# Patient Record
Sex: Female | Born: 1940 | Hispanic: No | Smoking: Former smoker
Health system: Southern US, Community
[De-identification: ages and names within clinical notes are randomized; demographics above are authoritative.]

## PROBLEM LIST (undated history)

## (undated) DIAGNOSIS — I5032 Chronic diastolic (congestive) heart failure: Secondary | ICD-10-CM

## (undated) DIAGNOSIS — M75100 Unspecified rotator cuff tear or rupture of unspecified shoulder, not specified as traumatic: Secondary | ICD-10-CM

## (undated) DIAGNOSIS — D497 Neoplasm of unspecified behavior of endocrine glands and other parts of nervous system: Secondary | ICD-10-CM

## (undated) DIAGNOSIS — Z9889 Other specified postprocedural states: Secondary | ICD-10-CM

## (undated) DIAGNOSIS — M359 Systemic involvement of connective tissue, unspecified: Secondary | ICD-10-CM

## (undated) DIAGNOSIS — R9389 Abnormal findings on diagnostic imaging of other specified body structures: Secondary | ICD-10-CM

## (undated) DIAGNOSIS — K219 Gastro-esophageal reflux disease without esophagitis: Secondary | ICD-10-CM

## (undated) DIAGNOSIS — G4730 Sleep apnea, unspecified: Secondary | ICD-10-CM

## (undated) DIAGNOSIS — H269 Unspecified cataract: Secondary | ICD-10-CM

## (undated) DIAGNOSIS — M1990 Unspecified osteoarthritis, unspecified site: Secondary | ICD-10-CM

## (undated) DIAGNOSIS — M797 Fibromyalgia: Secondary | ICD-10-CM

## (undated) DIAGNOSIS — R7689 Other specified abnormal immunological findings in serum: Secondary | ICD-10-CM

## (undated) DIAGNOSIS — J189 Pneumonia, unspecified organism: Secondary | ICD-10-CM

## (undated) DIAGNOSIS — N893 Dysplasia of vagina, unspecified: Secondary | ICD-10-CM

## (undated) DIAGNOSIS — G43109 Migraine with aura, not intractable, without status migrainosus: Secondary | ICD-10-CM

## (undated) DIAGNOSIS — R011 Cardiac murmur, unspecified: Secondary | ICD-10-CM

## (undated) DIAGNOSIS — I2510 Atherosclerotic heart disease of native coronary artery without angina pectoris: Secondary | ICD-10-CM

## (undated) DIAGNOSIS — Z8719 Personal history of other diseases of the digestive system: Secondary | ICD-10-CM

## (undated) DIAGNOSIS — J45909 Unspecified asthma, uncomplicated: Secondary | ICD-10-CM

## (undated) DIAGNOSIS — I10 Essential (primary) hypertension: Secondary | ICD-10-CM

## (undated) DIAGNOSIS — I4891 Unspecified atrial fibrillation: Secondary | ICD-10-CM

## (undated) DIAGNOSIS — H9190 Unspecified hearing loss, unspecified ear: Secondary | ICD-10-CM

## (undated) DIAGNOSIS — G709 Myoneural disorder, unspecified: Secondary | ICD-10-CM

## (undated) DIAGNOSIS — T8859XA Other complications of anesthesia, initial encounter: Secondary | ICD-10-CM

## (undated) DIAGNOSIS — T4145XA Adverse effect of unspecified anesthetic, initial encounter: Secondary | ICD-10-CM

## (undated) DIAGNOSIS — I639 Cerebral infarction, unspecified: Secondary | ICD-10-CM

## (undated) DIAGNOSIS — C182 Malignant neoplasm of ascending colon: Secondary | ICD-10-CM

## (undated) DIAGNOSIS — C52 Malignant neoplasm of vagina: Secondary | ICD-10-CM

## (undated) DIAGNOSIS — E785 Hyperlipidemia, unspecified: Secondary | ICD-10-CM

## (undated) DIAGNOSIS — G459 Transient cerebral ischemic attack, unspecified: Secondary | ICD-10-CM

## (undated) DIAGNOSIS — G4733 Obstructive sleep apnea (adult) (pediatric): Secondary | ICD-10-CM

## (undated) DIAGNOSIS — T7840XA Allergy, unspecified, initial encounter: Secondary | ICD-10-CM

## (undated) DIAGNOSIS — B029 Zoster without complications: Secondary | ICD-10-CM

## (undated) DIAGNOSIS — R768 Other specified abnormal immunological findings in serum: Secondary | ICD-10-CM

## (undated) DIAGNOSIS — M810 Age-related osteoporosis without current pathological fracture: Secondary | ICD-10-CM

## (undated) DIAGNOSIS — E119 Type 2 diabetes mellitus without complications: Secondary | ICD-10-CM

## (undated) DIAGNOSIS — R112 Nausea with vomiting, unspecified: Secondary | ICD-10-CM

## (undated) HISTORY — DX: Atherosclerotic heart disease of native coronary artery without angina pectoris: I25.10

## (undated) HISTORY — DX: Malignant neoplasm of ascending colon: C18.2

## (undated) HISTORY — DX: Cerebral infarction, unspecified: I63.9

## (undated) HISTORY — DX: Myoneural disorder, unspecified: G70.9

## (undated) HISTORY — DX: Neoplasm of unspecified behavior of endocrine glands and other parts of nervous system: D49.7

## (undated) HISTORY — DX: Unspecified hearing loss, unspecified ear: H91.90

## (undated) HISTORY — DX: Unspecified asthma, uncomplicated: J45.909

## (undated) HISTORY — PX: CARDIAC CATHETERIZATION: SHX172

## (undated) HISTORY — DX: Allergy, unspecified, initial encounter: T78.40XA

## (undated) HISTORY — PX: BUNIONECTOMY: SHX129

## (undated) HISTORY — DX: Age-related osteoporosis without current pathological fracture: M81.0

## (undated) HISTORY — DX: Unspecified cataract: H26.9

## (undated) HISTORY — PX: EYE SURGERY: SHX253

## (undated) HISTORY — DX: Zoster without complications: B02.9

## (undated) HISTORY — DX: Essential (primary) hypertension: I10

## (undated) HISTORY — PX: THYROIDECTOMY, PARTIAL: SHX18

## (undated) HISTORY — DX: Unspecified atrial fibrillation: I48.91

## (undated) HISTORY — DX: Abnormal findings on diagnostic imaging of other specified body structures: R93.89

## (undated) HISTORY — DX: Chronic diastolic (congestive) heart failure: I50.32

## (undated) HISTORY — DX: Fibromyalgia: M79.7

## (undated) HISTORY — DX: Sleep apnea, unspecified: G47.30

## (undated) HISTORY — DX: Hyperlipidemia, unspecified: E78.5

## (undated) HISTORY — DX: Obstructive sleep apnea (adult) (pediatric): G47.33

---

## 1978-01-29 HISTORY — PX: ABDOMINAL HYSTERECTOMY: SHX81

## 1992-01-30 DIAGNOSIS — C52 Malignant neoplasm of vagina: Secondary | ICD-10-CM

## 1992-01-30 HISTORY — PX: VAGINAL MASS EXCISION: SHX2640

## 1992-01-30 HISTORY — DX: Malignant neoplasm of vagina: C52

## 1997-01-29 HISTORY — PX: BREAST BIOPSY: SHX20

## 1999-01-30 HISTORY — PX: ANTERIOR CERVICAL DECOMP/DISCECTOMY FUSION: SHX1161

## 2002-01-29 DIAGNOSIS — J45909 Unspecified asthma, uncomplicated: Secondary | ICD-10-CM

## 2002-01-29 HISTORY — DX: Unspecified asthma, uncomplicated: J45.909

## 2003-06-03 ENCOUNTER — Encounter: Admission: RE | Admit: 2003-06-03 | Discharge: 2003-06-03 | Payer: Self-pay | Admitting: Orthopedic Surgery

## 2003-11-22 ENCOUNTER — Ambulatory Visit: Payer: Self-pay | Admitting: Cardiology

## 2003-12-06 ENCOUNTER — Ambulatory Visit: Payer: Self-pay | Admitting: Cardiology

## 2003-12-14 ENCOUNTER — Encounter: Admission: RE | Admit: 2003-12-14 | Discharge: 2003-12-14 | Payer: Self-pay | Admitting: Internal Medicine

## 2003-12-16 ENCOUNTER — Encounter: Admission: RE | Admit: 2003-12-16 | Discharge: 2003-12-16 | Payer: Self-pay | Admitting: Internal Medicine

## 2003-12-17 ENCOUNTER — Encounter: Admission: RE | Admit: 2003-12-17 | Discharge: 2003-12-17 | Payer: Self-pay | Admitting: Otolaryngology

## 2004-03-30 ENCOUNTER — Ambulatory Visit: Payer: Self-pay | Admitting: Internal Medicine

## 2004-07-27 ENCOUNTER — Ambulatory Visit: Payer: Self-pay | Admitting: Internal Medicine

## 2004-07-28 ENCOUNTER — Ambulatory Visit: Payer: Self-pay | Admitting: Internal Medicine

## 2004-08-04 ENCOUNTER — Encounter: Admission: RE | Admit: 2004-08-04 | Discharge: 2004-08-04 | Payer: Self-pay | Admitting: Internal Medicine

## 2004-08-30 ENCOUNTER — Ambulatory Visit: Payer: Self-pay | Admitting: Gastroenterology

## 2004-09-26 ENCOUNTER — Encounter: Admission: RE | Admit: 2004-09-26 | Discharge: 2004-09-26 | Payer: Self-pay | Admitting: Internal Medicine

## 2004-09-26 ENCOUNTER — Ambulatory Visit: Payer: Self-pay | Admitting: Internal Medicine

## 2004-09-29 ENCOUNTER — Ambulatory Visit: Payer: Self-pay | Admitting: Internal Medicine

## 2004-09-29 LAB — HM COLONOSCOPY

## 2004-10-05 ENCOUNTER — Ambulatory Visit: Payer: Self-pay | Admitting: Gastroenterology

## 2004-10-20 ENCOUNTER — Ambulatory Visit: Payer: Self-pay | Admitting: Internal Medicine

## 2004-12-20 ENCOUNTER — Ambulatory Visit: Payer: Self-pay | Admitting: Internal Medicine

## 2005-01-03 ENCOUNTER — Ambulatory Visit: Payer: Self-pay | Admitting: Pulmonary Disease

## 2005-01-12 ENCOUNTER — Ambulatory Visit: Payer: Self-pay | Admitting: Internal Medicine

## 2005-01-17 ENCOUNTER — Ambulatory Visit: Payer: Self-pay | Admitting: Internal Medicine

## 2005-03-26 ENCOUNTER — Encounter: Admission: RE | Admit: 2005-03-26 | Discharge: 2005-03-26 | Payer: Self-pay | Admitting: Orthopedic Surgery

## 2005-04-05 ENCOUNTER — Encounter: Admission: RE | Admit: 2005-04-05 | Discharge: 2005-04-05 | Payer: Self-pay | Admitting: Internal Medicine

## 2005-04-09 ENCOUNTER — Ambulatory Visit: Payer: Self-pay | Admitting: Internal Medicine

## 2005-05-25 ENCOUNTER — Ambulatory Visit: Payer: Self-pay | Admitting: Internal Medicine

## 2005-11-01 ENCOUNTER — Ambulatory Visit: Payer: Self-pay | Admitting: Internal Medicine

## 2005-11-17 ENCOUNTER — Emergency Department (HOSPITAL_COMMUNITY): Admission: EM | Admit: 2005-11-17 | Discharge: 2005-11-17 | Payer: Self-pay | Admitting: Emergency Medicine

## 2005-12-06 ENCOUNTER — Ambulatory Visit: Payer: Self-pay | Admitting: Internal Medicine

## 2006-01-03 ENCOUNTER — Ambulatory Visit: Payer: Self-pay | Admitting: Internal Medicine

## 2006-01-29 DIAGNOSIS — R9389 Abnormal findings on diagnostic imaging of other specified body structures: Secondary | ICD-10-CM

## 2006-01-29 HISTORY — DX: Abnormal findings on diagnostic imaging of other specified body structures: R93.89

## 2006-02-06 ENCOUNTER — Ambulatory Visit: Payer: Self-pay | Admitting: Internal Medicine

## 2006-05-21 DIAGNOSIS — E785 Hyperlipidemia, unspecified: Secondary | ICD-10-CM | POA: Insufficient documentation

## 2006-05-21 DIAGNOSIS — E119 Type 2 diabetes mellitus without complications: Secondary | ICD-10-CM | POA: Insufficient documentation

## 2006-05-21 DIAGNOSIS — I10 Essential (primary) hypertension: Secondary | ICD-10-CM | POA: Insufficient documentation

## 2006-05-21 DIAGNOSIS — E1169 Type 2 diabetes mellitus with other specified complication: Secondary | ICD-10-CM | POA: Insufficient documentation

## 2006-06-04 ENCOUNTER — Ambulatory Visit: Payer: Self-pay | Admitting: Internal Medicine

## 2006-06-19 ENCOUNTER — Encounter: Admission: RE | Admit: 2006-06-19 | Discharge: 2006-06-19 | Payer: Self-pay | Admitting: Internal Medicine

## 2006-06-20 ENCOUNTER — Ambulatory Visit: Payer: Self-pay | Admitting: Internal Medicine

## 2006-07-03 LAB — CONVERTED CEMR LAB
ALT: 22 units/L (ref 0–40)
AST: 24 units/L (ref 0–37)
BUN: 15 mg/dL (ref 6–23)
Basophils Absolute: 0 10*3/uL (ref 0.0–0.1)
Basophils Relative: 0.4 % (ref 0.0–1.0)
CO2: 31 meq/L (ref 19–32)
Calcium: 9.3 mg/dL (ref 8.4–10.5)
Chloride: 104 meq/L (ref 96–112)
Cholesterol: 173 mg/dL (ref 0–200)
Creatinine, Ser: 0.7 mg/dL (ref 0.4–1.2)
Creatinine,U: 150.9 mg/dL
Eosinophils Absolute: 0.2 10*3/uL (ref 0.0–0.6)
Eosinophils Relative: 3.5 % (ref 0.0–5.0)
GFR calc Af Amer: 108 mL/min
GFR calc non Af Amer: 90 mL/min
Glucose, Bld: 94 mg/dL (ref 70–99)
HCT: 42.1 % (ref 36.0–46.0)
HDL: 69 mg/dL (ref >39.0)
Hemoglobin: 14.1 g/dL (ref 12.0–15.0)
Hgb A1c MFr Bld: 6.6 % — ABNORMAL HIGH (ref 4.6–6.0)
LDL Cholesterol: 85 mg/dL (ref 0–99)
Lymphocytes Relative: 45.4 % (ref 12.0–46.0)
MCHC: 33.4 g/dL (ref 30.0–36.0)
MCV: 85.5 fL (ref 78.0–100.0)
Microalb Creat Ratio: 4.6 mg/g (ref 0.0–30.0)
Microalb, Ur: 0.7 mg/dL (ref 0.0–1.9)
Monocytes Absolute: 0.4 10*3/uL (ref 0.2–0.7)
Monocytes Relative: 8.4 % (ref 3.0–11.0)
Neutro Abs: 1.9 10*3/uL (ref 1.4–7.7)
Neutrophils Relative %: 42.3 % — ABNORMAL LOW (ref 43.0–77.0)
Platelets: 191 10*3/uL (ref 150–400)
Potassium: 3.7 meq/L (ref 3.5–5.1)
RBC: 4.92 M/uL (ref 3.87–5.11)
RDW: 13.8 % (ref 11.5–14.6)
Sodium: 140 meq/L (ref 135–145)
Total CHOL/HDL Ratio: 2.5
Triglycerides: 95 mg/dL (ref 0–149)
VLDL: 19 mg/dL (ref 0–40)
WBC: 4.4 10*3/uL — ABNORMAL LOW (ref 4.5–10.5)

## 2006-10-23 ENCOUNTER — Encounter: Payer: Self-pay | Admitting: Internal Medicine

## 2006-11-12 ENCOUNTER — Ambulatory Visit: Payer: Self-pay | Admitting: Internal Medicine

## 2006-11-12 LAB — CONVERTED CEMR LAB
Bilirubin Urine: NEGATIVE
Glucose, Urine, Semiquant: NEGATIVE
Ketones, urine, test strip: NEGATIVE
Nitrite: NEGATIVE
Protein, U semiquant: NEGATIVE
Specific Gravity, Urine: 1.015
Urobilinogen, UA: NEGATIVE
WBC Urine, dipstick: NEGATIVE
pH: 5

## 2006-11-13 ENCOUNTER — Ambulatory Visit: Payer: Self-pay | Admitting: Internal Medicine

## 2006-11-13 LAB — CONVERTED CEMR LAB
BUN: 14 mg/dL (ref 6–23)
Bacteria, UA: NONE SEEN
Basophils Absolute: 0 10*3/uL (ref 0.0–0.1)
Basophils Relative: 0.6 % (ref 0.0–1.0)
CO2: 29 meq/L (ref 19–32)
Calcium: 9.3 mg/dL (ref 8.4–10.5)
Chloride: 103 meq/L (ref 96–112)
Creatinine, Ser: 0.9 mg/dL (ref 0.4–1.2)
Eosinophils Absolute: 0.1 10*3/uL (ref 0.0–0.6)
Eosinophils Relative: 3 % (ref 0.0–5.0)
GFR calc Af Amer: 81 mL/min
GFR calc non Af Amer: 67 mL/min
Glucose, Bld: 126 mg/dL — ABNORMAL HIGH (ref 70–99)
HCT: 41.2 % (ref 36.0–46.0)
Hemoglobin: 14 g/dL (ref 12.0–15.0)
Lymphocytes Relative: 44.6 % (ref 12.0–46.0)
MCHC: 34 g/dL (ref 30.0–36.0)
MCV: 83.6 fL (ref 78.0–100.0)
Monocytes Absolute: 0.4 10*3/uL (ref 0.2–0.7)
Monocytes Relative: 8.7 % (ref 3.0–11.0)
Neutro Abs: 1.8 10*3/uL (ref 1.4–7.7)
Neutrophils Relative %: 43.1 % (ref 43.0–77.0)
Platelets: 169 10*3/uL (ref 150–400)
Potassium: 3.9 meq/L (ref 3.5–5.1)
RBC / HPF: NONE SEEN (ref <3)
RBC: 4.93 M/uL (ref 3.87–5.11)
RDW: 13.3 % (ref 11.5–14.6)
Sodium: 140 meq/L (ref 135–145)
WBC, UA: NONE SEEN cells/hpf (ref <3)
WBC: 4.1 10*3/uL — ABNORMAL LOW (ref 4.5–10.5)

## 2006-11-14 ENCOUNTER — Encounter: Payer: Self-pay | Admitting: Internal Medicine

## 2006-11-14 ENCOUNTER — Telehealth (INDEPENDENT_AMBULATORY_CARE_PROVIDER_SITE_OTHER): Payer: Self-pay | Admitting: *Deleted

## 2006-11-16 ENCOUNTER — Emergency Department (HOSPITAL_COMMUNITY): Admission: EM | Admit: 2006-11-16 | Discharge: 2006-11-16 | Payer: Self-pay | Admitting: Emergency Medicine

## 2006-11-19 ENCOUNTER — Ambulatory Visit: Payer: Self-pay | Admitting: Cardiovascular Disease

## 2006-11-19 ENCOUNTER — Ambulatory Visit: Payer: Self-pay | Admitting: Internal Medicine

## 2006-11-19 DIAGNOSIS — R5383 Other fatigue: Secondary | ICD-10-CM | POA: Insufficient documentation

## 2006-11-19 DIAGNOSIS — R079 Chest pain, unspecified: Secondary | ICD-10-CM | POA: Insufficient documentation

## 2006-11-19 DIAGNOSIS — R5381 Other malaise: Secondary | ICD-10-CM | POA: Insufficient documentation

## 2006-11-19 LAB — CONVERTED CEMR LAB: Hemoglobin: 13.6 g/dL

## 2006-12-06 ENCOUNTER — Ambulatory Visit: Payer: Self-pay | Admitting: Cardiology

## 2006-12-16 ENCOUNTER — Ambulatory Visit: Payer: Self-pay

## 2006-12-16 ENCOUNTER — Encounter: Payer: Self-pay | Admitting: Cardiology

## 2006-12-16 ENCOUNTER — Ambulatory Visit: Payer: Self-pay | Admitting: Cardiology

## 2006-12-16 ENCOUNTER — Encounter: Payer: Self-pay | Admitting: Internal Medicine

## 2006-12-23 ENCOUNTER — Ambulatory Visit: Payer: Self-pay | Admitting: Cardiology

## 2007-05-07 ENCOUNTER — Telehealth (INDEPENDENT_AMBULATORY_CARE_PROVIDER_SITE_OTHER): Payer: Self-pay | Admitting: *Deleted

## 2007-05-07 ENCOUNTER — Ambulatory Visit: Payer: Self-pay | Admitting: Internal Medicine

## 2007-05-07 ENCOUNTER — Observation Stay (HOSPITAL_COMMUNITY): Admission: EM | Admit: 2007-05-07 | Discharge: 2007-05-09 | Payer: Self-pay | Admitting: Emergency Medicine

## 2007-05-23 ENCOUNTER — Ambulatory Visit: Payer: Self-pay | Admitting: Internal Medicine

## 2007-05-25 ENCOUNTER — Encounter (INDEPENDENT_AMBULATORY_CARE_PROVIDER_SITE_OTHER): Payer: Self-pay | Admitting: *Deleted

## 2007-05-25 LAB — CONVERTED CEMR LAB
ALT: 21 units/L (ref 0–35)
AST: 20 units/L (ref 0–37)
Hgb A1c MFr Bld: 6.5 % — ABNORMAL HIGH (ref 4.6–6.1)

## 2007-06-30 ENCOUNTER — Encounter (INDEPENDENT_AMBULATORY_CARE_PROVIDER_SITE_OTHER): Payer: Self-pay | Admitting: *Deleted

## 2007-07-03 ENCOUNTER — Encounter: Payer: Self-pay | Admitting: Internal Medicine

## 2007-07-22 ENCOUNTER — Telehealth: Payer: Self-pay | Admitting: Internal Medicine

## 2007-07-29 ENCOUNTER — Encounter: Admission: RE | Admit: 2007-07-29 | Discharge: 2007-07-29 | Payer: Self-pay | Admitting: Internal Medicine

## 2007-07-30 ENCOUNTER — Encounter: Admission: RE | Admit: 2007-07-30 | Discharge: 2007-07-30 | Payer: Self-pay | Admitting: Internal Medicine

## 2007-07-30 ENCOUNTER — Encounter: Payer: Self-pay | Admitting: Internal Medicine

## 2007-08-04 ENCOUNTER — Telehealth (INDEPENDENT_AMBULATORY_CARE_PROVIDER_SITE_OTHER): Payer: Self-pay | Admitting: *Deleted

## 2007-08-06 ENCOUNTER — Encounter: Admission: RE | Admit: 2007-08-06 | Discharge: 2007-08-06 | Payer: Self-pay | Admitting: Internal Medicine

## 2007-08-11 ENCOUNTER — Encounter (INDEPENDENT_AMBULATORY_CARE_PROVIDER_SITE_OTHER): Payer: Self-pay | Admitting: *Deleted

## 2007-08-15 ENCOUNTER — Ambulatory Visit: Payer: Self-pay | Admitting: Internal Medicine

## 2007-08-15 DIAGNOSIS — M797 Fibromyalgia: Secondary | ICD-10-CM | POA: Insufficient documentation

## 2007-09-16 ENCOUNTER — Telehealth (INDEPENDENT_AMBULATORY_CARE_PROVIDER_SITE_OTHER): Payer: Self-pay | Admitting: *Deleted

## 2007-09-16 ENCOUNTER — Ambulatory Visit: Payer: Self-pay | Admitting: Internal Medicine

## 2007-09-19 ENCOUNTER — Telehealth: Payer: Self-pay | Admitting: Internal Medicine

## 2007-09-19 LAB — CONVERTED CEMR LAB
BUN: 17 mg/dL (ref 6–23)
Basophils Absolute: 0.1 10*3/uL (ref 0.0–0.1)
Basophils Relative: 1.2 % (ref 0.0–3.0)
CO2: 29 meq/L (ref 19–32)
Calcium: 9.5 mg/dL (ref 8.4–10.5)
Chloride: 105 meq/L (ref 96–112)
Creatinine, Ser: 0.9 mg/dL (ref 0.4–1.2)
Eosinophils Absolute: 0.1 10*3/uL (ref 0.0–0.7)
Eosinophils Relative: 2.5 % (ref 0.0–5.0)
GFR calc Af Amer: 81 mL/min
GFR calc non Af Amer: 67 mL/min
Glucose, Bld: 91 mg/dL (ref 70–99)
HCT: 38 % (ref 36.0–46.0)
Hemoglobin: 13.3 g/dL (ref 12.0–15.0)
Hgb A1c MFr Bld: 6.5 % — ABNORMAL HIGH (ref 4.6–6.0)
Lymphocytes Relative: 41.9 % (ref 12.0–46.0)
MCHC: 35.1 g/dL (ref 30.0–36.0)
MCV: 86.4 fL (ref 78.0–100.0)
Monocytes Absolute: 0.1 10*3/uL (ref 0.1–1.0)
Monocytes Relative: 3.2 % (ref 3.0–12.0)
Neutro Abs: 2.2 10*3/uL (ref 1.4–7.7)
Neutrophils Relative %: 51.2 % (ref 43.0–77.0)
Platelets: 166 10*3/uL (ref 150–400)
Potassium: 3.7 meq/L (ref 3.5–5.1)
RBC: 4.4 M/uL (ref 3.87–5.11)
RDW: 13 % (ref 11.5–14.6)
Sodium: 140 meq/L (ref 135–145)
TSH: 2.55 microintl units/mL (ref 0.35–5.50)
WBC: 4.3 10*3/uL — ABNORMAL LOW (ref 4.5–10.5)

## 2007-09-25 ENCOUNTER — Ambulatory Visit: Payer: Self-pay | Admitting: Internal Medicine

## 2007-09-26 ENCOUNTER — Encounter (INDEPENDENT_AMBULATORY_CARE_PROVIDER_SITE_OTHER): Payer: Self-pay | Admitting: *Deleted

## 2007-09-30 ENCOUNTER — Telehealth (INDEPENDENT_AMBULATORY_CARE_PROVIDER_SITE_OTHER): Payer: Self-pay | Admitting: *Deleted

## 2007-09-30 DIAGNOSIS — G4733 Obstructive sleep apnea (adult) (pediatric): Secondary | ICD-10-CM

## 2007-09-30 HISTORY — DX: Obstructive sleep apnea (adult) (pediatric): G47.33

## 2007-09-30 LAB — CONVERTED CEMR LAB
Cholesterol: 182 mg/dL (ref 0–200)
HDL: 62 mg/dL (ref >39.0)
LDL Cholesterol: 104 mg/dL — ABNORMAL HIGH (ref 0–99)
Total CHOL/HDL Ratio: 2.9
Triglycerides: 78 mg/dL (ref 0–149)
VLDL: 16 mg/dL (ref 0–40)

## 2007-10-15 ENCOUNTER — Ambulatory Visit: Payer: Self-pay | Admitting: Pulmonary Disease

## 2007-10-15 DIAGNOSIS — G4733 Obstructive sleep apnea (adult) (pediatric): Secondary | ICD-10-CM | POA: Insufficient documentation

## 2007-11-11 ENCOUNTER — Encounter: Payer: Self-pay | Admitting: Pulmonary Disease

## 2007-11-11 ENCOUNTER — Ambulatory Visit (HOSPITAL_BASED_OUTPATIENT_CLINIC_OR_DEPARTMENT_OTHER): Admission: RE | Admit: 2007-11-11 | Discharge: 2007-11-11 | Payer: Self-pay | Admitting: Pulmonary Disease

## 2007-11-28 ENCOUNTER — Ambulatory Visit: Payer: Self-pay | Admitting: Pulmonary Disease

## 2007-12-02 ENCOUNTER — Encounter: Payer: Self-pay | Admitting: Pulmonary Disease

## 2007-12-03 ENCOUNTER — Ambulatory Visit: Payer: Self-pay | Admitting: Pulmonary Disease

## 2007-12-03 ENCOUNTER — Telehealth (INDEPENDENT_AMBULATORY_CARE_PROVIDER_SITE_OTHER): Payer: Self-pay | Admitting: *Deleted

## 2008-08-13 ENCOUNTER — Telehealth: Payer: Self-pay | Admitting: Internal Medicine

## 2008-08-19 ENCOUNTER — Encounter: Payer: Self-pay | Admitting: Internal Medicine

## 2008-08-25 ENCOUNTER — Encounter: Payer: Self-pay | Admitting: Internal Medicine

## 2008-09-28 ENCOUNTER — Ambulatory Visit: Payer: Self-pay | Admitting: Family Medicine

## 2008-09-28 DIAGNOSIS — N390 Urinary tract infection, site not specified: Secondary | ICD-10-CM | POA: Insufficient documentation

## 2008-09-28 DIAGNOSIS — N3000 Acute cystitis without hematuria: Secondary | ICD-10-CM | POA: Insufficient documentation

## 2008-09-28 LAB — CONVERTED CEMR LAB
Bilirubin Urine: NEGATIVE
Glucose, Urine, Semiquant: NEGATIVE
Ketones, urine, test strip: NEGATIVE
Nitrite: POSITIVE
Protein, U semiquant: NEGATIVE
Specific Gravity, Urine: 1.02
Urobilinogen, UA: 0.2
WBC Urine, dipstick: NEGATIVE
pH: 5

## 2008-09-29 ENCOUNTER — Encounter: Payer: Self-pay | Admitting: Family Medicine

## 2008-09-29 LAB — CONVERTED CEMR LAB: RBC / HPF: NONE SEEN (ref <3)

## 2008-10-20 ENCOUNTER — Ambulatory Visit: Payer: Self-pay | Admitting: Internal Medicine

## 2008-10-20 DIAGNOSIS — R319 Hematuria, unspecified: Secondary | ICD-10-CM | POA: Insufficient documentation

## 2008-10-20 LAB — CONVERTED CEMR LAB
Ketones, urine, test strip: NEGATIVE
Nitrite: NEGATIVE
Protein, U semiquant: NEGATIVE
Urobilinogen, UA: 0.2

## 2008-10-28 ENCOUNTER — Telehealth (INDEPENDENT_AMBULATORY_CARE_PROVIDER_SITE_OTHER): Payer: Self-pay | Admitting: *Deleted

## 2008-10-29 ENCOUNTER — Ambulatory Visit: Payer: Self-pay | Admitting: Internal Medicine

## 2008-11-08 LAB — CONVERTED CEMR LAB
ALT: 20 units/L (ref 0–35)
BUN: 13 mg/dL (ref 6–23)
Chloride: 107 meq/L (ref 96–112)
Glucose, Bld: 95 mg/dL (ref 70–99)
HDL: 64.2 mg/dL (ref >39.00)
LDL Cholesterol: 58 mg/dL (ref 0–99)
Potassium: 3.6 meq/L (ref 3.5–5.1)
Sodium: 142 meq/L (ref 135–145)
Total CHOL/HDL Ratio: 2
VLDL: 10.8 mg/dL (ref 0.0–40.0)

## 2008-11-22 ENCOUNTER — Telehealth: Payer: Self-pay | Admitting: Cardiology

## 2008-11-22 ENCOUNTER — Telehealth: Payer: Self-pay | Admitting: Internal Medicine

## 2008-11-23 ENCOUNTER — Ambulatory Visit: Payer: Self-pay | Admitting: Internal Medicine

## 2008-11-23 ENCOUNTER — Telehealth: Payer: Self-pay | Admitting: Internal Medicine

## 2008-11-23 DIAGNOSIS — R002 Palpitations: Secondary | ICD-10-CM | POA: Insufficient documentation

## 2008-11-23 LAB — CONVERTED CEMR LAB
BUN: 17 mg/dL (ref 6–23)
Basophils Absolute: 0 10*3/uL (ref 0.0–0.1)
Basophils Relative: 0.6 % (ref 0.0–3.0)
Chloride: 102 meq/L (ref 96–112)
Glucose, Bld: 80 mg/dL (ref 70–99)
HCT: 39.7 % (ref 36.0–46.0)
Hemoglobin: 13.3 g/dL (ref 12.0–15.0)
Lymphocytes Relative: 42.6 % (ref 12.0–46.0)
Lymphs Abs: 1.4 10*3/uL (ref 0.7–4.0)
MCHC: 33.5 g/dL (ref 30.0–36.0)
Monocytes Relative: 8.3 % (ref 3.0–12.0)
Neutro Abs: 1.6 10*3/uL (ref 1.4–7.7)
Potassium: 3.8 meq/L (ref 3.5–5.1)
RBC: 4.59 M/uL (ref 3.87–5.11)
RDW: 13.3 % (ref 11.5–14.6)
Sodium: 139 meq/L (ref 135–145)
TSH: 2.07 microintl units/mL (ref 0.35–5.50)

## 2008-11-24 ENCOUNTER — Ambulatory Visit: Payer: Self-pay | Admitting: Cardiology

## 2008-11-24 ENCOUNTER — Telehealth: Payer: Self-pay | Admitting: Internal Medicine

## 2008-11-26 ENCOUNTER — Telehealth: Payer: Self-pay | Admitting: Internal Medicine

## 2008-11-30 ENCOUNTER — Telehealth: Payer: Self-pay | Admitting: Internal Medicine

## 2008-12-06 DIAGNOSIS — Z87891 Personal history of nicotine dependence: Secondary | ICD-10-CM | POA: Insufficient documentation

## 2008-12-06 DIAGNOSIS — R0989 Other specified symptoms and signs involving the circulatory and respiratory systems: Secondary | ICD-10-CM

## 2008-12-06 DIAGNOSIS — R0609 Other forms of dyspnea: Secondary | ICD-10-CM | POA: Insufficient documentation

## 2008-12-16 ENCOUNTER — Ambulatory Visit: Payer: Self-pay | Admitting: Internal Medicine

## 2008-12-22 ENCOUNTER — Ambulatory Visit: Payer: Self-pay | Admitting: Cardiology

## 2009-01-06 ENCOUNTER — Ambulatory Visit (HOSPITAL_COMMUNITY): Admission: RE | Admit: 2009-01-06 | Discharge: 2009-01-06 | Payer: Self-pay | Admitting: Cardiology

## 2009-01-06 ENCOUNTER — Ambulatory Visit: Payer: Self-pay | Admitting: Cardiology

## 2009-01-06 ENCOUNTER — Encounter: Payer: Self-pay | Admitting: Cardiology

## 2009-01-06 ENCOUNTER — Ambulatory Visit: Payer: Self-pay

## 2009-03-18 ENCOUNTER — Ambulatory Visit: Payer: Self-pay | Admitting: Internal Medicine

## 2009-04-15 ENCOUNTER — Telehealth: Payer: Self-pay | Admitting: Family Medicine

## 2009-04-21 ENCOUNTER — Ambulatory Visit: Payer: Self-pay | Admitting: Internal Medicine

## 2009-05-27 ENCOUNTER — Telehealth (INDEPENDENT_AMBULATORY_CARE_PROVIDER_SITE_OTHER): Payer: Self-pay | Admitting: *Deleted

## 2009-05-27 ENCOUNTER — Ambulatory Visit: Payer: Self-pay | Admitting: Internal Medicine

## 2009-06-03 ENCOUNTER — Ambulatory Visit: Payer: Self-pay | Admitting: Pulmonary Disease

## 2009-06-15 ENCOUNTER — Ambulatory Visit: Payer: Self-pay | Admitting: Internal Medicine

## 2009-06-17 ENCOUNTER — Telehealth: Payer: Self-pay | Admitting: Internal Medicine

## 2009-06-18 LAB — CONVERTED CEMR LAB
ALT: 22 units/L (ref 0–35)
BUN: 18 mg/dL (ref 6–23)
CO2: 32 meq/L (ref 19–32)
Chloride: 101 meq/L (ref 96–112)
Creatinine, Ser: 0.9 mg/dL (ref 0.4–1.2)
Glucose, Bld: 111 mg/dL — ABNORMAL HIGH (ref 70–99)
Potassium: 4 meq/L (ref 3.5–5.1)
Total CK: 513 units/L — ABNORMAL HIGH (ref 7–177)

## 2009-06-20 ENCOUNTER — Telehealth (INDEPENDENT_AMBULATORY_CARE_PROVIDER_SITE_OTHER): Payer: Self-pay | Admitting: *Deleted

## 2009-06-20 ENCOUNTER — Emergency Department (HOSPITAL_COMMUNITY): Admission: EM | Admit: 2009-06-20 | Discharge: 2009-06-21 | Payer: Self-pay | Admitting: Emergency Medicine

## 2009-06-21 ENCOUNTER — Ambulatory Visit: Payer: Self-pay | Admitting: Internal Medicine

## 2009-06-30 ENCOUNTER — Encounter: Payer: Self-pay | Admitting: Internal Medicine

## 2009-07-06 ENCOUNTER — Ambulatory Visit: Payer: Self-pay | Admitting: Internal Medicine

## 2009-07-06 LAB — CONVERTED CEMR LAB
Basophils Absolute: 0 10*3/uL (ref 0.0–0.1)
Basophils Relative: 1 % (ref 0.0–3.0)
CO2: 30 meq/L (ref 19–32)
Calcium: 9.4 mg/dL (ref 8.4–10.5)
Chloride: 104 meq/L (ref 96–112)
Eosinophils Absolute: 0.1 10*3/uL (ref 0.0–0.7)
Glucose, Bld: 102 mg/dL — ABNORMAL HIGH (ref 70–99)
HCT: 38.7 % (ref 36.0–46.0)
Hemoglobin: 13.1 g/dL (ref 12.0–15.0)
Lymphocytes Relative: 39.1 % (ref 12.0–46.0)
Lymphs Abs: 1.7 10*3/uL (ref 0.7–4.0)
MCHC: 33.8 g/dL (ref 30.0–36.0)
MCV: 86.3 fL (ref 78.0–100.0)
Neutro Abs: 2.3 10*3/uL (ref 1.4–7.7)
Potassium: 4 meq/L (ref 3.5–5.1)
RBC: 4.48 M/uL (ref 3.87–5.11)
RDW: 14.1 % (ref 11.5–14.6)
Sodium: 141 meq/L (ref 135–145)

## 2009-07-08 ENCOUNTER — Encounter: Payer: Self-pay | Admitting: Internal Medicine

## 2009-07-12 ENCOUNTER — Ambulatory Visit: Payer: Self-pay | Admitting: Internal Medicine

## 2009-07-13 ENCOUNTER — Ambulatory Visit: Payer: Self-pay | Admitting: Pulmonary Disease

## 2009-07-14 ENCOUNTER — Telehealth (INDEPENDENT_AMBULATORY_CARE_PROVIDER_SITE_OTHER): Payer: Self-pay | Admitting: *Deleted

## 2009-07-14 LAB — CONVERTED CEMR LAB: Total CK: 438 units/L — ABNORMAL HIGH (ref 7–177)

## 2009-07-19 ENCOUNTER — Ambulatory Visit: Payer: Self-pay | Admitting: Internal Medicine

## 2009-07-19 LAB — CONVERTED CEMR LAB
Bilirubin Urine: NEGATIVE
Ketones, urine, test strip: NEGATIVE
Specific Gravity, Urine: 1.01
Urobilinogen, UA: 0.2

## 2009-07-20 ENCOUNTER — Encounter: Payer: Self-pay | Admitting: Internal Medicine

## 2009-07-20 ENCOUNTER — Ambulatory Visit: Payer: Self-pay | Admitting: Internal Medicine

## 2009-07-20 LAB — CONVERTED CEMR LAB
Casts: NONE SEEN /lpf
Crystals: NONE SEEN
RBC / HPF: NONE SEEN (ref <3)
Squamous Epithelial / LPF: NONE SEEN /lpf

## 2009-08-08 ENCOUNTER — Ambulatory Visit: Payer: Self-pay | Admitting: Internal Medicine

## 2009-08-09 ENCOUNTER — Encounter: Payer: Self-pay | Admitting: Internal Medicine

## 2009-08-09 LAB — CONVERTED CEMR LAB
Casts: NONE SEEN /lpf
RBC / HPF: NONE SEEN (ref <3)
Squamous Epithelial / LPF: NONE SEEN /lpf
WBC, UA: NONE SEEN cells/hpf (ref <3)

## 2009-08-23 ENCOUNTER — Encounter: Payer: Self-pay | Admitting: Internal Medicine

## 2009-08-24 ENCOUNTER — Ambulatory Visit: Payer: Self-pay | Admitting: Internal Medicine

## 2009-08-25 ENCOUNTER — Encounter: Payer: Self-pay | Admitting: Gastroenterology

## 2009-09-09 ENCOUNTER — Telehealth (INDEPENDENT_AMBULATORY_CARE_PROVIDER_SITE_OTHER): Payer: Self-pay | Admitting: *Deleted

## 2009-09-13 ENCOUNTER — Ambulatory Visit: Payer: Self-pay | Admitting: Internal Medicine

## 2009-09-13 LAB — CONVERTED CEMR LAB
Bilirubin Urine: NEGATIVE
Nitrite: NEGATIVE
Protein, U semiquant: NEGATIVE
Specific Gravity, Urine: 1.01

## 2009-09-14 ENCOUNTER — Encounter: Payer: Self-pay | Admitting: Internal Medicine

## 2009-09-14 LAB — CONVERTED CEMR LAB

## 2009-09-16 ENCOUNTER — Encounter: Admission: RE | Admit: 2009-09-16 | Discharge: 2009-09-16 | Payer: Self-pay | Admitting: Internal Medicine

## 2009-09-23 ENCOUNTER — Telehealth: Payer: Self-pay | Admitting: Internal Medicine

## 2009-09-29 ENCOUNTER — Emergency Department (HOSPITAL_COMMUNITY): Admission: EM | Admit: 2009-09-29 | Discharge: 2009-09-29 | Payer: Self-pay | Admitting: Family Medicine

## 2009-10-05 ENCOUNTER — Ambulatory Visit: Payer: Self-pay | Admitting: Internal Medicine

## 2009-10-05 DIAGNOSIS — M542 Cervicalgia: Secondary | ICD-10-CM | POA: Insufficient documentation

## 2009-10-12 LAB — CONVERTED CEMR LAB
Hgb A1c MFr Bld: 6.5 % (ref 4.6–6.5)
Total CK: 331 units/L — ABNORMAL HIGH (ref 7–177)

## 2009-10-14 ENCOUNTER — Encounter: Payer: Self-pay | Admitting: Internal Medicine

## 2009-10-15 ENCOUNTER — Encounter: Payer: Self-pay | Admitting: Pulmonary Disease

## 2009-10-24 ENCOUNTER — Telehealth (INDEPENDENT_AMBULATORY_CARE_PROVIDER_SITE_OTHER): Payer: Self-pay | Admitting: *Deleted

## 2009-11-02 ENCOUNTER — Telehealth: Payer: Self-pay | Admitting: Internal Medicine

## 2009-11-02 ENCOUNTER — Encounter: Payer: Self-pay | Admitting: Internal Medicine

## 2009-11-03 ENCOUNTER — Telehealth: Payer: Self-pay | Admitting: Pulmonary Disease

## 2009-11-11 ENCOUNTER — Encounter: Payer: Self-pay | Admitting: Internal Medicine

## 2009-11-15 ENCOUNTER — Encounter: Admission: RE | Admit: 2009-11-15 | Discharge: 2009-12-09 | Payer: Self-pay | Admitting: Orthopedic Surgery

## 2009-11-18 ENCOUNTER — Ambulatory Visit: Payer: Self-pay | Admitting: Internal Medicine

## 2009-11-29 DIAGNOSIS — B029 Zoster without complications: Secondary | ICD-10-CM

## 2009-11-29 HISTORY — DX: Zoster without complications: B02.9

## 2009-12-02 ENCOUNTER — Telehealth: Payer: Self-pay | Admitting: Internal Medicine

## 2009-12-09 ENCOUNTER — Encounter: Payer: Self-pay | Admitting: Internal Medicine

## 2009-12-17 ENCOUNTER — Emergency Department (HOSPITAL_COMMUNITY): Admission: EM | Admit: 2009-12-17 | Discharge: 2009-12-17 | Payer: Self-pay | Admitting: Family Medicine

## 2009-12-28 ENCOUNTER — Encounter: Payer: Self-pay | Admitting: Cardiology

## 2009-12-28 ENCOUNTER — Ambulatory Visit: Payer: Self-pay | Admitting: Cardiology

## 2010-01-02 ENCOUNTER — Ambulatory Visit: Payer: Self-pay | Admitting: Internal Medicine

## 2010-01-02 DIAGNOSIS — B0229 Other postherpetic nervous system involvement: Secondary | ICD-10-CM | POA: Insufficient documentation

## 2010-01-05 ENCOUNTER — Ambulatory Visit: Payer: Self-pay | Admitting: Cardiology

## 2010-01-06 LAB — CONVERTED CEMR LAB
Calcium: 9.4 mg/dL (ref 8.4–10.5)
GFR calc non Af Amer: 89.25 mL/min (ref >60.00)
Glucose, Bld: 110 mg/dL — ABNORMAL HIGH (ref 70–99)
Sodium: 139 meq/L (ref 135–145)

## 2010-01-25 ENCOUNTER — Ambulatory Visit: Payer: Self-pay | Admitting: Cardiology

## 2010-01-29 HISTORY — PX: CATARACT EXTRACTION W/ INTRAOCULAR LENS  IMPLANT, BILATERAL: SHX1307

## 2010-02-20 ENCOUNTER — Encounter: Payer: Self-pay | Admitting: Internal Medicine

## 2010-02-28 NOTE — Progress Notes (Signed)
Summary: swelling feet//seen 06/15/09  Phone Note Call from Patient Call back at Home Phone [phone]   Caller: Patient Summary of Call: pt c/o swelling says real bad at night time, when waking up in am swelling goes down. -After moving around feet starts swelling  during day, feet feels prickly and heavy, -Taking Hctz 25 mg 1/2 once daily, Etodolac once daily, pt says not taking Lyrica today. Seen 06/15/09 please advise Initial call taken by: Kandice Hams,  Jun 17, 2009 3:36 PM  Follow-up for Phone Call        elevate legs 30 min two times a day DM well controlled CKs elevated: hold zocor x 3 weeks recheck a CK in 3 weeks (dx myalgias) if edema continue, OV in 3 weeks  Zanaya Baize E. Lilias Lorensen MD  Jun 17, 2009 5:07 PM   discussed with pt........Marland KitchenShary Decamp  Jun 17, 2009 5:11 PM

## 2010-02-28 NOTE — Consult Note (Signed)
Summary: cervical spondylosis, stenosis--- [facility]   Imported By: Lanelle Bal 11/15/2009 15:28:51  _____________________________________________________________________  External Attachment:    Type:   Image     Comment:   External Document

## 2010-02-28 NOTE — Letter (Signed)
Summary: cysto (-), d/c qd ABX, cont  hormone cream---[facility]   Imported By: Lanelle Bal 12/23/2009 09:50:59  _____________________________________________________________________  External Attachment:    Type:   Image     Comment:   External Document

## 2010-02-28 NOTE — Assessment & Plan Note (Signed)
Summary: sinus infection//kn   Vital Signs:  Patient profile:   70 year old female Weight:      184.50 pounds O2 Sat:      98 % on Room air Temp:     98.0 degrees F oral Pulse rate:   78 / minute Pulse rhythm:   regular BP sitting:   130 / 80  (left arm) Cuff size:   regular  Vitals Entered By: Army Fossa CMA (November 18, 2009 11:20 AM)  O2 Flow:  Room air CC: Pt here c/o sinus infection?  Comments sore throat sinus drainage chest congestion  x 2-3 days [facility]   History of Present Illness: AS ABOVE  some wheezing, albuterol helps   ROS + subjective fever some PN drip  no N-V some upper ext. myalgias + small amount of green sputum   Current Medications (verified): 1)  Aspirin 81 Mg Tbec (Aspirin) .... Take One Tablet By Mouth Daily 2)  Bystolic 10 Mg  Tabs (Nebivolol Hcl) .... One Tablet Daily 3)  Hydrochlorothiazide 25 Mg Tabs (Hydrochlorothiazide) .... Take Half  Tablet By Mouth Once A Day 4)  Metformin Hcl 500 Mg Xr24h-Tab (Metformin Hcl) .... One By Mouth Daily 5)  Lyrica 100 Mg  Caps (Pregabalin) .[name].. 1 By Mouth At Bedtime 6)  Etodolac Cr 400 Mg  Tb24 (Etodolac) .... One By Mouth Daily 7)  Potassium 99 Mg Tabs (Potassium) .[name].. 1 Tab Once Daily 8)  Multivitamins   Tabs (Multiple Vitamin) .[name].. 1 Tab Once Daily 9)  Glucosamine Chondr 1500 Complx   Caps (Glucosamine-Chondroit-Vit C-Mn) .[name].. 1 Tab Once Daily 10)  Prilosec Otc 20 Mg Tbec (Omeprazole Magnesium) .... Take One 30-60 Min Before First Meal of The Day 11)  Cpap .... Wear At Bedtime 12)  Ventolin Hfa 108 (90 Base) Mcg/act Aers (Albuterol Sulfate) .... 2 Puffs Every 4 Hours As Needed Wheezing and Coughing 13)  Macrodantin 50 Mg Caps (Nitrofurantoin Macrocrystal) .... Once Daily For 1 Month 14)  Robaxin 500 Mg Tabs (Methocarbamol) .[name].. 1 By Mouth Three Times A Day As Needed 15)  Norco 5-325 Mg Tabs (Hydrocodone-Acetaminophen) .... As Needed  Allergies (verified): 1)  ! * Tramadol 2)  *  Seldane 3)  Ceftin (Cefuroxime Axetil) 4)  * Hycotuss Expectorant (Hydrocodone-Guaifenesin)  Past History:  Past Medical History: Reviewed history from 08/24/2009 and no changes required. 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 and CT chest. Had a 9 beat NSVT Fibromyalgia  abnormal CT of the chest in 2008, follow-up CT , April 2009: Unchanged.  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 partial thyroidectomy in the 60s Cervical laminectomy, in [place] , 2001  Hysterectomy Oophorectomy foot surgery neg cardiolite 10/05 barium swallow neg 2/04 EGD neg 9/06  Social History: Reviewed history from 07/06/2009 and no changes required. Married on disability since 2000 pt has children x 2  Patient states former smoker , quit in the late 90s.  ETOH--no  Physical Exam  General:  alert and well-developed.  NAD  Head:  face symetric, non tender to palpation  Ears:  R ear normal and L ear normal.   Nose:  congested  Mouth:  no red or d/c  Lungs:  normal respiratory effort, no intercostal retractions, no accessory muscle use, and normal breath sounds.   Heart:  normal rate, regular rhythm, and no murmur.     Impression & Recommendations:  Problem # 1:  URI (ICD-465.9)  see instructions  Her updated medication list for this problem includes:    Aspirin 81 Mg Tbec (Aspirin) .[name]... Take one tablet by mouth daily    Etodolac Cr 400 Mg Tb24 (Etodolac) ..... One by mouth daily  Orders: Rapid Strep (60454)  Complete Medication List: 1)  Aspirin 81 Mg Tbec (Aspirin) .... Take one tablet by  mouth daily 2)  Bystolic 10 Mg Tabs (Nebivolol hcl) .... One tablet daily 3)  Hydrochlorothiazide 25 Mg Tabs (Hydrochlorothiazide) .... Take half  tablet by mouth once a day 4)  Metformin Hcl 500 Mg Xr24h-tab (Metformin hcl) .... One by mouth daily 5)  Lyrica 100 Mg Caps (Pregabalin) .[name].. 1 by mouth at bedtime 6)  Etodolac Cr 400 Mg Tb24 (Etodolac) .... One by mouth daily 7)  Potassium 99 Mg Tabs (Potassium) .[name].. 1 tab once daily 8)  Multivitamins Tabs (Multiple vitamin) .[name].. 1 tab once daily 9)  Glucosamine Chondr 1500 Complx Caps (Glucosamine-chondroit-vit c-mn) .[name].. 1 tab once daily 10)  Prilosec Otc 20 Mg Tbec (Omeprazole magnesium) .... Take one 30-60 min before first meal of the day 11)  Cpap  .... Wear at bedtime 12)  Ventolin Hfa 108 (90 Base) Mcg/act Aers (Albuterol sulfate) .... 2 puffs every 4 hours as needed wheezing and coughing 13)  Macrodantin 50 Mg Caps (Nitrofurantoin macrocrystal) .... Once daily for 1 month 14)  Robaxin 500 Mg Tabs (Methocarbamol) .[name].. 1 by mouth three times a day as needed 15)  Norco 5-325 Mg Tabs (Hydrocodone-acetaminophen) .... As needed  Patient Instructions: 1)  rest 2)  fluids 3)  tylenol 4)  mucinex DM or robitussin DM--- OTC, as needed  5)  albuterol as needed for wheezing 6)  call if no better in few days 7)  call if symptoms severe , high fever, short of breath    Orders Added: 1)  Rapid Strep [09811] 2)  Est. Patient Level III [91478]    Laboratory Results    Other Tests  Rapid Strep: negative Comments: Army Fossa CMA  November 18, 2009 11:22 AM

## 2010-02-28 NOTE — Assessment & Plan Note (Signed)
Summary: follow-up with Dr. Salomon Mast   Vital Signs:  Patient profile:   70 year old female Height:      63 inches Weight:      186 pounds Pulse rate:   70 / minute BP sitting:   112 / 80  Vitals Entered By: Shary Decamp (Jun 21, 2009 1:37 PM) CC: rov, pt dx pt with UTI - on bactrim (urine was cx but is still pending in e-chart)   History of Present Illness: patient went to the ER yesterday complaining of weakness, aches, swelling, diarrhea and fatigue Workup at the ER 06-20-09 was  as follows:  WBCs 4.3, hemoglobin 13.4, platelets 157 Potassium 3.8, creatinine 0.9 Total CK 489 Hemoccults negative Cardiac enzymes negative x1 Urinalysis showed many bacteria, was prescribed Bactrim, urine culture pending Vital signs: BP 122/80, pulse 92  Allergies: 1)  * Seldane 2)  Ceftin (Cefuroxime Axetil) 3)  * Hycotuss Expectorant (Hydrocodone-Guaifenesin)  Review of Systems General:  in general she feels much better today Again she states that she has swelling in her ankles, worse when she stands up for prolonged periods of time, worse at the end of the day. CV:  all over pain but no specific CP no orthopnea . Resp:  some cough SOB at baseline . GI:  Denies bloody stools.  Physical Exam  General:  alert and well-developed.   Neck:  no JVD at 45 Lungs:  normal respiratory effort, no intercostal retractions, no accessory muscle use, and normal breath sounds.   Heart:  normal rate, regular rhythm, and no murmur.   Extremities:  no pretibial edema Ankles: No edema whatsoever, if anything slightly puffy a day external malleolar area symmetrically Psych:  not anxious appearing and not depressed appearing.     Impression & Recommendations:  Problem # 1:  ? of EDEMA (ICD-782.3) again complain of edema without objective findings of such Apparently the edema is worse at the end of the day today she denies paresthesias in the lower extremities  ( some patients describe paresthesias as  " swelling") Plan: Compression stockings Her updated medication list for this problem includes:    Hydrochlorothiazide 25 Mg Tabs (Hydrochlorothiazide) .[name]... Take half  tablet by mouth once a day  Problem # 2:  HYPERLIPIDEMIA (ICD-272.4) recently found to have increased total CKs in the 400 level We are holding Zocor plan to recheck total CKs, sedimentation rate in 3 weeks Generalized aches may or may not be related to Zocor, she has fibromyalgia Her updated medication list for this problem includes:    Zocor 40 Mg Tabs (Simvastatin) .[name]... 1 1/2 by mouth once daily  Labs Reviewed: SGOT: 25 (06/15/2009)   SGPT: 22 (06/15/2009)   HDL:64.20 (10/29/2008), 62.0 (09/25/2007)  LDL:58 (10/29/2008), 104 (16/10/9602)  Chol:133 (10/29/2008), 182 (09/25/2007)  Trig:54.0 (10/29/2008), 78 (09/25/2007)  Problem # 3:  FIBROMYALGIA (ICD-729.1) see #2 Her updated medication list for this problem includes:    Etodolac Cr 400 Mg Tb24 (Etodolac) ..... One by mouth daily    Aspirin 81 Mg Tbec (Aspirin) .[name]... Take one tablet by mouth daily  Problem # 4:  UTI (ICD-599.0) workup at the ER   included  a urinalysis consistent with UTI,  on Bactrim, urine culture pending  Problem # 5:  OBSTRUCTIVE SLEEP APNEA (ICD-327.23) again recommend to see pulmonary  Complete Medication List: 1)  Qvar 80 Mcg/act Aers (Beclomethasone dipropionate) .[name].. 1 puff two times a day 2)  Spiriva Handihaler 18 Mcg Caps (Tiotropium bromide monohydrate) .[name].. 1 puff  once daily 3)  Ventolin Hfa 108 (90 Base) Mcg/act Aers (Albuterol sulfate) .... 2 puffs q 6 hours as needed wheezing and coughing 4)  Prilosec Otc 20 Mg Tbec (Omeprazole magnesium) .... Take 1 tablet by mouth once a day 5)  Amlodipine Besylate 10 Mg Tabs (Amlodipine besylate) .[name].. 1 by mouth qd 6)  Hydrochlorothiazide 25 Mg Tabs (Hydrochlorothiazide) .... Take half  tablet by mouth once a day 7)  Metoprolol Succinate 50 Mg Tb24 (Metoprolol succinate) .... 1/2 every day.    take extra half as needed for palpitations 8)  Zocor 40 Mg Tabs (Simvastatin) .[name].. 1 1/2 by mouth once daily 9)  Metformin Hcl 500 Mg Xr24h-tab (Metformin hcl) .... One by mouth daily 10)  Lyrica 100 Mg Caps (Pregabalin) .[name].. 1 by mouth at bedtime prn 11)  Etodolac Cr 400 Mg Tb24 (Etodolac) .... One by mouth daily 12)  Potassium 99 Mg Tabs (Potassium) .[name].. 1 tab once daily 13)  Glucosamine Chondr 1500 Complx Caps (Glucosamine-chondroit-vit c-mn) .[name].. 1 tab once daily 14)  Aspirin 81 Mg Tbec (Aspirin) .... Take one tablet by mouth daily 15)  Multivitamins Tabs (Multiple vitamin) .[name].. 1 tab once daily  Patient Instructions: 1)  use a compression stocking every day 2)  Low-salt diet and elevate your legs the images twice a day 3)  Come back in 3 weeks for labs only: Total CK, sedimentation rate DX hyperlipidemia 4)  Office visit in 3 months  Appended Document: follow-up with Dr. Salomon Mast addendum: Urine culture showed  KLEBSIELLA OXYTOCA sensitive to Bactrim, she was prescribed 5 days worth of Bactrim.

## 2010-02-28 NOTE — Progress Notes (Signed)
Summary: Patient not better  Phone Note Call from Patient Call back at Home Phone [phone]   Caller: Patient Call For: [place] E. Paz MD Summary of Call: Patient was diagnosed a few weeks ago with UTI.  She is still having problems, wiping blood from her urethra.  She finished the medication but still experiencing back pain and its getting worse.  She is requesting a CT scan or to be referred to a urologist. Initial call taken by: Barnie Mort,  September 09, 2009 8:37 AM  Follow-up for Phone Call        scheduled a  office visit for next week. Will reassess the pain and reculture the urine. Follow-up by: Nolon Rod. Paz MD,  September 09, 2009 11:30 AM  Additional Follow-up for Phone Call Additional follow up Details #1::        pt has an appt tomorrow 09/13/09 @ 11:40 Additional Follow-up by: Lavell Islam,  September 12, 2009 11:11 AM

## 2010-02-28 NOTE — Assessment & Plan Note (Signed)
Summary: 3 MONTH FOLLOWUP///SPH   Vital Signs:  Patient profile:   70 year old female Height:      63.25 inches Weight:      188.2 pounds O2 Sat:      98 % Pulse rate:   83 / minute BP sitting:   104 / 60  Vitals Entered By: Shary Decamp (March 18, 2009 10:40 AM) CC: ROV - FASTING   History of Present Illness: palpitations, status post cardiology evaluation, her echo was okay, symptoms  believed to be secondary to COPD. Sx better   Diabetes-- ambulatory CBGs 80 to 100  COPD-- good medication compliance , uses albuterol aprox 1/week  Hypertension-- ambulatory BPs in the low side , occasionally sleepy but feels GOOD         Current Medications (verified): 1)  Prilosec Otc 20 Mg Tbec (Omeprazole Magnesium) .... Take 1 Tablet By Mouth Once A Day 2)  Ventolin Hfa 108 (90 Base) Mcg/act Aers (Albuterol Sulfate) .... 2 Puffs Q 6 Hours As Needed Wheezing and Coughing 3)  Amlodipine Besylate 10 Mg Tabs (Amlodipine Besylate) .[name].. 1 By Mouth Qd 4)  Hydrochlorothiazide 25 Mg Tabs (Hydrochlorothiazide) .... Take Half  Tablet By Mouth Once A Day 5)  Metoprolol Succinate 50 Mg Tb24 (Metoprolol Succinate) .... 1/2 Every Day May Take Extra Half As Needed For Palpitations 6)  Zocor 40 Mg Tabs (Simvastatin) .[name].. 1 1/2 By Mouth Once Daily 7)  Glipizide 5 Mg Tb24 (Glipizide) .[name].. 1 By Mouth Qd 8)  Lyrica 100 Mg  Caps (Pregabalin) .[name].. 1 By Mouth At Bedtime Prn 9)  Etodolac Cr 400 Mg  Tb24 (Etodolac) .... One By Mouth Daily 10)  Potassium 99 Mg Tabs (Potassium) .[name].. 1 Tab Once Daily 11)  Glucosamine Chondr 1500 Complx   Caps (Glucosamine-Chondroit-Vit C-Mn) .[name].. 1 Tab Once Daily 12)  Aspirin 81 Mg Tbec (Aspirin) .... Take One Tablet By Mouth Daily 13)  Spiriva Handihaler 18 Mcg  Caps (Tiotropium Bromide Monohydrate) .[name].. 1 Puff Once Daily 14)  Qvar 80 Mcg/act Aers (Beclomethasone Dipropionate) .[name].. 1 Puff Two Times A Day 15)  Multivitamins   Tabs (Multiple Vitamin) .[name].. 1 Tab Once  Daily  Allergies (verified): 1)  * Seldane 2)  Ceftin (Cefuroxime Axetil) 3)  * Hycotuss Expectorant (Hydrocodone-Guaifenesin)  Comments:  Nurse/Medical Assistant: The patient's medications were reviewed with the patient and were updated in the Medication List. Shary Decamp (March 18, 2009 10:41 AM)  Past History:  Past Medical History: Diabetes mellitus, type II COPD (01/29/2002) Hyperlipidemia Hypertension thyroid tumor atypical CP-  Nov 2008: saw cards: neg/normal Holter, ECHO and Myoview Admited w/ CP 4-09: neg enz. and CT chest. Had a 9 beat NSVT Fibromyalgia  abnormal CT of the chest in 2008, follow-up CT , April 2009: Unchanged.  No follow-up suggested  Past Surgical History: Reviewed history from 12/06/2008 and no changes required. Cervical laminectomy Hysterectomy Oophorectomy foot surgery neg cardiolite 10/05 barium swallow neg 2/04 EGD neg 9/06  Social History: Reviewed history from 10/20/2008 and no changes required. Married on disability since 2000 pt has children x 2  Patient states former smoker , quit in the late 90s ETOH--no  Review of Systems       has improved  her diet, exercising more CV:  Denies chest pain or discomfort; occasionally swelling ankles . Resp:  Denies coughing up blood. GI:  Denies diarrhea, nausea, and vomiting. Endo:    low sugar symptoms x1 time (CBG 70).  Physical Exam  General:  alert,  well-developed, and well-nourished.   Lungs:  normal respiratory effort, no intercostal retractions, no accessory muscle use, and normal breath sounds.   Heart:  normal rate, regular rhythm, and no murmur.   Extremities:  no lower extremity edema   Impression & Recommendations:  Problem # 1:  HYPERTENSION (ICD-401.9) BP is slightly low, see instructions Her updated medication list for this problem includes:    Amlodipine Besylate 10 Mg Tabs (Amlodipine besylate) .[name]... 1 by mouth qd    Hydrochlorothiazide 25 Mg Tabs  (Hydrochlorothiazide) .[name]... Take half  tablet by mouth once a day    Metoprolol Succinate 50 Mg Tb24 (Metoprolol succinate) .[name]... 1/2 every day may take extra half as needed for palpitations  BP today: 104/60 Prior BP: 119/80 (12/22/2008)  Labs Reviewed: K+: 3.8 (11/23/2008) Creat: : 0.9 (11/23/2008)   Chol: 133 (10/29/2008)   HDL: 64.20 (10/29/2008)   LDL: 58 (10/29/2008)   TG: 54.0 (10/29/2008)  Problem # 2:  HYPERLIPIDEMIA (ICD-272.4) well-controlled Her updated medication list for this problem includes:    Zocor 40 Mg Tabs (Simvastatin) .[name]... 1 1/2 by mouth once daily  Labs Reviewed: SGOT: 25 (10/29/2008)   SGPT: 20 (10/29/2008)   HDL:64.20 (10/29/2008), 62.0 (09/25/2007)  LDL:58 (10/29/2008), 104 (52/84/1324)  Chol:133 (10/29/2008), 182 (09/25/2007)  Trig:54.0 (10/29/2008), 78 (09/25/2007)  Problem # 3:  PALPITATIONS (ICD-785.1) improved Her updated medication list for this problem includes:    Metoprolol Succinate 50 Mg Tb24 (Metoprolol succinate) .[name]... 1/2 every day may take extra half as needed for palpitations  Problem # 4:  DIABETES MELLITUS, TYPE II (ICD-250.00) well control, rarely has low sugars, see instructions Her updated medication list for this problem includes:    Glipizide 5 Mg Tb24 (Glipizide) .[name]... 1 by mouth qd    Aspirin 81 Mg Tbec (Aspirin) .[name]... Take one tablet by mouth daily  Labs Reviewed: Creat: 0.9 (11/23/2008)    Reviewed HgBA1c results: 6.3 (10/29/2008)  6.5 (09/16/2007)  Problem # 5:  over all doing great  Complete Medication List: 1)  Qvar 80 Mcg/act Aers (Beclomethasone dipropionate) .[name].. 1 puff two times a day 2)  Spiriva Handihaler 18 Mcg Caps (Tiotropium bromide monohydrate) .[name].. 1 puff once daily 3)  Ventolin Hfa 108 (90 Base) Mcg/act Aers (Albuterol sulfate) .... 2 puffs q 6 hours as needed wheezing and coughing 4)  Prilosec Otc 20 Mg Tbec (Omeprazole magnesium) .... Take 1 tablet by mouth once a day 5)  Amlodipine Besylate 10 Mg Tabs  (Amlodipine besylate) .[name].. 1 by mouth qd 6)  Hydrochlorothiazide 25 Mg Tabs (Hydrochlorothiazide) .... Take half  tablet by mouth once a day 7)  Metoprolol Succinate 50 Mg Tb24 (Metoprolol succinate) .... 1/2 every day may take extra half as needed for palpitations 8)  Zocor 40 Mg Tabs (Simvastatin) .[name].. 1 1/2 by mouth once daily 9)  Glipizide 5 Mg Tb24 (Glipizide) .[name].. 1 by mouth qd 10)  Lyrica 100 Mg Caps (Pregabalin) .[name].. 1 by mouth at bedtime prn 11)  Etodolac Cr 400 Mg Tb24 (Etodolac) .... One by mouth daily 12)  Potassium 99 Mg Tabs (Potassium) .[name].. 1 tab once daily 13)  Glucosamine Chondr 1500 Complx Caps (Glucosamine-chondroit-vit c-mn) .[name].. 1 tab once daily 14)  Aspirin 81 Mg Tbec (Aspirin) .... Take one tablet by mouth daily 15)  Multivitamins Tabs (Multiple vitamin) .[name].. 1 tab once daily  Patient Instructions: 1)  Please schedule a follow-up appointment in 3 months .  2)  let me know if your BP is consistently less than 100/60  or if you feel weak 3)  let me know if your sugar is frequently (more than twice a week) less than 70 Prescriptions: LYRICA 100 MG  CAPS (PREGABALIN) 1 by mouth at bedtime PRN  #30 x 1   Entered by:   Shary Decamp   Authorized by:   Nolon Rod. Paz MD   Signed by:   Shary Decamp on 03/18/2009   Method used:   Printed then faxed to ...       [facility] [address] [phone]* (retail)       [address]       [place], [place]  [address]       Ph: [phone] or [phone]       Fax: [phone]   RxID:   [id number]

## 2010-02-28 NOTE — Progress Notes (Signed)
Summary: cpap pressure  Phone Note Call from Patient Call back at [facility] Phone [phone]   Caller: Patient Call For: clance Summary of Call: pt not getting sleep. feels like cpap pressure is too high. says [facility] is coming out to her home tomorrow.  Initial call taken by: Tivis Ringer, CNA,  October 24, 2009 4:02 PM  Follow-up for Phone Call        Called and spoke with pt.  She states that she has had trouble adjusting with cpap, thinks that it is due to her mask.  She states that air is leaking and makes a loud noise that wakes her up all night.  Currently using full face mask.  She states that [facility] is coming out to her home tommorrow.  Pls advise thanks Follow-up by: Vernie Murders,  October 24, 2009 4:15 PM  Additional Follow-up for Phone Call Additional follow up Details #1::        sounds like a mask issue, not a pressure issue.  Her pressure is supposed to be put on 9cm..they are probably coming to decrease to this level.  would like her to discuss mask issues with them, and get them to look at fitting. Additional Follow-up by: Barbaraann Share MD,  October 24, 2009 4:50 PM    Additional Follow-up for Phone Call Additional follow up Details #2::    Spoke with pt and notified of recs per [facility].  Pt verbalized understanding. Follow-up by: Vernie Murders,  October 24, 2009 4:59 PM

## 2010-02-28 NOTE — Progress Notes (Signed)
Summary: check on pt (WED)  Phone Note Call from Patient Call back at [facility] Phone [phone] Call back at cell [phone]   Caller: Patient Summary of Call: still have swelling, feels weak, and having pain in head and eye pain.off and on Friday am seeing what looked like cracked glass. RECOMMEND PT TO ED for full assessment, pt agreed will go to [facility] ED .Kandice Hams  Jun 20, 2009 12:13 PM  Initial call taken by: Kandice Hams,  Jun 20, 2009 12:13 PM  Follow-up for Phone Call        check on her in a couple of days please Jose E. Paz MD  Jun 20, 2009 1:50 PM   pt here for ov today.......Marland KitchenShary Decamp  Jun 21, 2009 1:30 PM

## 2010-02-28 NOTE — Assessment & Plan Note (Signed)
Summary: Pulmonary/ final f/u ov doing 100% better!   Copy to:  Samantha [name] Primary Provider/Referring Provider:  Drue [name]  CC:  4 wk followup.  Pt c/o "heart pounding loud"- x 4 wks.  Also has noticed increase in her BP readings..  History of Present Illness: 70 yobf quit smoking in 1999 with dx of pseudoasthma / vcd in 2005 and nl sprirometry with rec for f/u ent as needed.  July 06, 2009 Followup dyspnea.  Pt last seen here in 2006.  She states that her breathing is the same as when last seen. Admitted at least once and to ER last time 2 weeks ago > "no better"  on qvar and spiriva,  maybe after albuterol but not much.   Today is one of her better days walked in 50 ft with more fatigue than sob and pft's again are perfectly nl.   Occasions where loose breath sitting still, assoc with hoarseness, dry cough.   July 20, 2009--Returns for follow up and med review. Last visit several meds were stopped, -metoprolol, spiriva, qvar, ancd norvasc. Bystolic was added. Breathing is better-much better. Dry cough is gone. Dyspnea is gone. Walking is much better, her activity tolerance is much better. Does report she was found to have  myalgias, w/ elevated CK level , zocor stopped recently.   August 24, 2009 4 wk followup.  Pt c/o "heart pounding loud"- x 4 wks.  Also has noticed increase in her BP readings.  all of her other c/os have resolved. no palp with ex with nl ex tol   Pt denies any significant sore throat, dysphagia, itching, sneezing,  nasal congestion or excess secretions,  fever, chills, sweats, unintended wt loss, pleuritic or exertional cp, hempoptysis, change in activity tolerance  orthopnea pnd or leg swelling   Current Medications (verified): 1)  Aspirin 81 Mg Tbec (Aspirin) .... Take One Tablet By Mouth Daily 2)  Bystolic 10 Mg  Tabs (Nebivolol Hcl) .... One Tablet Daily 3)  Hydrochlorothiazide 25 Mg Tabs (Hydrochlorothiazide) .... Take Half  Tablet By Mouth Once A Day 4)  Metformin Hcl 500 Mg  Xr24h-Tab (Metformin Hcl) .... One By Mouth Daily 5)  Lyrica 100 Mg  Caps (Pregabalin) .[name].. 1 By Mouth At Bedtime 6)  Etodolac Cr 400 Mg  Tb24 (Etodolac) .... One By Mouth Daily 7)  Potassium 99 Mg Tabs (Potassium) .[name].. 1 Tab Once Daily 8)  Multivitamins   Tabs (Multiple Vitamin) .[name].. 1 Tab Once Daily 9)  Glucosamine Chondr 1500 Complx   Caps (Glucosamine-Chondroit-Vit C-Mn) .[name].. 1 Tab Once Daily 10)  Prilosec Otc 20 Mg Tbec (Omeprazole Magnesium) .... Take One 30-60 Min Before First Meal of The Day 11)  Pepcid Ac Maximum Strength 20 Mg Tabs (Famotidine) .... One At Bedtime 12)  Cpap .... Wear At Bedtime 13)  Ventolin Hfa 108 (90 Base) Mcg/act Aers (Albuterol Sulfate) .... 2 Puffs Every 4 Hours As Needed Wheezing and Coughing 14)  Tylenol Arthritis Pain 650 Mg Cr-Tabs (Acetaminophen) .... Take As Directed 15)  Amoxicillin 500 Mg Caps (Amoxicillin) .... 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 and CT chest. Had a 9 beat NSVT Fibromyalgia  abnormal CT of the chest in 2008, follow-up CT , April 2009: Unchanged.  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  HEENT: nl dentition, turbinates, and orophanx. Nl external ear canals without cough reflex NECK :  without JVD/Nodes/TM/ nl carotid upstrokes bilaterally LUNGS: no acc muscle use, clear to A and P bilaterally without cough on insp or exp maneuvers CV:  RRR  no s3 or murmur or increase in P2, no edema  ABD:  soft and nontender with nl excursion in the supine position. No bruits or organomegaly, bowel sounds nl MS:  warm without deformities, calf tenderness, cyanosis or clubbing       Impression & Recommendations:  Problem # 1:  DYSPNEA ON EXERTION (ICD-786.09)  Resolved  Orders: Est. Patient Level IV (16109)  Problem # 2:  HYPERTENSION (ICD-401.9)  Her updated medication list for this problem includes:    Bystolic 10 Mg Tabs (Nebivolol hcl) ..... One tablet daily    Hydrochlorothiazide 25 Mg Tabs (Hydrochlorothiazide) .[name]... Take half  tablet by mouth once a day  Bystolic, the most beta -1  selective Beta blocker available in sample form, with bisoprolol the most selective generic choice  on the market but would avoid non- specific betablockers for now  Orders: Est. Patient Level IV (60454)  Problem # 3:  PALPITATIONS (ICD-785.1)  Her updated medication list for this problem includes:    Bystolic 10 Mg Tabs (Nebivolol hcl) ..... One tablet daily  Chronic, recurrent complaint, already eval by cards   Orders: Est. Patient Level IV (09811)  Patient Instructions: 1)  See calendar for specific medication instructions and bring it back for each and every office visit for every healthcare provider you see.  Without it,  you may not receive the best quality medical care that we feel you deserve.  2)  If not better to your satisfaction return to pulmonary clinic

## 2010-02-28 NOTE — Assessment & Plan Note (Signed)
Summary: rov for osa   Copy to:  Willow Ora Primary Provider/Referring Provider:  Drue Novel  CC:  Pt is here for a f/u appt.  Pt was last seen Nov 2009.  Pt would like to discuss starting cpap.  Pt c/o feeling "tired" throughout the day.  [name]  History of Present Illness: The pt comes in today for f/u of her osa.  She was diagnosed with mild to moderate osa in 2009, and decided to work on weight loss as her primary mode of treatment.  She has lost 15 pounds since the last visit, but has continued to have sleep disruption and significant daytime sleepiness and fatigue.  She is also concerned about its impact on her other health issues.    Current Medications (verified): 1)  Qvar 80 Mcg/act Aers (Beclomethasone Dipropionate) .[name].. 1 Puff Two Times A Day 2)  Spiriva Handihaler 18 Mcg  Caps (Tiotropium Bromide Monohydrate) .[name].. 1 Puff Once Daily 3)  Ventolin Hfa 108 (90 Base) Mcg/act Aers (Albuterol Sulfate) .... 2 Puffs Q 6 Hours As Needed Wheezing and Coughing 4)  Prilosec Otc 20 Mg Tbec (Omeprazole Magnesium) .... Take 1 Tablet By Mouth Once A Day 5)  Amlodipine Besylate 10 Mg Tabs (Amlodipine Besylate) .[name].. 1 By Mouth Qd 6)  Hydrochlorothiazide 25 Mg Tabs (Hydrochlorothiazide) .... Take Half  Tablet By Mouth Once A Day 7)  Metoprolol Succinate 50 Mg Tb24 (Metoprolol Succinate) .... 1/2 Every Day.   Take Extra Half As Needed For Palpitations 8)  Zocor 40 Mg Tabs (Simvastatin) .[name].. 1 1/2 By Mouth Once Daily 9)  Metformin Hcl 500 Mg Xr24h-Tab (Metformin Hcl) .... One By Mouth Daily 10)  Lyrica 100 Mg  Caps (Pregabalin) .[name].. 1 By Mouth At Bedtime Prn 11)  Etodolac Cr 400 Mg  Tb24 (Etodolac) .... One By Mouth Daily 12)  Potassium 99 Mg Tabs (Potassium) .[name].. 1 Tab Once Daily 13)  Glucosamine Chondr 1500 Complx   Caps (Glucosamine-Chondroit-Vit C-Mn) .[name].. 1 Tab Once Daily 14)  Aspirin 81 Mg Tbec (Aspirin) .... Take One Tablet By Mouth Daily 15)  Multivitamins   Tabs (Multiple Vitamin) .[name].. 1 Tab Once  Daily  Allergies (verified): 1)  * Seldane 2)  Ceftin (Cefuroxime Axetil) 3)  * Hycotuss Expectorant (Hydrocodone-Guaifenesin)  Review of Systems      See HPI  Vital Signs:  Patient profile:   70 year old female Height:      63 inches Weight:      188 pounds O2 Sat:      97 % on Room air Temp:     98.7 degrees F oral Pulse rate:   81 / minute BP sitting:   118 / 76  (right arm) Cuff size:   regular  Vitals Entered By: Arman Filter LPN (Jun 04, 6211 1:57 PM)  O2 Flow:  Room air CC: Pt is here for a f/u appt.  Pt was last seen Nov 2009.  Pt would like to discuss starting cpap.  Pt c/o feeling "tired" throughout the day.   Comments Medications reviewed with patient Arman Filter LPN  Jun 04, 863 2:00 PM    Physical Exam  General:  ow female in nad Neurologic:  appears sleepy, but moves all 4.   Impression & Recommendations:  Problem # 1:  OBSTRUCTIVE SLEEP APNEA (ICD-327.23) The pt has a h/o mild to moderate osa by sleep study in 2009, and has lost weight since her last visit.  However, it has not been enough.  She is having restless sleep, nonrestorative sleep, and daytime sleepiness.  She is willing to try cpap at this time, and I have encouraged her to work more aggressively on weight loss.  Will start out at a moderate pressure to allow desensitization, and then will work on pressure optimization.  Time spent with pt was  Medications Added to Medication List This Visit: 1)  Metoprolol Succinate 50 Mg Tb24 (Metoprolol succinate) .... 1/2 every day.   take extra half as needed for palpitations  Other Orders: Est. Patient Level III (11914) DME Referral (DME)  Patient Instructions: 1)  will start on cpap for your sleep apnea.  please call if having issues with tolerance 2)  work on weight loss 3)  followup with me in 5 weeks.

## 2010-02-28 NOTE — Assessment & Plan Note (Signed)
Summary: Pulmonary/ unexplained doe with nl spirometry and walking sats   Copy to:  Willow Ora Primary Provider/Referring Provider:  Drue Novel  CC:  Followup dyspnea.  Pt last seen here in 2006.  She states that her breathing is the same as when last seen.  She states that she never noticed any improvement..  History of Present Illness: 70 yobf quit smoking in 1999 with dx of pseudoasthma / vcd in 2005 and nl sprirometry with rec for f/u ent as needed.  July 06, 2009 Followup dyspnea.  Pt last seen here in 2006.  She states that her breathing is the same as when last seen. Admitted at least once and to ER last time 2 weeks ago > "no better"  on qvar and spiriva,  maybe after albuterol but not much.   Today is one of her better days walked in 50 ft with more fatigue than sob and pft's again are perfectly nl.   Occasions where loose breath sitting still, assoc with hoarseness, dry cough.  Pt denies any significant sore throat, dysphagia, itching, sneezing,  nasal congestion or excess secretions,  fever, chills, sweats, unintended wt loss, pleuritic or exertional cp, hempoptysis, change in activity tolerance  orthopnea pnd or leg swelling Pt also denies any obvious fluctuation in symptoms with weather or environmental change or other alleviating or aggravating factors.         Current Medications (verified): 1)  Qvar 80 Mcg/act Aers (Beclomethasone Dipropionate) .[name].. 1 Puff Two Times A Day 2)  Spiriva Handihaler 18 Mcg  Caps (Tiotropium Bromide Monohydrate) .[name].. 1 Puff Once Daily 3)  Ventolin Hfa 108 (90 Base) Mcg/act Aers (Albuterol Sulfate) .... 2 Puffs Q 6 Hours As Needed Wheezing and Coughing 4)  Prilosec Otc 20 Mg Tbec (Omeprazole Magnesium) .... Take 1 Tablet By Mouth Once A Day 5)  Amlodipine Besylate 10 Mg Tabs (Amlodipine Besylate) .[name].. 1 By Mouth Qd 6)  Hydrochlorothiazide 25 Mg Tabs (Hydrochlorothiazide) .... Take Half  Tablet By Mouth Once A Day 7)  Metoprolol Succinate 50 Mg Tb24  (Metoprolol Succinate) .... 1/2 Every Day.   Take Extra Half As Needed For Palpitations 8)  Zocor 40 Mg Tabs (Simvastatin) .[name].. 1 1/2 By Mouth Once Daily 9)  Metformin Hcl 500 Mg Xr24h-Tab (Metformin Hcl) .... One By Mouth Daily 10)  Lyrica 100 Mg  Caps (Pregabalin) .[name].. 1 By Mouth At Bedtime Prn 11)  Etodolac Cr 400 Mg  Tb24 (Etodolac) .... One By Mouth Daily 12)  Potassium 99 Mg Tabs (Potassium) .[name].. 1 Tab Once Daily 13)  Glucosamine Chondr 1500 Complx   Caps (Glucosamine-Chondroit-Vit C-Mn) .[name].. 1 Tab Once Daily 14)  Aspirin 81 Mg Tbec (Aspirin) .... Take One Tablet By Mouth Daily 15)  Multivitamins   Tabs (Multiple Vitamin) .[name].. 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 and CT chest. Had a 9 beat NSVT Fibromyalgia  abnormal CT of the chest in 2008, follow-up CT , April 2009: Unchanged.  No follow-up suggested h/o thyroid tumor--partial thyroidectomy in the 67s  Family History: Reviewed history from  12/22/2008 and no changes required. allergies: sister asthma: sister, paternal grandmother heart disease: father, mother, paternal grandparents, maternal grandparents, brother cancer: mother (lung)  breast ca--no colon ca--no  Social History: Married on disability since 2000 pt has children x 2  Patient states former smoker , quit in the late 87s.  ETOH--no  Review of Systems       The patient complains of shortness of breath with activity, shortness of breath at rest, non-productive cough, chest pain, irregular heartbeats, loss of appetite, weight change, difficulty  swallowing, itching, hand/feet swelling, and joint stiffness or pain.  The patient denies productive cough, coughing up blood, acid heartburn, indigestion, abdominal pain, sore throat, tooth/dental problems, headaches, nasal congestion/difficulty breathing through nose, sneezing, ear ache, anxiety, depression, rash, change in color of mucus, and fever.    Vital Signs:  Patient profile:   70 year old female Weight:      188 pounds BMI:     33.42 O2 Sat:      99 % on Room air Temp:     97.9 degrees F oral Pulse rate:   64 / minute BP sitting:   130 / 74  (left arm)  Vitals Entered By: Vernie Murders (July 06, 2009 2:00 PM)  O2 Flow:  Room air CC: Followup dyspnea.  Pt last seen here in 2006.  She states that her breathing is the same as when last seen.  She states that she never noticed any improvement. Comments Ambulatory Pulse Oximetry  Resting; HR__74___    02 Sat___99__  Lap1 (185 feet)   HR__93___   02 Sat__97___ Lap2 (185 feet)   HR___89__   02 Sat__96___    Lap3 (185 feet)   HR_____   02 Sat_____  ___Test Completed without Difficulty _X__Test Stopped due to: leg fatigue  Boone Master CNA/MA  July 06, 2009 2:56 PM    Physical Exam  Additional Exam:  wt 186 > 188 July 06, 2009  amb hoarse bf nad with classic pseudowheeze resolves with purse lip maneuver  HEENT: nl dentition, turbinates, and orophanx. Nl external ear canals without cough reflex NECK :  without JVD/Nodes/TM/ nl carotid upstrokes bilaterally LUNGS: no acc muscle use, clear to A and P bilaterally without cough on insp or exp maneuvers CV:  RRR  no s3 or murmur or increase in P2, no edema  ABD:  soft and nontender with nl excursion in the supine position. No bruits or organomegaly, bowel sounds nl MS:  warm without deformities, calf tenderness, cyanosis or clubbing SKIN: warm and dry without lesions   NEURO:  alert, approp, no deficits     Sodium                    141 mEq/L                   135-145    Potassium                 4.0 mEq/L                   3.5-5.1   Chloride                  104 mEq/L                   96-112   Carbon Dioxide            30 mEq/L  19-32   Glucose              [H]  102 mg/dL                   37-16   BUN                       21 mg/dL                    9-67   Creatinine                0.8 mg/dL                   8.9-3.8   Calcium                   9.4 mg/dL                   1.0-17.5   GFR                       93.31 mL/min                >60  Tests: (2) CBC Platelet w/Diff (CBCD)   White Cell Count     [L]  4.4 K/uL                    4.5-10.5   Red Cell Count            4.48 Mil/uL                 3.87-5.11   Hemoglobin                13.1 g/dL                   10.2-58.5   Hematocrit                38.7 %                      36.0-46.0   MCV                       86.3 fl                     78.0-100.0   MCHC                      33.8 g/dL                   27.7-82.4   RDW                       14.1 %                      11.5-14.6   Platelet Count            159.0 K/uL                  150.0-400.0   Neutrophil %              51.3 %                      43.0-77.0   Lymphocyte %  39.1 %                      12.0-46.0   Monocyte %                7.2 %                       3.0-12.0   Eosinophils%              1.4 %                       0.0-5.0   Basophils %               1.0 %                       0.0-3.0   Neutrophill Absolute      2.3 K/uL                    1.4-7.7   Lymphocyte Absolute       1.7 K/uL                    0.7-4.0   Monocyte Absolute         0.3 K/uL                    0.1-1.0  Eosinophils, Absolute                             0.1 K/uL                    0.0-0.7   Basophils Absolute        0.0 K/uL                    0.0-0.1  Tests: (3) TSH (TSH)   FastTSH                   1.91 uIU/mL                 0.35-5.50  Tests: (4) B-Type Natiuretic Peptide (BNPR)  B-Type Natriuetic Peptide                              31.6 pg/mL                  0.0-100.0  CXR  Procedure date:  07/06/2009  Findings:      Comparison: 11/23/2008.   Findings: Heart size within normal limits.  Negative for heart failure.  The lungs are clear without infiltrate or effusion.  No significant pulmonary scarring.  There is thoracic levoscoliosis. There is been prior cervical fusion.   IMPRESSION: No acute radiographic abnormality.  Impression & Recommendations:  Problem # 1:  DYSPNEA ON EXERTION (ICD-786.09)  The following medications were removed from the medication list:    Qvar 80 Mcg/act Aers (Beclomethasone dipropionate) .[name]... 1 puff two times a day    Spiriva Handihaler 18 Mcg Caps (Tiotropium bromide monohydrate) .[name]... 1 puff once daily    Metoprolol Succinate 50 Mg Tb24 (Metoprolol succinate) .[name]... 1/2 every day.   take extra half as needed for palpitations Her updated medication list for this problem includes:    Hydrochlorothiazide 25 Mg Tabs (Hydrochlorothiazide) .[name]... Take half  tablet  by mouth once a day    Bystolic 10 Mg Tabs (Nebivolol hcl) ..... One tablet daily    Ventolin Hfa 108 (90 Base) Mcg/act Aers (Albuterol sulfate) .[name]... 2 puffs q 6 hours as needed wheezing and coughing   COPD excluded by nl pft's and still favor vcd/ anxiety/deconditioning  but she could still have asthma and the only way to rule it out is to do a methacholine while on max acid rx and Bystolic, the most beta -1  selective Beta blocker available in sample form, with bisoprolol the most selective generic choice  on the market.  I spent extra time with the patient today explaining optimal mdi  technique.  This improved from  25-75%.  Try off all inhalers x ventolin with above changes then needs to return for med reconciliation.    Problem # 2:  HYPERTENSION (ICD-401.9) Avoid ccb (effect on LES in pt with suspected GERD/LPR/VCD) and use Bystolic, the most beta -1  selective Beta blocker available in sample form, with  bisoprolol the most selective generic choice  on the market.   The following medications were removed from the medication list:    Amlodipine Besylate 10 Mg Tabs (Amlodipine besylate) .[name]... 1 by mouth qd    Metoprolol Succinate 50 Mg Tb24 (Metoprolol succinate) .[name]... 1/2 every day.   take extra half as needed for palpitations Her updated medication list for this problem includes:    Hydrochlorothiazide 25 Mg Tabs (Hydrochlorothiazide) .[name]... Take half  tablet by mouth once a day    Bystolic 10 Mg Tabs (Nebivolol hcl) ..... One tablet daily  Medications Added to Medication List This Visit: 1)  Prilosec Otc 20 Mg Tbec (Omeprazole magnesium) .... Take one 30-60 min before first and last meals of the day 2)  Bystolic 10 Mg Tabs (Nebivolol hcl) .... One tablet daily 3)  Pepcid Ac Maximum Strength 20 Mg Tabs (Famotidine) .... One at bedtime  Other Orders: Pulse Oximetry, Ambulatory (16109) HFA Instruction [phone]) Pulse Oximetry, Ambulatory (09811) TLB-BMP (Basic Metabolic Panel-BMET) (80048-METABOL) TLB-CBC Platelet - w/Differential (85025-CBCD) TLB-TSH (Thyroid Stimulating Hormone) (84443-TSH) TLB-BNP (B-Natriuretic Peptide) (83880-BNPR) T-2 View CXR (71020TC) New Patient Level V (91478)  Patient Instructions: 1)  Stop metaprolol and amlodipine and spiriva and qvar 2)  use ventolin if needed 3)  start bystolic 10 mg one daily 4)  Acid reflux is the leading suspect here and needs to be eliminated  completely before considering additional studies or treatment options. To suppress this maximally, take Prilosec  before first and last meal and pepcid 20 mg (otc) at bedtime plus diet measures as listed.  5)  GERD (REFLUX)  is a common cause of respiratory symptoms. It commonly presents without heartburn and can be treated with medication, but also with lifestyle changes including avoidance of late meals, excessive alcohol, smoking cessation, and avoid fatty foods, chocolate, peppermint, colas, red  wine, and acidic juices such as orange juice. NO MINT OR MENTHOL PRODUCTS SO NO COUGH DROPS  6)  USE SUGARLESS CANDY INSTEAD (jolley ranchers)  7)  NO OIL BASED VITAMINS  8)  See Tammy NP w/in 2 weeks with all your medications, even over the counter meds, separated in two separate bags, the ones you take no matter what vs the ones you stop once you feel better and take only as needed.  She will generate for you a new user friendly medication calendar that will put [place] all on the same page re: your medication use.    CXR  Procedure date:  07/06/2009  Findings:      Comparison: 11/23/2008.   Findings: Heart size within normal limits.  Negative for heart failure.  The lungs are clear without infiltrate or effusion.  No significant pulmonary scarring.  There is thoracic levoscoliosis. There is been prior cervical fusion.   IMPRESSION: No acute radiographic abnormality.

## 2010-02-28 NOTE — Miscellaneous (Signed)
Summary: Orders Update pft charges  Clinical Lists Changes  Orders: Added new Service order of Carbon Monoxide diffusing w/capacity (94720) - Signed Added new Service order of Lung Volumes (94240) - Signed Added new Service order of Spirometry (Pre & Post) (94060) - Signed 

## 2010-02-28 NOTE — Letter (Signed)
Summary: CMN for Face Mask/[facility]  CMN for Face Mask/[facility]   Imported By: Lanelle Bal 11/19/2009 11:05:08  _____________________________________________________________________  External Attachment:    Type:   Image     Comment:   External Document

## 2010-02-28 NOTE — Progress Notes (Signed)
Summary: wants to d/c CPAP-ATC NA  Phone Note Call from Patient Call back at Home Phone [phone]   Caller: Patient Call For: Alianis Trimmer Reason for Call: Talk to Nurse Summary of Call: Pt c/o CPAP still waking her up at night. Does not feel the machine is adjusting correctly. C/o spine pain and  machine leaking keeping her awake at night. Has not used CPAP in a few days in order to get some sleep. Initial call taken by: Zackery Barefoot CMA,  November 03, 2009 4:18 PM  Follow-up for Phone Call        called and spoke with pt.  pt states she is not happy with her quality of sleep and states this is d/t her cpap machine.  Pt states she is "constantly" adjusting her mask throughout the night.  Pt states she even got a smaller cpap machine but states she still has leaks and has to adjust it.  Pt states her machine "makes noises" which keep her up as well.  Pt also mentioned that she has "spinal issues" that she sees another dr for but these problems with her spine cause head and neck pain which also keep her up at night.  Pt states she is just "very annoyed" with cpap right now.  Please advise.  Thanks.  Aundra Millet Reynolds LPN  November 03, 2009 4:46 PM  FYI: Pt denied any complaints with the actual pressure- which I noticed in her chart was recently set to 9cm back in Sept.    Additional Follow-up for Phone Call Additional follow up Details #1::        sounds like she is still having mask issues.  We could try to get her better fitted at the sleep center during the day..they do fittings there. However, if she is so frustrated that she does not feel this is a viable therapy for her, can d/c.  Her sleep apnea was mild, and her oxygen level did not fall enough to have to wear oxygen.  Let me know what she decides. Additional Follow-up by: Barbaraann Share MD,  November 03, 2009 5:07 PM    Additional Follow-up for Phone Call Additional follow up Details #2::    ATC NA and no option to leave msg, WCB Vernie Murders  November 03, 2009 5:10 PM   called and spoke with pt and informed her of KC's recs.  Pt states she would like to d/c cpap machine.  Will forward message to [facility] so he is aware and can send order to [facility].  Aundra Millet Reynolds LPN  November 04, 2009 9:31 AM   Additional Follow-up for Phone Call Additional follow up Details #3:: Details for Additional Follow-up Action Taken: noted.  will send order to d/c Additional Follow-up by: Barbaraann Share MD,  November 04, 2009 2:20 PM

## 2010-02-28 NOTE — Progress Notes (Signed)
Summary: lab results  Phone Note Outgoing Call   Call placed by: [facility] CMA,  July 14, 2009 9:11 AM Summary of Call: pt states that she is still having muscle ache on and off but it has gotten a little better. Pt states that she went to get the compression hose as you suggested but [facility] states that she needs a rx for them. pls advise................Marland KitchenFelecia Deloach CMA  July 14, 2009 9:10 AM  -pt aware of labs, letter mailed .............Marland KitchenFelecia Deloach CMA  July 14, 2009 9:14 AM   Follow-up for Phone Call        okay to prescribe compression stockings, dx edema Followup as planned [facility] Paz MD  July 15, 2009 3:21 PM  addendum: Prescription for knee-high stockings, 20-30 mm of Hg  done [place] Paz MD  July 19, 2009 12:40 PM   Additional Follow-up for Phone Call Additional follow up Details #1::        pt aware, rx mailed to pt...................Marland KitchenFelecia Deloach CMA  July 19, 2009 1:44 PM

## 2010-02-28 NOTE — Progress Notes (Signed)
Summary: REFILL  Phone Note Refill Request Message from:  Fax from Pharmacy on December 02, 2009 9:49 AM  Refills Requested: Medication #1:  ETODOLAC CR 400 MG  TB24 one by mouth daily Ermalene Postin [id number]  Initial call taken by: Okey Regal Spring,  December 02, 2009 9:58 AM  Follow-up for Phone Call        ok #30 and 6 Rf Jose E. Paz MD  December 02, 2009 3:44 PM     Prescriptions: ETODOLAC CR 400 MG  TB24 (ETODOLAC) one by mouth daily  #30 Tablet x 6   Entered by:   Army Fossa CMA   Authorized by:   Nolon Rod. Paz MD   Signed by:   Army Fossa CMA on 12/02/2009   Method used:   Electronically to        [facility] [address] [phone]* (retail)       [address]       [place], [place]  [address]       Ph: [phone] or [phone]       Fax: [phone]   RxID:   [id number]

## 2010-02-28 NOTE — Progress Notes (Signed)
Summary: Phone-blood sugar  Phone Note Call from Patient Call back at [facility] Phone [phone]   Caller: Patient Summary of Call: . Patient states her bloodsugar was 70 after breakfast. Patienrts states she flt bad and shakey. So she checked it again 30 mins later and it was 96.  Patient states she just checked it now and it is 101. Patient states she still weak.  Initial call taken by: Barb Merino,  April 15, 2009 3:38 PM  Follow-up for Phone Call        left message to call office..........Marland KitchenFelecia Deloach CMA  April 15, 2009 4:05 PM   Additional Follow-up for Phone Call Additional follow up Details #1::        Those are actually good BS.  Cut glipizide in half --- f/u Dr Drue Novel next week.   UC or ER over weekend if needed.   Additional Follow-up by: Loreen Freud DO,  April 15, 2009 4:43 PM    Additional Follow-up for Phone Call Additional follow up Details #2::    Discussed all with pt, she verballized understanding. Army Fossa CMA  April 15, 2009 4:44 PM   New/Updated Medications: GLIPIZIDE 5 MG TB24 (GLIPIZIDE) 1/2 by mouth once daily

## 2010-02-28 NOTE — Consult Note (Signed)
Summary: macrobid, estrace cream, cysto---- [facility]   Imported By: Lanelle Bal 10/26/2009 08:54:10  _____________________________________________________________________  External Attachment:    Type:   Image     Comment:   External Document

## 2010-02-28 NOTE — Assessment & Plan Note (Signed)
Summary: rov for osa   Copy to:  Samantha [name] Primary Provider/Referring Provider:  Drue Novel  CC:  Pt is here for a 5 week f/u appt.  Pt states she is wearing her cpap machine every night.  Approx 5 to  8 hours per night.  Pt states current mask causes leaks occ.  Pt denied any complaints with pressure.  [name]  History of Present Illness: the pt comes in today for f/u of her osa.  She was started on cpap at a moderate level last visit, and feels that she has done well with the device.  She has been compliant with treatment, and denies any issues with mask fit or pressure.  She feels that she is sleeping better, is more rested, and has seen improvement in her daytime alertness.  Current Medications (verified): 1)  Prilosec Otc 20 Mg Tbec (Omeprazole Magnesium) .... Take One 30-60 Min Before First and Last Meals of The Day 2)  Hydrochlorothiazide 25 Mg Tabs (Hydrochlorothiazide) .... Take Half  Tablet By Mouth Once A Day 3)  Metformin Hcl 500 Mg Xr24h-Tab (Metformin Hcl) .... One By Mouth Daily 4)  Lyrica 100 Mg  Caps (Pregabalin) .[name].. 1 By Mouth At Bedtime Prn 5)  Etodolac Cr 400 Mg  Tb24 (Etodolac) .... One By Mouth Daily 6)  Potassium 99 Mg Tabs (Potassium) .[name].. 1 Tab Once Daily 7)  Glucosamine Chondr 1500 Complx   Caps (Glucosamine-Chondroit-Vit C-Mn) .[name].. 1 Tab Once Daily 8)  Aspirin 81 Mg Tbec (Aspirin) .... Take One Tablet By Mouth Daily 9)  Multivitamins   Tabs (Multiple Vitamin) .[name].. 1 Tab Once Daily 10)  Bystolic 10 Mg  Tabs (Nebivolol Hcl) .... One Tablet Daily 11)  Pepcid Ac Maximum Strength 20 Mg Tabs (Famotidine) .... One At Bedtime 12)  Ventolin Hfa 108 (90 Base) Mcg/act Aers (Albuterol Sulfate) .... 2 Puffs Q 6 Hours As Needed Wheezing and Coughing  Allergies (verified): 1)  * Seldane 2)  Ceftin (Cefuroxime Axetil) 3)  * Hycotuss Expectorant (Hydrocodone-Guaifenesin)  Past History:  Past medical, surgical, family and social histories (including risk factors) reviewed, and no changes  noted (except as noted below).  Past Medical History: Reviewed history from 07/06/2009 and no changes required. 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 and CT chest. Had a 9 beat NSVT Fibromyalgia  abnormal CT of the chest in 2008, follow-up CT , April 2009: Unchanged.  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 partial thyroidectomy in the 60s Cervical laminectomy Hysterectomy Oophorectomy foot surgery neg cardiolite 10/05 barium swallow neg 2/04 EGD neg 9/06  Family History: Reviewed history from 12/22/2008 and no changes required. allergies: sister asthma: sister, paternal grandmother heart disease: father, mother, paternal grandparents, maternal grandparents, brother cancer: mother (lung)  breast ca--no colon ca--no  Social History: Reviewed history from 07/06/2009 and no changes required. Married on disability since 2000 pt has children x 2  Patient states former smoker , quit in the late 90s.  ETOH--no  Review of Systems       The patient complains of hand/feet swelling and joint stiffness or pain.  The patient denies shortness of breath with activity,  shortness of breath at rest, productive cough, non-productive cough, coughing up blood, chest pain, irregular heartbeats, acid heartburn, indigestion, loss of appetite, weight change, abdominal pain, difficulty swallowing, sore throat, tooth/dental problems, headaches, nasal congestion/difficulty breathing through nose, sneezing, itching, ear ache, anxiety, depression, rash, change in color of mucus, and fever.    Vital Signs:  Patient profile:   70 year old  female Height:      63 inches Weight:      186 pounds O2 Sat:      98 % on Room air Temp:     97.6 degrees F oral Pulse rate:   63 / minute BP sitting:   112 / 70  (left arm) Cuff size:   regular  Vitals Entered By: Arman Filter LPN (July 13, 2009 11:08 AM)  O2 Flow:  Room air CC: Pt is here for a 5 week f/u appt.  Pt states she is wearing her cpap machine every night.  Approx 5 to  8 hours per night.  Pt states current mask causes leaks occ.  Pt denied any complaints with pressure.   Comments Medications reviewed with patient Arman Filter LPN  July 13, 2009 11:08 AM    Physical Exam  General:  ow female in nad Nose:  no skin breakdown or pressure necrosis from the cpap mask nasal airway clear Mouth:  no exudates or other lesions. Extremities:  minimal ankle edema, no cyanosis Neurologic:  alert, does not appear sleepy, moves all 4.   Impression & Recommendations:  Problem # 1:  OBSTRUCTIVE SLEEP APNEA (ICD-327.23)  the pt is getting adjusted to cpap and feels that it has helped her sleep and daytime alertness.  She is having no mask or pressure tolerance issues.  We need to optimize the pressure for her, and she needs to work on weight loss. Care Plan:  At this point, will arrange for the patient's machine to be changed over to auto mode for 2 weeks to optimize their pressure.  I will review the downloaded data once sent by dme, and also evaluate for compliance, leaks, and residual osa.  I will call the patient and dme to discuss the results, and have the patient's machine set appropriately.  This will serve as the pt's cpap pressure titration.  Other Orders: Est. Patient Level IV (16109) DME Referral (DME)  Patient Instructions: 1)  will set your machine on auto mode for the next 2 weeks to optimize your pressure for you.  Will let you know the results. 2)  continue to work on weight loss. 3)  followup with me in 6mos, but call if having tolerance issues.

## 2010-02-28 NOTE — Progress Notes (Signed)
Summary: refill lyrica  Phone Note Refill Request   Refills Requested: Medication #1:  LYRICA 100 MG  CAPS 1 by mouth at bedtime PRN [facility]- fax [phone] - phone [phone]   Method Requested: Fax to Local Pharmacy Initial call taken by: Okey Regal Spring,  May 27, 2009 9:22 AM    Prescriptions: LYRICA 100 MG  CAPS (PREGABALIN) 1 by mouth at bedtime PRN  #30 x 3   Entered by:   Shary Decamp   Authorized by:   Nolon Rod. Paz MD   Signed by:   Shary Decamp on 05/27/2009   Method used:   Historical   RxID:   [id number]

## 2010-02-28 NOTE — Progress Notes (Signed)
Summary: Tonsils swollen/jaw neck pain  Phone Note Call from Patient Call back at Home Phone [phone]   Caller: Patient Summary of Call: Pt called and states that she woke up yesterday am and her tonsils were swollen. They have gotten better today. She is also experiencing pain in her (L) jaw and states it goes down to her ear and down her neck. She feels that it is coming from her spine because she has a "plate". She said that she knows that it needs to be x-rayed ASAP. But the pain comes and goes. She states in the VM she doesnt feel that she needs to come in but if she starts having the pain again she will go to the ER over the weekend. I tried calling pt to offer her an appt to come in. Had to leave a voicemail for pt. Army Fossa CMA  September 23, 2009 1:07 PM   Follow-up for Phone Call        needs to go to the urgent care over the weekend if pain severe, office visit here next week if symptoms persist Follow-up by: [facility] E. Asheley Hellberg MD,  September 23, 2009 3:57 PM  Additional Follow-up for Phone Call Additional follow up Details #1::        Pt is aware. she will call next week if she needs appt.  Army Fossa CMA  September 23, 2009 4:16 PM

## 2010-02-28 NOTE — Assessment & Plan Note (Signed)
Summary: 3 mth fu/kdc   Vital Signs:  Patient profile:   70 year old female Height:      63 inches Weight:      187.6 pounds BMI:     33.35 Pulse rate:   76 / minute BP sitting:   132 / 80  Vitals Entered By: Shary Decamp (Jun 15, 2009 10:09 AM) CC: rov   History of Present Illness: routine office visit  Diabetes -- ambulatory CBGs around 100 to 120  Hyperlipidemia-- good medication compliance   Hypertension-- ambulatory BPs 120s/ 70s    Current Medications (verified): 1)  Qvar 80 Mcg/act Aers (Beclomethasone Dipropionate) .[name].. 1 Puff Two Times A Day 2)  Spiriva Handihaler 18 Mcg  Caps (Tiotropium Bromide Monohydrate) .[name].. 1 Puff Once Daily 3)  Ventolin Hfa 108 (90 Base) Mcg/act Aers (Albuterol Sulfate) .... 2 Puffs Q 6 Hours As Needed Wheezing and Coughing 4)  Prilosec Otc 20 Mg Tbec (Omeprazole Magnesium) .... Take 1 Tablet By Mouth Once A Day 5)  Amlodipine Besylate 10 Mg Tabs (Amlodipine Besylate) .[name].. 1 By Mouth Qd 6)  Hydrochlorothiazide 25 Mg Tabs (Hydrochlorothiazide) .... Take Half  Tablet By Mouth Once A Day 7)  Metoprolol Succinate 50 Mg Tb24 (Metoprolol Succinate) .... 1/2 Every Day.   Take Extra Half As Needed For Palpitations 8)  Zocor 40 Mg Tabs (Simvastatin) .[name].. 1 1/2 By Mouth Once Daily 9)  Metformin Hcl 500 Mg Xr24h-Tab (Metformin Hcl) .... One By Mouth Daily 10)  Lyrica 100 Mg  Caps (Pregabalin) .[name].. 1 By Mouth At Bedtime Prn 11)  Etodolac Cr 400 Mg  Tb24 (Etodolac) .... One By Mouth Daily 12)  Potassium 99 Mg Tabs (Potassium) .[name].. 1 Tab Once Daily 13)  Glucosamine Chondr 1500 Complx   Caps (Glucosamine-Chondroit-Vit C-Mn) .[name].. 1 Tab Once Daily 14)  Aspirin 81 Mg Tbec (Aspirin) .... Take One Tablet By Mouth Daily 15)  Multivitamins   Tabs (Multiple Vitamin) .[name].. 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 and CT chest. Had a 9 beat NSVT Fibromyalgia  abnormal CT of the chest in 2008, follow-up CT , April 2009: Unchanged.  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 Married on disability since 2000 pt has children x 2  Patient states former smoker , quit in the late 90s ETOH--no  Review of Systems Resp:  occasionally dry cough, still occasionally chest congestion  thinks spiriva is helping  about to get a CPAP for the treatment of OSA, saw Dr Shelle Iron recently, note reviewed. GI:  Denies diarrhea, nausea, and vomiting. MS:  on lyrica for FM , patient elected to take it " as needed".  Physical Exam  General:  alert and well-developed.   Lungs:  normal respiratory effort, no intercostal retractions, no accessory muscle use, and normal breath sounds.   Heart:  normal rate, regular rhythm, and no murmur.   Extremities:  no lower extremity edema Palpation  of major muscle groups with no tenderness Psych:  not anxious appearing and not depressed appearing.     Impression & Recommendations:  Problem # 1:  HYPERTENSION (ICD-401.9) at goal  Her updated medication list for this problem includes:    Amlodipine Besylate 10 Mg Tabs (Amlodipine besylate) .[name]... 1 by mouth qd    Hydrochlorothiazide 25 Mg Tabs (Hydrochlorothiazide) .[name]... Take half  tablet by mouth once a day    Metoprolol Succinate 50 Mg Tb24 (Metoprolol succinate) .[name]... 1/2 every day.   take extra half as needed for  palpitations  BP today: 132/80 Prior BP: 118/76 (06/03/2009)  Labs Reviewed: K+: 3.8 (11/23/2008) Creat: : 0.9 (11/23/2008)   Chol: 133 (10/29/2008)   HDL: 64.20 (10/29/2008)   LDL: 58 (10/29/2008)   TG: 54.0 (10/29/2008)  Orders: TLB-BMP (Basic Metabolic Panel-BMET) (80048-METABOL)  Problem # 2:  HYPERLIPIDEMIA (ICD-272.4) well-controlled, due for LFTs Her updated medication list for this problem includes:    Zocor 40 Mg Tabs (Simvastatin) .[name]... 1 1/2 by mouth once daily  Labs Reviewed: SGOT: 25 (10/29/2008)   SGPT: 20 (10/29/2008)   HDL:64.20 (10/29/2008), 62.0 (09/25/2007)  LDL:58 (10/29/2008), 104 (81/19/1478)  Chol:133 (10/29/2008), 182 (09/25/2007)  Trig:54.0 (10/29/2008), 78 (09/25/2007)  Orders: Venipuncture (29562) TLB-ALT (SGPT) (84460-ALT) TLB-AST (SGOT) (84450-SGOT)  Problem # 3:  DIABETES MELLITUS, TYPE II (ICD-250.00) tolerating metformin well, no further hypoglycemia labs Her updated medication list for this problem includes:    Metformin Hcl 500 Mg Xr24h-tab (Metformin hcl) ..... One by mouth daily    Aspirin 81 Mg Tbec (Aspirin) .[name]... Take one tablet by mouth daily  Labs Reviewed: Creat: 0.9 (11/23/2008)    Reviewed HgBA1c results: 6.3 (10/29/2008)  6.5 (09/16/2007)  Orders: TLB-A1C / Hgb A1C (Glycohemoglobin) (83036-A1C)  Problem # 4:  FIBROMYALGIA (ICD-729.1) still has aches and pains, on statins, check a CK.  Uses Lyrica p.r.n. Her updated medication list for this problem includes:    Etodolac Cr 400 Mg Tb24 (Etodolac) ..... One by mouth daily    Aspirin 81 Mg Tbec (Aspirin) .[name]... Take one tablet by mouth daily  Orders: TLB-CK Total Only(Creatine Kinase/CPK) (82550-CK)  Complete Medication List: 1)  Qvar 80 Mcg/act Aers (Beclomethasone dipropionate) .[name].. 1 puff two times a day 2)  Spiriva Handihaler 18 Mcg Caps (Tiotropium bromide monohydrate) .[name].. 1 puff once daily 3)  Ventolin Hfa 108 (90 Base) Mcg/act Aers (Albuterol sulfate) .... 2 puffs q  6 hours as needed wheezing and coughing 4)  Prilosec Otc 20 Mg Tbec (Omeprazole magnesium) .... Take 1 tablet by mouth once a day 5)  Amlodipine Besylate 10 Mg Tabs (Amlodipine besylate) .[name].. 1 by mouth qd 6)  Hydrochlorothiazide 25 Mg Tabs (Hydrochlorothiazide) .... Take half  tablet by mouth once a day 7)  Metoprolol Succinate 50 Mg Tb24 (Metoprolol succinate) .... 1/2 every day.   take extra half as needed for palpitations 8)  Zocor 40 Mg Tabs (Simvastatin) .[name].. 1 1/2 by mouth once daily 9)  Metformin Hcl 500 Mg Xr24h-tab (Metformin hcl) .... One by mouth daily 10)  Lyrica 100 Mg Caps (Pregabalin) .[name].. 1 by mouth at bedtime prn 11)  Etodolac Cr 400 Mg Tb24 (Etodolac) .... One by mouth daily 12)  Potassium 99 Mg Tabs (Potassium) .[name].. 1 tab once daily 13)  Glucosamine Chondr 1500 Complx Caps (Glucosamine-chondroit-vit c-mn) .[name].. 1 tab once daily 14)  Aspirin 81 Mg Tbec (Aspirin) .... Take one tablet by mouth daily 15)  Multivitamins Tabs (Multiple vitamin) .[name].. 1 tab once daily  Patient  Instructions: 1)  Please schedule a follow-up appointment in 4 months .

## 2010-02-28 NOTE — Assessment & Plan Note (Signed)
Summary: follow-up on blood sugar//lch   Vital Signs:  Patient profile:   70 year old female Height:      63.2 inches Weight:      188 pounds BMI:     33.21 Pulse rate:   62 / minute BP sitting:   142 / 80  Vitals Entered By: R.Peeler CC: f/u on BS Comments No meds today Pt. left BS readings at home yester Am-P breakfast-130, A lunch-67, P-dinner-135    History of Present Illness: follow-up on diabetes   Allergies: 1)  * Seldane 2)  Ceftin (Cefuroxime Axetil) 3)  * Hycotuss Expectorant (Hydrocodone-Guaifenesin)  Past History:  Past Medical History: Reviewed history from 03/18/2009 and no changes required. 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 and CT chest. Had a 9 beat NSVT Fibromyalgia  abnormal CT of the chest in 2008, follow-up CT , April 2009: Unchanged.  No follow-up suggested  Past Surgical History: Reviewed history from 12/06/2008 and no changes required. Cervical laminectomy Hysterectomy Oophorectomy foot surgery neg cardiolite 10/05 barium swallow neg 2/04 EGD neg 9/06  Social History: Reviewed history from 10/20/2008 and no changes required. Married on disability since 2000 pt has children x 2  Patient states former smoker , quit in the late 90s ETOH--no  Review of Systems       patient called last week   regards diabetes, her sugars were slightly low, glipizide dose was decreased to half. despite that, she still has occasional CBG of 67   blood pressures within normal, her systolic blood pressure a few days ago was 135 good medication compliance in general   Physical Exam  General:  alert and well-developed.   Lungs:  normal respiratory effort, no intercostal retractions, no accessory muscle use, and normal breath sounds.   Heart:  normal rate, regular rhythm, and no murmur.     Extremities:  no lower extremity edema   Impression & Recommendations:  Problem # 1:  HYPERTENSION (ICD-401.9) no change, well control Her updated medication list for this problem includes:    Amlodipine Besylate 10 Mg Tabs (Amlodipine besylate) .[name]... 1 by mouth qd    Hydrochlorothiazide 25 Mg Tabs (Hydrochlorothiazide) .[name]... Take half  tablet by mouth once a day    Metoprolol Succinate 50 Mg Tb24 (Metoprolol succinate) .[name]... 1/2 every day may take extra half as needed for palpitations  BP today: 142/80 Prior BP: 104/60 (03/18/2009)  Labs Reviewed: K+: 3.8 (11/23/2008) Creat: : 0.9 (11/23/2008)   Chol: 133 (10/29/2008)   HDL: 64.20 (10/29/2008)   LDL: 58 (10/29/2008)   TG: 54.0 (10/29/2008)  Problem # 2:  DIABETES MELLITUS, TYPE II (ICD-250.00)   despite low dose of glipizide, she is running into low  sugars plan: discontinue glipizide, start metformin to avoid hypoglycemia side effects discussed Her updated medication list for this problem includes:    Metformin Hcl 500 Mg Xr24h-tab (Metformin hcl) ..... One by mouth daily    Aspirin 81 Mg Tbec (Aspirin) .[name]... Take one tablet by mouth daily  Labs Reviewed: Creat: 0.9 (11/23/2008)    Reviewed HgBA1c results: 6.3 (10/29/2008)  6.5 (09/16/2007)  Orders: Prescription Created Electronically [phone])  Complete Medication List: 1)  Qvar 80 Mcg/act Aers (Beclomethasone dipropionate) .[name].. 1 puff two times a day 2)  Spiriva Handihaler 18 Mcg Caps (Tiotropium bromide monohydrate) .[name].. 1 puff once daily 3)  Ventolin Hfa 108 (90 Base)  Mcg/act Aers (Albuterol sulfate) .... 2 puffs q 6 hours as needed wheezing and coughing 4)  Prilosec Otc 20 Mg Tbec (Omeprazole magnesium) .... Take 1 tablet by mouth once a day 5)  Amlodipine Besylate 10 Mg Tabs (Amlodipine besylate) .[name].. 1 by mouth qd 6)  Hydrochlorothiazide 25 Mg Tabs (Hydrochlorothiazide) .... Take half  tablet by mouth once a day 7)  Metoprolol Succinate 50 Mg Tb24 (Metoprolol  succinate) .... 1/2 every day may take extra half as needed for palpitations 8)  Zocor 40 Mg Tabs (Simvastatin) .[name].. 1 1/2 by mouth once daily 9)  Metformin Hcl 500 Mg Xr24h-tab (Metformin hcl) .... One by mouth daily 10)  Lyrica 100 Mg Caps (Pregabalin) .[name].. 1 by mouth at bedtime prn 11)  Etodolac Cr 400 Mg Tb24 (Etodolac) .... One by mouth daily 12)  Potassium 99 Mg Tabs (Potassium) .[name].. 1 tab once daily 13)  Glucosamine Chondr 1500 Complx Caps (Glucosamine-chondroit-vit c-mn) .[name].. 1 tab once daily 14)  Aspirin 81 Mg Tbec (Aspirin) .... Take one tablet by mouth daily 15)  Multivitamins Tabs (Multiple vitamin) .[name].. 1 tab once daily  Patient Instructions: 1)  stop glipizide 2)  start metformin 3)  if the  sugars are more than 180 or less than 90 please let us know 4)  Please schedule a follow-up appointment in 2 months.  Prescriptions: QVAR 80 MCG/ACT AERS (BECLOMETHASONE DIPROPIONATE) 1 puff two times a day  #1 x 12   Entered and Authorized by:   Raveen Wieseler E. Lossie Kalp MD   Signed by:   Nolon Rod. Callahan Peddie MD on 04/21/2009   Method used:   Electronically to        [facility] [address] [phone]* (retail)       [address]       [place], [place]  [address]       Ph: [phone] or [phone]       Fax: [phone]   RxID:   [id number] METFORMIN HCL 500 MG XR24H-TAB (METFORMIN HCL) one by mouth daily  #30 x 6   Entered and Authorized by:   Elita Quick E. Samier Jaco MD   Signed by:   Nolon Rod. Sufyaan Palma MD on 04/21/2009   Method used:   Electronically to        [facility] [address] [phone]* (retail)       [address]       [place], [place]  [address]       Ph: [phone] or [phone]       Fax: [phone]   RxID:   [id number]

## 2010-02-28 NOTE — Miscellaneous (Signed)
Summary: optimal cpap pressure 9cm   Clinical Lists Changes  Orders: Added new Referral order of DME Referral (DME) - Signed auto shows adequate compliance, optimal pressure of 9cm

## 2010-02-28 NOTE — Assessment & Plan Note (Signed)
Summary: NP follow up   Copy to:  Willow Ora Primary Provider/Referring Provider:  Drue Novel  CC:  2 week follow up - states breathing and sleeping has improved since last OV.  she states she took her CPAP to the home health care company and had her machine changed out.[name]  History of Present Illness: 81 yobf quit smoking in 1999 with dx of pseudoasthma / vcd in 2005 and nl sprirometry with rec for f/u ent as needed.  July 06, 2009 Followup dyspnea.  Pt last seen here in 2006.  She states that her breathing is the same as when last seen. Admitted at least once and to ER last time 2 weeks ago > "no better"  on qvar and spiriva,  maybe after albuterol but not much.   Today is one of her better days walked in 50 ft with more fatigue than sob and pft's again are perfectly nl.   Occasions where loose breath sitting still, assoc with hoarseness, dry cough.   July 20, 2009--Returns for follow up and med review. Last visit several meds were stopped, -metoprolol, spiriva, qvar, ancd norvasc. Bystolic was added. Breathing is better-much better. Dry cough is gone. Dyspnea is gone. Walking is much better, her activity tolerance is much better. Does report she was found to have  myalgias, w/ elevated CK level , zocor stopped recently. Denies chest pain, dyspnea, orthopnea, hemoptysis, fever, n/v/d, edema, headache,.   Medications Prior to Update: 1)  Prilosec Otc 20 Mg Tbec (Omeprazole Magnesium) .... Take One 30-60 Min Before First and Last Meals of The Day 2)  Hydrochlorothiazide 25 Mg Tabs (Hydrochlorothiazide) .... Take Half  Tablet By Mouth Once A Day 3)  Metformin Hcl 500 Mg Xr24h-Tab (Metformin Hcl) .... One By Mouth Daily 4)  Lyrica 100 Mg  Caps (Pregabalin) .[name].. 1 By Mouth At Bedtime Prn 5)  Etodolac Cr 400 Mg  Tb24 (Etodolac) .... One By Mouth Daily 6)  Potassium 99 Mg Tabs (Potassium) .[name].. 1 Tab Once Daily 7)  Glucosamine Chondr 1500 Complx   Caps (Glucosamine-Chondroit-Vit C-Mn) .[name].. 1 Tab Once Daily 8)   Aspirin 81 Mg Tbec (Aspirin) .... Take One Tablet By Mouth Daily 9)  Multivitamins   Tabs (Multiple Vitamin) .[name].. 1 Tab Once Daily 10)  Bystolic 10 Mg  Tabs (Nebivolol Hcl) .... One Tablet Daily 11)  Pepcid Ac Maximum Strength 20 Mg Tabs (Famotidine) .... One At Bedtime 12)  Ventolin Hfa 108 (90 Base) Mcg/act Aers (Albuterol Sulfate) .... 2 Puffs Q 6 Hours As Needed Wheezing and Coughing 13)  Ciprofloxacin Hcl 500 Mg Tabs (Ciprofloxacin Hcl) .... One By Mouth Twice A Day For 10 Days  Current Medications (verified): 1)  Prilosec Otc 20 Mg Tbec (Omeprazole Magnesium) .... Take One 30-60 Min Before First and Last Meals of The Day 2)  Hydrochlorothiazide 25 Mg Tabs (Hydrochlorothiazide) .... Take Half  Tablet By Mouth Once A Day 3)  Metformin Hcl 500 Mg Xr24h-Tab (Metformin Hcl) .... One By Mouth Daily 4)  Lyrica 100 Mg  Caps (Pregabalin) .[name].. 1 By Mouth At Bedtime Prn 5)  Etodolac Cr 400 Mg  Tb24 (Etodolac) .... One By Mouth Daily 6)  Potassium 99 Mg Tabs (Potassium) .[name].. 1 Tab Once Daily 7)  Glucosamine Chondr 1500 Complx   Caps (Glucosamine-Chondroit-Vit C-Mn) .[name].. 1 Tab Once Daily 8)  Aspirin 81 Mg Tbec (Aspirin) .... Take One Tablet By Mouth Daily 9)  Multivitamins   Tabs (Multiple Vitamin) .[name].. 1 Tab Once Daily 10)  Bystolic 10 Mg  Tabs (Nebivolol Hcl) .... One Tablet Daily 11)  Pepcid Ac Maximum Strength 20 Mg Tabs (Famotidine) .... One At Bedtime 12)  Ventolin Hfa 108 (90 Base) Mcg/act Aers (Albuterol Sulfate) .... 2 Puffs Q 6 Hours As Needed Wheezing and Coughing 13)  Ciprofloxacin Hcl 500 Mg Tabs (Ciprofloxacin Hcl) .... 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 and CT chest. Had a 9 beat NSVT Fibromyalgia  abnormal CT of the chest in 2008, follow-up CT , April 2009: Unchanged.  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  Past Surgical History: Last updated: 06/15/2009 partial thyroidectomy in the 60s Cervical laminectomy Hysterectomy Oophorectomy foot surgery neg cardiolite 10/05 barium swallow neg 2/04 EGD neg 9/06  Family History: Last updated: 12/22/2008 allergies: sister asthma: sister, paternal grandmother heart disease: father, mother, paternal grandparents, maternal grandparents, brother cancer: mother (lung)  breast ca--no colon ca--no  Social History: Last updated: 07/06/2009 Married on disability since 2000 pt has children x 2  Patient states former smoker , quit in the late 90s.  ETOH--no  Risk Factors: Smoking Status: quit (10/15/2007)  Review of Systems      See HPI  Vital Signs:  Patient profile:   70 year old female Height:      63 inches Weight:      188 pounds BMI:     33.42 O2 Sat:      98 % on Room air Temp:     97.4 degrees F oral Pulse rate:   74 / minute BP sitting:   140 / 80  (left arm) Cuff size:   regular  Vitals Entered By: Boone Master CNA/MA (July 20, 2009 10:14 AM)  O2 Flow:  Room air CC: 2 week follow up - states breathing and sleeping has improved since last OV.  she states she took her CPAP to the home health care company and had her machine changed out. Is Patient Diabetic? Yes Comments Medications reviewed with patient Daytime contact number verified with patient. Boone Master CNA/MA  July 20, 2009 10:15 AM    Physical Exam  Additional Exam:  wt 186 > 188 July 06, 2009 >>188 July 20, 2009  amb hoarse bf nad-psuedowheezing has resolved.  HEENT: nl dentition, turbinates, and orophanx. Nl external ear  canals without cough reflex NECK :  without JVD/Nodes/TM/ nl carotid upstrokes bilaterally LUNGS: no acc muscle use, clear to A and P bilaterally without cough on insp or exp maneuvers CV:  RRR  no s3 or murmur or increase in P2, no edema  ABD:  soft and nontender with nl excursion in the supine position. No bruits or organomegaly, bowel sounds nl MS:  warm without deformities, calf tenderness, cyanosis or clubbing SKIN: warm and dry without lesions   NEURO:  alert, approp, no deficits     Impression & Recommendations:  Problem # 1:  DYSPNEA ON EXERTION (ICD-786.09)  Last visit COPD excluded by nl pft's and still favor vcd/ anxiety/deconditioning   She  is much improved w/ medication changes w/ more selective betablocker, d/c of ccb and inhalers.  and GERD prevention regimen.  follow up 4 weeks  Meds reviewed with pt education and computerized med calendar completed/adjusted.     Orders: Est. Patient Level III (71062)  Problem # 2:  HYPERTENSION (ICD-401.9)  She is doing ok on new changes. b/p not quite as good.  cont on low salt diet.  follow up 4 weeks if remains elevated consider additional meds.  Her updated medication list for this problem includes:    Hydrochlorothiazide 25 Mg Tabs (Hydrochlorothiazide) .[name]... Take half  tablet by mouth once a day    Bystolic 10 Mg Tabs (Nebivolol hcl) ..... One tablet daily  Orders: Est. Patient Level III (69485)  Complete Medication List: 1)  Prilosec Otc 20 Mg Tbec (Omeprazole magnesium) .... Take one 30-60 min before first and last meals of the day 2)  Hydrochlorothiazide 25 Mg Tabs (Hydrochlorothiazide) .... Take half  tablet by mouth once a day 3)  Metformin Hcl 500 Mg Xr24h-tab (Metformin hcl) .... One by mouth daily 4)  Lyrica 100 Mg Caps (Pregabalin) .[name].. 1 by mouth at bedtime prn 5)  Etodolac Cr 400 Mg Tb24 (Etodolac) .... One by mouth daily 6)  Potassium 99 Mg Tabs (Potassium) .[name].. 1 tab once daily 7)  Glucosamine Chondr  1500 Complx Caps (Glucosamine-chondroit-vit c-mn) .[name].. 1 tab once daily 8)  Aspirin 81 Mg Tbec (Aspirin) .... Take one tablet by mouth daily 9)  Multivitamins Tabs (Multiple vitamin) .[name].. 1 tab once daily 10)  Bystolic 10 Mg Tabs (Nebivolol hcl) .... One tablet daily 11)  Pepcid Ac Maximum Strength 20 Mg Tabs (Famotidine) .... One at bedtime 12)  Ventolin Hfa 108 (90 Base) Mcg/act Aers (Albuterol sulfate) .... 2 puffs q 6 hours as needed wheezing and coughing 13)  Ciprofloxacin Hcl 500 Mg Tabs (Ciprofloxacin hcl) .... One by mouth twice a day for 10 days  Patient Instructions: 1)  Follow med calendar closely and bring to each visit.  2)  Continue on Bystolic 10mg  once daily  3)  follow up 4 weeks Dr. Sherene Sires  4)  Please contact office for sooner follow up if symptoms do not improve or worsen   Appended Document: med calendar update Medications Added LYRICA 100 MG  CAPS (PREGABALIN) 1 by mouth at bedtime PRILOSEC OTC 20 MG TBEC (OMEPRAZOLE MAGNESIUM) Take one 30-60 min before first meal of the day * CPAP wear at bedtime VENTOLIN HFA 108 (90 BASE) MCG/ACT AERS (ALBUTEROL SULFATE) 2 puffs every 4 hours as needed wheezing and coughing TYLENOL ARTHRITIS PAIN 650 MG CR-TABS (ACETAMINOPHEN) take as directed          Clinical Lists Changes  Medications: Changed medication from LYRICA 100 MG  CAPS (PREGABALIN) 1 by mouth at bedtime PRN to LYRICA 100 MG  CAPS (PREGABALIN) 1 by mouth at bedtime Changed medication from PRILOSEC OTC 20 MG TBEC (OMEPRAZOLE MAGNESIUM) Take one 30-60 min before first and last meals of the day to PRILOSEC OTC 20 MG TBEC (OMEPRAZOLE MAGNESIUM) Take one 30-60 min before first meal of the day Added new medication of * CPAP wear at bedtime Changed medication from VENTOLIN HFA 108 (90 BASE) MCG/ACT AERS (ALBUTEROL SULFATE) 2 puffs q 6 hours as needed wheezing and coughing to VENTOLIN HFA 108 (90 BASE) MCG/ACT AERS (ALBUTEROL SULFATE) 2 puffs every 4 hours as needed wheezing  and coughing Added new medication of TYLENOL ARTHRITIS PAIN 650 MG CR-TABS (ACETAMINOPHEN) take as directed

## 2010-02-28 NOTE — Assessment & Plan Note (Signed)
Summary: LEGS SWELLING/CBS   Vital Signs:  Patient profile:   70 year old female Height:      63.2 inches Weight:      188.50 pounds BMI:     33.30 Pulse rate:   64 / minute BP sitting:   120 / 70  Vitals Entered By: Kandice Hams (May 27, 2009 4:00 PM) CC: c/o swelling both legs  Comments -c/o deep joint pain legs and thighs and shoulders also feel fatigued.   History of Present Illness: c/o "extreme leg swelling"  bilaterally  for a few days, she started doing a low-salt diet with no help the only new medication that she is on is metformin for the last few weeks  also complain of a deep achy pain at the lower extremities, worse below the knees, usually at rest, no claudication  also a prickly type of pain in her feet.  No burning per se  ROS no fever no back pain no headache, no shoulder aches no  rash on her legs denies chest pain, shortness of breath some cough for one week without sputum  .  Some wheezing. 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 and CT chest. Had a 9 beat NSVT Fibromyalgia  abnormal CT of the chest in 2008, follow-up CT , April 2009: Unchanged.  No follow-up suggested  Past Surgical History: Reviewed history from 12/06/2008 and no changes required. Cervical laminectomy Hysterectomy Oophorectomy foot surgery neg cardiolite 10/05 barium swallow neg 2/04 EGD neg 9/06  Social History: Reviewed history from 10/20/2008 and no changes required. Married on disability since 2000 pt has children x 2  Patient states former smoker , quit in the late  90s ETOH--no  Review of Systems      See HPI  Physical Exam  General:  alert and well-developed.   Neck:  no JVD.   Lungs:  normal respiratory effort, no intercostal retractions, no accessory muscle use, and normal breath sounds.   Heart:  normal rate, regular rhythm, and no murmur.   Abdomen:  soft and non-tender.   Pulses:  normal bilateral pedal pulses Extremities:  no pretibial edema at all if anything, minimal swelling  distal  from the external malleolus bilaterally, symmetrically. Calves  are not tender, symmetric. no lower extremity rash   Impression & Recommendations:  Problem # 1:  ? of EDEMA (ICD-782.3) extreme  edema (per pt) not confirmed by physical exam no signs of volume overload Plan: Observation, continue doing a low-salt diet, doubt related to metformin which is the only new medication she is on Her updated medication list for this problem includes:    Hydrochlorothiazide 25 Mg Tabs (Hydrochlorothiazide) .[name]... Take half  tablet by mouth once a day  Problem # 2:  FIBROMYALGIA (ICD-729.1) lower extremitie pain  may be due to fibromyalgia, however she also has a prickling type of pain in her feet: Neuropathy? Her updated medication list for this problem includes:    Etodolac Cr 400 Mg Tb24 (Etodolac) ..... One by mouth daily    Aspirin 81 Mg Tbec (Aspirin) .[name]... Take one tablet by mouth daily  Problem #  3:  FATIGUE (ICD-780.79) chronic problem she does have the sleep apnea, see next  Problem # 4:  OBSTRUCTIVE SLEEP APNEA (ICD-327.23) she was seen by pulmonary in 2009, was recommended CPAP.  Recommend to contact pulmonary, likely she will benefit from a CPAP  Complete Medication List: 1)  Qvar 80 Mcg/act Aers (Beclomethasone dipropionate) .[name].. 1 puff two times a day 2)  Spiriva Handihaler 18 Mcg Caps (Tiotropium bromide monohydrate) .[name].. 1 puff once daily 3)  Ventolin Hfa 108 (90 Base) Mcg/act Aers (Albuterol sulfate) .... 2 puffs q 6 hours as needed  wheezing and coughing 4)  Prilosec Otc 20 Mg Tbec (Omeprazole magnesium) .... Take 1 tablet by mouth once a day 5)  Amlodipine Besylate 10 Mg Tabs (Amlodipine besylate) .[name].. 1 by mouth qd 6)  Hydrochlorothiazide 25 Mg Tabs (Hydrochlorothiazide) .... Take half  tablet by mouth once a day 7)  Metoprolol Succinate 50 Mg Tb24 (Metoprolol succinate) .... 1/2 every day may take extra half as needed for palpitations 8)  Zocor 40 Mg Tabs (Simvastatin) .[name].. 1 1/2 by mouth once daily 9)  Metformin Hcl 500 Mg Xr24h-tab (Metformin hcl) .... One by mouth daily 10)  Lyrica 100 Mg Caps (Pregabalin) .[name].. 1 by mouth at bedtime prn 11)  Etodolac Cr 400 Mg Tb24 (Etodolac) .... One by mouth daily 12)  Potassium 99 Mg Tabs (Potassium) .[name].. 1 tab once daily 13)  Glucosamine Chondr 1500 Complx Caps (Glucosamine-chondroit-vit c-mn) .[name].. 1 tab once daily 14)  Aspirin 81 Mg Tbec (Aspirin) .... Take one tablet by mouth daily 15)  Multivitamins Tabs (Multiple vitamin) .[name].. 1 tab once daily  Patient Instructions: 1)  Please schedule a follow-up appointment in 1 month.

## 2010-02-28 NOTE — Assessment & Plan Note (Signed)
Summary: f/u shingles,cbs.   Vital Signs:  Patient profile:   70 year old female Weight:      184.13 pounds Pulse rate:   70 / minute Pulse rhythm:   regular BP sitting:   144 / 90  (left arm) Cuff size:   large  Vitals Entered By: Army Fossa CMA (January 02, 2010 2:11 PM) CC: Pt here to f/u on shingles- not fasting  Comments Pain is gone. [facility] BP has been elevated.    History of Present Illness: developed a rash in the left lower back on 12-17-09 rash  was associated with pain in the lower back, left lower abdomen and even left leg. Went to the ER, was prescribed valtrex  and hydrocodone for shingles. She is doing better. Rash is drying out Pain has decreased, only has residual left foot pain.  Review of systems Denies any bladder or bowel incontinence No nausea vomiting or diarrhea Was recently seen by cardiology, BP was elevated, they added lisinopril  20 mg, she seems to be tolerating well. states that has a followup with cardiology for a BMP.  Current Medications (verified): 1)  Aspirin 81 Mg Tbec (Aspirin) .... Take One Tablet By Mouth Daily 2)  Bystolic 20 Mg Tabs (Nebivolol Hcl) .... Take 1 Tablet Every Morning 3)  Hydrochlorothiazide 25 Mg Tabs (Hydrochlorothiazide) .... Take Half  Tablet By Mouth Once A Day 4)  Metformin Hcl 500 Mg Xr24h-Tab (Metformin Hcl) .... One By Mouth Daily 5)  Lyrica 100 Mg  Caps (Pregabalin) .[name].. 1 By Mouth At Bedtime As Needed 6)  Etodolac Cr 400 Mg  Tb24 (Etodolac) .... One By Mouth Daily 7)  Potassium 99 Mg Tabs (Potassium) .[name].. 1 Tab Once Daily 8)  Multivitamins   Tabs (Multiple Vitamin) .[name].. 1 Tab Once Daily 9)  Glucosamine Chondr 1500 Complx   Caps (Glucosamine-Chondroit-Vit C-Mn) .[name].. 1 Tab Once Daily 10)  Prilosec Otc 20 Mg Tbec (Omeprazole Magnesium) .... Take One 30-60 Min Before First Meal of The Day 11)  Ventolin Hfa 108 (90 Base) Mcg/act Aers (Albuterol Sulfate) .... 2 Puffs Every 4 Hours As Needed Wheezing  and Coughing 12)  Norco 5-325 Mg Tabs (Hydrocodone-Acetaminophen) .... As Needed 13)  Lisinopril 20 Mg Tabs (Lisinopril) .... Take One Tablet By Mouth Daily  Allergies (verified): 1)  ! 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 and CT chest. Had a 9 beat NSVT Fibromyalgia  abnormal CT of the chest in 2008, follow-up CT , April 2009: Unchanged.  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 Married on disability since 2000 pt has children x 2  Patient states former smoker , quit in the late 90s.  ETOH--no  Physical Exam  General:  alert and well-developed.   Abdomen:  soft and non-tender.   Extremities:  no edema Neurologic:  alert & oriented X3, strength normal in all extremities, gait normal, and DTRs symmetrical and normal.   Skin:  few dry blisters in the left lower back  Impression & Recommendations:  Problem # 1:  SHINGLES (ICD-053.9) agree w/ dx of shingles symptoms improving, still has residual pain in the L foof unclear if related to shingles plan: observation   Problem # 2:  HYPERTENSION (ICD-401.9) ACEi recently added by cards see instructions  Her updated medication list for this problem includes:    Bystolic 20 Mg Tabs (Nebivolol hcl) .[name]... Take 1 tablet every morning    Hydrochlorothiazide 25 Mg Tabs (Hydrochlorothiazide) .[name]... Take half  tablet by mouth once a day    Lisinopril 20 Mg Tabs (Lisinopril) .[name]...  Take one tablet by mouth daily  Complete Medication List: 1)  Aspirin 81 Mg Tbec (Aspirin) .... Take one tablet by mouth daily 2)  Bystolic 20 Mg Tabs (Nebivolol hcl) .... Take 1 tablet every morning 3)  Hydrochlorothiazide 25 Mg Tabs (Hydrochlorothiazide) .... Take half  tablet by mouth once a day 4)  Metformin Hcl 500 Mg Xr24h-tab (Metformin hcl) .... One by mouth daily 5)  Lyrica 100 Mg Caps (Pregabalin) .[name].. 1 by mouth at bedtime as needed 6)  Etodolac Cr 400 Mg Tb24 (Etodolac) .... One by mouth daily 7)  Potassium 99 Mg Tabs (Potassium) .[name].. 1 tab once daily 8)  Multivitamins Tabs (Multiple vitamin) .[name].. 1 tab once daily 9)  Glucosamine Chondr 1500 Complx Caps (Glucosamine-chondroit-vit c-mn) .[name].. 1 tab once daily 10)  Prilosec Otc 20 Mg Tbec (Omeprazole magnesium) .... Take one 30-60 min before first meal of the day 11)  Ventolin Hfa 108 (90 Base) Mcg/act Aers (Albuterol sulfate) .... 2 puffs every 4 hours as needed wheezing and coughing 12)  Norco 5-325 Mg Tabs (Hydrocodone-acetaminophen) .... As needed 13)  Lisinopril 20 Mg Tabs (Lisinopril) .... Take one tablet by mouth daily  Other Orders: Flu Vaccine 40yrs + MEDICARE PATIENTS (Z6109) Administration Flu vaccine - MCR (U0454)  Patient Instructions: 1)  Please schedule a follow-up appointment for January 2012 (routine office visit) 2)  Check your blood pressure 2 or 3 times a week. If it is more than 140/85 consistently,please let us know  3)  follow up with cardiology as recommended for your BP Flu Vaccine Consent Questions     Do you have a history of severe allergic reactions to this vaccine? no    Any prior history of allergic reactions to egg and/or gelatin? no    Do you have a sensitivity to the preservative Thimersol? no    Do you have a past history of Guillan-Barre Syndrome? no    Do you currently have an acute febrile illness? no    Have you ever had a severe reaction to latex? no    Vaccine information given and  explained to patient? yes    Are you currently pregnant? no    Lot Number:[id number]   Exp Date:07/29/2010   Site Given right Deltoid IM  Orders Added: 1)  Flu Vaccine 55yrs + MEDICARE PATIENTS [Q2039] 2)  Administration Flu vaccine - MCR [G0008] 3)  Est. Patient Level III [09811]     .[email or web address]

## 2010-02-28 NOTE — Letter (Signed)
Summary: Colonoscopy-Changed to Office Visit Letter  [facility]  [address] [place], [place] [address]   Phone: [phone]  Fax: [phone]      August 25, 2009 MRN: [id number]   [facility] [address] [place], [place]  [address]   Dear Ms. [facility],   According to our records, it is time for you to schedule a Colonoscopy. However, after reviewing your medical record, I feel that an office visit would be most appropriate to more completely evaluate you and determine your need for a repeat procedure.  Please call [phone] (option #2) at your convenience to schedule an office visit. If you have any questions, concerns, or feel that this letter is in error, we would appreciate your call.   Sincerely,  Rachael Fee, M.D.  [facility] [phone]

## 2010-02-28 NOTE — Assessment & Plan Note (Signed)
Summary: BACK PAIN/CBS   Vital Signs:  Patient profile:   70 year old female Height:      63 inches Weight:      189 pounds Temp:     98.2 degrees F oral Pulse rate:   72 / minute BP sitting:   130 / 72  (left arm)  Vitals Entered By: Jeremy Johann CMA (July 19, 2009 3:29 PM) CC: pain in left lower back x1day Comments --frequent urination  REVIEWED MED LIST, PATIENT AGREED DOSE AND INSTRUCTION CORRECT    History of Present Illness: developed left mid back and  flank pain yesterday, the pain was steady  and intense No radiation, did not take any medication This morning the pain is better She also had urinary frequency  Review of systems Denies nausea vomiting Mild subjective fever No gross hematuria No abdominal pain Denies history of kidney stones before Her CBG was low yesterday  Allergies: 1)  * Seldane 2)  Ceftin (Cefuroxime Axetil) 3)  * Hycotuss Expectorant (Hydrocodone-Guaifenesin)  Past History:  Past Medical History: Reviewed history from 07/06/2009 and no changes required. 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 and CT chest. Had a 9 beat NSVT Fibromyalgia  abnormal CT of the chest in 2008, follow-up CT , April 2009: Unchanged.  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 partial thyroidectomy in the 60s Cervical laminectomy Hysterectomy Oophorectomy foot surgery neg cardiolite 10/05 barium swallow neg 2/04 EGD neg 9/06  Social History: Reviewed history from 07/06/2009 and no changes required. Married on disability since 2000 pt has children x 2  Patient states former smoker , quit in the  late 90s.  ETOH--no  Physical Exam  General:  alert and well-developed.  no distress Abdomen:  soft.  slightly tender at the left lower quadrant and distal area of the left flank. No mass, no rebound, good bowel sounds. Questionable, mild left CVA tenderness to percussion Neurologic:  gait normal   Impression & Recommendations:  Problem # 1:  UTI (ICD-599.0)  urine culture done in May (at the ER) showed KLEBSIELLA OXYTOCA pansensitive. At that time she was prescribed 5 days of Bactrim Today she has flank pain, a urine consistent with UTI Likely dx is  UTI. A kidney stone cannot be rule out, if she has more pain she will need  a CT or ultrasound Plan: Fluids, rest, urine culture, Cipro  for 10 days and reculture in 3  weeks  Her updated medication list for this problem includes:    Ciprofloxacin Hcl 500 Mg Tabs (Ciprofloxacin hcl) ..... One by mouth twice a day for 10 days  Problem # 2:  DIABETES MELLITUS, TYPE II (ICD-250.00)  reports low blood sugar yesterday, CBG  today 96 Her updated medication list for this problem includes:    Metformin Hcl 500 Mg Xr24h-tab (Metformin hcl) ..... One by mouth daily    Aspirin 81 Mg Tbec (Aspirin) .[name]... Take one tablet by mouth daily  Labs Reviewed: Creat: 0.8 (07/06/2009)    Reviewed HgBA1c results: 6.4 (06/15/2009)  6.3 (10/29/2008)  Orders: Fingerstick (36416) Glucose, (CBG) (08657)  Complete Medication List: 1)  Prilosec Otc 20  Mg Tbec (Omeprazole magnesium) .... Take one 30-60 min before first and last meals of the day 2)  Hydrochlorothiazide 25 Mg Tabs (Hydrochlorothiazide) .... Take half  tablet by mouth once a day 3)  Metformin Hcl 500 Mg Xr24h-tab (Metformin hcl) .... One by mouth daily 4)  Lyrica 100 Mg Caps (Pregabalin) .[name].. 1 by mouth at bedtime prn 5)  Etodolac Cr 400 Mg Tb24 (Etodolac) .... One by mouth daily 6)  Potassium 99 Mg Tabs (Potassium) .[name].. 1 tab once daily 7)  Glucosamine Chondr 1500 Complx Caps  (Glucosamine-chondroit-vit c-mn) .[name].. 1 tab once daily 8)  Aspirin 81 Mg Tbec (Aspirin) .... Take one tablet by mouth daily 9)  Multivitamins Tabs (Multiple vitamin) .[name].. 1 tab once daily 10)  Bystolic 10 Mg Tabs (Nebivolol hcl) .... One tablet daily 11)  Pepcid Ac Maximum Strength 20 Mg Tabs (Famotidine) .... One at bedtime 12)  Ventolin Hfa 108 (90 Base) Mcg/act Aers (Albuterol sulfate) .... 2 puffs q 6 hours as needed wheezing and coughing 13)  Ciprofloxacin Hcl 500 Mg Tabs (Ciprofloxacin hcl) .... One by mouth twice a day for 10 days  Other Orders: Specimen Handling (84696) T-Culture, Urine (29528-41324) T-Urine Microscopic (40102-72536) UA Dipstick w/o Micro (manual) (64403)  Patient Instructions: 1)  drink plenty of fluids 2)  Ciprofloxacin for 10 days 3)  Tylenol for pain 4)  Come back in 3  weeks for a repeat urine culture,  DX UTI 5)  Call any time if you are not better or if you feel worse Prescriptions: CIPROFLOXACIN HCL 500 MG TABS (CIPROFLOXACIN HCL) one by mouth twice a day for 10 days  #20 x 0   Entered and Authorized by:   Nolon Rod. Javani Spratt MD   Signed by:   Nolon Rod. Yariana Hoaglund MD on 07/19/2009   Method used:   [facility]        [facility] [address] [phone][facility] (retail)       [address]       [place], [place]  [address]       Ph: [phone] or [phone]       Fax: [phone]   RxID:   [id number]   Laboratory Results   Urine Tests   Date/Time Reported: July 19, 2009 3:31 PM  Routine Urinalysis   Color: yellow Appearance: Clear Glucose: negative   (Normal Range: Negative) Bilirubin: negative   (Normal Range: Negative) Ketone: negative   (Normal Range: Negative) Spec. Gravity: 1.010   (Normal Range: 1.003-1.035) Blood: small   (Normal Range: Negative) pH: 5.0   (Normal Range: 5.0-8.0) Protein: negative   (Normal Range: Negative) Urobilinogen: 0.2   (Normal Range: 0-1) Nitrite: negative   (Normal Range: Negative) Leukocyte  Esterace: negative   (Normal Range: Negative)

## 2010-02-28 NOTE — Assessment & Plan Note (Signed)
Summary: WENT TO URG CARE FOR TONSILITIS--NEEDS FOLLOWUP///SPH   Vital Signs:  Patient profile:   70 year old female Weight:      182.13 pounds Temp:     97.0 degrees F oral Pulse rate:   75 / minute BP sitting:   138 / 88  (left arm) Cuff size:   regular  Vitals Entered By: Army Fossa CMA (October 05, 2009 12:55 PM) CC: Pt here f/u from UC for tonsilits- finished ATB yesterday Comments Was taking Azithryomycin 500 mg daily. Also having severe HA's was taking Tramadol, made her very sick, vomitting, nauseas.    History of Present Illness: was seen at the urgent care a few days ago had L>R  tonsil swelling, redness and discharge Was prescribed antibiotics, she is much better  She also has one month history of increase of his mild chronic neck pain. The pain has become severe, located at the upper posterior neck and radiates up to the head. Pain is worse when she moves her neck certain ways, particularly if she turns her head to the left At the UC  she was prescribed a soft collar and that has helped She also was prescribed Ultram but she couldn't take because it upset her stomach. No migraines per se, no associated nausea vomiting per se ( rather the GI symptoms were related to Ultram)  urgent care chart was  reviewed, no labs or x-rays were done except  negative strep test  ROS Denies fevers No cough or chest congestion Having some ill-defined tingling in her hands bilaterally or few weeks No bladder or bowel incontinence   Current Medications (verified): 1)  Aspirin 81 Mg Tbec (Aspirin) .... Take One Tablet By Mouth Daily 2)  Bystolic 10 Mg  Tabs (Nebivolol Hcl) .... One Tablet Daily 3)  Hydrochlorothiazide 25 Mg Tabs (Hydrochlorothiazide) .... Take Half  Tablet By Mouth Once A Day 4)  Metformin Hcl 500 Mg Xr24h-Tab (Metformin Hcl) .... One By Mouth Daily 5)  Lyrica 100 Mg  Caps (Pregabalin) .[name].. 1 By Mouth At Bedtime 6)  Etodolac Cr 400 Mg  Tb24 (Etodolac) ....  One By Mouth Daily 7)  Potassium 99 Mg Tabs (Potassium) .[name].. 1 Tab Once Daily 8)  Multivitamins   Tabs (Multiple Vitamin) .[name].. 1 Tab Once Daily 9)  Glucosamine Chondr 1500 Complx   Caps (Glucosamine-Chondroit-Vit C-Mn) .[name].. 1 Tab Once Daily 10)  Prilosec Otc 20 Mg Tbec (Omeprazole Magnesium) .... Take One 30-60 Min Before First Meal of The Day 11)  Cpap .... Wear At Bedtime 12)  Ventolin Hfa 108 (90 Base) Mcg/act Aers (Albuterol Sulfate) .... 2 Puffs Every 4 Hours As Needed Wheezing and Coughing 13)  Tylenol Arthritis Pain 650 Mg Cr-Tabs (Acetaminophen) .... Take As Directed  Allergies: 1)  ! * Tramadol 2)  * Seldane 3)  Ceftin (Cefuroxime Axetil) 4)  * Hycotuss Expectorant (Hydrocodone-Guaifenesin)  Past History:  Past Surgical History: partial thyroidectomy in the 60s Cervical laminectomy, in [place] , 2001  Hysterectomy Oophorectomy foot surgery neg cardiolite 10/05 barium swallow neg 2/04 EGD neg 9/06  Social History: Reviewed history from 07/06/2009 and no changes required. Married on disability since 2000 pt has children x 2  Patient states former smoker , quit in the late 90s.  ETOH--no  Review of Systems      See HPI  Physical Exam  General:  alert and well-developed.   Mouth:  oropharynx is completely normal, no swelling, redness, discharge. Tonsils are symmetric and small Neck:  neck range of motion limited particularly to extension and when she turns to the left. Nontender to palpation Extremities:  no lower extremity edema Neurologic:  alert & oriented X3, strength normal in all extremities, gait normal, and DTRs symmetrical and normal.     Impression & Recommendations:  Problem # 1:  TONSILLITIS (ICD-463) resoved   Problem # 2:  NECK PAIN (ICD-723.1) Assessment: New acute on chronic neck pain Status post neck surgery in 2001 while living in [place] to Ultram plan: Orthopedic surgical referral  Her updated medication list for this  problem includes:    Aspirin 81 Mg Tbec (Aspirin) .[name]... Take one tablet by mouth daily    Etodolac Cr 400 Mg Tb24 (Etodolac) ..... One by mouth daily    Tylenol Arthritis Pain 650 Mg Cr-tabs (Acetaminophen) .[name]... Take as directed  Orders: Orthopedic Referral (Ortho)  Problem # 3:  HYPERTENSION (ICD-401.9) at goal  Her updated medication list for this problem includes:    Bystolic 10 Mg Tabs (Nebivolol hcl) ..... One tablet daily    Hydrochlorothiazide 25 Mg Tabs (Hydrochlorothiazide) .[name]... Take half  tablet by mouth once a day  BP today: 138/88 Prior BP: 130/86 (09/13/2009)  Labs Reviewed: K+: 4.0 (07/06/2009) Creat: : 0.8 (07/06/2009)   Chol: 133 (10/29/2008)   HDL: 64.20 (10/29/2008)   LDL: 58 (10/29/2008)   TG: 54.0 (10/29/2008)  Problem # 4:  DIABETES MELLITUS, TYPE II (ICD-250.00) due for labs Her updated medication list for this problem includes:    Aspirin 81 Mg Tbec (Aspirin) .[name]... Take one tablet by mouth daily    Metformin Hcl 500 Mg Xr24h-tab (Metformin hcl) ..... One by mouth daily  Labs Reviewed: Creat: 0.8 (07/06/2009)    Reviewed HgBA1c results: 6.4 (06/15/2009)  6.3 (10/29/2008)  Orders: Venipuncture ([address]) TLB-A1C / Hgb A1C (Glycohemoglobin) (83036-A1C) Specimen Handling (60454)  Problem # 5:  HYPERLIPIDEMIA (ICD-272.4) holding statins, CK is decreased from the 500 to  the 400 range  unclear if"aches" improved by holding statins (  "I'm not getting that swollen-fatigue, overall I feel betterl") Labs Reviewed: SGOT: 25 (06/15/2009)   SGPT: 22 (06/15/2009)   HDL:64.20 (10/29/2008), 62.0 (09/25/2007)  LDL:58 (10/29/2008), 104 (09/81/1914)  Chol:133 (10/29/2008), 182 (09/25/2007)  Trig:54.0 (10/29/2008), 78 (09/25/2007)  Orders: TLB-CK Total Only(Creatine Kinase/CPK) (82550-CK) Specimen Handling ([address]295)  Problem # 6:  declined a flu shot today, states will RTC within few weeks   Complete Medication List: 1)  Aspirin 81 Mg Tbec (Aspirin) .... Take one  tablet by mouth daily 2)  Bystolic 10 Mg Tabs (Nebivolol hcl) .... One tablet daily 3)  Hydrochlorothiazide 25 Mg Tabs (Hydrochlorothiazide) .... Take half  tablet by mouth once a day 4)  Metformin Hcl 500 Mg Xr24h-tab (Metformin hcl) .... One by mouth daily 5)  Lyrica 100 Mg Caps (Pregabalin) .[name].. 1 by mouth at bedtime 6)  Etodolac Cr 400 Mg Tb24 (Etodolac) .... One by mouth daily 7)  Potassium 99 Mg Tabs (Potassium) .[name].. 1 tab once daily 8)  Multivitamins Tabs (Multiple vitamin) .[name].. 1 tab once daily 9)  Glucosamine Chondr 1500 Complx Caps (Glucosamine-chondroit-vit c-mn) .[name].. 1 tab once daily 10)  Prilosec Otc 20 Mg Tbec (Omeprazole magnesium) .... Take one 30-60 min before first meal of the day 11)  Cpap  .... Wear at bedtime 12)  Ventolin Hfa 108 (90 Base) Mcg/act Aers (Albuterol sulfate) .... 2 puffs every 4 hours as needed wheezing and coughing 13)  Tylenol Arthritis Pain 650 Mg Cr-tabs (Acetaminophen) .... Take as directed  Patient Instructions: 1)  Please schedule a follow-up appointment in 4  months .

## 2010-02-28 NOTE — Assessment & Plan Note (Signed)
Summary: followup on uti/kn   Vital Signs:  Patient profile:   70 year old female Weight:      183.38 pounds Pulse rate:   74 / minute Pulse rhythm:   regular BP sitting:   130 / 86  (left arm) Cuff size:   regular  Vitals Entered By: Army Fossa CMA (September 13, 2009 12:06 PM) CC: f/u on UTI Comments still having burning, seeing blood.   History of Present Illness: was seen 07-19-09, since then she had 2 positive urine cultures and was treated appropriately She is not feeling any better, continue with mild urinary burning Continue with CVA discomfort Last week, she saw "traces of blood coming from the urethra"    Current Medications (verified): 1)  Aspirin 81 Mg Tbec (Aspirin) .... Take One Tablet By Mouth Daily 2)  Bystolic 10 Mg  Tabs (Nebivolol Hcl) .... One Tablet Daily 3)  Hydrochlorothiazide 25 Mg Tabs (Hydrochlorothiazide) .... Take Half  Tablet By Mouth Once A Day 4)  Metformin Hcl 500 Mg Xr24h-Tab (Metformin Hcl) .... One By Mouth Daily 5)  Lyrica 100 Mg  Caps (Pregabalin) .[name].. 1 By Mouth At Bedtime 6)  Etodolac Cr 400 Mg  Tb24 (Etodolac) .... One By Mouth Daily 7)  Potassium 99 Mg Tabs (Potassium) .[name].. 1 Tab Once Daily 8)  Multivitamins   Tabs (Multiple Vitamin) .[name].. 1 Tab Once Daily 9)  Glucosamine Chondr 1500 Complx   Caps (Glucosamine-Chondroit-Vit C-Mn) .[name].. 1 Tab Once Daily 10)  Prilosec Otc 20 Mg Tbec (Omeprazole Magnesium) .... Take One 30-60 Min Before First Meal of The Day 11)  Pepcid Ac Maximum Strength 20 Mg Tabs (Famotidine) .... One At Bedtime 12)  Cpap .... Wear At Bedtime 13)  Ventolin Hfa 108 (90 Base) Mcg/act Aers (Albuterol Sulfate) .... 2 Puffs Every 4 Hours As Needed Wheezing and Coughing 14)  Tylenol Arthritis Pain 650 Mg Cr-Tabs (Acetaminophen) .... Take As Directed  Allergies: 1)  * Seldane 2)  Ceftin (Cefuroxime Axetil) 3)  * Hycotuss Expectorant (Hydrocodone-Guaifenesin)  Past History:  Past Medical History: Reviewed history from  08/24/2009 and no changes required. 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 and CT chest. Had a 9 beat NSVT Fibromyalgia  abnormal CT of the chest in 2008, follow-up CT , April 2009: Unchanged.  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 Married on disability since 2000 pt has children x 2  Patient states former smoker , quit in the late 90s.  ETOH--no  Review of Systems       low-grade fever? Occasional nausea, no vomiting No vaginal discharge, vaginal rash.  Physical Exam  General:  alert and well-developed.   Abdomen:  soft, non-tender, no distention, no masses, no guarding, and no rigidity.   Genitalia:  normal introitus, no external lesions, no vaginal discharge, and mucosa pink and moist.  bimanual exam showed no mass or tenderness   Impression & Recommendations:  Problem # 1:  UTI'S, RECURRENT (ICD-599.0) -recurrent UTIs, 5/11 (at the ER),  6/11 and 7-11 -The patient also reports at UTI 1/11 at her gynecologist -she has been treated with the appropriate antibiotics, based on the urine  cultures and sensitivity -at the time of the ER evaluation on 5/11 she was also  complaining of weakness, aches, swelling, diarrhea and fatigue; unclear if all these symptoms are related with the  UTI  Plan: Urine culture, antibiotics with results Renal ultrasound with post-void bladder scan Urology referral for further workup     Orders: UA Dipstick w/o Micro (automated)  (81003) T-Urine Microscopic ([id number]) T-Culture, Urine ([id number]) Urology Referral (Urology) Radiology Referral  (Radiology)  Complete Medication List: 1)  Aspirin 81 Mg Tbec (Aspirin) .... Take one tablet by mouth daily 2)  Bystolic 10 Mg Tabs (Nebivolol hcl) .... One tablet daily 3)  Hydrochlorothiazide 25 Mg Tabs (Hydrochlorothiazide) .... Take half  tablet by mouth once a day 4)  Metformin Hcl 500 Mg Xr24h-tab (Metformin hcl) .... One by mouth daily 5)  Lyrica 100 Mg Caps (Pregabalin) .[name].. 1 by mouth at bedtime 6)  Etodolac Cr 400 Mg Tb24 (Etodolac) .... One by mouth daily 7)  Potassium 99 Mg Tabs (Potassium) .[name].. 1 tab once daily 8)  Multivitamins Tabs (Multiple vitamin) .[name].. 1 tab once daily 9)  Glucosamine Chondr 1500 Complx Caps (Glucosamine-chondroit-vit c-mn) .[name].. 1 tab once daily 10)  Prilosec Otc 20 Mg Tbec (Omeprazole magnesium) .... Take one 30-60 min before first meal of the day 11)  Pepcid Ac Maximum Strength 20 Mg Tabs (Famotidine) .... One at bedtime 12)  Cpap  .... Wear at bedtime 13)  Ventolin Hfa 108 (90 Base) Mcg/act Aers (Albuterol sulfate) .... 2 puffs every 4 hours as needed wheezing and coughing 14)  Tylenol Arthritis Pain 650 Mg Cr-tabs (Acetaminophen) .... Take as directed  Patient Instructions: 1)  keep your appointment next month  Laboratory Results   Urine Tests  Date/Time Received: Lucious Groves [facility]  September 13, 2009 12:07 PM Date/Time Reported: Lucious Groves CMA  September 13, 2009 12:07 PM  Routine Urinalysis   Color: lt. yellow Appearance: Clear Glucose: negative   (Normal Range: Negative) Bilirubin: negative   (Normal Range: Negative) Ketone: trace (5)   (Normal Range: Negative) Spec. Gravity: 1.010   (Normal Range: 1.003-1.035) Blood: moderate   (Normal Range: Negative) pH: 7.0   (Normal Range: 5.0-8.0) Protein: negative   (Normal Range: Negative) Urobilinogen: 0.2   (Normal Range: 0-1) Nitrite: negative   (Normal Range: Negative) Leukocyte Esterace: negative   (Normal Range: Negative)

## 2010-02-28 NOTE — Letter (Signed)
Summary: CMN for CPAP/[facility]  CMN for CPAP/[facility]   Imported By: Lanelle Bal 07/15/2009 10:11:44  _____________________________________________________________________  External Attachment:    Type:   Image     Comment:   External Document

## 2010-02-28 NOTE — Assessment & Plan Note (Signed)
Summary: yearly/sl  Medications Added BYSTOLIC 20 MG TABS (NEBIVOLOL HCL) Take 1 tablet every morning LYRICA 100 MG  CAPS (PREGABALIN) 1 by mouth at bedtime as needed LISINOPRIL 20 MG TABS (LISINOPRIL) Take one tablet by mouth daily        Visit Type:  1 yr f/u Referring Provider:  Willow Ora Primary Provider:  Nolon Rod. Paz MD  CC:  palpitations today....Marland Kitchenedema/ankles....denies any cp or sob.  History of Present Illness: Samantha [name] comes in today for evaluation and management of her history of atypical chest pain and multiple cardiac risk factors.  She recently was diagnosed with shingles of the mid thorax. She has been in a lot of pain. She denies any angina or ischemic symptoms. There is no orthopnea PND and her edema has improved.  Her blood pressures been high since she was switched to bystolic with systolic pressures running in the 150s.    Current Medications (verified): 1)  Aspirin 81 Mg Tbec (Aspirin) .... Take One Tablet By Mouth Daily 2)  Bystolic 10 Mg  Tabs (Nebivolol Hcl) .... One Tablet Daily 3)  Hydrochlorothiazide 25 Mg Tabs (Hydrochlorothiazide) .... Take Half  Tablet By Mouth Once A Day 4)  Metformin Hcl 500 Mg Xr24h-Tab (Metformin Hcl) .... One By Mouth Daily 5)  Lyrica 100 Mg  Caps (Pregabalin) .[name].. 1 By Mouth At Bedtime As Needed 6)  Etodolac Cr 400 Mg  Tb24 (Etodolac) .... One By Mouth Daily 7)  Potassium 99 Mg Tabs (Potassium) .[name].. 1 Tab Once Daily 8)  Multivitamins   Tabs (Multiple Vitamin) .[name].. 1 Tab Once Daily 9)  Glucosamine Chondr 1500 Complx   Caps (Glucosamine-Chondroit-Vit C-Mn) .[name].. 1 Tab Once Daily 10)  Prilosec Otc 20 Mg Tbec (Omeprazole Magnesium) .... Take One 30-60 Min Before First Meal of The Day 11)  Ventolin Hfa 108 (90 Base) Mcg/act Aers (Albuterol Sulfate) .... 2 Puffs Every 4 Hours As Needed Wheezing and Coughing 12)  Norco 5-325 Mg Tabs (Hydrocodone-Acetaminophen) .... As Needed  Allergies: 1)  ! 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 and CT chest. Had a 9 beat NSVT Fibromyalgia  abnormal CT of the chest in 2008, follow-up CT , April 2009: Unchanged.  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  Past Surgical History: Last updated: 10/05/2009 partial thyroidectomy in the 60s Cervical laminectomy, in [place] , 2001  Hysterectomy Oophorectomy foot surgery neg cardiolite 10/05 barium swallow neg 2/04 EGD neg 9/06  Family History: Last updated: 12/22/2008 allergies: sister asthma: sister, paternal grandmother heart disease: father, mother, paternal grandparents, maternal grandparents, brother cancer: mother (lung)  breast ca--no colon ca--no  Social History: Last updated: 07/06/2009 Married on disability since 2000 pt has children x 2  Patient states former smoker , quit in the late 90s.  ETOH--no  Risk Factors: Smoking Status: quit (10/15/2007)  Review of Systems       negative other than history of present illness  Vital Signs:  Patient profile:   70 year old female Height:      63 inches Weight:  182.50 pounds BMI:     32.45 Pulse rate:   76 / minute Pulse rhythm:   irregular BP sitting:   152 / 96  (left arm) Cuff size:   large  Vitals Entered By: Danielle Rankin, CMA (December 28, 2009 9:07 AM)  Physical Exam  General:  obese.   Head:  normocephalic and atraumatic Eyes:  glasses otherwise normal except for arcus senilis Neck:  Neck supple, no JVD. No masses, thyromegaly or abnormal cervical  nodes. Chest Wall:  no deformities or breast masses noted Lungs:  Clear bilaterally to auscultation and percussion. Heart:  PMI nondisplaced, regular rate and rhythm, normal S1-S2, no carotid bruits Msk:  Back normal, normal gait. Muscle strength and tone normal. Pulses:  pulses normal in all 4 extremities Extremities:  No clubbing or cyanosis. Neurologic:  Alert and oriented x 3. Skin:  Intact without lesions or rashes. Psych:  Normal affect.   Impression & Recommendations:  Problem # 1:  HYPERTENSION (ICD-401.9) Assessment Deteriorated  She Is not close to goal as a diabetic. She is also not on ACE inhibitor. We'll start lisinopril 20 mg per day. Check electrolytes and weak. Increased diastolics 20 mg p.o. q. day. Salt restriction and weight reduction stressed Her updated medication list for this problem includes:    Aspirin 81 Mg Tbec (Aspirin) .[name]... Take one tablet by mouth daily    Bystolic 20 Mg Tabs (Nebivolol hcl) .[name]... Take 1 tablet every morning    Hydrochlorothiazide 25 Mg Tabs (Hydrochlorothiazide) .[name]... Take half  tablet by mouth once a day    Lisinopril 20 Mg Tabs (Lisinopril) .[name]... Take one tablet by mouth daily  Orders: EKG w/ Interpretation (93000)  Problem # 2:  DIABETES MELLITUS, TYPE II (ICD-250.00) Emphasized diet controlled to avoid micro-and macrovascular damage. Her updated medication list for this problem includes:    Aspirin 81 Mg Tbec (Aspirin) .[name]... Take one tablet by mouth daily    Metformin Hcl 500 Mg Xr24h-tab (Metformin hcl) ..... One by mouth daily    Lisinopril 20 Mg Tabs (Lisinopril) .[name]... Take one tablet by mouth daily  Patient Instructions: 1)  Your physician recommends that you schedule a follow-up appointment in: 4 weeks  December 28,11 10:45am 2)  Your physician recommends that you return for lab work  3)  Your physician has recommended you make the following change in your medication:  4)  Your physician has requested that you regularly  monitor and record your blood pressure readings at home.  Please use the same machine at the same time of day to check your readings and record them to bring to your follow-up visit.  goal bp is 130/80 Prescriptions: LISINOPRIL 20 MG TABS (LISINOPRIL) Take one tablet by mouth daily  #30 x 11   Entered by:   Lisabeth Devoid RN   Authorized by:   Gaylord Shih, MD, [facility]   Signed by:   Lisabeth Devoid RN on 12/28/2009   Method used:   Electronically to        [facility]  [address] [phone]* (retail)       [address]       [place], [place]  [address]       Ph: [phone] or [phone]       Fax: [phone]   RxID:   [id number] BYSTOLIC 20 MG TABS (NEBIVOLOL HCL) Take 1 tablet every morning  #30 x 11   Entered by:   Lisabeth Devoid  RN   Authorized by:   Gaylord Shih, MD, [facility]   Signed by:   Lisabeth Devoid RN on 12/28/2009   Method used:   [facility]        [facility]  [address] [phone]* (retail)       [address]       [place], [place]  [address]       Ph: [phone] or [phone]       Fax: [phone]   RxID:   [id number]

## 2010-02-28 NOTE — Progress Notes (Signed)
Summary: bystolic refill x 1> defer to PCP for future refills  Phone Note Call from Patient Call back at Home Phone [phone]   Caller: Patient Call For: wert Reason for Call: Refill Medication Summary of Call: Need refill for bystolic 10mg .//[facility] Initial call taken by: Darletta Moll,  September 09, 2009 3:17 PM  Follow-up for Phone Call        Spoke with pt and advised okay x 1 month supply and should get future rx for this through her PCP.  Pt verbalized understanding.  Rx was sent to pharm. Follow-up by: Vernie Murders,  September 09, 2009 3:23 PM    Prescriptions: BYSTOLIC 10 MG  TABS (NEBIVOLOL HCL) One tablet daily  #30 x 0   Entered by:   Vernie Murders   Authorized by:   Nyoka Cowden MD   Signed by:   Vernie Murders on 09/09/2009   Method used:   [facility]        [facility]  [address] [phone]* (retail)       [address]       [place], [place]  [address]       Ph: [phone] or [phone]       Fax: [phone]   RxID:   [id number]

## 2010-02-28 NOTE — Progress Notes (Signed)
Summary: Bystolic refill  Phone Note Call from Patient   Summary of Call: Patient called to check on the status of her Bystolic refills. I made her aware that it was done by Dr. Sherene Sires, patient tried to put pharmacist on the phone who refused stating that our office would have to call them. I made her aware that I would print the approval and fax it to the pharmacy. Initial call taken by: Lucious Groves CMA,  November 02, 2009 9:43 AM

## 2010-03-01 ENCOUNTER — Telehealth (INDEPENDENT_AMBULATORY_CARE_PROVIDER_SITE_OTHER): Payer: Self-pay | Admitting: *Deleted

## 2010-03-02 NOTE — Assessment & Plan Note (Signed)
Summary: f12m/dfg  Medications Added PEPCID 20 MG TABS (FAMOTIDINE) as needed in the eveing        Visit Type:  1 mo f/u Referring Jaythan Hinely:  Willow Ora Primary Anthon Harpole:  Nolon Rod. Paz MD  CC:  pt states she has had some epigastric pain after eating so she started taking her pepcid in the eveing now.....denies any other complaints today.  History of Present Illness: Mrs Edgington returns today for hypertension and multiple cardiac risk factors. We started on lisinopril 20 mg a day so increased her Bystolic.   She feels better. Her blood pressures excellent today. Followup blood work was stable.  Current Medications (verified): 1)  Aspirin 81 Mg Tbec (Aspirin) .... Take One Tablet By Mouth Daily 2)  Bystolic 20 Mg Tabs (Nebivolol Hcl) .... Take 1 Tablet Every Morning 3)  Hydrochlorothiazide 25 Mg Tabs (Hydrochlorothiazide) .... Take Half  Tablet By Mouth Once A Day 4)  Metformin Hcl 500 Mg Xr24h-Tab (Metformin Hcl) .... One By Mouth Daily 5)  Lyrica 100 Mg  Caps (Pregabalin) .[name].. 1 By Mouth At Bedtime As Needed 6)  Etodolac Cr 400 Mg  Tb24 (Etodolac) .... One By Mouth Daily 7)  Potassium 99 Mg Tabs (Potassium) .[name].. 1 Tab Once Daily 8)  Multivitamins   Tabs (Multiple Vitamin) .[name].. 1 Tab Once Daily 9)  Glucosamine Chondr 1500 Complx   Caps (Glucosamine-Chondroit-Vit C-Mn) .[name].. 1 Tab Once Daily 10)  Prilosec Otc 20 Mg Tbec (Omeprazole Magnesium) .... Take One 30-60 Min Before First Meal of The Day 11)  Ventolin Hfa 108 (90 Base) Mcg/act Aers (Albuterol Sulfate) .... 2 Puffs Every 4 Hours As Needed Wheezing and Coughing 12)  Norco 5-325 Mg Tabs (Hydrocodone-Acetaminophen) .... As Needed 13)  Lisinopril 20 Mg Tabs (Lisinopril) .... Take One Tablet By Mouth Daily 14)  Pepcid 20 Mg Tabs (Famotidine) .... As Needed in The [place]  Allergies: 1)  ! 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 and CT chest. Had a 9 beat NSVT Fibromyalgia  abnormal CT of the chest in 2008, follow-up CT , April 2009: Unchanged.  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 shingles, 11-11  Past Surgical History: Last updated: 10/05/2009 partial thyroidectomy in the 60s Cervical laminectomy, in [place] , 2001  Hysterectomy Oophorectomy foot surgery neg cardiolite 10/05 barium swallow neg 2/04 EGD neg 9/06  Family History: Last updated: 12/22/2008 allergies: sister asthma: sister, paternal grandmother heart disease: father, mother, paternal grandparents, maternal grandparents, brother cancer: mother (lung)  breast ca--no colon ca--no  Social History: Last updated: 07/06/2009 Married on disability since 2000 pt has children x 2  Patient states former smoker , quit in the late 90s.  ETOH--no  Risk Factors: Smoking Status: quit (10/15/2007)  Review of Systems       negative other than history of present illness  Vital Signs:  Patient profile:   70 year old female Height:      63 inches Weight:      180.50 pounds BMI:     32.09 Pulse rate:   68 /  minute Pulse rhythm:   regular BP sitting:   120 / 78  (left arm) Cuff size:   large  Vitals Entered By: Danielle Rankin, CMA (January 25, 2010 10:48 AM)  Physical Exam  General:  Well developed, well nourished, in no acute distress. Head:  normocephalic and atraumatic Eyes:  PERRLA/EOM intact; conjunctiva and lids normal. Chest Wall:  no deformities or breast masses noted Lungs:  Clear bilaterally to auscultation and percussion. Heart:   regular rate and rhythm, normal S1-S2, soft systolic murmur along left sternal border Msk:  Back normal, normal gait. Muscle strength and tone normal. Pulses:  pulses normal in all 4 extremities Extremities:  No clubbing or cyanosis. Neurologic:  Alert and oriented x 3. Skin:  Intact without lesions or rashes. Psych:  Normal affect.   Impression & Recommendations:  Problem # 1:  HYPERTENSION (ICD-401.9) Assessment Improved  Her updated medication list for this problem includes:    Aspirin 81 Mg Tbec (Aspirin) .[name]... Take one tablet by mouth daily    Bystolic 20 Mg Tabs (Nebivolol hcl) .[name]... Take 1 tablet every morning    Hydrochlorothiazide 25 Mg Tabs (Hydrochlorothiazide) .[name]... Take half  tablet by mouth once a day    Lisinopril 20 Mg Tabs (Lisinopril) .[name]... Take one tablet by mouth daily  Patient Instructions: 1)  Your physician recommends that you schedule a follow-up appointment in: 1 year with Dr. Daleen Squibb 2)  Your physician recommends that you continue on your current medications as directed. Please refer to the Current Medication list given to you today.

## 2010-03-03 ENCOUNTER — Ambulatory Visit (INDEPENDENT_AMBULATORY_CARE_PROVIDER_SITE_OTHER): Payer: Medicare Other | Admitting: Internal Medicine

## 2010-03-03 ENCOUNTER — Encounter: Payer: Self-pay | Admitting: Internal Medicine

## 2010-03-03 ENCOUNTER — Other Ambulatory Visit: Payer: Self-pay | Admitting: Internal Medicine

## 2010-03-03 DIAGNOSIS — E785 Hyperlipidemia, unspecified: Secondary | ICD-10-CM

## 2010-03-03 DIAGNOSIS — E119 Type 2 diabetes mellitus without complications: Secondary | ICD-10-CM

## 2010-03-03 DIAGNOSIS — I10 Essential (primary) hypertension: Secondary | ICD-10-CM

## 2010-03-03 DIAGNOSIS — IMO0001 Reserved for inherently not codable concepts without codable children: Secondary | ICD-10-CM

## 2010-03-03 LAB — CBC WITH DIFFERENTIAL/PLATELET
Basophils Relative: 0.5 % (ref 0.0–3.0)
Eosinophils Relative: 2 % (ref 0.0–5.0)
HCT: 40.3 % (ref 36.0–46.0)
Lymphs Abs: 1.6 10*3/uL (ref 0.7–4.0)
MCV: 86.4 fl (ref 78.0–100.0)
Monocytes Absolute: 0.3 10*3/uL (ref 0.1–1.0)
Neutrophils Relative %: 50.4 % (ref 43.0–77.0)
RBC: 4.66 Mil/uL (ref 3.87–5.11)
WBC: 3.9 10*3/uL — ABNORMAL LOW (ref 4.5–10.5)

## 2010-03-03 LAB — HM DIABETES FOOT EXAM

## 2010-03-03 LAB — MICROALBUMIN / CREATININE URINE RATIO: Microalb, Ur: 0.6 mg/dL (ref 0.0–1.9)

## 2010-03-03 LAB — LIPID PANEL
HDL: 71.9 mg/dL (ref >39.00)
Triglycerides: 59 mg/dL (ref 0.0–149.0)

## 2010-03-03 LAB — LDL CHOLESTEROL, DIRECT: Direct LDL: 123.5 mg/dL

## 2010-03-03 NOTE — Letter (Signed)
Summary: CMN for CPAP/[facility]  CMN for CPAP/[facility]   Imported By: Lanelle Bal 07/07/2009 11:58:26  _____________________________________________________________________  External Attachment:    Type:   Image     Comment:   External Document

## 2010-03-08 ENCOUNTER — Encounter: Payer: Self-pay | Admitting: Internal Medicine

## 2010-03-08 NOTE — Progress Notes (Signed)
Summary: new diabetic meter, lancets and test strips  *appt friday  Phone Note Refill Request Message from:  Patient on March 01, 2010 9:47 AM  Refills Requested: Medication #1:  new diabetic meter  Medication #2:  lancets  Medication #3:  test strips [facility] diabetic supply---see paperwork about spouse Adela Glimpse [facility]) for address  Initial call taken by: Jerolyn Shin,  March 01, 2010 9:48 AM  Follow-up for Phone Call        Dr.Paz has paperwork for all of this. Army Fossa CMA  March 01, 2010 9:49 AM   Additional Follow-up for Phone Call Additional follow up Details #1::        ok to Rx above if needed . ambulatory CBGs checked 2 times a day Jose E. Paz MD  March 01, 2010 11:27 AM     Additional Follow-up for Phone Call Additional follow up Details #2::    Note:  We can do everyting when the patient comes in on Friday. Lucious Groves CMA  March 01, 2010 3:18 PM

## 2010-03-16 NOTE — Letter (Signed)
Summary: Diabetic [facility] Medical Supplies  Diabetic & Medical Supplies   Imported By: Kassie Mends 03/09/2010 10:34:51  _____________________________________________________________________  External Attachment:    Type:   Image     Comment:   External Document

## 2010-03-16 NOTE — Assessment & Plan Note (Signed)
Summary: Discuss excessive growth of hair/kb   Vital Signs:  Patient profile:   70 year old female Weight:      180.13 pounds Pulse rate:   70 / minute Pulse rhythm:   regular BP sitting:   136 / 84  (left arm) Cuff size:   large  Vitals Entered By: Army Fossa CMA (March 03, 2010 10:18 AM) CC: Pt here to discuss excessive hair growth on face.  Comments [facility]    History of Present Illness: Acute visit  ( we also evaluated her  chronic problems)  c/o  excessive facial hair growth  c/o generalized (from head to toes) pain, described as " nerves prickling pain" Patient wonders if related to shingles that she had in the back 11/2009 Also recognizes that she ran out of Lyrica a month ago  Diabetes -- ambulatory CBGs in the 99 to 110 range  Hyperlipidemia-- on no medicines, due for labs Hypertension-- lisinopril added by cards d/t elevated BP,  good medication compliance    ROS admits to subjective fever, no rash No chest pain or shortness of breath. No edema No cough, no wheezing No joint aches or puffiness except for bilateral knee pain   Current Medications (verified): 1)  Aspirin 81 Mg Tbec (Aspirin) .... Take One Tablet By Mouth Daily 2)  Bystolic 20 Mg Tabs (Nebivolol Hcl) .... Take 1 Tablet Every Morning 3)  Hydrochlorothiazide 25 Mg Tabs (Hydrochlorothiazide) .... Take Half  Tablet By Mouth Once A Day 4)  Metformin Hcl 500 Mg Xr24h-Tab (Metformin Hcl) .... One By Mouth Daily 5)  Etodolac Cr 400 Mg  Tb24 (Etodolac) .... One By Mouth Daily 6)  Potassium 99 Mg Tabs (Potassium) .[name].. 1 Tab Once Daily 7)  Multivitamins   Tabs (Multiple Vitamin) .[name].. 1 Tab Once Daily 8)  Glucosamine Chondr 1500 Complx   Caps (Glucosamine-Chondroit-Vit C-Mn) .[name].. 1 Tab Once Daily 9)  Prilosec Otc 20 Mg Tbec (Omeprazole Magnesium) .... Take One 30-60 Min Before First Meal of The Day 10)  Ventolin Hfa 108 (90 Base) Mcg/act Aers (Albuterol Sulfate) .... 2 Puffs Every 4 Hours As  Needed Wheezing and Coughing 11)  Norco 5-325 Mg Tabs (Hydrocodone-Acetaminophen) .... As Needed 12)  Lisinopril 20 Mg Tabs (Lisinopril) .... Take One Tablet By Mouth Daily 13)  Pepcid 20 Mg Tabs (Famotidine) .... As Needed in [place]  Allergies (verified): 1)  ! * Tramadol 2)  * Seldane 3)  Ceftin (Cefuroxime Axetil) 4)  * Hycotuss Expectorant (Hydrocodone-Guaifenesin)  Past History:  Past Medical History: Reviewed history from 01/02/2010 and no changes required. 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 and CT chest. Had a 9 beat NSVT Fibromyalgia  abnormal CT of the chest in 2008, follow-up CT , April 2009: Unchanged.  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 partial thyroidectomy in the 60s Cervical laminectomy, in [place] , 2001  Hysterectomy Oophorectomy foot surgery neg cardiolite 10/05 barium swallow neg 2/04 EGD neg 9/06  Social History: Reviewed history from 07/06/2009 and no changes required. Married on disability since 2000 pt has children x 2  Patient states former smoker ,  quit in the late 90s.  ETOH--no  Physical Exam  General:  alert and well-developed.   Lungs:  normal respiratory effort, no intercostal retractions, no accessory muscle use, and normal breath sounds.   Heart:  normal rate, regular rhythm, and no murmur.   Pulses:  normal bilateral pedal pulses Extremities:  no edema Skin:  she has what seems to be a normal amount of facial hair Psych:  not anxious appearing and not depressed appearing.    Diabetes Management Exam:     Foot Exam (with socks and/or shoes not present):       Sensory-Pinprick/Light touch:          Left medial foot (L-4): normal          Left dorsal foot (L-5): normal          Left lateral foot (S-1): normal          Right medial foot (L-4): normal          Right dorsal foot (L-5): normal          Right lateral foot (S-1): normal       Sensory-Monofilament:          Left foot: normal          Right foot: normal       Inspection:          Left foot: normal          Right foot: normal       Nails:          Left foot: normal          Right foot: normal   Impression & Recommendations:  Problem # 1:  facial hair? observation  Problem # 2:  HYPERTENSION (ICD-401.9) well controlled at present Her updated medication list for this problem includes:    Bystolic 20 Mg Tabs (Nebivolol hcl) .[name]... Take 1 tablet every morning    Hydrochlorothiazide 25 Mg Tabs (Hydrochlorothiazide) .[name]... Take half  tablet by mouth once a day    Lisinopril 20 Mg Tabs (Lisinopril) .[name]... Take one tablet by mouth daily  BP today: 136/84 Prior BP: 120/78 (01/25/2010)  Labs Reviewed: K+: 4.4 (01/05/2010) Creat: : 0.8 (01/05/2010)   Chol: 133 (10/29/2008)   HDL: 64.20 (10/29/2008)   LDL: 58 (10/29/2008)   TG: 54.0 (10/29/2008)  Problem # 3:  HYPERLIPIDEMIA (ICD-272.4) labs Labs Reviewed: SGOT: 25 (06/15/2009)   SGPT: 22 (06/15/2009)   HDL:64.20 (10/29/2008), 62.0 (09/25/2007)  LDL:58 (10/29/2008), 104 (08/65/7846)  Chol:133 (10/29/2008), 182 (09/25/2007)  Trig:54.0 (10/29/2008), 78 (09/25/2007)  Orders: Venipuncture (96295) TLB-Lipid Panel (80061-LIPID) Specimen Handling (28413)  Problem # 4:  DIABETES MELLITUS, TYPE II (ICD-250.00)  due for labs we usually see her in the context of acute visits, encourage her to have routine office visit Her updated medication list for this problem includes:    Aspirin 81 Mg Tbec (Aspirin) .[name]... Take one tablet by mouth daily    Lisinopril 20 Mg Tabs (Lisinopril) .[name]...  Take one tablet by mouth daily    Metformin Hcl 500 Mg Xr24h-tab (Metformin hcl) ..... One by mouth daily    Labs Reviewed: Creat: 0.8 (01/05/2010)    Reviewed HgBA1c results: 6.5 (10/05/2009)  6.4 (06/15/2009)  Orders: TLB-A1C / Hgb A1C (Glycohemoglobin) (83036-A1C) TLB-Microalbumin/Creat Ratio, Urine (82043-MALB) TLB-ALT (SGPT) (84460-ALT) TLB-AST (SGOT) (84450-SGOT) Specimen Handling (24401)  Problem # 5:  FIBROMYALGIA (ICD-729.1) see history of present illness, has generalized (" absolutely everywhere") prickly type pain.  the patient wonders if  it is related to shingles, I doubt it. symptoms may be rather related to fibromyalgia. Refilled lyrica, labs  Her updated medication list for this problem includes:    Aspirin 81 Mg Tbec (Aspirin) .[name]... Take one tablet by mouth daily    Etodolac Cr 400 Mg Tb24 (Etodolac) ..... One by mouth daily    Norco 5-325 Mg Tabs (Hydrocodone-acetaminophen) .[name]... As needed  Orders: TLB-CBC Platelet - w/Differential (85025-CBCD) TLB-Sedimentation Rate (ESR) (85652-ESR) Specimen Handling (11914)  Complete Medication List: 1)  Aspirin 81 Mg Tbec (Aspirin) .... Take one tablet by mouth daily 2)  Bystolic 20 Mg Tabs (Nebivolol hcl) .... Take 1 tablet every morning 3)  Hydrochlorothiazide 25 Mg Tabs (Hydrochlorothiazide) .... Take half  tablet by mouth once a day 4)  Lisinopril 20 Mg Tabs (Lisinopril) .... Take one tablet by mouth daily 5)  Lyrica 100 Mg Caps (Pregabalin) .[name].. 1 by mouth at bedtime as needed 6)  Metformin Hcl 500 Mg Xr24h-tab (Metformin hcl) .... One by mouth daily 7)  Etodolac Cr 400 Mg Tb24 (Etodolac) .... One by mouth daily 8)  Potassium 99 Mg Tabs (Potassium) .[name].. 1 tab once daily 9)  Multivitamins Tabs (Multiple vitamin) .[name].. 1 tab once daily 10)  Glucosamine Chondr 1500 Complx Caps (Glucosamine-chondroit-vit c-mn) .[name].. 1 tab once daily 11)  Prilosec Otc 20 Mg Tbec (Omeprazole magnesium) .... Take one 30-60 min before first  meal of the day 12)  Ventolin Hfa 108 (90 Base) Mcg/act Aers (Albuterol sulfate) .... 2 puffs every 4 hours as needed wheezing and coughing 13)  Norco 5-325 Mg Tabs (Hydrocodone-acetaminophen) .... As needed 14)  Pepcid 20 Mg Tabs (Famotidine) .... As needed in the eveing  Patient Instructions: 1)  Please schedule a follow-up appointment in 3 months .  Prescriptions: LYRICA 100 MG  CAPS (PREGABALIN) 1 by mouth at bedtime as needed  #30 x 11   Entered and Authorized by:   Jose E. Paz MD   Signed by:   Nolon Rod. Paz MD on 03/03/2010   Method used:   Print then Give to Patient   RxID:   [id number]    Orders Added: 1)  Venipuncture [69629] 2)  TLB-Lipid Panel [80061-LIPID] 3)  TLB-A1C / Hgb A1C (Glycohemoglobin) [83036-A1C] 4)  TLB-Microalbumin/Creat Ratio, Urine [82043-MALB] 5)  TLB-ALT (SGPT) [84460-ALT] 6)  TLB-AST (SGOT) [84450-SGOT] 7)  TLB-CBC Platelet - w/Differential [85025-CBCD] 8)  TLB-Sedimentation Rate (ESR) [85652-ESR] 9)  Specimen Handling [99000] 10)  Est. Patient Level IV [52841]

## 2010-03-17 ENCOUNTER — Telehealth: Payer: Self-pay | Admitting: Internal Medicine

## 2010-03-22 NOTE — Medication Information (Signed)
Summary: Order for Diabetic Supplies  Order for Diabetic Supplies   Imported By: Maryln Gottron 03/13/2010 14:13:14  _____________________________________________________________________  External Attachment:    Type:   Image     Comment:   External Document

## 2010-03-22 NOTE — Progress Notes (Signed)
Summary: FYI--Pt does not use [employer]  Phone Note Call from Patient   Summary of Call: Patient called with FYI that she does not wish to use [facility]. She would like to continue using [employer]. Patient was mistaken in thinking that this was the same company. Initial call taken by: Lucious Groves CMA,  March 17, 2010 5:06 PM

## 2010-04-08 ENCOUNTER — Encounter: Payer: Self-pay | Admitting: Internal Medicine

## 2010-04-13 LAB — POCT RAPID STREP A (OFFICE): Streptococcus, Group A Screen (Direct): NEGATIVE

## 2010-04-17 LAB — URINALYSIS, ROUTINE W REFLEX MICROSCOPIC
Glucose, UA: NEGATIVE mg/dL
Ketones, ur: NEGATIVE mg/dL
Nitrite: POSITIVE — AB
Protein, ur: NEGATIVE mg/dL
pH: 6.5 (ref 5.0–8.0)

## 2010-04-17 LAB — URINE CULTURE: Colony Count: >=100000

## 2010-04-17 LAB — URINE MICROSCOPIC-ADD ON

## 2010-04-17 LAB — POCT I-STAT, CHEM 8
BUN: 20 mg/dL (ref 6–23)
Calcium, Ion: 1.1 mmol/L — ABNORMAL LOW (ref 1.12–1.32)
Chloride: 108 meq/L (ref 96–112)
Creatinine, Ser: 0.9 mg/dL (ref 0.4–1.2)
Glucose, Bld: 93 mg/dL (ref 70–99)
HCT: 41 % (ref 36.0–46.0)
Hemoglobin: 13.9 g/dL (ref 12.0–15.0)
Potassium: 3.8 meq/L (ref 3.5–5.1)
Sodium: 140 meq/L (ref 135–145)
TCO2: 24 mmol/L (ref 0–100)

## 2010-04-17 LAB — POCT CARDIAC MARKERS
CKMB, poc: 5.5 ng/mL (ref 1.0–8.0)
Myoglobin, poc: 179 ng/mL (ref 12–200)

## 2010-04-17 LAB — CBC
Platelets: 137 10*3/uL — ABNORMAL LOW (ref 150–400)
RBC: 4.59 MIL/uL (ref 3.87–5.11)
WBC: 4.3 10*3/uL (ref 4.0–10.5)

## 2010-04-17 LAB — CK: Total CK: 489 U/L — ABNORMAL HIGH (ref 7–177)

## 2010-04-17 LAB — HEMOCCULT GUIAC POC 1CARD (OFFICE): Fecal Occult Bld: NEGATIVE

## 2010-04-28 ENCOUNTER — Telehealth: Payer: Self-pay | Admitting: *Deleted

## 2010-04-28 NOTE — Telephone Encounter (Signed)
Called pt back to get more information left msg on machine to return.

## 2010-04-29 ENCOUNTER — Encounter: Payer: Self-pay | Admitting: Internal Medicine

## 2010-05-01 ENCOUNTER — Ambulatory Visit (INDEPENDENT_AMBULATORY_CARE_PROVIDER_SITE_OTHER)
Admission: RE | Admit: 2010-05-01 | Discharge: 2010-05-01 | Disposition: A | Discharge status: Home / Self Care | Payer: Medicare Other | Source: Ambulatory Visit | Attending: Internal Medicine | Admitting: Internal Medicine

## 2010-05-01 ENCOUNTER — Ambulatory Visit (INDEPENDENT_AMBULATORY_CARE_PROVIDER_SITE_OTHER): Payer: Medicare Other | Admitting: Internal Medicine

## 2010-05-01 ENCOUNTER — Encounter: Payer: Self-pay | Admitting: Internal Medicine

## 2010-05-01 VITALS — BP 126/80 | HR 82 | Temp 98.9°F | Wt 181.0 lb

## 2010-05-01 DIAGNOSIS — J189 Pneumonia, unspecified organism: Secondary | ICD-10-CM

## 2010-05-01 LAB — CBC WITH DIFFERENTIAL/PLATELET
Basophils Absolute: 0 10*3/uL (ref 0.0–0.1)
Eosinophils Absolute: 0.1 10*3/uL (ref 0.0–0.7)
HCT: 38.3 % (ref 36.0–46.0)
Hemoglobin: 13.1 g/dL (ref 12.0–15.0)
Lymphs Abs: 1.8 10*3/uL (ref 0.7–4.0)
MCHC: 34.1 g/dL (ref 30.0–36.0)
Monocytes Absolute: 0.6 10*3/uL (ref 0.1–1.0)
Neutro Abs: 4.5 10*3/uL (ref 1.4–7.7)
Platelets: 155 10*3/uL (ref 150.0–400.0)
RDW: 14 % (ref 11.5–14.6)

## 2010-05-01 LAB — BASIC METABOLIC PANEL
CO2: 29 mEq/L (ref 19–32)
Calcium: 9.1 mg/dL (ref 8.4–10.5)
Creatinine, Ser: 0.8 mg/dL (ref 0.4–1.2)
GFR: 95.88 mL/min (ref >60.00)
Sodium: 135 mEq/L (ref 135–145)

## 2010-05-01 MED ORDER — ALBUTEROL SULFATE HFA 108 (90 BASE) MCG/ACT IN AERS: 2.0000 | INHALATION_SPRAY | Freq: Four times a day (QID) | RESPIRATORY_TRACT | Status: DC | PRN

## 2010-05-01 MED ORDER — MOXIFLOXACIN HCL 400 MG PO TABS: 400.0000 mg | ORAL_TABLET | Freq: Every day | ORAL | Status: AC

## 2010-05-01 NOTE — Assessment & Plan Note (Addendum)
Suspect pneumonia on clinical grounds Plan: Labs, XR, ER if sx increase See instructions abx to be call w/ results, likely will need avelox --------------------------- Addendum, labs and x-rays negative. Will prescribe   Avelox as planned

## 2010-05-01 NOTE — Telephone Encounter (Signed)
Pt has pending appt today w/ Dr. Drue Novel

## 2010-05-01 NOTE — Progress Notes (Signed)
  Subjective:    Patient ID: Samantha [name], female    DOB: November 04, 1940, 70 y.o.   MRN: [id number]  HPI  Symptoms started a week ago, cough, hoarseness. She is also producing a small amount of green sputum. mucinex helping little, had  some low-grade fever  Past Medical History  Diagnosis Date  . OSA (obstructive sleep apnea) 09/2007    dx w/ a sleep study, Rx CPAP, weight loss  . COPD (chronic obstructive pulmonary disease) 01/29/2002    Wert, disproved 07/06/09 w/ nl pft's  . Hyperlipidemia   . Hypertension   . Chest pain, atypical 11/2006    saw cards: neg/normal Holter, ECHo and myoview, admitted w/ CP 4/09 neg enz.and CT chest had a 9 beat NSVT  . Fibromyalgia   . Abnormal CT of the chest 2008    follow up CT, April 2009: unchanged. No f/u suggested   . 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 Shingles 11/2009   Past Surgical History  Procedure Date  . Thyroidectomy, partial     in the 60s  . Cervical laminectomy 2001    in [place]  . Abdominal hysterectomy   . Oophorectomy   . Foot surgery     Review of Systems  Mild nausea, no vomiting Denies myalgias, she does have generalized aches in the joints. Anterior chest pain with cough only, mild shortness of breath and chest congestion. The shortness of breath started 4 days ago. Overall today she feels is slightly better than yesterday. No sinus pain or discharge    Objective:   Physical Exam  Constitutional: She appears well-developed and well-nourished.       Nontoxic  HENT:  Head: Normocephalic and atraumatic.  Right Ear: External ear normal.  Left Ear: External ear normal.       Not tender to palpation at the maxillary sinuses  Eyes: EOM are normal. Pupils are equal, round, and reactive to light.  Neck: Normal range of motion. Neck supple.  Cardiovascular: Normal rate, regular rhythm and normal heart sounds.   Pulmonary/Chest:       No increased work of breathing, mild wheezing throughout, a  few crackles at the left base. Rhonchi bilaterally.          Assessment & Plan:

## 2010-05-01 NOTE — Patient Instructions (Signed)
Rest, fluids, tylenol mucinex DM Get the XRs , labs Call if severe symptoms, increase short of breath, high fever or not getting better in 3 days

## 2010-05-31 ENCOUNTER — Ambulatory Visit: Payer: Medicare Other | Admitting: Internal Medicine

## 2010-06-06 ENCOUNTER — Telehealth: Payer: Self-pay | Admitting: Internal Medicine

## 2010-06-06 ENCOUNTER — Ambulatory Visit (INDEPENDENT_AMBULATORY_CARE_PROVIDER_SITE_OTHER): Payer: Medicare Other | Admitting: Internal Medicine

## 2010-06-06 ENCOUNTER — Encounter: Payer: Self-pay | Admitting: Internal Medicine

## 2010-06-06 DIAGNOSIS — E119 Type 2 diabetes mellitus without complications: Secondary | ICD-10-CM

## 2010-06-06 DIAGNOSIS — N390 Urinary tract infection, site not specified: Secondary | ICD-10-CM

## 2010-06-06 DIAGNOSIS — I8390 Asymptomatic varicose veins of unspecified lower extremity: Secondary | ICD-10-CM

## 2010-06-06 DIAGNOSIS — I10 Essential (primary) hypertension: Secondary | ICD-10-CM

## 2010-06-06 DIAGNOSIS — R5381 Other malaise: Secondary | ICD-10-CM

## 2010-06-06 DIAGNOSIS — R5383 Other fatigue: Secondary | ICD-10-CM

## 2010-06-06 DIAGNOSIS — Z0000 Encounter for general adult medical examination without abnormal findings: Secondary | ICD-10-CM

## 2010-06-06 DIAGNOSIS — I8393 Asymptomatic varicose veins of bilateral lower extremities: Secondary | ICD-10-CM | POA: Insufficient documentation

## 2010-06-06 LAB — LIPID PANEL
Cholesterol: 196 mg/dL (ref 0–200)
LDL Cholesterol: 110 mg/dL — ABNORMAL HIGH (ref 0–99)
Triglycerides: 77 mg/dL (ref 0.0–149.0)

## 2010-06-06 LAB — HEMOGLOBIN A1C: Hgb A1c MFr Bld: 6.4 % (ref 4.6–6.5)

## 2010-06-06 NOTE — Telephone Encounter (Signed)
Pt is checking out and wanted to make sure her Platlet Count is being checked with her labs today. Spoke with Duwayne Heck and she said she will call the patient.

## 2010-06-06 NOTE — Assessment & Plan Note (Addendum)
History of fatigue, check vitamin D.  Addendum, unable to check vitamin D due to "code" not covered

## 2010-06-06 NOTE — Progress Notes (Signed)
Subjective:    Patient ID: Samantha [name], female    DOB: [birth year], 70 y.o.   MRN: [id number]  HPI  Here for Medicare AWV:  1. Risk factors based on Past M, S, F history: reviewed 2. Physical Activities: does exercise (stretching mostly) , walks some  3. Depression/mood: occasionally gets blue, takes care of husband w/ MS but doing "ok" 4. Hearing:  No problems noted or reported  5. ADL's:  Independent  6. Fall Risk: low risk 7. home Safety: does feelsafe at home  8. Height, weight, &visual acuity: see VS, uses glasses, has cataract, to see ophthalmology soon 9. Counseling: provided 10. Labs ordered based on risk factors: if needed  11. Referral Coordination: if needed 12.  Care Plan, see assessment and plan  13.   Cognitive Assessment: Motor skills and cognition within normal limits  In addition, today we discussed the following:  DM-- amb CBGs < 120, usually ~ 100, good med compliance HTN-- amb BPs   ~130s/80-85 C/o varicoce veins w/ edema on-off, occ burning feeling and heavy feeling  Past Medical History  Diagnosis Date  . OSA (obstructive sleep apnea) 09/2007    dx w/ a sleep study, Rx CPAP, weight loss  . COPD (chronic obstructive pulmonary disease) 01/29/2002    Wert, disproved 07/06/09 w/ nl pft's  . Hyperlipidemia   . Hypertension   . Chest pain, atypical 11/2006    saw cards: neg/normal Holter, ECHo and myoview, admitted w/ CP 4/09 neg enz.and CT chest had a 9 beat NSVT  . Fibromyalgia   . Abnormal CT of the chest 2008    follow up CT, April 2009: unchanged. No f/u suggested   . 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 Shingles 11/2009  . Diabetes mellitus    Past Surgical History  Procedure Date  . Thyroidectomy, partial     in the 60s  . Cervical laminectomy 2001    in [place]  . Abdominal hysterectomy   . Foot surgery    Family History  Problem Relation Age of Onset  . Allergies Sister   . Asthma Sister   . Asthma Paternal  Grandmother   . Heart disease Father   . Heart disease Mother   . Heart disease      paternal grandparents, maternal grandparents,   . Heart disease Brother   . Lung cancer Mother   . Breast cancer Neg Hx   . Colon cancer Neg Hx    History   Social History  . Marital Status: Married    Spouse Name: N/A    Number of Children: 2  . Years of Education: N/A   Occupational History  . Not on file.   Social History Main Topics  . Smoking status: Former Games developer  . Smokeless tobacco: Not on file  . Alcohol Use: No  . Drug Use: Not on file  . Sexually Active: Not on file   Other Topics Concern  . Not on file   Social History Narrative   On disability since 2000--- also husband has MS     Review of Systems  Respiratory: Negative for cough and wheezing.   Cardiovascular: Negative for chest pain.          Genitourinary: Negative for dysuria and hematuria.       Objective:   Physical Exam  Constitutional: She is oriented to person, place, and time. She appears well-developed and well-nourished. No distress.  HENT:  Head:  Normocephalic and atraumatic.  Neck: No thyromegaly present.       Normal carotid pulses bilaterally  Cardiovascular: Normal rate, regular rhythm and normal heart sounds.   No murmur heard. Pulmonary/Chest: Effort normal and breath sounds normal. No respiratory distress. She has no wheezes. She has no rales.  Abdominal: Soft. Bowel sounds are normal. She exhibits no distension. There is no tenderness. There is no rebound and no guarding.  Musculoskeletal: She exhibits no edema.       + LE varicose veins w/o phlebitis or ulcers  Neurological: She is alert and oriented to person, place, and time.  Skin: Skin is warm and dry. She is not diaphoretic.  Psychiatric: She has a normal mood and affect. Her behavior is normal. Judgment and thought content normal.          Assessment & Plan:

## 2010-06-06 NOTE — Assessment & Plan Note (Addendum)
TD 2003 Pneumonia shot 2004 Shingles shot? Had shingles    MMG 07-2009 neg female care per gyn  No documentation of previous bone density test but per pt, gyn does her DEXAS (normal)  Diet and exercise discussed  Colonoscopy:  Done (09/29/2004) , GI recommended an office visit to assess the needs of a repeat colonoscopy. Recommend patient to call them.

## 2010-06-06 NOTE — Telephone Encounter (Signed)
They were checked last month and were okay. I don't see a urgency to recheck but if she insists, we can do that

## 2010-06-06 NOTE — Assessment & Plan Note (Signed)
History of varicose veins, no ulcers, occasional achiness. Recommend stockings. See instructions

## 2010-06-06 NOTE — Assessment & Plan Note (Signed)
No change 

## 2010-06-06 NOTE — Patient Instructions (Signed)
Use over-the-counter compression stockings to prevent varicose veins to get worse or cause pain. Please call GI and schedule an appointment. I have printed for you the 2011 letter from Dr. Christella Hartigan

## 2010-06-06 NOTE — Assessment & Plan Note (Signed)
  Due for labs. Good medication compliance.

## 2010-06-06 NOTE — Telephone Encounter (Signed)
Do pts platelets need to be checked?

## 2010-06-07 NOTE — Telephone Encounter (Signed)
Message left for patient to return my call.  

## 2010-06-07 NOTE — Telephone Encounter (Signed)
I spoke w/ pt she is aware that they were checked last month. She is okay with not rechecking right now.

## 2010-06-09 ENCOUNTER — Telehealth: Payer: Self-pay | Admitting: *Deleted

## 2010-06-09 NOTE — Telephone Encounter (Signed)
I spoke w/ pt she is aware.  

## 2010-06-09 NOTE — Telephone Encounter (Signed)
Message left for patient to return my call.  

## 2010-06-09 NOTE — Telephone Encounter (Signed)
Message copied by Army Fossa on Fri Jun 09, 2010  1:26 PM ------      Message from: Samantha [name]      Created: Fri Jun 09, 2010 12:58 PM       Advise patient:      Diabetes is well controlled.      Cholesterol is better than a few months ago, the LDL is 110, closer to the goal of 100. Instead of medications, I recommend to try to eat healthier and exercise more.

## 2010-06-13 NOTE — Discharge Summary (Signed)
NAMENORETTA, FRIER NO.:  [id number]   MEDICAL RECORD NO.:  [id number]          PATIENT TYPE:  OBV   LOCATION:  4705                         FACILITY:  [facility]   PHYSICIAN:  Stacie Glaze, MD    DATE OF BIRTH:  1940/12/17   DATE OF ADMISSION:  05/07/2007  DATE OF DISCHARGE:  05/09/2007                               DISCHARGE SUMMARY   ADMITTING DIAGNOSIS:  Chest pain, rule out myocardial infarction.   DISCHARGE DIAGNOSES:  1. Atypical chest pain.  2. Adult-onset diabetes.  3. Nonsustained ventricular tachycardia.  4. Hypertension.  5. Hyperlipidemia.  6. Chronic obstructive pulmonary disease.   HOSPITAL COURSE:  The patient is an obese [place] female who  presented for admission for a chest pain that radiated to her upper  back, between her shoulder blade.  She  stated that this was a pain  pressure sensation radiated into her left arm, but denied nausea or  diaphoresis.  Her risk factors were hypertension, hyperlipidemia, COPD,  and diabetes.  She had been previously evaluated by Cardiology and had  been followed for a previous nodule on her lungs in October 2008.  She  was admitted to the Internal Medicine Service although the chest pain  appeared pleuritic.  She had an extensive cardiovascular workup in the  past.  She was ruled out with serial EKGs and enzymes and was placed on  telemetry.  During her telemetry, she had a nonsustained round of  supraventricular tachycardia that was 9 beats in length and has had no  recurrent dysrhythmia during her hospitalization that dysrhythmia was  asymptomatic.   Her diabetes has been adequately controlled.  Her COPD has been stable.  She was referred for a CT angio to rule out PE as the cause of her chest  pain because of her risk factors of morbid obesity and because of her  slightly elevated D-dimer.  A subsequent CT angiogram revealed no  pulmonary source for the pain.  The nodule that had been seen  on prior  CT in 2008 appeared stable.   IMPRESSION:  1. Noncardiac chest pain.  2. Diabetes.  3. Hypertension.  4. Hyperlipidemia.  5. Obesity.   PLAN:  Discharge to home on her premorbid medications with followup with  Dr. Drue Novel.  A consideration has been given for mild viral pleurisy, and  she will be continued on antiinflammatory as a new medication in  addition to her home medications until she sees Dr. Drue Novel.      Stacie Glaze, MD  Electronically Signed     JEJ/MEDQ  D:  05/09/2007  T:  05/10/2007  Job:  [id number]

## 2010-06-13 NOTE — Assessment & Plan Note (Signed)
[facility]                            CARDIOLOGY OFFICE NOTE   MODUPE, SHAMPINE                   MRN:          [id number]  DATE:12/23/2006                            DOB:          December 11, 1940    PRIMARY CARE PHYSICIAN:  Willow Ora, M.D.   REASON FOR VISIT:  Followup cardiac testing.   HISTORY OF PRESENT ILLNESS:  I saw Samantha [name] back in early November.  She was referred at that time with a constellation of symptoms including  atypical chest discomfort with some worsening on exertion, dyspnea on  exertion, intermittent fatigue, and feeling of occasional pounding rapid  heart beats without dizziness or syncope.  She had also experienced  general malaise and some clamminess.  I referred her for followup  testing including a Holter monitor.  This study demonstrated sinus  rhythm ranging from 54-115 beats per minute with rare premature  ventricular complexes and no sustained events or pauses.  She underwent  an adenosine Myoview which reassuringly was normal without any evidence  of ischemia and with an ejection fraction of 74%.  In addition, she had  an echocardiogram demonstrating an ejection fraction of 65% with mild  focal basal septal hypertrophy and no significant valvular  abnormalities.  In light of these findings, Ms. Seidl was reassured.  She states that her symptoms have improved somewhat but have not  resolved.  She still complains of a fairly atypical focal pain on the  right side of her chest.  She has had some dyspnea on exertion and still  occasional palpitations but no syncope.  We discussed her beta-blocker and the possibility that it could be  further titrated, although she is fairly well controlled as far as heart  rate is concerned, in the 60s today.  I thought the best approach would  be observation at this point and consideration of other etiologies.   ALLERGIES:  1. CEFTIN.  2. SELDANE.   PRESENT MEDICATIONS:  1.  Glipizide ER 5 mg p.o. daily.  2. Hydrochlorothiazide 25 mg p.o. daily.  3. Norvasc 10 mg p.o. daily.  4. Toprol XL 50 mg p.o. daily.  5. Simvastatin 40 mg p.o. daily.  6. Albuterol inhaler 2 puffs q.i.d.  7. Multivitamin daily.  8. Potassium daily.  9. Aspirin 162 mg p.o. daily.   REVIEW OF SYSTEMS:  As described in the history of present illness.   PHYSICAL EXAMINATION:  VITAL SIGNS:  Blood pressure 161/83, heart rate  60, weight 191 pounds.  GENERAL:  The patient is comfortable and in no acute distress.  NECK:  Reveals no elevated jugular venous pressure .  No loud bruits.  LUNGS:  Clear.  Diminished breath sounds.  No wheezing.  CARDIAC:  Reveals a regular rate and rhythm.  No S3 gallop or  pericardial rub.  EXTREMITIES:  Show no pitting edema.   IMPRESSION/RECOMMENDATIONS:  1. Symptom complex as discussed above associated with reassuring      cardiac evaluation including a normal adenosine Myoview, normal      ejection fraction without significant valvular abnormalities by      echocardiogram, and  only findings of rare premature ventricular      complexes on a Holter monitor.  She may well be feeling her      premature ventricular complexes but I do not think this is an      explanation for the entirety of her symptoms.  I have asked her to      follow up with Dr. Drue Novel to consider other etiologies and perhaps      even consider a repeat pulmonary assessment.  She has seen Dr. Sherene Sires      in the past.  2. Cardiology followup will be as needed at this point.     Jonelle Sidle, MD  Electronically Signed    SGM/MedQ  DD: 12/23/2006  DT: 12/23/2006  Job #: [id number]   cc:   Willow Ora, MD

## 2010-06-13 NOTE — Procedures (Signed)
NAME:  Samantha [name], Samantha [name] NO.:  [id number]   MEDICAL RECORD NO.:  [id number]          PATIENT TYPE:  OUT   LOCATION:  SLEEP CENTER                 FACILITY:  [facility]   PHYSICIAN:  Barbaraann Share, MD,FCCPDATE OF BIRTH:  04-26-42   DATE OF STUDY:                            NOCTURNAL POLYSOMNOGRAM   REFERRING PHYSICIAN:  Barbaraann Share, MD,FCCP   REFERRING PHYSICIAN:  Barbaraann Share, MD, FCCP   INDICATION FOR STUDY:  Hypersomnia with sleep apnea.   EPWORTH SLEEPINESS SCORE:  11.   MEDICATIONS:   SLEEP ARCHITECTURE:  The patient had a total sleep time of 358 minutes  with adequate slow wave sleep for age, but decreased REM.  Sleep onset  latency was normal at 6 minutes, and REM onset was normal as well.  Sleep efficiency was excellent at 98%.   RESPIRATORY DATA:  The patient was found to have 37 apneas and 62  hypopneas for an apnea-hypopnea index of 17 events per hour.  The events  occurred primarily during REM and in the supine position, and there was  mild-to-moderate snoring noted throughout.   OXYGEN DATA:  There was O2 desaturation as low as 73% with the patient's  obstructive events.   CARDIAC DATA:  Occasional PVCs, but no clinically significant  arrhythmias were noted.   MOVEMENT-PARASOMNIA:  The patient had no significant leg jerks or  abnormal behaviors seen.   IMPRESSIONS-RECOMMENDATIONS:  1. Mild obstructive sleep apnea/hypopnea syndrome with an AHI of 17      events per hour, and oxygen desaturation is low at 73%.  Treatment      for this degree of sleep apnea can include weight loss alone if      applicable, upper      airway surgery, oral appliance, and also CPAP.  Clinical      correlation is suggested.  2. Occasional premature ventricular contractions with no significant      arrhythmia noted.      Barbaraann Share, MD,FCCP  Diplomate, American Board of Sleep  Medicine  Electronically Signed     [facility]/MEDQ  D:  12/02/2007  15:17:27  T:  12/03/2007 02:31:36  Job:  [id number]

## 2010-06-13 NOTE — Assessment & Plan Note (Signed)
[facility]                            CARDIOLOGY OFFICE NOTE   Shemika, Robbs NATIA FAHMY                   MRN:          [id number]  DATE:12/06/2006                            DOB:          May 20, 1940    REFERRING PHYSICIAN:  Willow Ora, MD   REASON FOR CONSULTATION:  Chest pain, clamminess, dyspnea on exertion,  and pounding heartbeat.   HISTORY OF PRESENT ILLNESS:  Ms. Samantha [name] is a 70 year old woman with a  history of hypertension, type 2 diabetes mellitus, hyperlipidemia, and  remote tobacco use.  She reports a history of double pneumonia back in  2004, requiring treatment at a hospital down in [place].  She states  that at that time, she was diagnosed with congestive heart failure.  Although I see that she had a followup cardiac evaluation by Dr. Daleen Squibb  here in 2005, at which time she had a Myoview demonstrating normal left  ventricular systolic function at 72% and no clear evidence of ischemia.   She describes a 1 month history of dyspnea on exertion and significant  fatigue with activities of daily living around her house.  She reports a  feeling of clamminess as well as chest pain described as a sharp pain  that is present most of the time to a mild degree, although seems to be  somewhat worse with exertion.  She has not had any syncope.  She had had  a feeling of pounding, rapid heartbeat associated with these symptoms as  well.  She has been evaluated by Dr. Drue Novel and also during an emergency  department visit back in October.  I see that she has had a CT scan of  her chest that showed no evidence of pulmonary embolus.  She had a small  3 mm density in the right lung, and Dr. Drue Novel plans a followup CT scan of  the chest over the next year.  Blood work from May showed an LDL  cholesterol of 85.  Today's electrocardiogram shows sinus rhythm with a  mildly prolonged PR interval of 212 msec.  She has had no followup  cardiac testing over the last 3  years.   ALLERGIES:  CEFTIN AND SELDANE.   PRESENT MEDICATIONS:  1. Glipizide ER 5 mg p.o. daily.  2. Hydrochlorothiazide 25 mg p.o. daily.  3. Norvasc 10 mg p.o. daily.  4. Toprol XL 50 mg p.o. daily.  5. Simvastatin 40 mg p.o. daily.  6. Albuterol inhalers 2 puffs q.i.d.  7. Multivitamin daily.  8. Potassium 99 mg p.o. daily.  9. Aspirin 81-162 mg p.o. daily.   REVIEW OF SYSTEMS:  As described in history of present illness.  She  also describes problems with arthritic pain, hiatal hernia, and asthma.   FAMILY HISTORY:  Reviewed.  The patient's mother died at age 6 with  cancer and heart failure.  The patient's father died at age 87 with  heart failure.   SOCIAL HISTORY:  The patient is married.  She has 2 children.  She is a  caregiver.  She states that her husband has multiple sclerosis.  She has  no  active tobacco use.  She states that she smoked cigarettes  approximately 5 years ago.  She denies any alcohol or recreational drug  use and drinks a half cup of caffeinated beverage a day.  She has been  trying to do some physical therapy exercises for 30 minutes at a time 2-  3 times a week.   PAST MEDICAL HISTORY:  1. As outlined above.  2. The patient removal of a thyroid tumor back in 1962.  3. Bunionectomy in 1978.  4. Hysterectomy, 1988.  5. 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  6. Cervical spine fusion in 2001.  7. Breast biopsy in 2001 (benign).   EXAMINATION:  Blood pressure is 127/87; heart rate is 82; weight is 189  pounds.  The patient is overweight in no acute distress.  HEENT:  Conjunctivae and lids are normal.  Oropharynx is clear.  NECK:  Supple.  No elevated jugular venous pressure, no loud bruits; no  thyromegaly is noted.  LUNGS:  Clear without labored breathing being at rest.  CARDIAC:  Regular rate and rhythm, soft systolic murmur at the base,  preserved S2, no S3 gallop or pericardial rub.  ABDOMEN:  Soft, nontender.   EXTREMITIES:  No significant pitting edema.  Distal pulses are 2+.  SKIN:  Warm and dry.  MUSCULOSKELETAL:  No kyphosis is noted.  NEUROPSYCHIATRIC:  The patient is alert and oriented x3.   IMPRESSION AND RECOMMENDATIONS:  1. Constellation of symptoms including atypical sharp chest pain with      some worsening on exertion, dyspnea on exertion, fatigue that has      been progressive over the last month, feeling of pounding rapid      heartbeats without syncope, and general malaise and clamminess.      The patient's electrocardiogram is fairly nonspecific.  She had a      CT scan of her chest done recently that did not show any pulmonary      embolus.  She has a minor possible nodule as described with planned      followup (seems unlikely this is symptom provoking at this time).      I reviewed the patient's prior testing and discussed the symptoms      with her today.  Our plan will be to provide a 48 hour Holter      monitor, as she is describing daily pounding heartbeats to exclude      any potential dysrhythmia.  I would also like an echocardiogram and      adenosine Myoview to assess cardiac structure and function and rule      out ischemia.  I will then have her come back to the office to      discuss the results.  2. Further plans to follow.     Jonelle Sidle, MD  Electronically Signed    SGM/MedQ  DD: 12/06/2006  DT: 12/07/2006  Job #: [id number]   cc:   Willow Ora, MD

## 2010-06-16 NOTE — Assessment & Plan Note (Signed)
[facility]                                   ON-CALL NOTE   Samantha [name], Samantha [name]                   MRN:          [id number]  DATE:11/17/2005                            DOB:          03-18-1940    DATE/TIME OF CALL:  November 17, 2005 at 7:30 a.m.   PRIMARY DOCTOR:  Dr. Drue Novel   HOME PHONE NUMBER:  [phone]   SUBJECTIVE:  [name] has been having severe diarrhea with multiple watery  bowels for approximately 4 days.  It has gotten to the point where she is  having bowel movements in her sleep.  She feels sweaty, feverish, she has  nausea although she is not vomiting, she feels dizzy at all times, is  reporting palpitations and some shortness of breath.  She is trying to keep  in fluids but is having difficulty drinking fluids because of the nausea and  because it goes straight through her.  She denies blood in her stool.   PLAN:  Diarrhea:  This is most likely a viral infection.  With the patient's  symptoms of possible mild to moderate dehydration as well as her new  shortness of breath, I feel that she needs to be evaluated at the emergency  room.  The patient will go there by being transported by a family member.       Kerby Nora, MD      AB/MedQ  DD:  11/17/2005  DT:  11/19/2005  Job #:  [id number]   cc:   Willow Ora, MD

## 2010-07-18 ENCOUNTER — Ambulatory Visit (INDEPENDENT_AMBULATORY_CARE_PROVIDER_SITE_OTHER): Payer: Medicare Other | Admitting: Gastroenterology

## 2010-07-18 ENCOUNTER — Encounter: Payer: Self-pay | Admitting: Gastroenterology

## 2010-07-18 VITALS — BP 136/72 | HR 68 | Ht 63.0 in | Wt 186.3 lb

## 2010-07-18 DIAGNOSIS — R933 Abnormal findings on diagnostic imaging of other parts of digestive tract: Secondary | ICD-10-CM

## 2010-07-18 NOTE — Progress Notes (Signed)
    HPI: This is a  very pleasant 70 year old woman  She has mild intermittent red rectal bleeding (for many years). No changes in her bowels.  Overall she has lost 15 pounds in past 2 years.  CBC 2 months ago was normal  Had a colonoscopy with SML in 09/2004, this was done for "history of adenomatous polyps" however I cannot find any records of adenomatous polyps ever being removed from her colon. An office note prior to that colonoscopy in 2006 by [facility] does state that she had a colonoscopy 2 years prior and a "polyp was removed." No furthe details of the polyp, date of procedure were describe.  She had a colonoscopy in [place] [facility] [place])  Review of systems: Pertinent positive and negative review of systems were noted in the above HPI section.  All other review of systems was otherwise negative.   Past Medical History  Diagnosis Date  . OSA (obstructive sleep apnea) 09/2007    dx w/ a sleep study, Rx CPAP, weight loss  . COPD (chronic obstructive pulmonary disease) 01/29/2002    Wert, disproved 07/06/09 w/ nl pft's  . Hyperlipidemia   . Hypertension   . Chest pain, atypical 11/2006    saw cards: neg/normal Holter, ECHo and myoview, admitted w/ CP 4/09 neg enz.and CT chest had a 9 beat NSVT  . Fibromyalgia   . Abnormal CT of the chest 2008    follow up CT, April 2009: unchanged. No f/u suggested   . 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 Shingles 11/2009  . Diabetes mellitus     Past Surgical History  Procedure Date  . Thyroidectomy, partial     in the 60s  . Cervical laminectomy 2001    in [place]  . Abdominal hysterectomy   . Foot surgery   . Breast biopsy 1999     reports that she has quit smoking. She has never used smokeless tobacco. She reports that she does not drink alcohol or use illicit drugs.  family history includes Allergies in her sister; Asthma in her paternal grandmother and sister; Heart disease in her brother, father, mother, and  unspecified family member; and Lung cancer in her mother.  There is no history of Breast cancer and Colon cancer.    Current Medications, Allergies were all reviewed with the patient via [facility] electronic medical record system.    Physical Exam: BP 136/72  Pulse 68  Ht 5\' 3"  (1.6 m)  Wt 186 lb 4.8 oz (84.505 kg)  BMI 33.00 kg/m2 Constitutional: generally well-appearing Psychiatric: alert and oriented x3 Eyes: extraocular movements intact Mouth: oral pharynx moist, no lesions Neck: supple no lymphadenopathy Cardiovascular: heart regular rate and rhythm Lungs: clear to auscultation bilaterally Abdomen: soft, nontender, nondistended, no obvious ascites, no peritoneal signs, normal bowel sounds Extremities: no lower extremity edema bilaterally Skin: no lesions on visible extremities    Assessment and plan: 70 y.o. female with history of colon polyp 8-10 years ago, unclear pathology  We will work to get records from her previous colonoscopy, passed solid U. reports. If she had an adenomatous polyp removed and then she required surveillance colonoscopy at this point. She will also get a basic set of Hemoccults and send those in for review. I will decide based on those results and reviewing her previous colonoscopy when she needs her next examination for cancer screening, perhaps polyp surveillance.

## 2010-07-18 NOTE — Patient Instructions (Addendum)
We will get records from [facility] [place] colonoscopy, polyp pathology form early 2000s. 2 sets of stool hemoccult cards. After reviewing all the above information, will decide when you need your next colonoscopy. A copy of this information will be made available to Dr. Drue Novel.

## 2010-07-24 ENCOUNTER — Telehealth: Payer: Self-pay

## 2010-07-24 NOTE — Telephone Encounter (Signed)
Dr Christella Hartigan I have tried to get records for this pt and the only two places that the pt says the procedures could be say they have never seen the pt.  I called her back and she has no further info.

## 2010-07-25 NOTE — Telephone Encounter (Signed)
Pt aware she is working on the hem cards now recall in [employer]

## 2010-07-25 NOTE — Telephone Encounter (Signed)
OK, can you make sure she send in hemoccult tests (2 sets).  If any are positive, then colonosocpy now.  If none are positive, then colonoscopy 09/2014.  thanks

## 2010-08-22 ENCOUNTER — Other Ambulatory Visit: Payer: Medicare Other

## 2010-08-22 ENCOUNTER — Other Ambulatory Visit: Payer: Self-pay | Admitting: Gastroenterology

## 2010-08-22 DIAGNOSIS — Z1289 Encounter for screening for malignant neoplasm of other sites: Secondary | ICD-10-CM

## 2010-08-22 LAB — HEMOCCULT SLIDES (X 3 CARDS)
Fecal Occult Blood: NEGATIVE
OCCULT 2: NEGATIVE
OCCULT 3: NEGATIVE
OCCULT 4: NEGATIVE
OCCULT 4: NEGATIVE

## 2010-09-28 ENCOUNTER — Encounter: Payer: Self-pay | Admitting: Internal Medicine

## 2010-10-24 LAB — CK TOTAL AND CKMB (NOT AT ARMC)
CK, MB: 6.4 — ABNORMAL HIGH
Relative Index: 1.6
Total CK: 395 — ABNORMAL HIGH

## 2010-10-24 LAB — DIFFERENTIAL
Basophils Absolute: 0
Basophils Relative: 1
Eosinophils Relative: 3
Lymphocytes Relative: 36

## 2010-10-24 LAB — D-DIMER, QUANTITATIVE: D-Dimer, Quant: 0.63 — ABNORMAL HIGH

## 2010-10-24 LAB — POCT I-STAT, CHEM 8
BUN: 13
Calcium, Ion: 1.25
Chloride: 104
Creatinine, Ser: 0.9
Glucose, Bld: 95
TCO2: 26

## 2010-10-24 LAB — CARDIAC PANEL(CRET KIN+CKTOT+MB+TROPI)
CK, MB: 3.8
Relative Index: 1.4
Troponin I: <0.01

## 2010-10-24 LAB — CBC
Hemoglobin: 14
MCV: 84.2
Platelets: 166
RBC: 4.85
WBC: 4.7

## 2010-10-24 LAB — TROPONIN I: Troponin I: <0.01

## 2010-10-24 LAB — POCT CARDIAC MARKERS
Operator id: 146091
Operator id: 294521
Troponin i, poc: <0.05
Troponin i, poc: <0.05

## 2010-10-24 LAB — BASIC METABOLIC PANEL
Chloride: 100
Creatinine, Ser: 0.95
GFR calc Af Amer: >60
GFR calc non Af Amer: 59 — ABNORMAL LOW
Potassium: 4.2

## 2010-10-24 LAB — MAGNESIUM: Magnesium: 2.3

## 2010-11-08 LAB — CBC
MCV: 84.5
Platelets: 172
RDW: 13.6
WBC: 4.5

## 2010-11-08 LAB — BASIC METABOLIC PANEL
BUN: 16
Chloride: 104
Creatinine, Ser: 0.78
Glucose, Bld: 99

## 2010-11-15 ENCOUNTER — Other Ambulatory Visit: Payer: Self-pay | Admitting: Internal Medicine

## 2010-11-15 NOTE — Telephone Encounter (Signed)
Done

## 2010-11-29 ENCOUNTER — Other Ambulatory Visit: Payer: Self-pay

## 2010-11-29 NOTE — Telephone Encounter (Signed)
Ok #30, 3 RF 

## 2010-11-29 NOTE — Telephone Encounter (Signed)
Last OV and fill date 03/03/10

## 2010-11-30 ENCOUNTER — Encounter: Payer: Self-pay | Admitting: Internal Medicine

## 2010-11-30 ENCOUNTER — Ambulatory Visit (INDEPENDENT_AMBULATORY_CARE_PROVIDER_SITE_OTHER): Payer: Medicare Other | Admitting: Internal Medicine

## 2010-11-30 VITALS — BP 130/80 | HR 72 | Temp 98.4°F | Resp 18 | Ht 66.0 in | Wt 183.1 lb

## 2010-11-30 DIAGNOSIS — M542 Cervicalgia: Secondary | ICD-10-CM

## 2010-11-30 DIAGNOSIS — Z23 Encounter for immunization: Secondary | ICD-10-CM

## 2010-11-30 MED ORDER — PREGABALIN 100 MG PO CAPS: 100.0000 mg | ORAL_CAPSULE | Freq: Every day | ORAL | Status: DC

## 2010-11-30 MED ORDER — HYDROCODONE-ACETAMINOPHEN 5-325 MG PO TABS: 1.0000 | ORAL_TABLET | Freq: Four times a day (QID) | ORAL | Status: DC | PRN

## 2010-11-30 NOTE — Telephone Encounter (Signed)
DONE

## 2010-11-30 NOTE — Assessment & Plan Note (Addendum)
Acute on chronic neck pain, worried b/c got a letter from the surgical center alerting her about meningitis related to steroids shot, however she did not get the contaminated steroid medicine. Nevertheless she is concerned about meningitis ; of note  I saw a documented case of viral meningitis in this office last week. Her exam is benign, doubt she has any serious condition at this time. Rec conservative treatment,  RF norco See instructions

## 2010-11-30 NOTE — Progress Notes (Signed)
  Subjective:    Patient ID: Samantha [name], female    DOB: 12-09-1940, 70 y.o.   MRN: [id number]  HPI 5 days ago developed neck pain, right-sided, posterior. Describes as "hard to move the neck". Along with that she developed some headache, mostly at the foreead and temples, no global HA, not the worse  of her life. She had some low-grade fever up to 99. The surgical center of [place]  sent her a letter because she had eye surgery there recently, she received IV sedation and they were informing her that she did not get the any contaminated steroids that are causing meningitis in several people. Nevertheless she is concerned that her symptoms may be consistent with meningitis.  Past Medical History  Diagnosis Date  . OSA (obstructive sleep apnea) 09/2007    dx w/ a sleep study, Rx CPAP, weight loss  . COPD (chronic obstructive pulmonary disease) 01/29/2002    Wert, disproved 07/06/09 w/ nl pft's  . Hyperlipidemia   . Hypertension   . Chest pain, atypical 11/2006    saw cards: neg/normal Holter, ECHo and myoview, admitted w/ CP 4/09 neg enz.and CT chest had a 9 beat NSVT  . Fibromyalgia   . Abnormal CT of the chest 2008    follow up CT, April 2009: unchanged. No f/u suggested   . 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 Shingles 11/2009  . Diabetes mellitus    Past Surgical History  Procedure Date  . Thyroidectomy, partial     in the 60s  . Cervical laminectomy 2001    in [place]  . Abdominal hysterectomy   . Foot surgery   . Breast biopsy 1999     Review of Systems Temperature has not gotten over 99, no chills. No running nose or sore throat. Some dry cough, no chest congestion. Chronic on and off nausea only w/ certain medicines, no vomiting. Denies any allergies or sick contacts.    Objective:   Physical Exam  Constitutional: She is oriented to person, place, and time. She appears well-developed and well-nourished. No distress.  HENT:  Head: Normocephalic and  atraumatic.  Nose: Nose normal.       Face is symmetric and nontender to palpation  Neck:       Nontender to palpation, range of motion is normal except for limited  hyperextension  (unable to do since she had surgery in 2001). No stiffness noted  Neurological: She is alert and oriented to person, place, and time.       Motor and DTRs symmetric.  Skin: Skin is warm and dry. She is not diaphoretic.  Psychiatric: She has a normal mood and affect. Her behavior is normal. Judgment and thought content normal.          Assessment & Plan:

## 2010-11-30 NOTE — Patient Instructions (Signed)
Est, take tylenol or hydrocodone for pain Call if no better in few days Call if pain or headache severe, high fever, rash

## 2010-12-07 ENCOUNTER — Encounter: Payer: Self-pay | Admitting: Internal Medicine

## 2010-12-07 ENCOUNTER — Ambulatory Visit (INDEPENDENT_AMBULATORY_CARE_PROVIDER_SITE_OTHER): Payer: Medicare Other | Admitting: Internal Medicine

## 2010-12-07 VITALS — BP 144/74 | HR 98 | Temp 98.1°F | Resp 18 | Ht 65.0 in | Wt 185.3 lb

## 2010-12-07 DIAGNOSIS — M542 Cervicalgia: Secondary | ICD-10-CM

## 2010-12-07 DIAGNOSIS — E785 Hyperlipidemia, unspecified: Secondary | ICD-10-CM

## 2010-12-07 DIAGNOSIS — I10 Essential (primary) hypertension: Secondary | ICD-10-CM

## 2010-12-07 DIAGNOSIS — E119 Type 2 diabetes mellitus without complications: Secondary | ICD-10-CM

## 2010-12-07 LAB — LIPID PANEL
Cholesterol: 212 mg/dL — ABNORMAL HIGH (ref 0–200)
HDL: 77.4 mg/dL (ref >39.00)
Triglycerides: 84 mg/dL (ref 0.0–149.0)

## 2010-12-07 LAB — BASIC METABOLIC PANEL
CO2: 30 mEq/L (ref 19–32)
Calcium: 9.2 mg/dL (ref 8.4–10.5)
GFR: 83.13 mL/min (ref >60.00)
Sodium: 140 mEq/L (ref 135–145)

## 2010-12-07 LAB — HEMOGLOBIN A1C: Hgb A1c MFr Bld: 6.4 % (ref 4.6–6.5)

## 2010-12-07 NOTE — Progress Notes (Signed)
  Subjective:    Patient ID: Samantha [name], female    DOB: May 08, 1940, 70 y.o.   MRN: [id number]  HPI Routine office visit Diabetes--good medication compliance, ambulatory blood sugars always less than 140. Diet is okay although admits to eating some candy during the holidays. Hypertension--good medication compliance lately her BP at home has been elevated, systolic blood pressure in the 138s. Reports that she is under a lot of stress due to her husband illness. GERD--good medication compliance, essentially asymptomatic.  Past Medical History  Diagnosis Date  . OSA (obstructive sleep apnea) 09/2007    dx w/ a sleep study, Rx CPAP, weight loss  . COPD (chronic obstructive pulmonary disease) 01/29/2002    Wert, disproved 07/06/09 w/ nl pft's  . Hyperlipidemia   . Hypertension   . Chest pain, atypical 11/2006    saw cards: neg/normal Holter, ECHo and myoview, admitted w/ CP 4/09 neg enz.and CT chest had a 9 beat NSVT  . Fibromyalgia   . Abnormal CT of the chest 2008    follow up CT, April 2009: unchanged. No f/u suggested   . 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 Shingles 11/2009  . Diabetes mellitus    Review of Systems Was recently seen with neck pain, feeling much better. No chest pain or shortness of breath No nausea, vomiting, diarrhea. No myalgias per se at this point.     Objective:   Physical Exam  Constitutional: She is oriented to person, place, and time. She appears well-developed and well-nourished.  HENT:  Head: Normocephalic and atraumatic.  Cardiovascular: Normal rate, regular rhythm and normal heart sounds.   No murmur heard. Pulmonary/Chest: Effort normal and breath sounds normal. No respiratory distress. She has no wheezes. She has no rales.  Musculoskeletal: She exhibits no edema.  Neurological: She is alert and oriented to person, place, and time.       Assessment & Plan:

## 2010-12-07 NOTE — Assessment & Plan Note (Signed)
Last FLP LDL was not at goal, labs, ?start meds

## 2010-12-07 NOTE — Patient Instructions (Addendum)
Check the  blood pressure 2 or 3 times a week, be sure it is less than 140/85. If it is consistently higher, let me know  

## 2010-12-07 NOTE — Assessment & Plan Note (Signed)
Improved

## 2010-12-07 NOTE — Assessment & Plan Note (Signed)
good medication compliance Labs  

## 2010-12-07 NOTE — Assessment & Plan Note (Signed)
slt elevated lately when check at home. No change for now, see instructions

## 2010-12-11 ENCOUNTER — Other Ambulatory Visit: Payer: Self-pay

## 2010-12-11 MED ORDER — PRAVASTATIN SODIUM 40 MG PO TABS: 40.0000 mg | ORAL_TABLET | Freq: Every evening | ORAL | Status: DC

## 2010-12-12 ENCOUNTER — Ambulatory Visit: Payer: Medicare Other | Admitting: Internal Medicine

## 2010-12-23 ENCOUNTER — Other Ambulatory Visit: Payer: Self-pay | Admitting: Internal Medicine

## 2011-01-04 ENCOUNTER — Ambulatory Visit: Payer: Medicare Other | Admitting: Cardiology

## 2011-01-06 ENCOUNTER — Other Ambulatory Visit: Payer: Self-pay | Admitting: Cardiology

## 2011-01-08 ENCOUNTER — Other Ambulatory Visit: Payer: Self-pay | Admitting: *Deleted

## 2011-01-08 MED ORDER — NEBIVOLOL HCL 20 MG PO TABS: 20.0000 mg | ORAL_TABLET | Freq: Every day | ORAL | Status: DC

## 2011-01-11 ENCOUNTER — Encounter: Payer: Self-pay | Admitting: Cardiology

## 2011-01-11 ENCOUNTER — Ambulatory Visit (INDEPENDENT_AMBULATORY_CARE_PROVIDER_SITE_OTHER): Payer: Medicare Other | Admitting: Cardiology

## 2011-01-11 VITALS — BP 142/82 | HR 59 | Ht 65.0 in | Wt 189.0 lb

## 2011-01-11 DIAGNOSIS — I10 Essential (primary) hypertension: Secondary | ICD-10-CM

## 2011-01-11 MED ORDER — LISINOPRIL 40 MG PO TABS: 40.0000 mg | ORAL_TABLET | Freq: Every day | ORAL | Status: DC

## 2011-01-11 NOTE — Assessment & Plan Note (Signed)
She needs better control of her blood pressure predicted with her diabetes. We will maximize her ACE inhibitor. Blood pressure goals 135/85 or less. She'll follow with her primary care. We'll see her back on a p.r.n. Basis. I will schedule electrolytes in about a week.

## 2011-01-11 NOTE — Progress Notes (Signed)
HPI Samantha [name] comes in today for Evaluation and management of her history of palpitations, hypertension and hyperlipidemia.  Her blood pressure running consistently above 140. Diastolic are in the mid 80s. She is very careful with diet and salt. She does not smoke.  She's had no chest pain or palpitations. She denies orthopnea, edema.  Past Medical History  Diagnosis Date  . OSA (obstructive sleep apnea) 09/2007    dx w/ a sleep study, Rx CPAP, weight loss  . COPD (chronic obstructive pulmonary disease) 01/29/2002    Wert, disproved 07/06/09 w/ nl pft's  . Hyperlipidemia   . Hypertension   . Chest pain, atypical 11/2006    saw cards: neg/normal Holter, ECHo and myoview, admitted w/ CP 4/09 neg enz.and CT chest had a 9 beat NSVT  . Fibromyalgia   . Abnormal CT of the chest 2008    follow up CT, April 2009: unchanged. No f/u suggested   . 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 Shingles 11/2009  . Diabetes mellitus     Current Outpatient Prescriptions  Medication Sig Dispense Refill  . albuterol (VENTOLIN HFA) 108 (90 BASE) MCG/ACT inhaler Inhale 2 puffs into the lungs every 6 (six) hours as needed.  18 g  2  . aspirin 81 MG tablet Take 81 mg by mouth daily.        [name] CALCIUM PO Take 1 tablet by mouth daily.        [name] etodolac (LODINE XL) 400 MG 24 hr tablet Take 400 mg by mouth daily.        . famotidine (PEPCID) 20 MG tablet Take 20 mg by mouth. As needed in the evening       . hydrochlorothiazide 25 MG tablet Take 1/2 tab by mouth once a day.       [name] HYDROcodone-acetaminophen (NORCO) 5-325 MG per tablet Take 1 tablet by mouth every 6 (six) hours as needed.  40 tablet  0  . lisinopril (PRINIVIL,ZESTRIL) 20 MG tablet Take 20 mg by mouth daily.        . metFORMIN (GLUCOPHAGE-XR) 500 MG 24 hr tablet TAKE 1 TABLET BY MOUTH DAILY  30 tablet  3  . Multiple Vitamin (MULTIVITAMIN) capsule Take 1 capsule by mouth daily.        . Nebivolol HCl (BYSTOLIC) 20 MG TABS Take 1 tablet  (20 mg total) by mouth daily.  30 tablet  3  . omeprazole (PRILOSEC OTC) 20 MG tablet Take 20 mg by mouth. Take one 30-60 min before first meal of the day       . Potassium 99 MG TABS Take by mouth.        . pravastatin (PRAVACHOL) 40 MG tablet Take 1 tablet (40 mg total) by mouth every evening.  30 tablet  5  . pregabalin (LYRICA) 100 MG capsule Take 1 capsule (100 mg total) by mouth at bedtime.  30 capsule  3    Allergies  Allergen Reactions  . Oxycodone Nausea And Vomiting  . Cefuroxime Axetil     REACTION: urticaria (hives)  . Terfenadine     REACTION: urticaria (hives)  . Tramadol     REACTION: vomitting    Family History  Problem Relation Age of Onset  . Allergies Sister   . Asthma Sister   . Asthma Paternal Grandmother   . Heart disease Father   . Heart disease Mother   . Heart disease  paternal grandparents, maternal grandparents,   . Heart disease Brother   . Lung cancer Mother   . Breast cancer Neg Hx   . Colon cancer Neg Hx     History   Social History  . Marital Status: Married    Spouse Name: N/A    Number of Children: 2  . Years of Education: N/A   Occupational History  . Retired    Social History Main Topics  . Smoking status: Former Games developer  . Smokeless tobacco: Never Used  . Alcohol Use: No  . Drug Use: No  . Sexually Active: Not on file   Other Topics Concern  . Not on file   Social History Narrative   On disability since 2000--- also husband has MS    ROS ALL NEGATIVE EXCEPT THOSE NOTED IN HPI  PE  General Appearance: well developed, well nourished in no acute distress, obese HEENT: symmetrical face, PERRLA, good dentition  Neck: no JVD, thyromegaly, or adenopathy, trachea midline Chest: symmetric without deformity Cardiac: PMI non-displaced, RRR, normal S1, S2, no gallop or murmur Lung: clear to ausculation and percussion Vascular: all pulses full without bruits  Abdominal: nondistended, nontender, good bowel sounds, no HSM,  no bruits Extremities: no cyanosis, clubbing or edema, no sign of DVT, no varicosities  Skin: normal color, no rashes Neuro: alert and oriented x 3, non-focal Pysch: normal affect  EKG Sinus bradycardia, first degree block otherwise normal EKG BMET    Component Value Date/Time   NA 140 12/07/2010 1039   K 3.9 12/07/2010 1039   CL 103 12/07/2010 1039   CO2 30 12/07/2010 1039   GLUCOSE 98 12/07/2010 1039   BUN 21 12/07/2010 1039   CREATININE 0.9 12/07/2010 1039   CALCIUM 9.2 12/07/2010 1039   GFRNONAA 89.25 01/05/2010 1000   GFRAA 81 09/16/2007 1559    Lipid Panel     Component Value Date/Time   CHOL 212* 12/07/2010 1039   TRIG 84.0 12/07/2010 1039   HDL 77.40 12/07/2010 1039   CHOLHDL 3 12/07/2010 1039   VLDL 16.8 12/07/2010 1039   LDLCALC 110* 06/06/2010 1200    CBC    Component Value Date/Time   WBC 7.1 05/01/2010 1243   RBC 4.42 05/01/2010 1243   HGB 13.1 05/01/2010 1243   HCT 38.3 05/01/2010 1243   PLT 155.0 05/01/2010 1243   MCV 86.7 05/01/2010 1243   MCHC 34.1 05/01/2010 1243   RDW 14.0 05/01/2010 1243   LYMPHSABS 1.8 05/01/2010 1243   MONOABS 0.6 05/01/2010 1243   EOSABS 0.1 05/01/2010 1243   BASOSABS 0.0 05/01/2010 1243

## 2011-01-11 NOTE — Patient Instructions (Signed)
Your physician recommends that you return for lab work in: 1 week for a bmp.  We will call you with your results.  Your physician has recommended you make the following change in your medication: Increase Lisinopril  Your physician has requested that you regularly monitor and record your blood pressure readings at home. Please use the same machine at the same time of day to check your readings and record them to bring to your follow-up visit.  Goal is less than 135/85  Your physician recommends that you continue to follow-up regularly with Dr. Drue Novel

## 2011-01-15 ENCOUNTER — Other Ambulatory Visit: Payer: Self-pay | Admitting: Internal Medicine

## 2011-01-15 NOTE — Telephone Encounter (Signed)
Ok 30, 6 RF 

## 2011-01-15 NOTE — Telephone Encounter (Signed)
Last filled 01-11-11,last ov 12-02-09 #[id number] [phone]

## 2011-01-16 MED ORDER — ETODOLAC ER 400 MG PO TB24: 400.0000 mg | ORAL_TABLET | Freq: Every day | ORAL | Status: AC

## 2011-01-16 NOTE — Telephone Encounter (Signed)
Rx sent 

## 2011-01-18 ENCOUNTER — Ambulatory Visit (INDEPENDENT_AMBULATORY_CARE_PROVIDER_SITE_OTHER): Payer: Medicare Other | Admitting: *Deleted

## 2011-01-18 DIAGNOSIS — I10 Essential (primary) hypertension: Secondary | ICD-10-CM

## 2011-01-18 LAB — BASIC METABOLIC PANEL
BUN: 21 mg/dL (ref 6–23)
CO2: 27 mEq/L (ref 19–32)
Chloride: 106 mEq/L (ref 96–112)
Creatinine, Ser: 0.9 mg/dL (ref 0.4–1.2)
Glucose, Bld: 111 mg/dL — ABNORMAL HIGH (ref 70–99)
Potassium: 4.1 mEq/L (ref 3.5–5.1)

## 2011-01-19 ENCOUNTER — Emergency Department (INDEPENDENT_AMBULATORY_CARE_PROVIDER_SITE_OTHER): Payer: Medicare Other

## 2011-01-19 ENCOUNTER — Emergency Department (HOSPITAL_COMMUNITY): Payer: Medicare Other

## 2011-01-19 ENCOUNTER — Emergency Department (INDEPENDENT_AMBULATORY_CARE_PROVIDER_SITE_OTHER)
Admission: EM | Admit: 2011-01-19 | Discharge: 2011-01-19 | Disposition: A | Discharge status: Home / Self Care | Payer: Medicare Other | Source: Home / Self Care | Attending: Emergency Medicine | Admitting: Emergency Medicine

## 2011-01-19 ENCOUNTER — Encounter (HOSPITAL_COMMUNITY): Payer: Self-pay | Admitting: *Deleted

## 2011-01-19 DIAGNOSIS — M461 Sacroiliitis, not elsewhere classified: Secondary | ICD-10-CM

## 2011-01-19 DIAGNOSIS — S39012A Strain of muscle, fascia and tendon of lower back, initial encounter: Secondary | ICD-10-CM

## 2011-01-19 DIAGNOSIS — S335XXA Sprain of ligaments of lumbar spine, initial encounter: Secondary | ICD-10-CM

## 2011-01-19 DIAGNOSIS — M4306 Spondylolysis, lumbar region: Secondary | ICD-10-CM

## 2011-01-19 DIAGNOSIS — M47818 Spondylosis without myelopathy or radiculopathy, sacral and sacrococcygeal region: Secondary | ICD-10-CM

## 2011-01-19 DIAGNOSIS — M47817 Spondylosis without myelopathy or radiculopathy, lumbosacral region: Secondary | ICD-10-CM

## 2011-01-19 DIAGNOSIS — M4310 Spondylolisthesis, site unspecified: Secondary | ICD-10-CM

## 2011-01-19 MED ORDER — CYCLOBENZAPRINE HCL 5 MG PO TABS: 5.0000 mg | ORAL_TABLET | Freq: Three times a day (TID) | ORAL | Status: AC | PRN

## 2011-01-19 MED ORDER — HYDROCODONE-ACETAMINOPHEN 5-325 MG PO TABS: ORAL_TABLET | ORAL | Status: AC
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
## 2011-02-02 ENCOUNTER — Ambulatory Visit (INDEPENDENT_AMBULATORY_CARE_PROVIDER_SITE_OTHER): Payer: Medicare Other | Admitting: Internal Medicine

## 2011-02-02 VITALS — BP 159/82 | HR 65 | Temp 98.3°F | Ht 64.0 in | Wt 182.0 lb

## 2011-02-02 DIAGNOSIS — M1990 Unspecified osteoarthritis, unspecified site: Secondary | ICD-10-CM | POA: Diagnosis not present

## 2011-02-02 DIAGNOSIS — I10 Essential (primary) hypertension: Secondary | ICD-10-CM

## 2011-02-02 DIAGNOSIS — E785 Hyperlipidemia, unspecified: Secondary | ICD-10-CM

## 2011-02-02 MED ORDER — AMLODIPINE BESYLATE 5 MG PO TABS: 5.0000 mg | ORAL_TABLET | Freq: Every day | ORAL | Status: DC

## 2011-02-02 NOTE — Assessment & Plan Note (Addendum)
Blood pressure continued to be elevated. Chart is reviewed, at some point she was taking amlodipine with apparent success however on 06/2009, patient was seen by pulmonary with dyspnea on exertion and recommended to avoid CCB  D/t the effect on LES in pt with suspected GERD/LPR/VCD Plan: Reintroduce a  low dose of amlodipine 5 mg ---> if  her BP  improves and she does not have dyspnea on exertion or cough, will stay on that medication.
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
## 2011-02-02 NOTE — Assessment & Plan Note (Signed)
On Pravachol, labs,see  instructions

## 2011-02-02 NOTE — Patient Instructions (Signed)
Come back fasting next week for labs only-----> FLP, AST, ALT , dx  hyperlipidemia

## 2011-02-04 ENCOUNTER — Encounter: Payer: Self-pay | Admitting: Internal Medicine

## 2011-02-07 ENCOUNTER — Other Ambulatory Visit: Payer: Self-pay | Admitting: Internal Medicine

## 2011-02-07 DIAGNOSIS — E785 Hyperlipidemia, unspecified: Secondary | ICD-10-CM

## 2011-02-08 ENCOUNTER — Other Ambulatory Visit (INDEPENDENT_AMBULATORY_CARE_PROVIDER_SITE_OTHER): Payer: Medicare Other

## 2011-02-08 DIAGNOSIS — E785 Hyperlipidemia, unspecified: Secondary | ICD-10-CM | POA: Diagnosis not present

## 2011-02-08 LAB — LIPID PANEL
LDL Cholesterol: 91 mg/dL (ref 0–99)
Total CHOL/HDL Ratio: 3
VLDL: 20.8 mg/dL (ref 0.0–40.0)

## 2011-02-08 LAB — ALT: ALT: 23 U/L (ref 0–35)

## 2011-02-08 LAB — AST: AST: 25 U/L (ref 0–37)

## 2011-02-15 ENCOUNTER — Encounter: Payer: Self-pay | Admitting: Internal Medicine

## 2011-02-22 ENCOUNTER — Other Ambulatory Visit: Payer: Self-pay | Admitting: *Deleted

## 2011-02-22 MED ORDER — HYDROCODONE-ACETAMINOPHEN 5-325 MG PO TABS: 1.0000 | ORAL_TABLET | Freq: Four times a day (QID) | ORAL | Status: DC | PRN

## 2011-03-02 ENCOUNTER — Other Ambulatory Visit: Payer: Self-pay | Admitting: Internal Medicine

## 2011-03-02 NOTE — Telephone Encounter (Signed)
Refill done.  

## 2011-04-29 ENCOUNTER — Other Ambulatory Visit: Payer: Self-pay | Admitting: Internal Medicine

## 2011-04-30 NOTE — Telephone Encounter (Signed)
Refill done.  

## 2011-05-02 ENCOUNTER — Emergency Department (INDEPENDENT_AMBULATORY_CARE_PROVIDER_SITE_OTHER)
Admission: EM | Admit: 2011-05-02 | Discharge: 2011-05-02 | Disposition: A | Discharge status: Home / Self Care | Payer: Medicare Other | Source: Home / Self Care | Attending: Family Medicine | Admitting: Family Medicine

## 2011-05-02 ENCOUNTER — Encounter (HOSPITAL_COMMUNITY): Payer: Self-pay | Admitting: *Deleted

## 2011-05-02 DIAGNOSIS — J309 Allergic rhinitis, unspecified: Secondary | ICD-10-CM | POA: Diagnosis not present

## 2011-05-02 DIAGNOSIS — J302 Other seasonal allergic rhinitis: Secondary | ICD-10-CM

## 2011-05-02 HISTORY — DX: Dysplasia of vagina, unspecified: N89.3

## 2011-05-02 HISTORY — DX: Pneumonia, unspecified organism: J18.9

## 2011-05-02 MED ORDER — FLUTICASONE PROPIONATE 50 MCG/ACT NA SUSP: 2.0000 | Freq: Every day | NASAL | Status: DC

## 2011-05-02 MED ORDER — CETIRIZINE HCL 10 MG PO TABS: 10.0000 mg | ORAL_TABLET | Freq: Every day | ORAL | Status: DC

## 2011-05-02 NOTE — ED Notes (Signed)
C/O starting with sore throat approx 1 wk ago; has progressed into sinus/nasal/chest congestion.  Has been taking Robitussin DM, Mucinex DM, and Alka Seltzer Plus.

## 2011-05-02 NOTE — ED Provider Notes (Signed)
History     CSN: [id number]  Arrival date & time 05/02/11  1712   First MD Initiated Contact with Patient 05/02/11 1717      Chief Complaint  Patient presents with  . Nasal Congestion    (Consider location/radiation/quality/duration/timing/severity/associated sxs/prior treatment) Patient is a 71 y.o. female presenting with URI. The history is provided by the patient.  URI The primary symptoms include sore throat and cough. Primary symptoms do not include fever, wheezing, nausea, vomiting, myalgias or rash. The current episode started 3 to 5 days ago. This is a new problem. The problem has not changed since onset. Symptoms associated with the illness include congestion and rhinorrhea. The illness is not associated with chills.    Past Medical History  Diagnosis Date  . OSA (obstructive sleep apnea) 09/2007    dx w/ a sleep study, Rx CPAP, weight loss  . Hyperlipidemia   . Hypertension   . Chest pain, atypical 11/2006    saw cards: neg/normal Holter, ECHo and myoview, admitted w/ CP 4/09 neg enz.and CT chest had a 9 beat NSVT  . Fibromyalgia   . Abnormal CT of the chest 2008    follow up CT, April 2009: unchanged. No f/u suggested   . 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 Shingles 11/2009  . Diabetes mellitus   . Asthma   . Pneumonia   . Vaginal dysplasia     Past Surgical History  Procedure Date  . Thyroidectomy, partial     in the 60s  . Cervical laminectomy 2001    in [place]  . Abdominal hysterectomy   . Foot surgery   . Breast biopsy 1999  . Cervical fusion   . Abdominal hysterectomy     Family History  Problem Relation Age of Onset  . Allergies Sister   . Asthma Sister   . Asthma Paternal Grandmother   . Heart disease Father   . Heart disease Mother   . Heart disease      paternal grandparents, maternal grandparents,   . Heart disease Brother   . Lung cancer Mother   . Breast cancer Neg Hx   . Colon cancer Neg Hx     History    Substance Use Topics  . Smoking status: Former Games developer  . Smokeless tobacco: Never Used  . Alcohol Use: No    OB History    Grav Para Term Preterm Abortions TAB SAB Ect Mult Living                  Review of Systems  Constitutional: Negative for fever and chills.  HENT: Positive for congestion, sore throat, rhinorrhea and postnasal drip.   Respiratory: Positive for cough. Negative for wheezing.   Gastrointestinal: Negative for nausea and vomiting.  Musculoskeletal: Negative for myalgias.  Skin: Negative for rash.    Allergies  Oxycodone; Cefuroxime axetil; Seldane; and Tramadol  Home Medications   Current Outpatient Rx  Name Route Sig Dispense Refill  . ALBUTEROL SULFATE HFA 108 (90 BASE) MCG/ACT IN AERS Inhalation Inhale 2 puffs into the lungs every 6 (six) hours as needed. 18 g 2  . AMLODIPINE BESYLATE 5 MG PO TABS Oral Take 1 tablet (5 mg total) by mouth daily. 90 tablet 1  . ASPIRIN 81 MG PO TABS Oral Take 81 mg by mouth daily.      [name] CALCIUM PO Oral Take 1 tablet by mouth daily.      . ETODOLAC  ER 400 MG PO TB24 Oral Take 1 tablet (400 mg total) by mouth daily. 30 tablet 6  . FAMOTIDINE 20 MG PO TABS Oral Take 20 mg by mouth. As needed in the evening     . GLUCOSAMINE-CHONDROITIN-MSM 500-200-150 MG PO TABS Oral Take by mouth.    [name] HYDROCHLOROTHIAZIDE 25 MG PO TABS  TAKE 1/2 TABLET EVERY DAY 90 tablet 0  . HYDROCODONE-ACETAMINOPHEN 5-325 MG PO TABS Oral Take 1 tablet by mouth every 6 (six) hours as needed. 40 tablet 0  . LISINOPRIL 40 MG PO TABS Oral Take 1 tablet (40 mg total) by mouth daily. 30 tablet 11  . METFORMIN HCL ER 500 MG PO TB24  TAKE 1 TABLET BY MOUTH DAILY 30 tablet 6  . MULTIVITAMINS PO CAPS Oral Take 1 capsule by mouth daily.      . NEBIVOLOL HCL 20 MG PO TABS Oral Take 1 tablet (20 mg total) by mouth daily. 30 tablet 3  . OMEPRAZOLE MAGNESIUM 20 MG PO TBEC Oral Take 20 mg by mouth. Take one 30-60 min before first meal of the day     . POTASSIUM 99 MG PO  TABS Oral Take by mouth.      [name] PRAVASTATIN SODIUM 40 MG PO TABS Oral Take 1 tablet (40 mg total) by mouth every evening. 30 tablet 5  . PREGABALIN 100 MG PO CAPS Oral Take 1 capsule (100 mg total) by mouth at bedtime. 30 capsule 3  . CETIRIZINE HCL 10 MG PO TABS Oral Take 1 tablet (10 mg total) by mouth daily. One tab daily for allergies 30 tablet 1  . CYCLOBENZAPRINE HCL 5 MG PO TABS Oral Take 5 mg by mouth 3 (three) times daily as needed.      [name] FLUTICASONE PROPIONATE 50 MCG/ACT NA SUSP Nasal Place 2 sprays into the nose daily. 1 g 2    BP 134/88  Pulse 86  Temp(Src) 98.8 F (37.1 C) (Oral)  Resp 28  SpO2 100%  Physical Exam  Nursing note and vitals reviewed. Constitutional: She is oriented to person, place, and time. She appears well-developed and well-nourished.  HENT:  Head: Normocephalic.  Right Ear: External ear normal.  Left Ear: External ear normal.  Nose: Mucosal edema and rhinorrhea present.  Mouth/Throat: Posterior oropharyngeal erythema present.  Eyes: Conjunctivae are normal. Pupils are equal, round, and reactive to light.  Neck: Normal range of motion. Neck supple.  Cardiovascular: Normal rate, regular rhythm, normal heart sounds and intact distal pulses.   Pulmonary/Chest: Breath sounds normal.  Neurological: She is alert and oriented to person, place, and time.  Skin: Skin is warm and dry.    ED Course  Procedures (including critical care time)  Labs Reviewed - No data to display No results found.   1. Seasonal allergic rhinitis       MDM          Linna Hoff, MD 05/02/11 [phone]

## 2011-05-04 ENCOUNTER — Telehealth (HOSPITAL_COMMUNITY): Payer: Self-pay | Admitting: *Deleted

## 2011-05-04 ENCOUNTER — Telehealth: Payer: Self-pay | Admitting: Internal Medicine

## 2011-05-04 ENCOUNTER — Emergency Department (INDEPENDENT_AMBULATORY_CARE_PROVIDER_SITE_OTHER): Payer: Medicare Other

## 2011-05-04 ENCOUNTER — Emergency Department (INDEPENDENT_AMBULATORY_CARE_PROVIDER_SITE_OTHER)
Admission: EM | Admit: 2011-05-04 | Discharge: 2011-05-04 | Disposition: A | Discharge status: Home / Self Care | Payer: Medicare Other | Source: Home / Self Care | Attending: Family Medicine | Admitting: Family Medicine

## 2011-05-04 ENCOUNTER — Encounter (HOSPITAL_COMMUNITY): Payer: Self-pay | Admitting: *Deleted

## 2011-05-04 DIAGNOSIS — R0602 Shortness of breath: Secondary | ICD-10-CM | POA: Diagnosis not present

## 2011-05-04 DIAGNOSIS — R059 Cough, unspecified: Secondary | ICD-10-CM | POA: Diagnosis not present

## 2011-05-04 DIAGNOSIS — R05 Cough: Secondary | ICD-10-CM | POA: Diagnosis not present

## 2011-05-04 DIAGNOSIS — R509 Fever, unspecified: Secondary | ICD-10-CM | POA: Diagnosis not present

## 2011-05-04 DIAGNOSIS — J40 Bronchitis, not specified as acute or chronic: Secondary | ICD-10-CM

## 2011-05-04 MED ORDER — ALBUTEROL SULFATE (5 MG/ML) 0.5% IN NEBU
5.0000 mg | INHALATION_SOLUTION | Freq: Once | RESPIRATORY_TRACT | Status: AC
  Administered 2011-05-04: 5 mg via RESPIRATORY_TRACT

## 2011-05-04 MED ORDER — IPRATROPIUM BROMIDE 0.02 % IN SOLN
0.5000 mg | Freq: Once | RESPIRATORY_TRACT | Status: AC
  Administered 2011-05-04: 0.5 mg via RESPIRATORY_TRACT

## 2011-05-04 MED ORDER — AZITHROMYCIN 250 MG PO TABS: 250.0000 mg | ORAL_TABLET | Freq: Every day | ORAL | Status: DC

## 2011-05-04 MED ORDER — ALBUTEROL SULFATE (5 MG/ML) 0.5% IN NEBU
INHALATION_SOLUTION | RESPIRATORY_TRACT | Status: AC
  Filled 2011-05-04: qty 1

## 2011-05-04 NOTE — Telephone Encounter (Signed)
Caller: [place]/Patient; PCP: Willow Ora; CB#: [phone]; ; ; Calling emergently  regarding Sinus Pain and Pressure; states having some wheezing.  Onset of cold symptoms 1 week ago.  Has appt with Dr. Drue Novel 05/07/11 but states she has worsened.  Seen in UC 05/02/11 but told had viral illness.  States onset 05/01/11 of fever to 99.3.  Having to use albuterol q 4 h for wheezing.  Coughing up greenish sputum.  Per protocol, advised being seen within 4 hours; appts unavailable in Epic.  Advised UC; states will go to [facility].  Will keep appt 05/07/11 to follow up.

## 2011-05-04 NOTE — Discharge Instructions (Signed)
Continue to use your albuterol every 4 to 6 hours for the next 24 hours, then as needed. Take antibiotics as directed. I recommend aggressive fever control with acetaminophen (Tylenol) and/or ibuprofen. You may use these together, alternating them every 4 hours, or individually, every 8 hours. For example, take acetaminophen 500 to 1000 mg at 12 noon, then 600 to 800 mg of ibuprofen at 4 pm, then acetaminophen at 8 pm, etc. Also, stay hydrated with clear liquids. Return to care should your symptoms not improve, or worsen in any way

## 2011-05-04 NOTE — ED Provider Notes (Signed)
History     CSN: [id number]  Arrival date & time 05/04/11  1744   First MD Initiated Contact with Patient 05/04/11 1802      Chief Complaint  Patient presents with  . Cough  . Shortness of Breath  . Fever    (Consider location/radiation/quality/duration/timing/severity/associated sxs/prior treatment) HPI Comments: Samantha [name] presents for evaluation of persistent cough and chest congestion. She was evaluated here 2 days ago, diagnosed with rhinitis, and given prescriptions for Zyrtec and Flonase. Her cough has persisted and she now has fever and chills. Her temperature here today is 100.57F. She has been using albuterol at home without much relief. She reports that she has a followup appointment with her primary care provider on Monday. She also reports LEFT sided facial pain and sinus pressure.  Patient is a 71 y.o. female presenting with cough. The history is provided by the patient.  Cough This is a new problem. The current episode started more than 2 days ago. The problem occurs constantly. The problem has not changed since onset.The cough is productive of sputum. The maximum temperature recorded prior to her arrival was 100 to 100.9 F. The fever has been present for 1 to 2 days. Associated symptoms include chills, rhinorrhea, shortness of breath and wheezing. She is not a smoker.    Past Medical History  Diagnosis Date  . OSA (obstructive sleep apnea) 09/2007    dx w/ a sleep study, Rx CPAP, weight loss  . Hyperlipidemia   . Hypertension   . Chest pain, atypical 11/2006    saw cards: neg/normal Holter, ECHo and myoview, admitted w/ CP 4/09 neg enz.and CT chest had a 9 beat NSVT  . Fibromyalgia   . Abnormal CT of the chest 2008    follow up CT, April 2009: unchanged. No f/u suggested   . 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 Shingles 11/2009  . Diabetes mellitus   . Asthma   . Pneumonia   . Vaginal dysplasia     Past Surgical History  Procedure Date  .  Thyroidectomy, partial     in the 60s  . Cervical laminectomy 2001    in [place]  . Abdominal hysterectomy   . Foot surgery   . Breast biopsy 1999  . Cervical fusion   . Abdominal hysterectomy     Family History  Problem Relation Age of Onset  . Allergies Sister   . Asthma Sister   . Asthma Paternal Grandmother   . Heart disease Father   . Heart disease Mother   . Heart disease      paternal grandparents, maternal grandparents,   . Heart disease Brother   . Lung cancer Mother   . Breast cancer Neg Hx   . Colon cancer Neg Hx     History  Substance Use Topics  . Smoking status: Former Games developer  . Smokeless tobacco: Never Used  . Alcohol Use: No    OB History    Grav Para Term Preterm Abortions TAB SAB Ect Mult Living                  Review of Systems  Constitutional: Positive for fever and chills.  HENT: Positive for congestion and rhinorrhea.   Eyes: Negative.   Respiratory: Positive for cough, shortness of breath and wheezing.   Cardiovascular: Negative.   Gastrointestinal: Negative.   Genitourinary: Negative.   Musculoskeletal: Negative.   Skin: Negative.   Neurological: Negative.  Allergies  Oxycodone; Cefuroxime axetil; Seldane; and Tramadol  Home Medications   Current Outpatient Rx  Name Route Sig Dispense Refill  . ALBUTEROL SULFATE HFA 108 (90 BASE) MCG/ACT IN AERS Inhalation Inhale 2 puffs into the lungs every 6 (six) hours as needed. 18 g 2  . AMLODIPINE BESYLATE 5 MG PO TABS Oral Take 1 tablet (5 mg total) by mouth daily. 90 tablet 1  . ASPIRIN 81 MG PO TABS Oral Take 81 mg by mouth daily.      [name] CALCIUM PO Oral Take 1 tablet by mouth daily.      [name] CETIRIZINE HCL 10 MG PO TABS Oral Take 1 tablet (10 mg total) by mouth daily. One tab daily for allergies 30 tablet 1  . CYCLOBENZAPRINE HCL 5 MG PO TABS Oral Take 5 mg by mouth 3 (three) times daily as needed.      . ETODOLAC ER 400 MG PO TB24 Oral Take 1 tablet (400 mg total) by mouth daily. 30  tablet 6  . FAMOTIDINE 20 MG PO TABS Oral Take 20 mg by mouth. As needed in the evening     . FLUTICASONE PROPIONATE 50 MCG/ACT NA SUSP Nasal Place 2 sprays into the nose daily. 1 g 2  . GLUCOSAMINE-CHONDROITIN-MSM 500-200-150 MG PO TABS Oral Take by mouth.    [name] HYDROCHLOROTHIAZIDE 25 MG PO TABS  TAKE 1/2 TABLET EVERY DAY 90 tablet 0  . HYDROCODONE-ACETAMINOPHEN 5-325 MG PO TABS Oral Take 1 tablet by mouth every 6 (six) hours as needed. 40 tablet 0  . LISINOPRIL 40 MG PO TABS Oral Take 1 tablet (40 mg total) by mouth daily. 30 tablet 11  . METFORMIN HCL ER 500 MG PO TB24  TAKE 1 TABLET BY MOUTH DAILY 30 tablet 6  . MULTIVITAMINS PO CAPS Oral Take 1 capsule by mouth daily.      . NEBIVOLOL HCL 20 MG PO TABS Oral Take 1 tablet (20 mg total) by mouth daily. 30 tablet 3  . OMEPRAZOLE MAGNESIUM 20 MG PO TBEC Oral Take 20 mg by mouth. Take one 30-60 min before first meal of the day     . POTASSIUM 99 MG PO TABS Oral Take by mouth.      [name] PRAVASTATIN SODIUM 40 MG PO TABS Oral Take 1 tablet (40 mg total) by mouth every evening. 30 tablet 5  . PREGABALIN 100 MG PO CAPS Oral Take 1 capsule (100 mg total) by mouth at bedtime. 30 capsule 3  . AZITHROMYCIN 250 MG PO TABS Oral Take 1 tablet (250 mg total) by mouth daily. Take two tablets on first day, then one tablet each day for four days 6 tablet 0    BP 155/84  Pulse 76  Temp(Src) 100.2 F (37.9 C) (Oral)  Resp 18  SpO2 98%  Physical Exam  Nursing note and vitals reviewed. Constitutional: She is oriented to person, place, and time. She appears well-developed and well-nourished.  HENT:  Head: Normocephalic and atraumatic.  Right Ear: Tympanic membrane normal.  Left Ear: Tympanic membrane normal.  Mouth/Throat: Uvula is midline, oropharynx is clear and moist and mucous membranes are normal.  Eyes: EOM are normal. Pupils are equal, round, and reactive to light.  Neck: Normal range of motion.  Cardiovascular: Normal rate, regular rhythm, S1  normal, S2 normal and normal heart sounds.   No murmur heard. Pulmonary/Chest: Effort normal. She has no decreased breath sounds. She has wheezes in the right middle field, the right lower field, the  left middle field and the left lower field. She has no rhonchi.  Musculoskeletal: Normal range of motion.  Neurological: She is alert and oriented to person, place, and time.  Skin: Skin is warm and dry.  Psychiatric: Her behavior is normal.    ED Course  Procedures (including critical care time)  Labs Reviewed - No data to display Dg Chest 2 View  05/04/2011  *RADIOLOGY REPORT*  Clinical Data: Cough, shortness of breath, fever  CHEST - 2 VIEW  Comparison: Her chest x-ray of 05/01/2010  Findings: No active infiltrate or effusion is seen.  The heart is mildly enlarged and stable.  There are degenerative changes diffusely throughout the thoracic spine.  Hardware for posterior fusion of the lower cervical spine is noted.  IMPRESSION: No active lung disease.  Stable mild cardiomegaly.  Original Report Authenticated By: Juline Patch, M.D.     1. Bronchitis       MDM  Xray reviewed by radiologist and myself; no acute findings; Duoneb administered in clinic with some relief of wheezing and shortness of breath; rx given for z-pack if no improvement in sx        Renaee Munda, MD 05/04/11 1953

## 2011-05-04 NOTE — ED Notes (Signed)
Patient is resting comfortably. 

## 2011-05-04 NOTE — ED Notes (Signed)
Breathing treatment complete.  States breathing feels better.  BBS now clear.

## 2011-05-04 NOTE — ED Notes (Signed)
Pt seen in [facility] 4/3 - has been taking Zyrtec and Flonase as directed, as well as albuterol q 4 hrs for wheezing, SOB, congestion.  States she feels she is getting worse.  Continues with pain to left side of face.  Temps up to 100.2.  Has appt w/ PCP 4/8.

## 2011-05-04 NOTE — ED Notes (Signed)
Breathing treatment in progress

## 2011-05-04 NOTE — Telephone Encounter (Deleted)
Caller: Samantha [name]/Patient; PCP: Paz, Jose; CB#: [phone]; ; ; Calling emergently  regarding Sinus Pain and Pressure; states having some wheezing.  Onset of cold symptoms 1 week ago.  Has appt with Dr. Paz 05/07/11 but states she has worsened.  Seen in UC 05/02/11 but told had viral illness.  States onset 05/01/11 of fever to 99.3.  Having to use albuterol q 4 h for wheezing.  Coughing up greenish sputum.  Per protocol, advised being seen within 4 hours; appts unavailable in Epic.  Advised UC; states will go to [facility].  Will keep appt 05/07/11 to follow up.   

## 2011-05-04 NOTE — ED Notes (Signed)
Pt. called on VM @ 1055 and said she does not feel any better and is having more breathing problems. She wants me to ask Dr. Artis Flock for antibiotics. I called pt. back @ 1800 and told her the doctors won't call in antibiotics. She would have to come back and be rechecked. When I accessed chart I saw she was in the waiting room and told her she did the right thing to come back for a recheck. Vassie Moselle 05/04/2011

## 2011-05-07 ENCOUNTER — Ambulatory Visit (INDEPENDENT_AMBULATORY_CARE_PROVIDER_SITE_OTHER): Payer: Medicare Other | Admitting: Internal Medicine

## 2011-05-07 VITALS — BP 118/70 | HR 66 | Temp 98.4°F | Wt 185.0 lb

## 2011-05-07 DIAGNOSIS — I10 Essential (primary) hypertension: Secondary | ICD-10-CM

## 2011-05-07 DIAGNOSIS — E119 Type 2 diabetes mellitus without complications: Secondary | ICD-10-CM

## 2011-05-07 DIAGNOSIS — J449 Chronic obstructive pulmonary disease, unspecified: Secondary | ICD-10-CM | POA: Diagnosis not present

## 2011-05-07 DIAGNOSIS — J4489 Other specified chronic obstructive pulmonary disease: Secondary | ICD-10-CM

## 2011-05-07 MED ORDER — ALBUTEROL SULFATE HFA 108 (90 BASE) MCG/ACT IN AERS: 2.0000 | INHALATION_SPRAY | Freq: Four times a day (QID) | RESPIRATORY_TRACT | Status: DC | PRN

## 2011-05-07 NOTE — Assessment & Plan Note (Signed)
Presents today after 2 visits to the ER, currently taking ventolin  and finishing a Z-Pak. She is certainly better. Plan to continue with albuterol when necessary.

## 2011-05-07 NOTE — Progress Notes (Signed)
  Subjective:    Patient ID: Samantha [name], female    DOB: May 04, 1940, 70 y.o.   MRN: [id number]  HPI Routine office visit, we discussed the following issues: She was seen twice in the emergency room last week, notes are reviewed, initially diagnosed with a URI and eventually with bronchitis, chest x-ray was negative, she was given a DuoNeb nebulization at the ER and now she is on ventolin. She was also prescribed Z-Pak, and she is doing better. Hypertension, good medication compliance, normal BPs, no higher than 134 systolic. History of diabetes, she checked her blood sugar rarely and they are within normal. Good medication compliance.  Past Medical History: DM II Hyperlipidemia Hypertension Fibromyalgia   OSA dx w/ a  sleep study done  09/2007, Rx CPAP, weight loss Reactive airway dz --dx of pseudoasthma / vcd in 2005 and nl sprirometry --History of dyspnea, 2011,  improved after several medications were changed around --Question of COPD, disproved July 06, 2009 with nl pft's   atypical CP-  Nov 2008: saw cards: neg/normal Holter, ECHO and Myoview Admited w/ CP 4-09: neg enz. and CT chest. Had a 9 beat NSVT abnormal CT of the chest in 2008, follow-up CT , April 2009: Unchanged.  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 shingles, 11-11  Past Surgical History: partial thyroidectomy in the 60s Cervical laminectomy, in [place] , 2001   Hysterectomy Oophorectomy foot surgery neg cardiolite 10/05 barium swallow neg 2/04 EGD neg 9/06   Review of Systems She is still coughing and wheezing but definitely better than before, occasional green sputum.  She also had left facial pain, and that has improved. No nausea, vomiting, diarrhea     Objective:   Physical Exam  General -- alert, well-developed, and well-nourished. NAD  HEENT -- TMs normal, throat w/o redness, face symmetric and not tender to palpation, nose normal Lungs -- normal  respiratory effort, no intercostal retractions, no accessory muscle use, and few ronchi w/o wheezing Heart-- normal rate, regular rhythm, no murmur, and no gallop.   Extremities-- no pretibial edema bilaterally      Assessment & Plan:  Today , I spent more than 25 min with the patient, reviewing the chart and ER visits

## 2011-05-07 NOTE — Assessment & Plan Note (Signed)
Good compliance with medications, labs 

## 2011-05-07 NOTE — Telephone Encounter (Signed)
Noted  

## 2011-05-07 NOTE — Assessment & Plan Note (Signed)
Well-controlled, no change 

## 2011-05-08 ENCOUNTER — Encounter: Payer: Self-pay | Admitting: Internal Medicine

## 2011-05-10 LAB — HEMOGLOBIN A1C: Hgb A1c MFr Bld: 6.6 % — ABNORMAL HIGH (ref 4.6–6.5)

## 2011-05-14 ENCOUNTER — Encounter: Payer: Self-pay | Admitting: *Deleted

## 2011-06-23 ENCOUNTER — Other Ambulatory Visit: Payer: Self-pay | Admitting: Cardiology

## 2011-07-04 DIAGNOSIS — M159 Polyosteoarthritis, unspecified: Secondary | ICD-10-CM | POA: Diagnosis not present

## 2011-07-04 DIAGNOSIS — M25569 Pain in unspecified knee: Secondary | ICD-10-CM | POA: Diagnosis not present

## 2011-07-04 DIAGNOSIS — M25579 Pain in unspecified ankle and joints of unspecified foot: Secondary | ICD-10-CM | POA: Diagnosis not present

## 2011-07-04 DIAGNOSIS — M6530 Trigger finger, unspecified finger: Secondary | ICD-10-CM | POA: Diagnosis not present

## 2011-08-08 DIAGNOSIS — M159 Polyosteoarthritis, unspecified: Secondary | ICD-10-CM | POA: Diagnosis not present

## 2011-08-08 DIAGNOSIS — M67919 Unspecified disorder of synovium and tendon, unspecified shoulder: Secondary | ICD-10-CM | POA: Diagnosis not present

## 2011-08-08 DIAGNOSIS — Z79899 Other long term (current) drug therapy: Secondary | ICD-10-CM | POA: Diagnosis not present

## 2011-08-08 DIAGNOSIS — M25569 Pain in unspecified knee: Secondary | ICD-10-CM | POA: Diagnosis not present

## 2011-08-08 DIAGNOSIS — M719 Bursopathy, unspecified: Secondary | ICD-10-CM | POA: Diagnosis not present

## 2011-08-14 ENCOUNTER — Encounter: Payer: Self-pay | Admitting: Internal Medicine

## 2011-08-14 ENCOUNTER — Other Ambulatory Visit: Payer: Self-pay | Admitting: Internal Medicine

## 2011-08-14 ENCOUNTER — Telehealth: Payer: Self-pay | Admitting: *Deleted

## 2011-08-14 ENCOUNTER — Ambulatory Visit (INDEPENDENT_AMBULATORY_CARE_PROVIDER_SITE_OTHER): Payer: Medicare Other | Admitting: Internal Medicine

## 2011-08-14 VITALS — BP 150/86 | HR 64 | Temp 97.9°F | Wt 186.0 lb

## 2011-08-14 DIAGNOSIS — IMO0001 Reserved for inherently not codable concepts without codable children: Secondary | ICD-10-CM

## 2011-08-14 DIAGNOSIS — N390 Urinary tract infection, site not specified: Secondary | ICD-10-CM | POA: Diagnosis not present

## 2011-08-14 DIAGNOSIS — I10 Essential (primary) hypertension: Secondary | ICD-10-CM | POA: Diagnosis not present

## 2011-08-14 DIAGNOSIS — Z23 Encounter for immunization: Secondary | ICD-10-CM | POA: Diagnosis not present

## 2011-08-14 DIAGNOSIS — Z0000 Encounter for general adult medical examination without abnormal findings: Secondary | ICD-10-CM

## 2011-08-14 DIAGNOSIS — E119 Type 2 diabetes mellitus without complications: Secondary | ICD-10-CM

## 2011-08-14 DIAGNOSIS — E785 Hyperlipidemia, unspecified: Secondary | ICD-10-CM | POA: Diagnosis not present

## 2011-08-14 LAB — CBC WITH DIFFERENTIAL/PLATELET
Basophils Relative: 0.6 % (ref 0.0–3.0)
HCT: 39.7 % (ref 36.0–46.0)
Hemoglobin: 13.1 g/dL (ref 12.0–15.0)
Lymphocytes Relative: 42.5 % (ref 12.0–46.0)
Lymphs Abs: 1.8 10*3/uL (ref 0.7–4.0)
MCHC: 33 g/dL (ref 30.0–36.0)
Monocytes Relative: 6.8 % (ref 3.0–12.0)
Neutro Abs: 2 10*3/uL (ref 1.4–7.7)
RBC: 4.63 Mil/uL (ref 3.87–5.11)

## 2011-08-14 LAB — BASIC METABOLIC PANEL WITH GFR
Calcium: 9.2 mg/dL (ref 8.4–10.5)
GFR, Est African American: 84 mL/min
GFR, Est Non African American: 73 mL/min
Glucose, Bld: 102 mg/dL — ABNORMAL HIGH (ref 70–99)
Potassium: 4 mEq/L (ref 3.5–5.3)
Sodium: 139 mEq/L (ref 135–145)

## 2011-08-14 LAB — LIPID PANEL
Cholesterol: 187 mg/dL (ref 0–200)
LDL Cholesterol: 83 mg/dL (ref 0–99)
Total CHOL/HDL Ratio: 2

## 2011-08-14 LAB — URINALYSIS, MICROSCOPIC ONLY
Casts: NONE SEEN
Crystals: NONE SEEN

## 2011-08-14 LAB — URINALYSIS, ROUTINE W REFLEX MICROSCOPIC
Glucose, UA: NEGATIVE mg/dL
Ketones, ur: NEGATIVE mg/dL
Specific Gravity, Urine: 1.016 (ref 1.005–1.030)
pH: 5.5 (ref 5.0–8.0)

## 2011-08-14 LAB — ALT: ALT: 17 U/L (ref 0–35)

## 2011-08-14 MED ORDER — AMLODIPINE BESYLATE 10 MG PO TABS: 10.0000 mg | ORAL_TABLET | Freq: Every day | ORAL | Status: DC

## 2011-08-14 MED ORDER — ZOSTER VACCINE LIVE 19400 UNT/0.65ML ~~LOC~~ SOLR: 0.6500 mL | Freq: Once | SUBCUTANEOUS | Status: AC

## 2011-08-14 NOTE — Assessment & Plan Note (Signed)
Due for labs

## 2011-08-14 NOTE — Telephone Encounter (Signed)
Stat urine results placed on ledge for review.

## 2011-08-14 NOTE — Progress Notes (Signed)
Subjective:    Patient ID: Samantha [name], female    DOB: 1940/06/02, 71 y.o.   MRN: [id number]  HPI Here for Medicare AWV: 1. Risk factors based on Past M, S, F history: reviewed 2. Physical Activities: busy at home, yard work, no routine exercise 3. Depression/mood: No problems noted or reported despite a number of issues going on  4. Hearing:  No problems noted or reported 5. ADL's:  Independent  6. Fall Risk: low risk, prevention discussed  7. home Safety: does feel safe at home  8. Height, weight, &visual acuity: see VS, s/p lenses implant, great results  9. Counseling: provided 10. Labs ordered based on risk factors: if needed  11. Referral Coordination: if needed 12.  Care Plan, see assessment and plan  13.   Cognitive Assessment: Cognition and motor skills within normal  In addition, today we discussed the following: Diabetes, good medication compliance, ambulatory blood sugars between 90 and 100. High cholesterol, good medication compliance. Hypertension, ambulatory BPs range from 138-150/86. She takes her medication regularly. Reactive airway disease, uses albuterol on average once a week.  Past Medical History: DM II Hyperlipidemia Hypertension Fibromyalgia   OSA dx w/ a  sleep study done  09/2007, not using a CPAP, weight loss Reactive airway dz ------dx of pseudoasthma / vcd in 2005 and nl sprirometry ------History of dyspnea, 2011,  improved after several medications were changed around ------Question of COPD, disproved July 06, 2009 with nl pft's   atypical CP -------- Nov 2008: saw cards: neg/normal Holter, ECHO and Myoview --------Admited w/ CP 4-09: neg enz. and CT chest. Had a 9 beat NSVT abnormal CT of the chest in 2008, follow-up CT , April 2009: Unchanged.  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 shingles, 11-11 UTIs 2011  -- s/p cysto, renal u/s   Past Surgical History: partial thyroidectomy in the 60s Cervical  laminectomy, in [place] , 2001   Hysterectomy Oophorectomy foot surgery neg cardiolite 10/05 barium swallow neg 2/04 EGD neg 9/06  Family History: DM-- GM, M asthma: sister, paternal grandmother heart disease--father, mother, paternal grandparents, maternal grandparents, brother Lung cancer: mother  breast ca--no colon ca--no  Social History: Married, 2 children, on disability since 2000 smoker --quit in the late 90s ETOH-- no Diet-- healthy most of the time   Review of Systems No chest pain, shortness of breath. No lower extremity edema No dysuria or gross hematuria, urine syncope concentrated at this time. No nausea or vomiting, occasionally has heartburn at night despite taking PPIs daily, when she does she takes Pepcid at night and symptoms seem to subside. She does take frequently etodolac, she does it with food. Fibromyalgia symptoms relatively well controlled with Lyrica which helped her to sleep.     Objective:   Physical Exam  General -- alert, well-developed, and well-nourished.   Neck --no thyromegaly , normal carotid pulse, no supraclavicular mass Lungs -- normal respiratory effort, no intercostal retractions, no accessory muscle use, and normal breath sounds.   Heart-- normal rate, regular rhythm, no murmur, and no gallop.   Abdomen--soft, non-tender, no distention, no masses, no HSM, no guarding, and no rigidity.   DIABETIC FEET EXAM: No lower extremity edema Normal pedal pulses bilaterally Skin normal,nails slightly dystrophic, no calluses. She does have toe deformities. Pinprick examination of the feet normal. Neurologic-- alert & oriented X3 and strength normal in all extremities. Psych-- Cognition and judgment appear intact. Alert and cooperative with normal attention span and concentration.  not anxious appearing and not depressed appearing.       Assessment & Plan:

## 2011-08-14 NOTE — Assessment & Plan Note (Signed)
Check a UA

## 2011-08-14 NOTE — Assessment & Plan Note (Signed)
TD 2003 and today 07-2011  pneumonia shot 2004 and 2010 zostavax discussed, Rx provided   Female care per gynecology, to see Dr Vickey Sages in 2 weeks  Last MMG 08-2010 (-) Last DEXA? Reportedly @ gyn  Colonoscopy: 2006, next was due 2011

## 2011-08-14 NOTE — Assessment & Plan Note (Signed)
Needs better control, systolic BP going to the 150s. Continue with lisinopril 40, hydrochlorothiazide 12.5, bystolic and increased Norvasc from 5 mg to 10 mg. See instructions

## 2011-08-14 NOTE — Patient Instructions (Addendum)
Take calcium 1200 mg daily and vitamin D approximately 600 units every day Increase amlodipine from 5mg  to 10 mg every day Check the  blood pressure 2 or 3 times a week, be sure it is between 110/60 and 140/85. If it is consistently higher or lower, let me know Next office visit in 4-5 months.

## 2011-08-14 NOTE — Assessment & Plan Note (Signed)
Seems to be well-controlled with Lyrica. Take NSAIDs almost daily with no apparent side effects.

## 2011-08-14 NOTE — Telephone Encounter (Signed)
Thank you, it wasn't my intention to send the ua stat

## 2011-08-14 NOTE — Assessment & Plan Note (Signed)
Good compliance with medication, check FLP, AST, ALT. Continue with pravachol

## 2011-08-16 ENCOUNTER — Encounter: Payer: Self-pay | Admitting: *Deleted

## 2011-08-17 MED ORDER — SULFAMETHOXAZOLE-TRIMETHOPRIM 800-160 MG PO TABS: 1.0000 | ORAL_TABLET | Freq: Two times a day (BID) | ORAL | Status: AC

## 2011-08-17 NOTE — Addendum Note (Signed)
Addended by: Edwena Felty T on: 08/17/2011 03:49 PM   Modules accepted: Orders

## 2011-08-29 ENCOUNTER — Ambulatory Visit (INDEPENDENT_AMBULATORY_CARE_PROVIDER_SITE_OTHER): Payer: Medicare Other | Admitting: Internal Medicine

## 2011-08-29 ENCOUNTER — Encounter: Payer: Self-pay | Admitting: Internal Medicine

## 2011-08-29 ENCOUNTER — Telehealth: Payer: Self-pay | Admitting: *Deleted

## 2011-08-29 VITALS — BP 102/64 | HR 65 | Temp 98.1°F | Resp 14 | Wt 189.0 lb

## 2011-08-29 DIAGNOSIS — I499 Cardiac arrhythmia, unspecified: Secondary | ICD-10-CM

## 2011-08-29 DIAGNOSIS — G43809 Other migraine, not intractable, without status migrainosus: Secondary | ICD-10-CM | POA: Diagnosis not present

## 2011-08-29 DIAGNOSIS — R0789 Other chest pain: Secondary | ICD-10-CM | POA: Diagnosis not present

## 2011-08-29 DIAGNOSIS — R42 Dizziness and giddiness: Secondary | ICD-10-CM

## 2011-08-29 DIAGNOSIS — G43109 Migraine with aura, not intractable, without status migrainosus: Secondary | ICD-10-CM

## 2011-08-29 DIAGNOSIS — R61 Generalized hyperhidrosis: Secondary | ICD-10-CM | POA: Diagnosis not present

## 2011-08-29 LAB — CK TOTAL AND CKMB (NOT AT ARMC): Total CK: 445 U/L — ABNORMAL HIGH (ref 7–177)

## 2011-08-29 LAB — BASIC METABOLIC PANEL
CO2: 27 mEq/L (ref 19–32)
Calcium: 10 mg/dL (ref 8.4–10.5)
Chloride: 102 mEq/L (ref 96–112)
Potassium: 4.5 mEq/L (ref 3.5–5.3)
Sodium: 136 mEq/L (ref 135–145)

## 2011-08-29 LAB — TROPONIN I: Troponin I: <0.01 ng/mL (ref <0.06)

## 2011-08-29 NOTE — Patient Instructions (Addendum)
Repeat the isometric exercises discussed 4- 5 times prior to standing if you've been seated or lying  for a period of time. Drink Gatorade Lite instead of water if having excessive sweating. This will prevent loss of essential  Electrolytes. Please try to go on My Chart within the next 24 hours to allow me to release the results directly to you.

## 2011-08-29 NOTE — Addendum Note (Signed)
Addended by: Silvio Pate D on: 08/29/2011 04:40 PM   Modules accepted: Orders

## 2011-08-29 NOTE — Telephone Encounter (Signed)
Saw Dr Alwyn Ren

## 2011-08-29 NOTE — Telephone Encounter (Signed)
Please check on patient. Feeling  Better? Ambulatory BPs? Also, check medication compliance, amlodipine was increased from 5 to 10 mg (not to 40 mg !) Office visit today or tomorrow if needed, overbook okay.

## 2011-08-29 NOTE — Telephone Encounter (Signed)
Pt called the office as advised by the nurse at after hour service. Pt states that she has not taken BP today but her BS was 110. Pt notes that she still does not feel like herself and would like to come in for OV. Pt indicated that she is not taking 40 mg she was out of it and could not think clearly when she was talking with nurse. Pt schedule to come in for OV today.

## 2011-08-29 NOTE — Progress Notes (Signed)
  Subjective:    Patient ID: Samantha [name], female    DOB: [birth year], 71 y.o.   MRN: [id number]  HPI She began to have nausea 08/23/11; this did respond to protein pump inhibitors. She was forcing hydration because of her recent urinary tract infection. She noted profuse sweating.  She also had a sensation of lightheadedness and dizziness with above symptoms .  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 She did not treat this with any specific therapy.  She had extensive lab studies done on 08/14/11. Glucose was minimally elevated at 102; A1c was 6.7%, indicating good diabetic control. RDW was 14.8, but there was no anemia present. Platelet count was minimally reduced at 149,000. White count was minimally reduced at 4200 but differential was normal. Chemistries and electrolytes were otherwise excellent as were her liver function tests      Review of Systems The dizziness was not associated or related to straining or any pain. She had no cardiac prodrome such as a change in rhythm or rate prior to the dizziness. She  denies chest pain but has had a "malady" described as a dull pain 7/27.  She did have a right posterior lateral headache  7/25 which was self-limited. She denies associated numbness or tingling or limb weakness. There's been no syncope or seizure activity. She also denies any hearing loss or tinnitus.    As noted she's had sweats but no chills or fever. She denies dysuria, pyuria, or hematuria.     Objective:   Physical Exam  Gen. appearance: Well-nourished, in no distress Eyes: Extraocular motion intact, field of vision normal, vision grossly intact, no nystagmus. Arcus  ENT: Canals clear, tympanic membranes normal, tuning fork exam normal, hearing grossly normal Neck: Decreased range of motion,no masses, normal thyroid Cardiovascular: Rate and rhythm normal; no murmurs, gallops or extra heart sounds. S 4 Muscle skeletal: Tone &   strength normal; mild DJD finger changes Neuro:no cranial nerve deficit, deep tendon  reflexes normal, gait slow & deliberate; finger to nose & Rhomberg neg Lymph: No cervical or axillary LA Skin: Warm and dry without suspicious lesions or rashes Psych: no anxiety or mood change. Normally interactive and cooperative.         Assessment & Plan:  #1 lightheadedness/dizziness  #2 diaphoresis; she has been forcing hydration. Rule out electrolyte imbalance  #3 ocular migraine  #4 chest pain ,atypical. In the context of diaphoresis; cardiac process will need to be ruled out  Plan: See orders and recommendations

## 2011-08-29 NOTE — Telephone Encounter (Signed)
[facility] Triage Call Report Triage Record Num: [id number] Operator: Rebeca Allegra Patient Name: Samantha [name] Call Date & Time: 08/28/2011 5:19:43PM Patient Phone: [phone] PCP: Nolon Rod. Paz Patient Gender: Female PCP Fax : Patient DOB: September 17, 1940 Practice Name:  [facility] Reason for Call: Caller: [name]; PCP: Willow Ora; CB#: [phone]; Call regarding BP; pt seen in the office 08/14/11. Amlodipine dose increased from 10mg  to 40mg s. Pt reports feeling nauseated and lightheaded; onset 08/26/11. 08/28/11 1430 111/67 HR68. All emergent symptoms ruled out per Hypertension Diagnosed or Suspected protocol with the "Sudden drop in blood pressure and recent change in prescription, nonprescription, or alternative medications or their dosage". Instructed pt to call office 08/29/11 when open to f/u with Dr. Drue Novel. Protocol(s) Used: Hypertension, Diagnosed or Suspected Recommended Outcome per Protocol: Call Provider within 8 Hours Reason for Outcome: Sudden drop in blood pressure (41mm/Hg or more than usual reading) AND recent change in prescription, nonprescription, or alternative medications or their dosages. Care Advice: Call EMS 911 if new symptoms develop, such as loss of consciousness, severe shortness of breath, chest pain, sudden change in mental status, pulse rate > 120 / minute, or very irregular pulse or severe blood loss.

## 2011-08-30 ENCOUNTER — Encounter: Payer: Self-pay | Admitting: *Deleted

## 2011-08-30 ENCOUNTER — Telehealth: Payer: Self-pay | Admitting: *Deleted

## 2011-08-30 NOTE — Telephone Encounter (Signed)
[facility] Triage Call Report Triage Record Num: [id number] Operator: Tomasita Crumble Patient Name: Samantha [name] Call Date & Time: 08/29/2011 6:57:39PM Patient Phone: [phone] PCP: Nolon Rod. Paz Patient Gender: Female PCP Fax : Patient DOB: 02/25/1940 Practice Name: [facility] Reason for Call: Caller: Zella Ball; PCP: Willow Ora; CB#: [phone]; Call regarding Zella Ball is calling from [place] regarding a CKMB ordered on Syanne, [place] by Willow Ora.; Result: CKMB Alert High at 11.1 (verified this is not critical), Range 0.3 -4.0; Troponin <0.01, "so no indication of myocardial injury". Information noted and sent to office for follow up per PCP Calls, No Triage protocol. Protocol(s) Used: PCP Calls, No Triage (Adult) Recommended Outcome per Protocol: Call Provider within 24 Hours Reason for Outcome: Lab calling with test results Care Advice: ~ 07

## 2011-08-31 ENCOUNTER — Telehealth: Payer: Self-pay

## 2011-08-31 DIAGNOSIS — T887XXA Unspecified adverse effect of drug or medicament, initial encounter: Secondary | ICD-10-CM

## 2011-08-31 DIAGNOSIS — R7989 Other specified abnormal findings of blood chemistry: Secondary | ICD-10-CM

## 2011-08-31 DIAGNOSIS — E785 Hyperlipidemia, unspecified: Secondary | ICD-10-CM

## 2011-08-31 NOTE — Telephone Encounter (Signed)
Message copied by Maurice Small on Fri Aug 31, 2011  9:49 AM ------      Message from: Pecola Lawless      Created: Thu Aug 30, 2011 10:53 AM       BUN, creatinine, and GFR  all assess kidney function.Mild impairment of kidney function has developed since 7/16 probably due to excess sweating.  You should  stay well hydrated. Drink to thirst, up to 32 ounces of fluids per day.  This should be less than  1/2 water & over 1/2  other fluids such as Gatorade Lite, tea ,etc.To protect the kidneys it  Is also important to control your blood pressure and sugar.      CK is an enzyme released with any  muscle injury or overuse; CK-MB is the  Specific component from the heart muscle. Troponin 1 is a specific marker for cardiac injury also. Your CK & MB  Elevations  without rise in  Troponin 1 suggest that a muscle issue , not a cardiac process is present.Please  STOP Pravastatin 40 mg for 2 weeks & stay hydrated as above & recheck CK, CK -MB , BUN, creatinine in 2 weeks before restarting Pravastatin. PLEASE BRING THESE INSTRUCTIONS TO FOLLOW UP  LAB APPOINTMENT.This will guarantee correct labs are drawn, eliminating need for repeat blood sampling ( needle sticks ! ). Diagnoses /Codes: 995.20, 272.4, 790.6.

## 2011-08-31 NOTE — Telephone Encounter (Signed)
Spoke with patient, patient verbalized understanding of labs. Copy of labs mailed to patient.

## 2011-09-05 ENCOUNTER — Other Ambulatory Visit: Payer: Self-pay | Admitting: Internal Medicine

## 2011-09-05 DIAGNOSIS — M25569 Pain in unspecified knee: Secondary | ICD-10-CM | POA: Diagnosis not present

## 2011-09-05 DIAGNOSIS — M719 Bursopathy, unspecified: Secondary | ICD-10-CM | POA: Diagnosis not present

## 2011-09-05 DIAGNOSIS — M159 Polyosteoarthritis, unspecified: Secondary | ICD-10-CM | POA: Diagnosis not present

## 2011-09-05 DIAGNOSIS — R6889 Other general symptoms and signs: Secondary | ICD-10-CM | POA: Diagnosis not present

## 2011-09-05 DIAGNOSIS — M67919 Unspecified disorder of synovium and tendon, unspecified shoulder: Secondary | ICD-10-CM | POA: Diagnosis not present

## 2011-09-05 NOTE — Telephone Encounter (Signed)
Refill done.  

## 2011-09-07 ENCOUNTER — Encounter: Payer: Self-pay | Admitting: Family Medicine

## 2011-09-07 ENCOUNTER — Ambulatory Visit (INDEPENDENT_AMBULATORY_CARE_PROVIDER_SITE_OTHER): Payer: Medicare Other | Admitting: Family Medicine

## 2011-09-07 ENCOUNTER — Ambulatory Visit: Payer: Medicare Other | Admitting: Internal Medicine

## 2011-09-07 VITALS — BP 115/70 | HR 79 | Temp 98.6°F | Ht 63.0 in | Wt 191.6 lb

## 2011-09-07 DIAGNOSIS — J019 Acute sinusitis, unspecified: Secondary | ICD-10-CM | POA: Diagnosis not present

## 2011-09-07 MED ORDER — BENZONATATE 200 MG PO CAPS: 200.0000 mg | ORAL_CAPSULE | Freq: Three times a day (TID) | ORAL | Status: AC | PRN

## 2011-09-07 MED ORDER — AMOXICILLIN 875 MG PO TABS: 875.0000 mg | ORAL_TABLET | Freq: Two times a day (BID) | ORAL | Status: AC

## 2011-09-07 NOTE — Patient Instructions (Addendum)
Start the Amoxicillin twice daily w/ food for your sinus infection Drink plenty of fluids! Use the tessalon as needed for cough Add Mucinex to thin your congestion REST! Hang in there!

## 2011-09-07 NOTE — Progress Notes (Signed)
  Subjective:    Patient ID: Samantha [name], female    DOB: 1940-12-28, 71 y.o.   MRN: [id number]  HPI URI- 'i've got a sinus infxn'.  sxs started 2-3 weeks ago. + PND, sore throat, productive cough, bloody nasal drainage, facial pressure.  No tooth pain.  No ear pain- fullness.  No fevers.  + sick contacts.  + hoarseness   Review of Systems For ROS see HPI     Objective:   Physical Exam  Vitals reviewed. Constitutional: She appears well-developed and well-nourished. No distress.  HENT:  Head: Normocephalic and atraumatic.  Right Ear: Tympanic membrane normal.  Left Ear: Tympanic membrane normal.  Nose: Mucosal edema and rhinorrhea present. Right sinus exhibits maxillary sinus tenderness and frontal sinus tenderness. Left sinus exhibits maxillary sinus tenderness and frontal sinus tenderness.  Mouth/Throat: Uvula is midline and mucous membranes are normal. Posterior oropharyngeal erythema present. No oropharyngeal exudate.  Eyes: Conjunctivae and EOM are normal. Pupils are equal, round, and reactive to light.  Neck: Normal range of motion. Neck supple.  Cardiovascular: Normal rate, regular rhythm and normal heart sounds.   Pulmonary/Chest: Effort normal and breath sounds normal. No respiratory distress. She has no wheezes.  Lymphadenopathy:    She has no cervical adenopathy.          Assessment & Plan:

## 2011-09-07 NOTE — Assessment & Plan Note (Signed)
New.  Pt's sxs and PE consistent w/ infxn.  Start abx.  Pt reports she has taken amox previously despite cephalosporin allergy.  Reviewed supportive care and red flags that should prompt return.  Pt expressed understanding and is in agreement w/ plan.

## 2011-09-14 ENCOUNTER — Other Ambulatory Visit (INDEPENDENT_AMBULATORY_CARE_PROVIDER_SITE_OTHER): Payer: Medicare Other

## 2011-09-14 DIAGNOSIS — E785 Hyperlipidemia, unspecified: Secondary | ICD-10-CM | POA: Diagnosis not present

## 2011-09-14 DIAGNOSIS — R7989 Other specified abnormal findings of blood chemistry: Secondary | ICD-10-CM | POA: Diagnosis not present

## 2011-09-14 DIAGNOSIS — T887XXA Unspecified adverse effect of drug or medicament, initial encounter: Secondary | ICD-10-CM | POA: Diagnosis not present

## 2011-09-14 LAB — BUN: BUN: 20 mg/dL (ref 6–23)

## 2011-09-14 LAB — CREATININE, SERUM: Creatinine, Ser: 0.9 mg/dL (ref 0.4–1.2)

## 2011-10-18 ENCOUNTER — Encounter: Payer: Self-pay | Admitting: Gastroenterology

## 2011-10-22 ENCOUNTER — Encounter: Payer: Self-pay | Admitting: Gastroenterology

## 2011-10-24 DIAGNOSIS — Z124 Encounter for screening for malignant neoplasm of cervix: Secondary | ICD-10-CM | POA: Diagnosis not present

## 2011-10-24 DIAGNOSIS — Z13820 Encounter for screening for osteoporosis: Secondary | ICD-10-CM | POA: Diagnosis not present

## 2011-10-24 DIAGNOSIS — Z1231 Encounter for screening mammogram for malignant neoplasm of breast: Secondary | ICD-10-CM | POA: Diagnosis not present

## 2011-11-01 ENCOUNTER — Telehealth: Payer: Self-pay

## 2011-11-01 NOTE — Telephone Encounter (Signed)
Advise pt:  Kidney function has improved significantly. The muscle enzyme continues to rise despite being off the pravastatin for 2 weeks. The pravastatin shouldn't be restarted until you are reevaluated by Dr. Drue Novel. An evaluation by rheumatologist may be necessary to evaluate the elevated muscle enzyme values.   Pt states understanding.      MW

## 2011-11-05 ENCOUNTER — Telehealth: Payer: Self-pay

## 2011-11-05 NOTE — Telephone Encounter (Signed)
I called Samantha [name] to advise labs weren't faxed having problem faxing her labs to Dr. Kellie Simmering. Samantha [name] stated she is coming to pick them up.        MW

## 2011-11-05 NOTE — Telephone Encounter (Signed)
Pt requesting last 2 labs faxed to Dr. Kellie Simmering for procedure he is doing in morning. Advised pt I would do that for her. Also mailing a copy of them to pt.    MW

## 2011-11-06 DIAGNOSIS — Z79899 Other long term (current) drug therapy: Secondary | ICD-10-CM | POA: Diagnosis not present

## 2011-11-06 DIAGNOSIS — N390 Urinary tract infection, site not specified: Secondary | ICD-10-CM | POA: Diagnosis not present

## 2011-11-06 DIAGNOSIS — M6530 Trigger finger, unspecified finger: Secondary | ICD-10-CM | POA: Diagnosis not present

## 2011-11-06 DIAGNOSIS — R6889 Other general symptoms and signs: Secondary | ICD-10-CM | POA: Diagnosis not present

## 2011-11-06 DIAGNOSIS — M2550 Pain in unspecified joint: Secondary | ICD-10-CM | POA: Diagnosis not present

## 2011-11-26 DIAGNOSIS — M6530 Trigger finger, unspecified finger: Secondary | ICD-10-CM | POA: Diagnosis not present

## 2011-11-26 DIAGNOSIS — M719 Bursopathy, unspecified: Secondary | ICD-10-CM | POA: Diagnosis not present

## 2011-11-26 DIAGNOSIS — M25579 Pain in unspecified ankle and joints of unspecified foot: Secondary | ICD-10-CM | POA: Diagnosis not present

## 2011-11-26 DIAGNOSIS — R6889 Other general symptoms and signs: Secondary | ICD-10-CM | POA: Diagnosis not present

## 2011-11-26 DIAGNOSIS — M67919 Unspecified disorder of synovium and tendon, unspecified shoulder: Secondary | ICD-10-CM | POA: Diagnosis not present

## 2011-12-14 ENCOUNTER — Other Ambulatory Visit: Payer: Self-pay | Admitting: *Deleted

## 2011-12-14 NOTE — Telephone Encounter (Signed)
Error Debbie Kesi Perrow RN  

## 2012-01-01 ENCOUNTER — Ambulatory Visit (INDEPENDENT_AMBULATORY_CARE_PROVIDER_SITE_OTHER): Payer: Medicare Other | Admitting: Internal Medicine

## 2012-01-01 ENCOUNTER — Encounter: Payer: Self-pay | Admitting: Internal Medicine

## 2012-01-01 VITALS — BP 118/74 | HR 64 | Temp 98.0°F | Wt 194.0 lb

## 2012-01-01 DIAGNOSIS — I10 Essential (primary) hypertension: Secondary | ICD-10-CM | POA: Diagnosis not present

## 2012-01-01 DIAGNOSIS — J45909 Unspecified asthma, uncomplicated: Secondary | ICD-10-CM | POA: Diagnosis not present

## 2012-01-01 DIAGNOSIS — Z23 Encounter for immunization: Secondary | ICD-10-CM

## 2012-01-01 DIAGNOSIS — E119 Type 2 diabetes mellitus without complications: Secondary | ICD-10-CM

## 2012-01-01 DIAGNOSIS — N390 Urinary tract infection, site not specified: Secondary | ICD-10-CM | POA: Diagnosis not present

## 2012-01-01 DIAGNOSIS — Z0000 Encounter for general adult medical examination without abnormal findings: Secondary | ICD-10-CM

## 2012-01-01 LAB — POCT URINALYSIS DIPSTICK
Blood, UA: NEGATIVE
Glucose, UA: NEGATIVE
Nitrite, UA: NEGATIVE
Urobilinogen, UA: 0.2

## 2012-01-01 MED ORDER — HYDROCHLOROTHIAZIDE 25 MG PO TABS: 12.5000 mg | ORAL_TABLET | Freq: Every day | ORAL | Status: DC

## 2012-01-01 NOTE — Progress Notes (Signed)
  Subjective:    Patient ID: Samantha [name], female    DOB: 1940-05-05, 71 y.o.   MRN: [id number]  HPI Routine visit Fibromyalgia and  increased CK, saw Dr. Kellie Simmering 10- 2013, labs were drawn. No specific therapy was recommended. UTI, urinalysis was rechecked by Dr.Truslow 10-2011, patient reports she was treated again with antibiotics. Diabetes, good medication compliance High cholesterol, good medication compliance, for a while she was off statins but  that did not help her usual aches and pains. Reactive airway disease, hardly ever uses albuterol.  Past Medical History: DM II Hyperlipidemia Hypertension Fibromyalgia   OSA dx w/ a  sleep study done  09/2007, not using a CPAP, weight loss Reactive airway dz ------dx of pseudoasthma / vcd in 2005 and nl sprirometry ------History of dyspnea, 2011,  improved after several medications were changed around ------Question of COPD, disproved July 06, 2009 with nl pft's   atypical CP -------- Nov 2008: saw cards: neg/normal Holter, ECHO and Myoview --------Admited w/ CP 4-09: neg enz. and CT chest. Had a 9 beat NSVT abnormal CT of the chest in 2008, follow-up CT , April 2009: Unchanged.  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 Thyroidectomy, partial     in the 60s  . Cervical laminectomy 2001    in [place]  . Abdominal hysterectomy   . Foot surgery   . Breast biopsy 1999  . Cervical fusion   . Abdominal hysterectomy    Family History  Problem Relation Age of Onset  . Allergies Sister   . Asthma Sister   . Asthma Paternal Grandmother   . Heart disease Father   . Heart disease Mother   . Heart disease      paternal grandparents, maternal grandparents,   . Heart disease Brother   . Lung cancer Mother   . Breast cancer Neg Hx   . Colon cancer Neg Hx    History   Social History  . Marital  Status: Married    Spouse Name: N/A    Number of Children: 2  . Years of Education: N/A   Occupational History  . Retired    Social History Main Topics  . Smoking status: Former Games developer  . Smokeless tobacco: Never Used  . Alcohol Use: No  . Drug Use: No  . Sexually Active: Not on file   Other Topics Concern  . Not on file   Social History Narrative   On disability since 2000--- also husband has MS   Review of Systems No chest pain or shortness of breath Denies nausea, vomiting, diarrhea. No dysuria or gross hematuria Occasional burning of her feet, mostly at the dorsum    Objective:   Physical Exam General -- alert, well-developed, and well-nourished.   Lungs -- normal respiratory effort, no intercostal retractions, no accessory muscle use, and normal breath sounds.   Heart-- normal rate, regular rhythm, no murmur, and no gallop.   DIABETIC FEET EXAM: No lower extremity edema Normal pedal pulses bilaterally Skin and nails but has some calluses Pinprick examination of the feet: Patchy and subtle decreased sensitivity distally  Psych-- Cognition and judgment appear intact. Alert and cooperative with normal attention span and concentration.  not anxious appearing and not depressed appearing.       Assessment & Plan:

## 2012-01-01 NOTE — Assessment & Plan Note (Signed)
Had sa flu shot, sx well controlled

## 2012-01-01 NOTE — Assessment & Plan Note (Signed)
Flu shot provided today She is due for a colonoscopy, reports that she plans to have a colonoscopy in the future, currently very busy with her husband who has multiple medical problems

## 2012-01-01 NOTE — Patient Instructions (Addendum)
See the eye doctor at least yearly Please rate the feet care recommendations carefuly Next visit in 4 months

## 2012-01-01 NOTE — Assessment & Plan Note (Signed)
Good compliance with meds, check A1c. Reports she is due for a eye exam, recommend to have an eye exam yearly. Feet exam today -- mild  neuropathy, feet care discussed

## 2012-01-01 NOTE — Assessment & Plan Note (Addendum)
Seems well-controlled, BMP drawn at rheumatology few weeks ago, will call for esults

## 2012-01-01 NOTE — Assessment & Plan Note (Signed)
Reports a urinalysis at rheumatology 10-13showed infection, recheck a UA

## 2012-01-04 ENCOUNTER — Other Ambulatory Visit: Payer: Self-pay | Admitting: Internal Medicine

## 2012-01-04 NOTE — Telephone Encounter (Signed)
done

## 2012-01-04 NOTE — Telephone Encounter (Signed)
Ok to refill 

## 2012-01-07 ENCOUNTER — Encounter: Payer: Self-pay | Admitting: *Deleted

## 2012-01-08 ENCOUNTER — Other Ambulatory Visit: Payer: Self-pay | Admitting: *Deleted

## 2012-01-08 MED ORDER — NEBIVOLOL HCL 20 MG PO TABS: 20.0000 mg | ORAL_TABLET | Freq: Every day | ORAL | Status: DC

## 2012-01-15 ENCOUNTER — Telehealth: Payer: Self-pay | Admitting: Internal Medicine

## 2012-01-15 NOTE — Telephone Encounter (Signed)
Pt has been experiencing vomiting & diarrhea. Please advise.

## 2012-01-15 NOTE — Telephone Encounter (Signed)
Please check on her in the morning, let me know how she is doing

## 2012-01-15 NOTE — Telephone Encounter (Signed)
Patient Information:  Caller Name: Makeisha  Phone: [phone]  Patient: Samantha [name], Samantha [name]  Gender: Female  DOB: 1940-06-22  Age: 71 Years  PCP: Willow Ora  Office Follow Up:  Does the office need to follow up with this patient?: No  Instructions For The Office: N/A  RN Note:  Vomiting and diarrhea since 0500 01/15/12.  Afebrile.  Denies emergent symptoms per protocol; advised for home care for now, with callback parameters given.  krs/can  Symptoms  Reason For Call & Symptoms: vomiting and diarrhea  Reviewed Health History In EMR: Yes  Reviewed Medications In EMR: Yes  Reviewed Allergies In EMR: Yes  Reviewed Surgeries / Procedures: Yes  Date of Onset of Symptoms: 01/15/2012  Guideline(s) Used:  Vomiting  Disposition Per Guideline:   Home Care  Reason For Disposition Reached:   Vomiting with diarrhea  Advice Given:  N/A

## 2012-01-16 NOTE — Telephone Encounter (Signed)
Spoke with pt, she states she is feeling a littler better. She is not longer vomiting or having diarrhea. Pt states she is getting plenty of rest & drinking lots of fluids.

## 2012-01-16 NOTE — Telephone Encounter (Signed)
thx

## 2012-02-01 ENCOUNTER — Encounter: Payer: Self-pay | Admitting: Internal Medicine

## 2012-02-01 ENCOUNTER — Ambulatory Visit (INDEPENDENT_AMBULATORY_CARE_PROVIDER_SITE_OTHER): Payer: Medicare Other | Admitting: Internal Medicine

## 2012-02-01 VITALS — BP 128/78 | HR 56 | Temp 97.9°F | Wt 195.0 lb

## 2012-02-01 DIAGNOSIS — R609 Edema, unspecified: Secondary | ICD-10-CM | POA: Insufficient documentation

## 2012-02-01 MED ORDER — FUROSEMIDE 20 MG PO TABS: 40.0000 mg | ORAL_TABLET | Freq: Two times a day (BID) | ORAL | Status: DC

## 2012-02-01 NOTE — Patient Instructions (Addendum)
For the next 10 days: stop hydrochlorothiazide. Instead take furosemide 20 mg 2 tablets every morning. Continue doing your low-salt diet. Elevated her legs 1 hour every morning and 1 hour every afternoon. After 10 days go back to  hydrochlorothiazide and  stop furosemide --- Call if the leg swelling continue

## 2012-02-01 NOTE — Progress Notes (Signed)
  Subjective:    Patient ID: Samantha [name], female    DOB: 01-24-1941, 72 y.o.   MRN: [id number]  HPI Acute visit, chief complaint and bilateral ankle edema. About 2 weeks ago, she developed nausea, vomiting, diarrhea. She drank plenty of gatorade, home made soup, Pepto-Bismol and eventually got better. Shortly after the GI symptoms resolved, she noted bilateral ankle edema, much worse at the end of the day. Taking no new medications.  Past Medical History:  DM II  Hyperlipidemia  Hypertension  Fibromyalgia  OSA dx w/ a sleep study done 09/2007, not using a CPAP, weight loss  Reactive airway dz  ------dx of pseudoasthma / vcd in 2005 and nl sprirometry  ------History of dyspnea, 2011, improved after several medications were changed around  ------Question of COPD, disproved July 06, 2009 with nl pft's  atypical CP  -------- Nov 2008: saw cards: neg/normal Holter, ECHO and Myoview  --------Admited w/ CP 4-09: neg enz. and CT chest. Had a 9 beat NSVT  abnormal CT of the chest in 2008, follow-up CT , April 2009: Unchanged. 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 Thyroidectomy, partial     in the 60s  . Cervical laminectomy 2001    in [place]  . Abdominal hysterectomy   . Foot surgery   . Breast biopsy 1999  . Cervical fusion   . Abdominal hysterectomy    History   Social History  . Marital Status: Married    Spouse Name: N/A    Number of Children: 2  . Years of Education: N/A   Occupational History  . Retired    Social History Main Topics  . Smoking status: Former Games developer  . Smokeless tobacco: Never Used  . Alcohol Use: No  . Drug Use: No  . Sexually Active: Not on file   Other Topics Concern  . Not on file   Social History Narrative   On disability since 2000--- also husband has MS     Review of Systems Denies chest pain or  shortness of breath. No paroxysmal nocturnal dyspnea, uses 2 pillows to sleep as usual. No cough.     Objective:   Physical Exam General -- alert, well-developed  Neck --no  JVD at 45 Lungs -- normal respiratory effort, no intercostal retractions, no accessory muscle use, and normal breath sounds.   Heart-- normal rate, regular rhythm, no murmur, and no gallop.   Extremities-- +/+++ peri-ankle edema B, trace pretibial edema B  Psych-- Cognition and judgment appear intact. Alert and cooperative with normal attention span and concentration.  not anxious appearing and not depressed appearing.        Assessment & Plan:

## 2012-02-01 NOTE — Assessment & Plan Note (Signed)
Edema as described above likely related to increased salt intake during recent stomach virus. She is on chronic amlodipine and reports no previous problems with persistent lower extremity edema Plan: To take Lasix temporarily instead of hydrochlorothiazide, low-salt diet, elevate legs. If not better let me know. See instructions

## 2012-02-07 DIAGNOSIS — E119 Type 2 diabetes mellitus without complications: Secondary | ICD-10-CM | POA: Diagnosis not present

## 2012-02-07 DIAGNOSIS — Z961 Presence of intraocular lens: Secondary | ICD-10-CM | POA: Diagnosis not present

## 2012-02-07 LAB — HM DIABETES EYE EXAM

## 2012-02-08 ENCOUNTER — Encounter: Payer: Self-pay | Admitting: *Deleted

## 2012-02-11 ENCOUNTER — Other Ambulatory Visit: Payer: Self-pay | Admitting: *Deleted

## 2012-02-11 MED ORDER — LISINOPRIL 40 MG PO TABS: 40.0000 mg | ORAL_TABLET | Freq: Every day | ORAL | Status: DC

## 2012-02-13 ENCOUNTER — Other Ambulatory Visit: Payer: Self-pay | Admitting: Internal Medicine

## 2012-02-13 NOTE — Telephone Encounter (Signed)
i do not see med on pt's active med list. Ok to refill?

## 2012-02-13 NOTE — Telephone Encounter (Signed)
Refill denied 

## 2012-02-13 NOTE — Telephone Encounter (Signed)
Agree , no RF

## 2012-02-14 ENCOUNTER — Other Ambulatory Visit: Payer: Self-pay | Admitting: Internal Medicine

## 2012-02-14 NOTE — Telephone Encounter (Signed)
Duplicate refill request: denied.   Samantha [name], [place] 02/13/2012 11:44 AM Signed  Refill denied.  Samantha Ora, MD 02/13/2012 11:07 AM Signed  Agree , no Samantha [name], [place] 02/13/2012 10:33 AM Signed  i do not see med on pt's active med list. Ok to refill?

## 2012-02-15 ENCOUNTER — Other Ambulatory Visit: Payer: Self-pay | Admitting: Internal Medicine

## 2012-02-15 NOTE — Telephone Encounter (Signed)
Per previous refill request, Dr. Drue Novel denied the refill request.

## 2012-02-24 ENCOUNTER — Other Ambulatory Visit: Payer: Self-pay | Admitting: Internal Medicine

## 2012-02-25 NOTE — Telephone Encounter (Signed)
Per previous refill requests. Dr. Drue Novel denied the refill.

## 2012-03-03 ENCOUNTER — Encounter (HOSPITAL_COMMUNITY): Payer: Self-pay

## 2012-03-03 ENCOUNTER — Emergency Department (HOSPITAL_COMMUNITY)
Admission: EM | Admit: 2012-03-03 | Discharge: 2012-03-04 | Disposition: A | Discharge status: Home / Self Care | Payer: Medicare Other | Attending: Emergency Medicine | Admitting: Emergency Medicine

## 2012-03-03 ENCOUNTER — Telehealth: Payer: Self-pay | Admitting: Internal Medicine

## 2012-03-03 DIAGNOSIS — Z8742 Personal history of other diseases of the female genital tract: Secondary | ICD-10-CM | POA: Diagnosis not present

## 2012-03-03 DIAGNOSIS — J45909 Unspecified asthma, uncomplicated: Secondary | ICD-10-CM | POA: Diagnosis not present

## 2012-03-03 DIAGNOSIS — Z87891 Personal history of nicotine dependence: Secondary | ICD-10-CM | POA: Diagnosis not present

## 2012-03-03 DIAGNOSIS — E785 Hyperlipidemia, unspecified: Secondary | ICD-10-CM | POA: Diagnosis not present

## 2012-03-03 DIAGNOSIS — R197 Diarrhea, unspecified: Secondary | ICD-10-CM | POA: Insufficient documentation

## 2012-03-03 DIAGNOSIS — IMO0001 Reserved for inherently not codable concepts without codable children: Secondary | ICD-10-CM | POA: Diagnosis not present

## 2012-03-03 DIAGNOSIS — Z8619 Personal history of other infectious and parasitic diseases: Secondary | ICD-10-CM | POA: Diagnosis not present

## 2012-03-03 DIAGNOSIS — E119 Type 2 diabetes mellitus without complications: Secondary | ICD-10-CM | POA: Insufficient documentation

## 2012-03-03 DIAGNOSIS — G4733 Obstructive sleep apnea (adult) (pediatric): Secondary | ICD-10-CM | POA: Insufficient documentation

## 2012-03-03 DIAGNOSIS — Z79899 Other long term (current) drug therapy: Secondary | ICD-10-CM | POA: Insufficient documentation

## 2012-03-03 DIAGNOSIS — Z8739 Personal history of other diseases of the musculoskeletal system and connective tissue: Secondary | ICD-10-CM | POA: Diagnosis not present

## 2012-03-03 DIAGNOSIS — Z8701 Personal history of pneumonia (recurrent): Secondary | ICD-10-CM | POA: Insufficient documentation

## 2012-03-03 DIAGNOSIS — R112 Nausea with vomiting, unspecified: Secondary | ICD-10-CM

## 2012-03-03 DIAGNOSIS — I10 Essential (primary) hypertension: Secondary | ICD-10-CM | POA: Diagnosis not present

## 2012-03-03 DIAGNOSIS — M791 Myalgia, unspecified site: Secondary | ICD-10-CM

## 2012-03-03 DIAGNOSIS — Z7982 Long term (current) use of aspirin: Secondary | ICD-10-CM | POA: Insufficient documentation

## 2012-03-03 LAB — CBC WITH DIFFERENTIAL/PLATELET
Basophils Absolute: 0 10*3/uL (ref 0.0–0.1)
Basophils Relative: 0 % (ref 0–1)
Eosinophils Absolute: 0 10*3/uL (ref 0.0–0.7)
Eosinophils Relative: 1 % (ref 0–5)
HCT: 37.9 % (ref 36.0–46.0)
Hemoglobin: 12.8 g/dL (ref 12.0–15.0)
Lymphocytes Relative: 13 % (ref 12–46)
Lymphs Abs: 0.6 10*3/uL — ABNORMAL LOW (ref 0.7–4.0)
MCH: 28.1 pg (ref 26.0–34.0)
MCHC: 33.8 g/dL (ref 30.0–36.0)
MCV: 83.3 fL (ref 78.0–100.0)
Monocytes Absolute: 0.1 10*3/uL (ref 0.1–1.0)
Monocytes Relative: 3 % (ref 3–12)
Neutro Abs: 3.5 10*3/uL (ref 1.7–7.7)
Neutrophils Relative %: 83 % — ABNORMAL HIGH (ref 43–77)
Platelets: 132 10*3/uL — ABNORMAL LOW (ref 150–400)
RBC: 4.55 MIL/uL (ref 3.87–5.11)
RDW: 14.6 % (ref 11.5–15.5)
WBC: 4.2 10*3/uL (ref 4.0–10.5)

## 2012-03-03 LAB — COMPREHENSIVE METABOLIC PANEL
ALT: 15 U/L (ref 0–35)
AST: 17 U/L (ref 0–37)
Albumin: 3.7 g/dL (ref 3.5–5.2)
Alkaline Phosphatase: 51 U/L (ref 39–117)
BUN: 12 mg/dL (ref 6–23)
CO2: 22 mEq/L (ref 19–32)
Calcium: 8.6 mg/dL (ref 8.4–10.5)
Chloride: 105 mEq/L (ref 96–112)
Creatinine, Ser: 0.75 mg/dL (ref 0.50–1.10)
GFR calc Af Amer: >90 mL/min (ref >90)
GFR calc non Af Amer: 84 mL/min — ABNORMAL LOW (ref >90)
Glucose, Bld: 107 mg/dL — ABNORMAL HIGH (ref 70–99)
Potassium: 3.7 mEq/L (ref 3.5–5.1)
Sodium: 137 mEq/L (ref 135–145)
Total Bilirubin: 0.6 mg/dL (ref 0.3–1.2)
Total Protein: 7 g/dL (ref 6.0–8.3)

## 2012-03-03 LAB — URINALYSIS, ROUTINE W REFLEX MICROSCOPIC
Bilirubin Urine: NEGATIVE
Glucose, UA: NEGATIVE mg/dL
Ketones, ur: NEGATIVE mg/dL
Nitrite: NEGATIVE
Protein, ur: NEGATIVE mg/dL
Specific Gravity, Urine: 1.025 (ref 1.005–1.030)
Urobilinogen, UA: 0.2 mg/dL (ref 0.0–1.0)
pH: 5 (ref 5.0–8.0)

## 2012-03-03 LAB — URINE MICROSCOPIC-ADD ON

## 2012-03-03 MED ORDER — KETOROLAC TROMETHAMINE 15 MG/ML IJ SOLN
15.0000 mg | Freq: Once | INTRAMUSCULAR | Status: AC
  Administered 2012-03-03: 15 mg via INTRAVENOUS
  Filled 2012-03-03: qty 1

## 2012-03-03 MED ORDER — ONDANSETRON 4 MG PO TBDP: 4.0000 mg | ORAL_TABLET | Freq: Three times a day (TID) | ORAL | Status: DC | PRN

## 2012-03-03 MED ORDER — SODIUM CHLORIDE 0.9 % IV BOLUS (SEPSIS)
1000.0000 mL | Freq: Once | INTRAVENOUS | Status: AC
  Administered 2012-03-03: 1000 mL via INTRAVENOUS

## 2012-03-03 MED ORDER — ONDANSETRON HCL 4 MG/2ML IJ SOLN
4.0000 mg | Freq: Once | INTRAMUSCULAR | Status: AC
  Administered 2012-03-03: 4 mg via INTRAVENOUS
  Filled 2012-03-03: qty 2

## 2012-03-03 NOTE — ED Notes (Signed)
Pt st's she started feeling bad yesterday then started having nausea, vomiting and diarrhea today.

## 2012-03-03 NOTE — ED Provider Notes (Signed)
History     CSN: [id number]  Arrival date & time 03/03/12  1842   First MD Initiated Contact with Patient 03/03/12 2103      Chief Complaint  Patient presents with  . Emesis  . Diarrhea    (Consider location/radiation/quality/duration/timing/severity/associated sxs/prior treatment) Patient is a 72 y.o. female presenting with vomiting and diarrhea. The history is provided by the patient.  Emesis  This is a new problem. The current episode started 12 to 24 hours ago. The problem occurs 2 to 4 times per day. The problem has been gradually improving. The emesis has an appearance of stomach contents. The maximum temperature recorded prior to her arrival was 100 to 100.9 F. The fever has been present for less than 1 day. Associated symptoms include chills, diarrhea and myalgias. Pertinent negatives include no abdominal pain, no arthralgias, no fever and no headaches. Risk factors include ill contacts.  Diarrhea The primary symptoms include vomiting, diarrhea and myalgias. Primary symptoms do not include fever, fatigue, abdominal pain, nausea, arthralgias or rash. The illness began yesterday. The onset was gradual. The problem has not changed since onset. The diarrhea began yesterday. The diarrhea is watery. The diarrhea occurs 5 to 10 times per day.  The myalgias are not associated with weakness.  The illness is also significant for chills. The illness does not include back pain.    Past Medical History  Diagnosis Date  . OSA (obstructive sleep apnea) 09/2007    dx w/ a sleep study, Rx CPAP, weight loss  . Hyperlipidemia   . Hypertension   . Chest pain, atypical 11/2006    saw cards: neg/normal Holter, ECHo and myoview, admitted w/ CP 4/09 neg enz.and CT chest had a 9 beat NSVT  . Fibromyalgia   . Abnormal CT of the chest 2008    follow up CT, April 2009: unchanged. No f/u suggested   . 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 Shingles 11/2009  . Diabetes mellitus   .  Asthma   . Pneumonia   . Vaginal dysplasia     Past Surgical History  Procedure Date  . Thyroidectomy, partial     in the 60s  . Cervical laminectomy 2001    in [place]  . Abdominal hysterectomy   . Foot surgery   . Breast biopsy 1999  . Cervical fusion   . Abdominal hysterectomy     Family History  Problem Relation Age of Onset  . Allergies Sister   . Asthma Sister   . Asthma Paternal Grandmother   . Heart disease Father   . Heart disease Mother   . Heart disease      paternal grandparents, maternal grandparents,   . Heart disease Brother   . Lung cancer Mother   . Breast cancer Neg Hx   . Colon cancer Neg Hx     History  Substance Use Topics  . Smoking status: Former Games developer  . Smokeless tobacco: Never Used  . Alcohol Use: No    OB History    Grav Para Term Preterm Abortions TAB SAB Ect Mult Living                  Review of Systems  Constitutional: Positive for chills. Negative for fever and fatigue.  HENT: Negative for congestion, rhinorrhea and postnasal drip.   Eyes: Negative for photophobia and visual disturbance.  Respiratory: Negative for chest tightness, shortness of breath and wheezing.   Cardiovascular: Negative for  chest pain, palpitations and leg swelling.  Gastrointestinal: Positive for vomiting and diarrhea. Negative for nausea and abdominal pain.  Genitourinary: Negative for urgency, frequency and difficulty urinating.  Musculoskeletal: Positive for myalgias. Negative for back pain and arthralgias.  Skin: Negative for rash and wound.  Neurological: Negative for weakness and headaches.  Psychiatric/Behavioral: Negative for confusion and agitation.    Allergies  Cefuroxime axetil; Oxycodone; Seldane; and Tramadol  Home Medications   Current Outpatient Rx  Name  Route  Sig  Dispense  Refill  . ALBUTEROL SULFATE HFA 108 (90 BASE) MCG/ACT IN AERS   Inhalation   Inhale 2 puffs into the lungs every 6 (six) hours as needed.   18 g   2    . AMLODIPINE BESYLATE 10 MG PO TABS   Oral   Take 1 tablet (10 mg total) by mouth daily.   90 tablet   3   . ASPIRIN 81 MG PO TABS   Oral   Take 81 mg by mouth daily.           [name] CALCIUM PO   Oral   Take 1 tablet by mouth daily.           [name] CETIRIZINE HCL 10 MG PO TABS   Oral   Take 10 mg by mouth daily.         . CYCLOBENZAPRINE HCL 5 MG PO TABS   Oral   Take 5 mg by mouth 3 (three) times daily as needed. For muscle relaxer         . FAMOTIDINE 20 MG PO TABS   Oral   Take 20 mg by mouth at bedtime as needed. For acid reflux         . FLUTICASONE PROPIONATE 50 MCG/ACT NA SUSP   Nasal   Place 2 sprays into the nose daily.   1 g   2   . GLUCOSAMINE-CHONDROITIN-MSM 500-200-150 MG PO TABS   Oral   Take 1 tablet by mouth every Monday, Wednesday, and Friday.          [name] HYDROCHLOROTHIAZIDE 25 MG PO TABS   Oral   Take 0.5 tablets (12.5 mg total) by mouth daily.   45 tablet   1   . LISINOPRIL 40 MG PO TABS   Oral   Take 1 tablet (40 mg total) by mouth daily.   30 tablet   11   . METFORMIN HCL ER 500 MG PO TB24      TAKE 1 TABLET BY MOUTH DAILY   30 tablet   6   . MULTIVITAMINS PO CAPS   Oral   Take 1 capsule by mouth daily.           . NEBIVOLOL HCL 20 MG PO TABS   Oral   Take 1 tablet (20 mg total) by mouth daily.   30 tablet   3     patient needs to schedule follow up appointment   . OMEPRAZOLE MAGNESIUM 20 MG PO TBEC   Oral   Take 20 mg by mouth daily. Take one 30-60 min before first meal of the day         . POTASSIUM 99 MG PO TABS   Oral   Take 99 mg by mouth daily.          [name] PRAVASTATIN SODIUM 40 MG PO TABS   Oral   Take 40 mg by mouth every evening.         [name] PREGABALIN  100 MG PO CAPS   Oral   Take 100 mg by mouth at bedtime as needed. For pain         . ONDANSETRON 4 MG PO TBDP   Oral   Take 1 tablet (4 mg total) by mouth every 8 (eight) hours as needed for nausea.   20 tablet   0     BP 125/79  Pulse  86  Temp 99.3 F (37.4 C) (Oral)  SpO2 98%  Physical Exam  Nursing note and vitals reviewed. Constitutional: She is oriented to person, place, and time. She appears well-developed and well-nourished. No distress.  HENT:  Head: Normocephalic and atraumatic.  Mouth/Throat: Oropharynx is clear and moist.  Eyes: EOM are normal. Pupils are equal, round, and reactive to light.  Neck: Normal range of motion. Neck supple.  Cardiovascular: Normal rate, regular rhythm, normal heart sounds and intact distal pulses.   Pulmonary/Chest: Effort normal and breath sounds normal. She has no wheezes. She has no rales.  Abdominal: Soft. Bowel sounds are normal. She exhibits no distension. There is no tenderness. There is no rebound and no guarding.  Musculoskeletal: Normal range of motion. She exhibits no edema and no tenderness.  Lymphadenopathy:    She has no cervical adenopathy.  Neurological: She is alert and oriented to person, place, and time. She displays normal reflexes. No cranial nerve deficit. She exhibits normal muscle tone. Coordination normal.  Skin: Skin is warm and dry. No rash noted.  Psychiatric: She has a normal mood and affect. Her behavior is normal.    ED Course  Procedures (including critical care time)  Labs Reviewed  CBC WITH DIFFERENTIAL - Abnormal; Notable for the following:    Platelets 132 (*)     Neutrophils Relative 83 (*)     Lymphs Abs 0.6 (*)     All other components within normal limits  COMPREHENSIVE METABOLIC PANEL - Abnormal; Notable for the following:    Glucose, Bld 107 (*)     GFR calc non Af Amer 84 (*)     All other components within normal limits  URINALYSIS, ROUTINE W REFLEX MICROSCOPIC - Abnormal; Notable for the following:    Hgb urine dipstick MODERATE (*)     Leukocytes, UA SMALL (*)     All other components within normal limits  URINE MICROSCOPIC-ADD ON - Abnormal; Notable for the following:    Bacteria, UA MANY (*)     All other components  within normal limits  URINE CULTURE   No results found.   1. Nausea vomiting and diarrhea   2. Myalgia       MDM  12F with pmhx of DM, HTN, HLD here with nausea, vomiting, diarrhea, and myalgias since yesterday. Tmax 100.7 yesterday. Was with grandson 2 days ago who had similar symptoms. Exam as noted above. Stable vitals. Normotensive. Appears well-hydrated. Abdomen soft and non-tender. Likely viral etiology. Doubt appendicitis, pyelonephritis, perforated viscous, gallbladder disease, or UTI. Labs with normal lytes and kidney function. Urine equivocal for UTI. Pt asymptomatic; will await urine cultures. Given 1L IVF, Zofran, and nsaid in ED and pt feeling better. She wants to go home and was already tolerating PO at home. Stable for d/c home. Return precautions given. Given a couple days of Zofran rx to go home with. F/u with pcp in 1-2 days if not better.        Johnnette Gourd, MD 03/03/12 [phone]

## 2012-03-03 NOTE — Telephone Encounter (Signed)
Needs to go to the ER now, please check on her tomorrow

## 2012-03-03 NOTE — Telephone Encounter (Signed)
Spoke to Pt who states that she is on her way out the door now she had to get her husband settled before she left.

## 2012-03-03 NOTE — Telephone Encounter (Addendum)
Please advise on note from call-a-nurse.//AB/CMA

## 2012-03-03 NOTE — Telephone Encounter (Signed)
Patient Information:  Caller Name: Shantera  Phone: [phone]  Patient: Samantha [name], Samantha [name]  Gender: Female  DOB: 08/25/1940  Age: 72 Years  PCP: Willow Ora  Office Follow Up:  Does the office need to follow up with this patient?: No  Instructions For The Office: N/A  RN Note:  Pt has passed urine with diarrhea episodes today - last time at 1200 noon. Very lightheaded with standing.  Mouth is slightly dry and increased thrist. Office doesn't do IV fluids.  RN advised pt to be seen at [facility] ED -  evaluation  for signs of dehydration and pt agreed.  Symptoms  Reason For Call & Symptoms: Today, 03/03/2012, at 0600 pt had onset vomiting  with 2 episodes - last time at 1200 noon. Also with 6 diarrhea stools. Has  been tolerating water, Gingerale, tea.  Had nausea that started on 03/01/2012 and had decreased intake. Was  able to  drink fluids through out the day.  Reviewed Health History In EMR: Yes  Reviewed Medications In EMR: Yes  Reviewed Allergies In EMR: Yes  Reviewed Surgeries / Procedures: Yes  Date of Onset of Symptoms: 03/02/2012  Treatments Tried: FLuids and rest  Treatments Tried Worked: Yes  Guideline(s) Used:  Vomiting  Disposition Per Guideline:   Go to ED Now (or to Office with PCP Approval)  Reason For Disposition Reached:   Drinking very little and has signs of dehydration (e.g., no urine > 12 hours, very dry mouth, very lightheaded)  Advice Given:  Clear Liquids:  Sip water or a rehydration drink (e.g., Gatorade or Powerade).

## 2012-03-03 NOTE — ED Notes (Signed)
Pt c/o N/V/D starting this am

## 2012-03-04 NOTE — Telephone Encounter (Signed)
Seen at the ER 

## 2012-03-05 ENCOUNTER — Telehealth: Payer: Self-pay | Admitting: *Deleted

## 2012-03-05 LAB — URINE CULTURE: Colony Count: >=100000

## 2012-03-05 MED ORDER — CIPROFLOXACIN HCL 500 MG PO TABS: 500.0000 mg | ORAL_TABLET | Freq: Two times a day (BID) | ORAL | Status: DC

## 2012-03-05 NOTE — Telephone Encounter (Signed)
Refill done.  

## 2012-03-06 NOTE — ED Notes (Signed)
+   Urine Patient treated with cipro by PCP.

## 2012-03-07 ENCOUNTER — Ambulatory Visit: Payer: Medicare Other | Admitting: Internal Medicine

## 2012-03-09 NOTE — ED Provider Notes (Signed)
I saw and evaluated the patient, reviewed the resident's note and I agree with the findings and plan.  71yF with v/d. Symptoms less than 24 hours. On exam. Nad. Alert. Skin warm/dry. Heart reg w/o murmur. Lungs clear. No increased wob. Abdomen soft and nt. No distension. Suspect viral illness. Low suspicion for emergent etiology. Plan continued symptomatic tx. outpt fu.   Raeford Razor, MD 03/09/12 [phone]

## 2012-03-20 DIAGNOSIS — M67919 Unspecified disorder of synovium and tendon, unspecified shoulder: Secondary | ICD-10-CM | POA: Diagnosis not present

## 2012-03-20 DIAGNOSIS — M6530 Trigger finger, unspecified finger: Secondary | ICD-10-CM | POA: Diagnosis not present

## 2012-03-26 ENCOUNTER — Other Ambulatory Visit: Payer: Medicare Other

## 2012-03-26 DIAGNOSIS — N390 Urinary tract infection, site not specified: Secondary | ICD-10-CM

## 2012-03-28 ENCOUNTER — Encounter: Payer: Self-pay | Admitting: Internal Medicine

## 2012-03-28 ENCOUNTER — Ambulatory Visit (INDEPENDENT_AMBULATORY_CARE_PROVIDER_SITE_OTHER): Payer: Medicare Other | Admitting: Internal Medicine

## 2012-03-28 VITALS — BP 122/76 | HR 61 | Wt 191.0 lb

## 2012-03-28 DIAGNOSIS — I10 Essential (primary) hypertension: Secondary | ICD-10-CM | POA: Diagnosis not present

## 2012-03-28 DIAGNOSIS — R609 Edema, unspecified: Secondary | ICD-10-CM

## 2012-03-28 NOTE — Assessment & Plan Note (Addendum)
Ongoing edema, did not improve w/ Lasix. Most likely etiology is amlodipine. Question of  Orthopnea. Last echocardiogram in 2010, EF 50-55%, LVH. Plan: Low-salt diet, stop amlodipine. BP currently in the low side,  likely will remain wnl even after stop CCBs but will increase HCTZ if needed, see instructions. If not improving, consider echocardiogram to assess LV function given question of orthopnea

## 2012-03-28 NOTE — Progress Notes (Signed)
  Subjective:    Patient ID: Samantha [name], female    DOB: 1940-06-06, 72 y.o.   MRN: [id number]  HPI Patient seen today for evaluation of lower extremity edema. She was seen last month, advised to take temporarily Lasix instead of HCTZ. Edema did not improve. Describes the edema as symmetric, worse at the end of the day, mostly around her ankles. Was also seen at the ER with GI symptoms and fever, urine culture came back positive, was prescribed Cipro, repeated urine culture negative. Currently, BPs are running the low side, sometimes skips HCTZ because her BP drops to the 90s.  Past Medical History  Diagnosis Date  . OSA (obstructive sleep apnea) 09/2007    dx w/ a sleep study, Rx CPAP, weight loss  . Hyperlipidemia   . Hypertension   . Chest pain, atypical 11/2006    saw cards: neg/normal Holter, ECHo and myoview, admitted w/ CP 4/09 neg enz.and CT chest had a 9 beat NSVT  . Fibromyalgia   . Abnormal CT of the chest 2008    follow up CT, April 2009: unchanged. No f/u suggested   . 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 Shingles 11/2009  . Diabetes mellitus   . Reactive airway disease   . Vaginal dysplasia    Past Surgical History  Procedure Laterality Date  . Thyroidectomy, partial      in the 60s  . Cervical laminectomy  2001    in [place]  . Abdominal hysterectomy    . Foot surgery    . Breast biopsy  1999  . Cervical fusion    . Abdominal hysterectomy       Review of Systems Denies chest pain, has occasional palpitations, no shortness or breath. Orthopnea? She uses 4 pillows, to prevent shortness of breath but also to help her neck pain    Objective:   Physical Exam General -- alert, well-developed  Neck -- no JVD at 45 Lungs -- normal respiratory effort, no intercostal retractions, no accessory muscle use, and normal breath sounds.   Heart-- normal rate, regular rhythm, no murmur, and no gallop.   Extremities--   Calves symmetric, L>R  peri-ankle edema Psych-- Cognition and judgment appear intact. Alert and cooperative with normal attention span and concentration.  not anxious appearing and not depressed appearing.       Assessment & Plan:

## 2012-03-28 NOTE — Assessment & Plan Note (Signed)
See "edema"

## 2012-03-28 NOTE — Patient Instructions (Addendum)
Stop amlodipine Low-salt diet Check the  blood pressure 4-5 times a week, be sure it is between 110/60 and 140/85. Takes hydrochlorothiazide half tablet every day. If your blood pressure is consistently more than 140/85, take a whole tablet. If your swelling is not improving within the next 3 weeks, let me know. Please keep the appointment with your heart doctor  Sodium-Controlled Diet Sodium is a mineral. It is found in many foods. Sodium may be found naturally or added during the making of a food. The most common form of sodium is salt, which is made up of sodium and chloride. Reducing your sodium intake involves changing your eating habits. The following guidelines will help you reduce the sodium in your diet:  Stop using the salt shaker.  Use salt sparingly in cooking and baking.  Substitute with sodium-free seasonings and spices.  Do not use a salt substitute (potassium chloride) without your caregiver's permission.  Include a variety of fresh, unprocessed foods in your diet.  Limit the use of processed and convenience foods that are high in sodium. USE THE FOLLOWING FOODS SPARINGLY: Breads/Starches  Commercial bread stuffing, commercial pancake or waffle mixes, coating mixes. Waffles. Croutons. Prepared (boxed or frozen) potato, rice, or noodle mixes that contain salt or sodium. Salted [place] fries or hash browns. Salted popcorn, breads, crackers, chips, or snack foods. Vegetables  Vegetables canned with salt or prepared in cream, butter, or cheese sauces. Sauerkraut. Tomato or vegetable juices canned with salt.  Fresh vegetables are allowed if rinsed thoroughly. Fruit  Fruit is okay to eat. Meat and Meat Substitutes  Salted or smoked meats, such as bacon or Canadian bacon, chipped or corned beef, hot dogs, salt pork, luncheon meats, pastrami, ham, or sausage. Canned or smoked fish, poultry, or meat. Processed cheese or cheese spreads, blue or Roquefort cheese. Battered or  frozen fish products. Prepared spaghetti sauce. Baked beans. Reuben sandwiches. Salted nuts. Caviar. Milk  Limit buttermilk to 1 cup per week. Soups and Combination Foods  Bouillon cubes, canned or dried soups, broth, consomm. Convenience (frozen or packaged) dinners with more than 600 mg sodium. Pot pies, pizza, Asian food, fast food cheeseburgers, and specialty sandwiches. Desserts and Sweets  Regular (salted) desserts, pie, commercial fruit snack pies, commercial snack cakes, canned puddings.  Eat desserts and sweets in moderation. Fats and Oils  Gravy mixes or canned gravy. No more than 1 to 2 tbs of salad dressing. Chip dips.  Eat fats and oils in moderation. Beverages  See those listed under the vegetables and milk groups. Condiments  Ketchup, mustard, meat sauces, salsa, regular (salted) and lite soy sauce or mustard. Dill pickles, olives, meat tenderizer. Prepared horseradish or pickle relish. Dutch-processed cocoa. Baking powder or baking soda used medicinally. Worcestershire sauce. "Light" salt. Salt substitute, unless approved by your caregiver. Document Released: 07/07/2001 Document Revised: 04/09/2011 Document Reviewed: 02/07/2009 [facility] Patient Information 2013 [place], [place].

## 2012-03-30 ENCOUNTER — Other Ambulatory Visit: Payer: Self-pay | Admitting: Internal Medicine

## 2012-03-31 NOTE — Telephone Encounter (Signed)
Refill done.  

## 2012-04-08 ENCOUNTER — Ambulatory Visit (INDEPENDENT_AMBULATORY_CARE_PROVIDER_SITE_OTHER): Payer: Medicare Other | Admitting: Cardiology

## 2012-04-08 ENCOUNTER — Encounter: Payer: Self-pay | Admitting: Cardiology

## 2012-04-08 VITALS — BP 120/70 | HR 78 | Ht 63.0 in | Wt 191.0 lb

## 2012-04-08 DIAGNOSIS — E785 Hyperlipidemia, unspecified: Secondary | ICD-10-CM | POA: Diagnosis not present

## 2012-04-08 DIAGNOSIS — I8390 Asymptomatic varicose veins of unspecified lower extremity: Secondary | ICD-10-CM

## 2012-04-08 DIAGNOSIS — I10 Essential (primary) hypertension: Secondary | ICD-10-CM | POA: Diagnosis not present

## 2012-04-08 DIAGNOSIS — I8393 Asymptomatic varicose veins of bilateral lower extremities: Secondary | ICD-10-CM

## 2012-04-08 DIAGNOSIS — R609 Edema, unspecified: Secondary | ICD-10-CM | POA: Diagnosis not present

## 2012-04-08 MED ORDER — FUROSEMIDE 20 MG PO TABS: 40.0000 mg | ORAL_TABLET | Freq: Every day | ORAL | Status: DC

## 2012-04-08 NOTE — Progress Notes (Signed)
HPI Samantha [name] returns today for evaluation and management of increasing lower extremity edema. This started increasing several months ago. She was on Lasix 40 mg a day for 10 days. She also had her amlodipine discontinued. This helped temporarily but is coming back.  She has a history of obstructive sleep apnea. Her echocardiogram several years ago did not show any pulmonary hypertension and showed good LV and RV function. She cannot wear CPAP. She says she sleeps on her side and not her back.  She denies orthopnea or PND. She's had no chest pains.  Past Medical History  Diagnosis Date  . OSA (obstructive sleep apnea) 09/2007    dx w/ a sleep study, Rx CPAP, weight loss  . Hyperlipidemia   . Hypertension   . Chest pain, atypical 11/2006    saw cards: neg/normal Holter, ECHo and myoview, admitted w/ CP 4/09 neg enz.and CT chest had a 9 beat NSVT  . Fibromyalgia   . Abnormal CT of the chest 2008    follow up CT, April 2009: unchanged. No f/u suggested   . 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 Shingles 11/2009  . Diabetes mellitus   . Reactive airway disease   . Vaginal dysplasia     Current Outpatient Prescriptions  Medication Sig Dispense Refill  . albuterol (VENTOLIN HFA) 108 (90 BASE) MCG/ACT inhaler Inhale 2 puffs into the lungs every 6 (six) hours as needed.  18 g  2  . aspirin 81 MG tablet Take 81 mg by mouth daily.        [name] CALCIUM PO Take 1 tablet by mouth daily.        . cetirizine (ZYRTEC) 10 MG tablet Take 10 mg by mouth daily.      . cyclobenzaprine (FLEXERIL) 5 MG tablet Take 5 mg by mouth 3 (three) times daily as needed. For muscle relaxer      . famotidine (PEPCID) 20 MG tablet Take 20 mg by mouth at bedtime as needed. For acid reflux      . fluticasone (FLONASE) 50 MCG/ACT nasal spray Place 2 sprays into the nose daily.  1 g  2  . Glucosamine-Chondroitin-MSM 500-200-150 MG TABS Take 1 tablet by mouth every Monday, Wednesday, and Friday.       .  hydrochlorothiazide (HYDRODIURIL) 25 MG tablet Take 0.5 tablets (12.5 mg total) by mouth daily.  45 tablet  1  . lisinopril (PRINIVIL,ZESTRIL) 40 MG tablet Take 1 tablet (40 mg total) by mouth daily.  30 tablet  11  . metFORMIN (GLUCOPHAGE-XR) 500 MG 24 hr tablet TAKE 1 TABLET BY MOUTH DAILY  30 tablet  6  . Multiple Vitamin (MULTIVITAMIN) capsule Take 1 capsule by mouth daily.        . Nebivolol HCl (BYSTOLIC) 20 MG TABS Take 1 tablet (20 mg total) by mouth daily.  30 tablet  3  . omeprazole (PRILOSEC OTC) 20 MG tablet Take 20 mg by mouth daily. Take one 30-60 min before first meal of the day      . ondansetron (ZOFRAN ODT) 4 MG disintegrating tablet Take 1 tablet (4 mg total) by mouth every 8 (eight) hours as needed for nausea.  20 tablet  0  . Potassium 99 MG TABS Take 99 mg by mouth daily.       . pravastatin (PRAVACHOL) 40 MG tablet Take 40 mg by mouth every evening.      . pregabalin (LYRICA) 100 MG capsule Take  100 mg by mouth at bedtime as needed. For pain      . furosemide (LASIX) 20 MG tablet Take 2 tablets (40 mg total) by mouth daily.  60 tablet  3   No current facility-administered medications for this visit.    Allergies  Allergen Reactions  . Cefuroxime Axetil     REACTION: urticaria (hives)  . Oxycodone Nausea And Vomiting  . Seldane (Terfenadine)     REACTION: urticaria (hives)  . Tramadol     REACTION: vomitting    Family History  Problem Relation Age of Onset  . Allergies Sister   . Asthma Sister   . Asthma Paternal Grandmother   . Heart disease Father   . Heart disease Mother   . Heart disease      paternal grandparents, maternal grandparents,   . Heart disease Brother   . Lung cancer Mother   . Breast cancer Neg Hx   . Colon cancer Neg Hx     History   Social History  . Marital Status: Married    Spouse Name: N/A    Number of Children: 2  . Years of Education: N/A   Occupational History  . Retired    Social History Main Topics  . Smoking  status: Former Games developer  . Smokeless tobacco: Never Used  . Alcohol Use: No  . Drug Use: No  . Sexually Active: Not on file   Other Topics Concern  . Not on file   Social History Narrative   On disability since 2000--- also husband has MS    ROS ALL NEGATIVE EXCEPT THOSE NOTED IN HPI  PE  General Appearance: well developed, well nourished in no acute distress, overweight HEENT: symmetrical face, PERRLA, good dentition  Neck: no JVD, thyromegaly, or adenopathy, trachea midline Chest: symmetric without deformity Cardiac: PMI non-displaced, RRR, normal S1, S2, no gallop or murmur Lung: clear to ausculation and percussion Vascular: all pulses full without bruits  Abdominal: nondistended, nontender, good bowel sounds, no HSM, no bruits Extremities: no cyanosis, clubbing,, 1+ pedal edema no sign of DVT, varicosities  Skin: normal color, no rashes Neuro: alert and oriented x 3, non-focal Pysch: normal affect  EKG NORMAL SINUS RHYTHM WITH FIRST-DEGREE AV BLOCK, NO ST SEGMENT CHANGES.   BMET    Component Value Date/Time   NA 137 03/03/2012 1915   K 3.7 03/03/2012 1915   CL 105 03/03/2012 1915   CO2 22 03/03/2012 1915   GLUCOSE 107* 03/03/2012 1915   BUN 12 03/03/2012 1915   CREATININE 0.75 03/03/2012 1915   CREATININE 1.11* 08/29/2011 1640   CALCIUM 8.6 03/03/2012 1915   GFRNONAA 84* 03/03/2012 1915   GFRAA >90 03/03/2012 1915    Lipid Panel     Component Value Date/Time   CHOL 187 08/14/2011 1102   TRIG 73.0 08/14/2011 1102   HDL 89.40 08/14/2011 1102   CHOLHDL 2 08/14/2011 1102   VLDL 14.6 08/14/2011 1102   LDLCALC 83 08/14/2011 1102    CBC    Component Value Date/Time   WBC 4.2 03/03/2012 1915   RBC 4.55 03/03/2012 1915   HGB 12.8 03/03/2012 1915   HCT 37.9 03/03/2012 1915   PLT 132* 03/03/2012 1915   MCV 83.3 03/03/2012 1915   MCH 28.1 03/03/2012 1915   MCHC 33.8 03/03/2012 1915   RDW 14.6 03/03/2012 1915   LYMPHSABS 0.6* 03/03/2012 1915   MONOABS 0.1 03/03/2012 1915   EOSABS 0.0 03/03/2012 1915    BASOSABS 0.0 03/03/2012 1915

## 2012-04-08 NOTE — Assessment & Plan Note (Signed)
Or edema has returned without furosemide. We'll restart at 40 mg a day with potassium supplementation. I've also ordered an echocardiogram to assess for pulmonary hypertension and right-sided dysfunction. I've encouraged her to reconsider CPAP. If her echocardiogram is unremarkable, I'll see her back when necessary.

## 2012-04-08 NOTE — Patient Instructions (Addendum)
Your physician has requested that you have an echocardiogram. Echocardiography is a painless test that uses sound waves to create images of your heart. It provides your doctor with information about the size and shape of your heart and how well your heart's chambers and valves are working. This procedure takes approximately one hour. There are no restrictions for this procedure.  Resume taking your lasix (furosemide) 40mg  daily for your edema  Your physician recommends that you return for lab work  For a BMP the same day as your Echo  Your physician recommends that you schedule a follow-up appointment in: 1 year.

## 2012-04-17 ENCOUNTER — Ambulatory Visit (HOSPITAL_COMMUNITY): Payer: Medicare Other | Attending: Cardiology

## 2012-04-17 ENCOUNTER — Other Ambulatory Visit (INDEPENDENT_AMBULATORY_CARE_PROVIDER_SITE_OTHER): Payer: Medicare Other

## 2012-04-17 DIAGNOSIS — E119 Type 2 diabetes mellitus without complications: Secondary | ICD-10-CM | POA: Insufficient documentation

## 2012-04-17 DIAGNOSIS — I079 Rheumatic tricuspid valve disease, unspecified: Secondary | ICD-10-CM | POA: Diagnosis not present

## 2012-04-17 DIAGNOSIS — I379 Nonrheumatic pulmonary valve disorder, unspecified: Secondary | ICD-10-CM | POA: Diagnosis not present

## 2012-04-17 DIAGNOSIS — R609 Edema, unspecified: Secondary | ICD-10-CM

## 2012-04-17 DIAGNOSIS — I059 Rheumatic mitral valve disease, unspecified: Secondary | ICD-10-CM | POA: Diagnosis not present

## 2012-04-17 DIAGNOSIS — I10 Essential (primary) hypertension: Secondary | ICD-10-CM | POA: Diagnosis not present

## 2012-04-17 DIAGNOSIS — E785 Hyperlipidemia, unspecified: Secondary | ICD-10-CM | POA: Insufficient documentation

## 2012-04-17 LAB — BASIC METABOLIC PANEL
BUN: 19 mg/dL (ref 6–23)
Calcium: 8.8 mg/dL (ref 8.4–10.5)
GFR: 77.63 mL/min (ref >60.00)
Glucose, Bld: 109 mg/dL — ABNORMAL HIGH (ref 70–99)

## 2012-04-17 NOTE — Progress Notes (Signed)
Echocardiogram performed.  

## 2012-05-06 ENCOUNTER — Encounter: Payer: Self-pay | Admitting: Internal Medicine

## 2012-05-06 ENCOUNTER — Ambulatory Visit (INDEPENDENT_AMBULATORY_CARE_PROVIDER_SITE_OTHER): Payer: Medicare Other | Admitting: Internal Medicine

## 2012-05-06 VITALS — BP 114/80 | HR 62 | Temp 98.4°F | Wt 187.0 lb

## 2012-05-06 DIAGNOSIS — E119 Type 2 diabetes mellitus without complications: Secondary | ICD-10-CM | POA: Diagnosis not present

## 2012-05-06 DIAGNOSIS — R609 Edema, unspecified: Secondary | ICD-10-CM

## 2012-05-06 DIAGNOSIS — I10 Essential (primary) hypertension: Secondary | ICD-10-CM | POA: Diagnosis not present

## 2012-05-06 LAB — BASIC METABOLIC PANEL
Calcium: 9.3 mg/dL (ref 8.4–10.5)
Creatinine, Ser: 1 mg/dL (ref 0.4–1.2)
GFR: 72.16 mL/min (ref >60.00)

## 2012-05-06 LAB — HEMOGLOBIN A1C: Hgb A1c MFr Bld: 6.3 % (ref 4.6–6.5)

## 2012-05-06 MED ORDER — CETIRIZINE HCL 10 MG PO TABS: 10.0000 mg | ORAL_TABLET | Freq: Every day | ORAL | Status: DC

## 2012-05-06 NOTE — Patient Instructions (Addendum)
Use knee-high compression stockings every day. Increase Lyrica to half tablet daily, one at night Next visit in 3 months  Diabetes and Foot Care Diabetes may cause you to have a poor blood supply (circulation) to your legs and feet. Because of this, the skin may be thinner, break easier, and heal more slowly. You also may have nerve damage in your legs and feet causing decreased feeling. You may not notice minor injuries to your feet that could lead to serious problems or infections. Taking care of your feet is one of the most important things you can do for yourself.  HOME CARE INSTRUCTIONS  Do not go barefoot. Bare feet are easily injured.  Check your feet daily for blisters, cuts, and redness.  Wash your feet with warm water (not hot) and mild soap. Pat your feet and between your toes until completely dry.  Apply a moisturizing lotion that does not contain alcohol or petroleum jelly to the dry skin on your feet and to dry brittle toenails. Do not put it between your toes.  Trim your toenails straight across. Do not dig under them or around the cuticle.  Do not cut corns or calluses, or try to remove them with medicine.  Wear clean cotton socks or stockings every day. Make sure they are not too tight. Do not wear knee high stockings since they may decrease blood flow to your legs.  Wear leather shoes that fit properly and have enough cushioning. To break in new shoes, wear them just a few hours a day to avoid injuring your feet.  Wear shoes at all times, even in the house.  Do not cross your legs. This may decrease the blood flow to your feet.  If you find a minor scrape, cut, or break in the skin on your feet, keep it and the skin around it clean and dry. These areas may be cleansed with mild soap and water. Do not use peroxide, alcohol, iodine or Merthiolate.  When you remove an adhesive bandage, be sure not to harm the skin around it.  If you have a wound, look at it several  times a day to make sure it is healing.  Do not use heating pads or hot water bottles. Burns can occur. If you have lost feeling in your feet or legs, you may not know it is happening until it is too late.  Report any cuts, sores or bruises to your caregiver. Do not wait! SEEK MEDICAL CARE IF:   You have an injury that is not healing or you notice redness, numbness, burning, or tingling.  Your feet always feel cold.  You have pain or cramps in your legs and feet. SEEK IMMEDIATE MEDICAL CARE IF:   There is increasing redness, swelling, or increasing pain in the wound.  There is a red line that goes up your leg.  Pus is coming from a wound.  You develop an unexplained oral temperature above 102 F (38.9 C), or as your caregiver suggests.  You notice a bad smell coming from an ulcer or wound. MAKE SURE YOU:   Understand these instructions.  Will watch your condition.  Will get help right away if you are not doing well or get worse. Document Released: 01/13/2000 Document Revised: 04/09/2011 Document Reviewed: 07/21/2008 [facility] Patient Information 2013 [place], [place].

## 2012-05-06 NOTE — Assessment & Plan Note (Addendum)
  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 Plan:  Increase Lyrica from 100 mg at night to 50 in the morning and 100 at night. Labs

## 2012-05-06 NOTE — Assessment & Plan Note (Addendum)
Continue with mild peri-ankle edema at the end of the day described as painful. Since the last time she was here, she discontinue amlodipine, is following a low salt diet. Saw cardiology, echocardiogram was essentially normal. She was switched from HCTZ to Lasix and despite that she continue with edema. On exam, there is no evidence of peripheral vascular disease, she has mild varicose veins with phlebitis. Plan: Compression stockings. Reassess in 4 months. Patient reassured.

## 2012-05-06 NOTE — Progress Notes (Signed)
  Subjective:    Patient ID: Samantha [name], female    DOB: 1940/11/16, 72 y.o.   MRN: [id number]  HPI ROV Edema, Not improved despite discontinue amlodipine, follow a low-salt diet and change to Lasix. See assessment and plan. Swelling is mostly around the ankles and worse at the end of the day; sometimes the swelling is painful. Diabetes, good medication compliance, CBGs in the 90s. Occasionally has feet burning, mostly during the daytime. Allergies, needs a refill on antihistaminics. Hypertension, check her BP regularly, good results.  Past Medical History  Diagnosis Date  . OSA (obstructive sleep apnea) 09/2007    dx w/ a sleep study, Rx CPAP, weight loss  . Hyperlipidemia   . Hypertension   . Chest pain, atypical 11/2006    saw cards: neg/normal Holter, ECHo and myoview, admitted w/ CP 4/09 neg enz.and CT chest had a 9 beat NSVT  . Fibromyalgia   . Abnormal CT of the chest 2008    follow up CT, April 2009: unchanged. No f/u suggested   . 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 Shingles 11/2009  . Diabetes mellitus   . Reactive airway disease   . Vaginal dysplasia    Past Surgical History  Procedure Laterality Date  . Thyroidectomy, partial      in the 60s  . Cervical laminectomy  2001    in [place]  . Abdominal hysterectomy    . Foot surgery    . Breast biopsy  1999  . Cervical fusion    . Abdominal hysterectomy      Review of Systems Denies chest pain or shortness or breath No nausea, vomiting, diarrhea.     Objective:   Physical Exam BP 114/80  Pulse 62  Temp(Src) 98.4 F (36.9 C) (Oral)  Wt 187 lb (84.823 kg)  BMI 33.13 kg/m2  SpO2 98%  General -- alert, well-developed, no apparent distress Lungs -- normal respiratory effort, no intercostal retractions, no accessory muscle use, and normal breath sounds.   Heart-- normal rate, regular rhythm, no murmur, and no gallop.   Abdomen--soft, non-tender, no distention, no masses, no HSM, no  guarding, and no rigidity.   Extremities--  Today she has some very mild peri-ankle edema without redness or tenderness. Good pedal pulses bilaterally. Very few varicose veins, small, no phlebitis. Pinprick examination normal Neurologic-- alert & oriented X3 and strength normal in all extremities. Psych-- Cognition and judgment appear intact. Alert and cooperative with normal attention span and concentration.  not anxious appearing and not depressed appearing.      Assessment & Plan:

## 2012-05-06 NOTE — Assessment & Plan Note (Addendum)
Well-controlled, recently HCTZ was switched to Lasix d/t edema, check a BMP

## 2012-05-09 ENCOUNTER — Encounter: Payer: Self-pay | Admitting: *Deleted

## 2012-06-17 ENCOUNTER — Ambulatory Visit (INDEPENDENT_AMBULATORY_CARE_PROVIDER_SITE_OTHER): Payer: Medicare Other | Admitting: Internal Medicine

## 2012-06-17 VITALS — BP 124/82 | HR 58 | Temp 98.3°F | Wt 189.0 lb

## 2012-06-17 DIAGNOSIS — R609 Edema, unspecified: Secondary | ICD-10-CM

## 2012-06-17 DIAGNOSIS — M1990 Unspecified osteoarthritis, unspecified site: Secondary | ICD-10-CM | POA: Diagnosis not present

## 2012-06-17 MED ORDER — FLUTICASONE PROPIONATE 50 MCG/ACT NA SUSP: 2.0000 | Freq: Every day | NASAL | Status: DC

## 2012-06-17 NOTE — Progress Notes (Signed)
  Subjective:    Patient ID: Samantha [name], female    DOB: 09/13/1940, 72 y.o.   MRN: [id number]  HPI Acute visit Having left ankle pain for a while, worse for 1 month, symptoms exacerbated by walking or standing for long time. No recent injury . Ibuprofen helps to some extent, does not take ibuprofen daily. Using over-the-counter brace for the ankle and that is helping.  Past Medical History  Diagnosis Date  . OSA (obstructive sleep apnea) 09/2007    dx w/ a sleep study, Rx CPAP, weight loss  . Hyperlipidemia   . Hypertension   . Chest pain, atypical 11/2006    saw cards: neg/normal Holter, ECHo and myoview, admitted w/ CP 4/09 neg enz.and CT chest had a 9 beat NSVT  . Fibromyalgia   . Abnormal CT of the chest 2008    follow up CT, April 2009: unchanged. No f/u suggested   . 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 Shingles 11/2009  . Diabetes mellitus   . Reactive airway disease   . Vaginal dysplasia    Past Surgical History  Procedure Laterality Date  . Thyroidectomy, partial      in the 60s  . Cervical laminectomy  2001    in [place]  . Abdominal hysterectomy    . Foot surgery    . Breast biopsy  1999  . Cervical fusion    . Abdominal hysterectomy      Review of Systems Medication list reviewed, not having nausea thus deleting zofran from med list. She is not taking Flexeril, medication discontinued. Allergies, needs at [facility] refill. Had LE edema recently, edema better. Using compression stockings    Objective:   Physical Exam  General -- alert, well-developed, NAD.   Extremities-- no pretibial edema bilaterally; ankles without deformities or swelling, no redness or warmness. Left ankle is full range of motion, mild  Pain? Good pedal pulses bilaterally Neurologic-- alert & oriented X3 and strength normal in all extremities. Psych-- Cognition and judgment appear intact. Alert and cooperative with normal attention span and concentration.  not  anxious appearing and not depressed appearing.       Assessment & Plan:

## 2012-06-17 NOTE — Assessment & Plan Note (Signed)
Reports is improving, using compression stockings

## 2012-06-17 NOTE — Patient Instructions (Addendum)
Will refer you to a orthopedic surgeon

## 2012-06-17 NOTE — Assessment & Plan Note (Addendum)
Ankle pain, likely DJD, needs to see orthopedic surgery, has seen Dr. Linwood Dibbles before. Referral done. Using OTC brace, is working for her. (States I may get a request for a ankle brace from a company, she requests me to ignore it, doing well w/ OTC brace) Okay to take ibuprofen on limited bases.[name]

## 2012-06-18 ENCOUNTER — Encounter: Payer: Self-pay | Admitting: Internal Medicine

## 2012-06-26 ENCOUNTER — Telehealth: Payer: Self-pay | Admitting: Internal Medicine

## 2012-06-26 NOTE — Telephone Encounter (Signed)
Advise patient: If she suspects a stroke, she needs to go to the ER. Also go to the ER if severe headache, double vision, slurred speech, weakness in the arms or legs, face numbness. If she decides not to go to the ER, then she needs to be seen tomorrow.  Advise patient to come back in the morning rather than in the afternoon for a work-in.

## 2012-06-26 NOTE — Telephone Encounter (Addendum)
Discuss with patient will go to ED today. Pt seemed a little hesitate because she states that she does not have anyone to sit with her husband and is afraid to leave him especially if she has to be admitted. Spoke with Pt advise her of the risks/dangerous of not being evaluated. Pt ok and assured me that she would go to ED today.Appt cancel for tomorrow.

## 2012-06-26 NOTE — Telephone Encounter (Signed)
Patient is calling complaining about pain in her groin area and "crawling sensation" in her head on the right side. Woke up and saw a spot of blood red on her ceiling and there is no red on her ceiling. Says she feels fine now, has been feeling tired all day. No other symptoms. Wants CT just in case she may have had a stroke. Patient also mentioned that she had a severe muscle spasm a few days ago. "Worse muscle spasm I have ever had." Scheduled patient an appointment for tomorrow afternoon.

## 2012-06-27 ENCOUNTER — Encounter (HOSPITAL_COMMUNITY): Payer: Self-pay | Admitting: Emergency Medicine

## 2012-06-27 ENCOUNTER — Emergency Department (HOSPITAL_COMMUNITY): Payer: Medicare Other

## 2012-06-27 ENCOUNTER — Inpatient Hospital Stay (HOSPITAL_COMMUNITY)
Admission: EM | Admit: 2012-06-27 | Discharge: 2012-06-28 | DRG: 069 | Disposition: A | Discharge status: Home / Self Care | Payer: Medicare Other | Attending: Family Medicine | Admitting: Family Medicine

## 2012-06-27 ENCOUNTER — Ambulatory Visit: Payer: Medicare Other | Admitting: Internal Medicine

## 2012-06-27 ENCOUNTER — Inpatient Hospital Stay (HOSPITAL_COMMUNITY): Payer: Medicare Other

## 2012-06-27 DIAGNOSIS — Z825 Family history of asthma and other chronic lower respiratory diseases: Secondary | ICD-10-CM

## 2012-06-27 DIAGNOSIS — Z801 Family history of malignant neoplasm of trachea, bronchus and lung: Secondary | ICD-10-CM | POA: Diagnosis not present

## 2012-06-27 DIAGNOSIS — E119 Type 2 diabetes mellitus without complications: Secondary | ICD-10-CM | POA: Diagnosis present

## 2012-06-27 DIAGNOSIS — E1169 Type 2 diabetes mellitus with other specified complication: Secondary | ICD-10-CM | POA: Diagnosis present

## 2012-06-27 DIAGNOSIS — Z602 Problems related to living alone: Secondary | ICD-10-CM | POA: Diagnosis not present

## 2012-06-27 DIAGNOSIS — R51 Headache: Secondary | ICD-10-CM | POA: Diagnosis not present

## 2012-06-27 DIAGNOSIS — Z79899 Other long term (current) drug therapy: Secondary | ICD-10-CM | POA: Diagnosis not present

## 2012-06-27 DIAGNOSIS — G4733 Obstructive sleep apnea (adult) (pediatric): Secondary | ICD-10-CM | POA: Diagnosis present

## 2012-06-27 DIAGNOSIS — R519 Headache, unspecified: Secondary | ICD-10-CM

## 2012-06-27 DIAGNOSIS — Z8249 Family history of ischemic heart disease and other diseases of the circulatory system: Secondary | ICD-10-CM | POA: Diagnosis not present

## 2012-06-27 DIAGNOSIS — Z7902 Long term (current) use of antithrombotics/antiplatelets: Secondary | ICD-10-CM | POA: Diagnosis not present

## 2012-06-27 DIAGNOSIS — E785 Hyperlipidemia, unspecified: Secondary | ICD-10-CM | POA: Diagnosis present

## 2012-06-27 DIAGNOSIS — E1149 Type 2 diabetes mellitus with other diabetic neurological complication: Secondary | ICD-10-CM | POA: Diagnosis present

## 2012-06-27 DIAGNOSIS — G459 Transient cerebral ischemic attack, unspecified: Secondary | ICD-10-CM | POA: Diagnosis not present

## 2012-06-27 DIAGNOSIS — Z87891 Personal history of nicotine dependence: Secondary | ICD-10-CM | POA: Diagnosis not present

## 2012-06-27 DIAGNOSIS — Z888 Allergy status to other drugs, medicaments and biological substances status: Secondary | ICD-10-CM | POA: Diagnosis not present

## 2012-06-27 DIAGNOSIS — K219 Gastro-esophageal reflux disease without esophagitis: Secondary | ICD-10-CM | POA: Diagnosis present

## 2012-06-27 DIAGNOSIS — R29898 Other symptoms and signs involving the musculoskeletal system: Secondary | ICD-10-CM | POA: Diagnosis present

## 2012-06-27 DIAGNOSIS — G458 Other transient cerebral ischemic attacks and related syndromes: Secondary | ICD-10-CM | POA: Diagnosis present

## 2012-06-27 DIAGNOSIS — IMO0001 Reserved for inherently not codable concepts without codable children: Secondary | ICD-10-CM | POA: Diagnosis present

## 2012-06-27 DIAGNOSIS — R6889 Other general symptoms and signs: Secondary | ICD-10-CM | POA: Diagnosis not present

## 2012-06-27 DIAGNOSIS — J45909 Unspecified asthma, uncomplicated: Secondary | ICD-10-CM | POA: Diagnosis present

## 2012-06-27 DIAGNOSIS — Z981 Arthrodesis status: Secondary | ICD-10-CM

## 2012-06-27 DIAGNOSIS — I10 Essential (primary) hypertension: Secondary | ICD-10-CM | POA: Diagnosis present

## 2012-06-27 DIAGNOSIS — E1142 Type 2 diabetes mellitus with diabetic polyneuropathy: Secondary | ICD-10-CM | POA: Diagnosis present

## 2012-06-27 HISTORY — DX: Cardiac murmur, unspecified: R01.1

## 2012-06-27 HISTORY — DX: Transient cerebral ischemic attack, unspecified: G45.9

## 2012-06-27 HISTORY — DX: Migraine with aura, not intractable, without status migrainosus: G43.109

## 2012-06-27 HISTORY — DX: Unspecified osteoarthritis, unspecified site: M19.90

## 2012-06-27 HISTORY — DX: Personal history of other diseases of the digestive system: Z87.19

## 2012-06-27 HISTORY — DX: Gastro-esophageal reflux disease without esophagitis: K21.9

## 2012-06-27 HISTORY — DX: Unspecified asthma, uncomplicated: J45.909

## 2012-06-27 HISTORY — DX: Malignant neoplasm of vagina: C52

## 2012-06-27 HISTORY — DX: Other specified abnormal immunological findings in serum: R76.89

## 2012-06-27 HISTORY — DX: Other specified abnormal immunological findings in serum: R76.8

## 2012-06-27 HISTORY — DX: Type 2 diabetes mellitus without complications: E11.9

## 2012-06-27 LAB — COMPREHENSIVE METABOLIC PANEL
ALT: 15 U/L (ref 0–35)
AST: 22 U/L (ref 0–37)
CO2: 27 mEq/L (ref 19–32)
Calcium: 9.7 mg/dL (ref 8.4–10.5)
GFR calc non Af Amer: 63 mL/min — ABNORMAL LOW (ref >90)
Sodium: 139 mEq/L (ref 135–145)

## 2012-06-27 LAB — RAPID URINE DRUG SCREEN, HOSP PERFORMED
Barbiturates: NOT DETECTED
Cocaine: NOT DETECTED
Tetrahydrocannabinol: NOT DETECTED

## 2012-06-27 LAB — CBC WITH DIFFERENTIAL/PLATELET
Basophils Absolute: 0 10*3/uL (ref 0.0–0.1)
Eosinophils Relative: 2 % (ref 0–5)
Lymphocytes Relative: 41 % (ref 12–46)
Neutro Abs: 2.5 10*3/uL (ref 1.7–7.7)
Platelets: 153 10*3/uL (ref 150–400)
RDW: 14 % (ref 11.5–15.5)
WBC: 5 10*3/uL (ref 4.0–10.5)

## 2012-06-27 LAB — HEMOGLOBIN A1C: Hgb A1c MFr Bld: 6.1 % — ABNORMAL HIGH (ref <5.7)

## 2012-06-27 MED ORDER — ASPIRIN EC 81 MG PO TBEC
81.0000 mg | DELAYED_RELEASE_TABLET | Freq: Every day | ORAL | Status: DC
  Filled 2012-06-27: qty 1

## 2012-06-27 MED ORDER — FUROSEMIDE 20 MG PO TABS
20.0000 mg | ORAL_TABLET | Freq: Two times a day (BID) | ORAL | Status: DC
  Administered 2012-06-27 – 2012-06-28 (×2): 20 mg via ORAL
  Filled 2012-06-27 (×4): qty 1

## 2012-06-27 MED ORDER — NEBIVOLOL HCL 10 MG PO TABS
20.0000 mg | ORAL_TABLET | Freq: Every day | ORAL | Status: DC
  Administered 2012-06-27 – 2012-06-28 (×2): 20 mg via ORAL
  Filled 2012-06-27 (×2): qty 2

## 2012-06-27 MED ORDER — CLOPIDOGREL BISULFATE 75 MG PO TABS
75.0000 mg | ORAL_TABLET | Freq: Every day | ORAL | Status: DC
  Administered 2012-06-28: 75 mg via ORAL
  Filled 2012-06-27: qty 1

## 2012-06-27 MED ORDER — METFORMIN HCL ER 500 MG PO TB24
500.0000 mg | ORAL_TABLET | Freq: Every day | ORAL | Status: DC
  Administered 2012-06-28: 500 mg via ORAL
  Filled 2012-06-27 (×2): qty 1

## 2012-06-27 MED ORDER — ASPIRIN 325 MG PO TABS
325.0000 mg | ORAL_TABLET | Freq: Every day | ORAL | Status: DC
  Filled 2012-06-27: qty 1

## 2012-06-27 MED ORDER — FAMOTIDINE 20 MG PO TABS
20.0000 mg | ORAL_TABLET | Freq: Every evening | ORAL | Status: DC | PRN
  Filled 2012-06-27: qty 1

## 2012-06-27 MED ORDER — PREGABALIN 50 MG PO CAPS
100.0000 mg | ORAL_CAPSULE | Freq: Every day | ORAL | Status: DC
  Administered 2012-06-27: 100 mg via ORAL
  Filled 2012-06-27: qty 2

## 2012-06-27 MED ORDER — HEPARIN SODIUM (PORCINE) 5000 UNIT/ML IJ SOLN
5000.0000 [IU] | Freq: Three times a day (TID) | INTRAMUSCULAR | Status: DC
  Administered 2012-06-27 – 2012-06-28 (×3): 5000 [IU] via SUBCUTANEOUS
  Filled 2012-06-27 (×6): qty 1

## 2012-06-27 MED ORDER — LISINOPRIL 40 MG PO TABS
40.0000 mg | ORAL_TABLET | Freq: Every day | ORAL | Status: DC
  Administered 2012-06-27 – 2012-06-28 (×2): 40 mg via ORAL
  Filled 2012-06-27 (×2): qty 1

## 2012-06-27 MED ORDER — ASPIRIN 325 MG PO TABS
325.0000 mg | ORAL_TABLET | Freq: Once | ORAL | Status: AC
  Administered 2012-06-27: 325 mg via ORAL
  Filled 2012-06-27: qty 1

## 2012-06-27 NOTE — ED Provider Notes (Addendum)
History     CSN: [id number]  Arrival date & time 06/27/12  0308   First MD Initiated Contact with Patient 06/27/12 [phone]      Chief Complaint  Patient presents with  . Headache     HPI Patient reports developing intermittent right-sided headache over the past 3 days and then yesterday developed severe weakness of her right upper extremity.  She stated she would look at her right arm and could not get it removed.  The only way she can move her right arm as if she grabbed with her left hand.  She states this lasted the majority of the day.  She call her primary care physician asked that she come the emergency department for evaluation for possible stroke.  The patient has no prior history of stroke.  She has a history of hypertension, diabetes, hyperlipidemia, prior tobacco abuse.  She also reports some pain in her right shoulder region.  She states that all of her right arm symptoms have now resolved and she feels no weakness of her arms or legs.  She reports she never had issues with her right leg yesterday.  Friend at the bedside has no difficulty with speech.  No facial asymmetry.  No recent fevers or chills.  No prior history of migraine headaches.  She does feel pain behind her right eye.  She's tried ibuprofen with some improvement in her symptoms.   Past Medical History  Diagnosis Date  . OSA (obstructive sleep apnea) 09/2007    dx w/ a sleep study, Rx CPAP, weight loss  . Hyperlipidemia   . Hypertension   . Chest pain, atypical 11/2006    saw cards: neg/normal Holter, ECHo and myoview, admitted w/ CP 4/09 neg enz.and CT chest had a 9 beat NSVT  . Fibromyalgia   . Abnormal CT of the chest 2008    follow up CT, April 2009: unchanged. No f/u suggested   . 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 Shingles 11/2009  . Diabetes mellitus   . Reactive airway disease   . Vaginal dysplasia     Past Surgical History  Procedure Laterality Date  . Thyroidectomy, partial       in the 60s  . Cervical laminectomy  2001    in [place]  . Abdominal hysterectomy    . Foot surgery    . Breast biopsy  1999  . Cervical fusion    . Abdominal hysterectomy      Family History  Problem Relation Age of Onset  . Allergies Sister   . Asthma Sister   . Asthma Paternal Grandmother   . Heart disease Father   . Heart disease Mother   . Heart disease      paternal grandparents, maternal grandparents,   . Heart disease Brother   . Lung cancer Mother   . Breast cancer Neg Hx   . Colon cancer Neg Hx     History  Substance Use Topics  . Smoking status: Former Games developer  . Smokeless tobacco: Never Used  . Alcohol Use: No    OB History   Grav Para Term Preterm Abortions TAB SAB Ect Mult Living                  Review of Systems  All other systems reviewed and are negative.    Allergies  Cefuroxime axetil; Oxycodone; Seldane; and Tramadol  Home Medications   Current Outpatient Rx  Name  Route  Sig  Dispense  Refill  . albuterol (VENTOLIN HFA) 108 (90 BASE) MCG/ACT inhaler   Inhalation   Inhale 2 puffs into the lungs every 6 (six) hours as needed.   18 g   2   . aspirin 81 MG tablet   Oral   Take 81 mg by mouth daily.           [name] CALCIUM PO   Oral   Take 1 tablet by mouth daily.           . cetirizine (ZYRTEC) 10 MG tablet   Oral   Take 1 tablet (10 mg total) by mouth daily.   90 tablet   1   . famotidine (PEPCID) 20 MG tablet   Oral   Take 20 mg by mouth at bedtime as needed. For acid reflux         . fluticasone (FLONASE) 50 MCG/ACT nasal spray   Nasal   Place 2 sprays into the nose daily.   1 g   6   . furosemide (LASIX) 20 MG tablet   Oral   Take 20 mg by mouth 2 (two) times daily.         . Glucosamine-Chondroitin-MSM 500-200-150 MG TABS   Oral   Take 1 tablet by mouth every Monday, Wednesday, and Friday.          [name] lisinopril (PRINIVIL,ZESTRIL) 40 MG tablet   Oral   Take 1 tablet (40 mg total) by mouth daily.    30 tablet   11   . metFORMIN (GLUCOPHAGE-XR) 500 MG 24 hr tablet   Oral   Take 500 mg by mouth daily with breakfast.         . Multiple Vitamin (MULTIVITAMIN) capsule   Oral   Take 1 capsule by mouth daily.           . Nebivolol HCl (BYSTOLIC) 20 MG TABS   Oral   Take 1 tablet (20 mg total) by mouth daily.   30 tablet   3     patient needs to schedule follow up appointment   . omeprazole (PRILOSEC OTC) 20 MG tablet   Oral   Take 20 mg by mouth daily as needed. Take one 30-60 min before first meal of the day         . pravastatin (PRAVACHOL) 40 MG tablet   Oral   Take 40 mg by mouth every evening.         . pregabalin (LYRICA) 100 MG capsule   Oral   Take 100 mg by mouth at bedtime.          . Potassium 99 MG TABS   Oral   Take 99 mg by mouth daily.            BP 141/92  Pulse 72  Temp(Src) 97.9 F (36.6 C) (Oral)  Resp 20  SpO2 97%  Physical Exam  Nursing note and vitals reviewed. Constitutional: She is oriented to person, place, and time. She appears well-developed and well-nourished. No distress.  HENT:  Head: Normocephalic and atraumatic.  Eyes: EOM are normal. Pupils are equal, round, and reactive to light.  Neck: Normal range of motion.  Cardiovascular: Normal rate, regular rhythm and normal heart sounds.   Pulmonary/Chest: Effort normal and breath sounds normal.  Abdominal: Soft. She exhibits no distension. There is no tenderness.  Musculoskeletal: Normal range of motion.  Neurological: She is alert and oriented to person, place, and time.  5/5 strength  in major muscle groups of  bilateral upper and lower extremities. Speech normal. No facial asymetry.   Skin: Skin is warm and dry.  Psychiatric: She has a normal mood and affect. Judgment normal.    ED Course  Procedures (including critical care time)   Date: 06/27/2012  Rate: 66  Rhythm: normal sinus rhythm  QRS Axis: normal  Intervals: normal  ST/T Wave abnormalities: normal   Conduction Disutrbances: none  Narrative Interpretation:   Old EKG Reviewed: No significant changes noted     Labs Reviewed  COMPREHENSIVE METABOLIC PANEL - Abnormal; Notable for the following:    Glucose, Bld 107 (*)    GFR calc non Af Amer 63 (*)    GFR calc Af Amer 73 (*)    All other components within normal limits  CBC WITH DIFFERENTIAL   Ct Head (brain) Wo Contrast  06/27/2012   *RADIOLOGY REPORT*  Clinical Data: Right-sided headache, radiating to the right eye and left neck.  Unsteady gait.  CT HEAD WITHOUT CONTRAST  Technique:  Contiguous axial images were obtained from the base of the skull through the vertex without contrast.  Comparison: CT of the cervical spine performed 03/26/2005  Findings: There is no evidence of acute infarction, mass lesion, or intra- or extra-axial hemorrhage on CT.  The posterior fossa, including the cerebellum, brainstem and fourth ventricle, is within normal limits.  The third and lateral ventricles, and basal ganglia are unremarkable in appearance.  The cerebral hemispheres are symmetric in appearance, with normal gray- white differentiation.  No mass effect or midline shift is seen.  There is no evidence of fracture; visualized osseous structures are unremarkable in appearance.  The orbits are within normal limits. The paranasal sinuses and mastoid air cells are well-aerated.  No significant soft tissue abnormalities are seen.  IMPRESSION: Unremarkable noncontrast CT of the head.   Original Report Authenticated By: Tonia Ghent, M.D.   I personally reviewed the imaging tests through PACS system I reviewed available ER/hospitalization records through the EMR   1. Headache   2. TIA (transient ischemic attack)       MDM  Symptoms concerning for TIA.  Some of this could also represent peripheral nerve palsy versus a migraine headache.  Neuro hospitalist to evaluate the patient the bedside.  I spoke with the hospitalist who agrees to admit the patient  for TIA workup.  CT head is normal.  Patient be given an aspirin now.  Bedside swallow.        Lyanne Co, MD 06/27/12 1610  Lyanne Co, MD 06/27/12 [phone]

## 2012-06-27 NOTE — Progress Notes (Signed)
Utilization Review Completed.Samantha [name] T5/30/2014  

## 2012-06-27 NOTE — Consult Note (Signed)
NEURO HOSPITALIST CONSULT NOTE    Reason for Consult: right arm weakness  HPI:                                                                                                                                          Samantha [name] is an 72 y.o. female who woke up two days ago with sever pain in her tight going area and noted pain in the interior thigh.  This eventually resolve but then she started to have " a crawling " sensation on the top of her head.  This started in the right occipital region then spread to the right parietal and frontal region.  When is stopped she had a headache in the right frontal aspect of the head.  This lasted for two days.  Today she woke up and noted she could not move her right arm.  She rubbed her arm for about 5 minutes and then had full sensation and strength all but returned.  Now she is complaining of right shoulder pain causing weakness in her right arm.   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  They start as a dark spot and then progress to larger dark areas.  She never has a HA with these. She has never seen a neurologist. She does not take abortive of Px medication for her HA.   Past Medical History  Diagnosis Date  . OSA (obstructive sleep apnea) 09/2007    dx w/ a sleep study, Rx CPAP, weight loss  . Hyperlipidemia   . Hypertension   . Chest pain, atypical 11/2006    saw cards: neg/normal Holter, ECHo and myoview, admitted w/ CP 4/09 neg enz.and CT chest had a 9 beat NSVT  . Fibromyalgia   . Abnormal CT of the chest 2008    follow up CT, April 2009: unchanged. No f/u suggested   . 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 Shingles 11/2009  . Diabetes mellitus   . Reactive airway disease   . Vaginal dysplasia     Past Surgical History  Procedure Laterality Date  . Thyroidectomy, partial      in the 60s  . Cervical laminectomy  2001    in [place]  . Abdominal hysterectomy     . Foot surgery    . Breast biopsy  1999  . Cervical fusion    . Abdominal hysterectomy      Family History  Problem Relation Age of Onset  . Allergies Sister   . Asthma Sister   . Asthma Paternal Grandmother   . Heart disease Father   . Heart disease Mother   . Heart disease      paternal grandparents, maternal  grandparents,   . Heart disease Brother   . Lung cancer Mother   . Breast cancer Neg Hx   . Colon cancer Neg Hx      Social History:  reports that she has quit smoking. She has never used smokeless tobacco. She reports that she does not drink alcohol or use illicit drugs.  Allergies  Allergen Reactions  . Cefuroxime Axetil     REACTION: urticaria (hives)  . Oxycodone Nausea And Vomiting  . Seldane (Terfenadine)     REACTION: urticaria (hives)  . Tramadol     REACTION: vomitting    MEDICATIONS:                                                                                                                     Prior to Admission:  Prescriptions prior to admission  Medication Sig Dispense Refill  . albuterol (VENTOLIN HFA) 108 (90 BASE) MCG/ACT inhaler Inhale 2 puffs into the lungs every 6 (six) hours as needed.  18 g  2  . aspirin 81 MG tablet Take 81 mg by mouth daily.        [name] CALCIUM PO Take 1 tablet by mouth daily.        . cetirizine (ZYRTEC) 10 MG tablet Take 1 tablet (10 mg total) by mouth daily.  90 tablet  1  . famotidine (PEPCID) 20 MG tablet Take 20 mg by mouth at bedtime as needed. For acid reflux      . fluticasone (FLONASE) 50 MCG/ACT nasal spray Place 2 sprays into the nose daily.  1 g  6  . furosemide (LASIX) 20 MG tablet Take 20 mg by mouth 2 (two) times daily.      . Glucosamine-Chondroitin-MSM 500-200-150 MG TABS Take 1 tablet by mouth every Monday, Wednesday, and Friday.       [name] lisinopril (PRINIVIL,ZESTRIL) 40 MG tablet Take 1 tablet (40 mg total) by mouth daily.  30 tablet  11  . metFORMIN (GLUCOPHAGE-XR) 500 MG 24 hr tablet Take 500 mg by  mouth daily with breakfast.      . Multiple Vitamin (MULTIVITAMIN) capsule Take 1 capsule by mouth daily.        . Nebivolol HCl (BYSTOLIC) 20 MG TABS Take 1 tablet (20 mg total) by mouth daily.  30 tablet  3  . omeprazole (PRILOSEC OTC) 20 MG tablet Take 20 mg by mouth daily as needed. Take one 30-60 min before first meal of the day      . pravastatin (PRAVACHOL) 40 MG tablet Take 40 mg by mouth every evening.      . pregabalin (LYRICA) 100 MG capsule Take 100 mg by mouth at bedtime.       . Potassium 99 MG TABS Take 99 mg by mouth daily.        Scheduled: . aspirin  325 mg Oral Daily  . aspirin  81 mg Oral Daily  . furosemide  20 mg Oral BID  . heparin  5,000 Units Subcutaneous  Q8H  . lisinopril  40 mg Oral Daily  . [START ON 06/28/2012] metFORMIN  500 mg Oral Q breakfast  . Nebivolol HCl  20 mg Oral Daily  . pregabalin  100 mg Oral QHS     ROS:                                                                                                                                       History obtained from the patient  General ROS: negative for - chills, fatigue, fever, night sweats, weight gain or weight loss Psychological ROS: negative for - behavioral disorder, hallucinations, memory difficulties, mood swings or suicidal ideation Ophthalmic ROS: negative for - blurry vision, double vision, eye pain or loss of vision ENT ROS: negative for - epistaxis, nasal discharge, oral lesions, sore throat, tinnitus or vertigo Allergy and Immunology ROS: negative for - hives or itchy/watery eyes Hematological and Lymphatic ROS: negative for - bleeding problems, bruising or swollen lymph nodes Endocrine ROS: negative for - galactorrhea, hair pattern changes, polydipsia/polyuria or temperature intolerance Respiratory ROS: negative for - cough, hemoptysis, shortness of breath or wheezing Cardiovascular ROS: negative for - chest pain, dyspnea on exertion, edema or irregular heartbeat Gastrointestinal ROS:  negative for - abdominal pain, diarrhea, hematemesis, nausea/vomiting or stool incontinence Genito-Urinary ROS: negative for - dysuria, hematuria, incontinence or urinary frequency/urgency Musculoskeletal ROS: negative for - joint swelling or muscular weakness Neurological ROS: as noted in HPI Dermatological ROS: negative for rash and skin lesion changes   Blood pressure 151/71, pulse 57, temperature 97.9 F (36.6 C), temperature source Oral, resp. rate 18, SpO2 100.00%.   Neurologic Examination:                                                                                                      Mental Status: Alert, oriented, thought content appropriate.  Speech fluent without evidence of aphasia.  Able to follow 3 step commands without difficulty. Cranial Nerves: II: Discs flat bilaterally; Visual fields grossly normal, pupils equal, round, reactive to light and accommodation III,IV, VI: ptosis not present, extra-ocular motions intact bilaterally V,VII: smile symmetric, facial light touch sensation normal bilaterally VIII: hearing normal bilaterally IX,X: gag reflex present XI: bilateral shoulder shrug XII: midline tongue extension Motor: Right : Upper extremity   5/5    Left:     Upper extremity   5/5  Lower extremity   5/5     Lower extremity   5/5 Tone and bulk:normal tone throughout; no atrophy  noted Sensory: Pinprick and light touch intact throughout, bilaterally--but states she has more sensation on the right arm Deep Tendon Reflexes: 2+ and symmetric throughout Plantars: Right: downgoing   Left: downgoing Cerebellar: normal finger-to-nose,  normal heel-to-shin test CV: pulses palpable throughout    Lab Results  Component Value Date/Time   CHOL 187 08/14/2011 11:02 AM    Results for orders placed during the hospital encounter of 06/27/12 (from the past 48 hour(s))  CBC WITH DIFFERENTIAL     Status: None   Collection Time    06/27/12  3:25 AM      Result Value Range    WBC 5.0  4.0 - 10.5 K/uL   RBC 4.62  3.87 - 5.11 MIL/uL   Hemoglobin 13.0  12.0 - 15.0 g/dL   HCT 16.1  09.6 - 04.5 %   MCV 81.6  78.0 - 100.0 fL   MCH 28.1  26.0 - 34.0 pg   MCHC 34.5  30.0 - 36.0 g/dL   RDW 40.9  81.1 - 91.4 %   Platelets 153  150 - 400 K/uL   Neutrophils Relative % 50  43 - 77 %   Neutro Abs 2.5  1.7 - 7.7 K/uL   Lymphocytes Relative 41  12 - 46 %   Lymphs Abs 2.1  0.7 - 4.0 K/uL   Monocytes Relative 6  3 - 12 %   Monocytes Absolute 0.3  0.1 - 1.0 K/uL   Eosinophils Relative 2  0 - 5 %   Eosinophils Absolute 0.1  0.0 - 0.7 K/uL   Basophils Relative 0  0 - 1 %   Basophils Absolute 0.0  0.0 - 0.1 K/uL  COMPREHENSIVE METABOLIC PANEL     Status: Abnormal   Collection Time    06/27/12  3:25 AM      Result Value Range   Sodium 139  135 - 145 mEq/L   Potassium 3.7  3.5 - 5.1 mEq/L   Chloride 101  96 - 112 mEq/L   CO2 27  19 - 32 mEq/L   Glucose, Bld 107 (*) 70 - 99 mg/dL   BUN 20  6 - 23 mg/dL   Creatinine, Ser 7.82  0.50 - 1.10 mg/dL   Calcium 9.7  8.4 - 95.6 mg/dL   Total Protein 7.6  6.0 - 8.3 g/dL   Albumin 4.1  3.5 - 5.2 g/dL   AST 22  0 - 37 U/L   ALT 15  0 - 35 U/L   Alkaline Phosphatase 66  39 - 117 U/L   Total Bilirubin 0.4  0.3 - 1.2 mg/dL   GFR calc non Af Amer 63 (*) >90 mL/min   GFR calc Af Amer 73 (*) >90 mL/min   Comment:            The eGFR has been calculated     using the CKD EPI equation.     This calculation has not been     validated in all clinical     situations.     eGFR's persistently     <90 mL/min signify     possible Chronic Kidney Disease.    Ct Head (brain) Wo Contrast  06/27/2012   *RADIOLOGY REPORT*  Clinical Data: Right-sided headache, radiating to the right eye and left neck.  Unsteady gait.  CT HEAD WITHOUT CONTRAST  Technique:  Contiguous axial images were obtained from the base of the skull through the vertex without contrast.  Comparison: CT of the  cervical spine performed 03/26/2005  Findings: There is no  evidence of acute infarction, mass lesion, or intra- or extra-axial hemorrhage on CT.  The posterior fossa, including the cerebellum, brainstem and fourth ventricle, is within normal limits.  The third and lateral ventricles, and basal ganglia are unremarkable in appearance.  The cerebral hemispheres are symmetric in appearance, with normal gray- white differentiation.  No mass effect or midline shift is seen.  There is no evidence of fracture; visualized osseous structures are unremarkable in appearance.  The orbits are within normal limits. The paranasal sinuses and mastoid air cells are well-aerated.  No significant soft tissue abnormalities are seen.  IMPRESSION: Unremarkable noncontrast CT of the head.   Original Report Authenticated By: Tonia Ghent, M.D.    Assessment and plan discussed with with attending physician and they are in agreement.    Felicie Morn PA-C [facility] [phone]  06/27/2012, 10:01 AM     Assessment/Plan:  72 yo F who awoke with right arm weakness and numbness. It is possible that this represents TIA, though also possible that it was that she fell asleep on her arm, but she denies being in a position that would cause this on awakening. I do not think the muscle cramp or head sensation are related.   Given her multiple risk factors, I feel that treating this as TIA would be reasonable.   1. HgbA1c, fasting lipid panel 2. MRI, MRA  of the brain without contrast 3. Frequent neuro checks 4. Echocardiogram 5. Carotid dopplers 6. Prophylactic therapy-Antiplatelet med: change asa to Plavix - dose 75mg  7. Risk factor modification 8. Telemetry monitoring 9. PT consult, OT consult, Speech consult  Ritta Slot, MD [facility] [phone]  If 7pm- 7am, please page neurology on call at [phone].

## 2012-06-27 NOTE — Progress Notes (Signed)
VASCULAR LAB PRELIMINARY  PRELIMINARY  PRELIMINARY  PRELIMINARY  Carotid duplex  completed.    Preliminary report:  Bilateral:  No evidence of hemodynamically significant internal carotid artery stenosis.   Vertebral artery flow is antegrade.      Jessina Marse, RVT 06/27/2012, 11:18 AM

## 2012-06-27 NOTE — H&P (Signed)
[facility] History and Physical  ROBBY PIRANI ZOX:[id number] DOB: 05/03/1940 DOA: 06/27/2012  Referring physician: Patria Mane PCP: Willow Ora, MD  Specialists: Neuro  Chief Complaint: Possible CVA  HPI: Samantha [name] is a 72 y.o. female who presented to [facility] ed at the request of her PCP.  SHe states she had a sensation on the R rop of her head since 06/26/12 and noted to have a vague headache like issue as well as well as some spasms in her RLE spasm in her RLE. She felt like her Head felt swollen and couldn;t fucntion-she ususally doesn;t feel that tired.  SHe also claims to have had diffciyulty moving her Rarm.  SHe states she couldn;t move it and had to massage it with her L arm and wasn;t able to get  The arm up off the mattress an it felt "dead".  SHe states that she was sleeping on her back and doesn't think she slept badly on it.  SHe states that she still had some wekaness on the R side and coulnd;t raise it high enough o comb her hair.  This seems to be better than it was yesterday.  NO diffculty speaking or understanding.  No droopinjg of mouth, but states that she saw a red spot in the top of the ceiling which lasted abou 2-3 seconds . She states that she still has a milder headache now-she states it is anow a 3/10 although yesterday it was a 9/10-she took ibuprofen for this which seems to help   Review of Systems: The patient denies visual changes except as above slurred speech seizure-like activity tongue biting urinary incontinence, floaters: First pain shortness of breath nausea vomiting diarrhea dark stool tarry stool rash chills fever. She does have still a mild headache as dictated above and still has some right-sided arm pain  Past Medical History  Diagnosis Date  . OSA (obstructive sleep apnea) 09/2007    dx w/ a sleep study, Rx CPAP, weight loss  . Hyperlipidemia   . Hypertension   . Chest pain, atypical 11/2006    saw cards: neg/normal Holter, ECHo and myoview,  admitted w/ CP 4/09 neg enz.and CT chest had a 9 beat NSVT  . Fibromyalgia   . Abnormal CT of the chest 2008    follow up CT, April 2009: unchanged. No f/u suggested   . 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 Shingles 11/2009  . Diabetes mellitus   . Reactive airway disease   . Vaginal dysplasia   Admisson 05/09/07 for atypical CP Myoview 2005 EF 72%-for DOE  Past Surgical History  Procedure Laterality Date  . Thyroidectomy, partial      in the 60s  . Cervical laminectomy  2001    in [place]  . Abdominal hysterectomy    . Foot surgery    . Breast biopsy  1999  . Cervical fusion    . Abdominal hysterectomy     Social History:  reports that she has quit smoking. She has never used smokeless tobacco. She reports that she does not drink alcohol or use illicit drugs. Patient lives alone at home  Allergies  Allergen Reactions  . Cefuroxime Axetil     REACTION: urticaria (hives)  . Oxycodone Nausea And Vomiting  . Seldane (Terfenadine)     REACTION: urticaria (hives)  . Tramadol     REACTION: vomitting    Family History  Problem Relation Age of Onset  . Allergies Sister   .  Asthma Sister   . Asthma Paternal Grandmother   . Heart disease Father   . Heart disease Mother   . Heart disease      paternal grandparents, maternal grandparents,   . Heart disease Brother   . Lung cancer Mother   . Breast cancer Neg Hx   . Colon cancer Neg Hx      Prior to Admission medications   Medication Sig Start Date End Date Taking? Authorizing Provider  albuterol (VENTOLIN HFA) 108 (90 BASE) MCG/ACT inhaler Inhale 2 puffs into the lungs every 6 (six) hours as needed. 05/07/11  Yes Wanda Plump, MD  aspirin 81 MG tablet Take 81 mg by mouth daily.     Yes Historical Provider, MD  CALCIUM PO Take 1 tablet by mouth daily.     Yes Historical Provider, MD  cetirizine (ZYRTEC) 10 MG tablet Take 1 tablet (10 mg total) by mouth daily. 05/06/12  Yes Wanda Plump, MD  famotidine (PEPCID)  20 MG tablet Take 20 mg by mouth at bedtime as needed. For acid reflux   Yes Historical Provider, MD  fluticasone (FLONASE) 50 MCG/ACT nasal spray Place 2 sprays into the nose daily. 06/17/12 06/17/13 Yes Wanda Plump, MD  furosemide (LASIX) 20 MG tablet Take 20 mg by mouth 2 (two) times daily.   Yes Historical Provider, MD  Glucosamine-Chondroitin-MSM 500-200-150 MG TABS Take 1 tablet by mouth every Monday, Wednesday, and Friday.    Yes Historical Provider, MD  lisinopril (PRINIVIL,ZESTRIL) 40 MG tablet Take 1 tablet (40 mg total) by mouth daily. 02/11/12  Yes Gaylord Shih, MD  metFORMIN (GLUCOPHAGE-XR) 500 MG 24 hr tablet Take 500 mg by mouth daily with breakfast.   Yes Historical Provider, MD  Multiple Vitamin (MULTIVITAMIN) capsule Take 1 capsule by mouth daily.     Yes Historical Provider, MD  Nebivolol HCl (BYSTOLIC) 20 MG TABS Take 1 tablet (20 mg total) by mouth daily. 01/08/12  Yes Gaylord Shih, MD  omeprazole (PRILOSEC OTC) 20 MG tablet Take 20 mg by mouth daily as needed. Take one 30-60 min before first meal of the day   Yes Historical Provider, MD  pravastatin (PRAVACHOL) 40 MG tablet Take 40 mg by mouth every evening.   Yes Historical Provider, MD  pregabalin (LYRICA) 100 MG capsule Take 100 mg by mouth at bedtime.    Yes Historical Provider, MD  Potassium 99 MG TABS Take 99 mg by mouth daily.     Historical Provider, MD   Physical Exam: Filed Vitals:   06/27/12 0312  BP: 141/92  Pulse: 72  Temp: 97.9 F (36.6 C)  TempSrc: Oral  Resp: 20  SpO2: 97%     General:  Alert pleasant African American female no apparent distress  Eyes: Extraocular movements intact, no pallor no icterus  ENT: Throat clear uvula midline slurred speech no mouth twisting  Neck: Soft supple no JVD no bruit  Cardiovascular: S1-S2 no murmur rub or gallop  Respiratory: Clinically clear  Abdomen: Soft nontender nondistended  Skin: No rash  Musculoskeletal: Range of motion to lower extremity joints  normal-see below  Psychiatric: Euthymic  Neurologic: Patient does have weakness to the right upper arm however she has bicipital tendon insertion pain. External rotation of the shoulders beer can test and attempt to scratch the head as well as but hand and contralateral back pocket results in some amount of pain. O'Brien's test is positive  Labs on Admission:  Basic Metabolic Panel:  Recent Labs  Lab 06/27/12 0325  NA 139  K 3.7  CL 101  CO2 27  GLUCOSE 107*  BUN 20  CREATININE 0.90  CALCIUM 9.7   Liver Function Tests:  Recent Labs Lab 06/27/12 0325  AST 22  ALT 15  ALKPHOS 66  BILITOT 0.4  PROT 7.6  ALBUMIN 4.1   No results found for this basename: LIPASE, AMYLASE,  in the last 168 hours No results found for this basename: AMMONIA,  in the last 168 hours CBC:  Recent Labs Lab 06/27/12 0325  WBC 5.0  NEUTROABS 2.5  HGB 13.0  HCT 37.7  MCV 81.6  PLT 153   Cardiac Enzymes: No results found for this basename: CKTOTAL, CKMB, CKMBINDEX, TROPONINI,  in the last 168 hours  BNP (last 3 results) No results found for this basename: PROBNP,  in the last 8760 hours CBG: No results found for this basename: GLUCAP,  in the last 168 hours  Radiological Exams on Admission: Ct Head (brain) Wo Contrast  06/27/2012   *RADIOLOGY REPORT*  Clinical Data: Right-sided headache, radiating to the right eye and left neck.  Unsteady gait.  CT HEAD WITHOUT CONTRAST  Technique:  Contiguous axial images were obtained from the base of the skull through the vertex without contrast.  Comparison: CT of the cervical spine performed 03/26/2005  Findings: There is no evidence of acute infarction, mass lesion, or intra- or extra-axial hemorrhage on CT.  The posterior fossa, including the cerebellum, brainstem and fourth ventricle, is within normal limits.  The third and lateral ventricles, and basal ganglia are unremarkable in appearance.  The cerebral hemispheres are symmetric in appearance, with  normal gray- white differentiation.  No mass effect or midline shift is seen.  There is no evidence of fracture; visualized osseous structures are unremarkable in appearance.  The orbits are within normal limits. The paranasal sinuses and mastoid air cells are well-aerated.  No significant soft tissue abnormalities are seen.  IMPRESSION: Unremarkable noncontrast CT of the head.   Original Report Authenticated By: Tonia Ghent, M.D.    EKG: Independently reviewed. Sinus rhythm, first degree AV block with PR interval of 0.0, record axis -20 no ST-T wave  Assessment/Plan Principal Problem:   TIA (transient ischemic attack) Active Problems:   DIABETES MELLITUS, TYPE II   HYPERLIPIDEMIA   1. Possible TIA-agent has bicipital tendon insertion tenderness over the right bicipital groove. This may be what is causing her pain. Nonetheless she does have some risk factors that are concerning for possible TIA and cannot definitively tell me one way or the other whether pain was limiting movement of the arm or whether the feeling in her arm was numbness-I suspect that this will need to be ruled out by TIA workup which has been ordered. Urology has been consulted in the emergency room-no TPA given as patient said of window 2. Hypertension-given she has no residual symptoms, we'll continue blood pressure medicines as per home regimen of diastolic 20 mg daily, lisinopril 40 daily 3. Diabetes mellitus-continue Glucophage 500 mg every 24 hourly 4. Sleep apnea-we will order CPAP as per RT 5. History COPD-continue albuterol 2 puffs every 6 when necessary 6. History diabetic neuropathy continue Lyrica 100 mg each bedtime 7. GERD continue famotidine 20 mg daily at bedtime as well as Prilosec20 daily necessary  Neurology consulted by emergency room physician   Code Status: Full code  Family Communication: None present at bedside  intient status telemetry Time spent: 50 Mahala Menghini [facility] Pager [phone]  If 7PM-7AM, please contact night-coverage [email or web address] Password [facility] 06/27/2012, 7:58 AM

## 2012-06-27 NOTE — ED Notes (Addendum)
PT. REPORTS RIGHT SIDE HEADACHE RADIATING TO RIGHT EYE / LEFT NECK PAIN  , PT. ALSO REPORTS RIGHT UPPER THIGH MUSCLE SPASMS YESTERDAY / RIGHT ARM WEAKNESS YESTERDAY MORNING / UNSTEADY GAIT 2 DAYS AGO  , AMBULATORY AT ARRIVAL  , ALERT / ORIENTED , SPEECH CLEAR , NO FACIAL ASYMMETRY , EQUAL GRIPS WITH NO ARM DRIFT. RESPIRATIONS UNLABORED.

## 2012-06-28 DIAGNOSIS — R51 Headache: Secondary | ICD-10-CM | POA: Diagnosis not present

## 2012-06-28 LAB — LIPID PANEL
Cholesterol: 154 mg/dL (ref 0–200)
HDL: 75 mg/dL (ref >39)
Total CHOL/HDL Ratio: 2.1 RATIO
Triglycerides: 98 mg/dL (ref <150)

## 2012-06-28 LAB — GLUCOSE, CAPILLARY

## 2012-06-28 MED ORDER — CLOPIDOGREL BISULFATE 75 MG PO TABS: 75.0000 mg | ORAL_TABLET | Freq: Every day | ORAL | Status: DC

## 2012-06-28 MED ORDER — ACETAMINOPHEN 325 MG PO TABS
650.0000 mg | ORAL_TABLET | Freq: Four times a day (QID) | ORAL | Status: DC | PRN
  Administered 2012-06-28: 650 mg via ORAL
  Filled 2012-06-28: qty 2

## 2012-06-28 NOTE — Evaluation (Signed)
Physical Therapy Evaluation Patient Details Name: Samantha [name] MRN: [id number] DOB: [birth year] Today's Date: 06/28/2012 Time: 7829-5621 PT Time Calculation (min): 21 min  PT Assessment / Plan / Recommendation Clinical Impression  pt is 72 y/o female admitted for right sided numbness/tingling and to r/o stroke.  CT negative for stroke.  Pt reports sleeping on right shoulder and feeling numbness and unable to move.  MD requested PT for further shoulder assessment.  Pt with limited active and PROM in right shoulder flexion, internal and external rotation.  Pt also reports a hx of neck problems but unclear of dx.  Pt present with possbile right shoulder impingement vs tendinitis.  Spoke with MD and in agreement with OPPT for further assessment.  Will continue to follow for acute PT services for ambulation, DME instructions and continue overall strengthening.  I    PT Assessment  Patient needs continued PT services    Follow Up Recommendations  Outpatient PT    Barriers to Discharge None      Equipment Recommendations  None recommended by PT    Frequency Min 3X/week    Precautions / Restrictions Precautions Precautions: None   Pertinent Vitals/Pain 3/10 right shoulder pain      Mobility  Transfers Transfers: Sit to Stand;Stand to Sit Sit to Stand: 7: Independent;From toilet;From bed Stand to Sit: 7: Independent;To bed Ambulation/Gait Ambulation/Gait Assistance: 4: Min guard Ambulation Distance (Feet): 175 Feet Assistive device: Straight cane Ambulation/Gait Assistance Details: Minguard for safety with occasional use of hand rail for balance Gait Pattern: Step-to pattern;Decreased stride length;Shuffle;Antalgic Gait velocity: decreased due to pain Stairs: No    Exercises     PT Diagnosis: Abnormality of gait;Acute pain  PT Problem List: Decreased strength;Decreased range of motion;Decreased activity tolerance;Decreased balance;Decreased knowledge of use of  DME;Decreased mobility;Impaired sensation;Pain PT Treatment Interventions: DME instruction;Gait training;Functional mobility training;Therapeutic activities;Therapeutic exercise;Balance training;Cognitive remediation;Patient/family education   PT Goals Acute Rehab PT Goals PT Goal Formulation: With patient Time For Goal Achievement: 07/05/12 Potential to Achieve Goals: Good Pt will Ambulate: >150 feet;with modified independence;with least restrictive assistive device PT Goal: Ambulate - Progress: Goal set today Pt will Go Up / Down Stairs: 3-5 stairs;with modified independence;with rail(s) (uses backdoor steps at home) PT Goal: Up/Down Stairs - Progress: Goal set today  Visit Information  Last PT Received On: 06/28/12 Assistance Needed: +1    Subjective Data  Subjective: "I have a lot joint pain all over." Patient Stated Goal: To go home and improve Right shoulder pain   Prior Functioning  Home Living Lives With: Spouse Available Help at Discharge: Friend(s);Available 24 hours/day;Family Type of Home: House Home Access: Ramped entrance Home Layout: One level Bathroom Shower/Tub: Engineer, manufacturing systems: Standard Bathroom Accessibility: Yes How Accessible: Accessible via wheelchair;Accessible via walker Home Adaptive Equipment: Bedside commode/3-in-1;Shower chair with back;Shower chair without back;Walker - rolling;Straight cane (lift) Additional Comments: 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 and needs lift to get in/out of bed Prior Function Level of Independence: Independent with assistive device(s) (Uses SPC vs RW) Able to Take Stairs?: Yes Driving: Yes Vocation: Retired Musician: No difficulties Dominant Hand: Right    Cognition  Cognition Arousal/Alertness: Awake/alert Behavior During Therapy: WFL for tasks assessed/performed Overall Cognitive Status: Within Functional Limits for tasks assessed    Extremity/Trunk  Assessment Right Upper Extremity Assessment RUE ROM/Strength/Tone: Deficits;Unable to fully assess;Due to pain RUE ROM/Strength/Tone Deficits: Limited AROM and PROM due to anterior shoulder pain; Pt goes through ~50% of ROM.  RUE Sensation: Deficits RUE Sensation Deficits: numbness and decrease sensation to light touch compared to left side Left Upper Extremity Assessment LUE ROM/Strength/Tone: WFL for tasks assessed Right Lower Extremity Assessment RLE ROM/Strength/Tone: Within functional levels (However pt reports arthritic pain in hip and knee) RLE Sensation: WFL - Light Touch RLE Coordination: WFL - gross/fine motor Left Lower Extremity Assessment LLE ROM/Strength/Tone: Within functional levels (however reports arthritic pain in left ankle) LLE Sensation: WFL - Light Touch LLE Coordination: WFL - gross/fine motor   Balance    End of Session PT - End of Session Equipment Utilized During Treatment: Gait belt Activity Tolerance: Patient tolerated treatment well Patient left: in bed;with call bell/phone within reach (sitting EOB) Nurse Communication: Mobility status  GP     Tiarra Anastacio 06/28/2012, 10:42 AM Jake Shark, PT DPT [phone]

## 2012-06-28 NOTE — Discharge Summary (Signed)
Physician Discharge Summary  Samantha [name] ZOX:[id number] DOB: [birth year] DOA: 06/27/2012  PCP: Willow Ora, MD  Admit date: 06/27/2012 Discharge date: 06/28/2012  Time spent: 40 minutes  Recommendations for Outpatient Follow-up:  1. Continue Plavix until reviewed by Neurology 2. Consider out-patient re-evaluation for abnormalities found on MRI  recommended to follow up orthopedic surgeon  c Dr. Malon Kindle for further evaluation of neck pain   Discharge Diagnoses:  Principal Problem:   TIA (transient ischemic attack) Active Problems:   DIABETES MELLITUS, TYPE II   HYPERLIPIDEMIA   Discharge Condition:good  Diet recommendation: Heart healthy diabetic  Filed Weights   06/27/12 1000  Weight: 86.183 kg (190 lb)    History of present illness:  72 year old female admitted 5/30 with potential TIA. She has history of overall muscular spasm secondary to fibromyalgia and takes care of her quadriplegic husband who is total care. She has to move him around and help maneuver hem as he also severe scoliosis and she's been trying to with this. She awoke on the morning of 529 with her right arm feeling like it was dead. She could not raise her hand up to the side and could not raise the head, hair no other focal neurological deficit other than seeing some spots in her eyes and she does have a history of migraine headaches as well She was ultimately ruled out for an acute stroke but it was thought that she might have a TIA Her MRI of the brain was done which showed scattered subcortical T2 hyperintensities greater than expected for age which are nonspecific but could be related to, MS, vasculitis, complicated migraine, microvascular ischemia which is chronic She was recommended to change from aspirin to Plavix a neurology He has been recommended followup with her regular orthopedic surgeon because she does have history of disc disease status post ACDF and is seen by them chronically. Physical  therapy occupational therapy to see her before she leaves to help with therapy  Carotid Dopplers were negative, MRI as above  echocardiogram part deferred as she had 1 in March of this year  Consultations:  neurology  Discharge Exam: Filed Vitals:   06/27/12 2200 06/28/12 0000 06/28/12 0400 06/28/12 0821  BP: 140/76 134/72 134/69 114/74  Pulse: 65 69 57 61  Temp: 98 F (36.7 C) 98.2 F (36.8 C) 97.6 F (36.4 C) 98.9 F (37.2 C)  TempSrc:    Oral  Resp:  18 18 18   Height:      Weight:      SpO2: 99% 99% 97% 97%   pleasant alert oriented has a mild headache  General: Alert no icterus or pallor Cardiovascular: S1-S2 no murmur rub or gallop Respiratory: Clinically clear  Discharge Instructions  Discharge Orders   Future Appointments Provider Department Dept Phone   08/05/2012 9:15 AM Wanda Plump, MD [facility] [phone]   Future Orders Complete By Expires     Call MD for:  difficulty breathing, headache or visual disturbances  As directed     Call MD for:  persistant dizziness or light-headedness  As directed     Call MD for:  redness, tenderness, or signs of infection (pain, swelling, redness, odor or green/yellow discharge around incision site)  As directed     Call MD for:  temperature >100.4  As directed     Diet - low sodium heart healthy  As directed     Discharge instructions  As directed     Comments:  I suspect you have more of a chronic Neck spasm than anything neurological Follow-up with neurology as an out-patient Your ASA has been changed to plavix 75 mg. Continue this until instructed otherwise Follow up with your orthopedic surgeon for your chronic neck pains    Increase activity slowly  As directed         Medication List    STOP taking these medications       aspirin 81 MG tablet      TAKE these medications       albuterol 108 (90 BASE) MCG/ACT inhaler  Commonly known as:  VENTOLIN HFA  Inhale 2 puffs into  the lungs every 6 (six) hours as needed.     CALCIUM PO  Take 1 tablet by mouth daily.     cetirizine 10 MG tablet  Commonly known as:  ZYRTEC  Take 1 tablet (10 mg total) by mouth daily.     clopidogrel 75 MG tablet  Commonly known as:  PLAVIX  Take 1 tablet (75 mg total) by mouth daily with breakfast.     famotidine 20 MG tablet  Commonly known as:  PEPCID  Take 20 mg by mouth at bedtime as needed. For acid reflux     fluticasone 50 MCG/ACT nasal spray  Commonly known as:  FLONASE  Place 2 sprays into the nose daily.     furosemide 20 MG tablet  Commonly known as:  LASIX  Take 20 mg by mouth 2 (two) times daily.     Glucosamine-Chondroitin-MSM 500-200-150 MG Tabs  Take 1 tablet by mouth every Monday, Wednesday, and Friday.     lisinopril 40 MG tablet  Commonly known as:  PRINIVIL,ZESTRIL  Take 1 tablet (40 mg total) by mouth daily.     metFORMIN 500 MG 24 hr tablet  Commonly known as:  GLUCOPHAGE-XR  Take 500 mg by mouth daily with breakfast.     multivitamin capsule  Take 1 capsule by mouth daily.     Nebivolol HCl 20 MG Tabs  Commonly known as:  BYSTOLIC  Take 1 tablet (20 mg total) by mouth daily.     omeprazole 20 MG tablet  Commonly known as:  PRILOSEC OTC  Take 20 mg by mouth daily as needed. Take one 30-60 min before first meal of the day     Potassium 99 MG Tabs  Take 99 mg by mouth daily.     pravastatin 40 MG tablet  Commonly known as:  PRAVACHOL  Take 40 mg by mouth every evening.     pregabalin 100 MG capsule  Commonly known as:  LYRICA  Take 100 mg by mouth at bedtime.       Allergies  Allergen Reactions  . Cefuroxime Axetil     REACTION: urticaria (hives)  . Oxycodone Nausea And Vomiting  . Seldane (Terfenadine)     REACTION: urticaria (hives)  . Tramadol     REACTION: vomitting      The results of significant diagnostics from this hospitalization (including imaging, microbiology, ancillary and laboratory) are listed below for  reference.    Significant Diagnostic Studies: Ct Head (brain) Wo Contrast  06/27/2012   *RADIOLOGY REPORT*  Clinical Data: Right-sided headache, radiating to the right eye and left neck.  Unsteady gait.  CT HEAD WITHOUT CONTRAST  Technique:  Contiguous axial images were obtained from the base of the skull through the vertex without contrast.  Comparison: CT of the cervical spine performed 03/26/2005  Findings: There is no  evidence of acute infarction, mass lesion, or intra- or extra-axial hemorrhage on CT.  The posterior fossa, including the cerebellum, brainstem and fourth ventricle, is within normal limits.  The third and lateral ventricles, and basal ganglia are unremarkable in appearance.  The cerebral hemispheres are symmetric in appearance, with normal gray- white differentiation.  No mass effect or midline shift is seen.  There is no evidence of fracture; visualized osseous structures are unremarkable in appearance.  The orbits are within normal limits. The paranasal sinuses and mastoid air cells are well-aerated.  No significant soft tissue abnormalities are seen.  IMPRESSION: Unremarkable noncontrast CT of the head.   Original Report Authenticated By: Tonia Ghent, M.D.   Mri Brain Without Contrast  06/27/2012   *RADIOLOGY REPORT*  Clinical Data:  Abnormal sensation along the right side of her head with spasms in the right lower extremity since yesterday.  Rule out CVA.  MRI HEAD WITHOUT CONTRAST MRA HEAD WITHOUT CONTRAST  Technique:  Multiplanar, multiecho pulse sequences of the brain and surrounding structures were obtained without intravenous contrast. Angiographic images of the head were obtained using MRA technique without contrast.  Comparison:  CT head 06/27/2012.  MRI HEAD  Findings:  No acute infarct, hemorrhage, or mass lesion is present. Scattered punctate periventricular subcortical T2 hyperintensities are slightly greater than expected for age.  Mild generalized atrophy is within normal  limits for age.  The ventricles are normal size.  No significant extra-axial fluid collection is present.  Flow is present in the major intracranial arteries.  The patient is status post bilateral lens extractions.  The globes and orbits are otherwise intact.  Mild mucosal thickening is present in the right maxillary sinus and bilateral ethmoid air cells.  The remaining paranasal sinuses and the mastoid air cells are clear.  Postsurgical changes are noted in the upper cervical spine with marked degenerative change at C1-2.  IMPRESSION:  1.  Scattered periventricular subcortical T2 hyperintensities are slightly greater than expected for age. The finding is nonspecific but can be seen in the setting of chronic microvascular ischemia, a demyelinating process such as multiple sclerosis, vasculitis, complicated migraine headaches, or as the sequelae of a prior infectious or inflammatory process. 2.  No acute or focal intracranial abnormality to explain the patient's symptoms. 3.  Minimal sinus disease. 4.  Postoperative changes of the upper cervical spine.  MRA HEAD  Findings: The internal carotid arteries are within normal limits from high cervical segments through the ICA termini bilaterally. The A1 and M1 segments are normal.  The anterior communicating artery is patent.  Mild distal small vessel disease is evident bilaterally, left greater than right.  The vertebral arteries are codominant but somewhat small.  The PICA origin is visualized and normal bilaterally.  Both posterior cerebral arteries originate from basilar tip with a small contribution from the left posterior communicating artery.  There is mild attenuation of distal PCA branch vessels.  IMPRESSION:  1.  Mild small vessel disease. 2.  No significant proximal stenosis, aneurysm, or branch vessel occlusion.   Original Report Authenticated By: Marin Roberts, M.D.   Mr Mra Head/brain Wo Cm  06/27/2012   *RADIOLOGY REPORT*  Clinical Data:  Abnormal  sensation along the right side of her head with spasms in the right lower extremity since yesterday.  Rule out CVA.  MRI HEAD WITHOUT CONTRAST MRA HEAD WITHOUT CONTRAST  Technique:  Multiplanar, multiecho pulse sequences of the brain and surrounding structures were obtained without intravenous contrast. Angiographic images of  the head were obtained using MRA technique without contrast.  Comparison:  CT head 06/27/2012.  MRI HEAD  Findings:  No acute infarct, hemorrhage, or mass lesion is present. Scattered punctate periventricular subcortical T2 hyperintensities are slightly greater than expected for age.  Mild generalized atrophy is within normal limits for age.  The ventricles are normal size.  No significant extra-axial fluid collection is present.  Flow is present in the major intracranial arteries.  The patient is status post bilateral lens extractions.  The globes and orbits are otherwise intact.  Mild mucosal thickening is present in the right maxillary sinus and bilateral ethmoid air cells.  The remaining paranasal sinuses and the mastoid air cells are clear.  Postsurgical changes are noted in the upper cervical spine with marked degenerative change at C1-2.  IMPRESSION:  1.  Scattered periventricular subcortical T2 hyperintensities are slightly greater than expected for age. The finding is nonspecific but can be seen in the setting of chronic microvascular ischemia, a demyelinating process such as multiple sclerosis, vasculitis, complicated migraine headaches, or as the sequelae of a prior infectious or inflammatory process. 2.  No acute or focal intracranial abnormality to explain the patient's symptoms. 3.  Minimal sinus disease. 4.  Postoperative changes of the upper cervical spine.  MRA HEAD  Findings: The internal carotid arteries are within normal limits from high cervical segments through the ICA termini bilaterally. The A1 and M1 segments are normal.  The anterior communicating artery is patent.   Mild distal small vessel disease is evident bilaterally, left greater than right.  The vertebral arteries are codominant but somewhat small.  The PICA origin is visualized and normal bilaterally.  Both posterior cerebral arteries originate from basilar tip with a small contribution from the left posterior communicating artery.  There is mild attenuation of distal PCA branch vessels.  IMPRESSION:  1.  Mild small vessel disease. 2.  No significant proximal stenosis, aneurysm, or branch vessel occlusion.   Original Report Authenticated By: Marin Roberts, M.D.    Microbiology: No results found for this or any previous visit (from the past 240 hour(s)).   Labs: Basic Metabolic Panel:  Recent Labs Lab 06/27/12 0325  NA 139  K 3.7  CL 101  CO2 27  GLUCOSE 107*  BUN 20  CREATININE 0.90  CALCIUM 9.7   Liver Function Tests:  Recent Labs Lab 06/27/12 0325  AST 22  ALT 15  ALKPHOS 66  BILITOT 0.4  PROT 7.6  ALBUMIN 4.1   No results found for this basename: LIPASE, AMYLASE,  in the last 168 hours No results found for this basename: AMMONIA,  in the last 168 hours CBC:  Recent Labs Lab 06/27/12 0325  WBC 5.0  NEUTROABS 2.5  HGB 13.0  HCT 37.7  MCV 81.6  PLT 153   Cardiac Enzymes: No results found for this basename: CKTOTAL, CKMB, CKMBINDEX, TROPONINI,  in the last 168 hours BNP: BNP (last 3 results) No results found for this basename: PROBNP,  in the last 8760 hours CBG:  Recent Labs Lab 06/27/12 1305 06/27/12 1624 06/27/12 2058 06/28/12 0731  GLUCAP 106* 120* 107* 110*       Signed:  Rhetta Mura  [facility] 06/28/2012, 8:57 AM

## 2012-06-28 NOTE — Progress Notes (Signed)
Pt discharged to home per MD order. Pt received and reviewed all discharge instructions and medication information including follow-up appointments and prescriptions. Pt also reviewed stroke/TIA discharge education. Pt verbalized understanding. Pt alert and oriented at discharge with no complaints of pain. Pt escorted to private vehicle via wheelchair by nurse tech. Efraim Kaufmann

## 2012-06-28 NOTE — Progress Notes (Signed)
NCM spoke to pt and states she has been to the [facility] on [facility] and prefers to go to that clinic again. Provided pt with phone number and she knows where the clinic is located. Faxed referral to [facility]. Explained clinic will contact her to arrange appt and she should call if she has not heard from them. Isidoro Donning RN CCM Case Mgmt phone [phone]

## 2012-06-28 NOTE — Progress Notes (Signed)
Stroke Team Progress Note  HISTORY   Samantha [name] is an 72 y.o. female who woke up two days ago with sever pain in her tight going area and noted pain in the interior thigh. This eventually resolve but then she started to have " a crawling " sensation on the top of her head. This started in the right occipital region then spread to the right parietal and frontal region. When is stopped she had a headache in the right frontal aspect of the head. This lasted for two days. Today she woke up and noted she could not move her right arm. She rubbed her arm for about 5 minutes and then had full sensation and strength all but returned. Now she is complaining of right shoulder pain causing weakness in her right arm.  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 They start as a dark spot and then progress to larger dark areas. She never has a HA with these. She has never seen a neurologist. She does not take abortive of Px medication for her HA.    SUBJECTIVE Currently only complains of R shoulder pain that is tender to touch.   OBJECTIVE Most recent Vital Signs: Filed Vitals:   06/27/12 2200 06/28/12 0000 06/28/12 0400 06/28/12 0821  BP: 140/76 134/72 134/69 114/74  Pulse: 65 69 57 61  Temp: 98 F (36.7 C) 98.2 F (36.8 C) 97.6 F (36.4 C) 98.9 F (37.2 C)  TempSrc:    Oral  Resp:  18 18 18   Height:      Weight:      SpO2: 99% 99% 97% 97%   CBG (last 3)   Recent Labs  06/27/12 1624 06/27/12 2058 06/28/12 0731  GLUCAP 120* 107* 110*    IV Fluid Intake:     MEDICATIONS  . clopidogrel  75 mg Oral Q breakfast  . furosemide  20 mg Oral BID  . heparin  5,000 Units Subcutaneous Q8H  . lisinopril  40 mg Oral Daily  . metFORMIN  500 mg Oral Q breakfast  . nebivolol  20 mg Oral Daily  . pregabalin  100 mg Oral QHS   PRN:  acetaminophen, famotidine  Diet:  Carb Control  Activity:  Up with assistance DVT Prophylaxis:  scd  CLINICALLY SIGNIFICANT  STUDIES Basic Metabolic Panel:  Recent Labs Lab 06/27/12 0325  NA 139  K 3.7  CL 101  CO2 27  GLUCOSE 107*  BUN 20  CREATININE 0.90  CALCIUM 9.7   Liver Function Tests:  Recent Labs Lab 06/27/12 0325  AST 22  ALT 15  ALKPHOS 66  BILITOT 0.4  PROT 7.6  ALBUMIN 4.1   CBC:  Recent Labs Lab 06/27/12 0325  WBC 5.0  NEUTROABS 2.5  HGB 13.0  HCT 37.7  MCV 81.6  PLT 153   Coagulation: No results found for this basename: LABPROT, INR,  in the last 168 hours Cardiac Enzymes: No results found for this basename: CKTOTAL, CKMB, CKMBINDEX, TROPONINI,  in the last 168 hours Urinalysis: No results found for this basename: COLORURINE, APPERANCEUR, LABSPEC, PHURINE, GLUCOSEU, HGBUR, BILIRUBINUR, KETONESUR, PROTEINUR, UROBILINOGEN, NITRITE, LEUKOCYTESUR,  in the last 168 hours Lipid Panel    Component Value Date/Time   CHOL 154 06/28/2012 0505   TRIG 98 06/28/2012 0505   HDL 75 06/28/2012 0505   CHOLHDL 2.1 06/28/2012 0505   VLDL 20 06/28/2012 0505   LDLCALC 59 06/28/2012 0505   HgbA1C  Lab Results  Component Value Date   HGBA1C 6.1* 06/27/2012    Urine Drug Screen:     Component Value Date/Time   LABOPIA NONE DETECTED 06/27/2012 1605   COCAINSCRNUR NONE DETECTED 06/27/2012 1605   LABBENZ NONE DETECTED 06/27/2012 1605   AMPHETMU NONE DETECTED 06/27/2012 1605   THCU NONE DETECTED 06/27/2012 1605   LABBARB NONE DETECTED 06/27/2012 1605    Alcohol Level: No results found for this basename: ETH,  in the last 168 hours  Ct Head (brain) Wo Contrast  06/27/2012   *RADIOLOGY REPORT*  Clinical Data: Right-sided headache, radiating to the right eye and left neck.  Unsteady gait.  CT HEAD WITHOUT CONTRAST  Technique:  Contiguous axial images were obtained from the base of the skull through the vertex without contrast.  Comparison: CT of the cervical spine performed 03/26/2005  Findings: There is no evidence of acute infarction, mass lesion, or intra- or extra-axial hemorrhage on CT.  The  posterior fossa, including the cerebellum, brainstem and fourth ventricle, is within normal limits.  The third and lateral ventricles, and basal ganglia are unremarkable in appearance.  The cerebral hemispheres are symmetric in appearance, with normal gray- white differentiation.  No mass effect or midline shift is seen.  There is no evidence of fracture; visualized osseous structures are unremarkable in appearance.  The orbits are within normal limits. The paranasal sinuses and mastoid air cells are well-aerated.  No significant soft tissue abnormalities are seen.  IMPRESSION: Unremarkable noncontrast CT of the head.   Original Report Authenticated By: Tonia Ghent, M.D.   Mri Brain Without Contrast  06/27/2012   *RADIOLOGY REPORT*  Clinical Data:  Abnormal sensation along the right side of her head with spasms in the right lower extremity since yesterday.  Rule out CVA.  MRI HEAD WITHOUT CONTRAST MRA HEAD WITHOUT CONTRAST  Technique:  Multiplanar, multiecho pulse sequences of the brain and surrounding structures were obtained without intravenous contrast. Angiographic images of the head were obtained using MRA technique without contrast.  Comparison:  CT head 06/27/2012.  MRI HEAD  Findings:  No acute infarct, hemorrhage, or mass lesion is present. Scattered punctate periventricular subcortical T2 hyperintensities are slightly greater than expected for age.  Mild generalized atrophy is within normal limits for age.  The ventricles are normal size.  No significant extra-axial fluid collection is present.  Flow is present in the major intracranial arteries.  The patient is status post bilateral lens extractions.  The globes and orbits are otherwise intact.  Mild mucosal thickening is present in the right maxillary sinus and bilateral ethmoid air cells.  The remaining paranasal sinuses and the mastoid air cells are clear.  Postsurgical changes are noted in the upper cervical spine with marked degenerative change  at C1-2.  IMPRESSION:  1.  Scattered periventricular subcortical T2 hyperintensities are slightly greater than expected for age. The finding is nonspecific but can be seen in the setting of chronic microvascular ischemia, a demyelinating process such as multiple sclerosis, vasculitis, complicated migraine headaches, or as the sequelae of a prior infectious or inflammatory process. 2.  No acute or focal intracranial abnormality to explain the patient's symptoms. 3.  Minimal sinus disease. 4.  Postoperative changes of the upper cervical spine.  MRA HEAD  Findings: The internal carotid arteries are within normal limits from high cervical segments through the ICA termini bilaterally. The A1 and M1 segments are normal.  The anterior communicating artery is patent.  Mild distal small vessel disease is evident bilaterally, left greater  than right.  The vertebral arteries are codominant but somewhat small.  The PICA origin is visualized and normal bilaterally.  Both posterior cerebral arteries originate from basilar tip with a small contribution from the left posterior communicating artery.  There is mild attenuation of distal PCA branch vessels.  IMPRESSION:  1.  Mild small vessel disease. 2.  No significant proximal stenosis, aneurysm, or branch vessel occlusion.   Original Report Authenticated By: Marin Roberts, M.D.   Mr Mra Head/brain Wo Cm  06/27/2012   *RADIOLOGY REPORT*  Clinical Data:  Abnormal sensation along the right side of her head with spasms in the right lower extremity since yesterday.  Rule out CVA.  MRI HEAD WITHOUT CONTRAST MRA HEAD WITHOUT CONTRAST  Technique:  Multiplanar, multiecho pulse sequences of the brain and surrounding structures were obtained without intravenous contrast. Angiographic images of the head were obtained using MRA technique without contrast.  Comparison:  CT head 06/27/2012.  MRI HEAD  Findings:  No acute infarct, hemorrhage, or mass lesion is present. Scattered punctate  periventricular subcortical T2 hyperintensities are slightly greater than expected for age.  Mild generalized atrophy is within normal limits for age.  The ventricles are normal size.  No significant extra-axial fluid collection is present.  Flow is present in the major intracranial arteries.  The patient is status post bilateral lens extractions.  The globes and orbits are otherwise intact.  Mild mucosal thickening is present in the right maxillary sinus and bilateral ethmoid air cells.  The remaining paranasal sinuses and the mastoid air cells are clear.  Postsurgical changes are noted in the upper cervical spine with marked degenerative change at C1-2.  IMPRESSION:  1.  Scattered periventricular subcortical T2 hyperintensities are slightly greater than expected for age. The finding is nonspecific but can be seen in the setting of chronic microvascular ischemia, a demyelinating process such as multiple sclerosis, vasculitis, complicated migraine headaches, or as the sequelae of a prior infectious or inflammatory process. 2.  No acute or focal intracranial abnormality to explain the patient's symptoms. 3.  Minimal sinus disease. 4.  Postoperative changes of the upper cervical spine.  MRA HEAD  Findings: The internal carotid arteries are within normal limits from high cervical segments through the ICA termini bilaterally. The A1 and M1 segments are normal.  The anterior communicating artery is patent.  Mild distal small vessel disease is evident bilaterally, left greater than right.  The vertebral arteries are codominant but somewhat small.  The PICA origin is visualized and normal bilaterally.  Both posterior cerebral arteries originate from basilar tip with a small contribution from the left posterior communicating artery.  There is mild attenuation of distal PCA branch vessels.  IMPRESSION:  1.  Mild small vessel disease. 2.  No significant proximal stenosis, aneurysm, or branch vessel occlusion.   Original  Report Authenticated By: Marin Roberts, M.D.    CT of the brain  Unremarkable noncontrast CT of the head.   MRI of the brain  Scattered periventricular subcortical T2 hyperintensities, no acute intracranial abnormality  MRA of the brain  No significant proximal stenosis, aneurysm, or branch vessel  occlusion.    2D Echocardiogram    Carotid Doppler    CXR      Physical Exam   Mental Status:  Alert, oriented, thought content appropriate. Speech fluent without evidence of aphasia. Able to follow 3 step commands without difficulty.  Cranial Nerves:  II: Discs flat bilaterally; Visual fields grossly normal, pupils equal, round, reactive to  light and accommodation  III,IV, VI: ptosis not present, extra-ocular motions intact bilaterally  V,VII: smile symmetric, facial light touch sensation normal bilaterally  VIII: hearing normal bilaterally  IX,X: gag reflex present  XI: bilateral shoulder shrug  XII: midline tongue extension  Motor:  Right : Upper extremity 5/5 Left: Upper extremity 5/5  Lower extremity 5/5 Lower extremity 5/5  Tone and bulk:normal tone throughout; no atrophy noted  Sensory: Pinprick and light touch intact throughout, bilaterally--but states she has more sensation on the right arm  Deep Tendon Reflexes: 2+ and symmetric throughout  Plantars:  Right: downgoing Left: downgoing  Cerebellar:  normal finger-to-nose, normal heel-to-shin test  CV: pulses palpable throughout    ASSESSMENT 72 yo F who awoke with right arm weakness and numbness. It is possible that this represents TIA, though also possible that it was that she fell asleep on her arm, but she denies being in a position that would cause this on awakening.    Sensation back to baseline, point tenderness R shoulder.   Hospital day # 1  TREATMENT/PLAN  No signs of ischemia on MRI, MRA no hemodynamic stenosis.   Pt is on daily Plaivx  Can be d/c from Neuro stand point.   Will Sign off  please call with questions.     06/28/2012 9:29 AM Pauletta Browns

## 2012-07-03 ENCOUNTER — Other Ambulatory Visit: Payer: Self-pay | Admitting: Cardiology

## 2012-07-03 ENCOUNTER — Ambulatory Visit: Payer: Medicare Other | Attending: Family Medicine

## 2012-07-03 DIAGNOSIS — R269 Unspecified abnormalities of gait and mobility: Secondary | ICD-10-CM | POA: Insufficient documentation

## 2012-07-03 DIAGNOSIS — R279 Unspecified lack of coordination: Secondary | ICD-10-CM | POA: Diagnosis not present

## 2012-07-03 DIAGNOSIS — IMO0001 Reserved for inherently not codable concepts without codable children: Secondary | ICD-10-CM | POA: Diagnosis not present

## 2012-07-07 ENCOUNTER — Ambulatory Visit: Payer: Medicare Other

## 2012-07-07 DIAGNOSIS — IMO0001 Reserved for inherently not codable concepts without codable children: Secondary | ICD-10-CM | POA: Diagnosis not present

## 2012-07-07 DIAGNOSIS — R279 Unspecified lack of coordination: Secondary | ICD-10-CM | POA: Diagnosis not present

## 2012-07-07 DIAGNOSIS — R269 Unspecified abnormalities of gait and mobility: Secondary | ICD-10-CM | POA: Diagnosis not present

## 2012-07-08 ENCOUNTER — Telehealth: Payer: Self-pay | Admitting: *Deleted

## 2012-07-08 ENCOUNTER — Ambulatory Visit (INDEPENDENT_AMBULATORY_CARE_PROVIDER_SITE_OTHER): Payer: Medicare Other | Admitting: Internal Medicine

## 2012-07-08 ENCOUNTER — Encounter: Payer: Self-pay | Admitting: Internal Medicine

## 2012-07-08 VITALS — BP 136/84 | HR 60 | Temp 98.2°F | Wt 189.0 lb

## 2012-07-08 DIAGNOSIS — M1990 Unspecified osteoarthritis, unspecified site: Secondary | ICD-10-CM

## 2012-07-08 DIAGNOSIS — G459 Transient cerebral ischemic attack, unspecified: Secondary | ICD-10-CM

## 2012-07-08 MED ORDER — RANITIDINE HCL 300 MG PO TABS: 300.0000 mg | ORAL_TABLET | Freq: Every day | ORAL | Status: DC

## 2012-07-08 NOTE — Assessment & Plan Note (Signed)
Also complains of right shoulder pain. Recommend to see orthopedic surgery

## 2012-07-08 NOTE — Progress Notes (Signed)
Subjective:    Patient ID: Samantha [name], female    DOB: [birth year], 72 y.o.   MRN: [id number]  HPI Admitted to the [facility] 06/27/2012 w/  mild headache, right upper extremity transient weakness (~ 5 minutes), right facial numbness. Also had a muscle skeletal pain at R shoulder/arm Initial diagnosis was TIA, admitted for further eval CT of the head negative carotid ultrasound negative Brain MRI : Scattered periventricular subcortical T2 hyperintensities are  slightly greater than expected for age. The finding is nonspecific  but can be seen in the setting of chronic microvascular ischemia, a  demyelinating process such as multiple sclerosis, vasculitis,  complicated migraine headaches, or as the sequelae of a prior  infectious or inflammatory process.  2. No acute or focal intracranial abnormality to explain the  patient's symptoms.  3. Minimal sinus disease.  4. Postoperative changes of the upper cervical spine. Brain MRA : show mild basal small vessel disease.  Was recommended to change from aspirin to Plavix. Cholesterol 154, LDL 59, A1C 6.1. Other chemistries within normal (had a  echocardiogram3-2014, echocardiogram not repeated)  Past Medical History  Diagnosis Date  . OSA (obstructive sleep apnea) 09/2007    dx w/ a sleep study, Rx CPAP, weight loss  . Hyperlipidemia   . Hypertension   . Chest pain, atypical 11/2006    saw cards: neg/normal Holter, ECHo and myoview, admitted w/ CP 4/09 neg enz.and CT chest had a 9 beat NSVT  . Fibromyalgia   . Abnormal CT of the chest 2008    follow up CT, April 2009: unchanged. No f/u suggested   . 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 Shingles 11/2009  . Reactive airway disease   . Vaginal dysplasia   . Vaginal cancer 1994  . Type II diabetes mellitus   . Heart murmur   . CHF (congestive heart failure)   . Pneumonia 2003    "double" (06/27/2012)  . Pneumonia 1990's    "once in one lung" (06/27/2012)  .  History of chronic bronchitis   . Shortness of breath     "can happen at any time" (06/27/2012)  . Asthma   . H/O hiatal hernia   . GERD (gastroesophageal reflux disease)   . Ocular migraine   . TIA (transient ischemic attack) 06/27/2012    "this is my first" (06/27/2012)  . Arthritis     "all over" (06/27/2012)  . Rheumatoid factor positive   . Osteoarthritis      Review of Systems Since she left the hospital she reports the  right upper and lower extremities continue to be weak. 2 days ago had a brief episode of slurred speech, "but I'm not sure if I was simply exhausted", unable to elaborate more. She did have a mechanical fall yesterday at home, she tripped and fell, landed on his right side, since there is sore in the right hip and shoulder. No head or neck pain or injury. No LOC    Objective:   Physical Exam BP 136/84  Pulse 60  Temp(Src) 98.2 F (36.8 C) (Oral)  Wt 189 lb (85.73 kg)  BMI 33.49 kg/m2  SpO2 99%  General -- alert, well-developed, NAD.    Lungs -- normal respiratory effort, no intercostal retractions, no accessory muscle use, and normal breath sounds.   Heart-- normal rate, regular rhythm, no murmur, and no gallop.   Neurologic-- alert & oriented X3 Speech clear , good recollection of events. Face symmetric,  EOMI. DTRs symmetric. Motor exam: subtle weakness on the right arm and right leg. Complaining of pain in the right shoulder during examination. MSK: walks w/ a cane, hip rotation full B Psych-- Cognition and judgment appear intact. Alert and cooperative with normal attention span and concentration.  not anxious appearing and not depressed appearing.       Assessment & Plan:  Today , I spent more than 40 minutes with the patient, >50% of the time counseling, and reviewing the recent complex admission, labs, CT scan and MRI reports.

## 2012-07-08 NOTE — Assessment & Plan Note (Addendum)
Admitted to the hospital with symptoms suggestive of a TIA. Brain MRI it was not completely normal, see full report Since then, had a single episode of slurred speech (description of the symptoms are not very clear). On exam she has a subtle right-sided weakness, exam is difficult  as she has shoulder pain prior to admission and fell yesterday and now is hurting in her right leg. Plan: Continue with Plavix, she already discontinue Prilosec, will prescribe Zantac. We will arrange neurology referral Knows to call or go to the ER if she has severe symptoms Continue with physical therapy. Fall prevention discussed

## 2012-07-08 NOTE — Patient Instructions (Signed)

## 2012-07-09 ENCOUNTER — Telehealth: Payer: Self-pay | Admitting: *Deleted

## 2012-07-09 NOTE — Telephone Encounter (Signed)
Patient and her husband has been calling to be scheduled with Dr. Pearlean Brownie for a TIA follow-up. Moe and I have both checked [facility] and [facility] and noticed that the patient has never been seen by anyone in this practice. She is a new patient that needs a referral and come in through NP coordinator

## 2012-07-09 NOTE — Telephone Encounter (Signed)
Drue Stager is calling pt to see if the patient would prefer to see Dr. Pearlean Brownie on 7/10 instead of seeing Dr. Terrace Arabia on 6/27 for her TIA. Will reschedule if necessary.

## 2012-07-10 ENCOUNTER — Ambulatory Visit: Payer: Medicare Other | Admitting: Rehabilitative and Restorative Service Providers"

## 2012-07-10 DIAGNOSIS — R279 Unspecified lack of coordination: Secondary | ICD-10-CM | POA: Diagnosis not present

## 2012-07-10 DIAGNOSIS — IMO0001 Reserved for inherently not codable concepts without codable children: Secondary | ICD-10-CM | POA: Diagnosis not present

## 2012-07-10 DIAGNOSIS — R269 Unspecified abnormalities of gait and mobility: Secondary | ICD-10-CM | POA: Diagnosis not present

## 2012-07-14 ENCOUNTER — Ambulatory Visit: Payer: Medicare Other | Admitting: Physical Therapy

## 2012-07-14 DIAGNOSIS — R279 Unspecified lack of coordination: Secondary | ICD-10-CM | POA: Diagnosis not present

## 2012-07-14 DIAGNOSIS — R269 Unspecified abnormalities of gait and mobility: Secondary | ICD-10-CM | POA: Diagnosis not present

## 2012-07-14 DIAGNOSIS — IMO0001 Reserved for inherently not codable concepts without codable children: Secondary | ICD-10-CM | POA: Diagnosis not present

## 2012-07-16 ENCOUNTER — Other Ambulatory Visit: Payer: Self-pay | Admitting: Internal Medicine

## 2012-07-17 DIAGNOSIS — M25519 Pain in unspecified shoulder: Secondary | ICD-10-CM | POA: Diagnosis not present

## 2012-07-17 NOTE — Telephone Encounter (Signed)
Refill done.  

## 2012-07-18 ENCOUNTER — Ambulatory Visit: Payer: Medicare Other | Admitting: Physical Therapy

## 2012-07-21 ENCOUNTER — Ambulatory Visit: Payer: Medicare Other

## 2012-07-22 ENCOUNTER — Ambulatory Visit: Payer: Medicare Other | Admitting: Physical Therapy

## 2012-07-22 ENCOUNTER — Telehealth: Payer: Self-pay | Admitting: Internal Medicine

## 2012-07-22 DIAGNOSIS — R279 Unspecified lack of coordination: Secondary | ICD-10-CM | POA: Diagnosis not present

## 2012-07-22 DIAGNOSIS — IMO0001 Reserved for inherently not codable concepts without codable children: Secondary | ICD-10-CM | POA: Diagnosis not present

## 2012-07-22 DIAGNOSIS — R269 Unspecified abnormalities of gait and mobility: Secondary | ICD-10-CM | POA: Diagnosis not present

## 2012-07-22 NOTE — Telephone Encounter (Signed)
Attempted called back at 1618 on 6-24, no vmail set-up.

## 2012-07-23 ENCOUNTER — Telehealth: Payer: Self-pay | Admitting: Internal Medicine

## 2012-07-23 NOTE — Telephone Encounter (Signed)
She doesn't need PT/INR checked with plavix.

## 2012-07-23 NOTE — Telephone Encounter (Signed)
Patient Information:  Caller Name: Samantha [name]  Phone: [phone]  Patient: Samantha [name], Samantha [name]  Gender: Female  DOB: [birth year]  Age: 72 Years  PCP: Willow Ora  Office Follow Up:  Does the office need to follow up with this patient?: No  Instructions For The Office: N/A  RN Note:  Rn transferred the call to the office to schedule PT/INR level checked as well as her elevated BP  Symptoms  Reason For Call & Symptoms: headache, dizzy and elevated BP (144/95).  Pt states she feels better today.  Pt also states that she needs her PT/INR level to be checked since she just began Plavix 06/27/12  Reviewed Health History In EMR: Yes  Reviewed Medications In EMR: Yes  Reviewed Allergies In EMR: Yes  Reviewed Surgeries / Procedures: Yes  Date of Onset of Symptoms: 07/22/2012  Guideline(s) Used:  High Blood Pressure  Disposition Per Guideline:   See Within 2 Weeks in Office  Reason For Disposition Reached:   BP > 140/90 and is taking BP medications  Advice Given:  N/A  Patient Will Follow Care Advice:  YES

## 2012-07-23 NOTE — Telephone Encounter (Signed)
Pt with Pending OV 07-24-12 @ 2:30 pm

## 2012-07-24 ENCOUNTER — Ambulatory Visit (INDEPENDENT_AMBULATORY_CARE_PROVIDER_SITE_OTHER): Payer: Medicare Other | Admitting: Family Medicine

## 2012-07-24 ENCOUNTER — Encounter (HOSPITAL_COMMUNITY): Payer: Self-pay | Admitting: *Deleted

## 2012-07-24 ENCOUNTER — Ambulatory Visit: Payer: Medicare Other | Admitting: Physical Therapy

## 2012-07-24 ENCOUNTER — Emergency Department (HOSPITAL_COMMUNITY): Payer: Medicare Other

## 2012-07-24 ENCOUNTER — Emergency Department (HOSPITAL_COMMUNITY)
Admission: EM | Admit: 2012-07-24 | Discharge: 2012-07-24 | Disposition: A | Discharge status: Home / Self Care | Payer: Medicare Other | Attending: Emergency Medicine | Admitting: Emergency Medicine

## 2012-07-24 VITALS — BP 110/70 | HR 84 | Temp 98.3°F | Wt 187.0 lb

## 2012-07-24 DIAGNOSIS — J45909 Unspecified asthma, uncomplicated: Secondary | ICD-10-CM | POA: Insufficient documentation

## 2012-07-24 DIAGNOSIS — Z87891 Personal history of nicotine dependence: Secondary | ICD-10-CM | POA: Insufficient documentation

## 2012-07-24 DIAGNOSIS — Z79899 Other long term (current) drug therapy: Secondary | ICD-10-CM | POA: Diagnosis not present

## 2012-07-24 DIAGNOSIS — Z8544 Personal history of malignant neoplasm of other female genital organs: Secondary | ICD-10-CM | POA: Insufficient documentation

## 2012-07-24 DIAGNOSIS — E785 Hyperlipidemia, unspecified: Secondary | ICD-10-CM | POA: Insufficient documentation

## 2012-07-24 DIAGNOSIS — I10 Essential (primary) hypertension: Secondary | ICD-10-CM | POA: Insufficient documentation

## 2012-07-24 DIAGNOSIS — IMO0001 Reserved for inherently not codable concepts without codable children: Secondary | ICD-10-CM | POA: Diagnosis not present

## 2012-07-24 DIAGNOSIS — Z8673 Personal history of transient ischemic attack (TIA), and cerebral infarction without residual deficits: Secondary | ICD-10-CM | POA: Diagnosis not present

## 2012-07-24 DIAGNOSIS — Z8719 Personal history of other diseases of the digestive system: Secondary | ICD-10-CM | POA: Insufficient documentation

## 2012-07-24 DIAGNOSIS — E119 Type 2 diabetes mellitus without complications: Secondary | ICD-10-CM | POA: Diagnosis not present

## 2012-07-24 DIAGNOSIS — R011 Cardiac murmur, unspecified: Secondary | ICD-10-CM | POA: Insufficient documentation

## 2012-07-24 DIAGNOSIS — R0789 Other chest pain: Secondary | ICD-10-CM | POA: Diagnosis not present

## 2012-07-24 DIAGNOSIS — Z8709 Personal history of other diseases of the respiratory system: Secondary | ICD-10-CM | POA: Insufficient documentation

## 2012-07-24 DIAGNOSIS — Z8669 Personal history of other diseases of the nervous system and sense organs: Secondary | ICD-10-CM | POA: Insufficient documentation

## 2012-07-24 DIAGNOSIS — G459 Transient cerebral ischemic attack, unspecified: Secondary | ICD-10-CM | POA: Diagnosis not present

## 2012-07-24 DIAGNOSIS — M25569 Pain in unspecified knee: Secondary | ICD-10-CM

## 2012-07-24 DIAGNOSIS — Z8739 Personal history of other diseases of the musculoskeletal system and connective tissue: Secondary | ICD-10-CM | POA: Diagnosis not present

## 2012-07-24 DIAGNOSIS — I509 Heart failure, unspecified: Secondary | ICD-10-CM | POA: Diagnosis not present

## 2012-07-24 DIAGNOSIS — Z8619 Personal history of other infectious and parasitic diseases: Secondary | ICD-10-CM | POA: Diagnosis not present

## 2012-07-24 DIAGNOSIS — Z8701 Personal history of pneumonia (recurrent): Secondary | ICD-10-CM | POA: Diagnosis not present

## 2012-07-24 DIAGNOSIS — R002 Palpitations: Secondary | ICD-10-CM

## 2012-07-24 DIAGNOSIS — Z8742 Personal history of other diseases of the female genital tract: Secondary | ICD-10-CM | POA: Diagnosis not present

## 2012-07-24 DIAGNOSIS — K219 Gastro-esophageal reflux disease without esophagitis: Secondary | ICD-10-CM | POA: Insufficient documentation

## 2012-07-24 DIAGNOSIS — R269 Unspecified abnormalities of gait and mobility: Secondary | ICD-10-CM | POA: Diagnosis not present

## 2012-07-24 DIAGNOSIS — R279 Unspecified lack of coordination: Secondary | ICD-10-CM | POA: Diagnosis not present

## 2012-07-24 DIAGNOSIS — R079 Chest pain, unspecified: Secondary | ICD-10-CM | POA: Diagnosis not present

## 2012-07-24 DIAGNOSIS — M25561 Pain in right knee: Secondary | ICD-10-CM

## 2012-07-24 LAB — BASIC METABOLIC PANEL
BUN: 21 mg/dL (ref 6–23)
Creatinine, Ser: 0.9 mg/dL (ref 0.50–1.10)
GFR calc non Af Amer: 63 mL/min — ABNORMAL LOW (ref >90)
Glucose, Bld: 164 mg/dL — ABNORMAL HIGH (ref 70–99)
Potassium: 3.8 mEq/L (ref 3.5–5.1)

## 2012-07-24 LAB — CBC
HCT: 40.1 % (ref 36.0–46.0)
Hemoglobin: 14 g/dL (ref 12.0–15.0)
MCH: 28.7 pg (ref 26.0–34.0)
MCHC: 34.9 g/dL (ref 30.0–36.0)
MCV: 82.2 fL (ref 78.0–100.0)

## 2012-07-24 LAB — PROTIME-INR: INR: 0.97 (ref 0.00–1.49)

## 2012-07-24 NOTE — ED Notes (Signed)
Pt reports CP that started 3pm today.  Reports SOB.  States that the pain was shooting across her chest.  Pt reports that she also had palpitations in the middle of the night last night.  Pt A/O x 4. No distress noted.  Speaking in complete sentences.

## 2012-07-24 NOTE — Patient Instructions (Addendum)
Clopidogrel tablets What is this medicine? CLOPIDOGREL (kloh PID oh grel) helps to prevent blood clots. This medicine is used to prevent heart attack, stroke, or other vascular events in people who are at high risk. This medicine may be used for other purposes; ask your health care provider or pharmacist if you have questions. What should I tell my health care provider before I take this medicine? They need to know if you have any of the following conditions: -bleeding disorder -bleeding in the brain -planned surgery -stomach or intestinal ulcers -stroke or transient ischemic attack -an unusual or allergic reaction to clopidogrel, other medicines, foods, dyes, or preservatives -pregnant or trying to get pregnant -breast-feeding How should I use this medicine? Take this medicine by mouth with a drink of water. Follow the directions on the prescription label. You may take this medicine with or without food. Take your medicine at regular intervals. Do not take your medicine more often than directed. Talk to your pediatrician regarding the use of this medicine in children. Special care may be needed. Overdosage: If you think you have taken too much of this medicine contact a poison control center or emergency room at once. NOTE: This medicine is only for you. Do not share this medicine with others. What if I miss a dose? If you miss a dose, take it as soon as you can. If it is almost time for your next dose, take only that dose. Do not take double or extra doses. What may interact with this medicine? -aspirin -blood thinners like cilostazol, enoxaparin, ticlopidine, and warfarin -certain medicines for depression like citalopram, fluoxetine, and fluvoxamine -certain medicines for fungal infections like ketoconazole, fluconazole, and voriconazole -certain medicines for HIV infection like delavirdine, efavirenz, and etravirine -certain medicines for seizures like felbamate, oxcarbazepine, and  phenytoin -chloramphenicol -fluvastatin -isoniazid, INH -medicines for inflammation like ibuprofen and naproxen -modafinil -nicardipine -over-the counter supplements like echinacea, feverfew, fish oil, garlic, ginger, ginkgo, green tea, horse chestnut -quinine -stomach acid blockers like cimetidine, omeprazole, and esomeprazole -tamoxifen -tolbutamide -topiramate -torsemide This list may not describe all possible interactions. Give your health care provider a list of all the medicines, herbs, non-prescription drugs, or dietary supplements you use. Also tell them if you smoke, drink alcohol, or use illegal drugs. Some items may interact with your medicine. What should I watch for while using this medicine? Visit your doctor or health care professional for regular check ups. Do not stop taking your medicine unless your doctor tells you to. If you are going to have surgery or dental work, tell your doctor or health care professional that you are taking this medicine. Certain genetic factors may reduce the effect of this medicine. Your doctor may use genetic tests to determine treatment. What side effects may I notice from receiving this medicine? Side effects that you should report to your doctor or health care professional as soon as possible: -allergic reactions like skin rash, itching or hives, swelling of the face, lips, or tongue -black, tarry stools -blood in urine or vomit -breathing problems -changes in vision -fever -sudden weakness -unusual bleeding or bruising Side effects that usually do not require medical attention (report to your doctor or health care professional if they continue or are bothersome): -constipation or diarrhea -headache -pain in back or joints -stomach upset This list may not describe all possible side effects. Call your doctor for medical advice about side effects. You may report side effects to FDA at 1-800-FDA-1088. Where should I keep my  medicine?  Keep out of the reach of children. Store at room temperature of 59 to 86 degrees F (15 to 30 degrees C). Throw away any unused medicine after the expiration date. NOTE: This sheet is a summary. It may not cover all possible information. If you have questions about this medicine, talk to your doctor, pharmacist, or health care provider.  2013, [employer]. (07/06/2008 9:41:49 AM)

## 2012-07-24 NOTE — ED Provider Notes (Signed)
History    CSN: [id number] Arrival date & time 07/24/12  1726  First MD Initiated Contact with Patient 07/24/12 1739     Chief Complaint  Patient presents with  . Chest Pain   (Consider location/radiation/quality/duration/timing/severity/associated sxs/prior Treatment) HPI Comments: Patient presents to the emergency department for evaluation of chest pain. Patient reports that she had an episode of chest pain last night that she thought was indigestion. It lasted for about 5 minutes and then resolved. She reports that she woke up this morning around 3:30 AM with a racing heartbeat. She says she can feel her heart pounding in her chest, but there was no associated pain. She says that lasted most of the night, then resolved this morning. This afternoon she had onset of another episode of the discomfort in the center of her chest, which again lasting for approximately 5 minutes and then resolves. At arrival to the ER, all symptoms are gone.  Patient is a 72 y.o. female presenting with chest pain.  Chest Pain Associated symptoms: palpitations    Past Medical History  Diagnosis Date  . OSA (obstructive sleep apnea) 09/2007    dx w/ a sleep study, Rx CPAP, weight loss  . Hyperlipidemia   . Hypertension   . Chest pain, atypical 11/2006    saw cards: neg/normal Holter, ECHo and myoview, admitted w/ CP 4/09 neg enz.and CT chest had a 9 beat NSVT  . Fibromyalgia   . Abnormal CT of the chest 2008    follow up CT, April 2009: unchanged. No f/u suggested   . 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 Shingles 11/2009  . Reactive airway disease   . Vaginal dysplasia   . Vaginal cancer 1994  . Type II diabetes mellitus   . Heart murmur   . CHF (congestive heart failure)   . Pneumonia 2003    "double" (06/27/2012)  . Pneumonia 1990's    "once in one lung" (06/27/2012)  . History of chronic bronchitis   . Shortness of breath     "can happen at any time" (06/27/2012)  . Asthma    . H/O hiatal hernia   . GERD (gastroesophageal reflux disease)   . Ocular migraine   . TIA (transient ischemic attack) 06/27/2012    "this is my first" (06/27/2012)  . Arthritis     "all over" (06/27/2012)  . Rheumatoid factor positive   . Osteoarthritis    Past Surgical History  Procedure Laterality Date  . Thyroidectomy, partial  1960's  . Bunionectomy Left ~ 1977  . Breast biopsy Right 1999  . Anterior cervical decomp/discectomy fusion  2001  . Cataract extraction w/ intraocular lens  implant, bilateral  2012  . Vaginal mass excision  1994    "Laser surgery for vaginal cancer; followed by chemotherapy" (06/27/2012)  . Abdominal hysterectomy  1980   Family History  Problem Relation Age of Onset  . Allergies Sister   . Asthma Sister   . Asthma Paternal Grandmother   . Heart disease Father   . Heart disease Mother   . Heart disease      paternal grandparents, maternal grandparents,   . Heart disease Brother   . Lung cancer Mother   . Breast cancer Neg Hx   . Colon cancer Neg Hx    History  Substance Use Topics  . Smoking status: Former Smoker    Types: Cigarettes  . Smokeless tobacco: Never Used  Comment: 06/27/2012 "rarely smokied; smoked occasionally when I did; stopped ~ 2001"  . Alcohol Use: No   OB History   Grav Para Term Preterm Abortions TAB SAB Ect Mult Living                 Review of Systems  Cardiovascular: Positive for chest pain and palpitations.  All other systems reviewed and are negative.    Allergies  Cefuroxime axetil; Oxycodone; Seldane; and Tramadol  Home Medications   Current Outpatient Rx  Name  Route  Sig  Dispense  Refill  . albuterol (VENTOLIN HFA) 108 (90 BASE) MCG/ACT inhaler   Inhalation   Inhale 2 puffs into the lungs every 6 (six) hours as needed.   18 g   2   . BYSTOLIC 20 MG TABS      TAKE 1 TABLET (20 MG TOTAL) BY MOUTH DAILY.   30 tablet   3   . CALCIUM PO   Oral   Take 1 tablet by mouth daily.            . cetirizine (ZYRTEC) 10 MG tablet   Oral   Take 1 tablet (10 mg total) by mouth daily.   90 tablet   1   . clopidogrel (PLAVIX) 75 MG tablet   Oral   Take 1 tablet (75 mg total) by mouth daily with breakfast.   30 tablet   12   . famotidine (PEPCID) 20 MG tablet   Oral   Take 20 mg by mouth at bedtime as needed. For acid reflux         . fluticasone (FLONASE) 50 MCG/ACT nasal spray   Nasal   Place 2 sprays into the nose daily.   1 g   6   . furosemide (LASIX) 20 MG tablet   Oral   Take 20 mg by mouth 2 (two) times daily.         . Glucosamine-Chondroitin-MSM 500-200-150 MG TABS   Oral   Take 1 tablet by mouth every Monday, Wednesday, and Friday.          [name] lisinopril (PRINIVIL,ZESTRIL) 40 MG tablet   Oral   Take 1 tablet (40 mg total) by mouth daily.   30 tablet   11   . LYRICA 100 MG capsule      TAKE ONE CAPSULE BY MOUTH AT BEDTIME AS NEEDED   30 capsule   1   . metFORMIN (GLUCOPHAGE-XR) 500 MG 24 hr tablet   Oral   Take 500 mg by mouth daily with breakfast.         . Multiple Vitamin (MULTIVITAMIN) capsule   Oral   Take 1 capsule by mouth daily.           . Potassium 99 MG TABS   Oral   Take 99 mg by mouth daily.          . pravastatin (PRAVACHOL) 40 MG tablet   Oral   Take 40 mg by mouth every evening.         . ranitidine (ZANTAC) 300 MG tablet   Oral   Take 1 tablet (300 mg total) by mouth at bedtime.   30 tablet   6    BP 132/80  Pulse 69  Temp(Src) 97.8 F (36.6 C) (Oral)  Resp 18  SpO2 99% Physical Exam  Constitutional: She is oriented to person, place, and time. She appears well-developed and well-nourished. No distress.  HENT:  Head: Normocephalic and atraumatic.  Right Ear: Hearing normal.  Left Ear: Hearing normal.  Nose: Nose normal.  Mouth/Throat: Oropharynx is clear and moist and mucous membranes are normal.  Eyes: Conjunctivae and EOM are normal. Pupils are equal, round, and reactive to light.  Neck:  Normal range of motion. Neck supple.  Cardiovascular: Regular rhythm, S1 normal and S2 normal.  Exam reveals no gallop and no friction rub.   No murmur heard. Pulmonary/Chest: Effort normal and breath sounds normal. No respiratory distress. She exhibits no tenderness.  Abdominal: Soft. Normal appearance and bowel sounds are normal. There is no hepatosplenomegaly. There is no tenderness. There is no rebound, no guarding, no tenderness at McBurney's point and negative Murphy's sign. No hernia.  Musculoskeletal: Normal range of motion.  Neurological: She is alert and oriented to person, place, and time. She has normal strength. No cranial nerve deficit or sensory deficit. Coordination normal. GCS eye subscore is 4. GCS verbal subscore is 5. GCS motor subscore is 6.  Skin: Skin is warm, dry and intact. No rash noted. No cyanosis.  Psychiatric: She has a normal mood and affect. Her speech is normal and behavior is normal. Thought content normal.    ED Course  Procedures (including critical care time)  EKG:  Date: 07/24/2012  Rate: 66  Rhythm: normal sinus rhythm  QRS Axis: left  Intervals: normal  ST/T Wave abnormalities: normal  Conduction Disutrbances:none  Narrative Interpretation:   Old EKG Reviewed: unchanged    Labs Reviewed  BASIC METABOLIC PANEL - Abnormal; Notable for the following:    Glucose, Bld 164 (*)    GFR calc non Af Amer 63 (*)    GFR calc Af Amer 73 (*)    All other components within normal limits  CBC  PRO B NATRIURETIC PEPTIDE  PROTIME-INR  POCT I-STAT TROPONIN I   Dg Chest 2 View  07/24/2012   *RADIOLOGY REPORT*  Clinical Data: Chest pain for 2 days.  CHF.  CHEST - 2 VIEW  Comparison: 05/04/2011.  Findings: Mild cardiomegaly.    No infiltrates or failure.  No effusion or pneumothorax.  Tortuous aorta. Degenerative change thoracic spine.  Prior cervical fusion.  IMPRESSION: No active cardiopulmonary disease.  Cardiomegaly.  Stable from priors.   Original Report  Authenticated By: Davonna Belling, M.D.   Diagnosis: 1. Atypical chest pain 2. Palpitations  MDM  Patient presented for evaluation of chest pain. Patient has had 2 separate episodes of nonexertional chest pain that lasted for 5 minutes each. She also had an episode of prolonged heart palpitations this morning. She cannot tell me how fast her heart was going and there was no pain associated with the increased heart rate. The chest pain was very atypical. I do not feel there is any consideration for acute coronary syndrome. Cardiac workup here in the ER was unremarkable. I feel the patient is appropriate for outpatient follow up with primary doctor. She was told to come back to you for any continued chest pain. She also was told to come back to the ER immediately for any increased palpitations.   Gilda Crease, MD 07/24/12 2017

## 2012-07-24 NOTE — Progress Notes (Addendum)
  Subjective:    Patient ID: LAVIDA PATCH, female    DOB: 04/12/1940, 72 y.o.   MRN: [id number]  HPI Pt here s/p TIA 5/30---  She was feeling great when she got home then she started getting dizzy again and weak.  Pt has appointment with neurology tomorrow.  Pt is on plavix.   Review of Systems     Objective:   Physical Exam        Assessment & Plan:   Subjective:     JAYNI PRESCHER is a 72 y.o. female who presents for evaluation of dizziness. The symptoms started a few days after leaving the hospital - ago and are ongoing. The attacks occur intermittently .[name] Positions that worsen symptoms: any motion. Previous workup/treatments: CT scan of brain and MRI of the brain. Associated ear symptoms: none. Associated CNS symptoms: none. Recent infections: none. Head trauma: denied. Drug ingestion: none. Noise exposure: no occupational exposure. Family history: non-contributory.o  Pt just d/c'd from hospital for cva. Pt also c/o R knee pain.  It is popping a lot.  Symptoms x several weeks.  No known injury.   The following portions of the patient's history were reviewed and updated as appropriate: allergies, current medications, past family history, past medical history, past social history, past surgical history and problem list.  Review of Systems Pertinent items are noted in HPI.    Objective:    BP 110/70  Pulse 84  Temp(Src) 98.3 F (36.8 C) (Oral)  Wt 187 lb (84.823 kg)  BMI 33.13 kg/m2  SpO2 97% General appearance: alert, cooperative, appears stated age and no distress Eyes: conjunctivae/corneas clear. PERRL, EOM's intact. Fundi benign. Ears: normal TM's and external ear canals both ears Nose: Nares normal. Septum midline. Mucosa normal. No drainage or sinus tenderness. Throat: lips, mucosa, and tongue normal; teeth and gums normal Neck: no adenopathy, no carotid bruit, no JVD, supple, symmetrical, trachea midline and thyroid not enlarged, symmetric, no  tenderness/mass/nodules Lungs: clear to auscultation bilaterally Heart: S1, S2 normal Neurologic: Motor: + weakness R side -- no change from hospital per pt     L knee--  Popping , no crepitus , no swelling Assessment:    Vertigo and s/p tia    Plan:    MRI head. Educational materials given and questions answered. pt to see neuro tomorrow-- if symptoms worsen --go to er

## 2012-07-25 ENCOUNTER — Encounter: Payer: Self-pay | Admitting: Neurology

## 2012-07-25 ENCOUNTER — Ambulatory Visit (HOSPITAL_COMMUNITY)
Admission: RE | Admit: 2012-07-25 | Discharge: 2012-07-25 | Disposition: A | Discharge status: Home / Self Care | Payer: Medicare Other | Source: Ambulatory Visit | Attending: Family Medicine | Admitting: Family Medicine

## 2012-07-25 ENCOUNTER — Ambulatory Visit (INDEPENDENT_AMBULATORY_CARE_PROVIDER_SITE_OTHER): Payer: Medicare Other | Admitting: Neurology

## 2012-07-25 VITALS — BP 150/83 | HR 61 | Ht 62.5 in | Wt 183.0 lb

## 2012-07-25 DIAGNOSIS — I10 Essential (primary) hypertension: Secondary | ICD-10-CM | POA: Diagnosis not present

## 2012-07-25 DIAGNOSIS — I6789 Other cerebrovascular disease: Secondary | ICD-10-CM | POA: Diagnosis not present

## 2012-07-25 DIAGNOSIS — G459 Transient cerebral ischemic attack, unspecified: Secondary | ICD-10-CM

## 2012-07-25 DIAGNOSIS — R51 Headache: Secondary | ICD-10-CM | POA: Insufficient documentation

## 2012-07-25 DIAGNOSIS — R262 Difficulty in walking, not elsewhere classified: Secondary | ICD-10-CM | POA: Insufficient documentation

## 2012-07-25 DIAGNOSIS — E119 Type 2 diabetes mellitus without complications: Secondary | ICD-10-CM | POA: Diagnosis not present

## 2012-07-25 DIAGNOSIS — R079 Chest pain, unspecified: Secondary | ICD-10-CM

## 2012-07-25 NOTE — Progress Notes (Signed)
[facility]  PATIENT: Samantha [name] DOB: January 14, 1941   HISTORY FROM: patient, chart REASON FOR VISIT: hospital followup   HISTORICAL  Samantha [name] is an 72 y.o. Right-handed AA female who woke up on the morning of 05/272014 with severe cramp-like pain in her right groin area and noted pain in the interior thigh. This eventually resolved but then she started to have " a crawling " sensation on the top of her head. This started in the right occipital region then spread to the right parietal and frontal region. When it stopped she had a headache in the right frontal aspect of the head. This lasted for two days. On the morning of Jun 26 2012 she woke up and noted she could not move her right arm. She rubbed her arm for about 5 minutes and then had full sensation and strength all but returned.  She called her primary physician which urged her to go to the ER with concern for stroke.  She did not go to the ER and till the next day on May 31 because she was concerned she did not have anyone to stay with her husband who needs total care.  Upon presentation in the ER, she complained of right shoulder pain causing weakness in her right arm. She was admitted on May 30 and discharged on May 31 with the diagnosis of TIA.  MRI brain was abnormal but did not indicate acute stroke. She was on aspirin 81 mg prior to admission and was discharged on Plavix 75 mg daily.  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 They start as a dark spot and then progress to larger dark areas. She never has a HA with these. She has never seen a neurologist. She does not take abortive medication for her HA.   UPDATE 07/25/2012:   Samantha [name] comes to the office today for followup of 06/27/2012 Hospital admission for likely TIA. She reports that she had to go to the hospital yesterday because she awoke from sleep with chest pain and palpitations. After several hours  observation in the emergency room she was released; cardiac workup was negative.  Heart rate in the emergency room was 58 and 62 on 2 EKGs.  She continues to have right-sided weakness, in both the right arm and right leg with associated numbness of the right face right arm and right leg. She has been going to outpatient physical therapy and states she has one more visit. She is ambulating with the assistance of a cane today. She continues with Plavix 75 mg and denies bleeding but has had minimal bruising. Carotid Doppler studies in the hospital were negative.2-D echo was normal.   REVIEW OF SYSTEMS: Full 14 system review of systems performed and notable only for: Constitutional: Fatigue  Cardiovascular: Chest pain, palpitations, murmur swelling in legs occasionally Ear/Nose/Throat: N/A  Skin: N/A  Eyes: N/A  Respiratory: Short of breath Gastroitestinal: N/A  Hematology/Lymphatic: Easy bruising Endocrine: N./A.  Musculoskeletal: Joint pain  Allergy/Immunology: Skin sensitivity Neurological: Memory loss at times, headaches, numbness, weakness, dizziness Sleep: Restless legs   ALLERGIES: Allergies  Allergen Reactions  . Cefuroxime Axetil     REACTION: urticaria (hives)  . Oxycodone Nausea And Vomiting  . Seldane (Terfenadine)     REACTION: urticaria (hives)  . Tramadol     REACTION: vomitting    HOME MEDICATIONS: Outpatient Prescriptions Prior to Visit  Medication Sig Dispense Refill  . albuterol (VENTOLIN HFA) 108 (  90 BASE) MCG/ACT inhaler Inhale 2 puffs into the lungs every 6 (six) hours as needed.  18 g  2  . CALCIUM PO Take 1 tablet by mouth daily.        . cetirizine (ZYRTEC) 10 MG tablet Take 1 tablet (10 mg total) by mouth daily.  90 tablet  1  . clopidogrel (PLAVIX) 75 MG tablet Take 1 tablet (75 mg total) by mouth daily with breakfast.  30 tablet  12  . famotidine (PEPCID) 20 MG tablet Take 20 mg by mouth at bedtime as needed. For acid reflux      . fluticasone (FLONASE)  50 MCG/ACT nasal spray Place 2 sprays into the nose daily.  1 g  6  . furosemide (LASIX) 20 MG tablet Take 20 mg by mouth 2 (two) times daily.      . Glucosamine-Chondroitin-MSM 500-200-150 MG TABS Take 1 tablet by mouth every Monday, Wednesday, and Friday.       [name] lisinopril (PRINIVIL,ZESTRIL) 40 MG tablet Take 1 tablet (40 mg total) by mouth daily.  30 tablet  11  . metFORMIN (GLUCOPHAGE-XR) 500 MG 24 hr tablet Take 500 mg by mouth daily with breakfast.      . Multiple Vitamin (MULTIVITAMIN) capsule Take 1 capsule by mouth daily.        . Nebivolol HCl (BYSTOLIC) 20 MG TABS Take 20 mg by mouth every morning.      . Potassium 99 MG TABS Take 99 mg by mouth daily.       . pravastatin (PRAVACHOL) 40 MG tablet Take 40 mg by mouth every evening.      . pregabalin (LYRICA) 100 MG capsule Take 100 mg by mouth at bedtime as needed (for sleep).      . ranitidine (ZANTAC) 300 MG tablet Take 1 tablet (300 mg total) by mouth at bedtime.  30 tablet  6  . BYSTOLIC 20 MG TABS TAKE 1 TABLET (20 MG TOTAL) BY MOUTH DAILY.  30 tablet  3  . pregabalin (LYRICA) 100 MG capsule Take 100 mg by mouth at bedtime.        No facility-administered medications prior to visit.    PAST MEDICAL HISTORY: Past Medical History  Diagnosis Date  . OSA (obstructive sleep apnea) 09/2007    dx w/ a sleep study, Rx CPAP, weight loss  . Hyperlipidemia   . Hypertension   . Chest pain, atypical 11/2006    saw cards: neg/normal Holter, ECHo and myoview, admitted w/ CP 4/09 neg enz.and CT chest had a 9 beat NSVT  . Fibromyalgia   . Abnormal CT of the chest 2008    follow up CT, April 2009: unchanged. No f/u suggested   . 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 Shingles 11/2009  . Reactive airway disease   . Vaginal dysplasia   . Vaginal cancer 1994  . Type II diabetes mellitus   . Heart murmur   . CHF (congestive heart failure)   . Pneumonia 2003    "double" (06/27/2012)  . Pneumonia 1990's    "once in one  lung" (06/27/2012)  . History of chronic bronchitis   . Shortness of breath     "can happen at any time" (06/27/2012)  . Asthma   . H/O hiatal hernia   . GERD (gastroesophageal reflux disease)   . Ocular migraine   . TIA (transient ischemic attack) 06/27/2012    "this is my first" (06/27/2012)  . Arthritis     "  all over" (06/27/2012)  . Rheumatoid factor positive   . Osteoarthritis     PAST SURGICAL HISTORY: Past Surgical History  Procedure Laterality Date  . Thyroidectomy, partial  1960's  . Bunionectomy Left ~ 1977  . Breast biopsy Right 1999  . Anterior cervical decomp/discectomy fusion  2001  . Cataract extraction w/ intraocular lens  implant, bilateral  2012  . Vaginal mass excision  1994    "Laser surgery for vaginal cancer; followed by chemotherapy" (06/27/2012)  . Abdominal hysterectomy  1980    FAMILY HISTORY: Family History  Problem Relation Age of Onset  . Allergies Sister   . Asthma Sister   . Asthma Paternal Grandmother   . Heart disease Father   . Heart disease Mother   . Lung cancer Mother   . Heart disease      paternal grandparents, maternal grandparents,   . Heart disease Brother   . Breast cancer Neg Hx   . Colon cancer Neg Hx     SOCIAL HISTORY: History   Social History  . Marital Status: Married    Spouse Name: Adela Glimpse    Number of Children: 2  . Years of Education: masters   Occupational History  . Retired    Social History Main Topics  . Smoking status: Former Smoker    Types: Cigarettes  . Smokeless tobacco: Never Used     Comment: Quit in 2001  . Alcohol Use: No  . Drug Use: No  . Sexually Active: No   Other Topics Concern  . Not on file   Social History Narrative   On disability since 2000--- also husband has MS     PHYSICAL EXAM  Filed Vitals:   07/25/12 0918  BP: 150/83  Pulse: 61  Height: 5' 2.5" (1.588 m)  Weight: 183 lb (83.008 kg)   Body mass index is 32.92 kg/(m^2).  Generalized: In no acute distress    Neck: Supple, no carotid bruits   Cardiac: Regular rate rhythm, murmur   Pulmonary: Clear to auscultation bilaterally   Musculoskeletal: No deformity   Neurological examination   Mentation: Alert oriented to time, place, history taking, language fluent, and causual conversation, MMSE 30/30. AST 13 (12+ is normal).  Cranial nerve II-XII: Pupils were equal round reactive to light extraocular movements were full, visual field were full on confrontational test. facial sensation and strength were normal. hearing was intact to finger rubbing bilaterally. Uvula tongue midline. head turning and shoulder shrug and were normal and symmetric.Tongue protrusion into cheek strength was normal. MOTOR: normal bulk and tone,  decreased strength RUE, RLE 4/5, Normal strength LUE, LLE 5/5,  fine finger movements  decreased on right , no pronator drift SENSORY:  light touch, pinprick, temperature, vibration  on right cheek, right upper extremity and right lower extremity COORDINATION: finger-nose-finger normal, heel-to-shin bilaterally, there was no truncal ataxia REFLEXES: Brachioradialis 2/2, biceps 2/2, triceps 2/2, patellar 1/2, Achilles 1/2, plantar responses were flexor bilaterally. GAIT/STATION: Rising up from seated position  pushes up with armrest , normal stance, without trunk ataxia, moderate stride, good arm swing, smooth turning, performs tiptoe, and heel walking with mild difficulty. Romberg positive, falls to right.  DIAGNOSTIC DATA (LABS, IMAGING, TESTING) - I reviewed patient records, labs, notes, testing and imaging myself where available.  Lab Results  Component Value Date   WBC 5.6 07/24/2012   HGB 14.0 07/24/2012   HCT 40.1 07/24/2012   MCV 82.2 07/24/2012   PLT 166 07/24/2012  Component Value Date/Time   NA 139 07/24/2012 1734   K 3.8 07/24/2012 1734   CL 100 07/24/2012 1734   CO2 28 07/24/2012 1734   GLUCOSE 164* 07/24/2012 1734   BUN 21 07/24/2012 1734   CREATININE 0.90  07/24/2012 1734   CREATININE 1.11* 08/29/2011 1640   CALCIUM 9.3 07/24/2012 1734   PROT 7.6 06/27/2012 0325   ALBUMIN 4.1 06/27/2012 0325   AST 22 06/27/2012 0325   ALT 15 06/27/2012 0325   ALKPHOS 66 06/27/2012 0325   BILITOT 0.4 06/27/2012 0325   GFRNONAA 63* 07/24/2012 1734   GFRAA 73* 07/24/2012 1734   Lab Results  Component Value Date   CHOL 154 06/28/2012   HDL 75 06/28/2012   LDLCALC 59 06/28/2012   LDLDIRECT 126.2 12/07/2010   TRIG 98 06/28/2012   CHOLHDL 2.1 06/28/2012   Lab Results  Component Value Date   HGBA1C 6.1* 06/27/2012   No results found for this basename: [id number]   Lab Results  Component Value Date   TSH 1.91 07/06/2009   CAROTID DOPPLER 06/27/12 - Findings consistent with less than 39 percent stenosis involving the right internal carotid artery and the left internal carotid artery. - Bilateral vertebral artery flow is antegrade.  MRI HEAD WITHOUT CONTRAST 06/27/12 1. Scattered periventricular subcortical T2 hyperintensities are slightly greater than expected for age. The finding is nonspecific but can be seen in the setting of chronic microvascular ischemia, a demyelinating process such as multiple sclerosis, vasculitis, complicated migraine headaches, or as the sequelae of a prior infectious or inflammatory process.  2. No acute or focal intracranial abnormality to explain the patient's symptoms.  3. Minimal sinus disease.  4. Postoperative changes of the upper cervical spine.   MRA HEAD WITHOUT CONTRAST 06/27/12 1. Mild small vessel disease.  2. No significant proximal stenosis, aneurysm, or branch vessel  occlusion.  ASSESSMENT AND PLAN  Samantha [name] is an 72 y.o. right-handed AA female with vascular risk factors of diabetes, hypertension, hyperlipidemia, and obesity with possible TIA on Jun 27, 2012.  MRI/MRA was negative for acute infarct.  She was on Aspirin 81 mg prior to admission and is now on Plavix 75mg  daily.  On examination, she had variable  effort, I did not see significant right side weakness, she has excessive stress at home.   PLAN: Continue clopidogrel 75 mg orally every day  for secondary stroke prevention and maintain strict control of hypertension with blood pressure goal below 130/90, diabetes with hemoglobin A1c goal below 6.5% and lipids with LDL cholesterol goal below 100 mg/dL.  Continue physical therapy  Followup in 4 months.  Jaeveon Ashland NP-C 07/25/2012, 9:39 AM  [facility] [address] [place], [place] [address] [phone]

## 2012-07-25 NOTE — Patient Instructions (Signed)
Return in 4-6 months

## 2012-07-26 ENCOUNTER — Encounter: Payer: Self-pay | Admitting: Family Medicine

## 2012-07-28 ENCOUNTER — Ambulatory Visit: Payer: Medicare Other | Admitting: Physical Therapy

## 2012-07-28 DIAGNOSIS — IMO0001 Reserved for inherently not codable concepts without codable children: Secondary | ICD-10-CM | POA: Diagnosis not present

## 2012-07-28 DIAGNOSIS — R279 Unspecified lack of coordination: Secondary | ICD-10-CM | POA: Diagnosis not present

## 2012-07-28 DIAGNOSIS — R269 Unspecified abnormalities of gait and mobility: Secondary | ICD-10-CM | POA: Diagnosis not present

## 2012-07-29 ENCOUNTER — Ambulatory Visit: Payer: Medicare Other

## 2012-07-29 ENCOUNTER — Ambulatory Visit: Payer: Medicare Other | Attending: Family Medicine | Admitting: Physical Therapy

## 2012-07-29 DIAGNOSIS — R269 Unspecified abnormalities of gait and mobility: Secondary | ICD-10-CM | POA: Diagnosis not present

## 2012-07-29 DIAGNOSIS — R279 Unspecified lack of coordination: Secondary | ICD-10-CM | POA: Insufficient documentation

## 2012-07-29 DIAGNOSIS — IMO0001 Reserved for inherently not codable concepts without codable children: Secondary | ICD-10-CM | POA: Insufficient documentation

## 2012-07-29 DIAGNOSIS — M25561 Pain in right knee: Secondary | ICD-10-CM | POA: Insufficient documentation

## 2012-07-29 NOTE — Assessment & Plan Note (Signed)
Knee sleeve Rest , elevation Ice Ortho if no better

## 2012-07-30 DIAGNOSIS — M19079 Primary osteoarthritis, unspecified ankle and foot: Secondary | ICD-10-CM | POA: Diagnosis not present

## 2012-07-31 NOTE — Telephone Encounter (Signed)
We will schedule the patient

## 2012-08-05 ENCOUNTER — Ambulatory Visit: Payer: Medicare Other | Admitting: Internal Medicine

## 2012-08-06 ENCOUNTER — Ambulatory Visit: Payer: Medicare Other | Admitting: Physical Therapy

## 2012-08-07 ENCOUNTER — Ambulatory Visit: Payer: Medicare Other | Admitting: Physical Therapy

## 2012-08-13 ENCOUNTER — Ambulatory Visit: Payer: Medicare Other | Admitting: Physical Therapy

## 2012-08-20 ENCOUNTER — Ambulatory Visit: Payer: Medicare Other | Admitting: Physical Therapy

## 2012-08-20 ENCOUNTER — Ambulatory Visit (INDEPENDENT_AMBULATORY_CARE_PROVIDER_SITE_OTHER): Payer: Medicare Other | Admitting: Internal Medicine

## 2012-08-20 VITALS — BP 120/70 | HR 70 | Temp 98.3°F | Wt 190.0 lb

## 2012-08-20 DIAGNOSIS — R002 Palpitations: Secondary | ICD-10-CM

## 2012-08-20 DIAGNOSIS — I10 Essential (primary) hypertension: Secondary | ICD-10-CM | POA: Diagnosis not present

## 2012-08-20 DIAGNOSIS — E119 Type 2 diabetes mellitus without complications: Secondary | ICD-10-CM | POA: Diagnosis not present

## 2012-08-20 DIAGNOSIS — G459 Transient cerebral ischemic attack, unspecified: Secondary | ICD-10-CM | POA: Diagnosis not present

## 2012-08-20 LAB — TSH: TSH: 2.26 u[IU]/mL (ref 0.35–5.50)

## 2012-08-20 NOTE — Progress Notes (Signed)
  Subjective:    Patient ID: Samantha [name], female    DOB: October 18, 1940, 72 y.o.   MRN: [id number]  HPI Followup Since the last time she was here, went to the ER 07/24/2012 chest pain or palpitations. EKG without acute changes, BMP, troponin, BNP and CBC within normal. Saw neurology 07/25/2012, feel that she possibly had a TIA, recommend  Plavix.  Past Medical History  Diagnosis Date  . OSA (obstructive sleep apnea) 09/2007    dx w/ a sleep study, Rx CPAP, weight loss  . Hyperlipidemia   . Hypertension   . Chest pain, atypical 11/2006    saw cards: neg/normal Holter, ECHo and myoview, admitted w/ CP 4/09 neg enz.and CT chest had a 9 beat NSVT  . Fibromyalgia   . Abnormal CT of the chest 2008    follow up CT, April 2009: unchanged. No f/u suggested   . 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 Shingles 11/2009  . Reactive airway disease   . Vaginal dysplasia   . Vaginal cancer 1994  . Type II diabetes mellitus   . Heart murmur   . CHF (congestive heart failure)   . Pneumonia 2003    "double" (06/27/2012)  . Pneumonia 1990's    "once in one lung" (06/27/2012)  . History of chronic bronchitis   . Shortness of breath     "can happen at any time" (06/27/2012)  . Asthma   . H/O hiatal hernia   . GERD (gastroesophageal reflux disease)   . Ocular migraine   . TIA (transient ischemic attack) 06/27/2012    "this is my first" (06/27/2012)  . Arthritis     "all over" (06/27/2012)  . Rheumatoid factor positive   . Osteoarthritis    Past Surgical History  Procedure Laterality Date  . Thyroidectomy, partial  1960's  . Bunionectomy Left ~ 1977  . Breast biopsy Right 1999  . Anterior cervical decomp/discectomy fusion  2001  . Cataract extraction w/ intraocular lens  implant, bilateral  2012  . Vaginal mass excision  1994    "Laser surgery for vaginal cancer; followed by chemotherapy" (06/27/2012)  . Abdominal hysterectomy  1980      Review of Systems Since he left  the ER no further chest pain or shortness or breath. She continued having palpitations described as "heart going fast, irregular pulse", sx last 2 hours, usually one episode a week, not related to albuterol use, related to anxiety?[name] Symptoms decrease w/ rest, she feels mildly dizzy and weak but not presyncopal. Med list reviewed, good compliance, takes albuterol very rarely. As fas as the TIA, she feels better, finished physical therapy, "my strenght is back to normal on the right side"    Objective:   Physical Exam BP 120/70  Pulse 70  Temp(Src) 98.3 F (36.8 C) (Oral)  Wt 190 lb (86.183 kg)  BMI 34.18 kg/m2  SpO2 97% General -- alert, well-developed, NAD.   Neck --no thyromegaly Lungs -- normal respiratory effort, no intercostal retractions, no accessory muscle use, and normal breath sounds.   Heart-- normal rate, regular rhythm, no murmur, and no gallop.   Extremities-- no pretibial edema bilaterally Neurologic-- alert & oriented X3 ; Face symmetric, EOMI, gait normal, speech normal, motor symmetric Psych-- Cognition and judgment appear intact. Alert and cooperative with normal attention span and concentration.  not anxious appearing and not depressed appearing.       Assessment & Plan:

## 2012-08-20 NOTE — Assessment & Plan Note (Signed)
Well-controlled per last hemoglobin A1c, no change

## 2012-08-20 NOTE — Patient Instructions (Addendum)
Next visit in 3-4 months Call sooner if you have more severe or frequent palpitations

## 2012-08-20 NOTE — Assessment & Plan Note (Signed)
Well-controlled, last BMP okay, no change 

## 2012-08-20 NOTE — Assessment & Plan Note (Addendum)
Went to the ER with chest pain and palpitations, chest pain resolved, she continue with palpitations, see review of systems. Echocardiogram 03-2012  satisfactory In light of her recent TIA I wonder about palpitations represent atrial fibrillation. Plan: TSH, Holter, arrange a routine cardiology visit

## 2012-08-20 NOTE — Assessment & Plan Note (Signed)
Saw neurology, they recommend to continue with Plavix. Patient finished physical therapy and feeling better.  Plan-- Control cardiovascular risk factors

## 2012-08-21 ENCOUNTER — Encounter: Payer: Self-pay | Admitting: Internal Medicine

## 2012-08-22 ENCOUNTER — Emergency Department (HOSPITAL_COMMUNITY): Payer: Medicare Other

## 2012-08-22 ENCOUNTER — Encounter (HOSPITAL_COMMUNITY): Payer: Self-pay | Admitting: Adult Health

## 2012-08-22 ENCOUNTER — Observation Stay (HOSPITAL_COMMUNITY): Payer: Medicare Other

## 2012-08-22 ENCOUNTER — Observation Stay (HOSPITAL_COMMUNITY)
Admission: EM | Admit: 2012-08-22 | Discharge: 2012-08-22 | Disposition: A | Discharge status: Home / Self Care | Payer: Medicare Other | Attending: Internal Medicine | Admitting: Internal Medicine

## 2012-08-22 DIAGNOSIS — I8393 Asymptomatic varicose veins of bilateral lower extremities: Secondary | ICD-10-CM

## 2012-08-22 DIAGNOSIS — E119 Type 2 diabetes mellitus without complications: Secondary | ICD-10-CM | POA: Diagnosis present

## 2012-08-22 DIAGNOSIS — M542 Cervicalgia: Secondary | ICD-10-CM

## 2012-08-22 DIAGNOSIS — E785 Hyperlipidemia, unspecified: Secondary | ICD-10-CM

## 2012-08-22 DIAGNOSIS — R42 Dizziness and giddiness: Secondary | ICD-10-CM | POA: Diagnosis not present

## 2012-08-22 DIAGNOSIS — R002 Palpitations: Secondary | ICD-10-CM

## 2012-08-22 DIAGNOSIS — IMO0001 Reserved for inherently not codable concepts without codable children: Secondary | ICD-10-CM

## 2012-08-22 DIAGNOSIS — N390 Urinary tract infection, site not specified: Secondary | ICD-10-CM

## 2012-08-22 DIAGNOSIS — Z79899 Other long term (current) drug therapy: Secondary | ICD-10-CM | POA: Insufficient documentation

## 2012-08-22 DIAGNOSIS — R0609 Other forms of dyspnea: Secondary | ICD-10-CM

## 2012-08-22 DIAGNOSIS — R609 Edema, unspecified: Secondary | ICD-10-CM

## 2012-08-22 DIAGNOSIS — E162 Hypoglycemia, unspecified: Secondary | ICD-10-CM | POA: Diagnosis not present

## 2012-08-22 DIAGNOSIS — M25561 Pain in right knee: Secondary | ICD-10-CM

## 2012-08-22 DIAGNOSIS — E1169 Type 2 diabetes mellitus with other specified complication: Secondary | ICD-10-CM | POA: Diagnosis present

## 2012-08-22 DIAGNOSIS — Z8673 Personal history of transient ischemic attack (TIA), and cerebral infarction without residual deficits: Secondary | ICD-10-CM | POA: Diagnosis not present

## 2012-08-22 DIAGNOSIS — I10 Essential (primary) hypertension: Secondary | ICD-10-CM | POA: Diagnosis not present

## 2012-08-22 DIAGNOSIS — Z0000 Encounter for general adult medical examination without abnormal findings: Secondary | ICD-10-CM

## 2012-08-22 DIAGNOSIS — H538 Other visual disturbances: Secondary | ICD-10-CM | POA: Diagnosis not present

## 2012-08-22 DIAGNOSIS — G450 Vertebro-basilar artery syndrome: Secondary | ICD-10-CM | POA: Diagnosis present

## 2012-08-22 DIAGNOSIS — R5381 Other malaise: Secondary | ICD-10-CM

## 2012-08-22 DIAGNOSIS — G459 Transient cerebral ischemic attack, unspecified: Principal | ICD-10-CM | POA: Diagnosis present

## 2012-08-22 DIAGNOSIS — R0989 Other specified symptoms and signs involving the circulatory and respiratory systems: Secondary | ICD-10-CM

## 2012-08-22 DIAGNOSIS — R5383 Other fatigue: Secondary | ICD-10-CM

## 2012-08-22 DIAGNOSIS — M1990 Unspecified osteoarthritis, unspecified site: Secondary | ICD-10-CM

## 2012-08-22 LAB — CREATININE, SERUM
Creatinine, Ser: 0.79 mg/dL (ref 0.50–1.10)
GFR calc Af Amer: >90 mL/min (ref >90)

## 2012-08-22 LAB — CBC
MCH: 28.4 pg (ref 26.0–34.0)
MCHC: 34.9 g/dL (ref 30.0–36.0)
Platelets: 128 10*3/uL — ABNORMAL LOW (ref 150–400)
Platelets: 140 10*3/uL — ABNORMAL LOW (ref 150–400)
RBC: 4.4 MIL/uL (ref 3.87–5.11)
RDW: 14.8 % (ref 11.5–15.5)
WBC: 3.2 10*3/uL — ABNORMAL LOW (ref 4.0–10.5)

## 2012-08-22 LAB — GLUCOSE, CAPILLARY
Glucose-Capillary: 94 mg/dL (ref 70–99)
Glucose-Capillary: 98 mg/dL (ref 70–99)

## 2012-08-22 LAB — POCT I-STAT, CHEM 8
BUN: 19 mg/dL (ref 6–23)
Calcium, Ion: 1.16 mmol/L (ref 1.13–1.30)
Hemoglobin: 12.9 g/dL (ref 12.0–15.0)
Sodium: 139 mEq/L (ref 135–145)
TCO2: 27 mmol/L (ref 0–100)

## 2012-08-22 LAB — URINALYSIS, ROUTINE W REFLEX MICROSCOPIC
Glucose, UA: NEGATIVE mg/dL
Ketones, ur: NEGATIVE mg/dL
Leukocytes, UA: NEGATIVE
Protein, ur: NEGATIVE mg/dL
Urobilinogen, UA: 0.2 mg/dL (ref 0.0–1.0)

## 2012-08-22 LAB — RAPID URINE DRUG SCREEN, HOSP PERFORMED
Amphetamines: NOT DETECTED
Barbiturates: NOT DETECTED
Tetrahydrocannabinol: NOT DETECTED

## 2012-08-22 LAB — LIPID PANEL: Cholesterol: 148 mg/dL (ref 0–200)

## 2012-08-22 MED ORDER — SODIUM CHLORIDE 0.9 % IV SOLN
INTRAVENOUS | Status: DC
  Administered 2012-08-22: 02:00:00 via INTRAVENOUS

## 2012-08-22 MED ORDER — FUROSEMIDE 20 MG PO TABS
20.0000 mg | ORAL_TABLET | Freq: Two times a day (BID) | ORAL | Status: DC
  Filled 2012-08-22 (×3): qty 1

## 2012-08-22 MED ORDER — FAMOTIDINE IN NACL 20-0.9 MG/50ML-% IV SOLN
20.0000 mg | Freq: Two times a day (BID) | INTRAVENOUS | Status: DC
  Administered 2012-08-22: 20 mg via INTRAVENOUS
  Filled 2012-08-22 (×2): qty 50

## 2012-08-22 MED ORDER — LISINOPRIL 40 MG PO TABS
40.0000 mg | ORAL_TABLET | Freq: Every day | ORAL | Status: DC
  Administered 2012-08-22: 40 mg via ORAL
  Filled 2012-08-22: qty 1

## 2012-08-22 MED ORDER — INSULIN ASPART 100 UNIT/ML ~~LOC~~ SOLN: 0.0000 [IU] | Freq: Three times a day (TID) | SUBCUTANEOUS | Status: DC

## 2012-08-22 MED ORDER — SIMVASTATIN 40 MG PO TABS
40.0000 mg | ORAL_TABLET | Freq: Every day | ORAL | Status: DC
  Filled 2012-08-22: qty 1

## 2012-08-22 MED ORDER — INSULIN ASPART 100 UNIT/ML ~~LOC~~ SOLN: 0.0000 [IU] | Freq: Every day | SUBCUTANEOUS | Status: DC

## 2012-08-22 MED ORDER — CLOPIDOGREL BISULFATE 75 MG PO TABS: 75.0000 mg | ORAL_TABLET | Freq: Every day | ORAL | Status: DC

## 2012-08-22 MED ORDER — PREGABALIN 50 MG PO CAPS: 100.0000 mg | ORAL_CAPSULE | Freq: Every evening | ORAL | Status: DC | PRN

## 2012-08-22 MED ORDER — METFORMIN HCL ER 500 MG PO TB24
500.0000 mg | ORAL_TABLET | Freq: Every day | ORAL | Status: DC
  Filled 2012-08-22 (×2): qty 1

## 2012-08-22 MED ORDER — NEBIVOLOL HCL 10 MG PO TABS
20.0000 mg | ORAL_TABLET | Freq: Every morning | ORAL | Status: DC
  Administered 2012-08-22: 20 mg via ORAL
  Filled 2012-08-22: qty 2

## 2012-08-22 MED ORDER — LORATADINE 10 MG PO TABS
10.0000 mg | ORAL_TABLET | Freq: Every day | ORAL | Status: DC
  Administered 2012-08-22: 10 mg via ORAL
  Filled 2012-08-22: qty 1

## 2012-08-22 MED ORDER — HEPARIN SODIUM (PORCINE) 5000 UNIT/ML IJ SOLN
5000.0000 [IU] | Freq: Three times a day (TID) | INTRAMUSCULAR | Status: DC
  Administered 2012-08-22: 5000 [IU] via SUBCUTANEOUS
  Filled 2012-08-22 (×4): qty 1

## 2012-08-22 MED ORDER — POLYVINYL ALCOHOL 1.4 % OP SOLN
1.0000 [drp] | Freq: Every day | OPHTHALMIC | Status: DC | PRN
  Filled 2012-08-22: qty 15

## 2012-08-22 MED ORDER — ALBUTEROL SULFATE HFA 108 (90 BASE) MCG/ACT IN AERS
2.0000 | INHALATION_SPRAY | RESPIRATORY_TRACT | Status: DC | PRN
  Filled 2012-08-22: qty 6.7

## 2012-08-22 MED ORDER — POTASSIUM CHLORIDE CRYS ER 10 MEQ PO TBCR
10.0000 meq | EXTENDED_RELEASE_TABLET | Freq: Every day | ORAL | Status: DC
  Administered 2012-08-22: 10 meq via ORAL
  Filled 2012-08-22: qty 1

## 2012-08-22 MED ORDER — POLYETHYL GLYCOL-PROPYL GLYCOL 0.4-0.3 % OP GEL: 1.0000 [drp] | Freq: Every day | OPHTHALMIC | Status: DC | PRN

## 2012-08-22 MED ORDER — ACETAMINOPHEN 325 MG PO TABS
650.0000 mg | ORAL_TABLET | Freq: Once | ORAL | Status: DC
  Filled 2012-08-22: qty 2

## 2012-08-22 MED ORDER — FAMOTIDINE 20 MG PO TABS
20.0000 mg | ORAL_TABLET | Freq: Two times a day (BID) | ORAL | Status: DC
  Filled 2012-08-22: qty 1

## 2012-08-22 NOTE — ED Notes (Signed)
Presents with reports of hypoglycemia despite eating. Lowest CBg today was 96 with food. She staes her CBGs run over 100 usually and for the past day she has been feeling nauseated, weak and "swimmy headed" she denies pain but reports that she feels like she had fever and checked but her temp was 98.9.  CBG at this time 97. She reports that she feels full and shaky and at this time.

## 2012-08-22 NOTE — ED Notes (Signed)
Pt to CT

## 2012-08-22 NOTE — Evaluation (Signed)
Clinical/Bedside Swallow Evaluation Patient Details  Name: Samantha [name] MRN: [id number] Date of Birth: 02-15-1940  Today's Date: 08/22/2012 Time: 8295-6213 SLP Time Calculation (min): 27 min  Past Medical History:  Past Medical History  Diagnosis Date  . OSA (obstructive sleep apnea) 09/2007    dx w/ a sleep study, Rx CPAP, weight loss  . Hyperlipidemia   . Hypertension   . Chest pain, atypical 11/2006    saw cards: neg/normal Holter, ECHo and myoview, admitted w/ CP 4/09 neg enz.and CT chest had a 9 beat NSVT  . Fibromyalgia   . Abnormal CT of the chest 2008    follow up CT, April 2009: unchanged. No f/u suggested   . 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 Shingles 11/2009  . Reactive airway disease   . Vaginal dysplasia   . Vaginal cancer 1994  . Type II diabetes mellitus   . Heart murmur   . CHF (congestive heart failure)   . Pneumonia 2003    "double" (06/27/2012)  . Pneumonia 1990's    "once in one lung" (06/27/2012)  . History of chronic bronchitis   . Shortness of breath     "can happen at any time" (06/27/2012)  . Asthma   . H/O hiatal hernia   . GERD (gastroesophageal reflux disease)   . Ocular migraine   . TIA (transient ischemic attack) 06/27/2012    "this is my first" (06/27/2012)  . Arthritis     "all over" (06/27/2012)  . Rheumatoid factor positive   . Osteoarthritis    Past Surgical History:  Past Surgical History  Procedure Laterality Date  . Thyroidectomy, partial  1960's  . Bunionectomy Left ~ 1977  . Breast biopsy Right 1999  . Anterior cervical decomp/discectomy fusion  2001  . Cataract extraction w/ intraocular lens  implant, bilateral  2012  . Vaginal mass excision  1994    "Laser surgery for vaginal cancer; followed by chemotherapy" (06/27/2012)  . Abdominal hysterectomy  1980   HPI:  Samantha [name] is an 72 y.o. female with hx of TIA with complete work up 2 months ago, OSA, hyperlipidemia, HTN, fibromyalgia, thyroid  tumor s/p thyroidectomy, ocular migraine, presents to the ER with episodes of vertigo. She said the vertigo came acutely and resolved spontaneously. There were no vertigo with head movement. She has had no slurred speech, facial drop nor any focal weaknesses.  Evaluation in the ER included a head CT which was negative, Serology was unremarkable. Hospitalist was asked to admit her for futher work up. Past procedure includes anterior cervical fusion in 2001; pt also has history of GERD. She stated that since her cervical fusion, she feels food getting "hung up" and has to drink a few sips of water to get it to go down. No history of aspiration; pt reports no difficulty with liquids.   Assessment / Plan / Recommendation Clinical Impression  Pt described globus sensation with solids with food and medications getting "hung up" since her anterior fusion surgery; however she is able to clear material with multiple swallows and alternating food/ liquid. Pt did not demonstrate overt s/s of aspiration with any material at bedside but did require multiple swallows with all material. Rx dysphagia 3 with thin liquids and meds crushed in puree due to difficulties with them getting stuck. Also rx for pt to sit upright 30-60 mins after meal due to GERD.    Aspiration Risk  Mild  Diet Recommendation Dysphagia 3 (Mechanical Soft);Thin liquid   Liquid Administration via: Straw;Cup Medication Administration: Crushed with puree Supervision: Patient able to self feed Compensations: Slow rate;Small sips/bites;Follow solids with liquid Postural Changes and/or Swallow Maneuvers: Seated upright 90 degrees;Upright 30-60 min after meal    Other  Recommendations     Follow Up Recommendations  None    Frequency and Duration        Pertinent Vitals/Pain na    SLP Swallow Goals     Swallow Study Prior Functional Status       General HPI: Samantha [name] is an 72 y.o. female with hx of TIA with complete work  up 2 months ago, OSA, hyperlipidemia, HTN, fibromyalgia, thyroid tumor s/p thyroidectomy, ocular migraine, presents to the ER with episodes of vertigo. She said the vertigo came acutely and resolved spontaneously. There were no vertigo with head movement. She has had no slurred speech, facial drop nor any focal weaknesses.  Evaluation in the ER included a head CT which was negative, Serology was unremarkable. Hospitalist was asked to admit her for futher work up. Past procedure includes anterior cervical fusion in 2001; pt also has history of GERD. She stated that since her cervical fusion, she feels food getting "hung up" and has to drink a few sips of water to get it to go down. No history of aspiration; pt reports no difficulty with liquids. Type of Study: Bedside swallow evaluation Diet Prior to this Study: Regular;Thin liquids Temperature Spikes Noted: No Respiratory Status: Room air History of Recent Intubation: No Behavior/Cognition: Alert;Pleasant mood;Cooperative Oral Cavity - Dentition: Adequate natural dentition Self-Feeding Abilities: Able to feed self Patient Positioning: Upright in bed Baseline Vocal Quality: Clear    Oral/Motor/Sensory Function Overall Oral Motor/Sensory Function: Appears within functional limits for tasks assessed Labial ROM: Within Functional Limits Labial Symmetry: Within Functional Limits Labial Strength: Within Functional Limits Lingual ROM: Within Functional Limits Lingual Symmetry: Within Functional Limits Lingual Strength: Within Functional Limits Facial Symmetry: Within Functional Limits   Ice Chips Ice chips: Not tested   Thin Liquid Thin Liquid: Within functional limits Presentation: Straw Other Comments: Pt swallowed 2-3 times with each sip but did not demonstrate s/s of aspirating any material; stated it feels like "it's sitting there" in her throat when swallowing.    Nectar Thick Nectar Thick Liquid: Not tested   Honey Thick Honey Thick Liquid:  Not tested   Puree Puree: Within functional limits Presentation: Self Fed;Spoon   Solid   GO Functional Assessment Tool Used: clinical judgment Functional Limitations: Swallowing Swallow Current Status (W0981): At least 1 percent but less than 20 percent impaired, limited or restricted Swallow Goal Status [phone]): At least 1 percent but less than 20 percent impaired, limited or restricted Swallow Discharge Status [phone]): At least 1 percent but less than 20 percent impaired, limited or restricted  Solid: Within functional limits Presentation: Self Fed       Oleksiak, Amy K, MA, CFY-SLP 08/22/2012,10:48 AM

## 2012-08-22 NOTE — Evaluation (Signed)
Physical Therapy Evaluation Patient Details Name: Samantha [name] MRN: [id number] DOB: Jun 27, 1940 Today's Date: 08/22/2012 Time: 9562-1308 PT Time Calculation (min): 31 min  PT Assessment / Plan / Recommendation History of Present Illness  72 y.o. hx of TIA with complete work up 2 months ago, OSA, hyperlipidemia, HTN, fibromyalgia, thyroid tumor s/p thyroidectomy, ocular migraine, presents to the ER with episodes of vertigo. She said the vertigo came acutely and resolved spontaneously. There were no vertigo with head movement. She has had no slurred speech, facial drop nor any focal weaknesses. Describes blurry vision during event. CT neg MRI neg.   Clinical Impression  PT admitted with dizziness and work up for TIA negative. Pt currently with functional limitiations due to the deficits listed below (see PT problem list). Pt with balance deficits. Pt verbalize concerns for husbands well being. Pt will benefit from skilled PT to increase their independence and safety with functional mobility and balance.    PT Assessment  All further PT needs can be met in the next venue of care    Follow Up Recommendations  Home health PT;Supervision - Intermittent    Does the patient have the potential to tolerate intense rehabilitation      Barriers to Discharge        Equipment Recommendations  None recommended by PT    Recommendations for Other Services     Frequency      Precautions / Restrictions Precautions Precautions: Fall   Pertinent Vitals/Pain       Mobility  Bed Mobility Bed Mobility: Not assessed Transfers Transfers: Stand to Sit;Sit to Stand Sit to Stand: 5: Supervision;With upper extremity assist;From bed Stand to Sit: 5: Supervision;With upper extremity assist;To bed Details for Transfer Assistance: initially feels light headed and needs to hold ontl a stationary surface. Ambulation/Gait Ambulation/Gait Assistance: 4: Min guard;5: Supervision Ambulation Distance  (Feet): 400 Feet Assistive device: Rolling walker;Straight cane Ambulation/Gait Assistance Details: Initially showed significant instability, reaching out for furniture.  Showed improved stability with RW, but mild ataxia, then progress to guarded with cane to improved stability with cane.  Safest with RW Gait Pattern: Step-through pattern Gait velocity: slower Stairs: No    Exercises     PT Diagnosis: Difficulty walking;Generalized weakness  PT Problem List: Decreased strength;Decreased activity tolerance;Decreased balance;Decreased coordination;Decreased knowledge of use of DME PT Treatment Interventions:       PT Goals(Current goals can be found in the care plan section) Acute Rehab PT Goals Patient Stated Goal: to get back to walking without dizziness PT Goal Formulation: With patient  Visit Information  Last PT Received On: 08/22/12 Assistance Needed: +1 History of Present Illness: 72 y.o. hx of TIA with complete work up 2 months ago, OSA, hyperlipidemia, HTN, fibromyalgia, thyroid tumor s/p thyroidectomy, ocular migraine, presents to the ER with episodes of vertigo. She said the vertigo came acutely and resolved spontaneously. There were no vertigo with head movement. She has had no slurred speech, facial drop nor any focal weaknesses. Describes blurry vision during event. CT neg MRI neg.        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 Dgter lives 2 blocks away. Pt feels it is time to transition spouse to SNF. spouse and dgter do not feel it is time to transition to SNF. Pt verbalized inability to provide care needed Communication Communication: No difficulties Dominant Hand: Right    Cognition  Cognition Arousal/Alertness: Awake/alert Behavior During Therapy: WFL for tasks assessed/performed Overall Cognitive Status: Within Functional Limits for tasks assessed    Extremity/Trunk Assessment Upper Extremity Assessment Upper Extremity Assessment: RUE deficits/detail RUE Sensation: decreased light touch (describes numbness more than normal even after TIA) RUE Coordination: decreased fine motor (difficulty opening containers) Lower Extremity Assessment Lower Extremity Assessment: Generalized weakness;RLE deficits/detail RLE Deficits / Details: decr lt touch RLE Sensation: decreased light touch Cervical / Trunk Assessment Cervical / Trunk Assessment: Normal   Balance Balance Balance Assessed: No Standardized Balance Assessment Standardized Balance Assessment: Berg Balance Test Berg Balance Test Sit to Stand: Able to stand  independently using hands Standing Unsupported: Able to stand 30 seconds unsupported Sitting with Back Unsupported but Feet Supported on Floor or Stool: Able to sit 2 minutes under supervision Stand to Sit: Controls descent by using hands Standing Unsupported with Eyes Closed: Unable to keep eyes closed 3 seconds but stays steady Standing Ubsupported with Feet Together: Needs help to attain position but able to stand for 30 seconds with feet together Turn 360 Degrees: Able to turn 360 degrees safely but slowly High Level Balance High Level Balance Activites: Direction changes;Backward walking;Turns;Sudden stops;Head turns High Level Balance Comments: pt demonstrates incr fall risk and need to  use BIL UE for support. pt reaching for environmental supports or extending BIL UE in a guarded manner. Recommend RW for d/c home  End of Session PT - End of Session Activity Tolerance: Patient tolerated treatment well Patient left: in bed;with call bell/phone within reach;with family/visitor present Nurse Communication: Mobility status  GP Functional Assessment Tool Used: clinical judgement Functional Limitation: Mobility: Walking and moving around Mobility: Walking and Moving Around Current Status ([id number]): At least 1 percent but less than 20 percent impaired, limited or restricted Mobility: Walking and Moving Around Goal Status [phone]): At least 1 percent but less than 20 percent impaired, limited or restricted Mobility: Walking and Moving Around Discharge Status [phone]): At least 1 percent but less than 20 percent impaired, limited or restricted   Jeidi Gilles, Eliseo Gum 08/22/2012, 2:49 PM

## 2012-08-22 NOTE — Evaluation (Signed)
Occupational Therapy Evaluation Patient Details Name: Samantha [name] MRN: [id number] DOB: [birth year] Today's Date: 08/22/2012 Time: 0865-7846 OT Time Calculation (min): 52 min  OT Assessment / Plan / Recommendation History of present illness 72 y.o. hx of TIA with complete work up 2 months ago, OSA, hyperlipidemia, HTN, fibromyalgia, thyroid tumor s/p thyroidectomy, ocular migraine, presents to the ER with episodes of vertigo. She said the vertigo came acutely and resolved spontaneously. There were no vertigo with head movement. She has had no slurred speech, facial drop nor any focal weaknesses. Describes blurry vision during event. CT neg MRI neg.    Clinical Impression   PT admitted with dizziness and work up for TIA negative. Pt currently with functional limitiations due to the deficits listed below (see OT problem list). Pt with balance deficits. Pt verbalize concerns for husbands well being. Pt will benefit from skilled OT to increase their independence and safety with adls and balance to allow discharge HHOT.     OT Assessment  Patient needs continued OT Services    Follow Up Recommendations  Home health OT    Barriers to Discharge      Equipment Recommendations  None recommended by OT    Recommendations for Other Services    Frequency  Min 2X/week    Precautions / Restrictions Precautions Precautions: Fall   Pertinent Vitals/Pain HA    ADL  Eating/Feeding: Modified independent Where Assessed - Eating/Feeding: Chair Grooming: Wash/dry hands;Wash/dry face;Teeth care;Min guard Where Assessed - Grooming: Unsupported standing Toilet Transfer: Minimal assistance Toilet Transfer Method: Sit to stand Toilet Transfer Equipment: Regular height toilet;Grab bars Toileting - Clothing Manipulation and Hygiene: Minimal assistance Where Assessed - Engineer, mining and Hygiene: Sit to stand from 3-in-1 or toilet Equipment Used: Gait belt;Cane;Rolling  walker Transfers/Ambulation Related to ADLs: Pt with incr ability to mobilize with RW and decr fall risk. pt with dizziness throughout session but it varied in intensity. Pt described HA moving from Lt to Rt then to the center of head. pt ask" why do my head pains move?" Pt demonstrates incr fall risk in small spaces. Pt holding staff and walls in bathroom with in ability to static stand without leaning. Ambulating in a well lite hallway with incr space ambulations supervision with RW ADL Comments: pt is to dc home with limited care giver support. Pt very concerned with spouse well being upon her d/c. Pt admits to inabiliy to care for spouse at home. Pt states a nurse and dgter are her only support for spouse. Pt requesting assistance with spouse and need for snf. Pt demonstrates fall risk without RW. Pt educated on using 3n1 at night and leaving night lights on.     OT Diagnosis: Generalized weakness;Acute pain  OT Problem List: Decreased strength;Decreased activity tolerance;Impaired balance (sitting and/or standing);Pain OT Treatment Interventions: Therapeutic activities;Self-care/ADL training;Therapeutic exercise;DME and/or AE instruction;Balance training;Patient/family education   OT Goals(Current goals can be found in the care plan section) Acute Rehab OT Goals Patient Stated Goal: to get back to walking without dizziness OT Goal Formulation: With patient Time For Goal Achievement: 09/05/12 Potential to Achieve Goals: Good  Visit Information  Last OT Received On: 05/23/12 Assistance Needed: +1 PT/OT Co-Evaluation/Treatment: Yes History of Present Illness: 72 y.o. hx of TIA with complete work up 2 months ago, OSA, hyperlipidemia, HTN, fibromyalgia, thyroid tumor s/p thyroidectomy, ocular migraine, presents to the ER with episodes of vertigo. She said the vertigo came acutely and resolved spontaneously. There were no vertigo with head movement. She  has had no slurred speech, facial drop nor  any focal weaknesses. Describes blurry vision during event. CT neg MRI neg.        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 Dgter lives 2 blocks away. Pt feels it is time to transition spouse to SNF. spouse and dgter do not feel it is time to transition to SNF. Pt verbalized inability to provide care needed Communication Communication: No difficulties Dominant Hand: Right         Vision/Perception Vision - History Visual History: Corrective eye surgery (for cataracts) Patient Visual Report: No change from baseline Vision - Assessment Eye Alignment: Within Functional Limits Vision Assessment: Vision tested Ocular Range of Motion: Within Functional Limits Saccades: Within functional limits Convergence: Within functional limits   Cognition  Cognition Arousal/Alertness: Awake/alert Behavior During Therapy: WFL for tasks assessed/performed Overall Cognitive Status: Within Functional Limits for tasks assessed    Extremity/Trunk Assessment Upper Extremity Assessment Upper Extremity Assessment: RUE deficits/detail RUE Sensation: decreased light touch (describes numbness more than normal even after TIA) RUE Coordination: decreased fine motor (difficulty opening containers) Lower Extremity Assessment Lower Extremity Assessment:  Overall WFL for tasks assessed Cervical / Trunk Assessment Cervical / Trunk Assessment: Normal     Mobility Bed Mobility Bed Mobility: Not assessed Transfers Transfers: Sit to Stand;Stand to Sit Sit to Stand: 5: Supervision;With upper extremity assist;From bed Stand to Sit: 5: Supervision;With upper extremity assist;To bed     Exercise     Balance Standardized Balance Assessment Standardized Balance Assessment: Berg Balance Test Berg Balance Test Sit to Stand: Able to stand  independently using hands Standing Unsupported: Able to stand 30 seconds unsupported Standing Unsupported with Eyes Closed: Unable to keep eyes closed 3 seconds but stays steady Standing Ubsupported with Feet Together: Needs help to attain position but able to stand for 30 seconds with feet together Turn 360 Degrees: Able to turn 360 degrees safely but slowly High Level Balance High Level Balance Activites: Direction changes;Backward walking;Turns;Sudden stops;Head turns High Level Balance Comments: pt demonstrates incr fall risk and need to use BIL UE for support. pt reaching for environmental supports or extending BIL UE in a guarded manner. Recommend RW for d/c home   End of Session OT - End of Session Activity Tolerance: Patient tolerated treatment well Patient left: in bed;with call bell/phone within reach;with family/visitor present Nurse Communication: Mobility status;Precautions  GO Functional Assessment Tool Used: clinical judgement Functional Limitation: Self care Self Care Current Status (Z6109): At least 1 percent but less than 20 percent impaired, limited or restricted Self Care Goal Status (U0454): At least 1 percent but less than 20 percent impaired, limited or restricted   Lucile Shutters 08/22/2012, 11:46 AM Pager: [phone]

## 2012-08-22 NOTE — ED Provider Notes (Signed)
CSN: [id number]     Arrival date & time 08/22/12  0017 History     First MD Initiated Contact with Patient 08/22/12 0026     Chief Complaint  Patient presents with  . Hypoglycemia   (Consider location/radiation/quality/duration/timing/severity/associated sxs/prior Treatment) HPI Hx per PT - feeling nauseated today and "jumpy" similar to when she had a TIA 3 months ago.  No HA or unilateral weakness/ numbness. No diff with speech.  Some trouble walking all day today due to feeling dizzy. She felt like her vision was jumping for 5-10 minutes / uncertain if unilateral or both eyes affected. This has resolved. No CP or SOB. PT very worried about blood sugar, states is usually higher in the range of 120-130, today 90s. No syncope/ near syncope. Overall symptoms started this am after waking up.  Worse around 11pm and not improved after eating dinner tonight. She still has dizzieness and nausea.   Past Medical History  Diagnosis Date  . OSA (obstructive sleep apnea) 09/2007    dx w/ a sleep study, Rx CPAP, weight loss  . Hyperlipidemia   . Hypertension   . Chest pain, atypical 11/2006    saw cards: neg/normal Holter, ECHo and myoview, admitted w/ CP 4/09 neg enz.and CT chest had a 9 beat NSVT  . Fibromyalgia   . Abnormal CT of the chest 2008    follow up CT, April 2009: unchanged. No f/u suggested   . 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 Shingles 11/2009  . Reactive airway disease   . Vaginal dysplasia   . Vaginal cancer 1994  . Type II diabetes mellitus   . Heart murmur   . CHF (congestive heart failure)   . Pneumonia 2003    "double" (06/27/2012)  . Pneumonia 1990's    "once in one lung" (06/27/2012)  . History of chronic bronchitis   . Shortness of breath     "can happen at any time" (06/27/2012)  . Asthma   . H/O hiatal hernia   . GERD (gastroesophageal reflux disease)   . Ocular migraine   . TIA (transient ischemic attack) 06/27/2012    "this is my first"  (06/27/2012)  . Arthritis     "all over" (06/27/2012)  . Rheumatoid factor positive   . Osteoarthritis    Past Surgical History  Procedure Laterality Date  . Thyroidectomy, partial  1960's  . Bunionectomy Left ~ 1977  . Breast biopsy Right 1999  . Anterior cervical decomp/discectomy fusion  2001  . Cataract extraction w/ intraocular lens  implant, bilateral  2012  . Vaginal mass excision  1994    "Laser surgery for vaginal cancer; followed by chemotherapy" (06/27/2012)  . Abdominal hysterectomy  1980   Family History  Problem Relation Age of Onset  . Allergies Sister   . Asthma Sister   . Asthma Paternal Grandmother   . Heart disease Father   . Heart disease Mother   . Lung cancer Mother   . Heart disease      paternal grandparents, maternal grandparents,   . Heart disease Brother   . Breast cancer Neg Hx   . Colon cancer Neg Hx    History  Substance Use Topics  . Smoking status: Former Smoker    Types: Cigarettes  . Smokeless tobacco: Never Used     Comment: Quit in 2001  . Alcohol Use: No   OB History   Grav Para Term Preterm Abortions TAB SAB  Ect Mult Living                 Review of Systems  Constitutional: Negative for fever and chills.  HENT: Negative for neck pain and neck stiffness.   Eyes: Negative for pain.  Respiratory: Negative for shortness of breath.   Cardiovascular: Negative for chest pain.  Gastrointestinal: Positive for nausea. Negative for abdominal pain.  Genitourinary: Negative for dysuria.  Musculoskeletal: Negative for back pain.  Skin: Negative for rash.  Neurological: Positive for dizziness. Negative for headaches.  All other systems reviewed and are negative.    Allergies  Cefuroxime axetil; Oxycodone; Seldane; and Tramadol  Home Medications   Current Outpatient Rx  Name  Route  Sig  Dispense  Refill  . albuterol (VENTOLIN HFA) 108 (90 BASE) MCG/ACT inhaler   Inhalation   Inhale 2 puffs into the lungs every 6 (six) hours as  needed.   18 g   2   . CALCIUM PO   Oral   Take 1 tablet by mouth daily.           . cetirizine (ZYRTEC) 10 MG tablet   Oral   Take 1 tablet (10 mg total) by mouth daily.   90 tablet   1   . clopidogrel (PLAVIX) 75 MG tablet   Oral   Take 1 tablet (75 mg total) by mouth daily with breakfast.   30 tablet   12   . fluticasone (FLONASE) 50 MCG/ACT nasal spray   Nasal   Place 2 sprays into the nose daily.   1 g   6   . furosemide (LASIX) 20 MG tablet   Oral   Take 20 mg by mouth 2 (two) times daily.         [name] lisinopril (PRINIVIL,ZESTRIL) 40 MG tablet   Oral   Take 1 tablet (40 mg total) by mouth daily.   30 tablet   11   . metFORMIN (GLUCOPHAGE-XR) 500 MG 24 hr tablet   Oral   Take 500 mg by mouth daily with breakfast.         . Multiple Vitamin (MULTIVITAMIN) capsule   Oral   Take 1 capsule by mouth daily.           . Nebivolol HCl (BYSTOLIC) 20 MG TABS   Oral   Take 20 mg by mouth every morning.         . NON FORMULARY      1 tablet daily. Osteo-biflex         . Potassium 99 MG TABS   Oral   Take 99 mg by mouth daily.          . pravastatin (PRAVACHOL) 40 MG tablet   Oral   Take 40 mg by mouth every evening.         . pregabalin (LYRICA) 100 MG capsule   Oral   Take 100 mg by mouth at bedtime as needed (for sleep).         . ranitidine (ZANTAC) 300 MG tablet   Oral   Take 1 tablet (300 mg total) by mouth at bedtime.   30 tablet   6    BP 169/95  Pulse 66  Temp(Src) 97.8 F (36.6 C) (Oral)  Resp 16  SpO2 100% Physical Exam  Constitutional: She is oriented to person, place, and time. She appears well-developed and well-nourished.  HENT:  Head: Normocephalic and atraumatic.  Eyes: EOM are normal. Pupils are equal, round, and  reactive to light.  Neck: Neck supple.  Cardiovascular: Normal rate, regular rhythm and intact distal pulses.   Pulmonary/Chest: Effort normal and breath sounds normal. No respiratory distress. She  exhibits no tenderness.  Abdominal: Soft. There is no tenderness.  Musculoskeletal: Normal range of motion. She exhibits no edema.  Neurological: She is alert and oriented to person, place, and time. She displays normal reflexes. No cranial nerve deficit. She exhibits normal muscle tone. Coordination normal.  No nystagmus No pronator drift Speech clear No facial droop  Skin: Skin is warm and dry.    ED Course   Procedures (including critical care time)  Results for orders placed during the hospital encounter of 08/22/12  GLUCOSE, CAPILLARY      Result Value Range   Glucose-Capillary 97  70 - 99 mg/dL   Comment 1 Notify RN     Comment 2 Documented in Chart    PROTIME-INR      Result Value Range   Prothrombin Time 12.1  11.6 - 15.2 seconds   INR 0.91  0.00 - 1.49  CBC      Result Value Range   WBC 4.3  4.0 - 10.5 K/uL   RBC 4.58  3.87 - 5.11 MIL/uL   Hemoglobin 13.0  12.0 - 15.0 g/dL   HCT 16.1  09.6 - 04.5 %   MCV 81.2  78.0 - 100.0 fL   MCH 28.4  26.0 - 34.0 pg   MCHC 34.9  30.0 - 36.0 g/dL   RDW 40.9  81.1 - 91.4 %   Platelets 140 (*) 150 - 400 K/uL  POCT I-STAT, CHEM 8      Result Value Range   Sodium 139  135 - 145 mEq/L   Potassium 3.8  3.5 - 5.1 mEq/L   Chloride 104  96 - 112 mEq/L   BUN 19  6 - 23 mg/dL   Creatinine, Ser 7.82  0.50 - 1.10 mg/dL   Glucose, Bld 94  70 - 99 mg/dL   Calcium, Ion 9.56  2.13 - 1.30 mmol/L   TCO2 27  0 - 100 mmol/L   Hemoglobin 12.9  12.0 - 15.0 g/dL   HCT 08.6  57.8 - 46.9 %   Dg Chest 2 View  07/24/2012   *RADIOLOGY REPORT*  Clinical Data: Chest pain for 2 days.  CHF.  CHEST - 2 VIEW  Comparison: 05/04/2011.  Findings: Mild cardiomegaly.    No infiltrates or failure.  No effusion or pneumothorax.  Tortuous aorta. Degenerative change thoracic spine.  Prior cervical fusion.  IMPRESSION: No active cardiopulmonary disease.  Cardiomegaly.  Stable from priors.   Original Report Authenticated By: Davonna Belling, M.D.   Ct Head Wo  Contrast  08/22/2012   *RADIOLOGY REPORT*  Clinical Data: The hypoglycemia.  Nausea.  Weakness.  History of stroke in May 2014.  CT HEAD WITHOUT CONTRAST  Technique:  Contiguous axial images were obtained from the base of the skull through the vertex without contrast.  Comparison: CT head 06/27/2012.  MRI brain 07/25/2012.  Findings: The ventricles and sulci are symmetrical without significant effacement, displacement, or dilatation. No mass effect or midline shift. No abnormal extra-axial fluid collections. The grey-white matter junction is distinct. Basal cisterns are not effaced. No acute intracranial hemorrhage. No depressed skull fractures.  Visualized paranasal sinuses and mastoid air cells are not opacified. Vascular calcifications.  No significant changes since previous study.  IMPRESSION: No acute intracranial abnormalities.   Original Report Authenticated By: Burman Nieves, M.D.  Mr Brain Wo Contrast  07/25/2012   *RADIOLOGY REPORT*  Clinical Data: Right side top of head pain with crawling sensation. Difficulty ambulating.  High blood pressure and diabetes.  MRI HEAD WITHOUT CONTRAST  Technique:  Multiplanar, multiecho pulse sequences of the brain and surrounding structures were obtained according to standard protocol without intravenous contrast.  Comparison: 06/27/2012 MR.  Findings: No acute infarct.  No intracranial hemorrhage.  Mild white matter type changes most likely related to result of small vessel disease in this diabetic hypertensive patient.  No hydrocephalus.  No intracranial mass lesion detected on this unenhanced exam. Para major intracranial vascular structures are patent.  Cervical medullary junction unremarkable.  Pituitary region, pineal region and orbital structures unremarkable.  IMPRESSION: No acute infarct.  Mild small vessel disease type changes.   Original Report Authenticated By: Lacy Duverney, M.D.       Date: 08/22/2012  Rate: 57  Rhythm: sinus bradycardia  QRS  Axis: normal  Intervals: normal  ST/T Wave abnormalities: nonspecific ST changes  Conduction Disutrbances:none  Narrative Interpretation:   Old EKG Reviewed: unchanged  IV fluids.  Medicine consult, discussed with Dr. Conley Rolls, plan admit for MRI  MDM  Dizziness with gait disturbance and history of TIA. No neuro deficits on exam  EKG - sinus rhythm  CT scan - no intracranial bleed  Labs reviewed - cbc and bmp WNL  Medical admit   Sunnie Nielsen, MD 08/22/12 [phone]

## 2012-08-22 NOTE — H&P (Signed)
[facility] History and Physical  KIMMBERLY WISSER ZOX:[id number] DOB: [birth year]    PCP:   Willow Ora, MD   Chief Complaint: vertigo.  HPI: Samantha [name] is an 72 y.o. female with hx of TIA with complete work up 2 months ago, OSA, hyperlipidemia, HTN, fibromyalgia, thyroid tumor s/p thyroidectomy, ocular migraine, presents to the ER with episodes of vertigo.  She said the vertigo came acutely and resolved spontaneously.  There were no vertigo with head movement.  She has had no slurred speech, facial drop nor any focal weaknesses.  She has blurry Vx but did say that her "TV was moving back and forth as if it was shaking":  Evaluation in the ER included a head CT which was negative, Serology was unremarkable.  Hospitalist was asked to admit her for futher work up.  Rewiew of Systems:  Constitutional: Negative for malaise, fever and chills. No significant weight loss or weight gain Eyes: Negative for eye pain, redness and discharge, diplopia, visual changes, or flashes of light. ENMT: Negative for ear pain, hoarseness, nasal congestion, sinus pressure and sore throat. No headaches; tinnitus, drooling, or problem swallowing. Cardiovascular: Negative for chest pain, palpitations, diaphoresis, dyspnea and peripheral edema. ; No orthopnea, PND Respiratory: Negative for cough, hemoptysis, wheezing and stridor. No pleuritic chestpain. Gastrointestinal: Negative for nausea, vomiting, diarrhea, constipation, abdominal pain, melena, blood in stool, hematemesis, jaundice and rectal bleeding.    Genitourinary: Negative for frequency, dysuria, incontinence,flank pain and hematuria; Musculoskeletal: Negative for back pain and neck pain. Negative for swelling and trauma.;  Skin: . Negative for pruritus, rash, abrasions, bruising and skin lesion.; ulcerations Neuro: Negative for headache,  and neck stiffness. Negative for weakness, altered level of consciousness , altered mental status, extremity  weakness, burning feet, involuntary movement, seizure and syncope.  Psych: negative for anxiety, depression, insomnia, tearfulness, panic attacks, hallucinations, paranoia, suicidal or homicidal ideation    Past Medical History  Diagnosis Date  . OSA (obstructive sleep apnea) 09/2007    dx w/ a sleep study, Rx CPAP, weight loss  . Hyperlipidemia   . Hypertension   . Chest pain, atypical 11/2006    saw cards: neg/normal Holter, ECHo and myoview, admitted w/ CP 4/09 neg enz.and CT chest had a 9 beat NSVT  . Fibromyalgia   . Abnormal CT of the chest 2008    follow up CT, April 2009: unchanged. No f/u suggested   . 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 Shingles 11/2009  . Reactive airway disease   . Vaginal dysplasia   . Vaginal cancer 1994  . Type II diabetes mellitus   . Heart murmur   . CHF (congestive heart failure)   . Pneumonia 2003    "double" (06/27/2012)  . Pneumonia 1990's    "once in one lung" (06/27/2012)  . History of chronic bronchitis   . Shortness of breath     "can happen at any time" (06/27/2012)  . Asthma   . H/O hiatal hernia   . GERD (gastroesophageal reflux disease)   . Ocular migraine   . TIA (transient ischemic attack) 06/27/2012    "this is my first" (06/27/2012)  . Arthritis     "all over" (06/27/2012)  . Rheumatoid factor positive   . Osteoarthritis     Past Surgical History  Procedure Laterality Date  . Thyroidectomy, partial  1960's  . Bunionectomy Left ~ 1977  . Breast biopsy Right 1999  . Anterior cervical decomp/discectomy fusion  2001  . Cataract extraction w/ intraocular lens  implant, bilateral  2012  . Vaginal mass excision  1994    "Laser surgery for vaginal cancer; followed by chemotherapy" (06/27/2012)  . Abdominal hysterectomy  1980    Medications:  HOME MEDS: Prior to Admission medications   Medication Sig Start Date End Date Taking? Authorizing Provider  albuterol (VENTOLIN HFA) 108 (90 BASE) MCG/ACT inhaler  Inhale 2 puffs into the lungs every 6 (six) hours as needed. 05/07/11  Yes Wanda Plump, MD  CALCIUM PO Take 1 tablet by mouth daily.     Yes Historical Provider, MD  cetirizine (ZYRTEC) 10 MG tablet Take 1 tablet (10 mg total) by mouth daily. 05/06/12  Yes Wanda Plump, MD  clopidogrel (PLAVIX) 75 MG tablet Take 1 tablet (75 mg total) by mouth daily with breakfast. 06/28/12  Yes Rhetta Mura, MD  furosemide (LASIX) 20 MG tablet Take 20 mg by mouth 2 (two) times daily.   Yes Historical Provider, MD  lisinopril (PRINIVIL,ZESTRIL) 40 MG tablet Take 1 tablet (40 mg total) by mouth daily. 02/11/12  Yes Gaylord Shih, MD  metFORMIN (GLUCOPHAGE-XR) 500 MG 24 hr tablet Take 500 mg by mouth daily with breakfast.   Yes Historical Provider, MD  Multiple Vitamin (MULTIVITAMIN) capsule Take 1 capsule by mouth daily.     Yes Historical Provider, MD  Nebivolol HCl (BYSTOLIC) 20 MG TABS Take 20 mg by mouth every morning.   Yes Historical Provider, MD  NON FORMULARY Take 1 tablet by mouth daily. Osteo-biflex   Yes Historical Provider, MD  Polyethyl Glycol-Propyl Glycol (SYSTANE) 0.4-0.3 % GEL Apply 1 drop to eye daily as needed (dry eyes).   Yes Historical Provider, MD  Potassium 99 MG TABS Take 99 mg by mouth daily.    Yes Historical Provider, MD  pravastatin (PRAVACHOL) 40 MG tablet Take 40 mg by mouth every evening.   Yes Historical Provider, MD  ranitidine (ZANTAC) 300 MG tablet Take 1 tablet (300 mg total) by mouth at bedtime. 07/08/12  Yes Wanda Plump, MD  pregabalin (LYRICA) 100 MG capsule Take 100 mg by mouth at bedtime as needed (for sleep).    Historical Provider, MD     Allergies:  Allergies  Allergen Reactions  . Cefuroxime Axetil     REACTION: urticaria (hives)  . Oxycodone Nausea And Vomiting  . Seldane (Terfenadine)     REACTION: urticaria (hives)  . Tramadol     REACTION: vomitting    Social History:   reports that she has quit smoking. Her smoking use included Cigarettes. She smoked 0.00  packs per day. She has never used smokeless tobacco. She reports that she does not drink alcohol or use illicit drugs.  Family History: Family History  Problem Relation Age of Onset  . Allergies Sister   . Asthma Sister   . Asthma Paternal Grandmother   . Heart disease Father   . Heart disease Mother   . Lung cancer Mother   . Heart disease      paternal grandparents, maternal grandparents,   . Heart disease Brother   . Breast cancer Neg Hx   . Colon cancer Neg Hx      Physical Exam: Filed Vitals:   08/22/12 0100 08/22/12 0251 08/22/12 0300 08/22/12 0338  BP: 142/66   144/66  Pulse: 56  56 55  Temp:  97.8 F (36.6 C)  97.4 F (36.3 C)  TempSrc:    Oral  Resp:   19  18  Height:    5\' 3"  (1.6 m)  Weight:    87.408 kg (192 lb 11.2 oz)  SpO2: 100%  100% 99%   Blood pressure 144/66, pulse 55, temperature 97.4 F (36.3 C), temperature source Oral, resp. rate 18, height 5\' 3"  (1.6 m), weight 87.408 kg (192 lb 11.2 oz), SpO2 99.00%.  GEN:  Pleasant  patient lying in the stretcher in no acute distress; cooperative with exam. PSYCH:  alert and oriented x4; does not appear anxious or depressed; affect is appropriate. HEENT: Mucous membranes pink and anicteric; PERRLA; EOM intact; no cervical lymphadenopathy nor thyromegaly or carotid bruit; no JVD; There were no stridor. Neck is very supple. Breasts:: Not examined CHEST WALL: No tenderness CHEST: Normal respiration, clear to auscultation bilaterally.  HEART: Regular rate and rhythm.  There are no murmur, rub, or gallops.   BACK: No kyphosis or scoliosis; no CVA tenderness ABDOMEN: soft and non-tender; no masses, no organomegaly, normal abdominal bowel sounds; no pannus; no intertriginous candida. There is no rebound and no distention. Rectal Exam: Not done EXTREMITIES: No bone or joint deformity; age-appropriate arthropathy of the hands and knees; no edema; no ulcerations.  There is no calf tenderness. Genitalia: not  examined PULSES: 2+ and symmetric SKIN: Normal hydration no rash or ulceration CNS: Cranial nerves 2-12 grossly intact no focal lateralizing neurologic deficit.  Speech is fluent; uvula elevated with phonation, facial symmetry and tongue midline. DTR are normal bilaterally, cerebella exam is intact, barbinski is negative and strengths are equaled bilaterally.  No sensory loss.   Labs on Admission:  Basic Metabolic Panel:  Recent Labs Lab 08/22/12 0056  NA 139  K 3.8  CL 104  GLUCOSE 94  BUN 19  CREATININE 1.00   Liver Function Tests: No results found for this basename: AST, ALT, ALKPHOS, BILITOT, PROT, ALBUMIN,  in the last 168 hours No results found for this basename: LIPASE, AMYLASE,  in the last 168 hours No results found for this basename: AMMONIA,  in the last 168 hours CBC:  Recent Labs Lab 08/22/12 0050 08/22/12 0056  WBC 4.3  --   HGB 13.0 12.9  HCT 37.2 38.0  MCV 81.2  --   PLT 140*  --    Cardiac Enzymes: No results found for this basename: CKTOTAL, CKMB, CKMBINDEX, TROPONINI,  in the last 168 hours  CBG:  Recent Labs Lab 08/22/12 0023  GLUCAP 97     Radiological Exams on Admission: Ct Head Wo Contrast  08/22/2012   *RADIOLOGY REPORT*  Clinical Data: The hypoglycemia.  Nausea.  Weakness.  History of stroke in May 2014.  CT HEAD WITHOUT CONTRAST  Technique:  Contiguous axial images were obtained from the base of the skull through the vertex without contrast.  Comparison: CT head 06/27/2012.  MRI brain 07/25/2012.  Findings: The ventricles and sulci are symmetrical without significant effacement, displacement, or dilatation. No mass effect or midline shift. No abnormal extra-axial fluid collections. The grey-white matter junction is distinct. Basal cisterns are not effaced. No acute intracranial hemorrhage. No depressed skull fractures.  Visualized paranasal sinuses and mastoid air cells are not opacified. Vascular calcifications.  No significant changes since  previous study.  IMPRESSION: No acute intracranial abnormalities.   Original Report Authenticated By: Burman Nieves, M.D.    Assessment/Plan Present on Admission:  . TIA (transient ischemic attack) . DIABETES MELLITUS, TYPE II . HYPERTENSION . HYPERLIPIDEMIA . VBI (vertebrobasilar insufficiency)  PLAN:  I am concerned that her symptoms may be VBI  of the posterior circulation.  She did have complete work up 2 months ago, so none should be repeated.  Will continue her on Plavix, and obtain MRI of the brain.  If it is negative, I don't think any further work up is indicated.  Her home meds for HTN and hyperlipidemia will be continued.  For her DM, will continue her metformin and use SSI as needed.  I have consulted OT/PT to assess her gain.  She is stable, full code, and will be admitted to [facility] service. Thank you for allowing me to partake in the care of your patient.  Other plans as per orders.  Code Status: FULL Unk Lightning, MD. [facility] Pager [phone] 7pm to 7am.  08/22/2012, 5:28 AM

## 2012-08-22 NOTE — Care Management Note (Signed)
    Page 1 of 1   08/22/2012     3:49:08 PM   CARE MANAGEMENT NOTE 08/22/2012  Patient:  BELA, BONAPARTE   Account Number:  [id number]  Date Initiated:  08/22/2012  Documentation initiated by:  Elmer Bales  Subjective/Objective Assessment:   Pt admitted for TIA. 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   Anticipated DC Date:  08/22/2012   Anticipated DC Plan:  HOME W HOME HEALTH SERVICES      DC Planning Services  CM consult      Choice offered to / List presented to:  C-1 Patient        HH arranged  HH-2 PT  HH-3 OT      [facility] agency  [facility]   Status of service:  Completed, signed off Medicare Important Message given?   (If response is "NO", the following Medicare IM given date fields will be blank) Date Medicare IM given:   Date Additional Medicare IM given:    Discharge Disposition:  HOME W HOME HEALTH SERVICES  Per UR Regulation:  Reviewed for med. necessity/level of care/duration of stay  If discussed at Long Length of Stay Meetings, dates discussed:    Comments:  08/22/12 1545 Elmer Bales RN, MSN, CM-  met with patient to discuss home health needs.  Pt has chosen to use [facility] for PT/OT.  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  Pt was given a list of private duty agencies to assist with her husband's care.  Mary with [facility] was notified and has accepted the referral.

## 2012-08-22 NOTE — Discharge Summary (Signed)
Physician Discharge Summary  Samantha [name]:[id number] DOB: 1940-09-05 DOA: 08/22/2012  PCP: Willow Ora, MD  Admit date: 08/22/2012 Discharge date: 08/22/2012  Time spent: 35 minutes  Recommendations for Outpatient Follow-up:  1. Home health  Discharge Diagnoses:  Active Problems:   DIABETES MELLITUS, TYPE II   HYPERLIPIDEMIA   HYPERTENSION   TIA (transient ischemic attack)   VBI (vertebrobasilar insufficiency)   Discharge Condition: improved  Diet recommendation: diabetic/cardiac  Filed Weights   08/22/12 0338  Weight: 87.408 kg (192 lb 11.2 oz)    History of present illness:  Samantha [name] is an 72 y.o. female with hx of TIA with complete work up 2 months ago, OSA, hyperlipidemia, HTN, fibromyalgia, thyroid tumor s/p thyroidectomy, ocular migraine, presents to the ER with episodes of vertigo. She said the vertigo came acutely and resolved spontaneously. There were no vertigo with head movement. She has had no slurred speech, facial drop nor any focal weaknesses. She has blurry Vx but did say that her "TV was moving back and forth as if it was shaking": Evaluation in the ER included a head CT which was negative, Serology was unremarkable. Hospitalist was asked to admit her for futher work up.   Hospital Course:  MRI negative- u/a negative; PT/OT recommends home health; intermittent supervision by family  Procedures:    Consultations:  none  Discharge Exam: Filed Vitals:   08/22/12 0338 08/22/12 0537 08/22/12 1028 08/22/12 1342  BP: 144/66 141/72 149/87 133/62  Pulse: 55 67 57 60  Temp: 97.4 F (36.3 C) 97.4 F (36.3 C) 97.4 F (36.3 C) 98.2 F (36.8 C)  TempSrc: Oral Oral Oral Oral  Resp: 18 18 18 16   Height: 5\' 3"  (1.6 m)     Weight: 87.408 kg (192 lb 11.2 oz)     SpO2: 99% 100% 100% 100%    General: A+Ox3, NAD Cardiovascular: rrr Respiratory: clear  Discharge Instructions  Discharge Orders   Future Appointments Provider Department Dept  Phone   08/29/2012 2:00 PM Wanda Plump, MD [facility]  [place] [phone]   11/27/2012 1:30 PM Levert Feinstein, MD [facility] [phone]   Future Orders Complete By Expires     Diet - low sodium heart healthy  As directed     Diet Carb Modified  As directed     Discharge instructions  As directed     Comments:      Home health PT/OT    Increase activity slowly  As directed         Medication List    STOP taking these medications       NON FORMULARY      TAKE these medications       albuterol 108 (90 BASE) MCG/ACT inhaler  Commonly known as:  VENTOLIN HFA  Inhale 2 puffs into the lungs every 6 (six) hours as needed.     BYSTOLIC 20 MG Tabs  Generic drug:  Nebivolol HCl  Take 20 mg by mouth every morning.     CALCIUM PO  Take 1 tablet by mouth daily.     cetirizine 10 MG tablet  Commonly known as:  ZYRTEC  Take 1 tablet (10 mg total) by mouth daily.     clopidogrel 75 MG tablet  Commonly known as:  PLAVIX  Take 1 tablet (75 mg total) by mouth daily with breakfast.     furosemide 20 MG tablet  Commonly known as:  LASIX  Take 20 mg by mouth 2 (  two) times daily.     lisinopril 40 MG tablet  Commonly known as:  PRINIVIL,ZESTRIL  Take 1 tablet (40 mg total) by mouth daily.     metFORMIN 500 MG 24 hr tablet  Commonly known as:  GLUCOPHAGE-XR  Take 500 mg by mouth daily with breakfast.     multivitamin capsule  Take 1 capsule by mouth daily.     Potassium 99 MG Tabs  Take 99 mg by mouth daily.     pravastatin 40 MG tablet  Commonly known as:  PRAVACHOL  Take 40 mg by mouth every evening.     pregabalin 100 MG capsule  Commonly known as:  LYRICA  Take 100 mg by mouth at bedtime as needed (for sleep).     ranitidine 300 MG tablet  Commonly known as:  ZANTAC  Take 1 tablet (300 mg total) by mouth at bedtime.     SYSTANE 0.4-0.3 % Gel  Generic drug:  Polyethyl Glycol-Propyl Glycol  Apply 1 drop to eye daily as needed  (dry eyes).       Allergies  Allergen Reactions  . Cefuroxime Axetil     REACTION: urticaria (hives)  . Oxycodone Nausea And Vomiting  . Seldane (Terfenadine)     REACTION: urticaria (hives)  . Tramadol     REACTION: vomitting      The results of significant diagnostics from this hospitalization (including imaging, microbiology, ancillary and laboratory) are listed below for reference.    Significant Diagnostic Studies: Dg Chest 2 View  07/24/2012   *RADIOLOGY REPORT*  Clinical Data: Chest pain for 2 days.  CHF.  CHEST - 2 VIEW  Comparison: 05/04/2011.  Findings: Mild cardiomegaly.    No infiltrates or failure.  No effusion or pneumothorax.  Tortuous aorta. Degenerative change thoracic spine.  Prior cervical fusion.  IMPRESSION: No active cardiopulmonary disease.  Cardiomegaly.  Stable from priors.   Original Report Authenticated By: Davonna Belling, M.D.   Ct Head Wo Contrast  08/22/2012   *RADIOLOGY REPORT*  Clinical Data: The hypoglycemia.  Nausea.  Weakness.  History of stroke in May 2014.  CT HEAD WITHOUT CONTRAST  Technique:  Contiguous axial images were obtained from the base of the skull through the vertex without contrast.  Comparison: CT head 06/27/2012.  MRI brain 07/25/2012.  Findings: The ventricles and sulci are symmetrical without significant effacement, displacement, or dilatation. No mass effect or midline shift. No abnormal extra-axial fluid collections. The grey-white matter junction is distinct. Basal cisterns are not effaced. No acute intracranial hemorrhage. No depressed skull fractures.  Visualized paranasal sinuses and mastoid air cells are not opacified. Vascular calcifications.  No significant changes since previous study.  IMPRESSION: No acute intracranial abnormalities.   Original Report Authenticated By: Burman Nieves, M.D.   Mri Brain Without Contrast  08/22/2012   *RADIOLOGY REPORT*  Clinical Data: Diabetic hypertensive hyperlipidemic patient with history of  migraines presenting with episodes of vertigo and blurred vision.  MRI HEAD WITHOUT CONTRAST  Technique:  Multiplanar, multiecho pulse sequences of the brain and surrounding structures were obtained according to standard protocol without intravenous contrast.  Comparison: 08/22/2012 CT.  07/25/2012 and 06/27/2012 MR.  Findings: No acute infarct.  Mild white matter type changes most likely related to result of small vessel disease in this diabetic hypertensive hyperlipidemic patient.  No intracranial hemorrhage.  No intracranial mass lesion detected on this unenhanced exam.  Major intracranial vascular structures are patent.  Minimal mucosal thickening paranasal sinuses most notable inferior aspect right maxillary  sinus.  Prior cervical spine surgery.  Cervical medullary junction, pituitary region, pineal region and orbital structures unremarkable.  IMPRESSION: No acute abnormality.  Please see above.   Original Report Authenticated By: Lacy Duverney, M.D.   Mr Brain Wo Contrast  07/25/2012   *RADIOLOGY REPORT*  Clinical Data: Right side top of head pain with crawling sensation. Difficulty ambulating.  High blood pressure and diabetes.  MRI HEAD WITHOUT CONTRAST  Technique:  Multiplanar, multiecho pulse sequences of the brain and surrounding structures were obtained according to standard protocol without intravenous contrast.  Comparison: 06/27/2012 MR.  Findings: No acute infarct.  No intracranial hemorrhage.  Mild white matter type changes most likely related to result of small vessel disease in this diabetic hypertensive patient.  No hydrocephalus.  No intracranial mass lesion detected on this unenhanced exam. Para major intracranial vascular structures are patent.  Cervical medullary junction unremarkable.  Pituitary region, pineal region and orbital structures unremarkable.  IMPRESSION: No acute infarct.  Mild small vessel disease type changes.   Original Report Authenticated By: Lacy Duverney, M.D.     Microbiology: No results found for this or any previous visit (from the past 240 hour(s)).   Labs: Basic Metabolic Panel:  Recent Labs Lab 08/22/12 0056 08/22/12 0900  NA 139  --   K 3.8  --   CL 104  --   GLUCOSE 94  --   BUN 19  --   CREATININE 1.00 0.79   Liver Function Tests: No results found for this basename: AST, ALT, ALKPHOS, BILITOT, PROT, ALBUMIN,  in the last 168 hours No results found for this basename: LIPASE, AMYLASE,  in the last 168 hours No results found for this basename: AMMONIA,  in the last 168 hours CBC:  Recent Labs Lab 08/22/12 0050 08/22/12 0056 08/22/12 0900  WBC 4.3  --  3.2*  HGB 13.0 12.9 12.5  HCT 37.2 38.0 35.7*  MCV 81.2  --  81.1  PLT 140*  --  128*   Cardiac Enzymes: No results found for this basename: CKTOTAL, CKMB, CKMBINDEX, TROPONINI,  in the last 168 hours BNP: BNP (last 3 results)  Recent Labs  07/24/12 1734  PROBNP 113.3   CBG:  Recent Labs Lab 08/22/12 0023 08/22/12 0718 08/22/12 1131  GLUCAP 97 94 98       Signed:  Reginal Wojcicki  [facility] 08/22/2012, 2:51 PM

## 2012-08-25 ENCOUNTER — Ambulatory Visit: Payer: Medicare Other | Admitting: Physician Assistant

## 2012-08-27 ENCOUNTER — Ambulatory Visit: Payer: Medicare Other | Admitting: Physical Therapy

## 2012-08-27 ENCOUNTER — Encounter: Payer: Self-pay | Admitting: *Deleted

## 2012-08-27 ENCOUNTER — Encounter (INDEPENDENT_AMBULATORY_CARE_PROVIDER_SITE_OTHER): Payer: Medicare Other

## 2012-08-27 DIAGNOSIS — R002 Palpitations: Secondary | ICD-10-CM | POA: Diagnosis not present

## 2012-08-27 NOTE — Progress Notes (Signed)
Patient ID: Samantha [name], female   DOB: [birth year], 72 y.o.   MRN: [id number] E-Cardio 48 Hour Holter monitor applied to patient.

## 2012-08-29 ENCOUNTER — Ambulatory Visit (INDEPENDENT_AMBULATORY_CARE_PROVIDER_SITE_OTHER): Payer: Medicare Other | Admitting: Internal Medicine

## 2012-08-29 ENCOUNTER — Encounter: Payer: Self-pay | Admitting: Internal Medicine

## 2012-08-29 VITALS — BP 140/78 | HR 64 | Wt 189.8 lb

## 2012-08-29 DIAGNOSIS — R002 Palpitations: Secondary | ICD-10-CM

## 2012-08-29 DIAGNOSIS — G459 Transient cerebral ischemic attack, unspecified: Secondary | ICD-10-CM | POA: Diagnosis not present

## 2012-08-29 NOTE — Assessment & Plan Note (Signed)
See previous entry, he still has occasional palpitations, holter  was finish today, reading pending.

## 2012-08-29 NOTE — Patient Instructions (Addendum)
Continue taking the same medications, I will call if you  need to restart aspirin. Rest, drink plenty of fluids, if not improving in the next few days let us know. Next visit in 2 or 3 months.

## 2012-08-29 NOTE — Progress Notes (Signed)
Subjective:    Patient ID: Samantha [name], female    DOB: 04/23/1940, 72 y.o.   MRN: [id number]  HPI Hospital followup, Here with her sister Admitted to the hospital 08/22/2012 after a episodes of dizzines  that resolved spontaneously. Workup included normal electrolytes and creatinine, LDL 52, hemoglobin 12.5, hemoglobin A1c 6.2. MRI of the brain showed no acute findings . Physical therapy recommended:  home health and supervision by the family.  Past Medical History  Diagnosis Date  . OSA (obstructive sleep apnea) 09/2007    dx w/ a sleep study, Rx CPAP, weight loss  . Hyperlipidemia   . Hypertension   . Chest pain, atypical 11/2006    saw cards: neg/normal Holter, ECHo and myoview, admitted w/ CP 4/09 neg enz.and CT chest had a 9 beat NSVT  . Fibromyalgia   . Abnormal CT of the chest 2008    follow up CT, April 2009: unchanged. No f/u suggested   . 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 Shingles 11/2009  . Reactive airway disease   . Vaginal dysplasia   . Vaginal cancer 1994  . Type II diabetes mellitus   . Heart murmur   . CHF (congestive heart failure)   . Pneumonia 2003    "double" (06/27/2012)  . Pneumonia 1990's    "once in one lung" (06/27/2012)  . History of chronic bronchitis   . Shortness of breath     "can happen at any time" (06/27/2012)  . Asthma   . H/O hiatal hernia   . GERD (gastroesophageal reflux disease)   . Ocular migraine   . TIA (transient ischemic attack) 06/27/2012    "this is my first" (06/27/2012)  . Arthritis     "all over" (06/27/2012)  . Rheumatoid factor positive   . Osteoarthritis    Past Surgical History  Procedure Laterality Date  . Thyroidectomy, partial  1960's  . Bunionectomy Left ~ 1977  . Breast biopsy Right 1999  . Anterior cervical decomp/discectomy fusion  2001  . Cataract extraction w/ intraocular lens  implant, bilateral  2012  . Vaginal mass excision  1994    "Laser surgery for vaginal cancer; followed  by chemotherapy" (06/27/2012)  . Abdominal hysterectomy  1980   History   Social History  . Marital Status: Married    Spouse Name: Adela Glimpse    Number of Children: 2  . Years of Education: masters   Occupational History  . Retired    Social History Main Topics  . Smoking status: Former Smoker    Types: Cigarettes  . Smokeless tobacco: Never Used     Comment: Quit in 2001  . Alcohol Use: No  . Drug Use: No  . Sexually Active: No   Other Topics Concern  . Not on file   Social History Narrative   On disability since 2000--- also husband has MS   Review of Systems Since she left the hospital,  is not feeling completely well: "My head is not right", states she feels off balance, heaviness behind the eyes. Mild dizziness which is a steady. Denies any fever, chills, weight loss or myalgias. Not sinus pain or sinus discharge. Specifically no focal weaknesses, slurred speech or facial paresthesias.     Objective:   Physical Exam BP 140/78  Pulse 64  Wt 189 lb 12.8 oz (86.093 kg)  BMI 33.63 kg/m2  SpO2 98%  General -- alert, well-developed,NAD   Lungs -- normal respiratory effort, no  intercostal retractions, no accessory muscle use, and normal breath sounds.   Heart-- normal rate, regular rhythm, no murmur, and no gallop.   extremities-- no pretibial edema bilaterally  Neurologic-- alert & oriented X3 ;Face symmetric, EOMI, gait normal, speech normal, motor symmetric Psych-- Cognition and judgment appear intact. Alert and cooperative with normal attention span and concentration.  not anxious appearing and not depressed appearing.      Assessment & Plan:

## 2012-08-29 NOTE — Assessment & Plan Note (Addendum)
Recently admitted with dizziness felt to be possibly a TIA. Continue with Some symptoms ("off balance, heaviness behind the eyes"). Neurological exam remains at baseline. Her diabetes, cholesterol and blood pressure are well-controlled Plan:  Will contact neurology, restart aspirin?. Addendum-- spoke w/ Dr Terrace Arabia nurse, after consultation w/ a neurologist they rec not to add ASA ) Was referred for home health By the hospital team, and that is pending.

## 2012-09-03 ENCOUNTER — Ambulatory Visit: Payer: Medicare Other | Admitting: Physical Therapy

## 2012-09-03 DIAGNOSIS — E119 Type 2 diabetes mellitus without complications: Secondary | ICD-10-CM | POA: Diagnosis not present

## 2012-09-03 DIAGNOSIS — IMO0001 Reserved for inherently not codable concepts without codable children: Secondary | ICD-10-CM | POA: Diagnosis not present

## 2012-09-03 DIAGNOSIS — Z8673 Personal history of transient ischemic attack (TIA), and cerebral infarction without residual deficits: Secondary | ICD-10-CM | POA: Diagnosis not present

## 2012-09-03 DIAGNOSIS — I10 Essential (primary) hypertension: Secondary | ICD-10-CM | POA: Diagnosis not present

## 2012-09-03 DIAGNOSIS — G450 Vertebro-basilar artery syndrome: Secondary | ICD-10-CM | POA: Diagnosis not present

## 2012-09-05 ENCOUNTER — Telehealth: Payer: Self-pay | Admitting: Neurology

## 2012-09-05 NOTE — Telephone Encounter (Signed)
Spoke to Dr. Drue Novel about patient who stated patient was recently at hospital with dizziness, felt to be a possible TIA.  The patient is currently taking Plavix and would like to know from neurology if Aspirin should be restarted.

## 2012-09-05 NOTE — Telephone Encounter (Signed)
I spoke to [facility], Dr. Frances Furbish and she said not to restart the aspirin.  The patient has a history of ocular migraines which may be contributing to symptoms.  I spoke with Dr. Drue Novel and relayed message not to restart the aspirin.

## 2012-09-16 ENCOUNTER — Ambulatory Visit (INDEPENDENT_AMBULATORY_CARE_PROVIDER_SITE_OTHER): Payer: Medicare Other | Admitting: Physician Assistant

## 2012-09-16 ENCOUNTER — Encounter: Payer: Self-pay | Admitting: Physician Assistant

## 2012-09-16 VITALS — BP 150/92 | HR 65 | Ht 63.0 in | Wt 191.0 lb

## 2012-09-16 DIAGNOSIS — R002 Palpitations: Secondary | ICD-10-CM

## 2012-09-16 DIAGNOSIS — R079 Chest pain, unspecified: Secondary | ICD-10-CM | POA: Diagnosis not present

## 2012-09-16 DIAGNOSIS — I4949 Other premature depolarization: Secondary | ICD-10-CM

## 2012-09-16 DIAGNOSIS — I10 Essential (primary) hypertension: Secondary | ICD-10-CM | POA: Diagnosis not present

## 2012-09-16 DIAGNOSIS — I493 Ventricular premature depolarization: Secondary | ICD-10-CM

## 2012-09-16 DIAGNOSIS — E785 Hyperlipidemia, unspecified: Secondary | ICD-10-CM

## 2012-09-16 MED ORDER — NEBIVOLOL HCL 20 MG PO TABS: 20.0000 mg | ORAL_TABLET | ORAL | Status: DC

## 2012-09-16 NOTE — Progress Notes (Signed)
[address] [place], [place]  [address] Phone: [phone] Fax:  [phone]  Date:  09/16/2012   ID:  Samantha [name], Samantha [name] Nov 14, 1940, MRN [id number]  PCP:  Willow Ora, MD  Cardiologist:  Dr. Valera Castle     History of Present Illness: Samantha [name] is a 72 y.o. female who returns for evaluation of palpitations and chest discomfort.  She has a hx of DM2, HTN, HL, fibromyalgia, palpitations, LE edema. Nuclear study 11/08: Normal. Echocardiogram 3/14: Mild LVH, EF 55-65%, grade 2 diastolic dysfunction.  Patient was last seen by Dr. Daleen Squibb in 03/2012. He adjusted Lasix for LE edema at that time and recommended prn f/u. She was admitted 05/2012 for RUE symptoms suspected to be a possible TIA. Carotid US 5/14: Less than 39% bilateral ICA stenosis. MRI demonstrated no acute infarct. She had no significant disease on MRA. She was seen again in 7/14 for suspected vertigo/dizziness. Holter monitor x 24 hours 07/2012: NSR with PVCs.  She went to the emergency room in June with chest discomfort and palpitations. Chest x-ray was unremarkable. Cardiac enzymes were also normal. Her PCP arranged for her Holter monitor.  She has had palpitations for years. They seem to be increasing in intensity and frequency.  Her heartbeat feels irregular and sometimes it is racing. She did have symptoms while wearing the Holter monitor. She often gets dizzy and lightheaded. She's currently seeing physical therapy for poor balance. She denies syncope. She denies significant LE edema. She sleeps on several pillows and has done so for years. She denies PND. She notes chest discomfort with with more extreme activities. She describes this as pressure.  Labs (5/14):   K 3.7, creatinine 0.9, ALT 15 Labs (7/14):   K 3.8, creatinine 1.0, LDL 52, Hgb 12.5, PLT 128K, TSH 2.26, A1c 6.2  Wt Readings from Last 3 Encounters:  09/16/12 191 lb (86.637 kg)  08/29/12 189 lb 12.8 oz (86.093 kg)  08/22/12 192 lb 11.2  oz (87.408 kg)     Past Medical History  Diagnosis Date  . OSA (obstructive sleep apnea) 09/2007    dx w/ a sleep study, Rx CPAP, weight loss  . Hyperlipidemia   . Hypertension   . Chest pain, atypical 11/2006    saw cards: neg/normal Holter, ECHo and myoview, admitted w/ CP 4/09 neg enz.and CT chest had a 9 beat NSVT  . Fibromyalgia   . Abnormal CT of the chest 2008    follow up CT, April 2009: unchanged. No f/u suggested   . 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 Shingles 11/2009  . Reactive airway disease   . Vaginal dysplasia   . Vaginal cancer 1994  . Type II diabetes mellitus   . Heart murmur   . CHF (congestive heart failure)   . Pneumonia 2003    "double" (06/27/2012)  . Pneumonia 1990's    "once in one lung" (06/27/2012)  . History of chronic bronchitis   . Shortness of breath     "can happen at any time" (06/27/2012)  . Asthma   . H/O hiatal hernia   . GERD (gastroesophageal reflux disease)   . Ocular migraine   . TIA (transient ischemic attack) 06/27/2012    "this is my first" (06/27/2012)  . Arthritis     "all over" (06/27/2012)  . Rheumatoid factor positive   . Osteoarthritis     Current Outpatient Prescriptions  Medication Sig Dispense Refill  .  albuterol (VENTOLIN HFA) 108 (90 BASE) MCG/ACT inhaler Inhale 2 puffs into the lungs every 6 (six) hours as needed.  18 g  2  . CALCIUM PO Take 1 tablet by mouth daily.        . cetirizine (ZYRTEC) 10 MG tablet Take 1 tablet (10 mg total) by mouth daily.  90 tablet  1  . clopidogrel (PLAVIX) 75 MG tablet Take 1 tablet (75 mg total) by mouth daily with breakfast.  30 tablet  12  . fluticasone (FLONASE) 50 MCG/ACT nasal spray Place 2 sprays into the nose as needed for rhinitis.      . furosemide (LASIX) 20 MG tablet Take 20 mg by mouth 2 (two) times daily.      [name] lisinopril (PRINIVIL,ZESTRIL) 40 MG tablet Take 1 tablet (40 mg total) by mouth daily.  30 tablet  11  . metFORMIN (GLUCOPHAGE-XR) 500 MG 24 hr  tablet Take 500 mg by mouth daily with breakfast.      . Misc Natural Products (OSTEO BI-FLEX JOINT SHIELD) TABS Take 1 tablet by mouth daily.      . Multiple Vitamin (MULTIVITAMIN) capsule Take 1 capsule by mouth daily.        . Nebivolol HCl (BYSTOLIC) 20 MG TABS Take 20 mg by mouth every morning.      Bertram Gala Glycol-Propyl Glycol (SYSTANE) 0.4-0.3 % GEL Apply 1 drop to eye daily as needed (dry eyes).      . Potassium 99 MG TABS Take 99 mg by mouth daily.       . pravastatin (PRAVACHOL) 40 MG tablet Take 40 mg by mouth every evening.      . pregabalin (LYRICA) 100 MG capsule Take 100 mg by mouth at bedtime as needed (for sleep).      . ranitidine (ZANTAC) 300 MG tablet Take 1 tablet (300 mg total) by mouth at bedtime.  30 tablet  6   No current facility-administered medications for this visit.    Allergies:    Allergies  Allergen Reactions  . Cefuroxime Axetil     REACTION: urticaria (hives)  . Oxycodone Nausea And Vomiting  . Seldane [Terfenadine]     REACTION: urticaria (hives)  . Tramadol     REACTION: vomitting    Social History:  The patient  reports that she has quit smoking. Her smoking use included Cigarettes. She smoked 0.00 packs per day. She has never used smokeless tobacco. She reports that she does not drink alcohol or use illicit drugs.   ROS:  Please see the history of present illness.   She has a dry cough.   All other systems reviewed and negative.   PHYSICAL EXAM: VS:  BP 150/92  Pulse 65  Ht 5\' 3"  (1.6 m)  Wt 191 lb (86.637 kg)  BMI 33.84 kg/m2 Well nourished, well developed, in no acute distress HEENT: normal Neck: no JVD Cardiac:  normal S1, S2; RRR; no murmur Lungs:  clear to auscultation bilaterally, no wheezing, rhonchi or rales Abd: soft, nontender, no hepatomegaly Ext: no edema Skin: warm and dry Neuro:  CNs 2-12 intact, no focal abnormalities noted  EKG:  NSR, HR 65, normal axis, nonspecific ST-T wave changes, first degree AV block (PR  242)     ASSESSMENT AND PLAN:  1. Palpitations: She is likely experiencing symptomatic PVCs. Her blood pressure is uncontrolled. I have recommended adjusting her Bystolic to 20 mg in the morning and 10 mg in the evening to see if this provides symptomatic  relief. I provided her with reassurance today. I recommended that she try to refrain from caffeine and to avoid over the counter stimulants. Her husband is quite debilitated and I suspect stress is also playing a role. 2. Chest Pain: She has symptoms more extreme activities (CCS class 2). I suspect that her symptoms are more likely related to asthma. However, she is a diabetic (risk equivalent). She's not had any ischemic evaluation since 2008. She cannot walk on a treadmill due to hip pain. I will therefore have her undergo a Lexiscan Myoview. 3. Hypertension: Uncontrolled. I have recommended that she increase her Bystolic as noted above. 4. Hyperlipidemia: Continue statin. 5. Hx of TIA:  She remains on Plavix and has f/u with neurology.  6. Disposition: Follow up with me in one month.  Signed, Tereso Newcomer, PA-C  09/16/2012 9:01 AM

## 2012-09-16 NOTE — Patient Instructions (Addendum)
INCREASE BYSTOLIC TO 20 MG IN THE MORNING AND 10 MG IN THE EVENING MAKING SURE TO SPACE EACH DOSE 12 HOURS APART  SCHEDULE FOR LEXISCAN DX 786.50  PLEASE FOLLOW UP IN 1 MONTH WITH [address], PAC

## 2012-09-23 ENCOUNTER — Ambulatory Visit (HOSPITAL_COMMUNITY): Payer: Medicare Other | Attending: Cardiovascular Disease | Admitting: Radiology

## 2012-09-23 VITALS — BP 126/71 | HR 60 | Ht 63.0 in | Wt 188.0 lb

## 2012-09-23 DIAGNOSIS — E119 Type 2 diabetes mellitus without complications: Secondary | ICD-10-CM | POA: Insufficient documentation

## 2012-09-23 DIAGNOSIS — R0609 Other forms of dyspnea: Secondary | ICD-10-CM | POA: Diagnosis not present

## 2012-09-23 DIAGNOSIS — R110 Nausea: Secondary | ICD-10-CM | POA: Diagnosis not present

## 2012-09-23 DIAGNOSIS — R0989 Other specified symptoms and signs involving the circulatory and respiratory systems: Secondary | ICD-10-CM | POA: Diagnosis not present

## 2012-09-23 DIAGNOSIS — R0602 Shortness of breath: Secondary | ICD-10-CM

## 2012-09-23 DIAGNOSIS — R079 Chest pain, unspecified: Secondary | ICD-10-CM | POA: Diagnosis not present

## 2012-09-23 DIAGNOSIS — R5381 Other malaise: Secondary | ICD-10-CM | POA: Diagnosis not present

## 2012-09-23 DIAGNOSIS — R0789 Other chest pain: Secondary | ICD-10-CM | POA: Insufficient documentation

## 2012-09-23 DIAGNOSIS — E669 Obesity, unspecified: Secondary | ICD-10-CM | POA: Diagnosis not present

## 2012-09-23 DIAGNOSIS — R61 Generalized hyperhidrosis: Secondary | ICD-10-CM | POA: Diagnosis not present

## 2012-09-23 DIAGNOSIS — I4949 Other premature depolarization: Secondary | ICD-10-CM | POA: Diagnosis not present

## 2012-09-23 DIAGNOSIS — I10 Essential (primary) hypertension: Secondary | ICD-10-CM | POA: Insufficient documentation

## 2012-09-23 DIAGNOSIS — Z87891 Personal history of nicotine dependence: Secondary | ICD-10-CM | POA: Diagnosis not present

## 2012-09-23 DIAGNOSIS — I6529 Occlusion and stenosis of unspecified carotid artery: Secondary | ICD-10-CM | POA: Diagnosis not present

## 2012-09-23 MED ORDER — TECHNETIUM TC 99M SESTAMIBI GENERIC - CARDIOLITE
10.0000 | Freq: Once | INTRAVENOUS | Status: AC | PRN
  Administered 2012-09-23: 10 via INTRAVENOUS

## 2012-09-23 MED ORDER — TECHNETIUM TC 99M SESTAMIBI GENERIC - CARDIOLITE
30.0000 | Freq: Once | INTRAVENOUS | Status: AC | PRN
  Administered 2012-09-23: 30 via INTRAVENOUS

## 2012-09-23 MED ORDER — REGADENOSON 0.4 MG/5ML IV SOLN
0.4000 mg | Freq: Once | INTRAVENOUS | Status: AC
  Administered 2012-09-23: 0.4 mg via INTRAVENOUS

## 2012-09-23 NOTE — Progress Notes (Signed)
[facility] SITE 3 NUCLEAR MED [address] [place], [place] [address] [phone]    Cardiology Nuclear Med Study  Samantha [name] is a 72 y.o. female     MRN : [id number]     DOB: May 09, 1940  Procedure Date: 09/23/2012  Nuclear Med Background Indication for Stress Test:  Evaluation for Ischemia History:  '08 VHQ:IONGEX, EF=74%; 04/17/12 Echo:EF=65%, mild LVH Cardiac Risk Factors: Carotid Disease, History of Smoking, Hypertension, Lipids, NIDDM, Obesity and TIA  Symptoms:  Chest Pain, Chest Pressure with Exertion (last episode of chest discomfort was about 2-days ago), Diaphoresis, SOB/DOE, Fatigue and Nausea    Nuclear Pre-Procedure Caffeine/Decaff Intake:  None NPO After: 10:00pm   Lungs:  Clear. O2 Sat: 97% on room air. IV 0.9% NS with Angio Cath:  22g  IV Site: R Hand  IV Started by:  Cathlyn Parsons, RN  Chest Size (in):  40 Cup Size: D  Height: 5\' 3"  (1.6 m)  Weight:  188 lb (85.276 kg)  BMI:  Body mass index is 33.31 kg/(m^2). Tech Comments:  No Bystolic in 12 hrs,CBG this am 108,no Metformin,Albuterol used this am    Nuclear Med Study 1 or 2 day study: 1 day  Stress Test Type:  Lexiscan  Reading MD: Charlton Haws, MD  Order Authorizing Provider:  Tonny Bollman, MD; Tereso Newcomer, PA-C  Resting Radionuclide: Technetium 46m Sestamibi  Resting Radionuclide Dose: 10.6 mCi   Stress Radionuclide:  Technetium 52m Sestamibi  Stress Radionuclide Dose: 32.3 mCi           Stress Protocol Rest HR: 60 Stress HR: 88  Rest BP: 126/71 Stress BP: 124/70  Exercise Time (min): n/a METS: n/a   Predicted Max HR: 150 bpm % Max HR: 58.67 bpm Rate Pressure Product: 52841   Dose of Adenosine (mg):  n/a Dose of Lexiscan: 0.4 mg  Dose of Atropine (mg): n/a Dose of Dobutamine: n/a mcg/kg/min (at max HR)  Stress Test Technologist: Smiley Houseman, CMA-N  Nuclear Technologist:  Domenic Polite, CNMT     Rest Procedure:  Myocardial perfusion imaging was performed at rest  45 minutes following the intravenous administration of Technetium 37m Sestamibi.  Rest ECG: NSR - Normal EKG  Stress Procedure:  The patient received IV Lexiscan 0.4 mg over 15-seconds.  Technetium 29m Sestamibi injected at 30-seconds.  She c/o shortness of breath with Lexiscan.  Quantitative spect images were obtained after a 45 minute delay.  Stress ECG: No significant change from baseline ECG  QPS Raw Data Images:  Normal; no motion artifact; normal heart/lung ratio. Stress Images:  Normal homogeneous uptake in all areas of the myocardium. Rest Images:  Normal homogeneous uptake in all areas of the myocardium. Subtraction (SDS):  Normal Transient Ischemic Dilatation (Normal <1.22):  n/a Lung/Heart Ratio (Normal <0.45):  0.47  Quantitative Gated Spect Images QGS EDV:  50 ml QGS ESV:  8 ml  Impression Exercise Capacity:  Lexiscan with no exercise. BP Response:  Normal blood pressure response. Clinical Symptoms:  There is dyspnea. ECG Impression:  No significant ST segment change suggestive of ischemia. Comparison with Prior Nuclear Study: No images to compare  Overall Impression:  Normal stress nuclear study.  LV Ejection Fraction: 85%.  LV Wall Motion:  NL LV Function; NL Wall Motion  Charlton Haws

## 2012-09-24 ENCOUNTER — Telehealth: Payer: Self-pay | Admitting: *Deleted

## 2012-09-24 ENCOUNTER — Other Ambulatory Visit: Payer: Self-pay | Admitting: Internal Medicine

## 2012-09-24 NOTE — Telephone Encounter (Signed)
Message copied by Tarri Fuller on Wed Sep 24, 2012  2:22 PM ------      Message from: [place], [place] T      Created: Wed Sep 24, 2012  1:21 PM       Please inform patient stress test normal.      Tereso Newcomer, PA-C  1:21 PM 09/24/2012 ------

## 2012-09-24 NOTE — Telephone Encounter (Signed)
pt notified about normal myoview with verbal understanding 

## 2012-09-25 MED ORDER — PRAVASTATIN SODIUM 40 MG PO TABS: 40.0000 mg | ORAL_TABLET | Freq: Every evening | ORAL | Status: DC

## 2012-09-25 NOTE — Telephone Encounter (Signed)
Please advise? Medication covered by insurance. Need replacement rx.

## 2012-09-30 ENCOUNTER — Other Ambulatory Visit: Payer: Self-pay | Admitting: Internal Medicine

## 2012-09-30 NOTE — Telephone Encounter (Signed)
Refill done.  

## 2012-10-06 ENCOUNTER — Other Ambulatory Visit: Payer: Self-pay | Admitting: *Deleted

## 2012-10-06 DIAGNOSIS — J45901 Unspecified asthma with (acute) exacerbation: Secondary | ICD-10-CM

## 2012-10-06 MED ORDER — ALBUTEROL SULFATE HFA 108 (90 BASE) MCG/ACT IN AERS: 2.0000 | INHALATION_SPRAY | Freq: Four times a day (QID) | RESPIRATORY_TRACT | Status: DC | PRN

## 2012-10-06 MED ORDER — ALBUTEROL SULFATE HFA 108 (90 BASE) MCG/ACT IN AERS: INHALATION_SPRAY | RESPIRATORY_TRACT | Status: DC

## 2012-10-06 NOTE — Telephone Encounter (Signed)
RF for ventolin sent to [facility] on [address] in [place]

## 2012-10-17 ENCOUNTER — Ambulatory Visit (INDEPENDENT_AMBULATORY_CARE_PROVIDER_SITE_OTHER): Payer: Medicare Other | Admitting: Physician Assistant

## 2012-10-17 ENCOUNTER — Encounter: Payer: Self-pay | Admitting: Physician Assistant

## 2012-10-17 VITALS — BP 140/80 | HR 62 | Ht 63.0 in | Wt 192.8 lb

## 2012-10-17 DIAGNOSIS — I10 Essential (primary) hypertension: Secondary | ICD-10-CM

## 2012-10-17 DIAGNOSIS — I493 Ventricular premature depolarization: Secondary | ICD-10-CM

## 2012-10-17 DIAGNOSIS — I4949 Other premature depolarization: Secondary | ICD-10-CM

## 2012-10-17 NOTE — Progress Notes (Signed)
[address] [place], [place]  [address] Phone: [phone] Fax:  [phone]  Date:  10/17/2012   ID:  Samantha [name] 12-Aug-1940, MRN [id number]  PCP:  Willow Ora, MD  Cardiologist:  Dr. Valera Castle     History of Present Illness: Samantha [name] is a 72 y.o. female who returns for f/u  She has a hx of DM2, HTN, HL, fibromyalgia, palpitations, LE edema. Nuclear study 11/08: Normal. Echocardiogram 3/14: Mild LVH, EF 55-65%, grade 2 diastolic dysfunction.  Admitted 05/2012 for RUE symptoms suspected to be a possible TIA. Carotid US 5/14:  < 39% bilateral ICA stenosis. MRI demonstrated no acute infarct. She had no significant disease on MRA. She was seen again in 7/14 for suspected vertigo/dizziness.   She went to the emergency room in June with chest discomfort and palpitations. Chest x-ray was unremarkable. Cardiac enzymes were also normal. She has had palpitations for years. Holter monitor x 24 hours 07/2012: NSR with PVCs.  She did have symptoms while wearing the Holter monitor.  I saw her in follow up 09/16/12.  Her BP was elevated.  I adjusted her beta blocker. I arranged stress testing for evaluation of her chest pain. Lexiscan Myoview 09/24/12 was normal.  She is feeling better.  The patient denies chest pain, significant shortness of breath, syncope, orthopnea, PND or significant pedal edema.  Palpitations are improved.   Labs (5/14):   K 3.7, creatinine 0.9, ALT 15 Labs (7/14):   K 3.8, creatinine 1.0, LDL 52, Hgb 12.5, PLT 128K, TSH 2.26, A1c 6.2  Wt Readings from Last 3 Encounters:  10/17/12 192 lb 12.8 oz (87.454 kg)  09/23/12 188 lb (85.276 kg)  09/16/12 191 lb (86.637 kg)     Past Medical History  Diagnosis Date  . OSA (obstructive sleep apnea) 09/2007    dx w/ a sleep study, Rx CPAP, weight loss  . Hyperlipidemia   . Hypertension   . Chest pain, atypical 11/2006    saw cards: neg/normal Holter, ECHo and myoview, admitted w/ CP 4/09 neg  enz.and CT chest had a 9 beat NSVT  . Fibromyalgia   . Abnormal CT of the chest 2008    follow up CT, April 2009: unchanged. No f/u suggested   . 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 Shingles 11/2009  . Reactive airway disease   . Vaginal dysplasia   . Vaginal cancer 1994  . Type II diabetes mellitus   . Heart murmur   . CHF (congestive heart failure)   . Pneumonia 2003    "double" (06/27/2012)  . Pneumonia 1990's    "once in one lung" (06/27/2012)  . History of chronic bronchitis   . Shortness of breath     "can happen at any time" (06/27/2012)  . Asthma   . H/O hiatal hernia   . GERD (gastroesophageal reflux disease)   . Ocular migraine   . TIA (transient ischemic attack) 06/27/2012    "this is my first" (06/27/2012)  . Arthritis     "all over" (06/27/2012)  . Rheumatoid factor positive   . Osteoarthritis     Current Outpatient Prescriptions  Medication Sig Dispense Refill  . albuterol (VENTOLIN HFA) 108 (90 BASE) MCG/ACT inhaler INHALE 2 PUFFS INTO THE LUNGS EVERY 6 (SIX) HOURS AS NEEDED.  18 each  5  . CALCIUM PO Take 1 tablet by mouth daily.        . cetirizine (  ZYRTEC) 10 MG tablet Take 1 tablet (10 mg total) by mouth daily.  90 tablet  1  . clopidogrel (PLAVIX) 75 MG tablet Take 1 tablet (75 mg total) by mouth daily with breakfast.  30 tablet  12  . fluticasone (FLONASE) 50 MCG/ACT nasal spray Place 2 sprays into the nose as needed for rhinitis.      . furosemide (LASIX) 20 MG tablet Take 20 mg by mouth 2 (two) times daily.      [name] lisinopril (PRINIVIL,ZESTRIL) 40 MG tablet Take 1 tablet (40 mg total) by mouth daily.  30 tablet  11  . metFORMIN (GLUCOPHAGE-XR) 500 MG 24 hr tablet Take 500 mg by mouth daily with breakfast.      . Misc Natural Products (OSTEO BI-FLEX JOINT SHIELD) TABS Take 1 tablet by mouth daily.      . Multiple Vitamin (MULTIVITAMIN) capsule Take 1 capsule by mouth daily.        . Nebivolol HCl (BYSTOLIC) 20 MG TABS Take 1 tablet (20 mg  total) by mouth as directed. Take 20 mg in the AM and take 10 mg in evening; make sure to space 12 hours apart  45 tablet  11  . Polyethyl Glycol-Propyl Glycol (SYSTANE) 0.4-0.3 % GEL Apply 1 drop to eye daily as needed (dry eyes).      . Potassium 99 MG TABS Take 99 mg by mouth daily.       . pravastatin (PRAVACHOL) 40 MG tablet Take 1 tablet (40 mg total) by mouth every evening.  30 tablet  5  . pregabalin (LYRICA) 100 MG capsule Take 100 mg by mouth at bedtime as needed (for sleep).      . ranitidine (ZANTAC) 300 MG tablet Take 1 tablet (300 mg total) by mouth at bedtime.  30 tablet  6   No current facility-administered medications for this visit.    Allergies:    Allergies  Allergen Reactions  . Cefuroxime Axetil     REACTION: urticaria (hives)  . Oxycodone Nausea And Vomiting  . Seldane [Terfenadine]     REACTION: urticaria (hives)  . Tramadol     REACTION: vomitting    Social History:  The patient  reports that she has quit smoking. Her smoking use included Cigarettes. She smoked 0.00 packs per day. She has never used smokeless tobacco. She reports that she does not drink alcohol or use illicit drugs.   ROS:  Please see the history of present illness.      All other systems reviewed and negative.   PHYSICAL EXAM: VS:  BP 140/80  Pulse 62  Ht 5\' 3"  (1.6 m)  Wt 192 lb 12.8 oz (87.454 kg)  BMI 34.16 kg/m2  SpO2 98% Well nourished, well developed, in no acute distress HEENT: normal Neck: no JVD Cardiac:  normal S1, S2; RRR; no murmur Lungs:  clear to auscultation bilaterally, no wheezing, rhonchi or rales Abd: soft, nontender, no hepatomegaly Ext: no edema Skin: warm and dry Neuro:  CNs 2-12 intact, no focal abnormalities noted  EKG:  Sinus bradycardia, HR 59, no acute changes, PR 226     ASSESSMENT AND PLAN:  1. PVCs:  Symptomatically improved.  Continue current Rx.  2. Chest Pain:  Low risk Myoview.  No further cardiac workup.  3. Hypertension:  Controlled.   Continue current therapy.  4. Hyperlipidemia:  Continue statin. 5. Hx of TIA:  She remains on Plavix and has f/u with neurology.   6. Disposition: Follow up with Dr.  Tobias Alexander or me in 1 year.  Signed, Tereso Newcomer, PA-C  10/17/2012 9:28 AM

## 2012-10-17 NOTE — Patient Instructions (Signed)
Schedule follow up with Dr. Tobias Alexander (she replaced Dr. Valera Castle) or Tereso Newcomer, PA-C in 1 year.

## 2012-10-31 ENCOUNTER — Other Ambulatory Visit: Payer: Self-pay | Admitting: Internal Medicine

## 2012-10-31 NOTE — Telephone Encounter (Signed)
rx refilled per protocol. DJR  

## 2012-11-27 ENCOUNTER — Encounter (INDEPENDENT_AMBULATORY_CARE_PROVIDER_SITE_OTHER): Payer: Self-pay

## 2012-11-27 ENCOUNTER — Ambulatory Visit (INDEPENDENT_AMBULATORY_CARE_PROVIDER_SITE_OTHER): Payer: Medicare Other | Admitting: Neurology

## 2012-11-27 ENCOUNTER — Encounter: Payer: Self-pay | Admitting: Neurology

## 2012-11-27 VITALS — BP 141/84 | HR 59 | Ht 62.0 in | Wt 190.0 lb

## 2012-11-27 DIAGNOSIS — M25519 Pain in unspecified shoulder: Secondary | ICD-10-CM | POA: Diagnosis not present

## 2012-11-27 DIAGNOSIS — E119 Type 2 diabetes mellitus without complications: Secondary | ICD-10-CM

## 2012-11-27 DIAGNOSIS — I10 Essential (primary) hypertension: Secondary | ICD-10-CM | POA: Diagnosis not present

## 2012-11-27 DIAGNOSIS — E785 Hyperlipidemia, unspecified: Secondary | ICD-10-CM

## 2012-11-27 MED ORDER — DULOXETINE HCL 60 MG PO CPEP: 60.0000 mg | ORAL_CAPSULE | Freq: Every day | ORAL | Status: DC

## 2012-11-27 NOTE — Progress Notes (Signed)
[facility]  PATIENT: Samantha [name] DOB: 1940-10-09   HISTORY FROM: patient, chart REASON FOR VISIT: hospital followup   HISTORICAL  Samantha [name] is an 72 y.o. Right-handed AA female who woke up on the morning of 05/272014 with severe cramp-like pain in her right groin area and noted pain in the interior thigh. This eventually resolved but then she started to have " a crawling " sensation on the top of her head. This started in the right occipital region then spread to the right parietal and frontal region. When it stopped she had a headache in the right frontal aspect of the head. This lasted for two days.   On the morning of Jun 26 2012 she woke up and noted she could not move her right arm. She rubbed her arm for about 5 minutes and then had full sensation and strength all but returned.  She called her primary physician which urged her to go to the ER with concern for stroke.  She did not go to the ER and till the next day on May 31 because she was concerned she did not have anyone to stay with her husband who needs total care.  Upon presentation in the ER, she complained of right shoulder pain causing weakness in her right arm. She was admitted on May 30 and was discharged on May 31 with the diagnosis of TIA.  MRI brain showed small vessel disease, but there was no evidence of an acute stroke. She was on aspirin 81 mg prior to admission and was discharged on Plavix 75 mg daily.  She has history of ocular migraines, which are in the right visual field. They start as a dark spot and then progress to larger dark areas. She never has a HA with these. She has never seen a neurologist. She does not take abortive medication for her HA.   She also presented to hospital with chest pain and palpitations. After several hours observation in the emergency room she was released; cardiac workup was negative.  Heart rate in the emergency room was 58 and 62 on 2 EKGs.  She  continues to have right-sided weakness, in both the right arm and right leg with associated numbness of the right face right arm and right leg.   She has been going to outpatient physical therapy and states she has one more visit.   She continues with Plavix 75 mg and denies bleeding but has had minimal bruising.  Carotid Doppler studies in the hospital were negative 2-D echo was normal.  UPDATE 11/27/2012: Today she complains of 2 week history of bilateral shoulder deep achy pain, difficulty raising her arms over her shoulders, she denies gait difficulty, denies significant weakness, no sensory loss.  She has history of chronic neck pain, had previous cervical decompression surgery in the past   REVIEW OF SYSTEMS: Full 14 system review of systems performed and notable only for: Bilateral shoulder pain, mild unsteady gait    ALLERGIES: Allergies  Allergen Reactions  . Cefuroxime Axetil     REACTION: urticaria (hives)  . Oxycodone Nausea And Vomiting  . Seldane [Terfenadine]     REACTION: urticaria (hives)  . Tramadol     REACTION: vomitting    HOME MEDICATIONS: Outpatient Prescriptions Prior to Visit  Medication Sig Dispense Refill  . albuterol (VENTOLIN HFA) 108 (90 BASE) MCG/ACT inhaler INHALE 2 PUFFS INTO THE LUNGS EVERY 6 (SIX) HOURS AS NEEDED.  18 each  5  . CALCIUM  PO Take 1 tablet by mouth daily.        . cetirizine (ZYRTEC) 10 MG tablet Take 1 tablet (10 mg total) by mouth daily.  90 tablet  1  . clopidogrel (PLAVIX) 75 MG tablet Take 1 tablet (75 mg total) by mouth daily with breakfast.  30 tablet  12  . fluticasone (FLONASE) 50 MCG/ACT nasal spray Place 2 sprays into the nose as needed for rhinitis.      . furosemide (LASIX) 20 MG tablet Take 20 mg by mouth 2 (two) times daily.      [name] lisinopril (PRINIVIL,ZESTRIL) 40 MG tablet Take 1 tablet (40 mg total) by mouth daily.  30 tablet  11  . LYRICA 100 MG capsule TAKE ONE CAPSULE BY MOUTH AT BEDTIME AS NEEDED  30 capsule  1    . metFORMIN (GLUCOPHAGE-XR) 500 MG 24 hr tablet Take 500 mg by mouth daily with breakfast.      . Misc Natural Products (OSTEO BI-FLEX JOINT SHIELD) TABS Take 1 tablet by mouth daily.      . Multiple Vitamin (MULTIVITAMIN) capsule Take 1 capsule by mouth daily.        . Nebivolol HCl (BYSTOLIC) 20 MG TABS Take 1 tablet (20 mg total) by mouth as directed. Take 20 mg in the AM and take 10 mg in evening; make sure to space 12 hours apart  45 tablet  11  . Polyethyl Glycol-Propyl Glycol (SYSTANE) 0.4-0.3 % GEL Apply 1 drop to eye daily as needed (dry eyes).      . Potassium 99 MG TABS Take 99 mg by mouth daily.       . pravastatin (PRAVACHOL) 40 MG tablet Take 1 tablet (40 mg total) by mouth every evening.  30 tablet  5  . ranitidine (ZANTAC) 300 MG tablet Take 1 tablet (300 mg total) by mouth at bedtime.  30 tablet  6   No facility-administered medications prior to visit.    PAST MEDICAL HISTORY: Past Medical History  Diagnosis Date  . OSA (obstructive sleep apnea) 09/2007    dx w/ a sleep study, Rx CPAP, weight loss  . Hyperlipidemia   . Hypertension   . Chest pain, atypical 11/2006    saw cards: neg/normal Holter, ECHo and myoview, admitted w/ CP 4/09 neg enz.and CT chest had a 9 beat NSVT  . Fibromyalgia   . Abnormal CT of the chest 2008    follow up CT, April 2009: unchanged. No f/u suggested   . 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 Shingles 11/2009  . Reactive airway disease   . Vaginal dysplasia   . Vaginal cancer 1994  . Type II diabetes mellitus   . Heart murmur   . CHF (congestive heart failure)   . Pneumonia 2003    "double" (06/27/2012)  . Pneumonia 1990's    "once in one lung" (06/27/2012)  . History of chronic bronchitis   . Shortness of breath     "can happen at any time" (06/27/2012)  . Asthma   . H/O hiatal hernia   . GERD (gastroesophageal reflux disease)   . Ocular migraine   . TIA (transient ischemic attack) 06/27/2012    "this is my first"  (06/27/2012)  . Arthritis     "all over" (06/27/2012)  . Rheumatoid factor positive   . Osteoarthritis     PAST SURGICAL HISTORY: Past Surgical History  Procedure Laterality Date  . Thyroidectomy, partial  1960's  .  Bunionectomy Left ~ 1977  . Breast biopsy Right 1999  . Anterior cervical decomp/discectomy fusion  2001  . Cataract extraction w/ intraocular lens  implant, bilateral  2012  . Vaginal mass excision  1994    "Laser surgery for vaginal cancer; followed by chemotherapy" (06/27/2012)  . Abdominal hysterectomy  1980    FAMILY HISTORY: Family History  Problem Relation Age of Onset  . Allergies Sister   . Asthma Sister   . Asthma Paternal Grandmother   . Heart disease Father   . Heart disease Mother   . Lung cancer Mother   . Heart disease      paternal grandparents, maternal grandparents,   . Heart disease Brother   . Breast cancer Neg Hx   . Colon cancer Neg Hx     SOCIAL HISTORY: History   Social History  . Marital Status: Married    Spouse Name: Adela Glimpse    Number of Children: 2  . Years of Education: masters   Occupational History  . Retired    Social History Main Topics  . Smoking status: Former Smoker    Types: Cigarettes  . Smokeless tobacco: Never Used     Comment: Quit in 2001  . Alcohol Use: No  . Drug Use: No  . Sexual Activity: No   Other Topics Concern  . Not on file   Social History Narrative   On disability since 2000--- also husband has MS   Caffeine- half cup daily coffee.   Education. College.   Right handed.     PHYSICAL EXAM  Filed Vitals:   11/27/12 1328  BP: 141/84  Pulse: 59  Height: 5\' 2"  (1.575 m)  Weight: 190 lb (86.183 kg)   Body mass index is 34.74 kg/(m^2).  PHYSICAL EXAMINATOINS:  Generalized: In no acute distress  Neck: Supple, no carotid bruits   Cardiac: Regular rate rhythm  Pulmonary: Clear to auscultation bilaterally  Musculoskeletal: No deformity  Neurological examination  Mentation:  Alert oriented to time, place, history taking, and causual conversation  Cranial nerve II-XII: Pupils were equal round reactive to light extraocular movements were full, visual field were full on confrontational test. facial sensation and strength were normal. hearing was intact to finger rubbing bilaterally. Uvula tongue midline.  head turning and shoulder shrug and were normal and symmetric.Tongue protrusion into cheek strength was normal.  Motor: normal tone, bulk and strength, she has bilateral shoulder muscle deep achy pain upon deep palpitations.  Sensory: Intact to fine touch, pinprick, preserved vibratory sensation, and proprioception at toes.  Coordination: Normal finger to nose, heel-to-shin bilaterally there was no truncal ataxia  Gait: Rising up from seated position without assistance, normal stance, without trunk ataxia, moderate stride, good arm swing, smooth turning, able to perform tiptoe, and heel walking without difficulty.   Romberg signs: Negative  Deep tendon reflexes: Brachioradialis 2/2, biceps 2/2, triceps 2/2, patellar 2/2, Achilles 2/2, plantar responses were flexor bilaterally.   DIAGNOSTIC DATA (LABS, IMAGING, TESTING) - I reviewed patient records, labs, notes, testing and imaging myself where available.  Lab Results  Component Value Date   WBC 3.2* 08/22/2012   HGB 12.5 08/22/2012   HCT 35.7* 08/22/2012   MCV 81.1 08/22/2012   PLT 128* 08/22/2012      Component Value Date/Time   NA 139 08/22/2012 0056   K 3.8 08/22/2012 0056   CL 104 08/22/2012 0056   CO2 28 07/24/2012 1734   GLUCOSE 94 08/22/2012 0056   BUN 19 08/22/2012 0056  CREATININE 0.79 08/22/2012 0900   CREATININE 1.11* 08/29/2011 1640   CALCIUM 9.3 07/24/2012 1734   PROT 7.6 06/27/2012 0325   ALBUMIN 4.1 06/27/2012 0325   AST 22 06/27/2012 0325   ALT 15 06/27/2012 0325   ALKPHOS 66 06/27/2012 0325   BILITOT 0.4 06/27/2012 0325   GFRNONAA 82* 08/22/2012 0900   GFRAA >90 08/22/2012 0900   Lab Results    Component Value Date   CHOL 148 08/22/2012   HDL 73 08/22/2012   LDLCALC 52 08/22/2012   LDLDIRECT 126.2 12/07/2010   TRIG 116 08/22/2012   CHOLHDL 2.0 08/22/2012   Lab Results  Component Value Date   HGBA1C 6.2* 08/22/2012   No results found for this basename: [id number]   Lab Results  Component Value Date   TSH 2.26 08/20/2012   CAROTID DOPPLER 06/27/12 - Findings consistent with less than 39 percent stenosis involving the right internal carotid artery and the left internal carotid artery. - Bilateral vertebral artery flow is antegrade.  MRI HEAD WITHOUT CONTRAST 06/27/12 1. Scattered periventricular subcortical T2 hyperintensities are slightly greater than expected for age. The finding is nonspecific but can be seen in the setting of chronic microvascular ischemia, a demyelinating process such as multiple sclerosis, vasculitis, complicated migraine headaches, or as the sequelae of a prior infectious or inflammatory process.  2. No acute or focal intracranial abnormality to explain the patient's symptoms.  3. Minimal sinus disease.  4. Postoperative changes of the upper cervical spine.   MRA HEAD WITHOUT CONTRAST 06/27/12 1. Mild small vessel disease.  2. No significant proximal stenosis, aneurysm, or branch vessel  occlusion.  ASSESSMENT AND PLAN  Samantha [name] is an 72 y.o. right-handed AA female with vascular risk factors of diabetes, hypertension, hyperlipidemia, and obesity with possible TIA on Jun 27, 2012.  MRI showed mild small vessel disease.  She was on Aspirin 81 mg prior to admission and is now on Plavix 75mg  daily. She now complains of bilateral shoulder muscle deep achy pain, most consistent with musculoskeletal etiology   PLAN: Continue clopidogrel 75 mg orally every day  for secondary stroke prevention Add on  Cymbalta 60 mg a day Refer her to physical therapy    Levert Feinstein, M.D.  [facility] [address], [address] [place], [place]  [address] [phone]

## 2012-12-02 ENCOUNTER — Telehealth: Payer: Self-pay | Admitting: *Deleted

## 2012-12-02 ENCOUNTER — Ambulatory Visit: Payer: Medicare Other | Admitting: Internal Medicine

## 2012-12-02 MED ORDER — METFORMIN HCL ER 500 MG PO TB24: 500.0000 mg | ORAL_TABLET | Freq: Every day | ORAL | Status: DC

## 2012-12-02 NOTE — Telephone Encounter (Signed)
rx refilled per protocol till her next apt. DJR

## 2012-12-02 NOTE — Telephone Encounter (Signed)
12/02/2012  Pt came in for her appt this morning, but had time confused and had to reschedule.  Her new appt is 12/23/2012.  She will need her metFORMIN (GLUCOPHAGE-XR) 500 MG 24 hr tablet refilled before her appt on 12/23/12.  Patient states she will run out sometime next week.  Please send prescription to [facility] on [address] at [employer].  Thank you.  bw

## 2012-12-23 ENCOUNTER — Encounter: Payer: Self-pay | Admitting: Internal Medicine

## 2012-12-23 ENCOUNTER — Ambulatory Visit (INDEPENDENT_AMBULATORY_CARE_PROVIDER_SITE_OTHER): Payer: Medicare Other | Admitting: Internal Medicine

## 2012-12-23 ENCOUNTER — Telehealth: Payer: Self-pay | Admitting: *Deleted

## 2012-12-23 VITALS — BP 160/86 | HR 70 | Temp 97.4°F | Wt 189.0 lb

## 2012-12-23 DIAGNOSIS — Z23 Encounter for immunization: Secondary | ICD-10-CM | POA: Diagnosis not present

## 2012-12-23 DIAGNOSIS — G459 Transient cerebral ischemic attack, unspecified: Secondary | ICD-10-CM | POA: Diagnosis not present

## 2012-12-23 DIAGNOSIS — I10 Essential (primary) hypertension: Secondary | ICD-10-CM | POA: Diagnosis not present

## 2012-12-23 DIAGNOSIS — R002 Palpitations: Secondary | ICD-10-CM

## 2012-12-23 DIAGNOSIS — B0229 Other postherpetic nervous system involvement: Secondary | ICD-10-CM

## 2012-12-23 DIAGNOSIS — E119 Type 2 diabetes mellitus without complications: Secondary | ICD-10-CM

## 2012-12-23 LAB — BASIC METABOLIC PANEL
Calcium: 9.1 mg/dL (ref 8.4–10.5)
GFR: 72.03 mL/min (ref >60.00)
Glucose, Bld: 96 mg/dL (ref 70–99)
Sodium: 138 mEq/L (ref 135–145)

## 2012-12-23 MED ORDER — CETIRIZINE HCL 10 MG PO TABS: 10.0000 mg | ORAL_TABLET | Freq: Every day | ORAL | Status: DC

## 2012-12-23 MED ORDER — METFORMIN HCL ER 500 MG PO TB24: 500.0000 mg | ORAL_TABLET | Freq: Every day | ORAL | Status: DC

## 2012-12-23 MED ORDER — RANITIDINE HCL 300 MG PO TABS: 300.0000 mg | ORAL_TABLET | Freq: Every day | ORAL | Status: DC

## 2012-12-23 MED ORDER — PREGABALIN 100 MG PO CAPS: ORAL_CAPSULE | ORAL | Status: DC

## 2012-12-23 MED ORDER — ZOSTER VACCINE LIVE 19400 UNT/0.65ML ~~LOC~~ SOLR: 0.6500 mL | Freq: Once | SUBCUTANEOUS | Status: DC

## 2012-12-23 MED ORDER — LISINOPRIL 40 MG PO TABS: 40.0000 mg | ORAL_TABLET | Freq: Every day | ORAL | Status: DC

## 2012-12-23 NOTE — Telephone Encounter (Signed)
Pt requesting rx for shingles vaccine to be faxed [facility].  Please advise. DJR

## 2012-12-23 NOTE — Assessment & Plan Note (Signed)
Slightly elevated today, normal BPs at other offices. Ambulatory BPs range from 130 to 150. Apparently she has been taking bystolic a total of  60 mg daily, plan: Decreased byastolic to 20 mg twice a day Encourage compliance with Lasix (sometimes she skips) Continue monitoring BPs

## 2012-12-23 NOTE — Assessment & Plan Note (Signed)
holter  negative, patient now asymptomatic

## 2012-12-23 NOTE — Patient Instructions (Signed)
Get your blood work before you leave  Next visit in 4 months  for a   diabetes hypertension  follow up (30 minutes) No Fasting Please make an appointment    Check the  blood pressure 2   times a   week be sure it is between 110/60 and 140/85. Ideal blood pressure is 120/80. If it is consistently higher or lower, let me know

## 2012-12-23 NOTE — Progress Notes (Signed)
Pre visit review using our clinic review tool, if applicable. No additional management support is needed unless otherwise documented below in the visit note. 

## 2012-12-23 NOTE — Progress Notes (Signed)
  Subjective:    Patient ID: Samantha [name], female    DOB: 1940-09-07, 72 y.o.   MRN: [id number]  HPI Routine office visit History of TIA, no further dizziness, recently saw neurology, note reviewed. They added Cymbalta, see assessment and plan Palpitations--now asymptomatic, Holter was negative. Was seen by cardiology, felt to be stable, followup with cardiology one year. Hypertension--BP slightly elevated today, apparently taking bystolic 20 mg 2 tablets in the morning and one in the afternoon which exceeds the normal dose. Ambulatory BPs range from 130-150.    Past Medical History  Diagnosis Date  . OSA (obstructive sleep apnea) 09/2007    dx w/ a sleep study, Rx CPAP, weight loss  . Hyperlipidemia   . Hypertension   . Chest pain, atypical 11/2006    saw cards: neg/normal Holter, ECHo and myoview, admitted w/ CP 4/09 neg enz.and CT chest had a 9 beat NSVT  . Fibromyalgia   . Abnormal CT of the chest 2008    follow up CT, April 2009: unchanged. No f/u suggested   . 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 Shingles 11/2009  . Reactive airway disease   . Vaginal dysplasia   . Vaginal cancer 1994  . Type II diabetes mellitus   . Heart murmur   . CHF (congestive heart failure)   . Pneumonia 2003    "double" (06/27/2012)  . Pneumonia 1990's    "once in one lung" (06/27/2012)  . History of chronic bronchitis   . Shortness of breath     "can happen at any time" (06/27/2012)  . Asthma   . H/O hiatal hernia   . GERD (gastroesophageal reflux disease)   . Ocular migraine   . TIA (transient ischemic attack) 06/27/2012    "this is my first" (06/27/2012)  . Arthritis     "all over" (06/27/2012)  . Rheumatoid factor positive   . Osteoarthritis    Past Surgical History  Procedure Laterality Date  . Thyroidectomy, partial  1960's  . Bunionectomy Left ~ 1977  . Breast biopsy Right 1999  . Anterior cervical decomp/discectomy fusion  2001  . Cataract extraction w/  intraocular lens  implant, bilateral  2012  . Vaginal mass excision  1994    "Laser surgery for vaginal cancer; followed by chemotherapy" (06/27/2012)  . Abdominal hysterectomy  1980      Review of Systems  Denies chest pain or shortness or breath No symptoms consistent with low blood sugars Continue with right shoulder pain, unable to elevate the right arm or reach. In the past a local ejection help. + stress, over some family issues, she thinks that's the reason why her BP is elevated today, pt counseled    Objective:   Physical Exam BP 160/86  Pulse 70  Temp(Src) 97.4 F (36.3 C)  Wt 189 lb (85.73 kg)  SpO2 97% General -- alert, well-developed, NAD.  Lungs -- normal respiratory effort, no intercostal retractions, no accessory muscle use, and normal breath sounds.  Heart-- normal rate, regular rhythm, no murmur.  Extremities-- no pretibial edema bilaterally  Neurologic--  alert & oriented X3. Speech normal, gait normal, strength normal in all extremities.  Psych-- Cognition and judgment appear intact. Cooperative with normal attention span and concentration. No anxious appearing , no depressed appearing.     Assessment & Plan:

## 2012-12-23 NOTE — Telephone Encounter (Signed)
Done. DJR  

## 2012-12-23 NOTE — Assessment & Plan Note (Signed)
Good compliance with medications, labs 

## 2012-12-23 NOTE — Assessment & Plan Note (Signed)
No further symptoms, continue Plavix and controlling cardiovascular risk factors

## 2012-12-23 NOTE — Assessment & Plan Note (Addendum)
The patient reports a burning feeling at the left flank where  she had shingles before, Cymbalta was prescribed by neurology at the last office visit (10-2012) for treatment of postherpetic neuralgia (per pt)  although the office visit notes are not clear on that.

## 2012-12-23 NOTE — Telephone Encounter (Signed)
Please do

## 2012-12-23 NOTE — Assessment & Plan Note (Signed)
Continue c/o of right shoulder pain, recommend to see orthopedic surgery, she is planning to call Dr. Kathie Rhodes Norrins and set up an appointment, in the past a local injection helped

## 2012-12-24 DIAGNOSIS — Z23 Encounter for immunization: Secondary | ICD-10-CM | POA: Diagnosis not present

## 2012-12-24 DIAGNOSIS — M25519 Pain in unspecified shoulder: Secondary | ICD-10-CM | POA: Diagnosis not present

## 2012-12-26 ENCOUNTER — Encounter: Payer: Self-pay | Admitting: *Deleted

## 2013-01-07 DIAGNOSIS — N390 Urinary tract infection, site not specified: Secondary | ICD-10-CM | POA: Diagnosis not present

## 2013-01-07 DIAGNOSIS — Z124 Encounter for screening for malignant neoplasm of cervix: Secondary | ICD-10-CM | POA: Diagnosis not present

## 2013-01-07 DIAGNOSIS — Z9189 Other specified personal risk factors, not elsewhere classified: Secondary | ICD-10-CM | POA: Diagnosis not present

## 2013-01-15 DIAGNOSIS — Z1231 Encounter for screening mammogram for malignant neoplasm of breast: Secondary | ICD-10-CM | POA: Diagnosis not present

## 2013-01-23 ENCOUNTER — Encounter: Payer: Self-pay | Admitting: Internal Medicine

## 2013-01-23 ENCOUNTER — Ambulatory Visit (INDEPENDENT_AMBULATORY_CARE_PROVIDER_SITE_OTHER): Payer: Medicare Other | Admitting: Internal Medicine

## 2013-01-23 VITALS — BP 130/80 | HR 67 | Temp 98.6°F | Resp 16 | Wt 186.0 lb

## 2013-01-23 DIAGNOSIS — M797 Fibromyalgia: Secondary | ICD-10-CM

## 2013-01-23 DIAGNOSIS — R0789 Other chest pain: Secondary | ICD-10-CM | POA: Diagnosis not present

## 2013-01-23 DIAGNOSIS — IMO0001 Reserved for inherently not codable concepts without codable children: Secondary | ICD-10-CM | POA: Diagnosis not present

## 2013-01-23 DIAGNOSIS — R61 Generalized hyperhidrosis: Secondary | ICD-10-CM | POA: Diagnosis not present

## 2013-01-23 LAB — CBC WITH DIFFERENTIAL/PLATELET
Basophils Absolute: 0 10*3/uL (ref 0.0–0.1)
Eosinophils Absolute: 0.1 10*3/uL (ref 0.0–0.7)
HCT: 40.4 % (ref 36.0–46.0)
Lymphs Abs: 1.6 10*3/uL (ref 0.7–4.0)
MCHC: 33.6 g/dL (ref 30.0–36.0)
MCV: 84.9 fl (ref 78.0–100.0)
Monocytes Absolute: 0.3 10*3/uL (ref 0.1–1.0)
Platelets: 135 10*3/uL — ABNORMAL LOW (ref 150.0–400.0)
RDW: 14.5 % (ref 11.5–14.6)

## 2013-01-23 LAB — CK TOTAL AND CKMB (NOT AT ARMC)
CK, MB: 4 ng/mL (ref 0.0–5.0)
Relative Index: 1.1 (ref 0.0–4.0)

## 2013-01-23 LAB — BASIC METABOLIC PANEL
BUN: 28 mg/dL — ABNORMAL HIGH (ref 6–23)
CO2: 27 mEq/L (ref 19–32)
Chloride: 104 mEq/L (ref 96–112)
Glucose, Bld: 104 mg/dL — ABNORMAL HIGH (ref 70–99)
Potassium: 4.6 mEq/L (ref 3.5–5.1)

## 2013-01-23 LAB — TROPONIN I: Troponin I: <0.01 ng/mL (ref <0.06)

## 2013-01-23 LAB — D-DIMER, QUANTITATIVE (NOT AT ARMC): D-Dimer, Quant: 0.64 ug/mL-FEU — ABNORMAL HIGH (ref 0.00–0.48)

## 2013-01-23 NOTE — Progress Notes (Signed)
Subjective:    Patient ID: Samantha [name], female    DOB: 1940-07-09, 72 y.o.   MRN: [id number]  HPI  She has had significant sweating which she describes as profuse for 8 days. This is also associated with some muscle cramps in the upper and lower extremities. Initially she thought that the sweating was related to urinary tract infection for which she's been treated by her Gyn with sulfa.  She describes muscle pain in her legs, arms, chest, and neck. The pain is worse in the right arm than the left.She also states that she's had pain in the right axilla for 6 months.  She's also had some headache and near syncope.  The sweating is described as severe enough that it is "soaking".   In 2012 she was taken off simvastatin for muscle injury;but she has been on pravastatin. At the time of the muscle issues she was having profuse sweating as well  This morning she had sharp pain across anterior chest up to a level X. It was nonradiating.  She's also had some hoarseness. She describes some rattling in her chest.  She's taken Tylenol which has not helped her symptoms.  PMH of fibromyalgia; she is on Lyrica & Cymbalta.    Review of Systems  As the review of systems was conducted she added more positive symptoms. It was only after I initiated the review of systems she mentioned the chest pain, headaches, and near syncope. She describes left upper quadrant abdominal pain. She is not having melena or rectal bleeding.  She denies dysuria, pyuria, or hematuria.  She describes rhinitis.  She denies frontal headache, facial pain, or nasal purulence.  She also denies cough or sputum production.  No fever associated with the sweating.        Objective:   Physical Exam  Gen.: Healthy and well-nourished in appearance. Alert, appropriate and cooperative throughout exam.Appears much younger than stated age  Head: Normocephalic without obvious abnormalities Eyes: No corneal or  conjunctival inflammation noted. Pupils equal round reactive to light and accommodation. Extraocular motion intact. Arcus present. Ears: External  ear exam reveals no significant lesions or deformities. Canals clear .TMs normal.  Nose: External nasal exam reveals no deformity or inflammation. Nasal mucosa are pink and moist. No lesions or exudates noted.  Mouth: Oral mucosa and oropharynx reveal no lesions or exudates. Teeth in good repair. Neck: No deformities, masses, or tenderness noted. Thyroid not palpable. Lungs: Normal respiratory effort; chest expands symmetrically. Lungs are clear to auscultation without rales, wheezes, or increased work of breathing. Heart: Normal rate and rhythm. Normal S1 and S2. No gallop, click, or rub. S4 w/o murmur. Abdomen: Bowel sounds normal; abdomen soft and nontender. No masses, organomegaly or hernias noted. Musculoskeletal/extremities: No clubbing, cyanosis, edema, or significant extremity  deformity noted. Tone & strength normal. Hand joints  reveal mild  DJD DIP changes. Fingernail  health good. Able to lie down & sit up w/o help. Negative SLR bilaterally. Equivocal Homan's bilaterally Vascular: Carotid, radial artery, dorsalis pedis and  posterior tibial pulses are full and equal. No bruits present. Neurologic: Alert and oriented x3. Deep tendon reflexes symmetrical and normal.       Skin: Intact without suspicious lesions or rashes. Lymph: No cervical, axillary lymphadenopathy present. Psych: Mood and affect are normal. Normally interactive . Strongly positive ROS ,but la belle infifference  Suggested.  Assessment & Plan:  #1 sweating; PMH of same with possible rhabdomyolysis. Doubt Carcinoid as no associated diarrhea  #2 diffuse muscle pain on statins with past history of statin intolerance and possible rhabdomyolysis  #3 chest pain. Compared to 10/20/12 T voltage  appears improved in the lateral leads. No ischemic changes present. Nonspecific ST-T changes V 2-3.  #4 fibromyalgia  See orders.

## 2013-01-23 NOTE — Patient Instructions (Signed)
Your next office appointment will be determined based upon review of your pending labs . Those instructions will be transmitted to you through My Chart  or by mail if you're not using this system.  Followup as needed for your this acute issue. Please report any significant change in your symptoms. Hold the Pravastatin until labs are final.

## 2013-01-24 ENCOUNTER — Encounter: Payer: Self-pay | Admitting: Internal Medicine

## 2013-01-26 ENCOUNTER — Encounter: Payer: Self-pay | Admitting: *Deleted

## 2013-02-09 ENCOUNTER — Telehealth: Payer: Self-pay | Admitting: Internal Medicine

## 2013-02-09 NOTE — Telephone Encounter (Signed)
Pt states that she has not received the lab results that were supposed to be mailed to her. She asks that someone call her with the results. Please advise.

## 2013-02-11 NOTE — Telephone Encounter (Signed)
Spoke with the pt and informed her that she will need to schedule an appt with Dr. Larose Kells to discuss what meds she will need to be on for her cholesterol.  Pt agreed and a appt was made.//AB/CMA

## 2013-02-12 ENCOUNTER — Ambulatory Visit (INDEPENDENT_AMBULATORY_CARE_PROVIDER_SITE_OTHER): Payer: Medicare Other | Admitting: Internal Medicine

## 2013-02-12 ENCOUNTER — Encounter: Payer: Self-pay | Admitting: Internal Medicine

## 2013-02-12 VITALS — BP 119/75 | HR 64 | Temp 98.2°F | Wt 190.0 lb

## 2013-02-12 DIAGNOSIS — IMO0001 Reserved for inherently not codable concepts without codable children: Secondary | ICD-10-CM

## 2013-02-12 DIAGNOSIS — M25519 Pain in unspecified shoulder: Secondary | ICD-10-CM | POA: Diagnosis not present

## 2013-02-12 DIAGNOSIS — E785 Hyperlipidemia, unspecified: Secondary | ICD-10-CM | POA: Diagnosis not present

## 2013-02-12 DIAGNOSIS — B0229 Other postherpetic nervous system involvement: Secondary | ICD-10-CM

## 2013-02-12 LAB — CREATININE KINASE MB: CK-MB: 4.7 ng/mL — ABNORMAL HIGH (ref 0.3–4.0)

## 2013-02-12 NOTE — Assessment & Plan Note (Signed)
Had a local injection, improving

## 2013-02-12 NOTE — Assessment & Plan Note (Signed)
Currently on Cymbalta and Lyrica, still has some burning.

## 2013-02-12 NOTE — Progress Notes (Signed)
   Subjective:    Patient ID: Samantha [name], female    DOB: [birth year], 73 y.o.   MRN: [id number]  HPI Acute visit,   recently saw him one of my partners w/ severe sweats, generalized aches in the setting of being treated for a UTI with sulfa drugs. She also had chest pain. At this point, the sweats have decreased, she is holding Pravachol and aches are still there but not as noticeable except in the right arm where she has "pain all over the arm". She denies any significant neck pain. Chest pain is gone.  Labs reviewed with the patient, the d-dimer 0.6 which was felt no to be significant . CK was around 360  Past Medical History  Diagnosis Date  . OSA (obstructive sleep apnea) 09/2007    dx w/ a sleep study, Rx CPAP, weight loss  . Hyperlipidemia   . Hypertension   . Chest pain, atypical 11/2006    saw cards: neg/normal Holter, ECHo and myoview, admitted w/ CP 4/09 neg enz.and CT chest had a 9 beat NSVT  . Fibromyalgia   . Abnormal CT of the chest 2008    follow up CT, April 2009: unchanged. No f/u suggested   . 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 Shingles 11/2009  . Reactive airway disease   . Vaginal dysplasia   . Vaginal cancer 1994  . Type II diabetes mellitus   . Heart murmur   . CHF (congestive heart failure)   . Pneumonia 2003    "double" (06/27/2012)  . Pneumonia 1990's    "once in one lung" (06/27/2012)  . History of chronic bronchitis   . Shortness of breath     "can happen at any time" (06/27/2012)  . Asthma   . H/O hiatal hernia   . GERD (gastroesophageal reflux disease)   . Ocular migraine   . TIA (transient ischemic attack) 06/27/2012    "this is my first" (06/27/2012)  . Arthritis     "all over" (06/27/2012)  . Rheumatoid factor positive   . Osteoarthritis    Past Surgical History  Procedure Laterality Date  . Thyroidectomy, partial  1960's  . Bunionectomy Left ~ 1977  . Breast biopsy Right 1999  . Anterior cervical  decomp/discectomy fusion  2001  . Cataract extraction w/ intraocular lens  implant, bilateral  2012  . Vaginal mass excision  1994    "Laser surgery for vaginal cancer; followed by chemotherapy" (06/27/2012)  . Abdominal hysterectomy  1980     Review of Systems Denies fever or chills No difficulty breathing No dysuria gross hematuria    Objective:   Physical Exam BP 119/75  Pulse 64  Temp(Src) 98.2 F (36.8 C)  Wt 190 lb (86.183 kg)  SpO2 93% General -- alert, well-developed, NAD.   Lungs -- normal respiratory effort, no intercostal retractions, no accessory muscle use, and normal breath sounds.  Heart-- normal rate, regular rhythm, no murmur.   Extremities-- no pretibial edema bilaterally ; Inspection on palpation of the upper extremities is normal except for tenderness in the right arm, no redness or warmness. There is no evidence of synovitis at the wrists or hands. Neurologic--  alert & oriented X3. Speech normal, gait normal, strength normal in all extremities.  Psych-- Cognition and judgment appear intact. Cooperative with normal attention span and concentration. slt  anxious but no depressed appearing.     Assessment & Plan:

## 2013-02-12 NOTE — Assessment & Plan Note (Addendum)
She has generalized aches and pains worse on the right arm; Symptoms slightly decreased after she discontinued Pravachol. Again the pain is worse in the right arm, strength in the extremities is symmetric, she denies neck pain.  Recent CK slightly elevated (not a new finding), sed rate normal. I believe most of her pain is related to fibromyalgia, recommend to see Dr. Charlestine Night. Recheck a CK

## 2013-02-12 NOTE — Assessment & Plan Note (Signed)
Recently saw Dr. Linna Darner w/ generalized aches, she is holding Pravachol and the pains are slightly better except in the right arm. CK was slightly elevated which is not a new finding.  Plan: avoid statins for now, recheck on return to the office

## 2013-02-12 NOTE — Progress Notes (Signed)
Pre visit review using our clinic review tool, if applicable. No additional management support is needed unless otherwise documented below in the visit note. 

## 2013-02-12 NOTE — Patient Instructions (Addendum)
Get your blood work before you leave    Next visit is for a physical exam in 3 months , fasting Please make an appointment    See Dr Charlestine Night regards fibromyalgia

## 2013-02-13 ENCOUNTER — Encounter: Payer: Self-pay | Admitting: Internal Medicine

## 2013-02-13 ENCOUNTER — Ambulatory Visit: Payer: Medicare Other

## 2013-02-13 DIAGNOSIS — IMO0001 Reserved for inherently not codable concepts without codable children: Secondary | ICD-10-CM | POA: Diagnosis not present

## 2013-02-14 LAB — CK TOTAL AND CKMB (NOT AT ARMC)
CK, MB: 3.8 ng/mL (ref 0.0–5.0)
RELATIVE INDEX: 1.3 (ref 0.0–4.0)
Total CK: 300 U/L — ABNORMAL HIGH (ref 7–177)

## 2013-03-09 ENCOUNTER — Other Ambulatory Visit: Payer: Self-pay | Admitting: Cardiology

## 2013-03-10 DIAGNOSIS — M67919 Unspecified disorder of synovium and tendon, unspecified shoulder: Secondary | ICD-10-CM | POA: Diagnosis not present

## 2013-03-10 DIAGNOSIS — M719 Bursopathy, unspecified: Secondary | ICD-10-CM | POA: Diagnosis not present

## 2013-03-10 DIAGNOSIS — M25579 Pain in unspecified ankle and joints of unspecified foot: Secondary | ICD-10-CM | POA: Diagnosis not present

## 2013-03-10 DIAGNOSIS — R6889 Other general symptoms and signs: Secondary | ICD-10-CM | POA: Diagnosis not present

## 2013-03-10 DIAGNOSIS — M2550 Pain in unspecified joint: Secondary | ICD-10-CM | POA: Diagnosis not present

## 2013-03-18 ENCOUNTER — Other Ambulatory Visit: Payer: Self-pay | Admitting: Internal Medicine

## 2013-03-31 ENCOUNTER — Ambulatory Visit: Payer: Medicare Other | Admitting: Nurse Practitioner

## 2013-04-02 ENCOUNTER — Encounter: Payer: Self-pay | Admitting: Nurse Practitioner

## 2013-04-02 ENCOUNTER — Ambulatory Visit (INDEPENDENT_AMBULATORY_CARE_PROVIDER_SITE_OTHER): Payer: Medicare Other | Admitting: Nurse Practitioner

## 2013-04-02 VITALS — BP 139/85 | HR 60 | Temp 98.6°F | Ht 63.0 in | Wt 186.5 lb

## 2013-04-02 DIAGNOSIS — F411 Generalized anxiety disorder: Secondary | ICD-10-CM | POA: Diagnosis not present

## 2013-04-02 DIAGNOSIS — G450 Vertebro-basilar artery syndrome: Secondary | ICD-10-CM

## 2013-04-02 DIAGNOSIS — IMO0001 Reserved for inherently not codable concepts without codable children: Secondary | ICD-10-CM | POA: Diagnosis not present

## 2013-04-02 MED ORDER — CLOPIDOGREL BISULFATE 75 MG PO TABS: 75.0000 mg | ORAL_TABLET | Freq: Every day | ORAL | Status: DC

## 2013-04-02 NOTE — Progress Notes (Signed)
PATIENT: Samantha [name] DOB: [birth year]  REASON FOR VISIT: follow up for TIA  HISTORY FROM: patient  HISTORY OF PRESENT ILLNESS: Samantha [name] is an 73 y.o. Right-handed AA female who woke up on the morning of 05/272014 with severe cramp-like pain in her right groin area and noted pain in the interior thigh. This eventually resolved but then she started to have " a crawling " sensation on the top of her head. This started in the right occipital region then spread to the right parietal and frontal region. When it stopped she had a headache in the right frontal aspect of the head. This lasted for two days.  On the morning of Jun 26 2012 she woke up and noted she could not move her right arm. She rubbed her arm for about 5 minutes and then had full sensation and strength all but returned. She called her primary physician which urged her to go to the ER with concern for stroke. She did not go to the ER and till the next day on May 31 because she was concerned she did not have anyone to stay with her husband who needs total care. Upon presentation in the ER, she complained of right shoulder pain causing weakness in her right arm. She was admitted on May 30 and was discharged on May 31 with the diagnosis of TIA. MRI brain showed small vessel disease, but there was no evidence of an acute stroke. She was on aspirin 81 mg prior to admission and was discharged on Plavix 75 mg daily.  She has history of ocular migraines, which are in the right visual field. They start as a dark spot and then progress to larger dark areas. She never has a HA with these. She has never seen a neurologist. She does not take abortive medication for her HA.  She also presented to hospital with chest pain and palpitations. After several hours observation in the emergency room she was released; cardiac workup was negative. Heart rate in the emergency room was 58 and 62 on 2 EKGs. She continues to have right-sided weakness, in  both the right arm and right leg with associated numbness of the right face right arm and right leg.  She has been going to outpatient physical therapy and states she has one more visit. She continues with Plavix 75 mg and denies bleeding but has had minimal bruising.  Carotid Doppler studies in the hospital were negative, 2-D echo was normal.   UPDATE 11/27/2012:  Today she complains of 2 week history of bilateral shoulder deep achy pain, difficulty raising her arms over her shoulders, she denies gait difficulty, denies significant weakness, no sensory loss.  She has history of chronic neck pain, had previous cervical decompression surgery in the past.  UPDATE 04/02/13 (LL): She returns for follow up, has had good response from Duloxtine, much less overall pain.  She has seen Dr. Charlestine Night for 3 injections to her right shoulder which have relieved the shoulder pain, but she is having significant pain in the muscles of her right arm.  No new neurovascular symptoms.  REVIEW OF SYSTEMS: Full 14 system review of systems performed and notable only for: back pain, aching muscles, mild unsteady gait, excessive sweating, wheezing, palpitations, murmur, bruise easily, headache, tremors.  ALLERGIES: Allergies  Allergen Reactions  . Cefuroxime Axetil     REACTION: urticaria (hives)  . Oxycodone Nausea And Vomiting  . Seldane [Terfenadine]     REACTION: urticaria (hives)  .  Zocor [Simvastatin]     2012 "Muscle breakdown " with profuse sweating  . Tramadol     REACTION: vomitting  . Pravastatin     "muscle breakdown" with profuse sweating    HOME MEDICATIONS: Outpatient Prescriptions Prior to Visit  Medication Sig Dispense Refill  . albuterol (VENTOLIN HFA) 108 (90 BASE) MCG/ACT inhaler INHALE 2 PUFFS INTO THE LUNGS EVERY 6 (SIX) HOURS AS NEEDED.  18 each  5  . CALCIUM PO Take 1 tablet by mouth daily.        . cetirizine (ZYRTEC) 10 MG tablet Take 1 tablet (10 mg total) by mouth daily.  90 tablet   1  . clopidogrel (PLAVIX) 75 MG tablet Take 1 tablet (75 mg total) by mouth daily with breakfast.  30 tablet  12  . DULoxetine (CYMBALTA) 60 MG capsule Take 1 capsule (60 mg total) by mouth daily.  30 capsule  12  . fluticasone (FLONASE) 50 MCG/ACT nasal spray Place 2 sprays into the nose as needed for rhinitis.      . furosemide (LASIX) 20 MG tablet Take 20 mg by mouth 2 (two) times daily.      [name] lisinopril (PRINIVIL,ZESTRIL) 40 MG tablet Take 1 tablet (40 mg total) by mouth daily.  30 tablet  6  . metFORMIN (GLUCOPHAGE-XR) 500 MG 24 hr tablet Take 1 tablet (500 mg total) by mouth daily with breakfast.  90 tablet  1  . Misc Natural Products (OSTEO BI-FLEX JOINT SHIELD) TABS Take 1 tablet by mouth daily.      . Multiple Vitamin (MULTIVITAMIN) capsule Take 1 capsule by mouth daily.        . Nebivolol HCl 20 MG TABS Take 20 mg by mouth 2 (two) times daily.      Vladimir Faster Glycol-Propyl Glycol (SYSTANE) 0.4-0.3 % GEL Apply 1 drop to eye daily as needed (dry eyes).      . Potassium 99 MG TABS Take 99 mg by mouth daily.       . pregabalin (LYRICA) 100 MG capsule TAKE ONE CAPSULE BY MOUTH AT BEDTIME AS NEEDED  30 capsule  6  . ranitidine (ZANTAC) 300 MG tablet TAKE 1 TABLET (300 MG TOTAL) BY MOUTH AT BEDTIME.  30 tablet  6  . zoster vaccine live, PF, (ZOSTAVAX) 97673 UNT/0.65ML injection Inject 19,400 Units into the skin once.  1 each  0   No facility-administered medications prior to visit.     PHYSICAL EXAM  Filed Vitals:   04/02/13 0936  BP: 139/85  Pulse: 60  Temp: 98.6 F (37 C)  TempSrc: Oral  Height: 5\' 3"  (1.6 m)  Weight: 186 lb 8 oz (84.596 kg)   Body mass index is 33.05 kg/(m^2).  Generalized: In no acute distress  Neck: Supple, no carotid bruits  Cardiac: Regular rate rhythm  Pulmonary: Clear to auscultation bilaterally  Musculoskeletal: No deformity  Neurological examination  Mentation: Alert oriented to time, place, history taking, and casual conversation  Cranial  nerve II-XII: Pupils were equal round reactive to light extraocular movements were full, visual field were full on confrontational test. facial sensation and strength were normal. hearing was intact to finger rubbing bilaterally. Uvula tongue midline. head turning and shoulder shrug and were normal and symmetric.Tongue protrusion into cheek strength was normal.  Motor: normal tone, bulk and strength, right arm poor effort due to pain. Sensory: Intact to fine touch, pinprick, preserved vibratory sensation, and proprioception at toes.  Coordination: Normal finger to nose, heel-to-shin bilaterally there  was no truncal ataxia  Gait: Rising up from seated position without assistance, normal stance, without trunk ataxia, moderate stride, good arm swing, smooth turning, able to perform tiptoe, and heel walking without difficulty.  Romberg signs: Negative  Deep tendon reflexes: Brachioradialis 2/2, biceps 2/2, triceps 2/2, patellar 2/2, Achilles 2/2, plantar responses were flexor bilaterally.   CAROTID DOPPLER 06/27/12 - Findings consistent with less than 39 percent stenosis involving the right internal carotid artery and the left internal carotid artery. - Bilateral vertebral artery flow is antegrade.  MRI HEAD WITHOUT CONTRAST 06/27/12  1. Scattered periventricular subcortical T2 hyperintensities are slightly greater than expected for age. The finding is nonspecific but can be seen in the setting of chronic microvascular ischemia, a demyelinating process such as multiple sclerosis, vasculitis, complicated migraine headaches, or as the sequelae of a prior infectious or inflammatory process.  2. No acute or focal intracranial abnormality to explain the patient's symptoms.  3. Minimal sinus disease.  4. Postoperative changes of the upper cervical spine.   MRA HEAD WITHOUT CONTRAST 06/27/12  1. Mild small vessel disease.  2. No significant proximal stenosis, aneurysm, or branch vessel  occlusion.    ASSESSMENT AND PLAN  Samantha [name] is an 73 y.o. right-handed AA female with vascular risk factors of diabetes, hypertension, hyperlipidemia, and obesity with possible TIA on Jun 27, 2012. MRI showed mild small vessel disease. She was on Aspirin 81 mg prior to admission and is now on Plavix 75mg  daily. She now complains of right arm muscle deep achy pain, most consistent with musculoskeletal etiology.  PLAN:  Continue clopidogrel 75 mg orally every day for secondary stroke prevention  Continue Cymbalta 60 mg a day for chronic pain. Follow up with Dr. Krista Blue in 6 months, sooner as needed.  Philmore Pali, MSN, NP-C 04/02/2013, 10:02 AM [facility] [address] [place], [place] [address] [phone]  Note: This document was prepared with digital dictation and possible smart phrase technology. Any transcriptional errors that result from this process are unintentional.

## 2013-04-02 NOTE — Patient Instructions (Signed)
PLAN:  Continue clopidogrel 75 mg orally every day for secondary stroke prevention  Continue Cymbalta 60 mg a day.  Follow up in 6 months with Dr. Krista Blue.  Generalized Anxiety Disorder Generalized anxiety disorder (GAD) is a mental disorder. It interferes with life functions, including relationships, work, and school. GAD is different from normal anxiety, which everyone experiences at some point in their lives in response to specific life events and activities. Normal anxiety actually helps [place] prepare for and get through these life events and activities. Normal anxiety goes away after the event or activity is over.  GAD causes anxiety that is not necessarily related to specific events or activities. It also causes excess anxiety in proportion to specific events or activities. The anxiety associated with GAD is also difficult to control. GAD can vary from mild to severe. People with severe GAD can have intense waves of anxiety with physical symptoms (panic attacks).  SYMPTOMS The anxiety and worry associated with GAD are difficult to control. This anxiety and worry are related to many life events and activities and also occur more days than not for 6 months or longer. People with GAD also have three or more of the following symptoms (one or more in children):  Restlessness.   Fatigue.  Difficulty concentrating.   Irritability.  Muscle tension.  Difficulty sleeping or unsatisfying sleep. DIAGNOSIS GAD is diagnosed through an assessment by your caregiver. Your caregiver will ask you questions aboutyour mood,physical symptoms, and events in your life. Your caregiver may ask you about your medical history and use of alcohol or drugs, including prescription medications. Your caregiver may also do a physical exam and blood tests. Certain medical conditions and the use of certain substances can cause symptoms similar to those associated with GAD. Your caregiver may refer you to a mental health  specialist for further evaluation. TREATMENT The following therapies are usually used to treat GAD:   Medication Antidepressant medication usually is prescribed for long-term daily control. Antianxiety medications may be added in severe cases, especially when panic attacks occur.   Talk therapy (psychotherapy) Certain types of talk therapy can be helpful in treating GAD by providing support, education, and guidance. A form of talk therapy called cognitive behavioral therapy can teach you healthy ways to think about and react to daily life events and activities.  Stress managementtechniques These include yoga, meditation, and exercise and can be very helpful when they are practiced regularly. A mental health specialist can help determine which treatment is best for you. Some people see improvement with one therapy. However, other people require a combination of therapies. Document Released: 05/12/2012 Document Reviewed: 05/12/2012 [facility] Patient Information 2014 [place], [place].

## 2013-04-04 ENCOUNTER — Other Ambulatory Visit: Payer: Self-pay | Admitting: Cardiology

## 2013-05-07 ENCOUNTER — Other Ambulatory Visit: Payer: Self-pay

## 2013-05-12 ENCOUNTER — Telehealth: Payer: Self-pay

## 2013-05-12 NOTE — Telephone Encounter (Addendum)
Left message for call back Non identifiable Medication and allergies:  Reviewed and updated  90 day supply/mail order: na Local pharmacy: [facility] [employer] [facility]   Immunizations due:  UTD  A/P:   No changes to FH, PSH or Personal Hx Flu vaccine--11/2012 Tdap--07/2011 PNA--10/2008 Shingles vaccine--11/2012 MMG--last 2012--neg bi rads [facility]--09/2004--hx of adenomatous polyps  Lost brother within the last month  To Discuss with Provider: Not at this time

## 2013-05-13 ENCOUNTER — Encounter: Payer: Self-pay | Admitting: Internal Medicine

## 2013-05-13 ENCOUNTER — Ambulatory Visit (INDEPENDENT_AMBULATORY_CARE_PROVIDER_SITE_OTHER): Payer: Medicare Other | Admitting: Internal Medicine

## 2013-05-13 VITALS — BP 153/83 | HR 60 | Temp 98.0°F | Ht 64.0 in | Wt 192.0 lb

## 2013-05-13 DIAGNOSIS — IMO0001 Reserved for inherently not codable concepts without codable children: Secondary | ICD-10-CM | POA: Diagnosis not present

## 2013-05-13 DIAGNOSIS — Z1321 Encounter for screening for nutritional disorder: Secondary | ICD-10-CM

## 2013-05-13 DIAGNOSIS — I10 Essential (primary) hypertension: Secondary | ICD-10-CM

## 2013-05-13 DIAGNOSIS — E119 Type 2 diabetes mellitus without complications: Secondary | ICD-10-CM

## 2013-05-13 DIAGNOSIS — Z0000 Encounter for general adult medical examination without abnormal findings: Secondary | ICD-10-CM | POA: Diagnosis not present

## 2013-05-13 DIAGNOSIS — E785 Hyperlipidemia, unspecified: Secondary | ICD-10-CM | POA: Diagnosis not present

## 2013-05-13 LAB — BASIC METABOLIC PANEL
BUN: 21 mg/dL (ref 6–23)
CALCIUM: 9.6 mg/dL (ref 8.4–10.5)
CO2: 31 meq/L (ref 19–32)
CREATININE: 1 mg/dL (ref 0.4–1.2)
Chloride: 99 mEq/L (ref 96–112)
GFR: 72.8 mL/min (ref >60.00)
GLUCOSE: 100 mg/dL — AB (ref 70–99)
Potassium: 3.7 mEq/L (ref 3.5–5.1)
Sodium: 138 mEq/L (ref 135–145)

## 2013-05-13 LAB — ALT: ALT: 20 U/L (ref 0–35)

## 2013-05-13 LAB — LIPID PANEL
Cholesterol: 225 mg/dL — ABNORMAL HIGH (ref 0–200)
HDL: 75.5 mg/dL (ref >39.00)
LDL Cholesterol: 131 mg/dL — ABNORMAL HIGH (ref 0–99)
Total CHOL/HDL Ratio: 3
Triglycerides: 91 mg/dL (ref 0.0–149.0)
VLDL: 18.2 mg/dL (ref 0.0–40.0)

## 2013-05-13 LAB — AST: AST: 21 U/L (ref 0–37)

## 2013-05-13 LAB — HEMOGLOBIN A1C: HEMOGLOBIN A1C: 6.1 % (ref 4.6–6.5)

## 2013-05-13 NOTE — Progress Notes (Signed)
Pre visit review using our clinic review tool, if applicable. No additional management support is needed unless otherwise documented below in the visit note. 

## 2013-05-13 NOTE — Assessment & Plan Note (Signed)
Currently well controlled with lyrica and Cymbalta

## 2013-05-13 NOTE — Assessment & Plan Note (Signed)
Good compliance with medication, labs. Sees the eye doctor regulalrly Feet care  Discussed

## 2013-05-13 NOTE — Assessment & Plan Note (Signed)
In ambulatory setting, ambulatory BPs usually in the 130s. No change. Labs

## 2013-05-13 NOTE — Patient Instructions (Addendum)
Get your blood work before you leave   Consider take PREVNAR when available  Next visit is for routine check up regards your blood sugar , blood pressure  in 6 months ,  fasting Please make an appointment      Fall Prevention and [place] cause injuries and can affect all age groups. It is possible to use preventive measures to significantly decrease the likelihood of falls. There are many simple measures which can make your home safer and prevent falls. OUTDOORS  Repair cracks and edges of walkways and driveways.  Remove high doorway thresholds.  Trim shrubbery on the main path into your home.  Have good outside lighting.  Clear walkways of tools, rocks, debris, and clutter.  Check that handrails are not broken and are securely fastened. Both sides of steps should have handrails.  Have leaves, snow, and ice cleared regularly.  Use sand or salt on walkways during winter months.  In the garage, clean up grease or oil spills. BATHROOM  Install night lights.  Install grab bars by the toilet and in the tub and shower.  Use non-skid mats or decals in the tub or shower.  Place a plastic non-slip stool in the shower to sit on, if needed.  Keep floors dry and clean up all water on the floor immediately.  Remove soap buildup in the tub or shower on a regular basis.  Secure bath mats with non-slip, double-sided rug tape.  Remove throw rugs and tripping hazards from the floors. BEDROOMS  Install night lights.  Make sure a bedside light is easy to reach.  Do not use oversized bedding.  Keep a telephone by your bedside.  Have a firm chair with side arms to use for getting dressed.  Remove throw rugs and tripping hazards from the floor. KITCHEN  Keep handles on pots and pans turned toward the center of the stove. Use back burners when possible.  Clean up spills quickly and allow time for drying.  Avoid walking on wet floors.  Avoid hot utensils and  knives.  Position shelves so they are not too high or low.  Place commonly used objects within easy reach.  If necessary, use a sturdy step stool with a grab bar when reaching.  Keep electrical cables out of the way.  Do not use floor polish or wax that makes floors slippery. If you must use wax, use non-skid floor wax.  Remove throw rugs and tripping hazards from the floor. STAIRWAYS  Never leave objects on stairs.  Place handrails on both sides of stairways and use them. Fix any loose handrails. Make sure handrails on both sides of the stairways are as long as the stairs.  Check carpeting to make sure it is firmly attached along stairs. Make repairs to worn or loose carpet promptly.  Avoid placing throw rugs at the top or bottom of stairways, or properly secure the rug with carpet tape to prevent slippage. Get rid of throw rugs, if possible.  Have an electrician put in a light switch at the top and bottom of the stairs. OTHER FALL PREVENTION TIPS  Wear low-heel or rubber-soled shoes that are supportive and fit well. Wear closed toe shoes.  When using a stepladder, make sure it is fully opened and both spreaders are firmly locked. Do not climb a closed stepladder.  Add color or contrast paint or tape to grab bars and handrails in your home. Place contrasting color strips on first and last steps.  Learn  and use mobility aids as needed. Install an electrical emergency response system.  Turn on lights to avoid dark areas. Replace light bulbs that burn out immediately. Get light switches that glow.  Arrange furniture to create clear pathways. Keep furniture in the same place.  Firmly attach carpet with non-skid or double-sided tape.  Eliminate uneven floor surfaces.  Select a carpet pattern that does not visually hide the edge of steps.  Be aware of all pets. OTHER HOME SAFETY TIPS  Set the water temperature for 120 F (48.8 C).  Keep emergency numbers on or near the  telephone.  Keep smoke detectors on every level of the home and near sleeping areas. Document Released: 01/05/2002 Document Revised: 07/17/2011 Document Reviewed: 04/06/2011 [facility] Patient Information 2014 [place].

## 2013-05-13 NOTE — Progress Notes (Signed)
Subjective:    Patient ID: Samantha [name], female    DOB: 14-Nov-1940, 73 y.o.   MRN: [id number]  DOS:  05/13/2013 Type of  Visit:  Here for Medicare AWV:  1. Risk factors based on Past M, S, F history: reviewed  2. Physical Activities: busy at home, some exercise  3. Depression/mood: No problems noted or reported   4. Hearing: No problems noted or reported  5. ADL's: Independent  6. Fall Risk: + risk, no recent falls , prevention discussed  7. home Safety: does feel safe at home  8. Height, weight, &visual acuity: see VS, s/p lenses implant, great results ; sees eye doctor regulalrly 9. Counseling: provided  10. Labs ordered based on risk factors: if needed  11. Referral Coordination: if needed  12. Care Plan, see assessment and plan  13. Cognitive Assessment: Cognition and motor skills within normal   In addition, today we discussed the following: Hypertension, good medication compliance, BP today slightly elevated, at home is usually in the 130s, 143. Fibromyalgia, on Lyrica and Cymbalta, currently symptoms well-controlled Diabetes, on metformin, CBGs is usually 96, 100. No low blood sugar symptoms. TIA, recently seen by neurology, felt to be stable.   ROS No  CP, + SOB ("only in closed spaces") rare palpitations, no lower extremity edema Denies  nausea, vomiting diarrhea Denies  blood in the stools (-) persistent  cough, no sputum production No dysuria, gross hematuria, difficulty urinating   No headaches. Denies dizziness     Past Medical History  Diagnosis Date  . OSA (obstructive sleep apnea) 09/2007    dx w/ a sleep study, Rx CPAP, weight loss  . Hyperlipidemia   . Hypertension   . Chest pain, atypical 11/2006    saw cards: neg/normal Holter, ECHo and myoview, admitted w/ CP 4/09 neg enz.and CT chest had a 9 beat NSVT  . Fibromyalgia   . Abnormal CT of the chest 2008    follow up CT, April 2009: unchanged. No f/u suggested   . Tumor, thyroid     partial  thyroidectomy in the 60s  . Shingles 11/2009  . Vaginal dysplasia   . Vaginal cancer 1994  . Type II diabetes mellitus   . Heart murmur   . CHF (congestive heart failure)   . Pneumonia     "double" (06/27/2012)  . History of chronic bronchitis   . Asthma   . H/O hiatal hernia   . GERD (gastroesophageal reflux disease)   . Ocular migraine   . TIA (transient ischemic attack) 06/27/2012    "this is my first" (06/27/2012)  . Rheumatoid factor positive   . Osteoarthritis   . Reactive airway disease 01/29/2002    dx of pseudoasthma / vcd in 2005 and nl sprirometry History of dyspnea, 2011,  improved after several medications were changed around Question of COPD, disproved July 06, 2009 with nl pft's        Past Surgical History  Procedure Laterality Date  . Thyroidectomy, partial  1960's  . Bunionectomy Left ~ 1977  . Breast biopsy Right 1999  . Anterior cervical decomp/discectomy fusion  2001  . Cataract extraction w/ intraocular lens  implant, bilateral  2012  . Vaginal mass excision  1994    "Laser surgery for vaginal cancer; followed by chemotherapy" (06/27/2012)  . Abdominal hysterectomy  1980    History   Social History  . Marital Status: Married    Spouse Name: Ilona Sorrel    Number of  Children: 2  . Years of Education: masters   Occupational History  . Retired, disable since 2000    Social History Main Topics  . Smoking status: Former Smoker    Types: Cigarettes  . Smokeless tobacco: Never Used     Comment: Quit in 2001  . Alcohol Use: No  . Drug Use: No  . Sexual Activity: No   Other Topics Concern  . Not on file   Social History Narrative   On disability since 2000--- also husband has MS   Education. College.   Right handed.     Family History  Problem Relation Age of Onset  . Allergies Sister   . Parkinsonism Sister     possible  . Asthma Sister   . Asthma Paternal Grandmother   . Heart disease Father   . Heart disease Mother   . Lung cancer Mother     . Heart disease      paternal grandparents, maternal grandparents,   . Heart disease Brother   . Emphysema Brother   . Aneurysm Brother     x3  . Kidney failure Brother   . Diabetes Brother   . Breast cancer Neg Hx   . Colon cancer Neg Hx   . Diabetes Brother       Medication List       This list is accurate as of: 05/13/13  5:35 PM.  Always use your most recent med list.               albuterol 108 (90 BASE) MCG/ACT inhaler  Commonly known as:  VENTOLIN HFA  INHALE 2 PUFFS INTO THE LUNGS EVERY 6 (SIX) HOURS AS NEEDED.     CALCIUM PO  Take 1 tablet by mouth daily.     cetirizine 10 MG tablet  Commonly known as:  ZYRTEC  Take 1 tablet (10 mg total) by mouth daily.     cholecalciferol 1000 UNITS tablet  Commonly known as:  VITAMIN D  Take 1,000 Units by mouth daily.     clopidogrel 75 MG tablet  Commonly known as:  PLAVIX  Take 1 tablet (75 mg total) by mouth daily with breakfast.     DULoxetine 60 MG capsule  Commonly known as:  CYMBALTA  Take 1 capsule (60 mg total) by mouth daily.     fluticasone 50 MCG/ACT nasal spray  Commonly known as:  FLONASE  Place 2 sprays into the nose as needed for rhinitis.     furosemide 20 MG tablet  Commonly known as:  LASIX  Take 20 mg by mouth 2 (two) times daily.     lisinopril 40 MG tablet  Commonly known as:  PRINIVIL,ZESTRIL  Take 1 tablet (40 mg total) by mouth daily.     metFORMIN 500 MG 24 hr tablet  Commonly known as:  GLUCOPHAGE-XR  Take 1 tablet (500 mg total) by mouth daily with breakfast.     multivitamin capsule  Take 1 capsule by mouth daily.     Nebivolol HCl 20 MG Tabs  Take 20 mg by mouth 2 (two) times daily.     OSTEO BI-FLEX JOINT SHIELD Tabs  Take 1 tablet by mouth daily.     Potassium 99 MG Tabs  Take 99 mg by mouth daily.     pregabalin 100 MG capsule  Commonly known as:  LYRICA  TAKE ONE CAPSULE BY MOUTH AT BEDTIME AS NEEDED     ranitidine 300 MG tablet  Commonly known as:  ZANTAC   TAKE 1 TABLET (300 MG TOTAL) BY MOUTH AT BEDTIME.     SYSTANE 0.4-0.3 % Gel  Generic drug:  Polyethyl Glycol-Propyl Glycol  Apply 1 drop to eye daily as needed (dry eyes).           Objective:   Physical Exam BP 153/83  Pulse 60  Temp(Src) 98 F (36.7 C)  Ht 5\' 4"  (1.626 m)  Wt 192 lb (87.091 kg)  BMI 32.94 kg/m2  SpO2 97% General -- alert, well-developed, NAD.  Neck --no thyromegaly , normal carotid pulse  HEENT-- Not pale.  Lungs -- normal respiratory effort, no intercostal retractions, no accessory muscle use, and normal breath sounds.  Heart-- normal rate, regular rhythm, mi;ld syst  murmur.  Abdomen-- Not distended, good bowel sounds,soft, non-tender. No bruit Extremities-- no pretibial edema bilaterally  Neurologic--  alert & oriented X3. Speech normal, gait normal, strength normal in all extremities.  Psych-- Cognition and judgment appear intact. Cooperative with normal attention span and concentration. No anxious or depressed appearing.        Assessment & Plan:

## 2013-05-13 NOTE — Assessment & Plan Note (Addendum)
Currently on no  medication due to to aches and pains. In retrospect, pain may have been due to fibromyalgia. Plan: Labs

## 2013-05-13 NOTE — Assessment & Plan Note (Addendum)
TD  07-2011  pneumonia shot 2004 and 2010  rec prevnar zostavax ==> 11-2012 Female care per gynecology, to see Dr Orvan Seen  Last [facility]  Per our records 08-2010 (-) , but reports had a more recent MMG @ gyn Last DEXA? Reportedly @ gyn  Colonoscopy: 2006, next was due 2011, GI letter printed, encouraged to call them Diet-exercise discussed

## 2013-05-14 DIAGNOSIS — M67919 Unspecified disorder of synovium and tendon, unspecified shoulder: Secondary | ICD-10-CM | POA: Diagnosis not present

## 2013-05-14 DIAGNOSIS — R6889 Other general symptoms and signs: Secondary | ICD-10-CM | POA: Diagnosis not present

## 2013-05-14 DIAGNOSIS — M25579 Pain in unspecified ankle and joints of unspecified foot: Secondary | ICD-10-CM | POA: Diagnosis not present

## 2013-05-15 MED ORDER — ATORVASTATIN CALCIUM 10 MG PO TABS: 10.0000 mg | ORAL_TABLET | Freq: Every day | ORAL | Status: DC

## 2013-05-15 NOTE — Addendum Note (Signed)
Addended by: Peggyann Shoals on: 05/15/2013 05:23 PM   Modules accepted: Orders

## 2013-05-26 ENCOUNTER — Telehealth: Payer: Self-pay

## 2013-05-26 NOTE — Telephone Encounter (Signed)
Relevant patient education assigned to patient using Emmi. ° °

## 2013-06-13 ENCOUNTER — Other Ambulatory Visit: Payer: Self-pay | Admitting: Internal Medicine

## 2013-06-24 ENCOUNTER — Other Ambulatory Visit: Payer: Self-pay | Admitting: Internal Medicine

## 2013-08-11 ENCOUNTER — Other Ambulatory Visit (INDEPENDENT_AMBULATORY_CARE_PROVIDER_SITE_OTHER): Payer: Medicare Other

## 2013-08-11 DIAGNOSIS — E785 Hyperlipidemia, unspecified: Secondary | ICD-10-CM

## 2013-08-11 LAB — LIPID PANEL
CHOLESTEROL: 176 mg/dL (ref 0–200)
HDL: 66.8 mg/dL (ref >39.00)
LDL CALC: 90 mg/dL (ref 0–99)
NonHDL: 109.2
Total CHOL/HDL Ratio: 3
Triglycerides: 96 mg/dL (ref 0.0–149.0)
VLDL: 19.2 mg/dL (ref 0.0–40.0)

## 2013-08-11 LAB — ALT: ALT: 15 U/L (ref 0–35)

## 2013-08-11 LAB — AST: AST: 19 U/L (ref 0–37)

## 2013-08-13 ENCOUNTER — Telehealth: Payer: Self-pay | Admitting: *Deleted

## 2013-08-13 MED ORDER — CYCLOBENZAPRINE HCL 10 MG PO TABS: 10.0000 mg | ORAL_TABLET | Freq: Two times a day (BID) | ORAL | Status: DC | PRN

## 2013-08-13 NOTE — Telephone Encounter (Signed)
Advise patient: If symptoms are severe or if she has fever, chills, cough a rash: She needs to be seen. Otherwise, okay to use Flexeril twice a day to see if that works, a prescription was sent. Tell pt  may cause drowsiness, needs to be careful

## 2013-08-13 NOTE — Telephone Encounter (Signed)
Pt c/o body aches x 4days. Pt states that she is having deep joint / bone pain mainly in her leg and hip ,also muscle spasms and troubles sleeping. Pt states that the voltaren gel isnt working requesting pain or muscle relaxant rx . Please advise

## 2013-08-14 NOTE — Telephone Encounter (Signed)
Pt.notified

## 2013-09-08 ENCOUNTER — Encounter (HOSPITAL_COMMUNITY): Payer: Self-pay | Admitting: Emergency Medicine

## 2013-09-08 ENCOUNTER — Emergency Department (INDEPENDENT_AMBULATORY_CARE_PROVIDER_SITE_OTHER)
Admission: EM | Admit: 2013-09-08 | Discharge: 2013-09-08 | Disposition: A | Discharge status: Home / Self Care | Payer: Medicare Other | Source: Home / Self Care | Attending: Family Medicine | Admitting: Family Medicine

## 2013-09-08 ENCOUNTER — Telehealth: Payer: Self-pay | Admitting: Internal Medicine

## 2013-09-08 DIAGNOSIS — J45909 Unspecified asthma, uncomplicated: Secondary | ICD-10-CM | POA: Diagnosis not present

## 2013-09-08 DIAGNOSIS — I10 Essential (primary) hypertension: Secondary | ICD-10-CM | POA: Diagnosis not present

## 2013-09-08 DIAGNOSIS — N390 Urinary tract infection, site not specified: Secondary | ICD-10-CM | POA: Diagnosis not present

## 2013-09-08 LAB — POCT URINALYSIS DIP (DEVICE)
BILIRUBIN URINE: NEGATIVE
GLUCOSE, UA: NEGATIVE mg/dL
Ketones, ur: NEGATIVE mg/dL
NITRITE: POSITIVE — AB
Protein, ur: 100 mg/dL — AB
Specific Gravity, Urine: 1.01 (ref 1.005–1.030)
Urobilinogen, UA: 0.2 mg/dL (ref 0.0–1.0)
pH: 5.5 (ref 5.0–8.0)

## 2013-09-08 MED ORDER — ONDANSETRON 4 MG PO TBDP: 4.0000 mg | ORAL_TABLET | Freq: Three times a day (TID) | ORAL | Status: DC | PRN

## 2013-09-08 MED ORDER — SULFAMETHOXAZOLE-TMP DS 800-160 MG PO TABS: 1.0000 | ORAL_TABLET | Freq: Two times a day (BID) | ORAL | Status: DC

## 2013-09-08 MED ORDER — ONDANSETRON 4 MG PO TBDP
ORAL_TABLET | ORAL | Status: AC
  Filled 2013-09-08: qty 1

## 2013-09-08 MED ORDER — ONDANSETRON 4 MG PO TBDP
4.0000 mg | ORAL_TABLET | Freq: Once | ORAL | Status: AC
  Administered 2013-09-08: 4 mg via ORAL

## 2013-09-08 NOTE — Discharge Instructions (Signed)
You have a urinary tract infection Please start your antibioitics tonight and take them until they are completely gone Please start eating more yogurt, cheeses, and probiotic products. Do this until the antibiotics are complete Please continue using your inhaler regularly for your yoru asthma Take all your othe rmedications as prescribed Please come back if you get worse  Urinary Tract Infection Urinary tract infections (UTIs) can develop anywhere along your urinary tract. Your urinary tract is your body's drainage system for removing wastes and extra water. Your urinary tract includes two kidneys, two ureters, a bladder, and a urethra. Your kidneys are a pair of bean-shaped organs. Each kidney is about the size of your fist. They are located below your ribs, one on each side of your spine. CAUSES Infections are caused by microbes, which are microscopic organisms, including fungi, viruses, and bacteria. These organisms are so small that they can only be seen through a microscope. Bacteria are the microbes that most commonly cause UTIs. SYMPTOMS  Symptoms of UTIs may vary by age and gender of the patient and by the location of the infection. Symptoms in young women typically include a frequent and intense urge to urinate and a painful, burning feeling in the bladder or urethra during urination. Older women and men are more likely to be tired, shaky, and weak and have muscle aches and abdominal pain. A fever may mean the infection is in your kidneys. Other symptoms of a kidney infection include pain in your back or sides below the ribs, nausea, and vomiting. DIAGNOSIS To diagnose a UTI, your caregiver will ask you about your symptoms. Your caregiver also will ask to provide a urine sample. The urine sample will be tested for bacteria and white blood cells. White blood cells are made by your body to help fight infection. TREATMENT  Typically, UTIs can be treated with medication. Because most UTIs are  caused by a bacterial infection, they usually can be treated with the use of antibiotics. The choice of antibiotic and length of treatment depend on your symptoms and the type of bacteria causing your infection. HOME CARE INSTRUCTIONS  If you were prescribed antibiotics, take them exactly as your caregiver instructs you. Finish the medication even if you feel better after you have only taken some of the medication.  Drink enough water and fluids to keep your urine clear or pale yellow.  Avoid caffeine, tea, and carbonated beverages. They tend to irritate your bladder.  Empty your bladder often. Avoid holding urine for long periods of time.  Empty your bladder before and after sexual intercourse.  After a bowel movement, women should cleanse from front to back. Use each tissue only once. SEEK MEDICAL CARE IF:   You have back pain.  You develop a fever.  Your symptoms do not begin to resolve within 3 days. SEEK IMMEDIATE MEDICAL CARE IF:   You have severe back pain or lower abdominal pain.  You develop chills.  You have nausea or vomiting.  You have continued burning or discomfort with urination. MAKE SURE YOU:   Understand these instructions.  Will watch your condition.  Will get help right away if you are not doing well or get worse. Document Released: 10/25/2004 Document Revised: 07/17/2011 Document Reviewed: 02/23/2011 [facility] Patient Information 2015 [place], [place]. This information is not intended to replace advice given to you by your health care provider. Make sure you discuss any questions you have with your health care provider.

## 2013-09-08 NOTE — ED Notes (Signed)
Patient is concerned she has a uti: foul smelling urine, frequency, low abdominal pain.  But also has had diarrhea for 2 days.  Patient has lost appetite

## 2013-09-08 NOTE — ED Provider Notes (Signed)
CSN: [id number]     Arrival date & time 09/08/13  1808 History   First MD Initiated Contact with Patient 09/08/13 1855     Chief Complaint  Patient presents with  . Urinary Tract Infection   (Consider location/radiation/quality/duration/timing/severity/associated sxs/prior Treatment) Patient is a 73 y.o. female presenting with urinary tract infection.  Urinary Tract Infection   Dysuria nad lower abdominal pain and frequency started 4 days ago. Somewhat improved but now w/ increasing feelings of nausea, adn general ill feeling. Denies fevers, flank pain, rash, lightheadedness, HA, CP, SOB.   Asthma: pt needing to use albuterol daily lately which is an increase for her. No overt SOB. Wheezing improves w/ albuterol  HTN: taking all medciations. No CP, SOB, palpitations.    Past Medical History  Diagnosis Date  . OSA (obstructive sleep apnea) 09/2007    dx w/ a sleep study, Rx CPAP, weight loss  . Hyperlipidemia   . Hypertension   . Chest pain, atypical 11/2006    saw cards: neg/normal Holter, ECHo and myoview, admitted w/ CP 4/09 neg enz.and CT chest had a 9 beat NSVT  . Fibromyalgia   . Abnormal CT of the chest 2008    follow up CT, April 2009: unchanged. No f/u suggested   . 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 Shingles 11/2009  . Vaginal dysplasia   . Vaginal cancer 1994  . Type II diabetes mellitus   . Heart murmur   . CHF (congestive heart failure)   . Pneumonia     "double" (06/27/2012)  . History of chronic bronchitis   . Asthma   . H/O hiatal hernia   . GERD (gastroesophageal reflux disease)   . Ocular migraine   . TIA (transient ischemic attack) 06/27/2012    "this is my first" (06/27/2012)  . Rheumatoid factor positive   . Osteoarthritis   . Reactive airway disease 01/29/2002    dx of pseudoasthma / vcd in 2005 and nl sprirometry History of dyspnea, 2011,  improved after several medications were changed around Question of COPD, disproved July 06, 2009 with nl pft's       Past Surgical History  Procedure Laterality Date  . Thyroidectomy, partial  1960's  . Bunionectomy Left ~ 1977  . Breast biopsy Right 1999  . Anterior cervical decomp/discectomy fusion  2001  . Cataract extraction w/ intraocular lens  implant, bilateral  2012  . Vaginal mass excision  1994    "Laser surgery for vaginal cancer; followed by chemotherapy" (06/27/2012)  . Abdominal hysterectomy  1980   Family History  Problem Relation Age of Onset  . Allergies Sister   . Parkinsonism Sister     possible  . Asthma Sister   . Asthma Paternal Grandmother   . Heart disease Father   . Heart disease Mother   . Lung cancer Mother   . Heart disease      paternal grandparents, maternal grandparents,   . Heart disease Brother   . Emphysema Brother   . Aneurysm Brother     x3  . Kidney failure Brother   . Diabetes Brother   . Breast cancer Neg Hx   . Colon cancer Neg Hx   . Diabetes Brother    History  Substance Use Topics  . Smoking status: Former Smoker    Types: Cigarettes  . Smokeless tobacco: Never Used     Comment: Quit in 2001  . Alcohol Use: No  OB History   Grav Para Term Preterm Abortions TAB SAB Ect Mult Living                 Review of Systems Per HPI with all other pertinent systems negative.   Allergies  Cefuroxime axetil; Oxycodone; Seldane; Zocor; Tramadol; and Pravastatin  Home Medications   Prior to Admission medications   Medication Sig Start Date End Date Taking? Authorizing Provider  albuterol (VENTOLIN HFA) 108 (90 BASE) MCG/ACT inhaler INHALE 2 PUFFS INTO THE LUNGS EVERY 6 (SIX) HOURS AS NEEDED. 10/06/12   Colon Branch, MD  atorvastatin (LIPITOR) 10 MG tablet Take 1 tablet (10 mg total) by mouth daily. 05/15/13   Colon Branch, MD  CALCIUM PO Take 1 tablet by mouth daily.      Historical Provider, MD  cetirizine (ZYRTEC) 10 MG tablet Take 1 tablet (10 mg total) by mouth daily. 12/23/12   Colon Branch, MD  cholecalciferol (VITAMIN D)  1000 UNITS tablet Take 1,000 Units by mouth daily.    Historical Provider, MD  clopidogrel (PLAVIX) 75 MG tablet Take 1 tablet (75 mg total) by mouth daily with breakfast. 04/02/13   Philmore Pali, NP  cyclobenzaprine (FLEXERIL) 10 MG tablet Take 1 tablet (10 mg total) by mouth 2 (two) times daily as needed for muscle spasms. 08/13/13   Colon Branch, MD  DULoxetine (CYMBALTA) 60 MG capsule Take 1 capsule (60 mg total) by mouth daily. 11/27/12   Marcial Pacas, MD  fluticasone (FLONASE) 50 MCG/ACT nasal spray Place 2 sprays into the nose as needed for rhinitis.    Historical Provider, MD  furosemide (LASIX) 20 MG tablet Take 20 mg by mouth 2 (two) times daily.    Historical Provider, MD  lisinopril (PRINIVIL,ZESTRIL) 40 MG tablet Take 1 tablet (40 mg total) by mouth daily. 12/23/12   Colon Branch, MD  LYRICA 100 MG capsule TAKE 1 CAPSULE AT BEDTIME AS NEEDED 06/24/13   Colon Branch, MD  metFORMIN (GLUCOPHAGE-XR) 500 MG 24 hr tablet TAKE 1 TABLET (500 MG TOTAL) BY MOUTH DAILY WITH BREAKFAST.    Colon Branch, MD  Misc Natural Products (OSTEO BI-FLEX JOINT SHIELD) TABS Take 1 tablet by mouth daily.    Historical Provider, MD  Multiple Vitamin (MULTIVITAMIN) capsule Take 1 capsule by mouth daily.      Historical Provider, MD  Nebivolol HCl 20 MG TABS Take 20 mg by mouth 2 (two) times daily. 09/16/12   Liliane Shi, PA-C  ondansetron (ZOFRAN-ODT) 4 MG disintegrating tablet Take 1 tablet (4 mg total) by mouth every 8 (eight) hours as needed for nausea or vomiting. 09/08/13   Waldemar Dickens, MD  Polyethyl Glycol-Propyl Glycol (SYSTANE) 0.4-0.3 % GEL Apply 1 drop to eye daily as needed (dry eyes).    Historical Provider, MD  Potassium 99 MG TABS Take 99 mg by mouth daily.     Historical Provider, MD  ranitidine (ZANTAC) 300 MG tablet TAKE 1 TABLET (300 MG TOTAL) BY MOUTH AT BEDTIME. 03/18/13   Colon Branch, MD  sulfamethoxazole-trimethoprim (BACTRIM DS) 800-160 MG per tablet Take 1 tablet by mouth 2 (two) times daily. 09/08/13    Waldemar Dickens, MD   BP 172/85  Pulse 74  Temp(Src) 99.7 F (37.6 C) (Oral)  Resp 18  SpO2 98% Physical Exam  Constitutional: She is oriented to person, place, and time. She appears well-developed and well-nourished.  HENT:  Head: Normocephalic and atraumatic.  Eyes: EOM are  normal. Pupils are equal, round, and reactive to light.  Neck: Normal range of motion. Neck supple.  Cardiovascular: Normal rate and normal heart sounds.   No murmur heard. Pulmonary/Chest: Effort normal and breath sounds normal. No respiratory distress. She has no wheezes. She has no rales. She exhibits no tenderness.  Abdominal: Soft.  Suprapubic tenderness  Musculoskeletal: She exhibits no edema.  No CVA tenderness  Neurological: She is alert and oriented to person, place, and time.  Skin: Skin is warm and dry. No rash noted. She is not diaphoretic. No erythema. No pallor.  Psychiatric: She has a normal mood and affect. Her behavior is normal. Judgment and thought content normal.    ED Course  Procedures (including critical care time) Labs Review Labs Reviewed  POCT URINALYSIS DIP (DEVICE) - Abnormal; Notable for the following:    Hgb urine dipstick MODERATE (*)    Protein, ur 100 (*)    Nitrite POSITIVE (*)    Leukocytes, UA SMALL (*)    All other components within normal limits  URINE CULTURE    Imaging Review No results found.   MDM   1. UTI (lower urinary tract infection)   2. Essential hypertension   3. Asthma, unspecified asthma severity, uncomplicated     UTI: UA by symptoms and UA.  UCX Start Bactrim DS (allergy to Cefuroxime, Cipro concern for causing Cdiff, Macrobid concern given asthma history) zofran for nausea  HTN: taking medications but elevated likely secondary to illness. Assymptomatic. No change  Asthma: No wheezing on exam and pt deferring steroids at home. OK w/ this plan so long as pt stays on regular albuterol treatements over the next few days  Precautions  given and all questions answered   Linna Darner, MD Family Medicine 09/08/2013, 7:49 PM      Waldemar Dickens, MD 09/08/13 1949

## 2013-09-08 NOTE — Telephone Encounter (Signed)
Patient Information:  Caller Name: Samantha [name]  Phone: [phone]  Patient: Samantha [name], Samantha [name]  Gender: Female  DOB: 13-Jun-1940  Age: 73 Years  PCP: Kathlene November  Office Follow Up:  Does the office need to follow up with this patient?: No  Instructions For The Office: N/A  RN Note:  Patient has multiple symptoms that are presenting that individually are only bothersome however with multiple symptoms draws concern in regards to overall impact to patients health.  Patient has had foul smell to urine with some burning off and on with urination and states that she can urinate each time she tries however it takes a while to finish.  Patient has cough and cold symptoms with headache for the last two days that worsens when bending over or coughing.  No shortness of breath at this time.  Has had decreased appetite, weakness, and dizziness with Diarrhea over the last 3 days and states that everything she eats is running right through her.  Due to having multiple symptoms spoke with office nurse and patient will be sent to Urgent care for evaluation since no available appointments in office at this time.  Patient agrees to plan and will go to urgent care.  Symptoms  Reason For Call & Symptoms: Diarrhea x 3 days, foul smell to urine, Palpitations, decreased appetite, headache x 2 days, cough and cold symptoms, weakness, Dizziness  Reviewed Health History In EMR: Yes  Reviewed Medications In EMR: Yes  Reviewed Allergies In EMR: Yes  Reviewed Surgeries / Procedures: Yes  Date of Onset of Symptoms: 09/04/2013  Guideline(s) Used:  Headache  Urination Pain - Female  Disposition Per Guideline:   See Today in Office  Reason For Disposition Reached:   > 2 UTIs in last year  Advice Given:  Call Back If:  You become worse.  Patient Will Follow Care Advice:  YES

## 2013-09-09 ENCOUNTER — Telehealth: Payer: Self-pay | Admitting: Internal Medicine

## 2013-09-09 NOTE — Telephone Encounter (Signed)
Pt went to [facility] to be evaluated and treated.

## 2013-09-09 NOTE — Telephone Encounter (Signed)
Relevant patient education assigned to patient using Emmi. ° °

## 2013-09-11 LAB — URINE CULTURE

## 2013-09-11 NOTE — ED Notes (Signed)
Urine culture: >100,000 colonies E. Coli.  Pt. adequately treated with Bactrim DS. Samantha [name] 09/11/2013

## 2013-09-15 ENCOUNTER — Encounter: Payer: Self-pay | Admitting: Internal Medicine

## 2013-09-15 ENCOUNTER — Ambulatory Visit (INDEPENDENT_AMBULATORY_CARE_PROVIDER_SITE_OTHER): Payer: Medicare Other | Admitting: Internal Medicine

## 2013-09-15 VITALS — BP 98/64 | HR 62 | Temp 97.8°F | Wt 185.0 lb

## 2013-09-15 DIAGNOSIS — R319 Hematuria, unspecified: Secondary | ICD-10-CM | POA: Diagnosis not present

## 2013-09-15 DIAGNOSIS — T7840XA Allergy, unspecified, initial encounter: Secondary | ICD-10-CM | POA: Diagnosis not present

## 2013-09-15 DIAGNOSIS — R21 Rash and other nonspecific skin eruption: Secondary | ICD-10-CM

## 2013-09-15 DIAGNOSIS — N390 Urinary tract infection, site not specified: Secondary | ICD-10-CM

## 2013-09-15 LAB — POCT URINALYSIS DIPSTICK
GLUCOSE UA: NEGATIVE
Ketones, UA: NEGATIVE
Leukocytes, UA: NEGATIVE
NITRITE UA: NEGATIVE
SPEC GRAV UA: 1.025
Urobilinogen, UA: 0.2
pH, UA: 5

## 2013-09-15 MED ORDER — NITROFURANTOIN MONOHYD MACRO 100 MG PO CAPS: 100.0000 mg | ORAL_CAPSULE | Freq: Two times a day (BID) | ORAL | Status: DC

## 2013-09-15 NOTE — Patient Instructions (Signed)
You are allergic to Bactrim Start nitrofurantoin for 5 days, drink plenty of fluids  And if not back to normal in few days please let me know.

## 2013-09-15 NOTE — Progress Notes (Signed)
Pre-visit discussion using our clinic review tool. No additional management support is needed unless otherwise documented below in the visit note.  

## 2013-09-15 NOTE — Progress Notes (Signed)
Subjective:    Patient ID: Samantha [name], female    DOB: 07-Jul-1940, 73 y.o.   MRN: [id number]  DOS:  09/15/2013 Type of visit - description: acute History: Develop on and off mild dysuria and lower abdominal discomfort, also saw some blood when she wiped after urination. On 09/08/2013 went to the urgent care, she was diagnosed with a UTI and prescribed Bactrim. Urine culture confirm the infection with Escherichia coli pansensitive. On 09/12/2013 developed tongue swelling  to the point that she had a hard time talking, she started to use ice and the swelling gradually went away within 12 hours. She continue taking Bactrim Other sx developed in the last few days: Generalized sweats, aches and pains (More than usual), had some diarrhea that started before the abx and now is better. Spots at the hands and distal lower extremity, not on her feet. Dysuria and lower abdominal discomfort ---improved   ROS Low grade temp w/ onselt of  symptoms?, No chills. Had some nausea, no vomiting or blood in the stools.   Past Medical History  Diagnosis Date  . OSA (obstructive sleep apnea) 09/2007    dx w/ a sleep study, Rx CPAP, weight loss  . Hyperlipidemia   . Hypertension   . Chest pain, atypical 11/2006    saw cards: neg/normal Holter, ECHo and myoview, admitted w/ CP 4/09 neg enz.and CT chest had a 9 beat NSVT  . Fibromyalgia   . Abnormal CT of the chest 2008    follow up CT, April 2009: unchanged. No f/u suggested   . 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 Shingles 11/2009  . Vaginal dysplasia   . Vaginal cancer 1994  . Type II diabetes mellitus   . Heart murmur   . CHF (congestive heart failure)   . Pneumonia     "double" (06/27/2012)  . History of chronic bronchitis   . Asthma   . H/O hiatal hernia   . GERD (gastroesophageal reflux disease)   . Ocular migraine   . TIA (transient ischemic attack) 06/27/2012    "this is my first" (06/27/2012)  . Rheumatoid  factor positive   . Osteoarthritis   . Reactive airway disease 01/29/2002    dx of pseudoasthma / vcd in 2005 and nl sprirometry History of dyspnea, 2011,  improved after several medications were changed around Question of COPD, disproved July 06, 2009 with nl pft's        Past Surgical History  Procedure Laterality Date  . Thyroidectomy, partial  1960's  . Bunionectomy Left ~ 1977  . Breast biopsy Right 1999  . Anterior cervical decomp/discectomy fusion  2001  . Cataract extraction w/ intraocular lens  implant, bilateral  2012  . Vaginal mass excision  1994    "Laser surgery for vaginal cancer; followed by chemotherapy" (06/27/2012)  . Abdominal hysterectomy  1980    History   Social History  . Marital Status: Married    Spouse Name: Ilona Sorrel    Number of Children: 2  . Years of Education: masters   Occupational History  . Retired, disable since 2000    Social History Main Topics  . Smoking status: Former Smoker    Types: Cigarettes  . Smokeless tobacco: Never Used     Comment: Quit in 2001  . Alcohol Use: No  . Drug Use: No  . Sexual Activity: No   Other Topics Concern  . Not on file   Social  History Narrative   On disability since 2000--- also husband has MS   Education. College.   Right handed.        Medication List       This list is accurate as of: 09/15/13  6:28 PM.  Always use your most recent med list.               albuterol 108 (90 BASE) MCG/ACT inhaler  Commonly known as:  VENTOLIN HFA  INHALE 2 PUFFS INTO THE LUNGS EVERY 6 (SIX) HOURS AS NEEDED.     atorvastatin 10 MG tablet  Commonly known as:  LIPITOR  Take 1 tablet (10 mg total) by mouth daily.     CALCIUM PO  Take 1 tablet by mouth daily.     cetirizine 10 MG tablet  Commonly known as:  ZYRTEC  Take 1 tablet (10 mg total) by mouth daily.     cholecalciferol 1000 UNITS tablet  Commonly known as:  VITAMIN D  Take 1,000 Units by mouth daily.     clopidogrel 75 MG tablet  Commonly  known as:  PLAVIX  Take 1 tablet (75 mg total) by mouth daily with breakfast.     cyclobenzaprine 10 MG tablet  Commonly known as:  FLEXERIL  Take 1 tablet (10 mg total) by mouth 2 (two) times daily as needed for muscle spasms.     DULoxetine 60 MG capsule  Commonly known as:  CYMBALTA  Take 1 capsule (60 mg total) by mouth daily.     fluticasone 50 MCG/ACT nasal spray  Commonly known as:  FLONASE  Place 2 sprays into the nose as needed for rhinitis.     furosemide 20 MG tablet  Commonly known as:  LASIX  Take 20 mg by mouth 2 (two) times daily.     lisinopril 40 MG tablet  Commonly known as:  PRINIVIL,ZESTRIL  Take 1 tablet (40 mg total) by mouth daily.     LYRICA 100 MG capsule  Generic drug:  pregabalin  TAKE 1 CAPSULE AT BEDTIME AS NEEDED     metFORMIN 500 MG 24 hr tablet  Commonly known as:  GLUCOPHAGE-XR  TAKE 1 TABLET (500 MG TOTAL) BY MOUTH DAILY WITH BREAKFAST.     multivitamin capsule  Take 1 capsule by mouth daily.     Nebivolol HCl 20 MG Tabs  Take 20 mg by mouth 2 (two) times daily.     nitrofurantoin (macrocrystal-monohydrate) 100 MG capsule  Commonly known as:  MACROBID  Take 1 capsule (100 mg total) by mouth 2 (two) times daily.     ondansetron 4 MG disintegrating tablet  Commonly known as:  ZOFRAN-ODT  Take 1 tablet (4 mg total) by mouth every 8 (eight) hours as needed for nausea or vomiting.     OSTEO BI-FLEX JOINT SHIELD Tabs  Take 1 tablet by mouth daily.     Potassium 99 MG Tabs  Take 99 mg by mouth daily.     ranitidine 300 MG tablet  Commonly known as:  ZANTAC  TAKE 1 TABLET (300 MG TOTAL) BY MOUTH AT BEDTIME.     SYSTANE 0.4-0.3 % Gel  Generic drug:  Polyethyl Glycol-Propyl Glycol  Apply 1 drop to eye daily as needed (dry eyes).           Objective:   Physical Exam BP 98/64  Pulse 62  Temp(Src) 97.8 F (36.6 C) (Oral)  Wt 185 lb (83.915 kg)  SpO2 99% General -- alert, well-developed, NAD.  BP slightly low today, she does  look toxic HEENT-- Not pale. Oral exam normal, speech normal, lips not swolle  Lungs -- normal respiratory effort, no intercostal retractions, no accessory muscle use, and normal breath sounds.  Heart-- normal rate, regular rhythm, no murmur.  Abdomen-- Not distended, good bowel sounds,soft, non-tender. No CVA tenderness Extremities-- no pretibial edema bilaterally; Has few 6-7 mm round, flat erythematous spots at the  Lower  extremities between the knees and ankles and at the palms of her hands B.  Neurologic--  alert & oriented X3. Speech normal, gait appropriate for age, strength symmetric and appropriate for age.  Psych-- Cognition and judgment appear intact. Cooperative with normal attention span and concentration. No anxious or depressed appearing.      Assessment & Plan:  Escherichia coli UTI, Symptoms are resolving, Udip still shows some blood. Status post Bactrim for about a week. Plan: Switch from Bactrim to nitrofurantoin, drink plenty of fluids.  Reaction to Bactrim, I'm very concerned about the swelling of her tongue with difficulty talking in the setting of taking Bactrim, this could have been a reaction although it would be  atypical that the swelling went away without the patient stopping the antibiotic. Never the less I think is prudent to say that she had a reaction to Bactrim.  Multiple other symptoms: Sweats, fatigue, rash, diarrhea. Diarrhea resolved and started  before Bactrim Rash related to Bactrim? Plan: Observation  Today , I spent more than  25  min with the patient: >50% of the time counseling regards bactrim allergy. Also reviewing the chart and labs ordered by other providers

## 2013-10-06 ENCOUNTER — Encounter: Payer: Self-pay | Admitting: Neurology

## 2013-10-06 ENCOUNTER — Ambulatory Visit (INDEPENDENT_AMBULATORY_CARE_PROVIDER_SITE_OTHER): Payer: Medicare Other | Admitting: Neurology

## 2013-10-06 VITALS — BP 151/84 | HR 68 | Ht 62.0 in | Wt 187.0 lb

## 2013-10-06 DIAGNOSIS — M542 Cervicalgia: Secondary | ICD-10-CM | POA: Diagnosis not present

## 2013-10-06 DIAGNOSIS — R269 Unspecified abnormalities of gait and mobility: Secondary | ICD-10-CM | POA: Diagnosis not present
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
## 2013-10-07 ENCOUNTER — Ambulatory Visit (INDEPENDENT_AMBULATORY_CARE_PROVIDER_SITE_OTHER): Payer: Medicare Other | Admitting: Neurology

## 2013-10-07 ENCOUNTER — Encounter (INDEPENDENT_AMBULATORY_CARE_PROVIDER_SITE_OTHER): Payer: Self-pay

## 2013-10-07 DIAGNOSIS — M542 Cervicalgia: Secondary | ICD-10-CM | POA: Diagnosis not present

## 2013-10-07 DIAGNOSIS — R269 Unspecified abnormalities of gait and mobility: Secondary | ICD-10-CM

## 2013-10-07 DIAGNOSIS — Z0289 Encounter for other administrative examinations: Secondary | ICD-10-CM

## 2013-10-07 DIAGNOSIS — E119 Type 2 diabetes mellitus without complications: Secondary | ICD-10-CM

## 2013-10-07 NOTE — Procedures (Signed)
   NCS (NERVE CONDUCTION STUDY) WITH EMG (ELECTROMYOGRAPHY) REPORT   STUDY DATE: Sep 9th 2015 PATIENT NAME: Samantha [name] DOB: 1940/05/19 MRN: [id number]    TECHNOLOGIST:  Laretta Alstrom  ELECTROMYOGRAPHER: Marcial Pacas M.D.  CLINICAL INFORMATION:  73 years old female, with past medical history of cervical decompression, now presenting with neck pain, right shoulder pain, gait difficulty   FINDINGS: NERVE CONDUCTION STUDY:  Bilateral peroneal sensory responses were normal. Bilateral tibial, left peroneal motor responses were normal. Right peroneal motor responses showed mildly decreased the C. map amplitude. Bilateral tibial H. reflexes were absent.   Bilateral ulnar sensory and motor responses were normal. Bilateral median sensory response showed mildly prolonged peak latency, with normal snap amplitude. Bilateral median motor responses showed mildly prolonged distal latency, otherwise normal C. map amplitude, conduction velocity.  NEEDLE ELECTROMYOGRAPHY:  Selected needle examination was performed at the right upper extremity muscles, right cervical paraspinal muscles.  Needle examination right brachioradialis, pronator teres, biceps, triceps, deltoid, extensor digital communis was normal.  There was no spontaneous activity at right cervical paraspinal muscles, right C5, 6, 7.   IMPRESSION:   This is a mild abnormal study. 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 There was no evidence of right cervical radiculopathy.    INTERPRETING PHYSICIAN:   Marcial Pacas M.D. Ph.D. [facility] [address] [place],  [address] [phone]

## 2013-10-10 ENCOUNTER — Other Ambulatory Visit: Payer: Self-pay | Admitting: Internal Medicine

## 2013-10-22 ENCOUNTER — Encounter: Payer: Self-pay | Admitting: Cardiology

## 2013-10-22 ENCOUNTER — Ambulatory Visit (INDEPENDENT_AMBULATORY_CARE_PROVIDER_SITE_OTHER): Payer: Medicare Other | Admitting: Cardiology

## 2013-10-22 VITALS — BP 160/80 | HR 70 | Ht 62.0 in | Wt 189.0 lb

## 2013-10-22 DIAGNOSIS — I10 Essential (primary) hypertension: Secondary | ICD-10-CM

## 2013-10-22 DIAGNOSIS — I5032 Chronic diastolic (congestive) heart failure: Secondary | ICD-10-CM | POA: Insufficient documentation

## 2013-10-22 MED ORDER — HYDRALAZINE HCL 25 MG PO TABS: 25.0000 mg | ORAL_TABLET | Freq: Three times a day (TID) | ORAL | Status: DC

## 2013-10-22 NOTE — Patient Instructions (Signed)
Your physician has recommended you make the following change in your medication:   START TAKING HYDRALAZINE 25 [place] physician wants you to follow-up in: [place] will receive a reminder letter in the mail two months in advance. If you don't receive a letter, please call our office to schedule the follow-up appointment.

## 2013-10-22 NOTE — Progress Notes (Signed)
Patient ID: Samantha [name], female   DOB: [birth year], 73 y.o.   MRN: [id number]    Date:  10/22/2013   ID:  Samantha [name], Samantha [name] 26-Dec-1940, MRN [id number]  PCP:  Samantha November, MD  Cardiologist:  Samantha [name] (Dr. Jenell Milliner )    History of Present Illness: Samantha [name] is a 73 y.o. female who returns for f/u  She has a hx of DM2, HTN, HL, fibromyalgia, palpitations, LE edema. Nuclear study 11/08: Normal. Echocardiogram 3/14: Mild LVH, EF 75-10%, grade 2 diastolic dysfunction.  Admitted 05/2012 for RUE symptoms suspected to be a possible TIA. Carotid US 5/14:  < 39% bilateral ICA stenosis. MRI demonstrated no acute infarct. She had no significant disease on MRA. She was seen again in 7/14 for suspected vertigo/dizziness.   She went to the emergency room in June with chest discomfort and palpitations. Chest x-ray was unremarkable. Cardiac enzymes were also normal. She has had palpitations for years. Holter monitor x 24 hours 07/2012: NSR with PVCs.  She did have symptoms while wearing the Holter monitor.  I saw her in follow up 09/16/12.  Her BP was elevated. Beta blocker was adjusted. Lexiscan Myoview 09/24/12 was normal.  10/22/2013 - She is feeling better. She continues to have palpitations, checks her HR and at most 94 BPM. Chest pain on moderate exertion. Resolves at rest, stable for the last couple of years.  The patient denies chest pain, significant shortness of breath, syncope, orthopnea, PND or significant pedal edema.     Wt Readings from Last 3 Encounters:  10/22/13 189 lb (85.73 kg)  10/06/13 187 lb (84.823 kg)  09/15/13 185 lb (83.915 kg)     Past Medical History  Diagnosis Date  . OSA (obstructive sleep apnea) 09/2007    dx w/ a sleep study, Rx CPAP, weight loss  . Hyperlipidemia   . Hypertension   . Chest pain, atypical 11/2006    saw cards: neg/normal Holter, ECHo and myoview, admitted w/ CP 4/09 neg enz.and CT chest had a 9 beat NSVT  . Fibromyalgia   .  Abnormal CT of the chest 2008    follow up CT, April 2009: unchanged. No f/u suggested   . 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 Shingles 11/2009  . Vaginal dysplasia   . Vaginal cancer 1994  . Type II diabetes mellitus   . Heart murmur   . CHF (congestive heart failure)   . Pneumonia     "double" (06/27/2012)  . History of chronic bronchitis   . Asthma   . H/O hiatal hernia   . GERD (gastroesophageal reflux disease)   . Ocular migraine   . TIA (transient ischemic attack) 06/27/2012    "this is my first" (06/27/2012)  . Rheumatoid factor positive   . Osteoarthritis   . Reactive airway disease 01/29/2002    dx of pseudoasthma / vcd in 2005 and nl sprirometry History of dyspnea, 2011,  improved after several medications were changed around Question of COPD, disproved July 06, 2009 with nl pft's        Current Outpatient Prescriptions  Medication Sig Dispense Refill  . albuterol (VENTOLIN HFA) 108 (90 BASE) MCG/ACT inhaler INHALE 2 PUFFS INTO THE LUNGS EVERY 6 (SIX) HOURS AS NEEDED.  18 each  5  . atorvastatin (LIPITOR) 10 MG tablet TAKE 1 TABLET (10 MG TOTAL) BY MOUTH DAILY.  30 tablet  3  . CALCIUM PO Take 1  tablet by mouth daily.        . cetirizine (ZYRTEC) 10 MG tablet Take 1 tablet (10 mg total) by mouth daily.  90 tablet  1  . cholecalciferol (VITAMIN D) 1000 UNITS tablet Take 1,000 Units by mouth daily.      . clopidogrel (PLAVIX) 75 MG tablet Take 1 tablet (75 mg total) by mouth daily with breakfast.  30 tablet  11  . cyclobenzaprine (FLEXERIL) 10 MG tablet Take 1 tablet (10 mg total) by mouth 2 (two) times daily as needed for muscle spasms.  20 tablet  0  . DULoxetine (CYMBALTA) 60 MG capsule Take 1 capsule (60 mg total) by mouth daily.  30 capsule  12  . fluticasone (FLONASE) 50 MCG/ACT nasal spray Place 2 sprays into the nose as needed for rhinitis.      . furosemide (LASIX) 20 MG tablet Take 20 mg by mouth 2 (two) times daily.      [name] lisinopril  (PRINIVIL,ZESTRIL) 40 MG tablet Take 1 tablet (40 mg total) by mouth daily.  30 tablet  6  . LYRICA 100 MG capsule TAKE 1 CAPSULE AT BEDTIME AS NEEDED  30 capsule  1  . metFORMIN (GLUCOPHAGE-XR) 500 MG 24 hr tablet TAKE 1 TABLET (500 MG TOTAL) BY MOUTH DAILY WITH BREAKFAST.  90 tablet  1  . Misc Natural Products (OSTEO BI-FLEX JOINT SHIELD) TABS Take 1 tablet by mouth daily.      . Multiple Vitamin (MULTIVITAMIN) capsule Take 1 capsule by mouth daily.        . Nebivolol HCl 20 MG TABS Take 20 mg by mouth 2 (two) times daily.      Vladimir Faster Glycol-Propyl Glycol (SYSTANE) 0.4-0.3 % GEL Apply 1 drop to eye daily as needed (dry eyes).      . Potassium 99 MG TABS Take 99 mg by mouth daily.       . ranitidine (ZANTAC) 300 MG tablet TAKE 1 TABLET (300 MG TOTAL) BY MOUTH AT BEDTIME.  30 tablet  6   No current facility-administered medications for this visit.    Allergies:    Allergies  Allergen Reactions  . Cefuroxime Axetil     REACTION: urticaria (hives)  . Oxycodone Nausea And Vomiting  . Seldane [Terfenadine]     REACTION: urticaria (hives)  . Zocor [Simvastatin]     2012 "Muscle breakdown " with profuse sweating  . Tramadol     REACTION: vomitting  . Bactrim [Sulfamethoxazole-Tmp Ds]     See OV 09-15-13, rash-tongue swelling due to bactrim ?  . Pravastatin     "muscle breakdown" with profuse sweating    Social History:  The patient  reports that she has quit smoking. Her smoking use included Cigarettes. She smoked 0.00 packs per day. She has never used smokeless tobacco. She reports that she does not drink alcohol or use illicit drugs.   ROS:  Please see the history of present illness.      All other systems reviewed and negative.   PHYSICAL EXAM: VS:  BP 160/80  Pulse 70  Ht 5\' 2"  (1.575 m)  Wt 189 lb (85.73 kg)  BMI 34.56 kg/m2 Well nourished, well developed, in no acute distress HEENT: normal Neck: no JVD Cardiac:  normal S1, S2; RRR; no murmur Lungs:  clear to  auscultation bilaterally, no wheezing, rhonchi or rales Abd: soft, nontender, no hepatomegaly Ext: no edema Skin: warm and dry Neuro:  CNs 2-12 intact, no focal abnormalities noted  EKG:  Sinus bradycardia, HR 59, no acute changes, PR 226   ECHO 04/17/2013 Left ventricle: The cavity size was normal. Wall thickness was increased in a pattern of mild LVH. Systolic function was normal. The estimated ejection fraction was in the range of 55% to 65%. Wall motion was normal; there were no regional wall motion abnormalities. Features are consistent with a pseudonormal left ventricular filling pattern, with concomitant abnormal relaxation and increased filling pressure (grade 2 diastolic dysfunction).     ASSESSMENT AND PLAN:  1. PVCs:  Symptomatically improved.  Continue current Rx.  2. Chest Pain:  Low risk Myoview.  No further cardiac workup.  3. Hypertension:  UnControlled with mild LVH and grade 2 DD on echo. We will add hydralazine 25 mg po TID. 4. Chronic diastolic CHF - well compensated. Crea stable. 5. Hyperlipidemia: at goal.  Continue statin. 6. Hx of TIA:  She remains on Plavix and has f/u with neurology.   7. Disposition: Follow up with Dr. Ena Dawley or me in 1 year.  Signed, Richardson Dopp, PA-C  10/22/2013 9:44 AM

## 2013-11-05 ENCOUNTER — Ambulatory Visit (INDEPENDENT_AMBULATORY_CARE_PROVIDER_SITE_OTHER): Payer: Medicare Other | Admitting: Neurology

## 2013-11-05 ENCOUNTER — Encounter: Payer: Self-pay | Admitting: Neurology

## 2013-11-05 VITALS — Ht 62.0 in | Wt 190.0 lb

## 2013-11-05 DIAGNOSIS — R269 Unspecified abnormalities of gait and mobility: Secondary | ICD-10-CM

## 2013-11-05 DIAGNOSIS — M542 Cervicalgia: Secondary | ICD-10-CM
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
## 2013-11-09 ENCOUNTER — Ambulatory Visit: Payer: Medicare Other | Attending: Neurology

## 2013-11-09 ENCOUNTER — Other Ambulatory Visit: Payer: Self-pay

## 2013-11-09 DIAGNOSIS — M25519 Pain in unspecified shoulder: Secondary | ICD-10-CM | POA: Diagnosis not present

## 2013-11-09 DIAGNOSIS — M2550 Pain in unspecified joint: Secondary | ICD-10-CM | POA: Insufficient documentation

## 2013-11-09 DIAGNOSIS — R262 Difficulty in walking, not elsewhere classified: Secondary | ICD-10-CM | POA: Insufficient documentation

## 2013-11-09 DIAGNOSIS — M542 Cervicalgia: Secondary | ICD-10-CM | POA: Diagnosis not present

## 2013-11-09 DIAGNOSIS — M6281 Muscle weakness (generalized): Secondary | ICD-10-CM | POA: Diagnosis not present

## 2013-11-09 DIAGNOSIS — R269 Unspecified abnormalities of gait and mobility: Secondary | ICD-10-CM | POA: Insufficient documentation

## 2013-11-09 MED ORDER — LISINOPRIL 40 MG PO TABS: 40.0000 mg | ORAL_TABLET | Freq: Every day | ORAL | Status: DC

## 2013-11-11 ENCOUNTER — Ambulatory Visit: Payer: Medicare Other

## 2013-11-11 DIAGNOSIS — R269 Unspecified abnormalities of gait and mobility: Secondary | ICD-10-CM | POA: Diagnosis not present

## 2013-11-12 ENCOUNTER — Other Ambulatory Visit: Payer: Self-pay

## 2013-11-12 MED ORDER — RANITIDINE HCL 300 MG PO TABS: ORAL_TABLET | ORAL | Status: DC

## 2013-11-12 MED ORDER — ATORVASTATIN CALCIUM 10 MG PO TABS: ORAL_TABLET | ORAL | Status: DC

## 2013-11-12 MED ORDER — CLOPIDOGREL BISULFATE 75 MG PO TABS: 75.0000 mg | ORAL_TABLET | Freq: Every day | ORAL | Status: DC

## 2013-11-12 MED ORDER — LISINOPRIL 40 MG PO TABS: 40.0000 mg | ORAL_TABLET | Freq: Every day | ORAL | Status: DC

## 2013-11-12 NOTE — Telephone Encounter (Signed)
Pharmacy requests 90 day Rx  

## 2013-11-13 ENCOUNTER — Encounter: Payer: Self-pay | Admitting: Internal Medicine

## 2013-11-13 ENCOUNTER — Ambulatory Visit (INDEPENDENT_AMBULATORY_CARE_PROVIDER_SITE_OTHER): Payer: Medicare Other | Admitting: Internal Medicine

## 2013-11-13 VITALS — BP 145/80 | HR 75 | Temp 98.0°F | Wt 189.0 lb

## 2013-11-13 DIAGNOSIS — Z23 Encounter for immunization: Secondary | ICD-10-CM | POA: Diagnosis not present

## 2013-11-13 DIAGNOSIS — E119 Type 2 diabetes mellitus without complications: Secondary | ICD-10-CM

## 2013-11-13 DIAGNOSIS — M797 Fibromyalgia: Secondary | ICD-10-CM

## 2013-11-13 DIAGNOSIS — I10 Essential (primary) hypertension: Secondary | ICD-10-CM

## 2013-11-13 DIAGNOSIS — R269 Unspecified abnormalities of gait and mobility: Secondary | ICD-10-CM | POA: Diagnosis not present

## 2013-11-13 LAB — BASIC METABOLIC PANEL
BUN: 21 mg/dL (ref 6–23)
CO2: 29 mEq/L (ref 19–32)
Calcium: 9.1 mg/dL (ref 8.4–10.5)
Chloride: 103 mEq/L (ref 96–112)
Creatinine, Ser: 1 mg/dL (ref 0.4–1.2)
GFR: 71.01 mL/min (ref >60.00)
Glucose, Bld: 111 mg/dL — ABNORMAL HIGH (ref 70–99)
POTASSIUM: 3.5 meq/L (ref 3.5–5.1)
SODIUM: 139 meq/L (ref 135–145)

## 2013-11-13 LAB — HEMOGLOBIN A1C: HEMOGLOBIN A1C: 6.4 % (ref 4.6–6.5)

## 2013-11-13 MED ORDER — CYCLOBENZAPRINE HCL 10 MG PO TABS: 10.0000 mg | ORAL_TABLET | Freq: Two times a day (BID) | ORAL | Status: DC | PRN

## 2013-11-13 MED ORDER — PREGABALIN 100 MG PO CAPS: ORAL_CAPSULE | ORAL | Status: DC

## 2013-11-13 NOTE — Assessment & Plan Note (Signed)
Saw cardiology last month, hydralazine was added, BP now better at home, usually less than 145/80. No apparent side effects. Plan: Continue with present care, check a BMP

## 2013-11-13 NOTE — Assessment & Plan Note (Addendum)
Refilling  Lyrica Flexeril today

## 2013-11-13 NOTE — Patient Instructions (Signed)
Get your blood work before you leave    Check the  blood pressure 2 or 3 times a week   Be sure your blood pressure is between  145/85 and 110/65.  if it is consistently higher or lower, let me know   Please come back to the office by 04-2014 for a physical exam. Come back fasting      Diabetes and Foot Care Diabetes may cause you to have problems because of poor blood supply (circulation) to your feet and legs. This may cause the skin on your feet to become thinner, break easier, and heal more slowly. Your skin may become dry, and the skin may peel and crack. You may also have nerve damage in your legs and feet causing decreased feeling in them. You may not notice minor injuries to your feet that could lead to infections or more serious problems. Taking care of your feet is one of the most important things you can do for yourself.  HOME CARE INSTRUCTIONS  Wear shoes at all times, even in the house. Do not go barefoot. Bare feet are easily injured.  Check your feet daily for blisters, cuts, and redness. If you cannot see the bottom of your feet, use a mirror or ask someone for help.  Wash your feet with warm water (do not use hot water) and mild soap. Then pat your feet and the areas between your toes until they are completely dry. Do not soak your feet as this can dry your skin.  Apply a moisturizing lotion or petroleum jelly (that does not contain alcohol and is unscented) to the skin on your feet and to dry, brittle toenails. Do not apply lotion between your toes.  Trim your toenails straight across. Do not dig under them or around the cuticle. File the edges of your nails with an emery board or nail file.  Do not cut corns or calluses or try to remove them with medicine.  Wear clean socks or stockings every day. Make sure they are not too tight. Do not wear knee-high stockings since they may decrease blood flow to your legs.  Wear shoes that fit properly and have enough cushioning.  To break in new shoes, wear them for just a few hours a day. This prevents you from injuring your feet. Always look in your shoes before you put them on to be sure there are no objects inside.  Do not cross your legs. This may decrease the blood flow to your feet.  If you find a minor scrape, cut, or break in the skin on your feet, keep it and the skin around it clean and dry. These areas may be cleansed with mild soap and water. Do not cleanse the area with peroxide, alcohol, or iodine.  When you remove an adhesive bandage, be sure not to damage the skin around it.  If you have a wound, look at it several times a day to make sure it is healing.  Do not use heating pads or hot water bottles. They may burn your skin. If you have lost feeling in your feet or legs, you may not know it is happening until it is too late.  Make sure your health care provider performs a complete foot exam at least annually or more often if you have foot problems. Report any cuts, sores, or bruises to your health care provider immediately. SEEK MEDICAL CARE IF:   You have an injury that is not healing.  You have  cuts or breaks in the skin.  You have an ingrown nail.  You notice redness on your legs or feet.  You feel burning or tingling in your legs or feet.  You have pain or cramps in your legs and feet.  Your legs or feet are numb.  Your feet always feel cold. SEEK IMMEDIATE MEDICAL CARE IF:   There is increasing redness, swelling, or pain in or around a wound.  There is a red line that goes up your leg.  Pus is coming from a wound.  You develop a fever or as directed by your health care provider.  You notice a bad smell coming from an ulcer or wound. Document Released: 01/13/2000 Document Revised: 09/17/2012 Document Reviewed: 06/24/2012 [facility] Patient Information 2015 [place], [place]. This information is not intended to replace advice given to you by your health care provider. Make sure you  discuss any questions you have with your health care provider.

## 2013-11-13 NOTE — Progress Notes (Signed)
Subjective:    Patient ID: Samantha [name], female    DOB: [birth year], 73 y.o.   MRN: [id number]  DOS:  11/13/2013 Type of visit - description :  Interval history: Since her last office visit, saw neurology, cardiology, had the EMG study, records reviewed. Hypertension, hydralazine was added to her regimen, BPs have improved. Diabetes good medication compliance, ambulatory blood sugars around 100 On Lipitor, good  compliance, labs reviewed, she's at goal Continue with balance/gait problems, doing physical therapy.   ROS Denies chest pain or difficulty breathing No nausea, vomiting, diarrhea  Past Medical History  Diagnosis Date  . OSA (obstructive sleep apnea) 09/2007    dx w/ a sleep study, Rx CPAP, weight loss  . Hyperlipidemia   . Hypertension   . Chest pain, atypical 11/2006    saw cards: neg/normal Holter, ECHo and myoview, admitted w/ CP 4/09 neg enz.and CT chest had a 9 beat NSVT  . Fibromyalgia   . Abnormal CT of the chest 2008    follow up CT, April 2009: unchanged. No f/u suggested   . 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 Shingles 11/2009  . Vaginal dysplasia   . Vaginal cancer 1994  . Type II diabetes mellitus   . Heart murmur   . CHF (congestive heart failure)   . Pneumonia     "double" (06/27/2012)  . History of chronic bronchitis   . Asthma   . H/O hiatal hernia   . GERD (gastroesophageal reflux disease)   . Ocular migraine   . TIA (transient ischemic attack) 06/27/2012    "this is my first" (06/27/2012)  . Rheumatoid factor positive   . Osteoarthritis   . Reactive airway disease 01/29/2002    dx of pseudoasthma / vcd in 2005 and nl sprirometry History of dyspnea, 2011,  improved after several medications were changed around Question of COPD, disproved July 06, 2009 with nl pft's        Past Surgical History  Procedure Laterality Date  . Thyroidectomy, partial  1960's  . Bunionectomy Left ~ 1977  . Breast biopsy Right 1999  .  Anterior cervical decomp/discectomy fusion  2001  . Cataract extraction w/ intraocular lens  implant, bilateral  2012  . Vaginal mass excision  1994    "Laser surgery for vaginal cancer; followed by chemotherapy" (06/27/2012)  . Abdominal hysterectomy  1980    History   Social History  . Marital Status: Married    Spouse Name: Ilona Sorrel    Number of Children: 2  . Years of Education: masters   Occupational History  . Retired, disable since 2000    Social History Main Topics  . Smoking status: Former Smoker    Types: Cigarettes  . Smokeless tobacco: Never Used     Comment: Quit in 2001  . Alcohol Use: No  . Drug Use: No  . Sexual Activity: No   Other Topics Concern  . Not on file   Social History Narrative   On disability since 2000--- also husband has MS   Education. College.   Right handed.        Medication List       This list is accurate as of: 11/13/13 11:59 PM.  Always use your most recent med list.               albuterol 108 (90 BASE) MCG/ACT inhaler  Commonly known as:  VENTOLIN HFA  INHALE 2 PUFFS  INTO THE LUNGS EVERY 6 (SIX) HOURS AS NEEDED.     atorvastatin 10 MG tablet  Commonly known as:  LIPITOR  TAKE 1 TABLET (10 MG TOTAL) BY MOUTH DAILY.     CALCIUM PO  Take 1 tablet by mouth daily.     cetirizine 10 MG tablet  Commonly known as:  ZYRTEC  Take 1 tablet (10 mg total) by mouth daily.     cholecalciferol 1000 UNITS tablet  Commonly known as:  VITAMIN D  Take 1,000 Units by mouth daily.     clopidogrel 75 MG tablet  Commonly known as:  PLAVIX  Take 1 tablet (75 mg total) by mouth daily with breakfast.     cyclobenzaprine 10 MG tablet  Commonly known as:  FLEXERIL  Take 1 tablet (10 mg total) by mouth 2 (two) times daily as needed for muscle spasms.     DULoxetine 60 MG capsule  Commonly known as:  CYMBALTA  Take 1 capsule (60 mg total) by mouth daily.     fluticasone 50 MCG/ACT nasal spray  Commonly known as:  FLONASE  Place 2  sprays into the nose as needed for rhinitis.     furosemide 20 MG tablet  Commonly known as:  LASIX  Take 20 mg by mouth 2 (two) times daily.     hydrALAZINE 25 MG tablet  Commonly known as:  APRESOLINE  Take 1 tablet (25 mg total) by mouth 3 (three) times daily.     lisinopril 40 MG tablet  Commonly known as:  PRINIVIL,ZESTRIL  Take 1 tablet (40 mg total) by mouth daily.     metFORMIN 500 MG 24 hr tablet  Commonly known as:  GLUCOPHAGE-XR  TAKE 1 TABLET (500 MG TOTAL) BY MOUTH DAILY WITH BREAKFAST.     multivitamin capsule  Take 1 capsule by mouth daily.     Nebivolol HCl 20 MG Tabs  Take 20 mg by mouth 2 (two) times daily.     OSTEO BI-FLEX JOINT SHIELD Tabs  Take 1 tablet by mouth daily.     Potassium 99 MG Tabs  Take 99 mg by mouth daily.     pregabalin 100 MG capsule  Commonly known as:  LYRICA  TAKE 1 CAPSULE AT BEDTIME AS NEEDED     ranitidine 300 MG tablet  Commonly known as:  ZANTAC  TAKE 1 TABLET (300 MG TOTAL) BY MOUTH AT BEDTIME.     SYSTANE 0.4-0.3 % Gel  Generic drug:  Polyethyl Glycol-Propyl Glycol  Apply 1 drop to eye daily as needed (dry eyes).           Objective:   Physical Exam BP 145/80  Pulse 75  Temp(Src) 98 F (36.7 C) (Oral)  Wt 189 lb (85.73 kg)  SpO2 99%  General -- alert, well-developed, NAD.  Lungs -- normal respiratory effort, no intercostal retractions, no accessory muscle use, and normal breath sounds.  Heart-- normal rate, regular rhythm, no murmur.  DIABETIC FEET EXAM: No lower extremity edema Normal pedal pulses bilaterally Skin normal, nails normal, + toe deformities and few  calluses Pinprick examination of the feet -- mild patchy decrease L Neurologic--  alert & oriented X3.  Psych-- Cognition and judgment appear intact. Cooperative with normal attention span and concentration. No anxious or depressed appearing.        Assessment & Plan:  Flu shot discussed, high   versus regular, elected regular  dose.

## 2013-11-13 NOTE — Progress Notes (Signed)
Pre visit review using our clinic review tool, if applicable. No additional management support is needed unless otherwise documented below in the visit note. 

## 2013-11-13 NOTE — Assessment & Plan Note (Signed)
Feet exam today showed mild neuropathy, feet care discussed Recommend to see eye doctor, she is due. Check the A1c continue with metformin

## 2013-11-13 NOTE — Assessment & Plan Note (Signed)
Currently undergoing physical therapy

## 2013-11-14 ENCOUNTER — Ambulatory Visit
Admission: RE | Admit: 2013-11-14 | Discharge: 2013-11-14 | Disposition: A | Discharge status: Home / Self Care | Payer: Medicare Other | Source: Ambulatory Visit | Attending: Neurology | Admitting: Neurology

## 2013-11-14 DIAGNOSIS — R269 Unspecified abnormalities of gait and mobility: Secondary | ICD-10-CM

## 2013-11-14 DIAGNOSIS — M542 Cervicalgia: Secondary | ICD-10-CM | POA: Diagnosis not present

## 2013-11-16 ENCOUNTER — Ambulatory Visit: Payer: Medicare Other

## 2013-11-16 DIAGNOSIS — R269 Unspecified abnormalities of gait and mobility: Secondary | ICD-10-CM | POA: Diagnosis not present

## 2013-11-17 ENCOUNTER — Ambulatory Visit: Payer: Medicare Other

## 2013-11-17 DIAGNOSIS — R269 Unspecified abnormalities of gait and mobility: Secondary | ICD-10-CM | POA: Diagnosis not present

## 2013-11-18 ENCOUNTER — Telehealth: Payer: Self-pay | Admitting: Neurology

## 2013-11-18 NOTE — Telephone Encounter (Signed)
I have called left message, there was no acute changes on MRI cervical, keep follow up in Dec 8th, call back for questions.   MRI scan of cervical spine showing stable postoperative changes of anterior cervical fusion from C3-C6 with broad-based disc osteophyte protrusion at C6-7 resulting in mild canal and bilateral foraminal narrowing.

## 2013-11-24 ENCOUNTER — Ambulatory Visit: Payer: Medicare Other

## 2013-11-24 DIAGNOSIS — R269 Unspecified abnormalities of gait and mobility: Secondary | ICD-10-CM | POA: Diagnosis not present

## 2013-11-25 ENCOUNTER — Ambulatory Visit: Payer: Medicare Other

## 2013-11-25 DIAGNOSIS — R269 Unspecified abnormalities of gait and mobility: Secondary | ICD-10-CM | POA: Diagnosis not present

## 2013-12-01 ENCOUNTER — Ambulatory Visit: Payer: Medicare Other | Attending: Neurology

## 2013-12-01 ENCOUNTER — Telehealth: Payer: Self-pay

## 2013-12-01 DIAGNOSIS — R269 Unspecified abnormalities of gait and mobility: Secondary | ICD-10-CM | POA: Insufficient documentation

## 2013-12-01 DIAGNOSIS — M2550 Pain in unspecified joint: Secondary | ICD-10-CM | POA: Insufficient documentation

## 2013-12-01 DIAGNOSIS — M25519 Pain in unspecified shoulder: Secondary | ICD-10-CM | POA: Insufficient documentation

## 2013-12-01 DIAGNOSIS — M542 Cervicalgia: Secondary | ICD-10-CM | POA: Insufficient documentation

## 2013-12-01 DIAGNOSIS — R262 Difficulty in walking, not elsewhere classified: Secondary | ICD-10-CM | POA: Insufficient documentation

## 2013-12-01 DIAGNOSIS — M6281 Muscle weakness (generalized): Secondary | ICD-10-CM | POA: Insufficient documentation

## 2013-12-01 NOTE — Telephone Encounter (Signed)
Pt. No-showed to her 12/01/13 0800 PT appt. Pt called at 0833 to explain to PT that she overslept and missed appt. PT reiterated the importance of arriving to next appt. (12/03/13), 10-15 minutes early. Pt verbalized understanding and agreement.

## 2013-12-03 ENCOUNTER — Emergency Department (HOSPITAL_COMMUNITY)
Admission: EM | Admit: 2013-12-03 | Discharge: 2013-12-03 | Disposition: A | Discharge status: Home / Self Care | Payer: Medicare Other | Attending: Emergency Medicine | Admitting: Emergency Medicine

## 2013-12-03 ENCOUNTER — Emergency Department (HOSPITAL_COMMUNITY): Payer: Medicare Other

## 2013-12-03 ENCOUNTER — Encounter (HOSPITAL_COMMUNITY): Payer: Self-pay | Admitting: Emergency Medicine

## 2013-12-03 ENCOUNTER — Ambulatory Visit: Payer: Medicare Other

## 2013-12-03 VITALS — BP 150/89

## 2013-12-03 DIAGNOSIS — M25672 Stiffness of left ankle, not elsewhere classified: Secondary | ICD-10-CM

## 2013-12-03 DIAGNOSIS — I10 Essential (primary) hypertension: Secondary | ICD-10-CM | POA: Insufficient documentation

## 2013-12-03 DIAGNOSIS — M6281 Muscle weakness (generalized): Secondary | ICD-10-CM

## 2013-12-03 DIAGNOSIS — Z9981 Dependence on supplemental oxygen: Secondary | ICD-10-CM | POA: Diagnosis not present

## 2013-12-03 DIAGNOSIS — M25661 Stiffness of right knee, not elsewhere classified: Secondary | ICD-10-CM

## 2013-12-03 DIAGNOSIS — M542 Cervicalgia: Secondary | ICD-10-CM | POA: Diagnosis not present

## 2013-12-03 DIAGNOSIS — G43909 Migraine, unspecified, not intractable, without status migrainosus: Secondary | ICD-10-CM | POA: Insufficient documentation

## 2013-12-03 DIAGNOSIS — Z87891 Personal history of nicotine dependence: Secondary | ICD-10-CM | POA: Insufficient documentation

## 2013-12-03 DIAGNOSIS — R112 Nausea with vomiting, unspecified: Secondary | ICD-10-CM | POA: Diagnosis not present

## 2013-12-03 DIAGNOSIS — K219 Gastro-esophageal reflux disease without esophagitis: Secondary | ICD-10-CM | POA: Diagnosis not present

## 2013-12-03 DIAGNOSIS — Z7902 Long term (current) use of antithrombotics/antiplatelets: Secondary | ICD-10-CM | POA: Diagnosis not present

## 2013-12-03 DIAGNOSIS — R262 Difficulty in walking, not elsewhere classified: Secondary | ICD-10-CM

## 2013-12-03 DIAGNOSIS — Z79899 Other long term (current) drug therapy: Secondary | ICD-10-CM | POA: Insufficient documentation

## 2013-12-03 DIAGNOSIS — Z86018 Personal history of other benign neoplasm: Secondary | ICD-10-CM | POA: Diagnosis not present

## 2013-12-03 DIAGNOSIS — M25611 Stiffness of right shoulder, not elsewhere classified: Secondary | ICD-10-CM

## 2013-12-03 DIAGNOSIS — R42 Dizziness and giddiness: Secondary | ICD-10-CM

## 2013-12-03 DIAGNOSIS — Z8619 Personal history of other infectious and parasitic diseases: Secondary | ICD-10-CM | POA: Diagnosis not present

## 2013-12-03 DIAGNOSIS — I509 Heart failure, unspecified: Secondary | ICD-10-CM | POA: Diagnosis not present

## 2013-12-03 DIAGNOSIS — M797 Fibromyalgia: Secondary | ICD-10-CM | POA: Insufficient documentation

## 2013-12-03 DIAGNOSIS — H539 Unspecified visual disturbance: Secondary | ICD-10-CM | POA: Diagnosis not present

## 2013-12-03 DIAGNOSIS — G4733 Obstructive sleep apnea (adult) (pediatric): Secondary | ICD-10-CM | POA: Diagnosis not present

## 2013-12-03 DIAGNOSIS — J45909 Unspecified asthma, uncomplicated: Secondary | ICD-10-CM | POA: Insufficient documentation

## 2013-12-03 DIAGNOSIS — R269 Unspecified abnormalities of gait and mobility: Secondary | ICD-10-CM | POA: Diagnosis not present

## 2013-12-03 DIAGNOSIS — R404 Transient alteration of awareness: Secondary | ICD-10-CM | POA: Diagnosis not present

## 2013-12-03 DIAGNOSIS — Z8673 Personal history of transient ischemic attack (TIA), and cerebral infarction without residual deficits: Secondary | ICD-10-CM | POA: Insufficient documentation

## 2013-12-03 DIAGNOSIS — R011 Cardiac murmur, unspecified: Secondary | ICD-10-CM | POA: Insufficient documentation

## 2013-12-03 DIAGNOSIS — E119 Type 2 diabetes mellitus without complications: Secondary | ICD-10-CM | POA: Diagnosis not present

## 2013-12-03 DIAGNOSIS — M2550 Pain in unspecified joint: Secondary | ICD-10-CM | POA: Diagnosis not present

## 2013-12-03 DIAGNOSIS — Z7951 Long term (current) use of inhaled steroids: Secondary | ICD-10-CM | POA: Diagnosis not present

## 2013-12-03 DIAGNOSIS — E785 Hyperlipidemia, unspecified: Secondary | ICD-10-CM | POA: Insufficient documentation

## 2013-12-03 DIAGNOSIS — Z8544 Personal history of malignant neoplasm of other female genital organs: Secondary | ICD-10-CM | POA: Insufficient documentation

## 2013-12-03 DIAGNOSIS — M25519 Pain in unspecified shoulder: Secondary | ICD-10-CM | POA: Diagnosis not present

## 2013-12-03 DIAGNOSIS — R531 Weakness: Secondary | ICD-10-CM | POA: Diagnosis not present

## 2013-12-03 DIAGNOSIS — Z8701 Personal history of pneumonia (recurrent): Secondary | ICD-10-CM | POA: Diagnosis not present

## 2013-12-03 LAB — CBC WITH DIFFERENTIAL/PLATELET
BASOS PCT: 0 % (ref 0–1)
Basophils Absolute: 0 10*3/uL (ref 0.0–0.1)
EOS ABS: 0.1 10*3/uL (ref 0.0–0.7)
Eosinophils Relative: 1 % (ref 0–5)
HEMATOCRIT: 36.3 % (ref 36.0–46.0)
Hemoglobin: 12.3 g/dL (ref 12.0–15.0)
LYMPHS PCT: 18 % (ref 12–46)
Lymphs Abs: 1.2 10*3/uL (ref 0.7–4.0)
MCH: 28.1 pg (ref 26.0–34.0)
MCHC: 33.9 g/dL (ref 30.0–36.0)
MCV: 82.9 fL (ref 78.0–100.0)
Monocytes Absolute: 0.3 10*3/uL (ref 0.1–1.0)
Monocytes Relative: 5 % (ref 3–12)
Neutro Abs: 5.1 10*3/uL (ref 1.7–7.7)
Neutrophils Relative %: 76 % (ref 43–77)
PLATELETS: 147 10*3/uL — AB (ref 150–400)
RBC: 4.38 MIL/uL (ref 3.87–5.11)
RDW: 14.7 % (ref 11.5–15.5)
WBC: 6.6 10*3/uL (ref 4.0–10.5)

## 2013-12-03 LAB — LIPASE, BLOOD: Lipase: 31 U/L (ref 11–59)

## 2013-12-03 LAB — COMPREHENSIVE METABOLIC PANEL
ALBUMIN: 3.8 g/dL (ref 3.5–5.2)
ALK PHOS: 63 U/L (ref 39–117)
ALT: 20 U/L (ref 0–35)
AST: 22 U/L (ref 0–37)
Anion gap: 12 (ref 5–15)
BUN: 21 mg/dL (ref 6–23)
CHLORIDE: 100 meq/L (ref 96–112)
CO2: 27 meq/L (ref 19–32)
CREATININE: 0.81 mg/dL (ref 0.50–1.10)
Calcium: 9.2 mg/dL (ref 8.4–10.5)
GFR calc Af Amer: 83 mL/min — ABNORMAL LOW (ref >90)
GFR calc non Af Amer: 71 mL/min — ABNORMAL LOW (ref >90)
Glucose, Bld: 120 mg/dL — ABNORMAL HIGH (ref 70–99)
POTASSIUM: 4 meq/L (ref 3.7–5.3)
Sodium: 139 mEq/L (ref 137–147)
Total Bilirubin: 0.3 mg/dL (ref 0.3–1.2)
Total Protein: 7.2 g/dL (ref 6.0–8.3)

## 2013-12-03 MED ORDER — ONDANSETRON 4 MG PO TBDP: 4.0000 mg | ORAL_TABLET | Freq: Three times a day (TID) | ORAL | Status: DC | PRN

## 2013-12-03 MED ORDER — ONDANSETRON 4 MG PO TBDP
4.0000 mg | ORAL_TABLET | Freq: Once | ORAL | Status: AC
  Administered 2013-12-03: 4 mg via ORAL
  Filled 2013-12-03: qty 1

## 2013-12-03 NOTE — ED Notes (Signed)
Patient is alert and orientedx4.  Patient was explained discharge instructions and they understood them with no questions.  The patient's sister is taking the patient home.

## 2013-12-03 NOTE — ED Notes (Addendum)
Pt ate dinner, drove grandson home, started feeling dizzy and nauseated and vomited in parking lot

## 2013-12-03 NOTE — Therapy (Signed)
Physical Therapy Treatment  Patient Details  Name: Samantha [name] MRN: [id number] Date of Birth: 12-06-1940  Encounter Date: 12/03/2013      PT End of Session - 12/03/13 1041    Visit Number 7   Number of Visits 17   PT Start Time 0942   PT Stop Time 1016   PT Time Calculation (min) 34 min   Equipment Utilized During Treatment Gait belt   Activity Tolerance Patient tolerated treatment well      Past Medical History  Diagnosis Date  . OSA (obstructive sleep apnea) 09/2007    dx w/ a sleep study, Rx CPAP, weight loss  . Hyperlipidemia   . Hypertension   . Chest pain, atypical 11/2006    saw cards: neg/normal Holter, ECHo and myoview, admitted w/ CP 4/09 neg enz.and CT chest had a 9 beat NSVT  . Fibromyalgia   . Abnormal CT of the chest 2008    follow up CT, April 2009: unchanged. No f/u suggested   . 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 Shingles 11/2009  . Vaginal dysplasia   . Vaginal cancer 1994  . Type II diabetes mellitus   . Heart murmur   . CHF (congestive heart failure)   . Pneumonia     "double" (06/27/2012)  . History of chronic bronchitis   . Asthma   . H/O hiatal hernia   . GERD (gastroesophageal reflux disease)   . Ocular migraine   . TIA (transient ischemic attack) 06/27/2012    "this is my first" (06/27/2012)  . Rheumatoid factor positive   . Osteoarthritis   . Reactive airway disease 01/29/2002    dx of pseudoasthma / vcd in 2005 and nl sprirometry History of dyspnea, 2011,  improved after several medications were changed around Question of COPD, disproved July 06, 2009 with nl pft's        Past Surgical History  Procedure Laterality Date  . Thyroidectomy, partial  1960's  . Bunionectomy Left ~ 1977  . Breast biopsy Right 1999  . Anterior cervical decomp/discectomy fusion  2001  . Cataract extraction w/ intraocular lens  implant, bilateral  2012  . Vaginal mass excision  1994    "Laser surgery for vaginal cancer; followed by  chemotherapy" (06/27/2012)  . Abdominal hysterectomy  1980    BP 150/89 mmHg  Visit Diagnosis:  Difficulty walking  Muscle weakness (generalized)  Stiffness of joint, shoulder region, right  Stiffness of joint, lower leg, right  Stiffness of joint, ankle and foot, left          [facility] Adult PT Treatment/Exercise - 12/03/13 0956    Ambulation/Gait   Ambulation/Gait Yes   Ambulation/Gait Assistance 4: Min guard  VC's to look straight ahead and to improve stride length.   Ambulation Distance (Feet) 730 Feet  500' outdoors, 230'x2 indoors with head turns and ball toss.   Assistive device None   Gait Pattern Step-through pattern;Decreased stride length;Antalgic  B genu valgus   Gait velocity Not measured, but pt noted to amb. at slower pace today due to B knee pain.   Exercises   Exercises Shoulder   Shoulder Exercises: Standing   Other Standing Exercises Standing pec stretch 3x30 sec. holds.VC's for technique.          Education - 12/03/13 1040    Education provided Yes   Education Details Discussed the importance of moving and performing HEP, to decrease pain.   Education Details  Patient   Methods Explanation   Comprehension Verbalized understanding          PT Short Term Goals - 12/03/13 1046    PT SHORT TERM GOAL #1   Title Pt will be able to perform HEP independenly to improve safety and decreased risk for falls.   Time --  12/07/13   Status On-going   PT SHORT TERM GOAL #2   Title Pt improve BERG balance score to >/=4 points to decrease falls risk.   Period --  12/07/13   Status On-going   PT SHORT TERM GOAL #3   Title Pt will be able to improve DGI score to >/=17, to decrease risk of falls.   Period --  12/07/13   Status On-going   PT SHORT TERM GOAL #4   Title Pt will be able to ambulate 300' over even/uneven terrain with LRAD and supervision, to decrease risk of falls.   Time --  12/07/13   Status On-going          PT Long Term Goals -  12/03/13 1048    PT LONG TERM GOAL #1   Title Pt will be able to verbalize understanding of fall prevention strategies within home environment.   Period --  01/04/14   Status On-going   PT LONG TERM GOAL #2   Title Pt will verbalize plans to continue fitness by joining a community fitness center upon D/C.   Time --  01/04/14   Status On-going   PT LONG TERM GOAL #3   Title Pt will improve BERG balance score by >/=8 points to decrease falls risk.   Period --  01/04/14   Status On-going   PT LONG TERM GOAL #4   Title Pt will be able to improve DGI score to >/=20, to decrease risk of falls.   Time --  01/04/14   Status On-going   PT LONG TERM GOAL #5   Title Pt will be able to ambulate 500' over even/uneven terrain with LRAD at MOD I level, to decrease risk of falls.   Time --  01/04/14   Status On-going   Additional Long Term Goals   Additional Long Term Goals Yes   PT LONG TERM GOAL #6   Title Pt will improve ABC scale score by >/= 18% to improve confidence in balance while performing ADLs.   Time --  01/04/14   Status On-going          Plan - 12/03/13 1044    Clinical Impression Statement Pt limited today by B knee pain, as she required frequent rest breaks. However, pt reported pain decreased to 3/10 after amb., at end of session. Pt also experiences decr. gait speed while performing dynamic activities, and would continue to benefit from skilled therapy to improve functional mobility and to decr. pain.   Pt will benefit from skilled therapeutic intervention in order to improve on the following deficits Abnormal gait;Difficulty walking;Decreased balance;Decreased range of motion;Decreased activity tolerance;Decreased strength;Impaired flexibility   Rehab Potential Good   PT Frequency Min 2X/week   PT Duration 8 weeks   PT Treatment/Interventions DME instruction;Stair training;Gait training;Neuromuscular re-education;Balance training;Modalities;Patient/family education;Functional  mobility training;Therapeutic activities;Therapeutic exercise;Manual techniques   PT Plan Begin assessing STGs. Provide pec stretch to pt.        Problem List Patient Active Problem List   Diagnosis Date Noted  . Chronic diastolic CHF (congestive heart failure), NYHA class 2 10/22/2013  . Gait difficulty 10/06/2013  . Pain in joint,  shoulder region 11/27/2012  . VBI (vertebrobasilar insufficiency) 08/22/2012  . Right knee pain 07/29/2012  . TIA (transient ischemic attack) 06/27/2012  . Edema 02/01/2012  . DJD (degenerative joint disease) 02/02/2011  . Annual physical exam 06/06/2010  . 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 Postherpetic neuralgia ? 01/02/2010  . NECK PAIN 10/05/2009  . DYSPNEA ON EXERTION 12/06/2008  . Palpitations 11/23/2008  . UTI'S, RECURRENT 09/28/2008  . Fibromyalgia 08/15/2007  . FATIGUE 11/19/2006  . 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 HYPERLIPIDEMIA 05/21/2006  . HTN (hypertension) 05/21/2006  . Reactive airway disease 01/29/2002                                            Alyannah Sanks L 12/03/2013, 10:56 AM

## 2013-12-03 NOTE — ED Provider Notes (Signed)
CSN: [id number]     Arrival date & time 12/03/13  1920 History   First MD Initiated Contact with Patient 12/03/13 1952     Chief Complaint  Patient presents with  . Dizziness     (Consider location/radiation/quality/duration/timing/severity/associated sxs/prior Treatment) Patient is a 73 y.o. female presenting with dizziness. The history is provided by the patient and the EMS personnel.  Dizziness Associated symptoms: nausea and vomiting   Associated symptoms: no chest pain, no diarrhea, no headaches and no shortness of breath   patient was feeling fine yesterday. Patient after dinner suddenly got dizzy nausea and vomiting and diaphoresis. Vomited about 4 times. No blood in it. No diarrhea. Patient stated that there was some blurring of her vision no weakness or numbness focally. No room spinning.no chest pain no shortness of breath. Patient has had a history of TIAs in the past. Not similar to this.  Past Medical History  Diagnosis Date  . OSA (obstructive sleep apnea) 09/2007    dx w/ a sleep study, Rx CPAP, weight loss  . Hyperlipidemia   . Hypertension   . Chest pain, atypical 11/2006    saw cards: neg/normal Holter, ECHo and myoview, admitted w/ CP 4/09 neg enz.and CT chest had a 9 beat NSVT  . Fibromyalgia   . Abnormal CT of the chest 2008    follow up CT, April 2009: unchanged. No f/u suggested   . 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 Shingles 11/2009  . Vaginal dysplasia   . Vaginal cancer 1994  . Type II diabetes mellitus   . Heart murmur   . CHF (congestive heart failure)   . Pneumonia     "double" (06/27/2012)  . History of chronic bronchitis   . Asthma   . H/O hiatal hernia   . GERD (gastroesophageal reflux disease)   . Ocular migraine   . TIA (transient ischemic attack) 06/27/2012    "this is my first" (06/27/2012)  . Rheumatoid factor positive   . Osteoarthritis   . Reactive airway disease 01/29/2002    dx of pseudoasthma / vcd in 2005 and nl  sprirometry History of dyspnea, 2011,  improved after several medications were changed around Question of COPD, disproved July 06, 2009 with nl pft's      . TIA (transient ischemic attack)     x2   Past Surgical History  Procedure Laterality Date  . Thyroidectomy, partial  1960's  . Bunionectomy Left ~ 1977  . Breast biopsy Right 1999  . Anterior cervical decomp/discectomy fusion  2001  . Cataract extraction w/ intraocular lens  implant, bilateral  2012  . Vaginal mass excision  1994    "Laser surgery for vaginal cancer; followed by chemotherapy" (06/27/2012)  . Abdominal hysterectomy  1980   Family History  Problem Relation Age of Onset  . Allergies Sister   . Parkinsonism Sister     possible  . Asthma Sister   . Asthma Paternal Grandmother   . Heart disease Father   . Heart disease Mother   . Lung cancer Mother   . Heart disease      paternal grandparents, maternal grandparents,   . Heart disease Brother   . Emphysema Brother   . Aneurysm Brother     x3  . Kidney failure Brother   . Diabetes Brother   . Breast cancer Neg Hx   . Colon cancer Neg Hx   . Diabetes Brother  History  Substance Use Topics  . Smoking status: Former Smoker    Types: Cigarettes  . Smokeless tobacco: Never Used     Comment: Quit in 2001  . Alcohol Use: No   OB History    Gravida Para Term Preterm AB TAB SAB Ectopic Multiple Living   2              Review of Systems  Constitutional: Positive for diaphoresis. Negative for fever.  HENT: Negative for congestion.   Eyes: Positive for visual disturbance.  Respiratory: Negative for shortness of breath.   Cardiovascular: Negative for chest pain.  Gastrointestinal: Positive for nausea and vomiting. Negative for abdominal pain and diarrhea.  Musculoskeletal: Negative for myalgias and back pain.  Skin: Negative for rash.  Neurological: Positive for dizziness and light-headedness. Negative for speech difficulty, weakness, numbness and  headaches.  Hematological: Does not bruise/bleed easily.  Psychiatric/Behavioral: Negative for confusion.      Allergies  Cefuroxime axetil; Oxycodone; Seldane; Zocor; Tramadol; Bactrim; and Pravastatin  Home Medications   Prior to Admission medications   Medication Sig Start Date End Date Taking? Authorizing Provider  albuterol (PROVENTIL HFA;VENTOLIN HFA) 108 (90 BASE) MCG/ACT inhaler Inhale 2 puffs into the lungs every 6 (six) hours as needed for wheezing or shortness of breath.   Yes Historical Provider, MD  atorvastatin (LIPITOR) 10 MG tablet Take 10 mg by mouth daily.   Yes Historical Provider, MD  CALCIUM PO Take 1 tablet by mouth daily.     Yes Historical Provider, MD  cetirizine (ZYRTEC) 10 MG tablet Take 1 tablet (10 mg total) by mouth daily. 12/23/12  Yes Colon Branch, MD  cholecalciferol (VITAMIN D) 1000 UNITS tablet Take 1,000 Units by mouth daily.   Yes Historical Provider, MD  clopidogrel (PLAVIX) 75 MG tablet Take 1 tablet (75 mg total) by mouth daily with breakfast. 11/12/13  Yes Philmore Pali, NP  cyclobenzaprine (FLEXERIL) 10 MG tablet Take 1 tablet (10 mg total) by mouth 2 (two) times daily as needed for muscle spasms. 11/13/13  Yes Colon Branch, MD  DULoxetine (CYMBALTA) 60 MG capsule Take 1 capsule (60 mg total) by mouth daily. 11/27/12  Yes Marcial Pacas, MD  furosemide (LASIX) 20 MG tablet Take 20 mg by mouth 2 (two) times daily.   Yes Historical Provider, MD  hydrALAZINE (APRESOLINE) 25 MG tablet Take 1 tablet (25 mg total) by mouth 3 (three) times daily. 10/22/13  Yes [name], MD  lisinopril (PRINIVIL,ZESTRIL) 40 MG tablet Take 1 tablet (40 mg total) by mouth daily. 11/12/13  Yes Colon Branch, MD  metFORMIN (GLUCOPHAGE-XR) 500 MG 24 hr tablet Take 500 mg by mouth daily with breakfast.   Yes Historical Provider, MD  Misc Natural Products (OSTEO BI-FLEX JOINT SHIELD) TABS Take 1 tablet by mouth daily.   Yes Historical Provider, MD  Multiple Vitamin (MULTIVITAMIN)  capsule Take 1 capsule by mouth daily.     Yes Historical Provider, MD  Nebivolol HCl 20 MG TABS Take 20 mg by mouth 2 (two) times daily. 09/16/12  Yes Tamika Shropshire Joylene Draft, PA-C  Polyethyl Glycol-Propyl Glycol (SYSTANE) 0.4-0.3 % GEL Apply 1 drop to eye daily as needed (dry eyes).   Yes Historical Provider, MD  Potassium 99 MG TABS Take 99 mg by mouth daily.    Yes Historical Provider, MD  pregabalin (LYRICA) 100 MG capsule Take 100 mg by mouth at bedtime as needed (pain).   Yes Historical Provider, MD  ranitidine (ZANTAC) 300  MG tablet Take 300 mg by mouth at bedtime.   Yes Historical Provider, MD  albuterol (VENTOLIN HFA) 108 (90 BASE) MCG/ACT inhaler INHALE 2 PUFFS INTO THE LUNGS EVERY 6 (SIX) HOURS AS NEEDED. Patient not taking: Reported on 12/03/2013 10/06/12   Colon Branch, MD  atorvastatin (LIPITOR) 10 MG tablet TAKE 1 TABLET (10 MG TOTAL) BY MOUTH DAILY. Patient not taking: Reported on 12/03/2013 11/12/13   Colon Branch, MD  fluticasone [facility]) 50 MCG/ACT nasal spray Place 2 sprays into the nose as needed for rhinitis.    Historical Provider, MD  metFORMIN (GLUCOPHAGE-XR) 500 MG 24 hr tablet TAKE 1 TABLET (500 MG TOTAL) BY MOUTH DAILY WITH BREAKFAST. Patient not taking: Reported on 12/03/2013    Colon Branch, MD  ondansetron (ZOFRAN ODT) 4 MG disintegrating tablet Take 1 tablet (4 mg total) by mouth every 8 (eight) hours as needed for nausea or vomiting. 12/03/13   Fredia Sorrow, MD  pregabalin (LYRICA) 100 MG capsule TAKE 1 CAPSULE AT BEDTIME AS NEEDED Patient not taking: Reported on 12/03/2013 11/13/13   Colon Branch, MD  ranitidine (ZANTAC) 300 MG tablet TAKE 1 TABLET (300 MG TOTAL) BY MOUTH AT BEDTIME. Patient not taking: Reported on 12/03/2013 11/12/13   Colon Branch, MD   BP 136/88 mmHg  Pulse 80  Temp(Src) 97.3 F (36.3 C) (Oral)  Resp 14  Ht 5' 3.5" (1.613 m)  Wt 187 lb (84.823 kg)  BMI 32.60 kg/m2  SpO2 100% Physical Exam  Constitutional: She is oriented to person, place, and time. She  appears well-developed and well-nourished. No distress.  HENT:  Head: Normocephalic and atraumatic.  Mouth/Throat: Oropharynx is clear and moist.  Eyes: Conjunctivae and EOM are normal. Pupils are equal, round, and reactive to light.  Neck: Normal range of motion. Neck supple.  Cardiovascular: Normal rate, regular rhythm and normal heart sounds.   No murmur heard. Pulmonary/Chest: Effort normal and breath sounds normal. No respiratory distress.  Abdominal: Soft. Bowel sounds are normal. There is no tenderness.  Musculoskeletal: Normal range of motion. She exhibits no edema.  Neurological: She is alert and oriented to person, place, and time. No cranial nerve deficit. She exhibits normal muscle tone. Coordination normal.  Skin: Skin is warm. No rash noted.  Nursing note and vitals reviewed.   ED Course  Procedures (including critical care time) Labs Review Labs Reviewed  COMPREHENSIVE METABOLIC PANEL - Abnormal; Notable for the following:    Glucose, Bld 120 (*)    GFR calc non Af Amer 71 (*)    GFR calc Af Amer 83 (*)    All other components within normal limits  CBC WITH DIFFERENTIAL - Abnormal; Notable for the following:    Platelets 147 (*)    All other components within normal limits  LIPASE, BLOOD  URINALYSIS, ROUTINE W REFLEX MICROSCOPIC   Results for orders placed or performed during the hospital encounter of 12/03/13  Comprehensive metabolic panel  Result Value Ref Range   Sodium 139 137 - 147 mEq/L   Potassium 4.0 3.7 - 5.3 mEq/L   Chloride 100 96 - 112 mEq/L   CO2 27 19 - 32 mEq/L   Glucose, Bld 120 (H) 70 - 99 mg/dL   BUN 21 6 - 23 mg/dL   Creatinine, Ser 0.81 0.50 - 1.10 mg/dL   Calcium 9.2 8.4 - 10.5 mg/dL   Total Protein 7.2 6.0 - 8.3 g/dL   Albumin 3.8 3.5 - 5.2 g/dL   AST 22  0 - 37 U/L   ALT 20 0 - 35 U/L   Alkaline Phosphatase 63 39 - 117 U/L   Total Bilirubin 0.3 0.3 - 1.2 mg/dL   GFR calc non Af Amer 71 (L) >90 mL/min   GFR calc Af Amer 83 (L) >90  mL/min   Anion gap 12 5 - 15  CBC with Differential  Result Value Ref Range   WBC 6.6 4.0 - 10.5 K/uL   RBC 4.38 3.87 - 5.11 MIL/uL   Hemoglobin 12.3 12.0 - 15.0 g/dL   HCT 36.3 36.0 - 46.0 %   MCV 82.9 78.0 - 100.0 fL   MCH 28.1 26.0 - 34.0 pg   MCHC 33.9 30.0 - 36.0 g/dL   RDW 14.7 11.5 - 15.5 %   Platelets 147 (L) 150 - 400 K/uL   Neutrophils Relative % 76 43 - 77 %   Neutro Abs 5.1 1.7 - 7.7 K/uL   Lymphocytes Relative 18 12 - 46 %   Lymphs Abs 1.2 0.7 - 4.0 K/uL   Monocytes Relative 5 3 - 12 %   Monocytes Absolute 0.3 0.1 - 1.0 K/uL   Eosinophils Relative 1 0 - 5 %   Eosinophils Absolute 0.1 0.0 - 0.7 K/uL   Basophils Relative 0 0 - 1 %   Basophils Absolute 0.0 0.0 - 0.1 K/uL  Lipase, blood  Result Value Ref Range   Lipase 31 11 - 59 U/L     Imaging Review Dg Chest 2 View  12/03/2013   CLINICAL DATA:  Dizziness.  EXAM: CHEST  2 VIEW  COMPARISON:  07/24/2012  FINDINGS: Normal heart size and pulmonary vascularity. No focal airspace disease or consolidation in the lungs. No blunting of costophrenic angles. No pneumothorax. Mediastinal contours appear intact. Tortuous aorta. Postoperative changes in the cervical spine. Degenerative changes in the thoracic spine.  IMPRESSION: No active cardiopulmonary disease.   Electronically Signed   By: Lucienne Capers M.D.   On: 12/03/2013 22:42   Ct Head Wo Contrast  12/03/2013   CLINICAL DATA:  Sudden onset dizziness with nausea and vomiting this evening.  EXAM: CT HEAD WITHOUT CONTRAST  TECHNIQUE: Contiguous axial images were obtained from the base of the skull through the vertex without intravenous contrast.  COMPARISON:  08/22/2012  FINDINGS: Mild diffuse cerebral atrophy. Ventricles and sulci are otherwise symmetrical. No mass effect or midline shift. No abnormal extra-axial fluid collections. Gray-white matter junctions are distinct. Basal cisterns are not effaced. No evidence of acute intracranial hemorrhage. No depressed skull  fractures. Visualized paranasal sinuses and mastoid air cells are not opacified. Vascular calcifications.  IMPRESSION: No acute intracranial abnormalities.   Electronically Signed   By: Lucienne Capers M.D.   On: 12/03/2013 22:38     EKG Interpretation   Date/Time:  Thursday December 03 2013 19:27:41 EST Ventricular Rate:  70 PR Interval:  266 QRS Duration: 105 QT Interval:  471 QTC Calculation: 508 R Axis:   -33 Text Interpretation:  Sinus rhythm Prolonged PR interval Left atrial  enlargement Abnormal R-wave progression, late transition Left ventricular  hypertrophy Prolonged QT interval Confirmed by Rogene Houston  MD, Jazmin Vensel  [phone]) on 12/03/2013 7:52:41 PM      MDM   Final diagnoses:  Dizziness  Nausea and vomiting, vomiting of unspecified type    The patient's dizziness not consistent with vertigo. Doubt that this was a TIA. Associated with nausea and vomiting symptoms came on suddenly. Patient feels significantly better now. Patient has been up and  about without any recurrent dizziness. Nausea is controlled. Will discharge home with Zofran. EKG without acute changes head CT without any acute abnormalities. Chest x-ray negative labs without any significant abnormalities.    Fredia Sorrow, MD 12/03/13 2352

## 2013-12-03 NOTE — Discharge Instructions (Signed)
Dizziness  Dizziness means you feel unsteady or lightheaded. You might feel like you are going to pass out (faint). HOME CARE   Drink enough fluids to keep your pee (urine) clear or pale yellow.  Take your medicines exactly as told by your doctor. If you take blood pressure medicine, always stand up slowly from the lying or sitting position. Hold on to something to steady yourself.  If you need to stand in one place for a long time, move your legs often. Tighten and relax your leg muscles.  Have someone stay with you until you feel okay.  Do not drive or use heavy machinery if you feel dizzy.  Do not drink alcohol. GET HELP RIGHT AWAY IF:   You feel dizzy or lightheaded and it gets worse.  You feel sick to your stomach (nauseous), or you throw up (vomit).  You have trouble talking or walking.  You feel weak or have trouble using your arms, hands, or legs.  You cannot think clearly or have trouble forming sentences.  You have chest pain, belly (abdominal) pain, sweating, or you are short of breath.  Your vision changes.  You are bleeding.  You have problems from your medicine that seem to be getting worse. MAKE SURE YOU:   Understand these instructions.  Will watch your condition.  Will get help right away if you are not doing well or get worse. Document Released: 01/04/2011 Document Revised: 04/09/2011 Document Reviewed: 01/04/2011 [facility] Patient Information 2015 [place], [place]. This information is not intended to replace advice given to you by your health care provider. Make sure you discuss any questions you have with your health care provider.   Take Zofran as needed for nausea and vomiting. Return for any new or worse symptoms. Make an appointment to follow-up with her record Dr. CT scan of your head was negative. Labs without significant abnormalities.

## 2013-12-03 NOTE — ED Notes (Signed)
I introduced myself to the patient and her family.  She is comfortable and resting.

## 2013-12-08 ENCOUNTER — Ambulatory Visit: Payer: Medicare Other

## 2013-12-10 ENCOUNTER — Ambulatory Visit: Payer: Medicare Other

## 2013-12-10 DIAGNOSIS — M6281 Muscle weakness (generalized): Secondary | ICD-10-CM

## 2013-12-10 DIAGNOSIS — R262 Difficulty in walking, not elsewhere classified: Secondary | ICD-10-CM

## 2013-12-10 DIAGNOSIS — M25672 Stiffness of left ankle, not elsewhere classified: Secondary | ICD-10-CM

## 2013-12-10 DIAGNOSIS — M25661 Stiffness of right knee, not elsewhere classified: Secondary | ICD-10-CM

## 2013-12-10 DIAGNOSIS — R269 Unspecified abnormalities of gait and mobility: Secondary | ICD-10-CM | POA: Diagnosis not present

## 2013-12-10 DIAGNOSIS — M25611 Stiffness of right shoulder, not elsewhere classified: Secondary | ICD-10-CM

## 2013-12-10 NOTE — Therapy (Signed)
Physical Therapy Treatment  Patient Details  Name: Samantha [name] MRN: [id number] Date of Birth: 1940-08-25  Encounter Date: 12/10/2013      PT End of Session - 12/10/13 1204    Visit Number 8   Number of Visits 17   PT Start Time 1024   PT Stop Time 1100   PT Time Calculation (min) 36 min   Equipment Utilized During Treatment Gait belt   Activity Tolerance Patient tolerated treatment well   Behavior During Therapy WFL for tasks assessed/performed      Past Medical History  Diagnosis Date  . OSA (obstructive sleep apnea) 09/2007    dx w/ a sleep study, Rx CPAP, weight loss  . Hyperlipidemia   . Hypertension   . Chest pain, atypical 11/2006    saw cards: neg/normal Holter, ECHo and myoview, admitted w/ CP 4/09 neg enz.and CT chest had a 9 beat NSVT  . Fibromyalgia   . Abnormal CT of the chest 2008    follow up CT, April 2009: unchanged. No f/u suggested   . 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 Shingles 11/2009  . Vaginal dysplasia   . Vaginal cancer 1994  . Type II diabetes mellitus   . Heart murmur   . CHF (congestive heart failure)   . Pneumonia     "double" (06/27/2012)  . History of chronic bronchitis   . Asthma   . H/O hiatal hernia   . GERD (gastroesophageal reflux disease)   . Ocular migraine   . TIA (transient ischemic attack) 06/27/2012    "this is my first" (06/27/2012)  . Rheumatoid factor positive   . Osteoarthritis   . Reactive airway disease 01/29/2002    dx of pseudoasthma / vcd in 2005 and nl sprirometry History of dyspnea, 2011,  improved after several medications were changed around Question of COPD, disproved July 06, 2009 with nl pft's      . TIA (transient ischemic attack)     x2    Past Surgical History  Procedure Laterality Date  . Thyroidectomy, partial  1960's  . Bunionectomy Left ~ 1977  . Breast biopsy Right 1999  . Anterior cervical decomp/discectomy fusion  2001  . Cataract extraction w/ intraocular lens   implant, bilateral  2012  . Vaginal mass excision  1994    "Laser surgery for vaginal cancer; followed by chemotherapy" (06/27/2012)  . Abdominal hysterectomy  1980    There were no vitals taken for this visit.  Visit Diagnosis:  Difficulty walking  Muscle weakness (generalized)  Stiffness of joint, shoulder region, right  Stiffness of joint, lower leg, right  Stiffness of joint, ankle and foot, left      Subjective Assessment - 12/10/13 1031    Symptoms Pt arrived 9 minutes late. Pt went to the hospital last Thursday due to virus, she was in the ED for a few hours to re-hydrate and feels much better. Pt denied falls since last visit.   Currently in Pain? No/denies            [facility] Adult PT Treatment/Exercise - 12/10/13 1024    Ambulation/Gait   Ambulation/Gait Yes   Ambulation/Gait Assistance 5: Supervision   Ambulation/Gait Assistance Details Pt ambulated 80' indoors/outdoors over even/uneven terrain, with supervision to ensure safety and no AD. No LOB episodes.   Ambulation Distance (Feet) 425 Feet   Assistive device None   Gait Pattern Step-through pattern;Decreased stride length;Antalgic   Standardized Balance  Assessment   Standardized Balance Assessment Dynamic Gait Index;Berg Balance Test   Berg Balance Test   Sit to Stand Able to stand without using hands and stabilize independently   Standing Unsupported Able to stand safely 2 minutes   Sitting with Back Unsupported but Feet Supported on Floor or Stool Able to sit safely and securely 2 minutes   Stand to Sit Sits safely with minimal use of hands   Transfers Able to transfer safely, minor use of hands   Standing Unsupported with Eyes Closed Able to stand 10 seconds safely   Standing Ubsupported with Feet Together Able to place feet together independently and stand 1 minute safely   From Standing, Reach Forward with Outstretched Arm Can reach forward >12 cm safely (5")  8 inches   From Standing Position, Pick  up Object from Floor Able to pick up shoe, needs supervision   From Standing Position, Turn to Look Behind Over each Shoulder Looks behind from both sides and weight shifts well   Turn 360 Degrees Able to turn 360 degrees safely one side only in 4 seconds or less   Standing Unsupported, Alternately Place Feet on Step/Stool Able to stand independently and safely and complete 8 steps in 20 seconds   Standing Unsupported, One Foot in Front Able to plae foot ahead of the other independently and hold 30 seconds   Standing on One Leg Able to lift leg independently and hold 5-10 seconds   Total Score 51   Dynamic Gait Index   Level Surface Normal   Change in Gait Speed Normal   Gait with Horizontal Head Turns Normal   Gait with Vertical Head Turns Mild Impairment   Gait and Pivot Turn Normal   Step Over Obstacle Mild Impairment   Step Around Obstacles Mild Impairment   Steps Mild Impairment   Total Score 20            PT Short Term Goals - 12/10/13 1207    PT SHORT TERM GOAL #1   Title Pt will be able to perform HEP independenly to improve safety and decreased risk for falls.   Time 0   Period Days   Status Achieved   PT SHORT TERM GOAL #2   Title Pt improve BERG balance score to >/=4 points to decrease falls risk.   Baseline Pt scored 51/56 on 12/10/13.   Time 0   Period Days   Status Achieved   PT SHORT TERM GOAL #3   Title Pt will be able to improve DGI score to >/=17, to decrease risk of falls.   Baseline Pt scored 20/24 on 12/10/13.   Time 0   Period Days   Status Achieved   PT SHORT TERM GOAL #4   Title Pt will be able to ambulate 300' over even/uneven terrain with LRAD and supervision, to decrease risk of falls.   Time 0   Period Days   Status Achieved          PT Long Term Goals - 12/10/13 1208    PT LONG TERM GOAL #1   Title Pt will be able to verbalize understanding of fall prevention strategies within home environment.   Period --  01/04/14   Status  On-going   PT LONG TERM GOAL #2   Title Pt will verbalize plans to continue fitness by joining a community fitness center upon D/C.   Time --  01/04/14   Status On-going   PT LONG TERM GOAL #3  Title Pt will improve BERG balance score by >/=8 points to decrease falls risk.   Baseline pt scored 51/56 on 12/10/13 and 47/56 on initial eval.   Period --  01/14/14   Status On-going   PT LONG TERM GOAL #4   Title Pt will be able to improve DGI score to >/=20, to decrease risk of falls.   Time 0   Period Days   Status Achieved   PT LONG TERM GOAL #5   Title Pt will be able to ambulate 500' over even/uneven terrain with LRAD at MOD I level, to decrease risk of falls.   Time --  01/04/14   Status On-going   PT LONG TERM GOAL #6   Title Pt will improve ABC scale score by >/= 18% to improve confidence in balance while performing ADLs.   Time --  01/04/14   Status On-going          Plan - 12/10/13 1204    Clinical Impression Statement Pt demonstrated progress today, as she met 4/4 STGs and LTG#4 (DGI). Pt's BERG balance score still indicates is at a moderate fall risk and would continue to benefit from skilled therapy to improve funcitional mobility and decrease falls risk.   Pt will benefit from skilled therapeutic intervention in order to improve on the following deficits Abnormal gait;Decreased strength;Decreased balance;Impaired flexibility;Decreased range of motion;Decreased endurance;Difficulty walking   Rehab Potential Good   PT Frequency 2x / week   PT Duration 8 weeks   PT Treatment/Interventions ADLs/Self Care Home Management;Gait training;Neuromuscular re-education;Stair training;Functional mobility training;Therapeutic activities;Patient/family education;Manual techniques;Therapeutic exercise;Balance training;DME Instruction   PT Next Visit Plan Dynamic gait over uneven/compliant surfaces, step-ups due to difficulty with stairs during DGI.  Dynamic gait activities.   Consulted  and Agree with Plan of Care Patient        Problem List Patient Active Problem List   Diagnosis Date Noted  . Chronic diastolic CHF (congestive heart failure), NYHA class 2 10/22/2013  . Gait difficulty 10/06/2013  . Pain in joint, shoulder region 11/27/2012  . VBI (vertebrobasilar insufficiency) 08/22/2012  . Right knee pain 07/29/2012  . TIA (transient ischemic attack) 06/27/2012  . Edema 02/01/2012  . DJD (degenerative joint disease) 02/02/2011  . Annual physical exam 06/06/2010  . 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 Postherpetic neuralgia ? 01/02/2010  . NECK PAIN 10/05/2009  . DYSPNEA ON EXERTION 12/06/2008  . Palpitations 11/23/2008  . UTI'S, RECURRENT 09/28/2008  . Fibromyalgia 08/15/2007  . FATIGUE 11/19/2006  . 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 HYPERLIPIDEMIA 05/21/2006  . HTN (hypertension) 05/21/2006  . Reactive airway disease 01/29/2002                                              Tlaloc Taddei L 12/10/2013, 12:12 PM

## 2013-12-12 ENCOUNTER — Other Ambulatory Visit: Payer: Self-pay | Admitting: Neurology

## 2013-12-15 ENCOUNTER — Encounter: Payer: Self-pay | Admitting: Physical Therapy

## 2013-12-15 ENCOUNTER — Ambulatory Visit: Payer: Medicare Other | Admitting: Physical Therapy

## 2013-12-15 DIAGNOSIS — R269 Unspecified abnormalities of gait and mobility: Secondary | ICD-10-CM | POA: Diagnosis not present

## 2013-12-15 DIAGNOSIS — M25661 Stiffness of right knee, not elsewhere classified: Secondary | ICD-10-CM

## 2013-12-15 DIAGNOSIS — M25672 Stiffness of left ankle, not elsewhere classified: Secondary | ICD-10-CM

## 2013-12-15 DIAGNOSIS — M6281 Muscle weakness (generalized): Secondary | ICD-10-CM

## 2013-12-15 DIAGNOSIS — R262 Difficulty in walking, not elsewhere classified: Secondary | ICD-10-CM

## 2013-12-15 NOTE — Therapy (Signed)
Physical Therapy Treatment  Patient Details  Name: Samantha [name] MRN: [id number] Date of Birth: Dec 02, 1940  Encounter Date: 12/15/2013      PT End of Session - 12/15/13 0932    Visit Number 9   Number of Visits 17   PT Start Time 0853   PT Stop Time 0935   PT Time Calculation (min) 42 min   Equipment Utilized During Treatment Gait belt   Activity Tolerance Patient tolerated treatment well;Patient limited by fatigue   Behavior During Therapy [facility] for tasks assessed/performed      Past Medical History  Diagnosis Date  . OSA (obstructive sleep apnea) 09/2007    dx w/ a sleep study, Rx CPAP, weight loss  . Hyperlipidemia   . Hypertension   . Chest pain, atypical 11/2006    saw cards: neg/normal Holter, ECHo and myoview, admitted w/ CP 4/09 neg enz.and CT chest had a 9 beat NSVT  . Fibromyalgia   . Abnormal CT of the chest 2008    follow up CT, April 2009: unchanged. No f/u suggested   . 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 Shingles 11/2009  . Vaginal dysplasia   . Vaginal cancer 1994  . Type II diabetes mellitus   . Heart murmur   . CHF (congestive heart failure)   . Pneumonia     "double" (06/27/2012)  . History of chronic bronchitis   . Asthma   . H/O hiatal hernia   . GERD (gastroesophageal reflux disease)   . Ocular migraine   . TIA (transient ischemic attack) 06/27/2012    "this is my first" (06/27/2012)  . Rheumatoid factor positive   . Osteoarthritis   . Reactive airway disease 01/29/2002    dx of pseudoasthma / vcd in 2005 and nl sprirometry History of dyspnea, 2011,  improved after several medications were changed around Question of COPD, disproved July 06, 2009 with nl pft's      . TIA (transient ischemic attack)     x2    Past Surgical History  Procedure Laterality Date  . Thyroidectomy, partial  1960's  . Bunionectomy Left ~ 1977  . Breast biopsy Right 1999  . Anterior cervical decomp/discectomy fusion  2001  . Cataract extraction  w/ intraocular lens  implant, bilateral  2012  . Vaginal mass excision  1994    "Laser surgery for vaginal cancer; followed by chemotherapy" (06/27/2012)  . Abdominal hysterectomy  1980    There were no vitals taken for this visit.  Visit Diagnosis:  Difficulty walking  Muscle weakness (generalized)  Stiffness of joint, lower leg, right  Stiffness of joint, ankle and foot, left      Subjective Assessment - 12/15/13 0856    Symptoms No new complaints, had a quiet weekend.  No falls.   Patient Stated Goals improve balance   Currently in Pain? No/denies            [facility] Adult PT Treatment/Exercise - 12/15/13 0907    Dynamic Standing Balance   Dynamic Standing - Balance Support No upper extremity supported;During functional activity   Dynamic Standing - Level of Assistance 4: Min assist   Dynamic Standing - Balance Activities Compliant surfaces;Head turns;Head nods;Eyes open  15'x2 each activity forward/backward   Dynamic Standing - Comments forwards/backwards with/without head turns; marching forwards/backwards; tandem walking   Knee/Hip Exercises: Aerobic   Stationary Bike [employer] stepper level 2.0 x 8 min lower extremities only for strengthening  Knee/Hip Exercises: Standing   Forward Step Up 10 reps;Step Height: 4";Left   Forward Step Up Limitations min UE support and cues for technique   Other Standing Knee Exercises standing marching; hamstring curls; hip abdct; hip ext x 10 reps bil with 2# and min UE support and VCs for correct technique for strengthening and single limb stance/balance          PT Education - 12/15/13 0932    Education provided No            PT Long Term Goals - 12/15/13 0935    PT LONG TERM GOAL #1   Title Pt will be able to verbalize understanding of fall prevention strategies within home environment.   Period --  01/04/14   Status On-going   PT LONG TERM GOAL #2   Title Pt will verbalize plans to continue fitness by joining a  community fitness center upon D/C.   Time --  01/04/14   Status On-going   PT LONG TERM GOAL #3   Title Pt will improve BERG balance score by >/=8 points to decrease falls risk. (at least 55/56)   Period --  01/04/14   Status On-going   PT LONG TERM GOAL #5   Title Pt will be able to ambulate 500' over even/uneven terrain with LRAD at MOD I level, to decrease risk of falls.   Time --  01/04/14   Status On-going   PT LONG TERM GOAL #6   Title Pt will improve ABC scale score by >/= 18% to improve confidence in balance while performing ADLs.   Time --  01/04/14   Status On-going          Plan - 12/15/13 0932    Clinical Impression Statement Pt continues to progress towards LTGs.  R knee valgus and pain affect ability to progress stairs and other functional mobility activities.  Will continue to benefit from PT to maximize functional mobility.   Pt will benefit from skilled therapeutic intervention in order to improve on the following deficits Abnormal gait;Decreased strength;Decreased balance;Impaired flexibility;Decreased range of motion;Decreased endurance;Difficulty walking   Rehab Potential Good   PT Frequency 2x / week   PT Duration 8 weeks   PT Treatment/Interventions ADLs/Self Care Home Management;Gait training;Neuromuscular re-education;Stair training;Functional mobility training;Therapeutic activities;Patient/family education;Manual techniques;Therapeutic exercise;Balance training;DME Instruction   PT Next Visit Plan continue dynamic gait/compliant surfaces.  Functional strengthening within pt's tolerance   PT Home Exercise Plan cont as instructed   Consulted and Agree with Plan of Care Patient        Problem List Patient Active Problem List   Diagnosis Date Noted  . Chronic diastolic CHF (congestive heart failure), NYHA class 2 10/22/2013  . Gait difficulty 10/06/2013  . Pain in joint, shoulder region 11/27/2012  . VBI (vertebrobasilar insufficiency) 08/22/2012  .  Right knee pain 07/29/2012  . TIA (transient ischemic attack) 06/27/2012  . Edema 02/01/2012  . DJD (degenerative joint disease) 02/02/2011  . Annual physical exam 06/06/2010  . 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 Postherpetic neuralgia ? 01/02/2010  . NECK PAIN 10/05/2009  . DYSPNEA ON EXERTION 12/06/2008  . Palpitations 11/23/2008  . UTI'S, RECURRENT 09/28/2008  . Fibromyalgia 08/15/2007  . FATIGUE 11/19/2006  . 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 HYPERLIPIDEMIA 05/21/2006  . HTN (hypertension) 05/21/2006  . Reactive airway disease 01/29/2002  Laureen Abrahams, PT, DPT 12/15/2013 9:39 AM

## 2013-12-16 ENCOUNTER — Ambulatory Visit: Payer: Medicare Other

## 2013-12-16 ENCOUNTER — Other Ambulatory Visit: Payer: Self-pay | Admitting: Neurology

## 2013-12-17 ENCOUNTER — Ambulatory Visit: Payer: Medicare Other | Admitting: Physical Therapy

## 2013-12-18 DIAGNOSIS — E119 Type 2 diabetes mellitus without complications: Secondary | ICD-10-CM | POA: Diagnosis not present

## 2013-12-18 DIAGNOSIS — Z961 Presence of intraocular lens: Secondary | ICD-10-CM | POA: Diagnosis not present

## 2013-12-18 DIAGNOSIS — G43109 Migraine with aura, not intractable, without status migrainosus: Secondary | ICD-10-CM | POA: Diagnosis not present

## 2013-12-18 LAB — HM DIABETES EYE EXAM

## 2013-12-21 ENCOUNTER — Other Ambulatory Visit: Payer: Self-pay

## 2013-12-22 ENCOUNTER — Ambulatory Visit: Payer: Medicare Other

## 2013-12-22 DIAGNOSIS — M6281 Muscle weakness (generalized): Secondary | ICD-10-CM

## 2013-12-22 DIAGNOSIS — R269 Unspecified abnormalities of gait and mobility: Secondary | ICD-10-CM | POA: Diagnosis not present

## 2013-12-22 DIAGNOSIS — M25611 Stiffness of right shoulder, not elsewhere classified: Secondary | ICD-10-CM

## 2013-12-22 DIAGNOSIS — M25661 Stiffness of right knee, not elsewhere classified: Secondary | ICD-10-CM

## 2013-12-22 DIAGNOSIS — R262 Difficulty in walking, not elsewhere classified: Secondary | ICD-10-CM

## 2013-12-22 DIAGNOSIS — M25672 Stiffness of left ankle, not elsewhere classified: Secondary | ICD-10-CM

## 2013-12-22 NOTE — Therapy (Signed)
Physical Therapy Treatment  Patient Details  Name: Samantha [name] MRN: [id number] Date of Birth: 06-16-1940  Encounter Date: 12/22/2013      PT End of Session - 12/22/13 1324    Visit Number 10   Number of Visits 17   PT Start Time 2694   PT Stop Time 1229   PT Time Calculation (min) 41 min   Equipment Utilized During Treatment Gait belt   Activity Tolerance Patient tolerated treatment well   Behavior During Therapy WFL for tasks assessed/performed      Past Medical History  Diagnosis Date  . OSA (obstructive sleep apnea) 09/2007    dx w/ a sleep study, Rx CPAP, weight loss  . Hyperlipidemia   . Hypertension   . Chest pain, atypical 11/2006    saw cards: neg/normal Holter, ECHo and myoview, admitted w/ CP 4/09 neg enz.and CT chest had a 9 beat NSVT  . Fibromyalgia   . Abnormal CT of the chest 2008    follow up CT, April 2009: unchanged. No f/u suggested   . 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 Shingles 11/2009  . Vaginal dysplasia   . Vaginal cancer 1994  . Type II diabetes mellitus   . Heart murmur   . CHF (congestive heart failure)   . Pneumonia     "double" (06/27/2012)  . History of chronic bronchitis   . Asthma   . H/O hiatal hernia   . GERD (gastroesophageal reflux disease)   . Ocular migraine   . TIA (transient ischemic attack) 06/27/2012    "this is my first" (06/27/2012)  . Rheumatoid factor positive   . Osteoarthritis   . Reactive airway disease 01/29/2002    dx of pseudoasthma / vcd in 2005 and nl sprirometry History of dyspnea, 2011,  improved after several medications were changed around Question of COPD, disproved July 06, 2009 with nl pft's      . TIA (transient ischemic attack)     x2    Past Surgical History  Procedure Laterality Date  . Thyroidectomy, partial  1960's  . Bunionectomy Left ~ 1977  . Breast biopsy Right 1999  . Anterior cervical decomp/discectomy fusion  2001  . Cataract extraction w/ intraocular lens   implant, bilateral  2012  . Vaginal mass excision  1994    "Laser surgery for vaginal cancer; followed by chemotherapy" (06/27/2012)  . Abdominal hysterectomy  1980    There were no vitals taken for this visit.  Visit Diagnosis:  Difficulty walking  Muscle weakness (generalized)  Stiffness of joint, lower leg, right  Stiffness of joint, ankle and foot, left  Stiffness of joint, shoulder region, right      Subjective Assessment - 12/22/13 1153    Symptoms Pt reported she had an ocular migraine last week and "took it easy" this weekend. Pt denied falls since last visit.   Currently in Pain? No/denies            [facility] Adult PT Treatment/Exercise - 12/22/13 1148    Ambulation/Gait   Ambulation/Gait Yes   Ambulation/Gait Assistance 5: Supervision   Ambulation/Gait Assistance Details Pt ambulated 500' outdoors over even/uneven terrain with min guard required over grassy terrain, and supervision on paved surfaces. Pt ambulated 345' and 75' indoors with supervision, with VC's to look straight ahead and to incr. speed. No LOB episodes.   Ambulation Distance (Feet) 925 Feet   Assistive device None   Gait Pattern Step-through  pattern;Decreased stride length;Antalgic   Dynamic Standing Balance   Dynamic Standing - Balance Support No upper extremity supported   Dynamic Standing - Level of Assistance Other (comment);5: Stand by assistance  Min Guards    Dynamic Standing - Balance Activities Compliant surfaces;Eyes open;Foam balance beam;Step ups;Lateral lean/weight shifting;Forward lean/weight shifting  bosu ball   Dynamic Standing - Comments Performed in parallel bars without UE support and with min Guard to supervision: Heel toe and sidestepping on foam balance beam, ant/post/lat weight shifting on bosu balll, step-ups x5 and ceased due to R knee pain. VC's to improve weight shifting and posture.   Standardized Balance Assessment   Standardized Balance Assessment --   Berg Balance  Test   Sit to Stand Able to stand without using hands and stabilize independently   Standing Unsupported Able to stand safely 2 minutes   Sitting with Back Unsupported but Feet Supported on Floor or Stool Able to sit safely and securely 2 minutes   Stand to Sit Sits safely with minimal use of hands   Transfers Able to transfer safely, minor use of hands   Standing Unsupported with Eyes Closed Able to stand 10 seconds safely   Standing Ubsupported with Feet Together Able to place feet together independently and stand 1 minute safely   From Standing, Reach Forward with Outstretched Arm Can reach forward >12 cm safely (5")  8 inches   From Standing Position, Pick up Object from Floor Able to pick up shoe, needs supervision   From Standing Position, Turn to Look Behind Over each Shoulder Looks behind from both sides and weight shifts well   Turn 360 Degrees Able to turn 360 degrees safely one side only in 4 seconds or less   Standing Unsupported, Alternately Place Feet on Step/Stool Able to stand independently and safely and complete 8 steps in 20 seconds   Standing Unsupported, One Foot in Front Able to plae foot ahead of the other independently and hold 30 seconds   Standing on One Leg Able to lift leg independently and hold 5-10 seconds   Total Score 51   Dynamic Gait Index   Level Surface Normal   Change in Gait Speed Normal   Gait with Horizontal Head Turns Normal   Gait with Vertical Head Turns Mild Impairment   Gait and Pivot Turn Normal   Step Over Obstacle Mild Impairment   Step Around Obstacles Mild Impairment   Steps Mild Impairment   Total Score 20   High Level Balance   High Level Balance Activities Backward walking;Braiding   High Level Balance Comments Compliant surface 7'x4/activity. VC's to improve weight shifting onto R LE in order to improve L LE step length.[name]      BERG and DGI information from 12/10/13 PT session, not 12/22/13 session.        PT Long Term Goals  - 12/22/13 1330    PT LONG TERM GOAL #1   Title Pt will be able to verbalize understanding of fall prevention strategies within home environment. Target date: 01/04/14   Status On-going   PT LONG TERM GOAL #2   Title Pt will verbalize plans to continue fitness by joining a community fitness center upon D/C. Target date: 01/04/14   Status On-going   PT LONG TERM GOAL #3   Title Pt will improve BERG balance score by >/=8 points to decrease falls risk. (at least 55/56). Target date: 01/04/14   Status On-going   PT LONG TERM GOAL #5  Title Pt will be able to ambulate 500' over even/uneven terrain with LRAD at MOD I level, to decrease risk of falls. Target date: 01/04/14   Status On-going   PT LONG TERM GOAL #6   Title Pt will improve ABC scale score by >/= 18% to improve confidence in balance while performing ADLs. Target date: 01/04/14   Status On-going          Plan - Jan 01, 2014 1325    Clinical Impression Statement Pt continues to progress towards LTGs. Pt's ability to perform step-ups limited by R knee pain/valgus. Pt would continue to benefit from skilled therapy to improve safety during functional mobility.   Pt will benefit from skilled therapeutic intervention in order to improve on the following deficits Abnormal gait;Decreased strength;Decreased balance;Impaired flexibility;Decreased range of motion;Decreased endurance;Difficulty walking   PT Frequency 2x / week   PT Duration 8 weeks   PT Treatment/Interventions ADLs/Self Care Home Management;Gait training;Neuromuscular re-education;Stair training;Functional mobility training;Therapeutic activities;Patient/family education;Manual techniques;Therapeutic exercise;Balance training;DME Instruction   PT Next Visit Plan Continue dynamic balance and gait activities over compliant surfaces and Sci[employer] for strengthening and endurance.   Consulted and Agree with Plan of Care Patient          G-Codes - 01-Jan-2014 1333    Functional Assessment  Tool Used DGI: 20/24 on 12/10/13   Functional Limitation Mobility: Walking and moving around   Mobility: Walking and Moving Around Current Status [phone]) At least 20 percent but less than 40 percent impaired, limited or restricted   Mobility: Walking and Moving Around Goal Status [phone]) At least 1 percent but less than 20 percent impaired, limited or restricted      Problem List Patient Active Problem List   Diagnosis Date Noted  . Chronic diastolic CHF (congestive heart failure), NYHA class 2 10/22/2013  . Gait difficulty 10/06/2013  . Pain in joint, shoulder region 11/27/2012  . VBI (vertebrobasilar insufficiency) 08/22/2012  . Right knee pain 07/29/2012  . TIA (transient ischemic attack) 06/27/2012  . Edema 02/01/2012  . DJD (degenerative joint disease) 02/02/2011  . Annual physical exam 06/06/2010  . 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 Postherpetic neuralgia ? 01/02/2010  . NECK PAIN 10/05/2009  . DYSPNEA ON EXERTION 12/06/2008  . Palpitations 11/23/2008  . UTI'S, RECURRENT 09/28/2008  . Fibromyalgia 08/15/2007  . FATIGUE 11/19/2006  . 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 HYPERLIPIDEMIA 05/21/2006  . HTN (hypertension) 05/21/2006  . Reactive airway disease 01/29/2002                                              Esparanza Krider L 2014/01/01, 1:41 PM  Deiona Hooper L, PT

## 2013-12-23 ENCOUNTER — Ambulatory Visit: Payer: Medicare Other

## 2013-12-23 DIAGNOSIS — M25661 Stiffness of right knee, not elsewhere classified: Secondary | ICD-10-CM

## 2013-12-23 DIAGNOSIS — M6281 Muscle weakness (generalized): Secondary | ICD-10-CM

## 2013-12-23 DIAGNOSIS — M25611 Stiffness of right shoulder, not elsewhere classified: Secondary | ICD-10-CM

## 2013-12-23 DIAGNOSIS — R269 Unspecified abnormalities of gait and mobility: Secondary | ICD-10-CM | POA: Diagnosis not present

## 2013-12-23 DIAGNOSIS — R262 Difficulty in walking, not elsewhere classified: Secondary | ICD-10-CM

## 2013-12-23 DIAGNOSIS — M25672 Stiffness of left ankle, not elsewhere classified: Secondary | ICD-10-CM

## 2013-12-23 NOTE — Therapy (Addendum)
Physical Therapy Treatment  Patient Details  Name: Samantha [name] MRN: [id number] Date of Birth: 1940/04/10  Encounter Date: 12/23/2013      PT End of Session - 12/23/13 1237    Visit Number 11   Number of Visits 17   PT Start Time 6761   PT Stop Time 1228   PT Time Calculation (min) 27 min   Equipment Utilized During Treatment Gait belt   Activity Tolerance Patient tolerated treatment well   Behavior During Therapy [facility] for tasks assessed/performed      Past Medical History  Diagnosis Date  . OSA (obstructive sleep apnea) 09/2007    dx w/ a sleep study, Rx CPAP, weight loss  . Hyperlipidemia   . Hypertension   . Chest pain, atypical 11/2006    saw cards: neg/normal Holter, ECHo and myoview, admitted w/ CP 4/09 neg enz.and CT chest had a 9 beat NSVT  . Fibromyalgia   . Abnormal CT of the chest 2008    follow up CT, April 2009: unchanged. No f/u suggested   . 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 Shingles 11/2009  . Vaginal dysplasia   . Vaginal cancer 1994  . Type II diabetes mellitus   . Heart murmur   . CHF (congestive heart failure)   . Pneumonia     "double" (06/27/2012)  . History of chronic bronchitis   . Asthma   . H/O hiatal hernia   . GERD (gastroesophageal reflux disease)   . Ocular migraine   . TIA (transient ischemic attack) 06/27/2012    "this is my first" (06/27/2012)  . Rheumatoid factor positive   . Osteoarthritis   . Reactive airway disease 01/29/2002    dx of pseudoasthma / vcd in 2005 and nl sprirometry History of dyspnea, 2011,  improved after several medications were changed around Question of COPD, disproved July 06, 2009 with nl pft's      . TIA (transient ischemic attack)     x2    Past Surgical History  Procedure Laterality Date  . Thyroidectomy, partial  1960's  . Bunionectomy Left ~ 1977  . Breast biopsy Right 1999  . Anterior cervical decomp/discectomy fusion  2001  . Cataract extraction w/ intraocular lens   implant, bilateral  2012  . Vaginal mass excision  1994    "Laser surgery for vaginal cancer; followed by chemotherapy" (06/27/2012)  . Abdominal hysterectomy  1980    There were no vitals taken for this visit.  Visit Diagnosis:  Difficulty walking  Muscle weakness (generalized)  Stiffness of joint, lower leg, right  Stiffness of joint, ankle and foot, left  Stiffness of joint, shoulder region, right      Subjective Assessment - 12/23/13 1202    Symptoms Pt arrived 15 minutes late. Pt reported R knee and hip pain increased, as she was up until midnight last night preparing for Thanksgiving.    Currently in Pain? Yes   Pain Score --  3-4/10   Pain Location Hip   Pain Orientation Right   Pain Descriptors / Indicators Aching;Sharp   Pain Type Chronic pain   Pain Radiating Towards R knee   Pain Onset More than a month ago   Pain Frequency Intermittent   Aggravating Factors  worse in the morning   Pain Relieving Factors medications and movement, and elevating R LE   Multiple Pain Sites No            [facility]  Adult PT Treatment/Exercise - 12/23/13 1201    Dynamic Standing Balance   Dynamic Standing - Balance Support No upper extremity supported   Dynamic Standing - Level of Assistance Other (comment);5: Stand by assistance;4: Min assist  Min Guard   Dynamic Standing - Balance Activities Eyes open;Lateral lean/weight shifting;Forward lean/weight shifting;Rocker board;Head turns;Head nods;Ball toss  bosu ball   Dynamic Standing - Comments Ant/post/lat on rockerboard with head turns and ball toss while maintaining balance on rockerboard. All with supervision to CGA, except for Min A required during two LOB episodes.   High Level Balance   High Level Balance Activities Backward walking;Braiding;Head turns;Turns   High Level Balance Comments Compliant surface 7'x3/activity. All with CGA-supervision for safety. VC's for technique. No LOB episodes. L step length improved today.              PT Long Term Goals - 12/23/13 1239    PT LONG TERM GOAL #1   Title Pt will be able to verbalize understanding of fall prevention strategies within home environment. Target date: 01/04/14   Status On-going   PT LONG TERM GOAL #2   Title Pt will verbalize plans to continue fitness by joining a community fitness center upon D/C. Target date: 01/04/14   Status On-going   PT LONG TERM GOAL #3   Title Pt will improve BERG balance score by >/=8 points to decrease falls risk. (at least 55/56). Target date: 01/04/14   Status On-going   PT LONG TERM GOAL #5   Title Pt will be able to ambulate 500' over even/uneven terrain with LRAD at MOD I level, to decrease risk of falls. Target date: 01/04/14   Status On-going   PT LONG TERM GOAL #6   Title Pt will improve ABC scale score by >/= 18% to improve confidence in balance while performing ADLs. Target date: 01/04/14   Status On-going          Plan - 12/23/13 1237    Clinical Impression Statement Pt demonstrated progress, as she was able to improve R LE stance time which improved L step length. Pt did experience 2 LOB episodes during dynamic balance activites and would continue to benefit from skilled therapy to improve safety during functional mobility.   Pt will benefit from skilled therapeutic intervention in order to improve on the following deficits Abnormal gait;Decreased strength;Decreased balance;Impaired flexibility;Decreased range of motion;Decreased endurance;Difficulty walking   Rehab Potential Good   PT Frequency 2x / week   PT Duration 8 weeks   PT Treatment/Interventions ADLs/Self Care Home Management;Gait training;Neuromuscular re-education;Stair training;Functional mobility training;Therapeutic activities;Patient/family education;Manual techniques;Therapeutic exercise;Balance training;DME Instruction   PT Next Visit Plan Begin to assess LTGs.   Consulted and Agree with Plan of Care Patient          G-Codes - 2014-01-05  1333    Functional Assessment Tool Used DGI: 20/24 on 12/10/13   Functional Limitation Mobility: Walking and moving around   Mobility: Walking and Moving Around Current Status [phone]) At least 20 percent but less than 40 percent impaired, limited or restricted   Mobility: Walking and Moving Around Goal Status [phone]) At least 1 percent but less than 20 percent impaired, limited or restricted    G-code for 01/05/14 visit, not today (12/23/13). Problem List Patient Active Problem List   Diagnosis Date Noted  . Chronic diastolic CHF (congestive heart failure), NYHA class 2 10/22/2013  . Gait difficulty 10/06/2013  . Pain in joint, shoulder region 11/27/2012  . VBI (vertebrobasilar insufficiency) 08/22/2012  .  Right knee pain 07/29/2012  . TIA (transient ischemic attack) 06/27/2012  . Edema 02/01/2012  . DJD (degenerative joint disease) 02/02/2011  . Annual physical exam 06/06/2010  . 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 Postherpetic neuralgia ? 01/02/2010  . NECK PAIN 10/05/2009  . DYSPNEA ON EXERTION 12/06/2008  . Palpitations 11/23/2008  . UTI'S, RECURRENT 09/28/2008  . Fibromyalgia 08/15/2007  . FATIGUE 11/19/2006  . 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 HYPERLIPIDEMIA 05/21/2006  . HTN (hypertension) 05/21/2006  . Reactive airway disease 01/29/2002                                              Sydnie Sigmund L 12/23/2013, 12:41 PM     Simcha Speir L, PT

## 2013-12-29 ENCOUNTER — Ambulatory Visit: Payer: Medicare Other | Attending: Neurology

## 2013-12-29 DIAGNOSIS — R262 Difficulty in walking, not elsewhere classified: Secondary | ICD-10-CM

## 2013-12-29 DIAGNOSIS — M25519 Pain in unspecified shoulder: Secondary | ICD-10-CM | POA: Insufficient documentation

## 2013-12-29 DIAGNOSIS — M6281 Muscle weakness (generalized): Secondary | ICD-10-CM | POA: Diagnosis not present

## 2013-12-29 DIAGNOSIS — M25611 Stiffness of right shoulder, not elsewhere classified: Secondary | ICD-10-CM

## 2013-12-29 DIAGNOSIS — M542 Cervicalgia: Secondary | ICD-10-CM | POA: Diagnosis not present

## 2013-12-29 DIAGNOSIS — M2550 Pain in unspecified joint: Secondary | ICD-10-CM | POA: Insufficient documentation

## 2013-12-29 DIAGNOSIS — M25672 Stiffness of left ankle, not elsewhere classified: Secondary | ICD-10-CM

## 2013-12-29 DIAGNOSIS — M25661 Stiffness of right knee, not elsewhere classified: Secondary | ICD-10-CM

## 2013-12-29 DIAGNOSIS — R269 Unspecified abnormalities of gait and mobility: Secondary | ICD-10-CM | POA: Diagnosis not present

## 2013-12-29 NOTE — Patient Instructions (Signed)

## 2013-12-29 NOTE — Therapy (Signed)
Physical Therapy Treatment  Patient Details  Name: Samantha [name] MRN: [id number] Date of Birth: 1940/05/12  Encounter Date: 12/29/2013      PT End of Session - 12/29/13 1219    Visit Number 12   Number of Visits 17   PT Start Time 9741   PT Stop Time 1217   PT Time Calculation (min) 18 min   Activity Tolerance Patient limited by pain   Behavior During Therapy [facility] for tasks assessed/performed      Past Medical History  Diagnosis Date  . OSA (obstructive sleep apnea) 09/2007    dx w/ a sleep study, Rx CPAP, weight loss  . Hyperlipidemia   . Hypertension   . Chest pain, atypical 11/2006    saw cards: neg/normal Holter, ECHo and myoview, admitted w/ CP 4/09 neg enz.and CT chest had a 9 beat NSVT  . Fibromyalgia   . Abnormal CT of the chest 2008    follow up CT, April 2009: unchanged. No f/u suggested   . 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 Shingles 11/2009  . Vaginal dysplasia   . Vaginal cancer 1994  . Type II diabetes mellitus   . Heart murmur   . CHF (congestive heart failure)   . Pneumonia     "double" (06/27/2012)  . History of chronic bronchitis   . Asthma   . H/O hiatal hernia   . GERD (gastroesophageal reflux disease)   . Ocular migraine   . TIA (transient ischemic attack) 06/27/2012    "this is my first" (06/27/2012)  . Rheumatoid factor positive   . Osteoarthritis   . Reactive airway disease 01/29/2002    dx of pseudoasthma / vcd in 2005 and nl sprirometry History of dyspnea, 2011,  improved after several medications were changed around Question of COPD, disproved July 06, 2009 with nl pft's      . TIA (transient ischemic attack)     x2    Past Surgical History  Procedure Laterality Date  . Thyroidectomy, partial  1960's  . Bunionectomy Left ~ 1977  . Breast biopsy Right 1999  . Anterior cervical decomp/discectomy fusion  2001  . Cataract extraction w/ intraocular lens  implant, bilateral  2012  . Vaginal mass excision  1994   "Laser surgery for vaginal cancer; followed by chemotherapy" (06/27/2012)  . Abdominal hysterectomy  1980    There were no vitals taken for this visit.  Visit Diagnosis:  Difficulty walking  Muscle weakness (generalized)  Stiffness of joint, lower leg, right  Stiffness of joint, ankle and foot, left  Stiffness of joint, shoulder region, right      Subjective Assessment - 12/29/13 1205    Symptoms Pt arrived 11 minutes late. Pt reported her R foot began to hurt/swollen after last visit due to old great toe surgical site irritation(less plantar padding). Pt denied falls since last visit. Pt is using SPC and states that walking increases pain and she does not wish to ambulate today.    Currently in Pain? Yes   Pain Score 5    Pain Location Foot   Pain Orientation Right   Pain Descriptors / Indicators Dull;Aching   Pain Type Acute pain   Pain Onset In the past 7 days   Pain Frequency Constant   Aggravating Factors  R LE weight brearing   Pain Relieving Factors Tylenol, ice and elevating R foot.  PT Education - 12/29/13 1219    Education provided Yes   Education Details Falls prevention handout. Educated pt on icing R foot for no more than 10-15 minutes to reduce pain and swelling. Discussed joining fitness center after d/c from PT.   Person(s) Educated Patient   Methods Explanation   Comprehension Verbalized understanding          PT Short Term Goals - 12/29/13 1225    PT SHORT TERM GOAL #1   Title Pt will be able to perform HEP independenly to improve safety and decreased risk for falls.   Time 0   Period Days   Status Achieved   PT SHORT TERM GOAL #2   Title Pt improve BERG balance score to >/=4 points to decrease falls risk.   Baseline Pt scored 51/56 on 12/10/13.   Time 0   Period Days   Status Achieved   PT SHORT TERM GOAL #3   Title Pt will be able to improve DGI score to >/=17, to decrease risk of falls.   Baseline Pt scored 20/24 on  12/10/13.   Time 0   Period Days   Status Achieved   PT SHORT TERM GOAL #4   Title Pt will be able to ambulate 300' over even/uneven terrain with LRAD and supervision, to decrease risk of falls.   Time 0   Period Days   Status Achieved          PT Long Term Goals - 12/29/13 1225    PT LONG TERM GOAL #1   Title Pt will be able to verbalize understanding of fall prevention strategies within home environment. Target date: 01/04/14   Time 0   Period Days   Status Achieved   PT LONG TERM GOAL #2   Title Pt will verbalize plans to continue fitness by joining a community fitness center upon D/C. Target date: 01/04/14   Time 0   Period Days   Status Achieved   PT LONG TERM GOAL #3   Title Pt will improve BERG balance score by >/=8 points to decrease falls risk. (at least 55/56). Target date: 01/04/14   Status On-going   PT LONG TERM GOAL #5   Title Pt will be able to ambulate 500' over even/uneven terrain with LRAD at MOD I level, to decrease risk of falls. Target date: 01/04/14   Status On-going   PT LONG TERM GOAL #6   Title Pt will improve ABC scale score by >/= 18% to improve confidence in balance while performing ADLs. Target date: 01/04/14   Status On-going          Plan - 12/29/13 1222    Clinical Impression Statement Unable to assess all goals today, as pt was not able to ambulate long distances or perform activites which required R LE weight bearing due to R foot pain/swelling. However, pt did meet 2 of the 5 remaining LTGs. Continue with POC.   Pt will benefit from skilled therapeutic intervention in order to improve on the following deficits Abnormal gait;Decreased strength;Decreased balance;Impaired flexibility;Decreased range of motion;Decreased endurance;Difficulty walking   Rehab Potential Good   PT Frequency 2x / week   PT Duration 8 weeks   PT Treatment/Interventions ADLs/Self Care Home Management;Gait training;Neuromuscular re-education;Stair training;Functional  mobility training;Therapeutic activities;Patient/family education;Manual techniques;Therapeutic exercise;Balance training;DME Instruction   PT Next Visit Plan Finish assessing LTGs.        Problem List Patient Active Problem List   Diagnosis Date Noted  . Chronic diastolic CHF (  congestive heart failure), NYHA class 2 10/22/2013  . Gait difficulty 10/06/2013  . Pain in joint, shoulder region 11/27/2012  . VBI (vertebrobasilar insufficiency) 08/22/2012  . Right knee pain 07/29/2012  . TIA (transient ischemic attack) 06/27/2012  . Edema 02/01/2012  . DJD (degenerative joint disease) 02/02/2011  . Annual physical exam 06/06/2010  . 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 Postherpetic neuralgia ? 01/02/2010  . NECK PAIN 10/05/2009  . DYSPNEA ON EXERTION 12/06/2008  . Palpitations 11/23/2008  . UTI'S, RECURRENT 09/28/2008  . Fibromyalgia 08/15/2007  . FATIGUE 11/19/2006  . 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 HYPERLIPIDEMIA 05/21/2006  . HTN (hypertension) 05/21/2006  . Reactive airway disease 01/29/2002                                              Vern Guerette L 12/29/2013, 12:28 PM     Geoffry Paradise, PT,DPT 12/29/2013 12:28 PM Phone: [phone] Fax: [phone]

## 2013-12-31 ENCOUNTER — Ambulatory Visit: Payer: Medicare Other

## 2013-12-31 DIAGNOSIS — M25661 Stiffness of right knee, not elsewhere classified: Secondary | ICD-10-CM

## 2013-12-31 DIAGNOSIS — M25611 Stiffness of right shoulder, not elsewhere classified: Secondary | ICD-10-CM

## 2013-12-31 DIAGNOSIS — R262 Difficulty in walking, not elsewhere classified: Secondary | ICD-10-CM

## 2013-12-31 DIAGNOSIS — M6281 Muscle weakness (generalized): Secondary | ICD-10-CM

## 2013-12-31 DIAGNOSIS — R269 Unspecified abnormalities of gait and mobility: Secondary | ICD-10-CM | POA: Diagnosis not present

## 2013-12-31 DIAGNOSIS — M25672 Stiffness of left ankle, not elsewhere classified: Secondary | ICD-10-CM

## 2013-12-31 NOTE — Therapy (Signed)
[facility] [address] [place], [place], [address] Phone: [phone]   Fax:  [phone]  Physical Therapy Treatment  Patient Details  Name: Samantha [name] MRN: [id number] Date of Birth: 11/05/1940  Encounter Date: 12/31/2013      PT End of Session - 12/31/13 1159    Visit Number 13   Number of Visits 17   PT Start Time 1107   PT Stop Time 1138   PT Time Calculation (min) 31 min   Equipment Utilized During Treatment Gait belt   Activity Tolerance Patient limited by pain   Behavior During Therapy WFL for tasks assessed/performed      Past Medical History  Diagnosis Date  . OSA (obstructive sleep apnea) 09/2007    dx w/ a sleep study, Rx CPAP, weight loss  . Hyperlipidemia   . Hypertension   . Chest pain, atypical 11/2006    saw cards: neg/normal Holter, ECHo and myoview, admitted w/ CP 4/09 neg enz.and CT chest had a 9 beat NSVT  . Fibromyalgia   . Abnormal CT of the chest 2008    follow up CT, April 2009: unchanged. No f/u suggested   . 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 Shingles 11/2009  . Vaginal dysplasia   . Vaginal cancer 1994  . Type II diabetes mellitus   . Heart murmur   . CHF (congestive heart failure)   . Pneumonia     "double" (06/27/2012)  . History of chronic bronchitis   . Asthma   . H/O hiatal hernia   . GERD (gastroesophageal reflux disease)   . Ocular migraine   . TIA (transient ischemic attack) 06/27/2012    "this is my first" (06/27/2012)  . Rheumatoid factor positive   . Osteoarthritis   . Reactive airway disease 01/29/2002    dx of pseudoasthma / vcd in 2005 and nl sprirometry History of dyspnea, 2011,  improved after several medications were changed around Question of COPD, disproved July 06, 2009 with nl pft's      . TIA (transient ischemic attack)     x2    Past Surgical History  Procedure Laterality Date  . Thyroidectomy, partial  1960's  . Bunionectomy Left ~  1977  . Breast biopsy Right 1999  . Anterior cervical decomp/discectomy fusion  2001  . Cataract extraction w/ intraocular lens  implant, bilateral  2012  . Vaginal mass excision  1994    "Laser surgery for vaginal cancer; followed by chemotherapy" (06/27/2012)  . Abdominal hysterectomy  1980    There were no vitals taken for this visit.  Visit Diagnosis:  Difficulty walking  Muscle weakness (generalized)  Stiffness of joint, lower leg, right  Stiffness of joint, ankle and foot, left  Stiffness of joint, shoulder region, right      Subjective Assessment - 12/31/13 1110    Symptoms Pt arrived 7 minutes late. Pt reported she feels better today. Pt denied falls since last visit.   Patient Stated Goals improve balance   Currently in Pain? Yes   Pain Score 3    Pain Location Foot   Pain Orientation Right   Pain Descriptors / Indicators Dull;Aching   Pain Type Acute pain   Pain Onset 1 to 4 weeks ago   Pain Frequency Constant   Aggravating Factors  R LE weight bearing   Pain Relieving Factors Tylenol, ice and elevating R foot.              [facility] Adult PT Treatment/Exercise - 12/31/13 1107    Ambulation/Gait   Ambulation/Gait Yes   Ambulation/Gait Assistance 6: Modified independent (Device/Increase time)   Ambulation/Gait Assistance Details Pt ambulated over even/uneven terrain with SPC due to incr. R foot pain. Pt demonstrated safe technique and did not experience any LOB episodes.   Ambulation Distance (Feet) 600 Feet   Assistive device Straight cane   Gait Pattern Step-through pattern;Decreased stride length;Antalgic   Gait velocity 3.44ft/sec.   Berg Balance Test   Sit to Stand Able to stand without using hands and stabilize independently   Standing Unsupported Able to stand safely 2 minutes   Sitting with Back Unsupported but Feet Supported on Floor or Stool Able to sit safely and securely 2 minutes   Stand to Sit Sits safely with minimal use of hands   Transfers  Able to transfer safely, minor use of hands   Standing Unsupported with Eyes Closed Able to stand 10 seconds safely   Standing Ubsupported with Feet Together Able to place feet together independently and stand 1 minute safely   From Standing, Reach Forward with Outstretched Arm Can reach confidently >25 cm (10")  11"   From Standing Position, Pick up Object from Floor Able to pick up shoe safely and easily   From Standing Position, Turn to Look Behind Over each Shoulder Looks behind from both sides and weight shifts well   Turn 360 Degrees Able to turn 360 degrees safely one side only in 4 seconds or less   Standing Unsupported, Alternately Place Feet on Step/Stool Able to stand independently and safely and complete 8 steps in 20 seconds   Standing Unsupported, One Foot in Front Able to place foot tandem independently and hold 30 seconds   Standing on One Leg Able to lift leg independently and hold > 10 seconds   Total Score 55    ABC scale score: 69.4%. Pt completed questionnaire.      PT Education - 12/31/13 1158    Education provided Yes   Education Details Reiterated the importance of continuing HEP and pt verbalized she has plans to build a walking track [place], as well as joining the local [employer].   Person(s) Educated Patient   Methods Explanation   Comprehension Verbalized understanding          PT Short Term Goals - 12/31/13 1200    PT SHORT TERM GOAL #1   Title Pt will be able to perform HEP independenly to improve safety and decreased risk for falls.   Time 0   Period Days   Status Achieved   PT SHORT TERM GOAL #2   Title Pt improve BERG balance score to >/=4 points to decrease falls risk.   Baseline Pt scored 51/56 on 12/10/13.   Time 0   Period Days   Status Achieved   PT SHORT TERM GOAL #3   Title Pt will be able to improve DGI score to >/=17, to decrease risk of falls.   Baseline Pt scored 20/24 on 12/10/13.   Time 0   Period Days   Status Achieved    PT SHORT TERM GOAL #4   Title Pt will be able to ambulate 300' over even/uneven terrain with LRAD and supervision, to decrease risk of falls.   Time 0   Period Days   Status Achieved          PT Long Term Goals - 12/31/13 1200    PT LONG TERM GOAL #1     Title Pt will be able to verbalize understanding of fall prevention strategies within home environment. Target date: 01/04/14   Time 0   Period Days   Status Achieved   PT LONG TERM GOAL #2   Title Pt will verbalize plans to continue fitness by joining a community fitness center upon D/C. Target date: 01/04/14   Time 0   Period Days   Status Achieved   PT LONG TERM GOAL #3   Title Pt will improve BERG balance score by >/=8 points to decrease falls risk. (at least 55/56). Target date: 01/04/14   Baseline 55/56 on January 27, 2014.   Status Achieved   PT LONG TERM GOAL #4   Status Achieved   PT LONG TERM GOAL #5   Title Pt will be able to ambulate 500' over even/uneven terrain with LRAD at MOD I level, to decrease risk of falls. Target date: 01/04/14   Status Achieved   PT LONG TERM GOAL #6   Title Pt will improve ABC scale score by >/= 18% to improve confidence in balance while performing ADLs. Target date: 01/04/14   Baseline 69.4%   Status Achieved          Plan - Jan 27, 2014 1159    Clinical Impression Statement Pt discharging today, please see discharge summary. Pt met 5/5 LTGs.          G-Codes - Jan 27, 2014 1201    Functional Assessment Tool Used DGI: 20/24; gait speed: 3.7f/sec.; BERG: 55/56.   Functional Limitation Mobility: Walking and moving around   Mobility: Walking and Moving Around Goal Status [phone] At least 1 percent but less than 20 percent impaired, limited or restricted   Mobility: Walking and Moving Around Discharge Status [phone] At least 1 percent but less than 20 percent impaired, limited or restricted                            Problem List Patient Active Problem List   Diagnosis  Date Noted  . Chronic diastolic CHF (congestive heart failure), NYHA class 2 10/22/2013  . Gait difficulty 10/06/2013  . Pain in joint, shoulder region 11/27/2012  . VBI (vertebrobasilar insufficiency) 08/22/2012  . Right knee pain 07/29/2012  . TIA (transient ischemic attack) 06/27/2012  . Edema 02/01/2012  . DJD (degenerative joint disease) 02/02/2011  . Annual physical exam 06/06/2010  . 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 Postherpetic neuralgia ? 01/02/2010  . NECK PAIN 10/05/2009  . DYSPNEA ON EXERTION 12/06/2008  . Palpitations 11/23/2008  . UTI'S, RECURRENT 09/28/2008  . Fibromyalgia 08/15/2007  . FATIGUE 11/19/2006  . 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 HYPERLIPIDEMIA 05/21/2006  . HTN (hypertension) 05/21/2006  . Reactive airway disease 01/29/2002    Ardys Hataway L 112-30-15 1:09 PM   PHYSICAL THERAPY DISCHARGE SUMMARY  Visits from Start of Care: 13  Current functional level related to goals / functional outcomes: Goals listed above. Pt met 5/5 LTGs and demonstrated increased safety during all functional mobility. Pt's gait and balance have improved since initial eval, as pt  Has not experienced a fall in over one month.   Remaining deficits: B genu recurvatum and chronic intermittent LE joint pain.    Education / Equipment: FPharmacist, hospitaland HEP for strengthening, flexibility, ROM, and balance.  Plan: Patient agrees to discharge.  Patient goals were met. Patient is being discharged due to meeting the stated rehab goals.  ?????   , PT,DPT 12/31/2013 1:14 PM Phone: [phone] Fax: [phone]     

## 2014-01-05 ENCOUNTER — Encounter: Payer: Self-pay | Admitting: Neurology

## 2014-01-05 ENCOUNTER — Telehealth: Payer: Self-pay | Admitting: Internal Medicine

## 2014-01-05 ENCOUNTER — Ambulatory Visit (INDEPENDENT_AMBULATORY_CARE_PROVIDER_SITE_OTHER): Payer: Medicare Other | Admitting: Neurology

## 2014-01-05 VITALS — BP 136/71 | HR 67 | Ht 62.0 in | Wt 192.0 lb

## 2014-01-05 DIAGNOSIS — R269 Unspecified abnormalities of gait and mobility: Secondary | ICD-10-CM | POA: Diagnosis not present

## 2014-01-05 DIAGNOSIS — M25561 Pain in right knee: Secondary | ICD-10-CM | POA: Diagnosis not present

## 2014-01-05 DIAGNOSIS — G43909 Migraine, unspecified, not intractable, without status migrainosus: Secondary | ICD-10-CM | POA: Insufficient documentation

## 2014-01-05 MED ORDER — BUTALBITAL-APAP-CAFFEINE 50-325-40 MG PO TABS: ORAL_TABLET | ORAL | Status: DC

## 2014-01-05 NOTE — Telephone Encounter (Signed)
Caller name:Samantha [name] Relation to ON:GEXB Call back number:[phone] Pharmacy:  Reason for call: pt states she received a message requesting her to update dr. Larose Kells on her last eye exam- pt had exam on 12/18/13 with dr. Gershon Crane.

## 2014-01-05 NOTE — Telephone Encounter (Signed)
I do not see where we have received any information on her visit with Dr. Gershon Crane, please inform Pt that she might need to sign a release of information for them to fax [place] the notes from the visit.
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
## 2014-01-05 NOTE — Telephone Encounter (Signed)
Pt stated she would contact the dr. Gershon Crane office and have them send the info

## 2014-01-29 DIAGNOSIS — C182 Malignant neoplasm of ascending colon: Secondary | ICD-10-CM

## 2014-01-29 HISTORY — DX: Malignant neoplasm of ascending colon: C18.2

## 2014-02-27 ENCOUNTER — Other Ambulatory Visit: Payer: Self-pay | Admitting: Physician Assistant

## 2014-02-27 ENCOUNTER — Other Ambulatory Visit: Payer: Self-pay | Admitting: Internal Medicine

## 2014-03-02 ENCOUNTER — Other Ambulatory Visit: Payer: Self-pay | Admitting: Cardiology

## 2014-04-15 DIAGNOSIS — Z124 Encounter for screening for malignant neoplasm of cervix: Secondary | ICD-10-CM | POA: Diagnosis not present

## 2014-04-15 DIAGNOSIS — E039 Hypothyroidism, unspecified: Secondary | ICD-10-CM | POA: Diagnosis not present

## 2014-04-15 DIAGNOSIS — N958 Other specified menopausal and perimenopausal disorders: Secondary | ICD-10-CM | POA: Diagnosis not present

## 2014-04-15 DIAGNOSIS — N760 Acute vaginitis: Secondary | ICD-10-CM | POA: Diagnosis not present

## 2014-04-15 DIAGNOSIS — Z6833 Body mass index (BMI) 33.0-33.9, adult: Secondary | ICD-10-CM | POA: Diagnosis not present

## 2014-04-15 DIAGNOSIS — Z1231 Encounter for screening mammogram for malignant neoplasm of breast: Secondary | ICD-10-CM | POA: Diagnosis not present

## 2014-04-19 LAB — HM MAMMOGRAPHY: HM Mammogram: NORMAL

## 2014-04-21 ENCOUNTER — Ambulatory Visit (INDEPENDENT_AMBULATORY_CARE_PROVIDER_SITE_OTHER): Payer: Medicare Other | Admitting: Cardiology

## 2014-04-21 ENCOUNTER — Encounter: Payer: Self-pay | Admitting: Cardiology

## 2014-04-21 VITALS — BP 130/85 | HR 79 | Ht 62.0 in | Wt 190.0 lb

## 2014-04-21 DIAGNOSIS — I5032 Chronic diastolic (congestive) heart failure: Secondary | ICD-10-CM | POA: Diagnosis not present

## 2014-04-21 DIAGNOSIS — R079 Chest pain, unspecified: Secondary | ICD-10-CM | POA: Diagnosis not present

## 2014-04-21 DIAGNOSIS — I10 Essential (primary) hypertension: Secondary | ICD-10-CM | POA: Diagnosis not present

## 2014-04-21 MED ORDER — HYDRALAZINE HCL 25 MG PO TABS: 25.0000 mg | ORAL_TABLET | Freq: Three times a day (TID) | ORAL | Status: DC

## 2014-04-21 NOTE — Progress Notes (Signed)
Patient ID: Samantha [name], female   DOB: [birth year], 74 y.o.   MRN: [id number]    Date:  04/21/2014   ID:  Samantha [name], Samantha [name] 06-25-1940, MRN [id number]  PCP:  Kathlene November, MD  Cardiologist:  [name] (Dr. Jenell Milliner )    History of Present Illness:  Samantha [name] is a 74 y.o. female who returns for f/u  She has a hx of DM2, HTN, HL, fibromyalgia, palpitations, LE edema. Nuclear study 11/08: Normal. Echocardiogram 3/14: Mild LVH, EF 58-85%, grade 2 diastolic dysfunction.  Admitted 05/2012 for RUE symptoms suspected to be a possible TIA. Carotid US 5/14:  < 39% bilateral ICA stenosis. MRI demonstrated no acute infarct. She had no significant disease on MRA. She was seen again in 7/14 for suspected vertigo/dizziness.   She went to the emergency room in June with chest discomfort and palpitations. Chest x-ray was unremarkable. Cardiac enzymes were also normal. She has had palpitations for years. Holter monitor x 24 hours 07/2012: NSR with PVCs.  She did have symptoms while wearing the Holter monitor.  I saw her in follow up 09/16/12.  Her BP was elevated. Beta blocker was adjusted. Lexiscan Myoview 09/24/12 was normal.  10/22/2013 - She is feeling better. She continues to have palpitations, checks her HR and at most 94 BPM. Chest pain on moderate exertion. Resolves at rest, stable for the last couple of years.  The patient denies chest pain, significant shortness of breath, syncope, orthopnea, PND or significant pedal edema.    04/21/2014 - the patient is coming after 6 months she feels really well, she describes that she has palpitation only about once a week lasting only a few seconds and usually when she is tired. They cause her minimal shortness of breath. She states that she can limited in it's not interrupting her life too much. She denies any chest pain or dyspnea on exertion. She denies any lower extremity edema, orthopnea or paroxysmal nocturnal dyspnea. She overall feels  really well and better since she was started on hydralazine and her blood pressure has been controlled. She is compliant with her medicines.  Wt Readings from Last 3 Encounters:  04/21/14 190 lb (86.183 kg)  01/05/14 192 lb (87.091 kg)  12/03/13 187 lb (84.823 kg)     Past Medical History  Diagnosis Date  . OSA (obstructive sleep apnea) 09/2007    dx w/ a sleep study, Rx CPAP, weight loss  . Hyperlipidemia   . Hypertension   . Chest pain, atypical 11/2006    saw cards: neg/normal Holter, ECHo and myoview, admitted w/ CP 4/09 neg enz.and CT chest had a 9 beat NSVT  . Fibromyalgia   . Abnormal CT of the chest 2008    follow up CT, April 2009: unchanged. No f/u suggested   . 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 Shingles 11/2009  . Vaginal dysplasia   . Vaginal cancer 1994  . Type II diabetes mellitus   . Heart murmur   . CHF (congestive heart failure)   . Pneumonia     "double" (06/27/2012)  . History of chronic bronchitis   . Asthma   . H/O hiatal hernia   . GERD (gastroesophageal reflux disease)   . Ocular migraine   . TIA (transient ischemic attack) 06/27/2012    "this is my first" (06/27/2012)  . Rheumatoid factor positive   . Osteoarthritis   . Reactive airway disease 01/29/2002  dx of pseudoasthma / vcd in 2005 and nl sprirometry History of dyspnea, 2011,  improved after several medications were changed around Question of COPD, disproved July 06, 2009 with nl pft's      . TIA (transient ischemic attack)     x2    Current Outpatient Prescriptions  Medication Sig Dispense Refill  . albuterol (PROVENTIL HFA;VENTOLIN HFA) 108 (90 BASE) MCG/ACT inhaler Inhale 2 puffs into the lungs every 6 (six) hours as needed for wheezing or shortness of breath.    [name] atorvastatin (LIPITOR) 10 MG tablet TAKE 1 TABLET (10 MG TOTAL) BY MOUTH DAILY. 90 tablet 1  . butalbital-acetaminophen-caffeine (FIORICET, ESGIC) 50-325-40 MG per tablet As needed for migraine 15 tablet 3    . BYSTOLIC 10 MG tablet TAKE 2 TABLETS BY MOUTH IN THE MORNING AND 1 TABLET IN THE EVENING SPACE 12 HOURS APART 90 tablet 6  . CALCIUM PO Take 1 tablet by mouth daily.      . cetirizine (ZYRTEC) 10 MG tablet Take 1 tablet (10 mg total) by mouth daily. 90 tablet 1  . cholecalciferol (VITAMIN D) 1000 UNITS tablet Take 1,000 Units by mouth daily.    . clopidogrel (PLAVIX) 75 MG tablet Take 1 tablet (75 mg total) by mouth daily with breakfast. 90 tablet 3  . cyclobenzaprine (FLEXERIL) 10 MG tablet Take 1 tablet (10 mg total) by mouth 2 (two) times daily as needed for muscle spasms. 30 tablet 6  . DULoxetine (CYMBALTA) 60 MG capsule TAKE 1 CAPSULE (60 MG TOTAL) BY MOUTH DAILY. 30 capsule 11  . fluticasone (FLONASE) 50 MCG/ACT nasal spray Place 2 sprays into the nose as needed for rhinitis.    . furosemide (LASIX) 20 MG tablet Take 20 mg by mouth 2 (two) times daily.    . hydrALAZINE (APRESOLINE) 25 MG tablet Take 1 tablet (25 mg total) by mouth 3 (three) times daily. 270 tablet 6  . lisinopril (PRINIVIL,ZESTRIL) 40 MG tablet Take 1 tablet (40 mg total) by mouth daily. 90 tablet 1  . metFORMIN (GLUCOPHAGE-XR) 500 MG 24 hr tablet TAKE 1 TABLET (500 MG TOTAL) BY MOUTH DAILY WITH BREAKFAST. 90 tablet 1  . Misc Natural Products (OSTEO BI-FLEX JOINT SHIELD) TABS Take 1 tablet by mouth daily.    . Multiple Vitamin (MULTIVITAMIN) capsule Take 1 capsule by mouth daily.      . ondansetron (ZOFRAN ODT) 4 MG disintegrating tablet Take 1 tablet (4 mg total) by mouth every 8 (eight) hours as needed for nausea or vomiting. 12 tablet 1  . Polyethyl Glycol-Propyl Glycol (SYSTANE) 0.4-0.3 % GEL Apply 1 drop to eye daily as needed (dry eyes).    . Potassium 99 MG TABS Take 99 mg by mouth daily.     . pregabalin (LYRICA) 100 MG capsule TAKE 1 CAPSULE AT BEDTIME AS NEEDED 30 capsule 6  . ranitidine (ZANTAC) 300 MG tablet TAKE 1 TABLET (300 MG TOTAL) BY MOUTH AT BEDTIME. 90 tablet 1   No current facility-administered  medications for this visit.    Allergies:    Allergies  Allergen Reactions  . Cefuroxime Axetil     REACTION: urticaria (hives)  . Oxycodone Nausea And Vomiting  . Seldane [Terfenadine]     REACTION: urticaria (hives)  . Zocor [Simvastatin]     2012 "Muscle breakdown " with profuse sweating  . Tramadol     REACTION: vomitting  . Bactrim [Sulfamethoxazole-Trimethoprim]     See OV 09-15-13, rash-tongue swelling due to bactrim ?  [name]  Pravastatin     "muscle breakdown" with profuse sweating    Social History:  The patient  reports that she has quit smoking. Her smoking use included Cigarettes. She has never used smokeless tobacco. She reports that she does not drink alcohol or use illicit drugs.   ROS:  Please see the history of present illness.      All other systems reviewed and negative.   PHYSICAL EXAM: VS:  BP 130/85 mmHg  Pulse 79  Ht 5\' 2"  (1.575 m)  Wt 190 lb (86.183 kg)  BMI 34.74 kg/m2  SpO2 96% Well nourished, well developed, in no acute distress HEENT: normal Neck: no JVD Cardiac:  normal S1, S2; RRR; no murmur Lungs:  clear to auscultation bilaterally, no wheezing, rhonchi or rales Abd: soft, nontender, no hepatomegaly Ext: no edema Skin: warm and dry Neuro:  CNs 2-12 intact, no focal abnormalities noted  EKG:  Sinus bradycardia, HR 59, no acute changes, PR 226   ECHO 04/17/2013 Left ventricle: The cavity size was normal. Wall thickness was increased in a pattern of mild LVH. Systolic function was normal. The estimated ejection fraction was in the range of 55% to 65%. Wall motion was normal; there were no regional wall motion abnormalities. Features are consistent with a pseudonormal left ventricular filling pattern, with concomitant abnormal relaxation and increased filling pressure (grade 2 diastolic dysfunction).       ASSESSMENT AND PLAN:  1. PVCs:  Symptomatically improved.  Continue current Rx.  2. Chest Pain:  Low risk Myoview.  No further  cardiac workup.  3. Hypertension:  Finally controlled after starting hydralazine 25 mg 3 times a day. Echocardiogram showed mild LVH and grade 2 DD on echo. Chronic diastolic CHF - well compensated. Crea stable. 4. Hyperlipidemia: at goal.  Continue statin. 5. Hx of TIA:  She remains on Plavix and has f/u with neurology.   6. Disposition: Follow up with Dr. Ena Dawley in 1 year.  Signed, [name], MD 04/21/2014 9:47 AM

## 2014-04-21 NOTE — Patient Instructions (Signed)
Your physician recommends that you continue on your current medications as directed. Please refer to the Current Medication list given to you today.   Your physician wants you to follow-up in: ONE YEAR WITH DR NELSON You will receive a reminder letter in the mail two months in advance. If you don't receive a letter, please call our office to schedule the follow-up appointment.  

## 2014-04-28 ENCOUNTER — Telehealth: Payer: Self-pay | Admitting: Internal Medicine

## 2014-04-28 NOTE — Telephone Encounter (Signed)
Pre Visit letter sent  °

## 2014-04-30 LAB — HM DEXA SCAN: HM Dexa Scan: NORMAL

## 2014-04-30 LAB — HM PAP SMEAR: HM PAP: NORMAL

## 2014-05-11 ENCOUNTER — Other Ambulatory Visit: Payer: Self-pay | Admitting: Internal Medicine

## 2014-05-18 ENCOUNTER — Telehealth: Payer: Self-pay | Admitting: *Deleted

## 2014-05-18 NOTE — Telephone Encounter (Signed)
Unable to reach patient at time of Pre-Visit Call.  Left message for patient to return call when available.    

## 2014-05-19 ENCOUNTER — Ambulatory Visit (INDEPENDENT_AMBULATORY_CARE_PROVIDER_SITE_OTHER): Payer: Medicare Other | Admitting: Internal Medicine

## 2014-05-19 ENCOUNTER — Encounter: Payer: Self-pay | Admitting: Internal Medicine

## 2014-05-19 VITALS — BP 118/68 | HR 69 | Temp 98.2°F | Ht 62.0 in | Wt 191.0 lb

## 2014-05-19 DIAGNOSIS — E785 Hyperlipidemia, unspecified: Secondary | ICD-10-CM | POA: Diagnosis not present

## 2014-05-19 DIAGNOSIS — I10 Essential (primary) hypertension: Secondary | ICD-10-CM

## 2014-05-19 DIAGNOSIS — Z0000 Encounter for general adult medical examination without abnormal findings: Secondary | ICD-10-CM

## 2014-05-19 DIAGNOSIS — M797 Fibromyalgia: Secondary | ICD-10-CM | POA: Diagnosis not present

## 2014-05-19 DIAGNOSIS — E119 Type 2 diabetes mellitus without complications: Secondary | ICD-10-CM

## 2014-05-19 DIAGNOSIS — J4520 Mild intermittent asthma, uncomplicated: Secondary | ICD-10-CM

## 2014-05-19 DIAGNOSIS — R079 Chest pain, unspecified: Secondary | ICD-10-CM

## 2014-05-19 DIAGNOSIS — Z23 Encounter for immunization: Secondary | ICD-10-CM

## 2014-05-19 LAB — BASIC METABOLIC PANEL
BUN: 17 mg/dL (ref 6–23)
CALCIUM: 9.4 mg/dL (ref 8.4–10.5)
CHLORIDE: 102 meq/L (ref 96–112)
CO2: 31 mEq/L (ref 19–32)
CREATININE: 0.95 mg/dL (ref 0.40–1.20)
GFR: 74.36 mL/min (ref >60.00)
Glucose, Bld: 104 mg/dL — ABNORMAL HIGH (ref 70–99)
Potassium: 3.8 mEq/L (ref 3.5–5.1)
Sodium: 137 mEq/L (ref 135–145)

## 2014-05-19 LAB — LIPID PANEL
CHOL/HDL RATIO: 3
Cholesterol: 171 mg/dL (ref 0–200)
HDL: 65.5 mg/dL (ref >39.00)
LDL Cholesterol: 83 mg/dL (ref 0–99)
NonHDL: 105.5
Triglycerides: 112 mg/dL (ref 0.0–149.0)
VLDL: 22.4 mg/dL (ref 0.0–40.0)

## 2014-05-19 LAB — HEMOGLOBIN A1C: Hgb A1c MFr Bld: 6.3 % (ref 4.6–6.5)

## 2014-05-19 MED ORDER — LISINOPRIL 40 MG PO TABS: 40.0000 mg | ORAL_TABLET | Freq: Every day | ORAL | Status: DC

## 2014-05-19 MED ORDER — ATORVASTATIN CALCIUM 10 MG PO TABS: 10.0000 mg | ORAL_TABLET | Freq: Every day | ORAL | Status: DC

## 2014-05-19 MED ORDER — METFORMIN HCL ER 500 MG PO TB24: 500.0000 mg | ORAL_TABLET | Freq: Every day | ORAL | Status: DC

## 2014-05-19 MED ORDER — FUROSEMIDE 20 MG PO TABS: 20.0000 mg | ORAL_TABLET | Freq: Two times a day (BID) | ORAL | Status: DC

## 2014-05-19 MED ORDER — RANITIDINE HCL 300 MG PO TABS: 300.0000 mg | ORAL_TABLET | Freq: Every day | ORAL | Status: DC

## 2014-05-19 NOTE — Progress Notes (Signed)
Subjective:    Patient ID: Samantha [name], female    DOB: 1940-03-29, 74 y.o.   MRN: [id number]  DOS:  05/19/2014 Type of visit - description :  Here for Medicare AWV:   1. Risk factors based on Past M, S, F history: reviewed   2. Physical Activities: busy at home, some home exercises  3. Depression/mood: Neg screening despite her many challenges w/ her husband    4. Hearing: No problems noted , slt decreased?  per pt  5. ADL's: Independent   6. Fall Risk:  no recent falls , prevention discussed   7. home Safety: does feel safe at home   8. Height, weight, &visual acuity: see VS, s/p lenses implant, great results ; sees eye doctor regulalrly 9. Counseling: provided   10. Labs ordered based on risk factors: if needed   11. Referral Coordination: if needed   12. Care Plan, see assessment and plan   13. Cognitive Assessment: Cognition and motor skills within normal  14. Care team updated  15. End of life care , discussed, rec a health care power of attorney  In addition, today we discussed the following: Diabetes, on metformin, good compliance, ambulatory blood sugars very good at around 100 History of ocular migraines, no recent events History of asthma, uses albuterol less than once a week. History of fibromyalgia, symptoms relatively well controlled Chest pain, note from cardiology reviewed, felt to be stable and no further workup needed  Review of Systems  Constitutional: No fever, chills. No unexplained wt changes. No unusual sweats HEENT: No dental problems, ear discharge, facial swelling, voice changes. No eye discharge they are actually somewhat dry, redness or intolerance to light Respiratory: No frequent  wheezing , no difficulty breathing. No cough , mucus production Cardiovascular: No CP, leg swelling or palpitations GI: no nausea, vomiting, diarrhea or abdominal pain.  Occasionally sees red blood per rectum when she wipes, stools are otherwise normal-appearing.  Occasional dysphagia since she had the cervical spine surgery many years ago  Endocrine: No polyphagia, polyuria or polydipsia GU: No dysuria, gross hematuria, difficulty urinating. No urinary urgency or frequency. Musculoskeletal: No joint swellings or unusual aches or pains Skin: No change in the color of the skin, palor or rash Allergic, immunologic: No environmental allergies or food allergies Neurological: No dizziness or syncope. No headaches. No diplopia, slurred speech, motor deficits, facial numbness Hematological: No enlarged lymph nodes, easy bruising or bleeding Psychiatry: No suicidal ideas, hallucinations, behavior problems or confusion. No unusual/severe anxiety or depression.    Past Medical History  Diagnosis Date  . OSA (obstructive sleep apnea) 09/2007    dx w/ a sleep study, not on  CPAP  . Hyperlipidemia   . Hypertension   . Chest pain, atypical 11/2006    saw cards: neg/normal Holter, ECHo and myoview, admitted w/ CP 4/09 neg enz.and CT chest had a 9 beat NSVT  . Fibromyalgia   . Abnormal CT of the chest 2008    last CT4-l 2009:  . No f/u suggested   . 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 Shingles 11/2009  . Vaginal dysplasia   . Vaginal cancer 1994  . Type II diabetes mellitus   . Heart murmur   . CHF (congestive heart failure)   . Pneumonia     "double" (06/27/2012)  . Asthma   . H/O hiatal hernia   . GERD (gastroesophageal reflux disease)   . Ocular migraine   .  TIA (transient ischemic attack) 06/27/2012    "this is my first" (06/27/2012)  . Rheumatoid factor positive   . Osteoarthritis   . Reactive airway disease 01/29/2002    dx of pseudoasthma / vcd in 2005 and nl sprirometry History of dyspnea, 2011,  improved after several medications were changed around Question of COPD, disproved July 06, 2009 with nl pft's      . TIA (transient ischemic attack)     x2    Past Surgical History  Procedure Laterality Date  . Thyroidectomy,  partial  1960's  . Bunionectomy Left ~ 1977  . Breast biopsy Right 1999  . Anterior cervical decomp/discectomy fusion  2001  . Cataract extraction w/ intraocular lens  implant, bilateral  2012  . Vaginal mass excision  1994    "Laser surgery for vaginal cancer; followed by chemotherapy" (06/27/2012)  . Abdominal hysterectomy  1980    NO oophorectomy per pt     History   Social History  . Marital Status: Married    Spouse Name: Ilona Sorrel  . Number of Children: 2  . Years of Education: masters   Occupational History  . Retired, disable since 2000    Social History Main Topics  . Smoking status: Former Smoker    Types: Cigarettes  . Smokeless tobacco: Never Used     Comment: Quit in 2001  . Alcohol Use: No  . Drug Use: No  . Sexual Activity: No   Other Topics Concern  . Not on file   Social History Narrative   On disability since 2000--- also husband has MS   Education. College.   Right handed.     Family History  Problem Relation Age of Onset  . Allergies Sister   . Parkinsonism Sister     possible  . Asthma Sister   . Asthma Paternal Grandmother   . Heart disease Father   . Heart disease Mother   . Lung cancer Mother   . Heart disease      paternal grandparents, maternal grandparents,   . Heart disease Brother   . Emphysema Brother   . Aneurysm Brother     x3  . Kidney failure Brother   . Diabetes Brother   . Breast cancer Neg Hx   . Colon cancer Neg Hx   . Diabetes Brother   . Heart attack Neg Hx   . Stroke Brother   . Stroke Maternal Grandmother   . Stroke Paternal Grandmother        Medication List       This list is accurate as of: 05/19/14 11:59 PM.  Always use your most recent med list.               albuterol 108 (90 BASE) MCG/ACT inhaler  Commonly known as:  PROVENTIL HFA;VENTOLIN HFA  Inhale 2 puffs into the lungs every 6 (six) hours as needed for wheezing or shortness of breath.     atorvastatin 10 MG tablet  Commonly known as:   LIPITOR  Take 1 tablet (10 mg total) by mouth daily.     butalbital-acetaminophen-caffeine 50-325-40 MG per tablet  Commonly known as:  FIORICET, ESGIC  As needed for migraine     BYSTOLIC 10 MG tablet  Generic drug:  nebivolol  TAKE 2 TABLETS BY MOUTH IN THE MORNING AND 1 TABLET IN THE EVENING SPACE 12 HOURS APART     CALCIUM PO  Take 1 tablet by mouth daily.     cetirizine  10 MG tablet  Commonly known as:  ZYRTEC  Take 1 tablet (10 mg total) by mouth daily.     cholecalciferol 1000 UNITS tablet  Commonly known as:  VITAMIN D  Take 1,000 Units by mouth daily.     clopidogrel 75 MG tablet  Commonly known as:  PLAVIX  Take 1 tablet (75 mg total) by mouth daily with breakfast.     cyclobenzaprine 10 MG tablet  Commonly known as:  FLEXERIL  Take 1 tablet (10 mg total) by mouth 2 (two) times daily as needed for muscle spasms.     DULoxetine 60 MG capsule  Commonly known as:  CYMBALTA  TAKE 1 CAPSULE (60 MG TOTAL) BY MOUTH DAILY.     fluticasone 50 MCG/ACT nasal spray  Commonly known as:  FLONASE  Place 2 sprays into the nose as needed for rhinitis.     furosemide 20 MG tablet  Commonly known as:  LASIX  Take 1 tablet (20 mg total) by mouth 2 (two) times daily.     hydrALAZINE 25 MG tablet  Commonly known as:  APRESOLINE  Take 1 tablet (25 mg total) by mouth 3 (three) times daily.     lisinopril 40 MG tablet  Commonly known as:  PRINIVIL,ZESTRIL  Take 1 tablet (40 mg total) by mouth daily.     metFORMIN 500 MG 24 hr tablet  Commonly known as:  GLUCOPHAGE-XR  Take 1 tablet (500 mg total) by mouth daily with breakfast.     multivitamin capsule  Take 1 capsule by mouth daily.     ondansetron 4 MG disintegrating tablet  Commonly known as:  ZOFRAN ODT  Take 1 tablet (4 mg total) by mouth every 8 (eight) hours as needed for nausea or vomiting.     OSTEO BI-FLEX JOINT SHIELD Tabs  Take 1 tablet by mouth daily.     Potassium 99 MG Tabs  Take 99 mg by mouth daily.       pregabalin 100 MG capsule  Commonly known as:  LYRICA  TAKE 1 CAPSULE AT BEDTIME AS NEEDED     ranitidine 300 MG tablet  Commonly known as:  ZANTAC  Take 1 tablet (300 mg total) by mouth at bedtime.     SYSTANE 0.4-0.3 % Gel  Generic drug:  Polyethyl Glycol-Propyl Glycol  Apply 1 drop to eye daily as needed (dry eyes).           Objective:   Physical Exam BP 118/68 mmHg  Pulse 69  Temp(Src) 98.2 F (36.8 C) (Oral)  Ht 5\' 2"  (1.575 m)  Wt 191 lb (86.637 kg)  BMI 34.93 kg/m2  SpO2 97%  General:   Well developed, well nourished . NAD.  Neck:  Full range of motion. Supple. No  thyromegaly , normal carotid pulse. Well-healed surgical scar HEENT:  Normocephalic . Face symmetric, atraumatic Lungs:  CTA B Normal respiratory effort, no intercostal retractions, no accessory muscle use. Heart: RRR,  no murmur.  Abdomen:  Not distended, soft, non-tender. No rebound or rigidity. No mass,organomegaly Muscle skeletal: no pretibial edema bilaterally  Skin: Exposed areas without rash. Not pale. Not jaundice Neurologic:  alert & oriented X3.  Speech normal, gait appropriate for age and unassisted Strength symmetric and appropriate for age.  Psych: Cognition and judgment appear intact.  Cooperative with normal attention span and concentration.  Behavior appropriate. No anxious or depressed appearing.       Assessment & Plan:

## 2014-05-19 NOTE — Assessment & Plan Note (Signed)
Hardly ever uses albuterol, provide a Prevnar today

## 2014-05-19 NOTE — Assessment & Plan Note (Signed)
She sees the eye doctor regularly, labs

## 2014-05-19 NOTE — Assessment & Plan Note (Signed)
Symptoms relatively well controlled with Lyrica, Flexeril, Cymbalta.

## 2014-05-19 NOTE — Patient Instructions (Signed)
Get your blood work before you leave   We are referring you to our stomach specialist  Come back to the office IN 6 MONTHS  for a routine check up     Fall Prevention and [place] cause injuries and can affect all age groups. It is possible to use preventive measures to significantly decrease the likelihood of falls. There are many simple measures which can make your home safer and prevent falls. OUTDOORS  Repair cracks and edges of walkways and driveways.  Remove high doorway thresholds.  Trim shrubbery on the main path into your home.  Have good outside lighting.  Clear walkways of tools, rocks, debris, and clutter.  Check that handrails are not broken and are securely fastened. Both sides of steps should have handrails.  Have leaves, snow, and ice cleared regularly.  Use sand or salt on walkways during winter months.  In the garage, clean up grease or oil spills. BATHROOM  Install night lights.  Install grab bars by the toilet and in the tub and shower.  Use non-skid mats or decals in the tub or shower.  Place a plastic non-slip stool in the shower to sit on, if needed.  Keep floors dry and clean up all water on the floor immediately.  Remove soap buildup in the tub or shower on a regular basis.  Secure bath mats with non-slip, double-sided rug tape.  Remove throw rugs and tripping hazards from the floors. BEDROOMS  Install night lights.  Make sure a bedside light is easy to reach.  Do not use oversized bedding.  Keep a telephone by your bedside.  Have a firm chair with side arms to use for getting dressed.  Remove throw rugs and tripping hazards from the floor. KITCHEN  Keep handles on pots and pans turned toward the center of the stove. Use back burners when possible.  Clean up spills quickly and allow time for drying.  Avoid walking on wet floors.  Avoid hot utensils and knives.  Position shelves so they are not too high or  low.  Place commonly used objects within easy reach.  If necessary, use a sturdy step stool with a grab bar when reaching.  Keep electrical cables out of the way.  Do not use floor polish or wax that makes floors slippery. If you must use wax, use non-skid floor wax.  Remove throw rugs and tripping hazards from the floor. STAIRWAYS  Never leave objects on stairs.  Place handrails on both sides of stairways and use them. Fix any loose handrails. Make sure handrails on both sides of the stairways are as long as the stairs.  Check carpeting to make sure it is firmly attached along stairs. Make repairs to worn or loose carpet promptly.  Avoid placing throw rugs at the top or bottom of stairways, or properly secure the rug with carpet tape to prevent slippage. Get rid of throw rugs, if possible.  Have an electrician put in a light switch at the top and bottom of the stairs. OTHER FALL PREVENTION TIPS  Wear low-heel or rubber-soled shoes that are supportive and fit well. Wear closed toe shoes.  When using a stepladder, make sure it is fully opened and both spreaders are firmly locked. Do not climb a closed stepladder.  Add color or contrast paint or tape to grab bars and handrails in your home. Place contrasting color strips on first and last steps.  Learn and use mobility aids as needed. Install an Dealer  emergency response system.  Turn on lights to avoid dark areas. Replace light bulbs that burn out immediately. Get light switches that glow.  Arrange furniture to create clear pathways. Keep furniture in the same place.  Firmly attach carpet with non-skid or double-sided tape.  Eliminate uneven floor surfaces.  Select a carpet pattern that does not visually hide the edge of steps.  Be aware of all pets. OTHER HOME SAFETY TIPS  Set the water temperature for 120 F (48.8 C).  Keep emergency numbers on or near the telephone.  Keep smoke detectors on every level of the  home and near sleeping areas. Document Released: 01/05/2002 Document Revised: 07/17/2011 Document Reviewed: 04/06/2011 [facility] Patient Information 2015 [place], [place]. This information is not intended to replace advice given to you by your health care provider. Make sure you discuss any questions you have with your health care provider.   Preventive Care for Adults Ages 39 and over  Blood pressure check.** / Every 1 to 2 years.  Lipid and cholesterol check.**/ Every 5 years beginning at age 76.  Lung cancer screening. / Every year if you are aged 96-80 years and have a 30-pack-year history of smoking and currently smoke or have quit within the past 15 years. Yearly screening is stopped once you have quit smoking for at least 15 years or develop a health problem that would prevent you from having lung cancer treatment.  Fecal occult blood test (FOBT) of stool. / Every year beginning at age 21 and continuing until age 45. You may not have to do this test if you get a colonoscopy every 10 years.  Flexible sigmoidoscopy** or colonoscopy.** / Every 5 years for a flexible sigmoidoscopy or every 10 years for a colonoscopy beginning at age 79 and continuing until age 66.  Hepatitis C blood test.** / For all people born from [birth year] through 1963 and any individual with known risks for hepatitis C.  Abdominal aortic aneurysm (AAA) screening.** / A one-time screening for ages 61 to 33 years who are current or former smokers.  Skin self-exam. / Monthly.  Influenza vaccine. / Every year.  Tetanus, diphtheria, and acellular pertussis (Tdap/Td) vaccine.** / 1 dose of Td every 10 years.  Varicella vaccine.** / Consult your health care provider.  Zoster vaccine.** / 1 dose for adults aged 69 years or older.  Pneumococcal 13-valent conjugate (PCV13) vaccine.** / Consult your health care provider.  Pneumococcal polysaccharide (PPSV23) vaccine.** / 1 dose for all adults aged 14 years and  older.  Meningococcal vaccine.** / Consult your health care provider.  Hepatitis A vaccine.** / Consult your health care provider.  Hepatitis B vaccine.** / Consult your health care provider.  Haemophilus influenzae type b (Hib) vaccine.** / Consult your health care provider. **Family history and personal history of risk and conditions may change your health care provider's recommendations. Document Released: 03/13/2001 Document Revised: 01/20/2013 Document Reviewed: 06/12/2010 [facility] Patient Information 2015 [place], [place]. This information is not intended to replace advice given to you by your health care provider. Make sure you discuss any questions you have with your health care provider.

## 2014-05-19 NOTE — Assessment & Plan Note (Signed)
Seems well-controlled, check a BMP, continue with present care

## 2014-05-19 NOTE — Assessment & Plan Note (Addendum)
TD  07-2011  pneumonia shot 2004 and 2010   prevnar-- today zostavax ==> 11-2012  Female care per gynecology, to see Dr Julien Girt  Pap 03-2014 neg  Last 3D  MMG  At gyn 03-2014 neg Last DEXA ~ 03-2014 T score 1.4, normal (@ gyn)  She does have red blood per rectum sometimes, she thinks related to hemorrhoids. Colonoscopy: 2006, overdue for a cscope----- referral  Diet-exercise discussed

## 2014-05-19 NOTE — Assessment & Plan Note (Signed)
Still has occasional chest pain, last visit with cardiology last month, no further testing is indicated

## 2014-05-19 NOTE — Progress Notes (Signed)
Pre visit review using our clinic review tool, if applicable. No additional management support is needed unless otherwise documented below in the visit note. 

## 2014-05-31 ENCOUNTER — Encounter: Payer: Self-pay | Admitting: Internal Medicine

## 2014-06-11 ENCOUNTER — Telehealth: Payer: Self-pay | Admitting: Internal Medicine

## 2014-06-11 NOTE — Telephone Encounter (Signed)
I sent message to GI to get patient scheduled, once aware of appt I will call patient

## 2014-06-11 NOTE — Telephone Encounter (Signed)
Caller name: Samantha [name]  Relation to pt: self  Call back number: [phone]   Reason for call:  Pt inquiring about lab results and would like to know the status of colonoscopy order and gastro referral.  Advised pt gastro referral has been placed awaiting appointment.

## 2014-06-11 NOTE — Telephone Encounter (Signed)
Spoke with Pt, informed her of lab results from 05/20/2014. Pt informed me that she has not heard from anyone at GI to schedule her colonoscopy. Referral was placed on 05/20/2014 as well. Pt previously seen [name] and would like to see him again if possible. Pt requesting call back regarding status of referral. Thanks.

## 2014-06-16 NOTE — Telephone Encounter (Signed)
Patient has appointment on 07/20/14 w/ Dr Ardis Hughs

## 2014-06-22 ENCOUNTER — Other Ambulatory Visit: Payer: Self-pay | Admitting: Internal Medicine

## 2014-06-22 NOTE — Telephone Encounter (Signed)
Pt is requesting refill on Lyrica.   Last OV: 05/19/2014  Last Fill: 11/13/2013 #[id number]  Please advise.

## 2014-06-22 NOTE — Telephone Encounter (Signed)
Okay #30 and 6 refills

## 2014-06-22 NOTE — Telephone Encounter (Signed)
Rx printed, awaiting MD signature.  

## 2014-06-22 NOTE — Telephone Encounter (Signed)
Rx faxed to [facility] pharmacy.  

## 2014-07-06 ENCOUNTER — Ambulatory Visit (INDEPENDENT_AMBULATORY_CARE_PROVIDER_SITE_OTHER): Payer: Medicare Other | Admitting: Neurology

## 2014-07-06 ENCOUNTER — Encounter: Payer: Self-pay | Admitting: Neurology

## 2014-07-06 VITALS — BP 143/79 | HR 60 | Ht 62.0 in | Wt 191.0 lb

## 2014-07-06 DIAGNOSIS — M542 Cervicalgia: Secondary | ICD-10-CM

## 2014-07-06 DIAGNOSIS — R269 Unspecified abnormalities of gait and mobility: Secondary | ICD-10-CM | POA: Diagnosis not present

## 2014-07-06 DIAGNOSIS — M25561 Pain in right knee: Secondary | ICD-10-CM | POA: Diagnosis not present
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
## 2014-07-07 ENCOUNTER — Ambulatory Visit: Payer: Medicare Other | Admitting: Neurology

## 2014-07-20 ENCOUNTER — Ambulatory Visit (INDEPENDENT_AMBULATORY_CARE_PROVIDER_SITE_OTHER): Payer: Medicare Other | Admitting: Gastroenterology

## 2014-07-20 ENCOUNTER — Encounter: Payer: Self-pay | Admitting: Gastroenterology

## 2014-07-20 ENCOUNTER — Other Ambulatory Visit (INDEPENDENT_AMBULATORY_CARE_PROVIDER_SITE_OTHER): Payer: Medicare Other

## 2014-07-20 ENCOUNTER — Telehealth: Payer: Self-pay

## 2014-07-20 VITALS — BP 112/66 | HR 74 | Ht 62.0 in | Wt 193.2 lb

## 2014-07-20 DIAGNOSIS — K625 Hemorrhage of anus and rectum: Secondary | ICD-10-CM

## 2014-07-20 LAB — CBC WITH DIFFERENTIAL/PLATELET
BASOS PCT: 0.8 % (ref 0.0–3.0)
Basophils Absolute: 0 10*3/uL (ref 0.0–0.1)
EOS PCT: 2.8 % (ref 0.0–5.0)
Eosinophils Absolute: 0.1 10*3/uL (ref 0.0–0.7)
HEMATOCRIT: 37.8 % (ref 36.0–46.0)
Hemoglobin: 12.5 g/dL (ref 12.0–15.0)
LYMPHS ABS: 1.5 10*3/uL (ref 0.7–4.0)
LYMPHS PCT: 38.7 % (ref 12.0–46.0)
MCHC: 33.1 g/dL (ref 30.0–36.0)
MCV: 84.7 fl (ref 78.0–100.0)
MONOS PCT: 11.1 % (ref 3.0–12.0)
Monocytes Absolute: 0.4 10*3/uL (ref 0.1–1.0)
NEUTROS ABS: 1.8 10*3/uL (ref 1.4–7.7)
Neutrophils Relative %: 46.6 % (ref 43.0–77.0)
Platelets: 152 10*3/uL (ref 150.0–400.0)
RBC: 4.47 Mil/uL (ref 3.87–5.11)
RDW: 15 % (ref 11.5–15.5)
WBC: 3.9 10*3/uL — ABNORMAL LOW (ref 4.0–10.5)

## 2014-07-20 MED ORDER — PEG-KCL-NACL-NASULF-NA ASC-C 100 G PO SOLR: 1.0000 | Freq: Once | ORAL | Status: DC

## 2014-07-20 NOTE — Progress Notes (Signed)
Colonoscopy Dr. Velora Heckler 2006 done for "surveillance of adenomatous polyps" found diverticulosis, otherwise normal. He recommended she have repeat colonoscopy "every 5 years"   HPI: This is a very pleasant 74 year old woman    who was referred to me by Colon Branch, MD  to evaluate  rectal bleeding .    2006 colonoscopy was her 2nd or third colonoscopy.  Chief complaint is rectal bleeding. This has been going on for several months.   Only blood on TP with wiping.  Minor anal discomfort.  She has been having several months lof loose stools up to 3 times a day. Loose+.  Can be nocturnal.  Her weight is up recently.  She is on plavix.  Due to TIAs in the past. Dr. Larose Kells.  For about 2 years.  She has not stopped the meds.  Her appetite is poor.  Review of systems: Pertinent positive and negative review of systems were noted in the above HPI section. Complete review of systems was performed and was otherwise normal.   Past Medical History  Diagnosis Date  . OSA (obstructive sleep apnea) 09/2007    dx w/ a sleep study, not on  CPAP  . Hyperlipidemia   . Hypertension   . Chest pain, atypical 11/2006    saw cards: neg/normal Holter, ECHo and myoview, admitted w/ CP 4/09 neg enz.and CT chest had a 9 beat NSVT  . Fibromyalgia   . Abnormal CT of the chest 2008    last CT4-l 2009:  . No f/u suggested   . 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 Shingles 11/2009  . Vaginal dysplasia   . Vaginal cancer 1994  . Type II diabetes mellitus   . Heart murmur   . CHF (congestive heart failure)   . Pneumonia     "double" (06/27/2012)  . Asthma   . H/O hiatal hernia   . GERD (gastroesophageal reflux disease)   . Ocular migraine   . TIA (transient ischemic attack) 06/27/2012    "this is my first" (06/27/2012)  . Rheumatoid factor positive   . Osteoarthritis   . Reactive airway disease 01/29/2002    dx of pseudoasthma / vcd in 2005 and nl sprirometry History of dyspnea, 2011,  improved  after several medications were changed around Question of COPD, disproved July 06, 2009 with nl pft's      . TIA (transient ischemic attack)     x2    Past Surgical History  Procedure Laterality Date  . Thyroidectomy, partial  1960's  . Bunionectomy Left ~ 1977  . Breast biopsy Right 1999  . Anterior cervical decomp/discectomy fusion  2001  . Cataract extraction w/ intraocular lens  implant, bilateral  2012  . Vaginal mass excision  1994    "Laser surgery for vaginal cancer; followed by chemotherapy" (06/27/2012)  . Abdominal hysterectomy  1980    NO oophorectomy per pt     Current Outpatient Prescriptions  Medication Sig Dispense Refill  . albuterol (PROVENTIL HFA;VENTOLIN HFA) 108 (90 BASE) MCG/ACT inhaler Inhale 2 puffs into the lungs every 6 (six) hours as needed for wheezing or shortness of breath.    [name] atorvastatin (LIPITOR) 10 MG tablet Take 1 tablet (10 mg total) by mouth daily. 90 tablet 1  . butalbital-acetaminophen-caffeine (FIORICET, ESGIC) 50-325-40 MG per tablet As needed for migraine 15 tablet 3  . BYSTOLIC 10 MG tablet TAKE 2 TABLETS BY MOUTH IN THE MORNING AND 1 TABLET IN  THE EVENING SPACE 12 HOURS APART 90 tablet 6  . CALCIUM PO Take 1 tablet by mouth daily.      . cetirizine (ZYRTEC) 10 MG tablet Take 1 tablet (10 mg total) by mouth daily. 90 tablet 1  . cholecalciferol (VITAMIN D) 1000 UNITS tablet Take 1,000 Units by mouth daily.    . clopidogrel (PLAVIX) 75 MG tablet Take 1 tablet (75 mg total) by mouth daily with breakfast. 90 tablet 3  . cyclobenzaprine (FLEXERIL) 10 MG tablet Take 1 tablet (10 mg total) by mouth 2 (two) times daily as needed for muscle spasms. 30 tablet 6  . DULoxetine (CYMBALTA) 60 MG capsule TAKE 1 CAPSULE (60 MG TOTAL) BY MOUTH DAILY. 30 capsule 11  . fluticasone (FLONASE) 50 MCG/ACT nasal spray Place 2 sprays into the nose as needed for rhinitis.    . furosemide (LASIX) 20 MG tablet Take 1 tablet (20 mg total) by mouth 2 (two) times daily.  30 tablet 6  . hydrALAZINE (APRESOLINE) 25 MG tablet Take 1 tablet (25 mg total) by mouth 3 (three) times daily. 270 tablet 6  . lisinopril (PRINIVIL,ZESTRIL) 40 MG tablet Take 1 tablet (40 mg total) by mouth daily. 90 tablet 1  . metFORMIN (GLUCOPHAGE-XR) 500 MG 24 hr tablet Take 1 tablet (500 mg total) by mouth daily with breakfast. 90 tablet 1  . Misc Natural Products (OSTEO BI-FLEX JOINT SHIELD) TABS Take 1 tablet by mouth daily.    . Multiple Vitamin (MULTIVITAMIN) capsule Take 1 capsule by mouth daily.      . ondansetron (ZOFRAN ODT) 4 MG disintegrating tablet Take 1 tablet (4 mg total) by mouth every 8 (eight) hours as needed for nausea or vomiting. 12 tablet 1  . Polyethyl Glycol-Propyl Glycol (SYSTANE) 0.4-0.3 % GEL Apply 1 drop to eye daily as needed (dry eyes).    . Potassium 99 MG TABS Take 99 mg by mouth daily.     . pregabalin (LYRICA) 100 MG capsule Take 1 capsule (100 mg total) by mouth at bedtime as needed. 30 capsule 6  . ranitidine (ZANTAC) 300 MG tablet Take 1 tablet (300 mg total) by mouth at bedtime. 90 tablet 1   No current facility-administered medications for this visit.    Allergies as of 07/20/2014 - Review Complete 07/20/2014  Allergen Reaction Noted  . Cefuroxime axetil  02/07/2006  . Oxycodone Nausea And Vomiting 12/07/2010  . Seldane [terfenadine]  02/07/2006  . Zocor [simvastatin]  01/23/2013  . Tramadol  10/05/2009  . Bactrim [sulfamethoxazole-trimethoprim]  09/15/2013  . Pravastatin  04/02/2013    Family History  Problem Relation Age of Onset  . Allergies Sister   . Parkinsonism Sister     possible  . Asthma Sister   . Asthma Paternal Grandmother   . Heart disease Father   . Heart disease Mother   . Lung cancer Mother   . Heart disease      paternal grandparents, maternal grandparents,   . Heart disease Brother   . Emphysema Brother   . Aneurysm Brother     x3  . Kidney failure Brother   . Diabetes Brother   . Breast cancer Neg Hx   .  Colon cancer Neg Hx   . Diabetes Brother   . Heart attack Neg Hx   . Stroke Brother   . Stroke Maternal Grandmother   . Stroke Paternal Grandmother     History   Social History  . Marital Status: Married    Spouse Name:  Bernard  . Number of Children: 2  . Years of Education: masters   Occupational History  . Retired, disable since 2000    Social History Main Topics  . Smoking status: Former Smoker    Types: Cigarettes  . Smokeless tobacco: Never Used     Comment: Quit in 2001  . Alcohol Use: No  . Drug Use: No  . Sexual Activity: No   Other Topics Concern  . Not on file   Social History Narrative   On disability since 2000--- also husband has MS   Education. College.   Right handed.     Physical Exam: BP 112/66 mmHg  Pulse 74  Ht 5\' 2"  (1.575 m)  Wt 193 lb 4 oz (87.658 kg)  BMI 35.34 kg/m2 Constitutional: generally well-appearing Psychiatric: alert and oriented x3 Eyes: extraocular movements intact Mouth: oral pharynx moist, no lesions Neck: supple no lymphadenopathy Cardiovascular: heart regular rate and rhythm Lungs: clear to auscultation bilaterally Abdomen: soft, nontender, nondistended, no obvious ascites, no peritoneal signs, normal bowel sounds Extremities: no lower extremity edema bilaterally Skin: no lesions on visible extremities   Assessment and plan: 74 y.o. female with  minor intermittent rectal bleeding, looser than usual stools,  I suspect her minor rectal bleeding is anal rectal in origin, likely benign. She tells me she has had precancerous polyps in the past. I do not have pathology dating back prior to her 2006 colonoscopy which was essentially normal. I do think she needs repeat colonoscopy at this time, it has been 10 years since her last one and she has new symptom of bleeding. She will have basic labs including a CBC to see if she is anemic as well.   Owens Loffler, MD [facility] 07/20/2014, 2:31 PM  Cc: Colon Branch, MD

## 2014-07-20 NOTE — Telephone Encounter (Signed)
Patient notified to come off Plavix 5 days before her procedure per Dr. Larose Kells. Pt verbalized understanding.

## 2014-07-20 NOTE — Telephone Encounter (Signed)
07/20/2014   RE: Samantha [name] DOB: 1940/07/29 MRN: [id number]   Dear Dr. Larose Kells,    We have scheduled the above patient for an endoscopic procedure. Our records show that she is on anticoagulation therapy.   Please advise as to how long the patient may come off her therapy of Plavix prior to the procedure, which is scheduled for 08/27/2014.  Please fax back/ or route the completed form to [facility].  Sincerely,    Margreta Journey

## 2014-07-20 NOTE — Patient Instructions (Addendum)
You will have labs checked today in the basement lab.  Please head down after you check out with the front desk  (cbc). You will be set up for a colonoscopy for rectal bleeding.   We will check with Dr. Larose Kells to make sure it is OK that you hold plavix for 5 days prior to the colonoscopy.  You will be contacted by our office prior to your procedure for directions on holding your Plavix.  If you do not hear from our office 1 week prior to your scheduled procedure, please call [phone] to discuss.

## 2014-07-20 NOTE — Telephone Encounter (Signed)
Chart reviewed, history of possible TIA  May 2014, asymptomatic since. Okay to hold Plavix as requested by GI

## 2014-07-26 ENCOUNTER — Other Ambulatory Visit: Payer: Self-pay

## 2014-08-03 ENCOUNTER — Other Ambulatory Visit: Payer: Self-pay | Admitting: Internal Medicine

## 2014-08-26 ENCOUNTER — Telehealth: Payer: Self-pay | Admitting: Cardiology

## 2014-08-26 NOTE — Telephone Encounter (Signed)
New message      Pt is having a colonscopy tomorrow.  She is reading over the instructions.  Pt has an irr heartbeat.  Did Dr Ardis Hughs' s office call to get clearance for pt to have colonscopy?

## 2014-08-26 NOTE — Telephone Encounter (Signed)
Called patient back and told her that Dr. Eugenia Pancoast office has not called.  Per last office visit with [name] she noted PVC's.

## 2014-08-27 ENCOUNTER — Encounter: Payer: Medicare Other | Admitting: Gastroenterology

## 2014-08-27 ENCOUNTER — Ambulatory Visit (AMBULATORY_SURGERY_CENTER): Payer: Medicare Other | Admitting: Gastroenterology

## 2014-08-27 ENCOUNTER — Encounter: Payer: Self-pay | Admitting: Gastroenterology

## 2014-08-27 VITALS — BP 123/71 | HR 65 | Temp 97.5°F | Resp 15 | Ht 62.0 in | Wt 193.0 lb

## 2014-08-27 DIAGNOSIS — K623 Rectal prolapse: Secondary | ICD-10-CM | POA: Diagnosis not present

## 2014-08-27 DIAGNOSIS — M797 Fibromyalgia: Secondary | ICD-10-CM | POA: Diagnosis not present

## 2014-08-27 DIAGNOSIS — K5730 Diverticulosis of large intestine without perforation or abscess without bleeding: Secondary | ICD-10-CM | POA: Diagnosis not present

## 2014-08-27 DIAGNOSIS — K625 Hemorrhage of anus and rectum: Secondary | ICD-10-CM | POA: Diagnosis not present

## 2014-08-27 DIAGNOSIS — D120 Benign neoplasm of cecum: Secondary | ICD-10-CM

## 2014-08-27 DIAGNOSIS — Z8673 Personal history of transient ischemic attack (TIA), and cerebral infarction without residual deficits: Secondary | ICD-10-CM | POA: Diagnosis not present

## 2014-08-27 DIAGNOSIS — J45909 Unspecified asthma, uncomplicated: Secondary | ICD-10-CM | POA: Diagnosis not present

## 2014-08-27 DIAGNOSIS — E119 Type 2 diabetes mellitus without complications: Secondary | ICD-10-CM | POA: Diagnosis not present

## 2014-08-27 DIAGNOSIS — I509 Heart failure, unspecified: Secondary | ICD-10-CM | POA: Diagnosis not present

## 2014-08-27 DIAGNOSIS — I10 Essential (primary) hypertension: Secondary | ICD-10-CM | POA: Diagnosis not present

## 2014-08-27 DIAGNOSIS — C180 Malignant neoplasm of cecum: Secondary | ICD-10-CM | POA: Diagnosis not present

## 2014-08-27 LAB — GLUCOSE, CAPILLARY: Glucose-Capillary: 92 mg/dL (ref 65–99)

## 2014-08-27 MED ORDER — SODIUM CHLORIDE 0.9 % IV SOLN: 500.0000 mL | INTRAVENOUS | Status: DC

## 2014-08-27 NOTE — Progress Notes (Signed)
Report to PACU, RN, vss, BBS= Clear.  

## 2014-08-27 NOTE — Progress Notes (Signed)
Called to room to assist during endoscopic procedure.  Patient ID and intended procedure confirmed with present staff. Received instructions for my participation in the procedure from the performing physician.  

## 2014-08-27 NOTE — Op Note (Signed)
[facility]  [address] [place], [id number]   COLONOSCOPY PROCEDURE REPORT  PATIENT: Samantha [name], Samantha [name]  MR#: [id number] BIRTHDATE: 1940/11/26 , 54  yrs. old GENDER: female ENDOSCOPIST: Milus Banister, MD PROCEDURE DATE:  08/27/2014 PROCEDURE:   Colonoscopy, diagnostic, Colonoscopy with biopsy, and Colonoscopy with snare polypectomy First Screening Colonoscopy - Avg.  risk and is 50 yrs.  old or older - No.  Prior Negative Screening - Now for repeat screening. 10 or more years since last screening  History of Adenoma - Now for follow-up colonoscopy & has been > or = to 3 yrs.  N/A  Polyps removed today? Yes ASA CLASS:   Class II INDICATIONS:minor rectal bleeding (not anemic, on plavix). MEDICATIONS: Monitored anesthesia care and Propofol 200 mg IV  DESCRIPTION OF PROCEDURE:   After the risks benefits and alternatives of the procedure were thoroughly explained, informed consent was obtained.  The digital rectal exam revealed no abnormalities of the rectum.   The LB [id number]  endoscope was introduced through the anus and advanced to the cecum, which was identified by both the appendix and ileocecal valve. No adverse events experienced.   The quality of the prep was excellent.  The instrument was then slowly withdrawn as the colon was fully examined. Estimated blood loss is zero unless otherwise noted in this procedure report.   COLON FINDINGS: Adjacent to the IC valve, in ascending colon there was a flat polypoid lesion that was somewhat firm and measured 1cm across.  This ws removed with snare/cautery and sent to pathology (jar 1).  There were numerous diverticulum in the left colon.  The very distal rectum (perianal mucosa) was slightly inflammed appearing.  This was not neoplastic appearing, was biopsied and sent to pathology.  The examination was otherwise normal. Retroflexed views revealed no abnormalities. The time to cecum = 4.4 Withdrawal  time = 7.8   The scope was withdrawn and the procedure completed. COMPLICATIONS: There were no immediate complications.  ENDOSCOPIC IMPRESSION: Adjacent to the IC valve, in ascending colon there was a flat polypoid lesion that was somewhat firm and measured 1cm across. This ws removed with snare/cautery and sent to pathology (jar 1). There were numerous diverticulum in the left colon.  The very distal rectum (perianal mucosa) was slightly inflammed appearing. This was not neoplastic appearing, was biopsied and sent to pathology.  The examination was otherwise normal  RECOMMENDATIONS: Await final pathology. You can resume plavix tomorrow.  eSigned:  Milus Banister, MD 08/27/2014 3:03 PM   cc: Kathlene November, MD

## 2014-08-27 NOTE — Patient Instructions (Signed)
YOU HAD AN ENDOSCOPIC PROCEDURE TODAY AT [facility]:   Refer to the procedure report that was given to you for any specific questions about what was found during the examination.  If the procedure report does not answer your questions, please call your gastroenterologist to clarify.  If you requested that your care partner not be given the details of your procedure findings, then the procedure report has been included in a sealed envelope for you to review at your convenience later.  YOU SHOULD EXPECT: Some feelings of bloating in the abdomen. Passage of more gas than usual.  Walking can help get rid of the air that was put into your GI tract during the procedure and reduce the bloating. If you had a lower endoscopy (such as a colonoscopy or flexible sigmoidoscopy) you may notice spotting of blood in your stool or on the toilet paper. If you underwent a bowel prep for your procedure, you may not have a normal bowel movement for a few days.  Please Note:  You might notice some irritation and congestion in your nose or some drainage.  This is from the oxygen used during your procedure.  There is no need for concern and it should clear up in a day or so.  SYMPTOMS TO REPORT IMMEDIATELY:   Following lower endoscopy (colonoscopy or flexible sigmoidoscopy):  Excessive amounts of blood in the stool  Significant tenderness or worsening of abdominal pains  Swelling of the abdomen that is new, acute  Fever of 100F or higher   For urgent or emergent issues, a gastroenterologist can be reached at any hour by calling [phone].   DIET: Your first meal following the procedure should be a small meal and then it is ok to progress to your normal diet. Heavy or fried foods are harder to digest and may make you feel nauseous or bloated.  Likewise, meals heavy in dairy and vegetables can increase bloating.  Drink plenty of fluids but you should avoid alcoholic beverages for 24  hours.  ACTIVITY:  You should plan to take it easy for the rest of today and you should NOT DRIVE or use heavy machinery until tomorrow (because of the sedation medicines used during the test).    FOLLOW UP: Our staff will call the number listed on your records the next business day following your procedure to check on you and address any questions or concerns that you may have regarding the information given to you following your procedure. If we do not reach you, we will leave a message.  However, if you are feeling well and you are not experiencing any problems, there is no need to return our call.  We will assume that you have returned to your regular daily activities without incident.  If any biopsies were taken you will be contacted by phone or by letter within the next 1-3 weeks.  Please call us at [phone] if you have not heard about the biopsies in 3 weeks.    SIGNATURES/CONFIDENTIALITY: You and/or your care partner have signed paperwork which will be entered into your electronic medical record.  These signatures attest to the fact that that the information above on your After Visit Summary has been reviewed and is understood.  Full responsibility of the confidentiality of this discharge information lies with you and/or your care-partner.   Await pathology report Resume Plavix tomorrow

## 2014-08-30 ENCOUNTER — Telehealth: Payer: Self-pay

## 2014-08-30 LAB — GLUCOSE, CAPILLARY: Glucose-Capillary: 76 mg/dL (ref 65–99)

## 2014-08-30 MED ORDER — MESALAMINE 1000 MG RE SUPP: 1000.0000 mg | Freq: Every day | RECTAL | Status: DC

## 2014-08-30 NOTE — Telephone Encounter (Signed)
We spoke, still with minor bleeding on TP with BMs. She is concerned since it's not better since colonoscopy.  I explained there was what appeared to be mild inflammation in distal rectum, awaiting final pathology. For now I called in canasa suppository that she will begin taking once daily.

## 2014-08-30 NOTE — Telephone Encounter (Signed)
  Follow up Call-  Call back number 08/27/2014  Post procedure Call Back phone  # [phone]  Permission to leave phone message Yes     Patient questions:  Do you have a fever, pain , or abdominal swelling? No. Pain Score  0 *  Have you tolerated food without any problems? Yes.    Have you been able to return to your normal activities? Yes.    Do you have any questions about your discharge instructions: Diet   No. Medications  No. Follow up visit  No.  Do you have questions or concerns about your Care? Yes.    Actions: * If pain score is 4 or above: Physician/ provider Notified : Owens Loffler, MD  Samantha [name] said rectal bleeding has gotten heavier since procedure on Friday. She is seeing blood on the tissue and in the commode each time she has a bowel movement. She is not in any pain and is able to eat and drink. She would like a call back.

## 2014-09-02 ENCOUNTER — Other Ambulatory Visit: Payer: Self-pay

## 2014-09-02 DIAGNOSIS — C189 Malignant neoplasm of colon, unspecified: Secondary | ICD-10-CM

## 2014-09-02 NOTE — Progress Notes (Signed)
You have been scheduled for a CT scan of the abdomen and pelvis at [facility] ([address]---this is in the same building as [facility]).   You are scheduled on 09/03/14 at 330 pm. You should arrive 15 minutes prior to your appointment time for registration. Please follow the written instructions below on the day of your exam:  WARNING: IF YOU ARE ALLERGIC TO IODINE/X-RAY DYE, PLEASE NOTIFY RADIOLOGY IMMEDIATELY AT [phone]! YOU WILL BE GIVEN A 13 HOUR PREMEDICATION PREP.  1) Do not eat or drink anything after 1130 am (4 hours prior to your test) 2) You have been given 2 bottles of oral contrast to drink. The solution may taste better if refrigerated, but do NOT add ice or any other liquid to this solution. Shake well before drinking.    Drink 1 bottle of contrast @ 130 pm (2 hours prior to your exam)  Drink 1 bottle of contrast @ 230 pm (1 hour prior to your exam)  You may take any medications as prescribed with a small amount of water except for the following: Metformin, Glucophage, Glucovance, Avandamet, Riomet, Fortamet, Actoplus Met, Janumet, Glumetza or Metaglip. The above medications must be held the day of the exam AND 48 hours after the exam.  The purpose of you drinking the oral contrast is to aid in the visualization of your intestinal tract. The contrast solution may cause some diarrhea. Before your exam is started, you will be given a small amount of fluid to drink. Depending on your individual set of symptoms, you may also receive an intravenous injection of x-ray contrast/dye. Plan on being at [facility] for 30 minutes or long, depending on the type of exam you are having performed.  This test typically takes 30-45 minutes to complete.  If you have any questions regarding your exam or if you need to reschedule, you may call the CT department at [phone] between the hours of 8:00 am and 5:00 pm,  Monday-Friday.  ________________________________________________________________________ Please have labs today or early tomorrow morning and you will also need to pick up contrast to drink today or early tomorrow.   Dr Marcello Moores appt at [facility] is 09/07/14 arrive at 9:10 am.  The pt has been advised to come in and have labs and pick up contrast.  Pt was also given the appt for Dr Marcello Moores.  She will call with any questions or concerns

## 2014-09-03 ENCOUNTER — Ambulatory Visit (INDEPENDENT_AMBULATORY_CARE_PROVIDER_SITE_OTHER)
Admission: RE | Admit: 2014-09-03 | Discharge: 2014-09-03 | Disposition: A | Discharge status: Home / Self Care | Payer: Medicare Other | Source: Ambulatory Visit | Attending: Gastroenterology | Admitting: Gastroenterology

## 2014-09-03 ENCOUNTER — Other Ambulatory Visit (INDEPENDENT_AMBULATORY_CARE_PROVIDER_SITE_OTHER): Payer: Medicare Other

## 2014-09-03 ENCOUNTER — Other Ambulatory Visit: Payer: Self-pay

## 2014-09-03 DIAGNOSIS — I700 Atherosclerosis of aorta: Secondary | ICD-10-CM | POA: Diagnosis not present

## 2014-09-03 DIAGNOSIS — K5730 Diverticulosis of large intestine without perforation or abscess without bleeding: Secondary | ICD-10-CM | POA: Diagnosis not present

## 2014-09-03 DIAGNOSIS — C189 Malignant neoplasm of colon, unspecified: Secondary | ICD-10-CM

## 2014-09-03 LAB — CREATININE, SERUM: CREATININE: 0.94 mg/dL (ref 0.40–1.20)

## 2014-09-03 LAB — BUN: BUN: 18 mg/dL (ref 6–23)

## 2014-09-03 MED ORDER — IOHEXOL 300 MG/ML  SOLN
100.0000 mL | Freq: Once | INTRAMUSCULAR | Status: AC | PRN
  Administered 2014-09-03: 100 mL via INTRAVENOUS

## 2014-09-04 LAB — CEA: CEA: <0.5 ng/mL (ref 0.0–5.0)

## 2014-09-07 ENCOUNTER — Telehealth: Payer: Self-pay | Admitting: Internal Medicine

## 2014-09-07 ENCOUNTER — Other Ambulatory Visit: Payer: Self-pay | Admitting: General Surgery

## 2014-09-07 DIAGNOSIS — C182 Malignant neoplasm of ascending colon: Secondary | ICD-10-CM | POA: Diagnosis not present

## 2014-09-07 NOTE — H&P (Signed)
Samantha [name] 09/07/2014 9:50 AM Location: [facility] Patient #: [id number] DOB: 1940-02-20 Married / Language: English / Race: Black or African American Female History of Present Illness [name] MD; 08/05/6765 10:40 AM) The patient is a 74 year old female who presents with colorectal cancer. 74 year old female who presents to the office as a referral from [name]. She underwent a colonoscopy due to rectal bleeding. A flat polypoid lesion was found adjacent to the IC valve. This measured approximately 1 cm across and was removed with snare cautery. 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 CT scans of chest abdomen and pelvis showed no signs of metastatic disease. Her CEA was less than 0.5. She has occasional right-sided abdominal pain. She has irregular bowel movements and loose stools. Other Problems Marjean Donna, CMA; 09/07/2014 9:50 AM) Arthritis Asthma Cancer Cerebrovascular Accident Chest pain Colon Cancer Congestive Heart Failure Diabetes Mellitus Gastroesophageal Reflux Disease Heart murmur High blood pressure Migraine Headache Sleep Apnea Vascular Disease  Past Surgical History Marjean Donna, CMA; 09/07/2014 9:50 AM) Breast Biopsy Right. Cataract Surgery Bilateral. Foot Surgery Left. Hysterectomy (not due to cancer) - Partial Oral Surgery Spinal Surgery - Neck Thyroid Surgery  Diagnostic Studies History Marjean Donna, CMA; 09/07/2014 9:50 AM) Colonoscopy within last year Mammogram within last year  Allergies Davy Pique Bynum, CMA; 09/07/2014 9:53 AM) OxyCODONE HCl (Abuse [place]eter) *ANALGESICS - OPIOID* Seldane *ANTIHISTAMINES* Zocor *ANTIHYPERLIPIDEMICS* TraMADol HCl *ANALGESICS - OPIOID* Bactrim *ANTI-INFECTIVE AGENTS - MISC.* Pravastatin Sodium *ANTIHYPERLIPIDEMICS*  Medication History (Sonya Bynum, CMA; 09/07/2014 9:54 AM) Atorvastatin Calcium (10MG  Tablet, Oral) Active. Lyrica (100MG  Capsule, Oral)  Active. Canasa (1000MG  Suppository, Rectal) Active. Bystolic (10MG  Tablet, Oral) Active. Cyclobenzaprine HCl (10MG  Tablet, Oral) Active. Lisinopril (40MG  Tablet, Oral) Active. MetFORMIN HCl ER (OSM) (500MG  Tablet ER 24HR, Oral) Active. Ranitidine HCl (300MG  Tablet, Oral) Active. Furosemide (20MG  Tablet, Oral) Active. Clopidogrel Bisulfate (75MG  Tablet, Oral) Active. DULoxetine HCl (60MG  Capsule DR Part, Oral) Active. Medications Reconciled  Social History Marjean Donna, CMA; 09/07/2014 9:50 AM) Caffeine use Coffee, Tea. No alcohol use No drug use Tobacco use Former smoker.  Family History Marjean Donna, [place]; 09/07/2014 9:50 AM) Arthritis Mother. Cerebrovascular Accident Family Members In General. Diabetes Mellitus Brother, Daughter, Mother. Heart Disease Brother, Father, Mother, Sister. Heart disease in female family member before age 22 Hypertension Daughter, Father, Mother. Kidney Disease Brother. Respiratory Condition Brother, Mother.  Pregnancy / Birth History Marjean Donna, [place]; 09/07/2014 9:50 AM) Age at menarche 78 years. Contraceptive History Oral contraceptives. Gravida 2 Maternal age 77-25 Para 2     Review of Systems ([place]; 09/07/2014 9:50 AM) General Present- Chills and Fatigue. Not Present- Appetite Loss, Fever, Night Sweats, Weight Gain and Weight Loss. Skin Not Present- Change in Wart/Mole, Dryness, Hives, Jaundice, New Lesions, Non-Healing Wounds, Rash and Ulcer. HEENT Present- Hoarseness and Sore Throat. Not Present- Earache, Hearing Loss, Nose Bleed, Oral Ulcers, Ringing in the Ears, Seasonal Allergies, Sinus Pain, Visual Disturbances, Wears glasses/contact lenses and Yellow Eyes. Respiratory Present- Chronic Cough, Difficulty Breathing and Wheezing. Not Present- Bloody sputum and Snoring. Cardiovascular Present- Chest Pain, Palpitations, Shortness of Breath and Swelling of Extremities. Not Present- Difficulty Breathing Lying Down,  Leg Cramps and Rapid Heart Rate. Gastrointestinal Present- Abdominal Pain, Bloating, Bloody Stool, Nausea and Rectal Pain. Not Present- Change in Bowel Habits, Chronic diarrhea, Constipation, Difficulty Swallowing, Excessive gas, Gets full quickly at meals, Hemorrhoids, Indigestion and Vomiting. Female Genitourinary Not Present- Frequency, Nocturia, Painful Urination, Pelvic Pain and Urgency. Musculoskeletal Present- Joint Pain. Not Present- Back Pain, Joint Stiffness, Muscle  Pain, Muscle Weakness and Swelling of Extremities. Neurological Present- Tingling. Not Present- Decreased Memory, Fainting, Headaches, Numbness, Seizures, Tremor, Trouble walking and Weakness. Psychiatric Not Present- Anxiety, Bipolar, Change in Sleep Pattern, Depression, Fearful and Frequent crying. Endocrine Not Present- Cold Intolerance, Excessive Hunger, Hair Changes, Heat Intolerance, Hot flashes and New Diabetes. Hematology Present- Easy Bruising and Excessive bleeding. Not Present- Gland problems, HIV and Persistent Infections.  Vitals (Sonya Bynum CMA; 09/07/2014 9:51 AM) 09/07/2014 9:50 AM Weight: 195 lb Height: 62in Body Surface Area: 1.97 m Body Mass Index: 35.67 kg/m Temp.: 98.70F(Temporal)  Pulse: 60 (Regular)  BP: 124/77 (Sitting, Left Arm, Standard)     Physical Exam [name] MD; 08/03/4490 10:40 AM)  General Mental Status-Alert. General Appearance-Consistent with stated age. Hydration-Well hydrated. Voice-Normal.  Head and Neck Head-normocephalic, atraumatic with no lesions or palpable masses. Trachea-midline. Thyroid Gland Characteristics - normal size and consistency.  Eye Eyeball - Bilateral-Extraocular movements intact. Sclera/Conjunctiva - Bilateral-No scleral icterus.  Chest and Lung Exam Chest and lung exam reveals -quiet, even and easy respiratory effort with no use of accessory muscles and on auscultation, normal breath sounds, no adventitious sounds  and normal vocal resonance. Inspection Chest Wall - Normal. Back - normal.  Cardiovascular Cardiovascular examination reveals -normal heart sounds, regular rate and rhythm with no murmurs and normal pedal pulses bilaterally.  Abdomen Inspection Inspection of the abdomen reveals - No Hernias. Palpation/Percussion Palpation and Percussion of the abdomen reveal - Soft, Non Tender, No Rebound tenderness, No Rigidity (guarding) and No hepatosplenomegaly. Auscultation Auscultation of the abdomen reveals - Bowel sounds normal.  Neurologic Neurologic evaluation reveals -alert and oriented x 3 with no impairment of recent or remote memory. Mental Status-Normal.  Musculoskeletal Global Assessment -Note:no gross deformities.  Normal Exam - Left-Upper Extremity Strength Normal and Lower Extremity Strength Normal. Normal Exam - Right-Upper Extremity Strength Normal and Lower Extremity Strength Normal.    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. We discussed these findings in detail. I have recommended a minimally invasive right hemicolectomy. All questions were answered. I will discuss this surgery with her primary care physician Dr. Larose Kells and her cardiologist [name] prior to scheduling. She will need to be off her Plavix approximately 5 days before surgery. The surgery and anatomy were described to the patient as well as the risks of surgery and the possible complications. These include: Bleeding, deep abdominal infections and possible wound complications such as hernia and infection, damage to adjacent structures, leak of surgical connections, which can lead to other surgeries and possibly an ostomy, possible need for  other procedures, such as abscess drains in radiology, possible prolonged hospital stay, possible diarrhea from removal of part of the colon, possible constipation from narcotics, possible bowel, bladder or sexual dysfunction if having rectal surgery, prolonged fatigue/weakness or appetite loss, possible early recurrence of of disease, possible complications of their medical problems such as heart disease or arrhythmias or lung problems, death (less than 1%). I believe the patient understands and wishes to proceed with the surgery.

## 2014-09-07 NOTE — Telephone Encounter (Signed)
Relation to pt: self Call back number: [phone]   Reason for call:  Patient was tested positive for cancer and Dr. Marcello Moores from [facility] will be contacting office regarding surgical clearance.

## 2014-09-07 NOTE — Telephone Encounter (Signed)
Pt will need a 30 minute appt for her surgical clearance and either bring the forms with her or have them faxed here before appt.

## 2014-09-08 ENCOUNTER — Telehealth: Payer: Self-pay | Admitting: *Deleted

## 2014-09-08 NOTE — Telephone Encounter (Signed)
[facility] sent a request note for the pt to have cardiac clearance from Dr Meda Coffee for robotic partial colectomy in the near future.  Per Dr Meda Coffee the pt has no contraindication from a cardiac standpoint to undergo planned surgery, for she had a recent negative stress test, and no further work-up needed.  Will fax this note back to [place] Mammie Lorenzo, LPN.   Fax number [phone]

## 2014-09-08 NOTE — Telephone Encounter (Signed)
lvm advising patient to schedule appointment °

## 2014-09-14 ENCOUNTER — Telehealth: Payer: Self-pay | Admitting: Cardiology

## 2014-09-14 NOTE — Telephone Encounter (Signed)
New message     Pt is being scheduled for surgery  Pt wanted to let you know that surgeon has faxed over surgical clearance paperwork

## 2014-09-14 NOTE — Telephone Encounter (Signed)
Attempted to call the pt back and both numbers unable to take calls at this time.  Will look-out for surgical clearance letter and give to Dr Meda Coffee to advise on once reviewed.  Will follow-up with the pt and Surgeon's office accordingly.

## 2014-09-15 NOTE — Telephone Encounter (Signed)
Informed the pt that correction made in regards to her surgical clearance note sent to Dr Marcello Moores at [facility], was done on 09/08/14.  Informed the pt that Dr Meda Coffee did clear her to have her upcoming robotic partial colectomy. Below reads exact encounter made on 09/08/14 in regards to clearance note faxed to [facility], per Dr Meda Coffee.   [facility] sent a request note for the pt to have cardiac clearance from Dr Meda Coffee for robotic partial colectomy in the near future.  Per Dr Meda Coffee the pt has no contraindication from a cardiac standpoint to undergo planned surgery, for she had a recent negative stress test, and no further work-up needed.  Will fax this note back to [place] Mammie Lorenzo, LPN. Fax number [phone]  Informed the pt that I will route Dr [name], General Surgeon, who will be performing the pts surgery, this message on noted telephone encounter from 8/10 in regards to clearance per Dr Meda Coffee through epic.  Advised the pt to follow-up with our office if anymore issues occur with this clearance. Pt verbalized understanding and agrees with this plan.

## 2014-09-17 ENCOUNTER — Encounter: Payer: Self-pay | Admitting: Internal Medicine

## 2014-09-17 ENCOUNTER — Ambulatory Visit (HOSPITAL_BASED_OUTPATIENT_CLINIC_OR_DEPARTMENT_OTHER)
Admission: RE | Admit: 2014-09-17 | Discharge: 2014-09-17 | Disposition: A | Discharge status: Home / Self Care | Payer: Medicare Other | Source: Ambulatory Visit | Attending: Internal Medicine | Admitting: Internal Medicine

## 2014-09-17 ENCOUNTER — Ambulatory Visit (INDEPENDENT_AMBULATORY_CARE_PROVIDER_SITE_OTHER): Payer: Medicare Other | Admitting: Internal Medicine

## 2014-09-17 VITALS — BP 132/80 | HR 68 | Temp 98.1°F | Ht 62.0 in | Wt 192.5 lb

## 2014-09-17 DIAGNOSIS — R1310 Dysphagia, unspecified: Secondary | ICD-10-CM

## 2014-09-17 DIAGNOSIS — Z01818 Encounter for other preprocedural examination: Secondary | ICD-10-CM | POA: Insufficient documentation

## 2014-09-17 DIAGNOSIS — N390 Urinary tract infection, site not specified: Secondary | ICD-10-CM | POA: Diagnosis not present

## 2014-09-17 DIAGNOSIS — M542 Cervicalgia: Secondary | ICD-10-CM

## 2014-09-17 DIAGNOSIS — R109 Unspecified abdominal pain: Secondary | ICD-10-CM | POA: Diagnosis not present

## 2014-09-17 DIAGNOSIS — R1030 Lower abdominal pain, unspecified: Secondary | ICD-10-CM

## 2014-09-17 NOTE — Progress Notes (Signed)
Pre visit review using our clinic review tool, if applicable. No additional management support is needed unless otherwise documented below in the visit note. 

## 2014-09-17 NOTE — Patient Instructions (Signed)
   Stop by the first floor and get the XR   Will refer you to ENT

## 2014-09-17 NOTE — Progress Notes (Signed)
Subjective:    Patient ID: Samantha [name], female    DOB: [birth year], 74 y.o.   MRN: [id number]  DOS:  09/17/2014 Type of visit - description : here for a surgical clearance Interval history: Since the last visit, she was found to have a small colon cancer, needs surgery, already cleared by cardiology.  Medication list reviewed, good compliance. recent labs showing a normal creatinine, no anemia, normal LFTs Also, complaining of neck pain for at least 4 months: Pain is located at the "glands" bilaterally anteriorly, no swelling, on and off, not changed by swallowing.   Review of Systems On doing the review of systems, she had multiple + responses as follows:  Reports occasional nausea and lower abdominal pain for 2 months, occasional diarrhea,continue with occasionally red blood when she wipes. Occasionally has difficulty swallowing, since 2001 when she had neck surgery?. + hoarseness, ongoing problem but worse lately.  No dysuria, gross motor or difficulty urinating  No recent chest pain, occasionally difficulty breathing, not a new symptom. Occasional lower extremity edema, usually controlled with Lasix Sleeps on 3 pillows, not a new issue.  Mild cough, only lately, no wheezing, no sputum production.     Past Medical History  Diagnosis Date  . OSA (obstructive sleep apnea) 09/2007    dx w/ a sleep study, not on  CPAP  . Hyperlipidemia   . Hypertension   . Chest pain, atypical 11/2006    saw cards: neg/normal Holter, ECHo and myoview, admitted w/ CP 4/09 neg enz.and CT chest had a 9 beat NSVT  . Fibromyalgia   . Abnormal CT of the chest 2008    last CT4-l 2009:  . No f/u suggested   . 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 Shingles 11/2009  . Vaginal dysplasia   . Vaginal cancer 1994  . Type II diabetes mellitus   . Heart murmur   . CHF (congestive heart failure)   . Pneumonia     "double" (06/27/2012)  . Asthma   . H/O hiatal hernia   . GERD  (gastroesophageal reflux disease)   . Ocular migraine   . TIA (transient ischemic attack) 06/27/2012    "this is my first" (06/27/2012)  . Rheumatoid factor positive   . Osteoarthritis   . Reactive airway disease 01/29/2002    dx of pseudoasthma / vcd in 2005 and nl sprirometry History of dyspnea, 2011,  improved after several medications were changed around Question of COPD, disproved July 06, 2009 with nl pft's      . TIA (transient ischemic attack)     x2  . Malignant neoplasm of ascending colon 2016    Minimally invasive right hemicolectomy    Past Surgical History  Procedure Laterality Date  . Thyroidectomy, partial  1960's  . Bunionectomy Left ~ 1977  . Breast biopsy Right 1999  . Anterior cervical decomp/discectomy fusion  2001  . Cataract extraction w/ intraocular lens  implant, bilateral  2012  . Vaginal mass excision  1994    "Laser surgery for vaginal cancer; followed by chemotherapy" (06/27/2012)  . Abdominal hysterectomy  1980    NO oophorectomy per pt     Social History   Social History  . Marital Status: Married    Spouse Name: Ilona Sorrel  . Number of Children: 2  . Years of Education: masters   Occupational History  . Retired, disable since 2000    Social History Main Topics  .  Smoking status: Former Smoker    Types: Cigarettes  . Smokeless tobacco: Never Used     Comment: Quit in 2001  . Alcohol Use: No  . Drug Use: No  . Sexual Activity: No   Other Topics Concern  . Not on file   Social History Narrative   On disability since 2000--- also husband has MS   Education. College.   Right handed.        Medication List       This list is accurate as of: 09/17/14 11:59 PM.  Always use your most recent med list.               albuterol 108 (90 BASE) MCG/ACT inhaler  Commonly known as:  PROVENTIL HFA;VENTOLIN HFA  Inhale 2 puffs into the lungs every 6 (six) hours as needed for wheezing or shortness of breath.     atorvastatin 10 MG tablet    Commonly known as:  LIPITOR  Take 1 tablet (10 mg total) by mouth daily.     butalbital-acetaminophen-caffeine 50-325-40 MG per tablet  Commonly known as:  FIORICET, ESGIC  As needed for migraine     BYSTOLIC 10 MG tablet  Generic drug:  nebivolol  TAKE 2 TABLETS BY MOUTH IN THE MORNING AND 1 TABLET IN THE EVENING SPACE 12 HOURS APART     CALCIUM PO  Take 1 tablet by mouth daily.     cetirizine 10 MG tablet  Commonly known as:  ZYRTEC  Take 1 tablet (10 mg total) by mouth daily.     cholecalciferol 1000 UNITS tablet  Commonly known as:  VITAMIN D  Take 1,000 Units by mouth daily.     clopidogrel 75 MG tablet  Commonly known as:  PLAVIX  Take 1 tablet (75 mg total) by mouth daily with breakfast.     DULoxetine 60 MG capsule  Commonly known as:  CYMBALTA  TAKE 1 CAPSULE (60 MG TOTAL) BY MOUTH DAILY.     fluticasone 50 MCG/ACT nasal spray  Commonly known as:  FLONASE  Place 2 sprays into the nose as needed for rhinitis.     furosemide 20 MG tablet  Commonly known as:  LASIX  Take 1 tablet (20 mg total) by mouth 2 (two) times daily.     hydrALAZINE 25 MG tablet  Commonly known as:  APRESOLINE  Take 1 tablet (25 mg total) by mouth 3 (three) times daily.     lisinopril 40 MG tablet  Commonly known as:  PRINIVIL,ZESTRIL  Take 1 tablet (40 mg total) by mouth daily.     mesalamine 1000 MG suppository  Commonly known as:  CANASA  Place 1 suppository (1,000 mg total) rectally at bedtime.     metFORMIN 500 MG 24 hr tablet  Commonly known as:  GLUCOPHAGE-XR  Take 1 tablet (500 mg total) by mouth daily with breakfast.     multivitamin capsule  Take 1 capsule by mouth daily.     ondansetron 4 MG disintegrating tablet  Commonly known as:  ZOFRAN ODT  Take 1 tablet (4 mg total) by mouth every 8 (eight) hours as needed for nausea or vomiting.     OSTEO BI-FLEX JOINT SHIELD Tabs  Take 1 tablet by mouth daily.     Potassium 99 MG Tabs  Take 99 mg by mouth daily.      pregabalin 100 MG capsule  Commonly known as:  LYRICA  Take 1 capsule (100 mg total) by mouth at bedtime as  needed.     ranitidine 300 MG tablet  Commonly known as:  ZANTAC  Take 1 tablet (300 mg total) by mouth at bedtime.     SYSTANE 0.4-0.3 % Gel  Generic drug:  Polyethyl Glycol-Propyl Glycol  Apply 1 drop to eye daily as needed (dry eyes).           Objective:   Physical Exam BP 132/80 mmHg  Pulse 68  Temp(Src) 98.1 F (36.7 C) (Oral)  Ht 5\' 2"  (1.575 m)  Wt 192 lb 8 oz (87.317 kg)  BMI 35.20 kg/m2  SpO2 98% General:   Well developed, well nourished . NAD.  HEENT:  Normocephalic . Face symmetric, atraumatic. No mass or LAD.  Neck: No jVD at 45 Lungs:  CTA B Normal respiratory effort, no intercostal retractions, no accessory muscle use. Heart: RRR,  no murmur.  no pretibial edema bilaterally  Abdomen:  Not distended, soft, non-tender. No rebound or rigidity. No mass,organomegaly Skin: Not pale. Not jaundice Neurologic:  alert & oriented X3.  Speech normal, gait appropriate for age and unassisted Psych--  Cognition and judgment appear intact.  Cooperative with normal attention span and concentration.  Behavior appropriate. No anxious or depressed appearing.    Assessment & Plan:   surgical clearance: Already clear by cardiology, she denies chest pain,shortness of breath is at baseline, no evidence of vol overload on exam. She however has a multitude of other symptoms. I am somewhat concerned about the difficulty swallowing, hoarseness, anterior neck pain. Plan: ENT referral before she is clear she also has a dry cough, check a chest x-ray.  Colon cancer: Found to have a colon cancer recent colonoscopy, did complain about diarrhea to her gastroenterologist but today she also reports a two-month history of lower abdominal discomfort.bdominal exam is benign. No anemia. Plan: Check a UA

## 2014-09-18 LAB — URINALYSIS, ROUTINE W REFLEX MICROSCOPIC
Bilirubin Urine: NEGATIVE
Glucose, UA: NEGATIVE
Hgb urine dipstick: NEGATIVE
KETONES UR: NEGATIVE
Nitrite: POSITIVE — AB
PH: 5.5 (ref 5.0–8.0)
Protein, ur: NEGATIVE
Specific Gravity, Urine: 1.018 (ref 1.001–1.035)

## 2014-09-18 LAB — URINALYSIS, MICROSCOPIC ONLY
CASTS: NONE SEEN [LPF]
Crystals: NONE SEEN [HPF]
Squamous Epithelial / LPF: NONE SEEN [HPF] (ref <=5)
Yeast: NONE SEEN [HPF]

## 2014-09-21 NOTE — Addendum Note (Signed)
Addended by: Wilfrid Lund on: 09/21/2014 10:08 AM   Modules accepted: Orders

## 2014-09-28 DIAGNOSIS — R070 Pain in throat: Secondary | ICD-10-CM | POA: Diagnosis not present

## 2014-09-28 DIAGNOSIS — R1313 Dysphagia, pharyngeal phase: Secondary | ICD-10-CM | POA: Diagnosis not present

## 2014-09-30 ENCOUNTER — Other Ambulatory Visit: Payer: Medicare Other

## 2014-09-30 DIAGNOSIS — N390 Urinary tract infection, site not specified: Secondary | ICD-10-CM | POA: Diagnosis not present

## 2014-10-01 ENCOUNTER — Other Ambulatory Visit: Payer: Medicare Other

## 2014-10-01 ENCOUNTER — Telehealth: Payer: Self-pay | Admitting: Internal Medicine

## 2014-10-01 NOTE — Telephone Encounter (Signed)
Patient was seen by ENT regards neck pain, no ENT problems found, pain felt to be cervical spine related. Please call the patient about the results of the ENT evaluation and the letter.  Send a letter to surgery:  Samantha [name] is a patient of mine in need of colon surgery. During the surgical clearance visit, she complained of neck pain, she was refer to ENT, they felt the symptoms were related to the cervical spine. The patient is cleared medically for surgery as long the anesthesia service is aware of patient's  cervical spine problems. Please feel free to contact me with questions.

## 2014-10-01 NOTE — Telephone Encounter (Signed)
Received fax confirmation on 10/01/2014 at 1523.

## 2014-10-01 NOTE — Telephone Encounter (Signed)
Letter printed, along with OV notes from 09/17/2014 and chest x-ray from 09/17/2014 and faxed to [facility] at [phone].

## 2014-10-01 NOTE — Telephone Encounter (Signed)
Pt informed via MyChart that she has been cleared by Dr. Larose Kells for surgery.

## 2014-10-03 LAB — URINE CULTURE: Colony Count: >=100000

## 2014-10-06 MED ORDER — CIPROFLOXACIN HCL 500 MG PO TABS: 500.0000 mg | ORAL_TABLET | Freq: Two times a day (BID) | ORAL | Status: DC

## 2014-10-06 NOTE — Addendum Note (Signed)
Addended by: Wilfrid Lund on: 10/06/2014 01:26 PM   Modules accepted: Orders

## 2014-10-08 ENCOUNTER — Telehealth: Payer: Self-pay | Admitting: Internal Medicine

## 2014-10-08 NOTE — Telephone Encounter (Signed)
Called and spoke with [employer], Pt's dentist office, informed them that Pt is not currently on blood thinning medications. LMOM for Pt informing that I called her dental office and informed of the above. Also, informed her that I faxed her surgical clearance to [facility] on 10/01/2014. Informed her to call if she has any other questions or concerns.

## 2014-10-08 NOTE — Telephone Encounter (Signed)
Caller name:Riera, Keyla Milone Relation to GH:WEXH Call back number: [phone]   Reason for call:  Patient is having a dentist cleaning Thursday 10/14/14 and dentist would like to know if patient is on blood thinning medication. Please call [place], [place],  [phone] and patient would like to speak would like to know the status of surgical clearance. Please follow up with patient directly

## 2014-10-19 ENCOUNTER — Encounter (HOSPITAL_COMMUNITY): Payer: Self-pay | Admitting: Emergency Medicine

## 2014-10-19 ENCOUNTER — Emergency Department (INDEPENDENT_AMBULATORY_CARE_PROVIDER_SITE_OTHER)
Admission: EM | Admit: 2014-10-19 | Discharge: 2014-10-19 | Disposition: A | Discharge status: Nursing Home (Includes ICF) | Payer: Medicare Other | Source: Home / Self Care | Attending: Emergency Medicine | Admitting: Emergency Medicine

## 2014-10-19 ENCOUNTER — Emergency Department (HOSPITAL_COMMUNITY): Payer: Medicare Other

## 2014-10-19 DIAGNOSIS — Z85038 Personal history of other malignant neoplasm of large intestine: Secondary | ICD-10-CM | POA: Diagnosis not present

## 2014-10-19 DIAGNOSIS — J3089 Other allergic rhinitis: Secondary | ICD-10-CM | POA: Diagnosis not present

## 2014-10-19 DIAGNOSIS — I10 Essential (primary) hypertension: Secondary | ICD-10-CM | POA: Diagnosis not present

## 2014-10-19 DIAGNOSIS — Z8679 Personal history of other diseases of the circulatory system: Secondary | ICD-10-CM

## 2014-10-19 DIAGNOSIS — R079 Chest pain, unspecified: Principal | ICD-10-CM | POA: Insufficient documentation

## 2014-10-19 DIAGNOSIS — Z01818 Encounter for other preprocedural examination: Secondary | ICD-10-CM | POA: Insufficient documentation

## 2014-10-19 DIAGNOSIS — E785 Hyperlipidemia, unspecified: Secondary | ICD-10-CM | POA: Insufficient documentation

## 2014-10-19 DIAGNOSIS — Z87891 Personal history of nicotine dependence: Secondary | ICD-10-CM | POA: Insufficient documentation

## 2014-10-19 DIAGNOSIS — K219 Gastro-esophageal reflux disease without esophagitis: Secondary | ICD-10-CM | POA: Insufficient documentation

## 2014-10-19 DIAGNOSIS — E119 Type 2 diabetes mellitus without complications: Secondary | ICD-10-CM

## 2014-10-19 DIAGNOSIS — E1140 Type 2 diabetes mellitus with diabetic neuropathy, unspecified: Secondary | ICD-10-CM | POA: Diagnosis not present

## 2014-10-19 DIAGNOSIS — I509 Heart failure, unspecified: Secondary | ICD-10-CM | POA: Insufficient documentation

## 2014-10-19 DIAGNOSIS — I5032 Chronic diastolic (congestive) heart failure: Secondary | ICD-10-CM | POA: Insufficient documentation

## 2014-10-19 DIAGNOSIS — R0600 Dyspnea, unspecified: Secondary | ICD-10-CM

## 2014-10-19 DIAGNOSIS — Z8673 Personal history of transient ischemic attack (TIA), and cerebral infarction without residual deficits: Secondary | ICD-10-CM | POA: Diagnosis not present

## 2014-10-19 DIAGNOSIS — I2510 Atherosclerotic heart disease of native coronary artery without angina pectoris: Secondary | ICD-10-CM | POA: Diagnosis not present

## 2014-10-19 DIAGNOSIS — G4733 Obstructive sleep apnea (adult) (pediatric): Secondary | ICD-10-CM | POA: Insufficient documentation

## 2014-10-19 LAB — CBC
HCT: 38.5 % (ref 36.0–46.0)
Hemoglobin: 12.6 g/dL (ref 12.0–15.0)
MCH: 28.1 pg (ref 26.0–34.0)
MCHC: 32.7 g/dL (ref 30.0–36.0)
MCV: 85.9 fL (ref 78.0–100.0)
PLATELETS: 161 10*3/uL (ref 150–400)
RBC: 4.48 MIL/uL (ref 3.87–5.11)
RDW: 14.2 % (ref 11.5–15.5)
WBC: 4.1 10*3/uL (ref 4.0–10.5)

## 2014-10-19 LAB — BASIC METABOLIC PANEL
Anion gap: 8 (ref 5–15)
BUN: 15 mg/dL (ref 6–20)
CALCIUM: 9.1 mg/dL (ref 8.9–10.3)
CO2: 28 mmol/L (ref 22–32)
Chloride: 103 mmol/L (ref 101–111)
Creatinine, Ser: 0.87 mg/dL (ref 0.44–1.00)
GFR calc non Af Amer: >60 mL/min (ref >60)
Glucose, Bld: 99 mg/dL (ref 65–99)
Potassium: 3.8 mmol/L (ref 3.5–5.1)
Sodium: 139 mmol/L (ref 135–145)

## 2014-10-19 LAB — I-STAT TROPONIN, ED: TROPONIN I, POC: 0 ng/mL (ref 0.00–0.08)

## 2014-10-19 NOTE — ED Provider Notes (Signed)
CSN: [id number]     Arrival date & time 10/19/14  1652 History   First MD Initiated Contact with Patient 10/19/14 1825     Chief Complaint  Patient presents with  . Cough   (Consider location/radiation/quality/duration/timing/severity/associated sxs/prior Treatment) HPI Comments: Morbidly obese 74 year old female presents with a four-day history of chest pain. She describes it as a dull ache and heavy feeling. It is located across the anterior chest. She states that it is a constant type pain. Nothing makes it worse. Nothing makes it better. He has associated with shortness of breath. Last p.m. she was experiencing nausea but no vomiting and this morning lightheadedness and sweating. She is also had a nonproductive cough and runny nose. She has a history of pneumonia, CHF, hypertension, type 2 diabetes mellitus and TIAs. Patient states that she believes she had a fever with a temperature of 99.   Past Medical History  Diagnosis Date  . OSA (obstructive sleep apnea) 09/2007    dx w/ a sleep study, not on  CPAP  . Hyperlipidemia   . Hypertension   . Chest pain, atypical 11/2006    saw cards: neg/normal Holter, ECHo and myoview, admitted w/ CP 4/09 neg enz.and CT chest had a 9 beat NSVT  . Fibromyalgia   . Abnormal CT of the chest 2008    last CT4-l 2009:  . No f/u suggested   . 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 Shingles 11/2009  . Vaginal dysplasia   . Vaginal cancer 1994  . Type II diabetes mellitus   . Heart murmur   . CHF (congestive heart failure)   . Pneumonia     "double" (06/27/2012)  . Asthma   . H/O hiatal hernia   . GERD (gastroesophageal reflux disease)   . Ocular migraine   . TIA (transient ischemic attack) 06/27/2012    "this is my first" (06/27/2012)  . Rheumatoid factor positive   . Osteoarthritis   . Reactive airway disease 01/29/2002    dx of pseudoasthma / vcd in 2005 and nl sprirometry History of dyspnea, 2011,  improved after several  medications were changed around Question of COPD, disproved July 06, 2009 with nl pft's      . TIA (transient ischemic attack)     x2  . Malignant neoplasm of ascending colon 2016    Minimally invasive right hemicolectomy   Past Surgical History  Procedure Laterality Date  . Thyroidectomy, partial  1960's  . Bunionectomy Left ~ 1977  . Breast biopsy Right 1999  . Anterior cervical decomp/discectomy fusion  2001  . Cataract extraction w/ intraocular lens  implant, bilateral  2012  . Vaginal mass excision  1994    "Laser surgery for vaginal cancer; followed by chemotherapy" (06/27/2012)  . Abdominal hysterectomy  1980    NO oophorectomy per pt    Family History  Problem Relation Age of Onset  . Allergies Sister   . Parkinsonism Sister     possible  . Asthma Sister   . Asthma Paternal Grandmother   . Heart disease Father   . Heart disease Mother   . Lung cancer Mother   . Heart disease      paternal grandparents, maternal grandparents,   . Heart disease Brother   . Emphysema Brother   . Aneurysm Brother     x3  . Kidney failure Brother   . Diabetes Brother   . Breast cancer Neg Hx   .  Colon cancer Neg Hx   . Diabetes Brother   . Heart attack Neg Hx   . Stroke Brother   . Stroke Maternal Grandmother   . Stroke Paternal Grandmother    Social History  Substance Use Topics  . Smoking status: Former Smoker    Types: Cigarettes  . Smokeless tobacco: Never Used     Comment: Quit in 2001  . Alcohol Use: No   OB History    Gravida Para Term Preterm AB TAB SAB Ectopic Multiple Living   2              Review of Systems  Constitutional: Positive for diaphoresis and activity change.  HENT: Positive for rhinorrhea. Negative for ear pain and sore throat.   Respiratory: Positive for cough and shortness of breath.   Cardiovascular: Positive for chest pain. Negative for palpitations and leg swelling.  Gastrointestinal: Positive for nausea. Negative for vomiting and abdominal  pain.  Genitourinary: Negative.   Musculoskeletal: Negative for back pain.  Skin: Negative for rash and wound.  Neurological: Positive for light-headedness. Negative for syncope.    Allergies  Cefuroxime axetil; Oxycodone; Seldane; Zocor; Tramadol; Bactrim; and Pravastatin  Home Medications   Prior to Admission medications   Medication Sig Start Date End Date Taking? Authorizing Provider  albuterol (PROVENTIL HFA;VENTOLIN HFA) 108 (90 BASE) MCG/ACT inhaler Inhale 2 puffs into the lungs every 6 (six) hours as needed for wheezing or shortness of breath.    Historical Provider, MD  atorvastatin (LIPITOR) 10 MG tablet Take 1 tablet (10 mg total) by mouth daily. 05/19/14   Colon Branch, MD  butalbital-acetaminophen-caffeine (FIORICET, [place]) [phone] MG per tablet As needed for migraine 01/05/14   Marcial Pacas, MD  BYSTOLIC 10 MG tablet TAKE 2 TABLETS BY MOUTH IN THE MORNING AND 1 TABLET IN THE EVENING SPACE 12 HOURS APART 03/01/14   [name], MD  CALCIUM PO Take 1 tablet by mouth daily.      Historical Provider, MD  cetirizine (ZYRTEC) 10 MG tablet Take 1 tablet (10 mg total) by mouth daily. 05/12/14   Colon Branch, MD  cholecalciferol (VITAMIN D) 1000 UNITS tablet Take 1,000 Units by mouth daily.    Historical Provider, MD  ciprofloxacin (CIPRO) 500 MG tablet Take 1 tablet (500 mg total) by mouth 2 (two) times daily. 10/06/14   Colon Branch, MD  clopidogrel (PLAVIX) 75 MG tablet Take 1 tablet (75 mg total) by mouth daily with breakfast. 11/12/13   Philmore Pali, NP  DULoxetine (CYMBALTA) 60 MG capsule TAKE 1 CAPSULE (60 MG TOTAL) BY MOUTH DAILY. 12/16/13   Marcial Pacas, MD  fluticasone (FLONASE) 50 MCG/ACT nasal spray Place 2 sprays into the nose as needed for rhinitis.    Historical Provider, MD  furosemide (LASIX) 20 MG tablet Take 1 tablet (20 mg total) by mouth 2 (two) times daily. 05/19/14   Colon Branch, MD  hydrALAZINE (APRESOLINE) 25 MG tablet Take 1 tablet (25 mg total) by mouth 3 (three) times  daily. 04/21/14   [name], MD  lisinopril (PRINIVIL,ZESTRIL) 40 MG tablet Take 1 tablet (40 mg total) by mouth daily. 05/19/14   Colon Branch, MD  mesalamine (CANASA) 1000 MG suppository Place 1 suppository (1,000 mg total) rectally at bedtime. Patient not taking: Reported on 09/17/2014 08/30/14   Milus Banister, MD  metFORMIN (GLUCOPHAGE-XR) 500 MG 24 hr tablet Take 1 tablet (500 mg total) by mouth daily with breakfast. 05/19/14  Colon Branch, MD  Misc Natural Products (OSTEO BI-FLEX JOINT SHIELD) TABS Take 1 tablet by mouth daily.    Historical Provider, MD  Multiple Vitamin (MULTIVITAMIN) capsule Take 1 capsule by mouth daily.      Historical Provider, MD  ondansetron (ZOFRAN ODT) 4 MG disintegrating tablet Take 1 tablet (4 mg total) by mouth every 8 (eight) hours as needed for nausea or vomiting. Patient not taking: Reported on 09/17/2014 12/03/13   Fredia Sorrow, MD  Polyethyl Glycol-Propyl Glycol (SYSTANE) 0.4-0.3 % GEL Apply 1 drop to eye daily as needed (dry eyes).    Historical Provider, MD  Potassium 99 MG TABS Take 99 mg by mouth daily.     Historical Provider, MD  pregabalin (LYRICA) 100 MG capsule Take 1 capsule (100 mg total) by mouth at bedtime as needed. 06/22/14   Colon Branch, MD  ranitidine (ZANTAC) 300 MG tablet Take 1 tablet (300 mg total) by mouth at bedtime. 05/19/14   Colon Branch, MD   Meds Ordered and Administered this Visit  Medications - No data to display  BP 172/83 mmHg  Pulse 60  Temp(Src) 97 F (36.1 C) (Oral)  Resp 18  SpO2 97% No data found.   Physical Exam  Constitutional: She is oriented to person, place, and time. She appears well-developed and well-nourished. No distress.  HENT:  Minor irritative erythema of the oropharynx. No exudates.  Eyes: Conjunctivae and EOM are normal.  Neck: Normal range of motion. Neck supple.  Cardiovascular: Normal rate, regular rhythm and normal heart sounds.   No murmur heard. Pulmonary/Chest: Effort normal.  Breath  sounds mildly diminished bilaterally. No wheezes or crackles. Reproducible anterior chest wall tenderness.   Musculoskeletal: She exhibits no edema.  Neurological: She is alert and oriented to person, place, and time. She exhibits normal muscle tone.  Skin: Skin is warm and dry. No erythema.  Psychiatric: She has a normal mood and affect.  Nursing note and vitals reviewed.   ED Course  Procedures (including critical care time)  Labs Review Labs Reviewed - No data to display  Imaging Review Dg Chest 2 View  10/19/2014   CLINICAL DATA:  Chest pain  EXAM: CHEST  2 VIEW  COMPARISON:  09/17/2014  FINDINGS: Cardiomediastinal silhouette is stable. No acute infiltrate or pleural effusion. No pulmonary edema. Degenerative changes thoracic spine again noted. Metallic fixation material cervical spine.  IMPRESSION: No active cardiopulmonary disease.   Electronically Signed   By: Lahoma Crocker M.D.   On: 10/19/2014 20:15   ED ECG REPORT   Date: 10/19/2014  Rate: 55  Rhythm: normal sinus rhythm  QRS Axis: normal  Intervals: normal  ST/T Wave abnormalities: normal  Conduction Disutrbances:none  Narrative Interpretation:   Old EKG Reviewed: unchanged  I have personally reviewed the EKG tracing and agree with the computerized printout as noted.   Visual Acuity Review  Right Eye Distance:   Left Eye Distance:   Bilateral Distance:    Right Eye Near:   Left Eye Near:    Bilateral Near:         MDM   1. Chest pain, unspecified chest pain type   2. Dyspnea   3. Other allergic rhinitis   4. History of CHF (congestive heart failure)   5. Essential hypertension   6. Type 2 diabetes mellitus without complication    Transfer to [place] for evaluation of chest pain associated with shortness of breath, nausea, lightheadedness and sweating. The patient is stable  in the urgent care with a normal EKG and normal chest x-ray. She is in no distress or discomfort. Due to her past medical cardiac  history and other risk factors and present history of symptoms recommend additional evaluation.   Janne Napoleon, NP 10/19/14 [phone]

## 2014-10-19 NOTE — ED Notes (Signed)
Patient here from Urgent Care with c/o chest pressure and URI symptoms.

## 2014-10-19 NOTE — ED Notes (Signed)
X-ray tech called to advise that when pt was at u/c today she was brought here for the x-ray because u/c x-ray was down.  Advised to cancel x-ray.

## 2014-10-19 NOTE — ED Notes (Addendum)
Patient reports that she has felt chest pain since Friday, achiness, cough, slight fever, runny nose ever since then.  Last night noticed nausea and "feeling bad" and noticed lung sounds with breathing.  Patient concerned for pneumonia

## 2014-10-19 NOTE — ED Notes (Signed)
Pt. reports central chest pain with SOB nausea and diaphoresis onset Friday night , denies cough or congestion , no fever or chills.

## 2014-10-20 ENCOUNTER — Observation Stay (HOSPITAL_COMMUNITY): Payer: Medicare Other

## 2014-10-20 ENCOUNTER — Other Ambulatory Visit: Payer: Self-pay | Admitting: Student

## 2014-10-20 ENCOUNTER — Emergency Department (HOSPITAL_COMMUNITY): Payer: Medicare Other

## 2014-10-20 ENCOUNTER — Observation Stay (HOSPITAL_COMMUNITY)
Admission: EM | Admit: 2014-10-20 | Discharge: 2014-10-20 | Disposition: A | Discharge status: Home / Self Care | Payer: Medicare Other | Attending: Internal Medicine | Admitting: Internal Medicine

## 2014-10-20 DIAGNOSIS — I10 Essential (primary) hypertension: Secondary | ICD-10-CM | POA: Diagnosis not present

## 2014-10-20 DIAGNOSIS — R079 Chest pain, unspecified: Secondary | ICD-10-CM

## 2014-10-20 DIAGNOSIS — I5032 Chronic diastolic (congestive) heart failure: Secondary | ICD-10-CM | POA: Diagnosis present

## 2014-10-20 DIAGNOSIS — R0789 Other chest pain: Secondary | ICD-10-CM | POA: Diagnosis not present

## 2014-10-20 DIAGNOSIS — E119 Type 2 diabetes mellitus without complications: Secondary | ICD-10-CM

## 2014-10-20 LAB — CBC WITH DIFFERENTIAL/PLATELET
BASOS ABS: 0 10*3/uL (ref 0.0–0.1)
BASOS PCT: 1 %
Eosinophils Absolute: 0.1 10*3/uL (ref 0.0–0.7)
Eosinophils Relative: 2 %
HCT: 36 % (ref 36.0–46.0)
Hemoglobin: 11.9 g/dL — ABNORMAL LOW (ref 12.0–15.0)
LYMPHS PCT: 51 %
Lymphs Abs: 1.8 10*3/uL (ref 0.7–4.0)
MCH: 28.1 pg (ref 26.0–34.0)
MCHC: 33.1 g/dL (ref 30.0–36.0)
MCV: 84.9 fL (ref 78.0–100.0)
MONOS PCT: 7 %
Monocytes Absolute: 0.3 10*3/uL (ref 0.1–1.0)
NEUTROS ABS: 1.4 10*3/uL — AB (ref 1.7–7.7)
NEUTROS PCT: 39 %
PLATELETS: 131 10*3/uL — AB (ref 150–400)
RBC: 4.24 MIL/uL (ref 3.87–5.11)
RDW: 14.2 % (ref 11.5–15.5)
WBC: 3.5 10*3/uL — ABNORMAL LOW (ref 4.0–10.5)

## 2014-10-20 LAB — COMPREHENSIVE METABOLIC PANEL
ALT: 17 U/L (ref 14–54)
AST: 19 U/L (ref 15–41)
Albumin: 3.5 g/dL (ref 3.5–5.0)
Alkaline Phosphatase: 47 U/L (ref 38–126)
Anion gap: 6 (ref 5–15)
BUN: 14 mg/dL (ref 6–20)
CHLORIDE: 109 mmol/L (ref 101–111)
CO2: 26 mmol/L (ref 22–32)
CREATININE: 0.73 mg/dL (ref 0.44–1.00)
Calcium: 8.9 mg/dL (ref 8.9–10.3)
Glucose, Bld: 105 mg/dL — ABNORMAL HIGH (ref 65–99)
POTASSIUM: 3.6 mmol/L (ref 3.5–5.1)
SODIUM: 141 mmol/L (ref 135–145)
Total Bilirubin: 0.5 mg/dL (ref 0.3–1.2)
Total Protein: 6.5 g/dL (ref 6.5–8.1)

## 2014-10-20 LAB — BRAIN NATRIURETIC PEPTIDE: B NATRIURETIC PEPTIDE 5: 54.1 pg/mL (ref 0.0–100.0)

## 2014-10-20 LAB — GLUCOSE, CAPILLARY
GLUCOSE-CAPILLARY: 92 mg/dL (ref 65–99)
GLUCOSE-CAPILLARY: 104 mg/dL — AB (ref 65–99)
GLUCOSE-CAPILLARY: 100 mg/dL — AB (ref 65–99)
Glucose-Capillary: 108 mg/dL — ABNORMAL HIGH (ref 65–99)

## 2014-10-20 LAB — TROPONIN I

## 2014-10-20 MED ORDER — ATORVASTATIN CALCIUM 10 MG PO TABS
10.0000 mg | ORAL_TABLET | Freq: Every day | ORAL | Status: DC
  Administered 2014-10-20: 10 mg via ORAL
  Filled 2014-10-20: qty 1

## 2014-10-20 MED ORDER — LISINOPRIL 40 MG PO TABS
40.0000 mg | ORAL_TABLET | Freq: Every day | ORAL | Status: DC
  Administered 2014-10-20: 40 mg via ORAL
  Filled 2014-10-20: qty 1

## 2014-10-20 MED ORDER — ENOXAPARIN SODIUM 40 MG/0.4ML ~~LOC~~ SOLN
40.0000 mg | SUBCUTANEOUS | Status: DC
  Administered 2014-10-20: 40 mg via SUBCUTANEOUS
  Filled 2014-10-20: qty 0.4

## 2014-10-20 MED ORDER — INSULIN ASPART 100 UNIT/ML ~~LOC~~ SOLN
0.0000 [IU] | Freq: Four times a day (QID) | SUBCUTANEOUS | Status: DC
Start: 2014-10-20 — End: 2014-10-20

## 2014-10-20 MED ORDER — HYDRALAZINE HCL 25 MG PO TABS
25.0000 mg | ORAL_TABLET | Freq: Three times a day (TID) | ORAL | Status: DC
  Administered 2014-10-20 (×2): 25 mg via ORAL
  Filled 2014-10-20 (×2): qty 1

## 2014-10-20 MED ORDER — FAMOTIDINE 20 MG PO TABS: 20.0000 mg | ORAL_TABLET | Freq: Every day | ORAL | Status: DC

## 2014-10-20 MED ORDER — ONDANSETRON HCL 4 MG/2ML IJ SOLN: 4.0000 mg | Freq: Four times a day (QID) | INTRAMUSCULAR | Status: DC | PRN

## 2014-10-20 MED ORDER — NITROGLYCERIN 0.4 MG SL SUBL
0.4000 mg | SUBLINGUAL_TABLET | SUBLINGUAL | Status: DC | PRN
  Administered 2014-10-20 (×2): 0.4 mg via SUBLINGUAL
  Filled 2014-10-20: qty 1

## 2014-10-20 MED ORDER — FLUTICASONE PROPIONATE 50 MCG/ACT NA SUSP: 2.0000 | Freq: Every day | NASAL | Status: DC | PRN

## 2014-10-20 MED ORDER — ALBUTEROL SULFATE (2.5 MG/3ML) 0.083% IN NEBU: 2.5000 mg | INHALATION_SOLUTION | Freq: Four times a day (QID) | RESPIRATORY_TRACT | Status: DC | PRN

## 2014-10-20 MED ORDER — ACETAMINOPHEN 325 MG PO TABS: 650.0000 mg | ORAL_TABLET | ORAL | Status: DC | PRN

## 2014-10-20 MED ORDER — FUROSEMIDE 20 MG PO TABS
20.0000 mg | ORAL_TABLET | Freq: Two times a day (BID) | ORAL | Status: DC
  Administered 2014-10-20 (×2): 20 mg via ORAL
  Filled 2014-10-20 (×2): qty 1

## 2014-10-20 MED ORDER — ASPIRIN 81 MG PO CHEW
324.0000 mg | CHEWABLE_TABLET | Freq: Once | ORAL | Status: AC
  Administered 2014-10-20: 324 mg via ORAL
  Filled 2014-10-20: qty 4

## 2014-10-20 MED ORDER — ASPIRIN EC 325 MG PO TBEC
325.0000 mg | DELAYED_RELEASE_TABLET | Freq: Every day | ORAL | Status: DC
  Administered 2014-10-20: 325 mg via ORAL
  Filled 2014-10-20: qty 1

## 2014-10-20 MED ORDER — NEBIVOLOL HCL 10 MG PO TABS
10.0000 mg | ORAL_TABLET | Freq: Two times a day (BID) | ORAL | Status: DC
  Administered 2014-10-20: 10 mg via ORAL
  Filled 2014-10-20 (×2): qty 1

## 2014-10-20 MED ORDER — DULOXETINE HCL 60 MG PO CPEP
60.0000 mg | ORAL_CAPSULE | Freq: Every day | ORAL | Status: DC
  Administered 2014-10-20: 60 mg via ORAL
  Filled 2014-10-20: qty 1

## 2014-10-20 MED ORDER — CLOPIDOGREL BISULFATE 75 MG PO TABS
75.0000 mg | ORAL_TABLET | Freq: Every day | ORAL | Status: DC
  Administered 2014-10-20: 75 mg via ORAL
  Filled 2014-10-20: qty 1

## 2014-10-20 NOTE — H&P (Signed)
[facility] History and Physical  Patient: Samantha [name]  MRN: [id number]  DOB: Dec 30, 1940  DOS: the patient was seen and examined on 10/20/2014 PCP: Kathlene November, MD  Referring physician: Dr. Leonides Schanz Chief Complaint: Chest pain  HPI: Samantha [name] is a 74 y.o. female with Past medical history of ulcerative sleep apnea, dyslipidemia, hypertension, coronary artery disease, neuropathy, GERD. The patient presented with numbness of chest tightness associated with shortness of breath and sweating. His ongoing since last one day. The tightness is currently resolved. The patient mentions of the tightness initially when started was continuous and felt like squeezing pain. Patient denies any fever or chills complex of some cough with expectoration. Denies having any leg swelling no recent travel or surgery or hospitalization. Next and no recent change in her medication.  The patient is coming from home.  At her baseline ambulates without any support And is independent for most of her ADL; manages her medication on her own.  Review of Systems: as mentioned in the history of present illness.  A comprehensive review of the other systems is negative.  Past Medical History  Diagnosis Date  . OSA (obstructive sleep apnea) 09/2007    dx w/ a sleep study, not on  CPAP  . Hyperlipidemia   . Hypertension   . Chest pain, atypical 11/2006    saw cards: neg/normal Holter, ECHo and myoview, admitted w/ CP 4/09 neg enz.and CT chest had a 9 beat NSVT  . Fibromyalgia   . Abnormal CT of the chest 2008    last CT4-l 2009:  . No f/u suggested   . 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 Shingles 11/2009  . Vaginal dysplasia   . Vaginal cancer 1994  . Type II diabetes mellitus   . Heart murmur   . CHF (congestive heart failure)   . Pneumonia     "double" (06/27/2012)  . Asthma   . H/O hiatal hernia   . GERD (gastroesophageal reflux disease)   . Ocular migraine   . TIA  (transient ischemic attack) 06/27/2012    "this is my first" (06/27/2012)  . Rheumatoid factor positive   . Osteoarthritis   . Reactive airway disease 01/29/2002    dx of pseudoasthma / vcd in 2005 and nl sprirometry History of dyspnea, 2011,  improved after several medications were changed around Question of COPD, disproved July 06, 2009 with nl pft's      . TIA (transient ischemic attack)     x2  . Malignant neoplasm of ascending colon 2016    Minimally invasive right hemicolectomy   Past Surgical History  Procedure Laterality Date  . Thyroidectomy, partial  1960's  . Bunionectomy Left ~ 1977  . Breast biopsy Right 1999  . Anterior cervical decomp/discectomy fusion  2001  . Cataract extraction w/ intraocular lens  implant, bilateral  2012  . Vaginal mass excision  1994    "Laser surgery for vaginal cancer; followed by chemotherapy" (06/27/2012)  . Abdominal hysterectomy  1980    NO oophorectomy per pt    Social History:  reports that she has quit smoking. Her smoking use included Cigarettes. She has never used smokeless tobacco. She reports that she does not drink alcohol or use illicit drugs.  Allergies  Allergen Reactions  . Cefuroxime Axetil     REACTION: urticaria (hives)  . Oxycodone Nausea And Vomiting  . Seldane [Terfenadine]     REACTION: urticaria (hives)  .  Zocor [Simvastatin]     2012 "Muscle breakdown " with profuse sweating  . Tramadol     REACTION: vomitting  . Bactrim [Sulfamethoxazole-Trimethoprim]     See OV 09-15-13, rash-tongue swelling due to bactrim ?  . Pravastatin     "muscle breakdown" with profuse sweating    Family History  Problem Relation Age of Onset  . Allergies Sister   . Parkinsonism Sister     possible  . Asthma Sister   . Asthma Paternal Grandmother   . Heart disease Father   . Heart disease Mother   . Lung cancer Mother   . Heart disease      paternal grandparents, maternal grandparents,   . Heart disease Brother   . Emphysema  Brother   . Aneurysm Brother     x3  . Kidney failure Brother   . Diabetes Brother   . Breast cancer Neg Hx   . Colon cancer Neg Hx   . Diabetes Brother   . Heart attack Neg Hx   . Stroke Brother   . Stroke Maternal Grandmother   . Stroke Paternal Grandmother     Prior to Admission medications   Medication Sig Start Date End Date Taking? Authorizing Provider  albuterol (PROVENTIL HFA;VENTOLIN HFA) 108 (90 BASE) MCG/ACT inhaler Inhale 2 puffs into the lungs every 6 (six) hours as needed for wheezing or shortness of breath.    Historical Provider, MD  atorvastatin (LIPITOR) 10 MG tablet Take 1 tablet (10 mg total) by mouth daily. 05/19/14   Colon Branch, MD  butalbital-acetaminophen-caffeine (FIORICET, [place]) [phone] MG per tablet As needed for migraine 01/05/14   Marcial Pacas, MD  BYSTOLIC 10 MG tablet TAKE 2 TABLETS BY MOUTH IN THE MORNING AND 1 TABLET IN THE EVENING SPACE 12 HOURS APART 03/01/14   [name], MD  CALCIUM PO Take 1 tablet by mouth daily.      Historical Provider, MD  cetirizine (ZYRTEC) 10 MG tablet Take 1 tablet (10 mg total) by mouth daily. 05/12/14   Colon Branch, MD  cholecalciferol (VITAMIN D) 1000 UNITS tablet Take 1,000 Units by mouth daily.    Historical Provider, MD  ciprofloxacin (CIPRO) 500 MG tablet Take 1 tablet (500 mg total) by mouth 2 (two) times daily. 10/06/14   Colon Branch, MD  clopidogrel (PLAVIX) 75 MG tablet Take 1 tablet (75 mg total) by mouth daily with breakfast. 11/12/13   Philmore Pali, NP  DULoxetine (CYMBALTA) 60 MG capsule TAKE 1 CAPSULE (60 MG TOTAL) BY MOUTH DAILY. 12/16/13   Marcial Pacas, MD  fluticasone (FLONASE) 50 MCG/ACT nasal spray Place 2 sprays into the nose as needed for rhinitis.    Historical Provider, MD  furosemide (LASIX) 20 MG tablet Take 1 tablet (20 mg total) by mouth 2 (two) times daily. 05/19/14   Colon Branch, MD  hydrALAZINE (APRESOLINE) 25 MG tablet Take 1 tablet (25 mg total) by mouth 3 (three) times daily. 04/21/14   [name], MD  lisinopril (PRINIVIL,ZESTRIL) 40 MG tablet Take 1 tablet (40 mg total) by mouth daily. 05/19/14   Colon Branch, MD  mesalamine (CANASA) 1000 MG suppository Place 1 suppository (1,000 mg total) rectally at bedtime. Patient not taking: Reported on 09/17/2014 08/30/14   Milus Banister, MD  metFORMIN (GLUCOPHAGE-XR) 500 MG 24 hr tablet Take 1 tablet (500 mg total) by mouth daily with breakfast. 05/19/14   Colon Branch, MD  Misc Natural Products (OSTEO BI-FLEX  JOINT SHIELD) TABS Take 1 tablet by mouth daily.    Historical Provider, MD  Multiple Vitamin (MULTIVITAMIN) capsule Take 1 capsule by mouth daily.      Historical Provider, MD  ondansetron (ZOFRAN ODT) 4 MG disintegrating tablet Take 1 tablet (4 mg total) by mouth every 8 (eight) hours as needed for nausea or vomiting. Patient not taking: Reported on 09/17/2014 12/03/13   Fredia Sorrow, MD  Polyethyl Glycol-Propyl Glycol (SYSTANE) 0.4-0.3 % GEL Apply 1 drop to eye daily as needed (dry eyes).    Historical Provider, MD  Potassium 99 MG TABS Take 99 mg by mouth daily.     Historical Provider, MD  pregabalin (LYRICA) 100 MG capsule Take 1 capsule (100 mg total) by mouth at bedtime as needed. 06/22/14   Colon Branch, MD  ranitidine (ZANTAC) 300 MG tablet Take 1 tablet (300 mg total) by mouth at bedtime. 05/19/14   Colon Branch, MD    Physical Exam: Filed Vitals:   10/20/14 0215 10/20/14 0245 10/20/14 0300 10/20/14 0342  BP: 138/62 159/75 147/74 162/80  Pulse: 52 51 53 51  Temp:    97.5 F (36.4 C)  TempSrc:    Oral  Resp: 19 13 13 16   Height:    5\' 2"  (1.575 m)  Weight:    87.4 kg (192 lb 10.9 oz)  SpO2: 100% 100% 100% 100%    General: Alert, Awake and Oriented to Time, Place and Person. Appear in mild distress Eyes: PERRL ENT: Oral Mucosa clear moist. Neck: no JVD Cardiovascular: S1 and S2 Present, no Murmur, Peripheral Pulses Present Respiratory: Bilateral Air entry equal and Decreased,  Clear to Auscultation, no Crackles, no  wheezes Abdomen: Bowel Sound prsent, Soft and no tenderness Skin: no Rash Extremities: Trace Pedal edema, no calf tenderness Neurologic: Grossly no focal neuro deficit.  Labs on Admission:  CBC:  Recent Labs Lab 10/19/14 2129 10/20/14 0456  WBC 4.1 3.5*  NEUTROABS  --  1.4*  HGB 12.6 11.9*  HCT 38.5 36.0  MCV 85.9 84.9  PLT 161 131*    CMP     Component Value Date/Time   NA 139 10/19/2014 2129   K 3.8 10/19/2014 2129   CL 103 10/19/2014 2129   CO2 28 10/19/2014 2129   GLUCOSE 99 10/19/2014 2129   BUN 15 10/19/2014 2129   CREATININE 0.87 10/19/2014 2129   CREATININE 1.11* 08/29/2011 1640   CALCIUM 9.1 10/19/2014 2129   PROT 7.2 12/03/2013 2235   ALBUMIN 3.8 12/03/2013 2235   AST 22 12/03/2013 2235   ALT 20 12/03/2013 2235   ALKPHOS 63 12/03/2013 2235   BILITOT 0.3 12/03/2013 2235   GFRNONAA >60 10/19/2014 2129   GFRNONAA 73 08/14/2011 1102   GFRAA >60 10/19/2014 2129   GFRAA 84 08/14/2011 1102    No results for input(s): CKTOTAL, CKMB, CKMBINDEX, TROPONINI in the last 168 hours. BNP (last 3 results)  Recent Labs  10/19/14 2129  BNP 54.1    ProBNP (last 3 results) No results for input(s): PROBNP in the last 8760 hours.   Radiological Exams on Admission: Dg Chest 2 View  10/20/2014   CLINICAL DATA:  74 year old female with chest pain  EXAM: CHEST  2 VIEW  COMPARISON:  None.  FINDINGS: The heart size and mediastinal contours are within normal limits. Both lungs are clear. The visualized skeletal structures are unremarkable. Partially visualized cervical spine anterior fixation plate and screws.  IMPRESSION: No active cardiopulmonary disease.   Electronically Signed  By: Anner Crete M.D.   On: 10/20/2014 02:45   Dg Chest 2 View  10/19/2014   CLINICAL DATA:  Chest pain  EXAM: CHEST  2 VIEW  COMPARISON:  09/17/2014  FINDINGS: Cardiomediastinal silhouette is stable. No acute infiltrate or pleural effusion. No pulmonary edema. Degenerative changes thoracic  spine again noted. Metallic fixation material cervical spine.  IMPRESSION: No active cardiopulmonary disease.   Electronically Signed   By: Lahoma Crocker M.D.   On: 10/19/2014 20:15   EKG: Independently reviewed. normal sinus rhythm, nonspecific ST and T waves changes.  Assessment/Plan 1. Chest pain Patient presented, as her chest pain. Currently chest pain-free. With this the patient will be admitted to telemetry unit. We'll rule out ACS. Follow serial troponin. Also get an echocardiogram. Add aspirin continue Plavix.  2.DM II (diabetes mellitus, type II), w/ neuropathy Placing her on insulin sliding scale. Holding oral hypoglycemic and agents.  3HTN (hypertension) Continuing home medications. Blood pressure currently stable.  4Chronic diastolic CHF (congestive heart failure), NYHA class 2 Does not appear volume overloaded. We will continue with the Lasix.  Nutrition: Nothing by mouth except medications DVT Prophylaxis: subcutaneous Heparin  Advance goals of care discussion: Full code  Family Communication: family was present at bedside, opportunity was given to ask question and all questions were answered satisfactorily at the time of interview. Disposition: Admitted as observation, Telemetry unit. Estimated length of stay: One day Author: Berle Mull, MD [facility] Pager: [phone] 10/20/2014  If 7PM-7AM, please contact night-coverage [email or web address] Password [email or web address]

## 2014-10-20 NOTE — Progress Notes (Signed)
Pt arrived to floor with NT and sister. She is alert, oriented and pain free. She ambulated from the stretcher to bathroom then bed. Pt's gait is steady, VS within normal limits.

## 2014-10-20 NOTE — Discharge Instructions (Signed)
You have a Stress Test scheduled at [facility]. Your doctor has ordered this test to get a better idea of how your heart works. ° °Please arrive 15 minutes early for paperwork.  ° °Location: °[address], [address] °[place], [place] [address] °[phone] ° °Instructions: °· No food/drink after midnight the night before.  °· No caffeine/decaf products 24 hours before, including medicines such as Excedrin or Goody Powders. Call if there are any questions.  °· Wear comfortable clothes and shoes.  °· It is OK to take your morning meds with a sip of water EXCEPT for those types of medicines listed below or otherwise instructed. ° °Special Medication Instructions: °· Beta blockers such as metoprolol (Lopressor/Toprol XL), atenolol (Tenormin), carvedilol (Coreg), nebivolol (Bystolic), propranolol (Inderal) should not be taken for 24 hours before the test. °· Calcium channel blockers such as diltiazem (Cardizem) or verapmil (Calan) should not be taken for 24 hours before the test. °· Remove nitroglycerin patches and do not take nitrate preparations such as Imdur/isosorbide the day of your test. °· No Persantine/Theophylline or Aggrenox medicines should be used within 24 hours of the test.  ° °What To Expect: °The whole test will take several hours. When you arrive in the lab, the technician will inject a small amount of radioactive tracer into your arm through an IV while you are resting quietly. This helps us to form pictures of your heart. You will likely only feel a sting from the IV. After a waiting period, resting pictures will be obtained under a big camera. These are the "before" pictures. ° °Next, you will be prepped for the stress portion of the test. This may include either walking on a treadmill or receiving a medicine that helps to dilate blood vessels in your heart to simulate the effect of exercise on your heart. If you are walking on a treadmill, you will walk at different paces to  try to get your heart rate to a goal number that is based on your age. If your doctor has chosen the pharmacologic test, then you will receive a medicine through your IV that may cause temporary nausea, flushing, shortness of breath and sometimes chest discomfort or vomiting. This is typically short-lived and usually resolves quickly. Your blood pressure and heart rate will be monitored, and we will be watching your EKG on a computer screen for any changes. During this portion of the test, the radiologist will inject another small amount of radioactive tracer into your IV. After a waiting period, you will undergo a second set of pictures. These are the "after" pictures. ° °The doctor reading the test will compare the before-and-after images to look for evidence of heart blockages or heart weakness. In certain instances, this test is done over 2 days but usually only takes 1 day to complete. ° °

## 2014-10-20 NOTE — ED Provider Notes (Signed)
This chart was scribed for [place], DO by Forrestine Him, ED Scribe. This patient was seen in room A05C/A05C and the patient's care was started 1:57 AM.   . TIME SEEN: 1:57 AM   CHIEF COMPLAINT:  Chief Complaint  Patient presents with  . Chest Pain     HPI:  HPI Comments: Samantha [name] is a 74 y.o. female with a PMHx HTN, hyperlipidemia, cancer, DM, CHF, TIA who presents to the Emergency Department complaining of intermittent, ongoing central chest pain with associated shortness of breath and diaphoresis x 4 days. Pain is described as achy/dull, tight. Discomfort is made worse with exertion and mildly alleviated with rest. No OTC medications or home remedies attempted prior to arrival. No recent fever, chills, nausea, vomiting, or abdominal pain. No previous history of cardiac catheterizations. However, last cardiac stress test 2 years ago. Samantha [name] plans to undergo surgery in a few months for history of colorectal cancer. No current chemo or radiation treatment.  Cardiologist: Ena Dawley MD PCP: Kathlene November, MD   ROS: See HPI Constitutional: no fever  Eyes: no drainage  ENT: no runny nose   Cardiovascular:   chest pain  Resp:  SOB  GI: no vomiting GU: no dysuria Integumentary: no rash  Allergy: no hives  Musculoskeletal: no leg swelling  Neurological: no slurred speech ROS otherwise negative  PAST MEDICAL HISTORY/PAST SURGICAL HISTORY:  Past Medical History  Diagnosis Date  . OSA (obstructive sleep apnea) 09/2007    dx w/ a sleep study, not on  CPAP  . Hyperlipidemia   . Hypertension   . Chest pain, atypical 11/2006    saw cards: neg/normal Holter, ECHo and myoview, admitted w/ CP 4/09 neg enz.and CT chest had a 9 beat NSVT  . Fibromyalgia   . Abnormal CT of the chest 2008    last CT4-l 2009:  . No f/u suggested   . 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 Shingles 11/2009  . Vaginal dysplasia   . Vaginal cancer 1994  . Type II diabetes  mellitus   . Heart murmur   . CHF (congestive heart failure)   . Pneumonia     "double" (06/27/2012)  . Asthma   . H/O hiatal hernia   . GERD (gastroesophageal reflux disease)   . Ocular migraine   . TIA (transient ischemic attack) 06/27/2012    "this is my first" (06/27/2012)  . Rheumatoid factor positive   . Osteoarthritis   . Reactive airway disease 01/29/2002    dx of pseudoasthma / vcd in 2005 and nl sprirometry History of dyspnea, 2011,  improved after several medications were changed around Question of COPD, disproved July 06, 2009 with nl pft's      . TIA (transient ischemic attack)     x2  . Malignant neoplasm of ascending colon 2016    Minimally invasive right hemicolectomy    MEDICATIONS:  Prior to Admission medications   Medication Sig Start Date End Date Taking? Authorizing Provider  albuterol (PROVENTIL HFA;VENTOLIN HFA) 108 (90 BASE) MCG/ACT inhaler Inhale 2 puffs into the lungs every 6 (six) hours as needed for wheezing or shortness of breath.    Historical Provider, MD  atorvastatin (LIPITOR) 10 MG tablet Take 1 tablet (10 mg total) by mouth daily. 05/19/14   Colon Branch, MD  butalbital-acetaminophen-caffeine (FIORICET, [place]) [phone] MG per tablet As needed for migraine 01/05/14   Marcial Pacas, MD  BYSTOLIC 10 MG  tablet TAKE 2 TABLETS BY MOUTH IN THE MORNING AND 1 TABLET IN THE EVENING SPACE 12 HOURS APART 03/01/14   [name], MD  CALCIUM PO Take 1 tablet by mouth daily.      Historical Provider, MD  cetirizine (ZYRTEC) 10 MG tablet Take 1 tablet (10 mg total) by mouth daily. 05/12/14   Colon Branch, MD  cholecalciferol (VITAMIN D) 1000 UNITS tablet Take 1,000 Units by mouth daily.    Historical Provider, MD  ciprofloxacin (CIPRO) 500 MG tablet Take 1 tablet (500 mg total) by mouth 2 (two) times daily. 10/06/14   Colon Branch, MD  clopidogrel (PLAVIX) 75 MG tablet Take 1 tablet (75 mg total) by mouth daily with breakfast. 11/12/13   Philmore Pali, NP  DULoxetine (CYMBALTA) 60  MG capsule TAKE 1 CAPSULE (60 MG TOTAL) BY MOUTH DAILY. 12/16/13   Marcial Pacas, MD  fluticasone (FLONASE) 50 MCG/ACT nasal spray Place 2 sprays into the nose as needed for rhinitis.    Historical Provider, MD  furosemide (LASIX) 20 MG tablet Take 1 tablet (20 mg total) by mouth 2 (two) times daily. 05/19/14   Colon Branch, MD  hydrALAZINE (APRESOLINE) 25 MG tablet Take 1 tablet (25 mg total) by mouth 3 (three) times daily. 04/21/14   [name], MD  lisinopril (PRINIVIL,ZESTRIL) 40 MG tablet Take 1 tablet (40 mg total) by mouth daily. 05/19/14   Colon Branch, MD  mesalamine (CANASA) 1000 MG suppository Place 1 suppository (1,000 mg total) rectally at bedtime. Patient not taking: Reported on 09/17/2014 08/30/14   Milus Banister, MD  metFORMIN (GLUCOPHAGE-XR) 500 MG 24 hr tablet Take 1 tablet (500 mg total) by mouth daily with breakfast. 05/19/14   Colon Branch, MD  Misc Natural Products (OSTEO BI-FLEX JOINT SHIELD) TABS Take 1 tablet by mouth daily.    Historical Provider, MD  Multiple Vitamin (MULTIVITAMIN) capsule Take 1 capsule by mouth daily.      Historical Provider, MD  ondansetron (ZOFRAN ODT) 4 MG disintegrating tablet Take 1 tablet (4 mg total) by mouth every 8 (eight) hours as needed for nausea or vomiting. Patient not taking: Reported on 09/17/2014 12/03/13   Fredia Sorrow, MD  Polyethyl Glycol-Propyl Glycol (SYSTANE) 0.4-0.3 % GEL Apply 1 drop to eye daily as needed (dry eyes).    Historical Provider, MD  Potassium 99 MG TABS Take 99 mg by mouth daily.     Historical Provider, MD  pregabalin (LYRICA) 100 MG capsule Take 1 capsule (100 mg total) by mouth at bedtime as needed. 06/22/14   Colon Branch, MD  ranitidine (ZANTAC) 300 MG tablet Take 1 tablet (300 mg total) by mouth at bedtime. 05/19/14   Colon Branch, MD    ALLERGIES:  Allergies  Allergen Reactions  . Cefuroxime Axetil     REACTION: urticaria (hives)  . Oxycodone Nausea And Vomiting  . Seldane [Terfenadine]     REACTION: urticaria  (hives)  . Zocor [Simvastatin]     2012 "Muscle breakdown " with profuse sweating  . Tramadol     REACTION: vomitting  . Bactrim [Sulfamethoxazole-Trimethoprim]     See OV 09-15-13, rash-tongue swelling due to bactrim ?  . Pravastatin     "muscle breakdown" with profuse sweating    SOCIAL HISTORY:  Social History  Substance Use Topics  . Smoking status: Former Smoker    Types: Cigarettes  . Smokeless tobacco: Never Used     Comment: Quit in 2001  .  Alcohol Use: No    FAMILY HISTORY: Family History  Problem Relation Age of Onset  . Allergies Sister   . Parkinsonism Sister     possible  . Asthma Sister   . Asthma Paternal Grandmother   . Heart disease Father   . Heart disease Mother   . Lung cancer Mother   . Heart disease      paternal grandparents, maternal grandparents,   . Heart disease Brother   . Emphysema Brother   . Aneurysm Brother     x3  . Kidney failure Brother   . Diabetes Brother   . Breast cancer Neg Hx   . Colon cancer Neg Hx   . Diabetes Brother   . Heart attack Neg Hx   . Stroke Brother   . Stroke Maternal Grandmother   . Stroke Paternal Grandmother     EXAM: BP 132/70 mmHg  Pulse 55  Temp(Src) 98.1 F (36.7 C) (Oral)  Resp 23  Ht 5\' 2"  (1.575 m)  Wt 187 lb (84.823 kg)  BMI 34.19 kg/m2  SpO2 99% CONSTITUTIONAL: Alert and oriented and responds appropriately to questions. Well-appearing; well-nourished HEAD: Normocephalic EYES: Conjunctivae clear, PERRL ENT: normal nose; no rhinorrhea; moist mucous membranes; pharynx without lesions noted NECK: Supple, no meningismus, no LAD  CARD: RRR; S1 and S2 appreciated; no murmurs, no clicks, no rubs, no gallops RESP: Normal chest excursion without splinting or tachypnea; breath sounds clear and equal bilaterally; no wheezes, no rhonchi, no rales, no hypoxia or respiratory distress, speaking full sentences ABD/GI: Normal bowel sounds; non-distended; soft, non-tender, no rebound, no guarding, no  peritoneal signs BACK:  The back appears normal and is non-tender to palpation, there is no CVA tenderness EXT: Normal ROM in all joints; non-tender to palpation; no edema; normal capillary refill; no cyanosis, no calf tenderness or swelling    SKIN: Normal color for age and race; warm NEURO: Moves all extremities equally, sensation to light touch intact diffusely, cranial nerves II through XII intact PSYCH: The patient's mood and manner are appropriate. Grooming and personal hygiene are appropriate.  MEDICAL DECISION MAKING: Patient here with chest pain. EKG shows bradycardia with first-degree AV block which is chronic. No recent provocative testing. Troponin negative. Chest x-ray clear. Admitted for chest pain rule out.      EKG Interpretation  Date/Time:  Tuesday October 19 2014 21:06:56 EDT Ventricular Rate:  57 PR Interval:  210 QRS Duration: 92 QT Interval:  446 QTC Calculation: 434 R Axis:   41 Text Interpretation:  Sinus bradycardia with 1st degree A-V block Otherwise normal ECG No significant change since last tracing Confirmed by WARD,  DO, KRISTEN ([id number]) on 10/20/2014 1:20:37 AM        I personally performed the services described in this documentation, which was scribed in my presence. The recorded information has been reviewed and is accurate.   [place], DO 10/20/14 [phone]

## 2014-10-20 NOTE — ED Notes (Signed)
Patient currently in xray via stretcher.

## 2014-10-20 NOTE — Discharge Summary (Signed)
Physician Discharge Summary  Samantha [name] YWV:[id number] DOB: 21-Nov-1940 DOA: 10/20/2014  PCP: Kathlene November, MD  Admit date: 10/20/2014 Discharge date: 10/20/2014  Time spent: 45 minutes  Recommendations for Outpatient Follow-up:  1. Myoview on 9/27 2. Richardson Dopp [facility] 11/04/2014  Discharge Diagnoses:  Principal Problem:   Chest pain Active Problems:   DM II (diabetes mellitus, type II), w/ neuropathy   HTN (hypertension)   Chronic diastolic CHF (congestive heart failure), NYHA class 2   Pain in the chest   Colon CA  Discharge Condition: stable  Diet recommendation: DIabetic, heart healthy   Filed Weights   10/19/14 2104 10/20/14 0342  Weight: 84.823 kg (187 lb) 87.4 kg (192 lb 10.9 oz)    History of present illness:  Chief Complaint: Chest pain  HPI: Samantha [name] is a 74 y.o. female with Past medical history of ulcerative sleep apnea, dyslipidemia, hypertension, diastolic CHF, neuropathy, GERD. The patient presented with numbness of chest tightness associated with shortness of breath and sweating. His ongoing since last one day. The tightness is currently resolved. The patient mentions of the tightness initially when started was continuous and felt like squeezing pain.  Hospital Course:  Atypical chest pain -suspected to be muscular, her chest wall was mildly tender to palpation and she has been lifting her husband who has MS -EKG normal and Troponins negative -no H/o CAD, but she is a diabetic and hence Cardiology was consulted. -Myoview in 2014 normal -Cardiology recommended outpatient Stress test for next week which was arranged prior to discharge -she is advised to avoid any strenuous activity or exertion pending this stress test  H/o COlon CA -supposed to have partial colectomy in 3 weeks  DM -controlled, resume oral hypoglycemics at discharge  Chronic Diastolic CHF -compensated, resume diuretics at  discharge  Consultations:  Cardiology  Discharge Exam: Filed Vitals:   10/20/14 1613  BP: 127/72  Pulse:   Temp:   Resp:     General: AAOx3 Cardiovascular: S1S2/RRR Respiratory: CTAB  Discharge Instructions   Discharge Instructions    Diet - low sodium heart healthy    Complete by:  As directed      Diet Carb Modified    Complete by:  As directed      Increase activity slowly    Complete by:  As directed           Current Discharge Medication List    CONTINUE these medications which have NOT CHANGED   Details  albuterol (PROVENTIL HFA;VENTOLIN HFA) 108 (90 BASE) MCG/ACT inhaler Inhale 2 puffs into the lungs every 6 (six) hours as needed for wheezing or shortness of breath.    atorvastatin (LIPITOR) 10 MG tablet Take 1 tablet (10 mg total) by mouth daily. Qty: 90 tablet, Refills: 1    butalbital-acetaminophen-caffeine (FIORICET, [place]) 50-325-40 MG per tablet As needed for migraine Qty: 15 tablet, Refills: 3    BYSTOLIC 10 MG tablet TAKE 2 TABLETS BY MOUTH IN THE MORNING AND 1 TABLET IN THE EVENING SPACE 12 HOURS APART Qty: 90 tablet, Refills: 6    CALCIUM PO Take 1 tablet by mouth daily.      cetirizine (ZYRTEC) 10 MG tablet Take 1 tablet (10 mg total) by mouth daily. Qty: 90 tablet, Refills: 1    cholecalciferol (VITAMIN D) 1000 UNITS tablet Take 1,000 Units by mouth daily.    clopidogrel (PLAVIX) 75 MG tablet Take 1 tablet (75 mg total) by mouth daily with breakfast. Qty: 90 tablet, Refills: 3  DULoxetine (CYMBALTA) 60 MG capsule TAKE 1 CAPSULE (60 MG TOTAL) BY MOUTH DAILY. Qty: 30 capsule, Refills: 11    fluticasone (FLONASE) 50 MCG/ACT nasal spray Place 2 sprays into the nose as needed for rhinitis.    furosemide (LASIX) 20 MG tablet Take 1 tablet (20 mg total) by mouth 2 (two) times daily. Qty: 30 tablet, Refills: 6    hydrALAZINE (APRESOLINE) 25 MG tablet Take 1 tablet (25 mg total) by mouth 3 (three) times daily. Qty: 270 tablet, Refills: 6    Associated Diagnoses: Essential hypertension    lisinopril (PRINIVIL,ZESTRIL) 40 MG tablet Take 1 tablet (40 mg total) by mouth daily. Qty: 90 tablet, Refills: 1    metFORMIN (GLUCOPHAGE-XR) 500 MG 24 hr tablet Take 1 tablet (500 mg total) by mouth daily with breakfast. Qty: 90 tablet, Refills: 1    Misc Natural Products (OSTEO BI-FLEX JOINT SHIELD) TABS Take 1 tablet by mouth daily.    Multiple Vitamin (MULTIVITAMIN) capsule Take 1 capsule by mouth daily.      Polyethyl Glycol-Propyl Glycol (SYSTANE) 0.4-0.3 % GEL Apply 1 drop to eye daily as needed (dry eyes).    Potassium 99 MG TABS Take 99 mg by mouth daily.     pregabalin (LYRICA) 100 MG capsule Take 1 capsule (100 mg total) by mouth at bedtime as needed. Qty: 30 capsule, Refills: 6    ranitidine (ZANTAC) 300 MG tablet Take 1 tablet (300 mg total) by mouth at bedtime. Qty: 90 tablet, Refills: 1    mesalamine (CANASA) 1000 MG suppository Place 1 suppository (1,000 mg total) rectally at bedtime. Qty: 30 suppository, Refills: 1    ondansetron (ZOFRAN ODT) 4 MG disintegrating tablet Take 1 tablet (4 mg total) by mouth every 8 (eight) hours as needed for nausea or vomiting. Qty: 12 tablet, Refills: 1      STOP taking these medications     ciprofloxacin (CIPRO) 500 MG tablet        Allergies  Allergen Reactions  . Cefuroxime Axetil     REACTION: urticaria (hives)  . Oxycodone Nausea And Vomiting  . Seldane [Terfenadine]     REACTION: urticaria (hives)  . Zocor [Simvastatin]     2012 "Muscle breakdown " with profuse sweating  . Tramadol     REACTION: vomitting  . Bactrim [Sulfamethoxazole-Trimethoprim]     See OV 09-15-13, rash-tongue swelling due to bactrim ?  . Pravastatin     "muscle breakdown" with profuse sweating   Follow-up Information    Follow up with [facility] On 10/26/2014.   Why:  Nuclear Stress Test on 10/26/2014 at 11:45AM. Nothing to eat after midnight prior to the test. Only sips of water  with medication.   Contact information:   [address] [place] [address]       Follow up with Richardson Dopp, PA-C On 11/04/2014.   Specialties:  Physician Assistant, Radiology, Interventional Cardiology   Why:  Cardiology Hospital Follow-Up and to review stress test results on 11/04/2014 at 8:50AM.   Contact information:   [address] [place] [address] [phone]        The results of significant diagnostics from this hospitalization (including imaging, microbiology, ancillary and laboratory) are listed below for reference.    Significant Diagnostic Studies: Dg Chest 2 View  10/20/2014   CLINICAL DATA:  74 year old female with chest pain  EXAM: CHEST  2 VIEW  COMPARISON:  None.  FINDINGS: The heart size and  mediastinal contours are within normal limits. Both lungs are clear. The visualized skeletal structures are unremarkable. Partially visualized cervical spine anterior fixation plate and screws.  IMPRESSION: No active cardiopulmonary disease.   Electronically Signed   By: Anner Crete M.D.   On: 10/20/2014 02:45   Dg Chest 2 View  10/19/2014   CLINICAL DATA:  Chest pain  EXAM: CHEST  2 VIEW  COMPARISON:  09/17/2014  FINDINGS: Cardiomediastinal silhouette is stable. No acute infiltrate or pleural effusion. No pulmonary edema. Degenerative changes thoracic spine again noted. Metallic fixation material cervical spine.  IMPRESSION: No active cardiopulmonary disease.   Electronically Signed   By: Lahoma Crocker M.D.   On: 10/19/2014 20:15    Microbiology: No results found for this or any previous visit (from the past 240 hour(s)).   Labs: Basic Metabolic Panel:  Recent Labs Lab 10/19/14 2129 10/20/14 0456  NA 139 141  K 3.8 3.6  CL 103 109  CO2 28 26  GLUCOSE 99 105*  BUN 15 14  CREATININE 0.87 0.73  CALCIUM 9.1 8.9   Liver Function Tests:  Recent Labs Lab 10/20/14 0456  AST 19  ALT 17  ALKPHOS 47  BILITOT 0.5   PROT 6.5  ALBUMIN 3.5   No results for input(s): LIPASE, AMYLASE in the last 168 hours. No results for input(s): AMMONIA in the last 168 hours. CBC:  Recent Labs Lab 10/19/14 2129 10/20/14 0456  WBC 4.1 3.5*  NEUTROABS  --  1.4*  HGB 12.6 11.9*  HCT 38.5 36.0  MCV 85.9 84.9  PLT 161 131*   Cardiac Enzymes:  Recent Labs Lab 10/20/14 0456 10/20/14 0958  TROPONINI <0.03 <0.03   BNP: BNP (last 3 results)  Recent Labs  10/19/14 2129  BNP 54.1    ProBNP (last 3 results) No results for input(s): PROBNP in the last 8760 hours.  CBG:  Recent Labs Lab 10/20/14 0439 10/20/14 0633 10/20/14 1119 10/20/14 1611  GLUCAP 92 104* 100* 108*       Signed:  JOSEPH,PREETHA  [facility] 10/20/2014, 5:12 PM

## 2014-10-20 NOTE — Consult Note (Signed)
CARDIOLOGY CONSULT NOTE   Patient ID: Samantha [name] MRN: [id number], DOB/AGE: March 15, 1940   Admit date: 10/20/2014 Date of Consult: 10/20/2014   Primary Physician: Kathlene November, MD Primary Cardiologist: [name] Reason for Consult: Chest Pain  HPI: Samantha [name] is a 74 y.o. female with past medical history of Chronic diastolic CHF, Type 2 DM, HTN, OSA, HLD, Fibromyalgia, TIA (on Plavix), colon cancer (planned partial colectomy 11/11/2014), and palpitations who presented to [facility] ED as a transfer from Urgent Care on 10/19/2014 for chest pain over the past 4 days along with nausea, and diaphoresis.   The patient reports she had been helping care for her husband who has MS on Friday by lifting him with the assistance of a nursing aide and repositioning him throughout the day, when she developed chest pain that evening. She reports it was mostly in her sternal and right-sided pectoral region. She took a Tylenol that evening with minimal relief. The pain she describes as a pressure persisted throughout the weekend and was a 10/10 at its worse and was constant. She reports the pain was worse when she took a deep breath and her chest and shoulders were tender to touch. She also had pain in her right and left upper arms, but that resolved over the weekend. In addition, she also had nausea and diarrhea over the weekend, but she has known colorectal cancer and has a planned partial colectomy in October. She was evaluated by Urgent Care initially for her pain on 10/19/2014 and they sent her to the ED for further evaluation.   Upon arrival to the ED, she reports her pain was a 3/10 and was completely relieved with NTG and ASA.  Upon admission, she was without chest pain and says she has minimal, if any pain at this time. Denies any nausea, vomiting, or shortness of breath at this time.   Cyclic troponin values have been negative. EKG imaging shows 1st degree AV block which is noted on previous  tracings. Echocardiogram performed today shows EF of 60 - 65% with no regional wall motion abnormalities.Left ventricle showed focal basal and moderate concentric hypertrophy along with grade 1 diastolic dysfunction which is consistent with previous echo in 03/2012.  She had a normal nuclear stress test in 08/2012. She was last seen by [name] in 03/2014 and doing well at that time. Was having palpitations about once per week, lasting only a few seconds. BP under good control after adding Hydralazine 25mg  TID.  The patient is scheduled to undergo a robotic partial colectomy for colon cancer on 11/11/2014. [name] was contacted on 09/08/2014 about cardiac clearance and said there were no contraindications for surgery from a cardiac standpoint at that time.   Problem List Past Medical History  Diagnosis Date  . OSA (obstructive sleep apnea) 09/2007    dx w/ a sleep study, not on  CPAP  . Hyperlipidemia   . Hypertension   . Chest pain, atypical 11/2006    saw cards: neg/normal Holter, ECHo and myoview, admitted w/ CP 4/09 neg enz.and CT chest had a 9 beat NSVT  . Fibromyalgia   . Abnormal CT of the chest 2008    last CT4-l 2009:  . No f/u suggested   . 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 Shingles 11/2009  . Vaginal dysplasia   . Vaginal cancer 1994  . Type II diabetes mellitus   . Heart murmur   . CHF (  congestive heart failure)   . Pneumonia     "double" (06/27/2012)  . Asthma   . H/O hiatal hernia   . GERD (gastroesophageal reflux disease)   . Ocular migraine   . TIA (transient ischemic attack) 06/27/2012    "this is my first" (06/27/2012)  . Rheumatoid factor positive   . Osteoarthritis   . Reactive airway disease 01/29/2002    dx of pseudoasthma / vcd in 2005 and nl sprirometry History of dyspnea, 2011,  improved after several medications were changed around Question of COPD, disproved July 06, 2009 with nl pft's      . TIA (transient ischemic attack)     x2   . Malignant neoplasm of ascending colon 2016    Minimally invasive right hemicolectomy    Past Surgical History  Procedure Laterality Date  . Thyroidectomy, partial  1960's  . Bunionectomy Left ~ 1977  . Breast biopsy Right 1999  . Anterior cervical decomp/discectomy fusion  2001  . Cataract extraction w/ intraocular lens  implant, bilateral  2012  . Vaginal mass excision  1994    "Laser surgery for vaginal cancer; followed by chemotherapy" (06/27/2012)  . Abdominal hysterectomy  1980    NO oophorectomy per pt      Allergies Allergies  Allergen Reactions  . Cefuroxime Axetil     REACTION: urticaria (hives)  . Oxycodone Nausea And Vomiting  . Seldane [Terfenadine]     REACTION: urticaria (hives)  . Zocor [Simvastatin]     2012 "Muscle breakdown " with profuse sweating  . Tramadol     REACTION: vomitting  . Bactrim [Sulfamethoxazole-Trimethoprim]     See OV 09-15-13, rash-tongue swelling due to bactrim ?  . Pravastatin     "muscle breakdown" with profuse sweating      Inpatient Medications . aspirin EC  325 mg Oral Daily  . atorvastatin  10 mg Oral q1800  . clopidogrel  75 mg Oral Q breakfast  . DULoxetine  60 mg Oral Daily  . enoxaparin (LOVENOX) injection  40 mg Subcutaneous Q24H  . famotidine  20 mg Oral QHS  . furosemide  20 mg Oral BID  . hydrALAZINE  25 mg Oral TID  . insulin aspart  0-15 Units Subcutaneous Q6H  . lisinopril  40 mg Oral Daily  . nebivolol  10 mg Oral BID    Family History Family History  Problem Relation Age of Onset  . Allergies Sister   . Parkinsonism Sister     possible  . Asthma Sister   . Asthma Paternal Grandmother   . Heart disease Father   . Heart disease Mother   . Lung cancer Mother   . Heart disease      paternal grandparents, maternal grandparents,   . Heart disease Brother   . Emphysema Brother   . Aneurysm Brother     x3  . Kidney failure Brother   . Diabetes Brother   . Breast cancer Neg Hx   . Colon cancer Neg  Hx   . Diabetes Brother   . Heart attack Neg Hx   . Stroke Brother   . Stroke Maternal Grandmother   . Stroke Paternal Grandmother      Social History Social History   Social History  . Marital Status: Married    Spouse Name: Ilona Sorrel  . Number of Children: 2  . Years of Education: masters   Occupational History  . Retired, disable since 2000    Social History Main Topics  .  Smoking status: Former Smoker    Types: Cigarettes  . Smokeless tobacco: Never Used     Comment: Quit in 2001  . Alcohol Use: No  . Drug Use: No  . Sexual Activity: No   Other Topics Concern  . Not on file   Social History Narrative   On disability since 2000--- also husband has MS   Education. College.   Right handed.     Review of Systems General:  No chills, fever, night sweats or weight changes.  Cardiovascular:  No dyspnea on exertion, edema, orthopnea, palpitations, paroxysmal nocturnal dyspnea. Positive for chest pain. Dermatological: No rash, lesions/masses Respiratory: No cough, dyspnea Urologic: No hematuria, dysuria Abdominal:   No vomiting, diarrhea, melena, or hematemesis. Positive for nausea and hematochezia. Neurologic:  No visual changes, wkns, changes in mental status. All other systems reviewed and are otherwise negative except as noted above.  Physical Exam Blood pressure 125/74, pulse 51, temperature 97.5 F (36.4 C), temperature source Oral, resp. rate 16, height 5\' 2"  (1.575 m), weight 192 lb 10.9 oz (87.4 kg), SpO2 100 %.  General: Pleasant, friendly female in NAD Psych: Normal affect. Neuro: Alert and oriented X 3. Moves all extremities spontaneously. HEENT: Normal  Neck: Supple without bruits or JVD. Lungs:  Resp regular and unlabored, CTA without wheezing or rales. Heart: RRR no s3, s4, or murmurs. Abdomen: Soft, non-tender, non-distended, BS + x 4.  Extremities: No clubbing, cyanosis or edema. DP/PT/Radials 2+ and equal bilaterally.  Labs   Recent Labs   10/20/14 0456 10/20/14 0958  TROPONINI <0.03 <0.03   Lab Results  Component Value Date   WBC 3.5* 10/20/2014   HGB 11.9* 10/20/2014   HCT 36.0 10/20/2014   MCV 84.9 10/20/2014   PLT 131* 10/20/2014    Recent Labs Lab 10/20/14 0456  NA 141  K 3.6  CL 109  CO2 26  BUN 14  CREATININE 0.73  CALCIUM 8.9  PROT 6.5  BILITOT 0.5  ALKPHOS 47  ALT 17  AST 19  GLUCOSE 105*   Lab Results  Component Value Date   CHOL 171 05/19/2014   HDL 65.50 05/19/2014   LDLCALC 83 05/19/2014   TRIG 112.0 05/19/2014   Lab Results  Component Value Date   DDIMER 0.64* 01/23/2013    Radiology/Studies Dg Chest 2 View: 10/20/2014   CLINICAL DATA:  74 year old female with chest pain  EXAM: CHEST  2 VIEW  COMPARISON:  None.  FINDINGS: The heart size and mediastinal contours are within normal limits. Both lungs are clear. The visualized skeletal structures are unremarkable. Partially visualized cervical spine anterior fixation plate and screws.  IMPRESSION: No active cardiopulmonary disease.   Electronically Signed   By: Anner Crete M.D.   On: 10/20/2014 02:45   Dg Chest 2 View: 10/19/2014   CLINICAL DATA:  Chest pain  EXAM: CHEST  2 VIEW  COMPARISON:  09/17/2014  FINDINGS: Cardiomediastinal silhouette is stable. No acute infiltrate or pleural effusion. No pulmonary edema. Degenerative changes thoracic spine again noted. Metallic fixation material cervical spine.  IMPRESSION: No active cardiopulmonary disease.   Electronically Signed   By: Lahoma Crocker M.D.   On: 10/19/2014 20:15    EKG: Sinus rhythm with 1st degree AV block. Rate in 50's - 70's.   ECHO: 10/20/2014 Study Conclusions - Left ventricle: The cavity size was normal. There was focal basal and moderate concentric hypertrophy. Systolic function was normal. The estimated ejection fraction was in the range of 60% to 65%. Wall  motion was normal; there were no regional wall motion abnormalities. Doppler parameters are consistent  with abnormal left ventricular relaxation (grade 1 diastolic dysfunction).  ASSESSMENT AND PLAN  1. Chest Pain - likely MSK etiology for onset occurred after lifting and repositioning her husband. Reports her chest and shoulders were tender to touch over the weekend. Was also worse with deep breathing. However, it has been relieved with NTG administration. - now without chest pain. Not tender to palpation along chest wall. - last nuclear stress test in 08/2012 showed no signs of ischemia. - cyclic troponin values have been negative and EKG without acute changes. - Echo shows normal EF with no regional wall motion abnormalities. - could consider repeat NST as Inpatient vs. Outpatient. Patient wants to make sure she is fully cleared for her colon surgery in October 2016. - continue ASA (switch to 81mg ), Plavix (due to history of TIA's), and BB  2. Chronic Diastolic Heart Failure - does not appear volume overloaded on exam - continue home Lasix   3. HTN - BP has been 125/62 - 162/80 in the past 24 hours - continue current medication regimen.  4. HLD - continue statin therapy  5. Colon Cancer - planning to have robotic partial colectomy of ascending colon on 11/11/2014.   Signed, Erma Heritage, PA-C 10/20/2014, 2:12 PM Pager: [phone]  Patient seen and examined. Agree with assessment and plan. Very pleasant 74 yo AAF who has a history of hypertension with chronic diastolic CHF, Type 2 DM, OSA, HLD, Fibromyalgia, TIA (on Plavix), palpitations who will be undergoing partial colectomy for colon CA in 3 weeks.  Her present chest pain commenced after attempting to lift and move her husband who has debilitating MS.  Her chest pain had been constant. She has residual costochondral tenderness on exam. No JVD, lungs are clear, 1/6 sem on exam; no rub, thrill or heaves. Abd nontender; no edema. ECG is unremarkable with sinus bradycardia and first degree heart block without STT  changes.  Suspect musculoskeletal etiology to chest pain. She had a normal nuclear study in 2014.  OK from cardiac perspective to dc today, but since she will be undergoing general anesthesia in several weeks will arrange for outpatient lexiscan nuclear study next Tuesday 9/27 for pre-operative screening and ov f/u evaluation in 2 weeks.  Samantha Sine, MD, [facility] 10/20/2014 4:44 PM

## 2014-10-20 NOTE — Progress Notes (Signed)
Discharge instructions reviewed with patient At 1850pm, voiced understanding, questions answered.  Mervyn Skeeters, RN

## 2014-10-20 NOTE — Progress Notes (Signed)
Pt seen and examined, 14/C with DM, diastolic CHF, HTN admitted with Atypical chest pain for 2-3days now resolved EKG unremarkable and troponins negative x2 -Myoview in 2014 normal -Will ask Cards to eval -ECHO pending  Domenic Polite, MD [phone]

## 2014-10-20 NOTE — Progress Notes (Signed)
Pt's troponin increased to 0.03.  Text paged Dr.Patel.  Will await orders.

## 2014-10-21 ENCOUNTER — Telehealth (HOSPITAL_COMMUNITY): Payer: Self-pay | Admitting: *Deleted

## 2014-10-21 NOTE — Telephone Encounter (Signed)
Patient given detailed instructions per Myocardial Perfusion Study Information Sheet for test on 10/26/14 at 1145. Patient notified to arrive 15 minutes early and that it is imperative to arrive on time for appointment to keep from having the test rescheduled.  If you need to cancel or reschedule your appointment, please call the office within 24 hours of your appointment. Failure to do so may result in a cancellation of your appointment, and a $50 no show fee. Patient verbalized understanding. Hubbard Robinson, RN

## 2014-10-22 ENCOUNTER — Telehealth: Payer: Self-pay | Admitting: *Deleted

## 2014-10-22 NOTE — Telephone Encounter (Signed)
Transition Care Management Follow-up Telephone Call   Date discharged? 10/20/14   How have you been since you were released from the hospital? No more chest pressure, tightness, shortness of breath, or pain    Do you understand why you were in the hospital? YES- thought she had pneumonia, but hospital thought she had MI- having a test done with cardiology just to make sure    Do you understand the discharge instructions? Yes- resume activity slowly    Where were you discharged to? Home    Items Reviewed:  Medications reviewed: yes  Allergies reviewed: yes- patient also states she is very sensitive to dust   Dietary changes reviewed: yes- low sodium, low carb diet   Referrals reviewed: yes- patient has stress test scheduled 10/26/14   Functional Questionnaire:   Activities of Daily Living (ADLs):   She states they are independent in the following: ambulation, bathing and hygiene, feeding, continence, grooming, toileting and dressing States they require assistance with the following: none- daughter is available if needed   Any transportation issues/concerns?: no   Any patient concerns? no   Confirmed importance and date/time of follow-up visits scheduled yes 10/27/24 at 11:30  Provider Appointment booked with Dr. Larose Kells  Confirmed with patient if condition begins to worsen call PCP or go to the ER.  Patient was given the office number and encouraged to call back with question or concerns.  : yes

## 2014-10-25 ENCOUNTER — Telehealth: Payer: Self-pay | Admitting: *Deleted

## 2014-10-25 NOTE — Telephone Encounter (Signed)
FYI- Patient called stating that she has a bruise where she got an injection in the hospital.  She states it was Lovenox and the bruise is on her abdomen.  It is not painful.  It is about the size of her palm. Notified patient that this injection commonly causes a bruise, to outline border and note changes and notify office if grows, becomes painful or hard.  Patient stated understanding and agreed.    Patient has hospital f/u 10/28/14 and she states she will have MD look at bruise at this appointment unless it changes.

## 2014-10-25 NOTE — Telephone Encounter (Signed)
thx

## 2014-10-26 ENCOUNTER — Ambulatory Visit (HOSPITAL_COMMUNITY): Payer: Medicare Other | Attending: Student

## 2014-10-26 DIAGNOSIS — E119 Type 2 diabetes mellitus without complications: Secondary | ICD-10-CM | POA: Diagnosis not present

## 2014-10-26 DIAGNOSIS — R0609 Other forms of dyspnea: Secondary | ICD-10-CM | POA: Insufficient documentation

## 2014-10-26 DIAGNOSIS — R079 Chest pain, unspecified: Secondary | ICD-10-CM | POA: Diagnosis not present

## 2014-10-26 DIAGNOSIS — I10 Essential (primary) hypertension: Secondary | ICD-10-CM | POA: Insufficient documentation

## 2014-10-26 DIAGNOSIS — R110 Nausea: Secondary | ICD-10-CM | POA: Insufficient documentation

## 2014-10-26 DIAGNOSIS — R42 Dizziness and giddiness: Secondary | ICD-10-CM | POA: Insufficient documentation

## 2014-10-26 DIAGNOSIS — R9439 Abnormal result of other cardiovascular function study: Secondary | ICD-10-CM | POA: Insufficient documentation

## 2014-10-26 DIAGNOSIS — R002 Palpitations: Secondary | ICD-10-CM | POA: Diagnosis not present

## 2014-10-26 DIAGNOSIS — R0602 Shortness of breath: Secondary | ICD-10-CM | POA: Diagnosis not present

## 2014-10-26 LAB — MYOCARDIAL PERFUSION IMAGING
CHL CUP NUCLEAR SDS: 1
CHL CUP NUCLEAR SRS: 1
CHL CUP RESTING HR STRESS: 56 {beats}/min
LV dias vol: 67 mL
LV sys vol: 20 mL
Peak HR: 90 {beats}/min
RATE: 0.33
SSS: 1
TID: 0.8

## 2014-10-26 MED ORDER — TECHNETIUM TC 99M SESTAMIBI GENERIC - CARDIOLITE
30.6000 | Freq: Once | INTRAVENOUS | Status: AC | PRN
  Administered 2014-10-26: 31 via INTRAVENOUS

## 2014-10-26 MED ORDER — REGADENOSON 0.4 MG/5ML IV SOLN
0.4000 mg | Freq: Once | INTRAVENOUS | Status: AC
  Administered 2014-10-26: 0.4 mg via INTRAVENOUS

## 2014-10-26 MED ORDER — TECHNETIUM TC 99M SESTAMIBI GENERIC - CARDIOLITE
10.7000 | Freq: Once | INTRAVENOUS | Status: AC | PRN
  Administered 2014-10-26: 11 via INTRAVENOUS

## 2014-10-28 ENCOUNTER — Ambulatory Visit: Payer: Medicare Other | Admitting: Internal Medicine

## 2014-10-28 ENCOUNTER — Telehealth: Payer: Self-pay | Admitting: Internal Medicine

## 2014-10-28 NOTE — Telephone Encounter (Signed)
Pt was no show 10/28/14 11:30am for hospital f/u, pt was rescheduled for 11/02/14, charge for no show?

## 2014-10-29 NOTE — Telephone Encounter (Signed)
No, thx 

## 2014-10-30 HISTORY — PX: COLON SURGERY: SHX602

## 2014-11-02 ENCOUNTER — Other Ambulatory Visit (HOSPITAL_COMMUNITY): Payer: Self-pay | Admitting: *Deleted

## 2014-11-02 ENCOUNTER — Ambulatory Visit (INDEPENDENT_AMBULATORY_CARE_PROVIDER_SITE_OTHER): Payer: Medicare Other | Admitting: Internal Medicine

## 2014-11-02 ENCOUNTER — Encounter: Payer: Self-pay | Admitting: Internal Medicine

## 2014-11-02 VITALS — BP 126/82 | HR 62 | Temp 98.1°F | Ht 62.0 in | Wt 195.5 lb

## 2014-11-02 DIAGNOSIS — R5382 Chronic fatigue, unspecified: Secondary | ICD-10-CM

## 2014-11-02 DIAGNOSIS — E119 Type 2 diabetes mellitus without complications: Secondary | ICD-10-CM

## 2014-11-02 DIAGNOSIS — R079 Chest pain, unspecified: Secondary | ICD-10-CM | POA: Diagnosis not present

## 2014-11-02 DIAGNOSIS — Z09 Encounter for follow-up examination after completed treatment for conditions other than malignant neoplasm: Secondary | ICD-10-CM

## 2014-11-02 LAB — VITAMIN B12: Vitamin B-12: 345 pg/mL (ref 211–911)

## 2014-11-02 LAB — VITAMIN D 25 HYDROXY (VIT D DEFICIENCY, FRACTURES): VITD: 28.1 ng/mL — ABNORMAL LOW (ref 30.00–100.00)

## 2014-11-02 LAB — FOLATE: FOLATE: 18.8 ng/mL (ref >5.9)

## 2014-11-02 LAB — TSH: TSH: 2.06 u[IU]/mL (ref 0.35–4.50)

## 2014-11-02 LAB — HEMOGLOBIN A1C: HEMOGLOBIN A1C: 6.2 % (ref 4.6–6.5)

## 2014-11-02 NOTE — Progress Notes (Signed)
Subjective:    Patient ID: Samantha [name], female    DOB: 1940-12-27, 74 y.o.   MRN: [id number]  DOS:  11/02/2014 Type of visit - description : Hospital follow-up Interval history: Admitted to hospital 10/20/2014 with chest pain, felt to be atypical, likely MSK. She was advised to have a stress test as an outpatient -----> low risk but + small area of ischemia. Other problems were stable Labs reviewed: BMP at baseline, hemoglobin 11.9, slightly low, know history of colon cancer , to have surgery few weeks.    Review of Systems Since he left the hospital, chest pain is not as intense or frequent, last episode yesterday. Denies shortness of breath, occasionally has ankle edema. Had diarrhea 2 days ago but that is resolved. No heartburn No cough or wheezing. Continue complained about fatigue, mostly at the end of the day.  Past Medical History  Diagnosis Date  . OSA (obstructive sleep apnea) 09/2007    dx w/ a sleep study, not on  CPAP  . Hyperlipidemia   . Hypertension   . Chest pain, atypical 11/2006    saw cards: neg/normal Holter, ECHo and myoview, admitted w/ CP 4/09 neg enz.and CT chest had a 9 beat NSVT  . Fibromyalgia   . Abnormal CT of the chest 2008    last CT4-l 2009:  . No f/u suggested   . 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 Shingles 11/2009  . Vaginal dysplasia   . 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 Type II diabetes mellitus ([place])   . Heart murmur   . CHF (congestive heart failure) ([place])   . Pneumonia     "double" (06/27/2012)  . Asthma   . H/O hiatal hernia   . GERD (gastroesophageal reflux disease)   . Ocular migraine   . TIA (transient ischemic attack) 06/27/2012    "this is my first" (06/27/2012)  . Rheumatoid factor positive   . Osteoarthritis   . Reactive airway disease 01/29/2002    dx of pseudoasthma / vcd in 2005 and nl sprirometry History of dyspnea, 2011,  improved after several medications were changed around Question of COPD,  disproved July 06, 2009 with nl pft's      . TIA (transient ischemic attack)     x2  . 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 Thyroidectomy, partial  1960's  . Bunionectomy Left ~ 1977  . Breast biopsy Right 1999  . Anterior cervical decomp/discectomy fusion  2001  . Cataract extraction w/ intraocular lens  implant, bilateral  2012  . Vaginal mass excision  1994    "Laser surgery for vaginal cancer; followed by chemotherapy" (06/27/2012)  . Abdominal hysterectomy  1980    NO oophorectomy per pt     Social History   Social History  . Marital Status: Married    Spouse Name: Ilona Sorrel  . Number of Children: 2  . Years of Education: masters   Occupational History  . Retired, disable since 2000    Social History Main Topics  . Smoking status: Former Smoker    Types: Cigarettes  . Smokeless tobacco: Never Used     Comment: Quit in 2001  . Alcohol Use: No  . Drug Use: No  . Sexual Activity: No   Other Topics Concern  . Not on file   Social History Narrative  On disability since 2000--- also husband has MS   Education. College.   Right handed.        Medication List       This list is accurate as of: 11/02/14  9:26 PM.  Always use your most recent med list.               acetaminophen 500 MG tablet  Commonly known as:  TYLENOL  Take 1,000 mg by mouth every 6 (six) hours as needed for moderate pain.     albuterol 108 (90 BASE) MCG/ACT inhaler  Commonly known as:  PROVENTIL HFA;VENTOLIN HFA  Inhale 2 puffs into the lungs every 6 (six) hours as needed for wheezing or shortness of breath.     atorvastatin 10 MG tablet  Commonly known as:  LIPITOR  Take 1 tablet (10 mg total) by mouth daily.     butalbital-acetaminophen-caffeine 50-325-40 MG tablet  Commonly known as:  FIORICET, ESGIC  As needed for migraine     BYSTOLIC 10 MG tablet  Generic drug:   nebivolol  TAKE 2 TABLETS BY MOUTH IN THE MORNING AND 1 TABLET IN THE EVENING SPACE 12 HOURS APART     CALCIUM PO  Take 1 tablet by mouth every morning.     cetirizine 10 MG tablet  Commonly known as:  ZYRTEC  Take 1 tablet (10 mg total) by mouth daily.     cholecalciferol 1000 UNITS tablet  Commonly known as:  VITAMIN D  Take 1,000 Units by mouth every morning.     clopidogrel 75 MG tablet  Commonly known as:  PLAVIX  Take 1 tablet (75 mg total) by mouth daily with breakfast.     DULoxetine 60 MG capsule  Commonly known as:  CYMBALTA  TAKE 1 CAPSULE (60 MG TOTAL) BY MOUTH DAILY.     fluticasone 50 MCG/ACT nasal spray  Commonly known as:  FLONASE  Place 2 sprays into the nose daily as needed for allergies or rhinitis.     furosemide 20 MG tablet  Commonly known as:  LASIX  Take 1 tablet (20 mg total) by mouth 2 (two) times daily.     hydrALAZINE 25 MG tablet  Commonly known as:  APRESOLINE  Take 1 tablet (25 mg total) by mouth 3 (three) times daily.     lisinopril 40 MG tablet  Commonly known as:  PRINIVIL,ZESTRIL  Take 1 tablet (40 mg total) by mouth daily.     metFORMIN 500 MG 24 hr tablet  Commonly known as:  GLUCOPHAGE-XR  Take 1 tablet (500 mg total) by mouth daily with breakfast.     multivitamin capsule  Take 1 capsule by mouth daily.     ondansetron 4 MG disintegrating tablet  Commonly known as:  ZOFRAN ODT  Take 1 tablet (4 mg total) by mouth every 8 (eight) hours as needed for nausea or vomiting.     OSTEO BI-FLEX JOINT SHIELD Tabs  Take 1 tablet by mouth daily.     Potassium 99 MG Tabs  Take 99 mg by mouth daily.     pregabalin 100 MG capsule  Commonly known as:  LYRICA  Take 1 capsule (100 mg total) by mouth at bedtime as needed.     ranitidine 300 MG tablet  Commonly known as:  ZANTAC  Take 1 tablet (300 mg total) by mouth at bedtime.     SYSTANE 0.4-0.3 % Gel ophthalmic gel  Generic drug:  Polyethyl Glycol-Propyl Glycol  Apply 1 drop  to  eye daily as needed (dry eyes).           Objective:   Physical Exam BP 126/82 mmHg  Pulse 62  Temp(Src) 98.1 F (36.7 C) (Oral)  Ht 5\' 2"  (1.575 m)  Wt 195 lb 8 oz (88.678 kg)  BMI 35.75 kg/m2  SpO2 96% General:   Well developed, well nourished . NAD.  HEENT:  Normocephalic . Face symmetric, atraumatic Lungs:  CTA B Normal respiratory effort, no intercostal retractions, no accessory muscle use. Heart: RRR,  no murmur.  no pretibial edema bilaterally  Abdomen:  Not distended, soft, non-tender. No rebound or rigidity. No mass,organomegaly Skin: Not pale. Not jaundice Neurologic:  alert & oriented X3.  Speech normal, gait appropriate for age and unassisted Psych--  Cognition and judgment appear intact.  Cooperative with normal attention span and concentration.  Behavior appropriate. No anxious or depressed appearing.    Assessment & Plan:   Assessment > DM HTN Hyperlipidemia Asthma Colon Ca dx 07-3024 CV --CHF --PVCs  --TIA 05-2012 , saw neuro, ASA changed to plavix MSK: --DJD --Fibromyalgia --h/o cspine surgery H/o ocular migraine  Plan  Chest pain: Atypical , stress test 10/26/2014 show a small area of ischemia but overall was low risk. Has an appointment to see cardiology this week. Colon cancer: in  need of surgery in the next few days, hopefully cardiology will be able to clear her. Fatigue: Ongoing issue, no clear etiology, but has a history of a sleep apnea (untreated, intolerant to CPAP) and  fibromyalgia with pain well controlled with Lyrica and fluoxetine.  Plan: TSH, vitamin D, O32, folic acid DM: Check N1B

## 2014-11-02 NOTE — Patient Instructions (Addendum)
Get your blood work before you leave     Next visit  for a routine checkup in 3 months  (30 minutes) Please schedule an appointment at the front desk Fasting is optional

## 2014-11-02 NOTE — Progress Notes (Signed)
Pre visit review using our clinic review tool, if applicable. No additional management support is needed unless otherwise documented below in the visit note. 

## 2014-11-02 NOTE — Patient Instructions (Addendum)
Eleina Jergens Hinely  11/02/2014   Your procedure is scheduled on: 11-11-14  Report to [facility] Main  Entrance take [facility]  elevators to 3rd floor to  [place] at 530 AM.  Call this number if you have problems the morning of surgery [phone]   Remember: ONLY 1 PERSON MAY GO WITH YOU TO SHORT STAY TO GET  READY MORNING OF [place].  Do not eat food:After Midnight Tuesday night, follow all bowel prep instructions with clear liquids all day Wednesday 11-10-14 per Dr. Marcello Moores instructions, no clear liquids after midnight..     Take these medicines the morning of surgery with A SIP OF WATER: ALBUTEROL INHALER IF NEEDED AND BRING INHALER WITH YOU, BYSTOLIC, ZYRTEC, DULOXETINE , FLONASE NASAL SPRAY IF NEEDED DO NOT TAKE ANY DIABETIC MEDICATIONS DAY OF YOUR SURGERY                               You may not have any metal on your body including hair pins and              piercings  Do not wear jewelry, make-up, lotions, powders or perfumes, deodorant             Do not wear nail polish.  Do not shave  48 hours prior to surgery.              Men may shave face and neck.   Do not bring valuables to the hospital. [place].  Contacts, dentures or bridgework may not be worn into surgery.  Leave suitcase in the car. After surgery it may be brought to your room.     Patients discharged the day of surgery will not be allowed to drive home.  Name and phone number of your driver:  Special Instructions: N/A              Please read over the following fact sheets you were given: _____________________________________________________________________             [facility] - Preparing for Surgery Before surgery, you can play an important role.  Because skin is not sterile, your skin needs to be as free of germs as possible.  You can reduce the number of germs on your skin by washing with CHG (chlorahexidine  gluconate) soap before surgery.  CHG is an antiseptic cleaner which kills germs and bonds with the skin to continue killing germs even after washing. Please DO NOT use if you have an allergy to CHG or antibacterial soaps.  If your skin becomes reddened/irritated stop using the CHG and inform your nurse when you arrive at Short Stay. Do not shave (including legs and underarms) for at least 48 hours prior to the first CHG shower.  You may shave your face/neck. Please follow these instructions carefully:  1.  Shower with CHG Soap the night before surgery and the  morning of Surgery.  2.  If you choose to wash your hair, wash your hair first as usual with your  normal  shampoo.  3.  After you shampoo, rinse your hair and body thoroughly to remove the  shampoo.  4.  Use CHG as you would any other liquid soap.  You can apply chg directly  to the skin and wash                       Gently with a scrungie or clean washcloth.  5.  Apply the CHG Soap to your body ONLY FROM THE NECK DOWN.   Do not use on face/ open                           Wound or open sores. Avoid contact with eyes, ears mouth and genitals (private parts).                       Wash face,  Genitals (private parts) with your normal soap.             6.  Wash thoroughly, paying special attention to the area where your surgery  will be performed.  7.  Thoroughly rinse your body with warm water from the neck down.  8.  DO NOT shower/wash with your normal soap after using and rinsing off  the CHG Soap.                9.  Pat yourself dry with a clean towel.            10.  Wear clean pajamas.            11.  Place clean sheets on your bed the night of your first shower and do not  sleep with pets. Day of Surgery : Do not apply any lotions/deodorants the morning of surgery.  Please wear clean clothes to the hospital/surgery center.  FAILURE TO FOLLOW THESE INSTRUCTIONS MAY RESULT IN THE CANCELLATION OF YOUR  SURGERY PATIENT SIGNATURE_________________________________  NURSE SIGNATURE__________________________________  ________________________________________________________________________

## 2014-11-02 NOTE — Progress Notes (Signed)
10-26-14 STRESS TEST EPIC, HAS CARDIOLOGY FOLLOW UP APPPOINTMENT 11-04-2014 CHEST XRAY 10-20-14 EPIC ECHO 10-20-14 EPIC EKG 10-19-14 CBC WITH DIF ABNORMAL 10-20-14 EPIC CMET 10-20-14 EPIC

## 2014-11-02 NOTE — Assessment & Plan Note (Signed)
Chest pain: Atypical , stress test 10/26/2014 show a small area of ischemia but overall was low risk. Has an appointment to see cardiology this week. Colon cancer: in  need of surgery in the next few days, hopefully cardiology will be able to clear her. Fatigue: Ongoing issue, no clear etiology, but has a history of a sleep apnea (untreated, intolerant to CPAP) and  fibromyalgia with pain well controlled with Lyrica and fluoxetine.  Plan: TSH, vitamin D, M22, folic acid DM: Check Q3F

## 2014-11-03 ENCOUNTER — Encounter (HOSPITAL_COMMUNITY): Payer: Self-pay

## 2014-11-03 ENCOUNTER — Encounter (HOSPITAL_COMMUNITY)
Admission: RE | Admit: 2014-11-03 | Discharge: 2014-11-03 | Disposition: A | Discharge status: Home / Self Care | Payer: Medicare Other | Source: Ambulatory Visit | Attending: General Surgery | Admitting: General Surgery

## 2014-11-03 DIAGNOSIS — C182 Malignant neoplasm of ascending colon: Secondary | ICD-10-CM | POA: Diagnosis not present

## 2014-11-03 DIAGNOSIS — J8410 Pulmonary fibrosis, unspecified: Secondary | ICD-10-CM | POA: Diagnosis not present

## 2014-11-03 DIAGNOSIS — R0789 Other chest pain: Secondary | ICD-10-CM | POA: Diagnosis not present

## 2014-11-03 DIAGNOSIS — Z79899 Other long term (current) drug therapy: Secondary | ICD-10-CM | POA: Diagnosis not present

## 2014-11-03 DIAGNOSIS — Z01818 Encounter for other preprocedural examination: Secondary | ICD-10-CM | POA: Diagnosis not present

## 2014-11-03 DIAGNOSIS — Z0183 Encounter for blood typing: Secondary | ICD-10-CM | POA: Diagnosis not present

## 2014-11-03 DIAGNOSIS — I2510 Atherosclerotic heart disease of native coronary artery without angina pectoris: Secondary | ICD-10-CM | POA: Diagnosis not present

## 2014-11-03 DIAGNOSIS — Z01812 Encounter for preprocedural laboratory examination: Secondary | ICD-10-CM | POA: Diagnosis not present

## 2014-11-03 HISTORY — DX: Adverse effect of unspecified anesthetic, initial encounter: T41.45XA

## 2014-11-03 HISTORY — DX: Nausea with vomiting, unspecified: R11.2

## 2014-11-03 HISTORY — DX: Other specified postprocedural states: Z98.890

## 2014-11-03 HISTORY — DX: Unspecified rotator cuff tear or rupture of unspecified shoulder, not specified as traumatic: M75.100

## 2014-11-03 HISTORY — DX: Other complications of anesthesia, initial encounter: T88.59XA

## 2014-11-03 LAB — CBC
HEMATOCRIT: 38.2 % (ref 36.0–46.0)
HEMOGLOBIN: 12.3 g/dL (ref 12.0–15.0)
MCH: 27.8 pg (ref 26.0–34.0)
MCHC: 32.2 g/dL (ref 30.0–36.0)
MCV: 86.4 fL (ref 78.0–100.0)
PLATELETS: 155 10*3/uL (ref 150–400)
RBC: 4.42 MIL/uL (ref 3.87–5.11)
RDW: 14.1 % (ref 11.5–15.5)
WBC: 3 10*3/uL — AB (ref 4.0–10.5)

## 2014-11-03 LAB — ABO/RH: ABO/RH(D): O POS

## 2014-11-03 NOTE — Consult Note (Signed)
WOC ostomy consult note Patient seen for preoperative stoma site selection per Dr. [name]' request.  Patient understands the role of the [place] nurse.  We discussed today ostomy care and the possibility of a stoma and she is aware of this.  Last summer her husband had a temporary ostomy in our system; he has MS and diabetes and is a bilateral amputee.  Patient is accompanied on this visit by her sister. Abdomen is assessed in the sitting and standing positions and two sites are selected:  LLQ site is 5cm to the left of the umbilicus and 4.7WG below RUQ site is 6.5cm to the right of the umbilicus and 4cm above. There is a deep crease at the umbilicus.  If at all possible, this site should be avoided.  Both sites above are in flat planes.  Luetta Nutting are made with a surgical site marking pen and covered with a thin film transparent dressing.   [place] nursing team will not follow, but will remain available to this patient, the nursing, surgical and medical teams.  Please re-consult if an ostomy is created intraoperatively. Thanks, Maudie Flakes, MSN, RN, [place], [place], [place] [phone])

## 2014-11-03 NOTE — Progress Notes (Signed)
Cardiology Office Note   Date:  11/04/2014   ID:  Samantha [name], Samantha [name] 02/15/1940, MRN [id number]  PCP:  Kathlene November, MD  Cardiologist:  Dr. Ena Dawley   Electrophysiologist:  n/a  Chief Complaint  Patient presents with  . Hospitalization Follow-up    Admx with chest pain; s/p Nuc Stress Test  . Congestive Heart Failure     History of Present Illness: Samantha [name] is a 74 y.o. female with a hx of diastolic HF, DM2, HTN, HL, fibromyalgia, palpitations secondary to symptomatic PVCs, LE edema. Nuclear study in 2014 was normal. Echo in 2014 with normal LV function to moderate diastolic dysfunction. Holter monitoring 2014 demonstrated sinus rhythm with PVCs. Last seen by [name] 3/16.   Admitted 10/20/14 with chest pain, nausea and diaphoresis. Symptoms resolved with nitroglycerin and aspirin in the emergency room. Serial troponin levels were normal. Echocardiogram demonstrated LVH, diastolic dysfunction and normal ejection fraction. There were no wall motion abnormalities. Patient has a recent diagnosis of colon CA and is set to undergo robotic-assisted partial colectomy 11/11/14. Symptoms were felt to likely be from musculoskeletal etiology as they occurred after lifting her husband who has MS. However given the need for surgical clearance, she was set up for outpatient nuclear study. Myoview 10/26/14 demonstrated EF 70% with small inferolateral and apical defect consistent with ischemia. Study was felt to be low risk.  Returns for follow-up.  She continues to have substernal chest pain. Overall, it is improved. She describes it as an ache.  It comes on at rest and with exertion. However, exertion does not necessarily bring it on.  She notes assoc slight SOB, nausea and diaphoresis.  She denies syncope, orthopnea, PND, edema.     Studies/Reports Reviewed Today:   - Myoview 10/26/14: EF 70%, small area of inf-lat and apical ischemia, Low Risk  - Echo 10/20/14:  Focal basal and  mod LVH, EF 60-65%, no RWMA, Gr 1 DD  - Nuclear study 11/08: Normal.   - Echocardiogram 3/14: Mild LVH, EF 79-89%, grade 2 diastolic dysfunction.   - Carotid US 5/14: < 39% bilateral ICA stenosis.   - Holter monitor x 24 hours 07/2012: NSR with PVCs.   Carlton Adam Myoview 09/24/12 was normal.   Past Medical History  Diagnosis Date  . Hyperlipidemia   . Hypertension   . Chest pain, atypical 11/2006    saw cards: neg/normal Holter, ECHo and myoview, admitted w/ CP 4/09 neg enz.and CT chest had a 9 beat NSVT  . Fibromyalgia   . Abnormal CT of the chest 2008    last CT4-l 2009:  . No f/u suggested   . 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 Shingles 11/2009  . Vaginal dysplasia   . Type II diabetes mellitus ([place])   . Heart murmur   . Asthma   . H/O hiatal hernia   . GERD (gastroesophageal reflux disease)   . Ocular migraine   . TIA (transient ischemic attack) 06/27/2012    "this is my first" (06/27/2012) and one 2 months later  . Rheumatoid factor positive   . Osteoarthritis   . Reactive airway disease 01/29/2002    dx of pseudoasthma / vcd in 2005 and nl sprirometry History of dyspnea, 2011,  improved after several medications were changed around Question of COPD, disproved July 06, 2009 with nl pft's      . TIA (transient ischemic attack)     x2  .  OSA (obstructive sleep apnea) 09/2007    dx w/ a sleep study, not on  CPAP  . Pneumonia     "double" in 2004  . 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 Complication of anesthesia     trouble waking up  . PONV (postoperative nausea and vomiting)   . Torn rotator cuff     right worse than left, both are torn  . CHF (congestive heart failure) ([place])     ischemia     Past Surgical History  Procedure Laterality Date  . Thyroidectomy, partial  1960's  . Bunionectomy Left ~ 1977  . Breast biopsy Right 1999  . Anterior  cervical decomp/discectomy fusion  2001    C 3, C4 and C5 plate and screws  . Cataract extraction w/ intraocular lens  implant, bilateral  2012  . Vaginal mass excision  1994    "Laser surgery for vaginal cancer; followed by chemotherapy" (06/27/2012)  . Abdominal hysterectomy  1980    NO oophorectomy per pt   . Eye surgery Bilateral     torq lens for cataracts     Current Outpatient Prescriptions  Medication Sig Dispense Refill  . acetaminophen (TYLENOL) 500 MG tablet Take 1,000 mg by mouth every 6 (six) hours as needed for moderate pain.    [name] albuterol (PROVENTIL HFA;VENTOLIN HFA) 108 (90 BASE) MCG/ACT inhaler Inhale 2 puffs into the lungs every 6 (six) hours as needed for wheezing or shortness of breath.    [name] atorvastatin (LIPITOR) 10 MG tablet Take 1 tablet (10 mg total) by mouth daily. (Patient taking differently: Take 10 mg by mouth daily at 6 PM. ) 90 tablet 1  . butalbital-acetaminophen-caffeine (FIORICET, ESGIC) 50-325-40 MG per tablet As needed for migraine (Patient taking differently: Take 1 tablet by mouth every 6 (six) hours as needed for headache or migraine. As needed for migraine) 15 tablet 3  . CALCIUM PO Take 1 tablet by mouth every morning.     . cetirizine (ZYRTEC) 10 MG tablet Take 1 tablet (10 mg total) by mouth daily. 90 tablet 1  . cholecalciferol (VITAMIN D) 1000 UNITS tablet Take 1,000 Units by mouth every morning.     . clopidogrel (PLAVIX) 75 MG tablet Take 1 tablet (75 mg total) by mouth daily with breakfast. 90 tablet 3  . DULoxetine (CYMBALTA) 60 MG capsule TAKE 1 CAPSULE (60 MG TOTAL) BY MOUTH DAILY. 30 capsule 11  . furosemide (LASIX) 20 MG tablet Take 1 tablet (20 mg total) by mouth 2 (two) times daily. 30 tablet 6  . hydrALAZINE (APRESOLINE) 25 MG tablet Take 1 tablet (25 mg total) by mouth 3 (three) times daily. 270 tablet 3  . lisinopril (PRINIVIL,ZESTRIL) 40 MG tablet Take 1 tablet (40 mg total) by mouth daily. 90 tablet 3  . metFORMIN (GLUCOPHAGE-XR)  500 MG 24 hr tablet Take 1 tablet (500 mg total) by mouth daily with breakfast. 90 tablet 1  . Misc Natural Products (OSTEO BI-FLEX JOINT SHIELD) TABS Take 1 tablet by mouth daily.    . Multiple Vitamin (MULTIVITAMIN) capsule Take 1 capsule by mouth daily.      . nebivolol (BYSTOLIC) 10 MG tablet TAKE 2 TABLETS BY MOUTH IN THE MORNING AND 1 TABLET IN THE EVENING SPACE 12 HOURS APART 90 tablet 6  . ondansetron (ZOFRAN ODT) 4 MG disintegrating tablet Take 1 tablet (4 mg total) by  mouth every 8 (eight) hours as needed for nausea or vomiting. 12 tablet 1  . Polyethyl Glycol-Propyl Glycol (SYSTANE) 0.4-0.3 % GEL Apply 1 drop to eye daily as needed (dry eyes).    . Potassium 99 MG TABS Take 99 mg by mouth daily.     . pregabalin (LYRICA) 100 MG capsule Take 1 capsule (100 mg total) by mouth at bedtime as needed. (Patient taking differently: Take 100 mg by mouth at bedtime as needed (nerve pain, sleep). ) 30 capsule 6  . ranitidine (ZANTAC) 300 MG tablet Take 1 tablet (300 mg total) by mouth at bedtime. 90 tablet 1   No current facility-administered medications for this visit.    Allergies:   Cefuroxime axetil; Oxycodone; Seldane; Zocor; Tramadol; Bactrim; and Pravastatin    Social History:  The patient  reports that she quit smoking about 16 years ago. Her smoking use included Cigarettes. She has a 1.25 pack-year smoking history. She has never used smokeless tobacco. She reports that she does not drink alcohol or use illicit drugs.   Family History:  The patient's family history includes Allergies in her sister; Aneurysm in her brother; Asthma in her paternal grandmother and sister; Diabetes in her brother and brother; Emphysema in her brother; Heart disease in her brother, father, mother, and another family member; Kidney failure in her brother; Lung cancer in her mother; Parkinsonism in her sister; Stroke in her brother, maternal grandmother, and paternal grandmother. There is no history of Breast  cancer, Colon cancer, or Heart attack.    ROS:   Please see the history of present illness.   Review of Systems  Constitution: Positive for malaise/fatigue.  HENT: Positive for headaches.   Cardiovascular: Positive for chest pain and irregular heartbeat.  Respiratory: Positive for cough, shortness of breath and wheezing.   Hematologic/Lymphatic: Bruises/bleeds easily.  Musculoskeletal: Positive for joint pain and myalgias.  Gastrointestinal: Positive for abdominal pain, diarrhea, hematochezia and nausea.  Neurological: Positive for dizziness and loss of balance.  All other systems reviewed and are negative.     PHYSICAL EXAM: VS:  BP 130/70 mmHg  Pulse 61  Ht 5\' 2"  (1.575 m)  Wt 197 lb (89.359 kg)  BMI 36.02 kg/m2    Wt Readings from Last 3 Encounters:  11/04/14 197 lb (89.359 kg)  11/03/14 196 lb 6.4 oz (89.086 kg)  11/02/14 195 lb 8 oz (88.678 kg)     GEN: Well nourished, well developed, in no acute distress HEENT: normal Neck: no JVD,  no masses Cardiac:  Normal S1/S2, RRR; no murmur ,  no rubs or gallops, no edema   Respiratory:  clear to auscultation bilaterally, no wheezing, rhonchi or rales. GI: soft, nontender, nondistended, + BS MS: no deformity or atrophy Skin: warm and dry  Neuro:  CNs II-XII intact, Strength and sensation are intact Psych: Normal affect   EKG:  EKG is ordered today.  It demonstrates:   NSR, HR 61, normal axis, first degree AV block, QTC 432, no change from prior tracing   Recent Labs: 10/19/2014: B Natriuretic Peptide 54.1 10/20/2014: ALT 17; BUN 14; Creatinine, Ser 0.73; Potassium 3.6; Sodium 141 11/02/2014: TSH 2.06 11/03/2014: Hemoglobin 12.3; Platelets 155    Lipid Panel    Component Value Date/Time   CHOL 171 05/19/2014 1059   TRIG 112.0 05/19/2014 1059   HDL 65.50 05/19/2014 1059   CHOLHDL 3 05/19/2014 1059   VLDL 22.4 05/19/2014 1059   LDLCALC 83 05/19/2014 1059   LDLDIRECT 126.2 12/07/2010 1039  ASSESSMENT AND  PLAN:  1. Chest Pain: Status post recent admission to the hospital with chest discomfort. Symptoms were felt to be atypical for ischemia and troponin levels remained normal. The patient underwent outpatient stress test that was felt to be low risk but did demonstrate small area of inferolateral and apical ischemia. She needs upcoming colon cancer surgery. Of note, she had staging CT in 8/16 which did demonstrate coronary calcifications. She continues to have chest symptoms with typical and atypical features.  Overall, her chest pain is improved.  I reviewed her case today with [name].  Given her ongoing symptoms, mildly abnormal nuclear stress test and the need to risk stratify her for upcoming colon CA surgery, will arrange Cardiac CTA with Dr. Ena Dawley tomorrow.   2. Chronic Diastolic CHF:  Volume appears stable.  Continue current Rx.   3. HTN:  Controlled.   4. Hyperlipidemia:  Continue statin.    5. Diabetes:  Controlled. A1c 10/16 was 6.5.  FU with PCP.   6. Colon CA:  Patient needs clearance for surgery from a cardiac perspective.  As noted, case reviewed with Dr. Ena Dawley.  Cardiac CTA will be performed tomorrow.  Further recommendations regarding surgical risk to follow.       Medication Changes: Current medicines are reviewed at length with the patient today.  Concerns regarding medicines are as outlined above.  The following changes have been made:   Discontinued Medications   FLUTICASONE (FLONASE) 50 MCG/ACT NASAL SPRAY    Place 2 sprays into the nose daily as needed for allergies or rhinitis.    Modified Medications   Modified Medication Previous Medication   HYDRALAZINE (APRESOLINE) 25 MG TABLET hydrALAZINE (APRESOLINE) 25 MG tablet      Take 1 tablet (25 mg total) by mouth 3 (three) times daily.    Take 1 tablet (25 mg total) by mouth 3 (three) times daily.   LISINOPRIL (PRINIVIL,ZESTRIL) 40 MG TABLET lisinopril (PRINIVIL,ZESTRIL) 40 MG tablet      Take 1  tablet (40 mg total) by mouth daily.    Take 1 tablet (40 mg total) by mouth daily.   NEBIVOLOL (BYSTOLIC) 10 MG TABLET BYSTOLIC 10 MG tablet      TAKE 2 TABLETS BY MOUTH IN THE MORNING AND 1 TABLET IN THE EVENING SPACE 12 HOURS APART    TAKE 2 TABLETS BY MOUTH IN THE MORNING AND 1 TABLET IN THE EVENING SPACE 12 HOURS APART   New Prescriptions   No medications on file   Labs/ tests ordered today include:   Orders Placed This Encounter  Procedures  . CT Heart Morp W/Cta Cor W/Score W/Ca W/Cm &/Or Wo/Cm  . EKG 12-Lead     Disposition:    FU with Dr. Ena Dawley 1 month.    Signed, Versie Starks, MHS 11/04/2014 9:50 AM    [facility] [address], [place], [place]  [address] Phone: [phone]; Fax: [phone]

## 2014-11-04 ENCOUNTER — Encounter: Payer: Self-pay | Admitting: Physician Assistant

## 2014-11-04 ENCOUNTER — Encounter: Payer: Self-pay | Admitting: Cardiology

## 2014-11-04 ENCOUNTER — Ambulatory Visit (INDEPENDENT_AMBULATORY_CARE_PROVIDER_SITE_OTHER): Payer: Medicare Other | Admitting: Physician Assistant

## 2014-11-04 VITALS — BP 130/70 | HR 61 | Ht 62.0 in | Wt 197.0 lb

## 2014-11-04 DIAGNOSIS — E1140 Type 2 diabetes mellitus with diabetic neuropathy, unspecified: Secondary | ICD-10-CM

## 2014-11-04 DIAGNOSIS — I10 Essential (primary) hypertension: Secondary | ICD-10-CM | POA: Diagnosis not present

## 2014-11-04 DIAGNOSIS — I5032 Chronic diastolic (congestive) heart failure: Secondary | ICD-10-CM | POA: Diagnosis not present

## 2014-11-04 DIAGNOSIS — R0789 Other chest pain: Secondary | ICD-10-CM

## 2014-11-04 DIAGNOSIS — Z01810 Encounter for preprocedural cardiovascular examination: Secondary | ICD-10-CM

## 2014-11-04 DIAGNOSIS — E785 Hyperlipidemia, unspecified: Secondary | ICD-10-CM

## 2014-11-04 LAB — HEMOGLOBIN A1C
HEMOGLOBIN A1C: 6.5 % — AB (ref 4.8–5.6)
Mean Plasma Glucose: 140 mg/dL

## 2014-11-04 MED ORDER — LISINOPRIL 40 MG PO TABS: 40.0000 mg | ORAL_TABLET | Freq: Every day | ORAL | Status: DC

## 2014-11-04 MED ORDER — NEBIVOLOL HCL 10 MG PO TABS: ORAL_TABLET | ORAL | Status: DC

## 2014-11-04 MED ORDER — HYDRALAZINE HCL 25 MG PO TABS: 25.0000 mg | ORAL_TABLET | Freq: Three times a day (TID) | ORAL | Status: DC

## 2014-11-04 NOTE — Patient Instructions (Signed)
Medication Instructions:  REFILLS SENT IN FOR THE HYDRALAZINE, LISINOPRIL, BYSTOLIC  Labwork: NONE  Testing/Procedures: CARDIAC CT-A AT [place] 10/7 TO BE READ BY [name]  Follow-Up: DR./ NELSON IN 1 MONTH  Any Other Special Instructions Will Be Listed Below (If Applicable).

## 2014-11-05 ENCOUNTER — Encounter (HOSPITAL_COMMUNITY): Payer: Self-pay

## 2014-11-05 ENCOUNTER — Ambulatory Visit (HOSPITAL_COMMUNITY)
Admission: RE | Admit: 2014-11-05 | Discharge: 2014-11-05 | Disposition: A | Discharge status: Home / Self Care | Payer: Medicare Other | Source: Ambulatory Visit | Attending: Physician Assistant | Admitting: Physician Assistant

## 2014-11-05 DIAGNOSIS — J8410 Pulmonary fibrosis, unspecified: Secondary | ICD-10-CM | POA: Diagnosis not present

## 2014-11-05 DIAGNOSIS — Z01812 Encounter for preprocedural laboratory examination: Secondary | ICD-10-CM | POA: Insufficient documentation

## 2014-11-05 DIAGNOSIS — C182 Malignant neoplasm of ascending colon: Secondary | ICD-10-CM | POA: Insufficient documentation

## 2014-11-05 DIAGNOSIS — Z79899 Other long term (current) drug therapy: Secondary | ICD-10-CM | POA: Insufficient documentation

## 2014-11-05 DIAGNOSIS — R0789 Other chest pain: Secondary | ICD-10-CM

## 2014-11-05 DIAGNOSIS — Z01818 Encounter for other preprocedural examination: Secondary | ICD-10-CM | POA: Diagnosis not present

## 2014-11-05 DIAGNOSIS — Z0183 Encounter for blood typing: Secondary | ICD-10-CM | POA: Diagnosis not present

## 2014-11-05 DIAGNOSIS — I5032 Chronic diastolic (congestive) heart failure: Secondary | ICD-10-CM

## 2014-11-05 DIAGNOSIS — I2510 Atherosclerotic heart disease of native coronary artery without angina pectoris: Secondary | ICD-10-CM | POA: Insufficient documentation

## 2014-11-05 HISTORY — DX: Systemic involvement of connective tissue, unspecified: M35.9

## 2014-11-05 MED ORDER — NITROGLYCERIN 0.4 MG SL SUBL
0.8000 mg | SUBLINGUAL_TABLET | Freq: Once | SUBLINGUAL | Status: AC
  Administered 2014-11-05: 0.8 mg via SUBLINGUAL
  Filled 2014-11-05: qty 25

## 2014-11-05 MED ORDER — METOPROLOL TARTRATE 1 MG/ML IV SOLN
INTRAVENOUS | Status: DC
Start: 2014-11-05 — End: 2014-11-05
  Filled 2014-11-05: qty 5

## 2014-11-05 MED ORDER — IOHEXOL 350 MG/ML SOLN
80.0000 mL | Freq: Once | INTRAVENOUS | Status: AC | PRN
  Administered 2014-11-05: 80 mL via INTRAVENOUS

## 2014-11-05 MED ORDER — NITROGLYCERIN 0.4 MG SL SUBL
SUBLINGUAL_TABLET | SUBLINGUAL | Status: AC
  Administered 2014-11-05: 0.8 mg via SUBLINGUAL
  Filled 2014-11-05: qty 2

## 2014-11-05 MED ORDER — METOPROLOL TARTRATE 1 MG/ML IV SOLN
2.5000 mg | INTRAVENOUS | Status: DC | PRN
  Filled 2014-11-05: qty 5

## 2014-11-08 ENCOUNTER — Encounter: Payer: Self-pay | Admitting: Physician Assistant

## 2014-11-08 ENCOUNTER — Telehealth: Payer: Self-pay | Admitting: Cardiology

## 2014-11-08 NOTE — Telephone Encounter (Signed)
New problem   Pt  Has CT on 10.7.16 and want to know results. Please call pt.

## 2014-11-08 NOTE — Telephone Encounter (Signed)
DISCUSSED  WITH  DR Meda Coffee   CT  SHOWS  SCATTERED  DISEASE BUT NON  OBSTRUCTIVE MAY  PROCEED WITH  SURGERY NO  CATH  NEEDED PT  AWARE .Adonis Housekeeper

## 2014-11-10 ENCOUNTER — Telehealth: Payer: Self-pay | Admitting: *Deleted

## 2014-11-10 MED ORDER — GENTAMICIN SULFATE 40 MG/ML IJ SOLN
INTRAVENOUS | Status: AC
  Administered 2014-11-11: 08:00:00 via INTRAVENOUS
  Filled 2014-11-10: qty 8

## 2014-11-10 NOTE — Telephone Encounter (Signed)
Pt notified of CT results no obstructive CAD. Pt aware ok to have her surgery. Pt states she called the other day and was told already of CT results and was cleared surgery. Pt wants to thank Brynda Rim. PA and [name] for all they have done for her.

## 2014-11-11 ENCOUNTER — Inpatient Hospital Stay (HOSPITAL_COMMUNITY): Payer: Medicare Other | Admitting: Anesthesiology

## 2014-11-11 ENCOUNTER — Inpatient Hospital Stay (HOSPITAL_COMMUNITY)
Admission: RE | Admit: 2014-11-11 | Discharge: 2014-11-16 | DRG: 331 | Disposition: A | Discharge status: Home / Self Care | Payer: Medicare Other | Source: Ambulatory Visit | Attending: Surgery | Admitting: Surgery

## 2014-11-11 ENCOUNTER — Encounter (HOSPITAL_COMMUNITY): Payer: Self-pay | Admitting: *Deleted

## 2014-11-11 ENCOUNTER — Encounter (HOSPITAL_COMMUNITY)
Admission: RE | Disposition: A | Discharge status: Home / Self Care | Payer: Self-pay | Source: Ambulatory Visit | Attending: General Surgery

## 2014-11-11 DIAGNOSIS — E119 Type 2 diabetes mellitus without complications: Secondary | ICD-10-CM | POA: Diagnosis not present

## 2014-11-11 DIAGNOSIS — K449 Diaphragmatic hernia without obstruction or gangrene: Secondary | ICD-10-CM | POA: Diagnosis present

## 2014-11-11 DIAGNOSIS — Z8249 Family history of ischemic heart disease and other diseases of the circulatory system: Secondary | ICD-10-CM | POA: Diagnosis not present

## 2014-11-11 DIAGNOSIS — I2510 Atherosclerotic heart disease of native coronary artery without angina pectoris: Secondary | ICD-10-CM | POA: Diagnosis present

## 2014-11-11 DIAGNOSIS — E669 Obesity, unspecified: Secondary | ICD-10-CM | POA: Diagnosis not present

## 2014-11-11 DIAGNOSIS — C19 Malignant neoplasm of rectosigmoid junction: Secondary | ICD-10-CM | POA: Diagnosis not present

## 2014-11-11 DIAGNOSIS — I10 Essential (primary) hypertension: Secondary | ICD-10-CM | POA: Diagnosis not present

## 2014-11-11 DIAGNOSIS — I739 Peripheral vascular disease, unspecified: Secondary | ICD-10-CM | POA: Diagnosis not present

## 2014-11-11 DIAGNOSIS — C182 Malignant neoplasm of ascending colon: Secondary | ICD-10-CM | POA: Diagnosis not present

## 2014-11-11 DIAGNOSIS — Z87891 Personal history of nicotine dependence: Secondary | ICD-10-CM

## 2014-11-11 DIAGNOSIS — M797 Fibromyalgia: Secondary | ICD-10-CM | POA: Diagnosis present

## 2014-11-11 DIAGNOSIS — K219 Gastro-esophageal reflux disease without esophagitis: Secondary | ICD-10-CM | POA: Diagnosis present

## 2014-11-11 DIAGNOSIS — J45909 Unspecified asthma, uncomplicated: Secondary | ICD-10-CM | POA: Diagnosis present

## 2014-11-11 DIAGNOSIS — Z8673 Personal history of transient ischemic attack (TIA), and cerebral infarction without residual deficits: Secondary | ICD-10-CM

## 2014-11-11 DIAGNOSIS — Z85038 Personal history of other malignant neoplasm of large intestine: Secondary | ICD-10-CM | POA: Diagnosis present

## 2014-11-11 DIAGNOSIS — Z800 Family history of malignant neoplasm of digestive organs: Secondary | ICD-10-CM | POA: Diagnosis not present

## 2014-11-11 DIAGNOSIS — Z90710 Acquired absence of both cervix and uterus: Secondary | ICD-10-CM | POA: Diagnosis not present

## 2014-11-11 DIAGNOSIS — M1990 Unspecified osteoarthritis, unspecified site: Secondary | ICD-10-CM | POA: Diagnosis present

## 2014-11-11 DIAGNOSIS — D175 Benign lipomatous neoplasm of intra-abdominal organs: Secondary | ICD-10-CM | POA: Diagnosis not present

## 2014-11-11 LAB — GLUCOSE, CAPILLARY
GLUCOSE-CAPILLARY: 171 mg/dL — AB (ref 65–99)
Glucose-Capillary: 106 mg/dL — ABNORMAL HIGH (ref 65–99)
Glucose-Capillary: 171 mg/dL — ABNORMAL HIGH (ref 65–99)
Glucose-Capillary: 190 mg/dL — ABNORMAL HIGH (ref 65–99)

## 2014-11-11 LAB — TYPE AND SCREEN
ABO/RH(D): O POS
Antibody Screen: NEGATIVE

## 2014-11-11 SURGERY — COLECTOMY, PARTIAL, ROBOT-ASSISTED, LAPAROSCOPIC
Anesthesia: General

## 2014-11-11 MED ORDER — ENOXAPARIN SODIUM 40 MG/0.4ML ~~LOC~~ SOLN
40.0000 mg | SUBCUTANEOUS | Status: DC
  Administered 2014-11-12 – 2014-11-16 (×5): 40 mg via SUBCUTANEOUS
  Filled 2014-11-11 (×7): qty 0.4

## 2014-11-11 MED ORDER — PHENYLEPHRINE HCL 10 MG/ML IJ SOLN
INTRAMUSCULAR | Status: DC | PRN
  Administered 2014-11-11 (×2): 80 ug via INTRAVENOUS

## 2014-11-11 MED ORDER — ONDANSETRON HCL 4 MG PO TABS
4.0000 mg | ORAL_TABLET | Freq: Four times a day (QID) | ORAL | Status: DC | PRN
  Administered 2014-11-12 – 2014-11-13 (×2): 4 mg via ORAL
  Filled 2014-11-11 (×2): qty 1

## 2014-11-11 MED ORDER — LACTATED RINGERS IV SOLN
INTRAVENOUS | Status: DC | PRN
  Administered 2014-11-11: 07:00:00 via INTRAVENOUS

## 2014-11-11 MED ORDER — ONDANSETRON HCL 4 MG/2ML IJ SOLN
INTRAMUSCULAR | Status: DC | PRN
  Administered 2014-11-11: 4 mg via INTRAVENOUS

## 2014-11-11 MED ORDER — DIPHENHYDRAMINE HCL 12.5 MG/5ML PO ELIX: 12.5000 mg | ORAL_SOLUTION | Freq: Four times a day (QID) | ORAL | Status: DC | PRN

## 2014-11-11 MED ORDER — ALBUTEROL SULFATE (2.5 MG/3ML) 0.083% IN NEBU: 2.5000 mg | INHALATION_SOLUTION | Freq: Four times a day (QID) | RESPIRATORY_TRACT | Status: DC | PRN

## 2014-11-11 MED ORDER — LISINOPRIL 40 MG PO TABS
40.0000 mg | ORAL_TABLET | Freq: Every day | ORAL | Status: DC
  Administered 2014-11-11 – 2014-11-16 (×6): 40 mg via ORAL
  Filled 2014-11-11 (×7): qty 1

## 2014-11-11 MED ORDER — MORPHINE SULFATE (PF) 2 MG/ML IV SOLN
2.0000 mg | INTRAVENOUS | Status: DC | PRN
  Administered 2014-11-11: 4 mg via INTRAVENOUS
  Administered 2014-11-11 – 2014-11-12 (×2): 2 mg via INTRAVENOUS
  Administered 2014-11-12: 4 mg via INTRAVENOUS
  Administered 2014-11-12: 2 mg via INTRAVENOUS
  Administered 2014-11-12: 4 mg via INTRAVENOUS
  Administered 2014-11-13 (×3): 2 mg via INTRAVENOUS
  Filled 2014-11-11 (×4): qty 1
  Filled 2014-11-11 (×2): qty 2
  Filled 2014-11-11: qty 1
  Filled 2014-11-11: qty 2
  Filled 2014-11-11: qty 1

## 2014-11-11 MED ORDER — ONDANSETRON HCL 4 MG/2ML IJ SOLN
INTRAMUSCULAR | Status: AC
Start: 2014-11-11 — End: 2014-11-11
  Filled 2014-11-11: qty 2

## 2014-11-11 MED ORDER — LIDOCAINE HCL (CARDIAC) 20 MG/ML IV SOLN
INTRAVENOUS | Status: AC
  Filled 2014-11-11: qty 5

## 2014-11-11 MED ORDER — SUCCINYLCHOLINE CHLORIDE 20 MG/ML IJ SOLN
INTRAMUSCULAR | Status: DC | PRN
  Administered 2014-11-11: 100 mg via INTRAVENOUS

## 2014-11-11 MED ORDER — ALVIMOPAN 12 MG PO CAPS
12.0000 mg | ORAL_CAPSULE | Freq: Two times a day (BID) | ORAL | Status: DC
  Administered 2014-11-12 – 2014-11-14 (×5): 12 mg via ORAL
  Filled 2014-11-11 (×6): qty 1

## 2014-11-11 MED ORDER — HYDROMORPHONE HCL 1 MG/ML IJ SOLN
INTRAMUSCULAR | Status: AC
  Filled 2014-11-11: qty 1

## 2014-11-11 MED ORDER — SUGAMMADEX SODIUM 500 MG/5ML IV SOLN
INTRAVENOUS | Status: AC
  Filled 2014-11-11: qty 5

## 2014-11-11 MED ORDER — POTASSIUM GLUCONATE 595 (99 K) MG PO TABS
595.0000 mg | ORAL_TABLET | Freq: Every day | ORAL | Status: DC
  Administered 2014-11-11 – 2014-11-16 (×6): 595 mg via ORAL
  Filled 2014-11-11 (×6): qty 1

## 2014-11-11 MED ORDER — ONDANSETRON HCL 4 MG/2ML IJ SOLN
4.0000 mg | Freq: Four times a day (QID) | INTRAMUSCULAR | Status: DC | PRN
  Administered 2014-11-11 – 2014-11-13 (×3): 4 mg via INTRAVENOUS
  Filled 2014-11-11 (×3): qty 2

## 2014-11-11 MED ORDER — HYDROMORPHONE HCL 1 MG/ML IJ SOLN
INTRAMUSCULAR | Status: AC
  Administered 2014-11-11: 0.5 mg via INTRAVENOUS
  Filled 2014-11-11: qty 1

## 2014-11-11 MED ORDER — KCL IN DEXTROSE-NACL 20-5-0.45 MEQ/L-%-% IV SOLN
INTRAVENOUS | Status: DC
  Administered 2014-11-11 – 2014-11-13 (×2): via INTRAVENOUS
  Filled 2014-11-11 (×7): qty 1000

## 2014-11-11 MED ORDER — NEBIVOLOL HCL 10 MG PO TABS
10.0000 mg | ORAL_TABLET | Freq: Every day | ORAL | Status: DC
  Administered 2014-11-11: 10 mg via ORAL
  Filled 2014-11-11: qty 1

## 2014-11-11 MED ORDER — PHENYLEPHRINE 40 MCG/ML (10ML) SYRINGE FOR IV PUSH (FOR BLOOD PRESSURE SUPPORT)
PREFILLED_SYRINGE | INTRAVENOUS | Status: AC
  Filled 2014-11-11: qty 10

## 2014-11-11 MED ORDER — CLOPIDOGREL BISULFATE 75 MG PO TABS
75.0000 mg | ORAL_TABLET | Freq: Every day | ORAL | Status: DC
  Administered 2014-11-14 – 2014-11-16 (×3): 75 mg via ORAL
  Filled 2014-11-11 (×4): qty 1

## 2014-11-11 MED ORDER — PROPOFOL 10 MG/ML IV BOLUS
INTRAVENOUS | Status: DC | PRN
  Administered 2014-11-11: 150 mg via INTRAVENOUS

## 2014-11-11 MED ORDER — SUGAMMADEX SODIUM 200 MG/2ML IV SOLN
INTRAVENOUS | Status: DC | PRN
  Administered 2014-11-11: 180 mg via INTRAVENOUS

## 2014-11-11 MED ORDER — DIPHENHYDRAMINE HCL 50 MG/ML IJ SOLN: 12.5000 mg | Freq: Four times a day (QID) | INTRAMUSCULAR | Status: DC | PRN

## 2014-11-11 MED ORDER — POLYETHYL GLYCOL-PROPYL GLYCOL 0.4-0.3 % OP GEL: Freq: Every day | OPHTHALMIC | Status: DC | PRN

## 2014-11-11 MED ORDER — ALVIMOPAN 12 MG PO CAPS
12.0000 mg | ORAL_CAPSULE | Freq: Once | ORAL | Status: AC
  Administered 2014-11-11: 12 mg via ORAL
  Filled 2014-11-11: qty 1

## 2014-11-11 MED ORDER — INSULIN ASPART 100 UNIT/ML ~~LOC~~ SOLN: 0.0000 [IU] | Freq: Every day | SUBCUTANEOUS | Status: DC

## 2014-11-11 MED ORDER — FUROSEMIDE 20 MG PO TABS
20.0000 mg | ORAL_TABLET | Freq: Two times a day (BID) | ORAL | Status: DC
  Administered 2014-11-11 – 2014-11-16 (×8): 20 mg via ORAL
  Filled 2014-11-11 (×13): qty 1

## 2014-11-11 MED ORDER — EPHEDRINE SULFATE 50 MG/ML IJ SOLN
INTRAMUSCULAR | Status: DC | PRN
  Administered 2014-11-11: 10 mg via INTRAVENOUS

## 2014-11-11 MED ORDER — BUPIVACAINE-EPINEPHRINE 0.25% -1:200000 IJ SOLN
INTRAMUSCULAR | Status: DC | PRN
  Administered 2014-11-11: 20 mL

## 2014-11-11 MED ORDER — ROCURONIUM BROMIDE 100 MG/10ML IV SOLN
INTRAVENOUS | Status: DC | PRN
  Administered 2014-11-11 (×2): 20 mg via INTRAVENOUS
  Administered 2014-11-11: 30 mg via INTRAVENOUS

## 2014-11-11 MED ORDER — METFORMIN HCL ER 500 MG PO TB24
500.0000 mg | ORAL_TABLET | Freq: Every day | ORAL | Status: DC
  Administered 2014-11-13 – 2014-11-16 (×4): 500 mg via ORAL
  Filled 2014-11-11 (×5): qty 1

## 2014-11-11 MED ORDER — STERILE WATER FOR INJECTION IJ SOLN
INTRAMUSCULAR | Status: AC
  Filled 2014-11-11: qty 20

## 2014-11-11 MED ORDER — PROPOFOL 10 MG/ML IV BOLUS
INTRAVENOUS | Status: AC
  Filled 2014-11-11: qty 20

## 2014-11-11 MED ORDER — SUFENTANIL CITRATE 50 MCG/ML IV SOLN
INTRAVENOUS | Status: AC
  Filled 2014-11-11: qty 1

## 2014-11-11 MED ORDER — ONDANSETRON HCL 4 MG/2ML IJ SOLN
INTRAMUSCULAR | Status: AC
  Filled 2014-11-11: qty 2

## 2014-11-11 MED ORDER — CLINDAMYCIN PHOSPHATE 900 MG/50ML IV SOLN
INTRAVENOUS | Status: AC
Start: 2014-11-11 — End: 2014-11-11
  Filled 2014-11-11: qty 50

## 2014-11-11 MED ORDER — NEBIVOLOL HCL 10 MG PO TABS
10.0000 mg | ORAL_TABLET | Freq: Every day | ORAL | Status: DC
  Administered 2014-11-11 – 2014-11-16 (×6): 10 mg via ORAL
  Filled 2014-11-11 (×7): qty 1

## 2014-11-11 MED ORDER — HYDROMORPHONE HCL 2 MG/ML IJ SOLN
INTRAMUSCULAR | Status: AC
  Filled 2014-11-11: qty 1

## 2014-11-11 MED ORDER — DULOXETINE HCL 60 MG PO CPEP
60.0000 mg | ORAL_CAPSULE | Freq: Every day | ORAL | Status: DC
  Administered 2014-11-11 – 2014-11-16 (×6): 60 mg via ORAL
  Filled 2014-11-11 (×7): qty 1

## 2014-11-11 MED ORDER — POLYVINYL ALCOHOL 1.4 % OP SOLN
1.0000 [drp] | OPHTHALMIC | Status: DC | PRN
  Filled 2014-11-11: qty 15

## 2014-11-11 MED ORDER — PROMETHAZINE HCL 25 MG/ML IJ SOLN: 6.2500 mg | INTRAMUSCULAR | Status: DC | PRN

## 2014-11-11 MED ORDER — 0.9 % SODIUM CHLORIDE (POUR BTL) OPTIME
TOPICAL | Status: DC | PRN
  Administered 2014-11-11: 2000 mL

## 2014-11-11 MED ORDER — DEXAMETHASONE SODIUM PHOSPHATE 10 MG/ML IJ SOLN
INTRAMUSCULAR | Status: DC | PRN
  Administered 2014-11-11: 10 mg via INTRAVENOUS

## 2014-11-11 MED ORDER — HYDRALAZINE HCL 25 MG PO TABS
25.0000 mg | ORAL_TABLET | Freq: Three times a day (TID) | ORAL | Status: DC
  Administered 2014-11-11 – 2014-11-15 (×13): 25 mg via ORAL
  Filled 2014-11-11 (×19): qty 1

## 2014-11-11 MED ORDER — CLINDAMYCIN PHOSPHATE 900 MG/50ML IV SOLN
900.0000 mg | Freq: Three times a day (TID) | INTRAVENOUS | Status: AC
  Administered 2014-11-11: 900 mg via INTRAVENOUS
  Filled 2014-11-11: qty 50

## 2014-11-11 MED ORDER — SUFENTANIL CITRATE 50 MCG/ML IV SOLN
INTRAVENOUS | Status: DC | PRN
  Administered 2014-11-11: 5 ug via INTRAVENOUS
  Administered 2014-11-11: 10 ug via INTRAVENOUS
  Administered 2014-11-11 (×4): 5 ug via INTRAVENOUS

## 2014-11-11 MED ORDER — LACTATED RINGERS IR SOLN
Status: DC | PRN
  Administered 2014-11-11: 1

## 2014-11-11 MED ORDER — LIDOCAINE HCL (CARDIAC) 20 MG/ML IV SOLN
INTRAVENOUS | Status: DC | PRN
  Administered 2014-11-11: 100 mg via INTRAVENOUS

## 2014-11-11 MED ORDER — INSULIN ASPART 100 UNIT/ML ~~LOC~~ SOLN
0.0000 [IU] | Freq: Three times a day (TID) | SUBCUTANEOUS | Status: DC
  Administered 2014-11-11: 4 [IU] via SUBCUTANEOUS
  Administered 2014-11-12 – 2014-11-13 (×2): 3 [IU] via SUBCUTANEOUS

## 2014-11-11 MED ORDER — HYDROMORPHONE HCL 1 MG/ML IJ SOLN
INTRAMUSCULAR | Status: DC | PRN
  Administered 2014-11-11: .4 mg via INTRAVENOUS
  Administered 2014-11-11 (×2): .2 mg via INTRAVENOUS
  Administered 2014-11-11: .4 mg via INTRAVENOUS

## 2014-11-11 MED ORDER — HYDROMORPHONE HCL 1 MG/ML IJ SOLN
0.2500 mg | INTRAMUSCULAR | Status: DC | PRN
  Administered 2014-11-11: 0.5 mg via INTRAVENOUS
  Administered 2014-11-11 (×4): 0.25 mg via INTRAVENOUS
  Administered 2014-11-11: 0.5 mg via INTRAVENOUS

## 2014-11-11 MED ORDER — POTASSIUM 99 MG PO TABS: 99.0000 mg | ORAL_TABLET | Freq: Every day | ORAL | Status: DC

## 2014-11-11 MED ORDER — FAMOTIDINE IN NACL 20-0.9 MG/50ML-% IV SOLN
20.0000 mg | Freq: Two times a day (BID) | INTRAVENOUS | Status: DC
  Administered 2014-11-11 – 2014-11-14 (×7): 20 mg via INTRAVENOUS
  Filled 2014-11-11 (×9): qty 50

## 2014-11-11 MED ORDER — ACETAMINOPHEN 500 MG PO TABS
1000.0000 mg | ORAL_TABLET | Freq: Four times a day (QID) | ORAL | Status: AC
  Administered 2014-11-11 – 2014-11-12 (×4): 1000 mg via ORAL
  Filled 2014-11-11 (×4): qty 2

## 2014-11-11 MED ORDER — BUPIVACAINE-EPINEPHRINE (PF) 0.25% -1:200000 IJ SOLN
INTRAMUSCULAR | Status: AC
  Filled 2014-11-11: qty 30

## 2014-11-11 SURGICAL SUPPLY — 87 items
BLADE EXTENDED COATED 6.5IN (ELECTRODE) IMPLANT
CANNULA REDUC XI 12-8 STAPL (CANNULA) ×1
CANNULA REDUCER 12-8 DVNC XI (CANNULA) ×1 IMPLANT
CELLS DAT CNTRL 66122 CELL SVR (MISCELLANEOUS) ×1 IMPLANT
CLIP LIGATING HEM O LOK PURPLE (MISCELLANEOUS) IMPLANT
CLIP LIGATING HEMOLOK MED (MISCELLANEOUS) IMPLANT
COUNTER NEEDLE 20 DBL MAG RED (NEEDLE) ×2 IMPLANT
COVER MAYO STAND STRL (DRAPES) ×4 IMPLANT
COVER SURGICAL LIGHT HANDLE (MISCELLANEOUS) ×2 IMPLANT
COVER TIP SHEARS 8 DVNC (MISCELLANEOUS) ×1 IMPLANT
COVER TIP SHEARS 8MM DA VINCI (MISCELLANEOUS) ×1
DECANTER SPIKE VIAL GLASS SM (MISCELLANEOUS) ×2 IMPLANT
DEVICE TROCAR PUNCTURE CLOSURE (ENDOMECHANICALS) IMPLANT
DRAPE ARM DVNC X/XI (DISPOSABLE) ×4 IMPLANT
DRAPE COLUMN DVNC XI (DISPOSABLE) ×1 IMPLANT
DRAPE DA VINCI XI ARM (DISPOSABLE) ×4
DRAPE DA VINCI XI COLUMN (DISPOSABLE) ×1
DRAPE SURG IRRIG POUCH 19X23 (DRAPES) ×2 IMPLANT
DRSG OPSITE POSTOP 4X10 (GAUZE/BANDAGES/DRESSINGS) IMPLANT
DRSG OPSITE POSTOP 4X6 (GAUZE/BANDAGES/DRESSINGS) ×1 IMPLANT
DRSG OPSITE POSTOP 4X8 (GAUZE/BANDAGES/DRESSINGS) IMPLANT
ELECT PENCIL ROCKER SW 15FT (MISCELLANEOUS) ×4 IMPLANT
ELECT REM PT RETURN 15FT ADLT (MISCELLANEOUS) ×2 IMPLANT
ENDOLOOP SUT PDS II  0 18 (SUTURE)
ENDOLOOP SUT PDS II 0 18 (SUTURE) IMPLANT
EVACUATOR SILICONE 100CC (DRAIN) IMPLANT
GAUZE SPONGE 4X4 12PLY STRL (GAUZE/BANDAGES/DRESSINGS) IMPLANT
GLOVE BIO SURGEON STRL SZ 6.5 (GLOVE) ×7 IMPLANT
GLOVE BIOGEL PI IND STRL 7.0 (GLOVE) ×3 IMPLANT
GLOVE BIOGEL PI INDICATOR 7.0 (GLOVE) ×3
GOWN STRL REUS W/TWL 2XL LVL3 (GOWN DISPOSABLE) ×6 IMPLANT
GOWN STRL REUS W/TWL XL LVL3 (GOWN DISPOSABLE) ×8 IMPLANT
HOLDER FOLEY CATH W/STRAP (MISCELLANEOUS) ×2 IMPLANT
LEGGING LITHOTOMY PAIR STRL (DRAPES) ×2 IMPLANT
NDL INSUFFLATION 14GA 120MM (NEEDLE) ×1 IMPLANT
NEEDLE INSUFFLATION 14GA 120MM (NEEDLE) ×2 IMPLANT
PACK CARDIOVASCULAR III (CUSTOM PROCEDURE TRAY) ×2 IMPLANT
PACK COLON (CUSTOM PROCEDURE TRAY) ×2 IMPLANT
PEN SKIN MARKING BROAD (MISCELLANEOUS) ×2 IMPLANT
PORT LAP GEL ALEXIS MED 5-9CM (MISCELLANEOUS) IMPLANT
RELOAD STAPLE 45 BLU REG DVNC (STAPLE) IMPLANT
RELOAD STAPLE 45 GRN THCK DVNC (STAPLE) IMPLANT
RETRACTOR WND ALEXIS 18 MED (MISCELLANEOUS) IMPLANT
RTRCTR WOUND ALEXIS 18CM MED (MISCELLANEOUS) ×2
SCISSORS LAP 5X35 DISP (ENDOMECHANICALS) ×2 IMPLANT
SEAL CANN UNIV 5-8 DVNC XI (MISCELLANEOUS) ×3 IMPLANT
SEAL XI 5MM-8MM UNIVERSAL (MISCELLANEOUS) ×3
SEALER VESSEL DA VINCI XI (MISCELLANEOUS) ×1
SEALER VESSEL EXT DVNC XI (MISCELLANEOUS) ×1 IMPLANT
SET IRRIG TUBING LAPAROSCOPIC (IRRIGATION / IRRIGATOR) ×2 IMPLANT
SLEEVE XCEL OPT CAN 5 100 (ENDOMECHANICALS) IMPLANT
SOLUTION ELECTROLUBE (MISCELLANEOUS) ×2 IMPLANT
STAPLER 45 BLU RELOAD XI (STAPLE) ×5 IMPLANT
STAPLER 45 BLUE RELOAD XI (STAPLE) ×5
STAPLER 45 GREEN RELOAD XI (STAPLE)
STAPLER 45 GRN RELOAD XI (STAPLE) IMPLANT
STAPLER CANNULA SEAL DVNC XI (STAPLE) ×1 IMPLANT
STAPLER CANNULA SEAL XI (STAPLE) ×1
STAPLER SHEATH (SHEATH) ×1
STAPLER SHEATH ENDOWRIST DVNC (SHEATH) ×1 IMPLANT
STAPLER VISISTAT 35W (STAPLE) ×2 IMPLANT
SUT PDS AB 1 CTX 36 (SUTURE) IMPLANT
SUT PDS AB 1 TP1 96 (SUTURE) IMPLANT
SUT PROLENE 2 0 KS (SUTURE) ×2 IMPLANT
SUT SILK 2 0 (SUTURE) ×2
SUT SILK 2 0 SH CR/8 (SUTURE) ×2 IMPLANT
SUT SILK 2-0 18XBRD TIE 12 (SUTURE) ×1 IMPLANT
SUT SILK 3 0 (SUTURE) ×2
SUT SILK 3 0 SH CR/8 (SUTURE) ×2 IMPLANT
SUT SILK 3-0 18XBRD TIE 12 (SUTURE) ×1 IMPLANT
SUT V-LOC BARB 180 2/0GR6 GS22 (SUTURE) ×4
SUT VIC AB 2-0 SH 18 (SUTURE) ×2 IMPLANT
SUT VIC AB 2-0 SH 27 (SUTURE) ×2
SUT VIC AB 2-0 SH 27X BRD (SUTURE) ×1 IMPLANT
SUT VIC AB 3-0 SH 18 (SUTURE) IMPLANT
SUT VIC AB 4-0 PS2 27 (SUTURE) ×4 IMPLANT
SUTURE V-LC BRB 180 2/0GR6GS22 (SUTURE) IMPLANT
SYRINGE 10CC LL (SYRINGE) ×2 IMPLANT
SYS LAPSCP GELPORT 120MM (MISCELLANEOUS)
SYSTEM LAPSCP GELPORT 120MM (MISCELLANEOUS) IMPLANT
TOWEL OR 17X26 10 PK STRL BLUE (TOWEL DISPOSABLE) ×2 IMPLANT
TOWEL OR NON WOVEN STRL DISP B (DISPOSABLE) ×2 IMPLANT
TRAY FOLEY W/METER SILVER 14FR (SET/KITS/TRAYS/PACK) ×2 IMPLANT
TRAY FOLEY W/METER SILVER 16FR (SET/KITS/TRAYS/PACK) ×2 IMPLANT
TROCAR BLADELESS OPT 5 100 (ENDOMECHANICALS) ×2 IMPLANT
TUBING CONNECTING 10 (TUBING) IMPLANT
TUBING FILTER THERMOFLATOR (ELECTROSURGICAL) ×2 IMPLANT

## 2014-11-11 NOTE — Anesthesia Postprocedure Evaluation (Signed)
  Anesthesia Post-op Note  Patient: Samantha [name]  Procedure(s) Performed: Procedure(s) (LRB): XI ROBOT ASSISTED LAPAROSCOPIC PARTIAL COLECTOMY (N/A)  Patient Location: PACU  Anesthesia Type: General  Level of Consciousness: awake and alert   Airway and Oxygen Therapy: Patient Spontanous Breathing  Post-op Pain: mild  Post-op Assessment: Post-op Vital signs reviewed, Patient's Cardiovascular Status Stable, Respiratory Function Stable, Patent Airway and No signs of Nausea or vomiting  Last Vitals:  Filed Vitals:   11/11/14 1230  BP: 124/75  Pulse: 75  Temp: 36.8 C  Resp: 12    Post-op Vital Signs: stable   Complications: No apparent anesthesia complications

## 2014-11-11 NOTE — Op Note (Signed)
11/11/2014  11:27 AM  PATIENT:  Samantha [name]  74 y.o. female  Patient Care Team: Colon Branch, MD as PCP - General [name], MD as Consulting Physician (Cardiology) [name], MD as Consulting Physician (Obstetrics and Gynecology) [name], MD as Consulting Physician (General Surgery)  PRE-OPERATIVE DIAGNOSIS:  ASCENDING COLON CANCER  POST-OPERATIVE DIAGNOSIS:  ASCENDING COLON CANCER  PROCEDURE:  XI ROBOT ASSISTED LAPAROSCOPIC PARTIAL COLECTOMY    Surgeon(s): [name], MD  ASSISTANT: Dr Johney Maine   ANESTHESIA:   general  EBL:  Total I/O In: 1500 [I.V.:1400; IV Piggyback:100] Out: 270 [Urine:220; Blood:50]  SPECIMEN:  Source of Specimen:  R colon  DISPOSITION OF SPECIMEN:  PATHOLOGY  COUNTS:  YES  PLAN OF CARE: Admit to inpatient   PATIENT DISPOSITION:  PACU - hemodynamically stable.   INDICATIONS: This is a 74 y.o. female who presented to my office with invasive colon cancer down to the base of an endoscopically resected polyp. It was recommended that the patient have a colectomy to evaluate for any residual cancer or lymph node involvement.  The risk and benefits and alternative treatments were explained to the patient prior to the OR and the patient has elected to proceed with robotic assisted R colectomy.  Consent was signed and placed on chart prior to the OR.  OR FINDINGS:  R colon, tortuous vessels.  DESCRIPTION:  The patient was identified & brought into the operating room. The patient was positioned supine with both arms tucked. SCDs were active during the entire case. The patient underwent general anesthesia without any difficulty. A foley catheter was inserted under sterile conditions. The abdomen was prepped and draped in a sterile fashion. A Surgical Timeout confirmed our plan.   I entered the abdomen with a Varies needle in the LUQ.  We induced carbon dioxide insufflation. I placed an 36mm robotic trochar.  Camera inspection revealed  no injury. I placed additional ports under direct laparoscopic visualization.  I evaluated the entire abdomen laparoscopically.  The liver appeared normal, the large and small bowel were normal as well.  There were no signs of metastatic disease.  The robot was docked to the patient's left side.  Instruments were placed under direct visualization.     I began by identifying the ileocolic artery and vein within the mesentery. Dissection was bluntly carried around these structures. The duodenum was identified and free from the structures. I then separated the structures bluntly and used the robotic vessel sealer device to transect these separately.  I developed the retroperitoneal plane bluntly.  I then freed the appendix off its attachments to the pelvic wall. I mobilized the terminal ileum.  I took care to avoid injuring any retroperitoneal structures.  After this I began to mobilize laterally down the white line of [place] and then took down the hepatic flexure using the vessel sealer device. I mobilized the omentum off of the right transverse colon. The entire colon was then flipped medially and mobilized off of the retroperitoneal structures until I could visualize the lateral edge of the duodenum underneath.  I gently freed the duodenal attachments.  I divided the remaining mesentery to the level of the colon wall. At that point, I stapled the transverse colon robotically just proximal to the right branch of the middle colic artery.  This required 2 blue load stapler fires.  I then divided the terminal ileum in similar fashion.  The colon was free at that point.    I then brought the  terminal ileum up to the remaining transverse colon and secured the 2 together. I intentionally made 2 enterotomies in the small bowel and the colon in a side-to-side fashion. I then used 2 blue load robotic stapler fires to create an anastomosis between the 2. I then used 2, V LOC 2-0 sutures to close the common enterotomy  channel.  The omentum was secured over this. The needles were removed and the abdomen was irrigated with normal saline. The omentum was brought down over the remaining bowel.  We secured the specimen and enlarged the 12 mm port in the patient's left upper quadrant using electrocautery. The robot was undocked and stored.  An [place] wound protector was placed. The colon was removed from the abdomen and sent to pathology for further examination. The abdomen was irrigated further.  The [place] wound protector was removed, and we switched to clean instruments, gowns and drapes.   The fascia was then closed using #0 Novafil interrupted sutures. The subcutaneous tissue was closed using interrupted 2-0 Vicryl sutures. The skin was then closed using 4-0 Vicryl sutures. Dermabond was placed on the port sites and a sterile dressing was placed over the abdominal incision. All counts were correct per operating room staff. The patient was then awakened from anesthesia and sent to the post anesthesia care unit in stable condition.

## 2014-11-11 NOTE — Transfer of Care (Signed)
Immediate Anesthesia Transfer of Care Note  Patient: Samantha [name]  Procedure(s) Performed: Procedure(s): XI ROBOT ASSISTED LAPAROSCOPIC PARTIAL COLECTOMY (N/A)  Patient Location: PACU  Anesthesia Type:General  Level of Consciousness: awake and oriented  Airway & Oxygen Therapy: Patient Spontanous Breathing and Patient connected to face mask oxygen  Post-op Assessment: Report given to RN and Post -op Vital signs reviewed and stable  Post vital signs: Reviewed and stable  Last Vitals:  Filed Vitals:   11/11/14 0551  BP: 140/69  Pulse: 76  Temp: 36.6 C  Resp: 18    Complications: No apparent anesthesia complications

## 2014-11-11 NOTE — H&P (Signed)
History of Present Illness [name] MD; 02/03/1094 10:40 AM) The patient is a 74 year old female who presents with colorectal cancer. 74 year old female who presents to the office as a referral from [name]. She underwent a colonoscopy due to rectal bleeding. A flat polypoid lesion was found adjacent to the IC valve. This measured approximately 1 cm across and was removed with snare cautery. 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 CT scans of chest abdomen and pelvis showed no signs of metastatic disease. Her CEA was less than 0.5. She has occasional right-sided abdominal pain. She has irregular bowel movements and loose stools. Other Problems Marjean Donna, CMA; 09/07/2014 9:50 AM) Arthritis Asthma Cancer Cerebrovascular Accident Chest pain Colon Cancer Congestive Heart Failure Diabetes Mellitus Gastroesophageal Reflux Disease Heart murmur High blood pressure Migraine Headache Sleep Apnea Vascular Disease  Past Surgical History Marjean Donna, CMA; 09/07/2014 9:50 AM) Breast Biopsy Right. Cataract Surgery Bilateral. Foot Surgery Left. Hysterectomy (not due to cancer) - Partial Oral Surgery Spinal Surgery - Neck Thyroid Surgery  Diagnostic Studies History Marjean Donna, CMA; 09/07/2014 9:50 AM) Colonoscopy within last year Mammogram within last year  Allergies Davy Pique Bynum, CMA; 09/07/2014 9:53 AM) OxyCODONE HCl (Abuse [place]) *ANALGESICS - OPIOID* Seldane *ANTIHISTAMINES* Zocor *ANTIHYPERLIPIDEMICS* TraMADol HCl *ANALGESICS - OPIOID* Bactrim *ANTI-INFECTIVE AGENTS - MISC.* Pravastatin Sodium *ANTIHYPERLIPIDEMICS*  Medication History (Sonya Bynum, CMA; 09/07/2014 9:54 AM) Atorvastatin Calcium (10MG  Tablet, Oral) Active. Lyrica (100MG  Capsule, Oral) Active. Canasa (1000MG  Suppository, Rectal) Active. Bystolic (10MG  Tablet, Oral) Active. Cyclobenzaprine HCl (10MG  Tablet, Oral) Active. Lisinopril (40MG  Tablet, Oral)  Active. MetFORMIN HCl ER (OSM) (500MG  Tablet ER 24HR, Oral) Active. Ranitidine HCl (300MG  Tablet, Oral) Active. Furosemide (20MG  Tablet, Oral) Active. Clopidogrel Bisulfate (75MG  Tablet, Oral) Active. DULoxetine HCl (60MG  Capsule DR Part, Oral) Active. Medications Reconciled  Social History Marjean Donna, CMA; 09/07/2014 9:50 AM) Caffeine use Coffee, Tea. No alcohol use No drug use Tobacco use Former smoker.  Family History Marjean Donna, [place]; 09/07/2014 9:50 AM) Arthritis Mother. Cerebrovascular Accident Family Members In General. Diabetes Mellitus Brother, Daughter, Mother. Heart Disease Brother, Father, Mother, Sister. Heart disease in female family member before age 72 Hypertension Daughter, Father, Mother. Kidney Disease Brother. Respiratory Condition Brother, Mother.  Pregnancy / Birth History Marjean Donna, [place]; 09/07/2014 9:50 AM) Age at menarche 34 years. Contraceptive History Oral contraceptives. Gravida 2 Maternal age 2-25 Para 2     Review of Systems ([place]; 09/07/2014 9:50 AM) General Present- Chills and Fatigue. Not Present- Appetite Loss, Fever, Night Sweats, Weight Gain and Weight Loss. Skin Not Present- Change in Wart/Mole, Dryness, Hives, Jaundice, New Lesions, Non-Healing Wounds, Rash and Ulcer. HEENT Present- Hoarseness and Sore Throat. Not Present- Earache, Hearing Loss, Nose Bleed, Oral Ulcers, Ringing in the Ears, Seasonal Allergies, Sinus Pain, Visual Disturbances, Wears glasses/contact lenses and Yellow Eyes. Respiratory Present- Chronic Cough, Difficulty Breathing and Wheezing. Not Present- Bloody sputum and Snoring. Cardiovascular Present- Chest Pain, Palpitations, Shortness of Breath and Swelling of Extremities. Not Present- Difficulty Breathing Lying Down, Leg Cramps and Rapid Heart Rate. Gastrointestinal Present- Abdominal Pain, Bloating, Bloody Stool, Nausea and Rectal Pain. Not Present- Change in Bowel Habits, Chronic  diarrhea, Constipation, Difficulty Swallowing, Excessive gas, Gets full quickly at meals, Hemorrhoids, Indigestion and Vomiting. Female Genitourinary Not Present- Frequency, Nocturia, Painful Urination, Pelvic Pain and Urgency. Musculoskeletal Present- Joint Pain. Not Present- Back Pain, Joint Stiffness, Muscle Pain, Muscle Weakness and Swelling of Extremities. Neurological Present- Tingling. Not Present- Decreased Memory, Fainting, Headaches, Numbness, Seizures, Tremor, Trouble walking and Weakness. Psychiatric Not  Present- Anxiety, Bipolar, Change in Sleep Pattern, Depression, Fearful and Frequent crying. Endocrine Not Present- Cold Intolerance, Excessive Hunger, Hair Changes, Heat Intolerance, Hot flashes and New Diabetes. Hematology Present- Easy Bruising and Excessive bleeding. Not Present- Gland problems, HIV and Persistent Infections.  BP 140/69 mmHg  Pulse 76  Temp(Src) 97.9 F (36.6 C) (Oral)  Resp 18  Ht 5\' 2"  (1.575 m)  Wt 89.359 kg (197 lb)  BMI 36.02 kg/m2  SpO2 99%   Physical Exam [name] MD; 01/31/2438 10:40 AM)  General Mental Status-Alert. General Appearance-Consistent with stated age. Hydration-Well hydrated. Voice-Normal.  Head and Neck Head-normocephalic, atraumatic with no lesions or palpable masses. Trachea-midline. Thyroid Gland Characteristics - normal size and consistency.  Eye Eyeball - Bilateral-Extraocular movements intact. Sclera/Conjunctiva - Bilateral-No scleral icterus.  Chest and Lung Exam Chest and lung exam reveals -quiet, even and easy respiratory effort with no use of accessory muscles and on auscultation, normal breath sounds, no adventitious sounds and normal vocal resonance. Inspection Chest Wall - Normal. Back - normal.  Cardiovascular Cardiovascular examination reveals -normal heart sounds, regular rate and rhythm with no murmurs and normal pedal pulses bilaterally.  Abdomen Inspection Inspection of the  abdomen reveals - No Hernias. Palpation/Percussion Palpation and Percussion of the abdomen reveal - Soft, Non Tender, No Rebound tenderness, No Rigidity (guarding) and No hepatosplenomegaly. Auscultation Auscultation of the abdomen reveals - Bowel sounds normal.  Neurologic Neurologic evaluation reveals -alert and oriented x 3 with no impairment of recent or remote memory. Mental Status-Normal.  Musculoskeletal Global Assessment -Note:no gross deformities.  Normal Exam - Left-Upper Extremity Strength Normal and Lower Extremity Strength Normal. Normal Exam - Right-Upper Extremity Strength Normal and Lower Extremity Strength Normal.    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. We discussed these findings in detail. I have recommended a minimally invasive right hemicolectomy. All questions were answered. I will discuss this surgery with her primary care physician Dr. Larose Kells and her cardiologist [name] prior to scheduling. She will need to be off her Plavix approximately 5 days before surgery.  We discussed that her loose stools may get worse after surgery. The surgery and anatomy were described to the patient as well as the risks of surgery and the possible complications. These include: Bleeding, deep abdominal infections and possible wound complications such as hernia and infection, damage to adjacent structures, leak of surgical connections, which can lead to other surgeries and possibly an ostomy, possible need for other procedures, such as abscess drains in radiology, possible prolonged hospital stay, possible diarrhea from removal of part of the colon, possible constipation from narcotics, possible bowel, bladder or  sexual dysfunction if having rectal surgery, prolonged fatigue/weakness or appetite loss, possible early recurrence of of disease, possible complications of their medical problems such as heart disease or arrhythmias or lung problems, death (less than 1%). I believe the patient understands and wishes to proceed with the surgery.

## 2014-11-11 NOTE — Anesthesia Preprocedure Evaluation (Addendum)
Anesthesia Evaluation  Patient identified by MRN, date of birth, ID band Patient awake  General Assessment Comment: Past Medical History Diagnosis Date . Hyperlipidemia  . Hypertension  . Chest pain, atypical 11/2006   saw cards: neg/normal Holter, ECHo and myoview, admitted w/ CP 4/09 neg enz.and CT chest had a 9 beat NSVT . Fibromyalgia  . Abnormal CT of the chest 2008   last CT4-l 2009: . No f/u suggested  . Tumor, 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 . Shingles 11/2009 . Vaginal dysplasia  . Type II diabetes mellitus ([place])  . Heart murmur  . Asthma  . H/O hiatal hernia  . GERD (gastroesophageal reflux disease)  . Ocular migraine  . TIA (transient ischemic attack) 06/27/2012   "this is my first" (06/27/2012) and one 2 months later . Rheumatoid factor positive  . Osteoarthritis  . Reactive airway disease 01/29/2002   dx of pseudoasthma / vcd in 2005 and nl sprirometry History of dyspnea, 2011, improved after several medications were changed around Question of COPD, disproved July 06, 2009 with nl pft's  . TIA (transient ischemic attack)    x2 . OSA (obstructive sleep apnea) 09/2007   dx w/ a sleep study, not on CPAP . Pneumonia    "double" in 2004 . 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 Malignant neoplasm of ascending colon ([place]) 2016   Minimally invasive right hemicolectomy to be done  . Complication of anesthesia    trouble waking up . PONV (postoperative nausea and vomiting)  . Torn rotator cuff    right worse than left, both are torn . CHF (congestive heart failure) ([facility])    ischemia        Heart murmur High blood pressure Migraine Headache Sleep Apnea Vascular Disease  Reviewed: Allergy & Precautions, NPO status , Patient's Chart, lab work & pertinent test results  History of Anesthesia  Complications (+) PONV and history of anesthetic complications  Airway Mallampati: II  TM Distance: >3 FB Neck ROM: Full    Dental no notable dental hx.    Pulmonary shortness of breath, asthma , sleep apnea , pneumonia, former smoker,    Pulmonary exam normal breath sounds clear to auscultation       Cardiovascular Exercise Tolerance: Good hypertension, Pt. on medications and Pt. on home beta blockers + CAD, + Peripheral Vascular Disease and +CHF  Normal cardiovascular exam+ Valvular Problems/Murmurs  Rhythm:Regular Rate:Normal  Study Conclusions  - Left ventricle: The cavity size was normal. There was focal basal and moderate concentric hypertrophy. Systolic function was normal. The estimated ejection fraction was in the range of 60% to 65%. Wall motion was normal; there were no regional wall motion abnormalities. Doppler parameters are consistent with abnormal left ventricular relaxation (grade 1 diastolic dysfunction).  Cardiology office note from 11-03-14 [name] reviewed. Recent cardiac evaluation is low risk.    Neuro/Psych  Headaches, TIA Neuromuscular disease negative psych ROS   GI/Hepatic Neg liver ROS, hiatal hernia, GERD  Medicated,  Endo/Other  diabetes, Type 2, Oral Hypoglycemic Agents  Renal/GU negative Renal ROS  negative genitourinary   Musculoskeletal  (+) Arthritis , Fibromyalgia -  Abdominal (+) + obese,   Peds negative pediatric ROS (+)  Hematology negative hematology ROS (+)   Anesthesia Other Findings   Reproductive/Obstetrics negative OB ROS                        Anesthesia Physical Anesthesia Plan  ASA: III  Anesthesia  Plan: General   Post-op Pain Management:    Induction: Intravenous  Airway Management Planned: Oral ETT  Additional Equipment:   Intra-op Plan:   Post-operative Plan: Extubation in OR  Informed Consent: I have reviewed the patients History and Physical,  chart, labs and discussed the procedure including the risks, benefits and alternatives for the proposed anesthesia with the patient or authorized representative who has indicated his/her understanding and acceptance.   Dental advisory given  Plan Discussed with: CRNA  Anesthesia Plan Comments:         Anesthesia Quick Evaluation

## 2014-11-11 NOTE — Anesthesia Procedure Notes (Addendum)
Procedure Name: Intubation Date/Time: 11/11/2014 7:44 AM Performed by: Sharlette Dense Pre-anesthesia Checklist: Patient identified, Emergency Drugs available, Suction available and Patient being monitored Patient Re-evaluated:Patient Re-evaluated prior to inductionOxygen Delivery Method: Circle system utilized Preoxygenation: Pre-oxygenation with 100% oxygen Intubation Type: IV induction Ventilation: Mask ventilation without difficulty Laryngoscope Size: 4 and Glidescope Grade View: Grade II Tube type: Oral Tube size: 7.5 mm Number of attempts: 2 (both views by Ileana Roup) Airway Equipment and Method: Oral airway,  Video-laryngoscopy and Rigid stylet Placement Confirmation: ETT inserted through vocal cords under direct vision and positive ETCO2 Secured at: 22 cm Tube secured with: Tape Dental Injury: Teeth and Oropharynx as per pre-operative assessment  Difficulty Due To: Difficulty was anticipated and Difficult Airway- due to reduced neck mobility Comments: 1st DL with Mac 4, unable to visualize cords, mask ventilated while glidescope being prepared.  Pt then successfully intubated with Glidescope 3 blade. Visualized ET tube through cords. No trauma to lips, teeth or mouth. Verified placement per ETCO2 and direct visualization.

## 2014-11-12 LAB — GLUCOSE, CAPILLARY
GLUCOSE-CAPILLARY: 126 mg/dL — AB (ref 65–99)
Glucose-Capillary: 143 mg/dL — ABNORMAL HIGH (ref 65–99)
Glucose-Capillary: 112 mg/dL — ABNORMAL HIGH (ref 65–99)
Glucose-Capillary: 112 mg/dL — ABNORMAL HIGH (ref 65–99)

## 2014-11-12 LAB — BASIC METABOLIC PANEL
Anion gap: 4 — ABNORMAL LOW (ref 5–15)
BUN: 11 mg/dL (ref 6–20)
CHLORIDE: 108 mmol/L (ref 101–111)
CO2: 24 mmol/L (ref 22–32)
CREATININE: 0.75 mg/dL (ref 0.44–1.00)
Calcium: 8.1 mg/dL — ABNORMAL LOW (ref 8.9–10.3)
GFR calc Af Amer: >60 mL/min (ref >60)
GFR calc non Af Amer: >60 mL/min (ref >60)
Glucose, Bld: 254 mg/dL — ABNORMAL HIGH (ref 65–99)
Potassium: 4.6 mmol/L (ref 3.5–5.1)
Sodium: 136 mmol/L (ref 135–145)

## 2014-11-12 LAB — CBC
HCT: 33.7 % — ABNORMAL LOW (ref 36.0–46.0)
Hemoglobin: 11.2 g/dL — ABNORMAL LOW (ref 12.0–15.0)
MCH: 28.4 pg (ref 26.0–34.0)
MCHC: 33.2 g/dL (ref 30.0–36.0)
MCV: 85.5 fL (ref 78.0–100.0)
PLATELETS: 140 10*3/uL — AB (ref 150–400)
RBC: 3.94 MIL/uL (ref 3.87–5.11)
RDW: 14.3 % (ref 11.5–15.5)
WBC: 7.9 10*3/uL (ref 4.0–10.5)

## 2014-11-12 MED ORDER — MENTHOL 3 MG MT LOZG
1.0000 | LOZENGE | OROMUCOSAL | Status: DC | PRN
  Filled 2014-11-12: qty 9

## 2014-11-12 MED ORDER — HYDROCODONE-ACETAMINOPHEN 5-325 MG PO TABS
1.0000 | ORAL_TABLET | ORAL | Status: DC | PRN
  Administered 2014-11-12: 1 via ORAL
  Administered 2014-11-14: 2 via ORAL
  Administered 2014-11-14 (×2): 1 via ORAL
  Administered 2014-11-14: 2 via ORAL
  Administered 2014-11-14 – 2014-11-15 (×2): 1 via ORAL
  Administered 2014-11-15 (×2): 2 via ORAL
  Administered 2014-11-15: 1 via ORAL
  Administered 2014-11-16 (×2): 2 via ORAL
  Filled 2014-11-12 (×2): qty 1
  Filled 2014-11-12: qty 2
  Filled 2014-11-12 (×2): qty 1
  Filled 2014-11-12 (×3): qty 2
  Filled 2014-11-12: qty 1
  Filled 2014-11-12: qty 2
  Filled 2014-11-12: qty 1
  Filled 2014-11-12: qty 2

## 2014-11-12 NOTE — Progress Notes (Signed)
Inpatient Diabetes Program Recommendations  AACE/ADA: New Consensus Statement on Inpatient Glycemic Control (2015)  Target Ranges:  Prepandial:   less than 140 mg/dL      Peak postprandial:   less than 180 mg/dL (1-2 hours)      Critically ill patients:  140 - 180 mg/dL   Review of Glycemic Control  Diabetes history: DM2 Outpatient Diabetes medications: metformin 500 mg QAM Current orders for Inpatient glycemic control: Novolog resistant tidwc and hs, metformin 500 mg QAM  Results for Samantha [name], Samantha [name] (MRN [id number]) as of 11/12/2014 11:52  Ref. Range 11/03/2014 10:15  Hemoglobin A1C Latest Ref Range: 4.8-5.6 % 6.5 (H)  Results for Samantha [name], Samantha [name] (MRN [id number]) as of 11/12/2014 11:52  Ref. Range 11/11/2014 05:54 11/11/2014 11:20 11/11/2014 17:29 11/11/2014 22:02 11/12/2014 07:56  Glucose-Capillary Latest Ref Range: 65-99 mg/dL 106 (H) 171 (H) 190 (H) 171 (H) 143 (H)  Results for Samantha [name], Samantha [name] (MRN [id number]) as of 11/12/2014 11:52  Ref. Range 11/12/2014 05:35  Glucose Latest Ref Range: 65-99 mg/dL 254 (H)   FBS elevated. On CL diet.  Inpatient Diabetes Program Recommendations:    Change Novolog moderate to Q4H while on CL diet. Advance to tidwc and hs when pt is eating CHO mod diet. May need small amount of basal insulin if FBS > 180 mg/dL. Recommend Lantus 10 units QHS  Will continue to follow. Thank you. Lorenda Peck, RD, LDN, CDE Inpatient Diabetes Coordinator [phone]

## 2014-11-12 NOTE — Discharge Instructions (Addendum)
SURGERY: POST OP INSTRUCTIONS °(Surgery for small bowel obstruction, colon resection, etc)  ° °1. DIET: Follow a light bland diet the first 24 hours after arrival home, such as soup, liquids, crackers, etc.  Be sure to include lots of fluids daily.  Avoid fast food or heavy meals as your are more likely to get nauseated.  Stay on a low fat diet the next few days after surgery.  Gradually add a fiber supplement to your diet over the next week.   Your should try to eat a low-fat, high fiber diet the rest of your life thereafter (See Below). °  °2. Take your usually prescribed home medications unless otherwise directed.  OK to take aspirin.    If you are on strong blood thinners (warfarin/Coumadin, Plavix, Xerelto, Eliquis, etc), discuss with your surgeon, medicine PCP, and/or cardiologist for instructions on when to restart the blood thinner & if blood monitoring is needed (PT/INR blood check, etc).  Usually you can restart any strong blood thinners after the second postoperative day. ° °3. PAIN CONTROL: ° °Pain after surgery or related to activity is often due to strain/injury to muscle, tendon, nerves and/or incisions.  This pain is usually short-term and will improve in a few months.  ° °Many people find it helpful to do the following things TOGETHER to help speed the process of healing and to get back to regular activity more quickly: ° °1. Avoid heavy physical activity at first °a. No lifting greater than 20 pounds at first, then increase to lifting as tolerated over the next few weeks °b. Do not “push through” the pain.  Listen to your body and avoid positions and maneuvers than reproduce the pain.  Wait a few days before trying something more intense °c. Walking is okay as tolerated, but go slowly and stop when getting sore.  If you can walk 30 minutes without stopping or pain, you can try more intense activity (running, jogging, aerobics, cycling, swimming, treadmill, sex, sports, weightlifting, etc  ) °d. Remember: If it hurts to do it, then don’t do it! ° °2. Take Anti-inflammatory medication °a. Choose ONE of the following over-the-counter medications: °i.            Acetaminophen 500mg tabs (Tylenol) 1-2 pills with every meal and just before bedtime (avoid if you have liver problems) °ii.            Naproxen 220mg tabs (ex. Aleve) 1-2 pills twice a day (avoid if you have kidney, stomach, IBD, or bleeding problems) °iii. Ibuprofen 200mg tabs (ex. Advil, Motrin) 3-4 pills with every meal and just before bedtime (avoid if you have kidney, stomach, IBD, or bleeding problems) °b. Take with food/snack around the clock for 1-2 weeks °i. This helps the muscle and nerve tissues become less irritable and calm down faster ° °3. Use a Heating pad or Ice/Cold Pack °a. Most patients will experience some swelling and bruising around the incisions.  Swelling and bruising can take several weeks to resolve. °i. Ice packs or heating pads (30-60 minutes up to 6 times a day) will help. °ii. Use ice for the first few days to help decrease swelling and bruising °iii. Switch to heat to help relax tight/sore spots and speed recovery.  °iv. Some people prefer to use ice alone, heat alone, alternating between ice & heat.  Experiment to what works for you °a. May use warm bath/hottub  or showers ° °4. Try Gentle Massage and/or Stretching  °a. at the area of   pain many times a day °b. stop if you feel pain - do not overdo it °5. Prescription for pain medication (such as oxycodone, hydrocodone, etc) should be given to you upon discharge.  Take your pain medication as prescribed. °a. If you are having problems/concerns with the prescription medicine (does not control pain, nausea, vomiting, rash, itching, etc), please call us [phone] to see if we need to switch you to a different pain medicine that will work better for you and/or control your side effect better. °b. If you need a refill on your pain medication, please contact your  pharmacy.  They will contact our office to request authorization. Prescriptions will not be filled after 5 pm or on week-ends. °c.  °Try these steps together to help you body heal faster and avoid making things get worse.  Doing just one of these things may not be enough.   ° °If you are not getting better after two weeks or are noticing you are getting worse, contact our office for further advice; we may need to re-evaluate you & see what other things we can do to help. ° ° °GETTING TO GOOD BOWEL HEALTH. °Irregular bowel habits such as constipation and diarrhea can lead to many problems over time.  Having one soft bowel movement a day is the most important way to prevent further problems.  The anorectal canal is designed to handle stretching and feces to safely manage our ability to get rid of solid waste (feces, poop, stool) out of our body.  BUT, hard constipated stools can act like ripping concrete bricks and diarrhea can be a burning fire to this very sensitive area of our body, causing inflamed hemorrhoids, anal fissures, increasing risk is perirectal abscesses, abdominal pain/bloating, an making irritable bowel worse.     ° °The goal: ONE SOFT BOWEL MOVEMENT A DAY!  To have soft, regular bowel movements:  °• Drink plenty of fluids, consider 4-6 tall glasses of water a day.   °• Take plenty of fiber.  Fiber is the undigested part of plant food that passes into the colon, acting s “natures broom” to encourage bowel motility and movement.  Fiber can absorb and hold large amounts of water. This results in a larger, bulkier stool, which is soft and easier to pass. Work gradually over several weeks up to 6 servings a day of fiber (25g a day even more if needed) in the form of: °o Vegetables -- Root (potatoes, carrots, turnips), leafy green (lettuce, salad greens, celery, spinach), or cooked high residue (cabbage, broccoli, etc) °o Fruit -- Fresh (unpeeled skin & pulp), Dried (prunes, apricots, cherries, etc ),  or  stewed ( applesauce)  °o Whole grain breads, pasta, etc (whole wheat)  °o Bran cereals  °• Bulking Agents -- This type of water-retaining fiber generally is easily obtained each day by one of the following:  °o Psyllium bran -- The psyllium plant is remarkable because its ground seeds can retain so much water. This product is available as Metamucil, Konsyl, Effersyllium, Per Diem Fiber, or the less expensive generic preparation in drug and health food stores. Although labeled a laxative, it really is not a laxative.  °o Methylcellulose -- This is another fiber derived from wood which also retains water. It is available as Citrucel. °o Polyethylene Glycol - and “artificial” fiber commonly called Miralax or Glycolax.  It is helpful for people with gassy or bloated feelings with regular fiber °o Flax Seed - a less gassy fiber than   psyllium °• No reading or other relaxing activity while on the toilet. If bowel movements take longer than 5 minutes, you are too constipated °• AVOID CONSTIPATION.  High fiber and water intake usually takes care of this.  Sometimes a laxative is needed to stimulate more frequent bowel movements, but  °• Laxatives are not a good long-term solution as it can wear the colon out.  They can help jump-start bowels if constipated, but should be relied on constantly without discussing with your doctor °o Osmotics (Milk of Magnesia, Fleets phosphosoda, Magnesium citrate, MiraLax, GoLytely) are safer than  °o Stimulants (Senokot, Castor Oil, Dulcolax, Ex Lax)    °o Avoid taking laxatives for more than 7 days in a row. °•  IF SEVERELY CONSTIPATED, try a Bowel Retraining Program: °o Do not use laxatives.  °o Eat a diet high in roughage, such as bran cereals and leafy vegetables.  °o Drink six (6) ounces of prune or apricot juice each morning.  °o Eat two (2) large servings of stewed fruit each day.  °o Take one (1) heaping tablespoon of a psyllium-based bulking agent twice a day. Use sugar-free  sweetener when possible to avoid excessive calories.  °o Eat a normal breakfast.  °o Set aside 15 minutes after breakfast to sit on the toilet, but do not strain to have a bowel movement.  °o If you do not have a bowel movement by the third day, use an enema and repeat the above steps.  °• Controlling diarrhea °o Switch to liquids and simpler foods for a few days to avoid stressing your intestines further. °o Avoid dairy products (especially milk & ice cream) for a short time.  The intestines often can lose the ability to digest lactose when stressed. °o Avoid foods that cause gassiness or bloating.  Typical foods include beans and other legumes, cabbage, broccoli, and dairy foods.  Every person has some sensitivity to other foods, so listen to our body and avoid those foods that trigger problems for you. °o Adding fiber (Citrucel, Metamucil, psyllium, Miralax) gradually can help thicken stools by absorbing excess fluid and retrain the intestines to act more normally.  Slowly increase the dose over a few weeks.  Too much fiber too soon can backfire and cause cramping & bloating. °o Probiotics (such as active yogurt, Align, etc) may help repopulate the intestines and colon with normal bacteria and calm down a sensitive digestive tract.  Most studies show it to be of mild help, though, and such products can be costly. °o Medicines: °- Bismuth subsalicylate (ex. Kayopectate, Pepto Bismol) every 30 minutes for up to 6 doses can help control diarrhea.  Avoid if pregnant. °- Loperamide (Immodium) can slow down diarrhea.  Start with two tablets (4mg total) first and then try one tablet every 6 hours.  Avoid if you are having fevers or severe pain.  If you are not better or start feeling worse, stop all medicines and call your doctor for advice °o Call your doctor if you are getting worse or not better.  Sometimes further testing (cultures, endoscopy, X-ray studies, bloodwork, etc) may be needed to help diagnose and treat  the cause of the diarrhea. ° °TROUBLESHOOTING IRREGULAR BOWELS °1) Avoid extremes of bowel movements (no bad constipation/diarrhea) °2) Miralax 17gm mixed in 8oz. water or juice-daily. May use BID as needed.  °3) Gas-x,Phazyme, etc. as needed for gas & bloating.  °4) Soft,bland diet. No spicy,greasy,fried foods.  °5) Prilosec over-the-counter as needed  °6) May hold   gluten/wheat products from diet to see if symptoms improve.  7)  May try probiotics (Align, Activa, etc) to help calm the bowels down 7) If symptoms become worse call back immediately.   4. Wash / shower every day.  You may shower over the incision / wound.  Avoid baths until 5 days after surgery.  Continue to shower over incision(s) after the dressing is off.  5. Remove your waterproof bandages 5 days after surgery.  You may leave the incision open to air.  Remove any wicks or ribbons in your wound.  If you have an open wound, please see wound care instructions. You may replace a dressing/Band-Aid to cover the incision for comfort if you wish.  6. ACTIVITIES as tolerated:   a. You may resume regular (light) daily activities beginning the next day--such as daily self-care, walking, climbing stairs--gradually increasing activities as tolerated.  If you can walk 30 minutes without difficulty, it is safe to try more intense activity such as jogging, treadmill, bicycling, low-impact aerobics, swimming, etc. b. Save the most intensive and strenuous activity for last (Usually 3-6 weeks after surgery) such as sit-ups, heavy lifting, contact sports, etc  Refrain from any heavy lifting or straining until you are off narcotics for pain control.   c. DO NOT PUSH THROUGH PAIN.  Let pain be your guide: If it hurts to do something, don't do it.  Pain is your body warning you to avoid that activity for another week until the pain goes down. d. You may drive when you are no longer taking prescription pain medication, you can comfortably wear a seatbelt, and  you can safely maneuver your car and apply brakes. e. Dennis Bast may have sexual intercourse when it is comfortable. If it hurts to do something, don't do it.  7. FOLLOW UP in our office a. Please call [facility] at [phone] to set up an appointment to see your surgeon in the office for a follow-up appointment approximately 2-3 weeks after your surgery. b. Make sure that you call for this appointment the day you arrive home to insure a convenient appointment time.  8. IF YOU HAVE DISABILITY OR FAMILY LEAVE FORMS, BRING THEM TO THE OFFICE FOR PROCESSING.  DO NOT GIVE THEM TO YOUR DOCTOR.   WHEN TO CALL us [phone]: 1. Poor pain control 2. Reactions / problems with new medications (rash/itching, nausea, etc)  3. Fever over 101.5 F (38.5 C) 4. Inability to urinate 5. Nausea and/or vomiting 6. Worsening swelling or bruising 7. Continued bleeding from incision. 8. Increased pain, redness, or drainage from the incision  The clinic staff is available to answer your questions during regular business hours (8:30am-5pm).  Please dont hesitate to call and ask to speak to one of our nurses for clinical concerns.   A surgeon from [facility] is always on call at the hospitals   If you have a medical emergency, go to the nearest emergency room or call 911.    [facility], [place], [place], [place], [place]  [address] ? MAIN: [phone] ? TOLL FREE: [phone] ? FAX [phone] [email or web address]   GETTING TO GOOD BOWEL HEALTH. Irregular bowel habits such as constipation and diarrhea can lead to many problems over time.  Having one soft bowel movement a day is the most important way to prevent further problems.  The anorectal canal is designed to handle stretching and feces to safely manage our ability to get rid of solid  waste (feces, poop, stool) out of our body.  BUT, hard constipated stools can act like ripping concrete bricks and  diarrhea can be a burning fire to this very sensitive area of our body, causing inflamed hemorrhoids, anal fissures, increasing risk is perirectal abscesses, abdominal pain/bloating, an making irritable bowel worse.      The goal: ONE SOFT BOWEL MOVEMENT A DAY!  To have soft, regular bowel movements:   Drink plenty of fluids, consider 4-6 tall glasses of water a day.    Take plenty of fiber.  Fiber is the undigested part of plant food that passes into the colon, acting s natures broom to encourage bowel motility and movement.  Fiber can absorb and hold large amounts of water. This results in a larger, bulkier stool, which is soft and easier to pass. Work gradually over several weeks up to 6 servings a day of fiber (25g a day even more if needed) in the form of: o Vegetables -- Root (potatoes, carrots, turnips), leafy green (lettuce, salad greens, celery, spinach), or cooked high residue (cabbage, broccoli, etc) o Fruit -- Fresh (unpeeled skin & pulp), Dried (prunes, apricots, cherries, etc ),  or stewed ( applesauce)  o Whole grain breads, pasta, etc (whole wheat)  o Bran cereals   Bulking Agents -- This type of water-retaining fiber generally is easily obtained each day by one of the following:  o Psyllium bran -- The psyllium plant is remarkable because its ground seeds can retain so much water. This product is available as Metamucil, Konsyl, Effersyllium, Per Diem Fiber, or the less expensive generic preparation in drug and health food stores. Although labeled a laxative, it really is not a laxative.  o Methylcellulose -- This is another fiber derived from wood which also retains water. It is available as Citrucel. o Polyethylene Glycol - and artificial fiber commonly called Miralax or Glycolax.  It is helpful for people with gassy or bloated feelings with regular fiber o Flax Seed - a less gassy fiber than psyllium  No reading or other relaxing activity while on the toilet. If bowel  movements take longer than 5 minutes, you are too constipated  AVOID CONSTIPATION.  High fiber and water intake usually takes care of this.  Sometimes a laxative is needed to stimulate more frequent bowel movements, but   Laxatives are not a good long-term solution as it can wear the colon out.  They can help jump-start bowels if constipated, but should be relied on constantly without discussing with your doctor o Osmotics (Milk of Magnesia, Fleets phosphosoda, Magnesium citrate, MiraLax, GoLytely) are safer than  o Stimulants (Senokot, Castor Oil, Dulcolax, Ex Lax)    o Avoid taking laxatives for more than 7 days in a row.   IF SEVERELY CONSTIPATED, try a Bowel Retraining Program: o Do not use laxatives.  o Eat a diet high in roughage, such as bran cereals and leafy vegetables.  o Drink six (6) ounces of prune or apricot juice each morning.  o Eat two (2) large servings of stewed fruit each day.  o Take one (1) heaping tablespoon of a psyllium-based bulking agent twice a day. Use sugar-free sweetener when possible to avoid excessive calories.  o Eat a normal breakfast.  o Set aside 15 minutes after breakfast to sit on the toilet, but do not strain to have a bowel movement.  o If you do not have a bowel movement by the third day, use an enema and repeat the above steps.  Controlling diarrhea o Switch to liquids and simpler foods for a few days to avoid stressing your intestines further. o Avoid dairy products (especially milk & ice cream) for a short time.  The intestines often can lose the ability to digest lactose when stressed. o Avoid foods that cause gassiness or bloating.  Typical foods include beans and other legumes, cabbage, broccoli, and dairy foods.  Every person has some sensitivity to other foods, so listen to our body and avoid those foods that trigger problems for you. o Adding fiber (Citrucel, Metamucil, psyllium, Miralax) gradually can help thicken stools by absorbing excess  fluid and retrain the intestines to act more normally.  Slowly increase the dose over a few weeks.  Too much fiber too soon can backfire and cause cramping & bloating. o Probiotics (such as active yogurt, Align, etc) may help repopulate the intestines and colon with normal bacteria and calm down a sensitive digestive tract.  Most studies show it to be of mild help, though, and such products can be costly. o Medicines: - Bismuth subsalicylate (ex. Kayopectate, Pepto Bismol) every 30 minutes for up to 6 doses can help control diarrhea.  Avoid if pregnant. - Loperamide (Immodium) can slow down diarrhea.  Start with two tablets (4mg  total) first and then try one tablet every 6 hours.  Avoid if you are having fevers or severe pain.  If you are not better or start feeling worse, stop all medicines and call your doctor for advice o Call your doctor if you are getting worse or not better.  Sometimes further testing (cultures, endoscopy, X-ray studies, bloodwork, etc) may be needed to help diagnose and treat the cause of the diarrhea.  TROUBLESHOOTING IRREGULAR BOWELS 1) Avoid extremes of bowel movements (no bad constipation/diarrhea) 2) Miralax 17gm mixed in 8oz. water or juice-daily. May use BID as needed.  3) Gas-x,Phazyme, etc. as needed for gas & bloating.  4) Soft,bland diet. No spicy,greasy,fried foods.  5) Prilosec over-the-counter as needed  6) May hold gluten/wheat products from diet to see if symptoms improve.  7)  May try probiotics (Align, Activa, etc) to help calm the bowels down 7) If symptoms become worse call back immediately.  Managing Pain  Pain after surgery or related to activity is often due to strain/injury to muscle, tendon, nerves and/or incisions.  This pain is usually short-term and will improve in a few months.   Many people find it helpful to do the following things TOGETHER to help speed the process of healing and to get back to regular activity more quickly:  6. Avoid  heavy physical activity at first a. No lifting greater than 20 pounds at first, then increase to lifting as tolerated over the next few weeks b. Do not push through the pain.  Listen to your body and avoid positions and maneuvers than reproduce the pain.  Wait a few days before trying something more intense c. Walking is okay as tolerated, but go slowly and stop when getting sore.  If you can walk 30 minutes without stopping or pain, you can try more intense activity (running, jogging, aerobics, cycling, swimming, treadmill, sex, sports, weightlifting, etc ) d. Remember: If it hurts to do it, then dont do it!  7. Take Acetaminophen Anti-inflammatory medication i. Acetaminophen 500mg  tabs (Tylenol) 1-2 pills with every meal and just before bedtime (avoid if you have liver problems) ii. Take with food/snack around the clock for 1-2 weeks iii. This helps the muscle and nerve tissues become less  irritable and calm down faster  8. Use a Heating pad or Ice/Cold Pack a. 4-6 times a day b. May use warm bath/hottub  or showers  9. Try Gentle Massage and/or Stretching  a. at the area of pain many times a day b. stop if you feel pain - do not overdo it  Try these steps together to help you body heal faster and avoid making things get worse.  Doing just one of these things may not be enough.    If you are not getting better after two weeks or are noticing you are getting worse, contact our office for further advice; we may need to re-evaluate you & see what other things we can do to help.  Colorectal Cancer Colorectal cancer is an abnormal growth of tissue (tumor) in the colon or rectum that is cancerous (malignant). Unlike noncancerous (benign) tumors, malignant tumors can spread to other parts of your body. The colon is the large bowel or large intestine. The rectum is the last several inches of the colon.  RISK FACTORS The exact cause of colorectal cancer is unknown. However, the following  factors may increase your chances of getting colorectal cancer:   Age older than 69 years.   Abnormal growths (polyps) on the inner wall of the colon or rectum.   Diabetes.   African American race.   Family history of hereditary nonpolyposis colorectal cancer. This condition is caused by changes in the genes that are responsible for repairing mismatched DNA.   Personal history of cancer. A person who has already had colorectal cancer may develop it a second time. Also, women with a history of ovarian, uterine, or breast cancer are at a somewhat higher risk of developing colorectal cancer.  Certain hereditary conditions.  Eating a diet that is high in fat (especially animal fat) and low in fiber, fruits, and vegetables.  Sedentary lifestyle.  Inflammatory bowel disease, including ulcerative colitis and Crohn's disease.   Smoking.   Excessive alcohol use.  SYMPTOMS Early colorectal cancer often does not cause symptoms. As the cancer grows, symptoms may include:   Changes in bowel habits.  Diarrhea.   Constipation.   Feeling like the bowel does not empty completely after a bowel movement.   Blood in the stool.   Stools that are narrower than usual.   Abdominal discomfort, pain, bloating, fullness, or cramps.  Frequent gas pain.   Unexplained weight loss.   Constant tiredness.   Nausea and vomiting.  DIAGNOSIS  Your health care provider will ask about your medical history. He or she may also perform a number of procedures, such as:   A physical exam.  A digital rectal exam.  A fecal occult blood test.  A barium enema.  Blood tests.   X-rays.   Imaging tests, such as CT scans or MRIs.   Taking a tissue sample (biopsy) from your colon or rectum to look for cancer cells.   A sigmoidoscopy to view the inside of the last part of your colon.   A colonoscopy to view the inside of your entire colon.   An endorectal ultrasound to see  how deep a rectal tumor has grown and whether the cancer has spread to lymph nodes or other nearby tissues.  Your cancer will be staged to determine its severity and extent. Staging is a careful attempt to find out the size of the tumor, whether the cancer has spread, and if so, to what parts of the body. You may need  to have more tests to determine the stage of your cancer. The test results will help determine what treatment plan is best for you.   Stage 0. The cancer is found only in the innermost lining of the colon or rectum.   Stage I. The cancer has grown into the inner wall of the colon or rectum. The cancer has not yet reached the outer wall of the colon.   Stage II. The cancer extends more deeply into or through the wall of the colon or rectum. It may have invaded nearby tissue, but cancer cells have not spread to the lymph nodes.   Stage III. The cancer has spread to nearby lymph nodes but not to other parts of the body.   Stage IV. The cancer has spread to other parts of the body, such as the liver or lungs.  Your health care provider may tell you the detailed stage of your cancer, which includes both a number and a letter.  TREATMENT  Depending on the type and stage, colorectal cancer may be treated with surgery, radiation therapy, chemotherapy, targeted therapy, or radiofrequency ablation. Some people have a combination of these therapies. Surgery may be done to remove the polyps from your colon. In early stages, your health care provider may be able to do this during a colonoscopy. In later stages, surgery may be done to remove part of your colon.  HOME CARE INSTRUCTIONS   Take medicines only as directed by your health care provider.   Maintain a healthy diet.   Consider joining a support group. This may help you learn to cope with the stress of having colorectal cancer.   Seek advice to help you manage treatment of side effects.   Keep all follow-up visits as  directed by your health care provider.   Inform your cancer specialist if you are admitted to the hospital.  SEEK MEDICAL CARE IF:  Your diarrhea or constipation does not go away.   Your bowel habits change.  You have increased abdominal pain.   You notice new fatigue or weakness.  You lose weight.   This information is not intended to replace advice given to you by your health care provider. Make sure you discuss any questions you have with your health care provider.   Document Released: 01/15/2005 Document Revised: 02/05/2014 Document Reviewed: 07/10/2012 Elsevier Interactive Patient Education [employer].   Exercising to [employer] Exercising regularly is important. It has many health benefits, such as:  Improving your overall fitness, flexibility, and endurance.  Increasing your bone density.  Helping with weight control.  Decreasing your body fat.  Increasing your muscle strength.  Reducing stress and tension.  Improving your overall health. In order to become healthy and stay healthy, it is recommended that you do moderate-intensity and vigorous-intensity exercise. You can tell that you are exercising at a moderate intensity if you have a higher heart rate and faster breathing, but you are still able to hold a conversation. You can tell that you are exercising at a vigorous intensity if you are breathing much harder and faster and cannot hold a conversation while exercising. HOW OFTEN SHOULD I EXERCISE? Choose an activity that you enjoy and set realistic goals. Your health care provider can help you to make an activity plan that works for you. Exercise regularly as directed by your health care provider. This may include:   Doing resistance training twice each week, such as:  Push-ups.  Sit-ups.  Lifting weights.  Using  resistance bands.  Doing a given intensity of exercise for a given amount of time. Choose from these options:  150 minutes of  moderate-intensity exercise every week.  75 minutes of vigorous-intensity exercise every week.  A mix of moderate-intensity and vigorous-intensity exercise every week. Children, pregnant women, people who are out of shape, people who are overweight, and older adults may need to consult a health care provider for individual recommendations. If you have any sort of medical condition, be sure to consult your health care provider before starting a new exercise program.  WHAT ARE SOME EXERCISE IDEAS? Some moderate-intensity exercise ideas include:   Walking at a rate of 1 mile in 15 minutes.  Biking.  Hiking.  Golfing.  Dancing. Some vigorous-intensity exercise ideas include:   Walking at a rate of at least 4.5 miles per hour.  Jogging or running at a rate of 5 miles per hour.  Biking at a rate of at least 10 miles per hour.  Lap swimming.  Roller-skating or in-line skating.  Cross-country skiing.  Vigorous competitive sports, such as football, basketball, and soccer.  Jumping rope.  Aerobic dancing. WHAT ARE SOME EVERYDAY ACTIVITIES THAT CAN HELP ME TO GET EXERCISE?  Yard work, such as:  Psychologist, educational.  Raking and bagging leaves.  Washing and waxing your car.  Pushing a stroller.  Shoveling snow.  Gardening.  Washing windows or floors. HOW CAN I BE MORE ACTIVE IN MY DAY-TO-DAY ACTIVITIES?  Use the stairs instead of the elevator.  Take a walk during your lunch break.  If you drive, park your car farther away from work or school.  If you take public transportation, get off one stop early and walk the rest of the way.  Make all of your phone calls while standing up and walking around.  Get up, stretch, and walk around every 30 minutes throughout the day. WHAT GUIDELINES SHOULD I FOLLOW WHILE EXERCISING?  Do not exercise so much that you hurt yourself, feel dizzy, or get very short of breath.  Consult your health care provider before starting a  new exercise program.  Wear comfortable clothes and shoes with good support.  Drink plenty of water while you exercise to prevent dehydration or heat stroke. Body water is lost during exercise and must be replaced.  Work out until you breathe faster and your heart beats faster.   This information is not intended to replace advice given to you by your health care provider. Make sure you discuss any questions you have with your health care provider.   Document Released: 02/17/2010 Document Revised: 02/05/2014 Document Reviewed: 06/18/2013 Elsevier Interactive Patient Education [employer].

## 2014-11-12 NOTE — Evaluation (Signed)
Physical Therapy Evaluation Patient Details Name: Samantha [name] MRN: [id number] DOB: 1940/12/22 Today's Date: 11/12/2014   History of Present Illness  74 y.o. female with h/o HTN, fibromyalgia, HTN, DM, CHF admitted for laproscopic partial colectomy 2* colorectal cancer.   Clinical Impression  Pt admitted with above diagnosis. Pt currently with functional limitations due to the deficits listed below (see PT Problem List). Pt reported "11/10" abdominal pain. She had walked in hallway earlier this morning but stated her pain medication has now worn off. She was only able to tolerate bed to recliner transfer, pain medication requested. Good progress expected.  Pt will benefit from skilled PT to increase their independence and safety with mobility to allow discharge to the venue listed below.       Follow Up Recommendations Home health PT    Equipment Recommendations  3in1 (PT);Other (comment) (shower chair with back)    Recommendations for Other Services       Precautions / Restrictions Precautions Precaution Comments: abdominal surgery      Mobility  Bed Mobility Overal bed mobility: Needs Assistance Bed Mobility: Supine to Sit     Supine to sit: HOB elevated     General bed mobility comments: min A to raise trunk  Transfers Overall transfer level: Needs assistance Equipment used: Rolling walker (2 wheeled) Transfers: Sit to/from Stand Sit to Stand: Min assist         General transfer comment: min A to rise  Ambulation/Gait Ambulation/Gait assistance: Min guard Ambulation Distance (Feet): 3 Feet Assistive device: Rolling walker (2 wheeled) Gait Pattern/deviations: Step-to pattern   Gait velocity interpretation: Below normal speed for age/gender General Gait Details: distance limited by pain, pt stated she'd walked into hallway with RW earlier this morning with nursing  Stairs            Wheelchair Mobility    Modified Rankin (Stroke Patients  Only)       Balance Overall balance assessment: Modified Independent                                           Pertinent Vitals/Pain Pain Assessment: 0-10 Pain Score: 10-Worst pain ever Pain Location: abdomen Pain Descriptors / Indicators: Sore Pain Intervention(s): Limited activity within patient's tolerance;Monitored during session;Patient requesting pain meds-RN notified    Home Living Family/patient expects to be discharged to:: Private residence Living Arrangements: Spouse/significant other;Children Available Help at Discharge: Family;Available 24 hours/day Type of Home: House Home Access: Ramped entrance     Home Layout: One level Home Equipment: Cane - single point;Walker - 2 wheels      Prior Function Level of Independence: Independent with assistive device(s)         Comments: uses cane     Hand Dominance        Extremity/Trunk Assessment   Upper Extremity Assessment: Overall WFL for tasks assessed           Lower Extremity Assessment: Overall WFL for tasks assessed      Cervical / Trunk Assessment: Normal  Communication   Communication: No difficulties  Cognition Arousal/Alertness: Awake/alert Behavior During Therapy: WFL for tasks assessed/performed Overall Cognitive Status: Within Functional Limits for tasks assessed                      General Comments      Exercises  Assessment/Plan    PT Assessment Patient needs continued PT services  PT Diagnosis Difficulty walking;Acute pain   PT Problem List Decreased activity tolerance;Pain;Decreased mobility  PT Treatment Interventions Gait training;Functional mobility training;Therapeutic activities;Patient/family education;Therapeutic exercise   PT Goals (Current goals can be found in the Care Plan section) Acute Rehab PT Goals Patient Stated Goal: to walk PT Goal Formulation: With patient Time For Goal Achievement: 11/26/14 Potential to Achieve  Goals: Good    Frequency Min 3X/week   Barriers to discharge        Co-evaluation               End of Session   Activity Tolerance: Patient limited by pain Patient left: in chair;with call bell/phone within reach;with nursing/sitter in room Nurse Communication: Mobility status         Time: 1130-1148 PT Time Calculation (min) (ACUTE ONLY): 18 min   Charges:   PT Evaluation $Initial PT Evaluation Tier I: 1 Procedure     PT G CodesPhilomena Doheny 11/12/2014, 12:37 PM [phone]

## 2014-11-12 NOTE — Progress Notes (Signed)
1 Day Post-Op Robotic assisted R colectomy Subjective: Doing well, having some nausea with pain meds.  Pain controlled  Objective: Vital signs in last 24 hours: Temp:  [97.5 F (36.4 C)-98.3 F (36.8 C)] 98.2 F (36.8 C) (10/14 0519) Pulse Rate:  [71-87] 80 (10/14 0519) Resp:  [11-18] 18 (10/14 0519) BP: (120-169)/(63-85) 120/67 mmHg (10/14 0519) SpO2:  [97 %-100 %] 100 % (10/14 0519)   Intake/Output from previous day: 10/13 0701 - 10/14 0700 In: 2641.3 [I.V.:2641.3] Out: 1345 [Urine:995; Emesis/NG output:300; Blood:50] Intake/Output this shift:     General appearance: alert and cooperative GI: normal findings: soft, non-tender and non-distended  Incision: healing well, no significant drainage  Lab Results:   Recent Labs  11/12/14 0535  WBC 7.9  HGB 11.2*  HCT 33.7*  PLT 140*   BMET  Recent Labs  11/12/14 0535  NA 136  K 4.6  CL 108  CO2 24  GLUCOSE 254*  BUN 11  CREATININE 0.75  CALCIUM 8.1*   PT/INR No results for input(s): LABPROT, INR in the last 72 hours. ABG No results for input(s): PHART, HCO3 in the last 72 hours.  Invalid input(s): PCO2, PO2  MEDS, Scheduled . acetaminophen  1,000 mg Oral 4 times per day  . alvimopan  12 mg Oral BID  . [START ON 11/14/2014] clopidogrel  75 mg Oral Q breakfast  . DULoxetine  60 mg Oral Daily  . enoxaparin (LOVENOX) injection  40 mg Subcutaneous Q24H  . famotidine (PEPCID) IV  20 mg Intravenous Q12H  . furosemide  20 mg Oral BID  . hydrALAZINE  25 mg Oral TID  . insulin aspart  0-20 Units Subcutaneous TID WC  . insulin aspart  0-5 Units Subcutaneous QHS  . lisinopril  40 mg Oral Daily  . [START ON 11/13/2014] metFORMIN  500 mg Oral Q breakfast  . nebivolol  10 mg Oral Daily  . potassium gluconate  595 mg Oral Daily    Studies/Results: No results found.  Assessment: s/p Procedure(s): XI ROBOT ASSISTED LAPAROSCOPIC PARTIAL COLECTOMY Patient Active Problem List   Diagnosis Date Noted  . Primary  colon cancer ([place]) 11/11/2014  . PCP NOTES >>> 11/02/2014  . Pain in the chest   . Chest pain 04/21/2014  . Migraine (Ocular) 01/05/2014  . Chronic diastolic CHF (congestive heart failure), NYHA class 2 ([place]) 10/22/2013  . Gait difficulty 10/06/2013  . Pain in joint, shoulder region 11/27/2012  . VBI (vertebrobasilar insufficiency) 08/22/2012  . Right knee pain 07/29/2012  . TIA (transient ischemic attack) 06/27/2012  . Edema 02/01/2012  . DJD (degenerative joint disease) 02/02/2011  . Annual physical exam 06/06/2010  . 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 Postherpetic neuralgia ? 01/02/2010  . NECK PAIN 10/05/2009  . DYSPNEA ON EXERTION 12/06/2008  . Palpitations 11/23/2008  . UTI'S, RECURRENT 09/28/2008  . Fibromyalgia 08/15/2007  . FATIGUE 11/19/2006  . 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 HYPERLIPIDEMIA 05/21/2006  . HTN (hypertension) 05/21/2006  . Reactive airway disease 01/29/2002    Doing as expected  Plan: Advance diet to clears Minimize IVF's Ambulate  Foley out tomorrow Plavix to restart Sun as long as Hgb stable   LOS: 1 day     .Rosario Adie, [facility], [place]   11/12/2014 8:36 AM

## 2014-11-13 LAB — BASIC METABOLIC PANEL
ANION GAP: 6 (ref 5–15)
BUN: 7 mg/dL (ref 6–20)
CHLORIDE: 103 mmol/L (ref 101–111)
CO2: 25 mmol/L (ref 22–32)
Calcium: 8.4 mg/dL — ABNORMAL LOW (ref 8.9–10.3)
Creatinine, Ser: 0.77 mg/dL (ref 0.44–1.00)
GFR calc non Af Amer: >60 mL/min (ref >60)
GLUCOSE: 116 mg/dL — AB (ref 65–99)
POTASSIUM: 4 mmol/L (ref 3.5–5.1)
Sodium: 134 mmol/L — ABNORMAL LOW (ref 135–145)

## 2014-11-13 LAB — CBC
HEMATOCRIT: 35.4 % — AB (ref 36.0–46.0)
HEMOGLOBIN: 11.6 g/dL — AB (ref 12.0–15.0)
MCH: 28.6 pg (ref 26.0–34.0)
MCHC: 32.8 g/dL (ref 30.0–36.0)
MCV: 87.4 fL (ref 78.0–100.0)
Platelets: 131 10*3/uL — ABNORMAL LOW (ref 150–400)
RBC: 4.05 MIL/uL (ref 3.87–5.11)
RDW: 14.9 % (ref 11.5–15.5)
WBC: 6.1 10*3/uL (ref 4.0–10.5)

## 2014-11-13 LAB — GLUCOSE, CAPILLARY
GLUCOSE-CAPILLARY: 127 mg/dL — AB (ref 65–99)
GLUCOSE-CAPILLARY: 110 mg/dL — AB (ref 65–99)
Glucose-Capillary: 111 mg/dL — ABNORMAL HIGH (ref 65–99)
Glucose-Capillary: 108 mg/dL — ABNORMAL HIGH (ref 65–99)

## 2014-11-13 MED ORDER — ONDANSETRON HCL 4 MG PO TABS: 4.0000 mg | ORAL_TABLET | Freq: Four times a day (QID) | ORAL | Status: DC | PRN

## 2014-11-13 MED ORDER — ONDANSETRON HCL 4 MG/2ML IJ SOLN
4.0000 mg | INTRAMUSCULAR | Status: DC | PRN
  Administered 2014-11-13: 4 mg via INTRAVENOUS
  Filled 2014-11-13: qty 2

## 2014-11-13 NOTE — Progress Notes (Signed)
2 Days Post-Op  Subjective: Having nausea and vomiting after clear liquid breakfast.  Objective: Vital signs in last 24 hours: Temp:  [98 F (36.7 C)-98.2 F (36.8 C)] 98.1 F (36.7 C) (10/15 0602) Pulse Rate:  [59-65] 65 (10/15 0602) Resp:  [18] 18 (10/15 0602) BP: (111-121)/(55-64) 114/64 mmHg (10/15 1010) SpO2:  [94 %-100 %] 94 % (10/15 0602) Last BM Date: 11/10/14  Intake/Output from previous day: 10/14 0701 - 10/15 0700 In: 2023.9 [P.O.:1080; I.V.:893.9; IV Piggyback:50] Out: 3100 [Urine:3100] Intake/Output this shift:    PE: General- In NAD Abdomen-soft, quiet, dressing dry.  Lab Results:   Recent Labs  11/12/14 0535 11/13/14 0528  WBC 7.9 6.1  HGB 11.2* 11.6*  HCT 33.7* 35.4*  PLT 140* 131*   BMET  Recent Labs  11/12/14 0535 11/13/14 0528  NA 136 134*  K 4.6 4.0  CL 108 103  CO2 24 25  GLUCOSE 254* 116*  BUN 11 7  CREATININE 0.75 0.77  CALCIUM 8.1* 8.4*   PT/INR No results for input(s): LABPROT, INR in the last 72 hours. Comprehensive Metabolic Panel:    Component Value Date/Time   NA 134* 11/13/2014 0528   NA 136 11/12/2014 0535   K 4.0 11/13/2014 0528   K 4.6 11/12/2014 0535   CL 103 11/13/2014 0528   CL 108 11/12/2014 0535   CO2 25 11/13/2014 0528   CO2 24 11/12/2014 0535   BUN 7 11/13/2014 0528   BUN 11 11/12/2014 0535   CREATININE 0.77 11/13/2014 0528   CREATININE 0.75 11/12/2014 0535   CREATININE 1.11* 08/29/2011 1640   CREATININE 0.82 08/14/2011 1102   GLUCOSE 116* 11/13/2014 0528   GLUCOSE 254* 11/12/2014 0535   CALCIUM 8.4* 11/13/2014 0528   CALCIUM 8.1* 11/12/2014 0535   AST 19 10/20/2014 0456   AST 22 12/03/2013 2235   ALT 17 10/20/2014 0456   ALT 20 12/03/2013 2235   ALKPHOS 47 10/20/2014 0456   ALKPHOS 63 12/03/2013 2235   BILITOT 0.5 10/20/2014 0456   BILITOT 0.3 12/03/2013 2235   PROT 6.5 10/20/2014 0456   PROT 7.2 12/03/2013 2235   ALBUMIN 3.5 10/20/2014 0456   ALBUMIN 3.8 12/03/2013 2235      Studies/Results: No results found.  Anti-infectives: Anti-infectives    Start     Dose/Rate Route Frequency Ordered Stop   11/11/14 1600  clindamycin (CLEOCIN) IVPB 900 mg     900 mg 100 mL/hr over 30 Minutes Intravenous 3 times per day 11/11/14 1335 11/11/14 1639   11/11/14 0000  gentamicin (GARAMYCIN) 320 mg, clindamycin (CLEOCIN) 900 mg in dextrose 5 % 100 mL IVPB     228 mL/hr over 30 Minutes Intravenous 60 min pre-op 11/10/14 1623 11/11/14 0750      Assessment Active Problems: Right colon cancer s/p right colectomy-nausea and vomiting this AM.  DM II-cbgs 111-126 on SSI  HTN-BP wnl on home meds    LOS: 2 days   Plan: Decrease diet to ice chips.   Dagmawi Venable J 11/13/2014

## 2014-11-14 LAB — GLUCOSE, CAPILLARY
GLUCOSE-CAPILLARY: 105 mg/dL — AB (ref 65–99)
GLUCOSE-CAPILLARY: 100 mg/dL — AB (ref 65–99)
Glucose-Capillary: 105 mg/dL — ABNORMAL HIGH (ref 65–99)
Glucose-Capillary: 111 mg/dL — ABNORMAL HIGH (ref 65–99)

## 2014-11-14 LAB — BASIC METABOLIC PANEL
ANION GAP: 7 (ref 5–15)
BUN: 8 mg/dL (ref 6–20)
CO2: 29 mmol/L (ref 22–32)
Calcium: 8.7 mg/dL — ABNORMAL LOW (ref 8.9–10.3)
Chloride: 102 mmol/L (ref 101–111)
Creatinine, Ser: 0.92 mg/dL (ref 0.44–1.00)
GFR calc Af Amer: >60 mL/min (ref >60)
GFR calc non Af Amer: >60 mL/min (ref >60)
GLUCOSE: 124 mg/dL — AB (ref 65–99)
POTASSIUM: 3.9 mmol/L (ref 3.5–5.1)
Sodium: 138 mmol/L (ref 135–145)

## 2014-11-14 LAB — CBC
HEMATOCRIT: 34.5 % — AB (ref 36.0–46.0)
HEMOGLOBIN: 11.5 g/dL — AB (ref 12.0–15.0)
MCH: 28.8 pg (ref 26.0–34.0)
MCHC: 33.3 g/dL (ref 30.0–36.0)
MCV: 86.3 fL (ref 78.0–100.0)
Platelets: 155 10*3/uL (ref 150–400)
RBC: 4 MIL/uL (ref 3.87–5.11)
RDW: 14.6 % (ref 11.5–15.5)
WBC: 4.2 10*3/uL (ref 4.0–10.5)

## 2014-11-14 NOTE — Progress Notes (Signed)
3 Days Post-Op  Subjective: Feels much better this AM.  Passing gas.  No n/v.  Objective: Vital signs in last 24 hours: Temp:  [98.3 F (36.8 C)-98.5 F (36.9 C)] 98.5 F (36.9 C) (10/16 0538) Pulse Rate:  [61-66] 66 (10/16 0538) Resp:  [16-18] 16 (10/16 0538) BP: (100-142)/(50-74) 100/50 mmHg (10/16 0538) SpO2:  [96 %-100 %] 96 % (10/16 0538) Last BM Date: 11/10/14  Intake/Output from previous day: 10/15 0701 - 10/16 0700 In: 1220 [I.V.:1120; IV Piggyback:100] Out: 1900 [Urine:1600; Emesis/NG output:300] Intake/Output this shift:    PE: General- In NAD Abdomen-soft, quiet, incisions are clean and intact  Lab Results:   Recent Labs  11/13/14 0528 11/14/14 0450  WBC 6.1 4.2  HGB 11.6* 11.5*  HCT 35.4* 34.5*  PLT 131* 155   BMET  Recent Labs  11/13/14 0528 11/14/14 0450  NA 134* 138  K 4.0 3.9  CL 103 102  CO2 25 29  GLUCOSE 116* 124*  BUN 7 8  CREATININE 0.77 0.92  CALCIUM 8.4* 8.7*   PT/INR No results for input(s): LABPROT, INR in the last 72 hours. Comprehensive Metabolic Panel:    Component Value Date/Time   NA 138 11/14/2014 0450   NA 134* 11/13/2014 0528   K 3.9 11/14/2014 0450   K 4.0 11/13/2014 0528   CL 102 11/14/2014 0450   CL 103 11/13/2014 0528   CO2 29 11/14/2014 0450   CO2 25 11/13/2014 0528   BUN 8 11/14/2014 0450   BUN 7 11/13/2014 0528   CREATININE 0.92 11/14/2014 0450   CREATININE 0.77 11/13/2014 0528   CREATININE 1.11* 08/29/2011 1640   CREATININE 0.82 08/14/2011 1102   GLUCOSE 124* 11/14/2014 0450   GLUCOSE 116* 11/13/2014 0528   CALCIUM 8.7* 11/14/2014 0450   CALCIUM 8.4* 11/13/2014 0528   AST 19 10/20/2014 0456   AST 22 12/03/2013 2235   ALT 17 10/20/2014 0456   ALT 20 12/03/2013 2235   ALKPHOS 47 10/20/2014 0456   ALKPHOS 63 12/03/2013 2235   BILITOT 0.5 10/20/2014 0456   BILITOT 0.3 12/03/2013 2235   PROT 6.5 10/20/2014 0456   PROT 7.2 12/03/2013 2235   ALBUMIN 3.5 10/20/2014 0456   ALBUMIN 3.8 12/03/2013  2235     Studies/Results: No results found.  Anti-infectives: Anti-infectives    Start     Dose/Rate Route Frequency Ordered Stop   11/11/14 1600  clindamycin (CLEOCIN) IVPB 900 mg     900 mg 100 mL/hr over 30 Minutes Intravenous 3 times per day 11/11/14 1335 11/11/14 1639   11/11/14 0000  gentamicin (GARAMYCIN) 320 mg, clindamycin (CLEOCIN) 900 mg in dextrose 5 % 100 mL IVPB     228 mL/hr over 30 Minutes Intravenous 60 min pre-op 11/10/14 1623 11/11/14 0750      Assessment Active Problems: Right colon cancer s/p right colectomy-better today; bowel function starting to return.  DM II-cbgs 105-127 on SSI  HTN-BP wnl on home meds    LOS: 3 days   Plan: Try clear liquids again.   May Ozment J 11/14/2014

## 2014-11-15 ENCOUNTER — Encounter (HOSPITAL_COMMUNITY): Payer: Self-pay | Admitting: Surgery

## 2014-11-15 LAB — GLUCOSE, CAPILLARY
GLUCOSE-CAPILLARY: 113 mg/dL — AB (ref 65–99)
GLUCOSE-CAPILLARY: 106 mg/dL — AB (ref 65–99)
GLUCOSE-CAPILLARY: 87 mg/dL (ref 65–99)
GLUCOSE-CAPILLARY: 122 mg/dL — AB (ref 65–99)

## 2014-11-15 MED ORDER — VITAMIN D3 25 MCG (1000 UNIT) PO TABS
1000.0000 [IU] | ORAL_TABLET | Freq: Every morning | ORAL | Status: DC
  Administered 2014-11-15 – 2014-11-16 (×2): 1000 [IU] via ORAL
  Filled 2014-11-15 (×2): qty 1

## 2014-11-15 MED ORDER — PREGABALIN 50 MG PO CAPS: 100.0000 mg | ORAL_CAPSULE | Freq: Every evening | ORAL | Status: DC | PRN

## 2014-11-15 MED ORDER — OSTEO BI-FLEX JOINT SHIELD PO TABS
1.0000 | ORAL_TABLET | Freq: Every day | ORAL | Status: DC
Start: 2014-11-15 — End: 2014-11-15

## 2014-11-15 MED ORDER — SODIUM CHLORIDE 0.9 % IJ SOLN
3.0000 mL | Freq: Two times a day (BID) | INTRAMUSCULAR | Status: DC
  Administered 2014-11-15 (×2): 3 mL via INTRAVENOUS

## 2014-11-15 MED ORDER — ADULT MULTIVITAMIN W/MINERALS CH
1.0000 | ORAL_TABLET | Freq: Every day | ORAL | Status: DC
  Administered 2014-11-15 – 2014-11-16 (×2): 1 via ORAL
  Filled 2014-11-15 (×2): qty 1

## 2014-11-15 MED ORDER — MULTIVITAMINS PO CAPS: 1.0000 | ORAL_CAPSULE | Freq: Every day | ORAL | Status: DC

## 2014-11-15 MED ORDER — ONDANSETRON 4 MG PO TBDP: 4.0000 mg | ORAL_TABLET | Freq: Three times a day (TID) | ORAL | Status: DC | PRN

## 2014-11-15 MED ORDER — FAMOTIDINE 20 MG PO TABS
20.0000 mg | ORAL_TABLET | Freq: Two times a day (BID) | ORAL | Status: DC
  Administered 2014-11-15 – 2014-11-16 (×3): 20 mg via ORAL
  Filled 2014-11-15 (×4): qty 1

## 2014-11-15 MED ORDER — PHENOL 1.4 % MT LIQD
2.0000 | OROMUCOSAL | Status: DC | PRN
  Filled 2014-11-15: qty 177

## 2014-11-15 MED ORDER — SODIUM CHLORIDE 0.9 % IJ SOLN: 3.0000 mL | INTRAMUSCULAR | Status: DC | PRN

## 2014-11-15 MED ORDER — LACTATED RINGERS IV BOLUS (SEPSIS): 1000.0000 mL | Freq: Three times a day (TID) | INTRAVENOUS | Status: DC | PRN

## 2014-11-15 MED ORDER — LORATADINE 10 MG PO TABS
10.0000 mg | ORAL_TABLET | Freq: Every day | ORAL | Status: DC
  Administered 2014-11-15 – 2014-11-16 (×2): 10 mg via ORAL
  Filled 2014-11-15 (×2): qty 1

## 2014-11-15 MED ORDER — LIP MEDEX EX OINT
1.0000 "application " | TOPICAL_OINTMENT | Freq: Two times a day (BID) | CUTANEOUS | Status: DC
  Administered 2014-11-15 – 2014-11-16 (×3): 1 via TOPICAL

## 2014-11-15 MED ORDER — ALUM & MAG HYDROXIDE-SIMETH 200-200-20 MG/5ML PO SUSP: 30.0000 mL | Freq: Four times a day (QID) | ORAL | Status: DC | PRN

## 2014-11-15 MED ORDER — MAGIC MOUTHWASH
15.0000 mL | Freq: Four times a day (QID) | ORAL | Status: DC | PRN
  Filled 2014-11-15: qty 15

## 2014-11-15 MED ORDER — ATORVASTATIN CALCIUM 10 MG PO TABS
10.0000 mg | ORAL_TABLET | Freq: Every day | ORAL | Status: DC
  Administered 2014-11-15: 10 mg via ORAL
  Filled 2014-11-15 (×2): qty 1

## 2014-11-15 MED ORDER — ACETAMINOPHEN 325 MG PO TABS: 325.0000 mg | ORAL_TABLET | Freq: Four times a day (QID) | ORAL | Status: DC | PRN

## 2014-11-15 MED ORDER — SODIUM CHLORIDE 0.9 % IV SOLN: 250.0000 mL | INTRAVENOUS | Status: DC | PRN

## 2014-11-15 NOTE — Care Management Important Message (Signed)
Important Message  Patient Details  Name: Samantha [name] MRN: [id number] Date of Birth: 24-Aug-1940   Medicare Important Message Given:  [facility] notification given    Camillo Flaming 11/15/2014, 12:57 [place] [facility] Message  Patient Details  Name: Samantha [name] MRN: [id number] Date of Birth: 1940-12-05   Medicare Important Message Given:  Yes-second notification given    Camillo Flaming 11/15/2014, 12:57 PM

## 2014-11-15 NOTE — Progress Notes (Signed)
Physical Therapy Treatment Patient Details Name: Samantha [name] MRN: [id number] DOB: [birth year] Today's Date: 11/15/2014    History of Present Illness 74 y.o. female with h/o HTN, fibromyalgia, HTN, DM, CHF admitted for laproscopic partial colectomy 2* colorectal cancer.     PT Comments    Progressing well with mobility. Encouraged pt to continue walking hallways with nursing. Will continue to assess/follow. Recommending HHPT for now but pt may not need if she continues to progress.   Follow Up Recommendations  Home health PT     Equipment Recommendations  3in1; shower chair with back   Recommendations for Other Services       Precautions / Restrictions Precautions Precautions: Fall Precaution Comments: abdominal surgery Restrictions Weight Bearing Restrictions: No    Mobility  Bed Mobility               General bed mobility comments: oob in recliner  Transfers Overall transfer level: Needs assistance Equipment used: Rolling walker (2 wheeled) Transfers: Sit to/from Stand Sit to Stand: Supervision         General transfer comment: for safety. Increased time.   Ambulation/Gait Ambulation/Gait assistance: Supervision Ambulation Distance (Feet): 500 Feet Assistive device: Rolling walker (2 wheeled) Gait Pattern/deviations: Step-through pattern     General Gait Details: for safety.    Stairs            Wheelchair Mobility    Modified Rankin (Stroke Patients Only)       Balance                                    Cognition Arousal/Alertness: Awake/alert Behavior During Therapy: WFL for tasks assessed/performed Overall Cognitive Status: Within Functional Limits for tasks assessed                      Exercises      General Comments        Pertinent Vitals/Pain Pain Assessment: 0-10 Pain Score: 6  Pain Location: abdomen Pain Descriptors / Indicators: Sore Pain Intervention(s): Monitored during session     Home Living                      Prior Function            PT Goals (current goals can now be found in the care plan section) Progress towards PT goals: Progressing toward goals    Frequency  Min 3X/week    PT Plan Current plan remains appropriate    Co-evaluation             End of Session   Activity Tolerance: Patient tolerated treatment well Patient left: in chair;with call bell/phone within reach     Time: 5993-5701 PT Time Calculation (min) (ACUTE ONLY): 17 min  Charges:  $Gait Training: 8-22 mins                    G Codes:      Weston Anna, MPT Pager: [phone]

## 2014-11-15 NOTE — Progress Notes (Signed)
PHARMACIST - PHYSICIAN ORDER COMMUNICATION  CONCERNING: P&T Medication Policy on Herbal Medications  DESCRIPTION:  This patient's order for:  Samantha [name]  has been noted.  This product(s) is classified as an "herbal" or natural product. Due to a lack of definitive safety studies or FDA approval, nonstandard manufacturing practices, plus the potential risk of unknown drug-drug interactions while on inpatient medications, the Pharmacy and Therapeutics Committee does not permit the use of "herbal" or natural products of this type within [facility].   ACTION TAKEN: The pharmacy department is unable to verify this order at this time and your patient has been informed of this safety policy. Please reevaluate patient's clinical condition at discharge and address if the herbal or natural product(s) should be resumed at that time.  Thank you, 11/15/2014 7:40 AM

## 2014-11-15 NOTE — Progress Notes (Signed)
[place]  [address] [place], [place] [address] Phone: [phone] FAX: Kinder [phone] 01-17-1941   Problem List:   Active Problems:   Primary colon cancer (Foley)   4 Days Post-Op  11/11/2014  Procedure(s):  PRE-OPERATIVE DIAGNOSIS: ASCENDING COLON CANCER  POST-OPERATIVE DIAGNOSIS: ASCENDING COLON CANCER  PROCEDURE: XI ROBOT ASSISTED LAPAROSCOPIC PARTIAL COLECTOMY   Surgeon(s): [name], MD  Assessment  Recovering  Plan:  -f/u pathology -adv diet -HTN control -stable back on anticoag Plavix -f/u pathology -VTE prophylaxis- SCDs, etc -mobilize as tolerated to help recovery  D/C patient from hospital when patient meets criteria (anticipate in 1-2 day(s)):  Tolerating oral intake well Ambulating well Adequate pain control without IV medications Urinating  Having flatus Disposition planning in place   Adin Hector, M.D., F.A.C.S. Gastrointestinal and Minimally Invasive Surgery [facility] [address] [place], [place] [address] [phone] Main / Paging   11/15/2014  Subjective:  Tol liquids better Wants solids  Walked in hallways x 2  Objective:  Vital signs:  Filed Vitals:   11/14/14 1440 11/14/14 1604 11/14/14 2200 11/15/14 0550  BP: 122/64 126/70 109/57 108/55  Pulse: 61  63 60  Temp: 98.7 F (37.1 C)  98.5 F (36.9 C) 98.3 F (36.8 C)  TempSrc: Oral  Oral Oral  Resp: _0 Height:      Weight:      SpO2: 95%  97% 97%    Last BM Date: 11/14/14  Intake/Output   Yesterday:  10/16 0701 - 10/17 0700 In: 1660 [P.O.:600; I.V.:960; IV Piggyback:100] Out: 1200 [Urine:1200] This shift:  Total I/O In: 580 [I.V.:480; IV Piggyback:100] Out: 400 [Urine:400]  Bowel function:  Flatus: y  BM: y  Drain: n/a  Physical Exam:  General: Pt awake/alert/oriented x4 in no acute distress Eyes: PERRL,  normal EOM.  Sclera clear.  No icterus Neuro: CN II-XII intact w/o focal sensory/motor deficits. Lymph: No head/neck/groin lymphadenopathy Psych:  No delerium/psychosis/paranoia HENT: Normocephalic, Mucus membranes moist.  No thrush Neck: Supple, No tracheal deviation Chest: No chest wall pain w good excursion CV:  Pulses intact.  Regular rhythm MS: Normal AROM mjr joints.  No obvious deformity Abdomen: Soft.  Nondistended.  Mildly tender at robotic incisions only.  No evidence of peritonitis.  No incarcerated hernias. Ext:  SCDs BLE.  No mjr edema.  No cyanosis Skin: No petechiae / purpura  Results:   Labs: Results for orders placed or performed during the hospital encounter of 11/11/14 (from the past 48 hour(s))  Glucose, capillary     Status: Abnormal   Collection Time: 11/13/14  7:55 AM  Result Value Ref Range   Glucose-Capillary 111 (H) 65 - 99 mg/dL  Glucose, capillary     Status: Abnormal   Collection Time: 11/13/14  1:27 PM  Result Value Ref Range   Glucose-Capillary 127 (H) 65 - 99 mg/dL  Glucose, capillary     Status: Abnormal   Collection Time: 11/13/14  4:44 PM  Result Value Ref Range   Glucose-Capillary 110 (H) 65 - 99 mg/dL  Glucose, capillary     Status: Abnormal   Collection Time: 11/13/14  9:04 PM  Result Value Ref Range   Glucose-Capillary 108 (H) 65 - 99 mg/dL  Basic metabolic panel     Status: Abnormal   Collection Time: 11/14/14  4:50 AM  Result Value Ref Range   Sodium 138 135 -  145 mmol/L   Potassium 3.9 3.5 - 5.1 mmol/L   Chloride 102 101 - 111 mmol/L   CO2 29 22 - 32 mmol/L   Glucose, Bld 124 (H) 65 - 99 mg/dL   BUN 8 6 - 20 mg/dL   Creatinine, Ser 0.92 0.44 - 1.00 mg/dL   Calcium 8.7 (L) 8.9 - 10.3 mg/dL   GFR calc non Af Amer >60 >60 mL/min   GFR calc Af Amer >60 >60 mL/min    Comment: (NOTE) The eGFR has been calculated using the CKD EPI equation. This calculation has not been validated in all clinical situations. eGFR's persistently <60  mL/min signify possible Chronic Kidney Disease.    Anion gap 7 5 - 15  CBC     Status: Abnormal   Collection Time: 11/14/14  4:50 AM  Result Value Ref Range   WBC 4.2 4.0 - 10.5 K/uL   RBC 4.00 3.87 - 5.11 MIL/uL   Hemoglobin 11.5 (L) 12.0 - 15.0 g/dL   HCT 34.5 (L) 36.0 - 46.0 %   MCV 86.3 78.0 - 100.0 fL   MCH 28.8 26.0 - 34.0 pg   MCHC 33.3 30.0 - 36.0 g/dL   RDW 14.6 11.5 - 15.5 %   Platelets 155 150 - 400 K/uL  Glucose, capillary     Status: Abnormal   Collection Time: 11/14/14  7:48 AM  Result Value Ref Range   Glucose-Capillary 105 (H) 65 - 99 mg/dL  Glucose, capillary     Status: Abnormal   Collection Time: 11/14/14 12:11 PM  Result Value Ref Range   Glucose-Capillary 105 (H) 65 - 99 mg/dL  Glucose, capillary     Status: Abnormal   Collection Time: 11/14/14  5:41 PM  Result Value Ref Range   Glucose-Capillary 111 (H) 65 - 99 mg/dL  Glucose, capillary     Status: Abnormal   Collection Time: 11/14/14 10:06 PM  Result Value Ref Range   Glucose-Capillary 100 (H) 65 - 99 mg/dL    Imaging / Studies: No results found.  Medications / Allergies: per chart  Antibiotics: Anti-infectives    Start     Dose/Rate Route Frequency Ordered Stop   11/11/14 1600  clindamycin (CLEOCIN) IVPB 900 mg     900 mg 100 mL/hr over 30 Minutes Intravenous 3 times per day 11/11/14 1335 11/11/14 1639   11/11/14 0000  gentamicin (GARAMYCIN) 320 mg, clindamycin (CLEOCIN) 900 mg in dextrose 5 % 100 mL IVPB     228 mL/hr over 30 Minutes Intravenous 60 min pre-op 11/10/14 1623 11/11/14 0750        Note: Portions of this report may have been transcribed using voice recognition software. Every effort was made to ensure accuracy; however, inadvertent computerized transcription errors may be present.   Any transcriptional errors that result from this process are unintentional.     Adin Hector, M.D., F.A.C.S. Gastrointestinal and Minimally Invasive Surgery [facility] [address] [place], [place] [address] [phone] Main / Paging   11/15/2014  CARE TEAM:  PCP: Kathlene November, MD  Outpatient Care Team: Patient Care Team: Colon Branch, MD as PCP - General [name], MD as Consulting Physician (Ophthalmology) [name], MD as Consulting Physician (Cardiology) [name], MD as Consulting Physician (Obstetrics and Gynecology) [name], MD as Consulting Physician (General Surgery)  Inpatient Treatment Team: Treatment Team: Attending Provider: Michael Boston, MD; Technician: Jefm Bryant, NT; Registered Nurse: Claiborne Rigg, RN;  Technician: Leda Quail, NT; Technician: Parks Ranger, NT; Technician: Abbe Amsterdam, NT; Consulting Physician: Michael Boston, MD

## 2014-11-15 NOTE — Progress Notes (Signed)
4 Days Post-Op  Subjective: Tolerating clear liquids bowels moving.  Objective: Vital signs in last 24 hours: Temp:  [98.3 F (36.8 C)-98.7 F (37.1 C)] 98.3 F (36.8 C) (10/17 0550) Pulse Rate:  [60-66] 60 (10/17 0550) Resp:  [16-18] 18 (10/17 0550) BP: (108-126)/(55-70) 108/55 mmHg (10/17 0550) SpO2:  [95 %-97 %] 97 % (10/17 0550) Last BM Date: 11/14/14  Intake/Output from previous day: 10/16 0701 - 10/17 0700 In: 1660 [P.O.:600; I.V.:960; IV Piggyback:100] Out: 1200 [Urine:1200] Intake/Output this shift: Total I/O In: -  Out: 250 [Urine:250]  PE: General- In NAD Abdomen-soft,incisions are clean and intact  Lab Results:   Recent Labs  11/13/14 0528 11/14/14 0450  WBC 6.1 4.2  HGB 11.6* 11.5*  HCT 35.4* 34.5*  PLT 131* 155   BMET  Recent Labs  11/13/14 0528 11/14/14 0450  NA 134* 138  K 4.0 3.9  CL 103 102  CO2 25 29  GLUCOSE 116* 124*  BUN 7 8  CREATININE 0.77 0.92  CALCIUM 8.4* 8.7*   PT/INR No results for input(s): LABPROT, INR in the last 72 hours. Comprehensive Metabolic Panel:    Component Value Date/Time   NA 138 11/14/2014 0450   NA 134* 11/13/2014 0528   K 3.9 11/14/2014 0450   K 4.0 11/13/2014 0528   CL 102 11/14/2014 0450   CL 103 11/13/2014 0528   CO2 29 11/14/2014 0450   CO2 25 11/13/2014 0528   BUN 8 11/14/2014 0450   BUN 7 11/13/2014 0528   CREATININE 0.92 11/14/2014 0450   CREATININE 0.77 11/13/2014 0528   CREATININE 1.11* 08/29/2011 1640   CREATININE 0.82 08/14/2011 1102   GLUCOSE 124* 11/14/2014 0450   GLUCOSE 116* 11/13/2014 0528   CALCIUM 8.7* 11/14/2014 0450   CALCIUM 8.4* 11/13/2014 0528   AST 19 10/20/2014 0456   AST 22 12/03/2013 2235   ALT 17 10/20/2014 0456   ALT 20 12/03/2013 2235   ALKPHOS 47 10/20/2014 0456   ALKPHOS 63 12/03/2013 2235   BILITOT 0.5 10/20/2014 0456   BILITOT 0.3 12/03/2013 2235   PROT 6.5 10/20/2014 0456   PROT 7.2 12/03/2013 2235   ALBUMIN 3.5 10/20/2014 0456   ALBUMIN 3.8  12/03/2013 2235     Studies/Results: No results found.  Anti-infectives: Anti-infectives    Start     Dose/Rate Route Frequency Ordered Stop   11/11/14 1600  clindamycin (CLEOCIN) IVPB 900 mg     900 mg 100 mL/hr over 30 Minutes Intravenous 3 times per day 11/11/14 1335 11/11/14 1639   11/11/14 0000  gentamicin (GARAMYCIN) 320 mg, clindamycin (CLEOCIN) 900 mg in dextrose 5 % 100 mL IVPB     228 mL/hr over 30 Minutes Intravenous 60 min pre-op 11/10/14 1623 11/11/14 0750      Assessment Active Problems: Right colon cancer s/p right colectomy-continues to improve  DM II-cbgs 105-113 on SSI  HTN-BP wnl on home meds    LOS: 4 days   Plan: Carb modified solid diet.   Samantha [name] 11/15/2014

## 2014-11-16 LAB — GLUCOSE, CAPILLARY: GLUCOSE-CAPILLARY: 116 mg/dL — AB (ref 65–99)

## 2014-11-16 MED ORDER — HYDRALAZINE HCL 10 MG PO TABS
10.0000 mg | ORAL_TABLET | Freq: Three times a day (TID) | ORAL | Status: DC
  Administered 2014-11-16: 10 mg via ORAL
  Filled 2014-11-16 (×3): qty 1

## 2014-11-16 MED ORDER — HYDROCODONE-ACETAMINOPHEN 5-325 MG PO TABS: 1.0000 | ORAL_TABLET | Freq: Four times a day (QID) | ORAL | Status: DC | PRN

## 2014-11-16 MED ORDER — PROMETHAZINE HCL 12.5 MG PO TABS: 12.5000 mg | ORAL_TABLET | Freq: Four times a day (QID) | ORAL | Status: DC | PRN

## 2014-11-16 NOTE — Progress Notes (Signed)
Pt's vitals were WNL, tolerating diet and pain is under control with PO pain meds. Discussed discharge instructions with patient. Answered all questions and concerns. Discharged to home

## 2014-11-16 NOTE — Discharge Summary (Signed)
Physician Discharge Summary  Patient ID: Samantha [name] MRN: [id number] DOB/AGE: 1940/12/24 74 y.o.  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 11/11/2014 Active Problems:   HTN (hypertension)   Reactive airway disease   Fibromyalgia   DJD (degenerative joint disease)   Discharge Diagnoses:  Principal Problem:   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 - REACTIVE CHANGES CONSISTENT WITH POLYPECTOMY SITE. - SUBMUCOSAL LIPOMA. - BENIGN APPENDIX. - RESECTION MARGINS ARE NEGATIVE FOR DYSPLASIA OR MALIGNANCY. - SIXTEEN OF SIXTEEN LYMPH NODES NEGATIVE FOR CARCINOMA (0/16). - SEE ONCOLOGY TABLE. Microscopic Comment Samantha AND RECTUM (INCLUDING TRANS-ANAL RESECTION): Specimen: Right Samantha, ileum and appendix. Procedure: Segmental resection. Tumor site: Ileocecal valve. Specimen integrity: Intact. Macroscopic tumor perforation: Not identified. Invasive tumor: No residual tumor in resection specimen. Details as appropriate are from polypectomy specimen ([id number]). Maximum size: Approximately 0.4 cm. Histologic  type(s): Invasive adenocarcinoma. Histologic grade and differentiation: G2: moderately differentiated/low grade Type of polyp in which invasive carcinoma arose: N/A. Microscopic extension of invasive tumor: Invades submucosa. Lymph-Vascular invasion: Not identified. Peri-neural invasion: Not identified. Tumor deposit(s) (discontinuous extramural extension): Not identified. Resection margins: From resection specimen. Proximal margin: Negative. Distal margin: Negative. Circumferential (radial) (posterior ascending, posterior descending; lateral 1 of 3 FINAL for Samantha [name] [phone]) Microscopic Comment(continued) and posterior mid-rectum; and entire lower 1/3 rectum): Negative. Mesenteric margin (sigmoid and transverse): N/A. Distance closest margin (if all above margins negative): proximal margin is 8.5 cm from polypectomy site. Treatment effect (neo-adjuvant therapy): N/A. Additional polyp(s): None. Non-neoplastic findings: Submucosal lipoma. Polypectomy site. Lymph nodes: number examined 16; number positive: 0 Pathologic Staging: pT1, pN0, pMX Ancillary studies: Will not be performed give the small amount of tumor present, but can be attempted upon request. Samantha Males MD Pathologist, Electronic Signature (Case signed 11/15/2014) Specimen  and Clinical Information Specimen(s) Obtained: Samantha, segmental resection for tumor, right proximal Specimen Clinical Information ascending Samantha cancer (kp)  Specimen: Received in formalin labeled right proximal Samantha. Specimen integrity: Intact, with two stapled resection margins. Specimen length: There is 5.5 cm of terminal ileum and 23.0 cm of right Samantha. Mesorectal intactness: Not applicable. Tumor location: Just distal to the ileocecal valve. Tumor size: There is a 0.3 x 0.3 cm pink red, hyperemic area of puckering, grossly consistent with previous polypectomy site. Percent of bowel circumference involved:  Approximately 5%. Tumor distance to margins: Proximal: 8.5 cm. Distal: 19.5 cm. Macroscopic extent of tumor invasion: No invasive component is identified. Total presumed lymph nodes: 16 tan pink possible lymph nodes are identified, ranging from 0.3 cm to 1.4 cm in greatest dimension. Extramural satellite tumor nodules: None identified. Mucosal polyp(s): Within the cecum there is a 1.7 x 1.3 cm sessile, submucosal tan yellow softened nodule identified, grossly suggestive of a lipoma. No  additional lesions are identified. Additional findings: The uninvolved mucosa is tan pink with normal folding, and several diverticula are identified throughout the length of the Samantha. The appendix is present, and measures 7.0 cm in length x 0.5 cm in diameter. The serosal surface is tan pink and smooth. The mucosa is tan pink, and the wall measures 0.2 cm in thickness, and the lumen is grossly unremarkable, filled with a small amount of tan green fecal material. Block summary: A = proximal resection margin B = distal resection margin C and D = possible polypectomy site, entirely submitted E = possible submucosal lipoma F = uninvolved mucosa G = appendix 2 of 3 FINAL for Samantha [name] ([id number]) Samantha [name](continued) H = five possible lymph nodes I = four possible lymph nodes J = four possible lymph nodes K = one possible lymph node L = one possible lymph node, bisected M = one possible lymph node, bisected (KL:ds 11/11/14) Report signed out from the following location(s) Technical component and interpretation was performed at [facility], [place], [place] [address]. CLIA #: [id number], 3 of 3  Diagnosis 1. Surgical [P], ileocecal valve, polyp - INVASIVE ADENOCARCINOMA, SEE COMMENT. 2. Surgical [P], rectum distal - CHANGES CONSISTENT WITH PROLAPSE. - NO DYSPLASIA OR MALIGNANCY. Microscopic Comment 1. There is mucin extending to the fragmented cauterized  margin. Dr. Donato [name] has reviewed the case. The case was called to Dr. Ardis [name] on 08/31/2014. Samantha Males MD Pathologist, Electronic Signature (Case signed 08/31/2014) Specimen Samantha [name] and Clinical Information Specimen Comment 1. Hemorrhage of rectum and anus Specimen(s) Obtained: 1. Surgical [P], ileocecal valve, polyp 2. Surgical [P], rectum distal Specimen Clinical Information 1. R/O adenoma 2. R/O proctitis 1 of 2 FINAL for PEACHES, [place] [phone]) Zenaida Tesar 1. Received in formalin is a tan, soft tissue fragment that is submitted in toto. Size: 0.6 cm ink blue = 2 sec, 1 block submitted. (TA) 2. Received in formalin are tan, soft tissue fragments that are submitted in toto. Number: two, Size: 0.2 cm smallest to 0.3 cm largest, (1 B) ( TA ) Technical component for this case was performed at [facility], [address], [place], [place] [address] CLIA# [id number] Report signed out from the following location(s) Technical Component was performed at [facility]. [address],[place] [address].[id number]., Interpretation was performed at [place], [place], [place] [address]. CLIA #: [id number], 2 of  Consults: None  Hospital Course:   The patient underwent the surgery above.  Postoperatively, the patient gradually mobilized and advanced to a solid diet.  Pain and other symptoms were treated aggressively.    By the time of discharge, the patient was walking well the hallways, eating food, having flatus.  Pain was well-controlled on an oral medications.  Based on meeting discharge criteria and continuing to recover, I felt it was safe for the patient to be discharged from the hospital to further recover with close followup. Postoperative recommendations were discussed in detail.  They are written as well.   Significant Diagnostic Studies:  Results for orders placed or performed during the hospital  encounter of 11/11/14 (from the past 72 hour(s))  Glucose, capillary     Status: Abnormal   Collection Time: 11/13/14  1:27 PM  Result Value Ref Range   Glucose-Capillary 127 (H) 65 - 99 mg/dL  Glucose, capillary     Status: Abnormal   Collection Time: 11/13/14  4:44 PM  Result Value Ref Range   Glucose-Capillary  110 (H) 65 - 99 mg/dL  Glucose, capillary     Status: Abnormal   Collection Time: 11/13/14  9:04 PM  Result Value Ref Range   Glucose-Capillary 108 (H) 65 - 99 mg/dL  Basic metabolic panel     Status: Abnormal   Collection Time: 11/14/14  4:50 AM  Result Value Ref Range   Sodium 138 135 - 145 mmol/L   Potassium 3.9 3.5 - 5.1 mmol/L   Chloride 102 101 - 111 mmol/L   CO2 29 22 - 32 mmol/L   Glucose, Bld 124 (H) 65 - 99 mg/dL   BUN 8 6 - 20 mg/dL   Creatinine, Ser 0.92 0.44 - 1.00 mg/dL   Calcium 8.7 (L) 8.9 - 10.3 mg/dL   GFR calc non Af Amer >60 >60 mL/min   GFR calc Af Amer >60 >60 mL/min    Comment: (NOTE) The eGFR has been calculated using the CKD EPI equation. This calculation has not been validated in all clinical situations. eGFR's persistently <60 mL/min signify possible Chronic Kidney Disease.    Anion gap 7 5 - 15  CBC     Status: Abnormal   Collection Time: 11/14/14  4:50 AM  Result Value Ref Range   WBC 4.2 4.0 - 10.5 K/uL   RBC 4.00 3.87 - 5.11 MIL/uL   Hemoglobin 11.5 (L) 12.0 - 15.0 g/dL   HCT 34.5 (L) 36.0 - 46.0 %   MCV 86.3 78.0 - 100.0 fL   MCH 28.8 26.0 - 34.0 pg   MCHC 33.3 30.0 - 36.0 g/dL   RDW 14.6 11.5 - 15.5 %   Platelets 155 150 - 400 K/uL  Glucose, capillary     Status: Abnormal   Collection Time: 11/14/14  7:48 AM  Result Value Ref Range   Glucose-Capillary 105 (H) 65 - 99 mg/dL  Glucose, capillary     Status: Abnormal   Collection Time: 11/14/14 12:11 PM  Result Value Ref Range   Glucose-Capillary 105 (H) 65 - 99 mg/dL  Glucose, capillary     Status: Abnormal   Collection Time: 11/14/14  5:41 PM  Result Value Ref Range    Glucose-Capillary 111 (H) 65 - 99 mg/dL  Glucose, capillary     Status: Abnormal   Collection Time: 11/14/14 10:06 PM  Result Value Ref Range   Glucose-Capillary 100 (H) 65 - 99 mg/dL  Glucose, capillary     Status: Abnormal   Collection Time: 11/15/14  7:20 AM  Result Value Ref Range   Glucose-Capillary 113 (H) 65 - 99 mg/dL  Glucose, capillary     Status: Abnormal   Collection Time: 11/15/14 11:31 AM  Result Value Ref Range   Glucose-Capillary 106 (H) 65 - 99 mg/dL  Glucose, capillary     Status: None   Collection Time: 11/15/14  5:22 PM  Result Value Ref Range   Glucose-Capillary 87 65 - 99 mg/dL   Comment 1 Notify RN   Glucose, capillary     Status: Abnormal   Collection Time: 11/15/14  9:53 PM  Result Value Ref Range   Glucose-Capillary 122 (H) 65 - 99 mg/dL    No results found.  Discharge Exam: Blood pressure 105/50, pulse 63, temperature 98.2 F (36.8 C), temperature source Oral, resp. rate 18, height 5' 2" (1.575 m), weight 89.359 kg (197 lb), SpO2 97 %.  General: Pt awake/alert/oriented x4 in no major acute distress Eyes: PERRL, normal EOM. Sclera nonicteric Neuro: CN II-XII intact w/o focal sensory/motor deficits. Lymph:  No head/neck/groin lymphadenopathy Psych:  No delerium/psychosis/paranoia HENT: Normocephalic, Mucus membranes moist.  No thrush Neck: Supple, No tracheal deviation Chest: No pain.  Good respiratory excursion. CV:  Pulses intact.  Regular rhythm MS: Normal AROM mjr joints.  No obvious deformity Abdomen: Obese.  Soft, Nondistended.  Min tender at incisions, esp LUQ extraction site.  No incarcerated hernias. Ext:  SCDs BLE.  No significant edema.  No cyanosis Skin: No petechiae / purpura  Discharged Condition: Good   Past Medical History  Diagnosis Date  . Hyperlipidemia   . Hypertension   . Chest pain, atypical 11/2006    saw cards: neg/normal Holter, ECHo and myoview, admitted w/ CP 4/09 neg enz.and CT chest had a 9 beat NSVT  .  Fibromyalgia   . Abnormal CT of the chest 2008    last CT4-l 2009:  . No f/u suggested   . 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 Shingles 11/2009  . Vaginal dysplasia   . Heart murmur   . Asthma   . H/O hiatal hernia   . GERD (gastroesophageal reflux disease)   . Ocular migraine   . TIA (transient ischemic attack) 06/27/2012    "this is my first" (06/27/2012) and one 2 months later  . Rheumatoid factor positive   . Osteoarthritis   . Reactive airway disease 01/29/2002    dx of pseudoasthma / vcd in 2005 and nl sprirometry History of dyspnea, 2011,  improved after several medications were changed around Question of COPD, disproved July 06, 2009 with nl pft's      . TIA (transient ischemic attack)     x2  . OSA (obstructive sleep apnea) 09/2007    dx w/ a sleep study, not on  CPAP  . Pneumonia     "double" in 2004  . Complication of anesthesia     trouble waking up  . PONV (postoperative nausea and vomiting)   . Torn rotator cuff     right worse than left, both are torn  . CHF (congestive heart failure) (HCC)     ischemia   . Type II diabetes mellitus ([facility])     Takes metformin(11/05/14)  . 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 Malignant neoplasm of ascending Samantha ([facility]) 2016    Minimally invasive right hemicolectomy to be done   . Collagen vascular disease ([facility])     "arterial sclerosis" per pt  . CAD (coronary artery disease)     a. Coronary CTA 10/16: Coronary Ca score 211, mod non-obstructive CAD with LM mild plaque (25-50%), mid LAD 50-70%.  . Palpitations 11/23/2008    Qualifier: Diagnosis of  By: Dawson Bills       Past Surgical History  Procedure Laterality Date  . Thyroidectomy, partial  1960's  . Bunionectomy Left ~ 1977  . Breast biopsy Right 1999  . Anterior cervical decomp/discectomy fusion  2001    C 3, C4 and C5 plate and screws  . Cataract extraction w/ intraocular lens  implant, bilateral  2012  . Vaginal mass excision  1994    "Laser surgery for  vaginal cancer; followed by chemotherapy" (06/27/2012)  . Abdominal hysterectomy  1980    NO oophorectomy per pt   . Eye surgery Bilateral     torq lens for cataracts    Social History   Social History  . Marital Status: Married    Spouse Name: Ilona Sorrel  . Number of Children: 2  . Years of  Education: masters   Occupational History  . Retired, disable since 2000    Social History Main Topics  . Smoking status: Former Smoker -- 0.25 packs/day for 5 years    Types: Cigarettes    Quit date: 01/29/1998  . Smokeless tobacco: Never Used     Comment: Quit in 2001  . Alcohol Use: No  . Drug Use: No  . Sexual Activity: No   Other Topics Concern  . Not on file   Social History Narrative   On disability since 2000--- also husband has MS   Education. College.   Right handed.    Family History  Problem Relation Age of Onset  . Allergies Sister   . Parkinsonism Sister     possible  . Asthma Sister   . Asthma Paternal Grandmother   . Heart disease Father   . Heart disease Mother   . Lung cancer Mother   . Heart disease      paternal grandparents, maternal grandparents,   . Heart disease Brother   . Emphysema Brother   . Aneurysm Brother     x3  . Kidney failure Brother   . Diabetes Brother   . Breast cancer Neg Hx   . Samantha cancer Neg Hx   . Diabetes Brother   . Heart attack Neg Hx   . Stroke Brother   . Stroke Maternal Grandmother   . Stroke Paternal Grandmother     Current Facility-Administered Medications  Medication Dose Route Frequency Provider Last Rate Last Dose  . 0.9 %  sodium chloride infusion  250 mL Intravenous PRN Michael Boston, MD      . acetaminophen (TYLENOL) tablet 325-650 mg  325-650 mg Oral Q6H PRN Michael Boston, MD      . albuterol (PROVENTIL) (2.5 MG/3ML) 0.083% nebulizer solution 2.5 mg  2.5 mg Inhalation F0Y PRN Samantha Ruff, MD      . alum & mag hydroxide-simeth (MAALOX/MYLANTA) 200-200-20 MG/5ML suspension 30 mL  30 mL Oral Q6H PRN Michael Boston, MD      . atorvastatin (LIPITOR) tablet 10 mg  10 mg Oral q1800 Michael Boston, MD   10 mg at 11/15/14 1759  . cholecalciferol (VITAMIN D) tablet 1,000 Units  1,000 Units Oral q morning - 10a Michael Boston, MD   1,000 Units at 11/15/14 1005  . clopidogrel (PLAVIX) tablet 75 mg  75 mg Oral Q breakfast Samantha Ruff, MD   75 mg at 11/15/14 0840  . diphenhydrAMINE (BENADRYL) 12.5 MG/5ML elixir 12.5 mg  12.5 mg Oral O3Z PRN Samantha Ruff, MD       Or  . diphenhydrAMINE (BENADRYL) injection 12.5 mg  12.5 mg Intravenous C5Y PRN Samantha Ruff, MD      . DULoxetine (CYMBALTA) DR capsule 60 mg  60 mg Oral Daily Samantha Ruff, MD   60 mg at 11/15/14 1005  . enoxaparin (LOVENOX) injection 40 mg  40 mg Subcutaneous I50Y Samantha Ruff, MD   40 mg at 11/15/14 0840  . famotidine (PEPCID) tablet 20 mg  20 mg Oral BID Michael Boston, MD   20 mg at 11/15/14 2209  . furosemide (LASIX) tablet 20 mg  20 mg Oral BID Samantha Ruff, MD   20 mg at 11/15/14 1759  . hydrALAZINE (APRESOLINE) tablet 10 mg  10 mg Oral TID Michael Boston, MD      . HYDROcodone-acetaminophen (NORCO/VICODIN) 5-325 MG per tablet 1-2 tablet  1-2 tablet Oral D7A PRN Samantha Ruff, MD   2 tablet at  11/16/14 0413  . insulin aspart (novoLOG) injection 0-20 Units  0-20 Units Subcutaneous TID WC Samantha Ruff, MD   3 Units at 11/13/14 1335  . insulin aspart (novoLOG) injection 0-5 Units  0-5 Units Subcutaneous QHS Samantha Ruff, MD   0 Units at 11/11/14 2339  . lactated ringers bolus 1,000 mL  1,000 mL Intravenous Q8H PRN Michael Boston, MD      . lip balm (CARMEX) ointment 1 application  1 application Topical BID Michael Boston, MD   1 application at 69/45/03 2211  . lisinopril (PRINIVIL,ZESTRIL) tablet 40 mg  40 mg Oral Daily Samantha Ruff, MD   40 mg at 11/15/14 1004  . loratadine (CLARITIN) tablet 10 mg  10 mg Oral Daily Michael Boston, MD   10 mg at 11/15/14 1005  . magic mouthwash  15 mL Oral QID PRN Michael Boston, MD      . menthol-cetylpyridinium  (CEPACOL) lozenge 3 mg  1 lozenge Oral PRN Samantha Ruff, MD      . metFORMIN (GLUCOPHAGE-XR) 24 hr tablet 500 mg  500 mg Oral Q breakfast Samantha Ruff, MD   888 mg at 11/15/14 0840  . morphine 2 MG/ML injection 2-4 mg  2-4 mg Intravenous K8M PRN Samantha Ruff, MD   2 mg at 11/13/14 2058  . multivitamin with minerals tablet 1 tablet  1 tablet Oral Daily Michael Boston, MD   1 tablet at 11/15/14 1005  . nebivolol (BYSTOLIC) tablet 10 mg  10 mg Oral Daily Samantha Ruff, MD   10 mg at 11/15/14 1004  . ondansetron (ZOFRAN) injection 4 mg  4 mg Intravenous Q4H PRN Jackolyn Confer, MD   4 mg at 11/13/14 1540   Or  . ondansetron (ZOFRAN) tablet 4 mg  4 mg Oral Q6H PRN Jackolyn Confer, MD      . ondansetron (ZOFRAN-ODT) disintegrating tablet 4 mg  4 mg Oral Q8H PRN Michael Boston, MD      . phenol (CHLORASEPTIC) mouth spray 2 spray  2 spray Mouth/Throat PRN Michael Boston, MD      . polyvinyl alcohol (LIQUIFILM TEARS) 1.4 % ophthalmic solution 1 drop  1 drop Both Eyes PRN Samantha Ruff, MD      . potassium gluconate tablet 595 mg  034 mg Oral Daily Samantha Ruff, MD   917 mg at 11/15/14 1005  . pregabalin (LYRICA) capsule 100 mg  100 mg Oral QHS PRN Michael Boston, MD      . sodium chloride 0.9 % injection 3 mL  3 mL Intravenous Q12H Michael Boston, MD   3 mL at 11/15/14 2210  . sodium chloride 0.9 % injection 3 mL  3 mL Intravenous PRN Michael Boston, MD         Allergies  Allergen Reactions  . Cefuroxime Axetil     REACTION: urticaria (hives)  . Oxycodone Nausea And Vomiting  . Seldane [Terfenadine]     REACTION: urticaria (hives)  . Zocor [Simvastatin]     2012 "Muscle breakdown " with profuse sweating  . Tramadol     REACTION: vomitting  . Bactrim [Sulfamethoxazole-Trimethoprim]     See OV 09-15-13, rash-tongue swelling due to bactrim ?  . Pravastatin     "muscle breakdown" with profuse sweating    Disposition: 01-Home or Self Care  Discharge Instructions    Call MD for:  extreme fatigue     Complete by:  As directed      Call MD for:  hives    Complete by:  As directed      Call MD for:  persistant nausea and vomiting    Complete by:  As directed      Call MD for:  redness, tenderness, or signs of infection (pain, swelling, redness, odor or green/yellow discharge around incision site)    Complete by:  As directed      Call MD for:  severe uncontrolled pain    Complete by:  As directed      Call MD for:    Complete by:  As directed   Temperature > 101.58F     Diet - low sodium heart healthy    Complete by:  As directed      Discharge instructions    Complete by:  As directed   Please see discharge instruction sheets.  Also refer to handout given an office.  Please call our office if you have any questions or concerns [phone]     Discharge wound care:    Complete by:  As directed   If you have closed incisions, shower and bathe over these incisions with soap and water every day.  Remove all surgical dressings on postoperative day #3.  You do not need to replace dressings over the closed incisions unless you feel more comfortable with a Band-Aid covering it.   If you have an open wound that requires packing, please see wound care instructions.  In general, remove all dressings, wash wound with soap and water and then replace with saline moistened gauze.  Do the dressing change at least every day.  Please call our office [phone] if you have further questions.     Driving Restrictions    Complete by:  As directed   No driving until off narcotics and can safely swerve away without pain during an emergency     Increase activity slowly    Complete by:  As directed   Walk an hour a day.  Use 20-30 minute walks.  When you can walk 30 minutes without difficulty, it is fine to restart low impact/moderate activities such as biking, jogging, swimming, sexual activity, etc.  Eventually you can increase to unrestricted activity when not feeling pain.  If you feel pain: STOP![name]    Let pain protect you from overdoing it.  Use ice/heat & over-the-counter pain medications to help minimize soreness.  If that is not enough, then use your narcotic pain prescription as needed to remain active.  It is better to take extra pain medications and be more active than to stay bedridden to avoid all pain medications.     Lifting restrictions    Complete by:  As directed   Avoid heavy lifting initially.  Do not push through pain.  You have no specific weight limit - if it hurts to do, DON'T DO IT.   If you feel no pain, you are not injuring anything.  Pain will protect you from injury.  Coughing and sneezing are far more stressful to your incision than any lifting.  Avoid resuming heavy lifting / intense activity until off all narcotic pain medications.  When ready to exercise more, give yourself 2 weeks to gradually get back to full intense exercise/activity.     May shower / Bathe    Complete by:  As directed      May walk up steps    Complete by:  As directed      Sexual Activity Restrictions    Complete by:  As directed   Sexual activity  as tolerated.  Do not push through pain.  Pain will protect you from injury.     Walk with assistance    Complete by:  As directed   Walk over an hour a day.  May use a walker/cane/companion to help with balance and stamina.            Medication List    TAKE these medications        acetaminophen 500 MG tablet  Commonly known as:  TYLENOL  Take 1,000 mg by mouth every 6 (six) hours as needed for moderate pain.     albuterol 108 (90 BASE) MCG/ACT inhaler  Commonly known as:  PROVENTIL HFA;VENTOLIN HFA  Inhale 2 puffs into the lungs every 6 (six) hours as needed for wheezing or shortness of breath.     atorvastatin 10 MG tablet  Commonly known as:  LIPITOR  Take 1 tablet (10 mg total) by mouth daily.     butalbital-acetaminophen-caffeine 50-325-40 MG tablet  Commonly known as:  FIORICET, ESGIC  As needed for migraine     CALCIUM PO   Take 1 tablet by mouth every morning.     cetirizine 10 MG tablet  Commonly known as:  ZYRTEC  Take 1 tablet (10 mg total) by mouth daily.     cholecalciferol 1000 UNITS tablet  Commonly known as:  VITAMIN D  Take 1,000 Units by mouth every morning.     clopidogrel 75 MG tablet  Commonly known as:  PLAVIX  Take 1 tablet (75 mg total) by mouth daily with breakfast.     DULoxetine 60 MG capsule  Commonly known as:  CYMBALTA  TAKE 1 CAPSULE (60 MG TOTAL) BY MOUTH DAILY.     furosemide 20 MG tablet  Commonly known as:  LASIX  Take 1 tablet (20 mg total) by mouth 2 (two) times daily.     hydrALAZINE 25 MG tablet  Commonly known as:  APRESOLINE  Take 1 tablet (25 mg total) by mouth 3 (three) times daily.     HYDROcodone-acetaminophen 5-325 MG tablet  Commonly known as:  NORCO/VICODIN  Take 1-2 tablets by mouth every 6 (six) hours as needed for moderate pain or severe pain.     lisinopril 40 MG tablet  Commonly known as:  PRINIVIL,ZESTRIL  Take 1 tablet (40 mg total) by mouth daily.     metFORMIN 500 MG 24 hr tablet  Commonly known as:  GLUCOPHAGE-XR  Take 1 tablet (500 mg total) by mouth daily with breakfast.     multivitamin capsule  Take 1 capsule by mouth daily.     nebivolol 10 MG tablet  Commonly known as:  BYSTOLIC  TAKE 2 TABLETS BY MOUTH IN THE MORNING AND 1 TABLET IN THE EVENING SPACE 12 HOURS APART     ondansetron 4 MG disintegrating tablet  Commonly known as:  ZOFRAN ODT  Take 1 tablet (4 mg total) by mouth every 8 (eight) hours as needed for nausea or vomiting.     OSTEO BI-FLEX JOINT SHIELD Tabs  Take 1 tablet by mouth daily.     Potassium 99 MG Tabs  Take 99 mg by mouth daily.     pregabalin 100 MG capsule  Commonly known as:  LYRICA  Take 1 capsule (100 mg total) by mouth at bedtime as needed.     promethazine 12.5 MG tablet  Commonly known as:  PHENERGAN  Take 1 tablet (12.5 mg total) by mouth every 6 (six) hours as needed for  nausea.      ranitidine 300 MG tablet  Commonly known as:  ZANTAC  Take 1 tablet (300 mg total) by mouth at bedtime.     SYSTANE 0.4-0.3 % Gel ophthalmic gel  Generic drug:  Polyethyl Glycol-Propyl Glycol  Apply 1 drop to eye daily as needed (dry eyes).           Follow-up Information    Follow up with Rosario Adie., MD. Schedule an appointment as soon as possible for a visit in 2 weeks.   Specialty:  General Surgery   Why:  To follow up after your operation, To follow up after your hospital stay   Contact information:   [place] [address] [phone]        Signed: Morton Peters, M.D., F.A.C.S. Gastrointestinal and Minimally Invasive Surgery [facility] [address], [place], [place] [address] [phone] Main / Paging   11/16/2014, 8:03 AM

## 2014-11-16 NOTE — Care Management Note (Signed)
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 Discussed recommendations for [facility] services. Patient states she has used [facility] in the past and would like to use them again. Patient has RW, requesting 3-n-1. Contacted [facility] for referral.   Expected Discharge Date:                  Expected Discharge Plan:  [place]  In-House Referral:  NA  Discharge planning Services  CM Consult  Post Acute Care Choice:  Home Health Choice offered to:  NA  DME Arranged:  3-N-1 DME Agency:  [place]:  PT, OT [place] Agency:  [place]  Status of Service:  Completed, signed off  Medicare Important Message Given:  Yes-second notification given Date Medicare IM Given:    Medicare IM give by:    Date Additional Medicare IM Given:    Additional Medicare Important Message give by:     If discussed at [place] of Stay Meetings, dates discussed:    Additional Comments:  Guadalupe Maple, RN 11/16/2014, 10:33 AM

## 2014-11-18 ENCOUNTER — Ambulatory Visit: Payer: Medicare Other | Admitting: Internal Medicine

## 2014-11-18 DIAGNOSIS — M797 Fibromyalgia: Secondary | ICD-10-CM | POA: Diagnosis not present

## 2014-11-18 DIAGNOSIS — Z8673 Personal history of transient ischemic attack (TIA), and cerebral infarction without residual deficits: Secondary | ICD-10-CM | POA: Diagnosis not present

## 2014-11-18 DIAGNOSIS — I509 Heart failure, unspecified: Secondary | ICD-10-CM | POA: Diagnosis not present

## 2014-11-18 DIAGNOSIS — Z87891 Personal history of nicotine dependence: Secondary | ICD-10-CM | POA: Diagnosis not present

## 2014-11-18 DIAGNOSIS — Z7901 Long term (current) use of anticoagulants: Secondary | ICD-10-CM | POA: Diagnosis not present

## 2014-11-18 DIAGNOSIS — M1990 Unspecified osteoarthritis, unspecified site: Secondary | ICD-10-CM | POA: Diagnosis not present

## 2014-11-18 DIAGNOSIS — Z483 Aftercare following surgery for neoplasm: Secondary | ICD-10-CM | POA: Diagnosis not present

## 2014-11-18 DIAGNOSIS — E119 Type 2 diabetes mellitus without complications: Secondary | ICD-10-CM | POA: Diagnosis not present

## 2014-11-18 DIAGNOSIS — J45909 Unspecified asthma, uncomplicated: Secondary | ICD-10-CM | POA: Diagnosis not present

## 2014-11-18 DIAGNOSIS — G4733 Obstructive sleep apnea (adult) (pediatric): Secondary | ICD-10-CM | POA: Diagnosis not present

## 2014-11-18 DIAGNOSIS — Z7984 Long term (current) use of oral hypoglycemic drugs: Secondary | ICD-10-CM | POA: Diagnosis not present

## 2014-11-18 DIAGNOSIS — I10 Essential (primary) hypertension: Secondary | ICD-10-CM | POA: Diagnosis not present

## 2014-11-18 DIAGNOSIS — Z85038 Personal history of other malignant neoplasm of large intestine: Secondary | ICD-10-CM | POA: Diagnosis not present

## 2014-11-19 DIAGNOSIS — M797 Fibromyalgia: Secondary | ICD-10-CM | POA: Diagnosis not present

## 2014-11-19 DIAGNOSIS — Z483 Aftercare following surgery for neoplasm: Secondary | ICD-10-CM | POA: Diagnosis not present

## 2014-11-19 DIAGNOSIS — J45909 Unspecified asthma, uncomplicated: Secondary | ICD-10-CM | POA: Diagnosis not present

## 2014-11-19 DIAGNOSIS — I509 Heart failure, unspecified: Secondary | ICD-10-CM | POA: Diagnosis not present

## 2014-11-19 DIAGNOSIS — I10 Essential (primary) hypertension: Secondary | ICD-10-CM | POA: Diagnosis not present

## 2014-11-19 DIAGNOSIS — E119 Type 2 diabetes mellitus without complications: Secondary | ICD-10-CM | POA: Diagnosis not present

## 2014-11-23 DIAGNOSIS — I10 Essential (primary) hypertension: Secondary | ICD-10-CM | POA: Diagnosis not present

## 2014-11-23 DIAGNOSIS — M797 Fibromyalgia: Secondary | ICD-10-CM | POA: Diagnosis not present

## 2014-11-23 DIAGNOSIS — I509 Heart failure, unspecified: Secondary | ICD-10-CM | POA: Diagnosis not present

## 2014-11-23 DIAGNOSIS — E119 Type 2 diabetes mellitus without complications: Secondary | ICD-10-CM | POA: Diagnosis not present

## 2014-11-23 DIAGNOSIS — Z483 Aftercare following surgery for neoplasm: Secondary | ICD-10-CM | POA: Diagnosis not present

## 2014-11-23 DIAGNOSIS — J45909 Unspecified asthma, uncomplicated: Secondary | ICD-10-CM | POA: Diagnosis not present

## 2014-11-25 DIAGNOSIS — Z483 Aftercare following surgery for neoplasm: Secondary | ICD-10-CM | POA: Diagnosis not present

## 2014-11-25 DIAGNOSIS — E119 Type 2 diabetes mellitus without complications: Secondary | ICD-10-CM | POA: Diagnosis not present

## 2014-11-25 DIAGNOSIS — J45909 Unspecified asthma, uncomplicated: Secondary | ICD-10-CM | POA: Diagnosis not present

## 2014-11-25 DIAGNOSIS — M797 Fibromyalgia: Secondary | ICD-10-CM | POA: Diagnosis not present

## 2014-11-25 DIAGNOSIS — I509 Heart failure, unspecified: Secondary | ICD-10-CM | POA: Diagnosis not present

## 2014-11-25 DIAGNOSIS — I10 Essential (primary) hypertension: Secondary | ICD-10-CM | POA: Diagnosis not present

## 2014-11-30 DIAGNOSIS — M797 Fibromyalgia: Secondary | ICD-10-CM | POA: Diagnosis not present

## 2014-11-30 DIAGNOSIS — I10 Essential (primary) hypertension: Secondary | ICD-10-CM | POA: Diagnosis not present

## 2014-11-30 DIAGNOSIS — E119 Type 2 diabetes mellitus without complications: Secondary | ICD-10-CM | POA: Diagnosis not present

## 2014-11-30 DIAGNOSIS — J45909 Unspecified asthma, uncomplicated: Secondary | ICD-10-CM | POA: Diagnosis not present

## 2014-11-30 DIAGNOSIS — Z483 Aftercare following surgery for neoplasm: Secondary | ICD-10-CM | POA: Diagnosis not present

## 2014-11-30 DIAGNOSIS — I509 Heart failure, unspecified: Secondary | ICD-10-CM | POA: Diagnosis not present

## 2014-12-02 DIAGNOSIS — M797 Fibromyalgia: Secondary | ICD-10-CM | POA: Diagnosis not present

## 2014-12-02 DIAGNOSIS — I509 Heart failure, unspecified: Secondary | ICD-10-CM | POA: Diagnosis not present

## 2014-12-02 DIAGNOSIS — E119 Type 2 diabetes mellitus without complications: Secondary | ICD-10-CM | POA: Diagnosis not present

## 2014-12-02 DIAGNOSIS — J45909 Unspecified asthma, uncomplicated: Secondary | ICD-10-CM | POA: Diagnosis not present

## 2014-12-02 DIAGNOSIS — I10 Essential (primary) hypertension: Secondary | ICD-10-CM | POA: Diagnosis not present

## 2014-12-02 DIAGNOSIS — Z483 Aftercare following surgery for neoplasm: Secondary | ICD-10-CM | POA: Diagnosis not present

## 2014-12-07 DIAGNOSIS — M797 Fibromyalgia: Secondary | ICD-10-CM | POA: Diagnosis not present

## 2014-12-07 DIAGNOSIS — Z483 Aftercare following surgery for neoplasm: Secondary | ICD-10-CM | POA: Diagnosis not present

## 2014-12-07 DIAGNOSIS — J45909 Unspecified asthma, uncomplicated: Secondary | ICD-10-CM | POA: Diagnosis not present

## 2014-12-07 DIAGNOSIS — E119 Type 2 diabetes mellitus without complications: Secondary | ICD-10-CM | POA: Diagnosis not present

## 2014-12-07 DIAGNOSIS — I509 Heart failure, unspecified: Secondary | ICD-10-CM | POA: Diagnosis not present

## 2014-12-07 DIAGNOSIS — I10 Essential (primary) hypertension: Secondary | ICD-10-CM | POA: Diagnosis not present

## 2014-12-14 DIAGNOSIS — I509 Heart failure, unspecified: Secondary | ICD-10-CM | POA: Diagnosis not present

## 2014-12-14 DIAGNOSIS — E119 Type 2 diabetes mellitus without complications: Secondary | ICD-10-CM | POA: Diagnosis not present

## 2014-12-14 DIAGNOSIS — Z483 Aftercare following surgery for neoplasm: Secondary | ICD-10-CM | POA: Diagnosis not present

## 2014-12-14 DIAGNOSIS — M797 Fibromyalgia: Secondary | ICD-10-CM | POA: Diagnosis not present

## 2014-12-14 DIAGNOSIS — I10 Essential (primary) hypertension: Secondary | ICD-10-CM | POA: Diagnosis not present

## 2014-12-14 DIAGNOSIS — J45909 Unspecified asthma, uncomplicated: Secondary | ICD-10-CM | POA: Diagnosis not present

## 2014-12-16 DIAGNOSIS — Z483 Aftercare following surgery for neoplasm: Secondary | ICD-10-CM | POA: Diagnosis not present

## 2014-12-16 DIAGNOSIS — J45909 Unspecified asthma, uncomplicated: Secondary | ICD-10-CM | POA: Diagnosis not present

## 2014-12-16 DIAGNOSIS — E119 Type 2 diabetes mellitus without complications: Secondary | ICD-10-CM | POA: Diagnosis not present

## 2014-12-16 DIAGNOSIS — I509 Heart failure, unspecified: Secondary | ICD-10-CM | POA: Diagnosis not present

## 2014-12-16 DIAGNOSIS — M797 Fibromyalgia: Secondary | ICD-10-CM | POA: Diagnosis not present

## 2014-12-16 DIAGNOSIS — I10 Essential (primary) hypertension: Secondary | ICD-10-CM | POA: Diagnosis not present

## 2014-12-17 DIAGNOSIS — G43B0 Ophthalmoplegic migraine, not intractable: Secondary | ICD-10-CM | POA: Diagnosis not present

## 2015-01-05 ENCOUNTER — Encounter: Payer: Self-pay | Admitting: Cardiology

## 2015-01-05 ENCOUNTER — Ambulatory Visit (INDEPENDENT_AMBULATORY_CARE_PROVIDER_SITE_OTHER): Payer: Medicare Other | Admitting: Cardiology

## 2015-01-05 VITALS — BP 120/76 | HR 68 | Ht 62.0 in | Wt 193.6 lb

## 2015-01-05 DIAGNOSIS — I2510 Atherosclerotic heart disease of native coronary artery without angina pectoris: Secondary | ICD-10-CM | POA: Diagnosis not present

## 2015-01-05 DIAGNOSIS — I5032 Chronic diastolic (congestive) heart failure: Secondary | ICD-10-CM | POA: Diagnosis not present

## 2015-01-05 DIAGNOSIS — E11311 Type 2 diabetes mellitus with unspecified diabetic retinopathy with macular edema: Secondary | ICD-10-CM

## 2015-01-05 DIAGNOSIS — E785 Hyperlipidemia, unspecified: Secondary | ICD-10-CM

## 2015-01-05 DIAGNOSIS — Z794 Long term (current) use of insulin: Secondary | ICD-10-CM

## 2015-01-05 DIAGNOSIS — I10 Essential (primary) hypertension: Secondary | ICD-10-CM | POA: Diagnosis not present

## 2015-01-05 DIAGNOSIS — I2583 Coronary atherosclerosis due to lipid rich plaque: Secondary | ICD-10-CM

## 2015-01-05 MED ORDER — ATORVASTATIN CALCIUM 20 MG PO TABS: 20.0000 mg | ORAL_TABLET | Freq: Every day | ORAL | Status: DC

## 2015-01-05 NOTE — Patient Instructions (Signed)
Medication Instructions:   INCREASE YOUR ATORVASTATIN TO 20 MG ONCE DAILY   Labwork:  PRIOR TO YOUR 6 MONTH FOLLOW-UP APPOINTMENT WITH DR Meda Coffee TO CHECK A ---CMET AND LIPIDS---PLEASE COME FASTING TO THIS LAB APPOINTMENT    You have been referred to [facility] DR Zadie Rhine    Follow-Up:  Your physician wants you to follow-up in: [place] will receive a reminder letter in the mail two months in advance. If you don't receive a letter, please call our office to schedule the follow-up appointment.  PLEASE HAVE YOUR LABS PRIOR TO THIS APPOINTMENT    If you need a refill on your cardiac medications before your next appointment, please call your pharmacy.

## 2015-01-05 NOTE — Progress Notes (Signed)
Patient ID: Samantha [name], female   DOB: 04-Jan-1941, 74 y.o.   MRN: [id number]      Cardiology Office Note  Date:  01/05/2015   ID:  Dacey, Beerman 1940-04-30, MRN [id number]  PCP:  Kathlene November, MD  Cardiologist:  Dr. Ena Dawley    Chief complaint:   History of Present Illness: Samantha [name] is a 74 y.o. female with a hx of diastolic HF, DM2, HTN, HL, fibromyalgia, palpitations secondary to symptomatic PVCs, LE edema. Nuclear study in 2014 was normal. Echo in 2014 with normal LV function to moderate diastolic dysfunction. Holter monitoring 2014 demonstrated sinus rhythm with PVCs. Last seen by [name] 3/16.  Admitted 10/20/14 with chest pain, nausea and diaphoresis. Symptoms resolved with nitroglycerin and aspirin in the emergency room. Serial troponin levels were normal. Echocardiogram demonstrated LVH, diastolic dysfunction and normal ejection fraction. There were no wall motion abnormalities. Patient has a recent diagnosis of colon CA and is set to undergo robotic-assisted partial colectomy 11/11/14. Symptoms were felt to likely be from musculoskeletal etiology as they occurred after lifting her husband who has MS. However given the need for surgical clearance, she was set up for outpatient nuclear study. Myoview 10/26/14 demonstrated EF 70% with small inferolateral and apical defect consistent with ischemia. Study was felt to be low risk. She saw Richardson Dopp in October 2016 and coronary CT was ordered. It showed 50-69% proximal to middle LAD stenosis and she was referred for colectomy that she underwent on 11/14/2014 without any complications. She is currently fully recovered and feels great. She denies any chest pain, shortness of breath, lower extremity edema, orthopnea, paroxysmal nocturnal dyspnea, palpitations or syncope. She is compliant with her medicines. She received a great news after his surgery did she's currently cancer free. She is very positive and active.     Studies/Reports Reviewed Today:   - Myoview 10/26/14: EF 70%, small area of inf-lat and apical ischemia, Low Risk  - Echo 10/20/14:  Focal basal and mod LVH, EF 60-65%, no RWMA, Gr 1 DD  - Nuclear study 11/08: Normal.   - Echocardiogram 3/14: Mild LVH, EF [id number], grade 2 diastolic dysfunction.   - Carotid US 5/14: < 39% bilateral ICA stenosis.   - Holter monitor x 24 hours 07/2012: NSR with PVCs.   Carlton Adam Myoview 09/24/12 was normal.   Past Medical History  Diagnosis Date  . Hyperlipidemia   . Hypertension   . Chest pain, atypical 11/2006    saw cards: neg/normal Holter, ECHo and myoview, admitted w/ CP 4/09 neg enz.and CT chest had a 9 beat NSVT  . Fibromyalgia   . Abnormal CT of the chest 2008    last CT4-l 2009:  . No f/u suggested   . 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 Shingles 11/2009  . Vaginal dysplasia   . Heart murmur   . Asthma   . H/O hiatal hernia   . GERD (gastroesophageal reflux disease)   . Ocular migraine   . TIA (transient ischemic attack) 06/27/2012    "this is my first" (06/27/2012) and one 2 months later  . Rheumatoid factor positive   . Osteoarthritis   . Reactive airway disease 01/29/2002    dx of pseudoasthma / vcd in 2005 and nl sprirometry History of dyspnea, 2011,  improved after several medications were changed around Question of COPD, disproved July 06, 2009 with nl pft's      .  TIA (transient ischemic attack)     x2  . OSA (obstructive sleep apnea) 09/2007    dx w/ a sleep study, not on  CPAP  . Pneumonia     "double" in 2004  . Complication of anesthesia     trouble waking up  . PONV (postoperative nausea and vomiting)   . Torn rotator cuff     right worse than left, both are torn  . CHF (congestive heart failure) (HCC)     ischemia   . Type II diabetes mellitus ([place])     Takes metformin(11/05/14)  . 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 Malignant neoplasm of ascending colon ([place]) 2016    Minimally invasive right  hemicolectomy to be done   . Collagen vascular disease ([place])     "arterial sclerosis" per pt  . CAD (coronary artery disease)     a. Coronary CTA 10/16: Coronary Ca score 211, mod non-obstructive CAD with LM mild plaque (25-50%), mid LAD 50-70%.  . Palpitations 11/23/2008    Qualifier: Diagnosis of  By: Dawson Bills       Past Surgical History  Procedure Laterality Date  . Thyroidectomy, partial  1960's  . Bunionectomy Left ~ 1977  . Breast biopsy Right 1999  . Anterior cervical decomp/discectomy fusion  2001    C 3, C4 and C5 plate and screws  . Cataract extraction w/ intraocular lens  implant, bilateral  2012  . Vaginal mass excision  1994    "Laser surgery for vaginal cancer; followed by chemotherapy" (06/27/2012)  . Abdominal hysterectomy  1980    NO oophorectomy per pt   . Eye surgery Bilateral     torq lens for cataracts     Current Outpatient Prescriptions  Medication Sig Dispense Refill  . acetaminophen (TYLENOL) 500 MG tablet Take 1,000 mg by mouth every 6 (six) hours as needed for moderate pain.    [name] albuterol (PROVENTIL HFA;VENTOLIN HFA) 108 (90 BASE) MCG/ACT inhaler Inhale 2 puffs into the lungs every 6 (six) hours as needed for wheezing or shortness of breath.    [name] atorvastatin (LIPITOR) 10 MG tablet Take 10 mg by mouth daily at 6 PM.    . butalbital-acetaminophen-caffeine (FIORICET) 50-325-40 MG tablet Take 1 tablet by mouth every 6 (six) hours as needed for headache or migraine.    [name] CALCIUM PO Take 1 tablet by mouth every morning.     . cetirizine (ZYRTEC) 10 MG tablet Take 1 tablet (10 mg total) by mouth daily. 90 tablet 1  . cholecalciferol (VITAMIN D) 1000 UNITS tablet Take 1,000 Units by mouth every morning.     . clopidogrel (PLAVIX) 75 MG tablet Take 1 tablet (75 mg total) by mouth daily with breakfast. 90 tablet 3  . DULoxetine (CYMBALTA) 60 MG capsule TAKE 1 CAPSULE (60 MG TOTAL) BY MOUTH DAILY. 30 capsule 11  . furosemide (LASIX) 20 MG tablet Take 1  tablet (20 mg total) by mouth 2 (two) times daily. 30 tablet 6  . hydrALAZINE (APRESOLINE) 25 MG tablet Take 1 tablet (25 mg total) by mouth 3 (three) times daily. 270 tablet 3  . HYDROcodone-acetaminophen (NORCO/VICODIN) 5-325 MG tablet Take 1-2 tablets by mouth every 6 (six) hours as needed for moderate pain or severe pain. 40 tablet 0  . lisinopril (PRINIVIL,ZESTRIL) 40 MG tablet Take 1 tablet (40 mg total) by mouth daily. 90 tablet 3  . metFORMIN (GLUCOPHAGE-XR) 500 MG 24 hr tablet Take 1 tablet (500 mg total) by mouth daily  with breakfast. 90 tablet 1  . Misc Natural Products (OSTEO BI-FLEX JOINT SHIELD) TABS Take 1 tablet by mouth daily.    . Multiple Vitamin (MULTIVITAMIN) capsule Take 1 capsule by mouth daily.      . nebivolol (BYSTOLIC) 10 MG tablet Take 10 mg by mouth daily. Take 20mg  by mouth every morning and take 10 mg by mouth every evening    . ondansetron (ZOFRAN ODT) 4 MG disintegrating tablet Take 1 tablet (4 mg total) by mouth every 8 (eight) hours as needed for nausea or vomiting. 12 tablet 1  . Polyethyl Glycol-Propyl Glycol (SYSTANE) 0.4-0.3 % GEL Place 1 drop into both eyes daily as needed (dry eyes).     . Potassium 99 MG TABS Take 99 mg by mouth daily.     . pregabalin (LYRICA) 100 MG capsule Take 100 mg by mouth 3 times/day as needed-between meals & bedtime (nerve pain or sleep).    . promethazine (PHENERGAN) 12.5 MG tablet Take 1 tablet (12.5 mg total) by mouth every 6 (six) hours as needed for nausea. 10 tablet 1  . ranitidine (ZANTAC) 300 MG tablet Take 1 tablet (300 mg total) by mouth at bedtime. 90 tablet 1   No current facility-administered medications for this visit.    Allergies:   Cefuroxime axetil; Oxycodone; Seldane; Zocor; Tramadol; Bactrim; and Pravastatin    Social History:  The patient  reports that she quit smoking about 16 years ago. Her smoking use included Cigarettes. She has a 1.25 pack-year smoking history. She has never used smokeless tobacco. She  reports that she does not drink alcohol or use illicit drugs.   Family History:  The patient's family history includes Allergies in her sister; Aneurysm in her brother; Asthma in her paternal grandmother and sister; Diabetes in her brother and brother; Emphysema in her brother; Heart disease in her brother, father, mother, and another family member; Kidney failure in her brother; Lung cancer in her mother; Parkinsonism in her sister; Stroke in her brother, maternal grandmother, and paternal grandmother. There is no history of Breast cancer, Colon cancer, or Heart attack.    ROS:   Please see the history of present illness.   Review of Systems  Constitution: Positive for malaise/fatigue.  HENT: Positive for headaches.   Cardiovascular: Positive for chest pain and irregular heartbeat.  Respiratory: Positive for cough, shortness of breath and wheezing.   Hematologic/Lymphatic: Bruises/bleeds easily.  Musculoskeletal: Positive for joint pain and myalgias.  Gastrointestinal: Positive for abdominal pain, diarrhea, hematochezia and nausea.  Neurological: Positive for dizziness and loss of balance.  All other systems reviewed and are negative.     PHYSICAL EXAM: VS:  BP 120/76 mmHg  Pulse 68  Ht 5\' 2"  (1.575 m)  Wt 193 lb 9.6 oz (87.816 kg)  BMI 35.40 kg/m2  SpO2 98%    Wt Readings from Last 3 Encounters:  01/05/15 193 lb 9.6 oz (87.816 kg)  11/11/14 197 lb (89.359 kg)  11/04/14 197 lb (89.359 kg)     GEN: Well nourished, well developed, in no acute distress HEENT: normal Neck: no JVD,  no masses Cardiac:  Normal S1/S2, RRR; no murmur ,  no rubs or gallops, no edema   Respiratory:  clear to auscultation bilaterally, no wheezing, rhonchi or rales. GI: soft, nontender, nondistended, + BS MS: no deformity or atrophy Skin: warm and dry  Neuro:  CNs II-XII intact, Strength and sensation are intact Psych: Normal affect   EKG:  EKG is ordered today.  It demonstrates:   NSR, HR 61,  normal axis, first degree AV block, QTC 432, no change from prior tracing   Recent Labs: 10/19/2014: B Natriuretic Peptide 54.1 10/20/2014: ALT 17 11/02/2014: TSH 2.06 11/14/2014: BUN 8; Creatinine, Ser 0.92; Hemoglobin 11.5*; Platelets 155; Potassium 3.9; Sodium 138    Lipid Panel    Component Value Date/Time   CHOL 171 05/19/2014 1059   TRIG 112.0 05/19/2014 1059   HDL 65.50 05/19/2014 1059   CHOLHDL 3 05/19/2014 1059   VLDL 22.4 05/19/2014 1059   LDLCALC 83 05/19/2014 1059   LDLDIRECT 126.2 12/07/2010 1039    Coronary CTA: Coronary Arteries: Normal coronary origin. Right dominance. RCA is a large dominant vessel that gives rise to PDA. There is only minimal plaque. PLA is very small and poorly visualized. Left main gives rise to LAD, a very small ramus intermedius. Left main has mild excentric calcified plaque with associated stenosis 25-50%. LAD is a large tortuous vessel that has mild mixed predominantly calcified plaque in its proximal segment and associated stenosis 25-50%. Mid LAD has moderate mixed plaque with a focal stenosis of 50-70%. Distal LAD has minimal plaque. The first diagonal branch has a calcified plaque in its mid segment. The vessel size and the study quality doesn't allow accurate stenosis assessment. LCX is a small non-dominant vessel that ha only minimal plaque. Ramus intermedius is a very small vessel with no obvious plaque. IMPRESSION: 1. Coronary calcium score of 211. This was 4 percentile for age and sex matched control. 2. Normal coronary origin. Right dominance. 3. Mild mixed plaque in the proximal LAD and moderate plaque in the mid LAD with maximum stenosis 50-69%. Nonobstructive CAD. An aggressive risk factor modification is recommended. Ena Dawley  ASSESSMENT AND PLAN:  1. Moderate nonobstructive CAD and proximal LAD - the patient is currently asymptomatic and underwent colectomy without any complications. We will increase her  atorvastatin to moderate dose since she is a known diabetic. She agrees, we will repeat her lipids and comprehensive metabolic profile prior to the next visit in 6 months.  2. Chronic Diastolic CHF:  Volume appears stable.  Continue current Rx.   3. HTN:  Controlled.   4. Hyperlipidemia: increase statin as stated above.  5. Diabetes:  Controlled. A1c 10/16 was 6.5.  FU with PCP.   6. Colon CA:  She is currently cancer free.  Follow-up in 6 months.  [name] 01/05/2015 9:31 AM    [facility] [place], [place], [place]  [address] Phone: [phone]; Fax: [phone]

## 2015-01-11 ENCOUNTER — Encounter: Payer: Self-pay | Admitting: Neurology

## 2015-01-11 ENCOUNTER — Ambulatory Visit (INDEPENDENT_AMBULATORY_CARE_PROVIDER_SITE_OTHER): Payer: Medicare Other | Admitting: Neurology

## 2015-01-11 VITALS — BP 151/79 | HR 60 | Ht 62.0 in | Wt 192.0 lb

## 2015-01-11 DIAGNOSIS — I2510 Atherosclerotic heart disease of native coronary artery without angina pectoris: Secondary | ICD-10-CM | POA: Diagnosis not present

## 2015-01-11 DIAGNOSIS — E11311 Type 2 diabetes mellitus with unspecified diabetic retinopathy with macular edema: Secondary | ICD-10-CM | POA: Diagnosis not present

## 2015-01-11 DIAGNOSIS — Z794 Long term (current) use of insulin: Secondary | ICD-10-CM

## 2015-01-11 DIAGNOSIS — Z85038 Personal history of other malignant neoplasm of large intestine: Secondary | ICD-10-CM

## 2015-01-11 DIAGNOSIS — G43109 Migraine with aura, not intractable, without status migrainosus: Secondary | ICD-10-CM

## 2015-01-11 DIAGNOSIS — I2583 Coronary atherosclerosis due to lipid rich plaque: Secondary | ICD-10-CM

## 2015-01-11 MED ORDER — BUTALBITAL-APAP-CAFFEINE 50-325-40 MG PO TABS: 1.0000 | ORAL_TABLET | Freq: Four times a day (QID) | ORAL | Status: DC | PRN

## 2015-01-11 MED ORDER — TOPIRAMATE 50 MG PO TABS: ORAL_TABLET | ORAL | Status: DC
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
## 2015-01-12 ENCOUNTER — Ambulatory Visit
Admission: RE | Admit: 2015-01-12 | Discharge: 2015-01-12 | Disposition: A | Discharge status: Home / Self Care | Payer: Medicare Other | Source: Ambulatory Visit | Attending: Neurology | Admitting: Neurology

## 2015-01-12 DIAGNOSIS — E11311 Type 2 diabetes mellitus with unspecified diabetic retinopathy with macular edema: Secondary | ICD-10-CM

## 2015-01-12 DIAGNOSIS — I2510 Atherosclerotic heart disease of native coronary artery without angina pectoris: Secondary | ICD-10-CM | POA: Diagnosis not present

## 2015-01-12 DIAGNOSIS — Z794 Long term (current) use of insulin: Secondary | ICD-10-CM

## 2015-01-12 DIAGNOSIS — G43109 Migraine with aura, not intractable, without status migrainosus: Secondary | ICD-10-CM | POA: Diagnosis not present

## 2015-01-12 DIAGNOSIS — Z85038 Personal history of other malignant neoplasm of large intestine: Secondary | ICD-10-CM

## 2015-01-12 DIAGNOSIS — I2583 Coronary atherosclerosis due to lipid rich plaque: Secondary | ICD-10-CM

## 2015-01-12 MED ORDER — GADOBENATE DIMEGLUMINE 529 MG/ML IV SOLN
18.0000 mL | Freq: Once | INTRAVENOUS | Status: AC | PRN
  Administered 2015-01-12: 18 mL via INTRAVENOUS

## 2015-01-18 ENCOUNTER — Other Ambulatory Visit: Payer: Self-pay | Admitting: Internal Medicine

## 2015-01-19 DIAGNOSIS — E119 Type 2 diabetes mellitus without complications: Secondary | ICD-10-CM | POA: Diagnosis not present

## 2015-01-19 DIAGNOSIS — G43109 Migraine with aura, not intractable, without status migrainosus: Secondary | ICD-10-CM | POA: Diagnosis not present

## 2015-01-19 LAB — HM DIABETES EYE EXAM

## 2015-01-25 ENCOUNTER — Telehealth: Payer: Self-pay | Admitting: Neurology

## 2015-01-25 NOTE — Telephone Encounter (Signed)
I called the patient and explained MRI results, per Dr. Rhea Belton note.

## 2015-01-25 NOTE — Telephone Encounter (Signed)
Please call patient, MRI of the brain showed evidence of mild age related changes, no acute findings,  1. Mild number of T2/FLAIR hyperintense foci in the deep subcortical white matter consistent with age-appropriate microvascular ischemic changes.  2. Brain volume is normal for age. 3. There is a normal enhancement pattern. 4. There are no acute findings.

## 2015-01-26 ENCOUNTER — Encounter: Payer: Self-pay | Admitting: Internal Medicine

## 2015-02-03 ENCOUNTER — Other Ambulatory Visit: Payer: Self-pay | Admitting: Neurology

## 2015-02-03 ENCOUNTER — Other Ambulatory Visit: Payer: Self-pay | Admitting: Internal Medicine

## 2015-02-05 ENCOUNTER — Other Ambulatory Visit: Payer: Self-pay | Admitting: Internal Medicine

## 2015-02-07 NOTE — Telephone Encounter (Signed)
Rx faxed to [facility] pharmacy.  

## 2015-02-07 NOTE — Telephone Encounter (Signed)
Spoke with Pt, she informed me she is taking Lyrica 100 mg 1 tablet at bedtime PRN. Rx printed, awaiting MD signature.

## 2015-02-07 NOTE — Telephone Encounter (Signed)
Please clarify with the patient, how much is she using? One daily? 1 tablet 3 times a day? It is okay with me either way. Refill accordingly

## 2015-02-07 NOTE — Telephone Encounter (Signed)
Pt is requesting refill on Lyrica 100 mg. Sig in chart is 1 tablet by mouth three times daily. Last Rx was written 1 tablet by mouth qhs prn.  Last OV: 11/02/2014 Last Fill: 06/25/2014 #30 and 1RF   Please advise.

## 2015-02-11 ENCOUNTER — Encounter: Payer: Self-pay | Admitting: Physician Assistant

## 2015-02-11 ENCOUNTER — Ambulatory Visit (INDEPENDENT_AMBULATORY_CARE_PROVIDER_SITE_OTHER): Payer: Medicare Other | Admitting: Physician Assistant

## 2015-02-11 VITALS — BP 150/74 | HR 70 | Temp 99.3°F | Ht 62.0 in | Wt 194.2 lb

## 2015-02-11 DIAGNOSIS — J019 Acute sinusitis, unspecified: Secondary | ICD-10-CM

## 2015-02-11 DIAGNOSIS — B9689 Other specified bacterial agents as the cause of diseases classified elsewhere: Secondary | ICD-10-CM | POA: Insufficient documentation

## 2015-02-11 MED ORDER — DOXYCYCLINE HYCLATE 100 MG PO CAPS: 100.0000 mg | ORAL_CAPSULE | Freq: Two times a day (BID) | ORAL | Status: DC

## 2015-02-11 NOTE — Assessment & Plan Note (Signed)
Rx Doxycycline.  Increase fluids.  Rest.  Saline nasal spray.  Probiotic.  Mucinex as directed.  Humidifier in bedroom. Tessalon for cough.  Call or return to clinic if symptoms are not improving.  

## 2015-02-11 NOTE — Patient Instructions (Signed)
Please take antibiotic as directed.  Increase fluid intake.  Use Saline nasal spray.  Take a daily multivitamin. Delsym for cough.  Place a humidifier in the bedroom.  Please call or return clinic if symptoms are not improving.  Sinusitis Sinusitis is redness, soreness, and swelling (inflammation) of the paranasal sinuses. Paranasal sinuses are air pockets within the bones of your face (beneath the eyes, the middle of the forehead, or above the eyes). In healthy paranasal sinuses, mucus is able to drain out, and air is able to circulate through them by way of your nose. However, when your paranasal sinuses are inflamed, mucus and air can become trapped. This can allow bacteria and other germs to grow and cause infection. Sinusitis can develop quickly and last only a short time (acute) or continue over a long period (chronic). Sinusitis that lasts for more than 12 weeks is considered chronic.  CAUSES  Causes of sinusitis include:  Allergies.  Structural abnormalities, such as displacement of the cartilage that separates your nostrils (deviated septum), which can decrease the air flow through your nose and sinuses and affect sinus drainage.  Functional abnormalities, such as when the small hairs (cilia) that line your sinuses and help remove mucus do not work properly or are not present. SYMPTOMS  Symptoms of acute and chronic sinusitis are the same. The primary symptoms are pain and pressure around the affected sinuses. Other symptoms include:  Upper toothache.  Earache.  Headache.  Bad breath.  Decreased sense of smell and taste.  A cough, which worsens when you are lying flat.  Fatigue.  Fever.  Thick drainage from your nose, which often is green and may contain pus (purulent).  Swelling and warmth over the affected sinuses. DIAGNOSIS  Your caregiver will perform a physical exam. During the exam, your caregiver may:  Look in your nose for signs of abnormal growths in your  nostrils (nasal polyps).  Tap over the affected sinus to check for signs of infection.  View the inside of your sinuses (endoscopy) with a special imaging device with a light attached (endoscope), which is inserted into your sinuses. If your caregiver suspects that you have chronic sinusitis, one or more of the following tests may be recommended:  Allergy tests.  Nasal culture A sample of mucus is taken from your nose and sent to a lab and screened for bacteria.  Nasal cytology A sample of mucus is taken from your nose and examined by your caregiver to determine if your sinusitis is related to an allergy. TREATMENT  Most cases of acute sinusitis are related to a viral infection and will resolve on their own within 10 days. Sometimes medicines are prescribed to help relieve symptoms (pain medicine, decongestants, nasal steroid sprays, or saline sprays).  However, for sinusitis related to a bacterial infection, your caregiver will prescribe antibiotic medicines. These are medicines that will help kill the bacteria causing the infection.  Rarely, sinusitis is caused by a fungal infection. In theses cases, your caregiver will prescribe antifungal medicine. For some cases of chronic sinusitis, surgery is needed. Generally, these are cases in which sinusitis recurs more than 3 times per year, despite other treatments. HOME CARE INSTRUCTIONS   Drink plenty of water. Water helps thin the mucus so your sinuses can drain more easily.  Use a humidifier.  Inhale steam 3 to 4 times a day (for example, sit in the bathroom with the shower running).  Apply a warm, moist washcloth to your face 3 to 4   times a day, or as directed by your caregiver.  Use saline nasal sprays to help moisten and clean your sinuses.  Take over-the-counter or prescription medicines for pain, discomfort, or fever only as directed by your caregiver. SEEK IMMEDIATE MEDICAL CARE IF:  You have increasing pain or severe  headaches.  You have nausea, vomiting, or drowsiness.  You have swelling around your face.  You have vision problems.  You have a stiff neck.  You have difficulty breathing. MAKE SURE YOU:   Understand these instructions.  Will watch your condition.  Will get help right away if you are not doing well or get worse. Document Released: 01/15/2005 Document Revised: 04/09/2011 Document Reviewed: 01/30/2011 [employer] Patient Information 2014 [employer].   

## 2015-02-11 NOTE — Progress Notes (Signed)
Patient presents to clinic today c/o 4 days of progressively worsening sinus pressure, sinus pain and cough associated with fatigue. Endorses mucous has become dark green and thick. Has also had some bloody rhinorrhea.Endorses some ear pressure bilateral but denies pain. Denies fever, chest pain or SOB.  Past Medical History  Diagnosis Date  . Hyperlipidemia   . Hypertension   . Chest pain, atypical 11/2006    saw cards: neg/normal Holter, ECHo and myoview, admitted w/ CP 4/09 neg enz.and CT chest had a 9 beat NSVT  . Fibromyalgia   . Abnormal CT of the chest 2008    last CT4-l 2009:  . No f/u suggested   . 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 Shingles 11/2009  . Vaginal dysplasia   . Heart murmur   . Asthma   . H/O hiatal hernia   . GERD (gastroesophageal reflux disease)   . Ocular migraine   . TIA (transient ischemic attack) 06/27/2012    "this is my first" (06/27/2012) and one 2 months later  . Rheumatoid factor positive   . Osteoarthritis   . Reactive airway disease 01/29/2002    dx of pseudoasthma / vcd in 2005 and nl sprirometry History of dyspnea, 2011,  improved after several medications were changed around Question of COPD, disproved July 06, 2009 with nl pft's      . TIA (transient ischemic attack)     x2  . OSA (obstructive sleep apnea) 09/2007    dx w/ a sleep study, not on  CPAP  . Pneumonia     "double" in 2004  . Complication of anesthesia     trouble waking up  . PONV (postoperative nausea and vomiting)   . Torn rotator cuff     right worse than left, both are torn  . CHF (congestive heart failure) (HCC)     ischemia   . Type II diabetes mellitus ([place])     Takes metformin(11/05/14)  . 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 Collagen vascular disease ([place])     "arterial sclerosis" per pt  . CAD (coronary artery disease)     a. Coronary CTA 10/16:  Coronary Ca score 211, mod non-obstructive CAD with LM mild plaque (25-50%), mid LAD 50-70%.  . Palpitations 11/23/2008    Qualifier: Diagnosis of  By: Dawson Bills     . Colon cancer [facility])     Current Outpatient Prescriptions on File Prior to Visit  Medication Sig Dispense Refill  . acetaminophen (TYLENOL) 500 MG tablet Take 1,000 mg by mouth every 6 (six) hours as needed for moderate pain.    [name] albuterol (PROVENTIL HFA;VENTOLIN HFA) 108 (90 BASE) MCG/ACT inhaler Inhale 2 puffs into the lungs every 6 (six) hours as needed for wheezing or shortness of breath.    [name] atorvastatin (LIPITOR) 20 MG tablet Take 1 tablet (20 mg total) by mouth daily. 90 tablet 3  . butalbital-acetaminophen-caffeine (FIORICET) 50-325-40 MG tablet Take 1 tablet by mouth every 6 (six) hours as needed for headache or migraine. 14 tablet 5  . CALCIUM PO Take 1 tablet by mouth every morning.     . cetirizine (ZYRTEC) 10 MG tablet Take 1 tablet (10 mg total) by mouth daily. 90 tablet 1  . cholecalciferol (VITAMIN D) 1000 UNITS tablet Take 1,000 Units by mouth every morning.     [name]  clopidogrel (PLAVIX) 75 MG tablet Take 1 tablet (75 mg total) by mouth daily. 90 tablet 1  . DULoxetine (CYMBALTA) 60 MG capsule TAKE 1 CAPSULE (60 MG TOTAL) BY MOUTH DAILY. 30 capsule 11  . furosemide (LASIX) 20 MG tablet Take 1 tablet (20 mg total) by mouth 2 (two) times daily. 30 tablet 6  . hydrALAZINE (APRESOLINE) 25 MG tablet Take 1 tablet (25 mg total) by mouth 3 (three) times daily. 270 tablet 3  . HYDROcodone-acetaminophen (NORCO/VICODIN) 5-325 MG tablet Take 1-2 tablets by mouth every 6 (six) hours as needed for moderate pain or severe pain. 40 tablet 0  . lisinopril (PRINIVIL,ZESTRIL) 40 MG tablet Take 1 tablet (40 mg total) by mouth daily. 90 tablet 3  . metformin (FORTAMET) 500 MG (OSM) 24 hr tablet Take 1 tablet (500 mg total) by mouth daily with breakfast. 90 tablet 0  . Misc Natural Products (OSTEO BI-FLEX JOINT SHIELD) TABS Take 1  tablet by mouth daily.    . Multiple Vitamin (MULTIVITAMIN) capsule Take 1 capsule by mouth daily.      . nebivolol (BYSTOLIC) 10 MG tablet Take 10 mg by mouth daily. Take 2m by mouth every morning and take 10 mg by mouth every evening    . ondansetron (ZOFRAN ODT) 4 MG disintegrating tablet Take 1 tablet (4 mg total) by mouth every 8 (eight) hours as needed for nausea or vomiting. 12 tablet 1  . Polyethyl Glycol-Propyl Glycol (SYSTANE) 0.4-0.3 % GEL Place 1 drop into both eyes daily as needed (dry eyes).     . Potassium 99 MG TABS Take 99 mg by mouth daily.     . pregabalin (LYRICA) 100 MG capsule Take 1 capsule (100 mg total) by mouth at bedtime as needed. 30 capsule 6  . promethazine (PHENERGAN) 12.5 MG tablet Take 1 tablet (12.5 mg total) by mouth every 6 (six) hours as needed for nausea. 10 tablet 1  . ranitidine (ZANTAC) 300 MG tablet Take 1 tablet (300 mg total) by mouth at bedtime. 90 tablet 1  . topiramate (TOPAMAX) 50 MG tablet Half tablet twice a day for 1 week then 1 tablet twice a day 60 tablet 11   No current facility-administered medications on file prior to visit.    Allergies  Allergen Reactions  . Cefuroxime Axetil     REACTION: urticaria (hives)  . Oxycodone Nausea And Vomiting  . Seldane [Terfenadine]     REACTION: urticaria (hives)  . Zocor [Simvastatin]     2012 "Muscle breakdown " with profuse sweating  . Tramadol     REACTION: vomitting  . Bactrim [Sulfamethoxazole-Trimethoprim]     See OV 09-15-13, rash-tongue swelling due to bactrim ?  . Pravastatin     "muscle breakdown" with profuse sweating    Family History  Problem Relation Age of Onset  . Allergies Sister   . Parkinsonism Sister     possible  . Asthma Sister   . Asthma Paternal Grandmother   . Heart disease Father   . Heart disease Mother   . Lung cancer Mother   . Heart disease      paternal grandparents, maternal grandparents,   . Heart disease Brother   . Emphysema Brother   . Aneurysm  Brother     x3  . Kidney failure Brother   . Diabetes Brother   . Breast cancer Neg Hx   . Colon cancer Neg Hx   . Diabetes Brother   . Heart attack Neg Hx   .  Stroke Brother   . Stroke Maternal Grandmother   . Stroke Paternal Grandmother     Social History   Social History  . Marital Status: Married    Spouse Name: Ilona Sorrel  . Number of Children: 2  . Years of Education: masters   Occupational History  . Retired, disable since 2000    Social History Main Topics  . Smoking status: Former Smoker -- 0.25 packs/day for 5 years    Types: Cigarettes    Quit date: 01/29/1998  . Smokeless tobacco: Never Used     Comment: Quit in 2001  . Alcohol Use: No  . Drug Use: No  . Sexual Activity: No   Other Topics Concern  . None   Social History Narrative   On disability since 2000--- also husband has MS   Education. College.   Right handed.   Review of Systems - See HPI.  All other ROS are negative.  BP 150/74 mmHg  Pulse 70  Temp(Src) 99.3 F (37.4 C) (Oral)  Ht 5' 2" (1.575 m)  Wt 194 lb 3.2 oz (88.089 kg)  BMI 35.51 kg/m2  SpO2 98%  Physical Exam  Constitutional: She is well-developed, well-nourished, and in no distress.  HENT:  Head: Normocephalic and atraumatic.  Right Ear: Tympanic membrane normal.  Left Ear: Tympanic membrane is erythematous and bulging.  Nose: Right sinus exhibits frontal sinus tenderness. Left sinus exhibits frontal sinus tenderness.  Mouth/Throat: Uvula is midline, oropharynx is clear and moist and mucous membranes are normal.  Eyes: Conjunctivae are normal. Pupils are equal, round, and reactive to light.  Neck: Neck supple.  Cardiovascular: Normal rate, regular rhythm, normal heart sounds and intact distal pulses.   Pulmonary/Chest: Effort normal and breath sounds normal.  Skin: Skin is warm and dry. No rash noted.  Psychiatric: Affect normal.  Vitals reviewed.   Recent Results (from the past 2160 hour(s))  Glucose, capillary      Status: Abnormal   Collection Time: 11/13/14  4:44 PM  Result Value Ref Range   Glucose-Capillary 110 (H) 65 - 99 mg/dL  Glucose, capillary     Status: Abnormal   Collection Time: 11/13/14  9:04 PM  Result Value Ref Range   Glucose-Capillary 108 (H) 65 - 99 mg/dL  Basic metabolic panel     Status: Abnormal   Collection Time: 11/14/14  4:50 AM  Result Value Ref Range   Sodium 138 135 - 145 mmol/L   Potassium 3.9 3.5 - 5.1 mmol/L   Chloride 102 101 - 111 mmol/L   CO2 29 22 - 32 mmol/L   Glucose, Bld 124 (H) 65 - 99 mg/dL   BUN 8 6 - 20 mg/dL   Creatinine, Ser 0.92 0.44 - 1.00 mg/dL   Calcium 8.7 (L) 8.9 - 10.3 mg/dL   GFR calc non Af Amer >60 >60 mL/min   GFR calc Af Amer >60 >60 mL/min    Comment: (NOTE) The eGFR has been calculated using the CKD EPI equation. This calculation has not been validated in all clinical situations. eGFR's persistently <60 mL/min signify possible Chronic Kidney Disease.    Anion gap 7 5 - 15  CBC     Status: Abnormal   Collection Time: 11/14/14  4:50 AM  Result Value Ref Range   WBC 4.2 4.0 - 10.5 K/uL   RBC 4.00 3.87 - 5.11 MIL/uL   Hemoglobin 11.5 (L) 12.0 - 15.0 g/dL   HCT 34.5 (L) 36.0 - 46.0 %   MCV 86.3  78.0 - 100.0 fL   MCH 28.8 26.0 - 34.0 pg   MCHC 33.3 30.0 - 36.0 g/dL   RDW 14.6 11.5 - 15.5 %   Platelets 155 150 - 400 K/uL  Glucose, capillary     Status: Abnormal   Collection Time: 11/14/14  7:48 AM  Result Value Ref Range   Glucose-Capillary 105 (H) 65 - 99 mg/dL  Glucose, capillary     Status: Abnormal   Collection Time: 11/14/14 12:11 PM  Result Value Ref Range   Glucose-Capillary 105 (H) 65 - 99 mg/dL  Glucose, capillary     Status: Abnormal   Collection Time: 11/14/14  5:41 PM  Result Value Ref Range   Glucose-Capillary 111 (H) 65 - 99 mg/dL  Glucose, capillary     Status: Abnormal   Collection Time: 11/14/14 10:06 PM  Result Value Ref Range   Glucose-Capillary 100 (H) 65 - 99 mg/dL  Glucose, capillary     Status:  Abnormal   Collection Time: 11/15/14  7:20 AM  Result Value Ref Range   Glucose-Capillary 113 (H) 65 - 99 mg/dL  Glucose, capillary     Status: Abnormal   Collection Time: 11/15/14 11:31 AM  Result Value Ref Range   Glucose-Capillary 106 (H) 65 - 99 mg/dL  Glucose, capillary     Status: None   Collection Time: 11/15/14  5:22 PM  Result Value Ref Range   Glucose-Capillary 87 65 - 99 mg/dL   Comment 1 Notify RN   Glucose, capillary     Status: Abnormal   Collection Time: 11/15/14  9:53 PM  Result Value Ref Range   Glucose-Capillary 122 (H) 65 - 99 mg/dL  Glucose, capillary     Status: Abnormal   Collection Time: 11/16/14  8:14 AM  Result Value Ref Range   Glucose-Capillary 116 (H) 65 - 99 mg/dL  HM DIABETES EYE EXAM     Status: None   Collection Time: 01/19/15 12:00 AM  Result Value Ref Range   HM Diabetic Eye Exam No Retinopathy No Retinopathy    Assessment/Plan: Acute bacterial sinusitis Rx Doxycycline.  Increase fluids.  Rest.  Saline nasal spray.  Probiotic.  Mucinex as directed.  Humidifier in bedroom. Tessalon for cough.  Call or return to clinic if symptoms are not improving.

## 2015-02-11 NOTE — Progress Notes (Signed)
Pre visit review using our clinic review tool, if applicable. No additional management support is needed unless otherwise documented below in the visit note. 

## 2015-03-11 ENCOUNTER — Telehealth: Payer: Self-pay | Admitting: Internal Medicine

## 2015-03-11 ENCOUNTER — Ambulatory Visit: Payer: Medicare Other | Admitting: Internal Medicine

## 2015-03-14 NOTE — Telephone Encounter (Signed)
Pt was scheduled for 03/11/15 10:00am for follow up, pt came in at 10:19am, rescheduled for 03/15/15, charge or no charge?

## 2015-03-14 NOTE — Telephone Encounter (Signed)
No Charge 

## 2015-03-15 ENCOUNTER — Telehealth: Payer: Self-pay | Admitting: Internal Medicine

## 2015-03-15 ENCOUNTER — Ambulatory Visit: Payer: Medicare Other | Admitting: Internal Medicine

## 2015-03-17 ENCOUNTER — Encounter: Payer: Self-pay | Admitting: Internal Medicine

## 2015-03-17 ENCOUNTER — Ambulatory Visit (INDEPENDENT_AMBULATORY_CARE_PROVIDER_SITE_OTHER): Payer: Medicare Other | Admitting: Internal Medicine

## 2015-03-17 VITALS — BP 122/66 | HR 62 | Temp 97.8°F | Ht 62.0 in | Wt 192.2 lb

## 2015-03-17 DIAGNOSIS — M79672 Pain in left foot: Secondary | ICD-10-CM

## 2015-03-17 DIAGNOSIS — I110 Hypertensive heart disease with heart failure: Secondary | ICD-10-CM | POA: Diagnosis not present

## 2015-03-17 DIAGNOSIS — E785 Hyperlipidemia, unspecified: Secondary | ICD-10-CM | POA: Diagnosis not present

## 2015-03-17 DIAGNOSIS — Z23 Encounter for immunization: Secondary | ICD-10-CM

## 2015-03-17 DIAGNOSIS — E1159 Type 2 diabetes mellitus with other circulatory complications: Secondary | ICD-10-CM | POA: Diagnosis not present

## 2015-03-17 DIAGNOSIS — Z09 Encounter for follow-up examination after completed treatment for conditions other than malignant neoplasm: Secondary | ICD-10-CM

## 2015-03-17 LAB — BASIC METABOLIC PANEL
BUN: 20 mg/dL (ref 6–23)
CO2: 31 meq/L (ref 19–32)
CREATININE: 0.93 mg/dL (ref 0.40–1.20)
Calcium: 9.4 mg/dL (ref 8.4–10.5)
Chloride: 103 mEq/L (ref 96–112)
GFR: 76.03 mL/min (ref >60.00)
Glucose, Bld: 104 mg/dL — ABNORMAL HIGH (ref 70–99)
Potassium: 3.9 mEq/L (ref 3.5–5.1)
SODIUM: 138 meq/L (ref 135–145)

## 2015-03-17 LAB — AST: AST: 18 U/L (ref 0–37)

## 2015-03-17 LAB — LIPID PANEL
CHOL/HDL RATIO: 2
Cholesterol: 166 mg/dL (ref 0–200)
HDL: 70.3 mg/dL (ref >39.00)
LDL CALC: 70 mg/dL (ref 0–99)
NonHDL: 95.84
TRIGLYCERIDES: 128 mg/dL (ref 0.0–149.0)
VLDL: 25.6 mg/dL (ref 0.0–40.0)

## 2015-03-17 LAB — ALT: ALT: 17 U/L (ref 0–35)

## 2015-03-17 LAB — HEMOGLOBIN A1C: Hgb A1c MFr Bld: 6.5 % (ref 4.6–6.5)

## 2015-03-17 MED ORDER — ALBUTEROL SULFATE HFA 108 (90 BASE) MCG/ACT IN AERS: 2.0000 | INHALATION_SPRAY | Freq: Four times a day (QID) | RESPIRATORY_TRACT | Status: DC | PRN

## 2015-03-17 MED ORDER — HYDROCORTISONE 2.5 % EX CREA: TOPICAL_CREAM | Freq: Two times a day (BID) | CUTANEOUS | Status: DC

## 2015-03-17 NOTE — Progress Notes (Signed)
Pre visit review using our clinic review tool, if applicable. No additional management support is needed unless otherwise documented below in the visit note. 

## 2015-03-17 NOTE — Progress Notes (Signed)
Subjective:    Patient ID: Samantha [name], female    DOB: Feb 09, 1940, 75 y.o.   MRN: [id number]  DOS:  03/17/2015 Type of visit - description : In office visit Interval history: Colon cancer: Status post surgery, still has some incisional pain and mild chronic diarrhea. Complaining of left foot pain, plantar, no burning or worse at night. Increase by walking. No symptoms on the right side. Rash, right ankle, not better after several weeks. DM: Good compliance of medication, ambulatory CBGs 90, 110. HTN: BPs when checked within normal   Review of Systems   denies chest or difficulty breathing. No nausea vomiting. Past Medical History  Diagnosis Date  . Hyperlipidemia   . Hypertension   . Chest pain, atypical 11/2006    saw cards: neg/normal Holter, ECHo and myoview, admitted w/ CP 4/09 neg enz.and CT chest had a 9 beat NSVT  . Fibromyalgia   . Abnormal CT of the chest 2008    last CT4-l 2009:  . No f/u suggested   . 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 Shingles 11/2009  . Vaginal dysplasia   . Heart murmur   . Asthma   . H/O hiatal hernia   . GERD (gastroesophageal reflux disease)   . Ocular migraine   . TIA (transient ischemic attack) 06/27/2012    "this is my first" (06/27/2012) and one 2 months later  . Rheumatoid factor positive   . Osteoarthritis   . Reactive airway disease 01/29/2002    dx of pseudoasthma / vcd in 2005 and nl sprirometry History of dyspnea, 2011,  improved after several medications were changed around Question of COPD, disproved July 06, 2009 with nl pft's      . TIA (transient ischemic attack)     x2  . OSA (obstructive sleep apnea) 09/2007    dx w/ a sleep study, not on  CPAP  . Pneumonia     "double" in 2004  . Complication of anesthesia     trouble waking up  . PONV (postoperative nausea and vomiting)   . Torn rotator cuff     right worse than left, both are torn  . CHF (congestive heart failure) (HCC)     ischemia   .  Type II diabetes mellitus ([place])     Takes metformin(11/05/14)  . 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 Malignant neoplasm of ascending colon ([place]) 2016    Minimally invasive right hemicolectomy to be done   . Collagen vascular disease ([place])     "arterial sclerosis" per pt  . CAD (coronary artery disease)     a. Coronary CTA 10/16: Coronary Ca score 211, mod non-obstructive CAD with LM mild plaque (25-50%), mid LAD 50-70%.  . Palpitations 11/23/2008    Qualifier: Diagnosis of  By: Dawson Bills     . Colon cancer [facility])     Past Surgical History  Procedure Laterality Date  . Thyroidectomy, partial  1960's  . Bunionectomy Left ~ 1977  . Breast biopsy Right 1999  . Anterior cervical decomp/discectomy fusion  2001    C 3, C4 and C5 plate and screws  . Cataract extraction w/ intraocular lens  implant, bilateral  2012  . Vaginal mass excision  1994    "Laser surgery for vaginal cancer; followed by chemotherapy" (06/27/2012)  . Abdominal hysterectomy  1980    NO oophorectomy per pt   . Eye surgery Bilateral     torq lens  for cataracts  . Colon surgery  10/2014    Social History   Social History  . Marital Status: Married    Spouse Name: Ilona Sorrel  . Number of Children: 2  . Years of Education: masters   Occupational History  . Retired, disable since 2000    Social History Main Topics  . Smoking status: Former Smoker -- 0.25 packs/day for 5 years    Types: Cigarettes    Quit date: 01/29/1998  . Smokeless tobacco: Never Used     Comment: Quit in 2001  . Alcohol Use: No  . Drug Use: No  . Sexual Activity: No   Other Topics Concern  . Not on file   Social History Narrative   On disability since 2000--- also husband has MS   Education. College.   Right handed.        Medication List       This list is accurate as of: 03/17/15  5:01 PM.  Always use your most recent med list.               acetaminophen 500 MG tablet  Commonly known as:  TYLENOL  Take 1,000 mg by mouth  every 6 (six) hours as needed for moderate pain.     albuterol 108 (90 Base) MCG/ACT inhaler  Commonly known as:  PROVENTIL HFA;VENTOLIN HFA  Inhale 2 puffs into the lungs every 6 (six) hours as needed for wheezing or shortness of breath.     atorvastatin 20 MG tablet  Commonly known as:  LIPITOR  Take 1 tablet (20 mg total) by mouth daily.     butalbital-acetaminophen-caffeine 50-325-40 MG tablet  Commonly known as:  FIORICET  Take 1 tablet by mouth every 6 (six) hours as needed for headache or migraine.     CALCIUM PO  Take 1 tablet by mouth every morning.     cetirizine 10 MG tablet  Commonly known as:  ZYRTEC  Take 1 tablet (10 mg total) by mouth daily.     cholecalciferol 1000 units tablet  Commonly known as:  VITAMIN D  Take 1,000 Units by mouth every morning.     CITRUCEL 500 MG Tabs  Generic drug:  Methylcellulose (Laxative)  Take 1 tablet by mouth daily.     clopidogrel 75 MG tablet  Commonly known as:  PLAVIX  Take 1 tablet (75 mg total) by mouth daily.     DULoxetine 60 MG capsule  Commonly known as:  CYMBALTA  TAKE 1 CAPSULE (60 MG TOTAL) BY MOUTH DAILY.     furosemide 20 MG tablet  Commonly known as:  LASIX  Take 1 tablet (20 mg total) by mouth 2 (two) times daily.     hydrALAZINE 25 MG tablet  Commonly known as:  APRESOLINE  Take 1 tablet (25 mg total) by mouth 3 (three) times daily.     hydrocortisone 2.5 % cream  Apply topically 2 (two) times daily.     lisinopril 40 MG tablet  Commonly known as:  PRINIVIL,ZESTRIL  Take 1 tablet (40 mg total) by mouth daily.     metformin 500 MG (OSM) 24 hr tablet  Commonly known as:  FORTAMET  Take 1 tablet (500 mg total) by mouth daily with breakfast.     multivitamin capsule  Take 1 capsule by mouth daily.     nebivolol 10 MG tablet  Commonly known as:  BYSTOLIC  Take 10 mg by mouth daily. Take 20mg  by mouth every morning and take  10 mg by mouth every evening     ondansetron 4 MG disintegrating tablet    Commonly known as:  ZOFRAN ODT  Take 1 tablet (4 mg total) by mouth every 8 (eight) hours as needed for nausea or vomiting.     OSTEO BI-FLEX JOINT SHIELD Tabs  Take 1 tablet by mouth daily.     Potassium 99 MG Tabs  Take 99 mg by mouth daily.     pregabalin 100 MG capsule  Commonly known as:  LYRICA  Take 1 capsule (100 mg total) by mouth at bedtime as needed.     promethazine 12.5 MG tablet  Commonly known as:  PHENERGAN  Take 1 tablet (12.5 mg total) by mouth every 6 (six) hours as needed for nausea.     ranitidine 300 MG tablet  Commonly known as:  ZANTAC  Take 1 tablet (300 mg total) by mouth at bedtime.     SYSTANE 0.4-0.3 % Gel ophthalmic gel  Generic drug:  Polyethyl Glycol-Propyl Glycol  Place 1 drop into both eyes daily as needed (dry eyes).     topiramate 50 MG tablet  Commonly known as:  TOPAMAX  Half tablet twice a day for 1 week then 1 tablet twice a day           Objective:   Physical Exam BP 122/66 mmHg  Pulse 62  Temp(Src) 97.8 F (36.6 C) (Oral)  Ht 5\' 2"  (1.575 m)  Wt 192 lb 4 oz (87.204 kg)  BMI 35.15 kg/m2  SpO2 97% General:   Well developed, well nourished . NAD.  HEENT:  Normocephalic . Face symmetric, atraumatic Lungs:  CTA B Normal respiratory effort, no intercostal retractions, no accessory muscle use. Heart: RRR,  no murmur.  No pretibial edema bilaterally  Skin: Not pale. Not jaundice. Has dry superficial crust at the external side of the right ankle. Diabetic feet exam: Normal pedal pulses, skin and pinprick examination. MSK: Left foot with changes consistent with previous bunionectomy. Slightly TTP at the plantar area/arch. Neurologic:  alert & oriented X3.  Speech normal, gait appropriate for age and unassisted Psych--  Cognition and judgment appear intact.  Cooperative with normal attention span and concentration.  Behavior appropriate. No anxious or depressed appearing.      Assessment & Plan:  Assessment > DM w/  CAD, CHF  HTN with CHF  Hyperlipidemia Asthma Colon Ca dx 07-2024, s/p surgery, no chemo or XRT CV --CHF --CAD -moderate, nonobstructive --PVCs  --TIA 05-2012 , saw neuro, ASA changed to plavix OSA - Cpap intolerant   MSK: --DJD --Fibromyalgia, used to see rheumatology --h/o cspine surgery H/o ocular migraine H/o vaginal cancer 1994  PLAN:  DM: Feet exam negative today, continue metformin, check A1c HTN: Seems well-controlled, check a BMP, continue lisinopril, bistolic, hydralazine, Lasix High cholesterol: Lipitor dose increased by cardiology December 2016, due for labs. Check a FLP, AST, ALT CHF: Cardiology note reviewed from December, she seems a stable Colon cancer: Tolerated surgery well, having issues with diarrhea, reassess on RTC Foot pain: I suspect these MSK no neuropathy. Referred to sports medicine Rash: Chronic rash near the right ankle, I don't see mole or skin ulcer. Trial with steroids topically, if not better will go to dermatology Primary care: Flu shot  RTC 4 months

## 2015-03-17 NOTE — Patient Instructions (Signed)
GO TO THE LAB : Get the blood work    GO TO THE FRONT DESK  Schedule a routine office visit or check up to be done in  4 months, no fasting  Front desk:  30   Use the cream as recommended, if not better in 3-4 weeks please let me know

## 2015-03-17 NOTE — Assessment & Plan Note (Signed)
DM: Feet exam negative today, continue metformin, check A1c HTN: Seems well-controlled, check a BMP, continue lisinopril, bistolic, hydralazine, Lasix High cholesterol: Lipitor dose increased by cardiology December 2016, due for labs. Check a FLP, AST, ALT CHF: Cardiology note reviewed from December, she seems a stable Colon cancer: Tolerated surgery well, having issues with diarrhea, reassess on RTC Foot pain: I suspect these MSK no neuropathy. Referred to sports medicine Rash: Chronic rash near the right ankle, I don't see mole or skin ulcer. Trial with steroids topically, if not better will go to dermatology Primary care: Flu shot  RTC 4 months

## 2015-03-21 ENCOUNTER — Encounter: Payer: Self-pay | Admitting: Family Medicine

## 2015-03-21 ENCOUNTER — Ambulatory Visit (INDEPENDENT_AMBULATORY_CARE_PROVIDER_SITE_OTHER): Payer: Medicare Other | Admitting: Family Medicine

## 2015-03-21 VITALS — BP 145/88 | HR 73 | Ht 62.0 in | Wt 192.0 lb

## 2015-03-21 DIAGNOSIS — M722 Plantar fascial fibromatosis: Secondary | ICD-10-CM | POA: Diagnosis not present

## 2015-03-21 MED ORDER — HYDROCODONE-ACETAMINOPHEN 5-325 MG PO TABS: 1.0000 | ORAL_TABLET | Freq: Four times a day (QID) | ORAL | Status: DC | PRN

## 2015-03-21 MED ORDER — MELOXICAM 15 MG PO TABS: 15.0000 mg | ORAL_TABLET | Freq: Every day | ORAL | Status: DC

## 2015-03-21 NOTE — Patient Instructions (Addendum)
You have plantar fasciitis Meloxicam 15mg  daily with food for pain and inflammation. Norco as needed for severe pain. Plantar fascia stretch for 20-30 seconds (do 3 of these) in morning Lowering/raise on a step exercises 3 x 10 once or twice a day - this is very important for long term recovery - start by just doing calf raises on level ground first. Can add heel walks, toe walks forward and backward as well Ice heel for 15 minutes as needed. Avoid flat shoes/barefoot walking as much as possible. Arch straps have been shown to help with pain. Green sports insoles - we can add extra padding to this if you're not getting enough arch support. Physical therapy is also an option. Follow up with me in 1 month for reevaluation.

## 2015-03-21 NOTE — Telephone Encounter (Signed)
Pt was late for appt 03/15/15 (and 03/11/15) - pt came in 03/17/15 - charge or no charge for late 03/15/15?

## 2015-03-21 NOTE — Telephone Encounter (Signed)
No Charge 

## 2015-03-22 DIAGNOSIS — M722 Plantar fascial fibromatosis: Secondary | ICD-10-CM | POA: Insufficient documentation

## 2015-03-22 NOTE — Assessment & Plan Note (Signed)
Shown home exercises and stretches to do daily.  Sports insoles provided for extra arch support.  Meloxicam with norco as needed for severe pain.  Icing.  Consider physical therapy if not improving.  F/u in 1 month.

## 2015-03-22 NOTE — Progress Notes (Signed)
PCP and consultation requested by: Kathlene November, MD  Subjective:   HPI: Patient is a 75 y.o. female here for left foot pain.  Patient reports she has had about 2 months of plantar left foot pain. Remote issues with this foot - had bunion surgery 40 years ago that was botched. Current pain is 4/10, sharp. Worse with ambulation, prolonged standing, and at nighttime. Tried tylenol and takes lyrica. Tried some OTC inserts. Occasional swelling. No skin changes, fever, other complaints.  Past Medical History  Diagnosis Date  . Hyperlipidemia   . Hypertension   . Chest pain, atypical 11/2006    saw cards: neg/normal Holter, ECHo and myoview, admitted w/ CP 4/09 neg enz.and CT chest had a 9 beat NSVT  . Fibromyalgia   . Abnormal CT of the chest 2008    last CT4-l 2009:  . No f/u suggested   . 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 Shingles 11/2009  . Vaginal dysplasia   . Heart murmur   . Asthma   . H/O hiatal hernia   . GERD (gastroesophageal reflux disease)   . Ocular migraine   . TIA (transient ischemic attack) 06/27/2012    "this is my first" (06/27/2012) and one 2 months later  . Rheumatoid factor positive   . Osteoarthritis   . Reactive airway disease 01/29/2002    dx of pseudoasthma / vcd in 2005 and nl sprirometry History of dyspnea, 2011,  improved after several medications were changed around Question of COPD, disproved July 06, 2009 with nl pft's      . TIA (transient ischemic attack)     x2  . OSA (obstructive sleep apnea) 09/2007    dx w/ a sleep study, not on  CPAP  . Pneumonia     "double" in 2004  . Complication of anesthesia     trouble waking up  . PONV (postoperative nausea and vomiting)   . Torn rotator cuff     right worse than left, both are torn  . CHF (congestive heart failure) (HCC)     ischemia   . Type II diabetes mellitus ([place])     Takes metformin(11/05/14)  . 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 Collagen vascular disease ([place])     "arterial sclerosis" per pt  . CAD (coronary artery disease)     a. Coronary CTA 10/16: Coronary Ca score 211, mod non-obstructive CAD with LM mild plaque (25-50%), mid LAD 50-70%.  . Palpitations 11/23/2008    Qualifier: Diagnosis of  By: Dawson Bills     . Colon cancer [facility])     Current Outpatient Prescriptions on File Prior to Visit  Medication Sig Dispense Refill  . acetaminophen (TYLENOL) 500 MG tablet Take 1,000 mg by mouth every 6 (six) hours as needed for moderate pain.    [name] albuterol (PROVENTIL HFA;VENTOLIN HFA) 108 (90 Base) MCG/ACT inhaler Inhale 2 puffs into the lungs every 6 (six) hours as needed for wheezing or shortness of breath. 18 g 3  . atorvastatin (LIPITOR) 20 MG tablet Take 1 tablet (20 mg total) by mouth daily. 90 tablet 3  . butalbital-acetaminophen-caffeine (FIORICET) 50-325-40 MG tablet Take 1 tablet by mouth every 6 (six) hours as needed for headache or migraine. 14 tablet 5  . CALCIUM PO Take 1 tablet by mouth every morning.     [name]  cetirizine (ZYRTEC) 10 MG tablet Take 1 tablet (10 mg total) by mouth daily. 90 tablet 1  . cholecalciferol (VITAMIN D) 1000 UNITS tablet Take 1,000 Units by mouth every morning.     . clopidogrel (PLAVIX) 75 MG tablet Take 1 tablet (75 mg total) by mouth daily. 90 tablet 1  . DULoxetine (CYMBALTA) 60 MG capsule TAKE 1 CAPSULE (60 MG TOTAL) BY MOUTH DAILY. 30 capsule 11  . furosemide (LASIX) 20 MG tablet Take 1 tablet (20 mg total) by mouth 2 (two) times daily. 30 tablet 6  . hydrALAZINE (APRESOLINE) 25 MG tablet Take 1 tablet (25 mg total) by mouth 3 (three) times daily. 270 tablet 3  . hydrocortisone 2.5 % cream Apply topically 2 (two) times daily. 30 g 0  . lisinopril (PRINIVIL,ZESTRIL) 40 MG tablet Take 1 tablet (40 mg total) by mouth daily. 90 tablet 3  . metformin (FORTAMET) 500 MG (OSM) 24 hr tablet Take 1 tablet (500 mg total) by  mouth daily with breakfast. 90 tablet 0  . Methylcellulose, Laxative, (CITRUCEL) 500 MG TABS Take 1 tablet by mouth daily.    . Misc Natural Products (OSTEO BI-FLEX JOINT SHIELD) TABS Take 1 tablet by mouth daily.    . Multiple Vitamin (MULTIVITAMIN) capsule Take 1 capsule by mouth daily.      . nebivolol (BYSTOLIC) 10 MG tablet Take 10 mg by mouth daily. Take 20mg  by mouth every morning and take 10 mg by mouth every evening    . ondansetron (ZOFRAN ODT) 4 MG disintegrating tablet Take 1 tablet (4 mg total) by mouth every 8 (eight) hours as needed for nausea or vomiting. 12 tablet 1  . Polyethyl Glycol-Propyl Glycol (SYSTANE) 0.4-0.3 % GEL Place 1 drop into both eyes daily as needed (dry eyes).     . Potassium 99 MG TABS Take 99 mg by mouth daily.     . pregabalin (LYRICA) 100 MG capsule Take 1 capsule (100 mg total) by mouth at bedtime as needed. 30 capsule 6  . promethazine (PHENERGAN) 12.5 MG tablet Take 1 tablet (12.5 mg total) by mouth every 6 (six) hours as needed for nausea. 10 tablet 1  . ranitidine (ZANTAC) 300 MG tablet Take 1 tablet (300 mg total) by mouth at bedtime. 90 tablet 1  . topiramate (TOPAMAX) 50 MG tablet Half tablet twice a day for 1 week then 1 tablet twice a day 60 tablet 11   No current facility-administered medications on file prior to visit.    Past Surgical History  Procedure Laterality Date  . Thyroidectomy, partial  1960's  . Bunionectomy Left ~ 1977  . Breast biopsy Right 1999  . Anterior cervical decomp/discectomy fusion  2001    C 3, C4 and C5 plate and screws  . Cataract extraction w/ intraocular lens  implant, bilateral  2012  . Vaginal mass excision  1994    "Laser surgery for vaginal cancer; followed by chemotherapy" (06/27/2012)  . Abdominal hysterectomy  1980    NO oophorectomy per pt   . Eye surgery Bilateral     torq lens for cataracts  . Colon surgery  10/2014    Allergies  Allergen Reactions  . Cefuroxime Axetil     REACTION: urticaria  (hives)  . Oxycodone Nausea And Vomiting  . Seldane [Terfenadine]     REACTION: urticaria (hives)  . Zocor [Simvastatin]     2012 "Muscle breakdown " with profuse sweating  . Tramadol     REACTION: vomitting  . Bactrim [  Sulfamethoxazole-Trimethoprim]     See OV 09-15-13, rash-tongue swelling due to bactrim ?  . Pravastatin     "muscle breakdown" with profuse sweating    Social History   Social History  . Marital Status: Married    Spouse Name: Ilona Sorrel  . Number of Children: 2  . Years of Education: masters   Occupational History  . Retired, disable since 2000    Social History Main Topics  . Smoking status: Former Smoker -- 0.25 packs/day for 5 years    Types: Cigarettes    Quit date: 01/29/1998  . Smokeless tobacco: Never Used     Comment: Quit in 2001  . Alcohol Use: No  . Drug Use: No  . Sexual Activity: No   Other Topics Concern  . Not on file   Social History Narrative   On disability since 2000--- also husband has MS   Education. College.   Right handed.    Family History  Problem Relation Age of Onset  . Allergies Sister   . Parkinsonism Sister     possible  . Asthma Sister   . Asthma Paternal Grandmother   . Heart disease Father   . Heart disease Mother   . Lung cancer Mother   . Heart disease      paternal grandparents, maternal grandparents,   . Heart disease Brother   . Emphysema Brother   . Aneurysm Brother     x3  . Kidney failure Brother   . Diabetes Brother   . Breast cancer Neg Hx   . Colon cancer Neg Hx   . Diabetes Brother   . Heart attack Neg Hx   . Stroke Brother   . Stroke Maternal Grandmother   . Stroke Paternal Grandmother     BP 145/88 mmHg  Pulse 73  Ht 5\' 2"  (1.575 m)  Wt 192 lb (87.091 kg)  BMI 35.11 kg/m2  Review of Systems: See HPI above.    Objective:  Physical Exam:  Gen: NAD  Left foot/ankle: Dorsal scarring over 1st MTP.  Spurring over TMT joint.  No bruising, other deformity.  Overpronation. FROM  ankle without pain. TTP plantar fascia within body of this.  Mild TMT tenderness dorsally.  No other tenderness foot or ankle. Negative ant drawer and talar tilt.   Negative syndesmotic compression. Negative calcaneal squeeze. Thompsons test negative. NV intact distally.  Right foot/ankle: FROM without pain.    Assessment & Plan:  1. Left plantar fasciitis - Shown home exercises and stretches to do daily.  Sports insoles provided for extra arch support.  Meloxicam with norco as needed for severe pain.  Icing.  Consider physical therapy if not improving.  F/u in 1 month.

## 2015-04-11 ENCOUNTER — Ambulatory Visit (INDEPENDENT_AMBULATORY_CARE_PROVIDER_SITE_OTHER): Payer: Medicare Other | Admitting: Adult Health

## 2015-04-11 ENCOUNTER — Encounter: Payer: Self-pay | Admitting: Adult Health

## 2015-04-11 VITALS — BP 146/76 | HR 63 | Resp 16 | Ht 62.0 in | Wt 192.0 lb

## 2015-04-11 DIAGNOSIS — G43009 Migraine without aura, not intractable, without status migrainosus: Secondary | ICD-10-CM | POA: Diagnosis not present

## 2015-04-11 DIAGNOSIS — M542 Cervicalgia: Secondary | ICD-10-CM

## 2015-04-11 NOTE — Patient Instructions (Signed)
Continue Cymbalta and topamax If headache frequency increases then let us know If your symptoms worsen or you develop new symptoms please let us know.
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
## 2015-04-11 NOTE — Progress Notes (Signed)
I have reviewed and agreed above plan. 

## 2015-04-16 ENCOUNTER — Other Ambulatory Visit: Payer: Self-pay | Admitting: Internal Medicine

## 2015-04-18 ENCOUNTER — Ambulatory Visit (INDEPENDENT_AMBULATORY_CARE_PROVIDER_SITE_OTHER): Payer: Medicare Other | Admitting: Family Medicine

## 2015-04-18 ENCOUNTER — Encounter: Payer: Self-pay | Admitting: Family Medicine

## 2015-04-18 VITALS — BP 160/92 | HR 62 | Ht 62.0 in | Wt 192.0 lb

## 2015-04-18 DIAGNOSIS — M722 Plantar fascial fibromatosis: Secondary | ICD-10-CM

## 2015-04-18 NOTE — Patient Instructions (Signed)
Continue the inserts, arch binder, exercises for 6 more weeks. Follow up with me as needed.

## 2015-04-21 NOTE — Assessment & Plan Note (Signed)
Significantly improved.  Continue with inserts, arch binder, home exercises for 6 more weeks.  F/u prn.

## 2015-04-21 NOTE — Progress Notes (Signed)
PCP and consultation requested by: Kathlene November, MD  Subjective:   HPI: Patient is a 75 y.o. female here for left foot pain.  2/20: Patient reports she has had about 2 months of plantar left foot pain. Remote issues with this foot - had bunion surgery 40 years ago that was botched. Current pain is 4/10, sharp. Worse with ambulation, prolonged standing, and at nighttime. Tried tylenol and takes lyrica. Tried some OTC inserts. Occasional swelling. No skin changes, fever, other complaints.  3/20: Patient reports she feels significantly better. Using arch binders, sports insoles. Doing stretches and home exercises. Stopped meloxicam now but this did help. No skin changes, fever.  Past Medical History  Diagnosis Date  . Hyperlipidemia   . Hypertension   . Chest pain, atypical 11/2006    saw cards: neg/normal Holter, ECHo and myoview, admitted w/ CP 4/09 neg enz.and CT chest had a 9 beat NSVT  . Fibromyalgia   . Abnormal CT of the chest 2008    last CT4-l 2009:  . No f/u suggested   . 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 Shingles 11/2009  . Vaginal dysplasia   . Heart murmur   . Asthma   . H/O hiatal hernia   . GERD (gastroesophageal reflux disease)   . Ocular migraine   . TIA (transient ischemic attack) 06/27/2012    "this is my first" (06/27/2012) and one 2 months later  . Rheumatoid factor positive   . Osteoarthritis   . Reactive airway disease 01/29/2002    dx of pseudoasthma / vcd in 2005 and nl sprirometry History of dyspnea, 2011,  improved after several medications were changed around Question of COPD, disproved July 06, 2009 with nl pft's      . TIA (transient ischemic attack)     x2  . OSA (obstructive sleep apnea) 09/2007    dx w/ a sleep study, not on  CPAP  . Pneumonia     "double" in 2004  . Complication of anesthesia     trouble waking up  . PONV (postoperative nausea and vomiting)   . Torn rotator cuff     right worse than left, both are  torn  . CHF (congestive heart failure) (HCC)     ischemia   . Type II diabetes mellitus ([place])     Takes metformin(11/05/14)  . 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 Collagen vascular disease ([place])     "arterial sclerosis" per pt  . CAD (coronary artery disease)     a. Coronary CTA 10/16: Coronary Ca score 211, mod non-obstructive CAD with LM mild plaque (25-50%), mid LAD 50-70%.  . Palpitations 11/23/2008    Qualifier: Diagnosis of  By: Dawson Bills     . Colon cancer [facility])     Current Outpatient Prescriptions on File Prior to Visit  Medication Sig Dispense Refill  . acetaminophen (TYLENOL) 500 MG tablet Take 1,000 mg by mouth every 6 (six) hours as needed for moderate pain.    [name] albuterol (PROVENTIL HFA;VENTOLIN HFA) 108 (90 Base) MCG/ACT inhaler Inhale 2 puffs into the lungs every 6 (six) hours as needed for wheezing or shortness of breath. 18 g 3  . atorvastatin (LIPITOR) 20 MG tablet Take 1 tablet (20 mg total) by mouth daily. 90 tablet 3  . butalbital-acetaminophen-caffeine (FIORICET) 50-325-40 MG tablet Take 1  tablet by mouth every 6 (six) hours as needed for headache or migraine. (Patient not taking: Reported on 04/11/2015) 14 tablet 5  . CALCIUM PO Take 1 tablet by mouth every morning.     . cetirizine (ZYRTEC) 10 MG tablet Take 1 tablet (10 mg total) by mouth daily. 90 tablet 1  . cholecalciferol (VITAMIN D) 1000 UNITS tablet Take 1,000 Units by mouth every morning.     . clopidogrel (PLAVIX) 75 MG tablet Take 1 tablet (75 mg total) by mouth daily. 90 tablet 1  . DULoxetine (CYMBALTA) 60 MG capsule TAKE 1 CAPSULE (60 MG TOTAL) BY MOUTH DAILY. 30 capsule 11  . furosemide (LASIX) 20 MG tablet Take 1 tablet (20 mg total) by mouth 2 (two) times daily. 30 tablet 6  . hydrALAZINE (APRESOLINE) 25 MG tablet Take 1 tablet (25 mg total) by mouth 3 (three) times daily. 270 tablet 3  .  HYDROcodone-acetaminophen (NORCO) 5-325 MG tablet Take 1 tablet by mouth every 6 (six) hours as needed for moderate pain. 40 tablet 0  . hydrocortisone 2.5 % cream Apply topically 2 (two) times daily. 30 g 0  . lisinopril (PRINIVIL,ZESTRIL) 40 MG tablet Take 1 tablet (40 mg total) by mouth daily. 90 tablet 3  . meloxicam (MOBIC) 15 MG tablet Take 1 tablet (15 mg total) by mouth daily. 30 tablet 0  . metformin (FORTAMET) 500 MG (OSM) 24 hr tablet Take 1 tablet (500 mg total) by mouth daily with breakfast. 90 tablet 1  . Misc Natural Products (OSTEO BI-FLEX JOINT SHIELD) TABS Take 1 tablet by mouth daily.    . Multiple Vitamin (MULTIVITAMIN) capsule Take 1 capsule by mouth daily.      . nebivolol (BYSTOLIC) 10 MG tablet Take 10 mg by mouth daily. Take 20mg  by mouth every morning and take 10 mg by mouth every evening    . ondansetron (ZOFRAN ODT) 4 MG disintegrating tablet Take 1 tablet (4 mg total) by mouth every 8 (eight) hours as needed for nausea or vomiting. 12 tablet 1  . Polyethyl Glycol-Propyl Glycol (SYSTANE) 0.4-0.3 % GEL Place 1 drop into both eyes daily as needed (dry eyes).     . Potassium 99 MG TABS Take 99 mg by mouth daily.     . pregabalin (LYRICA) 100 MG capsule Take 1 capsule (100 mg total) by mouth at bedtime as needed. 30 capsule 6  . promethazine (PHENERGAN) 12.5 MG tablet Take 1 tablet (12.5 mg total) by mouth every 6 (six) hours as needed for nausea. 10 tablet 1  . ranitidine (ZANTAC) 300 MG tablet Take 1 tablet (300 mg total) by mouth at bedtime. 90 tablet 1  . topiramate (TOPAMAX) 50 MG tablet Half tablet twice a day for 1 week then 1 tablet twice a day 60 tablet 11   No current facility-administered medications on file prior to visit.    Past Surgical History  Procedure Laterality Date  . Thyroidectomy, partial  1960's  . Bunionectomy Left ~ 1977  . Breast biopsy Right 1999  . Anterior cervical decomp/discectomy fusion  2001    C 3, C4 and C5 plate and screws  .  Cataract extraction w/ intraocular lens  implant, bilateral  2012  . Vaginal mass excision  1994    "Laser surgery for vaginal cancer; followed by chemotherapy" (06/27/2012)  . Abdominal hysterectomy  1980    NO oophorectomy per pt   . Eye surgery Bilateral     torq lens for cataracts  . Colon  surgery  10/2014    Allergies  Allergen Reactions  . Cefuroxime Axetil     REACTION: urticaria (hives)  . Oxycodone Nausea And Vomiting  . Seldane [Terfenadine]     REACTION: urticaria (hives)  . Zocor [Simvastatin]     2012 "Muscle breakdown " with profuse sweating  . Tramadol     REACTION: vomitting  . Bactrim [Sulfamethoxazole-Trimethoprim]     See OV 09-15-13, rash-tongue swelling due to bactrim ?  . Pravastatin     "muscle breakdown" with profuse sweating    Social History   Social History  . Marital Status: Married    Spouse Name: Ilona Sorrel  . Number of Children: 2  . Years of Education: masters   Occupational History  . Retired, disable since 2000    Social History Main Topics  . Smoking status: Former Smoker -- 0.25 packs/day for 5 years    Types: Cigarettes    Quit date: 01/29/1998  . Smokeless tobacco: Never Used     Comment: Quit in 2001  . Alcohol Use: No  . Drug Use: No  . Sexual Activity: No   Other Topics Concern  . Not on file   Social History Narrative   On disability since 2000--- also husband has MS   Education. College.   Right handed.    Family History  Problem Relation Age of Onset  . Allergies Sister   . Parkinsonism Sister     possible  . Asthma Sister   . Asthma Paternal Grandmother   . Heart disease Father   . Heart disease Mother   . Lung cancer Mother   . Heart disease      paternal grandparents, maternal grandparents,   . Heart disease Brother   . Emphysema Brother   . Aneurysm Brother     x3  . Kidney failure Brother   . Diabetes Brother   . Breast cancer Neg Hx   . Colon cancer Neg Hx   . Diabetes Brother   . Heart attack  Neg Hx   . Stroke Brother   . Stroke Maternal Grandmother   . Stroke Paternal Grandmother     BP 160/92 mmHg  Pulse 62  Ht 5\' 2"  (1.575 m)  Wt 192 lb (87.091 kg)  BMI 35.11 kg/m2  Review of Systems: See HPI above.    Objective:  Physical Exam:  Gen: NAD  Left foot/ankle: Dorsal scarring over 1st MTP.  Spurring over TMT joint.  No bruising, other deformity.  Overpronation. FROM ankle without pain. No TTP plantar fascia, TMT.  No other tenderness foot or ankle. Negative ant drawer and talar tilt.   Negative syndesmotic compression. Thompsons test negative. NV intact distally.  Right foot/ankle: FROM without pain.    Assessment & Plan:  1. Left plantar fasciitis - Significantly improved.  Continue with inserts, arch binder, home exercises for 6 more weeks.  F/u prn.

## 2015-04-25 ENCOUNTER — Telehealth: Payer: Self-pay | Admitting: Adult Health

## 2015-04-25 NOTE — Telephone Encounter (Signed)
LVM for pt to call. Gave [facility] phone number 

## 2015-04-25 NOTE — Telephone Encounter (Signed)
Pt called sts topamax is the medication she had allergic reaction to. Megan requested she call.

## 2015-04-25 NOTE — Telephone Encounter (Signed)
Samantha [name] can you call and verify that she is NOT taking the topamax?

## 2015-04-26 NOTE — Telephone Encounter (Signed)
Noted  

## 2015-04-26 NOTE — Telephone Encounter (Signed)
Pt called office back. She verified she is NOT taking topamax. Removed from pt med list. She stated it made her "drunk and nauseous". Told her to call back if she has further questions or new/worseing sx. She verbalized understanding.

## 2015-04-30 ENCOUNTER — Other Ambulatory Visit: Payer: Self-pay | Admitting: Internal Medicine

## 2015-06-03 ENCOUNTER — Telehealth: Payer: Self-pay | Admitting: Gastroenterology

## 2015-06-03 ENCOUNTER — Other Ambulatory Visit (INDEPENDENT_AMBULATORY_CARE_PROVIDER_SITE_OTHER): Payer: Medicare Other

## 2015-06-03 ENCOUNTER — Other Ambulatory Visit: Payer: Self-pay

## 2015-06-03 DIAGNOSIS — R197 Diarrhea, unspecified: Secondary | ICD-10-CM

## 2015-06-03 DIAGNOSIS — A09 Infectious gastroenteritis and colitis, unspecified: Secondary | ICD-10-CM | POA: Diagnosis not present

## 2015-06-03 LAB — COMPREHENSIVE METABOLIC PANEL
ALT: 15 U/L (ref 0–35)
AST: 15 U/L (ref 0–37)
Albumin: 4.2 g/dL (ref 3.5–5.2)
Alkaline Phosphatase: 58 U/L (ref 39–117)
BUN: 23 mg/dL (ref 6–23)
CHLORIDE: 103 meq/L (ref 96–112)
CO2: 27 meq/L (ref 19–32)
Calcium: 9.4 mg/dL (ref 8.4–10.5)
Creatinine, Ser: 0.88 mg/dL (ref 0.40–1.20)
GFR: 80.99 mL/min (ref >60.00)
GLUCOSE: 103 mg/dL — AB (ref 70–99)
POTASSIUM: 3.8 meq/L (ref 3.5–5.1)
SODIUM: 138 meq/L (ref 135–145)
Total Bilirubin: 0.5 mg/dL (ref 0.2–1.2)
Total Protein: 7.1 g/dL (ref 6.0–8.3)

## 2015-06-03 LAB — CBC WITH DIFFERENTIAL/PLATELET
BASOS PCT: 0.4 % (ref 0.0–3.0)
Basophils Absolute: 0 10*3/uL (ref 0.0–0.1)
EOS PCT: 3.7 % (ref 0.0–5.0)
Eosinophils Absolute: 0.1 10*3/uL (ref 0.0–0.7)
HCT: 36.8 % (ref 36.0–46.0)
Hemoglobin: 12.3 g/dL (ref 12.0–15.0)
LYMPHS ABS: 1.5 10*3/uL (ref 0.7–4.0)
Lymphocytes Relative: 40.3 % (ref 12.0–46.0)
MCHC: 33.5 g/dL (ref 30.0–36.0)
MCV: 83.3 fl (ref 78.0–100.0)
MONO ABS: 0.3 10*3/uL (ref 0.1–1.0)
Monocytes Relative: 8.4 % (ref 3.0–12.0)
NEUTROS ABS: 1.8 10*3/uL (ref 1.4–7.7)
NEUTROS PCT: 47.2 % (ref 43.0–77.0)
PLATELETS: 160 10*3/uL (ref 150.0–400.0)
RBC: 4.42 Mil/uL (ref 3.87–5.11)
RDW: 15.9 % — AB (ref 11.5–15.5)
WBC: 3.8 10*3/uL — ABNORMAL LOW (ref 4.0–10.5)

## 2015-06-03 LAB — TSH: TSH: 2.43 u[IU]/mL (ref 0.35–4.50)

## 2015-06-03 NOTE — Telephone Encounter (Signed)
Colon cancer with colectomy in Oct 2016.  She calls today with complaints of ongoing diarrhea. She has leakage of stool when she urinates. She can have up to 7 bowel movements in a day, but usually has 3 to 4. No abdominal pain. Unsure of any fevers. She has tried Citracal and Metamucil without improvement. She sees blood on the toliet paper. She uses moistened wipes. Denies any rectal discomfort. Her PCP told her to contact us. Please advise.

## 2015-06-03 NOTE — Telephone Encounter (Signed)
Patient agrees to this plan. °

## 2015-06-03 NOTE — Telephone Encounter (Signed)
Needs labs (cmet, cbc, tsh, stool for c. Diff by PCR and by Toxin, stool for routine culture).  Since she had right hemicolectomy, this may be bile acid related.  Will start on appropriate meds pending the workup above.

## 2015-06-07 ENCOUNTER — Other Ambulatory Visit: Payer: Self-pay

## 2015-06-07 DIAGNOSIS — Z6834 Body mass index (BMI) 34.0-34.9, adult: Secondary | ICD-10-CM | POA: Diagnosis not present

## 2015-06-07 DIAGNOSIS — Z01419 Encounter for gynecological examination (general) (routine) without abnormal findings: Secondary | ICD-10-CM | POA: Diagnosis not present

## 2015-06-07 DIAGNOSIS — Z1231 Encounter for screening mammogram for malignant neoplasm of breast: Secondary | ICD-10-CM | POA: Diagnosis not present

## 2015-06-07 MED ORDER — CHOLESTYRAMINE 4 G PO PACK: 4.0000 g | PACK | Freq: Every day | ORAL | Status: DC

## 2015-06-13 ENCOUNTER — Other Ambulatory Visit: Payer: Medicare Other

## 2015-06-29 ENCOUNTER — Other Ambulatory Visit: Payer: Medicare Other

## 2015-06-29 ENCOUNTER — Other Ambulatory Visit: Payer: Self-pay | Admitting: Gastroenterology

## 2015-06-29 DIAGNOSIS — A09 Infectious gastroenteritis and colitis, unspecified: Secondary | ICD-10-CM | POA: Diagnosis not present

## 2015-06-29 DIAGNOSIS — R197 Diarrhea, unspecified: Secondary | ICD-10-CM

## 2015-06-30 LAB — C. DIFFICILE GDH AND TOXIN A/B
C. DIFF TOXIN A/B: NOT DETECTED
C. DIFFICILE GDH: NOT DETECTED

## 2015-06-30 LAB — CLOSTRIDIUM DIFFICILE BY PCR: CDIFFPCR: NOT DETECTED

## 2015-07-03 LAB — STOOL CULTURE

## 2015-07-05 ENCOUNTER — Other Ambulatory Visit: Payer: Self-pay | Admitting: Internal Medicine

## 2015-07-06 ENCOUNTER — Other Ambulatory Visit (INDEPENDENT_AMBULATORY_CARE_PROVIDER_SITE_OTHER): Payer: Medicare Other | Admitting: *Deleted

## 2015-07-06 DIAGNOSIS — I10 Essential (primary) hypertension: Secondary | ICD-10-CM | POA: Diagnosis not present

## 2015-07-06 DIAGNOSIS — E785 Hyperlipidemia, unspecified: Secondary | ICD-10-CM | POA: Diagnosis not present

## 2015-07-06 DIAGNOSIS — E11311 Type 2 diabetes mellitus with unspecified diabetic retinopathy with macular edema: Secondary | ICD-10-CM

## 2015-07-06 DIAGNOSIS — Z794 Long term (current) use of insulin: Secondary | ICD-10-CM | POA: Diagnosis not present

## 2015-07-06 LAB — LIPID PANEL
Cholesterol: 169 mg/dL (ref 125–200)
HDL: 74 mg/dL (ref >=46)
LDL Cholesterol: 73 mg/dL (ref <130)
Total CHOL/HDL Ratio: 2.3 Ratio (ref <=5.0)
Triglycerides: 109 mg/dL (ref <150)
VLDL: 22 mg/dL (ref <30)

## 2015-07-06 LAB — COMPREHENSIVE METABOLIC PANEL
ALT: 13 U/L (ref 6–29)
AST: 14 U/L (ref 10–35)
Albumin: 4 g/dL (ref 3.6–5.1)
Alkaline Phosphatase: 55 U/L (ref 33–130)
BUN: 16 mg/dL (ref 7–25)
CO2: 23 mmol/L (ref 20–31)
Calcium: 8.7 mg/dL (ref 8.6–10.4)
Chloride: 104 mmol/L (ref 98–110)
Creat: 0.84 mg/dL (ref 0.60–0.93)
Glucose, Bld: 121 mg/dL — ABNORMAL HIGH (ref 65–99)
Potassium: 3.9 mmol/L (ref 3.5–5.3)
Sodium: 140 mmol/L (ref 135–146)
Total Bilirubin: 0.4 mg/dL (ref 0.2–1.2)
Total Protein: 6.7 g/dL (ref 6.1–8.1)

## 2015-07-06 NOTE — Addendum Note (Signed)
Addended by: Eulis Foster on: 07/06/2015 09:15 AM   Modules accepted: Orders

## 2015-07-07 ENCOUNTER — Ambulatory Visit (INDEPENDENT_AMBULATORY_CARE_PROVIDER_SITE_OTHER): Payer: Medicare Other | Admitting: Cardiology

## 2015-07-07 ENCOUNTER — Encounter: Payer: Self-pay | Admitting: Cardiology

## 2015-07-07 VITALS — BP 124/68 | HR 73 | Ht 62.0 in | Wt 193.0 lb

## 2015-07-07 DIAGNOSIS — I10 Essential (primary) hypertension: Secondary | ICD-10-CM | POA: Diagnosis not present

## 2015-07-07 DIAGNOSIS — I2510 Atherosclerotic heart disease of native coronary artery without angina pectoris: Secondary | ICD-10-CM

## 2015-07-07 DIAGNOSIS — I119 Hypertensive heart disease without heart failure: Secondary | ICD-10-CM | POA: Insufficient documentation

## 2015-07-07 DIAGNOSIS — R0602 Shortness of breath: Secondary | ICD-10-CM | POA: Insufficient documentation

## 2015-07-07 DIAGNOSIS — E785 Hyperlipidemia, unspecified: Secondary | ICD-10-CM

## 2015-07-07 DIAGNOSIS — I5032 Chronic diastolic (congestive) heart failure: Secondary | ICD-10-CM

## 2015-07-07 DIAGNOSIS — R42 Dizziness and giddiness: Secondary | ICD-10-CM

## 2015-07-07 DIAGNOSIS — I2583 Coronary atherosclerosis due to lipid rich plaque: Secondary | ICD-10-CM

## 2015-07-07 DIAGNOSIS — E1159 Type 2 diabetes mellitus with other circulatory complications: Secondary | ICD-10-CM | POA: Insufficient documentation

## 2015-07-07 NOTE — Patient Instructions (Signed)
Medication Instructions:   STOP TAKING HYDRALAZINE NOW    Follow-Up:  WITH AN EXTENDER IN OUR OFFICE FOR 2 WEEKS OUT.        If you need a refill on your cardiac medications before your next appointment, please call your pharmacy.

## 2015-07-07 NOTE — Progress Notes (Signed)
Patient ID: Samantha [name], female   DOB: 01/02/1941, 75 y.o.   MRN: [id number]      Cardiology Office Note  Date:  07/07/2015   ID:  Samantha, [name] Aug 11, 1940, MRN [id number]  PCP:  Kathlene November, MD  Cardiologist:  Dr. Ena Dawley    Chief complaint:   History of Present Illness: Samantha [name] is a 75 y.o. female with a hx of diastolic HF, DM2, HTN, HL, fibromyalgia, palpitations secondary to symptomatic PVCs, LE edema. Nuclear study in 2014 was normal. Echo in 2014 with normal LV function to moderate diastolic dysfunction. Holter monitoring 2014 demonstrated sinus rhythm with PVCs. Last seen by [name] 3/16.  Admitted 10/20/14 with chest pain, nausea and diaphoresis. Symptoms resolved with nitroglycerin and aspirin in the emergency room. Serial troponin levels were normal. Echocardiogram demonstrated LVH, diastolic dysfunction and normal ejection fraction. There were no wall motion abnormalities. Patient has a recent diagnosis of colon CA and is set to undergo robotic-assisted partial colectomy 11/11/14. Symptoms were felt to likely be from musculoskeletal etiology as they occurred after lifting her husband who has MS. However given the need for surgical clearance, she was set up for outpatient nuclear study. Myoview 10/26/14 demonstrated EF 70% with small inferolateral and apical defect consistent with ischemia. Study was felt to be low risk. She saw Richardson Dopp in October 2016 and coronary CT was ordered. It showed 50-69% proximal to middle LAD stenosis and she was referred for colectomy that she underwent on 11/14/2014 without any complications. She is currently fully recovered and feels great. She denies any chest pain, shortness of breath, lower extremity edema, orthopnea, paroxysmal nocturnal dyspnea, palpitations or syncope. She is compliant with her medicines. She received a great news after his surgery did she's currently cancer free. She is very positive and active.     07/07/2015 - the patient is coming after 6 months, she is recovering from colon cancer and awaiting results from her follow-up scans and blood work. She has been doing house remodeling with removal of carpet and a lot of dust in her house, and developed 3 episodes of shortness of breath within the last couple weeks. She has also noticed feeling weak and dizzy in the last few days and just lack of energy and she was attributing all that allergies and working too hard. She denies any palpitation or syncope. She is compliant with her meds and tolerating them all very well including atorvastatin.  Studies/Reports Reviewed Today:   - Myoview 10/26/14: EF 70%, small area of inf-lat and apical ischemia, Low Risk  - Echo 10/20/14:  Focal basal and mod LVH, EF 60-65%, no RWMA, Gr 1 DD  - Nuclear study 11/08: Normal.   - Echocardiogram 3/14: Mild LVH, EF [id number], grade 2 diastolic dysfunction.   - Carotid US 5/14: < 39% bilateral ICA stenosis.   - Holter monitor x 24 hours 07/2012: NSR with PVCs.   Carlton Adam Myoview 09/24/12 was normal.   Past Medical History  Diagnosis Date  . Hyperlipidemia   . Hypertension   . Chest pain, atypical 11/2006    saw cards: neg/normal Holter, ECHo and myoview, admitted w/ CP 4/09 neg enz.and CT chest had a 9 beat NSVT  . Fibromyalgia   . Abnormal CT of the chest 2008    last CT4-l 2009:  . No f/u suggested   . 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 Shingles 11/2009  . Vaginal dysplasia   .  Heart murmur   . Asthma   . H/O hiatal hernia   . GERD (gastroesophageal reflux disease)   . Ocular migraine   . TIA (transient ischemic attack) 06/27/2012    "this is my first" (06/27/2012) and one 2 months later  . Rheumatoid factor positive   . Osteoarthritis   . Reactive airway disease 01/29/2002    dx of pseudoasthma / vcd in 2005 and nl sprirometry History of dyspnea, 2011,  improved after several medications were changed around Question of COPD, disproved  July 06, 2009 with nl pft's      . TIA (transient ischemic attack)     x2  . OSA (obstructive sleep apnea) 09/2007    dx w/ a sleep study, not on  CPAP  . Pneumonia     "double" in 2004  . Complication of anesthesia     trouble waking up  . PONV (postoperative nausea and vomiting)   . Torn rotator cuff     right worse than left, both are torn  . CHF (congestive heart failure) ([facility])     ischemia   . Type II diabetes mellitus ([place])     Takes metformin(11/05/14)  . 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 Malignant neoplasm of ascending colon ([place]) 2016    Minimally invasive right hemicolectomy to be done   . Collagen vascular disease ([place])     "arterial sclerosis" per pt  . CAD (coronary artery disease)     a. Coronary CTA 10/16: Coronary Ca score 211, mod non-obstructive CAD with LM mild plaque (25-50%), mid LAD 50-70%.  . Palpitations 11/23/2008    Qualifier: Diagnosis of  By: Dawson Bills     . Colon cancer [facility])     Past Surgical History  Procedure Laterality Date  . Thyroidectomy, partial  1960's  . Bunionectomy Left ~ 1977  . Breast biopsy Right 1999  . Anterior cervical decomp/discectomy fusion  2001    C 3, C4 and C5 plate and screws  . Cataract extraction w/ intraocular lens  implant, bilateral  2012  . Vaginal mass excision  1994    "Laser surgery for vaginal cancer; followed by chemotherapy" (06/27/2012)  . Abdominal hysterectomy  1980    NO oophorectomy per pt   . Eye surgery Bilateral     torq lens for cataracts  . Colon surgery  10/2014     Current Outpatient Prescriptions  Medication Sig Dispense Refill  . acetaminophen (TYLENOL) 500 MG tablet Take 1,000 mg by mouth every 6 (six) hours as needed for moderate pain.    [name] albuterol (PROVENTIL HFA;VENTOLIN HFA) 108 (90 Base) MCG/ACT inhaler Inhale 2 puffs into the lungs every 6 (six) hours as needed for wheezing or shortness of breath. 18 g 3  . atorvastatin (LIPITOR) 20 MG tablet Take 1 tablet (20 mg total) by mouth  daily. 90 tablet 3  . butalbital-acetaminophen-caffeine (FIORICET) 50-325-40 MG tablet Take 1 tablet by mouth every 6 (six) hours as needed for headache or migraine. 14 tablet 5  . CALCIUM PO Take 1 tablet by mouth every morning.     . cetirizine (ZYRTEC) 10 MG tablet Take 1 tablet (10 mg total) by mouth daily. 90 tablet 1  . cholecalciferol (VITAMIN D) 1000 UNITS tablet Take 1,000 Units by mouth every morning.     . cholestyramine (QUESTRAN) 4 g packet Take 1 packet (4 g total) by mouth daily. 30 each 6  . clopidogrel (PLAVIX) 75 MG tablet Take 1 tablet (75  mg total) by mouth daily. 90 tablet 1  . DULoxetine (CYMBALTA) 60 MG capsule TAKE 1 CAPSULE (60 MG TOTAL) BY MOUTH DAILY. 30 capsule 11  . furosemide (LASIX) 20 MG tablet Take 1 tablet (20 mg total) by mouth 2 (two) times daily. 60 tablet 6  . hydrALAZINE (APRESOLINE) 25 MG tablet Take 1 tablet (25 mg total) by mouth 3 (three) times daily. 270 tablet 3  . HYDROcodone-acetaminophen (NORCO) 5-325 MG tablet Take 1 tablet by mouth every 6 (six) hours as needed for moderate pain. 40 tablet 0  . hydrocortisone 2.5 % cream Apply topically 2 (two) times daily. 30 g 0  . lisinopril (PRINIVIL,ZESTRIL) 40 MG tablet Take 1 tablet (40 mg total) by mouth daily. 90 tablet 0  . metformin (FORTAMET) 500 MG (OSM) 24 hr tablet Take 1 tablet (500 mg total) by mouth daily with breakfast. 90 tablet 1  . Multiple Vitamin (MULTIVITAMIN) capsule Take 1 capsule by mouth daily.      . nebivolol (BYSTOLIC) 10 MG tablet Take 10 mg by mouth daily. Take 20mg  by mouth every morning and take 10 mg by mouth every evening    . ondansetron (ZOFRAN ODT) 4 MG disintegrating tablet Take 1 tablet (4 mg total) by mouth every 8 (eight) hours as needed for nausea or vomiting. 12 tablet 1  . Polyethyl Glycol-Propyl Glycol (SYSTANE) 0.4-0.3 % GEL Place 1 drop into both eyes daily as needed (dry eyes).     . Potassium 99 MG TABS Take 99 mg by mouth daily.     . pregabalin (LYRICA) 100 MG  capsule Take 1 capsule (100 mg total) by mouth at bedtime as needed. 30 capsule 6  . promethazine (PHENERGAN) 12.5 MG tablet Take 1 tablet (12.5 mg total) by mouth every 6 (six) hours as needed for nausea. 10 tablet 1  . ranitidine (ZANTAC) 300 MG tablet Take 1 tablet (300 mg total) by mouth at bedtime. 90 tablet 0  . meloxicam (MOBIC) 15 MG tablet Take 1 tablet (15 mg total) by mouth daily. (Patient not taking: Reported on 07/07/2015) 30 tablet 0   No current facility-administered medications for this visit.    Allergies:   Cefuroxime axetil; Oxycodone; Seldane; Zocor; Tramadol; Bactrim; and Pravastatin    Social History:  The patient  reports that she quit smoking about 17 years ago. Her smoking use included Cigarettes. She has a 1.25 pack-year smoking history. She has never used smokeless tobacco. She reports that she does not drink alcohol or use illicit drugs.   Family History:  The patient's family history includes Allergies in her sister; Aneurysm in her brother; Asthma in her paternal grandmother and sister; Diabetes in her brother and brother; Emphysema in her brother; Heart disease in her brother, father, and mother; Kidney failure in her brother; Lung cancer in her mother; Parkinsonism in her sister; Stroke in her brother, maternal grandmother, and paternal grandmother. There is no history of Breast cancer, Colon cancer, or Heart attack.    ROS:   Please see the history of present illness.   Review of Systems  Constitution: Positive for malaise/fatigue.  HENT: Positive for headaches.   Cardiovascular: Positive for chest pain and irregular heartbeat.  Respiratory: Positive for cough, shortness of breath and wheezing.   Hematologic/Lymphatic: Bruises/bleeds easily.  Musculoskeletal: Positive for joint pain and myalgias.  Gastrointestinal: Positive for abdominal pain, diarrhea, hematochezia and nausea.  Neurological: Positive for dizziness and loss of balance.  All other systems  reviewed and are negative.  PHYSICAL EXAM: VS:  Pulse 73  Ht 5\' 2"  (1.575 m)  Wt 193 lb (87.544 kg)  BMI 35.29 kg/m2    Wt Readings from Last 3 Encounters:  07/07/15 193 lb (87.544 kg)  04/18/15 192 lb (87.091 kg)  04/11/15 192 lb (87.091 kg)    GEN: Well nourished, well developed, in no acute distress HEENT: normal Neck: no JVD,  no masses Cardiac:  Normal S1/S2, RRR; no murmur ,  no rubs or gallops, no edema   Respiratory:  clear to auscultation bilaterally, no wheezing, rhonchi or rales. GI: soft, nontender, nondistended, + BS MS: no deformity or atrophy Skin: warm and dry  Neuro:  CNs II-XII intact, Strength and sensation are intact Psych: Normal affect  EKG:  EKG is ordered today.  It demonstrates:   NSR, HR 61, normal axis, first degree AV block, QTC 432, no change from prior tracing  Recent Labs: 10/19/2014: B Natriuretic Peptide 54.1 06/03/2015: Hemoglobin 12.3; Platelets 160.0; TSH 2.43 07/06/2015: ALT 13; BUN 16; Creat 0.84; Potassium 3.9; Sodium 140   Lipid Panel    Component Value Date/Time   CHOL 169 07/06/2015 0916   TRIG 109 07/06/2015 0916   HDL 74 07/06/2015 0916   CHOLHDL 2.3 07/06/2015 0916   VLDL 22 07/06/2015 0916   LDLCALC 73 07/06/2015 0916   LDLDIRECT 126.2 12/07/2010 1039    Coronary CTA: Coronary Arteries: Normal coronary origin. Right dominance. RCA is a large dominant vessel that gives rise to PDA. There is only minimal plaque. PLA is very small and poorly visualized. Left main gives rise to LAD, a very small ramus intermedius. Left main has mild excentric calcified plaque with associated stenosis 25-50%. LAD is a large tortuous vessel that has mild mixed predominantly calcified plaque in its proximal segment and associated stenosis 25-50%. Mid LAD has moderate mixed plaque with a focal stenosis of 50-70%. Distal LAD has minimal plaque. The first diagonal branch has a calcified plaque in its mid segment. The vessel size and the study  quality doesn't allow accurate stenosis assessment. LCX is a small non-dominant vessel that ha only minimal plaque. Ramus intermedius is a very small vessel with no obvious plaque. IMPRESSION: 1. Coronary calcium score of 211. This was 73 percentile for age and sex matched control. 2. Normal coronary origin. Right dominance. 3. Mild mixed plaque in the proximal LAD and moderate plaque in the mid LAD with maximum stenosis 50-69%. Nonobstructive CAD. An aggressive risk factor modification is recommended. Ena Dawley  EKG performed today 07/07/2015 shows normal sinus rhythm with first-degree AV block like axis deviation and no change from prior ECG.  ASSESSMENT AND PLAN:  1. New shortness of breath and dizziness, she is on high dose of antihypertensive medicines, will hold hydralazine as it is unknown vasodilator and follow-up in 2 weeks to see if she has any improvement of symptoms the risk is that her blood pressure will rise. She is only using Lasix when necessary and I completely agree with that as she appears euvolemic. If her symptoms persist in 2 weeks I would consider redoing her stress test as she has known moderate 50-69% stenosis a mid LAD.  2. Moderate nonobstructive CAD and proximal LAD - we will continue aggressive medical therapy with atorvastatin, lisinopril and nebivolol. She is also taking plavix.  3. Chronic Diastolic CHF:  Volume appears stable.  Continue Lasix when necessary..   4. HTN:  Controlled, however now has episodes of dizziness we will try to hold hydralazine, however if  her BP rises we will have to restart in the future.   5. Hyperlipidemia: Increase atorvastatin at the last visit, her lipids are now on call.  6. Diabetes:  controlled. A1c 2/17 was 6.5.  FU with PCP.   7. Colon CA:  She is currently cancer free and awaiting follow-up results.  Follow-up in 2 weeks.  Ena Dawley 07/07/2015 9:22 AM    [facility] [address], [place], [place]  [address] Phone: [phone]; Fax: [phone]

## 2015-07-13 ENCOUNTER — Telehealth: Payer: Self-pay | Admitting: Internal Medicine

## 2015-07-13 NOTE — Telephone Encounter (Signed)
Ok with me 

## 2015-07-13 NOTE — Telephone Encounter (Signed)
Pt called request to transfer from Dr. Larose Kells to Dr. Quay Burow due to distance. Please advise.

## 2015-07-14 NOTE — Telephone Encounter (Signed)
ok 

## 2015-07-14 NOTE — Telephone Encounter (Signed)
Left vm inform pt.

## 2015-07-19 ENCOUNTER — Ambulatory Visit: Payer: Medicare Other | Admitting: Internal Medicine

## 2015-07-22 ENCOUNTER — Encounter: Payer: Self-pay | Admitting: Physician Assistant

## 2015-07-22 NOTE — Progress Notes (Signed)
Cardiology Office Note    Date:  07/25/2015  ID:  Samantha [name], Samantha [name] [birth year], MRN [id number] PCP:  [name], MD  Cardiologist:  Meda Coffee   Chief Complaint: f/u dizziness and SOB  History of Present Illness:  Samantha [name] is a 75 y.o. female with history of CAD (moderate nonobstructive disease by cardiac CT 10/2014), TIA (on Plavix), chronic diastolic CHF, DM2, HTN, HL, fibromyalgia, palpitations secondary to symptomatic PVCs, LE edema, colon cancer (s/p lap partial colectomy 10/2014), reactive airway disease who presents for follow-up of SOB and dizziness. Per review of chart, she has a history of CP in 2016 felt MSK but nuclear stress test was abnormal. She underwent cardiac CT 10/2014 showing 50-69% in the prox and mid LAD. She went on to have unremarkable colon surgery. Last echo 09/2014: focal basal and moderate concentric hypertrophy, EF 60-65%, grade 1 DD. She saw [name] on 07/07/15 with complaints of dizziness and SOB. This began after remodeling her home with removal of carpet and a lot of dust. EKG was unchanged from prior. BP was 124/68. [name] recommended holding hydralazine and reassessing for need for further testing. CMET 6/7 unremarkable, LDL 73. Last CBC in 05/2015 with normal Hgb.  She returns to clinic today and continues to have significant DOE and fatigue. She has reduced her amount of activity and these symptoms have improved but only by virtue that she is doing less. She also reports intermittent dull chest pain that can occur at random both at rest and with exertion. Sometimes it's worse with exerting herself but not always. She is not reporting any discomfort today. It is not worse with inspiration or palpation. Her dizziness has improved off hydralazine. She does report intermittent rectal bleeding for which she has follow-up next month with GI. She had an episode of dark stools one day within the last month. She is not on any iron and had not taken any  Pepto-Bismol that day.  Past Medical History  Diagnosis Date  . Hyperlipidemia   . Hypertension   . Fibromyalgia   . Abnormal CT of the chest 2008    last CT4-l 2009:  . No f/u suggested   . 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 Shingles 11/2009  . Vaginal dysplasia   . Asthma   . H/O hiatal hernia   . GERD (gastroesophageal reflux disease)   . Ocular migraine   . Rheumatoid factor positive   . Osteoarthritis   . Reactive airway disease 01/29/2002    dx of pseudoasthma / vcd in 2005 and nl sprirometry History of dyspnea, 2011,  improved after several medications were changed around Question of COPD, disproved July 06, 2009 with nl pft's      . TIA (transient ischemic attack)     x2 - on Plavix for this  . OSA (obstructive sleep apnea) 09/2007    dx w/ a sleep study, not on  CPAP  . Pneumonia     "double" in 2004  . Complication of anesthesia     trouble waking up  . PONV (postoperative nausea and vomiting)   . Torn rotator cuff     right worse than left, both are torn  . Type II diabetes mellitus ([place])   . 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 Malignant neoplasm of ascending colon ([place]) 2016    Minimally invasive right hemicolectomy to be done   . Collagen vascular disease ([place])     "  arterial sclerosis" per pt  . CAD (coronary artery disease)     a. Coronary CTA 10/16: Coronary Ca score 211, mod non-obstructive CAD with LM mild plaque (25-50%), mid LAD 50-69%.  . Chronic diastolic CHF (congestive heart failure) [facility])     Past Surgical History  Procedure Laterality Date  . Thyroidectomy, partial  1960's  . Bunionectomy Left ~ 1977  . Breast biopsy Right 1999  . Anterior cervical decomp/discectomy fusion  2001    C 3, C4 and C5 plate and screws  . Cataract extraction w/ intraocular lens  implant, bilateral  2012  . Vaginal mass excision  1994    "Laser surgery for vaginal cancer; followed by chemotherapy" (06/27/2012)  . Abdominal hysterectomy  1980    NO  oophorectomy per pt   . Eye surgery Bilateral     torq lens for cataracts  . Colon surgery  10/2014    Current Medications: Current Outpatient Prescriptions  Medication Sig Dispense Refill  . acetaminophen (TYLENOL) 500 MG tablet Take 1,000 mg by mouth every 6 (six) hours as needed for moderate pain.    [name] albuterol (PROVENTIL HFA;VENTOLIN HFA) 108 (90 Base) MCG/ACT inhaler Inhale 2 puffs into the lungs every 6 (six) hours as needed for wheezing or shortness of breath. 18 g 3  . atorvastatin (LIPITOR) 20 MG tablet Take 1 tablet (20 mg total) by mouth daily. 90 tablet 3  . butalbital-acetaminophen-caffeine (FIORICET) 50-325-40 MG tablet Take 1 tablet by mouth every 6 (six) hours as needed for headache or migraine. 14 tablet 5  . CALCIUM PO Take 1 tablet by mouth every morning.     . cetirizine (ZYRTEC) 10 MG tablet Take 1 tablet (10 mg total) by mouth daily. 90 tablet 1  . cholecalciferol (VITAMIN D) 1000 UNITS tablet Take 1,000 Units by mouth every morning.     . clopidogrel (PLAVIX) 75 MG tablet Take 1 tablet (75 mg total) by mouth daily. 90 tablet 1  . DULoxetine (CYMBALTA) 60 MG capsule TAKE 1 CAPSULE (60 MG TOTAL) BY MOUTH DAILY. 30 capsule 11  . furosemide (LASIX) 20 MG tablet Take 1 tablet (20 mg total) by mouth 2 (two) times daily. 60 tablet 6  . HYDROcodone-acetaminophen (NORCO) 5-325 MG tablet Take 1 tablet by mouth every 6 (six) hours as needed for moderate pain. 40 tablet 0  . hydrocortisone 2.5 % cream Apply topically 2 (two) times daily. 30 g 0  . lisinopril (PRINIVIL,ZESTRIL) 40 MG tablet Take 1 tablet (40 mg total) by mouth daily. 90 tablet 0  . metformin (FORTAMET) 500 MG (OSM) 24 hr tablet Take 1 tablet (500 mg total) by mouth daily with breakfast. 90 tablet 1  . Multiple Vitamin (MULTIVITAMIN) capsule Take 1 capsule by mouth daily.      . nebivolol (BYSTOLIC) 10 MG tablet Take 10 mg by mouth daily. Take 20mg  by mouth every morning and take 10 mg by mouth every evening    .  ondansetron (ZOFRAN ODT) 4 MG disintegrating tablet Take 1 tablet (4 mg total) by mouth every 8 (eight) hours as needed for nausea or vomiting. 12 tablet 1  . Polyethyl Glycol-Propyl Glycol (SYSTANE) 0.4-0.3 % GEL Place 1 drop into both eyes daily as needed (dry eyes).     . Potassium 99 MG TABS Take 99 mg by mouth daily.     . pregabalin (LYRICA) 100 MG capsule Take 100 mg by mouth as needed (FIBROMYALGIA).    [name] promethazine (PHENERGAN) 12.5 MG tablet Take 1  tablet (12.5 mg total) by mouth every 6 (six) hours as needed for nausea. 10 tablet 1  . ranitidine (ZANTAC) 300 MG tablet Take 1 tablet (300 mg total) by mouth at bedtime. 90 tablet 0   No current facility-administered medications for this visit.     Allergies:   Bactrim; Cefuroxime axetil; Oxycodone; Pravastatin; Seldane; Zocor; and Tramadol   Social History   Social History  . Marital Status: Married    Spouse Name: Ilona Sorrel  . Number of Children: 2  . Years of Education: masters   Occupational History  . Retired, disable since 2000    Social History Main Topics  . Smoking status: Former Smoker -- 0.25 packs/day for 5 years    Types: Cigarettes    Quit date: 01/29/1998  . Smokeless tobacco: Never Used     Comment: Quit in 2001  . Alcohol Use: No  . Drug Use: No  . Sexual Activity: No   Other Topics Concern  . None   Social History Narrative   On disability since 2000--- also husband has MS   Education. College.   Right handed.     Family History:  The patient's family history includes Allergies in her sister; Aneurysm in her brother; Asthma in her paternal grandmother and sister; Diabetes in her brother and brother; Emphysema in her brother; Heart disease in her brother, father, and mother; Kidney failure in her brother; Lung cancer in her mother; Parkinsonism in her sister; Stroke in her brother, maternal grandmother, and paternal grandmother. There is no history of Breast cancer, Colon cancer, or Heart attack.    ROS:   Please see the history of present illness. No syncope. All other systems are reviewed and otherwise negative.    PHYSICAL EXAM:   VS:  BP 130/82 mmHg  Pulse 59  Ht 5\' 2"  (1.575 m)  Wt 193 lb 6.4 oz (87.726 kg)  BMI 35.36 kg/m2  BMI: Body mass index is 35.36 kg/(m^2). GEN: Well nourished, well developed AAF, in no acute distress HEENT: normocephalic, atraumatic Neck: no JVD, carotid bruits, or masses Cardiac: RRR; no murmurs, rubs, or gallops, no edema  Respiratory:  clear to auscultation bilaterally, normal work of breathing GI: soft, nontender, nondistended, + BS MS: no deformity or atrophy Skin: warm and dry, no rash Neuro:  Alert and Oriented x 3, Strength and sensation are intact, follows commands Psych: euthymic mood, full affect  Wt Readings from Last 3 Encounters:  07/25/15 193 lb 6.4 oz (87.726 kg)  07/07/15 193 lb (87.544 kg)  04/18/15 192 lb (87.091 kg)      Studies/Labs Reviewed:   EKG:  EKG was ordered today and personally reviewed by me and demonstrates sinus bradycardia 59bpm, 1st degree AV block, TWI V2  Recent Labs: 10/19/2014: B Natriuretic Peptide 54.1 06/03/2015: Hemoglobin 12.3; Platelets 160.0; TSH 2.43 07/06/2015: ALT 13; BUN 16; Creat 0.84; Potassium 3.9; Sodium 140   Lipid Panel    Component Value Date/Time   CHOL 169 07/06/2015 0916   TRIG 109 07/06/2015 0916   HDL 74 07/06/2015 0916   CHOLHDL 2.3 07/06/2015 0916   VLDL 22 07/06/2015 0916   LDLCALC 73 07/06/2015 0916   LDLDIRECT 126.2 12/07/2010 1039    Additional studies/ records that were reviewed today include: Summarized above.    ASSESSMENT & PLAN:   1. SOB/chest pain/fatigue - symptoms persist. EKG unremarkable. We discussed options for further testing including repeat nuclear stress testing (as suggested by Dr. Francesca Oman recent note) versus cardiac catheterization. Risks  and benefits of cardiac catheterization have been discussed with the patient. These include bleeding,  infection, kidney damage, stroke, heart attack, death. The risk of false positive/negative of nuclear stress test was discussed. At this time, she prefers to proceed with nuclear stress test. I also feel that she needs a CBC to exclude anemia given her complaints of rectal bleeding and fatigue. We discussed the need for her to f/u with PCP/GI for this. As her EKG is normal, will also check d-dimer given history of malignancy. If it is abnormal, she will need CTA to exclude pulmonary embolism. She is hemodynamically stable in clinic today with pulse of 97%. 2. Dizziness - improved. Monitor. 3. CAD - proceeding with nuclear stress test as above. Continue BB, statin. She is on Plavix for history of TIA. 4. Chronic diastolic CHF - appears euvolemic but will add BNP to labs in case there is a component of HF contributing to her dyspnea. Only takes Lasix PRN. 5. Essential HTN - controlled.  Disposition: F/u with [name] or myself if possible in 1 month.   Medication Adjustments/Labs and Tests Ordered: Current medicines are reviewed at length with the patient today.  Concerns regarding medicines are outlined above. Medication changes, Labs and Tests ordered today are summarized above and listed in the Patient Instructions accessible in Encounters.   Raechel Ache PA-C  07/25/2015 9:33 AM    [address], [place], [place]  [address] Phone: [phone]; Fax: [phone]

## 2015-07-25 ENCOUNTER — Encounter: Payer: Self-pay | Admitting: Physician Assistant

## 2015-07-25 ENCOUNTER — Ambulatory Visit (INDEPENDENT_AMBULATORY_CARE_PROVIDER_SITE_OTHER): Payer: Medicare Other | Admitting: Physician Assistant

## 2015-07-25 ENCOUNTER — Telehealth: Payer: Self-pay | Admitting: *Deleted

## 2015-07-25 VITALS — BP 130/82 | HR 59 | Ht 62.0 in | Wt 193.4 lb

## 2015-07-25 DIAGNOSIS — R0602 Shortness of breath: Secondary | ICD-10-CM

## 2015-07-25 DIAGNOSIS — R079 Chest pain, unspecified: Secondary | ICD-10-CM

## 2015-07-25 DIAGNOSIS — I2510 Atherosclerotic heart disease of native coronary artery without angina pectoris: Secondary | ICD-10-CM | POA: Diagnosis not present

## 2015-07-25 DIAGNOSIS — R5383 Other fatigue: Secondary | ICD-10-CM

## 2015-07-25 DIAGNOSIS — R42 Dizziness and giddiness: Secondary | ICD-10-CM

## 2015-07-25 DIAGNOSIS — I10 Essential (primary) hypertension: Secondary | ICD-10-CM

## 2015-07-25 DIAGNOSIS — I5032 Chronic diastolic (congestive) heart failure: Secondary | ICD-10-CM

## 2015-07-25 LAB — CBC
HEMATOCRIT: 37.4 % (ref 35.0–45.0)
HEMOGLOBIN: 12.9 g/dL (ref 11.7–15.5)
MCH: 27.9 pg (ref 27.0–33.0)
MCHC: 34.5 g/dL (ref 32.0–36.0)
MCV: 81 fL (ref 80.0–100.0)
MPV: 10.4 fL (ref 7.5–12.5)
Platelets: 142 10*3/uL (ref 140–400)
RBC: 4.62 MIL/uL (ref 3.80–5.10)
RDW: 14.9 % (ref 11.0–15.0)
WBC: 3.9 10*3/uL (ref 3.8–10.8)

## 2015-07-25 LAB — TSH: TSH: 2.22 m[IU]/L

## 2015-07-25 LAB — D-DIMER, QUANTITATIVE: D-Dimer, Quant: 0.66 ug/mL-FEU — ABNORMAL HIGH (ref 0.00–0.50)

## 2015-07-25 LAB — BRAIN NATRIURETIC PEPTIDE: Brain Natriuretic Peptide: 31.8 pg/mL (ref <100)

## 2015-07-25 NOTE — Addendum Note (Signed)
Addended by: Eulis Foster on: 07/25/2015 10:15 AM   Modules accepted: Orders

## 2015-07-25 NOTE — Telephone Encounter (Signed)
Pt aware of lab results.  She has been advised that she needs CTA Chest to r/o pe.  She will await schedulers to call her to schedule this test. Pt agreeable with this plan. Order in [place].

## 2015-07-25 NOTE — Addendum Note (Signed)
Addended by: Eulis Foster on: 07/25/2015 10:16 AM   Modules accepted: Orders

## 2015-07-25 NOTE — Telephone Encounter (Signed)
-----   Message from Charlie Pitter, [place] sent at 07/25/2015  4:21 PM EDT ----- Please call patient. Labs are normal except mildly elevated d-dimer. Needs CT angio chest to rule out PE given her symptoms. Blood count and BNP were normal. Dayna Dunn PA-C

## 2015-07-25 NOTE — Patient Instructions (Addendum)
Medication Instructions:  Your physician recommends that you continue on your current medications as directed. Please refer to the Current Medication list given to you today.   Labwork: TODAY:  CBC, D-DIMER, & TSH  Testing/Procedures: Your physician has requested that you have a lexiscan myoview. For further information please visit [email or web address]. Please follow instruction sheet, as given.  Follow-Up: Your physician recommends that you schedule a follow-up appointment in: 1 MONTH WITH [name], DAYNA DUNN, PA-C, OR 1ST AVAILABLE EXTENDER   Any Other Special Instructions Will Be Listed Below (If Applicable).  Pharmacologic Stress Electrocardiogram A pharmacologic stress electrocardiogram is a heart (cardiac) test that uses nuclear imaging to evaluate the blood supply to your heart. This test may also be called a pharmacologic stress electrocardiography. Pharmacologic means that a medicine is used to increase your heart rate and blood pressure.  This stress test is done to find areas of poor blood flow to the heart by determining the extent of coronary artery disease (CAD). Some people exercise on a treadmill, which naturally increases the blood flow to the heart. For those people unable to exercise on a treadmill, a medicine is used. This medicine stimulates your heart and will cause your heart to beat harder and more quickly, as if you were exercising.  Pharmacologic stress tests can help determine:  The adequacy of blood flow to your heart during increased levels of activity in order to clear you for discharge home.  The extent of coronary artery blockage caused by CAD.  Your prognosis if you have suffered a heart attack.  The effectiveness of cardiac procedures done, such as an angioplasty, which can increase the circulation in your coronary arteries.  Causes of chest pain or pressure. LET [facility] CARE PROVIDER KNOW ABOUT:  Any allergies you have.  All medicines you  are taking, including vitamins, herbs, eye drops, creams, and over-the-counter medicines.  Previous problems you or members of your family have had with the use of anesthetics.  Any blood disorders you have.  Previous surgeries you have had.  Medical conditions you have.  Possibility of pregnancy, if this applies.  If you are currently breastfeeding. RISKS AND COMPLICATIONS Generally, this is a safe procedure. However, as with any procedure, complications can occur. Possible complications include:  You develop pain or pressure in the following areas:  Chest.  Jaw or neck.  Between your shoulder blades.  Radiating down your left arm.  Headache.  Dizziness or light-headedness.  Shortness of breath.  Increased or irregular heartbeat.  Low blood pressure.  Nausea or vomiting.  Flushing.  Redness going up the arm and slight pain during injection of medicine.  Heart attack (rare). BEFORE THE PROCEDURE   Avoid all forms of caffeine for 24 hours before your test or as directed by your health care provider. This includes coffee, tea (even decaffeinated tea), caffeinated sodas, chocolate, cocoa, and certain pain medicines.  Follow your health care provider's instructions regarding eating and drinking before the test.  Take your medicines as directed at regular times with water unless instructed otherwise. Exceptions may include:  If you have diabetes, ask how you are to take your insulin or pills. It is common to adjust insulin dosing the morning of the test.  If you are taking beta-blocker medicines, it is important to talk to your health care provider about these medicines well before the date of your test. Taking beta-blocker medicines may interfere with the test. In some cases, these medicines need to be changed or  stopped 24 hours or more before the test.  If you wear a nitroglycerin patch, it may need to be removed prior to the test. Ask your health care provider if  the patch should be removed before the test.  If you use an inhaler for any breathing condition, bring it with you to the test.  If you are an outpatient, bring a snack so you can eat right after the stress phase of the test.  Do not smoke for 4 hours prior to the test or as directed by your health care provider.  Do not apply lotions, powders, creams, or oils on your chest prior to the test.  Wear comfortable shoes and clothing. Let your health care provider know if you were unable to complete or follow the preparations for your test. PROCEDURE   Multiple patches (electrodes) will be put on your chest. If needed, small areas of your chest may be shaved to get better contact with the electrodes. Once the electrodes are attached to your body, multiple wires will be attached to the electrodes, and your heart rate will be monitored.  An IV access will be started. A nuclear trace (isotope) is given. The isotope may be given intravenously, or it may be swallowed. Nuclear refers to several types of radioactive isotopes, and the nuclear isotope lights up the arteries so that the nuclear images are clear. The isotope is absorbed by your body. This results in low radiation exposure.  A resting nuclear image is taken to show how your heart functions at rest.  A medicine is given through the IV access.  A second scan is done about 1 hour after the medicine injection and determines how your heart functions under stress.  During this stress phase, you will be connected to an electrocardiogram machine. Your blood pressure and oxygen levels will be monitored. AFTER THE PROCEDURE   Your heart rate and blood pressure will be monitored after the test.  You may return to your normal schedule, including diet,activities, and medicines, unless your health care provider tells you otherwise.   This information is not intended to replace advice given to you by your health care provider. Make sure you discuss  any questions you have with your health care provider.   Document Released: 06/03/2008 Document Revised: 01/20/2013 Document Reviewed: 09/22/2012 Elsevier Interactive Patient Education [employer].   If you need a refill on your cardiac medications before your next appointment, please call your pharmacy.

## 2015-07-26 ENCOUNTER — Ambulatory Visit (INDEPENDENT_AMBULATORY_CARE_PROVIDER_SITE_OTHER)
Admission: RE | Admit: 2015-07-26 | Discharge: 2015-07-26 | Disposition: A | Discharge status: Home / Self Care | Payer: Medicare Other | Source: Ambulatory Visit | Attending: Physician Assistant | Admitting: Physician Assistant

## 2015-07-26 DIAGNOSIS — R0602 Shortness of breath: Secondary | ICD-10-CM

## 2015-07-26 DIAGNOSIS — R079 Chest pain, unspecified: Secondary | ICD-10-CM | POA: Diagnosis not present

## 2015-07-26 MED ORDER — IOPAMIDOL (ISOVUE-370) INJECTION 76%
80.0000 mL | Freq: Once | INTRAVENOUS | Status: AC | PRN
  Administered 2015-07-26: 80 mL via INTRAVENOUS

## 2015-07-27 ENCOUNTER — Telehealth: Payer: Self-pay | Admitting: Cardiology

## 2015-07-27 NOTE — Telephone Encounter (Signed)
Follow-up      Calling the nurses back regarding the lab work

## 2015-07-27 NOTE — Telephone Encounter (Signed)
Pt aware of her CTA.  She has been aware that we will await her nuc test. Pt verbalized understanding.

## 2015-07-28 ENCOUNTER — Telehealth (HOSPITAL_COMMUNITY): Payer: Self-pay | Admitting: *Deleted

## 2015-07-28 NOTE — Telephone Encounter (Signed)
Patient given detailed instructions per Myocardial Perfusion Study Information Sheet for the test on 08/03/15. Patient notified to arrive 15 minutes early and that it is imperative to arrive on time for appointment to keep from having the test rescheduled.  If you need to cancel or reschedule your appointment, please call the office within 24 hours of your appointment. Failure to do so may result in a cancellation of your appointment, and a $50 no show fee. Patient verbalized understanding. Hubbard Robinson, RN

## 2015-08-03 ENCOUNTER — Ambulatory Visit (HOSPITAL_COMMUNITY): Payer: Medicare Other | Attending: Cardiovascular Disease

## 2015-08-03 ENCOUNTER — Other Ambulatory Visit: Payer: Self-pay | Admitting: Cardiology

## 2015-08-03 DIAGNOSIS — R0609 Other forms of dyspnea: Secondary | ICD-10-CM | POA: Diagnosis not present

## 2015-08-03 DIAGNOSIS — I110 Hypertensive heart disease with heart failure: Secondary | ICD-10-CM | POA: Diagnosis not present

## 2015-08-03 DIAGNOSIS — R079 Chest pain, unspecified: Secondary | ICD-10-CM | POA: Diagnosis not present

## 2015-08-03 DIAGNOSIS — R42 Dizziness and giddiness: Secondary | ICD-10-CM

## 2015-08-03 DIAGNOSIS — I2510 Atherosclerotic heart disease of native coronary artery without angina pectoris: Secondary | ICD-10-CM | POA: Insufficient documentation

## 2015-08-03 DIAGNOSIS — R0602 Shortness of breath: Secondary | ICD-10-CM | POA: Diagnosis not present

## 2015-08-03 DIAGNOSIS — R5383 Other fatigue: Secondary | ICD-10-CM | POA: Diagnosis not present

## 2015-08-03 DIAGNOSIS — I5032 Chronic diastolic (congestive) heart failure: Secondary | ICD-10-CM

## 2015-08-03 DIAGNOSIS — E119 Type 2 diabetes mellitus without complications: Secondary | ICD-10-CM | POA: Diagnosis not present

## 2015-08-03 DIAGNOSIS — I10 Essential (primary) hypertension: Secondary | ICD-10-CM

## 2015-08-03 LAB — MYOCARDIAL PERFUSION IMAGING
CHL CUP NUCLEAR SRS: 0
CHL CUP NUCLEAR SSS: 0
CHL CUP RESTING HR STRESS: 59 {beats}/min
LV dias vol: 68 mL (ref 46–106)
LVSYSVOL: 22 mL
Peak HR: 88 {beats}/min
RATE: 0.32
SDS: 0
TID: 0.97

## 2015-08-03 MED ORDER — NITROGLYCERIN 0.4 MG SL SUBL: 0.4000 mg | SUBLINGUAL_TABLET | SUBLINGUAL | Status: DC | PRN

## 2015-08-03 MED ORDER — TECHNETIUM TC 99M TETROFOSMIN IV KIT
32.7000 | PACK | Freq: Once | INTRAVENOUS | Status: AC | PRN
  Administered 2015-08-03: 32.7 via INTRAVENOUS
  Filled 2015-08-03: qty 33

## 2015-08-03 MED ORDER — REGADENOSON 0.4 MG/5ML IV SOLN
0.4000 mg | Freq: Once | INTRAVENOUS | Status: AC
  Administered 2015-08-03: 0.4 mg via INTRAVENOUS

## 2015-08-03 MED ORDER — TECHNETIUM TC 99M TETROFOSMIN IV KIT
10.2000 | PACK | Freq: Once | INTRAVENOUS | Status: AC | PRN
  Administered 2015-08-03: 10 via INTRAVENOUS
  Filled 2015-08-03: qty 10

## 2015-08-09 ENCOUNTER — Ambulatory Visit (INDEPENDENT_AMBULATORY_CARE_PROVIDER_SITE_OTHER): Payer: Medicare Other | Admitting: Gastroenterology

## 2015-08-09 ENCOUNTER — Encounter: Payer: Self-pay | Admitting: Gastroenterology

## 2015-08-09 VITALS — BP 112/70 | HR 80 | Ht 62.0 in | Wt 194.0 lb

## 2015-08-09 DIAGNOSIS — I2510 Atherosclerotic heart disease of native coronary artery without angina pectoris: Secondary | ICD-10-CM

## 2015-08-09 DIAGNOSIS — K625 Hemorrhage of anus and rectum: Secondary | ICD-10-CM

## 2015-08-09 NOTE — Progress Notes (Signed)
Review of pertinent gastrointestinal problems: 1. Colon cancer; colonoscopy [name] 7 2016 done for minor rectal bleeding found a small descending colon firm polypoid lesion. This was resected with snare cautery and confirmed adenocarcinoma. She underwent robot-assisted right colectomy with [name] October 2016 and the pathologic specimen prove that there was no residual adenocarcinoma. This was a T1, N0 M0 right-sided colon cancer.   HPI: This is a      very pleasant 75 year old woman whom I last saw the time of colonoscopy about a year ago  Chief complaint is improved diarrhea, minor rectal bleeding  Terrible diarrhea a couple months ago.  She started cholestyramine powder, took it for 3 weeks and bowel much better.  She is bothered a bit by minor diarhrea at time of urination.  This is much better than prev   Past Medical History  Diagnosis Date  . Hyperlipidemia   . Hypertension   . Fibromyalgia   . Abnormal CT of the chest 2008    last CT4-l 2009:  . No f/u suggested   . 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 Shingles 11/2009  . Vaginal dysplasia   . Asthma   . H/O hiatal hernia   . GERD (gastroesophageal reflux disease)   . Ocular migraine   . Rheumatoid factor positive   . Osteoarthritis   . Reactive airway disease 01/29/2002    dx of pseudoasthma / vcd in 2005 and nl sprirometry History of dyspnea, 2011,  improved after several medications were changed around Question of COPD, disproved July 06, 2009 with nl pft's      . TIA (transient ischemic attack)     x2 - on Plavix for this  . OSA (obstructive sleep apnea) 09/2007    dx w/ a sleep study, not on  CPAP  . Pneumonia     "double" in 2004  . Complication of anesthesia     trouble waking up  . PONV (postoperative nausea and vomiting)   . Torn rotator cuff     right worse than left, both are torn  . Type II diabetes mellitus ([place])   . 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 Malignant neoplasm of  ascending colon ([place]) 2016    Minimally invasive right hemicolectomy to be done   . Collagen vascular disease ([place])     "arterial sclerosis" per pt  . CAD (coronary artery disease)     a. Coronary CTA 10/16: Coronary Ca score 211, mod non-obstructive CAD with LM mild plaque (25-50%), mid LAD 50-69%.  . Chronic diastolic CHF (congestive heart failure) [facility])     Past Surgical History  Procedure Laterality Date  . Thyroidectomy, partial  1960's  . Bunionectomy Left ~ 1977  . Breast biopsy Right 1999  . Anterior cervical decomp/discectomy fusion  2001    C 3, C4 and C5 plate and screws  . Cataract extraction w/ intraocular lens  implant, bilateral  2012  . Vaginal mass excision  1994    "Laser surgery for vaginal cancer; followed by chemotherapy" (06/27/2012)  . Abdominal hysterectomy  1980    NO oophorectomy per pt   . Eye surgery Bilateral     torq lens for cataracts  . Colon surgery  10/2014    Current Outpatient Prescriptions  Medication Sig Dispense Refill  . acetaminophen (TYLENOL) 500 MG tablet Take 1,000 mg by mouth every 6 (six) hours as needed for moderate pain.    [name] albuterol (PROVENTIL  HFA;VENTOLIN HFA) 108 (90 Base) MCG/ACT inhaler Inhale 2 puffs into the lungs every 6 (six) hours as needed for wheezing or shortness of breath. 18 g 3  . atorvastatin (LIPITOR) 20 MG tablet Take 1 tablet (20 mg total) by mouth daily. 90 tablet 3  . butalbital-acetaminophen-caffeine (FIORICET) 50-325-40 MG tablet Take 1 tablet by mouth every 6 (six) hours as needed for headache or migraine. 14 tablet 5  . CALCIUM PO Take 1 tablet by mouth every morning.     . cetirizine (ZYRTEC) 10 MG tablet Take 1 tablet (10 mg total) by mouth daily. 90 tablet 1  . cholecalciferol (VITAMIN D) 1000 UNITS tablet Take 1,000 Units by mouth every morning.     . clopidogrel (PLAVIX) 75 MG tablet Take 1 tablet (75 mg total) by mouth daily. 90 tablet 1  . furosemide (LASIX) 20 MG tablet Take 1 tablet (20 mg total)  by mouth 2 (two) times daily. 60 tablet 6  . HYDROcodone-acetaminophen (NORCO) 5-325 MG tablet Take 1 tablet by mouth every 6 (six) hours as needed for moderate pain. 40 tablet 0  . hydrocortisone 2.5 % cream Apply topically 2 (two) times daily. 30 g 0  . lisinopril (PRINIVIL,ZESTRIL) 40 MG tablet Take 1 tablet (40 mg total) by mouth daily. 90 tablet 0  . metformin (FORTAMET) 500 MG (OSM) 24 hr tablet Take 1 tablet (500 mg total) by mouth daily with breakfast. 90 tablet 1  . Multiple Vitamin (MULTIVITAMIN) capsule Take 1 capsule by mouth daily.      . nebivolol (BYSTOLIC) 10 MG tablet Take 10 mg by mouth daily. Take 20mg  by mouth every morning and take 10 mg by mouth every evening    . nitroGLYCERIN (NITROSTAT) 0.4 MG SL tablet Place 1 tablet (0.4 mg total) under the tongue every 5 (five) minutes as needed for chest pain. 25 tablet 4  . ondansetron (ZOFRAN ODT) 4 MG disintegrating tablet Take 1 tablet (4 mg total) by mouth every 8 (eight) hours as needed for nausea or vomiting. 12 tablet 1  . Polyethyl Glycol-Propyl Glycol (SYSTANE) 0.4-0.3 % GEL Place 1 drop into both eyes daily as needed (dry eyes).     . Potassium 99 MG TABS Take 99 mg by mouth daily.     . pregabalin (LYRICA) 100 MG capsule Take 100 mg by mouth as needed (FIBROMYALGIA).    [name] promethazine (PHENERGAN) 12.5 MG tablet Take 1 tablet (12.5 mg total) by mouth every 6 (six) hours as needed for nausea. 10 tablet 1  . ranitidine (ZANTAC) 300 MG tablet Take 1 tablet (300 mg total) by mouth at bedtime. 90 tablet 0   No current facility-administered medications for this visit.    Allergies as of 08/09/2015 - Review Complete 08/09/2015  Allergen Reaction Noted  . Bactrim [sulfamethoxazole-trimethoprim] Other (See Comments) 09/15/2013  . Cefuroxime axetil  02/07/2006  . Oxycodone Nausea And Vomiting 12/07/2010  . Pravastatin Other (See Comments) 04/02/2013  . Seldane [terfenadine]  02/07/2006  . Zocor [simvastatin]  01/23/2013  .  Tramadol Other (See Comments) 10/05/2009    Family History  Problem Relation Age of Onset  . Allergies Sister   . Parkinsonism Sister     possible  . Asthma Sister   . Asthma Paternal Grandmother   . Heart disease Father   . Heart disease Mother   . Lung cancer Mother   . Heart disease      paternal grandparents, maternal grandparents,   . Heart disease Brother   .  Emphysema Brother   . Aneurysm Brother     x3  . Kidney failure Brother   . Diabetes Brother   . Breast cancer Neg Hx   . Colon cancer Neg Hx   . Diabetes Brother   . Heart attack Neg Hx   . Stroke Brother   . Stroke Maternal Grandmother   . Stroke Paternal Grandmother     Social History   Social History  . Marital Status: Married    Spouse Name: Ilona Sorrel  . Number of Children: 2  . Years of Education: masters   Occupational History  . Retired, disable since 2000    Social History Main Topics  . Smoking status: Former Smoker -- 0.25 packs/day for 5 years    Types: Cigarettes    Quit date: 01/29/1998  . Smokeless tobacco: Never Used     Comment: Quit in 2001  . Alcohol Use: No  . Drug Use: No  . Sexual Activity: No   Other Topics Concern  . Not on file   Social History Narrative   On disability since 2000--- also husband has MS   Education. College.   Right handed.     Physical Exam: BP 112/70 mmHg  Pulse 80  Ht 5\' 2"  (1.575 m)  Wt 194 lb (87.998 kg)  BMI 35.47 kg/m2 Constitutional: generally well-appearing Psychiatric: alert and oriented x3 Abdomen: soft, nontender, nondistended, no obvious ascites, no peritoneal signs, normal bowel sounds   Assessment and plan: 75 y.o. female with Improved diarrhea, minor rectal bleeding, personal history of colon cancer  I recommended that she get back on the cholestyramine powder. It seems to have been helping her loose stools. I explained to her that she can take this daily and only hold if she gets constipated. She has minor rectal bleeding.  Colonoscopy last year found no clear rectal anal lesions however she did have some minor inflammation apparently. The biopsies showed no sign of neoplasm or proctitis. She understands that she needs a repeat screening, surveillance colonoscopy October of this year which will be one year since her colon cancer surgery.   Owens Loffler, MD [facility] 08/09/2015, 11:23 AM

## 2015-08-09 NOTE — Patient Instructions (Signed)
Recall colonoscopy 10/2015 for personal history of colon cancer. Take the cholestyramine powder every day, only stop if you get constipated.

## 2015-08-22 ENCOUNTER — Encounter: Payer: Self-pay | Admitting: Physician Assistant

## 2015-08-22 ENCOUNTER — Encounter: Payer: Self-pay | Admitting: Gastroenterology

## 2015-08-22 DIAGNOSIS — I2510 Atherosclerotic heart disease of native coronary artery without angina pectoris: Secondary | ICD-10-CM | POA: Insufficient documentation

## 2015-08-22 NOTE — Progress Notes (Signed)
Cardiology Office Note    Date:  08/23/2015  ID:  Samantha [name], Samantha [name] 02/26/1940, MRN [id number] PCP:  [name], MD  Cardiologist:  Meda Coffee  Chief Complaint: f/u CP, SOB  History of Present Illness:  Samantha [name] is a 75 y.o. female with history of CAD (moderate nonobstructive disease by cardiac CT 10/2014), TIA (on Plavix), chronic diastolic CHF, DM2, HTN, HL, fibromyalgia, palpitations secondary to symptomatic PVCs, LE edema, colon cancer (s/p lap partial colectomy 10/2014), reactive airway disease who presents for follow-up of SOB and dizziness.   Per review of chart, she has a history of CP in 2016 felt MSK but nuclear stress test was abnormal. She underwent cardiac CT 10/2014 showing 50-69% in the prox and mid LAD. She went on to have unremarkable colon surgery. 2D echo 09/2014: focal basal and moderate concentric hypertrophy, EF 60-65%, grade 1 DD. She saw [name] on 07/07/15 with complaints of dizziness and SOB. This began after remodeling her home with removal of carpet and a lot of dust. EKG was unchanged from prior. BP was 124/68. [name] recommended holding hydralazine and reassessing for need for further testing. CMET 6/7 unremarkable, LDL 73. I saw her in clinic 07/25/15 at which time she was reporting similar sx. Hgb was stable and BNP was negative. CTA neg for PE or other acute disease, no change. Nuc 08/03/15 was normal.   She presents back for follow-up. She is doing fine. She feels much better. No further CP or SOB. She wonders if her original sx were due to the home renovations. Her rectal bleeding has improved after treatment by GI.     Past Medical History:  Diagnosis Date  . Abnormal CT of the chest 2008   last CT4-l 2009:  . No f/u suggested   . Asthma   . CAD (coronary artery disease)    a. Coronary CTA 10/16: Coronary Ca score 211, mod non-obstructive CAD with LM mild plaque (25-50%), mid LAD 50-69%. b. Neg nuc 06/2015.  [name] Chronic diastolic CHF  (congestive heart failure) ([place])   . Collagen vascular disease ([place])    "arterial sclerosis" per pt  . Complication of anesthesia    trouble waking up  . Fibromyalgia   . GERD (gastroesophageal reflux disease)   . H/O hiatal hernia   . Hyperlipidemia   . Hypertension   . Malignant neoplasm of ascending colon ([place]) 2016   Minimally invasive right hemicolectomy to be done   . Ocular migraine   . OSA (obstructive sleep apnea) 09/2007   dx w/ a sleep study, not on  CPAP  . Osteoarthritis   . Pneumonia    "double" in 2004  . PONV (postoperative nausea and vomiting)   . Reactive airway disease 01/29/2002   dx of pseudoasthma / vcd in 2005 and nl sprirometry History of dyspnea, 2011,  improved after several medications were changed around Question of COPD, disproved July 06, 2009 with nl pft's      . Rheumatoid factor positive   . Shingles 11/2009  . TIA (transient ischemic attack)    x2 - on Plavix for this  . Torn rotator cuff    right worse than left, both are torn  . Tumor, 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  . Type II diabetes mellitus ([place])   . 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 Vaginal dysplasia     Past Surgical History:  Procedure Laterality Date  . ABDOMINAL HYSTERECTOMY  1980   NO oophorectomy per pt   . ANTERIOR CERVICAL DECOMP/DISCECTOMY FUSION  2001   C 3, C4 and C5 plate and screws  . BREAST BIOPSY Right 1999  . BUNIONECTOMY Left ~ 1977  . CATARACT EXTRACTION W/ INTRAOCULAR LENS  IMPLANT, BILATERAL  2012  . COLON SURGERY  10/2014  . EYE SURGERY Bilateral    torq lens for cataracts  . THYROIDECTOMY, PARTIAL  1960's  . VAGINAL MASS EXCISION  1994   "Laser surgery for vaginal cancer; followed by chemotherapy" (06/27/2012)    Current Medications: Current Outpatient Prescriptions  Medication Sig Dispense Refill  . acetaminophen (TYLENOL) 500 MG tablet Take 1,000 mg by mouth every 6 (six) hours as needed for moderate pain.    [name] albuterol (PROVENTIL HFA;VENTOLIN  HFA) 108 (90 Base) MCG/ACT inhaler Inhale 2 puffs into the lungs every 6 (six) hours as needed for wheezing or shortness of breath. 18 g 3  . atorvastatin (LIPITOR) 20 MG tablet Take 1 tablet (20 mg total) by mouth daily. 90 tablet 3  . butalbital-acetaminophen-caffeine (FIORICET) 50-325-40 MG tablet Take 1 tablet by mouth every 6 (six) hours as needed for headache or migraine. 14 tablet 5  . CALCIUM PO Take 1 tablet by mouth every morning.     . cetirizine (ZYRTEC) 10 MG tablet Take 1 tablet (10 mg total) by mouth daily. 90 tablet 1  . cholecalciferol (VITAMIN D) 1000 UNITS tablet Take 1,000 Units by mouth every morning.     . clopidogrel (PLAVIX) 75 MG tablet Take 1 tablet (75 mg total) by mouth daily. 90 tablet 1  . furosemide (LASIX) 20 MG tablet Take 1 tablet (20 mg total) by mouth 2 (two) times daily. 60 tablet 6  . HYDROcodone-acetaminophen (NORCO) 5-325 MG tablet Take 1 tablet by mouth every 6 (six) hours as needed for moderate pain. 40 tablet 0  . hydrocortisone 2.5 % cream Apply topically 2 (two) times daily. 30 g 0  . lisinopril (PRINIVIL,ZESTRIL) 40 MG tablet Take 1 tablet (40 mg total) by mouth daily. 90 tablet 0  . metformin (FORTAMET) 500 MG (OSM) 24 hr tablet Take 1 tablet (500 mg total) by mouth daily with breakfast. 90 tablet 1  . Multiple Vitamin (MULTIVITAMIN) capsule Take 1 capsule by mouth daily.      . nebivolol (BYSTOLIC) 10 MG tablet Take 10 mg by mouth daily. Take 20mg  by mouth every morning and take 10 mg by mouth every evening    . nitroGLYCERIN (NITROSTAT) 0.4 MG SL tablet Place 0.4 mg under the tongue every 5 (five) minutes as needed for chest pain (x 3 doses).    . ondansetron (ZOFRAN ODT) 4 MG disintegrating tablet Take 1 tablet (4 mg total) by mouth every 8 (eight) hours as needed for nausea or vomiting. 12 tablet 1  . Polyethyl Glycol-Propyl Glycol (SYSTANE) 0.4-0.3 % GEL Place 1 drop into both eyes daily as needed (dry eyes).     . Potassium 99 MG TABS Take 99 mg  by mouth daily.     . pregabalin (LYRICA) 100 MG capsule Take 100 mg by mouth as needed (FIBROMYALGIA).    [name] promethazine (PHENERGAN) 12.5 MG tablet Take 1 tablet (12.5 mg total) by mouth every 6 (six) hours as needed for nausea. 10 tablet 1  . ranitidine (ZANTAC) 300 MG tablet Take 1 tablet (300 mg total) by mouth at bedtime. 90 tablet 0   No current facility-administered medications for this visit.      Allergies:  Bactrim [sulfamethoxazole-trimethoprim]; Cefuroxime axetil; Oxycodone; Pravastatin; Seldane [terfenadine]; Zocor [simvastatin]; and Tramadol   Social History   Social History  . Marital status: Married    Spouse name: Ilona Sorrel  . Number of children: 2  . Years of education: masters   Occupational History  . Retired, disable since 2000 Retired   Social History Main Topics  . Smoking status: Former Smoker    Packs/day: 0.25    Years: 5.00    Types: Cigarettes    Quit date: 01/29/1998  . Smokeless tobacco: Never Used     Comment: Quit in 2001  . Alcohol use No  . Drug use: No  . Sexual activity: No   Other Topics Concern  . None   Social History Narrative   On disability since 2000--- also husband has MS   Education. College.   Right handed.     Family History:  The patient's family history includes Allergies in her sister; Aneurysm in her brother; Asthma in her paternal grandmother and sister; Diabetes in her brother and brother; Emphysema in her brother; Heart disease in her brother, father, and mother; Kidney failure in her brother; Lung cancer in her mother; Parkinsonism in her sister; Stroke in her brother, maternal grandmother, and paternal grandmother.  ROS:   Please see the history of present illness.  All other systems are reviewed and otherwise negative.    PHYSICAL EXAM:   VS:  BP 120/72 (BP Location: Right Arm)   Pulse 85   Ht 5\' 2"  (1.575 m)   Wt 191 lb 12.8 oz (87 kg)   SpO2 98%   BMI 35.08 kg/m   BMI: Body mass index is 35.08 kg/m. GEN:  Well nourished, well developed F, in no acute distress  HEENT: normocephalic, atraumatic Neck: no JVD, carotid bruits, or masses Cardiac: RRR; no murmurs, rubs, or gallops, no edema  Respiratory:  clear to auscultation bilaterally, normal work of breathing GI: soft, nontender, nondistended, + BS MS: no deformity or atrophy  Skin: warm and dry, no rash Neuro:  Alert and Oriented x 3, Strength and sensation are intact, follows commands Psych: euthymic mood, full affect  Wt Readings from Last 3 Encounters:  08/23/15 191 lb 12.8 oz (87 kg)  08/09/15 194 lb (88 kg)  08/03/15 193 lb (87.5 kg)      Studies/Labs Reviewed:   EKG:  EKG was not ordered today  Recent Labs: 07/06/2015: ALT 13; BUN 16; Creat 0.84; Potassium 3.9; Sodium 140 07/25/2015: Brain Natriuretic Peptide 31.8; Hemoglobin 12.9; Platelets 142; TSH 2.22   Lipid Panel    Component Value Date/Time   CHOL 169 07/06/2015 0916   TRIG 109 07/06/2015 0916   HDL 74 07/06/2015 0916   CHOLHDL 2.3 07/06/2015 0916   VLDL 22 07/06/2015 0916   LDLCALC 73 07/06/2015 0916   LDLDIRECT 126.2 12/07/2010 1039    Additional studies/ records that were reviewed today include: Summarized above    ASSESSMENT & PLAN:   1. CP/SOB - resolved. Suspect reactive airway disease related to home renovation. If symptoms recur, would consider escalation of pulmonary regimen. 2. CAD - low risk nuclear. She is on Plavix already for TIA. Continue statin and BB. We discussed importance of risk factor reduction such as following a heart healthy diet. She will notify us for recurrent sx. 3. Chronic diastolic CHF - remains euvolemic at this time. 4. Essential HTN - controlled.  Disposition: F/u with [name] in 6-8 months or as needed.   Medication Adjustments/Labs and Tests  Ordered: Current medicines are reviewed at length with the patient today.  Concerns regarding medicines are outlined above. Medication changes, Labs and Tests ordered today are  summarized above and listed in the Patient Instructions accessible in Encounters.   Raechel Ache PA-C  08/23/2015 10:08 AM    [address], [place], [place]  [address] Phone: [phone]; Fax: [phone]

## 2015-08-23 ENCOUNTER — Ambulatory Visit (INDEPENDENT_AMBULATORY_CARE_PROVIDER_SITE_OTHER): Payer: Medicare Other | Admitting: Physician Assistant

## 2015-08-23 ENCOUNTER — Encounter: Payer: Self-pay | Admitting: Physician Assistant

## 2015-08-23 VITALS — BP 120/72 | HR 85 | Ht 62.0 in | Wt 191.8 lb

## 2015-08-23 DIAGNOSIS — I10 Essential (primary) hypertension: Secondary | ICD-10-CM

## 2015-08-23 DIAGNOSIS — I2510 Atherosclerotic heart disease of native coronary artery without angina pectoris: Secondary | ICD-10-CM

## 2015-08-23 DIAGNOSIS — I5032 Chronic diastolic (congestive) heart failure: Secondary | ICD-10-CM | POA: Diagnosis not present

## 2015-08-23 DIAGNOSIS — R0602 Shortness of breath: Secondary | ICD-10-CM | POA: Diagnosis not present

## 2015-08-23 DIAGNOSIS — R079 Chest pain, unspecified: Secondary | ICD-10-CM

## 2015-08-23 NOTE — Patient Instructions (Addendum)
Medication Instructions:  Your physician recommends that you continue on your current medications as directed. Please refer to the Current Medication list given to you today.   Labwork: None ordered  Testing/Procedures: None ordered  Follow-Up: Your physician wants you to follow-up in: 6-8 Luke will receive a reminder letter in the mail two months in advance. If you don't receive a letter, please call our office to schedule the follow-up appointment.    Any Other Special Instructions Will Be Listed Below (If Applicable).     If you need a refill on your cardiac medications before your next appointment, please call your pharmacy.

## 2015-09-07 DIAGNOSIS — M25561 Pain in right knee: Secondary | ICD-10-CM | POA: Diagnosis not present

## 2015-09-07 DIAGNOSIS — M19041 Primary osteoarthritis, right hand: Secondary | ICD-10-CM | POA: Diagnosis not present

## 2015-09-07 DIAGNOSIS — M65331 Trigger finger, right middle finger: Secondary | ICD-10-CM | POA: Diagnosis not present

## 2015-09-07 DIAGNOSIS — M19042 Primary osteoarthritis, left hand: Secondary | ICD-10-CM | POA: Diagnosis not present

## 2015-09-07 DIAGNOSIS — M797 Fibromyalgia: Secondary | ICD-10-CM | POA: Diagnosis not present

## 2015-09-15 DIAGNOSIS — M1711 Unilateral primary osteoarthritis, right knee: Secondary | ICD-10-CM | POA: Diagnosis not present

## 2015-09-15 DIAGNOSIS — M797 Fibromyalgia: Secondary | ICD-10-CM | POA: Diagnosis not present

## 2015-09-15 DIAGNOSIS — M65331 Trigger finger, right middle finger: Secondary | ICD-10-CM | POA: Diagnosis not present

## 2015-10-06 ENCOUNTER — Telehealth: Payer: Self-pay | Admitting: Internal Medicine

## 2015-10-10 ENCOUNTER — Other Ambulatory Visit: Payer: Self-pay | Admitting: Internal Medicine

## 2015-10-11 ENCOUNTER — Ambulatory Visit: Payer: Medicare Other | Admitting: Adult Health

## 2015-10-18 ENCOUNTER — Other Ambulatory Visit: Payer: Self-pay | Admitting: Internal Medicine

## 2015-10-19 ENCOUNTER — Other Ambulatory Visit (INDEPENDENT_AMBULATORY_CARE_PROVIDER_SITE_OTHER): Payer: Medicare Other

## 2015-10-19 ENCOUNTER — Encounter: Payer: Self-pay | Admitting: Internal Medicine

## 2015-10-19 ENCOUNTER — Ambulatory Visit (INDEPENDENT_AMBULATORY_CARE_PROVIDER_SITE_OTHER): Payer: Medicare Other | Admitting: Internal Medicine

## 2015-10-19 VITALS — BP 104/64 | HR 64 | Temp 98.2°F | Resp 16 | Ht 62.0 in | Wt 193.0 lb

## 2015-10-19 DIAGNOSIS — Z794 Long term (current) use of insulin: Secondary | ICD-10-CM

## 2015-10-19 DIAGNOSIS — J019 Acute sinusitis, unspecified: Secondary | ICD-10-CM

## 2015-10-19 DIAGNOSIS — Z23 Encounter for immunization: Secondary | ICD-10-CM

## 2015-10-19 DIAGNOSIS — M722 Plantar fascial fibromatosis: Secondary | ICD-10-CM

## 2015-10-19 DIAGNOSIS — M25561 Pain in right knee: Secondary | ICD-10-CM

## 2015-10-19 DIAGNOSIS — B9689 Other specified bacterial agents as the cause of diseases classified elsewhere: Secondary | ICD-10-CM

## 2015-10-19 DIAGNOSIS — J4520 Mild intermittent asthma, uncomplicated: Secondary | ICD-10-CM

## 2015-10-19 DIAGNOSIS — I2510 Atherosclerotic heart disease of native coronary artery without angina pectoris: Secondary | ICD-10-CM

## 2015-10-19 DIAGNOSIS — I2583 Coronary atherosclerosis due to lipid rich plaque: Secondary | ICD-10-CM

## 2015-10-19 DIAGNOSIS — E11311 Type 2 diabetes mellitus with unspecified diabetic retinopathy with macular edema: Secondary | ICD-10-CM

## 2015-10-19 DIAGNOSIS — M797 Fibromyalgia: Secondary | ICD-10-CM

## 2015-10-19 DIAGNOSIS — Z85038 Personal history of other malignant neoplasm of large intestine: Secondary | ICD-10-CM

## 2015-10-19 LAB — COMPREHENSIVE METABOLIC PANEL
ALT: 18 U/L (ref 0–35)
AST: 16 U/L (ref 0–37)
Albumin: 4.1 g/dL (ref 3.5–5.2)
Alkaline Phosphatase: 59 U/L (ref 39–117)
BILIRUBIN TOTAL: 0.6 mg/dL (ref 0.2–1.2)
BUN: 20 mg/dL (ref 6–23)
CO2: 32 meq/L (ref 19–32)
CREATININE: 0.93 mg/dL (ref 0.40–1.20)
Calcium: 9.1 mg/dL (ref 8.4–10.5)
Chloride: 103 mEq/L (ref 96–112)
GFR: 75.91 mL/min (ref >60.00)
GLUCOSE: 119 mg/dL — AB (ref 70–99)
Potassium: 3.9 mEq/L (ref 3.5–5.1)
SODIUM: 141 meq/L (ref 135–145)
Total Protein: 7.4 g/dL (ref 6.0–8.3)

## 2015-10-19 LAB — HEMOGLOBIN A1C: HEMOGLOBIN A1C: 6.7 % — AB (ref 4.6–6.5)

## 2015-10-19 MED ORDER — CLOPIDOGREL BISULFATE 75 MG PO TABS: 75.0000 mg | ORAL_TABLET | Freq: Every day | ORAL | 3 refills | Status: DC

## 2015-10-19 MED ORDER — METFORMIN HCL ER (OSM) 500 MG PO TB24: 500.0000 mg | ORAL_TABLET | Freq: Every day | ORAL | 1 refills | Status: DC

## 2015-10-19 MED ORDER — RANITIDINE HCL 300 MG PO TABS: 300.0000 mg | ORAL_TABLET | Freq: Every day | ORAL | 3 refills | Status: DC

## 2015-10-19 NOTE — Assessment & Plan Note (Signed)
osteoarthritis Has seen ortho Advised to try a knee brace

## 2015-10-19 NOTE — Assessment & Plan Note (Signed)
Taking lyrica Controlled, stable

## 2015-10-19 NOTE — Assessment & Plan Note (Signed)
Asymptomatic Continue statin, plavix

## 2015-10-19 NOTE — Progress Notes (Signed)
Pre visit review using our clinic review tool, if applicable. No additional management support is needed unless otherwise documented below in the visit note. 

## 2015-10-19 NOTE — Progress Notes (Signed)
Subjective:    Patient ID: Samantha [name], female    DOB: [birth year], 75 y.o.   MRN: [id number]  HPI The patient is here for follow up.   H/o colon cancer:  She has surgery in October and she still has intermittent pain at the site of the scar. She sometimes it comes from lifting.  She follows up with Dr Ardis Hughs next month. She has diarrhea daily.   She occasionally has right middle back pain:  She wonders if it is in her kidney.  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  She did have a Ct scan 08/2014 that showed a simple cyst in the right kidney and none in the left.   Knee osteoarthritis:  She has seen rheum.  She has a prescription for a knee brace.  She has seen ortho.    Diabetes: She is taking her medication daily as prescribed. She is compliant with a diabetic diet. She is exercising regularly - stretch bands, walking. She monitors her sugars and they have been running in low 100s. She checks her feet daily and denies foot lesions. She is up-to-date with an ophthalmology examination.     Medications and allergies reviewed with patient and updated if appropriate.  Patient Active Problem List   Diagnosis Date Noted  . CAD in native artery 08/22/2015  . Hypertensive heart disease 07/07/2015  . Essential hypertension 07/07/2015  . SOB (shortness of breath) 07/07/2015  . Dizziness 07/07/2015  . Plantar fasciitis, left 03/22/2015  . Acute bacterial sinusitis 02/11/2015  . Hx of colon cancer, stage I 01/11/2015  . Coronary artery disease due to lipid rich plaque 01/05/2015  . Chronic diastolic CHF (congestive heart failure), NYHA class 2 ([place]) 01/05/2015  . 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 PCP NOTES >>>>>>>>>>>>>>>>>>>>>>>>> 11/02/2014  . Chest pain 04/21/2014  . Migraine (Ocular) 01/05/2014  . Gait difficulty 10/06/2013  . Pain in joint, shoulder region 11/27/2012  . VBI  (vertebrobasilar insufficiency) 08/22/2012  . Right knee pain 07/29/2012  . TIA (transient ischemic attack) 06/27/2012  . Edema 02/01/2012  . DJD (degenerative joint disease) 02/02/2011  . 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 Postherpetic neuralgia ? 01/02/2010  . NECK PAIN 10/05/2009  . DYSPNEA ON EXERTION 12/06/2008  . Palpitations 11/23/2008  . UTI'S, RECURRENT 09/28/2008  . Fibromyalgia 08/15/2007  . FATIGUE 11/19/2006  . 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 Hyperlipidemia 05/21/2006  . HTN (hypertension) 05/21/2006  . Reactive airway disease 01/29/2002    Current Outpatient Prescriptions on File Prior to Visit  Medication Sig Dispense Refill  . acetaminophen (TYLENOL) 500 MG tablet Take 1,000 mg by mouth every 6 (six) hours as needed for moderate pain.    [name] albuterol (PROVENTIL HFA;VENTOLIN HFA) 108 (90 Base) MCG/ACT inhaler Inhale 2 puffs into the lungs every 6 (six) hours as needed for wheezing or shortness of breath. 18 g 3  . atorvastatin (LIPITOR) 20 MG tablet Take 1 tablet (20 mg total) by mouth daily. 90 tablet 3  . butalbital-acetaminophen-caffeine (FIORICET) 50-325-40 MG tablet Take 1 tablet by mouth every 6 (six) hours as needed for headache or migraine. 14 tablet 5  . CALCIUM PO Take 1 tablet by mouth every morning.     . cetirizine (ZYRTEC) 10 MG tablet Take 1 tablet (10 mg total) by mouth daily. 90 tablet 1  .  cholecalciferol (VITAMIN D) 1000 UNITS tablet Take 1,000 Units by mouth every morning.     . clopidogrel (PLAVIX) 75 MG tablet Take 1 tablet (75 mg total) by mouth daily. 90 tablet 1  . furosemide (LASIX) 20 MG tablet Take 1 tablet (20 mg total) by mouth 2 (two) times daily. 60 tablet 6  . HYDROcodone-acetaminophen (NORCO) 5-325 MG tablet Take 1 tablet by mouth every 6 (six) hours as needed for moderate pain. 40 tablet 0  . hydrocortisone 2.5 % cream Apply topically 2 (two) times daily. 30 g 0  . lisinopril (PRINIVIL,ZESTRIL) 40 MG  tablet Take 1 tablet (40 mg total) by mouth daily. 90 tablet 0  . metformin (FORTAMET) 500 MG (OSM) 24 hr tablet TAKE 1 TABLET (500 MG TOTAL) BY MOUTH DAILY WITH BREAKFAST. 90 tablet 1  . Multiple Vitamin (MULTIVITAMIN) capsule Take 1 capsule by mouth daily.      . nebivolol (BYSTOLIC) 10 MG tablet Take 10 mg by mouth daily. Take 20mg  by mouth every morning and take 10 mg by mouth every evening    . nitroGLYCERIN (NITROSTAT) 0.4 MG SL tablet Place 0.4 mg under the tongue every 5 (five) minutes as needed for chest pain (x 3 doses).    . ondansetron (ZOFRAN ODT) 4 MG disintegrating tablet Take 1 tablet (4 mg total) by mouth every 8 (eight) hours as needed for nausea or vomiting. 12 tablet 1  . Polyethyl Glycol-Propyl Glycol (SYSTANE) 0.4-0.3 % GEL Place 1 drop into both eyes daily as needed (dry eyes).     . Potassium 99 MG TABS Take 99 mg by mouth daily.     . pregabalin (LYRICA) 100 MG capsule Take 100 mg by mouth as needed (FIBROMYALGIA).    [name] promethazine (PHENERGAN) 12.5 MG tablet Take 1 tablet (12.5 mg total) by mouth every 6 (six) hours as needed for nausea. 10 tablet 1  . ranitidine (ZANTAC) 300 MG tablet Take 1 tablet (300 mg total) by mouth at bedtime. 90 tablet 0   No current facility-administered medications on file prior to visit.     Past Medical History:  Diagnosis Date  . Abnormal CT of the chest 2008   last CT4-l 2009:  . No f/u suggested   . Asthma   . CAD (coronary artery disease)    a. Coronary CTA 10/16: Coronary Ca score 211, mod non-obstructive CAD with LM mild plaque (25-50%), mid LAD 50-69%. b. Neg nuc 06/2015.  [name] Chronic diastolic CHF (congestive heart failure) ([place])   . Collagen vascular disease ([place])    "arterial sclerosis" per pt  . Complication of anesthesia    trouble waking up  . Fibromyalgia   . GERD (gastroesophageal reflux disease)   . H/O hiatal hernia   . Hyperlipidemia   . Hypertension   . Malignant neoplasm of ascending colon ([place]) 2016   Minimally  invasive right hemicolectomy to be done   . Ocular migraine   . OSA (obstructive sleep apnea) 09/2007   dx w/ a sleep study, not on  CPAP  . Osteoarthritis   . Pneumonia    "double" in 2004  . PONV (postoperative nausea and vomiting)   . Reactive airway disease 01/29/2002   dx of pseudoasthma / vcd in 2005 and nl sprirometry History of dyspnea, 2011,  improved after several medications were changed around Question of COPD, disproved July 06, 2009 with nl pft's      . Rheumatoid factor positive   . Shingles 11/2009  .  TIA (transient ischemic attack)    x2 - on Plavix for this  . Torn rotator cuff    right worse than left, both are torn  . Tumor, 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  . Type II diabetes mellitus ([place])   . 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 Vaginal dysplasia     Past Surgical History:  Procedure Laterality Date  . ABDOMINAL HYSTERECTOMY  1980   NO oophorectomy per pt   . ANTERIOR CERVICAL DECOMP/DISCECTOMY FUSION  2001   C 3, C4 and C5 plate and screws  . BREAST BIOPSY Right 1999  . BUNIONECTOMY Left ~ 1977  . CATARACT EXTRACTION W/ INTRAOCULAR LENS  IMPLANT, BILATERAL  2012  . COLON SURGERY  10/2014  . EYE SURGERY Bilateral    torq lens for cataracts  . THYROIDECTOMY, PARTIAL  1960's  . VAGINAL MASS EXCISION  1994   "Laser surgery for vaginal cancer; followed by chemotherapy" (06/27/2012)    Social History   Social History  . Marital status: Married    Spouse name: Ilona Sorrel  . Number of children: 2  . Years of education: masters   Occupational History  . Retired, disable since 2000 Retired   Social History Main Topics  . Smoking status: Former Smoker    Packs/day: 0.25    Years: 5.00    Types: Cigarettes    Quit date: 01/29/1998  . Smokeless tobacco: Never Used     Comment: Quit in 2001  . Alcohol use No  . Drug use: No  . Sexual activity: No   Other Topics Concern  . Not on file   Social History Narrative   On disability since 2000--- also  husband has MS   Education. College.   Right handed.    Family History  Problem Relation Age of Onset  . Allergies Sister   . Parkinsonism Sister     possible  . Asthma Sister   . Asthma Paternal Grandmother   . Heart disease Father   . Heart disease Mother   . Lung cancer Mother   . Heart disease      paternal grandparents, maternal grandparents,   . Heart disease Brother   . Emphysema Brother   . Aneurysm Brother     x3  . Kidney failure Brother   . Diabetes Brother   . Breast cancer Neg Hx   . Colon cancer Neg Hx   . Diabetes Brother   . Heart attack Neg Hx   . Stroke Brother   . Stroke Maternal Grandmother   . Stroke Paternal Grandmother     Review of Systems  Constitutional: Negative for chills and fever.  Eyes: Negative for visual disturbance.  Respiratory: Positive for cough (often), shortness of breath (with exertion, mild) and wheezing (often).   Cardiovascular: Positive for palpitations (occasional) and leg swelling (controlled with lasix). Negative for chest pain.  Gastrointestinal: Positive for nausea (occasional migraines).  Genitourinary: Negative for dysuria and hematuria.  Musculoskeletal: Positive for arthralgias (knee pain), neck pain (s/p cervical fusion) and neck stiffness.  Neurological: Positive for dizziness (occ) and headaches (migraines, occasional posterior headaches). Negative for light-headedness.  Psychiatric/Behavioral: Negative for dysphoric mood. The patient is not nervous/anxious.        Objective:   Vitals:   10/19/15 0958  BP: 104/64  Pulse: 64  Resp: 16  Temp: 98.2 F (36.8 C)   Filed Weights   10/19/15 0958  Weight: 193 lb (87.5 kg)   Body  mass index is 35.3 kg/m.   Physical Exam    Constitutional: Appears well-developed and well-nourished. No distress.  HENT:  Head: Normocephalic and atraumatic.  Neck: Neck supple. No tracheal deviation present. No thyromegaly present.  Cardiovascular: Normal rate, regular  rhythm and normal heart sounds.   No murmur heard. No carotid bruit  Pulmonary/Chest: Effort normal and breath sounds normal. No respiratory distress. No has no wheezes. No rales.  Musculoskeletal: No edema.  Lymphadenopathy: No cervical adenopathy.  Skin: Skin is warm and dry. Not diaphoretic.  Psychiatric: Normal mood and affect. Behavior is normal.     Assessment & Plan:    See Problem List for Assessment and Plan of chronic medical problems.   F/u in 6 months

## 2015-10-19 NOTE — Assessment & Plan Note (Addendum)
Uses albuterol as needed for wheezing Wheezing triggered by dust

## 2015-10-19 NOTE — Assessment & Plan Note (Signed)
Taking metformin once daily  Lab Results  Component Value Date   HGBA1C 6.7 (H) 10/19/2015   Sugars well controlled Continue metformin Check a1c today

## 2015-10-19 NOTE — Assessment & Plan Note (Signed)
S/p surgery October 2016 Has follow up with Dr Ardis Hughs

## 2015-10-19 NOTE — Patient Instructions (Addendum)
  Test(s) ordered today. Your results will be released to MyChart (or called to you) after review, usually within 72hours after test completion. If any changes need to be made, you will be notified at that same time.  All other Health Maintenance issues reviewed.   All recommended immunizations and age-appropriate screenings are up-to-date or discussed.  Flu vaccine administered today.   Medications reviewed and updated.  No changes recommended at this time.  Your prescription(s) have been submitted to your pharmacy. Please take as directed and contact our office if you believe you are having problem(s) with the medication(s).   Please followup in 6 months   

## 2015-10-19 NOTE — Assessment & Plan Note (Signed)
Acts up on occasion Advised on home exercises Hydrocodone as needed Has seen podiatry

## 2015-10-20 ENCOUNTER — Encounter: Payer: Self-pay | Admitting: Adult Health

## 2015-10-20 ENCOUNTER — Ambulatory Visit (INDEPENDENT_AMBULATORY_CARE_PROVIDER_SITE_OTHER): Payer: Medicare Other | Admitting: Adult Health

## 2015-10-20 VITALS — BP 139/84 | HR 73 | Ht 62.0 in | Wt 196.0 lb

## 2015-10-20 DIAGNOSIS — M542 Cervicalgia: Secondary | ICD-10-CM | POA: Diagnosis not present

## 2015-10-20 DIAGNOSIS — I2510 Atherosclerotic heart disease of native coronary artery without angina pectoris: Secondary | ICD-10-CM

## 2015-10-20 DIAGNOSIS — R519 Headache, unspecified: Secondary | ICD-10-CM

## 2015-10-20 DIAGNOSIS — R51 Headache: Secondary | ICD-10-CM | POA: Diagnosis not present
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
## 2015-10-20 NOTE — Patient Instructions (Signed)
Overall you are doing well.  If your symptoms worsen or you develop new symptoms please let us know.

## 2015-10-26 NOTE — Progress Notes (Signed)
I have reviewed and agreed above plan. 

## 2015-11-17 DIAGNOSIS — M797 Fibromyalgia: Secondary | ICD-10-CM | POA: Diagnosis not present

## 2015-11-17 DIAGNOSIS — M65331 Trigger finger, right middle finger: Secondary | ICD-10-CM | POA: Diagnosis not present

## 2015-11-17 DIAGNOSIS — M1711 Unilateral primary osteoarthritis, right knee: Secondary | ICD-10-CM | POA: Diagnosis not present

## 2015-11-18 ENCOUNTER — Telehealth: Payer: Self-pay | Admitting: *Deleted

## 2015-11-18 DIAGNOSIS — R42 Dizziness and giddiness: Secondary | ICD-10-CM

## 2015-11-18 DIAGNOSIS — I10 Essential (primary) hypertension: Secondary | ICD-10-CM

## 2015-11-18 DIAGNOSIS — R5383 Other fatigue: Secondary | ICD-10-CM

## 2015-11-18 DIAGNOSIS — I5032 Chronic diastolic (congestive) heart failure: Secondary | ICD-10-CM

## 2015-11-18 DIAGNOSIS — R0602 Shortness of breath: Secondary | ICD-10-CM

## 2015-11-18 DIAGNOSIS — I2510 Atherosclerotic heart disease of native coronary artery without angina pectoris: Secondary | ICD-10-CM

## 2015-11-18 NOTE — Telephone Encounter (Signed)
Left msg on triage requesting refill on her Lyrica.../lmb 

## 2015-11-21 MED ORDER — PREGABALIN 100 MG PO CAPS: 100.0000 mg | ORAL_CAPSULE | ORAL | 2 refills | Status: DC | PRN

## 2015-11-21 NOTE — Telephone Encounter (Signed)
Called refill [facility] had to leave on pharmacy vm. Called pt no answer LMOM refill been called to pharmacy...Samantha [name]

## 2015-11-21 NOTE — Telephone Encounter (Signed)
Ok to refill 

## 2015-11-28 ENCOUNTER — Other Ambulatory Visit: Payer: Self-pay | Admitting: Physician Assistant

## 2015-11-30 ENCOUNTER — Ambulatory Visit (INDEPENDENT_AMBULATORY_CARE_PROVIDER_SITE_OTHER): Payer: Medicare Other | Admitting: Gastroenterology

## 2015-11-30 ENCOUNTER — Telehealth: Payer: Self-pay

## 2015-11-30 ENCOUNTER — Encounter: Payer: Self-pay | Admitting: Gastroenterology

## 2015-11-30 VITALS — BP 140/80 | HR 76 | Ht 63.5 in | Wt 197.2 lb

## 2015-11-30 DIAGNOSIS — R1012 Left upper quadrant pain: Secondary | ICD-10-CM

## 2015-11-30 DIAGNOSIS — Z85038 Personal history of other malignant neoplasm of large intestine: Secondary | ICD-10-CM

## 2015-11-30 DIAGNOSIS — I2510 Atherosclerotic heart disease of native coronary artery without angina pectoris: Secondary | ICD-10-CM | POA: Diagnosis not present

## 2015-11-30 MED ORDER — NA SULFATE-K SULFATE-MG SULF 17.5-3.13-1.6 GM/177ML PO SOLN: 1.0000 | Freq: Once | ORAL | 0 refills | Status: AC

## 2015-11-30 NOTE — Patient Instructions (Addendum)
You will be set up for a colonoscopy for personal history of colon cancer.  We will communicate with your cardiologist [name] about the safety of holding your plavix for 5 days prior to the colonoscopy.  Referral back to Dr. Marcello Moores for pain at LUQ laparoscopy scar site, ? Small hernia.    You have been scheduled for an appointment with Dr Marcello Moores at [facility]. Your appointment is on 12/20/15 at 11 am. Please arrive at 1045 am for registration. Make certain to bring a list of current medications, including any over the counter medications or vitamins. Also bring your co-pay if you have one as well as your insurance cards. [facility] is located at [address]. Should you need to reschedule your appointment, please contact them at [phone].

## 2015-11-30 NOTE — Telephone Encounter (Signed)
11/30/2015   RE: Samantha [name] DOB: 1940/07/28 MRN: [id number]   Dear Dr Meda Coffee,    We have scheduled the above patient for an endoscopic procedure with Dr Ardis Hughs. Our records show that she is on anticoagulation therapy.   Please advise as to how long the patient may come off her therapy of plavix prior to the procedure, which is scheduled for 02/01/16.  Please fax back/ or route the completed form to Christian Mate RN at [phone].   Sincerely,    Christian Mate RN

## 2015-11-30 NOTE — Progress Notes (Signed)
Review of pertinent gastrointestinal problems: 1. Colon cancer; colonoscopy [name] 7 2016 done for minor rectal bleeding found a small descending colon firm polypoid lesion. This was resected with snare cautery and confirmed adenocarcinoma. She underwent robot-assisted right colectomy with [name] October 2016 and the pathologic specimen prove that there was no residual adenocarcinoma. This was a T1, N0 M0 right-sided colon cancer. 2. 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    HPI: This is a   very pleasant 75 year old woman whom I last saw 5 or 6 months ago.  Chief complaint is personal history colon cancer, left upper quadrant pain at surgical scar site  Has been taking cholestyramine powder every other day.  This works well for her.  Still sees minor rectal bleeding on TP  She has some pain at her left upper quadrant laparoscopy scar site. This is worse when she bends over to twist the left. Otherwise no abdominal discomforts  ROS: complete GI ROS as described in HPI.  Constitutional:  No unintentional weight loss   Past Medical History:  Diagnosis Date  . Abnormal CT of the chest 2008   last CT4-l 2009:  . No f/u suggested   . Asthma   . CAD (coronary artery disease)    a. Coronary CTA 10/16: Coronary Ca score 211, mod non-obstructive CAD with LM mild plaque (25-50%), mid LAD 50-69%. b. Neg nuc 06/2015.  [name] Chronic diastolic CHF (congestive heart failure) ([place])   . Collagen vascular disease ([place])    "arterial sclerosis" per pt  . Complication of anesthesia    trouble waking up  . Fibromyalgia   . GERD (gastroesophageal reflux disease)   . H/O hiatal hernia   . Hyperlipidemia   . Hypertension   . Malignant neoplasm of ascending colon ([place]) 2016   Minimally invasive right hemicolectomy to be done   . Ocular migraine   . OSA (obstructive sleep apnea) 09/2007   dx w/ a sleep study, not on  CPAP  . Osteoarthritis   .  Pneumonia    "double" in 2004  . PONV (postoperative nausea and vomiting)   . Reactive airway disease 01/29/2002   dx of pseudoasthma / vcd in 2005 and nl sprirometry History of dyspnea, 2011,  improved after several medications were changed around Question of COPD, disproved July 06, 2009 with nl pft's      . Rheumatoid factor positive   . Shingles 11/2009  . TIA (transient ischemic attack)    x2 - on Plavix for this  . Torn rotator cuff    right worse than left, both are torn  . Tumor, 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  . Type II diabetes mellitus ([place])   . 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 Vaginal dysplasia     Past Surgical History:  Procedure Laterality Date  . ABDOMINAL HYSTERECTOMY  1980   NO oophorectomy per pt   . ANTERIOR CERVICAL DECOMP/DISCECTOMY FUSION  2001   C 3, C4 and C5 plate and screws  . BREAST BIOPSY Right 1999  . BUNIONECTOMY Left ~ 1977  . CATARACT EXTRACTION W/ INTRAOCULAR LENS  IMPLANT, BILATERAL  2012  . COLON SURGERY  10/2014  . EYE SURGERY Bilateral    torq lens for cataracts  . THYROIDECTOMY, PARTIAL  1960's  . VAGINAL MASS EXCISION  1994   "Laser surgery for vaginal cancer; followed by chemotherapy" (06/27/2012)    Current Outpatient Prescriptions  Medication Sig  Dispense Refill  . colestipol (COLESTID) 5 g granules Take 5 g by mouth every other day.    [name] acetaminophen (TYLENOL) 500 MG tablet Take 1,000 mg by mouth every 6 (six) hours as needed for moderate pain.    [name] albuterol (PROVENTIL HFA;VENTOLIN HFA) 108 (90 Base) MCG/ACT inhaler Inhale 2 puffs into the lungs every 6 (six) hours as needed for wheezing or shortness of breath. 18 g 3  . atorvastatin (LIPITOR) 20 MG tablet Take 1 tablet (20 mg total) by mouth daily. 90 tablet 3  . butalbital-acetaminophen-caffeine (FIORICET) 50-325-40 MG tablet Take 1 tablet by mouth every 6 (six) hours as needed for headache or migraine. 14 tablet 5  . CALCIUM PO Take 1 tablet by mouth every morning.      . cetirizine (ZYRTEC) 10 MG tablet Take 1 tablet (10 mg total) by mouth daily. 90 tablet 1  . cholecalciferol (VITAMIN D) 1000 UNITS tablet Take 1,000 Units by mouth every morning.     . clopidogrel (PLAVIX) 75 MG tablet Take 1 tablet (75 mg total) by mouth daily. 90 tablet 3  . furosemide (LASIX) 20 MG tablet Take 1 tablet (20 mg total) by mouth 2 (two) times daily. 60 tablet 6  . HYDROcodone-acetaminophen (NORCO) 5-325 MG tablet Take 1 tablet by mouth every 6 (six) hours as needed for moderate pain. 40 tablet 0  . hydrocortisone 2.5 % cream Apply topically 2 (two) times daily. 30 g 0  . lisinopril (PRINIVIL,ZESTRIL) 40 MG tablet Take 1 tablet (40 mg total) by mouth daily. 90 tablet 0  . metformin (FORTAMET) 500 MG (OSM) 24 hr tablet Take 1 tablet (500 mg total) by mouth daily with breakfast. 90 tablet 1  . Multiple Vitamin (MULTIVITAMIN) capsule Take 1 capsule by mouth daily.      . nebivolol (BYSTOLIC) 10 MG tablet Take 3 tablets (30 mg total) by mouth daily. 90 tablet 9  . nitroGLYCERIN (NITROSTAT) 0.4 MG SL tablet Place 0.4 mg under the tongue every 5 (five) minutes as needed for chest pain (x 3 doses).    . ondansetron (ZOFRAN ODT) 4 MG disintegrating tablet Take 1 tablet (4 mg total) by mouth every 8 (eight) hours as needed for nausea or vomiting. 12 tablet 1  . Polyethyl Glycol-Propyl Glycol (SYSTANE) 0.4-0.3 % GEL Place 1 drop into both eyes daily as needed (dry eyes).     . Potassium 99 MG TABS Take 99 mg by mouth daily.     . pregabalin (LYRICA) 100 MG capsule Take 1 capsule (100 mg total) by mouth as needed (FIBROMYALGIA). 30 capsule 2  . ranitidine (ZANTAC) 300 MG tablet Take 1 tablet (300 mg total) by mouth at bedtime. 90 tablet 3   No current facility-administered medications for this visit.     Allergies as of 11/30/2015 - Review Complete 11/30/2015  Allergen Reaction Noted  . Bactrim [sulfamethoxazole-trimethoprim] Other (See Comments) 09/15/2013  . Cefuroxime axetil   02/07/2006  . Oxycodone Nausea And Vomiting 12/07/2010  . Pravastatin Other (See Comments) 04/02/2013  . Seldane [terfenadine]  02/07/2006  . Zocor [simvastatin]  01/23/2013  . Tramadol Other (See Comments) 10/05/2009    Family History  Problem Relation Age of Onset  . Heart disease Father   . Heart disease Mother   . Lung cancer Mother   . Allergies Sister   . Parkinsonism Sister     possible  . Asthma Sister   . Asthma Paternal Grandmother   . Stroke Paternal Grandmother   .  Heart disease      paternal grandparents, maternal grandparents,   . Heart disease Brother   . Emphysema Brother   . Aneurysm Brother     x3  . Kidney failure Brother   . Diabetes Brother   . Diabetes Brother   . Stroke Brother   . Stroke Maternal Grandmother   . Breast cancer Neg Hx   . Colon cancer Neg Hx   . Heart attack Neg Hx     Social History   Social History  . Marital status: Married    Spouse name: Ilona Sorrel  . Number of children: 2  . Years of education: masters   Occupational History  . Retired, disable since 2000 Retired   Social History Main Topics  . Smoking status: Former Smoker    Packs/day: 0.25    Years: 5.00    Types: Cigarettes    Quit date: 01/29/1998  . Smokeless tobacco: Never Used     Comment: Quit in 2001  . Alcohol use No  . Drug use: No  . Sexual activity: No   Other Topics Concern  . Not on file   Social History Narrative   On disability since 2000--- also husband has MS   Education. College.   Right handed.     Physical Exam: BP 140/80 (BP Location: Left Arm, Patient Position: Sitting, Cuff Size: Normal)   Pulse 76   Ht 5' 3.5" (1.613 m) Comment: height measured without shoes  Wt 197 lb 4 oz (89.5 kg)   BMI 34.39 kg/m  Constitutional: generally well-appearing Psychiatric: alert and oriented x3 Abdomen: soft, , nondistended, no obvious ascites, no peritoneal signs, normal bowel sounds; left upper quadrant laparoscopy scar site evaluated. No  obvious herniation. There was some tenderness when I palpated deep into the scar. Abdominal exam otherwise normal No peripheral edema noted in lower extremities  Assessment and plan: 75 y.o. female with personal history of T1 right-sided colon cancer, pain at left upper quadrant laparoscopy scar site  First she is due for surveillance colonoscopy. She takes Plavix for history of TIA and coronary artery disease. We will communicate with her cardiologist about the safety of her holding the Plavix for 5 days prior to colonoscopy. She has some pain in her left upper quadrant laparoscopy incision site. I couldn't detect a clear hernia but she is tender within the scar. Going to get her back in to see Dr. Marcello Moores see what she thinks of it. Possibly this is a small nerve entrapment, possibly small herniation which I cannot detect on my exam.   Owens Loffler, MD [facility] 11/30/2015, 9:12 AM

## 2015-12-01 NOTE — Telephone Encounter (Signed)
Will route clearance response, per Dr Meda Coffee, to Christian Mate RN at Dr Ardis Hughs.

## 2015-12-01 NOTE — Telephone Encounter (Signed)
5 days prior to the procedure,

## 2015-12-01 NOTE — Telephone Encounter (Signed)
Pt notified to stop plavix 5 days prior to the procedure, pt verbalized understanding

## 2015-12-04 DIAGNOSIS — H43812 Vitreous degeneration, left eye: Secondary | ICD-10-CM | POA: Diagnosis not present

## 2015-12-05 DIAGNOSIS — H43812 Vitreous degeneration, left eye: Secondary | ICD-10-CM | POA: Diagnosis not present

## 2015-12-05 DIAGNOSIS — E119 Type 2 diabetes mellitus without complications: Secondary | ICD-10-CM | POA: Diagnosis not present

## 2015-12-05 DIAGNOSIS — G43109 Migraine with aura, not intractable, without status migrainosus: Secondary | ICD-10-CM | POA: Diagnosis not present

## 2015-12-05 DIAGNOSIS — Z961 Presence of intraocular lens: Secondary | ICD-10-CM | POA: Diagnosis not present

## 2015-12-20 DIAGNOSIS — R1012 Left upper quadrant pain: Secondary | ICD-10-CM | POA: Diagnosis not present

## 2015-12-27 DIAGNOSIS — H04123 Dry eye syndrome of bilateral lacrimal glands: Secondary | ICD-10-CM | POA: Diagnosis not present

## 2015-12-27 DIAGNOSIS — H43812 Vitreous degeneration, left eye: Secondary | ICD-10-CM | POA: Diagnosis not present

## 2015-12-27 DIAGNOSIS — S0502XA Injury of conjunctiva and corneal abrasion without foreign body, left eye, initial encounter: Secondary | ICD-10-CM | POA: Diagnosis not present

## 2015-12-27 DIAGNOSIS — E119 Type 2 diabetes mellitus without complications: Secondary | ICD-10-CM | POA: Diagnosis not present

## 2015-12-28 ENCOUNTER — Other Ambulatory Visit: Payer: Self-pay | Admitting: General Surgery

## 2015-12-28 DIAGNOSIS — R1012 Left upper quadrant pain: Secondary | ICD-10-CM

## 2016-01-06 ENCOUNTER — Ambulatory Visit
Admission: RE | Admit: 2016-01-06 | Discharge: 2016-01-06 | Disposition: A | Discharge status: Home / Self Care | Payer: Medicare Other | Source: Ambulatory Visit | Attending: General Surgery | Admitting: General Surgery

## 2016-01-06 ENCOUNTER — Other Ambulatory Visit: Payer: Self-pay | Admitting: *Deleted

## 2016-01-06 ENCOUNTER — Other Ambulatory Visit: Payer: Self-pay | Admitting: Cardiology

## 2016-01-06 DIAGNOSIS — Z794 Long term (current) use of insulin: Secondary | ICD-10-CM

## 2016-01-06 DIAGNOSIS — E11311 Type 2 diabetes mellitus with unspecified diabetic retinopathy with macular edema: Secondary | ICD-10-CM

## 2016-01-06 DIAGNOSIS — E785 Hyperlipidemia, unspecified: Secondary | ICD-10-CM

## 2016-01-06 DIAGNOSIS — R1012 Left upper quadrant pain: Secondary | ICD-10-CM | POA: Diagnosis not present

## 2016-01-06 DIAGNOSIS — I10 Essential (primary) hypertension: Secondary | ICD-10-CM

## 2016-01-06 MED ORDER — CLOPIDOGREL BISULFATE 75 MG PO TABS: 75.0000 mg | ORAL_TABLET | Freq: Every day | ORAL | 1 refills | Status: DC

## 2016-01-06 MED ORDER — IOPAMIDOL (ISOVUE-300) INJECTION 61%: 100.0000 mL | Freq: Once | INTRAVENOUS | Status: DC | PRN

## 2016-01-06 MED ORDER — LISINOPRIL 40 MG PO TABS: 40.0000 mg | ORAL_TABLET | Freq: Every day | ORAL | 1 refills | Status: DC

## 2016-01-16 ENCOUNTER — Ambulatory Visit: Payer: Medicare Other | Admitting: Physical Therapy

## 2016-01-17 ENCOUNTER — Encounter: Payer: Self-pay | Admitting: Physical Therapy

## 2016-01-17 ENCOUNTER — Ambulatory Visit: Payer: Medicare Other | Attending: General Surgery | Admitting: Physical Therapy

## 2016-01-17 DIAGNOSIS — R222 Localized swelling, mass and lump, trunk: Secondary | ICD-10-CM | POA: Diagnosis not present

## 2016-01-17 DIAGNOSIS — M6281 Muscle weakness (generalized): Secondary | ICD-10-CM | POA: Diagnosis not present

## 2016-01-17 DIAGNOSIS — R262 Difficulty in walking, not elsewhere classified: Secondary | ICD-10-CM | POA: Diagnosis not present

## 2016-01-17 NOTE — Therapy (Signed)
[place], [place], [address] Phone: [phone]   Fax:  [phone]  Physical Therapy Evaluation  Patient Details  Name: Samantha [name] MRN: [id number] Date of Birth: May 13, 1940 Referring Provider: Dr. Marcello Moores  Encounter Date: 01/17/2016      PT End of Session - 01/17/16 1159    Visit Number 1   Number of Visits 9   Date for PT Re-Evaluation 02/14/16   PT Start Time 1116  pt arrived late   PT Stop Time 1151   PT Time Calculation (min) 35 min   Activity Tolerance Patient tolerated treatment well   Behavior During Therapy [facility] for tasks assessed/performed      Past Medical History:  Diagnosis Date  . Abnormal CT of the chest 2008   last CT4-l 2009:  . No f/u suggested   . Asthma   . CAD (coronary artery disease)    a. Coronary CTA 10/16: Coronary Ca score 211, mod non-obstructive CAD with LM mild plaque (25-50%), mid LAD 50-69%. b. Neg nuc 06/2015.  [name] Chronic diastolic CHF (congestive heart failure) ([place])   . Collagen vascular disease ([place])    "arterial sclerosis" per pt  . Complication of anesthesia    trouble waking up  . Fibromyalgia   . GERD (gastroesophageal reflux disease)   . H/O hiatal hernia   . Hyperlipidemia   . Hypertension   . Malignant neoplasm of ascending colon ([place]) 2016   Minimally invasive right hemicolectomy to be done   . Ocular migraine   . OSA (obstructive sleep apnea) 09/2007   dx w/ a sleep study, not on  CPAP  . Osteoarthritis   . Pneumonia    "double" in 2004  . PONV (postoperative nausea and vomiting)   . Reactive airway disease 01/29/2002   dx of pseudoasthma / vcd in 2005 and nl sprirometry History of dyspnea, 2011,  improved after several medications were changed around Question of COPD, disproved July 06, 2009 with nl pft's      . Rheumatoid factor positive   . Shingles 11/2009  . TIA (transient ischemic attack)    x2 - on Plavix for this  . Torn  rotator cuff    right worse than left, both are torn  . Tumor, 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  . Type II diabetes mellitus ([place])   . 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 Vaginal dysplasia     Past Surgical History:  Procedure Laterality Date  . ABDOMINAL HYSTERECTOMY  1980   NO oophorectomy per pt   . ANTERIOR CERVICAL DECOMP/DISCECTOMY FUSION  2001   C 3, C4 and C5 plate and screws  . BREAST BIOPSY Right 1999  . BUNIONECTOMY Left ~ 1977  . CATARACT EXTRACTION W/ INTRAOCULAR LENS  IMPLANT, BILATERAL  2012  . COLON SURGERY  10/2014  . EYE SURGERY Bilateral    torq lens for cataracts  . THYROIDECTOMY, PARTIAL  1960's  . VAGINAL MASS EXCISION  1994   "Laser surgery for vaginal cancer; followed by chemotherapy" (06/27/2012)    There were no vitals filed for this visit.       Subjective Assessment - 01/17/16 1122    Subjective I had a colon resection in October of 2016. Since then I have had pain in my abdomen to the point it hurts to push my stomach in slightly. I feel like that area is swollen. If I am sweeping or bending over it  really hurts. Any sort of bending forward hurts.    Pertinent History type 2 diabetes, vaginal cancer, colon cancer, rheumatoid factor positive, TIA, fibromyalgia, congestive heart failure, pt reports R knee needs surgery   Patient Stated Goals to get rid of the soreness in my abdomen so I can function better   Currently in Pain? Yes   Pain Score 4    Pain Location Abdomen   Pain Orientation Left   Pain Descriptors / Indicators Sharp   Pain Type Chronic pain;Surgical pain   Pain Onset More than a month ago   Pain Frequency Constant   Aggravating Factors  bending forward    Pain Relieving Factors rest   Effect of Pain on Daily Activities unable to bend forward - lots of pain when sweeping or bending forward            [facility] PT Assessment - 01/17/16 0001      Assessment   Medical Diagnosis colon cancer   Referring Provider Dr. Marcello Moores    Onset Date/Surgical Date 11/11/14   Hand Dominance Right   Prior Therapy PT for TIA in '14     Precautions   Precautions Other (comment)  at risk of lymphedema     Restrictions   Weight Bearing Restrictions No     Balance Screen   Has the patient fallen in the past 6 months No   Has the patient had a decrease in activity level because of a fear of falling?  No   Is the patient reluctant to leave their home because of a fear of falling?  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- she has to assist with bed mobility, daughter helps with manual [employer] fast streamer   Leisure pt completes back exercises- 3x/wk sometimes more     Cognition   Overall Cognitive Status Within Functional Limits for tasks assessed     AROM   Overall AROM  Within functional limits for tasks performed  for bilateral LEs   Right Hip Flexion --   Left Hip Flexion --   Right Knee Extension --   Left Knee Extension --     Strength   Right Hip Flexion 4/5   Right Hip ABduction 4/5   Left Hip Flexion 4/5   Left Hip ABduction 4/5   Right Knee Extension 3+/5   Left Knee Extension 4/5                                   Long Term Clinic Goals - 01/17/16 1211      CC Long Term Goal  #1   Title Pt will report a 50% improvement in abdominal pain when bending forward to allow her complete ADLs   Time 4   Period Weeks   Status New     CC Long Term Goal  #2   Title Pt will demonstrate R quad strength of 4/5  to decrease  risk of falls    Baseline 3+/5   Time 4   Period Weeks   Status New     CC Long Term Goal  #3   Title Pt will be independent in nerve desensitization technique for long term management of pain   Time 4   Period Weeks   Status New     CC Long Term Goal  #4   Title Pt will be independent in a home exercise program for continued strengthening   Time 4   Period Weeks   Status New            Plan - 01/17/16 1201    Clinical Impression Statement Pt had stage 1 colon cancer and underwent a surgical resection in October of 2016. Since that time she has had sharp pains in her abdomen in the area superior of her scar. She has difficulty with any activity that requires bending forward to the pain. 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 Pt reports abdominal fullness that was not there prior to surgery. There is no fibrosis palpable at scar line but she has extreme tenderness superior to scar line. Was unable to palpate deeper to feel for scar tissue secondary to pain. She may benefit from nerve densensitization techniques. She has demonstrates some weakness of her RLE and she reports she needs surgery on her right knee. The evaluation is of moderate complexity due to her numerous comorbidities that could affect treatment including diabetes, fibromyalgia, and congestive heart failure. Her condition is evolving.    Rehab Potential Good   Clinical Impairments Affecting Rehab Potential numerous comorbidities   PT Frequency 2x / week   PT Duration 4 weeks   PT Treatment/Interventions ADLs/Self Care Home Management;Therapeutic exercise;Patient/family education;Manual techniques;Manual lymph drainage;Scar mobilization   PT Next Visit Plan NuStep prior to abdominal MLD, nerve desensitization   Consulted and Agree with Plan of Care Patient      Patient will benefit from skilled therapeutic intervention in order to improve  the following deficits and impairments:  Pain, Decreased strength, Increased edema, Difficulty walking  Visit Diagnosis: Localized swelling, mass and lump, trunk - Plan: PT plan of care cert/re-cert  Muscle weakness (generalized) - Plan: PT plan of care cert/re-cert  Difficulty in walking, not elsewhere classified - Plan: PT plan of care cert/re-cert     Problem List Patient Active Problem List   Diagnosis Date Noted  . CAD in native artery 08/22/2015  . Hypertensive heart disease 07/07/2015  . Essential hypertension 07/07/2015  . SOB (shortness of breath) 07/07/2015  . Dizziness 07/07/2015  . Plantar fasciitis, left 03/22/2015  . Hx of colon cancer, stage I 01/11/2015  . Coronary artery disease due to lipid rich plaque 01/05/2015  . Chronic diastolic CHF (congestive heart failure), NYHA class 2 ([place]) 01/05/2015  . 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 Migraine (Ocular) 01/05/2014  . Gait difficulty 10/06/2013  . Pain in joint, shoulder region 11/27/2012  . VBI (vertebrobasilar insufficiency) 08/22/2012  . Right knee pain 07/29/2012  . TIA (transient ischemic attack) 06/27/2012  . Edema 02/01/2012  . DJD (degenerative joint disease) 02/02/2011  . 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 Postherpetic neuralgia ? 01/02/2010  . NECK PAIN 10/05/2009  . Palpitations 11/23/2008  . UTI'S, RECURRENT 09/28/2008  . Fibromyalgia 08/15/2007  . FATIGUE 11/19/2006  . DM  II (diabetes mellitus, type II), w/ neuropathy 05/21/2006  . Hyperlipidemia 05/21/2006  . Reactive airway disease 01/29/2002    Alexia Freestone 01/17/2016, 12:15 PM  [place], [place], [address] Phone: [phone]   Fax:  [phone]  Name: Samantha [name] MRN: [id number] Date of Birth: 25-Oct-1940  Allyson Sabal, PT 01/17/16 12:15 PM

## 2016-01-18 ENCOUNTER — Ambulatory Visit: Payer: Medicare Other

## 2016-01-18 DIAGNOSIS — M6281 Muscle weakness (generalized): Secondary | ICD-10-CM | POA: Diagnosis not present

## 2016-01-18 DIAGNOSIS — R222 Localized swelling, mass and lump, trunk: Secondary | ICD-10-CM

## 2016-01-18 DIAGNOSIS — R262 Difficulty in walking, not elsewhere classified: Secondary | ICD-10-CM

## 2016-01-18 NOTE — Therapy (Signed)
[place], [place], [address] Phone: [phone]   Fax:  [phone]  Physical Therapy Treatment  Patient Details  Name: Samantha [name] MRN: [id number] Date of Birth: [birth year] Referring Provider: Dr. Marcello Moores  Encounter Date: 01/18/2016      PT End of Session - 01/18/16 1210    Visit Number 2   Number of Visits 9   Date for PT Re-Evaluation 02/14/16   PT Start Time 1104   PT Stop Time 1152   PT Time Calculation (min) 48 min   Activity Tolerance Patient tolerated treatment well   Behavior During Therapy [facility] for tasks assessed/performed      Past Medical History:  Diagnosis Date  . Abnormal CT of the chest 2008   last CT4-l 2009:  . No f/u suggested   . Asthma   . CAD (coronary artery disease)    a. Coronary CTA 10/16: Coronary Ca score 211, mod non-obstructive CAD with LM mild plaque (25-50%), mid LAD 50-69%. b. Neg nuc 06/2015.  [name] Chronic diastolic CHF (congestive heart failure) ([place])   . Collagen vascular disease ([place])    "arterial sclerosis" per pt  . Complication of anesthesia    trouble waking up  . Fibromyalgia   . GERD (gastroesophageal reflux disease)   . H/O hiatal hernia   . Hyperlipidemia   . Hypertension   . Malignant neoplasm of ascending colon ([place]) 2016   Minimally invasive right hemicolectomy to be done   . Ocular migraine   . OSA (obstructive sleep apnea) 09/2007   dx w/ a sleep study, not on  CPAP  . Osteoarthritis   . Pneumonia    "double" in 2004  . PONV (postoperative nausea and vomiting)   . Reactive airway disease 01/29/2002   dx of pseudoasthma / vcd in 2005 and nl sprirometry History of dyspnea, 2011,  improved after several medications were changed around Question of COPD, disproved July 06, 2009 with nl pft's      . Rheumatoid factor positive   . Shingles 11/2009  . TIA (transient ischemic attack)    x2 - on Plavix for this  . Torn rotator cuff    right  worse than left, both are torn  . Tumor, 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  . Type II diabetes mellitus ([place])   . 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 Vaginal dysplasia     Past Surgical History:  Procedure Laterality Date  . ABDOMINAL HYSTERECTOMY  1980   NO oophorectomy per pt   . ANTERIOR CERVICAL DECOMP/DISCECTOMY FUSION  2001   C 3, C4 and C5 plate and screws  . BREAST BIOPSY Right 1999  . BUNIONECTOMY Left ~ 1977  . CATARACT EXTRACTION W/ INTRAOCULAR LENS  IMPLANT, BILATERAL  2012  . COLON SURGERY  10/2014  . EYE SURGERY Bilateral    torq lens for cataracts  . THYROIDECTOMY, PARTIAL  1960's  . VAGINAL MASS EXCISION  1994   "Laser surgery for vaginal cancer; followed by chemotherapy" (06/27/2012)    There were no vitals filed for this visit.      Subjective Assessment - 01/18/16 1113    Subjective Not much change today from yesterday. The rainy weather makes my Rt knee feel sore.    Pertinent History type 2 diabetes, vaginal cancer, colon cancer, rheumatoid factor positive, TIA, fibromyalgia, congestive heart failure, pt reports R knee needs surgery   Patient Stated Goals to get rid  of the soreness in my abdomen so I can function better   Currently in Pain? Yes   Pain Score 6   This morning, but not hurting now   Pain Location Abdomen   Pain Orientation Left   Pain Descriptors / Indicators Tender   Pain Type Chronic pain;Surgical pain   Pain Onset More than a month ago   Pain Frequency Intermittent   Aggravating Factors  bending forward   Pain Relieving Factors rest                         [facility] Adult PT Treatment/Exercise - 01/18/16 0001      Lumbar Exercises: Stretches   Lower Trunk Rotation 2 reps;20 seconds   Lower Trunk Rotation Limitations To Rt side with manual pressure for increased stretch by therapist to Lt leg and Lt shoulder and then performed gentle myofascial release to incision as well briefly     Knee/Hip Exercises: Aerobic    Nustep Level 4 x 7 minutes     Manual Therapy   Myofascial Release To anterior trunk horizontally and vertically with light pressure and to patients tolerance at surrounding area to incision and on incision, did this periodically throughout manual lymph drainage as pt had limited tolerance to this and then had pt return demonstration of this which she did well with correct amount of pressure.   Manual Lymphatic Drainage (MLD) In Supine: Short neck, superficial and deep abdominals (gently), Lt axilla nodes and Lt inguino-axillary anastomosis, and then focused on anterior Lt trunk below and above transverse watershed directing towards anastomosis.                PT Education - 01/18/16 1214    Education provided Yes   Education Details Basic anatomy of lymphatic system and myofascial release to and around abdominal incision.   Person(s) Educated Patient   Methods Explanation;Demonstration   Comprehension Verbalized understanding;Returned demonstration                [facility] Goals - 01/17/16 1211      CC Long Term Goal  #1   Title Pt will report a 50% improvement in abdominal pain when bending forward to allow her complete ADLs   Time 4   Period Weeks   Status New     CC Long Term Goal  #2   Title Pt will demonstrate R quad strength of 4/5 to decrease risk of falls    Baseline 3+/5   Time 4   Period Weeks   Status New     CC Long Term Goal  #3   Title Pt will be independent in nerve desensitization technique for long term management of pain   Time 4   Period Weeks   Status New     CC Long Term Goal  #4   Title Pt will be independent in a home exercise program for continued strengthening   Time 4   Period Weeks   Status New            Plan - 01/18/16 1210    Clinical Impression Statement Pt tolerated first session very well today. She liked the NuStep and reported this felt good to her Rt knee and she was able to tolerate manual therapy well  today without c/o increased pain at incision or surrounding area. Was able to palpate area fairly well today and pts scar tissue appears fairly mobile though she does have fullness  in abdomen and inferior to Lt breast feels almost fibrotic. Pt reported feeling better after session today.   Rehab Potential Good   Clinical Impairments Affecting Rehab Potential numerous comorbidities   PT Frequency 2x / week   PT Duration 4 weeks   PT Treatment/Interventions ADLs/Self Care Home Management;Therapeutic exercise;Patient/family education;Manual techniques;Manual lymph drainage;Scar mobilization   PT Next Visit Plan Cont NuStep prior to abdominal MLD, nerve desensitization and cont to instruct pt in this having her perform MLD if able.   Consulted and Agree with Plan of Care Patient      Patient will benefit from skilled therapeutic intervention in order to improve the following deficits and impairments:  Pain, Decreased strength, Increased edema, Difficulty walking  Visit Diagnosis: Localized swelling, mass and lump, trunk  Muscle weakness (generalized)  Difficulty in walking, not elsewhere classified     Problem List Patient Active Problem List   Diagnosis Date Noted  . CAD in native artery 08/22/2015  . Hypertensive heart disease 07/07/2015  . Essential hypertension 07/07/2015  . SOB (shortness of breath) 07/07/2015  . Dizziness 07/07/2015  . Plantar fasciitis, left 03/22/2015  . Hx of colon cancer, stage I 01/11/2015  . Coronary artery disease due to lipid rich plaque 01/05/2015  . Chronic diastolic CHF (congestive heart failure), NYHA class 2 ([place]) 01/05/2015  . 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 Migraine (Ocular) 01/05/2014  . Gait difficulty 10/06/2013  . Pain in joint, shoulder region 11/27/2012  . VBI (vertebrobasilar insufficiency) 08/22/2012  . Right knee pain 07/29/2012  . TIA (transient ischemic attack)  06/27/2012  . Edema 02/01/2012  . DJD (degenerative joint disease) 02/02/2011  . 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 Postherpetic neuralgia ? 01/02/2010  . NECK PAIN 10/05/2009  . Palpitations 11/23/2008  . UTI'S, RECURRENT 09/28/2008  . Fibromyalgia 08/15/2007  . FATIGUE 11/19/2006  . 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 Hyperlipidemia 05/21/2006  . Reactive airway disease 01/29/2002    Otelia Limes, PTA 01/18/2016, 12:16 PM  [place], [place], [address] Phone: [phone]   Fax:  [phone]  Name: LANEKA DOEPKE MRN: [id number] Date of Birth: 1940/10/31

## 2016-01-20 ENCOUNTER — Encounter (HOSPITAL_COMMUNITY): Payer: Self-pay | Admitting: *Deleted

## 2016-01-20 ENCOUNTER — Ambulatory Visit (INDEPENDENT_AMBULATORY_CARE_PROVIDER_SITE_OTHER): Payer: Medicare Other

## 2016-01-20 ENCOUNTER — Ambulatory Visit (HOSPITAL_COMMUNITY)
Admission: EM | Admit: 2016-01-20 | Discharge: 2016-01-20 | Disposition: A | Discharge status: Home / Self Care | Payer: Medicare Other | Attending: Family Medicine | Admitting: Family Medicine

## 2016-01-20 DIAGNOSIS — S32010A Wedge compression fracture of first lumbar vertebra, initial encounter for closed fracture: Secondary | ICD-10-CM | POA: Diagnosis not present

## 2016-01-20 DIAGNOSIS — M4186 Other forms of scoliosis, lumbar region: Secondary | ICD-10-CM

## 2016-01-20 DIAGNOSIS — M545 Low back pain: Secondary | ICD-10-CM | POA: Diagnosis not present

## 2016-01-20 MED ORDER — CALCITONIN (SALMON) 200 UNIT/ACT NA SOLN: 1.0000 | Freq: Every day | NASAL | 12 refills | Status: DC

## 2016-01-20 MED ORDER — HYDROCODONE-ACETAMINOPHEN 5-325 MG PO TABS: 1.0000 | ORAL_TABLET | ORAL | 0 refills | Status: DC | PRN

## 2016-01-20 NOTE — ED Provider Notes (Signed)
[name]    CSN: [id number] Arrival date & time: 01/20/16  1733     History   Chief Complaint Chief Complaint  Patient presents with  . Back Pain    HPI Samantha [name] is a 75 y.o. female.   This is a 75 year old woman who comes in complaining of back pain for the last week. Made worse by moving in any direction, but especially when sitting up after lying down. She's had no loss of feeling in her legs, loss of power in her legs, and no difficulty with urination. She denies fever.  Patient states that her husband was admitted to the hospital one week ago. She may been lifting more during the trips back and forth to the hospital. The pain is gradually getting worse and when she does sit up and flex her torso, she has excruciating pain.  Patient has no history of osteoporosis.      Past Medical History:  Diagnosis Date  . Abnormal CT of the chest 2008   last CT4-l 2009:  . No f/u suggested   . Asthma   . CAD (coronary artery disease)    a. Coronary CTA 10/16: Coronary Ca score 211, mod non-obstructive CAD with LM mild plaque (25-50%), mid LAD 50-69%. b. Neg nuc 06/2015.  [name] Chronic diastolic CHF (congestive heart failure) ([place])   . Collagen vascular disease ([place])    "arterial sclerosis" per pt  . Complication of anesthesia    trouble waking up  . Fibromyalgia   . GERD (gastroesophageal reflux disease)   . H/O hiatal hernia   . Hyperlipidemia   . Hypertension   . Malignant neoplasm of ascending colon ([place]) 2016   Minimally invasive right hemicolectomy to be done   . Ocular migraine   . OSA (obstructive sleep apnea) 09/2007   dx w/ a sleep study, not on  CPAP  . Osteoarthritis   . Pneumonia    "double" in 2004  . PONV (postoperative nausea and vomiting)   . Reactive airway disease 01/29/2002   dx of pseudoasthma / vcd in 2005 and nl sprirometry History of dyspnea, 2011,  improved after several medications were changed around Question of COPD, disproved  July 06, 2009 with nl pft's      . Rheumatoid factor positive   . Shingles 11/2009  . TIA (transient ischemic attack)    x2 - on Plavix for this  . Torn rotator cuff    right worse than left, both are torn  . Tumor, 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  . Type II diabetes mellitus ([place])   . 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 Vaginal dysplasia     Patient Active Problem List   Diagnosis Date Noted  . CAD in native artery 08/22/2015  . Hypertensive heart disease 07/07/2015  . Essential hypertension 07/07/2015  . SOB (shortness of breath) 07/07/2015  . Dizziness 07/07/2015  . Plantar fasciitis, left 03/22/2015  . Hx of colon cancer, stage I 01/11/2015  . Coronary artery disease due to lipid rich plaque 01/05/2015  . Chronic diastolic CHF (congestive heart failure), NYHA class 2 ([place]) 01/05/2015  . 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 Migraine (Ocular) 01/05/2014  . Gait difficulty 10/06/2013  . Pain in joint, shoulder region 11/27/2012  . VBI (vertebrobasilar insufficiency) 08/22/2012  . Right knee pain 07/29/2012  . TIA (transient ischemic attack) 06/27/2012  . Edema 02/01/2012  .  DJD (degenerative joint disease) 02/02/2011  . 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 Postherpetic neuralgia ? 01/02/2010  . NECK PAIN 10/05/2009  . Palpitations 11/23/2008  . UTI'S, RECURRENT 09/28/2008  . Fibromyalgia 08/15/2007  . FATIGUE 11/19/2006  . 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 Hyperlipidemia 05/21/2006  . Reactive airway disease 01/29/2002    Past Surgical History:  Procedure Laterality Date  . ABDOMINAL HYSTERECTOMY  1980   NO oophorectomy per pt   . ANTERIOR CERVICAL DECOMP/DISCECTOMY FUSION  2001   C 3, C4 and C5 plate and screws  . BREAST BIOPSY Right 1999  . BUNIONECTOMY Left ~ 1977  . CATARACT EXTRACTION W/ INTRAOCULAR LENS  IMPLANT, BILATERAL  2012  . COLON SURGERY  10/2014  .  EYE SURGERY Bilateral    torq lens for cataracts  . THYROIDECTOMY, PARTIAL  1960's  . VAGINAL MASS EXCISION  1994   "Laser surgery for vaginal cancer; followed by chemotherapy" (06/27/2012)    OB History    Gravida Para Term Preterm AB Living   2             SAB TAB Ectopic Multiple Live Births                   Home Medications    Prior to Admission medications   Medication Sig Start Date End Date Taking? Authorizing Provider  acetaminophen (TYLENOL) 500 MG tablet Take 1,000 mg by mouth every 6 (six) hours as needed for moderate pain.    Historical Provider, MD  albuterol (PROVENTIL HFA;VENTOLIN HFA) 108 (90 Base) MCG/ACT inhaler Inhale 2 puffs into the lungs every 6 (six) hours as needed for wheezing or shortness of breath. 03/17/15   Colon Branch, MD  atorvastatin (LIPITOR) 20 MG tablet TAKE 1 TABLET (20 MG TOTAL) BY MOUTH DAILY. 01/06/16   [name], MD  butalbital-acetaminophen-caffeine (FIORICET) [phone] MG tablet Take 1 tablet by mouth every 6 (six) hours as needed for headache or migraine. 01/11/15   Marcial Pacas, MD  calcitonin, salmon, (MIACALCIN) 200 UNIT/ACT nasal spray Place 1 spray into alternate nostrils daily. 01/20/16   Robyn Haber, MD  CALCIUM PO Take 1 tablet by mouth every morning.     Historical Provider, MD  cetirizine (ZYRTEC) 10 MG tablet Take 1 tablet (10 mg total) by mouth daily. 05/12/14   Colon Branch, MD  cholecalciferol (VITAMIN D) 1000 UNITS tablet Take 1,000 Units by mouth every morning.     Historical Provider, MD  cholestyramine (QUESTRAN) 4 g packet Take 4 g by mouth every other day.    Historical Provider, MD  clopidogrel (PLAVIX) 75 MG tablet Take 1 tablet (75 mg total) by mouth daily. 01/06/16   [name], MD  HYDROcodone-acetaminophen (NORCO) 5-325 MG tablet Take 1 tablet by mouth every 4 (four) hours as needed for moderate pain. 01/20/16   Robyn Haber, MD  hydrocortisone 2.5 % cream Apply topically 2 (two) times daily. 03/17/15   Colon Branch, MD  lisinopril (PRINIVIL,ZESTRIL) 40 MG tablet Take 1 tablet (40 mg total) by mouth daily. 01/06/16   [name], MD  metformin (FORTAMET) 500 MG (OSM) 24 hr tablet Take 1 tablet (500 mg total) by mouth daily with breakfast. 10/19/15   [name], MD  Multiple Vitamin (MULTIVITAMIN) capsule Take 1 capsule by mouth daily.      Historical Provider, MD  nebivolol (BYSTOLIC) 10 MG tablet Take 3 tablets (30 mg total)  by mouth daily. 11/29/15   Liliane Shi, PA-C  nitroGLYCERIN (NITROSTAT) 0.4 MG SL tablet Place 0.4 mg under the tongue every 5 (five) minutes as needed for chest pain (x 3 doses).    Historical Provider, MD  ondansetron (ZOFRAN ODT) 4 MG disintegrating tablet Take 1 tablet (4 mg total) by mouth every 8 (eight) hours as needed for nausea or vomiting. 12/03/13   Fredia Sorrow, MD  Polyethyl Glycol-Propyl Glycol (SYSTANE) 0.4-0.3 % GEL Place 1 drop into both eyes daily as needed (dry eyes).     Historical Provider, MD  Potassium 99 MG TABS Take 99 mg by mouth daily.     Historical Provider, MD  pregabalin (LYRICA) 100 MG capsule Take 1 capsule (100 mg total) by mouth as needed (FIBROMYALGIA). 11/21/15   [name], MD  ranitidine (ZANTAC) 300 MG tablet Take 1 tablet (300 mg total) by mouth at bedtime. 10/19/15   [name], MD    Family History Family History  Problem Relation Age of Onset  . Heart disease Father   . Heart disease Mother   . Lung cancer Mother   . Allergies Sister   . Parkinsonism Sister     possible  . Asthma Sister   . Asthma Paternal Grandmother   . Stroke Paternal Grandmother   . Heart disease      paternal grandparents, maternal grandparents,   . Heart disease Brother   . Emphysema Brother   . Aneurysm Brother     x3  . Kidney failure Brother   . Diabetes Brother   . Diabetes Brother   . Stroke Brother   . Stroke Maternal Grandmother   . Breast cancer Neg Hx   . Colon cancer Neg Hx   . Heart attack Neg Hx     Social History Social  History  Substance Use Topics  . Smoking status: Former Smoker    Packs/day: 0.25    Years: 5.00    Types: Cigarettes    Quit date: 01/29/1998  . Smokeless tobacco: Never Used     Comment: Quit in 2001  . Alcohol use No     Allergies   Bactrim [sulfamethoxazole-trimethoprim]; Cefuroxime axetil; Oxycodone; Pravastatin; Seldane [terfenadine]; Zocor [simvastatin]; and Tramadol   Review of Systems Review of Systems  Constitutional: Negative.   HENT: Negative.   Respiratory: Negative.   Cardiovascular: Negative.   Gastrointestinal: Negative.   Genitourinary: Negative.      Physical Exam Triage Vital Signs ED Triage Vitals  Enc Vitals Group     BP 01/20/16 1757 175/86     Pulse Rate 01/20/16 1757 78     Resp 01/20/16 1757 18     Temp 01/20/16 1757 98.6 F (37 C)     Temp Source 01/20/16 1757 Oral     SpO2 01/20/16 1757 100 %     Weight --      Height --      Head Circumference --      Peak Flow --      Pain Score 01/20/16 1758 2     Pain Loc --      Pain Edu? --      Excl. in [place]? --    No data found.   Updated Vital Signs BP 175/86 (BP Location: Left Arm)   Pulse 78   Temp 98.6 F (37 C) (Oral)   Resp 18   SpO2 100%   Physical Exam  Constitutional: She is oriented to person, place, and  time. She appears well-developed and well-nourished.  HENT:  Head: Normocephalic.  Right Ear: External ear normal.  Left Ear: External ear normal.  Mouth/Throat: Oropharynx is clear and moist.  Eyes: Conjunctivae are normal.  Neck: Normal range of motion. Neck supple.  Pulmonary/Chest: Effort normal.  Musculoskeletal: Normal range of motion.  Neurological: She is alert and oriented to person, place, and time.  Skin: Skin is warm and dry.  Nursing note and vitals reviewed.    UC Treatments / Results  Labs (all labs ordered are listed, but only abnormal results are displayed) Labs Reviewed - No data to display  EKG  EKG Interpretation None        Radiology Moderate scoliosis and lumbar spine along with spondylosis and slight compression of L2. Procedures Procedures (including critical care time)  Medications Ordered in UC Medications - No data to display   Initial Impression / Assessment and Plan / UC Course  I have reviewed the triage vital signs and the nursing notes.  Pertinent labs & imaging results that were available during my care of the patient were reviewed by me and considered in my medical decision making (see chart for details).  Clinical Course      Final Clinical Impressions(s) / UC Diagnoses   Final diagnoses:  Other form of scoliosis of lumbar spine  Closed compression fracture of first lumbar vertebra, initial encounter ([place])    New Prescriptions New Prescriptions   CALCITONIN, SALMON, (MIACALCIN) 200 UNIT/ACT NASAL SPRAY    Place 1 spray into alternate nostrils daily.     Robyn Haber, MD 01/20/16 [phone]

## 2016-01-20 NOTE — ED Triage Notes (Signed)
Pt  Reports   Back pain  With  Pain  Radiating  Around  Both   Sides      X  1  Week    Pt  denys  Any  specefic  Injury  However    Is  Worse  On movement  And   Certain posistions        Pt  Sitting upright on the  Exam table  Speaking in complete sentances

## 2016-01-24 ENCOUNTER — Ambulatory Visit: Payer: Medicare Other | Admitting: Family

## 2016-01-26 ENCOUNTER — Ambulatory Visit: Payer: Medicare Other | Admitting: Physical Therapy

## 2016-01-31 ENCOUNTER — Telehealth: Payer: Self-pay | Admitting: Gastroenterology

## 2016-01-31 ENCOUNTER — Ambulatory Visit: Payer: Medicare Other | Attending: General Surgery

## 2016-01-31 DIAGNOSIS — R222 Localized swelling, mass and lump, trunk: Secondary | ICD-10-CM | POA: Insufficient documentation

## 2016-01-31 DIAGNOSIS — R262 Difficulty in walking, not elsewhere classified: Secondary | ICD-10-CM | POA: Insufficient documentation

## 2016-01-31 DIAGNOSIS — M6281 Muscle weakness (generalized): Secondary | ICD-10-CM | POA: Diagnosis not present

## 2016-01-31 NOTE — Therapy (Signed)
[place], [place], [address] Phone: [phone]   Fax:  [phone]  Physical Therapy Treatment  Patient Details  Name: Samantha [name] MRN: [id number] Date of Birth: October 10, 1940 Referring Provider: Dr. Marcello Moores  Encounter Date: 01/31/2016      PT End of Session - 01/31/16 1122    Visit Number 3   Number of Visits 9   Date for PT Re-Evaluation 02/14/16   PT Start Time 1036   PT Stop Time 1118   PT Time Calculation (min) 42 min   Activity Tolerance Patient tolerated treatment well   Behavior During Therapy [facility] for tasks assessed/performed      Past Medical History:  Diagnosis Date  . Abnormal CT of the chest 2008   last CT4-l 2009:  . No f/u suggested   . Asthma   . CAD (coronary artery disease)    a. Coronary CTA 10/16: Coronary Ca score 211, mod non-obstructive CAD with LM mild plaque (25-50%), mid LAD 50-69%. b. Neg nuc 06/2015.  [name] Chronic diastolic CHF (congestive heart failure) ([place])   . Collagen vascular disease ([place])    "arterial sclerosis" per pt  . Complication of anesthesia    trouble waking up  . Fibromyalgia   . GERD (gastroesophageal reflux disease)   . H/O hiatal hernia   . Hyperlipidemia   . Hypertension   . Malignant neoplasm of ascending colon ([place]) 2016   Minimally invasive right hemicolectomy to be done   . Ocular migraine   . OSA (obstructive sleep apnea) 09/2007   dx w/ a sleep study, not on  CPAP  . Osteoarthritis   . Pneumonia    "double" in 2004  . PONV (postoperative nausea and vomiting)   . Reactive airway disease 01/29/2002   dx of pseudoasthma / vcd in 2005 and nl sprirometry History of dyspnea, 2011,  improved after several medications were changed around Question of COPD, disproved July 06, 2009 with nl pft's      . Rheumatoid factor positive   . Shingles 11/2009  . TIA (transient ischemic attack)    x2 - on Plavix for this  . Torn rotator cuff    right  worse than left, both are torn  . Tumor, 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  . Type II diabetes mellitus ([place])   . 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 Vaginal dysplasia     Past Surgical History:  Procedure Laterality Date  . ABDOMINAL HYSTERECTOMY  1980   NO oophorectomy per pt   . ANTERIOR CERVICAL DECOMP/DISCECTOMY FUSION  2001   C 3, C4 and C5 plate and screws  . BREAST BIOPSY Right 1999  . BUNIONECTOMY Left ~ 1977  . CATARACT EXTRACTION W/ INTRAOCULAR LENS  IMPLANT, BILATERAL  2012  . COLON SURGERY  10/2014  . EYE SURGERY Bilateral    torq lens for cataracts  . THYROIDECTOMY, PARTIAL  1960's  . VAGINAL MASS EXCISION  1994   "Laser surgery for vaginal cancer; followed by chemotherapy" (06/27/2012)    There were no vitals filed for this visit.      Subjective Assessment - 01/31/16 1047    Subjective After last visit I went to Urgent Care due to back pain I had been having for about 2 weeks and they diagnosed me with a L1 compression fracture and scoliosis! I have an appt with my primary doctor (Dr. Quay Burow) Thursday for follow up. They put me back  on hydrocodone and a nasal spray  that is supposed to help heal the fracture. So I'm not going to do the NuStep on anything that will hurt my back but I can lay there for the hands on treatment.  My abdomen actually doesn't hurt right now but I am medicated. I felt ok after last session, some pain but tolerable.    Pertinent History type 2 diabetes, vaginal cancer, colon cancer, rheumatoid factor positive, TIA, fibromyalgia, congestive heart failure, pt reports R knee needs surgery   Patient Stated Goals to get rid of the soreness in my abdomen so I can function better   Currently in Pain? Yes   Pain Score 4    Pain Location Back   Pain Orientation Lower   Pain Descriptors / Indicators Dull   Pain Type Acute pain   Pain Onset 1 to 4 weeks ago   Pain Frequency Intermittent   Aggravating Factors  my compression fracture at L1 just  hurts off and on   Pain Relieving Factors pain meds                         [facility] Adult PT Treatment/Exercise - 01/31/16 0001      Manual Therapy   Myofascial Release To anterior trunk horizontally and vertically with light pressure and to patients tolerance at surrounding area to incision and on incision, did this periodically throughout manual lymph drainage, pt tolerated this better today as she took a pain med before session due to L1 fracture.    Manual Lymphatic Drainage (MLD) In Supine: Short neck, superficial and deep abdominals (gently), Lt axilla nodes and Lt inguino-axillary anastomosis, and then focused on anterior Lt trunk below and above transverse watershed directing towards anastomosis.                        [facility] Goals - 01/17/16 1211      CC Long Term Goal  #1   Title Pt will report a 50% improvement in abdominal pain when bending forward to allow her complete ADLs   Time 4   Period Weeks   Status New     CC Long Term Goal  #2   Title Pt will demonstrate R quad strength of 4/5 to decrease risk of falls    Baseline 3+/5   Time 4   Period Weeks   Status New     CC Long Term Goal  #3   Title Pt will be independent in nerve desensitization technique for long term management of pain   Time 4   Period Weeks   Status New     CC Long Term Goal  #4   Title Pt will be independent in a home exercise program for continued strengthening   Time 4   Period Weeks   Status New            Plan - 01/31/16 1123    Clinical Impression Statement Spoke with Maudry Diego, PT and she agreed okay to treat pt but only do manual therapy today, not exercises done last visit. So held off on Nustep and any low back stretches today as done on last visit (for increased abdominal stretch) due to pt being diagnosed since last visit with an L1 compression fracture. She has a follow up appt with her primary doctors PA Thursday. She tolerated  manual therapy well (in hooklying with bolster under knees), reporting some tenderness  at incision area but better than last visit.    Rehab Potential Good   Clinical Impairments Affecting Rehab Potential numerous comorbidities; healing L1 compression fracture    PT Frequency 2x / week   PT Duration 4 weeks   PT Treatment/Interventions ADLs/Self Care Home Management;Therapeutic exercise;Patient/family education;Manual techniques;Manual lymph drainage;Scar mobilization   PT Next Visit Plan See ow her appt with her doctor went regarding her compression frature. Possibly add ab set exercise? Cont nerve desensitization and cont to instruct pt in this having her perform MLD if able.   Consulted and Agree with Plan of Care Patient      Patient will benefit from skilled therapeutic intervention in order to improve the following deficits and impairments:  Pain, Decreased strength, Increased edema, Difficulty walking  Visit Diagnosis: Localized swelling, mass and lump, trunk  Muscle weakness (generalized)  Difficulty in walking, not elsewhere classified     Problem List Patient Active Problem List   Diagnosis Date Noted  . CAD in native artery 08/22/2015  . Hypertensive heart disease 07/07/2015  . Essential hypertension 07/07/2015  . SOB (shortness of breath) 07/07/2015  . Dizziness 07/07/2015  . Plantar fasciitis, left 03/22/2015  . Hx of colon cancer, stage I 01/11/2015  . Coronary artery disease due to lipid rich plaque 01/05/2015  . Chronic diastolic CHF (congestive heart failure), NYHA class 2 ([place]) 01/05/2015  . 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 Migraine (Ocular) 01/05/2014  . Gait difficulty 10/06/2013  . Pain in joint, shoulder region 11/27/2012  . VBI (vertebrobasilar insufficiency) 08/22/2012  . Right knee pain 07/29/2012  . TIA (transient ischemic attack) 06/27/2012  . Edema 02/01/2012  . DJD (degenerative  joint disease) 02/02/2011  . 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 Postherpetic neuralgia ? 01/02/2010  . NECK PAIN 10/05/2009  . Palpitations 11/23/2008  . UTI'S, RECURRENT 09/28/2008  . Fibromyalgia 08/15/2007  . FATIGUE 11/19/2006  . 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 Hyperlipidemia 05/21/2006  . Reactive airway disease 01/29/2002    Otelia Limes, PTA 01/31/2016, 11:34 AM  [place], [place], [address] Phone: [phone]   Fax:  [phone]  Name: Samantha [name] MRN: [id number] Date of Birth: 1940-07-08

## 2016-01-31 NOTE — Telephone Encounter (Signed)
Patient fractured her vertebra on Friday.  She is taking hydrocodone every 4-6 hours for the pain as well as a nose spray called Calcitonin. She states that she is going to have PT today.  She states that she should be okay with the prep, but she was worried about her medications interfering with the medication for sedation.   I told her that the medication would be fine, but I was concerned about her pain. Patient insists that she wants to do the colonoscopy.  She states that she will be okay as long as she takes her pain medication.  She was instructed to call us if she is unable to do the prep or her pain gets worse.  Patient feedback was good.

## 2016-01-31 NOTE — Telephone Encounter (Signed)
Ok, thanks  I checked in epic.  Looks like the fracture was 10 days ago and if she feels she can go ahead with the prep, procedure then it is OK with me.  She has probably already held her plavix for the past 3-4 days.

## 2016-02-01 ENCOUNTER — Encounter: Payer: Self-pay | Admitting: Gastroenterology

## 2016-02-01 ENCOUNTER — Encounter: Payer: Medicare Other | Admitting: Gastroenterology

## 2016-02-01 ENCOUNTER — Ambulatory Visit (AMBULATORY_SURGERY_CENTER): Payer: Medicare Other | Admitting: Gastroenterology

## 2016-02-01 VITALS — BP 129/60 | HR 55 | Temp 96.8°F | Resp 14 | Ht 63.5 in | Wt 197.0 lb

## 2016-02-01 DIAGNOSIS — M797 Fibromyalgia: Secondary | ICD-10-CM | POA: Diagnosis not present

## 2016-02-01 DIAGNOSIS — K5730 Diverticulosis of large intestine without perforation or abscess without bleeding: Secondary | ICD-10-CM

## 2016-02-01 DIAGNOSIS — I10 Essential (primary) hypertension: Secondary | ICD-10-CM | POA: Diagnosis not present

## 2016-02-01 DIAGNOSIS — Z85038 Personal history of other malignant neoplasm of large intestine: Secondary | ICD-10-CM

## 2016-02-01 DIAGNOSIS — D124 Benign neoplasm of descending colon: Secondary | ICD-10-CM | POA: Diagnosis not present

## 2016-02-01 DIAGNOSIS — I2510 Atherosclerotic heart disease of native coronary artery without angina pectoris: Secondary | ICD-10-CM | POA: Diagnosis not present

## 2016-02-01 DIAGNOSIS — Z6834 Body mass index (BMI) 34.0-34.9, adult: Secondary | ICD-10-CM | POA: Diagnosis not present

## 2016-02-01 LAB — GLUCOSE, CAPILLARY
GLUCOSE-CAPILLARY: 124 mg/dL — AB (ref 65–99)
Glucose-Capillary: 131 mg/dL — ABNORMAL HIGH (ref 65–99)

## 2016-02-01 MED ORDER — SODIUM CHLORIDE 0.9 % IV SOLN: 500.0000 mL | INTRAVENOUS | Status: DC

## 2016-02-01 NOTE — Patient Instructions (Signed)
YOU HAD AN ENDOSCOPIC PROCEDURE TODAY AT [facility]:   Refer to the procedure report that was given to you for any specific questions about what was found during the examination.  If the procedure report does not answer your questions, please call your gastroenterologist to clarify.  If you requested that your care partner not be given the details of your procedure findings, then the procedure report has been included in a sealed envelope for you to review at your convenience later.  YOU SHOULD EXPECT: Some feelings of bloating in the abdomen. Passage of more gas than usual.  Walking can help get rid of the air that was put into your GI tract during the procedure and reduce the bloating. If you had a lower endoscopy (such as a colonoscopy or flexible sigmoidoscopy) you may notice spotting of blood in your stool or on the toilet paper. If you underwent a bowel prep for your procedure, you may not have a normal bowel movement for a few days.  Please Note:  You might notice some irritation and congestion in your nose or some drainage.  This is from the oxygen used during your procedure.  There is no need for concern and it should clear up in a day or so.  SYMPTOMS TO REPORT IMMEDIATELY:   Following lower endoscopy (colonoscopy or flexible sigmoidoscopy):  Excessive amounts of blood in the stool  Significant tenderness or worsening of abdominal pains  Swelling of the abdomen that is new, acute  Fever of 100F or higher   Following upper endoscopy (EGD)  Vomiting of blood or coffee ground material  New chest pain or pain under the shoulder blades  Painful or persistently difficult swallowing  New shortness of breath  Fever of 100F or higher  Black, tarry-looking stools  For urgent or emergent issues, a gastroenterologist can be reached at any hour by calling [phone].   DIET:  We do recommend a small meal at first, but then you may proceed to your regular diet.  Drink  plenty of fluids but you should avoid alcoholic beverages for 24 hours.  ACTIVITY:  You should plan to take it easy for the rest of today and you should NOT DRIVE or use heavy machinery until tomorrow (because of the sedation medicines used during the test).    FOLLOW UP: Our staff will call the number listed on your records the next business day following your procedure to check on you and address any questions or concerns that you may have regarding the information given to you following your procedure. If we do not reach you, we will leave a message.  However, if you are feeling well and you are not experiencing any problems, there is no need to return our call.  We will assume that you have returned to your regular daily activities without incident.  If any biopsies were taken you will be contacted by phone or by letter within the next 1-3 weeks.  Please call us at [phone] if you have not heard about the biopsies in 3 weeks.    SIGNATURES/CONFIDENTIALITY: You and/or your care partner have signed paperwork which will be entered into your electronic medical record.  These signatures attest to the fact that that the information above on your After Visit Summary has been reviewed and is understood.  Full responsibility of the confidentiality of this discharge information lies with you and/or your care-partner.    Handouts were given to your care partner on polyps,  diverticulosis, and a high fiber diet with liberal fluid intake. Per [name] you may resume your PLAVIX today. You may resume your other current medications today. Your blood sugar was 124 in the recovery room. Await biopsy results. Please call if any questions or concerns.

## 2016-02-01 NOTE — Progress Notes (Signed)
Called to room to assist during endoscopic procedure.  Patient ID and intended procedure confirmed with present staff. Received instructions for my participation in the procedure from the performing physician.  

## 2016-02-01 NOTE — Progress Notes (Signed)
  [facility] Post-op Note  Patient: Samantha [name]  Procedure(s) Performed: colonoscopy  Patient Location: [facility] - Recovery Area  Anesthesia Type: Deep Sedation/Propofol  Level of Consciousness: awake, oriented and patient cooperative  Airway and Oxygen Therapy: Patient Spontanous Breathing  Post-op Pain: none  Post-op Assessment:  Post-op Vital signs reviewed, Patient's Cardiovascular Status Stable, Respiratory Function Stable, Patent Airway, No signs of Nausea or vomiting and Pain level controlled  Post-op Vital Signs: Reviewed and stable  Complications: No apparent anesthesia complications  Yomara Toothman E 9:39 AM

## 2016-02-01 NOTE — Op Note (Signed)
[facility] Patient Name: Samantha [name] Procedure Date: 02/01/2016 9:20 AM MRN: [id number] Endoscopist: Milus Banister , MD Age: 76 Referring MD:  Date of Birth: [birth year] Gender: Female Account #: [id number] Procedure:                Colonoscopy Indications:              High risk colon cancer surveillance: Personal                            history of colon cancer; small colon cancer in                            right colon removed endoscopically 07/2014 (complete                            removal confirmed on right hemicolectomy 10/2014                            path specimen) Medicines:                Monitored Anesthesia Care Procedure:                Pre-Anesthesia Assessment:                           - Prior to the procedure, a History and Physical                            was performed, and patient medications and                            allergies were reviewed. The patient's tolerance of                            previous anesthesia was also reviewed. The risks                            and benefits of the procedure and the sedation                            options and risks were discussed with the patient.                            All questions were answered, and informed consent                            was obtained. Prior Anticoagulants: The patient has                            taken Plavix (clopidogrel), last dose was 5 days                            prior to procedure. ASA Grade Assessment: II - A  patient with mild systemic disease. After reviewing                            the risks and benefits, the patient was deemed in                            satisfactory condition to undergo the procedure.                           After obtaining informed consent, the colonoscope                            was passed under direct vision. Throughout the                            procedure, the patient's blood  pressure, pulse, and                            oxygen saturations were monitored continuously. The                            Model CF-HQ190L [id number] [id number]) scope was introduced                            through the anus and advanced to the the                            ileocolonic anastomosis. The colonoscopy was                            performed without difficulty. The patient tolerated                            the procedure well. The quality of the bowel                            preparation was good. The rectum was photographed. Scope In: 9:24:04 AM Scope Out: 9:32:23 AM Scope Withdrawal Time: 0 hours 5 minutes 53 seconds  Total Procedure Duration: 0 hours 8 minutes 19 seconds  Findings:                 There was evidence of a prior end-to-side                            ileo-colonic anastomosis (right hemicolectomy), the                            anastomosis was normal appearing.                           A 3 mm polyp was found in the descending colon. The                            polyp was sessile. The polyp was removed with a  cold snare. Resection and retrieval were complete.                           Multiple small and large-mouthed diverticula were                            found in the left colon.                           The exam was otherwise without abnormality on                            direct and retroflexion views. Complications:            No immediate complications. Estimated blood loss:                            None. Estimated Blood Loss:     Estimated blood loss: none. Impression:               - End-to-side ileo-colonic anastomosis from 2016                            right hemicolectomy was normal appearing.                           - One 3 mm polyp in the descending colon, removed                            with a cold snare. Resected and retrieved.                           - Diverticulosis in the left colon.                            - The examination was otherwise normal on direct                            and retroflexion views. Recommendation:           - Patient has a contact number available for                            emergencies. The signs and symptoms of potential                            delayed complications were discussed with the                            patient. Return to normal activities tomorrow.                            Written discharge instructions were provided to the                            patient.                           -  Resume previous diet.                           - Continue present medications.                           - Repeat colonoscopy is recommended (likely in 3                            years). The colonoscopy date will be determined                            after pathology results from today's exam become                            available for review. Milus Banister, MD 02/01/2016 9:39:12 AM This report has been signed electronically.

## 2016-02-01 NOTE — Progress Notes (Signed)
No problems noted in the recovery room. maw 

## 2016-02-02 ENCOUNTER — Ambulatory Visit (INDEPENDENT_AMBULATORY_CARE_PROVIDER_SITE_OTHER): Payer: Medicare Other | Admitting: Family

## 2016-02-02 ENCOUNTER — Encounter: Payer: Self-pay | Admitting: Family

## 2016-02-02 ENCOUNTER — Telehealth: Payer: Self-pay

## 2016-02-02 VITALS — BP 130/80 | HR 63 | Temp 98.5°F | Resp 16 | Ht 63.5 in | Wt 201.8 lb

## 2016-02-02 DIAGNOSIS — M47816 Spondylosis without myelopathy or radiculopathy, lumbar region: Secondary | ICD-10-CM | POA: Diagnosis not present

## 2016-02-02 NOTE — Assessment & Plan Note (Addendum)
This is a new problem. 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 Appears stable and improving slightly with calcitonin and pain medication. Recommend referral to orthopedics for further assessment. Add ice and gentle home exercise therapy as tolerated. Back brace as needed. Continue current dosage of hydrocodone-acetaminophen and calcitonin pending referral to orthopedics.

## 2016-02-02 NOTE — Progress Notes (Signed)
Subjective:    Patient ID: Samantha [name], female    DOB: Nov 29, 1940, 76 y.o.   MRN: [id number]  Chief Complaint  Patient presents with  . Hospitalization Follow-up    compression fracture in back, has scoliosis and would like to know if she needs a back brace to help    HPI:  Samantha [name] is a 76 y.o. female who  has a past medical history of Abnormal CT of the chest (2008); Allergy; Asthma; CAD (coronary artery disease); Cataract; Chronic diastolic CHF (congestive heart failure) ([place]); Collagen vascular disease ([place]); Complication of anesthesia; Fibromyalgia; GERD (gastroesophageal reflux disease); H/O hiatal hernia; Heart murmur; Hyperlipidemia; Hypertension; Malignant neoplasm of ascending colon ([place]) (2016); Neuromuscular disorder ([place]); Ocular migraine; OSA (obstructive sleep apnea) (09/2007); Osteoarthritis; Osteoporosis; Pneumonia; PONV (postoperative nausea and vomiting); Reactive airway disease (01/29/2002); Rheumatoid factor positive; Shingles (11/2009); Sleep apnea; Stroke [facility]); TIA (transient ischemic attack); Torn rotator cuff; Tumor, thyroid; Type II diabetes mellitus ([place]); 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 and presents today for a follow up office visit.   Recently evaluated in Urgent Care and diagnosed with an L1/L2 mild compression fracture and started calcitonin and hydrocodone-acetaminophen. Reports taking the medications as prescribed and denies adverse side effects or constipation. She is taking 1/2 tablet in the morning and 1 tablet in the evening which she reports helping with the pain. Pain is gradually improving over time. Denies any numbness or tingling. Pain is described as sharp at times and intense. Some radiating pain down her right upper and lower extremity. No trauma or injury that she can recall.   Allergies  Allergen Reactions  . Bactrim [Sulfamethoxazole-Trimethoprim] Other (See Comments)    See OV 09-15-13, rash-tongue swelling due  to bactrim ?  [name] Cefuroxime Axetil     REACTION: urticaria (hives)  . Oxycodone Nausea And Vomiting  . Pravastatin Other (See Comments)    "muscle breakdown" with profuse sweating  . Seldane [Terfenadine]     REACTION: urticaria (hives)  . Zocor [Simvastatin]     2012 "Muscle breakdown " with profuse sweating  . Tramadol Other (See Comments)    REACTION: vomitting      Outpatient Medications Prior to Visit  Medication Sig Dispense Refill  . acetaminophen (TYLENOL) 500 MG tablet Take 1,000 mg by mouth every 6 (six) hours as needed for moderate pain.    [name] albuterol (PROVENTIL HFA;VENTOLIN HFA) 108 (90 Base) MCG/ACT inhaler Inhale 2 puffs into the lungs every 6 (six) hours as needed for wheezing or shortness of breath. 18 g 3  . atorvastatin (LIPITOR) 20 MG tablet TAKE 1 TABLET (20 MG TOTAL) BY MOUTH DAILY. 90 tablet 2  . butalbital-acetaminophen-caffeine (FIORICET) 50-325-40 MG tablet Take 1 tablet by mouth every 6 (six) hours as needed for headache or migraine. 14 tablet 5  . calcitonin, salmon, (MIACALCIN) 200 UNIT/ACT nasal spray Place 1 spray into alternate nostrils daily. 3.7 mL 12  . CALCIUM PO Take 1 tablet by mouth every morning.     . cetirizine (ZYRTEC) 10 MG tablet Take 1 tablet (10 mg total) by mouth daily. 90 tablet 1  . cholecalciferol (VITAMIN D) 1000 UNITS tablet Take 1,000 Units by mouth every morning.     . cholestyramine (QUESTRAN) 4 g packet Take 4 g by mouth every other day.    . clopidogrel (PLAVIX) 75 MG tablet Take 1 tablet (75 mg total) by mouth daily. 90 tablet 1  . HYDROcodone-acetaminophen (NORCO) 5-325 MG tablet Take 1 tablet by  mouth every 4 (four) hours as needed for moderate pain. 40 tablet 0  . hydrocortisone 2.5 % cream Apply topically 2 (two) times daily. 30 g 0  . lisinopril (PRINIVIL,ZESTRIL) 40 MG tablet Take 1 tablet (40 mg total) by mouth daily. 90 tablet 1  . metformin (FORTAMET) 500 MG (OSM) 24 hr tablet Take 1 tablet (500 mg total) by mouth daily  with breakfast. 90 tablet 1  . Multiple Vitamin (MULTIVITAMIN) capsule Take 1 capsule by mouth daily.      . nebivolol (BYSTOLIC) 10 MG tablet Take 3 tablets (30 mg total) by mouth daily. 90 tablet 9  . nitroGLYCERIN (NITROSTAT) 0.4 MG SL tablet Place 0.4 mg under the tongue every 5 (five) minutes as needed for chest pain (x 3 doses).    . ondansetron (ZOFRAN ODT) 4 MG disintegrating tablet Take 1 tablet (4 mg total) by mouth every 8 (eight) hours as needed for nausea or vomiting. 12 tablet 1  . Polyethyl Glycol-Propyl Glycol (SYSTANE) 0.4-0.3 % GEL Place 1 drop into both eyes daily as needed (dry eyes).     . Potassium 99 MG TABS Take 99 mg by mouth daily.     . pregabalin (LYRICA) 100 MG capsule Take 1 capsule (100 mg total) by mouth as needed (FIBROMYALGIA). 30 capsule 2  . ranitidine (ZANTAC) 300 MG tablet Take 1 tablet (300 mg total) by mouth at bedtime. 90 tablet 3   Facility-Administered Medications Prior to Visit  Medication Dose Route Frequency Provider Last Rate Last Dose  . 0.9 %  sodium chloride infusion  500 mL Intravenous Continuous Milus Banister, MD          Review of Systems  Constitutional: Negative for chills and fever.  Musculoskeletal: Positive for back pain. Negative for arthralgias and myalgias.  Neurological: Negative for weakness and numbness.      Objective:    BP 130/80 (BP Location: Left Arm, Patient Position: Sitting, Cuff Size: Large)   Pulse 63   Temp 98.5 F (36.9 C) (Oral)   Resp 16   Ht 5' 3.5" (1.613 m)   Wt 201 lb 12.8 oz (91.5 kg)   SpO2 98%   BMI 35.19 kg/m  Nursing note and vital signs reviewed.  Physical Exam  Constitutional: She is oriented to person, place, and time. She appears well-developed and well-nourished. No distress.  Cardiovascular: Normal rate, regular rhythm, normal heart sounds and intact distal pulses.   Pulmonary/Chest: Effort normal and breath sounds normal.  Musculoskeletal:  Back - No obvious discoloration or edema  with scoliosis noted. There is tenderness from T12-L3 midline. No crepitus, deformity or muscle spasm. Range of motion is limited in all directions. Negative straight leg raise. Unable to complete Faber's. Distal pulses and sensation are intact and appropriate.                 Neurological: She is alert and oriented to person, place, and time.  Skin: Skin is warm and dry.  Psychiatric: She has a normal mood and affect. Her behavior is normal. Judgment and thought content normal.       Assessment & Plan:   Problem List Items Addressed This Visit      Musculoskeletal and Integument   Spondylosis of lumbar region without myelopathy or radiculopathy - Primary    Severe degenerative changes and scolosis of the lumbar spine noted on x-rays with no obvious fracture. Appears stable and improving slightly with calcitonin and pain medication. Recommend referral to orthopedics for further  assessment. Add ice and gentle home exercise therapy as tolerated. Back brace as needed. Continue current dosage of hydrocodone-acetaminophen and calcitonin pending referral to orthopedics.           I am having Ms. Doyel maintain her multivitamin, Potassium, CALCIUM PO, Polyethyl Glycol-Propyl Glycol, cholecalciferol, ondansetron, cetirizine, acetaminophen, butalbital-acetaminophen-caffeine, hydrocortisone, albuterol, nitroGLYCERIN, metformin, ranitidine, pregabalin, nebivolol, cholestyramine, atorvastatin, clopidogrel, lisinopril, HYDROcodone-acetaminophen, and calcitonin (salmon). We will continue to administer sodium chloride.   Follow-up: Return if symptoms worsen or fail to improve.  Mauricio Po, FNP

## 2016-02-02 NOTE — Patient Instructions (Addendum)
Thank you for choosing [facility].  SUMMARY AND INSTRUCTIONS:  Please continue with your medications as prescribed.   They will call to schedule your appointment with orthopedics.   Ice x 20 minutes every 2 hours as needed and following activity.   Work on Range of motion of back.  Calcitoninin no more than 1 month.   Medication:  Your prescription(s) have been submitted to your pharmacy or been printed and provided for you. Please take as directed and contact our office if you believe you are having problem(s) with the medication(s) or have   Follow up:  If your symptoms worsen or fail to improve, please contact our office for further instruction, or in case of emergency go directly to the emergency room at the closest medical facility.

## 2016-02-02 NOTE — Telephone Encounter (Signed)
  Follow up Call-  Call back number 02/01/2016 08/27/2014  Post procedure Call Back phone  # [phone]  Permission to leave phone message Yes Yes  Some recent data might be hidden    Patient expressed her gratitude for the care that she received in the [place].    Patient questions:  Do you have a fever, pain , or abdominal swelling? No. Pain Score  0 *  Have you tolerated food without any problems? Yes.    Have you been able to return to your normal activities? Yes.    Do you have any questions about your discharge instructions: Diet   No. Medications  No. Follow up visit  No.  Do you have questions or concerns about your Care? No.  Actions: * If pain score is 4 or above: No action needed, pain <4.

## 2016-02-03 ENCOUNTER — Ambulatory Visit: Payer: Medicare Other | Admitting: Physical Therapy

## 2016-02-03 ENCOUNTER — Encounter: Payer: Self-pay | Admitting: Physical Therapy

## 2016-02-03 DIAGNOSIS — R222 Localized swelling, mass and lump, trunk: Secondary | ICD-10-CM | POA: Diagnosis not present

## 2016-02-03 DIAGNOSIS — R262 Difficulty in walking, not elsewhere classified: Secondary | ICD-10-CM | POA: Diagnosis not present

## 2016-02-03 DIAGNOSIS — M6281 Muscle weakness (generalized): Secondary | ICD-10-CM | POA: Diagnosis not present

## 2016-02-03 NOTE — Therapy (Signed)
[place], [place], [address] Phone: [phone]   Fax:  [phone]  Physical Therapy Treatment  Patient Details  Name: Samantha [name] MRN: [id number] Date of Birth: [birth year] Referring Provider: Dr. Marcello Moores  Encounter Date: 02/03/2016      PT End of Session - 02/03/16 1156    Visit Number 4   Number of Visits 9   Date for PT Re-Evaluation 02/14/16   PT Start Time 1109  pt arrived late   PT Stop Time 1150   PT Time Calculation (min) 41 min   Activity Tolerance Patient tolerated treatment well   Behavior During Therapy [facility] for tasks assessed/performed      Past Medical History:  Diagnosis Date  . Abnormal CT of the chest 2008   last CT4-l 2009:  . No f/u suggested   . Allergy   . Asthma   . CAD (coronary artery disease)    a. Coronary CTA 10/16: Coronary Ca score 211, mod non-obstructive CAD with LM mild plaque (25-50%), mid LAD 50-69%. b. Neg nuc 06/2015.  . Cataract    BILATERAL-REMOVED  . Chronic diastolic CHF (congestive heart failure) ([place])   . Collagen vascular disease ([place])    "arterial sclerosis" per pt  . Complication of anesthesia    trouble waking up  . Fibromyalgia   . GERD (gastroesophageal reflux disease)   . H/O hiatal hernia   . Heart murmur   . Hyperlipidemia   . Hypertension   . Malignant neoplasm of ascending colon ([place]) 2016   Minimally invasive right hemicolectomy to be done   . Neuromuscular disorder ([facility])    FIBROMYALGIA  . Ocular migraine   . OSA (obstructive sleep apnea) 09/2007   dx w/ a sleep study, not on  CPAP  . Osteoarthritis   . Osteoporosis   . Pneumonia    "double" in 2004  . PONV (postoperative nausea and vomiting)   . Reactive airway disease 01/29/2002   dx of pseudoasthma / vcd in 2005 and nl sprirometry History of dyspnea, 2011,  improved after several medications were changed around Question of COPD, disproved July 06, 2009 with nl pft's       . Rheumatoid factor positive   . Shingles 11/2009  . Sleep apnea   . Stroke ([place])   . TIA (transient ischemic attack)    x2 - on Plavix for this  . Torn rotator cuff    right worse than left, both are torn  . Tumor, 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  . Type II diabetes mellitus ([place])   . Vaginal cancer ([place]) 1994  . Vaginal dysplasia     Past Surgical History:  Procedure Laterality Date  . ABDOMINAL HYSTERECTOMY  1980   NO oophorectomy per pt   . ANTERIOR CERVICAL DECOMP/DISCECTOMY FUSION  2001   C 3, C4 and C5 plate and screws  . BREAST BIOPSY Right 1999  . BUNIONECTOMY Left ~ 1977  . CATARACT EXTRACTION W/ INTRAOCULAR LENS  IMPLANT, BILATERAL  2012  . COLON SURGERY  10/2014  . EYE SURGERY Bilateral    torq lens for cataracts  . THYROIDECTOMY, PARTIAL  1960's  . VAGINAL MASS EXCISION  1994   "Laser surgery for vaginal cancer; followed by chemotherapy" (06/27/2012)    There were no vitals filed for this visit.      Subjective Assessment - 02/03/16 1111    Subjective The doctor sent me to an  orthopedic doctor for further follow up from my compression fracture. They gave me some exercises for my back. The doctor told me to do what I can tolerate. The NuStep is out. The conitnuous movement is hard for me.    Pertinent History type 2 diabetes, vaginal cancer, colon cancer, rheumatoid factor positive, TIA, fibromyalgia, congestive heart failure, pt reports R knee needs surgery   Patient Stated Goals to get rid of the soreness in my abdomen so I can function better   Currently in Pain? Yes   Pain Score 5    Pain Location Back   Pain Orientation Lower   Pain Descriptors / Indicators Dull   Pain Type Acute pain   Pain Onset 1 to 4 weeks ago   Pain Frequency Constant   Pain Relieving Factors pain meds                         [facility] Adult PT Treatment/Exercise - 02/03/16 0001      Manual Therapy   Myofascial Release To anterior trunk horizontally  and vertically with light pressure and to patients tolerance at surrounding area to incision and on incision, performed periodically throughout manual lymphatic drainage, pt tolerated this well   Manual Lymphatic Drainage (MLD) In Supine: Short neck, superficial and deep abdominals (gently), Lt axilla nodes and Lt inguino-axillary anastomosis, and then focused on anterior Lt trunk below and above transverse watershed directing towards anastomosis.                        [facility] Goals - 02/03/16 1114      [facility] Long Term Goal  #1   Title Pt will report a 50% improvement in abdominal pain when bending forward to allow her complete ADLs   Baseline 02/03/16- 25% improvement   Time 4   Period Weeks   Status On-going     CC Long Term Goal  #2   Title Pt will demonstrate R quad strength of 4/5 to decrease risk of falls    Baseline 3+/5   Time 4   Period Weeks   Status On-going     CC Long Term Goal  #3   Title Pt will be independent in nerve desensitization technique for long term management of pain   Time 4   Period Weeks   Status On-going     CC Long Term Goal  #4   Title Pt will be independent in a home exercise program for continued strengthening   Time 4   Period Weeks   Status On-going            Plan - 02/03/16 1156    Clinical Impression Statement Patient is going to see an orthopedic doctor regarding her compression fracture. She states she can not tolerate the NuStep and the doctor told her to only do what she can tolerate. She did excellent today with myofascial release and manual lymph drainage. She did not complain of any discomfort throughout treatment session. She states she has noticed a 25% improvement in pain since starting therapy.    Rehab Potential Good   Clinical Impairments Affecting Rehab Potential numerous comorbidities; healing L1 compression fracture    PT Frequency 2x / week   PT Duration 4 weeks   PT Treatment/Interventions  ADLs/Self Care Home Management;Therapeutic exercise;Patient/family education;Manual techniques;Manual lymph drainage;Scar mobilization   PT Next Visit Plan  Possibly add ab set exercise? Cont nerve desensitization and  cont to instruct pt in this having her perform MLD if able.   Consulted and Agree with Plan of Care Patient      Patient will benefit from skilled therapeutic intervention in order to improve the following deficits and impairments:  Pain, Decreased strength, Increased edema, Difficulty walking  Visit Diagnosis: Localized swelling, mass and lump, trunk     Problem List Patient Active Problem List   Diagnosis Date Noted  . Spondylosis of lumbar region without myelopathy or radiculopathy 02/02/2016  . CAD in native artery 08/22/2015  . Hypertensive heart disease 07/07/2015  . Essential hypertension 07/07/2015  . SOB (shortness of breath) 07/07/2015  . Dizziness 07/07/2015  . Plantar fasciitis, left 03/22/2015  . Hx of colon cancer, stage I 01/11/2015  . Coronary artery disease due to lipid rich plaque 01/05/2015  . Chronic diastolic CHF (congestive heart failure), NYHA class 2 ([place]) 01/05/2015  . 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 Migraine ([place]) 01/05/2014  . Gait difficulty 10/06/2013  . Pain in joint, shoulder region 11/27/2012  . VBI (vertebrobasilar insufficiency) 08/22/2012  . Right knee pain 07/29/2012  . TIA (transient ischemic attack) 06/27/2012  . Edema 02/01/2012  . DJD (degenerative joint disease) 02/02/2011  . 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 Postherpetic neuralgia ? 01/02/2010  . NECK PAIN 10/05/2009  . Palpitations 11/23/2008  . UTI'S, RECURRENT 09/28/2008  . Fibromyalgia 08/15/2007  . FATIGUE 11/19/2006  . 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 Hyperlipidemia 05/21/2006  . Reactive airway disease 01/29/2002    Allyson Sabal [facility] 02/03/2016, 11:59  AM  [place], [place], [address] Phone: [phone]   Fax:  [phone]  Name: SAARA CABANILLA MRN: [id number] Date of Birth: 1940/07/26  Manus Gunning, PT 02/03/16 11:59 AM

## 2016-02-06 ENCOUNTER — Ambulatory Visit: Payer: Medicare Other | Admitting: Physical Therapy

## 2016-02-06 ENCOUNTER — Encounter: Payer: Self-pay | Admitting: Physical Therapy

## 2016-02-06 DIAGNOSIS — R222 Localized swelling, mass and lump, trunk: Secondary | ICD-10-CM

## 2016-02-06 DIAGNOSIS — M6281 Muscle weakness (generalized): Secondary | ICD-10-CM | POA: Diagnosis not present

## 2016-02-06 DIAGNOSIS — R262 Difficulty in walking, not elsewhere classified: Secondary | ICD-10-CM | POA: Diagnosis not present

## 2016-02-06 NOTE — Therapy (Signed)
[place], [place], [address] Phone: [phone]   Fax:  [phone]  Physical Therapy Treatment  Patient Details  Name: Samantha [name] MRN: [id number] Date of Birth: 1940/04/28 Referring Provider: Dr. Marcello Moores  Encounter Date: 02/06/2016      PT End of Session - 02/06/16 1716    Visit Number 5   Number of Visits 9   Date for PT Re-Evaluation 02/14/16   PT Start Time 1307   PT Stop Time 1349   PT Time Calculation (min) 42 min   Activity Tolerance Patient tolerated treatment well   Behavior During Therapy [facility] for tasks assessed/performed      Past Medical History:  Diagnosis Date  . Abnormal CT of the chest 2008   last CT4-l 2009:  . No f/u suggested   . Allergy   . Asthma   . CAD (coronary artery disease)    a. Coronary CTA 10/16: Coronary Ca score 211, mod non-obstructive CAD with LM mild plaque (25-50%), mid LAD 50-69%. b. Neg nuc 06/2015.  . Cataract    BILATERAL-REMOVED  . Chronic diastolic CHF (congestive heart failure) ([place])   . Collagen vascular disease ([place])    "arterial sclerosis" per pt  . Complication of anesthesia    trouble waking up  . Fibromyalgia   . GERD (gastroesophageal reflux disease)   . H/O hiatal hernia   . Heart murmur   . Hyperlipidemia   . Hypertension   . Malignant neoplasm of ascending colon ([place]) 2016   Minimally invasive right hemicolectomy to be done   . Neuromuscular disorder (HCC)    FIBROMYALGIA  . Ocular migraine   . OSA (obstructive sleep apnea) 09/2007   dx w/ a sleep study, not on  CPAP  . Osteoarthritis   . Osteoporosis   . Pneumonia    "double" in 2004  . PONV (postoperative nausea and vomiting)   . Reactive airway disease 01/29/2002   dx of pseudoasthma / vcd in 2005 and nl sprirometry History of dyspnea, 2011,  improved after several medications were changed around Question of COPD, disproved July 06, 2009 with nl pft's      . Rheumatoid  factor positive   . Shingles 11/2009  . Sleep apnea   . Stroke ([place])   . TIA (transient ischemic attack)    x2 - on Plavix for this  . Torn rotator cuff    right worse than left, both are torn  . Tumor, 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  . Type II diabetes mellitus ([place])   . 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 Vaginal dysplasia     Past Surgical History:  Procedure Laterality Date  . ABDOMINAL HYSTERECTOMY  1980   NO oophorectomy per pt   . ANTERIOR CERVICAL DECOMP/DISCECTOMY FUSION  2001   C 3, C4 and C5 plate and screws  . BREAST BIOPSY Right 1999  . BUNIONECTOMY Left ~ 1977  . CATARACT EXTRACTION W/ INTRAOCULAR LENS  IMPLANT, BILATERAL  2012  . COLON SURGERY  10/2014  . EYE SURGERY Bilateral    torq lens for cataracts  . THYROIDECTOMY, PARTIAL  1960's  . VAGINAL MASS EXCISION  1994   "Laser surgery for vaginal cancer; followed by chemotherapy" (06/27/2012)    There were no vitals filed for this visit.      Subjective Assessment - 02/06/16 1309    Subjective The doctor told me to take the hydocodeine as needed  but I am not needing to take nearly as much. Last night I was in terrible pain so I had to take it.    Pertinent History type 2 diabetes, vaginal cancer, colon cancer, rheumatoid factor positive, TIA, fibromyalgia, congestive heart failure, pt reports R knee needs surgery   Patient Stated Goals to get rid of the soreness in my abdomen so I can function better   Currently in Pain? No/denies   Pain Score 0-No pain                         [facility] Adult PT Treatment/Exercise - 02/06/16 0001      Lumbar Exercises: Stretches   Pelvic Tilt Other (comment)  10 reps with 5 sec holds     Manual Therapy   Myofascial Release To anterior trunk horizontally and vertically with light pressure and to patients tolerance at surrounding area to incision and on incision, performed periodically throughout manual lymphatic drainage, pt tolerated this well    Manual Lymphatic Drainage (MLD) In Supine: Short neck, superficial and deep abdominals (gently), Lt axilla nodes and Lt inguino-axillary anastomosis, and then focused on anterior Lt trunk below and above transverse watershed directing towards anastomosis.                        [facility] Goals - 02/03/16 1114      CC Long Term Goal  #1   Title Pt will report a 50% improvement in abdominal pain when bending forward to allow her complete ADLs   Baseline 02/03/16- 25% improvement   Time 4   Period Weeks   Status On-going     CC Long Term Goal  #2   Title Pt will demonstrate R quad strength of 4/5 to decrease risk of falls    Baseline 3+/5   Time 4   Period Weeks   Status On-going     CC Long Term Goal  #3   Title Pt will be independent in nerve desensitization technique for long term management of pain   Time 4   Period Weeks   Status On-going     CC Long Term Goal  #4   Title Pt will be independent in a home exercise program for continued strengthening   Time 4   Period Weeks   Status On-going            Plan - 02/06/16 1716    Clinical Impression Statement Patient was able to tolerate more myofascial release directly over her scar line today only reporting discomfort and not pain. Patient had pain with more aggressive myofascial superior to her scar line. Continued with MLD to area superior to scar line with pt reporting no discomfort with this. Issued pelvic tilts today to help with lack of core strength espeically with her recent compression fracture. Pt did not report any pain with these.    Rehab Potential Good   Clinical Impairments Affecting Rehab Potential numerous comorbidities; healing L1 compression fracture    PT Frequency 2x / week   PT Duration 4 weeks   PT Treatment/Interventions ADLs/Self Care Home Management;Therapeutic exercise;Patient/family education;Manual techniques;Manual lymph drainage;Scar mobilization   PT Next Visit Plan  assess if pt had any pain from pelvic tilt - if not add to HEP and possibly progress, Cont nerve desensitization and cont to instruct pt in this having her perform MLD if able.   Consulted and Agree with Plan of Care  Patient      Patient will benefit from skilled therapeutic intervention in order to improve the following deficits and impairments:  Pain, Decreased strength, Increased edema, Difficulty walking  Visit Diagnosis: Localized swelling, mass and lump, trunk  Muscle weakness (generalized)     Problem List Patient Active Problem List   Diagnosis Date Noted  . Spondylosis of lumbar region without myelopathy or radiculopathy 02/02/2016  . CAD in native artery 08/22/2015  . Hypertensive heart disease 07/07/2015  . Essential hypertension 07/07/2015  . SOB (shortness of breath) 07/07/2015  . Dizziness 07/07/2015  . Plantar fasciitis, left 03/22/2015  . Hx of colon cancer, stage I 01/11/2015  . Coronary artery disease due to lipid rich plaque 01/05/2015  . Chronic diastolic CHF (congestive heart failure), NYHA class 2 ([place]) 01/05/2015  . 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 Migraine (Ocular) 01/05/2014  . Gait difficulty 10/06/2013  . Pain in joint, shoulder region 11/27/2012  . VBI (vertebrobasilar insufficiency) 08/22/2012  . Right knee pain 07/29/2012  . TIA (transient ischemic attack) 06/27/2012  . Edema 02/01/2012  . DJD (degenerative joint disease) 02/02/2011  . 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 Postherpetic neuralgia ? 01/02/2010  . NECK PAIN 10/05/2009  . Palpitations 11/23/2008  . UTI'S, RECURRENT 09/28/2008  . Fibromyalgia 08/15/2007  . FATIGUE 11/19/2006  . 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 Hyperlipidemia 05/21/2006  . Reactive airway disease 01/29/2002    Allyson Sabal [facility] 02/06/2016, 5:20 PM  [place], [place], [address] Phone: [phone]   Fax:  [phone]  Name: Samantha [name] MRN: [id number] Date of Birth: [birth year]  Manus Gunning, PT 02/06/16 5:20 PM

## 2016-02-10 ENCOUNTER — Encounter: Payer: Self-pay | Admitting: Gastroenterology

## 2016-02-10 ENCOUNTER — Ambulatory Visit: Payer: Medicare Other | Admitting: Physical Therapy

## 2016-02-10 DIAGNOSIS — R222 Localized swelling, mass and lump, trunk: Secondary | ICD-10-CM | POA: Diagnosis not present

## 2016-02-10 DIAGNOSIS — M6281 Muscle weakness (generalized): Secondary | ICD-10-CM

## 2016-02-10 DIAGNOSIS — R262 Difficulty in walking, not elsewhere classified: Secondary | ICD-10-CM

## 2016-02-10 NOTE — Therapy (Signed)
[place], [place], [address] Phone: [phone]   Fax:  [phone]  Physical Therapy Treatment  Patient Details  Name: Samantha [name] MRN: [id number] Date of Birth: 07-19-1940 Referring Provider: Dr. Marcello Moores  Encounter Date: 02/10/2016      PT End of Session - 02/10/16 1210    Visit Number 6   Number of Visits 9   Date for PT Re-Evaluation 02/14/16   PT Start Time [id number]   PT Stop Time 1130   PT Time Calculation (min) 43 min   Activity Tolerance Patient tolerated treatment well   Behavior During Therapy [facility] for tasks assessed/performed      Past Medical History:  Diagnosis Date  . Abnormal CT of the chest 2008   last CT4-l 2009:  . No f/u suggested   . Allergy   . Asthma   . CAD (coronary artery disease)    a. Coronary CTA 10/16: Coronary Ca score 211, mod non-obstructive CAD with LM mild plaque (25-50%), mid LAD 50-69%. b. Neg nuc 06/2015.  . Cataract    BILATERAL-REMOVED  . Chronic diastolic CHF (congestive heart failure) ([place])   . Collagen vascular disease ([place])    "arterial sclerosis" per pt  . Complication of anesthesia    trouble waking up  . Fibromyalgia   . GERD (gastroesophageal reflux disease)   . H/O hiatal hernia   . Heart murmur   . Hyperlipidemia   . Hypertension   . Malignant neoplasm of ascending colon ([place]) 2016   Minimally invasive right hemicolectomy to be done   . Neuromuscular disorder (HCC)    FIBROMYALGIA  . Ocular migraine   . OSA (obstructive sleep apnea) 09/2007   dx w/ a sleep study, not on  CPAP  . Osteoarthritis   . Osteoporosis   . Pneumonia    "double" in 2004  . PONV (postoperative nausea and vomiting)   . Reactive airway disease 01/29/2002   dx of pseudoasthma / vcd in 2005 and nl sprirometry History of dyspnea, 2011,  improved after several medications were changed around Question of COPD, disproved July 06, 2009 with nl pft's      . Rheumatoid  factor positive   . Shingles 11/2009  . Sleep apnea   . Stroke ([place])   . TIA (transient ischemic attack)    x2 - on Plavix for this  . Torn rotator cuff    right worse than left, both are torn  . Tumor, 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  . Type II diabetes mellitus ([place])   . 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 Vaginal dysplasia     Past Surgical History:  Procedure Laterality Date  . ABDOMINAL HYSTERECTOMY  1980   NO oophorectomy per pt   . ANTERIOR CERVICAL DECOMP/DISCECTOMY FUSION  2001   C 3, C4 and C5 plate and screws  . BREAST BIOPSY Right 1999  . BUNIONECTOMY Left ~ 1977  . CATARACT EXTRACTION W/ INTRAOCULAR LENS  IMPLANT, BILATERAL  2012  . COLON SURGERY  10/2014  . EYE SURGERY Bilateral    torq lens for cataracts  . THYROIDECTOMY, PARTIAL  1960's  . VAGINAL MASS EXCISION  1994   "Laser surgery for vaginal cancer; followed by chemotherapy" (06/27/2012)    There were no vitals filed for this visit.      Subjective Assessment - 02/10/16 1049    Subjective Pt  has been having trouble with her Lucianne Lei this  morning.   Her abdomen and her back have been bothering her also.  She has been tender from fibromyalgia too     Pertinent History type 2 diabetes, vaginal cancer, colon cancer, rheumatoid factor positive, TIA, fibromyalgia, congestive heart failure, pt reports R knee needs surgery   Patient Stated Goals to get rid of the soreness in my abdomen so I can function better   Currently in Pain? Yes   Pain Score 4    Pain Location Abdomen   Pain Orientation Anterior   Pain Descriptors / Indicators Dull  sharper than an ache   Pain Type Chronic pain   Pain Onset More than a month ago  past few days the pain has been a little more than ususal    Pain Frequency Constant   Aggravating Factors  crunching leaning forward.    Pain Relieving Factors has to support it at night, the manual lymph drainage seems to be helping.     Effect of Pain on Daily Activities unable to bend  forward                          [facility] Adult PT Treatment/Exercise - 02/10/16 0001      Manual Therapy   Myofascial Release To anterior trunk horizontally and vertically with light pressure and to patients tolerance at surrounding area to incision and on incision, performed periodically throughout manual lymphatic drainage, pt tolerated this well   Manual Lymphatic Drainage (MLD) In Supine: Short neck, superficial and deep abdominals (gently), Lt axilla nodes and Lt inguino-axillary anastomosis, and then focused on anterior Lt trunk below and above transverse watershed directing towards anastomosis.[name] then to right sidelying for posterior interinguinal anastamosis and back                         [facility] Goals - 02/03/16 1114      CC Long Term Goal  #1   Title Pt will report a 50% improvement in abdominal pain when bending forward to allow her complete ADLs   Baseline 02/03/16- 25% improvement   Time 4   Period Weeks   Status On-going     CC Long Term Goal  #2   Title Pt will demonstrate R quad strength of 4/5 to decrease risk of falls    Baseline 3+/5   Time 4   Period Weeks   Status On-going     CC Long Term Goal  #3   Title Pt will be independent in nerve desensitization technique for long term management of pain   Time 4   Period Weeks   Status On-going     CC Long Term Goal  #4   Title Pt will be independent in a home exercise program for continued strengthening   Time 4   Period Weeks   Status On-going            Plan - 02/10/16 1210    Clinical Impression Statement Pt is having a rough day today, but felft better after session  Did not progress exercise today because of her pain, but she might be ready to progress next session    Rehab Potential Good   Clinical Impairments Affecting Rehab Potential numerous comorbidities; healing L1 compression fracture    PT Frequency 2x / week   PT Duration 4 weeks   PT  Treatment/Interventions ADLs/Self Care Home Management;Therapeutic exercise;Patient/family education;Manual techniques;Manual lymph drainage;Scar mobilization  PT Next Visit Plan assess if pt had any pain from pelvic tilt - if not add to HEP and possibly progress, Cont nerve desensitization and cont to instruct pt in this having her perform MLD if able.      Patient will benefit from skilled therapeutic intervention in order to improve the following deficits and impairments:  Pain, Decreased strength, Increased edema, Difficulty walking  Visit Diagnosis: Localized swelling, mass and lump, trunk  Muscle weakness (generalized)  Difficulty in walking, not elsewhere classified     Problem List Patient Active Problem List   Diagnosis Date Noted  . Spondylosis of lumbar region without myelopathy or radiculopathy 02/02/2016  . CAD in native artery 08/22/2015  . Hypertensive heart disease 07/07/2015  . Essential hypertension 07/07/2015  . SOB (shortness of breath) 07/07/2015  . Dizziness 07/07/2015  . Plantar fasciitis, left 03/22/2015  . Hx of colon cancer, stage I 01/11/2015  . Coronary artery disease due to lipid rich plaque 01/05/2015  . Chronic diastolic CHF (congestive heart failure), NYHA class 2 ([place]) 01/05/2015  . 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 Migraine (Ocular) 01/05/2014  . Gait difficulty 10/06/2013  . Pain in joint, shoulder region 11/27/2012  . VBI (vertebrobasilar insufficiency) 08/22/2012  . Right knee pain 07/29/2012  . TIA (transient ischemic attack) 06/27/2012  . Edema 02/01/2012  . DJD (degenerative joint disease) 02/02/2011  . 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 Postherpetic neuralgia ? 01/02/2010  . NECK PAIN 10/05/2009  . Palpitations 11/23/2008  . UTI'S, RECURRENT 09/28/2008  . Fibromyalgia 08/15/2007  . FATIGUE 11/19/2006  . DM II (diabetes mellitus, type II), w/ neuropathy 05/21/2006   . Hyperlipidemia 05/21/2006  . Reactive airway disease 01/29/2002   Donato Heinz. Owens Shark PT  Norwood Levo 02/10/2016, 12:13 PM  [place], [place], [address] Phone: [phone]   Fax:  [phone]  Name: Samantha [name] MRN: [id number] Date of Birth: 04-Mar-1940

## 2016-02-13 ENCOUNTER — Ambulatory Visit: Payer: Medicare Other | Admitting: Physical Therapy

## 2016-02-13 DIAGNOSIS — R262 Difficulty in walking, not elsewhere classified: Secondary | ICD-10-CM | POA: Diagnosis not present

## 2016-02-13 DIAGNOSIS — M6281 Muscle weakness (generalized): Secondary | ICD-10-CM | POA: Diagnosis not present

## 2016-02-13 DIAGNOSIS — R222 Localized swelling, mass and lump, trunk: Secondary | ICD-10-CM | POA: Diagnosis not present

## 2016-02-13 NOTE — Therapy (Signed)
[place], [place], [address] Phone: [phone]   Fax:  [phone]  Physical Therapy Treatment  Patient Details  Name: Samantha [name] MRN: [id number] Date of Birth: [birth year] Referring Provider: Dr. Marcello Moores  Encounter Date: 02/13/2016      PT End of Session - 02/13/16 1411    Visit Number 7   Number of Visits 9   Date for PT Re-Evaluation 02/14/16   PT Start Time 1315   PT Stop Time 1353   PT Time Calculation (min) 38 min   Activity Tolerance Patient tolerated treatment well   Behavior During Therapy [facility] for tasks assessed/performed      Past Medical History:  Diagnosis Date  . Abnormal CT of the chest 2008   last CT4-l 2009:  . No f/u suggested   . Allergy   . Asthma   . CAD (coronary artery disease)    a. Coronary CTA 10/16: Coronary Ca score 211, mod non-obstructive CAD with LM mild plaque (25-50%), mid LAD 50-69%. b. Neg nuc 06/2015.  . Cataract    BILATERAL-REMOVED  . Chronic diastolic CHF (congestive heart failure) ([place])   . Collagen vascular disease ([place])    "arterial sclerosis" per pt  . Complication of anesthesia    trouble waking up  . Fibromyalgia   . GERD (gastroesophageal reflux disease)   . H/O hiatal hernia   . Heart murmur   . Hyperlipidemia   . Hypertension   . Malignant neoplasm of ascending colon ([place]) 2016   Minimally invasive right hemicolectomy to be done   . Neuromuscular disorder (HCC)    FIBROMYALGIA  . Ocular migraine   . OSA (obstructive sleep apnea) 09/2007   dx w/ a sleep study, not on  CPAP  . Osteoarthritis   . Osteoporosis   . Pneumonia    "double" in 2004  . PONV (postoperative nausea and vomiting)   . Reactive airway disease 01/29/2002   dx of pseudoasthma / vcd in 2005 and nl sprirometry History of dyspnea, 2011,  improved after several medications were changed around Question of COPD, disproved July 06, 2009 with nl pft's      . Rheumatoid  factor positive   . Shingles 11/2009  . Sleep apnea   . Stroke ([place])   . TIA (transient ischemic attack)    x2 - on Plavix for this  . Torn rotator cuff    right worse than left, both are torn  . Tumor, 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  . Type II diabetes mellitus ([place])   . 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 Vaginal dysplasia     Past Surgical History:  Procedure Laterality Date  . ABDOMINAL HYSTERECTOMY  1980   NO oophorectomy per pt   . ANTERIOR CERVICAL DECOMP/DISCECTOMY FUSION  2001   C 3, C4 and C5 plate and screws  . BREAST BIOPSY Right 1999  . BUNIONECTOMY Left ~ 1977  . CATARACT EXTRACTION W/ INTRAOCULAR LENS  IMPLANT, BILATERAL  2012  . COLON SURGERY  10/2014  . EYE SURGERY Bilateral    torq lens for cataracts  . THYROIDECTOMY, PARTIAL  1960's  . VAGINAL MASS EXCISION  1994   "Laser surgery for vaginal cancer; followed by chemotherapy" (06/27/2012)    There were no vitals filed for this visit.      Subjective Assessment - 02/13/16 1318    Subjective "I just have some pain across here (across entire upper  abdomen); it woke me up last night it was so bad."  Was busy all weekend, wanting to try new activities, and may have overdone it.  Wants to take it easy on the abdomen today so as not to aggravate things.   Currently in Pain? Yes   Pain Score 7    Pain Location Abdomen   Pain Orientation Right;Left;Upper   Pain Descriptors / Indicators Sharp;Other (Comment)  comes and goes   Aggravating Factors  may have done too many activities over the weekend; may be diverticulitis   Pain Relieving Factors compression (Spandex) tank top sometimes helps                         [facility] Adult PT Treatment/Exercise - 02/13/16 0001      Manual Therapy   Manual Lymphatic Drainage (MLD) In supine:  diaphragmatic breathing, then short neck and superficial abdomen; right axilla and right groin and inguino-axillary anastomosis; right abdomen directing toward  right anastomosis; left axilla and inguino-axillary anastomosis, then left abdomen toward anastomosis; then in right sidelying, across lower back toward right inguino-axillary anastomosis and repeating right inguino-axillary anastomosis in supine after that.                        [facility] Goals - 02/03/16 1114      CC Long Term Goal  #1   Title Pt will report a 50% improvement in abdominal pain when bending forward to allow her complete ADLs   Baseline 02/03/16- 25% improvement   Time 4   Period Weeks   Status On-going     CC Long Term Goal  #2   Title Pt will demonstrate R quad strength of 4/5 to decrease risk of falls    Baseline 3+/5   Time 4   Period Weeks   Status On-going     CC Long Term Goal  #3   Title Pt will be independent in nerve desensitization technique for long term management of pain   Time 4   Period Weeks   Status On-going     CC Long Term Goal  #4   Title Pt will be independent in a home exercise program for continued strengthening   Time 4   Period Weeks   Status On-going            Plan - 02/13/16 1412    Clinical Impression Statement Patient reported increased upper abdominal pain over the weekend, so requested to avoid manual work and exercise that might exacerbate that pain.  Focus was on gentle manual lymph drainage today.   Rehab Potential Good   Clinical Impairments Affecting Rehab Potential numerous comorbidities; healing L1 compression fracture    PT Frequency 2x / week   PT Duration 4 weeks   PT Treatment/Interventions ADLs/Self Care Home Management;Therapeutic exercise;Patient/family education;Manual techniques;Manual lymph drainage;Scar mobilization   PT Next Visit Plan Will need renewal next.  Assess goals. If patient is feeling better, focus some on abdominal strengthening.   Consulted and Agree with Plan of Care Patient      Patient will benefit from skilled therapeutic intervention in order to improve the  following deficits and impairments:  Pain, Decreased strength, Increased edema, Difficulty walking  Visit Diagnosis: Localized swelling, mass and lump, trunk     Problem List Patient Active Problem List   Diagnosis Date Noted  . Spondylosis of lumbar region without myelopathy or radiculopathy 02/02/2016  .  CAD in native artery 08/22/2015  . Hypertensive heart disease 07/07/2015  . Essential hypertension 07/07/2015  . SOB (shortness of breath) 07/07/2015  . Dizziness 07/07/2015  . Plantar fasciitis, left 03/22/2015  . Hx of colon cancer, stage I 01/11/2015  . Coronary artery disease due to lipid rich plaque 01/05/2015  . Chronic diastolic CHF (congestive heart failure), NYHA class 2 ([place]) 01/05/2015  . 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 Migraine (Ocular) 01/05/2014  . Gait difficulty 10/06/2013  . Pain in joint, shoulder region 11/27/2012  . VBI (vertebrobasilar insufficiency) 08/22/2012  . Right knee pain 07/29/2012  . TIA (transient ischemic attack) 06/27/2012  . Edema 02/01/2012  . DJD (degenerative joint disease) 02/02/2011  . 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 Postherpetic neuralgia ? 01/02/2010  . NECK PAIN 10/05/2009  . Palpitations 11/23/2008  . UTI'S, RECURRENT 09/28/2008  . Fibromyalgia 08/15/2007  . FATIGUE 11/19/2006  . 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 Hyperlipidemia 05/21/2006  . Reactive airway disease 01/29/2002    [place],DONNA 02/13/2016, 2:14 PM  [place], [place], [address] Phone: [phone]   Fax:  [phone]  Name: SUNITA FADEN MRN: [id number] Date of Birth: January 17, 1941   Serafina Royals, PT 02/13/16 2:15 PM

## 2016-02-16 ENCOUNTER — Ambulatory Visit: Payer: Medicare Other | Admitting: Physical Therapy

## 2016-02-20 ENCOUNTER — Encounter: Payer: Self-pay | Admitting: Physical Therapy

## 2016-02-20 ENCOUNTER — Ambulatory Visit: Payer: Medicare Other | Admitting: Physical Therapy

## 2016-02-20 DIAGNOSIS — M6281 Muscle weakness (generalized): Secondary | ICD-10-CM | POA: Diagnosis not present

## 2016-02-20 DIAGNOSIS — R222 Localized swelling, mass and lump, trunk: Secondary | ICD-10-CM | POA: Diagnosis not present

## 2016-02-20 DIAGNOSIS — R262 Difficulty in walking, not elsewhere classified: Secondary | ICD-10-CM

## 2016-02-20 NOTE — Therapy (Signed)
[place], [place], [address] Phone: [phone]   Fax:  [phone]  Physical Therapy Treatment  Patient Details  Name: Samantha [name] MRN: [id number] Date of Birth: 10/03/1940 Referring Provider: Dr. Marcello Moores  Encounter Date: 02/20/2016      PT End of Session - 02/20/16 1423    Visit Number 8   Number of Visits 9   Date for PT Re-Evaluation 03/19/16   PT Start Time 7253   PT Stop Time 1433   PT Time Calculation (min) 40 min   Activity Tolerance Patient tolerated treatment well   Behavior During Therapy [facility] for tasks assessed/performed      Past Medical History:  Diagnosis Date  . Abnormal CT of the chest 2008   last CT4-l 2009:  . No f/u suggested   . Allergy   . Asthma   . CAD (coronary artery disease)    a. Coronary CTA 10/16: Coronary Ca score 211, mod non-obstructive CAD with LM mild plaque (25-50%), mid LAD 50-69%. b. Neg nuc 06/2015.  . Cataract    BILATERAL-REMOVED  . Chronic diastolic CHF (congestive heart failure) ([place])   . Collagen vascular disease ([place])    "arterial sclerosis" per pt  . Complication of anesthesia    trouble waking up  . Fibromyalgia   . GERD (gastroesophageal reflux disease)   . H/O hiatal hernia   . Heart murmur   . Hyperlipidemia   . Hypertension   . Malignant neoplasm of ascending colon ([place]) 2016   Minimally invasive right hemicolectomy to be done   . Neuromuscular disorder (HCC)    FIBROMYALGIA  . Ocular migraine   . OSA (obstructive sleep apnea) 09/2007   dx w/ a sleep study, not on  CPAP  . Osteoarthritis   . Osteoporosis   . Pneumonia    "double" in 2004  . PONV (postoperative nausea and vomiting)   . Reactive airway disease 01/29/2002   dx of pseudoasthma / vcd in 2005 and nl sprirometry History of dyspnea, 2011,  improved after several medications were changed around Question of COPD, disproved July 06, 2009 with nl pft's      . Rheumatoid  factor positive   . Shingles 11/2009  . Sleep apnea   . Stroke ([place])   . TIA (transient ischemic attack)    x2 - on Plavix for this  . Torn rotator cuff    right worse than left, both are torn  . Tumor, 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  . Type II diabetes mellitus ([place])   . 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 Vaginal dysplasia     Past Surgical History:  Procedure Laterality Date  . ABDOMINAL HYSTERECTOMY  1980   NO oophorectomy per pt   . ANTERIOR CERVICAL DECOMP/DISCECTOMY FUSION  2001   C 3, C4 and C5 plate and screws  . BREAST BIOPSY Right 1999  . BUNIONECTOMY Left ~ 1977  . CATARACT EXTRACTION W/ INTRAOCULAR LENS  IMPLANT, BILATERAL  2012  . COLON SURGERY  10/2014  . EYE SURGERY Bilateral    torq lens for cataracts  . THYROIDECTOMY, PARTIAL  1960's  . VAGINAL MASS EXCISION  1994   "Laser surgery for vaginal cancer; followed by chemotherapy" (06/27/2012)    There were no vitals filed for this visit.      Subjective Assessment - 02/20/16 1357    Subjective I have been having eye pain. I got soap in  my eye in the shower. My abdomen feels a lot better. I had some pain but I got through that. I have been having more trouble with pain in my back. I go to the orthopedic doctor next week on the 31st. I am not taking any pain medicine.    Pertinent History type 2 diabetes, vaginal cancer, colon cancer, rheumatoid factor positive, TIA, fibromyalgia, congestive heart failure, pt reports R knee needs surgery   Patient Stated Goals to get rid of the soreness in my abdomen so I can function better   Currently in Pain? No/denies   Pain Score 0-No pain                         [facility] Adult PT Treatment/Exercise - 02/20/16 0001      Lumbar Exercises: Stretches   Pelvic Tilt Other (comment)  10 reps with 10 sec holds, with small marches x 10 reps     Lumbar Exercises: Supine   Other Supine Lumbar Exercises Meeks decompression exercises x 5 with 3 sec holds                         Long Term Clinic Goals - 02/20/16 1359      CC Long Term Goal  #1   Title Pt will report a 50% improvement in abdominal pain when bending forward to allow her complete ADLs   Baseline 02/03/16- 25% improvement, 02/20/16- a little over 50% improvement   Time 4   Period Weeks   Status Achieved     CC Long Term Goal  #2   Title Pt will demonstrate R quad strength of 4/5 to decrease risk of falls    Baseline 3+/5, 02/20/16- 3+/5   Time 4   Period Weeks   Status On-going     CC Long Term Goal  #3   Title Pt will be independent in nerve desensitization technique for long term management of pain   Baseline 02/20/16- pt is independent with this   Time 4   Period Weeks   Status Achieved     CC Long Term Goal  #4   Title Pt will be independent in a home exercise program for continued strengthening   Time 4   Period Weeks   Status On-going            Plan - 02/20/16 1406    Clinical Impression Statement Pt has now met her abdominal pain goal and reports at least 50% improvement in her abdominal pain. She had more back pain over the weekend than abdominal pain. She does have a healing L1 compression fracture. Core strength is still an issue and pt would benefit from core strengthening exercises as well as lower extremity strengthening exercises. She would benefit from further skilled PT services to address this with a focus on strengthening instead of on pain management since pt has met that goal. Continued with core strengthening today and progressing pelvic tilts to include LE movement while holding core tight.    Rehab Potential Good   Clinical Impairments Affecting Rehab Potential numerous comorbidities; healing L1 compression fracture    PT Frequency 2x / week   PT Duration 4 weeks   PT Treatment/Interventions ADLs/Self Care Home Management;Therapeutic exercise;Patient/family education;Manual techniques;Manual lymph drainage;Scar mobilization   PT  Next Visit Plan Focus on abdominal strengthening, meeks decompression exercises, pelvic tilts with small marches etc   PT Home Exercise Plan Meeks decompression  exercises, pelvic tilts   Consulted and Agree with Plan of Care Patient      Patient will benefit from skilled therapeutic intervention in order to improve the following deficits and impairments:  Pain, Decreased strength, Increased edema, Difficulty walking  Visit Diagnosis: Localized swelling, mass and lump, trunk - Plan: PT plan of care cert/re-cert  Muscle weakness (generalized) - Plan: PT plan of care cert/re-cert  Difficulty in walking, not elsewhere classified - Plan: PT plan of care cert/re-cert       G-Codes - February 27, 2016 1412    Functional Assessment Tool Used Clinical judgement   Functional Limitation Mobility: Walking and moving around   Mobility: Walking and Moving Around Current Status [phone]) At least 40 percent but less than 60 percent impaired, limited or restricted   Mobility: Walking and Moving Around Goal Status [phone]) At least 1 percent but less than 20 percent impaired, limited or restricted      Problem List Patient Active Problem List   Diagnosis Date Noted  . Spondylosis of lumbar region without myelopathy or radiculopathy 02/02/2016  . CAD in native artery 08/22/2015  . Hypertensive heart disease 07/07/2015  . Essential hypertension 07/07/2015  . SOB (shortness of breath) 07/07/2015  . Dizziness 07/07/2015  . Plantar fasciitis, left 03/22/2015  . Hx of colon cancer, stage I 01/11/2015  . Coronary artery disease due to lipid rich plaque 01/05/2015  . Chronic diastolic CHF (congestive heart failure), NYHA class 2 ([place]) 01/05/2015  . 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 Migraine (Ocular) 01/05/2014  . Gait difficulty 10/06/2013  . Pain in joint, shoulder region 11/27/2012  . VBI (vertebrobasilar insufficiency) 08/22/2012  . Right  knee pain 07/29/2012  . TIA (transient ischemic attack) 06/27/2012  . Edema 02/01/2012  . DJD (degenerative joint disease) 02/02/2011  . 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 Postherpetic neuralgia ? 01/02/2010  . NECK PAIN 10/05/2009  . Palpitations 11/23/2008  . UTI'S, RECURRENT 09/28/2008  . Fibromyalgia 08/15/2007  . FATIGUE 11/19/2006  . 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 Hyperlipidemia 05/21/2006  . Reactive airway disease 01/29/2002    Allyson Sabal [facility] 27-Feb-2016, 2:47 PM  [place], [place], [address] Phone: [phone]   Fax:  [phone]  Name: Samantha [name] MRN: [id number] Date of Birth: April 11, 1940  Manus Gunning, PT 02/27/16 2:47 PM

## 2016-02-20 NOTE — Patient Instructions (Signed)
(  Home) Flexion: Pelvic Tilt    Lie with neck supported, knees bent, feet flat. Tighten abdominal muscles, pushing back down against surface. Do not push down with legs. Repeat _10___ times per set. Hold 10 sec each. Do _1___ sets per session. Do __7__ sessions per week.  Copyright  VHI. All rights reserved.

## 2016-02-23 ENCOUNTER — Encounter: Payer: Self-pay | Admitting: Cardiology

## 2016-02-23 ENCOUNTER — Ambulatory Visit (INDEPENDENT_AMBULATORY_CARE_PROVIDER_SITE_OTHER): Payer: Medicare Other | Admitting: Cardiology

## 2016-02-23 VITALS — BP 116/70 | HR 70 | Ht 62.0 in | Wt 195.2 lb

## 2016-02-23 DIAGNOSIS — I5032 Chronic diastolic (congestive) heart failure: Secondary | ICD-10-CM

## 2016-02-23 DIAGNOSIS — E782 Mixed hyperlipidemia: Secondary | ICD-10-CM | POA: Diagnosis not present

## 2016-02-23 DIAGNOSIS — I2510 Atherosclerotic heart disease of native coronary artery without angina pectoris: Secondary | ICD-10-CM

## 2016-02-23 DIAGNOSIS — I10 Essential (primary) hypertension: Secondary | ICD-10-CM

## 2016-02-23 DIAGNOSIS — I2583 Coronary atherosclerosis due to lipid rich plaque: Secondary | ICD-10-CM

## 2016-02-23 DIAGNOSIS — R0602 Shortness of breath: Secondary | ICD-10-CM

## 2016-02-23 MED ORDER — FUROSEMIDE 20 MG PO TABS: 20.0000 mg | ORAL_TABLET | Freq: Every day | ORAL | 3 refills | Status: DC

## 2016-02-23 MED ORDER — LISINOPRIL 20 MG PO TABS: 20.0000 mg | ORAL_TABLET | Freq: Every day | ORAL | 3 refills | Status: DC

## 2016-02-23 NOTE — Progress Notes (Signed)
Cardiology Office Note    Date:  02/24/2016  ID:  Samantha, [name] [birth year], MRN [id number] PCP:  [name], MD  Cardiologist:  Meda Coffee  Chief Complaint: f/u CP, SOB  History of Present Illness:  Samantha [name] is a 76 y.o. female with history of CAD (moderate nonobstructive disease by cardiac CT 10/2014), TIA (on Plavix), chronic diastolic CHF, DM2, HTN, HL, fibromyalgia, palpitations secondary to symptomatic PVCs, LE edema, colon cancer (s/p lap partial colectomy 10/2014), reactive airway disease who presents for follow-up of SOB and dizziness.   Per review of chart, she has a history of CP in 2016 felt MSK but nuclear stress test was abnormal. She underwent cardiac CT 10/2014 showing 50-69% in the prox and mid LAD. She went on to have unremarkable colon surgery. 2D echo 09/2014: focal basal and moderate concentric hypertrophy, EF 60-65%, grade 1 DD. She saw [name] on 07/07/15 with complaints of dizziness and SOB. This began after remodeling her home with removal of carpet and a lot of dust. EKG was unchanged from prior. BP was 124/68. [name] recommended holding hydralazine and reassessing for need for further testing. CMET 6/7 unremarkable, LDL 73. I saw her in clinic 07/25/15 at which time she was reporting similar sx. Hgb was stable and BNP was negative. CTA neg for PE or other acute disease, no change. Nuc 08/03/15 was normal.   02/23/2015 the patient has been doing well, she denies chest pain or SOB. She has been busy with her sick husband, but now has moe time for herself. No further rectal bleeding. Complaint with meds. 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  Past Medical History:  Diagnosis Date  . Abnormal CT of the chest 2008   last CT4-l 2009:  . No f/u suggested   . Allergy   . Asthma   . CAD (coronary artery disease)    a. Coronary CTA 10/16: Coronary Ca score 211, mod non-obstructive CAD with LM mild plaque (25-50%), mid LAD 50-69%. b. Neg nuc  06/2015.  . Cataract    BILATERAL-REMOVED  . Chronic diastolic CHF (congestive heart failure) ([place])   . Collagen vascular disease ([place])    "arterial sclerosis" per pt  . Complication of anesthesia    trouble waking up  . Fibromyalgia   . GERD (gastroesophageal reflux disease)   . H/O hiatal hernia   . Heart murmur   . Hyperlipidemia   . Hypertension   . Malignant neoplasm of ascending colon ([place]) 2016   Minimally invasive right hemicolectomy to be done   . Neuromuscular disorder (HCC)    FIBROMYALGIA  . Ocular migraine   . OSA (obstructive sleep apnea) 09/2007   dx w/ a sleep study, not on  CPAP  . Osteoarthritis   . Osteoporosis   . Pneumonia    "double" in 2004  . PONV (postoperative nausea and vomiting)   . Reactive airway disease 01/29/2002   dx of pseudoasthma / vcd in 2005 and nl sprirometry History of dyspnea, 2011,  improved after several medications were changed around Question of COPD, disproved July 06, 2009 with nl pft's      . Rheumatoid factor positive   . Shingles 11/2009  . Sleep apnea   . Stroke ([place])   . TIA (transient ischemic attack)    x2 - on Plavix for this  . Torn rotator cuff    right worse than left, both are torn  . Tumor, thyroid  partial thyroidectomy in the 51s  . Type II diabetes mellitus ([place])   . 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 Vaginal dysplasia     Past Surgical History:  Procedure Laterality Date  . ABDOMINAL HYSTERECTOMY  1980   NO oophorectomy per pt   . ANTERIOR CERVICAL DECOMP/DISCECTOMY FUSION  2001   C 3, C4 and C5 plate and screws  . BREAST BIOPSY Right 1999  . BUNIONECTOMY Left ~ 1977  . CATARACT EXTRACTION W/ INTRAOCULAR LENS  IMPLANT, BILATERAL  2012  . COLON SURGERY  10/2014  . EYE SURGERY Bilateral    torq lens for cataracts  . THYROIDECTOMY, PARTIAL  1960's  . VAGINAL MASS EXCISION  1994   "Laser surgery for vaginal cancer; followed by chemotherapy" (06/27/2012)    Current Medications: Current Outpatient  Prescriptions  Medication Sig Dispense Refill  . acetaminophen (TYLENOL) 500 MG tablet Take 1,000 mg by mouth every 6 (six) hours as needed for moderate pain.    [name] albuterol (PROVENTIL HFA;VENTOLIN HFA) 108 (90 Base) MCG/ACT inhaler Inhale 2 puffs into the lungs every 6 (six) hours as needed for wheezing or shortness of breath. 18 g 3  . atorvastatin (LIPITOR) 20 MG tablet TAKE 1 TABLET (20 MG TOTAL) BY MOUTH DAILY. 90 tablet 2  . butalbital-acetaminophen-caffeine (FIORICET) 50-325-40 MG tablet Take 1 tablet by mouth every 6 (six) hours as needed for headache or migraine. 14 tablet 5  . calcitonin, salmon, (MIACALCIN) 200 UNIT/ACT nasal spray Place 1 spray into alternate nostrils daily. 3.7 mL 12  . CALCIUM PO Take 1 tablet by mouth every morning.     . cetirizine (ZYRTEC) 10 MG tablet Take 1 tablet (10 mg total) by mouth daily. 90 tablet 1  . cholecalciferol (VITAMIN D) 1000 UNITS tablet Take 1,000 Units by mouth every morning.     . cholestyramine (QUESTRAN) 4 g packet Take 4 g by mouth every other day.    . clopidogrel (PLAVIX) 75 MG tablet Take 1 tablet (75 mg total) by mouth daily. 90 tablet 1  . furosemide (LASIX) 20 MG tablet Take 1 tablet (20 mg total) by mouth daily. 90 tablet 3  . HYDROcodone-acetaminophen (NORCO) 5-325 MG tablet Take 1 tablet by mouth every 4 (four) hours as needed for moderate pain. 40 tablet 0  . hydrocortisone 2.5 % cream Apply topically 2 (two) times daily. 30 g 0  . metformin (FORTAMET) 500 MG (OSM) 24 hr tablet Take 1 tablet (500 mg total) by mouth daily with breakfast. 90 tablet 1  . Multiple Vitamin (MULTIVITAMIN) capsule Take 1 capsule by mouth daily.      . nebivolol (BYSTOLIC) 10 MG tablet Take 3 tablets (30 mg total) by mouth daily. 90 tablet 9  . nitroGLYCERIN (NITROSTAT) 0.4 MG SL tablet Place 0.4 mg under the tongue every 5 (five) minutes as needed for chest pain (x 3 doses).    . ondansetron (ZOFRAN ODT) 4 MG disintegrating tablet Take 1 tablet (4 mg  total) by mouth every 8 (eight) hours as needed for nausea or vomiting. 12 tablet 1  . Polyethyl Glycol-Propyl Glycol (SYSTANE) 0.4-0.3 % GEL Place 1 drop into both eyes daily as needed (dry eyes).     . Potassium 99 MG TABS Take 99 mg by mouth daily.     . pregabalin (LYRICA) 100 MG capsule Take 1 capsule (100 mg total) by mouth as needed (FIBROMYALGIA). 30 capsule 2  . ranitidine (ZANTAC) 300 MG tablet Take 1 tablet (300 mg total) by mouth  at bedtime. 90 tablet 3  . lisinopril (PRINIVIL,ZESTRIL) 20 MG tablet Take 1 tablet (20 mg total) by mouth daily. 90 tablet 3   Current Facility-Administered Medications  Medication Dose Route Frequency Provider Last Rate Last Dose  . 0.9 %  sodium chloride infusion  500 mL Intravenous Continuous Milus Banister, MD         Allergies:   Bactrim [sulfamethoxazole-trimethoprim]; Cefuroxime axetil; Oxycodone; Pravastatin; Seldane [terfenadine]; Zocor [simvastatin]; and Tramadol   Social History   Social History  . Marital status: Married    Spouse name: Ilona Sorrel  . Number of children: 2  . Years of education: masters   Occupational History  . Retired, disable since 2000 Retired   Social History Main Topics  . Smoking status: Former Smoker    Packs/day: 0.25    Years: 5.00    Types: Cigarettes    Quit date: 01/29/1998  . Smokeless tobacco: Never Used     Comment: Quit in 2001  . Alcohol use No  . Drug use: No  . Sexual activity: No   Other Topics Concern  . None   Social History Narrative   On disability since 2000--- also husband has MS   Education. College.   Right handed.     Family History:  The patient's family history includes Allergies in her sister; Aneurysm in her brother; Asthma in her paternal grandmother and sister; Diabetes in her brother and brother; Emphysema in her brother; Heart disease in her brother, father, and mother; Kidney failure in her brother; Lung cancer in her mother; Parkinsonism in her sister; Stroke in her  brother, maternal grandmother, and paternal grandmother.  ROS:   Please see the history of present illness.  All other systems are reviewed and otherwise negative.    PHYSICAL EXAM:   VS:  BP 116/70   Pulse 70   Ht 5\' 2"  (1.575 m)   Wt 195 lb 3.2 oz (88.5 kg)   SpO2 98%   BMI 35.70 kg/m   BMI: Body mass index is 35.7 kg/m. GEN: Well nourished, well developed F, in no acute distress  HEENT: normocephalic, atraumatic Neck: no JVD, carotid bruits, or masses Cardiac: RRR; no murmurs, rubs, or gallops, no edema  Respiratory:  clear to auscultation bilaterally, normal work of breathing GI: soft, nontender, nondistended, + BS MS: no deformity or atrophy  Skin: warm and dry, no rash Neuro:  Alert and Oriented x 3, Strength and sensation are intact, follows commands Psych: euthymic mood, full affect  Wt Readings from Last 3 Encounters:  02/23/16 195 lb 3.2 oz (88.5 kg)  02/02/16 201 lb 12.8 oz (91.5 kg)  02/01/16 197 lb (89.4 kg)      Studies/Labs Reviewed:   EKG:  EKG was not ordered today  Recent Labs: 07/25/2015: Brain Natriuretic Peptide 31.8; Hemoglobin 12.9; Platelets 142; TSH 2.22 10/19/2015: ALT 18; BUN 20; Creatinine, Ser 0.93; Potassium 3.9; Sodium 141   Lipid Panel    Component Value Date/Time   CHOL 169 07/06/2015 0916   TRIG 109 07/06/2015 0916   HDL 74 07/06/2015 0916   CHOLHDL 2.3 07/06/2015 0916   VLDL 22 07/06/2015 0916   LDLCALC 73 07/06/2015 0916   LDLDIRECT 126.2 12/07/2010 1039    Additional studies/ records that were reviewed today include: Summarized above    ASSESSMENT & PLAN:   1. CP/SOB - resolved. Suspect reactive airway disease related to home renovation. If symptoms recur, would consider escalation of pulmonary regimen. 2. CAD - low risk  nuclear, she has known moderate LAD disease. She is on Plavix already for TIA. Continue statin and BB. She is motivated to exercise more. 3. Chronic diastolic CHF - remains euvolemic at this time. Lasix  PRN only. 4. Essential HTN - controlled.  5. Hyperlipidemia: on atorvastatin 20 mg po daily, well tolerated, LDL 73, TG 109 in 06/2015  6. Diabetes:  controlled. A1c 2/17 was 6.5.  FU with PCP.   7. Colon CA:  She is currently cancer free and awaiting follow-up results.   Disposition: F/u with [name] in 6-8 months or as needed.  Medication Adjustments/Labs and Tests Ordered: Current medicines are reviewed at length with the patient today.  Concerns regarding medicines are outlined above. Medication changes, Labs and Tests ordered today are summarized above and listed in the Patient Instructions accessible in Encounters.   Signed, Ena Dawley, MD 02/24/2016 6:45 AM    [address], [place], [place]  [address] Phone: [phone]; Fax: [phone]

## 2016-02-23 NOTE — Patient Instructions (Signed)
Medication Instructions:  1. A REFILL HAS BEEN SENT IN FOR LASIX  2. DECREASE LISINOPRIL TO 20 MG DAILY; NEW RX HAS BEEN SENT IN   Labwork: NONE  Testing/Procedures: NONE  Follow-Up: Your physician wants you to follow-up in: 6 MONTHS WITH DR. Johann Capers will receive a reminder letter in the mail two months in advance. If you don't receive a letter, please call our office to schedule the follow-up appointment.   Any Other Special Instructions Will Be Listed Below (If Applicable).     If you need a refill on your cardiac medications before your next appointment, please call your pharmacy.

## 2016-02-24 ENCOUNTER — Ambulatory Visit: Payer: Medicare Other | Admitting: Physical Therapy

## 2016-02-24 ENCOUNTER — Encounter: Payer: Self-pay | Admitting: Physical Therapy

## 2016-02-24 DIAGNOSIS — M6281 Muscle weakness (generalized): Secondary | ICD-10-CM

## 2016-02-24 DIAGNOSIS — R262 Difficulty in walking, not elsewhere classified: Secondary | ICD-10-CM

## 2016-02-24 DIAGNOSIS — R222 Localized swelling, mass and lump, trunk: Secondary | ICD-10-CM

## 2016-02-24 NOTE — Therapy (Signed)
[place], [place], [address] Phone: [phone]   Fax:  [phone]  Physical Therapy Treatment  Patient Details  Name: Samantha [name] MRN: [id number] Date of Birth: 1940/09/16 Referring Provider: Dr. Marcello Moores  Encounter Date: 02/24/2016      PT End of Session - 02/24/16 1147    Visit Number 9   Number of Visits 17   Date for PT Re-Evaluation 03/19/16   PT Start Time 1021   PT Stop Time 1104   PT Time Calculation (min) 43 min   Activity Tolerance Patient tolerated treatment well   Behavior During Therapy [facility] for tasks assessed/performed      Past Medical History:  Diagnosis Date  . Abnormal CT of the chest 2008   last CT4-l 2009:  . No f/u suggested   . Allergy   . Asthma   . CAD (coronary artery disease)    a. Coronary CTA 10/16: Coronary Ca score 211, mod non-obstructive CAD with LM mild plaque (25-50%), mid LAD 50-69%. b. Neg nuc 06/2015.  . Cataract    BILATERAL-REMOVED  . Chronic diastolic CHF (congestive heart failure) ([place])   . Collagen vascular disease ([place])    "arterial sclerosis" per pt  . Complication of anesthesia    trouble waking up  . Fibromyalgia   . GERD (gastroesophageal reflux disease)   . H/O hiatal hernia   . Heart murmur   . Hyperlipidemia   . Hypertension   . Malignant neoplasm of ascending colon ([place]) 2016   Minimally invasive right hemicolectomy to be done   . Neuromuscular disorder (HCC)    FIBROMYALGIA  . Ocular migraine   . OSA (obstructive sleep apnea) 09/2007   dx w/ a sleep study, not on  CPAP  . Osteoarthritis   . Osteoporosis   . Pneumonia    "double" in 2004  . PONV (postoperative nausea and vomiting)   . Reactive airway disease 01/29/2002   dx of pseudoasthma / vcd in 2005 and nl sprirometry History of dyspnea, 2011,  improved after several medications were changed around Question of COPD, disproved July 06, 2009 with nl pft's      . Rheumatoid  factor positive   . Shingles 11/2009  . Sleep apnea   . Stroke ([place])   . TIA (transient ischemic attack)    x2 - on Plavix for this  . Torn rotator cuff    right worse than left, both are torn  . Tumor, 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  . Type II diabetes mellitus ([place])   . 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 Vaginal dysplasia     Past Surgical History:  Procedure Laterality Date  . ABDOMINAL HYSTERECTOMY  1980   NO oophorectomy per pt   . ANTERIOR CERVICAL DECOMP/DISCECTOMY FUSION  2001   C 3, C4 and C5 plate and screws  . BREAST BIOPSY Right 1999  . BUNIONECTOMY Left ~ 1977  . CATARACT EXTRACTION W/ INTRAOCULAR LENS  IMPLANT, BILATERAL  2012  . COLON SURGERY  10/2014  . EYE SURGERY Bilateral    torq lens for cataracts  . THYROIDECTOMY, PARTIAL  1960's  . VAGINAL MASS EXCISION  1994   "Laser surgery for vaginal cancer; followed by chemotherapy" (06/27/2012)    There were no vitals filed for this visit.      Subjective Assessment - 02/24/16 1023    Subjective I don't have any abdominal pain today. I didn't have  any pain across my abdomen yesterday. I have been doing the exercises I got last visit.    Pertinent History type 2 diabetes, vaginal cancer, colon cancer, rheumatoid factor positive, TIA, fibromyalgia, congestive heart failure, pt reports R knee needs surgery   Patient Stated Goals to get rid of the soreness in my abdomen so I can function better   Currently in Pain? No/denies   Pain Score 0-No pain                         [facility] Adult PT Treatment/Exercise - 02/24/16 0001      Lumbar Exercises: Stretches   Single Knee to Chest Stretch 60 seconds  bilateral   Pelvic Tilt Other (comment)  10 reps with 10 sec holds, with small marches x 10 reps     Lumbar Exercises: Supine   Bridge 10 reps  3 sec holds while maintaing tight core     Knee/Hip Exercises: Prone   Hip Extension Strengthening;Both;10 reps     Manual Therapy   Myofascial  Release To anterior trunk horizontally and vertically with light pressure and to patients tolerance at surrounding area to incision and on incision                        [facility] Goals - 02/20/16 1359      CC Long Term Goal  #1   Title Pt will report a 50% improvement in abdominal pain when bending forward to allow her complete ADLs   Baseline 02/03/16- 25% improvement, 02/20/16- a little over 50% improvement   Time 4   Period Weeks   Status Achieved     CC Long Term Goal  #2   Title Pt will demonstrate R quad strength of 4/5 to decrease risk of falls    Baseline 3+/5, 02/20/16- 3+/5   Time 4   Period Weeks   Status On-going     CC Long Term Goal  #3   Title Pt will be independent in nerve desensitization technique for long term management of pain   Baseline 02/20/16- pt is independent with this   Time 4   Period Weeks   Status Achieved     CC Long Term Goal  #4   Title Pt will be independent in a home exercise program for continued strengthening   Time 4   Period Weeks   Status On-going            Plan - 02/24/16 1148    Clinical Impression Statement Today and yesterday pt has not had any abdominal pain at all. Focused this session on core and back strengthening. Patient has been performing pelvic tilts at home and states they are helping. Add prone glute strengthening today and bridges. Patient did great with all exercises and did not complain of any back pain.    Rehab Potential Good   Clinical Impairments Affecting Rehab Potential numerous comorbidities; healing L1 compression fracture    PT Frequency 2x / week   PT Duration 4 weeks   PT Treatment/Interventions ADLs/Self Care Home Management;Therapeutic exercise;Patient/family education;Manual techniques;Manual lymph drainage;Scar mobilization   PT Next Visit Plan Focus on abdominal strengthening, meeks decompression exercises, pelvic tilts with small marches, bridging, prone hip extension   PT  Home Exercise Plan [place] decompression exercises, pelvic tilts   Consulted and Agree with Plan of Care Patient      Patient will benefit from skilled therapeutic intervention  in order to improve the following deficits and impairments:  Pain, Decreased strength, Increased edema, Difficulty walking  Visit Diagnosis: Muscle weakness (generalized)  Difficulty in walking, not elsewhere classified  Localized swelling, mass and lump, trunk     Problem List Patient Active Problem List   Diagnosis Date Noted  . Spondylosis of lumbar region without myelopathy or radiculopathy 02/02/2016  . CAD in native artery 08/22/2015  . Hypertensive heart disease 07/07/2015  . Essential hypertension 07/07/2015  . SOB (shortness of breath) 07/07/2015  . Dizziness 07/07/2015  . Plantar fasciitis, left 03/22/2015  . Hx of colon cancer, stage I 01/11/2015  . Coronary artery disease due to lipid rich plaque 01/05/2015  . Chronic diastolic CHF (congestive heart failure), NYHA class 2 ([place]) 01/05/2015  . 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 Migraine (Ocular) 01/05/2014  . Gait difficulty 10/06/2013  . Pain in joint, shoulder region 11/27/2012  . VBI (vertebrobasilar insufficiency) 08/22/2012  . Right knee pain 07/29/2012  . TIA (transient ischemic attack) 06/27/2012  . Edema 02/01/2012  . DJD (degenerative joint disease) 02/02/2011  . 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 Postherpetic neuralgia ? 01/02/2010  . NECK PAIN 10/05/2009  . Palpitations 11/23/2008  . UTI'S, RECURRENT 09/28/2008  . Fibromyalgia 08/15/2007  . FATIGUE 11/19/2006  . 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 Hyperlipidemia 05/21/2006  . Reactive airway disease 01/29/2002    Allyson Sabal [facility] 02/24/2016, 11:54 AM  [place], [place], [address] Phone:  [phone]   Fax:  [phone]  Name: Samantha [name] MRN: [id number] Date of Birth: 08/06/1940  Manus Gunning, PT 02/24/16 11:55 AM

## 2016-02-27 ENCOUNTER — Ambulatory Visit: Payer: Medicare Other

## 2016-02-27 DIAGNOSIS — R222 Localized swelling, mass and lump, trunk: Secondary | ICD-10-CM | POA: Diagnosis not present

## 2016-02-27 DIAGNOSIS — R262 Difficulty in walking, not elsewhere classified: Secondary | ICD-10-CM

## 2016-02-27 DIAGNOSIS — M6281 Muscle weakness (generalized): Secondary | ICD-10-CM | POA: Diagnosis not present

## 2016-02-27 NOTE — Therapy (Signed)
[place], [place], [address] Phone: [phone]   Fax:  [phone]  Physical Therapy Treatment  Patient Details  Name: Samantha [name] MRN: [id number] Date of Birth: 12-13-1940 Referring Provider: Dr. Marcello Moores  Encounter Date: 02/27/2016      PT End of Session - 02/27/16 1205    Visit Number 10   Number of Visits 17   Date for PT Re-Evaluation 03/19/16   PT Start Time [id number]   PT Stop Time 1105   PT Time Calculation (min) 42 min   Activity Tolerance Patient tolerated treatment well   Behavior During Therapy [facility] for tasks assessed/performed      Past Medical History:  Diagnosis Date  . Abnormal CT of the chest 2008   last CT4-l 2009:  . No f/u suggested   . Allergy   . Asthma   . CAD (coronary artery disease)    a. Coronary CTA 10/16: Coronary Ca score 211, mod non-obstructive CAD with LM mild plaque (25-50%), mid LAD 50-69%. b. Neg nuc 06/2015.  . Cataract    BILATERAL-REMOVED  . Chronic diastolic CHF (congestive heart failure) ([place])   . Collagen vascular disease ([place])    "arterial sclerosis" per pt  . Complication of anesthesia    trouble waking up  . Fibromyalgia   . GERD (gastroesophageal reflux disease)   . H/O hiatal hernia   . Heart murmur   . Hyperlipidemia   . Hypertension   . Malignant neoplasm of ascending colon ([place]) 2016   Minimally invasive right hemicolectomy to be done   . Neuromuscular disorder (HCC)    FIBROMYALGIA  . Ocular migraine   . OSA (obstructive sleep apnea) 09/2007   dx w/ a sleep study, not on  CPAP  . Osteoarthritis   . Osteoporosis   . Pneumonia    "double" in 2004  . PONV (postoperative nausea and vomiting)   . Reactive airway disease 01/29/2002   dx of pseudoasthma / vcd in 2005 and nl sprirometry History of dyspnea, 2011,  improved after several medications were changed around Question of COPD, disproved July 06, 2009 with nl pft's      . Rheumatoid  factor positive   . Shingles 11/2009  . Sleep apnea   . Stroke ([place])   . TIA (transient ischemic attack)    x2 - on Plavix for this  . Torn rotator cuff    right worse than left, both are torn  . Tumor, 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  . Type II diabetes mellitus ([place])   . 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 Vaginal dysplasia     Past Surgical History:  Procedure Laterality Date  . ABDOMINAL HYSTERECTOMY  1980   NO oophorectomy per pt   . ANTERIOR CERVICAL DECOMP/DISCECTOMY FUSION  2001   C 3, C4 and C5 plate and screws  . BREAST BIOPSY Right 1999  . BUNIONECTOMY Left ~ 1977  . CATARACT EXTRACTION W/ INTRAOCULAR LENS  IMPLANT, BILATERAL  2012  . COLON SURGERY  10/2014  . EYE SURGERY Bilateral    torq lens for cataracts  . THYROIDECTOMY, PARTIAL  1960's  . VAGINAL MASS EXCISION  1994   "Laser surgery for vaginal cancer; followed by chemotherapy" (06/27/2012)    There were no vitals filed for this visit.      Subjective Assessment - 02/27/16 1025    Subjective My sciatic feels like it's flared up today. My Rt  leg pain woke me up last night. I see the orthopedist for my Rt knee and maybe my back on Wednesday. My abdominal area is doing great though! I know my Rt knee pain is affecting my hip/sciatic pain.    Pertinent History type 2 diabetes, vaginal cancer, colon cancer, rheumatoid factor positive, TIA, fibromyalgia, congestive heart failure, pt reports R knee needs surgery   Patient Stated Goals to get rid of the soreness in my abdomen so I can function better   Currently in Pain? Yes   Pain Score 8    Pain Location Leg   Pain Orientation Right   Pain Descriptors / Indicators Sharp   Pain Type Chronic pain   Pain Radiating Towards radiating from sciatic area   Pain Onset More than a month ago   Pain Frequency Constant   Aggravating Factors  my sciatic area has just been really flared up lately   Pain Relieving Factors tylenol will sometimes help a little, but I  take hydrocotisone when it gets bad enough                         [facility] Adult PT Treatment/Exercise - 02/27/16 0001      Lumbar Exercises: Stretches   Passive Hamstring Stretch 3 reps;20 seconds  Rt LE seated EOB and in supine with towel   Passive Hamstring Stretch Limitations Pt returned correct therapist demonstration   Single Knee to Chest Stretch 2 reps;20 seconds  Bil, used towel for Rt LE due to knee pain   Pelvic Tilt Other (comment)  10x10" holds; then with marching 10x   Piriformis Stretch 3 reps;20 seconds  Figure 4 position seated EOB and in supine   Piriformis Stretch Limitations Pt returned correct therapist demonstration     Manual Therapy   Myofascial Release To anterior trunk horizontally and vertically with light pressure and to patients tolerance at surrounding area to incision and on incision                PT Education - 02/27/16 1105    Education provided Yes   Education Details Cont supine single knee to chest with strtch but with towel for Rt LE and incorporate seated Rt LE hamstring and piriformis stretches   Person(s) Educated Patient   Methods Explanation;Demonstration   Comprehension Verbalized understanding;Returned demonstration                [facility] Goals - 02/20/16 1359      CC Long Term Goal  #1   Title Pt will report a 50% improvement in abdominal pain when bending forward to allow her complete ADLs   Baseline 02/03/16- 25% improvement, 02/20/16- a little over 50% improvement   Time 4   Period Weeks   Status Achieved     CC Long Term Goal  #2   Title Pt will demonstrate R quad strength of 4/5 to decrease risk of falls    Baseline 3+/5, 02/20/16- 3+/5   Time 4   Period Weeks   Status On-going     CC Long Term Goal  #3   Title Pt will be independent in nerve desensitization technique for long term management of pain   Baseline 02/20/16- pt is independent with this   Time 4   Period Weeks   Status  Achieved     CC Long Term Goal  #4   Title Pt will be independent in a home exercise program for continued  strengthening   Time 4   Period Weeks   Status On-going            Plan - 02/27/16 1207    Clinical Impression Statement Pt continues with no abdominal pain but sciatic pain discomfort as increased to include her Rt LE. Her Rt knee is primary issue at this time and she reports feeling like this is flaring up her Rt hip pain and sciatic. Focused on low back stretches to help alleviate low back and Rt LE pain and included abdominal strength per last PT note/plan. Pt reported pain was slightly improved at end of session. She has appointment with orthopedic doctor, Dr. Rosendo Gros, on Wednesday to discuss her Rt knee and possibly her low back pain as well.    Rehab Potential Good   Clinical Impairments Affecting Rehab Potential numerous comorbidities; healing L1 compression fracture    PT Treatment/Interventions ADLs/Self Care Home Management;Therapeutic exercise;Patient/family education;Manual techniques;Manual lymph drainage;Scar mobilization   PT Next Visit Plan Cont to focus on abdominal strengthening, meeks decompression exercises, pelvic tilts with small marches, bridging, prone hip extension (pt did not tolerate today) and Rt LE flexibility.   Consulted and Agree with Plan of Care Patient      Patient will benefit from skilled therapeutic intervention in order to improve the following deficits and impairments:  Pain, Decreased strength, Increased edema, Difficulty walking  Visit Diagnosis: Muscle weakness (generalized)  Difficulty in walking, not elsewhere classified  Localized swelling, mass and lump, trunk     Problem List Patient Active Problem List   Diagnosis Date Noted  . Spondylosis of lumbar region without myelopathy or radiculopathy 02/02/2016  . CAD in native artery 08/22/2015  . Hypertensive heart disease 07/07/2015  . Essential hypertension 07/07/2015  . SOB  (shortness of breath) 07/07/2015  . Dizziness 07/07/2015  . Plantar fasciitis, left 03/22/2015  . Hx of colon cancer, stage I 01/11/2015  . Coronary artery disease due to lipid rich plaque 01/05/2015  . Chronic diastolic CHF (congestive heart failure), NYHA class 2 ([place]) 01/05/2015  . 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 Migraine (Ocular) 01/05/2014  . Gait difficulty 10/06/2013  . Pain in joint, shoulder region 11/27/2012  . VBI (vertebrobasilar insufficiency) 08/22/2012  . Right knee pain 07/29/2012  . TIA (transient ischemic attack) 06/27/2012  . Edema 02/01/2012  . DJD (degenerative joint disease) 02/02/2011  . 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 Postherpetic neuralgia ? 01/02/2010  . NECK PAIN 10/05/2009  . Palpitations 11/23/2008  . UTI'S, RECURRENT 09/28/2008  . Fibromyalgia 08/15/2007  . FATIGUE 11/19/2006  . 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 Hyperlipidemia 05/21/2006  . Reactive airway disease 01/29/2002    Otelia Limes, PTA 02/27/2016, 12:19 PM  [place], [place], [address] Phone: [phone]   Fax:  [phone]  Name: Samantha [name] MRN: [id number] Date of Birth: 1940-05-17

## 2016-02-29 ENCOUNTER — Encounter (INDEPENDENT_AMBULATORY_CARE_PROVIDER_SITE_OTHER): Payer: Self-pay | Admitting: Orthopaedic Surgery

## 2016-02-29 ENCOUNTER — Ambulatory Visit (INDEPENDENT_AMBULATORY_CARE_PROVIDER_SITE_OTHER): Payer: Medicare Other

## 2016-02-29 ENCOUNTER — Ambulatory Visit (INDEPENDENT_AMBULATORY_CARE_PROVIDER_SITE_OTHER): Payer: Medicare Other | Admitting: Orthopaedic Surgery

## 2016-02-29 VITALS — BP 153/78 | HR 60 | Ht 63.5 in | Wt 201.0 lb

## 2016-02-29 DIAGNOSIS — M5441 Lumbago with sciatica, right side: Secondary | ICD-10-CM | POA: Diagnosis not present

## 2016-02-29 DIAGNOSIS — I2583 Coronary atherosclerosis due to lipid rich plaque: Secondary | ICD-10-CM | POA: Diagnosis not present

## 2016-02-29 DIAGNOSIS — G8929 Other chronic pain: Secondary | ICD-10-CM

## 2016-02-29 DIAGNOSIS — M25561 Pain in right knee: Secondary | ICD-10-CM

## 2016-02-29 DIAGNOSIS — I2510 Atherosclerotic heart disease of native coronary artery without angina pectoris: Secondary | ICD-10-CM

## 2016-02-29 NOTE — Progress Notes (Signed)
Office Visit Note   Patient: Samantha [name]           Date of Birth: 28-Oct-1940           MRN: [id number] Visit Date: 02/29/2016              Requested by: [address], [place], [place] [address] PCP: [name], MD   Assessment & Plan: Visit Diagnoses:  1. Right-sided low back pain with right-sided sciatica, unspecified chronicity   2. Chronic pain of right knee     Plan: Patient with multiple medical problems including diabetes, lumbar disc degeneration, history of TIA. She has progressive right knee valgus deformity with severe lateral compartment arthritis. She is getting progressive deformity pain. We reviewed the x-rays discussed knee arthritis. Immature assisted devices discussed use of ice and elevation. She's had some stomach problems in the past and does not tolerate anti-inflammatories well. I will recheck her in 4 weeks we can discuss this further.  Follow-Up Instructions: Return in about 4 weeks (around 03/28/2016).   Orders:  Orders Placed This Encounter  Procedures  . XR Lumbar Spine 2-3 Views  . XR Knee 1-2 Views Right   No orders of the defined types were placed in this encounter.     Procedures: No procedures performed   Clinical Data: No additional findings.   Subjective: Chief Complaint  Patient presents with  . Lower Back - Pain  . Right Knee - Pain    Patient presents today with low back pain and right knee pain. She was seen at Urgent Care on 01/20/2016 and diagnosed with L1-2 compression fracture after x-rays. She has been to her PCP, Dr. Acey Lav who is sending patient to physical therapy for core strengthening. She states this is helping. Her back seems to be getting better. She does complain with pain that travels from her back to just below her right knee. She also states that the knee is "beginning to cave in" and gives out. She would like this checked as well.    Previous x-rays reviewed from 01/20/2016  and radiologist report reviewed. X-rays obtained today results included in this note. She does not have a lumbar compression fracture but does have considerable osteoarthritis and disc degeneration with L4-5 anterolisthesis degenerative in nature. Review of Systems 14 point review of systems obtained. Past history of colon cancer 2016. Coronary artery disease, calcification of the abdominal aorta PAD. History of UTIs, history of TIA, diabetes on oral medications. Knee pain with progressive valgus deformity over the last 2 years.   Objective: Vital Signs: BP (!) 153/78   Pulse 60   Ht 5' 3.5" (1.613 m)   Wt 201 lb (91.2 kg)   BMI 35.05 kg/m   Physical Exam  Constitutional: She is oriented to person, place, and time. She appears well-developed.  HENT:  Head: Normocephalic.  Right Ear: External ear normal.  Left Ear: External ear normal.  Eyes: Pupils are equal, round, and reactive to light.  Neck: No tracheal deviation present. No thyromegaly present.  Cardiovascular: Normal rate.   Pulmonary/Chest: Effort normal.  Abdominal: Soft.  Musculoskeletal:  Pelvis is level standing position she has some mild sciatic notch tenderness on the right. Hip range of motion is normal. Right knee has valgus deformity of about 25 clinically. Her foot is warm she has palpable pulses. Knee and ankle jerk are 2+ and symmetrical. Anterior tib EHL gastrocsoleus is active and strong symmetrically. No rash over  exposed skin.  Neurological: She is alert and oriented to person, place, and time.  Skin: Skin is warm and dry.  Psychiatric: She has a normal mood and affect. Her behavior is normal.   left reaches full extension and flexes to 110. Right knee lacks 10 reaching full extension and flexes to 100. Severe crepitus with range of motion and laxity laterally due to lateral compartment wear. Quad mechanism is intact severe patellofemoral crepitus. 2+ effusion noted.  Ortho Exam  Specialty Comments:  No  specialty comments available.  Imaging: No results found.   PMFS History: Patient Active Problem List   Diagnosis Date Noted  . Spondylosis of lumbar region without myelopathy or radiculopathy 02/02/2016  . CAD in native artery 08/22/2015  . Hypertensive heart disease 07/07/2015  . Essential hypertension 07/07/2015  . SOB (shortness of breath) 07/07/2015  . Dizziness 07/07/2015  . Plantar fasciitis, left 03/22/2015  . Hx of colon cancer, stage I 01/11/2015  . Coronary artery disease due to lipid rich plaque 01/05/2015  . Chronic diastolic CHF (congestive heart failure), NYHA class 2 ([place]) 01/05/2015  . 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 Migraine (Ocular) 01/05/2014  . Gait difficulty 10/06/2013  . Pain in joint, shoulder region 11/27/2012  . VBI (vertebrobasilar insufficiency) 08/22/2012  . Right knee pain 07/29/2012  . TIA (transient ischemic attack) 06/27/2012  . Edema 02/01/2012  . DJD (degenerative joint disease) 02/02/2011  . 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 Postherpetic neuralgia ? 01/02/2010  . NECK PAIN 10/05/2009  . Palpitations 11/23/2008  . UTI'S, RECURRENT 09/28/2008  . Fibromyalgia 08/15/2007  . FATIGUE 11/19/2006  . 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 Hyperlipidemia 05/21/2006  . Reactive airway disease 01/29/2002   Past Medical History:  Diagnosis Date  . Abnormal CT of the chest 2008   last CT4-l 2009:  . No f/u suggested   . Allergy   . Asthma   . CAD (coronary artery disease)    a. Coronary CTA 10/16: Coronary Ca score 211, mod non-obstructive CAD with LM mild plaque (25-50%), mid LAD 50-69%. b. Neg nuc 06/2015.  . Cataract    BILATERAL-REMOVED  . Chronic diastolic CHF (congestive heart failure) ([place])   . Collagen vascular disease ([place])    "arterial sclerosis" per pt  . Complication of anesthesia    trouble waking up  . Fibromyalgia   . GERD  (gastroesophageal reflux disease)   . H/O hiatal hernia   . Heart murmur   . Hyperlipidemia   . Hypertension   . Malignant neoplasm of ascending colon ([place]) 2016   Minimally invasive right hemicolectomy to be done   . Neuromuscular disorder (HCC)    FIBROMYALGIA  . Ocular migraine   . OSA (obstructive sleep apnea) 09/2007   dx w/ a sleep study, not on  CPAP  . Osteoarthritis   . Osteoporosis   . Pneumonia    "double" in 2004  . PONV (postoperative nausea and vomiting)   . Reactive airway disease 01/29/2002   dx of pseudoasthma / vcd in 2005 and nl sprirometry History of dyspnea, 2011,  improved after several medications were changed around Question of COPD, disproved July 06, 2009 with nl pft's      . Rheumatoid factor positive   . Shingles 11/2009  . Sleep apnea   . Stroke ([employer])   . TIA (transient ischemic attack)    x2 - on  Plavix for this  . Torn rotator cuff    right worse than left, both are torn  . Tumor, 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  . Type II diabetes mellitus ([place])   . 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 Vaginal dysplasia     Family History  Problem Relation Age of Onset  . Heart disease Father   . Heart disease Mother   . Lung cancer Mother   . Allergies Sister   . Parkinsonism Sister     possible  . Asthma Sister   . Asthma Paternal Grandmother   . Stroke Paternal Grandmother   . Heart disease      paternal grandparents, maternal grandparents,   . Heart disease Brother   . Emphysema Brother   . Aneurysm Brother     x3  . Kidney failure Brother   . Diabetes Brother   . Diabetes Brother   . Stroke Brother   . Stroke Maternal Grandmother   . Breast cancer Neg Hx   . Colon cancer Neg Hx   . Heart attack Neg Hx     Past Surgical History:  Procedure Laterality Date  . ABDOMINAL HYSTERECTOMY  1980   NO oophorectomy per pt   . ANTERIOR CERVICAL DECOMP/DISCECTOMY FUSION  2001   C 3, C4 and C5 plate and screws  . BREAST BIOPSY Right 1999  .  BUNIONECTOMY Left ~ 1977  . CATARACT EXTRACTION W/ INTRAOCULAR LENS  IMPLANT, BILATERAL  2012  . COLON SURGERY  10/2014  . EYE SURGERY Bilateral    torq lens for cataracts  . THYROIDECTOMY, PARTIAL  1960's  . VAGINAL MASS EXCISION  1994   "Laser surgery for vaginal cancer; followed by chemotherapy" (06/27/2012)   Social History   Occupational History  . Retired, disable since 2000 Retired   Social History Main Topics  . Smoking status: Former Smoker    Packs/day: 0.25    Years: 5.00    Types: Cigarettes    Quit date: 01/29/1998  . Smokeless tobacco: Never Used     Comment: Quit in 2001  . Alcohol use No  . Drug use: No  . Sexual activity: No

## 2016-03-01 ENCOUNTER — Encounter: Payer: Self-pay | Admitting: Internal Medicine

## 2016-03-01 ENCOUNTER — Ambulatory Visit: Payer: Medicare Other | Attending: General Surgery

## 2016-03-01 DIAGNOSIS — M6281 Muscle weakness (generalized): Secondary | ICD-10-CM | POA: Diagnosis not present

## 2016-03-01 DIAGNOSIS — R262 Difficulty in walking, not elsewhere classified: Secondary | ICD-10-CM | POA: Diagnosis not present

## 2016-03-01 DIAGNOSIS — R222 Localized swelling, mass and lump, trunk: Secondary | ICD-10-CM | POA: Diagnosis not present

## 2016-03-01 NOTE — Therapy (Signed)
[place], [place], [address] Phone: [phone]   Fax:  [phone]  Physical Therapy Treatment  Patient Details  Name: Samantha [name] MRN: [id number] Date of Birth: [birth year] Referring Provider: Dr. Marcello Moores  Encounter Date: 03/01/2016      PT End of Session - 03/01/16 1154    Visit Number 11   Number of Visits 17   Date for PT Re-Evaluation 03/19/16   PT Start Time 1110   PT Stop Time 1154   PT Time Calculation (min) 44 min   Activity Tolerance Patient tolerated treatment well   Behavior During Therapy [facility] for tasks assessed/performed      Past Medical History:  Diagnosis Date  . Abnormal CT of the chest 2008   last CT4-l 2009:  . No f/u suggested   . Allergy   . Asthma   . CAD (coronary artery disease)    a. Coronary CTA 10/16: Coronary Ca score 211, mod non-obstructive CAD with LM mild plaque (25-50%), mid LAD 50-69%. b. Neg nuc 06/2015.  . Cataract    BILATERAL-REMOVED  . Chronic diastolic CHF (congestive heart failure) ([place])   . Collagen vascular disease ([place])    "arterial sclerosis" per pt  . Complication of anesthesia    trouble waking up  . Fibromyalgia   . GERD (gastroesophageal reflux disease)   . H/O hiatal hernia   . Heart murmur   . Hyperlipidemia   . Hypertension   . Malignant neoplasm of ascending colon ([place]) 2016   Minimally invasive right hemicolectomy to be done   . Neuromuscular disorder (HCC)    FIBROMYALGIA  . Ocular migraine   . OSA (obstructive sleep apnea) 09/2007   dx w/ a sleep study, not on  CPAP  . Osteoarthritis   . Osteoporosis   . Pneumonia    "double" in 2004  . PONV (postoperative nausea and vomiting)   . Reactive airway disease 01/29/2002   dx of pseudoasthma / vcd in 2005 and nl sprirometry History of dyspnea, 2011,  improved after several medications were changed around Question of COPD, disproved July 06, 2009 with nl pft's      . Rheumatoid  factor positive   . Shingles 11/2009  . Sleep apnea   . Stroke ([place])   . TIA (transient ischemic attack)    x2 - on Plavix for this  . Torn rotator cuff    right worse than left, both are torn  . Tumor, 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  . Type II diabetes mellitus ([place])   . 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 Vaginal dysplasia     Past Surgical History:  Procedure Laterality Date  . ABDOMINAL HYSTERECTOMY  1980   NO oophorectomy per pt   . ANTERIOR CERVICAL DECOMP/DISCECTOMY FUSION  2001   C 3, C4 and C5 plate and screws  . BREAST BIOPSY Right 1999  . BUNIONECTOMY Left ~ 1977  . CATARACT EXTRACTION W/ INTRAOCULAR LENS  IMPLANT, BILATERAL  2012  . COLON SURGERY  10/2014  . EYE SURGERY Bilateral    torq lens for cataracts  . THYROIDECTOMY, PARTIAL  1960's  . VAGINAL MASS EXCISION  1994   "Laser surgery for vaginal cancer; followed by chemotherapy" (06/27/2012)    There were no vitals filed for this visit.      Subjective Assessment - 03/01/16 1114    Subjective I saw Dr. Thurmond Butts yesterday and he gave me  2 cortisone shots but said I'd need to come back in 4 weeks to discuss my need for a TKR. I already feel a whole better from the shots. And my back feels all better today too, I think it was all from the knee. He said he didn't see any fracture at my L1 or any evidence of one being there.    Pertinent History type 2 diabetes, vaginal cancer, colon cancer, rheumatoid factor positive, TIA, fibromyalgia, congestive heart failure, pt reports R knee needs surgery   Patient Stated Goals to get rid of the soreness in my abdomen so I can function better   Currently in Pain? No/denies                         [facility] Adult PT Treatment/Exercise - 03/01/16 0001      Lumbar Exercises: Stretches   Single Knee to Chest Stretch 3 reps;20 seconds  Bil, using towel to assist with pulling LE   Pelvic Tilt Other (comment)  10x10" holds, then marching 10x, and then  heel slide 10x     Lumbar Exercises: Supine   Other Supine Lumbar Exercises All Meeks Decompression exercises 5 times for 5 second holds.                        [facility] Goals - 02/20/16 1359      CC Long Term Goal  #1   Title Pt will report a 50% improvement in abdominal pain when bending forward to allow her complete ADLs   Baseline 02/03/16- 25% improvement, 02/20/16- a little over 50% improvement   Time 4   Period Weeks   Status Achieved     CC Long Term Goal  #2   Title Pt will demonstrate R quad strength of 4/5 to decrease risk of falls    Baseline 3+/5, 02/20/16- 3+/5   Time 4   Period Weeks   Status On-going     CC Long Term Goal  #3   Title Pt will be independent in nerve desensitization technique for long term management of pain   Baseline 02/20/16- pt is independent with this   Time 4   Period Weeks   Status Achieved     CC Long Term Goal  #4   Title Pt will be independent in a home exercise program for continued strengthening   Time 4   Period Weeks   Status On-going            Plan - 03/01/16 1155    Clinical Impression Statement Pt reported her low back and Rt knee feling much better today but still wanted to take it easy with exercising as Dr. Rosendo Gros instructed her to take it easy on her knee until she sees him again in 4 weeks. She tolerated exercises very well with no c/o increase pain. Did not want to do any abdominal myofascial release as she reported she had some soreness after last visit, so did not do this today. She had c/o dizziness when she sat up today but it went away after a few minutes.    Rehab Potential Good   Clinical Impairments Affecting Rehab Potential numerous comorbidities; healing L1 compression fracture    PT Frequency 2x / week   PT Duration 4 weeks   PT Treatment/Interventions ADLs/Self Care Home Management;Therapeutic exercise;Patient/family education;Manual techniques;Manual lymph drainage;Scar mobilization    PT Next Visit Plan Cont to focus on abdominal  strengthening, meeks decompression exercises, pelvic tilts with small marches, bridging, prone hip extension and Rt LE flexibility.   Consulted and Agree with Plan of Care Patient      Patient will benefit from skilled therapeutic intervention in order to improve the following deficits and impairments:  Pain, Decreased strength, Increased edema, Difficulty walking  Visit Diagnosis: Muscle weakness (generalized)  Difficulty in walking, not elsewhere classified     Problem List Patient Active Problem List   Diagnosis Date Noted  . Spondylosis of lumbar region without myelopathy or radiculopathy 02/02/2016  . CAD in native artery 08/22/2015  . Hypertensive heart disease 07/07/2015  . Essential hypertension 07/07/2015  . SOB (shortness of breath) 07/07/2015  . Dizziness 07/07/2015  . Plantar fasciitis, left 03/22/2015  . Hx of colon cancer, stage I 01/11/2015  . Coronary artery disease due to lipid rich plaque 01/05/2015  . Chronic diastolic CHF (congestive heart failure), NYHA class 2 ([place]) 01/05/2015  . 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 Migraine (Ocular) 01/05/2014  . Gait difficulty 10/06/2013  . Pain in joint, shoulder region 11/27/2012  . VBI (vertebrobasilar insufficiency) 08/22/2012  . Right knee pain 07/29/2012  . TIA (transient ischemic attack) 06/27/2012  . Edema 02/01/2012  . DJD (degenerative joint disease) 02/02/2011  . 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 Postherpetic neuralgia ? 01/02/2010  . NECK PAIN 10/05/2009  . Palpitations 11/23/2008  . UTI'S, RECURRENT 09/28/2008  . Fibromyalgia 08/15/2007  . FATIGUE 11/19/2006  . 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 Hyperlipidemia 05/21/2006  . Reactive airway disease 01/29/2002    Otelia Limes, PTA 03/01/2016, 11:59 AM  [place], [place], [address] Phone: [phone]   Fax:  [phone]  Name: Samantha [name] MRN: [id number] Date of Birth: [birth year]

## 2016-03-05 ENCOUNTER — Encounter: Payer: Self-pay | Admitting: Physical Therapy

## 2016-03-05 ENCOUNTER — Ambulatory Visit: Payer: Medicare Other | Admitting: Physical Therapy

## 2016-03-05 DIAGNOSIS — M6281 Muscle weakness (generalized): Secondary | ICD-10-CM | POA: Diagnosis not present

## 2016-03-05 DIAGNOSIS — R262 Difficulty in walking, not elsewhere classified: Secondary | ICD-10-CM | POA: Diagnosis not present

## 2016-03-05 DIAGNOSIS — R222 Localized swelling, mass and lump, trunk: Secondary | ICD-10-CM | POA: Diagnosis not present

## 2016-03-05 NOTE — Therapy (Signed)
[place], [place], [address] Phone: [phone]   Fax:  [phone]  Physical Therapy Treatment  Patient Details  Name: Samantha [name] MRN: [id number] Date of Birth: [birth year] Referring Provider: Dr. Marcello Moores  Encounter Date: 03/05/2016      PT End of Session - 03/05/16 1703    Visit Number 12   Number of Visits 17   Date for PT Re-Evaluation 03/19/16   PT Start Time [id number]  pt arrived late   PT Stop Time 1603   PT Time Calculation (min) 37 min   Activity Tolerance Patient tolerated treatment well   Behavior During Therapy [facility] for tasks assessed/performed      Past Medical History:  Diagnosis Date  . Abnormal CT of the chest 2008   last CT4-l 2009:  . No f/u suggested   . Allergy   . Asthma   . CAD (coronary artery disease)    a. Coronary CTA 10/16: Coronary Ca score 211, mod non-obstructive CAD with LM mild plaque (25-50%), mid LAD 50-69%. b. Neg nuc 06/2015.  . Cataract    BILATERAL-REMOVED  . Chronic diastolic CHF (congestive heart failure) ([place])   . Collagen vascular disease ([place])    "arterial sclerosis" per pt  . Complication of anesthesia    trouble waking up  . Fibromyalgia   . GERD (gastroesophageal reflux disease)   . H/O hiatal hernia   . Heart murmur   . Hyperlipidemia   . Hypertension   . Malignant neoplasm of ascending colon ([place]) 2016   Minimally invasive right hemicolectomy to be done   . Neuromuscular disorder (HCC)    FIBROMYALGIA  . Ocular migraine   . OSA (obstructive sleep apnea) 09/2007   dx w/ a sleep study, not on  CPAP  . Osteoarthritis   . Osteoporosis   . Pneumonia    "double" in 2004  . PONV (postoperative nausea and vomiting)   . Reactive airway disease 01/29/2002   dx of pseudoasthma / vcd in 2005 and nl sprirometry History of dyspnea, 2011,  improved after several medications were changed around Question of COPD, disproved July 06, 2009 with nl pft's       . Rheumatoid factor positive   . Shingles 11/2009  . Sleep apnea   . Stroke ([place])   . TIA (transient ischemic attack)    x2 - on Plavix for this  . Torn rotator cuff    right worse than left, both are torn  . Tumor, 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  . Type II diabetes mellitus ([place])   . 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 Vaginal dysplasia     Past Surgical History:  Procedure Laterality Date  . ABDOMINAL HYSTERECTOMY  1980   NO oophorectomy per pt   . ANTERIOR CERVICAL DECOMP/DISCECTOMY FUSION  2001   C 3, C4 and C5 plate and screws  . BREAST BIOPSY Right 1999  . BUNIONECTOMY Left ~ 1977  . CATARACT EXTRACTION W/ INTRAOCULAR LENS  IMPLANT, BILATERAL  2012  . COLON SURGERY  10/2014  . EYE SURGERY Bilateral    torq lens for cataracts  . THYROIDECTOMY, PARTIAL  1960's  . VAGINAL MASS EXCISION  1994   "Laser surgery for vaginal cancer; followed by chemotherapy" (06/27/2012)    There were no vitals filed for this visit.      Subjective Assessment - 03/05/16 1528    Subjective My abdomen pain is very good.  I haven't felt any pain over the weekend. I tried not to be on my right knee too much.    Pertinent History type 2 diabetes, vaginal cancer, colon cancer, rheumatoid factor positive, TIA, fibromyalgia, congestive heart failure, pt reports R knee needs surgery   Patient Stated Goals to get rid of the soreness in my abdomen so I can function better   Currently in Pain? No/denies   Pain Score 0-No pain                         [facility] Adult PT Treatment/Exercise - 03/05/16 0001      Lumbar Exercises: Stretches   Single Knee to Chest Stretch 3 reps;20 seconds  Bil, using towel to assist with pulling LE   Pelvic Tilt Other (comment)  10x10" holds, then marching 10x, and then heel slide 10x   Piriformis Stretch 20 seconds;4 reps  Figure 4 position seated EOB and in supine     Lumbar Exercises: Supine   Other Supine Lumbar Exercises All Meeks  Decompression exercises 5 times for 5 second holds.                        [facility] Goals - 02/20/16 1359      CC Long Term Goal  #1   Title Pt will report a 50% improvement in abdominal pain when bending forward to allow her complete ADLs   Baseline 02/03/16- 25% improvement, 02/20/16- a little over 50% improvement   Time 4   Period Weeks   Status Achieved     CC Long Term Goal  #2   Title Pt will demonstrate R quad strength of 4/5 to decrease risk of falls    Baseline 3+/5, 02/20/16- 3+/5   Time 4   Period Weeks   Status On-going     CC Long Term Goal  #3   Title Pt will be independent in nerve desensitization technique for long term management of pain   Baseline 02/20/16- pt is independent with this   Time 4   Period Weeks   Status Achieved     CC Long Term Goal  #4   Title Pt will be independent in a home exercise program for continued strengthening   Time 4   Period Weeks   Status On-going            Plan - 03/05/16 1703    Clinical Impression Statement Pt reported her low back and right knee continue to feel better and she is having less pain. She feels that the exercises are really helping. Continued with decompression and core strengthening exercises today. No myofascial performed today due to lack of pain.    Rehab Potential Good   Clinical Impairments Affecting Rehab Potential numerous comorbidities; healing L1 compression fracture    PT Frequency 2x / week   PT Duration 4 weeks   PT Treatment/Interventions ADLs/Self Care Home Management;Therapeutic exercise;Patient/family education;Manual techniques;Manual lymph drainage;Scar mobilization   PT Next Visit Plan Cont to focus on abdominal strengthening, meeks decompression exercises, pelvic tilts with small marches, bridging, prone hip extension and Rt LE flexibility.   PT Home Exercise Plan Meeks decompression exercises, pelvic tilts   Consulted and Agree with Plan of Care Patient       Patient will benefit from skilled therapeutic intervention in order to improve the following deficits and impairments:  Pain, Decreased strength, Increased edema, Difficulty walking  Visit Diagnosis: Muscle  weakness (generalized)  Difficulty in walking, not elsewhere classified     Problem List Patient Active Problem List   Diagnosis Date Noted  . Spondylosis of lumbar region without myelopathy or radiculopathy 02/02/2016  . CAD in native artery 08/22/2015  . Hypertensive heart disease 07/07/2015  . Essential hypertension 07/07/2015  . SOB (shortness of breath) 07/07/2015  . Dizziness 07/07/2015  . Plantar fasciitis, left 03/22/2015  . Hx of colon cancer, stage I 01/11/2015  . Coronary artery disease due to lipid rich plaque 01/05/2015  . Chronic diastolic CHF (congestive heart failure), NYHA class 2 ([place]) 01/05/2015  . 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 Migraine (Ocular) 01/05/2014  . Gait difficulty 10/06/2013  . Pain in joint, shoulder region 11/27/2012  . VBI (vertebrobasilar insufficiency) 08/22/2012  . Right knee pain 07/29/2012  . TIA (transient ischemic attack) 06/27/2012  . Edema 02/01/2012  . DJD (degenerative joint disease) 02/02/2011  . 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 Postherpetic neuralgia ? 01/02/2010  . NECK PAIN 10/05/2009  . Palpitations 11/23/2008  . UTI'S, RECURRENT 09/28/2008  . Fibromyalgia 08/15/2007  . FATIGUE 11/19/2006  . 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 Hyperlipidemia 05/21/2006  . Reactive airway disease 01/29/2002    Allyson Sabal [facility] 03/05/2016, 5:05 PM  [place], [place], [address] Phone: [phone]   Fax:  [phone]  Name: Samantha [name] MRN: [id number] Date of Birth: Aug 23, 1940  Manus Gunning, PT 03/05/16 5:06 PM

## 2016-03-08 ENCOUNTER — Encounter: Payer: Self-pay | Admitting: Physical Therapy

## 2016-03-08 ENCOUNTER — Ambulatory Visit: Payer: Medicare Other | Admitting: Physical Therapy

## 2016-03-08 DIAGNOSIS — M6281 Muscle weakness (generalized): Secondary | ICD-10-CM | POA: Diagnosis not present

## 2016-03-08 DIAGNOSIS — R262 Difficulty in walking, not elsewhere classified: Secondary | ICD-10-CM

## 2016-03-08 DIAGNOSIS — R222 Localized swelling, mass and lump, trunk: Secondary | ICD-10-CM | POA: Diagnosis not present

## 2016-03-08 NOTE — Therapy (Signed)
[place], [place], [address] Phone: [phone]   Fax:  [phone]  Physical Therapy Treatment  Patient Details  Name: Samantha [name] MRN: [id number] Date of Birth: [birth year] Referring Provider: Dr. Marcello Moores  Encounter Date: 03/08/2016      PT End of Session - 03/08/16 0902    Visit Number 13   Number of Visits 17   Date for PT Re-Evaluation 03/19/16   PT Start Time [id number]   PT Stop Time 0932   PT Time Calculation (min) 35 min   Activity Tolerance Patient tolerated treatment well   Behavior During Therapy [facility] for tasks assessed/performed      Past Medical History:  Diagnosis Date  . Abnormal CT of the chest 2008   last CT4-l 2009:  . No f/u suggested   . Allergy   . Asthma   . CAD (coronary artery disease)    a. Coronary CTA 10/16: Coronary Ca score 211, mod non-obstructive CAD with LM mild plaque (25-50%), mid LAD 50-69%. b. Neg nuc 06/2015.  . Cataract    BILATERAL-REMOVED  . Chronic diastolic CHF (congestive heart failure) ([place])   . Collagen vascular disease ([place])    "arterial sclerosis" per pt  . Complication of anesthesia    trouble waking up  . Fibromyalgia   . GERD (gastroesophageal reflux disease)   . H/O hiatal hernia   . Heart murmur   . Hyperlipidemia   . Hypertension   . Malignant neoplasm of ascending colon ([place]) 2016   Minimally invasive right hemicolectomy to be done   . Neuromuscular disorder (HCC)    FIBROMYALGIA  . Ocular migraine   . OSA (obstructive sleep apnea) 09/2007   dx w/ a sleep study, not on  CPAP  . Osteoarthritis   . Osteoporosis   . Pneumonia    "double" in 2004  . PONV (postoperative nausea and vomiting)   . Reactive airway disease 01/29/2002   dx of pseudoasthma / vcd in 2005 and nl sprirometry History of dyspnea, 2011,  improved after several medications were changed around Question of COPD, disproved July 06, 2009 with nl pft's      . Rheumatoid  factor positive   . Shingles 11/2009  . Sleep apnea   . Stroke ([place])   . TIA (transient ischemic attack)    x2 - on Plavix for this  . Torn rotator cuff    right worse than left, both are torn  . Tumor, 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  . Type II diabetes mellitus ([place])   . 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 Vaginal dysplasia     Past Surgical History:  Procedure Laterality Date  . ABDOMINAL HYSTERECTOMY  1980   NO oophorectomy per pt   . ANTERIOR CERVICAL DECOMP/DISCECTOMY FUSION  2001   C 3, C4 and C5 plate and screws  . BREAST BIOPSY Right 1999  . BUNIONECTOMY Left ~ 1977  . CATARACT EXTRACTION W/ INTRAOCULAR LENS  IMPLANT, BILATERAL  2012  . COLON SURGERY  10/2014  . EYE SURGERY Bilateral    torq lens for cataracts  . THYROIDECTOMY, PARTIAL  1960's  . VAGINAL MASS EXCISION  1994   "Laser surgery for vaginal cancer; followed by chemotherapy" (06/27/2012)    There were no vitals filed for this visit.      Subjective Assessment - 03/08/16 0858    Subjective My knee has really been hurting. My joints and my  hips feel like they are on fire. I think it is the rain. It hurt to even lie down. I think I over did it last session.   Pertinent History type 2 diabetes, vaginal cancer, colon cancer, rheumatoid factor positive, TIA, fibromyalgia, congestive heart failure, pt reports R knee needs surgery   Patient Stated Goals to get rid of the soreness in my abdomen so I can function better   Currently in Pain? Yes   Pain Score 5    Pain Location Knee   Pain Orientation Right   Pain Descriptors / Indicators Sharp;Aching   Pain Type Chronic pain   Pain Onset More than a month ago                         [facility] Adult PT Treatment/Exercise - 03/08/16 0001      Lumbar Exercises: Stretches   Pelvic Tilt Other (comment)  10x10" holds, then marching 10x, and then heel slide 5x      Lumbar Exercises: Supine   Other Supine Lumbar Exercises All Meeks  Decompression exercises 5 times for 5 second holds.                        Long Term Clinic Goals - 03/08/16 0902      CC Long Term Goal  #1   Title Pt will report a 50% improvement in abdominal pain when bending forward to allow her complete ADLs   Baseline 02/03/16- 25% improvement, 02/20/16- a little over 50% improvement   Time 4   Period Weeks   Status Achieved     CC Long Term Goal  #2   Title Pt will demonstrate R quad strength of 4/5 to decrease risk of falls    Baseline 3+/5, 02/20/16- 3+/5, 03/08/16- 3+/5   Time 4   Period Weeks   Status On-going     CC Long Term Goal  #3   Title Pt will be independent in nerve desensitization technique for long term management of pain   Baseline 02/20/16- pt is independent with this   Time 4   Period Weeks   Status Achieved     CC Long Term Goal  #4   Title Pt will be independent in a home exercise program for continued strengthening   Time 4   Period Weeks   Status On-going            Plan - 03/08/16 0903    Clinical Impression Statement Patient reports increased pain today in her hips and in her right knee. She feels that the rain may be affecting her pain but also feels it may have been the exercises. She reports she has a lot of inflammation with her fibromyalgia. Did gentle decompression exericses today and core exercises. Pt reports she can not tolerate stretches today because she is having so much pain in her joints. She was only able to perform 5 reps of heel slides with pelvic tilts secondary to knee pain.    Rehab Potential Good   Clinical Impairments Affecting Rehab Potential numerous comorbidities; healing L1 compression fracture    PT Frequency 2x / week   PT Duration 4 weeks   PT Treatment/Interventions ADLs/Self Care Home Management;Therapeutic exercise;Patient/family education;Manual techniques;Manual lymph drainage;Scar mobilization   PT Next Visit Plan Cont to focus on abdominal strengthening, meeks  decompression exercises, pelvic tilts with small marches, bridging, prone hip extension and Rt LE flexibility.   PT  Home Exercise Plan Meeks decompression exercises, pelvic tilts   Consulted and Agree with Plan of Care Patient      Patient will benefit from skilled therapeutic intervention in order to improve the following deficits and impairments:  Pain, Decreased strength, Increased edema, Difficulty walking  Visit Diagnosis: Muscle weakness (generalized)  Difficulty in walking, not elsewhere classified     Problem List Patient Active Problem List   Diagnosis Date Noted  . Spondylosis of lumbar region without myelopathy or radiculopathy 02/02/2016  . CAD in native artery 08/22/2015  . Hypertensive heart disease 07/07/2015  . Essential hypertension 07/07/2015  . SOB (shortness of breath) 07/07/2015  . Dizziness 07/07/2015  . Plantar fasciitis, left 03/22/2015  . Hx of colon cancer, stage I 01/11/2015  . Coronary artery disease due to lipid rich plaque 01/05/2015  . Chronic diastolic CHF (congestive heart failure), NYHA class 2 ([place]) 01/05/2015  . 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 Migraine (Ocular) 01/05/2014  . Gait difficulty 10/06/2013  . Pain in joint, shoulder region 11/27/2012  . VBI (vertebrobasilar insufficiency) 08/22/2012  . Right knee pain 07/29/2012  . TIA (transient ischemic attack) 06/27/2012  . Edema 02/01/2012  . DJD (degenerative joint disease) 02/02/2011  . 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 Postherpetic neuralgia ? 01/02/2010  . NECK PAIN 10/05/2009  . Palpitations 11/23/2008  . UTI'S, RECURRENT 09/28/2008  . Fibromyalgia 08/15/2007  . FATIGUE 11/19/2006  . 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 Hyperlipidemia 05/21/2006  . Reactive airway disease 01/29/2002    [facility] 03/08/2016, 12:17 PM  [place], [place], [address] Phone: [phone]   Fax:  [phone]  Name: Samantha [name] MRN: [id number] Date of Birth: Sep 06, 1940  Manus Gunning, PT 03/08/16 12:18 PM

## 2016-03-09 ENCOUNTER — Other Ambulatory Visit: Payer: Self-pay | Admitting: Internal Medicine

## 2016-03-12 ENCOUNTER — Ambulatory Visit: Payer: Medicare Other | Admitting: Physical Therapy

## 2016-03-12 VITALS — BP 196/98 | HR 60

## 2016-03-12 DIAGNOSIS — R222 Localized swelling, mass and lump, trunk: Secondary | ICD-10-CM | POA: Diagnosis not present

## 2016-03-12 DIAGNOSIS — M6281 Muscle weakness (generalized): Secondary | ICD-10-CM

## 2016-03-12 DIAGNOSIS — R262 Difficulty in walking, not elsewhere classified: Secondary | ICD-10-CM

## 2016-03-12 NOTE — Therapy (Signed)
[place], [place], [address] Phone: [phone]   Fax:  [phone]  Physical Therapy Treatment  Patient Details  Name: Samantha [name] MRN: [id number] Date of Birth: 1940/11/17 Referring Provider: Dr. Marcello Moores  Encounter Date: 03/12/2016      PT End of Session - 03/12/16 2022    Visit Number 14   Number of Visits 17   Date for PT Re-Evaluation 03/19/16   PT Start Time 1610   PT Stop Time 1715   PT Time Calculation (min) 65 min   Activity Tolerance Treatment limited secondary to medical complications (Comment)   Behavior During Therapy [facility] for tasks assessed/performed      Past Medical History:  Diagnosis Date  . Abnormal CT of the chest 2008   last CT4-l 2009:  . No f/u suggested   . Allergy   . Asthma   . CAD (coronary artery disease)    a. Coronary CTA 10/16: Coronary Ca score 211, mod non-obstructive CAD with LM mild plaque (25-50%), mid LAD 50-69%. b. Neg nuc 06/2015.  . Cataract    BILATERAL-REMOVED  . Chronic diastolic CHF (congestive heart failure) ([place])   . Collagen vascular disease ([place])    "arterial sclerosis" per pt  . Complication of anesthesia    trouble waking up  . Fibromyalgia   . GERD (gastroesophageal reflux disease)   . H/O hiatal hernia   . Heart murmur   . Hyperlipidemia   . Hypertension   . Malignant neoplasm of ascending colon ([place]) 2016   Minimally invasive right hemicolectomy to be done   . Neuromuscular disorder (HCC)    FIBROMYALGIA  . Ocular migraine   . OSA (obstructive sleep apnea) 09/2007   dx w/ a sleep study, not on  CPAP  . Osteoarthritis   . Osteoporosis   . Pneumonia    "double" in 2004  . PONV (postoperative nausea and vomiting)   . Reactive airway disease 01/29/2002   dx of pseudoasthma / vcd in 2005 and nl sprirometry History of dyspnea, 2011,  improved after several medications were changed around Question of COPD, disproved July 06, 2009  with nl pft's      . Rheumatoid factor positive   . Shingles 11/2009  . Sleep apnea   . Stroke ([place])   . TIA (transient ischemic attack)    x2 - on Plavix for this  . Torn rotator cuff    right worse than left, both are torn  . Tumor, 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  . Type II diabetes mellitus ([place])   . 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 Vaginal dysplasia     Past Surgical History:  Procedure Laterality Date  . ABDOMINAL HYSTERECTOMY  1980   NO oophorectomy per pt   . ANTERIOR CERVICAL DECOMP/DISCECTOMY FUSION  2001   C 3, C4 and C5 plate and screws  . BREAST BIOPSY Right 1999  . BUNIONECTOMY Left ~ 1977  . CATARACT EXTRACTION W/ INTRAOCULAR LENS  IMPLANT, BILATERAL  2012  . COLON SURGERY  10/2014  . EYE SURGERY Bilateral    torq lens for cataracts  . THYROIDECTOMY, PARTIAL  1960's  . VAGINAL MASS EXCISION  1994   "Laser surgery for vaginal cancer; followed by chemotherapy" (06/27/2012)    Vitals:   03/12/16 1611 03/12/16 1654 03/12/16 1710 03/12/16 1711  BP: (!) 180/94 (!) 150/80 (!) 200/90 (!) 196/98  Pulse: 60     SpO2:  99%           Subjective Assessment - 03/12/16 1611    Subjective Nothing new.  "I need to do surgery for the right knee."  "They gave me two injections, but even so, if I'm on it too much, it hurts real bad."  "The lymph drainage thing seems to be working."   Currently in Pain? Yes   Pain Score 6    Pain Location Knee   Pain Orientation Right   Pain Descriptors / Indicators Sharp   Aggravating Factors  worse as the day goes on   Pain Relieving Factors Tylenol   Multiple Pain Sites Yes   Pain Score 4   Pain Location Arm   Pain Orientation Right;Left;Upper   Aggravating Factors  reaching the arm out, weather   Pain Relieving Factors rubbing it, use heat, Lyrica, Tylenol                         [facility] Adult PT Treatment/Exercise - 03/12/16 0001      Lumbar Exercises: Standing   Other Standing Lumbar Exercises  standing facing elevated mat at countertop height, opposite arm and leg raises x 5 each  pt. reported vertigo symptoms--see vital signs     Lumbar Exercises: Supine   Ab Set 5 reps  each side, with LE extension, keeping foot just off of mat   Clam 10 reps;1 second   Bridge 10 reps   Other Supine Lumbar Exercises All Meeks Decompression exercises 5 times for 5 second holds.                        Long Term Clinic Goals - 03/08/16 0902      CC Long Term Goal  #1   Title Pt will report a 50% improvement in abdominal pain when bending forward to allow her complete ADLs   Baseline 02/03/16- 25% improvement, 02/20/16- a little over 50% improvement   Time 4   Period Weeks   Status Achieved     CC Long Term Goal  #2   Title Pt will demonstrate R quad strength of 4/5 to decrease risk of falls    Baseline 3+/5, 02/20/16- 3+/5, 03/08/16- 3+/5   Time 4   Period Weeks   Status On-going     CC Long Term Goal  #3   Title Pt will be independent in nerve desensitization technique for long term management of pain   Baseline 02/20/16- pt is independent with this   Time 4   Period Weeks   Status Achieved     CC Long Term Goal  #4   Title Pt will be independent in a home exercise program for continued strengthening   Time 4   Period Weeks   Status On-going            Plan - 03/12/16 2023    Clinical Impression Statement Patient did well for several abdominal strengthening exercises.  After doing five repetitions of opposite arm/leg raises in standing with UE suport on elevated mat, she reported a sense of the room spinning.  She was encouraged to sit down in long sit position on mat with back support. O2 sats and HR were fine but blood pressure was elevated.  She started to feel better and blood pressure was reduced, but then was elevated again two subsequent times.  Pt. talked about her eye problems including optic migraines; she also talked about her  blood pressure medicine  having been reduced fairly recently.  She did not want therapist to call her daughter for a ride home, but called her daughter herself to let her know how she was feeling and rested a while.  She reported she felt better before driving home.  She has a blood pressure monitor at home and planned to check it at home and if it did not reduce, to call her cardiologist.   Rehab Potential Good   Clinical Impairments Affecting Rehab Potential numerous comorbidities; healing L1 compression fracture    PT Frequency 2x / week   PT Duration 4 weeks   PT Treatment/Interventions ADLs/Self Care Home Management;Therapeutic exercise;Patient/family education;Manual techniques;Manual lymph drainage;Scar mobilization   PT Next Visit Plan Check goals. Cont to focus on abdominal strengthening, meeks decompression exercises, pelvic tilts with small marches, bridging, prone hip extension and Rt LE flexibility.   Consulted and Agree with Plan of Care Patient      Patient will benefit from skilled therapeutic intervention in order to improve the following deficits and impairments:  Pain, Decreased strength, Increased edema, Difficulty walking  Visit Diagnosis: Muscle weakness (generalized)  Difficulty in walking, not elsewhere classified     Problem List Patient Active Problem List   Diagnosis Date Noted  . Spondylosis of lumbar region without myelopathy or radiculopathy 02/02/2016  . CAD in native artery 08/22/2015  . Hypertensive heart disease 07/07/2015  . Essential hypertension 07/07/2015  . SOB (shortness of breath) 07/07/2015  . Dizziness 07/07/2015  . Plantar fasciitis, left 03/22/2015  . Hx of colon cancer, stage I 01/11/2015  . Coronary artery disease due to lipid rich plaque 01/05/2015  . Chronic diastolic CHF (congestive heart failure), NYHA class 2 ([place]) 01/05/2015  . 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 Migraine (Ocular) 01/05/2014   . Gait difficulty 10/06/2013  . Pain in joint, shoulder region 11/27/2012  . VBI (vertebrobasilar insufficiency) 08/22/2012  . Right knee pain 07/29/2012  . TIA (transient ischemic attack) 06/27/2012  . Edema 02/01/2012  . DJD (degenerative joint disease) 02/02/2011  . 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 Postherpetic neuralgia ? 01/02/2010  . NECK PAIN 10/05/2009  . Palpitations 11/23/2008  . UTI'S, RECURRENT 09/28/2008  . Fibromyalgia 08/15/2007  . FATIGUE 11/19/2006  . 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 Hyperlipidemia 05/21/2006  . Reactive airway disease 01/29/2002    Jermey Closs 03/12/2016, 8:29 PM  [place], [place], [address] Phone: [phone]   Fax:  [phone]  Name: Samantha [name] MRN: [id number] Date of Birth: 1940/02/23  Serafina Royals, PT 03/12/16 8:30 PM

## 2016-03-13 DIAGNOSIS — G43109 Migraine with aura, not intractable, without status migrainosus: Secondary | ICD-10-CM | POA: Diagnosis not present

## 2016-03-13 DIAGNOSIS — H43812 Vitreous degeneration, left eye: Secondary | ICD-10-CM | POA: Diagnosis not present

## 2016-03-13 DIAGNOSIS — E119 Type 2 diabetes mellitus without complications: Secondary | ICD-10-CM | POA: Diagnosis not present

## 2016-03-13 LAB — HM DIABETES EYE EXAM

## 2016-03-14 ENCOUNTER — Telehealth: Payer: Self-pay | Admitting: Cardiology

## 2016-03-14 ENCOUNTER — Ambulatory Visit: Payer: Medicare Other | Admitting: Physical Therapy

## 2016-03-14 DIAGNOSIS — R262 Difficulty in walking, not elsewhere classified: Secondary | ICD-10-CM

## 2016-03-14 DIAGNOSIS — M6281 Muscle weakness (generalized): Secondary | ICD-10-CM

## 2016-03-14 DIAGNOSIS — R222 Localized swelling, mass and lump, trunk: Secondary | ICD-10-CM | POA: Diagnosis not present

## 2016-03-14 NOTE — Patient Instructions (Signed)
HIP: Flexion / KNEE: Extension, Straight Leg Raise    Raise leg, keeping knee straight. Perform slowly. _5-10__ reps per set, _2__ sets per day,   Squeeze thigh, raise just a few inches, lower slowly and relax  Copyright  VHI. All rights reserved.

## 2016-03-14 NOTE — Telephone Encounter (Signed)
New message       Pt c/o BP issue: STAT if pt c/o blurred vision, one-sided weakness or slurred speech  1. What are your last 5 BP readings?  Yesterday at physical therapy 200/97 at physical therapy but pt forgot rx; 139/83 6pm after taking rx, 156/77 8:30pm yesterday, 137/84 before bedtime yesterday, 135/77 this am  2. Are you having any other symptoms (ex. Dizziness, headache, blurred vision, passed out)?  no  3. What is your BP issue?  Pt forgot to take her bp meds yesterday and her bp spiked at physical therapy.  It seems to be better now but she want a nurse to call her this afternoon.

## 2016-03-14 NOTE — Telephone Encounter (Signed)
Pt just calling, because her Physical Therapist advised her too, to inform Dr Meda Coffee of her BP readings during therapy yesterday.   Readings are mentioned above in this message.  BP at therapy was 200/97.   Pt states she forgot to take her BP medications prior to going to therapy, and that's why her pressure spiked.   Pt reports when she got home from PT, she took her meds and waited an hour, and her BP returned back to baseline at 139/83 at 6 pm last night and around 8 pm last night it was 156/77, and before bedtime it went back down to 137/84, and  asymptomatic the whole time.  Pt states she took all her meds this morning and took her pressure and it was 135/77, where the pt reports that's where her normal baseline BP always stays.   Pt states she had no symptoms at all during the course of forgetting to take her meds, to current time this morning.  Pt states "I just made a promise to my Physical Therapist that I would make Dr Meda Coffee aware of my high reading yesterday, even though I forgot to take my BP med."  Advised the pt to make sure she takes all her cardiac meds just as prescribed with no missed doses. Advised to continue monitoring her BP as needed, and report any concerning symptoms and values to myself or Dr Meda Coffee.   Informed the pt that I will pass this information along to Dr Meda Coffee, and follow-up with her only if Dr Meda Coffee has any further recommendations.   Pt verbalized understanding and agrees with this plan.  Pt more than gracious for all the assistance provided.

## 2016-03-14 NOTE — Therapy (Signed)
[place], [place], [address] Phone: [phone]   Fax:  [phone]  Physical Therapy Treatment  Patient Details  Name: Samantha [name] MRN: [id number] Date of Birth: 09-14-1940 Referring Provider: Dr. Marcello Moores  Encounter Date: 03/14/2016      PT End of Session - 03/14/16 1124    Visit Number 15   Number of Visits 17   Date for PT Re-Evaluation 03/19/16   PT Start Time 0950  pt arrived late    PT Stop Time 1015   PT Time Calculation (min) 25 min   Activity Tolerance Patient tolerated treatment well   Behavior During Therapy [facility] for tasks assessed/performed      Past Medical History:  Diagnosis Date  . Abnormal CT of the chest 2008   last CT4-l 2009:  . No f/u suggested   . Allergy   . Asthma   . CAD (coronary artery disease)    a. Coronary CTA 10/16: Coronary Ca score 211, mod non-obstructive CAD with LM mild plaque (25-50%), mid LAD 50-69%. b. Neg nuc 06/2015.  . Cataract    BILATERAL-REMOVED  . Chronic diastolic CHF (congestive heart failure) ([place])   . Collagen vascular disease ([place])    "arterial sclerosis" per pt  . Complication of anesthesia    trouble waking up  . Fibromyalgia   . GERD (gastroesophageal reflux disease)   . H/O hiatal hernia   . Heart murmur   . Hyperlipidemia   . Hypertension   . Malignant neoplasm of ascending colon ([place]) 2016   Minimally invasive right hemicolectomy to be done   . Neuromuscular disorder (HCC)    FIBROMYALGIA  . Ocular migraine   . OSA (obstructive sleep apnea) 09/2007   dx w/ a sleep study, not on  CPAP  . Osteoarthritis   . Osteoporosis   . Pneumonia    "double" in 2004  . PONV (postoperative nausea and vomiting)   . Reactive airway disease 01/29/2002   dx of pseudoasthma / vcd in 2005 and nl sprirometry History of dyspnea, 2011,  improved after several medications were changed around Question of COPD, disproved July 06, 2009 with nl pft's       . Rheumatoid factor positive   . Shingles 11/2009  . Sleep apnea   . Stroke ([place])   . TIA (transient ischemic attack)    x2 - on Plavix for this  . Torn rotator cuff    right worse than left, both are torn  . Tumor, 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  . Type II diabetes mellitus ([place])   . 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 Vaginal dysplasia     Past Surgical History:  Procedure Laterality Date  . ABDOMINAL HYSTERECTOMY  1980   NO oophorectomy per pt   . ANTERIOR CERVICAL DECOMP/DISCECTOMY FUSION  2001   C 3, C4 and C5 plate and screws  . BREAST BIOPSY Right 1999  . BUNIONECTOMY Left ~ 1977  . CATARACT EXTRACTION W/ INTRAOCULAR LENS  IMPLANT, BILATERAL  2012  . COLON SURGERY  10/2014  . EYE SURGERY Bilateral    torq lens for cataracts  . THYROIDECTOMY, PARTIAL  1960's  . VAGINAL MASS EXCISION  1994   "Laser surgery for vaginal cancer; followed by chemotherapy" (06/27/2012)    There were no vitals filed for this visit.      Subjective Assessment - 03/14/16 0949    Subjective I'm pain free this moring  Pertinent History type 2 diabetes, vaginal cancer, colon cancer, rheumatoid factor positive, TIA, fibromyalgia, congestive heart failure, pt reports R knee needs surgery   Patient Stated Goals to get rid of the soreness in my abdomen so I can function better   Currently in Pain? No/denies                         [facility] Adult PT Treatment/Exercise - 03/14/16 0001      Lumbar Exercises: Standing   Row Strengthening;Both;10 reps;Theraband   Theraband Level (Row) Level 2 (Red)     Lumbar Exercises: Seated   Other Seated Lumbar Exercises sitting on edge of mat with core engaged in tall posture,: unilateral arm raises then hands together for touch to hip, then out front, then to other hip with cues to keep core engaged.      Lumbar Exercises: Supine   Straight Leg Raise 5 reps;Other (comment)   Straight Leg Raises Limitations 2 sets with cues to  quad set, raise a couple inches, lower , relax     Knee/Hip Exercises: Standing   Forward Lunges Limitations pt not able to do with right leg forward due to pain in knee    Hip Abduction Stengthening;Both;1 set;5 reps   Hip Extension Stengthening;Both;2 sets;5 reps;Knee straight   Extension Limitations one set with opposite UE up wall    Functional Squat 1 set;5 reps   Functional Squat Limitations very small partial range at top with cues for glute set                 PT Education - 03/14/16 1017    Education provided Yes   Education Details straight leg raise and standing rows with red theraband    Person(s) Educated Patient   Methods Demonstration   Comprehension Verbalized understanding;Returned demonstration                [facility] Goals - 03/08/16 0902      CC Long Term Goal  #1   Title Pt will report a 50% improvement in abdominal pain when bending forward to allow her complete ADLs   Baseline 02/03/16- 25% improvement, 02/20/16- a little over 50% improvement   Time 4   Period Weeks   Status Achieved     CC Long Term Goal  #2   Title Pt will demonstrate R quad strength of 4/5 to decrease risk of falls    Baseline 3+/5, 02/20/16- 3+/5, 03/08/16- 3+/5   Time 4   Period Weeks   Status On-going     CC Long Term Goal  #3   Title Pt will be independent in nerve desensitization technique for long term management of pain   Baseline 02/20/16- pt is independent with this   Time 4   Period Weeks   Status Achieved     CC Long Term Goal  #4   Title Pt will be independent in a home exercise program for continued strengthening   Time 4   Period Weeks   Status On-going            Plan - 03/14/16 1125    Clinical Impression Statement Pt arrived late today, but she reports she is feeling much better.  She admits she forgot to take her blood pressure medication last visit.  She says she is continuing to monitor her blood pressure per her MD instruction.   She performed well with core and LE strengthening today and says  she hopes she can exercise to lose weight and get stronger in effort to avoid surgery on her knee  Upgraded HEP today    Rehab Potential Good   Clinical Impairments Affecting Rehab Potential numerous comorbidities; healing L1 compression fracture    PT Next Visit Plan Check goals. Cont to focus on abdominal strengthening, meeks decompression exercises, pelvic tilts with small marches, bridging, prone hip extension and Rt LE flexibility. along with right quad strenthening    Consulted and Agree with Plan of Care Patient      Patient will benefit from skilled therapeutic intervention in order to improve the following deficits and impairments:  Pain, Decreased strength, Increased edema, Difficulty walking  Visit Diagnosis: Muscle weakness (generalized)  Difficulty in walking, not elsewhere classified  Localized swelling, mass and lump, trunk     Problem List Patient Active Problem List   Diagnosis Date Noted  . Spondylosis of lumbar region without myelopathy or radiculopathy 02/02/2016  . CAD in native artery 08/22/2015  . Hypertensive heart disease 07/07/2015  . Essential hypertension 07/07/2015  . SOB (shortness of breath) 07/07/2015  . Dizziness 07/07/2015  . Plantar fasciitis, left 03/22/2015  . Hx of colon cancer, stage I 01/11/2015  . Coronary artery disease due to lipid rich plaque 01/05/2015  . Chronic diastolic CHF (congestive heart failure), NYHA class 2 ([place]) 01/05/2015  . 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 Migraine (Ocular) 01/05/2014  . Gait difficulty 10/06/2013  . Pain in joint, shoulder region 11/27/2012  . VBI (vertebrobasilar insufficiency) 08/22/2012  . Right knee pain 07/29/2012  . TIA (transient ischemic attack) 06/27/2012  . Edema 02/01/2012  . DJD (degenerative joint disease) 02/02/2011  . 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  Postherpetic neuralgia ? 01/02/2010  . NECK PAIN 10/05/2009  . Palpitations 11/23/2008  . UTI'S, RECURRENT 09/28/2008  . Fibromyalgia 08/15/2007  . FATIGUE 11/19/2006  . 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 Hyperlipidemia 05/21/2006  . Reactive airway disease 01/29/2002   Donato Heinz. Owens Shark PT  Norwood Levo 03/14/2016, 11:29 AM  [place], [place], [address] Phone: [phone]   Fax:  [phone]  Name: YUDELKA LEH MRN: [id number] Date of Birth: [birth year]

## 2016-03-19 ENCOUNTER — Ambulatory Visit: Payer: Medicare Other | Admitting: Physical Therapy

## 2016-03-19 DIAGNOSIS — M6281 Muscle weakness (generalized): Secondary | ICD-10-CM

## 2016-03-19 DIAGNOSIS — R262 Difficulty in walking, not elsewhere classified: Secondary | ICD-10-CM | POA: Diagnosis not present

## 2016-03-19 DIAGNOSIS — R222 Localized swelling, mass and lump, trunk: Secondary | ICD-10-CM | POA: Diagnosis not present

## 2016-03-19 NOTE — Therapy (Signed)
[place], [place], [address] Phone: [phone]   Fax:  [phone]  Physical Therapy Treatment  Patient Details  Name: Samantha [name] MRN: [id number] Date of Birth: 10/30/1940 Referring Provider: Dr. Marcello Moores  Encounter Date: 03/19/2016      PT End of Session - 03/19/16 1501    Visit Number 63  now in [place]   Number of Visits 24   Date for PT Re-Evaluation 04/27/16   PT Start Time 1402  pt. late   PT Stop Time 1500   PT Time Calculation (min) 58 min   Activity Tolerance Patient tolerated treatment well   Behavior During Therapy [facility] for tasks assessed/performed      Past Medical History:  Diagnosis Date  . Abnormal CT of the chest 2008   last CT4-l 2009:  . No f/u suggested   . Allergy   . Asthma   . CAD (coronary artery disease)    a. Coronary CTA 10/16: Coronary Ca score 211, mod non-obstructive CAD with LM mild plaque (25-50%), mid LAD 50-69%. b. Neg nuc 06/2015.  . Cataract    BILATERAL-REMOVED  . Chronic diastolic CHF (congestive heart failure) ([place])   . Collagen vascular disease ([place])    "arterial sclerosis" per pt  . Complication of anesthesia    trouble waking up  . Fibromyalgia   . GERD (gastroesophageal reflux disease)   . H/O hiatal hernia   . Heart murmur   . Hyperlipidemia   . Hypertension   . Malignant neoplasm of ascending colon ([place]) 2016   Minimally invasive right hemicolectomy to be done   . Neuromuscular disorder (HCC)    FIBROMYALGIA  . Ocular migraine   . OSA (obstructive sleep apnea) 09/2007   dx w/ a sleep study, not on  CPAP  . Osteoarthritis   . Osteoporosis   . Pneumonia    "double" in 2004  . PONV (postoperative nausea and vomiting)   . Reactive airway disease 01/29/2002   dx of pseudoasthma / vcd in 2005 and nl sprirometry History of dyspnea, 2011,  improved after several medications were changed around Question of COPD, disproved July 06, 2009 with nl  pft's      . Rheumatoid factor positive   . Shingles 11/2009  . Sleep apnea   . Stroke ([place])   . TIA (transient ischemic attack)    x2 - on Plavix for this  . Torn rotator cuff    right worse than left, both are torn  . Tumor, 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  . Type II diabetes mellitus ([place])   . 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 Vaginal dysplasia     Past Surgical History:  Procedure Laterality Date  . ABDOMINAL HYSTERECTOMY  1980   NO oophorectomy per pt   . ANTERIOR CERVICAL DECOMP/DISCECTOMY FUSION  2001   C 3, C4 and C5 plate and screws  . BREAST BIOPSY Right 1999  . BUNIONECTOMY Left ~ 1977  . CATARACT EXTRACTION W/ INTRAOCULAR LENS  IMPLANT, BILATERAL  2012  . COLON SURGERY  10/2014  . EYE SURGERY Bilateral    torq lens for cataracts  . THYROIDECTOMY, PARTIAL  1960's  . VAGINAL MASS EXCISION  1994   "Laser surgery for vaginal cancer; followed by chemotherapy" (06/27/2012)    There were no vitals filed for this visit.      Subjective Assessment - 03/19/16 1405    Subjective "Afternoons just don't  work for me." Helping her husband is taxing: he has heart failure, MS, dementia, diabetes, bilateral amputations.  She feels therapy is definitely still helping.  She knows she needs right knee surgery, but can't consider doing that right now with her husband's needs at home.  She is feeling overwhelmed right now; she was told some months ago that she needs a stent, she feels something is changing, and she wants to have a cardiac workup before doing any more therapy.  She feels something is changing. She says she has called and is waiting for an appointment with the cardiologist.   Currently in Pain? Yes   Pain Score 5    Pain Location Abdomen   Pain Orientation Left;Upper   Pain Descriptors / Indicators Other (Comment)  "not real bad but letting me know it's there"   Aggravating Factors  unsure   Pain Relieving Factors nothing            [facility] PT Assessment -  03/19/16 0001      Strength   Right Knee Extension 3+/5                     [facility] Adult PT Treatment/Exercise - 03/19/16 0001      Lumbar Exercises: Supine   Ab Set 5 reps  each side, with LE extension, keeping foot just off of mat   Bridge 10 reps   Straight Leg Raise 5 reps;1 second   Straight Leg Raises Limitations 2 sets with cues to quad set, raise a couple inches, lower , relax   Other Supine Lumbar Exercises ab set with leg lengthening and press into mat, hold 3 seconds x 5 each leg  does 2 sets at home   Other Supine Lumbar Exercises ab set with marches, 5 reps on each leg     Knee/Hip Exercises: Supine   Other Supine Knee/Hip Exercises hooklying bilat. hip abduction vs. green Theraband x 10                PT Education - 03/19/16 1501    Education provided Yes   Education Details added to HEP:  bridging, ab sets with marching, and supine hooklying abduction vs. green Theraband   Person(s) Educated Patient   Methods Explanation;Demonstration;Verbal cues;Handout   Comprehension Verbalized understanding;Returned demonstration                [facility] Goals - 03/19/16 1410      CC Long Term Goal  #1   Title Pt will report a 50% improvement in abdominal pain when bending forward to allow her complete ADLs   Status Achieved     CC Long Term Goal  #2   Title Pt will demonstrate R quad strength of 4/5 to decrease risk of falls    Baseline 3+/5, 02/20/16- 3+/5, 03/08/16- 3+/5; 03/19/16 still 3+/5     CC Long Term Goal  #3   Title Pt will be independent in nerve desensitization technique for long term management of pain   Status Achieved     CC Long Term Goal  #4   Title Pt will be independent in a home exercise program for continued strengthening   Status Partially Met            Plan - 03/19/16 1502    Clinical Impression Statement Lengthy discussion with patient today about where we go from here.  She feels therapy is very  beneficial; she has met some but not all  of her goals.  She wants to come one more time but then be on hold until she has a follow-up with cardiologist to make sure she is okay to continue. She was told that she needs a heart stent, though it wasn't urgent at the time several months ago.  Renewal will be sent today, but patient will not schedule more appointments until after she has seen cardiologist.   Rehab Potential Good   Clinical Impairments Affecting Rehab Potential numerous comorbidities; healing L1 compression fracture    PT Frequency 2x / week   PT Duration 4 weeks   PT Treatment/Interventions ADLs/Self Care Home Management;Therapeutic exercise;Patient/family education;Manual techniques;Manual lymph drainage;Scar mobilization   PT Next Visit Plan Focus on knee strengthening and abdominal; review newer HEP prn.   Consulted and Agree with Plan of Care Patient      Patient will benefit from skilled therapeutic intervention in order to improve the following deficits and impairments:  Pain, Decreased strength, Increased edema, Difficulty walking  Visit Diagnosis: Muscle weakness (generalized) - Plan: PT plan of care cert/re-cert  Difficulty in walking, not elsewhere classified - Plan: PT plan of care cert/re-cert     Problem List Patient Active Problem List   Diagnosis Date Noted  . Spondylosis of lumbar region without myelopathy or radiculopathy 02/02/2016  . CAD in native artery 08/22/2015  . Hypertensive heart disease 07/07/2015  . Essential hypertension 07/07/2015  . SOB (shortness of breath) 07/07/2015  . Dizziness 07/07/2015  . Plantar fasciitis, left 03/22/2015  . Hx of colon cancer, stage I 01/11/2015  . Coronary artery disease due to lipid rich plaque 01/05/2015  . Chronic diastolic CHF (congestive heart failure), NYHA class 2 ([place]) 01/05/2015  . 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 Migraine (Ocular)  01/05/2014  . Gait difficulty 10/06/2013  . Pain in joint, shoulder region 11/27/2012  . VBI (vertebrobasilar insufficiency) 08/22/2012  . Right knee pain 07/29/2012  . TIA (transient ischemic attack) 06/27/2012  . Edema 02/01/2012  . DJD (degenerative joint disease) 02/02/2011  . 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 Postherpetic neuralgia ? 01/02/2010  . NECK PAIN 10/05/2009  . Palpitations 11/23/2008  . UTI'S, RECURRENT 09/28/2008  . Fibromyalgia 08/15/2007  . FATIGUE 11/19/2006  . 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 Hyperlipidemia 05/21/2006  . Reactive airway disease 01/29/2002    Samantha [name] 03/19/2016, 3:14 PM  [place], [place], [address] Phone: [phone]   Fax:  [phone]  Name: Samantha [name] MRN: [id number] Date of Birth: [birth year]  Samantha [name], PT 03/19/16 3:14 PM

## 2016-03-19 NOTE — Patient Instructions (Signed)
(  1) Bracing With Bridging (Hook-Lying)    With neutral spine, tighten pelvic floor and abdominals and hold. Lift bottom. Repeat _10__ times. Do __1_ times a day.   Copyright  VHI. All rights reserved.     (2) Same starting position as above with knees bent and back flat. Tighten abdominal muscles and hold belly button toward spine while you take small marching steps, alternating, 5 on each side. Do 1-5 sets of this daily.    (3) HIP: Abduction / External Rotation (Band)    Place GREEN band around knees. Lie on YOUR BACK with hips and knees bent. Spread knees apart, keeping feet together. Hold _1__ seconds. Use ____green____ band. _10__ reps per set, _1-3__ sets per day, _6__ days per week   Copyright  VHI. All rights reserved.

## 2016-03-20 ENCOUNTER — Telehealth: Payer: Self-pay | Admitting: Cardiology

## 2016-03-20 NOTE — Telephone Encounter (Signed)
Patient states that she has had two episodes of chest pressure. One last Thursday and the other yesterday. She states that she has had to take 1 NTG each time and that her pain is relieved. She states that she experienced sweating and slight SOB during each episode. She states that she has had BP spikes with systolics in the [id number]. She states that she has been taking her medications as prescribed. Patient's current BP is 149/77 with a HR of 68. Patient is currently not experiencing any symptoms. Appointment made with Nell Range, PA tomorrow at 9:30 am. Patient educated on when to go to the ER. Patient verbalized understanding.

## 2016-03-20 NOTE — Progress Notes (Signed)
Cardiology Office Note    Date:  03/21/2016   ID:  Samantha [name], Samantha [name] 1940-02-07, MRN [id number]  PCP:  [name], MD  Cardiologist:  [name]  CC: chest pressure  History of Present Illness:  Samantha [name] is a 76 y.o. female with a history of CAD (moderate nonobstructive disease by cardiac CT 10/2014), TIA (on Plavix), chronic diastolic CHF, DM2, HTN, HL, fibromyalgia, palpitations secondary to symptomatic PVCs, LE edema, colon cancer (s/p lap partial colectomy 10/2014), reactive airway disease who presents to clinic for evaluation of chest pressure.  Per review of chart, she has a history of CP in 2016 felt MSK but nuclear stress test was abnormal. She underwent cardiac CT 10/2014 showing 50-69% in the prox and mid LAD. She went on to have unremarkable colon surgery. 2D echo 09/2014: focal basal and moderate concentric hypertrophy, EF 60-65%, grade 1 DD. She saw [name] on 07/07/15 with complaints of dizziness and SOB. This began after remodeling her home with removal of carpet and a lot of dust. EKG was unchanged from prior. BP was 124/68. [name] recommended holding hydralazine and reassessing for need for further testing. CMET 6/7 unremarkable, LDL 73. I saw her in clinic 07/25/15 at which time she was reporting similar sx. Hgb was stable and BNP was negative. CTA neg for PE or other acute disease, no change. Nuc 08/03/15 was normal.   She was last seen by [name] in 01/2016 for follow up. She as doing well with no complaints. Her sx were felt related to reactive airway disease and if they recurred escalation of pulmonary regimen recommended.   Today she presents to clinic for follow up. Has been having intermittent chest pain since last Monday. She noted it first when she was at PT doing leg raises. She had pressure in her chest associated with SOB and dizziness. It radiates to her shoulder blade and left arm. Chest pain has occurred intermittently since that time mostly  with exertion. It goes away with with SL NTG and ASA. She almost had to call 911 the other day it was so severe. She has chronic mild  LE edema. She sleeps on 2-3 pillows chronically. No PND. No dizziness or syncope.   Past Medical History:  Diagnosis Date  . Abnormal CT of the chest 2008   last CT4-l 2009:  . No f/u suggested   . Allergy   . Asthma   . CAD (coronary artery disease)    a. Coronary CTA 10/16: Coronary Ca score 211, mod non-obstructive CAD with LM mild plaque (25-50%), mid LAD 50-69%. b. Neg nuc 06/2015.  . Cataract    BILATERAL-REMOVED  . Chronic diastolic CHF (congestive heart failure) ([place])   . Collagen vascular disease ([place])    "arterial sclerosis" per pt  . Complication of anesthesia    trouble waking up  . Fibromyalgia   . GERD (gastroesophageal reflux disease)   . H/O hiatal hernia   . Heart murmur   . Hyperlipidemia   . Hypertension   . Malignant neoplasm of ascending colon ([place]) 2016   Minimally invasive right hemicolectomy to be done   . Neuromuscular disorder ([facility])    FIBROMYALGIA  . Ocular migraine   . OSA (obstructive sleep apnea) 09/2007   dx w/ a sleep study, not on  CPAP  . Osteoarthritis   . Osteoporosis   . Pneumonia    "double" in 2004  . PONV (postoperative nausea and vomiting)   .  Reactive airway disease 01/29/2002   dx of pseudoasthma / vcd in 2005 and nl sprirometry History of dyspnea, 2011,  improved after several medications were changed around Question of COPD, disproved July 06, 2009 with nl pft's      . Rheumatoid factor positive   . Shingles 11/2009  . Sleep apnea   . Stroke ([place])   . TIA (transient ischemic attack)    x2 - on Plavix for this  . Torn rotator cuff    right worse than left, both are torn  . Tumor, 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  . Type II diabetes mellitus ([place])   . 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 Vaginal dysplasia     Past Surgical History:  Procedure Laterality Date  . ABDOMINAL HYSTERECTOMY  1980    NO oophorectomy per pt   . ANTERIOR CERVICAL DECOMP/DISCECTOMY FUSION  2001   C 3, C4 and C5 plate and screws  . BREAST BIOPSY Right 1999  . BUNIONECTOMY Left ~ 1977  . CATARACT EXTRACTION W/ INTRAOCULAR LENS  IMPLANT, BILATERAL  2012  . COLON SURGERY  10/2014  . EYE SURGERY Bilateral    torq lens for cataracts  . THYROIDECTOMY, PARTIAL  1960's  . VAGINAL MASS EXCISION  1994   "Laser surgery for vaginal cancer; followed by chemotherapy" (06/27/2012)    Current Medications: Outpatient Medications Prior to Visit  Medication Sig Dispense Refill  . acetaminophen (TYLENOL) 500 MG tablet Take 1,000 mg by mouth every 6 (six) hours as needed for moderate pain.    [name] albuterol (PROVENTIL HFA;VENTOLIN HFA) 108 (90 Base) MCG/ACT inhaler Inhale 2 puffs into the lungs every 6 (six) hours as needed for wheezing or shortness of breath. 18 g 3  . atorvastatin (LIPITOR) 20 MG tablet TAKE 1 TABLET (20 MG TOTAL) BY MOUTH DAILY. 90 tablet 2  . butalbital-acetaminophen-caffeine (FIORICET) 50-325-40 MG tablet Take 1 tablet by mouth every 6 (six) hours as needed for headache or migraine. 14 tablet 5  . CALCIUM PO Take 1 tablet by mouth every morning.     . cetirizine (ZYRTEC) 10 MG tablet TAKE 1 TABLET (10 MG TOTAL) BY MOUTH DAILY. 90 tablet 1  . cholecalciferol (VITAMIN D) 1000 UNITS tablet Take 1,000 Units by mouth every morning.     . cholestyramine (QUESTRAN) 4 g packet Take 4 g by mouth every other day.    . clopidogrel (PLAVIX) 75 MG tablet Take 1 tablet (75 mg total) by mouth daily. 90 tablet 1  . furosemide (LASIX) 20 MG tablet Take 1 tablet (20 mg total) by mouth daily. 90 tablet 3  . HYDROcodone-acetaminophen (NORCO) 5-325 MG tablet Take 1 tablet by mouth every 4 (four) hours as needed for moderate pain. 40 tablet 0  . hydrocortisone 2.5 % cream Apply topically 2 (two) times daily. 30 g 0  . lisinopril (PRINIVIL,ZESTRIL) 20 MG tablet Take 1 tablet (20 mg total) by mouth daily. 90 tablet 3  .  metformin (FORTAMET) 500 MG (OSM) 24 hr tablet Take 1 tablet (500 mg total) by mouth daily with breakfast. 90 tablet 1  . Multiple Vitamin (MULTIVITAMIN) capsule Take 1 capsule by mouth daily.      . nebivolol (BYSTOLIC) 10 MG tablet Take 3 tablets (30 mg total) by mouth daily. 90 tablet 9  . nitroGLYCERIN (NITROSTAT) 0.4 MG SL tablet Place 0.4 mg under the tongue every 5 (five) minutes as needed for chest pain (x 3 doses).    [name]  ondansetron (ZOFRAN ODT) 4 MG disintegrating tablet Take 1 tablet (4 mg total) by mouth every 8 (eight) hours as needed for nausea or vomiting. 12 tablet 1  . Polyethyl Glycol-Propyl Glycol (SYSTANE) 0.4-0.3 % GEL Place 1 drop into both eyes daily as needed (dry eyes).     . Potassium 99 MG TABS Take 99 mg by mouth daily.     . pregabalin (LYRICA) 100 MG capsule Take 1 capsule (100 mg total) by mouth as needed (FIBROMYALGIA). 30 capsule 2  . ranitidine (ZANTAC) 300 MG tablet Take 1 tablet (300 mg total) by mouth at bedtime. 90 tablet 3  . calcitonin, salmon, (MIACALCIN) 200 UNIT/ACT nasal spray Place 1 spray into alternate nostrils daily. (Patient not taking: Reported on 03/21/2016) 3.7 mL 12   Facility-Administered Medications Prior to Visit  Medication Dose Route Frequency Provider Last Rate Last Dose  . 0.9 %  sodium chloride infusion  500 mL Intravenous Continuous Milus Banister, MD         Allergies:   Bactrim [sulfamethoxazole-trimethoprim]; Cefuroxime axetil; Oxycodone; Pravastatin; Seldane [terfenadine]; Zocor [simvastatin]; and Tramadol   Social History   Social History  . Marital status: Married    Spouse name: Ilona Sorrel  . Number of children: 2  . Years of education: masters   Occupational History  . Retired, disable since 2000 Retired   Social History Main Topics  . Smoking status: Former Smoker    Packs/day: 0.25    Years: 5.00    Types: Cigarettes    Quit date: 01/29/1998  . Smokeless tobacco: Never Used     Comment: Quit in 2001  . Alcohol use  No  . Drug use: No  . Sexual activity: No   Other Topics Concern  . None   Social History Narrative   On disability since 2000--- also husband has MS   Education. College.   Right handed.     Family History:  The patient's family history includes Allergies in her sister; Aneurysm in her brother; Asthma in her paternal grandmother and sister; Diabetes in her brother and brother; Emphysema in her brother; Heart disease in her brother, father, and mother; Kidney failure in her brother; Lung cancer in her mother; Parkinsonism in her sister; Stroke in her brother, maternal grandmother, and paternal grandmother.     ROS:   Please see the history of present illness.    ROS All other systems reviewed and are negative.   PHYSICAL EXAM:   VS:  BP 138/84   Pulse 80   Ht 5\' 3"  (1.6 m)   Wt 197 lb 12.8 oz (89.7 kg)   SpO2 98%   BMI 35.04 kg/m    GEN: Well nourished, well developed, in no acute distress, obese HEENT: normal  Neck: no JVD, carotid bruits, or masses Cardiac: RRR; no murmurs, rubs, or gallops,no edema  Respiratory:  clear to auscultation bilaterally, normal work of breathing GI: soft, nontender, nondistended, + BS MS: no deformity or atrophy  Skin: warm and dry, no rash Neuro:  Alert and Oriented x 3, Strength and sensation are intact Psych: euthymic mood, full affect   Wt Readings from Last 3 Encounters:  03/21/16 197 lb 12.8 oz (89.7 kg)  02/29/16 201 lb (91.2 kg)  02/23/16 195 lb 3.2 oz (88.5 kg)      Studies/Labs Reviewed:   EKG:  EKG is ordered today.  The ekg ordered today demonstrates NSR with 1st degree AV block. Difficult to interpret given baseline wander  Recent Labs:  07/25/2015: Brain Natriuretic Peptide 31.8; Hemoglobin 12.9; Platelets 142; TSH 2.22 10/19/2015: ALT 18; BUN 20; Creatinine, Ser 0.93; Potassium 3.9; Sodium 141   Lipid Panel    Component Value Date/Time   CHOL 169 07/06/2015 0916   TRIG 109 07/06/2015 0916   HDL 74 07/06/2015 0916    CHOLHDL 2.3 07/06/2015 0916   VLDL 22 07/06/2015 0916   LDLCALC 73 07/06/2015 0916   LDLDIRECT 126.2 12/07/2010 1039    Additional studies/ records that were reviewed today include:  Summarized above   ASSESSMENT & PLAN:    CAD/chest pain: known moderate LAD disease by cardiac CT. Low risk nuclear study 07/2015. ECG with no acute ST or TW changes. With ongoing chest pain will set up for LHC. I will also start imdur 30mg  daily. Continue plavix (on for TIA), statin and BB. Will get pre cath labs.   I have reviewed the risks, indications, and alternatives to cardiac catheterization and possible angioplasty/stenting with the patient. Risks include but are not limited to bleeding, infection, vascular injury, stroke, myocardial infection, arrhythmia, kidney injury, radiation-related injury in the case of prolonged fluoroscopy use, emergency cardiac surgery, and death. The patient understands the risks of serious complication is low (123456).    HTN: BP well controlled today  Chronic diastolic CHF: appears euvolemic today. Continue lasix 20mg  daily   HLD: continue statin.   Diabetes: controlled. HgA1c 6.5. Follow up with PCP  Colon CA: currently in remission.   Hx of TIA: continue plavix and statin   Medication Adjustments/Labs and Tests Ordered: Current medicines are reviewed at length with the patient today.  Concerns regarding medicines are outlined above.  Medication changes, Labs and Tests ordered today are listed in the Patient Instructions below. Patient Instructions  Medication Instructions:  Your physician recommends that you continue on your current medications as directed. Please refer to the Current Medication list given to you today.   Labwork: TODAY:  BMET, CBC, & PT/INRE  Testing/Procedures: Your physician has requested that you have a cardiac catheterization. Cardiac catheterization is used to diagnose and/or treat various heart conditions. Doctors may recommend this  procedure for a number of different reasons. The most common reason is to evaluate chest pain. Chest pain can be a symptom of coronary artery disease (CAD), and cardiac catheterization can show whether plaque is narrowing or blocking your heart's arteries. This procedure is also used to evaluate the valves, as well as measure the blood flow and oxygen levels in different parts of your heart. For further information please visit [email or web address]. Please follow instruction sheet, as given.    Follow-Up: Your physician recommends that you schedule a follow-up appointment in: WILL BE SET UP AT DISCHARGE   Any Other Special Instructions Will Be Listed Below (If Applicable).   Coronary Angiogram With Stent Coronary angiogram with stent placement is a procedure to widen or open a narrow blood vessel of the heart (coronary artery). Arteries may become blocked by cholesterol buildup (plaques) in the lining or wall. When a coronary artery becomes partially blocked, blood flow to that area decreases. This may lead to chest pain or a heart attack (myocardial infarction). A stent is a small piece of metal that looks like mesh or a spring. Stent placement may be done as treatment for a heart attack or right after a coronary angiogram in which a blocked artery is found. Let your health care provider know about:  Any allergies you have.  All medicines you are taking, including vitamins, herbs, eye  drops, creams, and over-the-counter medicines.  Any problems you or family members have had with anesthetic medicines.  Any blood disorders you have.  Any surgeries you have had.  Any medical conditions you have.  Whether you are pregnant or may be pregnant. What are the risks? Generally, this is a safe procedure. However, problems may occur, including:  Damage to the heart or its blood vessels.  A return of blockage.  Bleeding, infection, or bruising at the insertion site.  A collection of blood  under the skin (hematoma) at the insertion site.  A blood clot in another part of the body.  Kidney injury.  Allergic reaction to the dye or contrast that is used.  Bleeding into the abdomen (retroperitoneal bleeding). What happens before the procedure? Staying hydrated  Follow instructions from your health care provider about hydration, which may include:  Up to 2 hours before the procedure - you may continue to drink clear liquids, such as water, clear fruit juice, black coffee, and plain tea. Eating and drinking restrictions  Follow instructions from your health care provider about eating and drinking, which may include:  8 hours before the procedure - stop eating heavy meals or foods such as meat, fried foods, or fatty foods.  6 hours before the procedure - stop eating light meals or foods, such as toast or cereal.  2 hours before the procedure - stop drinking clear liquids. Ask your health care provider about:  Changing or stopping your regular medicines. This is especially important if you are taking diabetes medicines or blood thinners.  Taking medicines such as ibuprofen. These medicines can thin your blood. Do not take these medicines before your procedure if your health care provider instructs you not to. Generally, aspirin is recommended before a procedure of passing a small, thin tube (catheter) through a blood vessel and into the heart (cardiac catheterization). What happens during the procedure?  An IV tube will be inserted into one of your veins.  You will be given one or more of the following:  A medicine to help you relax (sedative).  A medicine to numb the area where the catheter will be inserted into an artery (local anesthetic).  To reduce your risk of infection:  Your health care team will wash or sanitize their hands.  Your skin will be washed with soap.  Hair may be removed from the area where the catheter will be inserted.  Using a guide wire, the  catheter will be inserted into an artery. The location may be in your groin, in your wrist, or in the fold of your arm (near your elbow).  A type of X-ray (fluoroscopy) will be used to help guide the catheter to the opening of the arteries in the heart.  A dye will be injected into the catheter, and X-rays will be taken. The dye will help to show where any narrowing or blockages are located in the arteries.  A tiny wire will be guided to the blocked spot, and a balloon will be inflated to make the artery wider.  The stent will be expanded and will crush the plaques into the wall of the vessel. The stent will hold the area open and improve the blood flow. Most stents have a drug coating to reduce the risk of the stent narrowing over time.  The artery may be made wider using a drill, laser, or other tools to remove plaques.  When the blood flow is better, the catheter will be removed. The  lining of the artery will grow over the stent, which stays where it was placed. This procedure may vary among health care providers and hospitals. What happens after the procedure?  If the procedure is done through the leg, you will be kept in bed lying flat for about 6 hours. You will be instructed to not bend and not cross your legs.  The insertion site will be checked frequently.  The pulse in your foot or wrist will be checked frequently.  You may have additional blood tests, X-rays, and a test that records the electrical activity of your heart (electrocardiogram, or ECG). This information is not intended to replace advice given to you by your health care provider. Make sure you discuss any questions you have with your health care provider. Document Released: 07/22/2002 Document Revised: 09/15/2015 Document Reviewed: 08/21/2015 Elsevier Interactive Patient Education  2017 [employer].  If you need a refill on your cardiac medications before your next appointment, please call your pharmacy.       Signed, Angelena Form, PA-C  03/21/2016 10:27 AM    [facility] [address], [place], [place]  [address] Phone: [phone]; Fax: [phone]

## 2016-03-20 NOTE — Telephone Encounter (Signed)
Patient is calling states that she is having more chest pressure than normal. She had an episode yesterday, she sweating and felt the chest pressure. Patient also states that BP is up and down. Patient would like to know what she needs to do. Thanks.

## 2016-03-21 ENCOUNTER — Ambulatory Visit (INDEPENDENT_AMBULATORY_CARE_PROVIDER_SITE_OTHER): Payer: Medicare Other | Admitting: Physician Assistant

## 2016-03-21 ENCOUNTER — Ambulatory Visit: Payer: Medicare Other

## 2016-03-21 ENCOUNTER — Encounter: Payer: Self-pay | Admitting: *Deleted

## 2016-03-21 ENCOUNTER — Encounter: Payer: Self-pay | Admitting: Physician Assistant

## 2016-03-21 VITALS — BP 138/84 | HR 80 | Ht 63.0 in | Wt 197.8 lb

## 2016-03-21 DIAGNOSIS — R0789 Other chest pain: Secondary | ICD-10-CM

## 2016-03-21 DIAGNOSIS — I2583 Coronary atherosclerosis due to lipid rich plaque: Secondary | ICD-10-CM | POA: Diagnosis not present

## 2016-03-21 DIAGNOSIS — I25119 Atherosclerotic heart disease of native coronary artery with unspecified angina pectoris: Secondary | ICD-10-CM

## 2016-03-21 DIAGNOSIS — E1140 Type 2 diabetes mellitus with diabetic neuropathy, unspecified: Secondary | ICD-10-CM | POA: Diagnosis not present

## 2016-03-21 DIAGNOSIS — R222 Localized swelling, mass and lump, trunk: Secondary | ICD-10-CM | POA: Diagnosis not present

## 2016-03-21 DIAGNOSIS — R262 Difficulty in walking, not elsewhere classified: Secondary | ICD-10-CM | POA: Diagnosis not present

## 2016-03-21 DIAGNOSIS — E782 Mixed hyperlipidemia: Secondary | ICD-10-CM

## 2016-03-21 DIAGNOSIS — I10 Essential (primary) hypertension: Secondary | ICD-10-CM | POA: Diagnosis not present

## 2016-03-21 DIAGNOSIS — M6281 Muscle weakness (generalized): Secondary | ICD-10-CM | POA: Diagnosis not present

## 2016-03-21 DIAGNOSIS — I2510 Atherosclerotic heart disease of native coronary artery without angina pectoris: Secondary | ICD-10-CM

## 2016-03-21 DIAGNOSIS — I5032 Chronic diastolic (congestive) heart failure: Secondary | ICD-10-CM

## 2016-03-21 DIAGNOSIS — I209 Angina pectoris, unspecified: Secondary | ICD-10-CM

## 2016-03-21 LAB — BASIC METABOLIC PANEL
BUN/Creatinine Ratio: 20 (ref 12–28)
BUN: 18 mg/dL (ref 8–27)
CALCIUM: 9.1 mg/dL (ref 8.7–10.3)
CO2: 23 mmol/L (ref 18–29)
CREATININE: 0.89 mg/dL (ref 0.57–1.00)
Chloride: 100 mmol/L (ref 96–106)
GFR, EST AFRICAN AMERICAN: 74 (ref >59)
GFR, EST NON AFRICAN AMERICAN: 65 (ref >59)
Glucose: 120 mg/dL — ABNORMAL HIGH (ref 65–99)
POTASSIUM: 4.4 mmol/L (ref 3.5–5.2)
Sodium: 140 mmol/L (ref 134–144)

## 2016-03-21 LAB — PROTIME-INR
INR: 1 (ref 0.8–1.2)
Prothrombin Time: 10.5 s (ref 9.1–12.0)

## 2016-03-21 LAB — CBC
HEMOGLOBIN: 12.7 g/dL (ref 11.1–15.9)
Hematocrit: 37.4 % (ref 34.0–46.6)
MCH: 28.2 pg (ref 26.6–33.0)
MCHC: 34 g/dL (ref 31.5–35.7)
MCV: 83 fL (ref 79–97)
Platelets: 145 10*3/uL — ABNORMAL LOW (ref 150–379)
RBC: 4.5 x10E6/uL (ref 3.77–5.28)
RDW: 14.6 % (ref 12.3–15.4)
WBC: 3.7 10*3/uL (ref 3.4–10.8)

## 2016-03-21 NOTE — Addendum Note (Signed)
Addended by: Eulis Foster on: 03/21/2016 10:43 AM   Modules accepted: Orders

## 2016-03-21 NOTE — Therapy (Signed)
[place], [place], [address] Phone: [phone]   Fax:  [phone]  Physical Therapy Treatment  Patient Details  Name: Samantha [name] MRN: [id number] Date of Birth: September 20, 1940 Referring Provider: Dr. Marcello Moores  Encounter Date: 03/21/2016      PT End of Session - 03/21/16 1149    Visit Number 17  add  KX   Number of Visits 24   Date for PT Re-Evaluation 04/27/16   PT Start Time 1111  Pt arrived late from cardiologist   PT Stop Time 1149   PT Time Calculation (min) 38 min   Activity Tolerance Patient tolerated treatment well   Behavior During Therapy [facility] for tasks assessed/performed      Past Medical History:  Diagnosis Date  . Abnormal CT of the chest 2008   last CT4-l 2009:  . No f/u suggested   . Allergy   . Asthma   . CAD (coronary artery disease)    a. Coronary CTA 10/16: Coronary Ca score 211, mod non-obstructive CAD with LM mild plaque (25-50%), mid LAD 50-69%. b. Neg nuc 06/2015.  . Cataract    BILATERAL-REMOVED  . Chronic diastolic CHF (congestive heart failure) ([place])   . Collagen vascular disease ([place])    "arterial sclerosis" per pt  . Complication of anesthesia    trouble waking up  . Fibromyalgia   . GERD (gastroesophageal reflux disease)   . H/O hiatal hernia   . Heart murmur   . Hyperlipidemia   . Hypertension   . Malignant neoplasm of ascending colon ([place]) 2016   Minimally invasive right hemicolectomy to be done   . Neuromuscular disorder (HCC)    FIBROMYALGIA  . Ocular migraine   . OSA (obstructive sleep apnea) 09/2007   dx w/ a sleep study, not on  CPAP  . Osteoarthritis   . Osteoporosis   . Pneumonia    "double" in 2004  . PONV (postoperative nausea and vomiting)   . Reactive airway disease 01/29/2002   dx of pseudoasthma / vcd in 2005 and nl sprirometry History of dyspnea, 2011,  improved after several medications were changed around Question of COPD, disproved  July 06, 2009 with nl pft's      . Rheumatoid factor positive   . Shingles 11/2009  . Sleep apnea   . Stroke ([place])   . TIA (transient ischemic attack)    x2 - on Plavix for this  . Torn rotator cuff    right worse than left, both are torn  . Tumor, 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  . Type II diabetes mellitus ([place])   . 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 Vaginal dysplasia     Past Surgical History:  Procedure Laterality Date  . ABDOMINAL HYSTERECTOMY  1980   NO oophorectomy per pt   . ANTERIOR CERVICAL DECOMP/DISCECTOMY FUSION  2001   C 3, C4 and C5 plate and screws  . BREAST BIOPSY Right 1999  . BUNIONECTOMY Left ~ 1977  . CATARACT EXTRACTION W/ INTRAOCULAR LENS  IMPLANT, BILATERAL  2012  . COLON SURGERY  10/2014  . EYE SURGERY Bilateral    torq lens for cataracts  . THYROIDECTOMY, PARTIAL  1960's  . VAGINAL MASS EXCISION  1994   "Laser surgery for vaginal cancer; followed by chemotherapy" (06/27/2012)    There were no vitals filed for this visit.      Subjective Assessment - 03/21/16 1120    Subjective  I just left the cardiologists office. I due to my chest pains with pressure and sweating have occurred a few times over the past week. They are going to do a cardiac catherization tomorrow morning. So this was my last appt but it needs to be now.     Pertinent History type 2 diabetes, vaginal cancer, colon cancer, rheumatoid factor positive, TIA, fibromyalgia, congestive heart failure, pt reports R knee needs surgery   Patient Stated Goals to get rid of the soreness in my abdomen so I can function better   Currently in Pain? No/denies                         [facility] Adult PT Treatment/Exercise - 03/21/16 0001      Manual Therapy   Myofascial Release To anterior trunk horizontally and vertically with light pressure and to patients tolerance at surrounding area to incision and on incision   Manual Lymphatic Drainage (MLD) In supine:  Short neck and  superficial abdomen; Lt axilla and Rt inguinal nodes,  Lt inguino-axillary anterior inter-inguinal anastomosis; right abdomen directing toward right anastomosis, then left abdomen toward anterior anastomosis                        Long Term Clinic Goals - 03/21/16 1116      CC Long Term Goal  #1   Title Pt will report a 50% improvement in abdominal pain when bending forward to allow her complete ADLs   Baseline 02/03/16- 25% improvement, 02/20/16- a little over 50% improvement; 99% improvement-03/21/16   Status Achieved     CC Long Term Goal  #2   Title Pt will demonstrate R quad strength of 4/5 to decrease risk of falls    Baseline 3+/5, 02/20/16- 3+/5, 03/08/16- 3+/5; 03/19/16 still 3+/5; pt reports her Rt leg feels stronger, it's just her Rt knee pain that is her primary issue now   Status Partially Met     CC Long Term Goal  #3   Title Pt will be independent in nerve desensitization technique for long term management of pain   Baseline 02/20/16- pt is independent with this   Status Achieved     CC Long Term Goal  #4   Title Pt will be independent in a home exercise program for continued strengthening   Status Achieved            Plan - 03/21/16 1151    Clinical Impression Statement Pt is having a cardiac catherization tomorrow and she reports they will tell her after that if she needs a heart bypass. She was instructed to not do anything physically exerting today so only performed manual therapy for treatment today. She is pleased with her progress at this time and wants to D/C as she needs to have this procedure and possibly more upcoming. Pt has met most goals.   Rehab Potential Good   Clinical Impairments Affecting Rehab Potential numerous comorbidities; healing L1 compression fracture    PT Frequency 2x / week   PT Duration 4 weeks   PT Treatment/Interventions ADLs/Self Care Home Management;Therapeutic exercise;Patient/family education;Manual techniques;Manual  lymph drainage;Scar mobilization   PT Next Visit Plan D/C this visit.    PT Home Exercise Plan Meeks decompression exercises, pelvic tilts   Consulted and Agree with Plan of Care Patient      Patient will benefit from skilled therapeutic intervention in order to improve the following deficits and impairments:  Pain, Decreased strength, Increased edema, Difficulty walking  Visit Diagnosis: Localized swelling, mass and lump, trunk     Problem List Patient Active Problem List   Diagnosis Date Noted  . Spondylosis of lumbar region without myelopathy or radiculopathy 02/02/2016  . CAD in native artery 08/22/2015  . Hypertensive heart disease 07/07/2015  . Essential hypertension 07/07/2015  . SOB (shortness of breath) 07/07/2015  . Dizziness 07/07/2015  . Plantar fasciitis, left 03/22/2015  . Hx of colon cancer, stage I 01/11/2015  . Coronary artery disease due to lipid rich plaque 01/05/2015  . Chronic diastolic CHF (congestive heart failure), NYHA class 2 ([place]) 01/05/2015  . 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 Migraine (Ocular) 01/05/2014  . Gait difficulty 10/06/2013  . Pain in joint, shoulder region 11/27/2012  . VBI (vertebrobasilar insufficiency) 08/22/2012  . Right knee pain 07/29/2012  . TIA (transient ischemic attack) 06/27/2012  . Edema 02/01/2012  . DJD (degenerative joint disease) 02/02/2011  . 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 Postherpetic neuralgia ? 01/02/2010  . NECK PAIN 10/05/2009  . Palpitations 11/23/2008  . UTI'S, RECURRENT 09/28/2008  . Fibromyalgia 08/15/2007  . FATIGUE 11/19/2006  . 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 Hyperlipidemia 05/21/2006  . Reactive airway disease 01/29/2002    Otelia Limes, PTA 03/21/2016, 12:08 PM  [place], [place], [address] Phone:  [phone]   Fax:  [phone]  Name: Samantha [name] MRN: [id number] Date of Birth: 1940/11/16  PHYSICAL THERAPY DISCHARGE SUMMARY  Visits from Start of Care: 17  Current functional level related to goals / functional outcomes: Goals met for the most part, as noted above.   Remaining deficits: Still with decreased right quad strength and with knee pain.   Education / Equipment: HEP Plan: Patient agrees to discharge.  Patient goals were partially met. Patient is being discharged due to a change in medical status.  ?????    She just saw her cardiologist.  She will  Have catheterization tomorrow and may have surgery subsequent to that.  Serafina Royals, PT 03/21/16 12:26 PM

## 2016-03-21 NOTE — Patient Instructions (Addendum)
Medication Instructions:  Your physician recommends that you continue on your current medications as directed. Please refer to the Current Medication list given to you today.   Labwork: TODAY:  BMET, CBC, & PT/INRE  Testing/Procedures: Your physician has requested that you have a cardiac catheterization. Cardiac catheterization is used to diagnose and/or treat various heart conditions. Doctors may recommend this procedure for a number of different reasons. The most common reason is to evaluate chest pain. Chest pain can be a symptom of coronary artery disease (CAD), and cardiac catheterization can show whether plaque is narrowing or blocking your heart's arteries. This procedure is also used to evaluate the valves, as well as measure the blood flow and oxygen levels in different parts of your heart. For further information please visit [email or web address]. Please follow instruction sheet, as given.    Follow-Up: Your physician recommends that you schedule a follow-up appointment in: WILL BE SET UP AT DISCHARGE   Any Other Special Instructions Will Be Listed Below (If Applicable).   Coronary Angiogram With Stent Coronary angiogram with stent placement is a procedure to widen or open a narrow blood vessel of the heart (coronary artery). Arteries may become blocked by cholesterol buildup (plaques) in the lining or wall. When a coronary artery becomes partially blocked, blood flow to that area decreases. This may lead to chest pain or a heart attack (myocardial infarction). A stent is a small piece of metal that looks like mesh or a spring. Stent placement may be done as treatment for a heart attack or right after a coronary angiogram in which a blocked artery is found. Let your health care provider know about:  Any allergies you have.  All medicines you are taking, including vitamins, herbs, eye drops, creams, and over-the-counter medicines.  Any problems you or family members have had with  anesthetic medicines.  Any blood disorders you have.  Any surgeries you have had.  Any medical conditions you have.  Whether you are pregnant or may be pregnant. What are the risks? Generally, this is a safe procedure. However, problems may occur, including:  Damage to the heart or its blood vessels.  A return of blockage.  Bleeding, infection, or bruising at the insertion site.  A collection of blood under the skin (hematoma) at the insertion site.  A blood clot in another part of the body.  Kidney injury.  Allergic reaction to the dye or contrast that is used.  Bleeding into the abdomen (retroperitoneal bleeding). What happens before the procedure? Staying hydrated  Follow instructions from your health care provider about hydration, which may include:  Up to 2 hours before the procedure - you may continue to drink clear liquids, such as water, clear fruit juice, black coffee, and plain tea. Eating and drinking restrictions  Follow instructions from your health care provider about eating and drinking, which may include:  8 hours before the procedure - stop eating heavy meals or foods such as meat, fried foods, or fatty foods.  6 hours before the procedure - stop eating light meals or foods, such as toast or cereal.  2 hours before the procedure - stop drinking clear liquids. Ask your health care provider about:  Changing or stopping your regular medicines. This is especially important if you are taking diabetes medicines or blood thinners.  Taking medicines such as ibuprofen. These medicines can thin your blood. Do not take these medicines before your procedure if your health care provider instructs you not to. Generally, aspirin is recommended  before a procedure of passing a small, thin tube (catheter) through a blood vessel and into the heart (cardiac catheterization). What happens during the procedure?  An IV tube will be inserted into one of your veins.  You will  be given one or more of the following:  A medicine to help you relax (sedative).  A medicine to numb the area where the catheter will be inserted into an artery (local anesthetic).  To reduce your risk of infection:  Your health care team will wash or sanitize their hands.  Your skin will be washed with soap.  Hair may be removed from the area where the catheter will be inserted.  Using a guide wire, the catheter will be inserted into an artery. The location may be in your groin, in your wrist, or in the fold of your arm (near your elbow).  A type of X-ray (fluoroscopy) will be used to help guide the catheter to the opening of the arteries in the heart.  A dye will be injected into the catheter, and X-rays will be taken. The dye will help to show where any narrowing or blockages are located in the arteries.  A tiny wire will be guided to the blocked spot, and a balloon will be inflated to make the artery wider.  The stent will be expanded and will crush the plaques into the wall of the vessel. The stent will hold the area open and improve the blood flow. Most stents have a drug coating to reduce the risk of the stent narrowing over time.  The artery may be made wider using a drill, laser, or other tools to remove plaques.  When the blood flow is better, the catheter will be removed. The lining of the artery will grow over the stent, which stays where it was placed. This procedure may vary among health care providers and hospitals. What happens after the procedure?  If the procedure is done through the leg, you will be kept in bed lying flat for about 6 hours. You will be instructed to not bend and not cross your legs.  The insertion site will be checked frequently.  The pulse in your foot or wrist will be checked frequently.  You may have additional blood tests, X-rays, and a test that records the electrical activity of your heart (electrocardiogram, or ECG). This information is  not intended to replace advice given to you by your health care provider. Make sure you discuss any questions you have with your health care provider. Document Released: 07/22/2002 Document Revised: 09/15/2015 Document Reviewed: 08/21/2015 Elsevier Interactive Patient Education  2017 [employer].  If you need a refill on your cardiac medications before your next appointment, please call your pharmacy.

## 2016-03-22 ENCOUNTER — Ambulatory Visit (HOSPITAL_COMMUNITY)
Admission: RE | Admit: 2016-03-22 | Discharge: 2016-03-22 | Disposition: A | Discharge status: Home / Self Care | Payer: Medicare Other | Source: Ambulatory Visit | Attending: Interventional Cardiology | Admitting: Interventional Cardiology

## 2016-03-22 ENCOUNTER — Encounter (HOSPITAL_COMMUNITY)
Admission: RE | Disposition: A | Discharge status: Home / Self Care | Payer: Self-pay | Source: Ambulatory Visit | Attending: Interventional Cardiology

## 2016-03-22 ENCOUNTER — Encounter (HOSPITAL_COMMUNITY): Payer: Self-pay | Admitting: Interventional Cardiology

## 2016-03-22 DIAGNOSIS — R0789 Other chest pain: Secondary | ICD-10-CM

## 2016-03-22 DIAGNOSIS — G43B0 Ophthalmoplegic migraine, not intractable: Secondary | ICD-10-CM | POA: Diagnosis not present

## 2016-03-22 DIAGNOSIS — K219 Gastro-esophageal reflux disease without esophagitis: Secondary | ICD-10-CM | POA: Diagnosis not present

## 2016-03-22 DIAGNOSIS — M1990 Unspecified osteoarthritis, unspecified site: Secondary | ICD-10-CM | POA: Diagnosis not present

## 2016-03-22 DIAGNOSIS — Z7984 Long term (current) use of oral hypoglycemic drugs: Secondary | ICD-10-CM | POA: Insufficient documentation

## 2016-03-22 DIAGNOSIS — I493 Ventricular premature depolarization: Secondary | ICD-10-CM | POA: Diagnosis not present

## 2016-03-22 DIAGNOSIS — R072 Precordial pain: Secondary | ICD-10-CM | POA: Diagnosis present

## 2016-03-22 DIAGNOSIS — G4733 Obstructive sleep apnea (adult) (pediatric): Secondary | ICD-10-CM | POA: Diagnosis not present

## 2016-03-22 DIAGNOSIS — E119 Type 2 diabetes mellitus without complications: Secondary | ICD-10-CM | POA: Diagnosis not present

## 2016-03-22 DIAGNOSIS — Z7902 Long term (current) use of antithrombotics/antiplatelets: Secondary | ICD-10-CM | POA: Diagnosis not present

## 2016-03-22 DIAGNOSIS — Z85038 Personal history of other malignant neoplasm of large intestine: Secondary | ICD-10-CM | POA: Diagnosis not present

## 2016-03-22 DIAGNOSIS — I110 Hypertensive heart disease with heart failure: Secondary | ICD-10-CM | POA: Diagnosis not present

## 2016-03-22 DIAGNOSIS — J45909 Unspecified asthma, uncomplicated: Secondary | ICD-10-CM | POA: Diagnosis not present

## 2016-03-22 DIAGNOSIS — K449 Diaphragmatic hernia without obstruction or gangrene: Secondary | ICD-10-CM | POA: Insufficient documentation

## 2016-03-22 DIAGNOSIS — M797 Fibromyalgia: Secondary | ICD-10-CM | POA: Diagnosis not present

## 2016-03-22 DIAGNOSIS — I5032 Chronic diastolic (congestive) heart failure: Secondary | ICD-10-CM | POA: Diagnosis present

## 2016-03-22 DIAGNOSIS — I209 Angina pectoris, unspecified: Secondary | ICD-10-CM

## 2016-03-22 DIAGNOSIS — Z8673 Personal history of transient ischemic attack (TIA), and cerebral infarction without residual deficits: Secondary | ICD-10-CM | POA: Diagnosis not present

## 2016-03-22 DIAGNOSIS — M810 Age-related osteoporosis without current pathological fracture: Secondary | ICD-10-CM | POA: Diagnosis not present

## 2016-03-22 DIAGNOSIS — E785 Hyperlipidemia, unspecified: Secondary | ICD-10-CM | POA: Insufficient documentation

## 2016-03-22 DIAGNOSIS — Z87891 Personal history of nicotine dependence: Secondary | ICD-10-CM | POA: Diagnosis not present

## 2016-03-22 DIAGNOSIS — I2510 Atherosclerotic heart disease of native coronary artery without angina pectoris: Secondary | ICD-10-CM | POA: Diagnosis not present

## 2016-03-22 HISTORY — PX: LEFT HEART CATH AND CORONARY ANGIOGRAPHY: CATH118249

## 2016-03-22 LAB — GLUCOSE, CAPILLARY: GLUCOSE-CAPILLARY: 129 mg/dL — AB (ref 65–99)

## 2016-03-22 SURGERY — LEFT HEART CATH AND CORONARY ANGIOGRAPHY

## 2016-03-22 MED ORDER — VERAPAMIL HCL 2.5 MG/ML IV SOLN
INTRAVENOUS | Status: AC
  Filled 2016-03-22: qty 2

## 2016-03-22 MED ORDER — LIDOCAINE HCL (PF) 1 % IJ SOLN
INTRAMUSCULAR | Status: AC
  Filled 2016-03-22: qty 30

## 2016-03-22 MED ORDER — IOPAMIDOL (ISOVUE-370) INJECTION 76%
INTRAVENOUS | Status: AC
  Filled 2016-03-22: qty 100

## 2016-03-22 MED ORDER — SODIUM CHLORIDE 0.9% FLUSH: 3.0000 mL | Freq: Two times a day (BID) | INTRAVENOUS | Status: DC

## 2016-03-22 MED ORDER — SODIUM CHLORIDE 0.9 % IV SOLN: 250.0000 mL | INTRAVENOUS | Status: DC | PRN

## 2016-03-22 MED ORDER — IOPAMIDOL (ISOVUE-370) INJECTION 76%
INTRAVENOUS | Status: DC | PRN
Start: 2016-03-22 — End: 2016-03-22
  Administered 2016-03-22: 80 mL via INTRA_ARTERIAL

## 2016-03-22 MED ORDER — ACETAMINOPHEN 325 MG PO TABS: 650.0000 mg | ORAL_TABLET | ORAL | Status: DC | PRN

## 2016-03-22 MED ORDER — HEPARIN (PORCINE) IN NACL 2-0.9 UNIT/ML-% IJ SOLN
INTRAMUSCULAR | Status: AC
  Filled 2016-03-22: qty 500

## 2016-03-22 MED ORDER — HEPARIN SODIUM (PORCINE) 1000 UNIT/ML IJ SOLN
INTRAMUSCULAR | Status: DC | PRN
  Administered 2016-03-22: 4500 [IU] via INTRAVENOUS

## 2016-03-22 MED ORDER — HEPARIN (PORCINE) IN NACL 2-0.9 UNIT/ML-% IJ SOLN
INTRAMUSCULAR | Status: DC | PRN
  Administered 2016-03-22: 1000 mL

## 2016-03-22 MED ORDER — HEPARIN SODIUM (PORCINE) 1000 UNIT/ML IJ SOLN
INTRAMUSCULAR | Status: AC
  Filled 2016-03-22: qty 1

## 2016-03-22 MED ORDER — FENTANYL CITRATE (PF) 100 MCG/2ML IJ SOLN
INTRAMUSCULAR | Status: AC
  Filled 2016-03-22: qty 2

## 2016-03-22 MED ORDER — SODIUM CHLORIDE 0.9 % WEIGHT BASED INFUSION
3.0000 mL/kg/h | INTRAVENOUS | Status: DC
  Administered 2016-03-22: 3 mL/kg/h via INTRAVENOUS

## 2016-03-22 MED ORDER — FENTANYL CITRATE (PF) 100 MCG/2ML IJ SOLN
INTRAMUSCULAR | Status: DC | PRN
  Administered 2016-03-22: 50 ug via INTRAVENOUS

## 2016-03-22 MED ORDER — MIDAZOLAM HCL 2 MG/2ML IJ SOLN
INTRAMUSCULAR | Status: DC | PRN
  Administered 2016-03-22: 1 mg via INTRAVENOUS

## 2016-03-22 MED ORDER — SODIUM CHLORIDE 0.9% FLUSH: 3.0000 mL | INTRAVENOUS | Status: DC | PRN

## 2016-03-22 MED ORDER — SODIUM CHLORIDE 0.9 % IV SOLN: INTRAVENOUS | Status: DC

## 2016-03-22 MED ORDER — SODIUM CHLORIDE 0.9 % WEIGHT BASED INFUSION: 1.0000 mL/kg/h | INTRAVENOUS | Status: DC

## 2016-03-22 MED ORDER — MIDAZOLAM HCL 2 MG/2ML IJ SOLN
INTRAMUSCULAR | Status: AC
  Filled 2016-03-22: qty 2

## 2016-03-22 MED ORDER — ONDANSETRON HCL 4 MG/2ML IJ SOLN: 4.0000 mg | Freq: Four times a day (QID) | INTRAMUSCULAR | Status: DC | PRN

## 2016-03-22 MED ORDER — ASPIRIN 81 MG PO CHEW
81.0000 mg | CHEWABLE_TABLET | ORAL | Status: AC
  Administered 2016-03-22: 81 mg via ORAL

## 2016-03-22 MED ORDER — ASPIRIN 81 MG PO CHEW
CHEWABLE_TABLET | ORAL | Status: AC
  Filled 2016-03-22: qty 1

## 2016-03-22 MED ORDER — VERAPAMIL HCL 2.5 MG/ML IV SOLN
INTRAVENOUS | Status: DC | PRN
  Administered 2016-03-22: 10 mL via INTRA_ARTERIAL

## 2016-03-22 SURGICAL SUPPLY — 12 items
CATH EXPO 5F FL3.5 (CATHETERS) ×1 IMPLANT
CATH EXPO 5FR FR4 (CATHETERS) ×1 IMPLANT
COVER PRB 48X5XTLSCP FOLD TPE (BAG) IMPLANT
COVER PROBE 5X48 (BAG) ×2
DEVICE RAD COMP TR BAND LRG (VASCULAR PRODUCTS) ×1 IMPLANT
GLIDESHEATH SLEND A-KIT 6F 22G (SHEATH) ×1 IMPLANT
GUIDEWIRE INQWIRE 1.5J.035X260 (WIRE) IMPLANT
INQWIRE 1.5J .035X260CM (WIRE) ×2
KIT HEART LEFT (KITS) ×2 IMPLANT
PACK CARDIAC CATHETERIZATION (CUSTOM PROCEDURE TRAY) ×2 IMPLANT
TRANSDUCER W/STOPCOCK (MISCELLANEOUS) ×2 IMPLANT
TUBING CIL FLEX 10 FLL-RA (TUBING) ×2 IMPLANT

## 2016-03-22 NOTE — Interval H&P Note (Signed)
Cath Lab Visit (complete for each Cath Lab visit)  Clinical Evaluation Leading to the Procedure:   ACS: No.  Non-ACS:    Anginal Classification: CCS [name]  Anti-ischemic medical therapy: Maximal Therapy (2 or more classes of medications)  Non-Invasive Test Results: Low-risk stress test findings: cardiac mortality <1%/year  Prior CABG: No previous CABG      History and Physical Interval Note:  03/22/2016 7:25 AM  [place]  has presented today for surgery, with the diagnosis of cp  The various methods of treatment have been discussed with the patient and family. After consideration of risks, benefits and other options for treatment, the patient has consented to  Procedure(s): Left Heart Cath and Coronary Angiography (N/A) as a surgical intervention .  The patient's history has been reviewed, patient examined, no change in status, stable for surgery.  I have reviewed the patient's chart and labs.  Questions were answered to the patient's satisfaction.     Samantha [name]

## 2016-03-22 NOTE — Discharge Instructions (Signed)

## 2016-03-22 NOTE — H&P (View-Only) (Signed)
Cardiology Office Note    Date:  03/21/2016   ID:  Samantha [name], Samantha [name] 10-05-1940, MRN [id number]  PCP:  [name], MD  Cardiologist:  [name]  CC: chest pressure  History of Present Illness:  Samantha [name] is a 76 y.o. female with a history of CAD (moderate nonobstructive disease by cardiac CT 10/2014), TIA (on Plavix), chronic diastolic CHF, DM2, HTN, HL, fibromyalgia, palpitations secondary to symptomatic PVCs, LE edema, colon cancer (s/p lap partial colectomy 10/2014), reactive airway disease who presents to clinic for evaluation of chest pressure.  Per review of chart, she has a history of CP in 2016 felt MSK but nuclear stress test was abnormal. She underwent cardiac CT 10/2014 showing 50-69% in the prox and mid LAD. She went on to have unremarkable colon surgery. 2D echo 09/2014: focal basal and moderate concentric hypertrophy, EF 60-65%, grade 1 DD. She saw [name] on 07/07/15 with complaints of dizziness and SOB. This began after remodeling her home with removal of carpet and a lot of dust. EKG was unchanged from prior. BP was 124/68. [name] recommended holding hydralazine and reassessing for need for further testing. CMET 6/7 unremarkable, LDL 73. I saw her in clinic 07/25/15 at which time she was reporting similar sx. Hgb was stable and BNP was negative. CTA neg for PE or other acute disease, no change. Nuc 08/03/15 was normal.   She was last seen by [name] in 01/2016 for follow up. She as doing well with no complaints. Her sx were felt related to reactive airway disease and if they recurred escalation of pulmonary regimen recommended.   Today she presents to clinic for follow up. Has been having intermittent chest pain since last Monday. She noted it first when she was at PT doing leg raises. She had pressure in her chest associated with SOB and dizziness. It radiates to her shoulder blade and left arm. Chest pain has occurred intermittently since that time mostly  with exertion. It goes away with with SL NTG and ASA. She almost had to call 911 the other day it was so severe. She has chronic mild  LE edema. She sleeps on 2-3 pillows chronically. No PND. No dizziness or syncope.   Past Medical History:  Diagnosis Date  . Abnormal CT of the chest 2008   last CT4-l 2009:  . No f/u suggested   . Allergy   . Asthma   . CAD (coronary artery disease)    a. Coronary CTA 10/16: Coronary Ca score 211, mod non-obstructive CAD with LM mild plaque (25-50%), mid LAD 50-69%. b. Neg nuc 06/2015.  . Cataract    BILATERAL-REMOVED  . Chronic diastolic CHF (congestive heart failure) ([place])   . Collagen vascular disease ([place])    "arterial sclerosis" per pt  . Complication of anesthesia    trouble waking up  . Fibromyalgia   . GERD (gastroesophageal reflux disease)   . H/O hiatal hernia   . Heart murmur   . Hyperlipidemia   . Hypertension   . Malignant neoplasm of ascending colon ([place]) 2016   Minimally invasive right hemicolectomy to be done   . Neuromuscular disorder ([facility])    FIBROMYALGIA  . Ocular migraine   . OSA (obstructive sleep apnea) 09/2007   dx w/ a sleep study, not on  CPAP  . Osteoarthritis   . Osteoporosis   . Pneumonia    "double" in 2004  . PONV (postoperative nausea and vomiting)   .  Reactive airway disease 01/29/2002   dx of pseudoasthma / vcd in 2005 and nl sprirometry History of dyspnea, 2011,  improved after several medications were changed around Question of COPD, disproved July 06, 2009 with nl pft's      . Rheumatoid factor positive   . Shingles 11/2009  . Sleep apnea   . Stroke ([place])   . TIA (transient ischemic attack)    x2 - on Plavix for this  . Torn rotator cuff    right worse than left, both are torn  . Tumor, 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  . Type II diabetes mellitus ([place])   . 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 Vaginal dysplasia     Past Surgical History:  Procedure Laterality Date  . ABDOMINAL HYSTERECTOMY  1980    NO oophorectomy per pt   . ANTERIOR CERVICAL DECOMP/DISCECTOMY FUSION  2001   C 3, C4 and C5 plate and screws  . BREAST BIOPSY Right 1999  . BUNIONECTOMY Left ~ 1977  . CATARACT EXTRACTION W/ INTRAOCULAR LENS  IMPLANT, BILATERAL  2012  . COLON SURGERY  10/2014  . EYE SURGERY Bilateral    torq lens for cataracts  . THYROIDECTOMY, PARTIAL  1960's  . VAGINAL MASS EXCISION  1994   "Laser surgery for vaginal cancer; followed by chemotherapy" (06/27/2012)    Current Medications: Outpatient Medications Prior to Visit  Medication Sig Dispense Refill  . acetaminophen (TYLENOL) 500 MG tablet Take 1,000 mg by mouth every 6 (six) hours as needed for moderate pain.    [name] albuterol (PROVENTIL HFA;VENTOLIN HFA) 108 (90 Base) MCG/ACT inhaler Inhale 2 puffs into the lungs every 6 (six) hours as needed for wheezing or shortness of breath. 18 g 3  . atorvastatin (LIPITOR) 20 MG tablet TAKE 1 TABLET (20 MG TOTAL) BY MOUTH DAILY. 90 tablet 2  . butalbital-acetaminophen-caffeine (FIORICET) 50-325-40 MG tablet Take 1 tablet by mouth every 6 (six) hours as needed for headache or migraine. 14 tablet 5  . CALCIUM PO Take 1 tablet by mouth every morning.     . cetirizine (ZYRTEC) 10 MG tablet TAKE 1 TABLET (10 MG TOTAL) BY MOUTH DAILY. 90 tablet 1  . cholecalciferol (VITAMIN D) 1000 UNITS tablet Take 1,000 Units by mouth every morning.     . cholestyramine (QUESTRAN) 4 g packet Take 4 g by mouth every other day.    . clopidogrel (PLAVIX) 75 MG tablet Take 1 tablet (75 mg total) by mouth daily. 90 tablet 1  . furosemide (LASIX) 20 MG tablet Take 1 tablet (20 mg total) by mouth daily. 90 tablet 3  . HYDROcodone-acetaminophen (NORCO) 5-325 MG tablet Take 1 tablet by mouth every 4 (four) hours as needed for moderate pain. 40 tablet 0  . hydrocortisone 2.5 % cream Apply topically 2 (two) times daily. 30 g 0  . lisinopril (PRINIVIL,ZESTRIL) 20 MG tablet Take 1 tablet (20 mg total) by mouth daily. 90 tablet 3  .  metformin (FORTAMET) 500 MG (OSM) 24 hr tablet Take 1 tablet (500 mg total) by mouth daily with breakfast. 90 tablet 1  . Multiple Vitamin (MULTIVITAMIN) capsule Take 1 capsule by mouth daily.      . nebivolol (BYSTOLIC) 10 MG tablet Take 3 tablets (30 mg total) by mouth daily. 90 tablet 9  . nitroGLYCERIN (NITROSTAT) 0.4 MG SL tablet Place 0.4 mg under the tongue every 5 (five) minutes as needed for chest pain (x 3 doses).    [name]  ondansetron (ZOFRAN ODT) 4 MG disintegrating tablet Take 1 tablet (4 mg total) by mouth every 8 (eight) hours as needed for nausea or vomiting. 12 tablet 1  . Polyethyl Glycol-Propyl Glycol (SYSTANE) 0.4-0.3 % GEL Place 1 drop into both eyes daily as needed (dry eyes).     . Potassium 99 MG TABS Take 99 mg by mouth daily.     . pregabalin (LYRICA) 100 MG capsule Take 1 capsule (100 mg total) by mouth as needed (FIBROMYALGIA). 30 capsule 2  . ranitidine (ZANTAC) 300 MG tablet Take 1 tablet (300 mg total) by mouth at bedtime. 90 tablet 3  . calcitonin, salmon, (MIACALCIN) 200 UNIT/ACT nasal spray Place 1 spray into alternate nostrils daily. (Patient not taking: Reported on 03/21/2016) 3.7 mL 12   Facility-Administered Medications Prior to Visit  Medication Dose Route Frequency Provider Last Rate Last Dose  . 0.9 %  sodium chloride infusion  500 mL Intravenous Continuous Milus Banister, MD         Allergies:   Bactrim [sulfamethoxazole-trimethoprim]; Cefuroxime axetil; Oxycodone; Pravastatin; Seldane [terfenadine]; Zocor [simvastatin]; and Tramadol   Social History   Social History  . Marital status: Married    Spouse name: Ilona Sorrel  . Number of children: 2  . Years of education: masters   Occupational History  . Retired, disable since 2000 Retired   Social History Main Topics  . Smoking status: Former Smoker    Packs/day: 0.25    Years: 5.00    Types: Cigarettes    Quit date: 01/29/1998  . Smokeless tobacco: Never Used     Comment: Quit in 2001  . Alcohol use  No  . Drug use: No  . Sexual activity: No   Other Topics Concern  . None   Social History Narrative   On disability since 2000--- also husband has MS   Education. College.   Right handed.     Family History:  The patient's family history includes Allergies in her sister; Aneurysm in her brother; Asthma in her paternal grandmother and sister; Diabetes in her brother and brother; Emphysema in her brother; Heart disease in her brother, father, and mother; Kidney failure in her brother; Lung cancer in her mother; Parkinsonism in her sister; Stroke in her brother, maternal grandmother, and paternal grandmother.     ROS:   Please see the history of present illness.    ROS All other systems reviewed and are negative.   PHYSICAL EXAM:   VS:  BP 138/84   Pulse 80   Ht 5\' 3"  (1.6 m)   Wt 197 lb 12.8 oz (89.7 kg)   SpO2 98%   BMI 35.04 kg/m    GEN: Well nourished, well developed, in no acute distress, obese HEENT: normal  Neck: no JVD, carotid bruits, or masses Cardiac: RRR; no murmurs, rubs, or gallops,no edema  Respiratory:  clear to auscultation bilaterally, normal work of breathing GI: soft, nontender, nondistended, + BS MS: no deformity or atrophy  Skin: warm and dry, no rash Neuro:  Alert and Oriented x 3, Strength and sensation are intact Psych: euthymic mood, full affect   Wt Readings from Last 3 Encounters:  03/21/16 197 lb 12.8 oz (89.7 kg)  02/29/16 201 lb (91.2 kg)  02/23/16 195 lb 3.2 oz (88.5 kg)      Studies/Labs Reviewed:   EKG:  EKG is ordered today.  The ekg ordered today demonstrates NSR with 1st degree AV block. Difficult to interpret given baseline wander  Recent Labs:  07/25/2015: Brain Natriuretic Peptide 31.8; Hemoglobin 12.9; Platelets 142; TSH 2.22 10/19/2015: ALT 18; BUN 20; Creatinine, Ser 0.93; Potassium 3.9; Sodium 141   Lipid Panel    Component Value Date/Time   CHOL 169 07/06/2015 0916   TRIG 109 07/06/2015 0916   HDL 74 07/06/2015 0916    CHOLHDL 2.3 07/06/2015 0916   VLDL 22 07/06/2015 0916   LDLCALC 73 07/06/2015 0916   LDLDIRECT 126.2 12/07/2010 1039    Additional studies/ records that were reviewed today include:  Summarized above   ASSESSMENT & PLAN:    CAD/chest pain: known moderate LAD disease by cardiac CT. Low risk nuclear study 07/2015. ECG with no acute ST or TW changes. With ongoing chest pain will set up for LHC. I will also start imdur 30mg  daily. Continue plavix (on for TIA), statin and BB. Will get pre cath labs.   I have reviewed the risks, indications, and alternatives to cardiac catheterization and possible angioplasty/stenting with the patient. Risks include but are not limited to bleeding, infection, vascular injury, stroke, myocardial infection, arrhythmia, kidney injury, radiation-related injury in the case of prolonged fluoroscopy use, emergency cardiac surgery, and death. The patient understands the risks of serious complication is low (123456).    HTN: BP well controlled today  Chronic diastolic CHF: appears euvolemic today. Continue lasix 20mg  daily   HLD: continue statin.   Diabetes: controlled. HgA1c 6.5. Follow up with PCP  Colon CA: currently in remission.   Hx of TIA: continue plavix and statin   Medication Adjustments/Labs and Tests Ordered: Current medicines are reviewed at length with the patient today.  Concerns regarding medicines are outlined above.  Medication changes, Labs and Tests ordered today are listed in the Patient Instructions below. Patient Instructions  Medication Instructions:  Your physician recommends that you continue on your current medications as directed. Please refer to the Current Medication list given to you today.   Labwork: TODAY:  BMET, CBC, & PT/INRE  Testing/Procedures: Your physician has requested that you have a cardiac catheterization. Cardiac catheterization is used to diagnose and/or treat various heart conditions. Doctors may recommend this  procedure for a number of different reasons. The most common reason is to evaluate chest pain. Chest pain can be a symptom of coronary artery disease (CAD), and cardiac catheterization can show whether plaque is narrowing or blocking your heart's arteries. This procedure is also used to evaluate the valves, as well as measure the blood flow and oxygen levels in different parts of your heart. For further information please visit [email or web address]. Please follow instruction sheet, as given.    Follow-Up: Your physician recommends that you schedule a follow-up appointment in: WILL BE SET UP AT DISCHARGE   Any Other Special Instructions Will Be Listed Below (If Applicable).   Coronary Angiogram With Stent Coronary angiogram with stent placement is a procedure to widen or open a narrow blood vessel of the heart (coronary artery). Arteries may become blocked by cholesterol buildup (plaques) in the lining or wall. When a coronary artery becomes partially blocked, blood flow to that area decreases. This may lead to chest pain or a heart attack (myocardial infarction). A stent is a small piece of metal that looks like mesh or a spring. Stent placement may be done as treatment for a heart attack or right after a coronary angiogram in which a blocked artery is found. Let your health care provider know about:  Any allergies you have.  All medicines you are taking, including vitamins, herbs, eye  drops, creams, and over-the-counter medicines.  Any problems you or family members have had with anesthetic medicines.  Any blood disorders you have.  Any surgeries you have had.  Any medical conditions you have.  Whether you are pregnant or may be pregnant. What are the risks? Generally, this is a safe procedure. However, problems may occur, including:  Damage to the heart or its blood vessels.  A return of blockage.  Bleeding, infection, or bruising at the insertion site.  A collection of blood  under the skin (hematoma) at the insertion site.  A blood clot in another part of the body.  Kidney injury.  Allergic reaction to the dye or contrast that is used.  Bleeding into the abdomen (retroperitoneal bleeding). What happens before the procedure? Staying hydrated  Follow instructions from your health care provider about hydration, which may include:  Up to 2 hours before the procedure - you may continue to drink clear liquids, such as water, clear fruit juice, black coffee, and plain tea. Eating and drinking restrictions  Follow instructions from your health care provider about eating and drinking, which may include:  8 hours before the procedure - stop eating heavy meals or foods such as meat, fried foods, or fatty foods.  6 hours before the procedure - stop eating light meals or foods, such as toast or cereal.  2 hours before the procedure - stop drinking clear liquids. Ask your health care provider about:  Changing or stopping your regular medicines. This is especially important if you are taking diabetes medicines or blood thinners.  Taking medicines such as ibuprofen. These medicines can thin your blood. Do not take these medicines before your procedure if your health care provider instructs you not to. Generally, aspirin is recommended before a procedure of passing a small, thin tube (catheter) through a blood vessel and into the heart (cardiac catheterization). What happens during the procedure?  An IV tube will be inserted into one of your veins.  You will be given one or more of the following:  A medicine to help you relax (sedative).  A medicine to numb the area where the catheter will be inserted into an artery (local anesthetic).  To reduce your risk of infection:  Your health care team will wash or sanitize their hands.  Your skin will be washed with soap.  Hair may be removed from the area where the catheter will be inserted.  Using a guide wire, the  catheter will be inserted into an artery. The location may be in your groin, in your wrist, or in the fold of your arm (near your elbow).  A type of X-ray (fluoroscopy) will be used to help guide the catheter to the opening of the arteries in the heart.  A dye will be injected into the catheter, and X-rays will be taken. The dye will help to show where any narrowing or blockages are located in the arteries.  A tiny wire will be guided to the blocked spot, and a balloon will be inflated to make the artery wider.  The stent will be expanded and will crush the plaques into the wall of the vessel. The stent will hold the area open and improve the blood flow. Most stents have a drug coating to reduce the risk of the stent narrowing over time.  The artery may be made wider using a drill, laser, or other tools to remove plaques.  When the blood flow is better, the catheter will be removed. The  lining of the artery will grow over the stent, which stays where it was placed. This procedure may vary among health care providers and hospitals. What happens after the procedure?  If the procedure is done through the leg, you will be kept in bed lying flat for about 6 hours. You will be instructed to not bend and not cross your legs.  The insertion site will be checked frequently.  The pulse in your foot or wrist will be checked frequently.  You may have additional blood tests, X-rays, and a test that records the electrical activity of your heart (electrocardiogram, or ECG). This information is not intended to replace advice given to you by your health care provider. Make sure you discuss any questions you have with your health care provider. Document Released: 07/22/2002 Document Revised: 09/15/2015 Document Reviewed: 08/21/2015 Elsevier Interactive Patient Education  2017 [employer].  If you need a refill on your cardiac medications before your next appointment, please call your pharmacy.       Signed, Angelena Form, PA-C  03/21/2016 10:27 AM    [facility] [address], [place], [place]  [address] Phone: [phone]; Fax: [phone]

## 2016-03-23 ENCOUNTER — Telehealth: Payer: Self-pay | Admitting: Cardiology

## 2016-03-23 MED ORDER — ISOSORBIDE MONONITRATE ER 30 MG PO TB24: 30.0000 mg | ORAL_TABLET | Freq: Every day | ORAL | 3 refills | Status: DC

## 2016-03-23 MED FILL — Lidocaine HCl Local Preservative Free (PF) Inj 1%: INTRAMUSCULAR | Qty: 30 | Status: AC

## 2016-03-23 NOTE — Telephone Encounter (Signed)
I called and spoke with the patient. She is aware of Dr. Francesca Oman recommendations to start imdur 30 mg once daily and f/u in 3 months. Appt scheduled for 06/12/16 at 8:20 am. Imdur RX sent to [facility] at [facility].

## 2016-03-23 NOTE — Telephone Encounter (Signed)
New Message    Pt is returning Samantha [name] call about test results, she was in hospital yesterday and missed call

## 2016-03-23 NOTE — Telephone Encounter (Signed)
Informed patient that her pre cath labs were normal.  She states the possibility of switching her to a long acting nitrate was discussed with her at her last OV. She wants to know if that will be considered, since she did not need an intervention.  Also she asked about when follow up should be.  I advised I would send a message to [name] and her nurse and that someone will call her back with that information.

## 2016-03-23 NOTE — Telephone Encounter (Signed)
Please send imdur 30 mg po daily to her pharmacy, please arrange 3 months follow up.

## 2016-03-27 ENCOUNTER — Telehealth: Payer: Self-pay | Admitting: Cardiology

## 2016-03-27 NOTE — Telephone Encounter (Signed)
Patient had a cath on Thursday and states that when she removed tape after 24 hours, there were two blisters on her right arm. The blisters have not gone away and are turning "dark.' Patient believes she had an allergic reaction and would like some advice as to what to do next. Please call to discuss, thanks.

## 2016-03-27 NOTE — Telephone Encounter (Signed)
Pt states she had cath last Thursday. She removed tape from cath site, she developed 2 blisters on the same side under where tape had been, not a site of cath.  Pt states cath site is fine without drainage or swelling, she feels she had an allergic reaction to tape. Pt states there is no drainage from blisters but is turning dark underneath the blisters. Pt advised to keep area of blisters clean and dry. Pt advised to call back if blisters do not improve and develop any drainage/pus, irritation or swelling  at site.  Pt advised I will forward to Dr Meda Coffee for review or further recommendations.

## 2016-03-28 NOTE — Telephone Encounter (Signed)
Informed the pt of Dr Francesca Oman recommendations for blister/skin irritation at cath site.  Pt states she feels as if it was an allergic reaction as well.  Pt states she see's improvement in that area today.  Pt verbalized understanding and agrees with this plan.

## 2016-03-28 NOTE — Telephone Encounter (Signed)
I agree that it is most probably an allergic reaction. If it is itchy she can use Benadryl 25 mg po at night. She can also apply 1% hydrocortisone cream or ointment. Ena Dawley, MD

## 2016-03-30 ENCOUNTER — Ambulatory Visit (INDEPENDENT_AMBULATORY_CARE_PROVIDER_SITE_OTHER): Payer: Medicare Other | Admitting: Orthopaedic Surgery

## 2016-03-30 ENCOUNTER — Encounter (INDEPENDENT_AMBULATORY_CARE_PROVIDER_SITE_OTHER): Payer: Self-pay | Admitting: Orthopaedic Surgery

## 2016-03-30 VITALS — BP 146/82 | HR 63

## 2016-03-30 DIAGNOSIS — I2510 Atherosclerotic heart disease of native coronary artery without angina pectoris: Secondary | ICD-10-CM | POA: Diagnosis not present

## 2016-03-30 DIAGNOSIS — M47816 Spondylosis without myelopathy or radiculopathy, lumbar region: Secondary | ICD-10-CM | POA: Diagnosis not present

## 2016-03-30 DIAGNOSIS — I2583 Coronary atherosclerosis due to lipid rich plaque: Secondary | ICD-10-CM

## 2016-03-30 DIAGNOSIS — M1711 Unilateral primary osteoarthritis, right knee: Secondary | ICD-10-CM

## 2016-03-30 NOTE — Progress Notes (Signed)
Office Visit Note   Patient: Samantha [name]           Date of Birth: 1940-11-12           MRN: [id number] Visit Date: 03/30/2016              Requested by: [name], MD [place], [place] [address] PCP: [name], MD   Assessment & Plan: Visit Diagnoses:  1. Spondylosis of lumbar region without myelopathy or radiculopathy   2. Unilateral primary osteoarthritis, right knee     Plan: Patient's back pain is significantly improved where she has degenerative facet changes but no compression fractures. Knee is better on home exercise program. Continue conservative treatment for her knee. She has osteoarthritis of the knee but has multiple list of complex medical problems including chronic diastolic heart failure, history of TIAs and coronary artery disease.  Follow-Up Instructions: Return in about 2 months (around 05/30/2016).   Orders:  No orders of the defined types were placed in this encounter.  No orders of the defined types were placed in this encounter.     Procedures: No procedures performed   Clinical Data: No additional findings.   Subjective: Chief Complaint  Patient presents with  . Right Knee - Pain, Follow-up    Patient is here for a right knee follow up, she is not walking with her cane today.  She does complain of continued right knee stiffness and pain with increased activity or any rotation.  She has been doing some home exercises which has helped strengthen her right knee.  Patient is no longer having any lower back pain.    Review of Systems 14 point review of systems is updated and is unchanged from 02/29/2016. Patient has multisystem problems including fibromyalgia, type 2 diabetes, coronary artery disease, congestive heart failure, diastolic. History of TIA, vertebral basilar insufficiency.   Objective: Vital Signs: BP (!) 146/82   Pulse 63   Physical Exam  Constitutional: She is oriented to person, place, and time. She  appears well-developed.  HENT:  Head: Normocephalic.  Right Ear: External ear normal.  Left Ear: External ear normal.  Eyes: Pupils are equal, round, and reactive to light.  Neck: No tracheal deviation present. No thyromegaly present.  Cardiovascular: Normal rate.   Pulmonary/Chest: Effort normal.  Abdominal: Soft.  Musculoskeletal:  Patient is ambulating today without her cane she does use her cane intermittently. Sitting standing with ambulating back pain is significantly improved. Normal hip range of motion knee reaches full extension with crepitus. Patellofemoral degenerative changes noted on previous x-rays. Good capillary refill to her feet. No plantar foot lesions.  Neurological: She is alert and oriented to person, place, and time.  Skin: Skin is warm and dry.  Psychiatric: She has a normal mood and affect. Her behavior is normal.    Ortho Exam patient palpable pulses valgus deformity of the right knee which is 25 clinically. Knee and ankle jerk are intact. No venous stasis changes. She does have some superficial varicosities. Trace knee effusion noted today.  Specialty Comments:  No specialty comments available.  Imaging: No results found.   PMFS History: Patient Active Problem List   Diagnosis Date Noted  . Angina pectoris ([place]) 03/22/2016  . Spondylosis of lumbar region without myelopathy or radiculopathy 02/02/2016  . CAD in native artery 08/22/2015  . Hypertensive heart disease 07/07/2015  . Essential hypertension 07/07/2015  . SOB (shortness of breath) 07/07/2015  . Dizziness 07/07/2015  .  Plantar fasciitis, left 03/22/2015  . Hx of colon cancer, stage I 01/11/2015  . Coronary artery disease due to lipid rich plaque 01/05/2015  . Chronic diastolic CHF (congestive heart failure), NYHA class 2 ([place]) 01/05/2015  . 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 Migraine (Ocular) 01/05/2014  . Gait difficulty  10/06/2013  . Pain in joint, shoulder region 11/27/2012  . VBI (vertebrobasilar insufficiency) 08/22/2012  . Right knee pain 07/29/2012  . TIA (transient ischemic attack) 06/27/2012  . Edema 02/01/2012  . DJD (degenerative joint disease) 02/02/2011  . 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 Postherpetic neuralgia ? 01/02/2010  . NECK PAIN 10/05/2009  . Palpitations 11/23/2008  . UTI'S, RECURRENT 09/28/2008  . Fibromyalgia 08/15/2007  . FATIGUE 11/19/2006  . 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 Hyperlipidemia 05/21/2006  . Reactive airway disease 01/29/2002   Past Medical History:  Diagnosis Date  . Abnormal CT of the chest 2008   last CT4-l 2009:  . No f/u suggested   . Allergy   . Asthma   . CAD (coronary artery disease)    a. Coronary CTA 10/16: Coronary Ca score 211, mod non-obstructive CAD with LM mild plaque (25-50%), mid LAD 50-69%. b. Neg nuc 06/2015.  . Cataract    BILATERAL-REMOVED  . Chronic diastolic CHF (congestive heart failure) ([place])   . Collagen vascular disease ([place])    "arterial sclerosis" per pt  . Complication of anesthesia    trouble waking up  . Fibromyalgia   . GERD (gastroesophageal reflux disease)   . H/O hiatal hernia   . Heart murmur   . Hyperlipidemia   . Hypertension   . Malignant neoplasm of ascending colon ([facility]) 2016   Minimally invasive right hemicolectomy to be done   . Neuromuscular disorder ([facility])    FIBROMYALGIA  . Ocular migraine   . OSA (obstructive sleep apnea) 09/2007   dx w/ a sleep study, not on  CPAP  . Osteoarthritis   . Osteoporosis   . Pneumonia    "double" in 2004  . PONV (postoperative nausea and vomiting)   . Reactive airway disease 01/29/2002   dx of pseudoasthma / vcd in 2005 and nl sprirometry History of dyspnea, 2011,  improved after several medications were changed around Question of COPD, disproved July 06, 2009 with nl pft's      . Rheumatoid factor positive   . Shingles 11/2009  . Sleep  apnea   . Stroke ([place])   . TIA (transient ischemic attack)    x2 - on Plavix for this  . Torn rotator cuff    right worse than left, both are torn  . Tumor, 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  . Type II diabetes mellitus ([place])   . 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 Vaginal dysplasia     Family History  Problem Relation Age of Onset  . Heart disease Father   . Heart disease Mother   . Lung cancer Mother   . Allergies Sister   . Parkinsonism Sister     possible  . Asthma Sister   . Asthma Paternal Grandmother   . Stroke Paternal Grandmother   . Heart disease      paternal grandparents, maternal grandparents,   . Heart disease Brother   . Emphysema Brother   . Aneurysm Brother     x3  . Kidney failure Brother   .  Diabetes Brother   . Diabetes Brother   . Stroke Brother   . Stroke Maternal Grandmother   . Breast cancer Neg Hx   . Colon cancer Neg Hx   . Heart attack Neg Hx     Past Surgical History:  Procedure Laterality Date  . ABDOMINAL HYSTERECTOMY  1980   NO oophorectomy per pt   . ANTERIOR CERVICAL DECOMP/DISCECTOMY FUSION  2001   C 3, C4 and C5 plate and screws  . BREAST BIOPSY Right 1999  . BUNIONECTOMY Left ~ 1977  . CATARACT EXTRACTION W/ INTRAOCULAR LENS  IMPLANT, BILATERAL  2012  . COLON SURGERY  10/2014  . EYE SURGERY Bilateral    torq lens for cataracts  . LEFT HEART CATH AND CORONARY ANGIOGRAPHY N/A 03/22/2016   Procedure: Left Heart Cath and Coronary Angiography;  Surgeon: Belva Crome, MD;  Location: [facility];  Service: Cardiovascular;  Laterality: N/A;  . THYROIDECTOMY, PARTIAL  1960's  . VAGINAL MASS EXCISION  1994   "Laser surgery for vaginal cancer; followed by chemotherapy" (06/27/2012)   Social History   Occupational History  . Retired, disable since 2000 Retired   Social History Main Topics  . Smoking status: Former Smoker    Packs/day: 0.25    Years: 5.00    Types: Cigarettes    Quit date: 01/29/1998  . Smokeless  tobacco: Never Used     Comment: Quit in 2001  . Alcohol use No  . Drug use: No  . Sexual activity: No

## 2016-04-05 ENCOUNTER — Other Ambulatory Visit: Payer: Self-pay | Admitting: Neurology

## 2016-04-12 ENCOUNTER — Encounter: Payer: Self-pay | Admitting: Internal Medicine

## 2016-04-13 ENCOUNTER — Other Ambulatory Visit: Payer: Self-pay | Admitting: Internal Medicine

## 2016-04-16 ENCOUNTER — Telehealth: Payer: Self-pay | Admitting: Internal Medicine

## 2016-04-16 NOTE — Telephone Encounter (Signed)
Please advise if pt can come in tomorrow before appt.

## 2016-04-16 NOTE — Telephone Encounter (Signed)
Pt called stating she has been experiencing stomach pain and thinking she she might have UTI. Pt was hopping to come in and give urine sample? Please advise, pt has an appt with Dr. Quay Burow on 3/21 already and doesn't wants to see anyone else.

## 2016-04-16 NOTE — Telephone Encounter (Signed)
Ok to give sample 

## 2016-04-17 NOTE — Telephone Encounter (Signed)
Spoke with pt, she states she thinks she may have had food poisoning. She is no longer having the stomach pain. If symptoms resurface she will give specimen tomorrow at appt.

## 2016-04-18 ENCOUNTER — Encounter: Payer: Self-pay | Admitting: Internal Medicine

## 2016-04-18 ENCOUNTER — Other Ambulatory Visit (INDEPENDENT_AMBULATORY_CARE_PROVIDER_SITE_OTHER): Payer: Medicare Other

## 2016-04-18 ENCOUNTER — Ambulatory Visit (INDEPENDENT_AMBULATORY_CARE_PROVIDER_SITE_OTHER): Payer: Medicare Other | Admitting: Internal Medicine

## 2016-04-18 VITALS — BP 130/80 | HR 76 | Temp 98.1°F | Ht 63.0 in | Wt 194.6 lb

## 2016-04-18 DIAGNOSIS — I10 Essential (primary) hypertension: Secondary | ICD-10-CM

## 2016-04-18 DIAGNOSIS — L03012 Cellulitis of left finger: Secondary | ICD-10-CM | POA: Insufficient documentation

## 2016-04-18 DIAGNOSIS — R39198 Other difficulties with micturition: Secondary | ICD-10-CM

## 2016-04-18 DIAGNOSIS — I2583 Coronary atherosclerosis due to lipid rich plaque: Secondary | ICD-10-CM | POA: Diagnosis not present

## 2016-04-18 DIAGNOSIS — E11311 Type 2 diabetes mellitus with unspecified diabetic retinopathy with macular edema: Secondary | ICD-10-CM

## 2016-04-18 DIAGNOSIS — B0229 Other postherpetic nervous system involvement: Secondary | ICD-10-CM | POA: Diagnosis not present

## 2016-04-18 DIAGNOSIS — Z794 Long term (current) use of insulin: Secondary | ICD-10-CM

## 2016-04-18 DIAGNOSIS — J4530 Mild persistent asthma, uncomplicated: Secondary | ICD-10-CM | POA: Diagnosis not present

## 2016-04-18 DIAGNOSIS — R1030 Lower abdominal pain, unspecified: Secondary | ICD-10-CM

## 2016-04-18 DIAGNOSIS — I2510 Atherosclerotic heart disease of native coronary artery without angina pectoris: Secondary | ICD-10-CM

## 2016-04-18 LAB — URINALYSIS, ROUTINE W REFLEX MICROSCOPIC
BILIRUBIN URINE: NEGATIVE
Ketones, ur: NEGATIVE
LEUKOCYTES UA: NEGATIVE
NITRITE: NEGATIVE
Specific Gravity, Urine: <=1.005 — AB (ref 1.000–1.030)
TOTAL PROTEIN, URINE-UPE24: NEGATIVE
URINE GLUCOSE: NEGATIVE
UROBILINOGEN UA: 0.2 (ref 0.0–1.0)
pH: 6 (ref 5.0–8.0)

## 2016-04-18 LAB — COMPREHENSIVE METABOLIC PANEL
ALK PHOS: 54 U/L (ref 39–117)
ALT: 12 U/L (ref 0–35)
AST: 13 U/L (ref 0–37)
Albumin: 4.1 g/dL (ref 3.5–5.2)
BUN: 15 mg/dL (ref 6–23)
CO2: 27 meq/L (ref 19–32)
Calcium: 9.4 mg/dL (ref 8.4–10.5)
Chloride: 104 mEq/L (ref 96–112)
Creatinine, Ser: 0.88 mg/dL (ref 0.40–1.20)
GFR: 80.79 mL/min (ref >60.00)
GLUCOSE: 104 mg/dL — AB (ref 70–99)
POTASSIUM: 3.9 meq/L (ref 3.5–5.1)
Sodium: 138 mEq/L (ref 135–145)
TOTAL PROTEIN: 7.1 g/dL (ref 6.0–8.3)
Total Bilirubin: 0.6 mg/dL (ref 0.2–1.2)

## 2016-04-18 LAB — CBC WITH DIFFERENTIAL/PLATELET
BASOS ABS: 0 10*3/uL (ref 0.0–0.1)
Basophils Relative: 0.5 % (ref 0.0–3.0)
EOS ABS: 0.1 10*3/uL (ref 0.0–0.7)
Eosinophils Relative: 2.1 % (ref 0.0–5.0)
HCT: 35.9 % — ABNORMAL LOW (ref 36.0–46.0)
Hemoglobin: 12.2 g/dL (ref 12.0–15.0)
LYMPHS PCT: 32.2 % (ref 12.0–46.0)
Lymphs Abs: 1.8 10*3/uL (ref 0.7–4.0)
MCHC: 34 g/dL (ref 30.0–36.0)
MCV: 83.8 fl (ref 78.0–100.0)
MONOS PCT: 8.9 % (ref 3.0–12.0)
Monocytes Absolute: 0.5 10*3/uL (ref 0.1–1.0)
NEUTROS ABS: 3.2 10*3/uL (ref 1.4–7.7)
NEUTROS PCT: 56.3 % (ref 43.0–77.0)
Platelets: 149 10*3/uL — ABNORMAL LOW (ref 150.0–400.0)
RBC: 4.29 Mil/uL (ref 3.87–5.11)
RDW: 14.7 % (ref 11.5–15.5)
WBC: 5.7 10*3/uL (ref 4.0–10.5)

## 2016-04-18 LAB — HEMOGLOBIN A1C: Hgb A1c MFr Bld: 6.8 % — ABNORMAL HIGH (ref 4.6–6.5)

## 2016-04-18 MED ORDER — BUDESONIDE-FORMOTEROL FUMARATE 80-4.5 MCG/ACT IN AERO: 2.0000 | INHALATION_SPRAY | Freq: Two times a day (BID) | RESPIRATORY_TRACT | 3 refills | Status: DC

## 2016-04-18 NOTE — Assessment & Plan Note (Signed)
For a few days - needs to lean forward to help her get her urine out -- this is new Possible UTI given acute nature Check UA, UCx

## 2016-04-18 NOTE — Progress Notes (Signed)
Subjective:    Patient ID: Samantha [name], female    DOB: 03-31-1940, 76 y.o.   MRN: [id number]  HPI The patient is here for follow up.  Diabetes w/ neuropathy: She is taking her medication daily as prescribed. She is compliant with a diabetic diet. She is not exercising regularly. She monitors her sugars and they have been running 85-110. She checks her feet daily and denies foot lesions. She is up-to-date with an ophthalmology examination.  Her neuropathy pain is fairly controlled.   CAD, chronic diastolic dysfunction, Hypertension: She is taking her medication daily. She is compliant with a low sodium diet.  She gets chest pain and recently had a cardiac catherization.  She was started on Imdur and that has helped.  She did have some chest pain today on her way here.  She has mild ankle edema.  She is not exercising regularly.  She does not monitor her blood pressure at home.    She has had lower abdominal pressure and difficulty urinating/needs to lean forward - these symptoms started a few days ago.  She denies increased frequency, dysuria and hematuria.    Right knee pain:  She was told she needs to have a knee replacement.  She is doing leg exercises, but it has limited her walking.  She wants to avoid surgery right now.   Reactive airway disease, ? Asthma or copd: she has wheezing frequently.  She denies coughing.  She uses the albuterol three times a week on average.  She is not on a maintenance inhaler.   Left fourth finger: she has had swelling and tenderness around her lateral aspect of her finger nail.  It started three weeks ago.  She did have pus coming out from under the nail at one point, but not recently.   Medications and allergies reviewed with patient and updated if appropriate.  Patient Active Problem List   Diagnosis Date Noted  . Angina pectoris ([place]) 03/22/2016  . Spondylosis of lumbar region without myelopathy or radiculopathy 02/02/2016  . CAD in native  artery 08/22/2015  . Hypertensive heart disease 07/07/2015  . Essential hypertension 07/07/2015  . SOB (shortness of breath) 07/07/2015  . Dizziness 07/07/2015  . Plantar fasciitis, left 03/22/2015  . Hx of colon cancer, stage I 01/11/2015  . Coronary artery disease due to lipid rich plaque 01/05/2015  . Chronic diastolic CHF (congestive heart failure), NYHA class 2 ([place]) 01/05/2015  . 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 Migraine (Ocular) 01/05/2014  . Gait difficulty 10/06/2013  . Pain in joint, shoulder region 11/27/2012  . VBI (vertebrobasilar insufficiency) 08/22/2012  . Right knee pain 07/29/2012  . TIA (transient ischemic attack) 06/27/2012  . Edema 02/01/2012  . DJD (degenerative joint disease) 02/02/2011  . 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 Postherpetic neuralgia ? 01/02/2010  . NECK PAIN 10/05/2009  . Palpitations 11/23/2008  . UTI'S, RECURRENT 09/28/2008  . Fibromyalgia 08/15/2007  . FATIGUE 11/19/2006  . 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 Hyperlipidemia 05/21/2006  . Reactive airway disease 01/29/2002    Current Outpatient Prescriptions on File Prior to Visit  Medication Sig Dispense Refill  . acetaminophen (TYLENOL) 500 MG tablet Take 1,000 mg by mouth every 6 (six) hours as needed for moderate pain.    [name] albuterol (PROVENTIL HFA;VENTOLIN HFA) 108 (90 Base) MCG/ACT inhaler Inhale 2 puffs into the lungs every 6 (six) hours as  needed for wheezing or shortness of breath. 18 g 3  . atorvastatin (LIPITOR) 20 MG tablet TAKE 1 TABLET (20 MG TOTAL) BY MOUTH DAILY. 90 tablet 2  . butalbital-acetaminophen-caffeine (FIORICET) 50-325-40 MG tablet Take 1 tablet by mouth every 6 (six) hours as needed for headache or migraine. 14 tablet 5  . CALCIUM PO Take 1 tablet by mouth every morning.     . cetirizine (ZYRTEC) 10 MG tablet TAKE 1 TABLET (10 MG TOTAL) BY MOUTH DAILY. 90 tablet 1  .  cholecalciferol (VITAMIN D) 1000 UNITS tablet Take 1,000 Units by mouth every morning.     . cholestyramine (QUESTRAN) 4 g packet Take 4 g by mouth every other day.    . clopidogrel (PLAVIX) 75 MG tablet Take 1 tablet (75 mg total) by mouth daily. 90 tablet 1  . furosemide (LASIX) 20 MG tablet Take 1 tablet (20 mg total) by mouth daily. 90 tablet 3  . HYDROcodone-acetaminophen (NORCO) 5-325 MG tablet Take 1 tablet by mouth every 4 (four) hours as needed for moderate pain. 40 tablet 0  . hydrocortisone 2.5 % cream Apply topically 2 (two) times daily. (Patient taking differently: Apply 1 application topically 2 (two) times daily as needed. ) 30 g 0  . isosorbide mononitrate (IMDUR) 30 MG 24 hr tablet Take 1 tablet (30 mg total) by mouth daily. 90 tablet 3  . lisinopril (PRINIVIL,ZESTRIL) 20 MG tablet Take 1 tablet (20 mg total) by mouth daily. 90 tablet 3  . metformin (FORTAMET) 500 MG (OSM) 24 hr tablet TAKE 1 TABLET(500 MG) BY MOUTH DAILY WITH BREAKFAST 90 tablet 1  . Multiple Vitamin (MULTIVITAMIN) capsule Take 1 capsule by mouth daily.      . nebivolol (BYSTOLIC) 10 MG tablet Take 3 tablets (30 mg total) by mouth daily. 90 tablet 9  . nitroGLYCERIN (NITROSTAT) 0.4 MG SL tablet Place 0.4 mg under the tongue every 5 (five) minutes as needed for chest pain (x 3 doses).    . ondansetron (ZOFRAN ODT) 4 MG disintegrating tablet Take 1 tablet (4 mg total) by mouth every 8 (eight) hours as needed for nausea or vomiting. 12 tablet 1  . Polyethyl Glycol-Propyl Glycol (SYSTANE) 0.4-0.3 % GEL Place 1 drop into both eyes daily as needed (dry eyes).     . Potassium 99 MG TABS Take 99 mg by mouth daily.     . pregabalin (LYRICA) 100 MG capsule Take 1 capsule (100 mg total) by mouth as needed (FIBROMYALGIA). 30 capsule 2  . ranitidine (ZANTAC) 300 MG tablet Take 1 tablet (300 mg total) by mouth at bedtime. 90 tablet 3   No current facility-administered medications on file prior to visit.     Past Medical  History:  Diagnosis Date  . Abnormal CT of the chest 2008   last CT4-l 2009:  . No f/u suggested   . Allergy   . Asthma   . CAD (coronary artery disease)    a. Coronary CTA 10/16: Coronary Ca score 211, mod non-obstructive CAD with LM mild plaque (25-50%), mid LAD 50-69%. b. Neg nuc 06/2015.  . Cataract    BILATERAL-REMOVED  . Chronic diastolic CHF (congestive heart failure) ([place])   . Collagen vascular disease ([place])    "arterial sclerosis" per pt  . Complication of anesthesia    trouble waking up  . Fibromyalgia   . GERD (gastroesophageal reflux disease)   . H/O hiatal hernia   . Heart murmur   . Hyperlipidemia   . Hypertension   .  Malignant neoplasm of ascending colon ([place]) 2016   Minimally invasive right hemicolectomy to be done   . Neuromuscular disorder ([facility])    FIBROMYALGIA  . Ocular migraine   . OSA (obstructive sleep apnea) 09/2007   dx w/ a sleep study, not on  CPAP  . Osteoarthritis   . Osteoporosis   . Pneumonia    "double" in 2004  . PONV (postoperative nausea and vomiting)   . Reactive airway disease 01/29/2002   dx of pseudoasthma / vcd in 2005 and nl sprirometry History of dyspnea, 2011,  improved after several medications were changed around Question of COPD, disproved July 06, 2009 with nl pft's      . Rheumatoid factor positive   . Shingles 11/2009  . Sleep apnea   . Stroke ([place])   . TIA (transient ischemic attack)    x2 - on Plavix for this  . Torn rotator cuff    right worse than left, both are torn  . Tumor, 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  . Type II diabetes mellitus ([place])   . Vaginal cancer ([place]) 1994  . Vaginal dysplasia     Past Surgical History:  Procedure Laterality Date  . ABDOMINAL HYSTERECTOMY  1980   NO oophorectomy per pt   . ANTERIOR CERVICAL DECOMP/DISCECTOMY FUSION  2001   C 3, C4 and C5 plate and screws  . BREAST BIOPSY Right 1999  . BUNIONECTOMY Left ~ 1977  . CATARACT EXTRACTION W/ INTRAOCULAR LENS  IMPLANT,  BILATERAL  2012  . COLON SURGERY  10/2014  . EYE SURGERY Bilateral    torq lens for cataracts  . LEFT HEART CATH AND CORONARY ANGIOGRAPHY N/A 03/22/2016   Procedure: Left Heart Cath and Coronary Angiography;  Surgeon: Belva Crome, MD;  Location: [facility];  Service: Cardiovascular;  Laterality: N/A;  . THYROIDECTOMY, PARTIAL  1960's  . VAGINAL MASS EXCISION  1994   "Laser surgery for vaginal cancer; followed by chemotherapy" (06/27/2012)    Social History   Social History  . Marital status: Married    Spouse name: Ilona Sorrel  . Number of children: 2  . Years of education: masters   Occupational History  . Retired, disable since 2000 Retired   Social History Main Topics  . Smoking status: Former Smoker    Packs/day: 0.25    Years: 5.00    Types: Cigarettes    Quit date: 01/29/1998  . Smokeless tobacco: Never Used     Comment: Quit in 2001  . Alcohol use No  . Drug use: No  . Sexual activity: No   Other Topics Concern  . None   Social History Narrative   On disability since 2000--- also husband has MS   Education. College.   Right handed.    Family History  Problem Relation Age of Onset  . Heart disease Father   . Heart disease Mother   . Lung cancer Mother   . Allergies Sister   . Parkinsonism Sister     possible  . Asthma Sister   . Asthma Paternal Grandmother   . Stroke Paternal Grandmother   . Heart disease      paternal grandparents, maternal grandparents,   . Heart disease Brother   . Emphysema Brother   . Aneurysm Brother     x3  . Kidney failure Brother   . Diabetes Brother   . Diabetes Brother   . Stroke Brother   . Stroke Maternal  Grandmother   . Breast cancer Neg Hx   . Colon cancer Neg Hx   . Heart attack Neg Hx     Review of Systems  Constitutional: Negative for chills and fever.  Respiratory: Positive for wheezing. Negative for cough and shortness of breath.   Cardiovascular: Positive for chest pain, palpitations (rare) and leg  swelling (mild).  Gastrointestinal: Positive for abdominal pain (lower abdominal pressure x 4 days). Negative for nausea.  Endocrine: Negative for cold intolerance.  Genitourinary: Positive for difficulty urinating (leaning forard helps). Negative for dysuria, frequency and hematuria (no gross hematuria, h/o microscopic hematuria - w/u in past negative).  Musculoskeletal: Positive for arthralgias.  Neurological: Positive for light-headedness and headaches (migraines - occular).       Objective:   Vitals:   04/18/16 1356  BP: 130/80  Pulse: 76  Temp: 98.1 F (36.7 C)   Wt Readings from Last 3 Encounters:  04/18/16 194 lb 9 oz (88.3 kg)  03/22/16 197 lb (89.4 kg)  03/21/16 197 lb 12.8 oz (89.7 kg)   Body mass index is 34.47 kg/m.   Physical Exam    Constitutional: Appears well-developed and well-nourished. No distress.  HENT:  Head: Normocephalic and atraumatic.  Neck: Neck supple. No tracheal deviation present. No thyromegaly present.  No cervical lymphadenopathy Cardiovascular: Normal rate, regular rhythm and normal heart sounds.   1/6 systolic murmur heard. No carotid bruit .  No edema Pulmonary/Chest: Effort normal and breath sounds normal. No respiratory distress. No has no wheezes. No rales.  Abdomen: soft, mild tenderness suprapubic region - no rebound or guarding Skin: Skin is warm and dry. Not diaphoretic. mild swelling/redness/tenderness lateral aspect of left 4th finger nail Psychiatric: Normal mood and affect. Behavior is normal.      Assessment & Plan:    See Problem List for Assessment and Plan of chronic medical problems.

## 2016-04-18 NOTE — Assessment & Plan Note (Signed)
Mild, but persistant over past three week Will await urine culture so we can see if she needs an antibiotic for that - will treat with antibiotic, can soak

## 2016-04-18 NOTE — Assessment & Plan Note (Addendum)
Check a1c Sugars well controlled at home Low sugar / carb diet Stressed regular exercise, weight loss Continue metformin

## 2016-04-18 NOTE — Patient Instructions (Addendum)
Urine and blood work ordered  Test(s) ordered today. Your results will be released to [facility] (or called to you) after review, usually within 72hours after test completion. If any changes need to be made, you will be notified at that same time.   Medications reviewed and updated.  Changes include starting symbicort for your reactive airway disease.  Rinse your mouth after use.   Your prescription(s) have been submitted to your pharmacy. Please take as directed and contact our office if you believe you are having problem(s) with the medication(s).   Please followup in 6 months

## 2016-04-18 NOTE — Progress Notes (Signed)
Pre visit review using our clinic review tool, if applicable. No additional management support is needed unless otherwise documented below in the visit note. 

## 2016-04-18 NOTE — Assessment & Plan Note (Signed)
Controlled, stable Continue current dose of medication  

## 2016-04-18 NOTE — Assessment & Plan Note (Signed)
For a few days - associated with difficulty urinating Possible UTI   Check UA, UCx

## 2016-04-18 NOTE — Assessment & Plan Note (Signed)
?   Asthma or COPD Having wheezing and using albuterol 3/week Start symbicort twice daily Decrease albuterol use

## 2016-04-18 NOTE — Assessment & Plan Note (Signed)
BP well controlled Current regimen effective and well tolerated Continue current medications at current doses cmp  

## 2016-04-20 ENCOUNTER — Encounter: Payer: Self-pay | Admitting: Internal Medicine

## 2016-04-20 ENCOUNTER — Other Ambulatory Visit: Payer: Self-pay | Admitting: Internal Medicine

## 2016-04-20 LAB — URINE CULTURE

## 2016-04-20 MED ORDER — NITROFURANTOIN MONOHYD MACRO 100 MG PO CAPS: 100.0000 mg | ORAL_CAPSULE | Freq: Two times a day (BID) | ORAL | 0 refills | Status: DC

## 2016-04-24 ENCOUNTER — Telehealth: Payer: Self-pay | Admitting: Emergency Medicine

## 2016-04-24 MED ORDER — METFORMIN HCL ER 500 MG PO TB24: 500.0000 mg | ORAL_TABLET | Freq: Every day | ORAL | 1 refills | Status: DC

## 2016-04-24 NOTE — Telephone Encounter (Signed)
[facility] in regards to not covering pt's Metformin. States that Metformin OSM was sent to pharmacy and that will not be covered. Will send Metformin ER to pharmacy.

## 2016-05-22 ENCOUNTER — Encounter: Payer: Self-pay | Admitting: Cardiology

## 2016-05-24 ENCOUNTER — Telehealth: Payer: Self-pay | Admitting: Internal Medicine

## 2016-05-24 MED ORDER — METFORMIN HCL ER 500 MG PO TB24: 500.0000 mg | ORAL_TABLET | Freq: Every day | ORAL | 1 refills | Status: DC

## 2016-05-24 NOTE — Telephone Encounter (Signed)
Pt needs PA on metFORMIN (GLUCOPHAGE-XR) 500 MG 24 hr    She was originally given 30 day supply not 90.  [facility] on [address].

## 2016-05-24 NOTE — Telephone Encounter (Signed)
Can we try a regular release for this pt. The first time we received this notice we changed the med and now this one is not covered, most likely because its an extended release. Please advise.

## 2016-05-24 NOTE — Telephone Encounter (Signed)
Rec'd call pt states she has spoken w/her insurance and they stated that they will cover the Glucophage XR 500 mg, but they will need new prescription. The new rx fir glucophage is already on pt med list inform [facility]...Johny Chess

## 2016-05-30 ENCOUNTER — Encounter (INDEPENDENT_AMBULATORY_CARE_PROVIDER_SITE_OTHER): Payer: Self-pay | Admitting: Orthopaedic Surgery

## 2016-05-30 ENCOUNTER — Ambulatory Visit (INDEPENDENT_AMBULATORY_CARE_PROVIDER_SITE_OTHER): Payer: Medicare Other | Admitting: Orthopaedic Surgery

## 2016-05-30 VITALS — BP 145/83 | HR 60

## 2016-05-30 DIAGNOSIS — I2583 Coronary atherosclerosis due to lipid rich plaque: Secondary | ICD-10-CM | POA: Diagnosis not present

## 2016-05-30 DIAGNOSIS — I2510 Atherosclerotic heart disease of native coronary artery without angina pectoris: Secondary | ICD-10-CM | POA: Diagnosis not present

## 2016-05-30 DIAGNOSIS — M47816 Spondylosis without myelopathy or radiculopathy, lumbar region: Secondary | ICD-10-CM | POA: Diagnosis not present

## 2016-05-30 DIAGNOSIS — M1711 Unilateral primary osteoarthritis, right knee: Secondary | ICD-10-CM | POA: Diagnosis not present

## 2016-05-30 NOTE — Progress Notes (Signed)
Office Visit Note   Patient: Samantha [name]           Date of Birth: 1941/01/27           MRN: [id number] Visit Date: 05/30/2016              Requested by: [name], MD [place], [address] [address] PCP: [name], MD   Assessment & Plan: Visit Diagnoses:  1. Spondylosis of lumbar region without myelopathy or radiculopathy   2. Unilateral primary osteoarthritis, right knee     Plan: She's responded treatment is doing better. She can try if she has increased problems. She understands that she has significant osteoarthritis for right knee and may require joint arthroplasty. She has increased problems difficulty ambulating she can return.  Follow-Up Instructions: Return if symptoms worsen or fail to improve.   Orders:  No orders of the defined types were placed in this encounter.  No orders of the defined types were placed in this encounter.     Procedures: No procedures performed   Clinical Data: No additional findings.   Subjective: Chief Complaint  Patient presents with  . Lower Back - Pain, Follow-up  . Right Knee - Pain    HPI 76 year old female returns with the back pain and right knee pain. She states her back is doing better. She's been moving better she's been doing some home exercises and feels that they have helped a lot. She feels that drinking more water is been helpful for her. She does have some cracking and popping in her knee with flexion and extension when she tries to do stairs. She is more ambulatory and pain is significantly angry decrease greater than 75%. Negative fever chills no bowel or bladder symptoms. Patient does have valgus deformity of her knee which is significant at 30.  Review of Systems review of systems updated. She is off her cane walking better. Continue to have problems of fibromyalgia type 2 diabetes, coronary disease, congestive heart failure, diastolic. History of TIA with vertebrobasilar insufficiency. Back  pain knee pain with knee deformity worse on the right than left knee   Objective: Vital Signs: BP (!) 145/83   Pulse 60   Physical Exam  Constitutional: She is oriented to person, place, and time. She appears well-developed.  HENT:  Head: Normocephalic.  Right Ear: External ear normal.  Left Ear: External ear normal.  Eyes: Pupils are equal, round, and reactive to light.  Neck: No tracheal deviation present. No thyromegaly present.  Cardiovascular: Normal rate.   Pulmonary/Chest: Effort normal.  Abdominal: Soft.  Musculoskeletal:  Patient's inflammatory she has some mild sciatic notch tenderness. Discomfort getting from sitting to standing when she is up she is able to ambulate with minimal limp. Worst crepitus on right knee than left with the 30 malalignment AP plane.  Neurological: She is alert and oriented to person, place, and time.  Skin: Skin is warm and dry.  Psychiatric: She has a normal mood and affect. Her behavior is normal.    Ortho Exam Distal pulses are palpable no lymphadenopathy. Gait is improved. Specialty Comments:  No specialty comments available.  Imaging: No results found.   PMFS History: Patient Active Problem List   Diagnosis Date Noted  . Difficulty urinating 04/18/2016  . Lower abdominal pain 04/18/2016  . Paronychia of left ring finger 04/18/2016  . Angina pectoris ([place]) 03/22/2016  . Spondylosis of lumbar region without myelopathy or radiculopathy 02/02/2016  . CAD in  native artery 08/22/2015  . Hypertensive heart disease 07/07/2015  . Essential hypertension 07/07/2015  . SOB (shortness of breath) 07/07/2015  . Dizziness 07/07/2015  . Plantar fasciitis, left 03/22/2015  . Hx of colon cancer, stage I 01/11/2015  . Coronary artery disease due to lipid rich plaque 01/05/2015  . Chronic diastolic CHF (congestive heart failure), NYHA class 2 ([place]) 01/05/2015  . 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 Migraine (Ocular) 01/05/2014  . Gait difficulty 10/06/2013  . Pain in joint, shoulder region 11/27/2012  . VBI (vertebrobasilar insufficiency) 08/22/2012  . Right knee pain 07/29/2012  . TIA (transient ischemic attack) 06/27/2012  . Edema 02/01/2012  . DJD (degenerative joint disease) 02/02/2011  . 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 Postherpetic neuralgia ? 01/02/2010  . NECK PAIN 10/05/2009  . Palpitations 11/23/2008  . UTI'S, RECURRENT 09/28/2008  . Fibromyalgia 08/15/2007  . FATIGUE 11/19/2006  . 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 Hyperlipidemia 05/21/2006  . Reactive airway disease 01/29/2002   Past Medical History:  Diagnosis Date  . Abnormal CT of the chest 2008   last CT4-l 2009:  . No f/u suggested   . Allergy   . Asthma   . CAD (coronary artery disease)    a. Coronary CTA 10/16: Coronary Ca score 211, mod non-obstructive CAD with LM mild plaque (25-50%), mid LAD 50-69%. b. Neg nuc 06/2015.  . Cataract    BILATERAL-REMOVED  . Chronic diastolic CHF (congestive heart failure) ([place])   . Collagen vascular disease ([place])    "arterial sclerosis" per pt  . Complication of anesthesia    trouble waking up  . Fibromyalgia   . GERD (gastroesophageal reflux disease)   . H/O hiatal hernia   . Heart murmur   . Hyperlipidemia   . Hypertension   . Malignant neoplasm of ascending colon ([place]) 2016   Minimally invasive right hemicolectomy to be done   . Neuromuscular disorder (HCC)    FIBROMYALGIA  . Ocular migraine   . OSA (obstructive sleep apnea) 09/2007   dx w/ a sleep study, not on  CPAP  . Osteoarthritis   . Osteoporosis   . Pneumonia    "double" in 2004  . PONV (postoperative nausea and vomiting)   . Reactive airway disease 01/29/2002   dx of pseudoasthma / vcd in 2005 and nl sprirometry History of dyspnea, 2011,  improved after several medications were changed around Question of COPD, disproved July 06, 2009 with nl  pft's      . Rheumatoid factor positive   . Shingles 11/2009  . Sleep apnea   . Stroke ([place])   . TIA (transient ischemic attack)    x2 - on Plavix for this  . Torn rotator cuff    right worse than left, both are torn  . Tumor, 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  . Type II diabetes mellitus ([place])   . 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 Vaginal dysplasia     Family History  Problem Relation Age of Onset  . Heart disease Father   . Heart disease Mother   . Lung cancer Mother   . Allergies Sister   . Parkinsonism Sister     possible  . Asthma Sister   . Asthma Paternal Grandmother   . Stroke Paternal Grandmother   . Heart disease      paternal grandparents, maternal grandparents,   .  Heart disease Brother   . Emphysema Brother   . Aneurysm Brother     x3  . Kidney failure Brother   . Diabetes Brother   . Diabetes Brother   . Stroke Brother   . Stroke Maternal Grandmother   . Breast cancer Neg Hx   . Colon cancer Neg Hx   . Heart attack Neg Hx     Past Surgical History:  Procedure Laterality Date  . ABDOMINAL HYSTERECTOMY  1980   NO oophorectomy per pt   . ANTERIOR CERVICAL DECOMP/DISCECTOMY FUSION  2001   C 3, C4 and C5 plate and screws  . BREAST BIOPSY Right 1999  . BUNIONECTOMY Left ~ 1977  . CATARACT EXTRACTION W/ INTRAOCULAR LENS  IMPLANT, BILATERAL  2012  . COLON SURGERY  10/2014  . EYE SURGERY Bilateral    torq lens for cataracts  . LEFT HEART CATH AND CORONARY ANGIOGRAPHY N/A 03/22/2016   Procedure: Left Heart Cath and Coronary Angiography;  Surgeon: Belva Crome, MD;  Location: [facility];  Service: Cardiovascular;  Laterality: N/A;  . THYROIDECTOMY, PARTIAL  1960's  . VAGINAL MASS EXCISION  1994   "Laser surgery for vaginal cancer; followed by chemotherapy" (06/27/2012)   Social History   Occupational History  . Retired, disable since 2000 Retired   Social History Main Topics  . Smoking status: Former Smoker    Packs/day: 0.25    Years:  5.00    Types: Cigarettes    Quit date: 01/29/1998  . Smokeless tobacco: Never Used     Comment: Quit in 2001  . Alcohol use No  . Drug use: No  . Sexual activity: No

## 2016-06-11 DIAGNOSIS — Z124 Encounter for screening for malignant neoplasm of cervix: Secondary | ICD-10-CM | POA: Diagnosis not present

## 2016-06-11 DIAGNOSIS — N958 Other specified menopausal and perimenopausal disorders: Secondary | ICD-10-CM | POA: Diagnosis not present

## 2016-06-11 DIAGNOSIS — Z1231 Encounter for screening mammogram for malignant neoplasm of breast: Secondary | ICD-10-CM | POA: Diagnosis not present

## 2016-06-11 DIAGNOSIS — Z6834 Body mass index (BMI) 34.0-34.9, adult: Secondary | ICD-10-CM | POA: Diagnosis not present

## 2016-06-12 ENCOUNTER — Encounter: Payer: Self-pay | Admitting: Cardiology

## 2016-06-12 ENCOUNTER — Ambulatory Visit (INDEPENDENT_AMBULATORY_CARE_PROVIDER_SITE_OTHER): Payer: Medicare Other | Admitting: Cardiology

## 2016-06-12 VITALS — BP 124/70 | HR 67 | Ht 62.5 in | Wt 195.8 lb

## 2016-06-12 DIAGNOSIS — I5033 Acute on chronic diastolic (congestive) heart failure: Secondary | ICD-10-CM | POA: Diagnosis not present

## 2016-06-12 DIAGNOSIS — I2510 Atherosclerotic heart disease of native coronary artery without angina pectoris: Secondary | ICD-10-CM | POA: Diagnosis not present

## 2016-06-12 DIAGNOSIS — I10 Essential (primary) hypertension: Secondary | ICD-10-CM

## 2016-06-12 DIAGNOSIS — I2583 Coronary atherosclerosis due to lipid rich plaque: Secondary | ICD-10-CM | POA: Diagnosis not present

## 2016-06-12 DIAGNOSIS — E782 Mixed hyperlipidemia: Secondary | ICD-10-CM | POA: Diagnosis not present

## 2016-06-12 LAB — HM MAMMOGRAPHY

## 2016-06-12 NOTE — Patient Instructions (Signed)

## 2016-06-12 NOTE — Progress Notes (Signed)
Cardiology Office Note    Date:  06/12/2016   ID:  Samantha [name] Mar 06, 1940, MRN [id number]  PCP:  [name], MD  Cardiologist:  [name]  CC: chest pressure  History of Present Illness:  Samantha [name] is a 76 y.o. female with a history of CAD (moderate nonobstructive disease by cardiac CT 10/2014), TIA (on Plavix), chronic diastolic CHF, DM2, HTN, HL, fibromyalgia, palpitations secondary to symptomatic PVCs, LE edema, colon cancer (s/p lap partial colectomy 10/2014), reactive airway disease who presents to clinic for evaluation of chest pressure.  Per review of chart, she has a history of CP in 2016 felt MSK but nuclear stress test was abnormal. She underwent cardiac CT 10/2014 showing 50-69% in the prox and mid LAD. She went on to have unremarkable colon surgery. 2D echo 09/2014: focal basal and moderate concentric hypertrophy, EF 60-65%, grade 1 DD. She saw [name] on 07/07/15 with complaints of dizziness and SOB. This began after remodeling her home with removal of carpet and a lot of dust. EKG was unchanged from prior. BP was 124/68. [name] recommended holding hydralazine and reassessing for need for further testing. CMET 6/7 unremarkable, LDL 73. I saw her in clinic 07/25/15 at which time she was reporting similar sx. Hgb was stable and BNP was negative. CTA neg for PE or other acute disease, no change. Nuc 08/03/15 was normal.   She was last seen by [name] in 01/2016 for follow up. She as doing well with no complaints. Her sx were felt related to reactive airway disease and if they recurred escalation of pulmonary regimen recommended.   06/12/2016 - this is follow-up after cardiac catheterization performed in February 2018 that showed 40% mid RCA stenosis and 50% mid LAD stenosis. The patient continued to have intermittent exertional and resting chest pain that has resolved after she was started on Imdur 30 mg daily. 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 She needs knee replacement but currently with her husband she is not able to schedule it. She's been compliant with her medications and eats mostly healthy cooks for herself. She has ongoing edema in her ankles for last several weeks.   Past Medical History:  Diagnosis Date  . Abnormal CT of the chest 2008   last CT4-l 2009:  . No f/u suggested   . Allergy   . Asthma   . CAD (coronary artery disease)    a. Coronary CTA 10/16: Coronary Ca score 211, mod non-obstructive CAD with LM mild plaque (25-50%), mid LAD 50-69%. b. Neg nuc 06/2015.  . Cataract    BILATERAL-REMOVED  . Chronic diastolic CHF (congestive heart failure) ([place])   . Collagen vascular disease ([place])    "arterial sclerosis" per pt  . Complication of anesthesia    trouble waking up  . Fibromyalgia   . GERD (gastroesophageal reflux disease)   . H/O hiatal hernia   . Heart murmur   . Hyperlipidemia   . Hypertension   . Malignant neoplasm of ascending colon ([place]) 2016   Minimally invasive right hemicolectomy to be done   . Neuromuscular disorder (HCC)    FIBROMYALGIA  . Ocular migraine   . OSA (obstructive sleep apnea) 09/2007   dx w/ a sleep study, not on  CPAP  . Osteoarthritis   .  Osteoporosis   . Pneumonia    "double" in 2004  . PONV (postoperative nausea and vomiting)   . Reactive airway disease 01/29/2002   dx of pseudoasthma / vcd in 2005 and nl sprirometry History of dyspnea, 2011,  improved after several medications were changed around Question of COPD, disproved July 06, 2009 with nl pft's      . Rheumatoid factor positive   . Shingles 11/2009  . Sleep apnea   . Stroke ([place])   . TIA (transient ischemic attack)    x2 - on Plavix for this  . Torn rotator cuff    right worse than left, both are torn  . Tumor, thyroid    partial thyroidectomy in  the 60s  . Type II diabetes mellitus ([place])   . 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 Vaginal dysplasia     Past Surgical History:  Procedure Laterality Date  . ABDOMINAL HYSTERECTOMY  1980   NO oophorectomy per pt   . ANTERIOR CERVICAL DECOMP/DISCECTOMY FUSION  2001   C 3, C4 and C5 plate and screws  . BREAST BIOPSY Right 1999  . BUNIONECTOMY Left ~ 1977  . CATARACT EXTRACTION W/ INTRAOCULAR LENS  IMPLANT, BILATERAL  2012  . COLON SURGERY  10/2014  . EYE SURGERY Bilateral    torq lens for cataracts  . LEFT HEART CATH AND CORONARY ANGIOGRAPHY N/A 03/22/2016   Procedure: Left Heart Cath and Coronary Angiography;  Surgeon: Belva Crome, MD;  Location: [facility];  Service: Cardiovascular;  Laterality: N/A;  . THYROIDECTOMY, PARTIAL  1960's  . VAGINAL MASS EXCISION  1994   "Laser surgery for vaginal cancer; followed by chemotherapy" (06/27/2012)    Current Medications: Outpatient Medications Prior to Visit  Medication Sig Dispense Refill  . acetaminophen (TYLENOL) 500 MG tablet Take 1,000 mg by mouth every 6 (six) hours as needed for moderate pain.    [name] albuterol (PROVENTIL HFA;VENTOLIN HFA) 108 (90 Base) MCG/ACT inhaler Inhale 2 puffs into the lungs every 6 (six) hours as needed for wheezing or shortness of breath. 18 g 3  . atorvastatin (LIPITOR) 20 MG tablet TAKE 1 TABLET (20 MG TOTAL) BY MOUTH DAILY. 90 tablet 2  . budesonide-formoterol (SYMBICORT) 80-4.5 MCG/ACT inhaler Inhale 2 puffs into the lungs 2 (two) times daily. 1 Inhaler 3  . butalbital-acetaminophen-caffeine (FIORICET) 50-325-40 MG tablet Take 1 tablet by mouth every 6 (six) hours as needed for headache or migraine. 14 tablet 5  . CALCIUM PO Take 1 tablet by mouth every morning.     . cetirizine (ZYRTEC) 10 MG tablet TAKE 1 TABLET (10 MG TOTAL) BY MOUTH DAILY. 90 tablet 1  . cholecalciferol (VITAMIN D) 1000 UNITS tablet Take 1,000 Units by mouth every morning.     . cholestyramine (QUESTRAN) 4 g packet Take 4 g by mouth  every other day.    . clopidogrel (PLAVIX) 75 MG tablet Take 1 tablet (75 mg total) by mouth daily. 90 tablet 1  . furosemide (LASIX) 20 MG tablet Take 1 tablet (20 mg total) by mouth daily. 90 tablet 3  . HYDROcodone-acetaminophen (NORCO) 5-325 MG tablet Take 1 tablet by mouth every 4 (four) hours as needed for moderate pain. 40 tablet 0  . hydrocortisone 2.5 % cream Apply topically 2 (two) times daily. 30 g 0  . isosorbide mononitrate (IMDUR) 30 MG 24 hr tablet Take 1 tablet (30 mg total) by mouth daily. 90 tablet 3  . metFORMIN (GLUCOPHAGE-XR) 500 MG 24 hr  tablet Take 1 tablet (500 mg total) by mouth daily with breakfast. 90 tablet 1  . Multiple Vitamin (MULTIVITAMIN) capsule Take 1 capsule by mouth daily.      . nebivolol (BYSTOLIC) 10 MG tablet Take 3 tablets (30 mg total) by mouth daily. 90 tablet 9  . nitroGLYCERIN (NITROSTAT) 0.4 MG SL tablet Place 0.4 mg under the tongue every 5 (five) minutes as needed for chest pain (x 3 doses).    . ondansetron (ZOFRAN ODT) 4 MG disintegrating tablet Take 1 tablet (4 mg total) by mouth every 8 (eight) hours as needed for nausea or vomiting. 12 tablet 1  . Polyethyl Glycol-Propyl Glycol (SYSTANE) 0.4-0.3 % GEL Place 1 drop into both eyes daily as needed (dry eyes).     . Potassium 99 MG TABS Take 99 mg by mouth daily.     . pregabalin (LYRICA) 100 MG capsule Take 1 capsule (100 mg total) by mouth as needed (FIBROMYALGIA). 30 capsule 2  . ranitidine (ZANTAC) 300 MG tablet Take 1 tablet (300 mg total) by mouth at bedtime. 90 tablet 3  . lisinopril (PRINIVIL,ZESTRIL) 20 MG tablet Take 1 tablet (20 mg total) by mouth daily. 90 tablet 3  . nitrofurantoin, macrocrystal-monohydrate, (MACROBID) 100 MG capsule Take 1 capsule (100 mg total) by mouth 2 (two) times daily. (Patient not taking: Reported on 05/30/2016) 14 capsule 0   No facility-administered medications prior to visit.      Allergies:   Bactrim [sulfamethoxazole-trimethoprim]; Cefuroxime axetil;  Oxycodone; Pravastatin; Seldane [terfenadine]; Zocor [simvastatin]; Tramadol; and Tape   Social History   Social History  . Marital status: Married    Spouse name: Ilona Sorrel  . Number of children: 2  . Years of education: masters   Occupational History  . Retired, disable since 2000 Retired   Social History Main Topics  . Smoking status: Former Smoker    Packs/day: 0.25    Years: 5.00    Types: Cigarettes    Quit date: 01/29/1998  . Smokeless tobacco: Never Used     Comment: Quit in 2001  . Alcohol use No  . Drug use: No  . Sexual activity: No   Other Topics Concern  . None   Social History Narrative   On disability since 2000--- also husband has MS   Education. College.   Right handed.     Family History:  The patient's family history includes Allergies in her sister; Aneurysm in her brother; Asthma in her paternal grandmother and sister; Diabetes in her brother and brother; Emphysema in her brother; Heart disease in her brother, father, and mother; Kidney failure in her brother; Lung cancer in her mother; Parkinsonism in her sister; Stroke in her brother, maternal grandmother, and paternal grandmother.     ROS:   Please see the history of present illness.    ROS All other systems reviewed and are negative.   PHYSICAL EXAM:   VS:  BP 124/70   Pulse 67   Ht 5' 2.5" (1.588 m)   Wt 195 lb 12.8 oz (88.8 kg)   SpO2 98%   BMI 35.24 kg/m    GEN: Well nourished, well developed, in no acute distress, obese HEENT: normal  Neck: no JVD, carotid bruits, or masses Cardiac: RRR; no murmurs, rubs, or gallops, mild bilateral edema around the ankles. Respiratory:  clear to auscultation bilaterally, normal work of breathing GI: soft, nontender, nondistended, + BS MS: no deformity or atrophy  Skin: warm and dry, no rash Neuro:  Alert and  Oriented x 3, Strength and sensation are intact Psych: euthymic mood, full affect   Wt Readings from Last 3 Encounters:  06/12/16 195 lb 12.8  oz (88.8 kg)  04/18/16 194 lb 9 oz (88.3 kg)  03/22/16 197 lb (89.4 kg)      Studies/Labs Reviewed:   EKG:  EKG is ordered today.  The ekg ordered today demonstrates NSR with 1st degree AV block. Difficult to interpret given baseline wander  Recent Labs: 07/25/2015: Brain Natriuretic Peptide 31.8; TSH 2.22 04/18/2016: ALT 12; BUN 15; Creatinine, Ser 0.88; Hemoglobin 12.2; Platelets 149.0; Potassium 3.9; Sodium 138   Lipid Panel    Component Value Date/Time   CHOL 169 07/06/2015 0916   TRIG 109 07/06/2015 0916   HDL 74 07/06/2015 0916   CHOLHDL 2.3 07/06/2015 0916   VLDL 22 07/06/2015 0916   LDLCALC 73 07/06/2015 0916   LDLDIRECT 126.2 12/07/2010 1039    Additional studies/ records that were reviewed today include:  Summarized above   ASSESSMENT & PLAN:    CAD/chest pain: known moderate LAD disease by cardiac CT. Low risk nuclear study 07/2015. Left heart cath in favor 2018 showed 40% mid RCA and 50% mid LAD stenosis.  Continue plavix (on for TIA), statin and BB. Will get pre cath labs. She might have component of spasm as her symptoms have significantly improved with Imdur. Will continue. She's also advised to increase her exercise level and joined silver sneakers.  HTN: BP well controlled today  Acute on Chronic diastolic CHF: increase lasix to 20mg  PO BIDfor 1 week then decrease to once daily and afternoon Lasix as needed.  HLD: continue statin. Followed by Dr. Quay Burow, last year old lipids at goal.  Diabetes: borderline controlled. HgA1c 6.5. Follow up with PCP  Colon CA: currently in remission.   Hx of TIA: continue plavix and statin   Medication Adjustments/Labs and Tests Ordered: Current medicines are reviewed at length with the patient today.  Concerns regarding medicines are outlined above.  Medication changes, Labs and Tests ordered today are listed in the Patient Instructions below. Patient Instructions  Medication Instructions:   Your physician recommends  that you continue on your current medications as directed. Please refer to the Current Medication list given to you today.    Follow-Up:  Your physician wants you to follow-up in: [place] will receive a reminder letter in the mail two months in advance. If you don't receive a letter, please call our office to schedule the follow-up appointment.        If you need a refill on your cardiac medications before your next appointment, please call your pharmacy.      Signed, Ena Dawley, MD  06/12/2016 9:08 AM    [facility] [address], [place], [place]  [address] Phone: [phone]; Fax: [phone]

## 2016-06-13 ENCOUNTER — Telehealth: Payer: Self-pay | Admitting: *Deleted

## 2016-06-13 DIAGNOSIS — I2510 Atherosclerotic heart disease of native coronary artery without angina pectoris: Secondary | ICD-10-CM

## 2016-06-13 DIAGNOSIS — I5032 Chronic diastolic (congestive) heart failure: Secondary | ICD-10-CM

## 2016-06-13 DIAGNOSIS — R5383 Other fatigue: Secondary | ICD-10-CM

## 2016-06-13 DIAGNOSIS — R42 Dizziness and giddiness: Secondary | ICD-10-CM

## 2016-06-13 DIAGNOSIS — R0602 Shortness of breath: Secondary | ICD-10-CM

## 2016-06-13 DIAGNOSIS — I10 Essential (primary) hypertension: Secondary | ICD-10-CM

## 2016-06-13 MED ORDER — PREGABALIN 100 MG PO CAPS: 100.0000 mg | ORAL_CAPSULE | ORAL | 2 refills | Status: DC | PRN

## 2016-06-13 NOTE — Telephone Encounter (Signed)
Ok to fill 

## 2016-06-13 NOTE — Telephone Encounter (Signed)
Called refill into [facility] spoke w/pharmacist Rob gave authorization for Lyrica. Notified pt rx has been called into her pharmacy...Samantha [name]

## 2016-06-13 NOTE — Telephone Encounter (Signed)
Pt left msg on triage stating she is needing a refill on her Lyrica due to her fibromyalgia pain. Pharmacy states they have sent request several times no response...Johny Chess

## 2016-08-09 ENCOUNTER — Encounter: Payer: Self-pay | Admitting: Internal Medicine

## 2016-08-09 NOTE — Progress Notes (Signed)
Abstracted and sent to scan  

## 2016-10-18 NOTE — Progress Notes (Signed)
Subjective:    Patient ID: Samantha [name], female    DOB: Apr 25, 1940, 76 y.o.   MRN: [id number]  HPI The patient is here for follow up.  CAD, CHF, Hypertension: She is taking her medication daily. She is compliant with a low sodium diet.  She has occasional chest pain and palpitations but they have improved since being started on imdur.  She has leg edema that is controlled with her lasix.  She has occasional SOB with exertion which has not worsened. She denies regular headaches.   She is very active, not exercising regularly.  She does not monitor her blood pressure at home.    Hyperlipidemia: She is taking her medication daily. She is compliant with a low fat/cholesterol diet. She is not exercising regularly.    Diabetes: She is taking her medication daily as prescribed. She is compliant with a diabetic diet. She is not exercising regularly. She monitors her sugars and they have been running 130 in morning. She checks her feet daily and denies foot lesions. She is up-to-date with an ophthalmology examination.   Postherpetic neuropathy, fibromyalgia:  She is taking lyrica as needed only.   It does work well but it makes her sleep 8 hrs and she can not always take it.   GERD:  She is taking her medication daily as prescribed.  She denies any GERD symptoms and feels her GERD is well controlled.    Hair loss:  She has thinning throughout her head , but the hair is completely gone on the top of her head.  She is interested in seeing derm.   Medications and allergies reviewed with patient and updated if appropriate.  Patient Active Problem List   Diagnosis Date Noted  . Difficulty urinating 04/18/2016  . Lower abdominal pain 04/18/2016  . Paronychia of left ring finger 04/18/2016  . Angina pectoris ([place]) 03/22/2016  . Spondylosis of lumbar region without myelopathy or radiculopathy 02/02/2016  . CAD in native artery 08/22/2015  . Hypertensive heart disease 07/07/2015  . Essential  hypertension 07/07/2015  . SOB (shortness of breath) 07/07/2015  . Dizziness 07/07/2015  . Plantar fasciitis, left 03/22/2015  . Hx of colon cancer, stage I 01/11/2015  . Coronary artery disease due to lipid rich plaque 01/05/2015  . Chronic diastolic CHF (congestive heart failure), NYHA class 2 ([place]) 01/05/2015  . 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 Migraine (Ocular) 01/05/2014  . Gait difficulty 10/06/2013  . Pain in joint, shoulder region 11/27/2012  . VBI (vertebrobasilar insufficiency) 08/22/2012  . Right knee pain 07/29/2012  . TIA (transient ischemic attack) 06/27/2012  . Edema 02/01/2012  . DJD (degenerative joint disease) 02/02/2011  . 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 Postherpetic neuralgia ? 01/02/2010  . NECK PAIN 10/05/2009  . Palpitations 11/23/2008  . UTI'S, RECURRENT 09/28/2008  . Fibromyalgia 08/15/2007  . FATIGUE 11/19/2006  . 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 Hyperlipidemia 05/21/2006  . Reactive airway disease 01/29/2002    Current Outpatient Prescriptions on File Prior to Visit  Medication Sig Dispense Refill  . acetaminophen (TYLENOL) 500 MG tablet Take 1,000 mg by mouth every 6 (six) hours as needed for moderate pain.    [name] albuterol (PROVENTIL HFA;VENTOLIN HFA) 108 (90 Base) MCG/ACT inhaler Inhale 2 puffs into the lungs every 6 (six) hours as needed for wheezing or shortness of breath. 18 g 3  . atorvastatin (LIPITOR) 20  MG tablet TAKE 1 TABLET (20 MG TOTAL) BY MOUTH DAILY. 90 tablet 2  . budesonide-formoterol (SYMBICORT) 80-4.5 MCG/ACT inhaler Inhale 2 puffs into the lungs 2 (two) times daily. 1 Inhaler 3  . butalbital-acetaminophen-caffeine (FIORICET) 50-325-40 MG tablet Take 1 tablet by mouth every 6 (six) hours as needed for headache or migraine. 14 tablet 5  . CALCIUM PO Take 1 tablet by mouth every morning.     . cetirizine (ZYRTEC) 10 MG tablet TAKE 1  TABLET (10 MG TOTAL) BY MOUTH DAILY. 90 tablet 1  . cholecalciferol (VITAMIN D) 1000 UNITS tablet Take 1,000 Units by mouth every morning.     . cholestyramine (QUESTRAN) 4 g packet Take 4 g by mouth every other day.    . clopidogrel (PLAVIX) 75 MG tablet Take 1 tablet (75 mg total) by mouth daily. 90 tablet 1  . furosemide (LASIX) 20 MG tablet Take 1 tablet (20 mg total) by mouth daily. 90 tablet 3  . HYDROcodone-acetaminophen (NORCO) 5-325 MG tablet Take 1 tablet by mouth every 4 (four) hours as needed for moderate pain. 40 tablet 0  . hydrocortisone 2.5 % cream Apply topically 2 (two) times daily. 30 g 0  . metFORMIN (GLUCOPHAGE-XR) 500 MG 24 hr tablet Take 1 tablet (500 mg total) by mouth daily with breakfast. 90 tablet 1  . Multiple Vitamin (MULTIVITAMIN) capsule Take 1 capsule by mouth daily.      . nebivolol (BYSTOLIC) 10 MG tablet Take 3 tablets (30 mg total) by mouth daily. 90 tablet 9  . Polyethyl Glycol-Propyl Glycol (SYSTANE) 0.4-0.3 % GEL Place 1 drop into both eyes daily as needed (dry eyes).     . Potassium 99 MG TABS Take 99 mg by mouth daily.     . pregabalin (LYRICA) 100 MG capsule Take 1 capsule (100 mg total) by mouth as needed (FIBROMYALGIA). 30 capsule 2  . ranitidine (ZANTAC) 300 MG tablet Take 1 tablet (300 mg total) by mouth at bedtime. 90 tablet 3  . isosorbide mononitrate (IMDUR) 30 MG 24 hr tablet Take 1 tablet (30 mg total) by mouth daily. 90 tablet 3  . lisinopril (PRINIVIL,ZESTRIL) 20 MG tablet Take 1 tablet (20 mg total) by mouth daily. 90 tablet 3  . nitroGLYCERIN (NITROSTAT) 0.4 MG SL tablet Place 0.4 mg under the tongue every 5 (five) minutes as needed for chest pain (x 3 doses).     No current facility-administered medications on file prior to visit.     Past Medical History:  Diagnosis Date  . Abnormal CT of the chest 2008   last CT4-l 2009:  . No f/u suggested   . Allergy   . Asthma   . CAD (coronary artery disease)    a. Coronary CTA 10/16: Coronary  Ca score 211, mod non-obstructive CAD with LM mild plaque (25-50%), mid LAD 50-69%. b. Neg nuc 06/2015.  . Cataract    BILATERAL-REMOVED  . Chronic diastolic CHF (congestive heart failure) ([place])   . Collagen vascular disease ([place])    "arterial sclerosis" per pt  . Complication of anesthesia    trouble waking up  . Fibromyalgia   . GERD (gastroesophageal reflux disease)   . H/O hiatal hernia   . Heart murmur   . Hyperlipidemia   . Hypertension   . Malignant neoplasm of ascending colon ([place]) 2016   Minimally invasive right hemicolectomy to be done   . Neuromuscular disorder (H[facility])    FIBROMYALGIA  . Ocular migraine   . OSA (obstructive  sleep apnea) 09/2007   dx w/ a sleep study, not on  CPAP  . Osteoarthritis   . Osteoporosis   . Pneumonia    "double" in 2004  . PONV (postoperative nausea and vomiting)   . Reactive airway disease 01/29/2002   dx of pseudoasthma / vcd in 2005 and nl sprirometry History of dyspnea, 2011,  improved after several medications were changed around Question of COPD, disproved July 06, 2009 with nl pft's      . Rheumatoid factor positive   . Shingles 11/2009  . Sleep apnea   . Stroke ([place])   . TIA (transient ischemic attack)    x2 - on Plavix for this  . Torn rotator cuff    right worse than left, both are torn  . Tumor, 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  . Type II diabetes mellitus ([place])   . 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 Vaginal dysplasia     Past Surgical History:  Procedure Laterality Date  . ABDOMINAL HYSTERECTOMY  1980   NO oophorectomy per pt   . ANTERIOR CERVICAL DECOMP/DISCECTOMY FUSION  2001   C 3, C4 and C5 plate and screws  . BREAST BIOPSY Right 1999  . BUNIONECTOMY Left ~ 1977  . CATARACT EXTRACTION W/ INTRAOCULAR LENS  IMPLANT, BILATERAL  2012  . COLON SURGERY  10/2014  . EYE SURGERY Bilateral    torq lens for cataracts  . LEFT HEART CATH AND CORONARY ANGIOGRAPHY N/A 03/22/2016   Procedure: Left Heart Cath and Coronary  Angiography;  Surgeon: Belva Crome, MD;  Location: [facility];  Service: Cardiovascular;  Laterality: N/A;  . THYROIDECTOMY, PARTIAL  1960's  . VAGINAL MASS EXCISION  1994   "Laser surgery for vaginal cancer; followed by chemotherapy" (06/27/2012)    Social History   Social History  . Marital status: Married    Spouse name: Ilona Sorrel  . Number of children: 2  . Years of education: masters   Occupational History  . Retired, disable since 2000 Retired   Social History Main Topics  . Smoking status: Former Smoker    Packs/day: 0.25    Years: 5.00    Types: Cigarettes    Quit date: 01/29/1998  . Smokeless tobacco: Never Used     Comment: Quit in 2001  . Alcohol use No  . Drug use: No  . Sexual activity: No   Other Topics Concern  . None   Social History Narrative   On disability since 2000--- also husband has MS   Education. College.   Right handed.    Family History  Problem Relation Age of Onset  . Heart disease Father   . Heart disease Mother   . Lung cancer Mother   . Allergies Sister   . Parkinsonism Sister        possible  . Asthma Sister   . Asthma Paternal Grandmother   . Stroke Paternal Grandmother   . Heart disease Unknown        paternal grandparents, maternal grandparents,   . Heart disease Brother   . Emphysema Brother   . Aneurysm Brother        x3  . Kidney failure Brother   . Diabetes Brother   . Diabetes Brother   . Stroke Brother   . Stroke Maternal Grandmother   . Breast cancer Neg Hx   . Colon cancer Neg Hx   . Heart attack Neg Hx  Review of Systems  Constitutional: Negative for chills and fever.  Respiratory: Positive for shortness of breath (occasional, with exertion) and wheezing. Negative for cough (occ dry cough).   Cardiovascular: Positive for chest pain (occasional - improved with Imdur), palpitations (occasional) and leg swelling.  Neurological: Positive for light-headedness (occ, with low/high BP) and headaches  (occasional).       Objective:   Vitals:   10/19/16 1100  BP: 128/70  Pulse: (!) 59  Temp: 98.1 F (36.7 C)  SpO2: 97%   Wt Readings from Last 3 Encounters:  10/19/16 199 lb 4 oz (90.4 kg)  06/12/16 195 lb 12.8 oz (88.8 kg)  04/18/16 194 lb 9 oz (88.3 kg)   Body mass index is 35.86 kg/m.   Physical Exam    Constitutional: Appears well-developed and well-nourished. No distress.  HENT:  Head: Normocephalic and atraumatic.  Neck: Neck supple. No tracheal deviation present. No thyromegaly present.  No cervical lymphadenopathy Cardiovascular: Normal rate, regular rhythm and normal heart sounds.   No murmur heard. No carotid bruit .  No edema Pulmonary/Chest: Effort normal and breath sounds normal. No respiratory distress. No has no wheezes. No rales.  Skin: Skin is warm and dry. Not diaphoretic.  Psychiatric: Normal mood and affect. Behavior is normal.   Diabetic Foot Exam - Simple   Simple Foot Form Diabetic Foot exam was performed with the following findings:  Yes 10/19/2016 11:42 AM  Visual Inspection No deformities, no ulcerations, no other skin breakdown bilaterally:  Yes Sensation Testing Intact to touch and monofilament testing bilaterally:  Yes Pulse Check Posterior Tibialis and Dorsalis pulse intact bilaterally:  Yes Comments Bunion right foot, non tender; mild callus formation right great toe       Assessment & Plan:    See Problem List for Assessment and Plan of chronic medical problems.

## 2016-10-18 NOTE — Progress Notes (Signed)
Pre visit review using our clinic review tool, if applicable. No additional management support is needed unless otherwise documented below in the visit note. 

## 2016-10-18 NOTE — Progress Notes (Signed)
Subjective:   Samantha [name] is a 76 y.o. female who presents for Medicare Annual (Subsequent) preventive examination.  Review of Systems:  No ROS.  Medicare Wellness Visit. Additional risk factors are reflected in the social history.  Cardiac Risk Factors include: advanced age (>60men, >53 women);diabetes mellitus;hypertension;dyslipidemia Sleep patterns: has daytime sleepiness, gets up 1 times nightly to void and sleeps 6 hours nightly.    Home Safety/Smoke Alarms: Feels safe in home. Smoke alarms in place.  Living environment; residence and Firearm Safety: 2-story house, no firearms. Lives with family, no needs for DME, good support system Seat Belt Safety/Bike Helmet: Wears seat belt.     Objective:     Vitals: BP 128/70 (BP Location: Left Arm, Patient Position: Sitting, Cuff Size: Large)   Pulse (!) 59   Temp 98.1 F (36.7 C) (Oral)   Ht 5' 2.5" (1.588 m)   Wt 199 lb 4 oz (90.4 kg)   SpO2 97%   BMI 35.86 kg/m   Body mass index is 35.86 kg/m.   Tobacco History  Smoking Status  . Former Smoker  . Packs/day: 0.25  . Years: 5.00  . Types: Cigarettes  . Quit date: 01/29/1998  Smokeless Tobacco  . Never Used    Comment: Quit in 2001     Counseling given: Not Answered   Past Medical History:  Diagnosis Date  . Abnormal CT of the chest 2008   last CT4-l 2009:  . No f/u suggested   . Allergy   . Asthma   . CAD (coronary artery disease)    a. Coronary CTA 10/16: Coronary Ca score 211, mod non-obstructive CAD with LM mild plaque (25-50%), mid LAD 50-69%. b. Neg nuc 06/2015.  . Cataract    BILATERAL-REMOVED  . Chronic diastolic CHF (congestive heart failure) ([place])   . Collagen vascular disease ([place])    "arterial sclerosis" per pt  . Complication of anesthesia    trouble waking up  . Fibromyalgia   . GERD (gastroesophageal reflux disease)   . H/O hiatal hernia   . Heart murmur   . Hyperlipidemia   . Hypertension   . Malignant neoplasm of ascending colon  ([place]) 2016   Minimally invasive right hemicolectomy to be done   . Neuromuscular disorder (H[facility])    FIBROMYALGIA  . Ocular migraine   . OSA (obstructive sleep apnea) 09/2007   dx w/ a sleep study, not on  CPAP  . Osteoarthritis   . Osteoporosis   . Pneumonia    "double" in 2004  . PONV (postoperative nausea and vomiting)   . Reactive airway disease 01/29/2002   dx of pseudoasthma / vcd in 2005 and nl sprirometry History of dyspnea, 2011,  improved after several medications were changed around Question of COPD, disproved July 06, 2009 with nl pft's      . Rheumatoid factor positive   . Shingles 11/2009  . Sleep apnea   . Stroke ([place])   . TIA (transient ischemic attack)    x2 - on Plavix for this  . Torn rotator cuff    right worse than left, both are torn  . Tumor, 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  . Type II diabetes mellitus ([place])   . 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 Vaginal dysplasia    Past Surgical History:  Procedure Laterality Date  . ABDOMINAL HYSTERECTOMY  1980   NO oophorectomy per pt   . ANTERIOR CERVICAL DECOMP/DISCECTOMY FUSION  2001  C 3, C4 and C5 plate and screws  . BREAST BIOPSY Right 1999  . BUNIONECTOMY Left ~ 1977  . CATARACT EXTRACTION W/ INTRAOCULAR LENS  IMPLANT, BILATERAL  2012  . COLON SURGERY  10/2014  . EYE SURGERY Bilateral    torq lens for cataracts  . LEFT HEART CATH AND CORONARY ANGIOGRAPHY N/A 03/22/2016   Procedure: Left Heart Cath and Coronary Angiography;  Surgeon: Belva Crome, MD;  Location: [facility];  Service: Cardiovascular;  Laterality: N/A;  . THYROIDECTOMY, PARTIAL  1960's  . VAGINAL MASS EXCISION  1994   "Laser surgery for vaginal cancer; followed by chemotherapy" (06/27/2012)   Family History  Problem Relation Age of Onset  . Heart disease Father   . Heart disease Mother   . Lung cancer Mother   . Allergies Sister   . Parkinsonism Sister        possible  . Asthma Sister   . Asthma Paternal Grandmother   .  Stroke Paternal Grandmother   . Heart disease Unknown        paternal grandparents, maternal grandparents,   . Heart disease Brother   . Emphysema Brother   . Aneurysm Brother        x3  . Kidney failure Brother   . Diabetes Brother   . Diabetes Brother   . Stroke Brother   . Stroke Maternal Grandmother   . Breast cancer Neg Hx   . Colon cancer Neg Hx   . Heart attack Neg Hx    History  Sexual Activity  . Sexual activity: No    Outpatient Encounter Prescriptions as of 10/19/2016  Medication Sig  . acetaminophen (TYLENOL) 500 MG tablet Take 1,000 mg by mouth every 6 (six) hours as needed for moderate pain.  [name] albuterol (PROVENTIL HFA;VENTOLIN HFA) 108 (90 Base) MCG/ACT inhaler Inhale 2 puffs into the lungs every 6 (six) hours as needed for wheezing or shortness of breath.  [name] atorvastatin (LIPITOR) 20 MG tablet TAKE 1 TABLET (20 MG TOTAL) BY MOUTH DAILY.  . budesonide-formoterol (SYMBICORT) 80-4.5 MCG/ACT inhaler Inhale 2 puffs into the lungs 2 (two) times daily.  . butalbital-acetaminophen-caffeine (FIORICET) 50-325-40 MG tablet Take 1 tablet by mouth every 6 (six) hours as needed for headache or migraine.  [name] CALCIUM PO Take 1 tablet by mouth every morning.   . cetirizine (ZYRTEC) 10 MG tablet TAKE 1 TABLET (10 MG TOTAL) BY MOUTH DAILY.  . cholecalciferol (VITAMIN D) 1000 UNITS tablet Take 1,000 Units by mouth every morning.   . cholestyramine (QUESTRAN) 4 g packet Take 4 g by mouth every other day.  . clopidogrel (PLAVIX) 75 MG tablet Take 1 tablet (75 mg total) by mouth daily.  . furosemide (LASIX) 20 MG tablet Take 1 tablet (20 mg total) by mouth daily.  [name] HYDROcodone-acetaminophen (NORCO) 5-325 MG tablet Take 1 tablet by mouth every 4 (four) hours as needed for moderate pain.  . hydrocortisone 2.5 % cream Apply topically 2 (two) times daily.  . metFORMIN (GLUCOPHAGE-XR) 500 MG 24 hr tablet Take 1 tablet (500 mg total) by mouth daily with breakfast.  . Multiple Vitamin  (MULTIVITAMIN) capsule Take 1 capsule by mouth daily.    . nebivolol (BYSTOLIC) 10 MG tablet Take 3 tablets (30 mg total) by mouth daily.  Vladimir Faster Glycol-Propyl Glycol (SYSTANE) 0.4-0.3 % GEL Place 1 drop into both eyes daily as needed (dry eyes).   . Potassium 99 MG TABS Take 99 mg by mouth daily.   [name]  pregabalin (LYRICA) 100 MG capsule Take 1 capsule (100 mg total) by mouth as needed (FIBROMYALGIA).  [name] ranitidine (ZANTAC) 300 MG tablet Take 1 tablet (300 mg total) by mouth at bedtime.  . [DISCONTINUED] ondansetron (ZOFRAN ODT) 4 MG disintegrating tablet Take 1 tablet (4 mg total) by mouth every 8 (eight) hours as needed for nausea or vomiting.  . isosorbide mononitrate (IMDUR) 30 MG 24 hr tablet Take 1 tablet (30 mg total) by mouth daily.  [name] lisinopril (PRINIVIL,ZESTRIL) 20 MG tablet Take 1 tablet (20 mg total) by mouth daily.  . nitroGLYCERIN (NITROSTAT) 0.4 MG SL tablet Place 0.4 mg under the tongue every 5 (five) minutes as needed for chest pain (x 3 doses).   No facility-administered encounter medications on file as of 10/19/2016.     Activities of Daily Living In your present state of health, do you have any difficulty performing the following activities: 10/19/2016 03/22/2016  Hearing? N N  Vision? N N  Difficulty concentrating or making decisions? N N  Walking or climbing stairs? Y Y  Dressing or bathing? N N  Doing errands, shopping? N -  Preparing Food and eating ? N -  Using the Toilet? N -  In the past six months, have you accidently leaked urine? N -  Do you have problems with loss of bowel control? N -  Managing your Medications? N -  Managing your Finances? N -  Housekeeping or managing your Housekeeping? N -  Some recent data might be hidden    Patient Care Team: [name], MD as PCP - General (Internal Medicine) [name], MD as Consulting Physician (Ophthalmology) [name], MD as Consulting Physician (Cardiology) [name], MD as  Consulting Physician (Obstetrics and Gynecology) [name], MD as Consulting Physician (General Surgery) Zadie Rhine Clent Demark, MD as Consulting Physician (Ophthalmology) Marybelle Killings, MD as Consulting Physician (Orthopedic Surgery)    Assessment:    Physical assessment deferred to PCP.  Exercise Activities and Dietary recommendations Current Exercise Habits: Home exercise routine, Type of exercise: stretching;calisthenics;walking (chair exercise), Time (Minutes): 40, Frequency (Times/Week): 6, Weekly Exercise (Minutes/Week): 240, Intensity: Mild, Exercise limited by: orthopedic condition(s)  Diet (meal preparation, eat out, water intake, caffeinated beverages, dairy products, fruits and vegetables): in general, a "healthy" diet  , well balanced eats a variety of fruits and vegetables daily, limits salt, fat/cholesterol, sugar, caffeine, drinks 6-8 glasses of water daily.  Reviewed heart healthy and diabetic diet and weight loss tips. Diet education was provided via handout.   Goals    . lose weight          Start to incorporate weights in my exercise routine and do my back and core exercises. 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       Fall Risk Fall Risk  10/19/2016 10/19/2015 09/17/2014 05/19/2014 02/12/2013  Falls in the past year? No No No No No   Depression Screen PHQ 2/9 Scores 10/19/2016 10/19/2015 09/17/2014 05/19/2014  PHQ - 2 Score 0 0 0 0  PHQ- 9 Score 2 - - -     Cognitive Function MMSE - Mini Mental State Exam 10/19/2016  Orientation to time 5  Orientation to Place 5  Registration 3  Attention/ Calculation 5  Recall 2  Language- name 2 objects 2  Language- repeat 1  Language- follow 3 step command 3  Language- read & follow direction 1  Write a sentence 1  Copy  design 1  Total score 29        Immunization History  Administered Date(s) Administered  . Influenza Split 11/30/2010  . Influenza Whole 10/20/2008,  01/02/2010  . Influenza, High Dose Seasonal PF 12/24/2012, 03/17/2015, 10/19/2015, 10/19/2016  . Influenza, Seasonal, Injecte, Preservative Fre 01/01/2012  . Influenza,inj,Quad PF,6+ Mos 11/13/2013  . Pneumococcal Conjugate-13 05/19/2014  . Pneumococcal Polysaccharide-23 01/29/2002, 11/23/2008  . Td 01/29/2001  . Tdap 08/14/2011  . Zoster 12/24/2012   Screening Tests Health Maintenance  Topic Date Due  . MAMMOGRAM  04/18/2016  . HEMOGLOBIN A1C  10/19/2016  . OPHTHALMOLOGY EXAM  03/13/2017  . FOOT EXAM  10/19/2017  . COLONOSCOPY  02/01/2019  . TETANUS/TDAP  08/13/2021  . INFLUENZA VACCINE  Completed  . DEXA SCAN  Completed  . PNA vac Low Risk Adult  Completed      Plan:    Please ask Dr. Julien Girt to send results of mammogram and bone density screenings.   Continue doing brain stimulating activities (puzzles, reading, adult coloring books, staying active) to keep memory sharp.   Continue to eat heart healthy diet (full of fruits, vegetables, whole grains, lean protein, water--limit salt, fat, and sugar intake) and increase physical activity as tolerated.  I have personally reviewed and noted the following in the patient's chart:   . Medical and social history . Use of alcohol, tobacco or illicit drugs  . Current medications and supplements . Functional ability and status . Nutritional status . Physical activity . Advanced directives . List of other physicians . Vitals . Screenings to include cognitive, depression, and falls . Referrals and appointments  In addition, I have reviewed and discussed with patient certain preventive protocols, quality metrics, and best practice recommendations. A written personalized care plan for preventive services as well as general preventive health recommendations were provided to patient.     Michiel Cowboy, RN  10/19/2016   Medical screening examination/treatment/procedure(s) were performed by non-physician practitioner and as  supervising physician I was immediately available for consultation/collaboration. I agree with above. [name], MD

## 2016-10-19 ENCOUNTER — Encounter: Payer: Self-pay | Admitting: Internal Medicine

## 2016-10-19 ENCOUNTER — Other Ambulatory Visit (INDEPENDENT_AMBULATORY_CARE_PROVIDER_SITE_OTHER): Payer: Medicare Other

## 2016-10-19 ENCOUNTER — Ambulatory Visit (INDEPENDENT_AMBULATORY_CARE_PROVIDER_SITE_OTHER): Payer: Medicare Other | Admitting: Internal Medicine

## 2016-10-19 VITALS — BP 128/70 | HR 59 | Temp 98.1°F | Ht 62.5 in | Wt 199.2 lb

## 2016-10-19 DIAGNOSIS — Z794 Long term (current) use of insulin: Secondary | ICD-10-CM | POA: Diagnosis not present

## 2016-10-19 DIAGNOSIS — I2510 Atherosclerotic heart disease of native coronary artery without angina pectoris: Secondary | ICD-10-CM | POA: Diagnosis not present

## 2016-10-19 DIAGNOSIS — B0229 Other postherpetic nervous system involvement: Secondary | ICD-10-CM | POA: Diagnosis not present

## 2016-10-19 DIAGNOSIS — L659 Nonscarring hair loss, unspecified: Secondary | ICD-10-CM | POA: Diagnosis not present

## 2016-10-19 DIAGNOSIS — Z23 Encounter for immunization: Secondary | ICD-10-CM | POA: Diagnosis not present

## 2016-10-19 DIAGNOSIS — E782 Mixed hyperlipidemia: Secondary | ICD-10-CM

## 2016-10-19 DIAGNOSIS — Z0000 Encounter for general adult medical examination without abnormal findings: Secondary | ICD-10-CM

## 2016-10-19 DIAGNOSIS — I10 Essential (primary) hypertension: Secondary | ICD-10-CM | POA: Diagnosis not present

## 2016-10-19 DIAGNOSIS — I5032 Chronic diastolic (congestive) heart failure: Secondary | ICD-10-CM | POA: Diagnosis not present

## 2016-10-19 DIAGNOSIS — E11311 Type 2 diabetes mellitus with unspecified diabetic retinopathy with macular edema: Secondary | ICD-10-CM

## 2016-10-19 DIAGNOSIS — M797 Fibromyalgia: Secondary | ICD-10-CM

## 2016-10-19 LAB — COMPREHENSIVE METABOLIC PANEL
ALT: 15 U/L (ref 0–35)
AST: 16 U/L (ref 0–37)
Albumin: 4.4 g/dL (ref 3.5–5.2)
Alkaline Phosphatase: 49 U/L (ref 39–117)
BILIRUBIN TOTAL: 0.7 mg/dL (ref 0.2–1.2)
BUN: 17 mg/dL (ref 6–23)
CHLORIDE: 103 meq/L (ref 96–112)
CO2: 30 meq/L (ref 19–32)
CREATININE: 0.86 mg/dL (ref 0.40–1.20)
Calcium: 9.5 mg/dL (ref 8.4–10.5)
GFR: 82.85 mL/min (ref >60.00)
GLUCOSE: 110 mg/dL — AB (ref 70–99)
Potassium: 4.4 mEq/L (ref 3.5–5.1)
SODIUM: 139 meq/L (ref 135–145)
Total Protein: 7.3 g/dL (ref 6.0–8.3)

## 2016-10-19 LAB — CBC WITH DIFFERENTIAL/PLATELET
Basophils Absolute: 0 10*3/uL (ref 0.0–0.1)
Basophils Relative: 1 % (ref 0.0–3.0)
EOS PCT: 3.1 % (ref 0.0–5.0)
Eosinophils Absolute: 0.1 10*3/uL (ref 0.0–0.7)
HEMATOCRIT: 40.4 % (ref 36.0–46.0)
Hemoglobin: 13.4 g/dL (ref 12.0–15.0)
LYMPHS PCT: 43.9 % (ref 12.0–46.0)
Lymphs Abs: 2 10*3/uL (ref 0.7–4.0)
MCHC: 33.2 g/dL (ref 30.0–36.0)
MCV: 86.2 fl (ref 78.0–100.0)
MONO ABS: 0.4 10*3/uL (ref 0.1–1.0)
Monocytes Relative: 8.2 % (ref 3.0–12.0)
Neutro Abs: 2 10*3/uL (ref 1.4–7.7)
Neutrophils Relative %: 43.8 % (ref 43.0–77.0)
Platelets: 138 10*3/uL — ABNORMAL LOW (ref 150.0–400.0)
RBC: 4.69 Mil/uL (ref 3.87–5.11)
RDW: 14.8 % (ref 11.5–15.5)
WBC: 4.5 10*3/uL (ref 4.0–10.5)

## 2016-10-19 LAB — HEMOGLOBIN A1C: HEMOGLOBIN A1C: 6.7 % — AB (ref 4.6–6.5)

## 2016-10-19 NOTE — Assessment & Plan Note (Signed)
Continue statin. 

## 2016-10-19 NOTE — Assessment & Plan Note (Signed)
Following with cardiology Has occasional cp and palps, but improved after starting imdur Continue current medications Cmp, cbc

## 2016-10-19 NOTE — Assessment & Plan Note (Signed)
Taking lyrica as needed

## 2016-10-19 NOTE — Assessment & Plan Note (Signed)
Check a1c Continue increased activity Diabetic diet

## 2016-10-19 NOTE — Patient Instructions (Addendum)
  Test(s) ordered today. Your results will be released to [facility] (or called to you) after review, usually within 72hours after test completion. If any changes need to be made, you will be notified at that same time.  All other Health Maintenance issues reviewed.   All recommended immunizations and age-appropriate screenings are up-to-date or discussed.  Flu immunization administered today.   Medications reviewed and updated.  No changes recommended at this time.   A referral was ordered for dermatology  Please followup in 6 months  Please ask Dr. Julien Girt to send results of mammogram and bone density screenings.   Continue doing brain stimulating activities (puzzles, reading, adult coloring books, staying active) to keep memory sharp.   Continue to eat heart healthy diet (full of fruits, vegetables, whole grains, lean protein, water--limit salt, fat, and sugar intake) and increase physical activity as tolerated.   Ms. Coiner , Thank you for taking time to come for your Medicare Wellness Visit. I appreciate your ongoing commitment to your health goals. Please review the following plan we discussed and let me know if I can assist you in the future.   These are the goals we discussed: Goals    . lose weight          Start to incorporate weights in my exercise routine and do my back and core exercises. 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        This is a list of the screening recommended for you and due dates:  Health Maintenance  Topic Date Due  . Mammogram  04/18/2016  . Flu Shot  08/29/2016  . Hemoglobin A1C  10/19/2016  . Eye exam for diabetics  03/13/2017  . Complete foot exam   10/19/2017  . Colon Cancer Screening  02/01/2019  . Tetanus Vaccine  08/13/2021  . DEXA scan (bone density measurement)  Completed  . Pneumonia vaccines  Completed

## 2016-10-19 NOTE — Assessment & Plan Note (Signed)
euvolemic on exam  Continue current medications 

## 2016-10-19 NOTE — Assessment & Plan Note (Signed)
Refer to derm for further evaluation

## 2016-10-19 NOTE — Assessment & Plan Note (Signed)
BP well controlled Current regimen effective and well tolerated Continue current medications at current doses cmp  

## 2016-10-20 ENCOUNTER — Encounter: Payer: Self-pay | Admitting: Internal Medicine

## 2016-10-30 ENCOUNTER — Telehealth: Payer: Self-pay | Admitting: Internal Medicine

## 2016-10-30 NOTE — Telephone Encounter (Signed)
Pt needs refill on her metFORMIN (GLUCOPHAGE-XR) 500 MG 24 hr tablet [[id number]]   Pharmacy on file

## 2016-10-31 MED ORDER — METFORMIN HCL ER 500 MG PO TB24: 500.0000 mg | ORAL_TABLET | Freq: Every day | ORAL | 1 refills | Status: DC

## 2016-10-31 NOTE — Telephone Encounter (Signed)
Reviewed chart pt is up-to-date sent refills to [facility]  

## 2016-11-02 ENCOUNTER — Ambulatory Visit (INDEPENDENT_AMBULATORY_CARE_PROVIDER_SITE_OTHER): Payer: Medicare Other | Admitting: Internal Medicine

## 2016-11-02 ENCOUNTER — Encounter: Payer: Self-pay | Admitting: Internal Medicine

## 2016-11-02 VITALS — BP 162/86 | HR 92 | Temp 99.6°F | Resp 16 | Wt 197.0 lb

## 2016-11-02 DIAGNOSIS — I2510 Atherosclerotic heart disease of native coronary artery without angina pectoris: Secondary | ICD-10-CM

## 2016-11-02 DIAGNOSIS — J4521 Mild intermittent asthma with (acute) exacerbation: Secondary | ICD-10-CM

## 2016-11-02 DIAGNOSIS — J45909 Unspecified asthma, uncomplicated: Secondary | ICD-10-CM | POA: Insufficient documentation

## 2016-11-02 DIAGNOSIS — J209 Acute bronchitis, unspecified: Secondary | ICD-10-CM

## 2016-11-02 MED ORDER — DOXYCYCLINE HYCLATE 100 MG PO TABS: 100.0000 mg | ORAL_TABLET | Freq: Two times a day (BID) | ORAL | 0 refills | Status: DC

## 2016-11-02 MED ORDER — METHYLPREDNISOLONE ACETATE 80 MG/ML IJ SUSP
80.0000 mg | Freq: Once | INTRAMUSCULAR | Status: AC
  Administered 2016-11-02: 80 mg via INTRAMUSCULAR

## 2016-11-02 NOTE — Assessment & Plan Note (Signed)
Reactive airway exacerbation with wheezing on exam and chest tightness Depo-Medrol 80 mg IM 1 Uses Symbicort twice daily consistently and albuterol inhaler as needed Doxycycline for acute bronchitis She will call if there is no improvement or any worsening in her symptoms

## 2016-11-02 NOTE — Progress Notes (Signed)
Subjective:    Patient ID: Samantha [name], female    DOB: September 27, 1940, 76 y.o.   MRN: [id number]  HPI She is here for an acute visit for cold symptoms.   Her symptoms started a couple of days ago.  She is experiencing subjective fever and chills, fatigue, hoarseness, nasal congestion, PND, sinus pain, sore throat, cough, wheezing, SOB, chest tightness, dizziness and headaches. She denies chest pain.Her daughter was sick recently with a sinus infection.  She is taking the zyrtec daily.  She has used her inhalers and they help temporarily.  She has not taken anything else.    Medications and allergies reviewed with patient and updated if appropriate.  Patient Active Problem List   Diagnosis Date Noted  . Hair loss 10/19/2016  . Difficulty urinating 04/18/2016  . Angina pectoris ([place]) 03/22/2016  . Spondylosis of lumbar region without myelopathy or radiculopathy 02/02/2016  . CAD in native artery 08/22/2015  . Hypertensive heart disease 07/07/2015  . Essential hypertension 07/07/2015  . SOB (shortness of breath) 07/07/2015  . Dizziness 07/07/2015  . Plantar fasciitis, left 03/22/2015  . Hx of colon cancer, stage I 01/11/2015  . Coronary artery disease due to lipid rich plaque 01/05/2015  . Chronic diastolic CHF (congestive heart failure), NYHA class 2 ([place]) 01/05/2015  . 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 Migraine (Ocular) 01/05/2014  . Gait difficulty 10/06/2013  . Pain in joint, shoulder region 11/27/2012  . VBI (vertebrobasilar insufficiency) 08/22/2012  . Right knee pain 07/29/2012  . TIA (transient ischemic attack) 06/27/2012  . Edema 02/01/2012  . DJD (degenerative joint disease) 02/02/2011  . 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 Postherpetic neuralgia ? 01/02/2010  . NECK PAIN 10/05/2009  . Palpitations 11/23/2008  . UTI'S, RECURRENT 09/28/2008  . Fibromyalgia 08/15/2007  . DM II (diabetes  mellitus, type II), w/ neuropathy 05/21/2006  . Hyperlipidemia 05/21/2006  . Reactive airway disease 01/29/2002    Current Outpatient Prescriptions on File Prior to Visit  Medication Sig Dispense Refill  . acetaminophen (TYLENOL) 500 MG tablet Take 1,000 mg by mouth every 6 (six) hours as needed for moderate pain.    [name] albuterol (PROVENTIL HFA;VENTOLIN HFA) 108 (90 Base) MCG/ACT inhaler Inhale 2 puffs into the lungs every 6 (six) hours as needed for wheezing or shortness of breath. 18 g 3  . atorvastatin (LIPITOR) 20 MG tablet TAKE 1 TABLET (20 MG TOTAL) BY MOUTH DAILY. 90 tablet 2  . budesonide-formoterol (SYMBICORT) 80-4.5 MCG/ACT inhaler Inhale 2 puffs into the lungs 2 (two) times daily. 1 Inhaler 3  . butalbital-acetaminophen-caffeine (FIORICET) 50-325-40 MG tablet Take 1 tablet by mouth every 6 (six) hours as needed for headache or migraine. 14 tablet 5  . CALCIUM PO Take 1 tablet by mouth every morning.     . cetirizine (ZYRTEC) 10 MG tablet TAKE 1 TABLET (10 MG TOTAL) BY MOUTH DAILY. 90 tablet 1  . cholecalciferol (VITAMIN D) 1000 UNITS tablet Take 1,000 Units by mouth every morning.     . cholestyramine (QUESTRAN) 4 g packet Take 4 g by mouth every other day.    . clopidogrel (PLAVIX) 75 MG tablet Take 1 tablet (75 mg total) by mouth daily. 90 tablet 1  . furosemide (LASIX) 20 MG tablet Take 1 tablet (20 mg total) by mouth daily. 90 tablet 3  . HYDROcodone-acetaminophen (NORCO) 5-325 MG tablet Take 1 tablet by mouth every 4 (four) hours as needed  for moderate pain. 40 tablet 0  . hydrocortisone 2.5 % cream Apply topically 2 (two) times daily. 30 g 0  . metFORMIN (GLUCOPHAGE-XR) 500 MG 24 hr tablet Take 1 tablet (500 mg total) by mouth daily with breakfast. 90 tablet 1  . Multiple Vitamin (MULTIVITAMIN) capsule Take 1 capsule by mouth daily.      . nebivolol (BYSTOLIC) 10 MG tablet Take 3 tablets (30 mg total) by mouth daily. 90 tablet 9  . nitroGLYCERIN (NITROSTAT) 0.4 MG SL tablet  Place 0.4 mg under the tongue every 5 (five) minutes as needed for chest pain (x 3 doses).    Vladimir Faster Glycol-Propyl Glycol (SYSTANE) 0.4-0.3 % GEL Place 1 drop into both eyes daily as needed (dry eyes).     . Potassium 99 MG TABS Take 99 mg by mouth daily.     . pregabalin (LYRICA) 100 MG capsule Take 1 capsule (100 mg total) by mouth as needed (FIBROMYALGIA). 30 capsule 2  . ranitidine (ZANTAC) 300 MG tablet Take 1 tablet (300 mg total) by mouth at bedtime. 90 tablet 3  . isosorbide mononitrate (IMDUR) 30 MG 24 hr tablet Take 1 tablet (30 mg total) by mouth daily. 90 tablet 3  . lisinopril (PRINIVIL,ZESTRIL) 20 MG tablet Take 1 tablet (20 mg total) by mouth daily. 90 tablet 3   No current facility-administered medications on file prior to visit.     Past Medical History:  Diagnosis Date  . Abnormal CT of the chest 2008   last CT4-l 2009:  . No f/u suggested   . Allergy   . Asthma   . CAD (coronary artery disease)    a. Coronary CTA 10/16: Coronary Ca score 211, mod non-obstructive CAD with LM mild plaque (25-50%), mid LAD 50-69%. b. Neg nuc 06/2015.  . Cataract    BILATERAL-REMOVED  . Chronic diastolic CHF (congestive heart failure) ([place])   . Collagen vascular disease ([place])    "arterial sclerosis" per pt  . Complication of anesthesia    trouble waking up  . Fibromyalgia   . GERD (gastroesophageal reflux disease)   . H/O hiatal hernia   . Heart murmur   . Hyperlipidemia   . Hypertension   . Malignant neoplasm of ascending colon ([place]) 2016   Minimally invasive right hemicolectomy to be done   . Neuromuscular disorder ([facility])    FIBROMYALGIA  . Ocular migraine   . OSA (obstructive sleep apnea) 09/2007   dx w/ a sleep study, not on  CPAP  . Osteoarthritis   . Osteoporosis   . Pneumonia    "double" in 2004  . PONV (postoperative nausea and vomiting)   . Reactive airway disease 01/29/2002   dx of pseudoasthma / vcd in 2005 and nl sprirometry History of dyspnea, 2011,  improved  after several medications were changed around Question of COPD, disproved July 06, 2009 with nl pft's      . Rheumatoid factor positive   . Shingles 11/2009  . Sleep apnea   . Stroke ([place])   . TIA (transient ischemic attack)    x2 - on Plavix for this  . Torn rotator cuff    right worse than left, both are torn  . Tumor, 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  . Type II diabetes mellitus ([place])   . 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 Vaginal dysplasia     Past Surgical History:  Procedure Laterality Date  . ABDOMINAL HYSTERECTOMY  1980  NO oophorectomy per pt   . ANTERIOR CERVICAL DECOMP/DISCECTOMY FUSION  2001   C 3, C4 and C5 plate and screws  . BREAST BIOPSY Right 1999  . BUNIONECTOMY Left ~ 1977  . CATARACT EXTRACTION W/ INTRAOCULAR LENS  IMPLANT, BILATERAL  2012  . COLON SURGERY  10/2014  . EYE SURGERY Bilateral    torq lens for cataracts  . LEFT HEART CATH AND CORONARY ANGIOGRAPHY N/A 03/22/2016   Procedure: Left Heart Cath and Coronary Angiography;  Surgeon: Belva Crome, MD;  Location: [facility];  Service: Cardiovascular;  Laterality: N/A;  . THYROIDECTOMY, PARTIAL  1960's  . VAGINAL MASS EXCISION  1994   "Laser surgery for vaginal cancer; followed by chemotherapy" (06/27/2012)    Social History   Social History  . Marital status: Married    Spouse name: Ilona Sorrel  . Number of children: 2  . Years of education: masters   Occupational History  . Retired, disable since 2000 Retired   Social History Main Topics  . Smoking status: Former Smoker    Packs/day: 0.25    Years: 5.00    Types: Cigarettes    Quit date: 01/29/1998  . Smokeless tobacco: Never Used     Comment: Quit in 2001  . Alcohol use No  . Drug use: No  . Sexual activity: No   Other Topics Concern  . None   Social History Narrative   On disability since 2000--- also husband has MS   Education. College.   Right handed.    Family History  Problem Relation Age of Onset  . Heart  disease Father   . Heart disease Mother   . Lung cancer Mother   . Allergies Sister   . Parkinsonism Sister        possible  . Asthma Sister   . Asthma Paternal Grandmother   . Stroke Paternal Grandmother   . Heart disease Unknown        paternal grandparents, maternal grandparents,   . Heart disease Brother   . Emphysema Brother   . Aneurysm Brother        x3  . Kidney failure Brother   . Diabetes Brother   . Diabetes Brother   . Stroke Brother   . Stroke Maternal Grandmother   . Breast cancer Neg Hx   . Colon cancer Neg Hx   . Heart attack Neg Hx     Review of Systems  Constitutional: Positive for chills, fatigue and fever (subjective).  HENT: Positive for congestion, postnasal drip, sinus pain, sore throat and voice change. Negative for ear pain.   Respiratory: Positive for cough (productive), shortness of breath and wheezing.   Cardiovascular: Negative for chest pain.  Gastrointestinal: Positive for diarrhea. Negative for nausea.  Musculoskeletal: Positive for myalgias.  Neurological: Positive for light-headedness and headaches.       Objective:   Vitals:   11/02/16 1114  BP: (!) 162/86  Pulse: 92  Resp: 16  Temp: 99.6 F (37.6 C)  SpO2: 96%   Filed Weights   11/02/16 1114  Weight: 197 lb (89.4 kg)   Body mass index is 35.46 kg/m.  Wt Readings from Last 3 Encounters:  11/02/16 197 lb (89.4 kg)  10/19/16 199 lb 4 oz (90.4 kg)  06/12/16 195 lb 12.8 oz (88.8 kg)     Physical Exam GENERAL APPEARANCE: Appears stated age, well appearing, NAD EYES: conjunctiva clear, no icterus HEENT: bilateral tympanic membranes and ear canals normal, oropharynx with mild  erythema, no thyromegaly, trachea midline, no cervical or supraclavicular lymphadenopathy LUNGS: unlabored breathing, good air entry bilaterally, wheeze on expiration, no rales HEART: Normal S1,S2 without murmurs EXTREMITIES: Without clubbing, cyanosis, or edema      Assessment & Plan:   See  Problem List for Assessment and Plan of chronic medical problems.

## 2016-11-02 NOTE — Patient Instructions (Signed)
  You received a steroid injection.  Take the antibiotic as prescribed.  Use your inhalers as prescribed.    Call if no improvement

## 2016-11-02 NOTE — Assessment & Plan Note (Addendum)
Likely bacterial in nature Will start doxycycline Continue inhalers-take regularly Take Tylenol if needed Rest, fluids She will call if she is not improving

## 2016-11-09 ENCOUNTER — Other Ambulatory Visit: Payer: Self-pay | Admitting: Internal Medicine

## 2016-12-25 ENCOUNTER — Other Ambulatory Visit: Payer: Self-pay | Admitting: Internal Medicine

## 2016-12-25 DIAGNOSIS — R5383 Other fatigue: Secondary | ICD-10-CM

## 2016-12-25 DIAGNOSIS — R0602 Shortness of breath: Secondary | ICD-10-CM

## 2016-12-25 DIAGNOSIS — I5032 Chronic diastolic (congestive) heart failure: Secondary | ICD-10-CM

## 2016-12-25 DIAGNOSIS — R42 Dizziness and giddiness: Secondary | ICD-10-CM

## 2016-12-25 DIAGNOSIS — I2510 Atherosclerotic heart disease of native coronary artery without angina pectoris: Secondary | ICD-10-CM

## 2016-12-25 DIAGNOSIS — I10 Essential (primary) hypertension: Secondary | ICD-10-CM

## 2016-12-25 NOTE — Telephone Encounter (Signed)
[place] Controlled Substance Database checked. Last filled on 10/11/16

## 2016-12-27 ENCOUNTER — Other Ambulatory Visit: Payer: Self-pay | Admitting: Internal Medicine

## 2016-12-31 ENCOUNTER — Encounter: Payer: Self-pay | Admitting: Cardiology

## 2017-01-02 ENCOUNTER — Other Ambulatory Visit: Payer: Self-pay | Admitting: Cardiology

## 2017-01-02 DIAGNOSIS — E11311 Type 2 diabetes mellitus with unspecified diabetic retinopathy with macular edema: Secondary | ICD-10-CM

## 2017-01-02 DIAGNOSIS — I10 Essential (primary) hypertension: Secondary | ICD-10-CM

## 2017-01-02 DIAGNOSIS — E785 Hyperlipidemia, unspecified: Secondary | ICD-10-CM

## 2017-01-02 DIAGNOSIS — Z794 Long term (current) use of insulin: Secondary | ICD-10-CM

## 2017-01-14 ENCOUNTER — Ambulatory Visit (INDEPENDENT_AMBULATORY_CARE_PROVIDER_SITE_OTHER): Payer: Medicare Other | Admitting: Cardiology

## 2017-01-14 ENCOUNTER — Encounter: Payer: Self-pay | Admitting: Cardiology

## 2017-01-14 VITALS — BP 132/68 | HR 77 | Ht 62.5 in | Wt 197.0 lb

## 2017-01-14 DIAGNOSIS — E782 Mixed hyperlipidemia: Secondary | ICD-10-CM

## 2017-01-14 DIAGNOSIS — I2583 Coronary atherosclerosis due to lipid rich plaque: Secondary | ICD-10-CM | POA: Diagnosis not present

## 2017-01-14 DIAGNOSIS — I10 Essential (primary) hypertension: Secondary | ICD-10-CM | POA: Diagnosis not present

## 2017-01-14 DIAGNOSIS — R0789 Other chest pain: Secondary | ICD-10-CM | POA: Diagnosis not present

## 2017-01-14 DIAGNOSIS — I2510 Atherosclerotic heart disease of native coronary artery without angina pectoris: Secondary | ICD-10-CM | POA: Diagnosis not present

## 2017-01-14 MED ORDER — BUTALBITAL-APAP-CAFFEINE 50-325-40 MG PO TABS: 1.0000 | ORAL_TABLET | Freq: Four times a day (QID) | ORAL | 5 refills | Status: DC | PRN

## 2017-01-14 MED ORDER — LISINOPRIL 20 MG PO TABS: 20.0000 mg | ORAL_TABLET | Freq: Every day | ORAL | 3 refills | Status: DC

## 2017-01-14 NOTE — Progress Notes (Signed)
Cardiology Office Note    Date:  01/14/2017   ID:  Samantha [name], Samantha [name] Oct 28, 1940, MRN [id number]  PCP:  [name], MD  Cardiologist:  [name]  CC: chest pressure  History of Present Illness:  Samantha [name] is a 76 y.o. female with a history of CAD (moderate nonobstructive disease by cardiac CT 10/2014), TIA (on Plavix), chronic diastolic CHF, DM2, HTN, HL, fibromyalgia, palpitations secondary to symptomatic PVCs, LE edema, colon cancer (s/p lap partial colectomy 10/2014), reactive airway disease who presents to clinic for evaluation of chest pressure.  Per review of chart, she has a history of CP in 2016 felt MSK but nuclear stress test was abnormal. She underwent cardiac CT 10/2014 showing 50-69% in the prox and mid LAD. She went on to have unremarkable colon surgery. 2D echo 09/2014: focal basal and moderate concentric hypertrophy, EF 60-65%, grade 1 DD. She saw [name] on 07/07/15 with complaints of dizziness and SOB. This began after remodeling her home with removal of carpet and a lot of dust. EKG was unchanged from prior. BP was 124/68. [name] recommended holding hydralazine and reassessing for need for further testing. CMET 6/7 unremarkable, LDL 73. I saw her in clinic 07/25/15 at which time she was reporting similar sx. Hgb was stable and BNP was negative. CTA neg for PE or other acute disease, no change. Nuc 08/03/15 was normal.   She was last seen by [name] in 01/2016 for follow up. She as doing well with no complaints. Her sx were felt related to reactive airway disease and if they recurred escalation of pulmonary regimen recommended.   06/12/2016 - this is follow-up after cardiac catheterization performed in February 2018 that showed 40% mid RCA stenosis and 50% mid LAD stenosis. The patient continued to have intermittent exertional and resting chest pain that has resolved after she was started on Imdur 30 mg daily. 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 She needs knee replacement but currently with her husband she is not able to schedule it. She's been compliant with her medications and eats mostly healthy cooks for herself. She has ongoing edema in her ankles for last several weeks.  01/14/2017 - this is 6 months follow-up, patient is doing well, since he started Imdur her chest pains have improved, she still gets them about twice a week with strenuous exercise. She denies any shortness of breath, she is no lower extremity edema orthopnea or paroxysmal nocturnal dyspnea. She is tolerating atorvastatin well. She's been suffering from ocular migraines with impairment of vision but no headache, one occasion she lost her vision while driving but didn't have accident. 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   Past Medical History:  Diagnosis Date  . Abnormal CT of the chest 2008   last CT4-l 2009:  . No f/u suggested   . Allergy   . Asthma   . CAD (coronary artery disease)    a. Coronary CTA 10/16: Coronary Ca score 211, mod non-obstructive CAD with LM mild plaque (25-50%), mid LAD 50-69%. b. Neg nuc 06/2015.  . Cataract    BILATERAL-REMOVED  . Chronic diastolic CHF (congestive heart failure) ([place])   . Collagen vascular disease ([place])    "arterial sclerosis" per pt  . Complication of anesthesia  trouble waking up  . Fibromyalgia   . GERD (gastroesophageal reflux disease)   . H/O hiatal hernia   . Heart murmur   . Hyperlipidemia   . Hypertension   . Malignant neoplasm of ascending colon ([place]) 2016   Minimally invasive right hemicolectomy to be done   . Neuromuscular disorder (HCC)    FIBROMYALGIA  . Ocular migraine   . OSA (obstructive sleep apnea) 09/2007   dx w/ a sleep study, not on  CPAP  . Osteoarthritis   . Osteoporosis   . Pneumonia     "double" in 2004  . PONV (postoperative nausea and vomiting)   . Reactive airway disease 01/29/2002   dx of pseudoasthma / vcd in 2005 and nl sprirometry History of dyspnea, 2011,  improved after several medications were changed around Question of COPD, disproved July 06, 2009 with nl pft's      . Rheumatoid factor positive   . Shingles 11/2009  . Sleep apnea   . Stroke ([place])   . TIA (transient ischemic attack)    x2 - on Plavix for this  . Torn rotator cuff    right worse than left, both are torn  . Tumor, 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  . Type II diabetes mellitus ([place])   . 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 Vaginal dysplasia     Past Surgical History:  Procedure Laterality Date  . ABDOMINAL HYSTERECTOMY  1980   NO oophorectomy per pt   . ANTERIOR CERVICAL DECOMP/DISCECTOMY FUSION  2001   C 3, C4 and C5 plate and screws  . BREAST BIOPSY Right 1999  . BUNIONECTOMY Left ~ 1977  . CATARACT EXTRACTION W/ INTRAOCULAR LENS  IMPLANT, BILATERAL  2012  . COLON SURGERY  10/2014  . EYE SURGERY Bilateral    torq lens for cataracts  . LEFT HEART CATH AND CORONARY ANGIOGRAPHY N/A 03/22/2016   Procedure: Left Heart Cath and Coronary Angiography;  Surgeon: Belva Crome, MD;  Location: [facility];  Service: Cardiovascular;  Laterality: N/A;  . THYROIDECTOMY, PARTIAL  1960's  . VAGINAL MASS EXCISION  1994   "Laser surgery for vaginal cancer; followed by chemotherapy" (06/27/2012)    Current Medications: Outpatient Medications Prior to Visit  Medication Sig Dispense Refill  . acetaminophen (TYLENOL) 500 MG tablet Take 1,000 mg by mouth every 6 (six) hours as needed for moderate pain.    [name] albuterol (PROVENTIL HFA;VENTOLIN HFA) 108 (90 Base) MCG/ACT inhaler Inhale 2 puffs into the lungs every 6 (six) hours as needed for wheezing or shortness of breath. 18 g 3  . atorvastatin (LIPITOR) 20 MG tablet TAKE 1 TABLET (20 MG TOTAL) BY MOUTH DAILY. 90 tablet 1  . budesonide-formoterol  (SYMBICORT) 80-4.5 MCG/ACT inhaler Inhale 2 puffs into the lungs 2 (two) times daily. 1 Inhaler 3  . CALCIUM PO Take 1 tablet by mouth every morning.     . cetirizine (ZYRTEC) 10 MG tablet TAKE 1 TABLET (10 MG TOTAL) BY MOUTH DAILY. 90 tablet 1  . cholecalciferol (VITAMIN D) 1000 UNITS tablet Take 1,000 Units by mouth every morning.     . cholestyramine (QUESTRAN) 4 g packet Take 4 g by mouth every other day.    . clopidogrel (PLAVIX) 75 MG tablet Take 1 tablet (75 mg total) by mouth daily. 90 tablet 1  . furosemide (LASIX) 20 MG tablet Take 1 tablet (20 mg total) by mouth daily. 90 tablet 3  . HYDROcodone-acetaminophen ([place])  5-325 MG tablet Take 1 tablet by mouth every 4 (four) hours as needed for moderate pain. 40 tablet 0  . hydrocortisone 2.5 % cream Apply topically 2 (two) times daily. 30 g 0  . LYRICA 100 MG capsule TAKE ONE CAPSULE BY MOUTH EVERY DAY AS NEEDED FOR PAIN 30 capsule 2  . metFORMIN (GLUCOPHAGE-XR) 500 MG 24 hr tablet Take 1 tablet (500 mg total) by mouth daily with breakfast. 90 tablet 1  . Multiple Vitamin (MULTIVITAMIN) capsule Take 1 capsule by mouth daily.      . nebivolol (BYSTOLIC) 10 MG tablet Take 3 tablets (30 mg total) by mouth daily. 90 tablet 9  . nitroGLYCERIN (NITROSTAT) 0.4 MG SL tablet Place 0.4 mg under the tongue every 5 (five) minutes as needed for chest pain (x 3 doses).    Vladimir Faster Glycol-Propyl Glycol (SYSTANE) 0.4-0.3 % GEL Place 1 drop into both eyes daily as needed (dry eyes).     . Potassium 99 MG TABS Take 99 mg by mouth daily.     . ranitidine (ZANTAC) 300 MG tablet TAKE 1 TABLET(300 MG) BY MOUTH AT BEDTIME 90 tablet 1  . butalbital-acetaminophen-caffeine (FIORICET) 50-325-40 MG tablet Take 1 tablet by mouth every 6 (six) hours as needed for headache or migraine. 14 tablet 5  . isosorbide mononitrate (IMDUR) 30 MG 24 hr tablet Take 1 tablet (30 mg total) by mouth daily. 90 tablet 3  . clopidogrel (PLAVIX) 75 MG tablet Take 1 tablet (75 mg total)  by mouth daily. (Patient not taking: Reported on 01/14/2017) 90 tablet 1  . doxycycline (VIBRA-TABS) 100 MG tablet Take 1 tablet (100 mg total) by mouth 2 (two) times daily. (Patient not taking: Reported on 01/14/2017) 20 tablet 0  . lisinopril (PRINIVIL,ZESTRIL) 20 MG tablet Take 1 tablet (20 mg total) by mouth daily. 90 tablet 3   No facility-administered medications prior to visit.      Allergies:   Bactrim [sulfamethoxazole-trimethoprim]; Cefuroxime axetil; Oxycodone; Pravastatin; Seldane [terfenadine]; Zocor [simvastatin]; Tramadol; and Tape   Social History   Socioeconomic History  . Marital status: Married    Spouse name: Ilona Sorrel  . Number of children: 2  . Years of education: masters  . Highest education level: None  Social Needs  . Financial resource strain: None  . Food insecurity - worry: None  . Food insecurity - inability: None  . Transportation needs - medical: None  . Transportation needs - non-medical: None  Occupational History  . Occupation: Retired, disable since 2000    Employer: RETIRED  Tobacco Use  . Smoking status: Former Smoker    Packs/day: 0.25    Years: 5.00    Pack years: 1.25    Types: Cigarettes    Last attempt to quit: 01/29/1998    Years since quitting: 18.9  . Smokeless tobacco: Never Used  . Tobacco comment: Quit in 2001  Substance and Sexual Activity  . Alcohol use: No    Alcohol/week: 0.0 oz  . Drug use: No  . Sexual activity: No  Other Topics Concern  . None  Social History Narrative   On disability since 2000--- also husband has MS   Education. College.   Right handed.     Family History:  The patient's family history includes Allergies in her sister; Aneurysm in her brother; Asthma in her paternal grandmother and sister; Diabetes in her brother and brother; Emphysema in her brother; Heart disease in her brother, father, mother, and unknown relative; Kidney failure in her brother;  Lung cancer in her mother; Parkinsonism in her  sister; Stroke in her brother, maternal grandmother, and paternal grandmother.     ROS:   Please see the history of present illness.    ROS All other systems reviewed and are negative.   PHYSICAL EXAM:   VS:  BP 132/68   Pulse 77   Ht 5' 2.5" (1.588 m)   Wt 197 lb (89.4 kg)   SpO2 97%   BMI 35.46 kg/m    GEN: Well nourished, well developed, in no acute distress, obese HEENT: normal  Neck: no JVD, carotid bruits, or masses Cardiac: RRR; no murmurs, rubs, or gallops, mild bilateral edema around the ankles. Respiratory:  clear to auscultation bilaterally, normal work of breathing GI: soft, nontender, nondistended, + BS MS: no deformity or atrophy  Skin: warm and dry, no rash Neuro:  Alert and Oriented x 3, Strength and sensation are intact Psych: euthymic mood, full affect   Wt Readings from Last 3 Encounters:  01/14/17 197 lb (89.4 kg)  11/02/16 197 lb (89.4 kg)  10/19/16 199 lb 4 oz (90.4 kg)      Studies/Labs Reviewed:   EKG:  EKG is not ordered today.    Recent Labs: 10/19/2016: ALT 15; BUN 17; Creatinine, Ser 0.86; Hemoglobin 13.4; Platelets 138.0; Potassium 4.4; Sodium 139   Lipid Panel    Component Value Date/Time   CHOL 169 07/06/2015 0916   TRIG 109 07/06/2015 0916   HDL 74 07/06/2015 0916   CHOLHDL 2.3 07/06/2015 0916   VLDL 22 07/06/2015 0916   LDLCALC 73 07/06/2015 0916   LDLDIRECT 126.2 12/07/2010 1039    Additional studies/ records that were reviewed today include:  Summarized above   ASSESSMENT & PLAN:    CAD/chest pain: known moderate LAD disease by cardiac CT. Low risk nuclear study 07/2015. Left heart cath in 03/2016 showed 40% mid RCA and 50% mid LAD stenosis.  Continue plavix (on for TIA), statin and BB. She might have component of spasm as her symptoms have significantly improved with Imdur. Will continue. She is complimented exercising frequently.  HTN: BP well controlled today  Acute on Chronic diastolic CHF: Continue Lasix 20 mg daily.  Labs in October 2018 all normal. Crea 0.86.  HLD: continue statin. Followed by Dr. Quay Burow, last year old lipids at goal.  Diabetes: borderline controlled. HgA1c 6.5. Follow up with PCP  Colon CA: currently in remission.   Hx of TIA: continue plavix and statin   Medication Adjustments/Labs and Tests Ordered: Current medicines are reviewed at length with the patient today.  Concerns regarding medicines are outlined above.  Medication changes, Labs and Tests ordered today are listed in the Patient Instructions below. Patient Instructions  Medication Instructions:   Your physician recommends that you continue on your current medications as directed. Please refer to the Current Medication list given to you today.      Follow-Up:  Your physician wants you to follow-up in: [place] will receive a reminder letter in the mail two months in advance. If you don't receive a letter, please call our office to schedule the follow-up appointment.        If you need a refill on your cardiac medications before your next appointment, please call your pharmacy.      Signed, Ena Dawley, MD  01/14/2017 4:41 PM    [facility] [address], [place], [place]  [address] Phone: [phone]; Fax: [phone]

## 2017-01-14 NOTE — Patient Instructions (Signed)

## 2017-01-15 DIAGNOSIS — E119 Type 2 diabetes mellitus without complications: Secondary | ICD-10-CM | POA: Diagnosis not present

## 2017-01-15 DIAGNOSIS — G43109 Migraine with aura, not intractable, without status migrainosus: Secondary | ICD-10-CM | POA: Diagnosis not present

## 2017-01-15 DIAGNOSIS — H43812 Vitreous degeneration, left eye: Secondary | ICD-10-CM | POA: Diagnosis not present

## 2017-01-15 DIAGNOSIS — H04123 Dry eye syndrome of bilateral lacrimal glands: Secondary | ICD-10-CM | POA: Diagnosis not present

## 2017-01-25 ENCOUNTER — Other Ambulatory Visit: Payer: Self-pay | Admitting: Emergency Medicine

## 2017-01-25 MED ORDER — CETIRIZINE HCL 10 MG PO TABS: 10.0000 mg | ORAL_TABLET | Freq: Every day | ORAL | 1 refills | Status: DC

## 2017-02-07 ENCOUNTER — Ambulatory Visit (INDEPENDENT_AMBULATORY_CARE_PROVIDER_SITE_OTHER): Payer: Medicare Other | Admitting: Internal Medicine

## 2017-02-07 ENCOUNTER — Encounter: Payer: Self-pay | Admitting: Internal Medicine

## 2017-02-07 VITALS — BP 124/82 | HR 64 | Temp 98.0°F | Resp 16 | Wt 197.0 lb

## 2017-02-07 DIAGNOSIS — J4530 Mild persistent asthma, uncomplicated: Secondary | ICD-10-CM

## 2017-02-07 DIAGNOSIS — G43109 Migraine with aura, not intractable, without status migrainosus: Secondary | ICD-10-CM

## 2017-02-07 MED ORDER — METHYLPREDNISOLONE ACETATE 80 MG/ML IJ SUSP
80.0000 mg | Freq: Once | INTRAMUSCULAR | Status: AC
  Administered 2017-02-07: 80 mg via INTRAMUSCULAR

## 2017-02-07 MED ORDER — ALBUTEROL SULFATE HFA 108 (90 BASE) MCG/ACT IN AERS: 2.0000 | INHALATION_SPRAY | Freq: Four times a day (QID) | RESPIRATORY_TRACT | 3 refills | Status: DC | PRN

## 2017-02-07 MED ORDER — PROMETHAZINE HCL 25 MG PO TABS: 25.0000 mg | ORAL_TABLET | Freq: Three times a day (TID) | ORAL | 0 refills | Status: DC | PRN

## 2017-02-07 NOTE — Progress Notes (Signed)
Subjective:    Patient ID: Samantha [name], female    DOB: 02-16-1940, 77 y.o.   MRN: [id number]  HPI She is here for an acute visit.   She had a recent sinus infection.  Her sinuses are better but not clearing up.  She has a runny nose.  She is taking zyrtec daily.  She has minimal sinus pressure, left ear tinnitus, mild cough, SOB and wheeze.  She is using her inhalers.  Migraine headache:  She has had occular migraines, but typically one at a time only and 2 days ago- she had 7 episodes in one day and she did have some headache, that progressively worsened.  The pain was more on the left side.  She felt dizzy and off balanced. She was nauseous at times.  Since then she has had a continuous headache.  Her entire head hurts. She did take the Fioricet and it has helped.  She still has a headache 5/10.      Medications and allergies reviewed with patient and updated if appropriate.  Patient Active Problem List   Diagnosis Date Noted  . Reactive airway disease with wheezing 11/02/2016  . Acute bronchitis 11/02/2016  . Hair loss 10/19/2016  . Difficulty urinating 04/18/2016  . Angina pectoris ([place]) 03/22/2016  . Spondylosis of lumbar region without myelopathy or radiculopathy 02/02/2016  . CAD in native artery 08/22/2015  . Hypertensive heart disease 07/07/2015  . Essential hypertension 07/07/2015  . SOB (shortness of breath) 07/07/2015  . Dizziness 07/07/2015  . Plantar fasciitis, left 03/22/2015  . Hx of colon cancer, stage I 01/11/2015  . Coronary artery disease due to lipid rich plaque 01/05/2015  . Chronic diastolic CHF (congestive heart failure), NYHA class 2 ([place]) 01/05/2015  . 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 Migraine (Ocular) 01/05/2014  . Gait difficulty 10/06/2013  . Pain in joint, shoulder region 11/27/2012  . VBI (vertebrobasilar insufficiency) 08/22/2012  . Right knee pain 07/29/2012  . TIA  (transient ischemic attack) 06/27/2012  . Edema 02/01/2012  . DJD (degenerative joint disease) 02/02/2011  . 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 Postherpetic neuralgia ? 01/02/2010  . NECK PAIN 10/05/2009  . Palpitations 11/23/2008  . UTI'S, RECURRENT 09/28/2008  . Fibromyalgia 08/15/2007  . 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 Hyperlipidemia 05/21/2006  . Reactive airway disease 01/29/2002    Current Outpatient Medications on File Prior to Visit  Medication Sig Dispense Refill  . acetaminophen (TYLENOL) 500 MG tablet Take 1,000 mg by mouth every 6 (six) hours as needed for moderate pain.    [name] albuterol (PROVENTIL HFA;VENTOLIN HFA) 108 (90 Base) MCG/ACT inhaler Inhale 2 puffs into the lungs every 6 (six) hours as needed for wheezing or shortness of breath. 18 g 3  . atorvastatin (LIPITOR) 20 MG tablet TAKE 1 TABLET (20 MG TOTAL) BY MOUTH DAILY. 90 tablet 1  . budesonide-formoterol (SYMBICORT) 80-4.5 MCG/ACT inhaler Inhale 2 puffs into the lungs 2 (two) times daily. 1 Inhaler 3  . butalbital-acetaminophen-caffeine (FIORICET) 50-325-40 MG tablet Take 1 tablet by mouth every 6 (six) hours as needed for headache or migraine. 30 tablet 5  . CALCIUM PO Take 1 tablet by mouth every morning.     . cetirizine (ZYRTEC) 10 MG tablet Take 1 tablet (10 mg total) by mouth daily. 90 tablet 1  . cholecalciferol (VITAMIN D) 1000 UNITS tablet Take 1,000 Units by mouth every  morning.     . cholestyramine (QUESTRAN) 4 g packet Take 4 g by mouth every other day.    . clopidogrel (PLAVIX) 75 MG tablet Take 1 tablet (75 mg total) by mouth daily. 90 tablet 1  . furosemide (LASIX) 20 MG tablet Take 1 tablet (20 mg total) by mouth daily. 90 tablet 3  . HYDROcodone-acetaminophen ([place]) 5-325 MG tablet Take 1 tablet by mouth every 4 (four) hours as needed for moderate pain. 40 tablet 0  . hydrocortisone 2.5 % cream Apply topically 2 (two) times daily. 30 g 0  . lisinopril  (PRINIVIL,ZESTRIL) 20 MG tablet Take 1 tablet (20 mg total) by mouth daily. 90 tablet 3  . LYRICA 100 MG capsule TAKE ONE CAPSULE BY MOUTH EVERY DAY AS NEEDED FOR PAIN 30 capsule 2  . metFORMIN (GLUCOPHAGE-XR) 500 MG 24 hr tablet Take 1 tablet (500 mg total) by mouth daily with breakfast. 90 tablet 1  . Multiple Vitamin (MULTIVITAMIN) capsule Take 1 capsule by mouth daily.      . nebivolol (BYSTOLIC) 10 MG tablet Take 3 tablets (30 mg total) by mouth daily. 90 tablet 9  . nitroGLYCERIN (NITROSTAT) 0.4 MG SL tablet Place 0.4 mg under the tongue every 5 (five) minutes as needed for chest pain (x 3 doses).    Vladimir Faster Glycol-Propyl Glycol (SYSTANE) 0.4-0.3 % GEL Place 1 drop into both eyes daily as needed (dry eyes).     . Potassium 99 MG TABS Take 99 mg by mouth daily.     . ranitidine (ZANTAC) 300 MG tablet TAKE 1 TABLET(300 MG) BY MOUTH AT BEDTIME 90 tablet 1  . isosorbide mononitrate (IMDUR) 30 MG 24 hr tablet Take 1 tablet (30 mg total) by mouth daily. 90 tablet 3   No current facility-administered medications on file prior to visit.     Past Medical History:  Diagnosis Date  . Abnormal CT of the chest 2008   last CT4-l 2009:  . No f/u suggested   . Allergy   . Asthma   . CAD (coronary artery disease)    a. Coronary CTA 10/16: Coronary Ca score 211, mod non-obstructive CAD with LM mild plaque (25-50%), mid LAD 50-69%. b. Neg nuc 06/2015.  . Cataract    BILATERAL-REMOVED  . Chronic diastolic CHF (congestive heart failure) ([place])   . Collagen vascular disease ([place])    "arterial sclerosis" per pt  . Complication of anesthesia    trouble waking up  . Fibromyalgia   . GERD (gastroesophageal reflux disease)   . H/O hiatal hernia   . Heart murmur   . Hyperlipidemia   . Hypertension   . Malignant neoplasm of ascending colon ([place]) 2016   Minimally invasive right hemicolectomy to be done   . Neuromuscular disorder (HCC)    FIBROMYALGIA  . Ocular migraine   . OSA (obstructive sleep  apnea) 09/2007   dx w/ a sleep study, not on  CPAP  . Osteoarthritis   . Osteoporosis   . Pneumonia    "double" in 2004  . PONV (postoperative nausea and vomiting)   . Reactive airway disease 01/29/2002   dx of pseudoasthma / vcd in 2005 and nl sprirometry History of dyspnea, 2011,  improved after several medications were changed around Question of COPD, disproved July 06, 2009 with nl pft's      . Rheumatoid factor positive   . Shingles 11/2009  . Sleep apnea   . Stroke ([place])   . TIA (transient ischemic  attack)    x2 - on Plavix for this  . Torn rotator cuff    right worse than left, both are torn  . Tumor, 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  . Type II diabetes mellitus ([place])   . 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 Vaginal dysplasia     Past Surgical History:  Procedure Laterality Date  . ABDOMINAL HYSTERECTOMY  1980   NO oophorectomy per pt   . ANTERIOR CERVICAL DECOMP/DISCECTOMY FUSION  2001   C 3, C4 and C5 plate and screws  . BREAST BIOPSY Right 1999  . BUNIONECTOMY Left ~ 1977  . CATARACT EXTRACTION W/ INTRAOCULAR LENS  IMPLANT, BILATERAL  2012  . COLON SURGERY  10/2014  . EYE SURGERY Bilateral    torq lens for cataracts  . LEFT HEART CATH AND CORONARY ANGIOGRAPHY N/A 03/22/2016   Procedure: Left Heart Cath and Coronary Angiography;  Surgeon: Belva Crome, MD;  Location: [facility];  Service: Cardiovascular;  Laterality: N/A;  . THYROIDECTOMY, PARTIAL  1960's  . VAGINAL MASS EXCISION  1994   "Laser surgery for vaginal cancer; followed by chemotherapy" (06/27/2012)    Social History   Socioeconomic History  . Marital status: Married    Spouse name: Ilona Sorrel  . Number of children: 2  . Years of education: masters  . Highest education level: None  Social Needs  . Financial resource strain: None  . Food insecurity - worry: None  . Food insecurity - inability: None  . Transportation needs - medical: None  . Transportation needs - non-medical: None    Occupational History  . Occupation: Retired, disable since 2000    Employer: RETIRED  Tobacco Use  . Smoking status: Former Smoker    Packs/day: 0.25    Years: 5.00    Pack years: 1.25    Types: Cigarettes    Last attempt to quit: 01/29/1998    Years since quitting: 19.0  . Smokeless tobacco: Never Used  . Tobacco comment: Quit in 2001  Substance and Sexual Activity  . Alcohol use: No    Alcohol/week: 0.0 oz  . Drug use: No  . Sexual activity: No  Other Topics Concern  . None  Social History Narrative   On disability since 2000--- also husband has MS   Education. College.   Right handed.    Family History  Problem Relation Age of Onset  . Heart disease Father   . Heart disease Mother   . Lung cancer Mother   . Allergies Sister   . Parkinsonism Sister        possible  . Asthma Sister   . Asthma Paternal Grandmother   . Stroke Paternal Grandmother   . Heart disease Unknown        paternal grandparents, maternal grandparents,   . Heart disease Brother   . Emphysema Brother   . Aneurysm Brother        x3  . Kidney failure Brother   . Diabetes Brother   . Diabetes Brother   . Stroke Brother   . Stroke Maternal Grandmother   . Breast cancer Neg Hx   . Colon cancer Neg Hx   . Heart attack Neg Hx     Review of Systems  Constitutional: Negative for chills and fever.  HENT: Positive for rhinorrhea, sinus pressure (mild) and tinnitus (left ear). Negative for congestion, ear pain and sore throat.   Respiratory: Positive for cough (dry), shortness of breath and  wheezing.   Gastrointestinal: Positive for nausea.  Neurological: Positive for dizziness and headaches. Negative for weakness and numbness.       Objective:   Vitals:   02/07/17 1107  BP: 124/82  Pulse: 64  Resp: 16  Temp: 98 F (36.7 C)  SpO2: 97%   Wt Readings from Last 3 Encounters:  02/07/17 197 lb (89.4 kg)  01/14/17 197 lb (89.4 kg)  11/02/16 197 lb (89.4 kg)   Body mass index is 35.46  kg/m.   Physical Exam    Constitutional: Appears well-developed and well-nourished. No distress.  HENT:  Head: Normocephalic and atraumatic.  Neck: Neck supple. No tracheal deviation present. No thyromegaly present.  No cervical lymphadenopathy Cardiovascular: Normal rate, regular rhythm and normal heart sounds.   No murmur heard. No carotid bruit .  No edema Pulmonary/Chest: Effort normal and breath sounds normal. No respiratory distress. No has no wheezes. No rales.  Neurological: Nonfocal Skin: Skin is warm and dry. Not diaphoretic.  Psychiatric: Normal mood and affect. Behavior is normal.      Assessment & Plan:    See Problem List for Assessment and Plan of chronic medical problems.

## 2017-02-07 NOTE — Patient Instructions (Signed)
You had a steroid injection today for your airway and migraine.  Phenergan was sent to your pharmacy - use this for nausea and your migraines.    Continue your inhalers, nasal spray and allergy medications.   Call if no improvement

## 2017-02-07 NOTE — Assessment & Plan Note (Signed)
With mild exacerbation Coughing, shortness of breath, wheezing Taking Symbicort and albuterol We will give her Depo-Medrol for her migraine is intractable and hopefully this will help her reactive airway disease Albuterol refilled Continue allergy medication Call if no improvement

## 2017-02-07 NOTE — Assessment & Plan Note (Signed)
She recently had several ocular migraines in 1 day-7 and since then has had a persistent migraine that is intractable Fioricet with only some improvement Depo-Medrol 80 mg IM x1 We will give her a prescription for Phenergan to use at home as needed Can take Fioricet as needed Call if no improvement

## 2017-02-18 DIAGNOSIS — Z23 Encounter for immunization: Secondary | ICD-10-CM | POA: Diagnosis not present

## 2017-02-18 DIAGNOSIS — L658 Other specified nonscarring hair loss: Secondary | ICD-10-CM | POA: Diagnosis not present

## 2017-03-13 NOTE — Progress Notes (Signed)
Subjective:    Patient ID: Samantha [name], female    DOB: 1940-11-24, 77 y.o.   MRN: [id number]  HPI The patient is here for an acute visit.   She fell last week, 10 days ago.  She was outside and near the car and when she turned and the next thing she knew she was on the ground.  She does not remember how she fell.  She landed on both knees.  She hit her head on the trash can, right shoulder, right hip and she turned her right ankle.   She has been taking tylenol and rubbing alcohol on the affected areas with some improvement.   She has put on an ankle brace.  She has pain in the ankle with walking-the pain is on the lateral aspect of the ankle.  The ankle is weak.    She has right arm and shoulder pain.  She is having difficulty raising her right arm.  She has chronic shoulder issues at one point thought she needed surgery, but since the fall the pain is worse.  The decreased range of motion is also worse.  She has difficulty combing her hair.  She feels she is stumbling over her right foot.  She sometimes feels she is not able to lift it completely.  She has stumbled more than once in the kitchen in a day-to-day basis.  She has tried changing her footwear.   She fell one other time two months ago and it occurred when she was turning.  She did not injure herself.    Her balance is poor and getting worse.     The other day she was looking up taking a drink of water and almost fell.      Medications and allergies reviewed with patient and updated if appropriate.  Patient Active Problem List   Diagnosis Date Noted  . Hair loss 10/19/2016  . Difficulty urinating 04/18/2016  . Angina pectoris ([place]) 03/22/2016  . Spondylosis of lumbar region without myelopathy or radiculopathy 02/02/2016  . CAD in native artery 08/22/2015  . Hypertensive heart disease 07/07/2015  . Essential hypertension 07/07/2015  . SOB (shortness of breath) 07/07/2015  . Dizziness 07/07/2015  . Plantar  fasciitis, left 03/22/2015  . Hx of colon cancer, stage I 01/11/2015  . Coronary artery disease due to lipid rich plaque 01/05/2015  . Chronic diastolic CHF (congestive heart failure), NYHA class 2 ([place]) 01/05/2015  . 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 Migraine (Ocular) 01/05/2014  . Gait difficulty 10/06/2013  . Pain in joint, shoulder region 11/27/2012  . VBI (vertebrobasilar insufficiency) 08/22/2012  . Right knee pain 07/29/2012  . TIA (transient ischemic attack) 06/27/2012  . Edema 02/01/2012  . DJD (degenerative joint disease) 02/02/2011  . 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 Postherpetic neuralgia ? 01/02/2010  . NECK PAIN 10/05/2009  . Palpitations 11/23/2008  . UTI'S, RECURRENT 09/28/2008  . Fibromyalgia 08/15/2007  . 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 Hyperlipidemia 05/21/2006  . Reactive airway disease 01/29/2002    Current Outpatient Medications on File Prior to Visit  Medication Sig Dispense Refill  . acetaminophen (TYLENOL) 500 MG tablet Take 1,000 mg by mouth every 6 (six) hours as needed for moderate pain.    [name] albuterol (PROVENTIL HFA;VENTOLIN HFA) 108 (90 Base) MCG/ACT inhaler Inhale 2 puffs into the lungs every 6 (six) hours as needed for wheezing or  shortness of breath. 18 g 3  . atorvastatin (LIPITOR) 20 MG tablet TAKE 1 TABLET (20 MG TOTAL) BY MOUTH DAILY. 90 tablet 1  . budesonide-formoterol (SYMBICORT) 80-4.5 MCG/ACT inhaler Inhale 2 puffs into the lungs 2 (two) times daily. 1 Inhaler 3  . butalbital-acetaminophen-caffeine (FIORICET) 50-325-40 MG tablet Take 1 tablet by mouth every 6 (six) hours as needed for headache or migraine. 30 tablet 5  . CALCIUM PO Take 1 tablet by mouth every morning.     . cetirizine (ZYRTEC) 10 MG tablet Take 1 tablet (10 mg total) by mouth daily. 90 tablet 1  . cholecalciferol (VITAMIN D) 1000 UNITS tablet Take 1,000 Units by mouth every  morning.     . cholestyramine (QUESTRAN) 4 g packet Take 4 g by mouth every other day.    . clopidogrel (PLAVIX) 75 MG tablet Take 1 tablet (75 mg total) by mouth daily. 90 tablet 1  . furosemide (LASIX) 20 MG tablet Take 1 tablet (20 mg total) by mouth daily. 90 tablet 3  . hydrocortisone 2.5 % cream Apply topically 2 (two) times daily. 30 g 0  . lisinopril (PRINIVIL,ZESTRIL) 20 MG tablet Take 1 tablet (20 mg total) by mouth daily. 90 tablet 3  . LYRICA 100 MG capsule TAKE ONE CAPSULE BY MOUTH EVERY DAY AS NEEDED FOR PAIN 30 capsule 2  . metFORMIN (GLUCOPHAGE-XR) 500 MG 24 hr tablet Take 1 tablet (500 mg total) by mouth daily with breakfast. 90 tablet 1  . Multiple Vitamin (MULTIVITAMIN) capsule Take 1 capsule by mouth daily.      . nebivolol (BYSTOLIC) 10 MG tablet Take 3 tablets (30 mg total) by mouth daily. 90 tablet 9  . nitroGLYCERIN (NITROSTAT) 0.4 MG SL tablet Place 0.4 mg under the tongue every 5 (five) minutes as needed for chest pain (x 3 doses).    Vladimir Faster Glycol-Propyl Glycol (SYSTANE) 0.4-0.3 % GEL Place 1 drop into both eyes daily as needed (dry eyes).     . Potassium 99 MG TABS Take 99 mg by mouth daily.     . promethazine (PHENERGAN) 25 MG tablet Take 1 tablet (25 mg total) by mouth every 8 (eight) hours as needed for nausea or vomiting. 20 tablet 0  . ranitidine (ZANTAC) 300 MG tablet TAKE 1 TABLET(300 MG) BY MOUTH AT BEDTIME 90 tablet 1  . isosorbide mononitrate (IMDUR) 30 MG 24 hr tablet Take 1 tablet (30 mg total) by mouth daily. 90 tablet 3   No current facility-administered medications on file prior to visit.     Past Medical History:  Diagnosis Date  . Abnormal CT of the chest 2008   last CT4-l 2009:  . No f/u suggested   . Allergy   . Asthma   . CAD (coronary artery disease)    a. Coronary CTA 10/16: Coronary Ca score 211, mod non-obstructive CAD with LM mild plaque (25-50%), mid LAD 50-69%. b. Neg nuc 06/2015.  . Cataract    BILATERAL-REMOVED  . Chronic  diastolic CHF (congestive heart failure) ([place])   . Collagen vascular disease ([place])    "arterial sclerosis" per pt  . Complication of anesthesia    trouble waking up  . Fibromyalgia   . GERD (gastroesophageal reflux disease)   . H/O hiatal hernia   . Heart murmur   . Hyperlipidemia   . Hypertension   . Malignant neoplasm of ascending colon ([place]) 2016   Minimally invasive right hemicolectomy to be done   . Neuromuscular disorder ([place])  FIBROMYALGIA  . Ocular migraine   . OSA (obstructive sleep apnea) 09/2007   dx w/ a sleep study, not on  CPAP  . Osteoarthritis   . Osteoporosis   . Pneumonia    "double" in 2004  . PONV (postoperative nausea and vomiting)   . Reactive airway disease 01/29/2002   dx of pseudoasthma / vcd in 2005 and nl sprirometry History of dyspnea, 2011,  improved after several medications were changed around Question of COPD, disproved July 06, 2009 with nl pft's      . Rheumatoid factor positive   . Shingles 11/2009  . Sleep apnea   . Stroke ([place])   . TIA (transient ischemic attack)    x2 - on Plavix for this  . Torn rotator cuff    right worse than left, both are torn  . Tumor, 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  . Type II diabetes mellitus ([place])   . 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 Vaginal dysplasia     Past Surgical History:  Procedure Laterality Date  . ABDOMINAL HYSTERECTOMY  1980   NO oophorectomy per pt   . ANTERIOR CERVICAL DECOMP/DISCECTOMY FUSION  2001   C 3, C4 and C5 plate and screws  . BREAST BIOPSY Right 1999  . BUNIONECTOMY Left ~ 1977  . CATARACT EXTRACTION W/ INTRAOCULAR LENS  IMPLANT, BILATERAL  2012  . COLON SURGERY  10/2014  . EYE SURGERY Bilateral    torq lens for cataracts  . LEFT HEART CATH AND CORONARY ANGIOGRAPHY N/A 03/22/2016   Procedure: Left Heart Cath and Coronary Angiography;  Surgeon: Belva Crome, MD;  Location: [facility];  Service: Cardiovascular;  Laterality: N/A;  . THYROIDECTOMY, PARTIAL  1960's    . VAGINAL MASS EXCISION  1994   "Laser surgery for vaginal cancer; followed by chemotherapy" (06/27/2012)    Social History   Socioeconomic History  . Marital status: Married    Spouse name: Ilona Sorrel  . Number of children: 2  . Years of education: masters  . Highest education level: None  Social Needs  . Financial resource strain: None  . Food insecurity - worry: None  . Food insecurity - inability: None  . Transportation needs - medical: None  . Transportation needs - non-medical: None  Occupational History  . Occupation: Retired, disable since 2000    Employer: RETIRED  Tobacco Use  . Smoking status: Former Smoker    Packs/day: 0.25    Years: 5.00    Pack years: 1.25    Types: Cigarettes    Last attempt to quit: 01/29/1998    Years since quitting: 19.1  . Smokeless tobacco: Never Used  . Tobacco comment: Quit in 2001  Substance and Sexual Activity  . Alcohol use: No    Alcohol/week: 0.0 oz  . Drug use: No  . Sexual activity: No  Other Topics Concern  . None  Social History Narrative   On disability since 2000--- also husband has MS   Education. College.   Right handed.    Family History  Problem Relation Age of Onset  . Heart disease Father   . Heart disease Mother   . Lung cancer Mother   . Allergies Sister   . Parkinsonism Sister        possible  . Asthma Sister   . Asthma Paternal Grandmother   . Stroke Paternal Grandmother   . Heart disease Unknown        paternal  grandparents, maternal grandparents,   . Heart disease Brother   . Emphysema Brother   . Aneurysm Brother        x3  . Kidney failure Brother   . Diabetes Brother   . Diabetes Brother   . Stroke Brother   . Stroke Maternal Grandmother   . Breast cancer Neg Hx   . Colon cancer Neg Hx   . Heart attack Neg Hx     Review of Systems  Constitutional: Negative for chills and fever.  Musculoskeletal: Positive for arthralgias, gait problem, joint swelling, neck pain and neck stiffness.   Skin: Negative for wound.  Neurological: Positive for light-headedness (occ) and headaches.       Objective:   Vitals:   03/14/17 1021  BP: (!) 142/92  Pulse: 74  Resp: 16  Temp: 98.2 F (36.8 C)  SpO2: 97%   Wt Readings from Last 3 Encounters:  03/14/17 200 lb (90.7 kg)  02/07/17 197 lb (89.4 kg)  01/14/17 197 lb (89.4 kg)   Body mass index is 36 kg/m.   Physical Exam  Constitutional: She is oriented to person, place, and time. She appears well-developed and well-nourished. No distress.  HENT:  Head: Normocephalic and atraumatic.  Musculoskeletal: She exhibits no edema.  Tenderness in right shoulder and upper arm.  Tenderness on lateral aspect of right ankle w/o swelling or bruising  Neurological: She is alert and oriented to person, place, and time.  Skin: Skin is warm and dry. She is not diaphoretic. No erythema.  No bruising           Assessment & Plan:    See Problem List for Assessment and Plan of chronic medical problems.

## 2017-03-14 ENCOUNTER — Ambulatory Visit (INDEPENDENT_AMBULATORY_CARE_PROVIDER_SITE_OTHER): Payer: Medicare Other | Admitting: Internal Medicine

## 2017-03-14 ENCOUNTER — Encounter: Payer: Self-pay | Admitting: Internal Medicine

## 2017-03-14 VITALS — BP 142/92 | HR 74 | Temp 98.2°F | Resp 16 | Wt 200.0 lb

## 2017-03-14 DIAGNOSIS — R2689 Other abnormalities of gait and mobility: Secondary | ICD-10-CM | POA: Diagnosis not present

## 2017-03-14 DIAGNOSIS — M25511 Pain in right shoulder: Secondary | ICD-10-CM | POA: Diagnosis not present

## 2017-03-14 DIAGNOSIS — S93401A Sprain of unspecified ligament of right ankle, initial encounter: Secondary | ICD-10-CM

## 2017-03-14 NOTE — Assessment & Plan Note (Signed)
Tenderness on lateral aspect of right ankle No swelling or bruising Refer to PT

## 2017-03-14 NOTE — Assessment & Plan Note (Signed)
Acute on chronic shoulder pain with dec ROM Refer to ortho

## 2017-03-14 NOTE — Assessment & Plan Note (Signed)
Balance is poor and she is falling more Refer to PT She will follow up with neuro  -- she is slightly concerned she may have had a TIA a little while ago  - stressed the importance of calling neuro today to follow up

## 2017-03-14 NOTE — Patient Instructions (Addendum)
  Medications reviewed and updated.  No changes recommended at this time.   Continue using the ankle brace.  Take tylenol as needed.  Ice the joints as needed.    A referral was ordered for orthopedics and PT

## 2017-03-15 ENCOUNTER — Telehealth: Payer: Self-pay | Admitting: Emergency Medicine

## 2017-03-15 NOTE — Telephone Encounter (Signed)
Referral cancelled. 

## 2017-03-15 NOTE — Telephone Encounter (Signed)
Can you cancel referral please.

## 2017-03-15 NOTE — Telephone Encounter (Signed)
Copied from [place] [phone] [phone]. Topic: Referral - Question >> Mar 15, 2017  3:05 PM Vernona Rieger wrote: Pt said that she saw DR Quay Burow yesterday & she said they talked about a referral going to [facility] to Dr Lorin Mercy, she said please do not send that bc she does not need to see another dr.

## 2017-03-20 ENCOUNTER — Ambulatory Visit (INDEPENDENT_AMBULATORY_CARE_PROVIDER_SITE_OTHER): Payer: Medicare Other | Admitting: Surgery

## 2017-03-20 ENCOUNTER — Ambulatory Visit (INDEPENDENT_AMBULATORY_CARE_PROVIDER_SITE_OTHER): Payer: Medicare Other

## 2017-03-20 VITALS — Ht 62.5 in | Wt 200.0 lb

## 2017-03-20 DIAGNOSIS — M25511 Pain in right shoulder: Secondary | ICD-10-CM

## 2017-03-20 DIAGNOSIS — G43109 Migraine with aura, not intractable, without status migrainosus: Secondary | ICD-10-CM | POA: Diagnosis not present

## 2017-03-20 DIAGNOSIS — S46211A Strain of muscle, fascia and tendon of other parts of biceps, right arm, initial encounter: Secondary | ICD-10-CM | POA: Diagnosis not present

## 2017-03-20 DIAGNOSIS — H43812 Vitreous degeneration, left eye: Secondary | ICD-10-CM | POA: Diagnosis not present

## 2017-03-20 DIAGNOSIS — E119 Type 2 diabetes mellitus without complications: Secondary | ICD-10-CM | POA: Diagnosis not present

## 2017-03-20 DIAGNOSIS — H43811 Vitreous degeneration, right eye: Secondary | ICD-10-CM | POA: Diagnosis not present

## 2017-03-20 NOTE — Progress Notes (Signed)
Office Visit Note   Patient: Samantha [name]           Date of Birth: February 07, 1940           MRN: [id number] Visit Date: 03/20/2017              Requested by: [name], MD [place], [place] [id number] PCP: [name], MD   Assessment & Plan: Visit Diagnoses:  1. Acute pain of right shoulder   2. Biceps tendon rupture, proximal, right, initial encounter     Plan: With patient's ongoing pain and mechanism of injury will go ahead and schedule right shoulder MRI to rule out rotator cuff tear and long head biceps tendon rupture.  Follow-up with Dr. Marlou Sa after to discuss results and further treatment options.  Follow-Up Instructions: Return in about 2 weeks (around 04/03/2017) for with dr dean to review right shoulder MRI.   Orders:  Orders Placed This Encounter  Procedures  . XR Shoulder Right  . MR SHOULDER RIGHT WO CONTRAST   No orders of the defined types were placed in this encounter.     Procedures: No procedures performed   Clinical Data: No additional findings.   Subjective: Chief Complaint  Patient presents with  . Right Shoulder - Injury, Pain    HPI 77 year old female comes in to the office today with complaints of right shoulder pain.  Patient states that a week ago she fell landing directly onto her right side.  Increased pain in the right shoulder with overhead activity and reaching on her back.  States that she has had some decrease in range of motion.  Pain with laying on her right side.  No cervical spine radicular component.  States that she has seen Dr. Shellia Carwin in the past about 13-14 years ago and was told she had a rotator cuff tear. Review of Systems No current cardiac pulmonary GI GU issues  Objective: Vital Signs: Ht 5' 2.5" (1.588 m)   Wt 200 lb (90.7 kg)   BMI 36.00 kg/m   Physical Exam  Constitutional: She is oriented to person, place, and time. No distress.  HENT:  Head: Normocephalic.  Eyes: Pupils are equal,  round, and reactive to light.  Neck: Normal range of motion.  Pulmonary/Chest: No respiratory distress.  Musculoskeletal:  Patient has decreased right shoulder range of motion.  Passively I can get her to about 150 degrees flexion/abduction.  Positive impingement test.  Question long head biceps tendon rupture.  Neurovascular intact.  Neurological: She is alert and oriented to person, place, and time.  Skin: Skin is dry.  Psychiatric: She has a normal mood and affect.    Ortho Exam  Specialty Comments:  No specialty comments available.  Imaging: No results found.   PMFS History: Patient Active Problem List   Diagnosis Date Noted  . Poor balance 03/14/2017  . Sprain of right ankle 03/14/2017  . Right shoulder pain 03/14/2017  . Hair loss 10/19/2016  . Difficulty urinating 04/18/2016  . Angina pectoris ([place]) 03/22/2016  . Spondylosis of lumbar region without myelopathy or radiculopathy 02/02/2016  . CAD in native artery 08/22/2015  . Hypertensive heart disease 07/07/2015  . Essential hypertension 07/07/2015  . SOB (shortness of breath) 07/07/2015  . Dizziness 07/07/2015  . Plantar fasciitis, left 03/22/2015  . Hx of colon cancer, stage I 01/11/2015  . Coronary artery disease due to lipid rich plaque 01/05/2015  . Chronic diastolic CHF (congestive heart failure), NYHA class  2 ([place]) 01/05/2015  . 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 Migraine (Ocular) 01/05/2014  . Gait difficulty 10/06/2013  . Pain in joint, shoulder region 11/27/2012  . VBI (vertebrobasilar insufficiency) 08/22/2012  . Right knee pain 07/29/2012  . TIA (transient ischemic attack) 06/27/2012  . Edema 02/01/2012  . DJD (degenerative joint disease) 02/02/2011  . 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 Postherpetic neuralgia ? 01/02/2010  . NECK PAIN 10/05/2009  . Palpitations 11/23/2008  . UTI'S, RECURRENT 09/28/2008  . Fibromyalgia 08/15/2007    . 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 Hyperlipidemia 05/21/2006  . Reactive airway disease 01/29/2002   Past Medical History:  Diagnosis Date  . Abnormal CT of the chest 2008   last CT4-l 2009:  . No f/u suggested   . Allergy   . Asthma   . CAD (coronary artery disease)    a. Coronary CTA 10/16: Coronary Ca score 211, mod non-obstructive CAD with LM mild plaque (25-50%), mid LAD 50-69%. b. Neg nuc 06/2015.  . Cataract    BILATERAL-REMOVED  . Chronic diastolic CHF (congestive heart failure) ([place])   . Collagen vascular disease ([place])    "arterial sclerosis" per pt  . Complication of anesthesia    trouble waking up  . Fibromyalgia   . GERD (gastroesophageal reflux disease)   . H/O hiatal hernia   . Heart murmur   . Hyperlipidemia   . Hypertension   . Malignant neoplasm of ascending colon ([place]) 2016   Minimally invasive right hemicolectomy to be done   . Neuromuscular disorder ([facility])    FIBROMYALGIA  . Ocular migraine   . OSA (obstructive sleep apnea) 09/2007   dx w/ a sleep study, not on  CPAP  . Osteoarthritis   . Osteoporosis   . Pneumonia    "double" in 2004  . PONV (postoperative nausea and vomiting)   . Reactive airway disease 01/29/2002   dx of pseudoasthma / vcd in 2005 and nl sprirometry History of dyspnea, 2011,  improved after several medications were changed around Question of COPD, disproved July 06, 2009 with nl pft's      . Rheumatoid factor positive   . Shingles 11/2009  . Sleep apnea   . Stroke ([place])   . TIA (transient ischemic attack)    x2 - on Plavix for this  . Torn rotator cuff    right worse than left, both are torn  . Tumor, 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  . Type II diabetes mellitus ([place])   . 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 Vaginal dysplasia     Family History  Problem Relation Age of Onset  . Heart disease Father   . Heart disease Mother   . Lung cancer Mother   . Allergies Sister   . Parkinsonism  Sister        possible  . Asthma Sister   . Asthma Paternal Grandmother   . Stroke Paternal Grandmother   . Heart disease Unknown        paternal grandparents, maternal grandparents,   . Heart disease Brother   . Emphysema Brother   . Aneurysm Brother        x3  . Kidney failure Brother   . Diabetes Brother   . Diabetes Brother   . Stroke Brother   . Stroke Maternal Grandmother   . Breast cancer Neg Hx   .  Colon cancer Neg Hx   . Heart attack Neg Hx     Past Surgical History:  Procedure Laterality Date  . ABDOMINAL HYSTERECTOMY  1980   NO oophorectomy per pt   . ANTERIOR CERVICAL DECOMP/DISCECTOMY FUSION  2001   C 3, C4 and C5 plate and screws  . BREAST BIOPSY Right 1999  . BUNIONECTOMY Left ~ 1977  . CATARACT EXTRACTION W/ INTRAOCULAR LENS  IMPLANT, BILATERAL  2012  . COLON SURGERY  10/2014  . EYE SURGERY Bilateral    torq lens for cataracts  . LEFT HEART CATH AND CORONARY ANGIOGRAPHY N/A 03/22/2016   Procedure: Left Heart Cath and Coronary Angiography;  Surgeon: Belva Crome, MD;  Location: [facility];  Service: Cardiovascular;  Laterality: N/A;  . THYROIDECTOMY, PARTIAL  1960's  . VAGINAL MASS EXCISION  1994   "Laser surgery for vaginal cancer; followed by chemotherapy" (06/27/2012)   Social History   Occupational History  . Occupation: Retired, disable since 2000    Employer: RETIRED  Tobacco Use  . Smoking status: Former Smoker    Packs/day: 0.25    Years: 5.00    Pack years: 1.25    Types: Cigarettes    Last attempt to quit: 01/29/1998    Years since quitting: 19.1  . Smokeless tobacco: Never Used  . Tobacco comment: Quit in 2001  Substance and Sexual Activity  . Alcohol use: No    Alcohol/week: 0.0 oz  . Drug use: No  . Sexual activity: No

## 2017-03-21 ENCOUNTER — Telehealth: Payer: Self-pay | Admitting: Adult Health

## 2017-03-21 NOTE — Telephone Encounter (Signed)
Spoke to patient - states her mouth has been twisted for a long time and imbalance has also been an ongoing issue for her.  States she fell due to this imbalance.  Says she has been evaluated by Dr. Quay Burow and he did not feel her symptoms indicated a TIA.  Additionally, he has ordered PT for her balance problems.  She going to try PT first and follow up with Dr. Quay Burow.  If no improvement, she would like to see Dr. Krista Blue.  We left her appt scheduled for 05/28/17.  She will call to cancel if she feels it is no longer needed.  She appreciated the return call.

## 2017-03-21 NOTE — Telephone Encounter (Signed)
Pt calling stating she has recently fallen and has hurt her right shoulder and her ankle. Pt is having difficulty lifting right foot as she should.  Pt was asked if she has or will go to ED she said no, she has seen PCP and was told PCP did not think she had TIA.  Pt says she felt like she did because her mouth is a little twisted(then stated it could have been like that for some time).  Pt agreed to 1st available appointment with Dr Krista Blue and is on wait list but due to concern about possible TIA she would like to try and be seen earlier than the month of April, please call

## 2017-03-22 ENCOUNTER — Other Ambulatory Visit: Payer: Self-pay | Admitting: Cardiology

## 2017-03-31 ENCOUNTER — Ambulatory Visit
Admission: RE | Admit: 2017-03-31 | Discharge: 2017-03-31 | Disposition: A | Discharge status: Home / Self Care | Payer: Medicare Other | Source: Ambulatory Visit | Attending: Surgery | Admitting: Surgery

## 2017-03-31 DIAGNOSIS — S46011A Strain of muscle(s) and tendon(s) of the rotator cuff of right shoulder, initial encounter: Secondary | ICD-10-CM | POA: Diagnosis not present

## 2017-03-31 DIAGNOSIS — M25511 Pain in right shoulder: Secondary | ICD-10-CM

## 2017-04-10 ENCOUNTER — Encounter (INDEPENDENT_AMBULATORY_CARE_PROVIDER_SITE_OTHER): Payer: Self-pay | Admitting: Orthopedic Surgery

## 2017-04-10 ENCOUNTER — Ambulatory Visit (INDEPENDENT_AMBULATORY_CARE_PROVIDER_SITE_OTHER): Payer: Medicare Other | Admitting: Orthopedic Surgery

## 2017-04-10 DIAGNOSIS — M75121 Complete rotator cuff tear or rupture of right shoulder, not specified as traumatic: Secondary | ICD-10-CM | POA: Diagnosis not present

## 2017-04-10 NOTE — Progress Notes (Signed)
Office Visit Note   Patient: Samantha [name]           Date of Birth: January 02, 1941           MRN: [id number] Visit Date: 04/10/2017 Requested by: [name], MD [place], [place] [address] PCP: [name], MD  Subjective: Chief Complaint  Patient presents with  . Right Shoulder - Follow-up    HPI: Kemiah is a 77 year old patient with right shoulder pain.  Since she was last seen by Benjiman Core she has had an MRI scan.  That scan is reviewed with her today.  She does have a near full-thickness tear of the leading edge of the supraspinatus.  There is about a centimeter of retraction.  Biceps tendon is torn.  Upper fibers of the subscap also involved.  In general she is doing some better but she does report some symptoms with the bulge in her arm.  She is taking over-the-counter medication.              ROS: All systems reviewed are negative as they relate to the chief complaint within the history of present illness.  Patient denies  fevers or chills.   Assessment & Plan: Visit Diagnoses:  1. Complete tear of right rotator cuff     Plan: Impression is right shoulder small rotator cuff tear with torn biceps tendon.  I think it 4 weeks out it would be very difficult to locate that tendon.  I would favor a course of therapy and return office visit in 8 weeks.  Could consider injection in the subacromial space at that time.  Do not want her to get a frozen shoulder.  Follow-up in 8 weeks.  Follow-Up Instructions: Return in about 8 weeks (around 06/05/2017).   Orders:  No orders of the defined types were placed in this encounter.  No orders of the defined types were placed in this encounter.     Procedures: No procedures performed   Clinical Data: No additional findings.  Objective: Vital Signs: There were no vitals taken for this visit.  Physical Exam:   Constitutional: Patient appears well-developed HEENT:  Head: Normocephalic Eyes:EOM are  normal Neck: Normal range of motion Cardiovascular: Normal rate Pulmonary/chest: Effort normal Neurologic: Patient is alert Skin: Skin is warm Psychiatric: Patient has normal mood and affect    Ortho Exam: Orthopedic exam demonstrates some pain with range of motion of that right arm.  Shoulder does not appear to be frozen yet.  Deltoid fires.  Not much in the way of coarse grinding or crepitus with passive range of motion of the shoulder.  Motor sensory function to the hand is intact.  Distal biceps bulge consistent with proximal biceps tendon tear is present.  No bruising or ecchymosis is present.  Specialty Comments:  No specialty comments available.  Imaging: No results found.   PMFS History: Patient Active Problem List   Diagnosis Date Noted  . Poor balance 03/14/2017  . Sprain of right ankle 03/14/2017  . Right shoulder pain 03/14/2017  . Hair loss 10/19/2016  . Difficulty urinating 04/18/2016  . Angina pectoris ([place]) 03/22/2016  . Spondylosis of lumbar region without myelopathy or radiculopathy 02/02/2016  . CAD in native artery 08/22/2015  . Hypertensive heart disease 07/07/2015  . Essential hypertension 07/07/2015  . SOB (shortness of breath) 07/07/2015  . Dizziness 07/07/2015  . Plantar fasciitis, left 03/22/2015  . Hx of colon cancer, stage I 01/11/2015  . Coronary artery  disease due to lipid rich plaque 01/05/2015  . Chronic diastolic CHF (congestive heart failure), NYHA class 2 ([place]) 01/05/2015  . 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 Migraine (Ocular) 01/05/2014  . Gait difficulty 10/06/2013  . Pain in joint, shoulder region 11/27/2012  . VBI (vertebrobasilar insufficiency) 08/22/2012  . Right knee pain 07/29/2012  . TIA (transient ischemic attack) 06/27/2012  . Edema 02/01/2012  . DJD (degenerative joint disease) 02/02/2011  . 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 Postherpetic neuralgia ?  01/02/2010  . NECK PAIN 10/05/2009  . Palpitations 11/23/2008  . UTI'S, RECURRENT 09/28/2008  . Fibromyalgia 08/15/2007  . 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 Hyperlipidemia 05/21/2006  . Reactive airway disease 01/29/2002   Past Medical History:  Diagnosis Date  . Abnormal CT of the chest 2008   last CT4-l 2009:  . No f/u suggested   . Allergy   . Asthma   . CAD (coronary artery disease)    a. Coronary CTA 10/16: Coronary Ca score 211, mod non-obstructive CAD with LM mild plaque (25-50%), mid LAD 50-69%. b. Neg nuc 06/2015.  . Cataract    BILATERAL-REMOVED  . Chronic diastolic CHF (congestive heart failure) ([place])   . Collagen vascular disease ([place])    "arterial sclerosis" per pt  . Complication of anesthesia    trouble waking up  . Fibromyalgia   . GERD (gastroesophageal reflux disease)   . H/O hiatal hernia   . Heart murmur   . Hyperlipidemia   . Hypertension   . Malignant neoplasm of ascending colon ([place]) 2016   Minimally invasive right hemicolectomy to be done   . Neuromuscular disorder (H[facility])    FIBROMYALGIA  . Ocular migraine   . OSA (obstructive sleep apnea) 09/2007   dx w/ a sleep study, not on  CPAP  . Osteoarthritis   . Osteoporosis   . Pneumonia    "double" in 2004  . PONV (postoperative nausea and vomiting)   . Reactive airway disease 01/29/2002   dx of pseudoasthma / vcd in 2005 and nl sprirometry History of dyspnea, 2011,  improved after several medications were changed around Question of COPD, disproved July 06, 2009 with nl pft's      . Rheumatoid factor positive   . Shingles 11/2009  . Sleep apnea   . Stroke ([employer])   . TIA (transient ischemic attack)    x2 - on Plavix for this  . Torn rotator cuff    right worse than left, both are torn  . Tumor, 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  . Type II diabetes mellitus ([place])   . 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 Vaginal dysplasia     Family History  Problem Relation Age of  Onset  . Heart disease Father   . Heart disease Mother   . Lung cancer Mother   . Allergies Sister   . Parkinsonism Sister        possible  . Asthma Sister   . Asthma Paternal Grandmother   . Stroke Paternal Grandmother   . Heart disease Unknown        paternal grandparents, maternal grandparents,   . Heart disease Brother   . Emphysema Brother   . Aneurysm Brother        x3  . Kidney failure Brother   . Diabetes Brother   . Diabetes Brother   . Stroke  Brother   . Stroke Maternal Grandmother   . Breast cancer Neg Hx   . Colon cancer Neg Hx   . Heart attack Neg Hx     Past Surgical History:  Procedure Laterality Date  . ABDOMINAL HYSTERECTOMY  1980   NO oophorectomy per pt   . ANTERIOR CERVICAL DECOMP/DISCECTOMY FUSION  2001   C 3, C4 and C5 plate and screws  . BREAST BIOPSY Right 1999  . BUNIONECTOMY Left ~ 1977  . CATARACT EXTRACTION W/ INTRAOCULAR LENS  IMPLANT, BILATERAL  2012  . COLON SURGERY  10/2014  . EYE SURGERY Bilateral    torq lens for cataracts  . LEFT HEART CATH AND CORONARY ANGIOGRAPHY N/A 03/22/2016   Procedure: Left Heart Cath and Coronary Angiography;  Surgeon: Belva Crome, MD;  Location: [facility];  Service: Cardiovascular;  Laterality: N/A;  . THYROIDECTOMY, PARTIAL  1960's  . VAGINAL MASS EXCISION  1994   "Laser surgery for vaginal cancer; followed by chemotherapy" (06/27/2012)   Social History   Occupational History  . Occupation: Retired, disable since 2000    Employer: RETIRED  Tobacco Use  . Smoking status: Former Smoker    Packs/day: 0.25    Years: 5.00    Pack years: 1.25    Types: Cigarettes    Last attempt to quit: 01/29/1998    Years since quitting: 19.2  . Smokeless tobacco: Never Used  . Tobacco comment: Quit in 2001  Substance and Sexual Activity  . Alcohol use: No    Alcohol/week: 0.0 oz  . Drug use: No  . Sexual activity: No

## 2017-04-18 DIAGNOSIS — M25519 Pain in unspecified shoulder: Secondary | ICD-10-CM | POA: Diagnosis not present

## 2017-04-18 DIAGNOSIS — M1712 Unilateral primary osteoarthritis, left knee: Secondary | ICD-10-CM | POA: Diagnosis not present

## 2017-04-18 DIAGNOSIS — M1711 Unilateral primary osteoarthritis, right knee: Secondary | ICD-10-CM | POA: Diagnosis not present

## 2017-04-18 DIAGNOSIS — M75111 Incomplete rotator cuff tear or rupture of right shoulder, not specified as traumatic: Secondary | ICD-10-CM | POA: Diagnosis not present

## 2017-04-22 NOTE — Patient Instructions (Addendum)
  Test(s) ordered today. Your results will be released to MyChart (or called to you) after review, usually within 72hours after test completion. If any changes need to be made, you will be notified at that same time.  Medications reviewed and updated.  No changes recommended at this time.    Please followup in 6 months   

## 2017-04-22 NOTE — Progress Notes (Signed)
Subjective:    Patient ID: Samantha [name], female    DOB: Aug 11, 1940, 77 y.o.   MRN: [id number]  HPI The patient is here for follow up.  CAD, h/o TIA, chronic diastolic CHF, Hypertension: She is taking her medication daily. She is compliant with a low sodium diet.  She denies chest pain, palpitations, edema, shortness of breath. She is exercising regularly - yoga and PT.     Hyperlipidemia: She is taking her medication daily. She is compliant with a low fat/cholesterol diet. She is exercising regularly - yoga and PT. She denies myalgias.   Diabetes with neuropathy: She is taking her medication daily as prescribed. She is compliant with a diabetic diet. She is exercising regularly. She monitors her sugars and they have been running higher due to steroid injection, but starting to improve.   Right shoulder pain:  She is doing PT and is feeling better.  It is also helping her left shoulder which hurts.    Left knee pain;  She has fluid in her left knee for the past two days.   The knee is sore.  She thinks the fluid in the soreness is from her fall that she had.  She has not seen orthopedics about this, but can if it does not improve.  She has chronic right knee pain.  The physical therapist is also working on improving her leg strength.  Fibromyalgia:  She is doing PT and that is helping.  She is taking lyrica.  Overall her muscle pain has improved.  It was so bad at one point she was concerned that the new statin was causing myositis.  She still is slightly concerned about that, but her pain has improved.  Medications and allergies reviewed with patient and updated if appropriate.  Patient Active Problem List   Diagnosis Date Noted  . Poor balance 03/14/2017  . Sprain of right ankle 03/14/2017  . Right shoulder pain 03/14/2017  . Hair loss 10/19/2016  . Difficulty urinating 04/18/2016  . Angina pectoris ([place]) 03/22/2016  . Spondylosis of lumbar region without myelopathy or  radiculopathy 02/02/2016  . CAD in native artery 08/22/2015  . Hypertensive heart disease 07/07/2015  . Essential hypertension 07/07/2015  . SOB (shortness of breath) 07/07/2015  . Dizziness 07/07/2015  . Plantar fasciitis, left 03/22/2015  . Hx of colon cancer, stage I 01/11/2015  . Coronary artery disease due to lipid rich plaque 01/05/2015  . Chronic diastolic CHF (congestive heart failure), NYHA class 2 ([place]) 01/05/2015  . 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 Migraine (Ocular) 01/05/2014  . Gait difficulty 10/06/2013  . Pain in joint, shoulder region 11/27/2012  . VBI (vertebrobasilar insufficiency) 08/22/2012  . Right knee pain 07/29/2012  . TIA (transient ischemic attack) 06/27/2012  . Edema 02/01/2012  . DJD (degenerative joint disease) 02/02/2011  . 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 Postherpetic neuralgia ? 01/02/2010  . NECK PAIN 10/05/2009  . Palpitations 11/23/2008  . UTI'S, RECURRENT 09/28/2008  . Fibromyalgia 08/15/2007  . 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 Hyperlipidemia 05/21/2006  . Reactive airway disease 01/29/2002    Current Outpatient Medications on File Prior to Visit  Medication Sig Dispense Refill  . acetaminophen (TYLENOL) 500 MG tablet Take 1,000 mg by mouth every 6 (six) hours as needed for moderate pain.    [name] albuterol (PROVENTIL HFA;VENTOLIN HFA) 108 (90 Base) MCG/ACT inhaler Inhale 2 puffs  into the lungs every 6 (six) hours as needed for wheezing or shortness of breath. 18 g 3  . atorvastatin (LIPITOR) 20 MG tablet TAKE 1 TABLET (20 MG TOTAL) BY MOUTH DAILY. 90 tablet 1  . budesonide-formoterol (SYMBICORT) 80-4.5 MCG/ACT inhaler Inhale 2 puffs into the lungs 2 (two) times daily. 1 Inhaler 3  . butalbital-acetaminophen-caffeine (FIORICET) 50-325-40 MG tablet Take 1 tablet by mouth every 6 (six) hours as needed for headache or migraine. 30 tablet 5  . CALCIUM  PO Take 1 tablet by mouth every morning.     . cetirizine (ZYRTEC) 10 MG tablet Take 1 tablet (10 mg total) by mouth daily. 90 tablet 1  . cholecalciferol (VITAMIN D) 1000 UNITS tablet Take 1,000 Units by mouth every morning.     . cholestyramine (QUESTRAN) 4 g packet Take 4 g by mouth every other day.    . clopidogrel (PLAVIX) 75 MG tablet Take 1 tablet (75 mg total) by mouth daily. 90 tablet 1  . furosemide (LASIX) 20 MG tablet TAKE 1 TABLET(20 MG) BY MOUTH DAILY 90 tablet 2  . hydrocortisone 2.5 % cream Apply topically 2 (two) times daily. 30 g 0  . LYRICA 100 MG capsule TAKE ONE CAPSULE BY MOUTH EVERY DAY AS NEEDED FOR PAIN 30 capsule 2  . metFORMIN (GLUCOPHAGE-XR) 500 MG 24 hr tablet Take 1 tablet (500 mg total) by mouth daily with breakfast. 90 tablet 1  . Multiple Vitamin (MULTIVITAMIN) capsule Take 1 capsule by mouth daily.      . nebivolol (BYSTOLIC) 10 MG tablet Take 3 tablets (30 mg total) by mouth daily. 90 tablet 9  . nitroGLYCERIN (NITROSTAT) 0.4 MG SL tablet Place 0.4 mg under the tongue every 5 (five) minutes as needed for chest pain (x 3 doses).    Vladimir Faster Glycol-Propyl Glycol (SYSTANE) 0.4-0.3 % GEL Place 1 drop into both eyes daily as needed (dry eyes).     . Potassium 99 MG TABS Take 99 mg by mouth daily.     . promethazine (PHENERGAN) 25 MG tablet Take 1 tablet (25 mg total) by mouth every 8 (eight) hours as needed for nausea or vomiting. 20 tablet 0  . ranitidine (ZANTAC) 300 MG tablet TAKE 1 TABLET(300 MG) BY MOUTH AT BEDTIME 90 tablet 1  . isosorbide mononitrate (IMDUR) 30 MG 24 hr tablet Take 1 tablet (30 mg total) by mouth daily. 90 tablet 3  . lisinopril (PRINIVIL,ZESTRIL) 20 MG tablet Take 1 tablet (20 mg total) by mouth daily. 90 tablet 3   No current facility-administered medications on file prior to visit.     Past Medical History:  Diagnosis Date  . Abnormal CT of the chest 2008   last CT4-l 2009:  . No f/u suggested   . Allergy   . Asthma   . CAD  (coronary artery disease)    a. Coronary CTA 10/16: Coronary Ca score 211, mod non-obstructive CAD with LM mild plaque (25-50%), mid LAD 50-69%. b. Neg nuc 06/2015.  . Cataract    BILATERAL-REMOVED  . Chronic diastolic CHF (congestive heart failure) ([place])   . Collagen vascular disease ([place])    "arterial sclerosis" per pt  . Complication of anesthesia    trouble waking up  . Fibromyalgia   . GERD (gastroesophageal reflux disease)   . H/O hiatal hernia   . Heart murmur   . Hyperlipidemia   . Hypertension   . Malignant neoplasm of ascending colon ([place]) 2016   Minimally invasive right hemicolectomy  to be done   . Neuromuscular disorder (HCC)    FIBROMYALGIA  . Ocular migraine   . OSA (obstructive sleep apnea) 09/2007   dx w/ a sleep study, not on  CPAP  . Osteoarthritis   . Osteoporosis   . Pneumonia    "double" in 2004  . PONV (postoperative nausea and vomiting)   . Reactive airway disease 01/29/2002   dx of pseudoasthma / vcd in 2005 and nl sprirometry History of dyspnea, 2011,  improved after several medications were changed around Question of COPD, disproved July 06, 2009 with nl pft's      . Rheumatoid factor positive   . Shingles 11/2009  . Sleep apnea   . Stroke ([place])   . TIA (transient ischemic attack)    x2 - on Plavix for this  . Torn rotator cuff    right worse than left, both are torn  . Tumor, 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  . Type II diabetes mellitus ([place])   . 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 Vaginal dysplasia     Past Surgical History:  Procedure Laterality Date  . ABDOMINAL HYSTERECTOMY  1980   NO oophorectomy per pt   . ANTERIOR CERVICAL DECOMP/DISCECTOMY FUSION  2001   C 3, C4 and C5 plate and screws  . BREAST BIOPSY Right 1999  . BUNIONECTOMY Left ~ 1977  . CATARACT EXTRACTION W/ INTRAOCULAR LENS  IMPLANT, BILATERAL  2012  . COLON SURGERY  10/2014  . EYE SURGERY Bilateral    torq lens for cataracts  . LEFT HEART CATH AND CORONARY  ANGIOGRAPHY N/A 03/22/2016   Procedure: Left Heart Cath and Coronary Angiography;  Surgeon: Belva Crome, MD;  Location: [facility];  Service: Cardiovascular;  Laterality: N/A;  . THYROIDECTOMY, PARTIAL  1960's  . VAGINAL MASS EXCISION  1994   "Laser surgery for vaginal cancer; followed by chemotherapy" (06/27/2012)    Social History   Socioeconomic History  . Marital status: Married    Spouse name: Ilona Sorrel  . Number of children: 2  . Years of education: masters  . Highest education level: Not on file  Occupational History  . Occupation: Retired, disable since 2000    Employer: RETIRED  Social Needs  . Financial resource strain: Not on file  . Food insecurity:    Worry: Not on file    Inability: Not on file  . Transportation needs:    Medical: Not on file    Non-medical: Not on file  Tobacco Use  . Smoking status: Former Smoker    Packs/day: 0.25    Years: 5.00    Pack years: 1.25    Types: Cigarettes    Last attempt to quit: 01/29/1998    Years since quitting: 19.2  . Smokeless tobacco: Never Used  . Tobacco comment: Quit in 2001  Substance and Sexual Activity  . Alcohol use: No    Alcohol/week: 0.0 oz  . Drug use: No  . Sexual activity: Never  Lifestyle  . Physical activity:    Days per week: Not on file    Minutes per session: Not on file  . Stress: Not on file  Relationships  . Social connections:    Talks on phone: Not on file    Gets together: Not on file    Attends religious service: Not on file    Active member of club or organization: Not on file    Attends meetings of clubs or  organizations: Not on file    Relationship status: Not on file  Other Topics Concern  . Not on file  Social History Narrative   On disability since 2000--- also husband has MS   Education. College.   Right handed.    Family History  Problem Relation Age of Onset  . Heart disease Father   . Heart disease Mother   . Lung cancer Mother   . Allergies Sister   .  Parkinsonism Sister        possible  . Asthma Sister   . Asthma Paternal Grandmother   . Stroke Paternal Grandmother   . Heart disease Unknown        paternal grandparents, maternal grandparents,   . Heart disease Brother   . Emphysema Brother   . Aneurysm Brother        x3  . Kidney failure Brother   . Diabetes Brother   . Diabetes Brother   . Stroke Brother   . Stroke Maternal Grandmother   . Breast cancer Neg Hx   . Colon cancer Neg Hx   . Heart attack Neg Hx     Review of Systems  Constitutional: Positive for diaphoresis (mild intermittent sweats). Negative for chills and fever.  Respiratory: Positive for cough (minimal) and wheezing. Negative for shortness of breath.   Cardiovascular: Negative for chest pain, palpitations and leg swelling.  Musculoskeletal: Positive for arthralgias, back pain and myalgias.  Neurological: Positive for light-headedness and headaches.       Objective:   Vitals:   04/23/17 1117  BP: 132/88  Pulse: 67  Resp: 16  Temp: 98.2 F (36.8 C)  SpO2: 97%   BP Readings from Last 3 Encounters:  04/23/17 132/88  03/14/17 (!) 142/92  02/07/17 124/82   Wt Readings from Last 3 Encounters:  04/23/17 202 lb (91.6 kg)  03/20/17 200 lb (90.7 kg)  03/14/17 200 lb (90.7 kg)   Body mass index is 36.36 kg/m.   Physical Exam    Constitutional: Appears well-developed and well-nourished. No distress.  HENT:  Head: Normocephalic and atraumatic.  Neck: Neck supple. No tracheal deviation present. No thyromegaly present.  No cervical lymphadenopathy Cardiovascular: Normal rate, regular rhythm and normal heart sounds.   No murmur heard. No carotid bruit .  No edema Pulmonary/Chest: Effort normal and breath sounds normal. No respiratory distress. No has no wheezes. No rales.  Skin: Skin is warm and dry. Not diaphoretic.  Psychiatric: Normal mood and affect. Behavior is normal.      Assessment & Plan:    See Problem List for Assessment and Plan  of chronic medical problems.

## 2017-04-23 ENCOUNTER — Ambulatory Visit (INDEPENDENT_AMBULATORY_CARE_PROVIDER_SITE_OTHER): Payer: Medicare Other | Admitting: Internal Medicine

## 2017-04-23 ENCOUNTER — Encounter: Payer: Self-pay | Admitting: Internal Medicine

## 2017-04-23 ENCOUNTER — Other Ambulatory Visit (INDEPENDENT_AMBULATORY_CARE_PROVIDER_SITE_OTHER): Payer: Medicare Other

## 2017-04-23 VITALS — BP 132/88 | HR 67 | Temp 98.2°F | Resp 16 | Wt 202.0 lb

## 2017-04-23 DIAGNOSIS — E11311 Type 2 diabetes mellitus with unspecified diabetic retinopathy with macular edema: Secondary | ICD-10-CM

## 2017-04-23 DIAGNOSIS — I10 Essential (primary) hypertension: Secondary | ICD-10-CM

## 2017-04-23 DIAGNOSIS — Z794 Long term (current) use of insulin: Secondary | ICD-10-CM

## 2017-04-23 DIAGNOSIS — M75111 Incomplete rotator cuff tear or rupture of right shoulder, not specified as traumatic: Secondary | ICD-10-CM | POA: Diagnosis not present

## 2017-04-23 DIAGNOSIS — E782 Mixed hyperlipidemia: Secondary | ICD-10-CM

## 2017-04-23 DIAGNOSIS — M797 Fibromyalgia: Secondary | ICD-10-CM

## 2017-04-23 DIAGNOSIS — M1711 Unilateral primary osteoarthritis, right knee: Secondary | ICD-10-CM | POA: Diagnosis not present

## 2017-04-23 DIAGNOSIS — G459 Transient cerebral ischemic attack, unspecified: Secondary | ICD-10-CM

## 2017-04-23 DIAGNOSIS — M25519 Pain in unspecified shoulder: Secondary | ICD-10-CM | POA: Diagnosis not present

## 2017-04-23 DIAGNOSIS — M1712 Unilateral primary osteoarthritis, left knee: Secondary | ICD-10-CM | POA: Diagnosis not present

## 2017-04-23 DIAGNOSIS — I2510 Atherosclerotic heart disease of native coronary artery without angina pectoris: Secondary | ICD-10-CM

## 2017-04-23 LAB — COMPREHENSIVE METABOLIC PANEL
ALT: 17 U/L (ref 0–35)
AST: 15 U/L (ref 0–37)
Albumin: 4.2 g/dL (ref 3.5–5.2)
Alkaline Phosphatase: 54 U/L (ref 39–117)
BUN: 21 mg/dL (ref 6–23)
CHLORIDE: 104 meq/L (ref 96–112)
CO2: 31 mEq/L (ref 19–32)
CREATININE: 0.93 mg/dL (ref 0.40–1.20)
Calcium: 9.8 mg/dL (ref 8.4–10.5)
GFR: 75.59 mL/min (ref >60.00)
Glucose, Bld: 122 mg/dL — ABNORMAL HIGH (ref 70–99)
Potassium: 4 mEq/L (ref 3.5–5.1)
SODIUM: 141 meq/L (ref 135–145)
Total Bilirubin: 0.6 mg/dL (ref 0.2–1.2)
Total Protein: 7.3 g/dL (ref 6.0–8.3)

## 2017-04-23 LAB — CBC WITH DIFFERENTIAL/PLATELET
BASOS PCT: 1 % (ref 0.0–3.0)
Basophils Absolute: 0 10*3/uL (ref 0.0–0.1)
EOS ABS: 0.1 10*3/uL (ref 0.0–0.7)
Eosinophils Relative: 3.4 % (ref 0.0–5.0)
HCT: 37.6 % (ref 36.0–46.0)
HEMOGLOBIN: 12.9 g/dL (ref 12.0–15.0)
Lymphocytes Relative: 43.1 % (ref 12.0–46.0)
Lymphs Abs: 1.8 10*3/uL (ref 0.7–4.0)
MCHC: 34.4 g/dL (ref 30.0–36.0)
MCV: 85.9 fl (ref 78.0–100.0)
MONO ABS: 0.4 10*3/uL (ref 0.1–1.0)
Monocytes Relative: 9.7 % (ref 3.0–12.0)
NEUTROS ABS: 1.7 10*3/uL (ref 1.4–7.7)
Neutrophils Relative %: 42.8 % — ABNORMAL LOW (ref 43.0–77.0)
PLATELETS: 156 10*3/uL (ref 150.0–400.0)
RBC: 4.38 Mil/uL (ref 3.87–5.11)
RDW: 15.1 % (ref 11.5–15.5)
WBC: 4.1 10*3/uL (ref 4.0–10.5)

## 2017-04-23 LAB — LIPID PANEL
CHOL/HDL RATIO: 2
Cholesterol: 169 mg/dL (ref 0–200)
HDL: 77 mg/dL (ref >39.00)
LDL Cholesterol: 76 mg/dL (ref 0–99)
NONHDL: 91.78
TRIGLYCERIDES: 81 mg/dL (ref 0.0–149.0)
VLDL: 16.2 mg/dL (ref 0.0–40.0)

## 2017-04-23 LAB — TSH: TSH: 2.52 u[IU]/mL (ref 0.35–4.50)

## 2017-04-23 LAB — CK: Total CK: 275 U/L — ABNORMAL HIGH (ref 7–177)

## 2017-04-23 LAB — HEMOGLOBIN A1C: Hgb A1c MFr Bld: 6.9 % — ABNORMAL HIGH (ref 4.6–6.5)

## 2017-04-23 NOTE — Assessment & Plan Note (Signed)
Check lipid panel, CMP, TSH Continue statin Continue regular exercise and healthy diet

## 2017-04-23 NOTE — Assessment & Plan Note (Signed)
Has chronic muscle pain It was increased for a while and she was concerned it was related to the statin Will check CK Pain has improved since starting physical therapy Stressed the importance of continuing regular exercise Continue Lyrica

## 2017-04-23 NOTE — Assessment & Plan Note (Signed)
Up-to-date with cardiology visits No recent chest pain or palpitations Continue current medications CBC, CMP, TSH, lipid panel

## 2017-04-23 NOTE — Assessment & Plan Note (Signed)
Sugars were slightly elevated after steroid injection, but are improving Continue metformin Continue Lyrica for neuropathy Check A1c Stressed the importance of continuing regular exercise Follow-up in 6 months

## 2017-04-23 NOTE — Assessment & Plan Note (Signed)
Blood pressure borderline high here today, but okay Continue current medications at current doses CBC, CMP, TSH

## 2017-04-23 NOTE — Assessment & Plan Note (Signed)
-

## 2017-04-25 ENCOUNTER — Encounter: Payer: Self-pay | Admitting: Internal Medicine

## 2017-04-25 DIAGNOSIS — M1712 Unilateral primary osteoarthritis, left knee: Secondary | ICD-10-CM | POA: Diagnosis not present

## 2017-04-25 DIAGNOSIS — M25519 Pain in unspecified shoulder: Secondary | ICD-10-CM | POA: Diagnosis not present

## 2017-04-25 DIAGNOSIS — M75111 Incomplete rotator cuff tear or rupture of right shoulder, not specified as traumatic: Secondary | ICD-10-CM | POA: Diagnosis not present

## 2017-04-25 DIAGNOSIS — M1711 Unilateral primary osteoarthritis, right knee: Secondary | ICD-10-CM | POA: Diagnosis not present

## 2017-04-26 ENCOUNTER — Other Ambulatory Visit: Payer: Self-pay | Admitting: Cardiology

## 2017-04-28 ENCOUNTER — Other Ambulatory Visit: Payer: Self-pay | Admitting: Physician Assistant

## 2017-04-30 DIAGNOSIS — M25519 Pain in unspecified shoulder: Secondary | ICD-10-CM | POA: Diagnosis not present

## 2017-04-30 DIAGNOSIS — M1712 Unilateral primary osteoarthritis, left knee: Secondary | ICD-10-CM | POA: Diagnosis not present

## 2017-04-30 DIAGNOSIS — M1711 Unilateral primary osteoarthritis, right knee: Secondary | ICD-10-CM | POA: Diagnosis not present

## 2017-04-30 DIAGNOSIS — M75111 Incomplete rotator cuff tear or rupture of right shoulder, not specified as traumatic: Secondary | ICD-10-CM | POA: Diagnosis not present

## 2017-05-02 DIAGNOSIS — M1712 Unilateral primary osteoarthritis, left knee: Secondary | ICD-10-CM | POA: Diagnosis not present

## 2017-05-02 DIAGNOSIS — M25519 Pain in unspecified shoulder: Secondary | ICD-10-CM | POA: Diagnosis not present

## 2017-05-02 DIAGNOSIS — M75111 Incomplete rotator cuff tear or rupture of right shoulder, not specified as traumatic: Secondary | ICD-10-CM | POA: Diagnosis not present

## 2017-05-02 DIAGNOSIS — M1711 Unilateral primary osteoarthritis, right knee: Secondary | ICD-10-CM | POA: Diagnosis not present

## 2017-05-06 DIAGNOSIS — M1711 Unilateral primary osteoarthritis, right knee: Secondary | ICD-10-CM | POA: Diagnosis not present

## 2017-05-06 DIAGNOSIS — M75111 Incomplete rotator cuff tear or rupture of right shoulder, not specified as traumatic: Secondary | ICD-10-CM | POA: Diagnosis not present

## 2017-05-06 DIAGNOSIS — M1712 Unilateral primary osteoarthritis, left knee: Secondary | ICD-10-CM | POA: Diagnosis not present

## 2017-05-06 DIAGNOSIS — M25519 Pain in unspecified shoulder: Secondary | ICD-10-CM | POA: Diagnosis not present

## 2017-05-14 DIAGNOSIS — H43812 Vitreous degeneration, left eye: Secondary | ICD-10-CM | POA: Diagnosis not present

## 2017-05-14 DIAGNOSIS — M1712 Unilateral primary osteoarthritis, left knee: Secondary | ICD-10-CM | POA: Diagnosis not present

## 2017-05-14 DIAGNOSIS — H43811 Vitreous degeneration, right eye: Secondary | ICD-10-CM | POA: Diagnosis not present

## 2017-05-14 DIAGNOSIS — M1711 Unilateral primary osteoarthritis, right knee: Secondary | ICD-10-CM | POA: Diagnosis not present

## 2017-05-14 DIAGNOSIS — M75111 Incomplete rotator cuff tear or rupture of right shoulder, not specified as traumatic: Secondary | ICD-10-CM | POA: Diagnosis not present

## 2017-05-14 DIAGNOSIS — M25519 Pain in unspecified shoulder: Secondary | ICD-10-CM | POA: Diagnosis not present

## 2017-05-14 DIAGNOSIS — E119 Type 2 diabetes mellitus without complications: Secondary | ICD-10-CM | POA: Diagnosis not present

## 2017-05-14 DIAGNOSIS — G43109 Migraine with aura, not intractable, without status migrainosus: Secondary | ICD-10-CM | POA: Diagnosis not present

## 2017-05-17 DIAGNOSIS — M25519 Pain in unspecified shoulder: Secondary | ICD-10-CM | POA: Diagnosis not present

## 2017-05-17 DIAGNOSIS — M1712 Unilateral primary osteoarthritis, left knee: Secondary | ICD-10-CM | POA: Diagnosis not present

## 2017-05-17 DIAGNOSIS — M75111 Incomplete rotator cuff tear or rupture of right shoulder, not specified as traumatic: Secondary | ICD-10-CM | POA: Diagnosis not present

## 2017-05-17 DIAGNOSIS — M1711 Unilateral primary osteoarthritis, right knee: Secondary | ICD-10-CM | POA: Diagnosis not present

## 2017-05-21 DIAGNOSIS — M1711 Unilateral primary osteoarthritis, right knee: Secondary | ICD-10-CM | POA: Diagnosis not present

## 2017-05-21 DIAGNOSIS — M75111 Incomplete rotator cuff tear or rupture of right shoulder, not specified as traumatic: Secondary | ICD-10-CM | POA: Diagnosis not present

## 2017-05-21 DIAGNOSIS — M25519 Pain in unspecified shoulder: Secondary | ICD-10-CM | POA: Diagnosis not present

## 2017-05-21 DIAGNOSIS — M1712 Unilateral primary osteoarthritis, left knee: Secondary | ICD-10-CM | POA: Diagnosis not present

## 2017-05-23 ENCOUNTER — Telehealth: Payer: Self-pay | Admitting: Internal Medicine

## 2017-05-23 ENCOUNTER — Other Ambulatory Visit: Payer: Self-pay | Admitting: Internal Medicine

## 2017-05-23 NOTE — Telephone Encounter (Signed)
Copied from [place] [id number]. Topic: Quick Communication - See Telephone Encounter >> May 23, 2017  3:46 PM Percell Belt A wrote: CRM for notification. See Telephone encounter for: 05/23/17. Pt called and said that her ortho dr sent her to [facility] so she is not in need of the PT referral at this time.

## 2017-05-24 NOTE — Telephone Encounter (Signed)
Referral has been cancelled.

## 2017-05-28 ENCOUNTER — Ambulatory Visit: Payer: Medicare Other | Admitting: Neurology

## 2017-05-30 DIAGNOSIS — M1712 Unilateral primary osteoarthritis, left knee: Secondary | ICD-10-CM | POA: Diagnosis not present

## 2017-05-30 DIAGNOSIS — M1711 Unilateral primary osteoarthritis, right knee: Secondary | ICD-10-CM | POA: Diagnosis not present

## 2017-05-30 DIAGNOSIS — M25519 Pain in unspecified shoulder: Secondary | ICD-10-CM | POA: Diagnosis not present

## 2017-05-30 DIAGNOSIS — M75111 Incomplete rotator cuff tear or rupture of right shoulder, not specified as traumatic: Secondary | ICD-10-CM | POA: Diagnosis not present

## 2017-06-03 DIAGNOSIS — M1712 Unilateral primary osteoarthritis, left knee: Secondary | ICD-10-CM | POA: Diagnosis not present

## 2017-06-03 DIAGNOSIS — M25519 Pain in unspecified shoulder: Secondary | ICD-10-CM | POA: Diagnosis not present

## 2017-06-03 DIAGNOSIS — M1711 Unilateral primary osteoarthritis, right knee: Secondary | ICD-10-CM | POA: Diagnosis not present

## 2017-06-03 DIAGNOSIS — M75111 Incomplete rotator cuff tear or rupture of right shoulder, not specified as traumatic: Secondary | ICD-10-CM | POA: Diagnosis not present

## 2017-06-06 DIAGNOSIS — M1712 Unilateral primary osteoarthritis, left knee: Secondary | ICD-10-CM | POA: Diagnosis not present

## 2017-06-06 DIAGNOSIS — M1711 Unilateral primary osteoarthritis, right knee: Secondary | ICD-10-CM | POA: Diagnosis not present

## 2017-06-06 DIAGNOSIS — M75111 Incomplete rotator cuff tear or rupture of right shoulder, not specified as traumatic: Secondary | ICD-10-CM | POA: Diagnosis not present

## 2017-06-06 DIAGNOSIS — M25519 Pain in unspecified shoulder: Secondary | ICD-10-CM | POA: Diagnosis not present

## 2017-06-12 ENCOUNTER — Ambulatory Visit (INDEPENDENT_AMBULATORY_CARE_PROVIDER_SITE_OTHER): Payer: Medicare Other | Admitting: Orthopedic Surgery

## 2017-06-12 ENCOUNTER — Encounter (INDEPENDENT_AMBULATORY_CARE_PROVIDER_SITE_OTHER): Payer: Self-pay | Admitting: Orthopedic Surgery

## 2017-06-12 DIAGNOSIS — M25511 Pain in right shoulder: Secondary | ICD-10-CM

## 2017-06-12 DIAGNOSIS — I2510 Atherosclerotic heart disease of native coronary artery without angina pectoris: Secondary | ICD-10-CM | POA: Diagnosis not present

## 2017-06-12 NOTE — Progress Notes (Signed)
Office Visit Note   Patient: Samantha [name]           Date of Birth: [birth year]           MRN: [id number] Visit Date: 06/12/2017 Requested by: Samantha Rail, MD [place], [place] [address] PCP: Samantha Rail, MD  Subjective: Chief Complaint  Patient presents with  . Right Shoulder - Follow-up    HPI: [name] is a patient with right shoulder pain.  She is doing much better.  Therapy is helping.  She states that her motion has improved a lot.  She does have a history of fibromyalgia.  She does have some degenerative disc disease in her back which gets her trouble walking and standing at times.  She is doing more every day.  She is able to wash her hair.              ROS: All systems reviewed are negative as they relate to the chief complaint within the history of present illness.  Patient denies  fevers or chills.   Assessment & Plan: Visit Diagnoses:  1. Acute pain of right shoulder     Plan: Impression is improvement in right shoulder pain with physical therapy.  No further intervention required at this time.  She is happy with her result.  I will see her back as needed.  Follow-Up Instructions: Return if symptoms worsen or fail to improve.   Orders:  No orders of the defined types were placed in this encounter.  No orders of the defined types were placed in this encounter.     Procedures: No procedures performed   Clinical Data: No additional findings.  Objective: Vital Signs: There were no vitals taken for this visit.  Physical Exam:   Constitutional: Patient appears well-developed HEENT:  Head: Normocephalic Eyes:EOM are normal Neck: Normal range of motion Cardiovascular: Normal rate Pulmonary/chest: Effort normal Neurologic: Patient is alert Skin: Skin is warm Psychiatric: Patient has normal mood and affect    Ortho Exam: Orthopedic exam demonstrates good passive shoulder range of motion which is improved.  Well above 90 degrees of  forward flexion and abduction at this time with pretty reasonable rotator cuff strength on the right infraspinatus supraspinatus and subscap muscle testing.  No discrete AC joint tenderness to direct palpation.  No other masses lymph adenopathy or skin changes noted in the shoulder girdle region.  Specialty Comments:  No specialty comments available.  Imaging: No results found.   PMFS History: Patient Active Problem List   Diagnosis Date Noted  . Poor balance 03/14/2017  . Sprain of right ankle 03/14/2017  . Right shoulder pain 03/14/2017  . Hair loss 10/19/2016  . Difficulty urinating 04/18/2016  . Angina pectoris ([place]) 03/22/2016  . Spondylosis of lumbar region without myelopathy or radiculopathy 02/02/2016  . CAD in native artery 08/22/2015  . Hypertensive heart disease 07/07/2015  . Essential hypertension 07/07/2015  . SOB (shortness of breath) 07/07/2015  . Dizziness 07/07/2015  . Plantar fasciitis, left 03/22/2015  . Hx of colon cancer, stage I 01/11/2015  . Coronary artery disease due to lipid rich plaque 01/05/2015  . Chronic diastolic CHF (congestive heart failure), NYHA class 2 ([place]) 01/05/2015  . 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 Migraine (Ocular) 01/05/2014  . Gait difficulty 10/06/2013  . Pain in joint, shoulder region 11/27/2012  . VBI (vertebrobasilar insufficiency) 08/22/2012  . Right knee pain 07/29/2012  .  TIA (transient ischemic attack) 06/27/2012  . Edema 02/01/2012  . DJD (degenerative joint disease) 02/02/2011  . 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 Postherpetic neuralgia ? 01/02/2010  . NECK PAIN 10/05/2009  . Palpitations 11/23/2008  . UTI'S, RECURRENT 09/28/2008  . Fibromyalgia 08/15/2007  . 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 Hyperlipidemia 05/21/2006  . Reactive airway disease 01/29/2002   Past Medical History:  Diagnosis Date  . Abnormal CT of the  chest 2008   last CT4-l 2009:  . No f/u suggested   . Allergy   . Asthma   . CAD (coronary artery disease)    a. Coronary CTA 10/16: Coronary Ca score 211, mod non-obstructive CAD with LM mild plaque (25-50%), mid LAD 50-69%. b. Neg nuc 06/2015.  . Cataract    BILATERAL-REMOVED  . Chronic diastolic CHF (congestive heart failure) ([place])   . Collagen vascular disease ([place])    "arterial sclerosis" per pt  . Complication of anesthesia    trouble waking up  . Fibromyalgia   . GERD (gastroesophageal reflux disease)   . H/O hiatal hernia   . Heart murmur   . Hyperlipidemia   . Hypertension   . Malignant neoplasm of ascending colon ([place]) 2016   Minimally invasive right hemicolectomy to be done   . Neuromuscular disorder (HCC)    FIBROMYALGIA  . Ocular migraine   . OSA (obstructive sleep apnea) 09/2007   dx w/ a sleep study, not on  CPAP  . Osteoarthritis   . Osteoporosis   . Pneumonia    "double" in 2004  . PONV (postoperative nausea and vomiting)   . Reactive airway disease 01/29/2002   dx of pseudoasthma / vcd in 2005 and nl sprirometry History of dyspnea, 2011,  improved after several medications were changed around Question of COPD, disproved July 06, 2009 with nl pft's      . Rheumatoid factor positive   . Shingles 11/2009  . Sleep apnea   . Stroke ([place])   . TIA (transient ischemic attack)    x2 - on Plavix for this  . Torn rotator cuff    right worse than left, both are torn  . Tumor, 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  . Type II diabetes mellitus ([place])   . 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 Vaginal dysplasia     Family History  Problem Relation Age of Onset  . Heart disease Father   . Heart disease Mother   . Lung cancer Mother   . Allergies Sister   . Parkinsonism Sister        possible  . Asthma Sister   . Asthma Paternal Grandmother   . Stroke Paternal Grandmother   . Heart disease Unknown        paternal grandparents, maternal grandparents,   . Heart  disease Brother   . Emphysema Brother   . Aneurysm Brother        x3  . Kidney failure Brother   . Diabetes Brother   . Diabetes Brother   . Stroke Brother   . Stroke Maternal Grandmother   . Breast cancer Neg Hx   . Colon cancer Neg Hx   . Heart attack Neg Hx     Past Surgical History:  Procedure Laterality Date  . ABDOMINAL HYSTERECTOMY  1980   NO oophorectomy per pt   . ANTERIOR CERVICAL DECOMP/DISCECTOMY FUSION  2001   C 3, C4 and C5 plate  and screws  . BREAST BIOPSY Right 1999  . BUNIONECTOMY Left ~ 1977  . CATARACT EXTRACTION W/ INTRAOCULAR LENS  IMPLANT, BILATERAL  2012  . COLON SURGERY  10/2014  . EYE SURGERY Bilateral    torq lens for cataracts  . LEFT HEART CATH AND CORONARY ANGIOGRAPHY N/A 03/22/2016   Procedure: Left Heart Cath and Coronary Angiography;  Surgeon: Belva Crome, MD;  Location: [facility];  Service: Cardiovascular;  Laterality: N/A;  . THYROIDECTOMY, PARTIAL  1960's  . VAGINAL MASS EXCISION  1994   "Laser surgery for vaginal cancer; followed by chemotherapy" (06/27/2012)   Social History   Occupational History  . Occupation: Retired, disable since 2000    Employer: RETIRED  Tobacco Use  . Smoking status: Former Smoker    Packs/day: 0.25    Years: 5.00    Pack years: 1.25    Types: Cigarettes    Last attempt to quit: 01/29/1998    Years since quitting: 19.3  . Smokeless tobacco: Never Used  . Tobacco comment: Quit in 2001  Substance and Sexual Activity  . Alcohol use: No    Alcohol/week: 0.0 oz  . Drug use: No  . Sexual activity: Never

## 2017-07-02 DIAGNOSIS — Z01419 Encounter for gynecological examination (general) (routine) without abnormal findings: Secondary | ICD-10-CM | POA: Diagnosis not present

## 2017-07-02 DIAGNOSIS — Z1231 Encounter for screening mammogram for malignant neoplasm of breast: Secondary | ICD-10-CM | POA: Diagnosis not present

## 2017-07-02 DIAGNOSIS — Z6835 Body mass index (BMI) 35.0-35.9, adult: Secondary | ICD-10-CM | POA: Diagnosis not present

## 2017-07-06 ENCOUNTER — Other Ambulatory Visit: Payer: Self-pay | Admitting: Internal Medicine

## 2017-07-20 ENCOUNTER — Emergency Department (HOSPITAL_COMMUNITY)
Admission: EM | Admit: 2017-07-20 | Discharge: 2017-07-20 | Disposition: A | Discharge status: Home / Self Care | Payer: Medicare Other | Attending: Emergency Medicine | Admitting: Emergency Medicine

## 2017-07-20 ENCOUNTER — Encounter (HOSPITAL_COMMUNITY): Payer: Self-pay

## 2017-07-20 ENCOUNTER — Emergency Department (HOSPITAL_COMMUNITY): Payer: Medicare Other

## 2017-07-20 ENCOUNTER — Other Ambulatory Visit: Payer: Self-pay

## 2017-07-20 DIAGNOSIS — E119 Type 2 diabetes mellitus without complications: Secondary | ICD-10-CM | POA: Insufficient documentation

## 2017-07-20 DIAGNOSIS — J45909 Unspecified asthma, uncomplicated: Secondary | ICD-10-CM | POA: Diagnosis not present

## 2017-07-20 DIAGNOSIS — Z85038 Personal history of other malignant neoplasm of large intestine: Secondary | ICD-10-CM | POA: Diagnosis not present

## 2017-07-20 DIAGNOSIS — I5032 Chronic diastolic (congestive) heart failure: Secondary | ICD-10-CM | POA: Diagnosis not present

## 2017-07-20 DIAGNOSIS — R197 Diarrhea, unspecified: Secondary | ICD-10-CM | POA: Diagnosis not present

## 2017-07-20 DIAGNOSIS — K625 Hemorrhage of anus and rectum: Secondary | ICD-10-CM

## 2017-07-20 DIAGNOSIS — Z87891 Personal history of nicotine dependence: Secondary | ICD-10-CM | POA: Insufficient documentation

## 2017-07-20 DIAGNOSIS — I110 Hypertensive heart disease with heart failure: Secondary | ICD-10-CM | POA: Insufficient documentation

## 2017-07-20 DIAGNOSIS — R040 Epistaxis: Secondary | ICD-10-CM

## 2017-07-20 DIAGNOSIS — N281 Cyst of kidney, acquired: Secondary | ICD-10-CM | POA: Diagnosis not present

## 2017-07-20 DIAGNOSIS — I2510 Atherosclerotic heart disease of native coronary artery without angina pectoris: Secondary | ICD-10-CM | POA: Diagnosis not present

## 2017-07-20 DIAGNOSIS — R1012 Left upper quadrant pain: Secondary | ICD-10-CM

## 2017-07-20 DIAGNOSIS — Z7902 Long term (current) use of antithrombotics/antiplatelets: Secondary | ICD-10-CM | POA: Diagnosis not present

## 2017-07-20 LAB — CBC
HEMATOCRIT: 39.8 % (ref 36.0–46.0)
Hemoglobin: 13.1 g/dL (ref 12.0–15.0)
MCH: 28.6 pg (ref 26.0–34.0)
MCHC: 32.9 g/dL (ref 30.0–36.0)
MCV: 86.9 fL (ref 78.0–100.0)
PLATELETS: 154 10*3/uL (ref 150–400)
RBC: 4.58 MIL/uL (ref 3.87–5.11)
RDW: 13.1 % (ref 11.5–15.5)
WBC: 2.8 10*3/uL — ABNORMAL LOW (ref 4.0–10.5)

## 2017-07-20 LAB — COMPREHENSIVE METABOLIC PANEL
ALBUMIN: 3.8 g/dL (ref 3.5–5.0)
ALT: 17 U/L (ref 14–54)
AST: 17 U/L (ref 15–41)
Alkaline Phosphatase: 52 U/L (ref 38–126)
Anion gap: 7 (ref 5–15)
BILIRUBIN TOTAL: 0.9 mg/dL (ref 0.3–1.2)
BUN: 10 mg/dL (ref 6–20)
CHLORIDE: 107 mmol/L (ref 101–111)
CO2: 25 mmol/L (ref 22–32)
CREATININE: 0.87 mg/dL (ref 0.44–1.00)
Calcium: 9 mg/dL (ref 8.9–10.3)
GFR calc Af Amer: >60 mL/min (ref >60)
GFR calc non Af Amer: >60 mL/min (ref >60)
GLUCOSE: 108 mg/dL — AB (ref 65–99)
POTASSIUM: 3.7 mmol/L (ref 3.5–5.1)
Sodium: 139 mmol/L (ref 135–145)
Total Protein: 6.8 g/dL (ref 6.5–8.1)

## 2017-07-20 LAB — ABO/RH: ABO/RH(D): O POS

## 2017-07-20 LAB — TYPE AND SCREEN
ABO/RH(D): O POS
Antibody Screen: NEGATIVE

## 2017-07-20 LAB — POC OCCULT BLOOD, ED: Fecal Occult Bld: NEGATIVE

## 2017-07-20 MED ORDER — IOHEXOL 300 MG/ML  SOLN
100.0000 mL | Freq: Once | INTRAMUSCULAR | Status: AC | PRN
  Administered 2017-07-20: 100 mL via INTRAVENOUS

## 2017-07-20 MED ORDER — OXYMETAZOLINE HCL 0.05 % NA SOLN
1.0000 | Freq: Once | NASAL | Status: AC
  Administered 2017-07-20: 1 via NASAL
  Filled 2017-07-20: qty 15

## 2017-07-20 MED ORDER — ONDANSETRON HCL 4 MG/2ML IJ SOLN
4.0000 mg | Freq: Once | INTRAMUSCULAR | Status: AC
  Administered 2017-07-20: 4 mg via INTRAVENOUS
  Filled 2017-07-20: qty 2

## 2017-07-20 NOTE — ED Notes (Signed)
Patient transported to CT 

## 2017-07-20 NOTE — ED Notes (Signed)
Got patient into a gown

## 2017-07-20 NOTE — ED Provider Notes (Signed)
Emergency Department Provider Note   I have reviewed the triage vital signs and the nursing notes.   HISTORY  Chief Complaint Epistaxis   HPI Samantha [name] is a 77 y.o. female with PMH of CAD, CHF, HLD, HTN, DM, and colon cancer s/p partial colon resection resents to the emergency department for evaluation of epistaxis today along worsening left-sided abdominal pain and blood in the bowel movements.  In terms of the epistaxis the patient states she has had intermittent episodes of small-volume epistaxis over the past 2 weeks.  She is been compliant with her Plavix.  She does not take aspirin.  Today she had a much larger volume and persistent nosebleed from the left nostril.  She held pressure and bleeding stopped.  No difficulty breathing.  She does note some nausea and has the taste of blood in her mouth but states symptoms have improved significantly.  Patient also been seeing some bright red blood when wiping after bowel movements.  She has had diarrhea over the past week with left upper quadrant abdominal pain which is more severe than normal.  She does have history of colon resection.  She states she primarily sees blood with wiping and that it is bright red.  Denies any black, tarry stools.    Past Medical History:  Diagnosis Date  . Abnormal CT of the chest 2008   last CT4-l 2009:  . No f/u suggested   . Allergy   . Asthma   . CAD (coronary artery disease)    a. Coronary CTA 10/16: Coronary Ca score 211, mod non-obstructive CAD with LM mild plaque (25-50%), mid LAD 50-69%. b. Neg nuc 06/2015.  . Cataract    BILATERAL-REMOVED  . Chronic diastolic CHF (congestive heart failure) ([place])   . Collagen vascular disease ([place])    "arterial sclerosis" per pt  . Complication of anesthesia    trouble waking up  . Fibromyalgia   . GERD (gastroesophageal reflux disease)   . H/O hiatal hernia   . Heart murmur   . Hyperlipidemia   . Hypertension   . Malignant neoplasm of  ascending colon ([place]) 2016   Minimally invasive right hemicolectomy to be done   . Neuromuscular disorder (HCC)    FIBROMYALGIA  . Ocular migraine   . OSA (obstructive sleep apnea) 09/2007   dx w/ a sleep study, not on  CPAP  . Osteoarthritis   . Osteoporosis   . Pneumonia    "double" in 2004  . PONV (postoperative nausea and vomiting)   . Reactive airway disease 01/29/2002   dx of pseudoasthma / vcd in 2005 and nl sprirometry History of dyspnea, 2011,  improved after several medications were changed around Question of COPD, disproved July 06, 2009 with nl pft's      . Rheumatoid factor positive   . Shingles 11/2009  . Sleep apnea   . Stroke ([place])   . TIA (transient ischemic attack)    x2 - on Plavix for this  . Torn rotator cuff    right worse than left, both are torn  . Tumor, 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  . Type II diabetes mellitus ([place])   . 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 Vaginal dysplasia     Patient Active Problem List   Diagnosis Date Noted  . Poor balance 03/14/2017  . Sprain of right ankle 03/14/2017  . Right shoulder pain 03/14/2017  . Hair loss 10/19/2016  . Difficulty urinating  04/18/2016  . Angina pectoris ([place]) 03/22/2016  . Spondylosis of lumbar region without myelopathy or radiculopathy 02/02/2016  . CAD in native artery 08/22/2015  . Hypertensive heart disease 07/07/2015  . Essential hypertension 07/07/2015  . SOB (shortness of breath) 07/07/2015  . Dizziness 07/07/2015  . Plantar fasciitis, left 03/22/2015  . Hx of colon cancer, stage I 01/11/2015  . Coronary artery disease due to lipid rich plaque 01/05/2015  . Chronic diastolic CHF (congestive heart failure), NYHA class 2 ([place]) 01/05/2015  . 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 Migraine (Ocular) 01/05/2014  . Gait difficulty 10/06/2013  . Pain in joint, shoulder region 11/27/2012  . VBI (vertebrobasilar insufficiency)  08/22/2012  . Right knee pain 07/29/2012  . TIA (transient ischemic attack) 06/27/2012  . Edema 02/01/2012  . DJD (degenerative joint disease) 02/02/2011  . 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 Postherpetic neuralgia ? 01/02/2010  . NECK PAIN 10/05/2009  . Palpitations 11/23/2008  . UTI'S, RECURRENT 09/28/2008  . Fibromyalgia 08/15/2007  . 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 Hyperlipidemia 05/21/2006  . Reactive airway disease 01/29/2002    Past Surgical History:  Procedure Laterality Date  . ABDOMINAL HYSTERECTOMY  1980   NO oophorectomy per pt   . ANTERIOR CERVICAL DECOMP/DISCECTOMY FUSION  2001   C 3, C4 and C5 plate and screws  . BREAST BIOPSY Right 1999  . BUNIONECTOMY Left ~ 1977  . CATARACT EXTRACTION W/ INTRAOCULAR LENS  IMPLANT, BILATERAL  2012  . COLON SURGERY  10/2014  . EYE SURGERY Bilateral    torq lens for cataracts  . LEFT HEART CATH AND CORONARY ANGIOGRAPHY N/A 03/22/2016   Procedure: Left Heart Cath and Coronary Angiography;  Surgeon: Belva Crome, MD;  Location: [facility];  Service: Cardiovascular;  Laterality: N/A;  . THYROIDECTOMY, PARTIAL  1960's  . VAGINAL MASS EXCISION  1994   "Laser surgery for vaginal cancer; followed by chemotherapy" (06/27/2012)    Allergies Bactrim [sulfamethoxazole-trimethoprim]; Cefuroxime axetil; Oxycodone; Pravastatin; Seldane [terfenadine]; Zocor [simvastatin]; Tramadol; Lime flavor [flavoring agent]; and Tape  Family History  Problem Relation Age of Onset  . Heart disease Father   . Heart disease Mother   . Lung cancer Mother   . Allergies Sister   . Parkinsonism Sister        possible  . Asthma Sister   . Asthma Paternal Grandmother   . Stroke Paternal Grandmother   . Heart disease Unknown        paternal grandparents, maternal grandparents,   . Heart disease Brother   . Emphysema Brother   . Aneurysm Brother        x3  . Kidney failure Brother   . Diabetes Brother   .  Diabetes Brother   . Stroke Brother   . Stroke Maternal Grandmother   . Breast cancer Neg Hx   . Colon cancer Neg Hx   . Heart attack Neg Hx     Social History Social History   Tobacco Use  . Smoking status: Former Smoker    Packs/day: 0.25    Years: 5.00    Pack years: 1.25    Types: Cigarettes    Last attempt to quit: 01/29/1998    Years since quitting: 19.4  . Smokeless tobacco: Never Used  . Tobacco comment: Quit in 2001  Substance Use Topics  . Alcohol use: No    Alcohol/week: 0.0 oz  .  Drug use: No    Review of Systems  Constitutional: No fever/chills Eyes: No visual changes. ENT: No sore throat. Positive epistaxis.  Cardiovascular: Denies chest pain. Respiratory: Denies shortness of breath. Gastrointestinal: Positive LUQ abdominal pain. No nausea, no vomiting. Positive diarrhea with BRBPR.  No constipation. Genitourinary: Negative for dysuria. Musculoskeletal: Negative for back pain. Skin: Negative for rash. Neurological: Negative for focal weakness or numbness. Positive HA.   10-point ROS otherwise negative.  ____________________________________________   PHYSICAL EXAM:  VITAL SIGNS: ED Triage Vitals [07/20/17 1328]  Enc Vitals Group     BP (!) 156/85     Pulse Rate (!) 59     Resp 16     Temp 97.7 F (36.5 C)     Temp Source Oral     SpO2 99 %     Weight 197 lb (89.4 kg)     Height 5' 2.5" (1.588 m)     Pain Score 0   Constitutional: Alert and oriented. Well appearing and in no acute distress. Eyes: Conjunctivae are normal.  Head: Atraumatic. Nose: No congestion/rhinnorhea. No visualized area of active bleeding or oozing. Pink mucosa throughout the anterior nostrils bilaterally.  Mouth/Throat: Mucous membranes are moist.  Oropharynx non-erythematous. No blood in the posterior pharynx.  Neck: No stridor. Cardiovascular: Normal rate, regular rhythm. Good peripheral circulation. Grossly normal heart sounds.   Respiratory: Normal respiratory  effort.  No retractions. Lungs CTAB. Gastrointestinal: Soft with focal LUQ tenderness with mild voluntary guarding. No rebound. No distention. Rectal exam performed with nurse chaperone. No external hemorrhoids. No fissures or abscess. Normal DRE. No gross blood or melena.  Musculoskeletal: No lower extremity tenderness nor edema. No gross deformities of extremities. Neurologic:  Normal speech and language. No gross focal neurologic deficits are appreciated.  Skin:  Skin is warm, dry and intact. No rash noted.  ____________________________________________   LABS (all labs ordered are listed, but only abnormal results are displayed)  Labs Reviewed  COMPREHENSIVE METABOLIC PANEL - Abnormal; Notable for the following components:      Result Value   Glucose, Bld 108 (*)    All other components within normal limits  CBC - Abnormal; Notable for the following components:   WBC 2.8 (*)    All other components within normal limits  POC OCCULT BLOOD, ED  TYPE AND SCREEN  ABO/RH   ______________________________________  RADIOLOGY  Ct Abdomen Pelvis W Contrast  Result Date: 07/20/2017 CLINICAL DATA:  Pt endorses nosebleed that began spontaneously 1 hour ago, pt is on plavix. Pt pinched nose and bleeding is controlled now. Pt also has had some rectal bleeding this week with diarrhea. Complains of bilateral LQ discomfort since Wednesday. Has h/o colon CA with resection. Patient has also had partial hysterectomy. EXAM: CT ABDOMEN AND PELVIS WITH CONTRAST TECHNIQUE: Multidetector CT imaging of the abdomen and pelvis was performed using the standard protocol following bolus administration of intravenous contrast. CONTRAST:  171mL OMNIPAQUE IOHEXOL 300 MG/ML  SOLN COMPARISON:  01/06/2016 FINDINGS: Lower chest: Stable 9 mm nodule, base of the right middle lobe. No acute findings at the lung bases. Hepatobiliary: Small cyst, segment 3, stable. Liver otherwise unremarkable. Normal gallbladder. No bile duct  dilation. Pancreas: Unremarkable. No pancreatic ductal dilatation or surrounding inflammatory changes. Spleen: Normal in size without focal abnormality. Adrenals/Urinary Tract: No adrenal masses. Bilateral renal cortical thinning. 5.5 cm midpole right renal cyst, increased in size from the prior exam, from 3.5 cm. No other renal masses, no stones and no hydronephrosis. Normal  ureters. Normal bladder. Stomach/Bowel: Status post partial right hemicolectomy.: Is normal in caliber. No wall thickening or inflammation. There are numerous diverticula most prominent along the sigmoid colon. Small bowel is normal in caliber with no wall thickening or inflammation. Stomach is unremarkable. Vascular/Lymphatic: Prominent aortic atherosclerosis. No aneurysm. No pathologically enlarged lymph nodes. Reproductive: Status post hysterectomy. No adnexal masses. Other: No abdominal wall hernia or abnormality. No abdominopelvic ascites. Musculoskeletal: No fracture or acute finding. No osteoblastic or osteolytic lesions. Degenerative changes noted of the visual spine. IMPRESSION: 1. No acute findings within the abdomen or pelvis. 2. There are numerous colonic diverticula without evidence of diverticulitis. This may be the source of rectal bleeding. 3. Stable nodular scarring at the base of the right middle lobe. No follow-up indicated. 4. Small liver cyst and 5.5 cm right renal cyst. 5. Dense aortic atherosclerosis. Electronically Signed   By: Lajean Manes M.D.   On: 07/20/2017 17:52    ____________________________________________   PROCEDURES  Procedure(s) performed:   Procedures  None ____________________________________________   INITIAL IMPRESSION / ASSESSMENT AND PLAN / ED COURSE  Pertinent labs & imaging results that were available during my care of the patient were reviewed by me and considered in my medical decision making (see chart for details).  Patient presents to the emergency department initially with  epistaxis which has resolved.  The anterior nose exam is largely unremarkable.  I do not see an area of active or recent bleeding suitable to cauterization.  Continue to monitor.  Patient also complaining of diarrhea with left domino pain and some blood in the bowel movements.  Unclear if this represents a diverticulitis or colitis.  Plan for CT imaging of the abdomen and pelvis given the patient's focal tenderness on exam and reassess afterwards.   06:15 PM No additional epistaxis while in the ED. CT with no acute findings. No gross blood on rectal exam. Patient hemoccult is negative. Plan for discharge with plan for PCP and GI follow up. Provided information on epistaxis mgmt at home and contact for ENT should epistaxis continue intermittent.   At this time, I do not feel there is any life-threatening condition present. I have reviewed and discussed all results (EKG, imaging, lab, urine as appropriate), exam findings with patient. I have reviewed nursing notes and appropriate previous records.  I feel the patient is safe to be discharged home without further emergent workup. Discussed usual and customary return precautions. Patient and family (if present) verbalize understanding and are comfortable with this plan.  Patient will follow-up with their primary care provider. If they do not have a primary care provider, information for follow-up has been provided to them. All questions have been answered.  ____________________________________________  FINAL CLINICAL IMPRESSION(S) / ED DIAGNOSES  Final diagnoses:  Epistaxis  LUQ abdominal pain  Rectal bleeding     MEDICATIONS GIVEN DURING THIS VISIT:  Medications  oxymetazoline (AFRIN) 0.05 % nasal spray 1 spray (1 spray Each Nare Given 07/20/17 1825)  ondansetron (ZOFRAN) injection 4 mg (4 mg Intravenous Given 07/20/17 1504)  iohexol (OMNIPAQUE) 300 MG/ML solution 100 mL (100 mLs Intravenous Contrast Given 07/20/17 1639)    Note:  This document  was prepared using Dragon voice recognition software and may include unintentional dictation errors.  Nanda Quinton, MD Emergency Medicine    Long, [facility], MD 07/20/17 [phone]

## 2017-07-20 NOTE — ED Triage Notes (Addendum)
Pt endorses nosebleed that began spontaneously 1 hour ago, pt is on plavix. Pt pinched nose and bleeding is controlled now. Pt also has had some rectal bleeding this week with diarrhea.

## 2017-07-20 NOTE — ED Notes (Signed)
Patient verbalizes understanding of discharge instructions. Opportunity for questioning and answers were provided. Armband removed by staff, pt discharged from ED.  

## 2017-07-20 NOTE — ED Notes (Signed)
ED Provider at bedside. 

## 2017-07-20 NOTE — Discharge Instructions (Signed)
As we discussed, there are several techniques you can use to prevent or stop nosebleeds in the future.  Keep your nose moist either with saline spray several times a day or by applying a thin layer of Neosporin, bacitracin, or other antibiotic ointment to the inside of your nose once or twice a day.  If the bleeding starts up again, gently blow your nose into a tissue to clear the blood and clots, then apply 1-2 sprays to each affected nostril of over-the-counter Afrin nasal spray (oxymetazoline).   Then squeeze your nose shut tightly and DO NOT PEEK for at least 15 minutes.  This will resolve most nosebleeds.  If you continue to have trouble after trying these techniques, or anything seems out of the ordinary or concerns you, please return tot he Emergency Department.  

## 2017-07-22 ENCOUNTER — Other Ambulatory Visit: Payer: Self-pay

## 2017-07-22 ENCOUNTER — Emergency Department (HOSPITAL_COMMUNITY): Payer: Medicare Other

## 2017-07-22 ENCOUNTER — Emergency Department (HOSPITAL_COMMUNITY)
Admission: EM | Admit: 2017-07-22 | Discharge: 2017-07-22 | Disposition: A | Discharge status: Home / Self Care | Payer: Medicare Other | Attending: Emergency Medicine | Admitting: Emergency Medicine

## 2017-07-22 ENCOUNTER — Encounter (HOSPITAL_COMMUNITY): Payer: Self-pay | Admitting: Student

## 2017-07-22 ENCOUNTER — Ambulatory Visit: Payer: Self-pay | Admitting: *Deleted

## 2017-07-22 DIAGNOSIS — R42 Dizziness and giddiness: Secondary | ICD-10-CM | POA: Diagnosis not present

## 2017-07-22 DIAGNOSIS — Z7902 Long term (current) use of antithrombotics/antiplatelets: Secondary | ICD-10-CM | POA: Diagnosis not present

## 2017-07-22 DIAGNOSIS — Z7984 Long term (current) use of oral hypoglycemic drugs: Secondary | ICD-10-CM | POA: Insufficient documentation

## 2017-07-22 DIAGNOSIS — R51 Headache: Secondary | ICD-10-CM | POA: Diagnosis not present

## 2017-07-22 DIAGNOSIS — Z79899 Other long term (current) drug therapy: Secondary | ICD-10-CM | POA: Insufficient documentation

## 2017-07-22 DIAGNOSIS — Z87891 Personal history of nicotine dependence: Secondary | ICD-10-CM | POA: Diagnosis not present

## 2017-07-22 DIAGNOSIS — I110 Hypertensive heart disease with heart failure: Secondary | ICD-10-CM | POA: Insufficient documentation

## 2017-07-22 DIAGNOSIS — E1140 Type 2 diabetes mellitus with diabetic neuropathy, unspecified: Secondary | ICD-10-CM | POA: Diagnosis not present

## 2017-07-22 DIAGNOSIS — R001 Bradycardia, unspecified: Secondary | ICD-10-CM | POA: Diagnosis not present

## 2017-07-22 DIAGNOSIS — I5032 Chronic diastolic (congestive) heart failure: Secondary | ICD-10-CM | POA: Diagnosis not present

## 2017-07-22 DIAGNOSIS — J45909 Unspecified asthma, uncomplicated: Secondary | ICD-10-CM | POA: Diagnosis not present

## 2017-07-22 DIAGNOSIS — R519 Headache, unspecified: Secondary | ICD-10-CM

## 2017-07-22 DIAGNOSIS — Z8673 Personal history of transient ischemic attack (TIA), and cerebral infarction without residual deficits: Secondary | ICD-10-CM | POA: Insufficient documentation

## 2017-07-22 DIAGNOSIS — Z85038 Personal history of other malignant neoplasm of large intestine: Secondary | ICD-10-CM | POA: Insufficient documentation

## 2017-07-22 DIAGNOSIS — R040 Epistaxis: Secondary | ICD-10-CM | POA: Diagnosis not present

## 2017-07-22 DIAGNOSIS — I2510 Atherosclerotic heart disease of native coronary artery without angina pectoris: Secondary | ICD-10-CM | POA: Diagnosis not present

## 2017-07-22 LAB — BASIC METABOLIC PANEL
ANION GAP: 8 (ref 5–15)
BUN: 17 mg/dL (ref 6–20)
CO2: 28 mmol/L (ref 22–32)
Calcium: 9.8 mg/dL (ref 8.9–10.3)
Chloride: 105 mmol/L (ref 101–111)
Creatinine, Ser: 0.92 mg/dL (ref 0.44–1.00)
GFR calc non Af Amer: 59 mL/min — ABNORMAL LOW (ref >60)
Glucose, Bld: 95 mg/dL (ref 65–99)
Potassium: 3.9 mmol/L (ref 3.5–5.1)
SODIUM: 141 mmol/L (ref 135–145)

## 2017-07-22 LAB — CBC
HCT: 38.5 % (ref 36.0–46.0)
HEMOGLOBIN: 12.3 g/dL (ref 12.0–15.0)
MCH: 28 pg (ref 26.0–34.0)
MCHC: 31.9 g/dL (ref 30.0–36.0)
MCV: 87.5 fL (ref 78.0–100.0)
Platelets: 153 10*3/uL (ref 150–400)
RBC: 4.4 MIL/uL (ref 3.87–5.11)
RDW: 13.1 % (ref 11.5–15.5)
WBC: 4 10*3/uL (ref 4.0–10.5)

## 2017-07-22 LAB — URINALYSIS, ROUTINE W REFLEX MICROSCOPIC
Bilirubin Urine: NEGATIVE
GLUCOSE, UA: NEGATIVE mg/dL
KETONES UR: NEGATIVE mg/dL
Leukocytes, UA: NEGATIVE
Nitrite: NEGATIVE
PROTEIN: NEGATIVE mg/dL
Specific Gravity, Urine: 1.015 (ref 1.005–1.030)
pH: 5 (ref 5.0–8.0)

## 2017-07-22 LAB — PROTIME-INR
INR: 0.95
Prothrombin Time: 12.6 seconds (ref 11.4–15.2)

## 2017-07-22 MED ORDER — SODIUM CHLORIDE 0.9 % IV BOLUS
1000.0000 mL | Freq: Once | INTRAVENOUS | Status: AC
  Administered 2017-07-22: 1000 mL via INTRAVENOUS

## 2017-07-22 MED ORDER — METOCLOPRAMIDE HCL 5 MG/ML IJ SOLN
10.0000 mg | Freq: Once | INTRAMUSCULAR | Status: AC
  Administered 2017-07-22: 10 mg via INTRAVENOUS
  Filled 2017-07-22: qty 2

## 2017-07-22 MED ORDER — DIPHENHYDRAMINE HCL 50 MG/ML IJ SOLN
12.5000 mg | Freq: Once | INTRAMUSCULAR | Status: AC
  Administered 2017-07-22: 12.5 mg via INTRAVENOUS
  Filled 2017-07-22: qty 1

## 2017-07-22 NOTE — ED Triage Notes (Signed)
Pt reports seen here Saturday for nose bleed. Pt states began bleeding again last night. Pt reports bleeding was worse last night than Saturday. Woke today with a left side headache, lightheaded and not feeling well. Pt awake, alert, oriented x4, no neuro deficit noted. VSS. Bleeding from nose stopped last night.

## 2017-07-22 NOTE — ED Notes (Signed)
Gave pt urine specimen packet

## 2017-07-22 NOTE — Telephone Encounter (Signed)
Checked into [facility] ED

## 2017-07-22 NOTE — Progress Notes (Signed)
Subjective:    Patient ID: Samantha [name], female    DOB: 02-20-1940, 77 y.o.   MRN: [id number]  HPI The patient is here for follow up.  Ed 07/20/17 for epitaxis. She was having epistaxis and worsening of left sided abdominal pain and blood in the bowel movements. The epistaxis was intermittent for two weeks and was small volume bleeding.  She is taking plavix daily.  The day she went to the ED she had a larger volume of blood and it persisted longer.  The bleeding stopped with pressure.  She did have some nausea and taste of blood.  She had some BRBPR after wiping after a bowel movement.  She did have dairrhea over th past week and LUQ pain which was more severe than normal.  She did not have any black, tarry stools.  Abdomen was slightly tender in LUQ, recantal exam was normal w/o melena or blood. hemmocult neagtive.   No acive nose bleed.  Ct A/P:  No acute findings.  Colon divericula.  H/H normal.  CMP normal.    ED 07/22/17:  nonintractable headache, dizziness, epistaxis.  No headache was located on her left side of her head, was dull and moderate in nature.  There is no radiation.  She was experiencing lightheadedness and continued to have episodes of epistaxis-her last episode was the day prior and it lasted 20 minutes and resolved with pressure.  EKG showed sinus bradycardia with first-degree AV block, otherwise normal.  Urinalysis showed many bacteria but no leukocytes.  She is not experiencing any urinary symptoms.  Blood work unremarkable.  CT of the head within normal limits.  Headache resolved with IV fluids, Benadryl and Reglan.  BP was elevated and she is taking her medication-advised follow-up.  Discharged home   Epistaxis:  She is using afrin, has her humidifier and applying neosporin in the nostrils.  She has not had any nosebleeds since two nights ago.  She denies any pain.  She is concerned that the bleeding will restart.  Headache:  Her headache in the emergency room resolved  with IVF, benadryl and relgan in the ED. This morning she thought she was getting a headache, but it did not come.   Hypertension:  Her BP is usually well controlled at home.  She does monitor  It at home regularly.   It is typically not as high as it has been more recently.  She thinks some of the elevation may be related to increased stress trying to take care of her husband.  LUQ pain;  She still has intermittent left upper abdominal pain.  She had a laparoscopic partial colectomy in 10/2014 and has had pain in the LUQ since then from the surgical scar.  This was evaluated by the surgeon a few years ago and there was no hernia and she knows this is something that she needs to live with.  Rectal bleeding: She has discussed her rectal bleeding with GI in the past.  He thought this may have been related to hemorrhoids.  No concerning findings on CT the abdomen and pelvis.  She does have diverticulosis.  She tends to have chronic diarrhea that is controlled with daily Imodium.  She has not had any bleeding in the last couple of days.  Medications and allergies reviewed with patient and updated if appropriate.  Patient Active Problem List   Diagnosis Date Noted  . Poor balance 03/14/2017  . Sprain of right ankle 03/14/2017  . Right shoulder  pain 03/14/2017  . Hair loss 10/19/2016  . Difficulty urinating 04/18/2016  . Angina pectoris ([place]) 03/22/2016  . Spondylosis of lumbar region without myelopathy or radiculopathy 02/02/2016  . CAD in native artery 08/22/2015  . Hypertensive heart disease 07/07/2015  . Essential hypertension 07/07/2015  . SOB (shortness of breath) 07/07/2015  . Dizziness 07/07/2015  . Plantar fasciitis, left 03/22/2015  . Hx of colon cancer, stage I 01/11/2015  . Coronary artery disease due to lipid rich plaque 01/05/2015  . Chronic diastolic CHF (congestive heart failure), NYHA class 2 ([place]) 01/05/2015  . Malignant neoplasm of ascending colon  pT1, pN0, rM0 s/p robotic  colectomy 11/11/2014 11/11/2014  . Migraine (Ocular) 01/05/2014  . Gait difficulty 10/06/2013  . Pain in joint, shoulder region 11/27/2012  . VBI (vertebrobasilar insufficiency) 08/22/2012  . Right knee pain 07/29/2012  . TIA (transient ischemic attack) 06/27/2012  . Edema 02/01/2012  . DJD (degenerative joint disease) 02/02/2011  . 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 Postherpetic neuralgia ? 01/02/2010  . NECK PAIN 10/05/2009  . Palpitations 11/23/2008  . UTI'S, RECURRENT 09/28/2008  . Fibromyalgia 08/15/2007  . 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 Hyperlipidemia 05/21/2006  . Reactive airway disease 01/29/2002    Current Outpatient Medications on File Prior to Visit  Medication Sig Dispense Refill  . acetaminophen (TYLENOL) 500 MG tablet Take 1,000 mg by mouth every 6 (six) hours as needed for moderate pain.    [name] albuterol (PROVENTIL HFA;VENTOLIN HFA) 108 (90 Base) MCG/ACT inhaler Inhale 2 puffs into the lungs every 6 (six) hours as needed for wheezing or shortness of breath. 18 g 3  . atorvastatin (LIPITOR) 20 MG tablet TAKE 1 TABLET (20 MG TOTAL) BY MOUTH DAILY. 90 tablet 1  . budesonide-formoterol (SYMBICORT) 80-4.5 MCG/ACT inhaler Inhale 2 puffs into the lungs 2 (two) times daily. 1 Inhaler 3  . butalbital-acetaminophen-caffeine (FIORICET) 50-325-40 MG tablet Take 1 tablet by mouth every 6 (six) hours as needed for headache or migraine. 30 tablet 5  . BYSTOLIC 10 MG tablet TAKE 3 TABLETS(30 MG) BY MOUTH DAILY (Patient taking differently: TAKE 1 TABLET (10 MG TOTALLY) BY MOUTH DAILY) 90 tablet 8  . CALCIUM PO Take 1 tablet by mouth every morning.     . cetirizine (ZYRTEC) 10 MG tablet Take 1 tablet (10 mg total) by mouth daily. 90 tablet 1  . cholecalciferol (VITAMIN D) 1000 UNITS tablet Take 1,000 Units by mouth every morning.     . cholestyramine (QUESTRAN) 4 g packet Take 4 g by mouth every other day.    . clopidogrel (PLAVIX) 75 MG tablet TAKE 1  TABLET(75 MG) BY MOUTH DAILY 90 tablet 1  . furosemide (LASIX) 20 MG tablet TAKE 1 TABLET(20 MG) BY MOUTH DAILY 90 tablet 2  . hydrocortisone 2.5 % cream Apply topically 2 (two) times daily. (Patient taking differently: Apply 1 application topically 2 (two) times daily as needed (for rash). ) 30 g 0  . isosorbide mononitrate (IMDUR) 30 MG 24 hr tablet TAKE 1 TABLET(30 MG) BY MOUTH DAILY 90 tablet 2  . LYRICA 100 MG capsule TAKE ONE CAPSULE BY MOUTH EVERY DAY AS NEEDED FOR PAIN (Patient taking differently: TAKE 1 CAPSULE (100 MG TOTALLY) BY MOUTH EVERY DAY AS NEEDED FOR PAIN) 30 capsule 2  . metFORMIN (GLUCOPHAGE-XR) 500 MG 24 hr tablet TAKE 1 TABLET(500 MG) BY MOUTH DAILY WITH BREAKFAST 90 tablet 1  . Multiple Vitamin (MULTIVITAMIN) capsule Take 1 capsule by mouth  daily.      . nitroGLYCERIN (NITROSTAT) 0.4 MG SL tablet Place 0.4 mg under the tongue every 5 (five) minutes as needed for chest pain (x 3 doses).    Vladimir Faster Glycol-Propyl Glycol (SYSTANE) 0.4-0.3 % GEL Place 1 drop into both eyes daily as needed (dry eyes).     . Potassium 99 MG TABS Take 99 mg by mouth daily.     . promethazine (PHENERGAN) 25 MG tablet Take 1 tablet (25 mg total) by mouth every 8 (eight) hours as needed for nausea or vomiting. 20 tablet 0  . ranitidine (ZANTAC) 300 MG tablet TAKE 1 TABLET(300 MG) BY MOUTH AT BEDTIME 90 tablet 1  . lisinopril (PRINIVIL,ZESTRIL) 20 MG tablet Take 1 tablet (20 mg total) by mouth daily. 90 tablet 3   No current facility-administered medications on file prior to visit.     Past Medical History:  Diagnosis Date  . Abnormal CT of the chest 2008   last CT4-l 2009:  . No f/u suggested   . Allergy   . Asthma   . CAD (coronary artery disease)    a. Coronary CTA 10/16: Coronary Ca score 211, mod non-obstructive CAD with LM mild plaque (25-50%), mid LAD 50-69%. b. Neg nuc 06/2015.  . Cataract    BILATERAL-REMOVED  . Chronic diastolic CHF (congestive heart failure) ([place])   . Collagen  vascular disease ([place])    "arterial sclerosis" per pt  . Complication of anesthesia    trouble waking up  . Fibromyalgia   . GERD (gastroesophageal reflux disease)   . H/O hiatal hernia   . Heart murmur   . Hyperlipidemia   . Hypertension   . Malignant neoplasm of ascending colon ([place]) 2016   Minimally invasive right hemicolectomy to be done   . Neuromuscular disorder ([facility])    FIBROMYALGIA  . Ocular migraine   . OSA (obstructive sleep apnea) 09/2007   dx w/ a sleep study, not on  CPAP  . Osteoarthritis   . Osteoporosis   . Pneumonia    "double" in 2004  . PONV (postoperative nausea and vomiting)   . Reactive airway disease 01/29/2002   dx of pseudoasthma / vcd in 2005 and nl sprirometry History of dyspnea, 2011,  improved after several medications were changed around Question of COPD, disproved July 06, 2009 with nl pft's      . Rheumatoid factor positive   . Shingles 11/2009  . Sleep apnea   . Stroke ([place])   . TIA (transient ischemic attack)    x2 - on Plavix for this  . Torn rotator cuff    right worse than left, both are torn  . Tumor, 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  . Type II diabetes mellitus ([place])   . 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 Vaginal dysplasia     Past Surgical History:  Procedure Laterality Date  . ABDOMINAL HYSTERECTOMY  1980   NO oophorectomy per pt   . ANTERIOR CERVICAL DECOMP/DISCECTOMY FUSION  2001   C 3, C4 and C5 plate and screws  . BREAST BIOPSY Right 1999  . BUNIONECTOMY Left ~ 1977  . CATARACT EXTRACTION W/ INTRAOCULAR LENS  IMPLANT, BILATERAL  2012  . COLON SURGERY  10/2014  . EYE SURGERY Bilateral    torq lens for cataracts  . LEFT HEART CATH AND CORONARY ANGIOGRAPHY N/A 03/22/2016   Procedure: Left Heart Cath and Coronary Angiography;  Surgeon: Belva Crome, MD;  Location:  [facility];  Service: Cardiovascular;  Laterality: N/A;  . THYROIDECTOMY, PARTIAL  1960's  . VAGINAL MASS EXCISION  1994   "Laser surgery for vaginal  cancer; followed by chemotherapy" (06/27/2012)    Social History   Socioeconomic History  . Marital status: Married    Spouse name: Ilona Sorrel  . Number of children: 2  . Years of education: masters  . Highest education level: Not on file  Occupational History  . Occupation: Retired, disable since 2000    Employer: RETIRED  Social Needs  . Financial resource strain: Not on file  . Food insecurity:    Worry: Not on file    Inability: Not on file  . Transportation needs:    Medical: Not on file    Non-medical: Not on file  Tobacco Use  . Smoking status: Former Smoker    Packs/day: 0.25    Years: 5.00    Pack years: 1.25    Types: Cigarettes    Last attempt to quit: 01/29/1998    Years since quitting: 19.4  . Smokeless tobacco: Never Used  . Tobacco comment: Quit in 2001  Substance and Sexual Activity  . Alcohol use: No    Alcohol/week: 0.0 oz  . Drug use: No  . Sexual activity: Never  Lifestyle  . Physical activity:    Days per week: Not on file    Minutes per session: Not on file  . Stress: Not on file  Relationships  . Social connections:    Talks on phone: Not on file    Gets together: Not on file    Attends religious service: Not on file    Active member of club or organization: Not on file    Attends meetings of clubs or organizations: Not on file    Relationship status: Not on file  Other Topics Concern  . Not on file  Social History Narrative   On disability since 2000--- also husband has MS   Education. College.   Right handed.    Family History  Problem Relation Age of Onset  . Heart disease Father   . Heart disease Mother   . Lung cancer Mother   . Allergies Sister   . Parkinsonism Sister        possible  . Asthma Sister   . Asthma Paternal Grandmother   . Stroke Paternal Grandmother   . Heart disease Unknown        paternal grandparents, maternal grandparents,   . Heart disease Brother   . Emphysema Brother   . Aneurysm Brother        x3    . Kidney failure Brother   . Diabetes Brother   . Diabetes Brother   . Stroke Brother   . Stroke Maternal Grandmother   . Breast cancer Neg Hx   . Colon cancer Neg Hx   . Heart attack Neg Hx     Review of Systems  Constitutional: Negative for appetite change, chills and fever.  HENT: Positive for nosebleeds.   Respiratory: Positive for shortness of breath (some - using symbicort - helpful) and wheezing (chronic). Negative for cough.   Cardiovascular: Positive for palpitations and leg swelling (chronic). Negative for chest pain.  Gastrointestinal: Positive for abdominal pain. Negative for anal bleeding, blood in stool, constipation, diarrhea and nausea.  Neurological: Negative for headaches.       Objective:   Vitals:   07/23/17 1309  BP: (!) 156/78  Pulse: 77  Resp: 16  Temp: 98.6 F (37 C)  SpO2: 98%   BP Readings from Last 3 Encounters:  07/23/17 (!) 156/78  07/22/17 (!) 169/79  07/20/17 (!) 152/82   Wt Readings from Last 3 Encounters:  07/22/17 197 lb (89.4 kg)  07/20/17 197 lb (89.4 kg)  04/23/17 202 lb (91.6 kg)   There is no height or weight on file to calculate BMI.   Physical Exam    Constitutional: Appears well-developed and well-nourished. No distress.  HENT:  Normocephalic and atraumatic.  Neck supple. No tracheal deviation present. No thyromegaly present.  No cervical lymphadenopathy.  Nostrils normal-appearing without active bleeding or scabbed blood Cardiovascular: Normal rate, regular rhythm and normal heart sounds.    .  No edema Pulmonary/Chest: Effort normal and breath sounds normal. No respiratory distress. No has no wheezes. No rales. Abdomen: Soft, nondistended, slightly obese, scar in left upper quadrants that is slightly tender with palpation.  No obvious hernia.  No other tenderness Skin: Skin is warm and dry. Not diaphoretic.  Psychiatric: Normal mood and affect. Behavior is normal.      Assessment & Plan:    See Problem List for  Assessment and Plan of chronic medical problems.

## 2017-07-22 NOTE — Telephone Encounter (Signed)
Pt reports "Severe" nose bleed Saturday. States bled for 1 hour,"Finally got it stopped." Seen in ED; at that time also reported abdominal pain, intermittent rectal bleeding. Lab work, CT abdomen done "No acute findings, numerous colonic diverticula"; pt discharged. Reports had "Even worse nose bleed yesterday" (Sunday.)  States heavy bleeding x 20 minutes, able to get stopped with pressure. Reports today feels "Sleepy, out of it, just not right." Reports lightheadedness, constant, moderate, "Very weak."  Also reports left sided headache last night. Pt is on Plavix. H/O CVA, TIAs.  Pt directed to ED. States daughter will drive.   Reason for Disposition . Patient sounds very sick or weak to the triager  Answer Assessment - Initial Assessment Questions 1. DESCRIPTION: "Describe your dizziness."     Lightheaded 2. LIGHTHEADED: "Do you feel lightheaded?" (e.g., somewhat faint, woozy, weak upon standing)     yes 3. VERTIGO: "Do you feel like either you or the room is spinning or tilting?" (i.e. vertigo)     no 4. SEVERITY: "How bad is it?"  "Do you feel like you are going to faint?" "Can you stand and walk?"   - MILD - walking normally   - MODERATE - interferes with normal activities (e.g., work, school)    - SEVERE - unable to stand, requires support to walk, feels like passing out now.      Constant, worse with standing 5. ONSET:  "When did the dizziness begin?"     Yesterday after severe nose bleed. Also nose bleed Saturday. 6. AGGRAVATING FACTORS: "Does anything make it worse?" (e.g., standing, change in head position)     Standing but constant. 7. HEART RATE: "Can you tell me your heart rate?" "How many beats in 15 seconds?"  (Note: not all patients can do this)       Not done 8. CAUSE: "What do you think is causing the dizziness?"     Nose bleeds 9. RECURRENT SYMPTOM: "Have you had dizziness before?" If so, ask: "When was the last time?" "What happened that time?"      no 10. OTHER  SYMPTOMS: "Do you have any other symptoms?" (e.g., fever, chest pain, vomiting, diarrhea, bleeding)       Sleepy, "Very weak" Feels out of it, just not right."  Protocols used: DIZZINESS [facility]

## 2017-07-22 NOTE — ED Notes (Signed)
Escorted patient to bathroom via wheelchair, pt states she feels better.

## 2017-07-22 NOTE — ED Provider Notes (Signed)
[facility] EMERGENCY DEPARTMENT Provider Note   CSN: [id number] Arrival date & time: 07/22/17  1342  History   Chief Complaint Chief Complaint  Patient presents with  . Headache  . Dizziness  . Epistaxis    HPI Samantha [name] is a 77 y.o. female.  H/o HTN, CAD on plavix, migraine who presents with 3 days intermittent left sided headache x 3 days. Accompanied by lightheadedness. Has had intermittent epistaxis, last was yesterday, lasted for 20 minutes, resolved with direct pressure. Was seen in ED 2 days ago for epistaxis.   The history is provided by the patient.  Headache   This is a new problem. The current episode started more than 2 days ago. The problem occurs constantly. The problem has not changed since onset.The headache is associated with nothing. The pain is located in the left unilateral region. The quality of the pain is described as dull. The pain is moderate. The pain does not radiate. Pertinent negatives include no fever, no palpitations, no shortness of breath, no nausea and no vomiting. Associated symptoms comments: Recurrent epistaxis, lightheadedness. She has tried nothing for the symptoms.    Past Medical History:  Diagnosis Date  . Abnormal CT of the chest 2008   last CT4-l 2009:  . No f/u suggested   . Allergy   . Asthma   . CAD (coronary artery disease)    a. Coronary CTA 10/16: Coronary Ca score 211, mod non-obstructive CAD with LM mild plaque (25-50%), mid LAD 50-69%. b. Neg nuc 06/2015.  . Cataract    BILATERAL-REMOVED  . Chronic diastolic CHF (congestive heart failure) ([place])   . Collagen vascular disease ([place])    "arterial sclerosis" per pt  . Complication of anesthesia    trouble waking up  . Fibromyalgia   . GERD (gastroesophageal reflux disease)   . H/O hiatal hernia   . Heart murmur   . Hyperlipidemia   . Hypertension   . 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 Neuromuscular disorder ([facility])    FIBROMYALGIA  . Ocular migraine   . OSA (obstructive sleep apnea) 09/2007   dx w/ a sleep study, not on  CPAP  . Osteoarthritis   . Osteoporosis   . Pneumonia    "double" in 2004  . PONV (postoperative nausea and vomiting)   . Reactive airway disease 01/29/2002   dx of pseudoasthma / vcd in 2005 and nl sprirometry History of dyspnea, 2011,  improved after several medications were changed around Question of COPD, disproved July 06, 2009 with nl pft's      . Rheumatoid factor positive   . Shingles 11/2009  . Sleep apnea   . Stroke ([place])   . TIA (transient ischemic attack)    x2 - on Plavix for this  . Torn rotator cuff    right worse than left, both are torn  . Tumor, 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  . Type II diabetes mellitus ([place])   . 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 Vaginal dysplasia     Patient Active Problem List   Diagnosis Date Noted  . Poor balance 03/14/2017  . Sprain of right ankle 03/14/2017  . Right shoulder pain 03/14/2017  . Hair loss 10/19/2016  . Difficulty urinating 04/18/2016  . Angina pectoris ([place]) 03/22/2016  . Spondylosis of lumbar region without myelopathy or radiculopathy 02/02/2016  . CAD in  native artery 08/22/2015  . Hypertensive heart disease 07/07/2015  . Essential hypertension 07/07/2015  . SOB (shortness of breath) 07/07/2015  . Dizziness 07/07/2015  . Plantar fasciitis, left 03/22/2015  . Hx of colon cancer, stage I 01/11/2015  . Coronary artery disease due to lipid rich plaque 01/05/2015  . Chronic diastolic CHF (congestive heart failure), NYHA class 2 ([place]) 01/05/2015  . 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 Migraine (Ocular) 01/05/2014  . Gait difficulty 10/06/2013  . Pain in joint, shoulder region 11/27/2012  . VBI (vertebrobasilar insufficiency) 08/22/2012  . Right knee pain 07/29/2012  . TIA (transient ischemic attack)  06/27/2012  . Edema 02/01/2012  . DJD (degenerative joint disease) 02/02/2011  . 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 Postherpetic neuralgia ? 01/02/2010  . NECK PAIN 10/05/2009  . Palpitations 11/23/2008  . UTI'S, RECURRENT 09/28/2008  . Fibromyalgia 08/15/2007  . 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 Hyperlipidemia 05/21/2006  . Reactive airway disease 01/29/2002    Past Surgical History:  Procedure Laterality Date  . ABDOMINAL HYSTERECTOMY  1980   NO oophorectomy per pt   . ANTERIOR CERVICAL DECOMP/DISCECTOMY FUSION  2001   C 3, C4 and C5 plate and screws  . BREAST BIOPSY Right 1999  . BUNIONECTOMY Left ~ 1977  . CATARACT EXTRACTION W/ INTRAOCULAR LENS  IMPLANT, BILATERAL  2012  . COLON SURGERY  10/2014  . EYE SURGERY Bilateral    torq lens for cataracts  . LEFT HEART CATH AND CORONARY ANGIOGRAPHY N/A 03/22/2016   Procedure: Left Heart Cath and Coronary Angiography;  Surgeon: Belva Crome, MD;  Location: [facility];  Service: Cardiovascular;  Laterality: N/A;  . THYROIDECTOMY, PARTIAL  1960's  . VAGINAL MASS EXCISION  1994   "Laser surgery for vaginal cancer; followed by chemotherapy" (06/27/2012)     OB History    Gravida  2   Para      Term      Preterm      AB      Living        SAB      TAB      Ectopic      Multiple      Live Births               Home Medications    Prior to Admission medications   Medication Sig Start Date End Date Taking? Authorizing Provider  acetaminophen (TYLENOL) 500 MG tablet Take 1,000 mg by mouth every 6 (six) hours as needed for moderate pain.    [provider]  albuterol (PROVENTIL HFA;VENTOLIN HFA) 108 (90 Base) MCG/ACT inhaler Inhale 2 puffs into the lungs every 6 (six) hours as needed for wheezing or shortness of breath. 02/07/17   [name], MD  atorvastatin (LIPITOR) 20 MG tablet TAKE 1 TABLET (20 MG TOTAL) BY MOUTH DAILY. 01/02/17   [name], MD    budesonide-formoterol [facility]) 80-4.5 MCG/ACT inhaler Inhale 2 puffs into the lungs 2 (two) times daily. 04/18/16   [name], MD  butalbital-acetaminophen-caffeine (FIORICET) [id number] MG tablet Take 1 tablet by mouth every 6 (six) hours as needed for headache or migraine. 01/14/17   [name], MD  BYSTOLIC 10 MG tablet TAKE 3 TABLETS(30 MG) BY MOUTH DAILY Patient taking differently: TAKE 1 TABLET (10 MG TOTALLY) BY MOUTH DAILY 04/30/17   Richardson Dopp T, PA-C  CALCIUM  PO Take 1 tablet by mouth every morning.     [provider]  cetirizine (ZYRTEC) 10 MG tablet Take 1 tablet (10 mg total) by mouth daily. 01/25/17   [name], MD  cholecalciferol (VITAMIN D) 1000 UNITS tablet Take 1,000 Units by mouth every morning.     [provider]  cholestyramine (QUESTRAN) 4 g packet Take 4 g by mouth every other day.    [provider]  clopidogrel (PLAVIX) 75 MG tablet TAKE 1 TABLET(75 MG) BY MOUTH DAILY 07/08/17   Burns, Claudina Lick, MD  furosemide (LASIX) 20 MG tablet TAKE 1 TABLET(20 MG) BY MOUTH DAILY 03/25/17   [name], MD  hydrocortisone 2.5 % cream Apply topically 2 (two) times daily. Patient taking differently: Apply 1 application topically 2 (two) times daily as needed (for rash).  03/17/15   Colon Branch, MD  isosorbide mononitrate (IMDUR) 30 MG 24 hr tablet TAKE 1 TABLET(30 MG) BY MOUTH DAILY 04/26/17   [name], MD  lisinopril (PRINIVIL,ZESTRIL) 20 MG tablet Take 1 tablet (20 mg total) by mouth daily. 01/14/17 07/20/17  [name], MD  LYRICA 100 MG capsule TAKE ONE CAPSULE BY MOUTH EVERY DAY AS NEEDED FOR PAIN Patient taking differently: TAKE 1 CAPSULE (100 MG TOTALLY) BY MOUTH EVERY DAY AS NEEDED FOR PAIN 12/25/16   [name], MD  metFORMIN (GLUCOPHAGE-XR) 500 MG 24 hr tablet TAKE 1 TABLET(500 MG) BY MOUTH DAILY WITH BREAKFAST 05/23/17   [name], MD  Multiple Vitamin (MULTIVITAMIN) capsule Take 1 capsule by mouth  daily.      [provider]  nitroGLYCERIN (NITROSTAT) 0.4 MG SL tablet Place 0.4 mg under the tongue every 5 (five) minutes as needed for chest pain (x 3 doses).    [provider]  Polyethyl Glycol-Propyl Glycol (SYSTANE) 0.4-0.3 % GEL Place 1 drop into both eyes daily as needed (dry eyes).     [provider]  Potassium 99 MG TABS Take 99 mg by mouth daily.     [provider]  promethazine (PHENERGAN) 25 MG tablet Take 1 tablet (25 mg total) by mouth every 8 (eight) hours as needed for nausea or vomiting. 02/07/17   [name], MD  ranitidine (ZANTAC) 300 MG tablet TAKE 1 TABLET(300 MG) BY MOUTH AT BEDTIME 12/28/16   [name], MD    Family History Family History  Problem Relation Age of Onset  . Heart disease Father   . Heart disease Mother   . Lung cancer Mother   . Allergies Sister   . Parkinsonism Sister        possible  . Asthma Sister   . Asthma Paternal Grandmother   . Stroke Paternal Grandmother   . Heart disease Unknown        paternal grandparents, maternal grandparents,   . Heart disease Brother   . Emphysema Brother   . Aneurysm Brother        x3  . Kidney failure Brother   . Diabetes Brother   . Diabetes Brother   . Stroke Brother   . Stroke Maternal Grandmother   . Breast cancer Neg Hx   . Colon cancer Neg Hx   . Heart attack Neg Hx     Social History Social History   Tobacco Use  . Smoking status: Former Smoker    Packs/day: 0.25    Years: 5.00    Pack years: 1.25    Types: Cigarettes  Last attempt to quit: 01/29/1998    Years since quitting: 19.4  . Smokeless tobacco: Never Used  . Tobacco comment: Quit in 2001  Substance Use Topics  . Alcohol use: No    Alcohol/week: 0.0 oz  . Drug use: No     Allergies   Bactrim [sulfamethoxazole-trimethoprim]; Cefuroxime axetil; Oxycodone; Pravastatin; Seldane [terfenadine]; Zocor [simvastatin]; Tramadol; Lime flavor [flavoring agent]; and Tape   Review of  Systems Review of Systems  Constitutional: Negative for fatigue and fever.  HENT: Positive for nosebleeds. Negative for ear pain and sore throat.   Eyes: Negative for pain and redness.  Respiratory: Negative for cough and shortness of breath.   Cardiovascular: Negative for chest pain and palpitations.  Gastrointestinal: Positive for blood in stool. Negative for abdominal pain, nausea and vomiting.  Genitourinary: Negative for hematuria.  Musculoskeletal: Negative for arthralgias and back pain.  Skin: Negative for color change and rash.  Neurological: Positive for light-headedness and headaches. Negative for seizures and syncope.  All other systems reviewed and are negative.    Physical Exam Updated Vital Signs BP (!) 169/79   Pulse 72   Temp 97.9 F (36.6 C) (Oral)   Resp 18   Ht 5\' 2"  (1.575 m)   Wt 89.4 kg (197 lb)   SpO2 100%   BMI 36.03 kg/m   Physical Exam  Constitutional: She is oriented to person, place, and time. She appears well-developed and well-nourished. No distress.  HENT:  Head: Normocephalic and atraumatic.  Nose: No epistaxis.  Mouth/Throat: Oropharynx is clear and moist.  Friable nasal septal mucosa bilaterally, no active bleeding  Eyes: Pupils are equal, round, and reactive to light. Conjunctivae and EOM are normal.  Neck: Neck supple.  Cardiovascular: Normal rate, regular rhythm and intact distal pulses.  No murmur heard. Pulmonary/Chest: Effort normal and breath sounds normal. No stridor. No respiratory distress.  Abdominal: Soft. She exhibits no distension and no mass. There is no tenderness. There is no guarding.  Musculoskeletal: She exhibits no edema.  Neurological: She is alert and oriented to person, place, and time. No cranial nerve deficit or sensory deficit.  CN II-XII intact, intact strength and sensation throughout, neg pronator drift, nl finger to nose bilaterally, nl gait  Skin: Skin is warm and dry.  Psychiatric: She has a normal mood  and affect.  Nursing note and vitals reviewed.    ED Treatments / Results  Labs (all labs ordered are listed, but only abnormal results are displayed) Labs Reviewed  BASIC METABOLIC PANEL - Abnormal; Notable for the following components:      Result Value   GFR calc non Af Amer 59 (*)    All other components within normal limits  URINALYSIS, ROUTINE W REFLEX MICROSCOPIC - Abnormal; Notable for the following components:   Hgb urine dipstick MODERATE (*)    Bacteria, UA MANY (*)    All other components within normal limits  CBC  PROTIME-INR    EKG EKG Interpretation  Date/Time:  Monday July 22 2017 15:02:58 EDT Ventricular Rate:  59 PR Interval:  214 QRS Duration: 96 QT Interval:  446 QTC Calculation: 441 R Axis:   -27 Text Interpretation:  Sinus bradycardia with 1st degree A-V block Otherwise normal ECG No significant change since last tracing Confirmed by Deno Etienne [phone]) on 07/22/2017 7:14:19 PM   Radiology Ct Head Wo Contrast  Result Date: 07/22/2017 CLINICAL DATA:  Left-sided headache.  Epistaxis. EXAM: CT HEAD WITHOUT CONTRAST TECHNIQUE: Contiguous axial images were obtained from  the base of the skull through the vertex without intravenous contrast. COMPARISON:  12/03/2013 head CT FINDINGS: Brain: There is no mass, hemorrhage or extra-axial collection. The size and configuration of the ventricles and extra-axial CSF spaces are normal. There is no acute or chronic infarction. The brain parenchyma is normal. Vascular: No abnormal hyperdensity of the major intracranial arteries or dural venous sinuses. No intracranial atherosclerosis. Skull: The visualized skull base, calvarium and extracranial soft tissues are normal. Sinuses/Orbits: No fluid levels or advanced mucosal thickening of the visualized paranasal sinuses. No mastoid or middle ear effusion. The orbits are normal. IMPRESSION: Normal head CT. Electronically Signed   By: Ulyses Jarred M.D.   On: 07/22/2017 20:00     Procedures Procedures (including critical care time)  Medications Ordered in ED Medications  sodium chloride 0.9 % bolus 1,000 mL (0 mLs Intravenous Stopped 07/22/17 2038)  metoCLOPramide (REGLAN) injection 10 mg (10 mg Intravenous Given 07/22/17 1924)  diphenhydrAMINE (BENADRYL) injection 12.5 mg (12.5 mg Intravenous Given 07/22/17 1924)     Initial Impression / Assessment and Plan / ED Course  I have reviewed the triage vital signs and the nursing notes.  Pertinent labs & imaging results that were available during my care of the patient were reviewed by me and considered in my medical decision making (see chart for details).     Samantha [name] is a 77 y.o. female with PMHx of H/o HTN, CAD on plavix, migraine who presents with 3 days intermittent left sided headache x 3 days. Reviewed and confirmed nursing documentation for past medical history, family history, social history. VS afebrile, BP 159/87, otherwise wnl. Exam remarkable for nl neurologic exam, not actively bleeding. Suspect tension headache, though in setting of HTN and plavix, will r/o hemorrhagic stroke. Considering anemia. Endorses small amount of BRB per rectum during visit 2 days ago, CT at that time with numerous colonic diverticula. Pt denies continued bleeding. abd benign.   UA with many bacteria, though no leuks, no urinary symtoms. BMP wnl. CBC wnl. CT head wnl. Headache resolved with IVF, benadryl, reglan. Instructed to f/u with PCP for BP given stated compliance with medications and persistently elevated blood pressures in department.   Old records reviewed. Labs reviewed by me and used in the medical decision making.  Imaging viewed and interpreted by me and used in the medical decision making (formal interpretation from radiologist). EKG reviewed by me and used in the medical decision making. D/c home in stable condition, return precautions discussed. Patient agreeable with plan for d/c home.   Final Clinical  Impressions(s) / ED Diagnoses   Final diagnoses:  Epistaxis  Nonintractable headache, unspecified chronicity pattern, unspecified headache type      Norm Salt, MD 07/23/17 0014    Deno Etienne, DO 07/23/17 1528

## 2017-07-23 ENCOUNTER — Ambulatory Visit (INDEPENDENT_AMBULATORY_CARE_PROVIDER_SITE_OTHER): Payer: Medicare Other | Admitting: Internal Medicine

## 2017-07-23 ENCOUNTER — Encounter: Payer: Self-pay | Admitting: Internal Medicine

## 2017-07-23 VITALS — BP 156/78 | HR 77 | Temp 98.6°F | Resp 16

## 2017-07-23 DIAGNOSIS — G4489 Other headache syndrome: Secondary | ICD-10-CM

## 2017-07-23 DIAGNOSIS — R040 Epistaxis: Secondary | ICD-10-CM | POA: Diagnosis not present

## 2017-07-23 DIAGNOSIS — G8929 Other chronic pain: Secondary | ICD-10-CM

## 2017-07-23 DIAGNOSIS — K625 Hemorrhage of anus and rectum: Secondary | ICD-10-CM | POA: Diagnosis not present

## 2017-07-23 DIAGNOSIS — R51 Headache: Secondary | ICD-10-CM

## 2017-07-23 DIAGNOSIS — I2510 Atherosclerotic heart disease of native coronary artery without angina pectoris: Secondary | ICD-10-CM

## 2017-07-23 DIAGNOSIS — I10 Essential (primary) hypertension: Secondary | ICD-10-CM | POA: Diagnosis not present

## 2017-07-23 DIAGNOSIS — R519 Headache, unspecified: Secondary | ICD-10-CM | POA: Insufficient documentation

## 2017-07-23 DIAGNOSIS — R1012 Left upper quadrant pain: Secondary | ICD-10-CM

## 2017-07-23 NOTE — Assessment & Plan Note (Signed)
Blood pressure has been elevated recently She does monitor it at home and it typically is not this high She states it may be elevated due to increased stress.  She has had significant stress trying to care for her husband at home and she is trying to place him currently We will hold off on any changes for now She will continue to monitor her blood pressure at home and call if it is elevated

## 2017-07-23 NOTE — Patient Instructions (Signed)
Continue to monitor your BP at home.   Continue using the humidifier at night, using neosporin in your nostrils, afrin as needed and saline nasal spray.   Let me know if your nosebleeds return or if your rectal bleeding recurs.    No change in medications.

## 2017-07-23 NOTE — Assessment & Plan Note (Signed)
She had increased left upper quadrant pain when she went to the emergency room the first time.  CT of the abdomen and pelvis did not show any concerning signs This is something that has been intermittent for her since her surgery in 2016-partial colectomy done laparoscopically-pain is located at the site of the scar Work-up by surgery in the past showed no hernias or obvious causes Has intermittent flares

## 2017-07-23 NOTE — Assessment & Plan Note (Signed)
Increased headaches recently-likely related to stress, but she also has a history of migraines In the ED her headache resolved with IV fluids, Benadryl and Reglan No headache currently Working on improving her stress level

## 2017-07-23 NOTE — Assessment & Plan Note (Signed)
Has had recurrent epistaxis over the last week-right nostril only Has gone to the emergency room twice No active bleeding Symptoms better with using Afrin as needed, humidifier, applying Neosporin in the nostrils Recommended saline nasal spray as well She will let me know if the bleeding recurs and we can consider ENT referral for possible cauterization

## 2017-07-23 NOTE — Assessment & Plan Note (Signed)
Intermittent CT of the abdomen pelvis showed no acute findings History of diverticulosis Frequent bowel movements-chronic diarrhea controlled with daily Imodium Bleeding thought to be related to hemorrhoids per GI Bleeding has stopped If recurs will consider just seeing GI again

## 2017-07-25 ENCOUNTER — Encounter: Payer: Self-pay | Admitting: Cardiology

## 2017-07-29 ENCOUNTER — Inpatient Hospital Stay: Payer: Medicare Other | Admitting: Internal Medicine

## 2017-07-29 ENCOUNTER — Telehealth: Payer: Self-pay | Admitting: Emergency Medicine

## 2017-07-29 DIAGNOSIS — R040 Epistaxis: Secondary | ICD-10-CM

## 2017-07-29 NOTE — Telephone Encounter (Signed)
Referral ordered

## 2017-07-29 NOTE — Telephone Encounter (Signed)
Copied from [place] [phone]. Topic: General - Other >> Jul 29, 2017  8:17 AM Judyann Munson wrote: Reason for CRM: Patient is calling to request a referral for a Ears, nose, & throat doctor. Please advise  >> Jul 29, 2017  8:28 AM Morey Hummingbird wrote: Pt would like referral for nose bleeds. Has had 3 nosebleeds since appt.

## 2017-07-31 DIAGNOSIS — R040 Epistaxis: Secondary | ICD-10-CM | POA: Diagnosis not present

## 2017-08-02 ENCOUNTER — Encounter: Payer: Self-pay | Admitting: Cardiology

## 2017-08-02 ENCOUNTER — Ambulatory Visit (INDEPENDENT_AMBULATORY_CARE_PROVIDER_SITE_OTHER): Payer: Medicare Other | Admitting: Cardiology

## 2017-08-02 ENCOUNTER — Encounter

## 2017-08-02 VITALS — BP 150/82 | HR 70 | Ht 62.0 in | Wt 197.4 lb

## 2017-08-02 DIAGNOSIS — R51 Headache: Secondary | ICD-10-CM | POA: Diagnosis not present

## 2017-08-02 DIAGNOSIS — E782 Mixed hyperlipidemia: Secondary | ICD-10-CM | POA: Diagnosis not present

## 2017-08-02 DIAGNOSIS — I10 Essential (primary) hypertension: Secondary | ICD-10-CM

## 2017-08-02 DIAGNOSIS — I5033 Acute on chronic diastolic (congestive) heart failure: Secondary | ICD-10-CM

## 2017-08-02 DIAGNOSIS — I2510 Atherosclerotic heart disease of native coronary artery without angina pectoris: Secondary | ICD-10-CM | POA: Diagnosis not present

## 2017-08-02 DIAGNOSIS — R519 Headache, unspecified: Secondary | ICD-10-CM

## 2017-08-02 MED ORDER — NEBIVOLOL HCL 10 MG PO TABS: 10.0000 mg | ORAL_TABLET | Freq: Every day | ORAL | 1 refills | Status: DC

## 2017-08-02 MED ORDER — LISINOPRIL 40 MG PO TABS: 40.0000 mg | ORAL_TABLET | Freq: Every day | ORAL | 1 refills | Status: DC

## 2017-08-02 MED ORDER — ISOSORBIDE MONONITRATE ER 60 MG PO TB24: 60.0000 mg | ORAL_TABLET | Freq: Every day | ORAL | 1 refills | Status: DC

## 2017-08-02 MED ORDER — HYDROCHLOROTHIAZIDE 25 MG PO TABS: 25.0000 mg | ORAL_TABLET | Freq: Every day | ORAL | 1 refills | Status: DC
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
## 2017-08-02 NOTE — Patient Instructions (Signed)
Medication Instructions:  Your physician has recommended you make the following change in your medication:   1. STOP: furosemide (lasix)  2. START: hydrochlorothiazide 25 mg once daily  3. DECREASE: bystolic to 10 mg once a day  4. INCREASE: isosorbide mononitrate (imdur) to 60 mg once daily  5. INCREASE: lisinopril to 40 mg once daily  Labwork: None ordered  Testing/Procedures: None ordered  Follow-Up: Your physician recommends that you follow-up in the Hypertension Clinic for Blood Pressure Management on 08/16/17 at 10:30 AM.  Your physician recommends that you follow-up with [name] on 12/16/17 at 10:00 AM.    Any Other Special Instructions Will Be Listed Below (If Applicable).     If you need a refill on your cardiac medications before your next appointment, please call your pharmacy.

## 2017-08-05 DIAGNOSIS — R040 Epistaxis: Secondary | ICD-10-CM | POA: Diagnosis not present

## 2017-08-06 ENCOUNTER — Telehealth: Payer: Self-pay | Admitting: Emergency Medicine

## 2017-08-06 NOTE — Telephone Encounter (Signed)
Copied from [place] [id number]. Topic: Inquiry >> Aug 06, 2017  8:47 AM Conception Chancy, NT wrote: Reason for CRM: patient wanted to let Dr. Quay Burow know she had her nose redone yesterday with Dr. Lucia Gaskins.

## 2017-08-06 NOTE — Telephone Encounter (Signed)
noted 

## 2017-08-07 ENCOUNTER — Ambulatory Visit: Payer: Medicare Other

## 2017-08-12 NOTE — Progress Notes (Addendum)
Subjective:   Samantha [name] is a 77 y.o. female who presents for Medicare Annual (Subsequent) preventive examination.  Review of Systems:  No ROS.  Medicare Wellness Visit. Additional risk factors are reflected in the social history.  Cardiac Risk Factors include: advanced age (>60men, >51 women);diabetes mellitus;dyslipidemia;hypertension Sleep patterns: has interrupted sleep, gets up numerous times nightly to assist husband if needed and sleeps 5-6 hours nightly.    Home Safety/Smoke Alarms: Feels safe in home. Smoke alarms in place.  Living environment; residence and Firearm Safety: 1-story house/ trailer, equipment: Radio producer, Type: [place], no firearms. Lives with family, no needs for DME, good support system Seat Belt Safety/Bike Helmet: Wears seat belt.      Objective:     Vitals: BP 121/78   Pulse 64   Resp 19   Ht 5\' 2"  (1.575 m)   Wt 195 lb (88.5 kg)   SpO2 98%   BMI 35.67 kg/m   Body mass index is 35.67 kg/m.  Advanced Directives 08/13/2017 01/17/2016 11/11/2014 11/11/2014 11/03/2014 10/20/2014 10/19/2014  Does Patient Have a Medical Advance Directive? Yes Yes No - No No No  Type of Paramedic of [place];Living will [place];Living will - - - - -  Does patient want to make changes to medical advance directive? - - - - - - -  Copy of [place] in Chart? No - copy requested No - copy requested - - - - -  Would patient like information on creating a medical advance directive? - - - (No Data) Yes - Educational materials given Yes - Spiritual care consult ordered -  Pre-existing out of facility DNR order (yellow form or pink MOST form) - - - - - - -    Tobacco Social History   Tobacco Use  Smoking Status Former Smoker  . Packs/day: 0.25  . Years: 5.00  . Pack years: 1.25  . Types: Cigarettes  . Last attempt to quit: 01/29/1998  . Years since quitting: 19.5  Smokeless Tobacco Never Used    Tobacco Comment   Quit in 2001     Counseling given: Not Answered Comment: Quit in 2001  Past Medical History:  Diagnosis Date  . Abnormal CT of the chest 2008   last CT4-l 2009:  . No f/u suggested   . Allergy   . Asthma   . CAD (coronary artery disease)    a. Coronary CTA 10/16: Coronary Ca score 211, mod non-obstructive CAD with LM mild plaque (25-50%), mid LAD 50-69%. b. Neg nuc 06/2015.  . Cataract    BILATERAL-REMOVED  . Chronic diastolic CHF (congestive heart failure) ([facility])   . Collagen vascular disease ([place])    "arterial sclerosis" per pt  . Complication of anesthesia    trouble waking up  . Fibromyalgia   . GERD (gastroesophageal reflux disease)   . H/O hiatal hernia   . Heart murmur   . Hyperlipidemia   . Hypertension   . 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 Neuromuscular disorder ([facility])    FIBROMYALGIA  . Ocular migraine   . OSA (obstructive sleep apnea) 09/2007   dx w/ a sleep study, not on  CPAP  . Osteoarthritis   . Osteoporosis   . Pneumonia    "double" in 2004  . PONV (postoperative nausea and vomiting)   . Reactive airway disease 01/29/2002   dx of  pseudoasthma / vcd in 2005 and nl sprirometry History of dyspnea, 2011,  improved after several medications were changed around Question of COPD, disproved July 06, 2009 with nl pft's      . Rheumatoid factor positive   . Shingles 11/2009  . Sleep apnea   . Stroke ([place])   . TIA (transient ischemic attack)    x2 - on Plavix for this  . Torn rotator cuff    right worse than left, both are torn  . Tumor, 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  . Type II diabetes mellitus ([place])   . 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 Vaginal dysplasia    Past Surgical History:  Procedure Laterality Date  . ABDOMINAL HYSTERECTOMY  1980   NO oophorectomy per pt   . ANTERIOR CERVICAL DECOMP/DISCECTOMY FUSION  2001   C 3, C4 and C5 plate and screws  . BREAST  BIOPSY Right 1999  . BUNIONECTOMY Left ~ 1977  . CATARACT EXTRACTION W/ INTRAOCULAR LENS  IMPLANT, BILATERAL  2012  . COLON SURGERY  10/2014  . EYE SURGERY Bilateral    torq lens for cataracts  . LEFT HEART CATH AND CORONARY ANGIOGRAPHY N/A 03/22/2016   Procedure: Left Heart Cath and Coronary Angiography;  Surgeon: Belva Crome, MD;  Location: [facility];  Service: Cardiovascular;  Laterality: N/A;  . THYROIDECTOMY, PARTIAL  1960's  . VAGINAL MASS EXCISION  1994   "Laser surgery for vaginal cancer; followed by chemotherapy" (06/27/2012)   Family History  Problem Relation Age of Onset  . Heart disease Father   . Heart disease Mother   . Lung cancer Mother   . Allergies Sister   . Parkinsonism Sister        possible  . Asthma Sister   . Asthma Paternal Grandmother   . Stroke Paternal Grandmother   . Heart disease Unknown        paternal grandparents, maternal grandparents,   . Heart disease Brother   . Emphysema Brother   . Aneurysm Brother        x3  . Kidney failure Brother   . Diabetes Brother   . Diabetes Brother   . Stroke Brother   . Stroke Maternal Grandmother   . Breast cancer Neg Hx   . Colon cancer Neg Hx   . Heart attack Neg Hx    Social History   Socioeconomic History  . Marital status: Married    Spouse name: Ilona Sorrel  . Number of children: 2  . Years of education: masters  . Highest education level: Not on file  Occupational History  . Occupation: Retired, disable since 2000    Employer: RETIRED  Social Needs  . Financial resource strain: Not hard at all  . Food insecurity:    Worry: Never true    Inability: Never true  . Transportation needs:    Medical: No    Non-medical: No  Tobacco Use  . Smoking status: Former Smoker    Packs/day: 0.25    Years: 5.00    Pack years: 1.25    Types: Cigarettes    Last attempt to quit: 01/29/1998    Years since quitting: 19.5  . Smokeless tobacco: Never Used  . Tobacco comment: Quit in 2001    Substance and Sexual Activity  . Alcohol use: No    Alcohol/week: 0.0 oz  . Drug use: No  . Sexual activity: Never  Lifestyle  . Physical activity:  Days per week: 0 days    Minutes per session: 0 min  . Stress: Very much  Relationships  . Social connections:    Talks on phone: More than three times a week    Gets together: More than three times a week    Attends religious service: More than 4 times per year    Active member of club or organization: Yes    Attends meetings of clubs or organizations: More than 4 times per year    Relationship status: Married  Other Topics Concern  . Not on file  Social History Narrative   On disability since 2000--- also husband has MS   Education. College.   Right handed.    Outpatient Encounter Medications as of 08/13/2017  Medication Sig  . acetaminophen (TYLENOL) 500 MG tablet Take 1,000 mg by mouth every 6 (six) hours as needed for moderate pain.  [name] albuterol (PROVENTIL HFA;VENTOLIN HFA) 108 (90 Base) MCG/ACT inhaler Inhale 2 puffs into the lungs every 6 (six) hours as needed for wheezing or shortness of breath.  [name] atorvastatin (LIPITOR) 20 MG tablet TAKE 1 TABLET (20 MG TOTAL) BY MOUTH DAILY.  . budesonide-formoterol (SYMBICORT) 80-4.5 MCG/ACT inhaler Inhale 2 puffs into the lungs 2 (two) times daily.  . butalbital-acetaminophen-caffeine (FIORICET) 50-325-40 MG tablet Take 1 tablet by mouth every 6 (six) hours as needed for headache or migraine.  [name] CALCIUM PO Take 1 tablet by mouth every morning.   . cetirizine (ZYRTEC) 10 MG tablet Take 1 tablet (10 mg total) by mouth daily.  . cholecalciferol (VITAMIN D) 1000 UNITS tablet Take 1,000 Units by mouth every morning.   . clopidogrel (PLAVIX) 75 MG tablet TAKE 1 TABLET(75 MG) BY MOUTH DAILY  . hydrochlorothiazide (HYDRODIURIL) 25 MG tablet Take 1 tablet (25 mg total) by mouth daily.  . hydrocortisone 2.5 % cream Apply topically 2 (two) times daily. (Patient taking differently: Apply 1  application topically 2 (two) times daily as needed (for rash). )  . isosorbide mononitrate (IMDUR) 60 MG 24 hr tablet Take 1 tablet (60 mg total) by mouth daily.  [name] lisinopril (PRINIVIL,ZESTRIL) 40 MG tablet Take 1 tablet (40 mg total) by mouth daily.  [name] LYRICA 100 MG capsule TAKE ONE CAPSULE BY MOUTH EVERY DAY AS NEEDED FOR PAIN (Patient taking differently: TAKE 1 CAPSULE (100 MG TOTALLY) BY MOUTH EVERY DAY AS NEEDED FOR PAIN)  . metFORMIN (GLUCOPHAGE-XR) 500 MG 24 hr tablet TAKE 1 TABLET(500 MG) BY MOUTH DAILY WITH BREAKFAST  . Multiple Vitamin (MULTIVITAMIN) capsule Take 1 capsule by mouth daily.    . nebivolol (BYSTOLIC) 10 MG tablet Take 1 tablet (10 mg total) by mouth daily.  . nitroGLYCERIN (NITROSTAT) 0.4 MG SL tablet Place 0.4 mg under the tongue every 5 (five) minutes as needed for chest pain (x 3 doses).  Vladimir Faster Glycol-Propyl Glycol (SYSTANE) 0.4-0.3 % GEL Place 1 drop into both eyes daily as needed (dry eyes).   . Potassium 99 MG TABS Take 99 mg by mouth daily.   . promethazine (PHENERGAN) 25 MG tablet Take 1 tablet (25 mg total) by mouth every 8 (eight) hours as needed for nausea or vomiting.  . ranitidine (ZANTAC) 300 MG tablet TAKE 1 TABLET(300 MG) BY MOUTH AT BEDTIME  . [DISCONTINUED] cholestyramine (QUESTRAN) 4 g packet Take 4 g by mouth every other day.   No facility-administered encounter medications on file as of 08/13/2017.     Activities of Daily Living In your present state of health, do  you have any difficulty performing the following activities: 08/13/2017 10/19/2016  Hearing? N N  Vision? N N  Difficulty concentrating or making decisions? N N  Walking or climbing stairs? N Y  Dressing or bathing? N N  Doing errands, shopping? N N  Preparing Food and eating ? N N  Using the Toilet? N N  In the past six months, have you accidently leaked urine? N N  Do you have problems with loss of bowel control? N N  Managing your Medications? N N  Managing your Finances? N  N  Housekeeping or managing your Housekeeping? N N  Some recent data might be hidden    Patient Care Team: [name], MD as PCP - General (Internal Medicine) [name], MD as Consulting Physician (Ophthalmology) [name], MD as Consulting Physician (Cardiology) [name], MD as Consulting Physician (Obstetrics and Gynecology) [name], MD as Consulting Physician (General Surgery) Zadie Rhine Clent Demark, MD as Consulting Physician (Ophthalmology) Marybelle Killings, MD as Consulting Physician (Orthopedic Surgery)    Assessment:   This is a routine wellness examination for [place]. Physical assessment deferred to PCP.   Exercise Activities and Dietary recommendations Current Exercise Habits: The patient does not participate in regular exercise at present  Diet (meal preparation, eat out, water intake, caffeinated beverages, dairy products, fruits and vegetables): in general, a "healthy" diet  , on average, 2 meals per day, has difficulty finding time to eat and prepare meals.   Reviewed heart healthy and diabetic diet, Encouraged patient to increase daily water and fluid intake. Discussed supplementing with glucerna (samples and coupons provided)    Goals    . Patient Stated     Take time for myself as much as I can. Go to a room and take deep breaths if I cannot get out of the house. When my daughter is home take time to leave the house to clear my head and meditate. Continue to work on getting all of the resources I can to help with respite care for my husband. I will set out 3-4 bottles of water and drink them daily. I will drink glucerna if I do not have time to eat or prepare a meal.         Fall Risk Fall Risk  08/13/2017 10/19/2016 10/19/2015 09/17/2014 05/19/2014  Falls in the past year? No No No No No    Depression Screen PHQ 2/9 Scores 08/13/2017 10/19/2016 10/19/2015 09/17/2014  PHQ - 2 Score 2 0 0 0  PHQ- 9 Score 6 2 - -     Cognitive Function MMSE -  Mini Mental State Exam 08/13/2017 10/19/2016  Orientation to time 5 5  Orientation to Place 5 5  Registration 3 3  Attention/ Calculation 5 5  Recall 1 2  Language- name 2 objects 2 2  Language- repeat 1 1  Language- follow 3 step command 3 3  Language- read & follow direction 1 1  Write a sentence 1 1  Copy design 1 1  Total score 28 29        Immunization History  Administered Date(s) Administered  . Influenza Split 11/30/2010  . Influenza Whole 10/20/2008, 01/02/2010  . Influenza, High Dose Seasonal PF 12/24/2012, 03/17/2015, 10/19/2015, 10/19/2016  . Influenza, Seasonal, Injecte, Preservative Fre 01/01/2012  . Influenza,inj,Quad PF,6+ Mos 11/13/2013  . Pneumococcal Conjugate-13 05/19/2014  . Pneumococcal Polysaccharide-23 01/29/2002, 11/23/2008  . Td 01/29/2001  . Tdap 08/14/2011  . Zoster 12/24/2012   Screening Tests Health  Maintenance  Topic Date Due  . OPHTHALMOLOGY EXAM  03/13/2017  . INFLUENZA VACCINE  08/29/2017  . FOOT EXAM  10/19/2017  . HEMOGLOBIN A1C  10/24/2017  . COLONOSCOPY  02/01/2019  . DEXA SCAN  06/11/2021  . TETANUS/TDAP  08/13/2021  . PNA vac Low Risk Adult  Completed       Plan:     Just 1 Navigator resource provided to assist patient to locate nursing home placement for her husband. Caregiver support group resource provided.  Continue doing brain stimulating activities (puzzles, reading, adult coloring books, staying active) to keep memory sharp.   Continue to eat heart healthy diet (full of fruits, vegetables, whole grains, lean protein, water--limit salt, fat, and sugar intake) and increase physical activity as tolerated.  I have personally reviewed and noted the following in the patient's chart:   . Medical and social history . Use of alcohol, tobacco or illicit drugs  . Current medications and supplements . Functional ability and status . Nutritional status . Physical activity . Advanced directives . List of other  physicians . Vitals . Screenings to include cognitive, depression, and falls . Referrals and appointments  In addition, I have reviewed and discussed with patient certain preventive protocols, quality metrics, and best practice recommendations. A written personalized care plan for preventive services as well as general preventive health recommendations were provided to patient.     Michiel Cowboy, RN  08/13/2017    Medical screening examination/treatment/procedure(s) were performed by non-physician practitioner and as supervising physician I was immediately available for consultation/collaboration. I agree with above. [name], MD

## 2017-08-13 ENCOUNTER — Ambulatory Visit (INDEPENDENT_AMBULATORY_CARE_PROVIDER_SITE_OTHER): Payer: Medicare Other | Admitting: *Deleted

## 2017-08-13 VITALS — BP 121/78 | HR 64 | Resp 19 | Ht 62.0 in | Wt 195.0 lb

## 2017-08-13 DIAGNOSIS — Z0000 Encounter for general adult medical examination without abnormal findings: Secondary | ICD-10-CM | POA: Diagnosis not present

## 2017-08-13 NOTE — Patient Instructions (Addendum)
Just1Navigator Free way to craft your ideal care program. Just1Navigator [phone] [facility]: [place], [place]  [employer]igator - [facility] in [place], [place]   [email or web address]  [facility] in [place], [place] Address: [address], [place], [place] [address] Phone: [phone]  Continue doing brain stimulating activities (puzzles, reading, adult coloring books, staying active) to keep memory sharp.   Continue to eat heart healthy diet (full of fruits, vegetables, whole grains, lean protein, water--limit salt, fat, and sugar intake) and increase physical activity as tolerated.   Samantha [name] , Thank you for taking time to come for your Medicare Wellness Visit. I appreciate your ongoing commitment to your health goals. Please review the following plan we discussed and let me know if I can assist you in the future.   These are the goals we discussed: Goals    . Patient Stated     Take time for myself as much as I can. Go to a room and take deep breaths if I cannot get out of the house. When my daughter is home take time to leave the house to clear my head and meditate. Continue to work on getting all of the resources I can to help with respite care for my husband. I will set out 3-4 bottles of water and drink them daily. I will drink glucerna if I do not have time to eat or prepare a meal.         This is a list of the screening recommended for you and due dates:  Health Maintenance  Topic Date Due  . Eye exam for diabetics  03/13/2017  . Flu Shot  08/29/2017  . Complete foot exam   10/19/2017  . Hemoglobin A1C  10/24/2017  . Colon Cancer Screening  02/01/2019  . DEXA scan (bone density measurement)  06/11/2021  . Tetanus Vaccine  08/13/2021  . Pneumonia vaccines  Completed   Health Maintenance, Female Adopting a healthy lifestyle and getting preventive  care can go a long way to promote health and wellness. Talk with your health care provider about what schedule of regular examinations is right for you. This is a good chance for you to check in with your provider about disease prevention and staying healthy. In between checkups, there are plenty of things you can do on your own. Experts have done a lot of research about which lifestyle changes and preventive measures are most likely to keep you healthy. Ask your health care provider for more information. Weight and diet Eat a healthy diet  Be sure to include plenty of vegetables, fruits, low-fat dairy products, and lean protein.  Do not eat a lot of foods high in solid fats, added sugars, or salt.  Get regular exercise. This is one of the most important things you can do for your health. ? Most adults should exercise for at least 150 minutes each week. The exercise should increase your heart rate and make you sweat (moderate-intensity exercise). ? Most adults should also do strengthening exercises at least twice a week. This is in addition to the moderate-intensity exercise.  Maintain a healthy weight  Body mass index (BMI) is a measurement that can be used to identify possible weight problems. It estimates body fat based on height and weight. Your health care provider can help determine your BMI and help you achieve or maintain a healthy weight.  For females 90 years of age and older: ? A BMI below 18.5 is  considered underweight. ? A BMI of 18.5 to 24.9 is normal. ? A BMI of 25 to 29.9 is considered overweight. ? A BMI of 30 and above is considered obese.  Watch levels of cholesterol and blood lipids  You should start having your blood tested for lipids and cholesterol at 75 years of age, then have this test every 5 years.  You may need to have your cholesterol levels checked more often if: ? Your lipid or cholesterol levels are high. ? You are older than 75 years of age. ? You are at  high risk for heart disease.  Cancer screening Lung Cancer  Lung cancer screening is recommended for adults 57-7 years old who are at high risk for lung cancer because of a history of smoking.  A yearly low-dose CT scan of the lungs is recommended for people who: ? Currently smoke. ? Have quit within the past 15 years. ? Have at least a 30-pack-year history of smoking. A pack year is smoking an average of one pack of cigarettes a day for 1 year.  Yearly screening should continue until it has been 15 years since you quit.  Yearly screening should stop if you develop a health problem that would prevent you from having lung cancer treatment.  Breast Cancer  Practice breast self-awareness. This means understanding how your breasts normally appear and feel.  It also means doing regular breast self-exams. Let your health care provider know about any changes, no matter how small.  If you are in your 20s or 30s, you should have a clinical breast exam (CBE) by a health care provider every 1-3 years as part of a regular health exam.  If you are 46 or older, have a CBE every year. Also consider having a breast X-ray (mammogram) every year.  If you have a family history of breast cancer, talk to your health care provider about genetic screening.  If you are at high risk for breast cancer, talk to your health care provider about having an MRI and a mammogram every year.  Breast cancer gene (BRCA) assessment is recommended for women who have family members with BRCA-related cancers. BRCA-related cancers include: ? Breast. ? Ovarian. ? Tubal. ? Peritoneal cancers.  Results of the assessment will determine the need for genetic counseling and BRCA1 and BRCA2 testing.  Cervical Cancer Your health care provider may recommend that you be screened regularly for cancer of the pelvic organs (ovaries, uterus, and vagina). This screening involves a pelvic examination, including checking for  microscopic changes to the surface of your cervix (Pap test). You may be encouraged to have this screening done every 3 years, beginning at age 23.  For women ages 54-65, health care providers may recommend pelvic exams and Pap testing every 3 years, or they may recommend the Pap and pelvic exam, combined with testing for human papilloma virus (HPV), every 5 years. Some types of HPV increase your risk of cervical cancer. Testing for HPV may also be done on women of any age with unclear Pap test results.  Other health care providers may not recommend any screening for nonpregnant women who are considered low risk for pelvic cancer and who do not have symptoms. Ask your health care provider if a screening pelvic exam is right for you.  If you have had past treatment for cervical cancer or a condition that could lead to cancer, you need Pap tests and screening for cancer for at least 20 years after your treatment. If  Pap tests have been discontinued, your risk factors (such as having a new sexual partner) need to be reassessed to determine if screening should resume. Some women have medical problems that increase the chance of getting cervical cancer. In these cases, your health care provider may recommend more frequent screening and Pap tests.  Colorectal Cancer  This type of cancer can be detected and often prevented.  Routine colorectal cancer screening usually begins at 75 years of age and continues through 75 years of age.  Your health care provider may recommend screening at an earlier age if you have risk factors for colon cancer.  Your health care provider may also recommend using home test kits to check for hidden blood in the stool.  A small camera at the end of a tube can be used to examine your colon directly (sigmoidoscopy or colonoscopy). This is done to check for the earliest forms of colorectal cancer.  Routine screening usually begins at age 48.  Direct examination of the colon  should be repeated every 5-10 years through 75 years of age. However, you may need to be screened more often if early forms of precancerous polyps or small growths are found.  Skin Cancer  Check your skin from head to toe regularly.  Tell your health care provider about any new moles or changes in moles, especially if there is a change in a mole's shape or color.  Also tell your health care provider if you have a mole that is larger than the size of a pencil eraser.  Always use sunscreen. Apply sunscreen liberally and repeatedly throughout the day.  Protect yourself by wearing long sleeves, pants, a wide-brimmed hat, and sunglasses whenever you are outside.  Heart disease, diabetes, and high blood pressure  High blood pressure causes heart disease and increases the risk of stroke. High blood pressure is more likely to develop in: ? People who have blood pressure in the high end of the normal range (130-139/85-89 mm Hg). ? People who are overweight or obese. ? People who are African American.  If you are 32-43 years of age, have your blood pressure checked every 3-5 years. If you are 31 years of age or older, have your blood pressure checked every year. You should have your blood pressure measured twice-once when you are at a hospital or clinic, and once when you are not at a hospital or clinic. Record the average of the two measurements. To check your blood pressure when you are not at a hospital or clinic, you can use: ? An automated blood pressure machine at a pharmacy. ? A home blood pressure monitor.  If you are between 49 years and 65 years old, ask your health care provider if you should take aspirin to prevent strokes.  Have regular diabetes screenings. This involves taking a blood sample to check your fasting blood sugar level. ? If you are at a normal weight and have a low risk for diabetes, have this test once every three years after 75 years of age. ? If you are overweight and  have a high risk for diabetes, consider being tested at a younger age or more often. Preventing infection Hepatitis B  If you have a higher risk for hepatitis B, you should be screened for this virus. You are considered at high risk for hepatitis B if: ? You were born in a country where hepatitis B is common. Ask your health care provider which countries are considered high risk. ? Your  parents were born in a high-risk country, and you have not been immunized against hepatitis B (hepatitis B vaccine). ? You have HIV or AIDS. ? You use needles to inject street drugs. ? You live with someone who has hepatitis B. ? You have had sex with someone who has hepatitis B. ? You get hemodialysis treatment. ? You take certain medicines for conditions, including cancer, organ transplantation, and autoimmune conditions.  Hepatitis C  Blood testing is recommended for: ? Everyone born from [birth year] through 1963. ? Anyone with known risk factors for hepatitis C.  Sexually transmitted infections (STIs)  You should be screened for sexually transmitted infections (STIs) including gonorrhea and chlamydia if: ? You are sexually active and are younger than 75 years of age. ? You are older than 76 years of age and your health care provider tells you that you are at risk for this type of infection. ? Your sexual activity has changed since you were last screened and you are at an increased risk for chlamydia or gonorrhea. Ask your health care provider if you are at risk.  If you do not have HIV, but are at risk, it may be recommended that you take a prescription medicine daily to prevent HIV infection. This is called pre-exposure prophylaxis (PrEP). You are considered at risk if: ? You are sexually active and do not regularly use condoms or know the HIV status of your partner(s). ? You take drugs by injection. ? You are sexually active with a partner who has HIV.  Talk with your health care provider about whether  you are at high risk of being infected with HIV. If you choose to begin PrEP, you should first be tested for HIV. You should then be tested every 3 months for as long as you are taking PrEP. Pregnancy  If you are premenopausal and you may become pregnant, ask your health care provider about preconception counseling.  If you may become pregnant, take 400 to 800 micrograms (mcg) of folic acid every day.  If you want to prevent pregnancy, talk to your health care provider about birth control (contraception). Osteoporosis and menopause  Osteoporosis is a disease in which the bones lose minerals and strength with aging. This can result in serious bone fractures. Your risk for osteoporosis can be identified using a bone density scan.  If you are 47 years of age or older, or if you are at risk for osteoporosis and fractures, ask your health care provider if you should be screened.  Ask your health care provider whether you should take a calcium or vitamin D supplement to lower your risk for osteoporosis.  Menopause may have certain physical symptoms and risks.  Hormone replacement therapy may reduce some of these symptoms and risks. Talk to your health care provider about whether hormone replacement therapy is right for you. Follow these instructions at home:  Schedule regular health, dental, and eye exams.  Stay current with your immunizations.  Do not use any tobacco products including cigarettes, chewing tobacco, or electronic cigarettes.  If you are pregnant, do not drink alcohol.  If you are breastfeeding, limit how much and how often you drink alcohol.  Limit alcohol intake to no more than 1 drink per day for nonpregnant women. One drink equals 12 ounces of beer, 5 ounces of wine, or 1 ounces of hard liquor.  Do not use street drugs.  Do not share needles.  Ask your health care provider for help if you need support  or information about quitting drugs.  Tell your health care  provider if you often feel depressed.  Tell your health care provider if you have ever been abused or do not feel safe at home. This information is not intended to replace advice given to you by your health care provider. Make sure you discuss any questions you have with your health care provider. Document Released: 07/31/2010 Document Revised: 06/23/2015 Document Reviewed: 10/19/2014 Elsevier Interactive Patient Education  [employer].

## 2017-08-16 ENCOUNTER — Ambulatory Visit (INDEPENDENT_AMBULATORY_CARE_PROVIDER_SITE_OTHER): Payer: Medicare Other | Admitting: Pharmacist

## 2017-08-16 ENCOUNTER — Encounter: Payer: Self-pay | Admitting: Pharmacist

## 2017-08-16 VITALS — BP 146/78 | HR 70

## 2017-08-16 DIAGNOSIS — I10 Essential (primary) hypertension: Secondary | ICD-10-CM

## 2017-08-16 DIAGNOSIS — I2510 Atherosclerotic heart disease of native coronary artery without angina pectoris: Secondary | ICD-10-CM

## 2017-08-16 LAB — BASIC METABOLIC PANEL
BUN/Creatinine Ratio: 19 (ref 12–28)
BUN: 18 mg/dL (ref 8–27)
CO2: 23 mmol/L (ref 20–29)
Calcium: 9.3 mg/dL (ref 8.7–10.3)
Chloride: 101 mmol/L (ref 96–106)
Creatinine, Ser: 0.96 mg/dL (ref 0.57–1.00)
GFR calc Af Amer: 67 mL/min/{1.73_m2} (ref >59)
GFR calc non Af Amer: 58 mL/min/{1.73_m2} — ABNORMAL LOW (ref >59)
Glucose: 118 mg/dL — ABNORMAL HIGH (ref 65–99)
Potassium: 4.3 mmol/L (ref 3.5–5.2)
Sodium: 140 mmol/L (ref 134–144)

## 2017-08-16 MED ORDER — HYDROCHLOROTHIAZIDE 25 MG PO TABS: 12.5000 mg | ORAL_TABLET | Freq: Every day | ORAL | 1 refills | Status: DC

## 2017-08-16 MED ORDER — ISOSORBIDE MONONITRATE ER 30 MG PO TB24: 30.0000 mg | ORAL_TABLET | Freq: Every day | ORAL | 1 refills | Status: DC

## 2017-08-16 NOTE — Patient Instructions (Signed)
BMET today.   Return for a a follow up appointment in 3-4 weeks  Check your blood pressure at home daily (if able) and keep record of the readings.  Take your BP meds as follows: DECREASE Imdur (isosorbide) to 30mg  daily DECREASE hydrochlorothiazide to 12.5mg  daily   Bring all of your meds, your BP cuff and your record of home blood pressures to your next appointment.  Exercise as you're able, try to walk approximately 30 minutes per day.  Keep salt intake to a minimum, especially watch canned and prepared boxed foods.  Eat more fresh fruits and vegetables and fewer canned items.  Avoid eating in fast food restaurants.    HOW TO TAKE YOUR BLOOD PRESSURE: . Rest 5 minutes before taking your blood pressure. .  Don't smoke or drink caffeinated beverages for at least 30 minutes before. . Take your blood pressure before (not after) you eat. . Sit comfortably with your back supported and both feet on the floor (don't cross your legs). . Elevate your arm to heart level on a table or a desk. . Use the proper sized cuff. It should fit smoothly and snugly around your bare upper arm. There should be enough room to slip a fingertip under the cuff. The bottom edge of the cuff should be 1 inch above the crease of the elbow. . Ideally, take 3 measurements at one sitting and record the average.

## 2017-08-16 NOTE — Progress Notes (Signed)
Patient ID: Samantha [name]                 DOB: [birth year]                      MRN: [id number]     HPI: Samantha [name] is a 77 y.o. female patient of [name] who presents today for hypertension evaluation. PMH significant for CAD (moderate nonobstructive disease by cardiac CT 10/2014), TIA (on Plavix), chronic diastolic CHF, DM2, HTN, HL, fibromyalgia, palpitations secondary to symptomatic PVCs, LE edema, colon cancer (s/p lap partial colectomy 10/2014), reactive airway disease. At her most recent OV several medication changes were made. Her Bystolic was reduced and her lisinopril was increased. Her furosemide was also changed to HCTZ. She has since called and her HCTZ was reduced to 12.5mg  until today's visit.   She presents today for blood pressure check. She reports that she has been dizzy and lethargic recently. Her pressure has been running on the low side.    Current HTN meds:  HCTZ 12.5mg  daily since 2 days ago Imdur 60mg  daily  Lisinopril 40mg  daily nebivolol 10mg  daily   Previously tried:   BP goal: <130/80  Family History: Allergies in her sister; Aneurysm in her brother; Asthma in her paternal grandmother and sister; Diabetes in her brother and brother; Emphysema in her brother; Heart disease in her brother, father, mother, and unknown relative; Kidney failure in her brother; Lung cancer in her mother; Parkinsonism in her sister; Stroke in her brother, maternal grandmother, and paternal grandmother  Social History: former smoker, denies alcohol.   Home BP readings: 85-110s/60-70s  Wt Readings from Last 3 Encounters:  08/13/17 195 lb (88.5 kg)  08/02/17 197 lb 6.4 oz (89.5 kg)  07/22/17 197 lb (89.4 kg)   BP Readings from Last 3 Encounters:  08/16/17 (!) 146/78  08/13/17 121/78  08/02/17 (!) 150/82   Pulse Readings from Last 3 Encounters:  08/16/17 70  08/13/17 64  08/02/17 70    Renal function: Estimated Creatinine Clearance: 52.4 mL/min (by C-G  formula based on SCr of 0.96 mg/dL).  Past Medical History:  Diagnosis Date  . Abnormal CT of the chest 2008   last CT4-l 2009:  . No f/u suggested   . Allergy   . Asthma   . CAD (coronary artery disease)    a. Coronary CTA 10/16: Coronary Ca score 211, mod non-obstructive CAD with LM mild plaque (25-50%), mid LAD 50-69%. b. Neg nuc 06/2015.  . Cataract    BILATERAL-REMOVED  . Chronic diastolic CHF (congestive heart failure) ([facility])   . Collagen vascular disease ([facility])    "arterial sclerosis" per pt  . Complication of anesthesia    trouble waking up  . Fibromyalgia   . GERD (gastroesophageal reflux disease)   . H/O hiatal hernia   . Heart murmur   . Hyperlipidemia   . Hypertension   . Malignant neoplasm of ascending colon ([facility]) 2016   Minimally invasive right hemicolectomy to be done   . Neuromuscular disorder ([facility])    FIBROMYALGIA  . Ocular migraine   . OSA (obstructive sleep apnea) 09/2007   dx w/ a sleep study, not on  CPAP  . Osteoarthritis   . Osteoporosis   . Pneumonia    "double" in 2004  . PONV (postoperative nausea and vomiting)   . Reactive airway disease 01/29/2002   dx of pseudoasthma / vcd in 2005 and nl sprirometry History of dyspnea, 2011,  improved after several medications were changed around Question of COPD, disproved July 06, 2009 with nl pft's      . Rheumatoid factor positive   . Shingles 11/2009  . Sleep apnea   . Stroke ([facility])   . TIA (transient ischemic attack)    x2 - on Plavix for this  . Torn rotator cuff    right worse than left, both are torn  . Tumor, 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  . Type II diabetes mellitus ([place])   . 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 Vaginal dysplasia     Current Outpatient Medications on File Prior to Visit  Medication Sig Dispense Refill  . acetaminophen (TYLENOL) 500 MG tablet Take 1,000 mg by mouth every 6 (six) hours as needed for moderate pain.    [name] albuterol (PROVENTIL HFA;VENTOLIN HFA) 108 (90 Base)  MCG/ACT inhaler Inhale 2 puffs into the lungs every 6 (six) hours as needed for wheezing or shortness of breath. 18 g 3  . atorvastatin (LIPITOR) 20 MG tablet TAKE 1 TABLET (20 MG TOTAL) BY MOUTH DAILY. 90 tablet 1  . budesonide-formoterol (SYMBICORT) 80-4.5 MCG/ACT inhaler Inhale 2 puffs into the lungs 2 (two) times daily. 1 Inhaler 3  . butalbital-acetaminophen-caffeine (FIORICET) 50-325-40 MG tablet Take 1 tablet by mouth every 6 (six) hours as needed for headache or migraine. 30 tablet 5  . CALCIUM PO Take 1 tablet by mouth every morning.     . cetirizine (ZYRTEC) 10 MG tablet Take 1 tablet (10 mg total) by mouth daily. 90 tablet 1  . cholecalciferol (VITAMIN D) 1000 UNITS tablet Take 1,000 Units by mouth every morning.     . clopidogrel (PLAVIX) 75 MG tablet TAKE 1 TABLET(75 MG) BY MOUTH DAILY 90 tablet 1  . hydrocortisone 2.5 % cream Apply topically 2 (two) times daily. (Patient taking differently: Apply 1 application topically 2 (two) times daily as needed (for rash). ) 30 g 0  . lisinopril (PRINIVIL,ZESTRIL) 40 MG tablet Take 1 tablet (40 mg total) by mouth daily. 90 tablet 1  . LYRICA 100 MG capsule TAKE ONE CAPSULE BY MOUTH EVERY DAY AS NEEDED FOR PAIN (Patient taking differently: TAKE 1 CAPSULE (100 MG TOTALLY) BY MOUTH EVERY DAY AS NEEDED FOR PAIN) 30 capsule 2  . metFORMIN (GLUCOPHAGE-XR) 500 MG 24 hr tablet TAKE 1 TABLET(500 MG) BY MOUTH DAILY WITH BREAKFAST 90 tablet 1  . Multiple Vitamin (MULTIVITAMIN) capsule Take 1 capsule by mouth daily.      . nebivolol (BYSTOLIC) 10 MG tablet Take 1 tablet (10 mg total) by mouth daily. 90 tablet 1  . nitroGLYCERIN (NITROSTAT) 0.4 MG SL tablet Place 0.4 mg under the tongue every 5 (five) minutes as needed for chest pain (x 3 doses).    Vladimir Faster Glycol-Propyl Glycol (SYSTANE) 0.4-0.3 % GEL Place 1 drop into both eyes daily as needed (dry eyes).     . Potassium 99 MG TABS Take 99 mg by mouth daily.     . promethazine (PHENERGAN) 25 MG tablet  Take 1 tablet (25 mg total) by mouth every 8 (eight) hours as needed for nausea or vomiting. 20 tablet 0  . ranitidine (ZANTAC) 300 MG tablet TAKE 1 TABLET(300 MG) BY MOUTH AT BEDTIME 90 tablet 1   No current facility-administered medications on file prior to visit.     Allergies  Allergen Reactions  . Bactrim [Sulfamethoxazole-Trimethoprim] Other (See Comments)    See OV 09-15-13, rash-tongue swelling due to bactrim ?  [name]  Cefuroxime Axetil     REACTION: urticaria (hives)  . Oxycodone Nausea And Vomiting  . Pravastatin Other (See Comments)    "muscle breakdown" with profuse sweating  . Seldane [Terfenadine]     REACTION: urticaria (hives)  . Zocor [Simvastatin]     2012 "Muscle breakdown " with profuse sweating  . Tramadol Other (See Comments)    REACTION: vomitting  . Lime Flavor [Flavoring Agent] Diarrhea  . Tape Other (See Comments)    blisters    Blood pressure (!) 146/78, pulse 70.   Assessment/Plan: Hypertension: BMET today. BP today is elevated above goal, but measures appropriately with home cuff. Home measurements have been low and she has been experiencing dizziness. Will continue HCTZ at lower dose of 12.5mg  daily and reduce isosorbide to 30mg  daily as well. Consider decrease lisinopril if pressures remain on low side at next visit. Advised to continue to monitor and follow up in HTN clinic in 3-4 weeks.    Thank you, Lelan Pons. Patterson Hammersmith, [place]  08/20/2017 11:24 AM  ADDENDUM: BMET WNL. Continue as above.

## 2017-08-21 ENCOUNTER — Telehealth: Payer: Self-pay | Admitting: Emergency Medicine

## 2017-08-21 NOTE — Telephone Encounter (Signed)
NOTED

## 2017-08-21 NOTE — Telephone Encounter (Signed)
Copied from [facility] [id number]. Topic: Inquiry >> Aug 20, 2017  3:59 PM Mylinda Latina, NT wrote: Reason for CRM: Patient called and states that someone may be contacting the office for " DNA" testing for the patient .  She states the company is out of [place]. Patient just wanted to let the provider know and that she is aware of it .   >> Aug 20, 2017  4:13 PM Morey Hummingbird wrote: LVM for more info on this.  Do not understand message.  >> Aug 20, 2017  4:35 PM Morey Hummingbird wrote: Pt received a call from the Genetic testing group based in [place]. They asked her to send a mouth swab and they would be doing test for Cancer screening test and Prescription screening test and Mail the results to [place] in [id number] Verification Number for test [id number]   I spoke to the patient and did some research. This was recently listed as a scam and fraud.  The patient did give out her Medicare number and she will contact them and let them know I also have her the Health fraud hotline its a government program [employer]   [phone] to call and report this as well.  If we receive anything from this company please disregard it.

## 2017-08-27 ENCOUNTER — Other Ambulatory Visit: Payer: Self-pay | Admitting: Internal Medicine

## 2017-08-27 DIAGNOSIS — I2510 Atherosclerotic heart disease of native coronary artery without angina pectoris: Secondary | ICD-10-CM

## 2017-08-27 DIAGNOSIS — I10 Essential (primary) hypertension: Secondary | ICD-10-CM

## 2017-08-27 DIAGNOSIS — R42 Dizziness and giddiness: Secondary | ICD-10-CM

## 2017-08-27 DIAGNOSIS — I5032 Chronic diastolic (congestive) heart failure: Secondary | ICD-10-CM

## 2017-08-27 DIAGNOSIS — R5383 Other fatigue: Secondary | ICD-10-CM

## 2017-08-27 DIAGNOSIS — R0602 Shortness of breath: Secondary | ICD-10-CM

## 2017-08-28 NOTE — Telephone Encounter (Signed)
[place] Controlled Substance Database checked. Last filled on 06/29/17

## 2017-09-11 ENCOUNTER — Ambulatory Visit (INDEPENDENT_AMBULATORY_CARE_PROVIDER_SITE_OTHER): Payer: Medicare Other

## 2017-09-11 VITALS — BP 102/58 | HR 69

## 2017-09-11 DIAGNOSIS — I2510 Atherosclerotic heart disease of native coronary artery without angina pectoris: Secondary | ICD-10-CM

## 2017-09-11 DIAGNOSIS — I10 Essential (primary) hypertension: Secondary | ICD-10-CM | POA: Diagnosis not present

## 2017-09-11 NOTE — Progress Notes (Signed)
Patient ID: Samantha [name]                 DOB: 11-13-1940                      MRN: [id number]     HPI: Samantha [name] is a 77 y.o. female patient of [name] to HTN clinic. PMH significant for CAD (moderate nonobstructive disease by cardiac CT 10/2014), TIA (on Plavix), chronic diastolic CHF, DM2, HTN, HL, fibromyalgia, palpitations secondary to symptomatic PVCs, LE edema, colon cancer (s/p lap partial colectomy 10/2014), reactive airway disease. In July her Bystolic was reduced, her lisinopril was increased, and her furosemide was changed to HCTZ 25mg  then reduced to 12.5mg . Imdur was drecreased to 30mg  at HTN clinic the end of July due to low home blood pressures and patient experiencing dizziness. She returned to clinic for follow up.   Patient reports today in good spirits. She denies dizzy, lethargic, lightheadedness, or loss of balance. She also denies chest pain. Her home reading have been mostly at goal with a range of 103-148/64-26mmHg. She reports maintaining a low sodium diet. She has increased her exercise and uses "Sit and be Fit" TV program to exercise with.   Current HTN meds:  HCTZ 12.5mg  daily  Imdur 30mg  daily  Lisinopril 40mg  daily Nebivolol 10mg  daily   BP goal: <130/54mmHg  Family History: Allergies in her sister; Aneurysm in her brother; Asthma in her paternal grandmother and sister; Diabetes in her brother and brother; Emphysema in her brother; Heart disease in her brother, father, mother, and unknown relative; Kidney failure in her brother; Lung cancer in her mother; Parkinsonism in her sister; Stroke in her brother, maternal grandmother, and paternal grandmother  Social History: former smoker, denies alcohol.   Diet: Low sodium diet, eats lots of raw veggies and fruit. Drinks tea, lemonade, water, and milk. No sodas.   Exercise: Moves every day. Participates in Sit and be Fit program on TV.  Home BP readings: Variable with range of 103-148/64-66mmHg.  76% in goal of <130/48mmHg.  Wt Readings from Last 3 Encounters:  08/13/17 195 lb (88.5 kg)  08/02/17 197 lb 6.4 oz (89.5 kg)  07/22/17 197 lb (89.4 kg)   BP Readings from Last 3 Encounters:  08/16/17 (!) 146/78  08/13/17 121/78  08/02/17 (!) 150/82   Pulse Readings from Last 3 Encounters:  08/16/17 70  08/13/17 64  08/02/17 70    Renal function: CrCl cannot be calculated (Patient's most recent lab result is older than the maximum 21 days allowed.).  Past Medical History:  Diagnosis Date  . Abnormal CT of the chest 2008   last CT4-l 2009:  . No f/u suggested   . Allergy   . Asthma   . CAD (coronary artery disease)    a. Coronary CTA 10/16: Coronary Ca score 211, mod non-obstructive CAD with LM mild plaque (25-50%), mid LAD 50-69%. b. Neg nuc 06/2015.  . Cataract    BILATERAL-REMOVED  . Chronic diastolic CHF (congestive heart failure) ([place])   . Collagen vascular disease ([place])    "arterial sclerosis" per pt  . Complication of anesthesia    trouble waking up  . Fibromyalgia   . GERD (gastroesophageal reflux disease)   . H/O hiatal hernia   . Heart murmur   . Hyperlipidemia   . Hypertension   . Malignant neoplasm of ascending colon ([place]) 2016   Minimally invasive right hemicolectomy to be done   .  Neuromuscular disorder (HCC)    FIBROMYALGIA  . Ocular migraine   . OSA (obstructive sleep apnea) 09/2007   dx w/ a sleep study, not on  CPAP  . Osteoarthritis   . Osteoporosis   . Pneumonia    "double" in 2004  . PONV (postoperative nausea and vomiting)   . Reactive airway disease 01/29/2002   dx of pseudoasthma / vcd in 2005 and nl sprirometry History of dyspnea, 2011,  improved after several medications were changed around Question of COPD, disproved July 06, 2009 with nl pft's      . Rheumatoid factor positive   . Shingles 11/2009  . Sleep apnea   . Stroke ([place])   . TIA (transient ischemic attack)    x2 - on Plavix for this  . Torn rotator cuff    right worse than  left, both are torn  . Tumor, 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  . Type II diabetes mellitus ([place])   . 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 Vaginal dysplasia     Current Outpatient Medications on File Prior to Visit  Medication Sig Dispense Refill  . acetaminophen (TYLENOL) 500 MG tablet Take 1,000 mg by mouth every 6 (six) hours as needed for moderate pain.    [name] albuterol (PROVENTIL HFA;VENTOLIN HFA) 108 (90 Base) MCG/ACT inhaler Inhale 2 puffs into the lungs every 6 (six) hours as needed for wheezing or shortness of breath. 18 g 3  . atorvastatin (LIPITOR) 20 MG tablet TAKE 1 TABLET (20 MG TOTAL) BY MOUTH DAILY. 90 tablet 1  . budesonide-formoterol (SYMBICORT) 80-4.5 MCG/ACT inhaler Inhale 2 puffs into the lungs 2 (two) times daily. 1 Inhaler 3  . butalbital-acetaminophen-caffeine (FIORICET) 50-325-40 MG tablet Take 1 tablet by mouth every 6 (six) hours as needed for headache or migraine. 30 tablet 5  . CALCIUM PO Take 1 tablet by mouth every morning.     . cetirizine (ZYRTEC) 10 MG tablet Take 1 tablet (10 mg total) by mouth daily. 90 tablet 1  . cholecalciferol (VITAMIN D) 1000 UNITS tablet Take 1,000 Units by mouth every morning.     . clopidogrel (PLAVIX) 75 MG tablet TAKE 1 TABLET(75 MG) BY MOUTH DAILY 90 tablet 1  . hydrochlorothiazide (HYDRODIURIL) 25 MG tablet Take 0.5 tablets (12.5 mg total) by mouth daily. 90 tablet 1  . hydrocortisone 2.5 % cream Apply topically 2 (two) times daily. (Patient taking differently: Apply 1 application topically 2 (two) times daily as needed (for rash). ) 30 g 0  . isosorbide mononitrate (IMDUR) 30 MG 24 hr tablet Take 1 tablet (30 mg total) by mouth daily. 90 tablet 1  . lisinopril (PRINIVIL,ZESTRIL) 40 MG tablet Take 1 tablet (40 mg total) by mouth daily. 90 tablet 1  . LYRICA 100 MG capsule TAKE 1 CAPSULE BY MOUTH EVERY DAY AS NEEDED FOR PAIN 30 capsule 0  . metFORMIN (GLUCOPHAGE-XR) 500 MG 24 hr tablet TAKE 1 TABLET(500 MG) BY MOUTH DAILY WITH  BREAKFAST 90 tablet 1  . Multiple Vitamin (MULTIVITAMIN) capsule Take 1 capsule by mouth daily.      . nebivolol (BYSTOLIC) 10 MG tablet Take 1 tablet (10 mg total) by mouth daily. 90 tablet 1  . nitroGLYCERIN (NITROSTAT) 0.4 MG SL tablet Place 0.4 mg under the tongue every 5 (five) minutes as needed for chest pain (x 3 doses).    Vladimir Faster Glycol-Propyl Glycol (SYSTANE) 0.4-0.3 % GEL Place 1 drop into both eyes daily as needed (dry  eyes).     . Potassium 99 MG TABS Take 99 mg by mouth daily.     . promethazine (PHENERGAN) 25 MG tablet Take 1 tablet (25 mg total) by mouth every 8 (eight) hours as needed for nausea or vomiting. 20 tablet 0  . ranitidine (ZANTAC) 300 MG tablet TAKE 1 TABLET(300 MG) BY MOUTH AT BEDTIME 90 tablet 1   No current facility-administered medications on file prior to visit.     Allergies  Allergen Reactions  . Bactrim [Sulfamethoxazole-Trimethoprim] Other (See Comments)    See OV 09-15-13, rash-tongue swelling due to bactrim ?  [name] Cefuroxime Axetil     REACTION: urticaria (hives)  . Oxycodone Nausea And Vomiting  . Pravastatin Other (See Comments)    "muscle breakdown" with profuse sweating  . Seldane [Terfenadine]     REACTION: urticaria (hives)  . Zocor [Simvastatin]     2012 "Muscle breakdown " with profuse sweating  . Tramadol Other (See Comments)    REACTION: vomitting  . Lime Flavor [Flavoring Agent] Diarrhea  . Tape Other (See Comments)    blisters     Assessment/Plan:  1. Hypertension - Blood pressure is at goal of <130/60mmHg. Continue taking HCTZ 12.5mg  daily, Imdur 30mg  daily, Lisinopril 40mg  daily, and nebivolol 10mg  daily. Continue healthy diet and increasing exercise. Patient will call clinic if she has readings <100/60 or any side effects of hypotension. Follow up in HTN clinic as needed.    Isaias Sakai, Sherian Rein D PGY1 Pharmacy Resident  Phone [phone] 09/11/2017      11:20 AM

## 2017-09-11 NOTE — Patient Instructions (Addendum)
It was great to see you today.  Your blood pressure was at goal of less than 130/58mmHg.  Continue taking HCTZ 12.5mg  daily, Imdur 30mg  daily, Lisinopril 40mg  daily, and nebivolol 10mg  daily.  If you have readings of the top number less than 100 or the bottom number less than 60 or if you start having dizziness, light headedness or loss of balance call the clinic at [phone].

## 2017-09-26 ENCOUNTER — Other Ambulatory Visit: Payer: Self-pay | Admitting: Internal Medicine

## 2017-10-15 NOTE — Progress Notes (Signed)
Subjective:    Patient ID: Samantha [name], female    DOB: 12-May-1940, 77 y.o.   MRN: [id number]  HPI The patient is here for an acute visit.  Rash under breasts and belly:   The rash is itchy.  It just started about one week ago.  It started after she was doing a lot of running around and sweating,.  The rash is now getting darker.  She denies rash elsewhere.    Back pain:  She has known spondylosis of the lumbar region w/o myelopathy or radiculopathy.  She has seen Dr Lorin Mercy for this.  She fell the beginning of the month.  She did not have any lower back pain prior to the fall, but since then she has had pain.  She has pain and swelling across her lower back.  She denies radiation of the pain into the hips or legs.  She has sprayed some otc back pain medication and has used the voltaren gel and that has helped a little.  She has also soaked in a hot tub and that has helped.    She also bruised her ribs, wrists and shoulder.  She denies major injuries - she just feels achy all over.    Painful varicose vein of left lower leg:  It started yesterday and is very painful.  She denies redness.  She is unsure if she bumped the area or not.  There is no bruise.     She has had a low level of inflammation from the fall and has been feverish.   Overall her pain is improving from the fall, but she just wanted to make sure she did not break something in the lower back.  Medications and allergies reviewed with patient and updated if appropriate.  Patient Active Problem List   Diagnosis Date Noted  . Candidiasis of skin 10/16/2017  . Sacral back pain 10/16/2017  . Epistaxis 07/23/2017  . Headache 07/23/2017  . Chronic left upper quadrant pain 07/23/2017  . Rectal bleeding 07/23/2017  . Poor balance 03/14/2017  . Sprain of right ankle 03/14/2017  . Right shoulder pain 03/14/2017  . Hair loss 10/19/2016  . Difficulty urinating 04/18/2016  . Angina pectoris ([place]) 03/22/2016  . Spondylosis  of lumbar region without myelopathy or radiculopathy 02/02/2016  . CAD in native artery 08/22/2015  . Hypertensive heart disease 07/07/2015  . Essential hypertension 07/07/2015  . SOB (shortness of breath) 07/07/2015  . Dizziness 07/07/2015  . Plantar fasciitis, left 03/22/2015  . Hx of colon cancer, stage I 01/11/2015  . Coronary artery disease due to lipid rich plaque 01/05/2015  . Chronic diastolic CHF (congestive heart failure), NYHA class 2 ([place]) 01/05/2015  . 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 Migraine (Ocular) 01/05/2014  . Gait difficulty 10/06/2013  . Pain in joint, shoulder region 11/27/2012  . VBI (vertebrobasilar insufficiency) 08/22/2012  . Right knee pain 07/29/2012  . TIA (transient ischemic attack) 06/27/2012  . Edema 02/01/2012  . DJD (degenerative joint disease) 02/02/2011  . 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 Postherpetic neuralgia ? 01/02/2010  . NECK PAIN 10/05/2009  . Palpitations 11/23/2008  . UTI'S, RECURRENT 09/28/2008  . Fibromyalgia 08/15/2007  . 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 Hyperlipidemia 05/21/2006  . Reactive airway disease 01/29/2002    Current Outpatient Medications on File Prior to Visit  Medication Sig Dispense Refill  . acetaminophen (TYLENOL) 500  MG tablet Take 1,000 mg by mouth every 6 (six) hours as needed for moderate pain.    [name] albuterol (PROVENTIL HFA;VENTOLIN HFA) 108 (90 Base) MCG/ACT inhaler Inhale 2 puffs into the lungs every 6 (six) hours as needed for wheezing or shortness of breath. 18 g 3  . atorvastatin (LIPITOR) 20 MG tablet TAKE 1 TABLET (20 MG TOTAL) BY MOUTH DAILY. 90 tablet 1  . budesonide-formoterol (SYMBICORT) 80-4.5 MCG/ACT inhaler Inhale 2 puffs into the lungs 2 (two) times daily. 1 Inhaler 3  . butalbital-acetaminophen-caffeine (FIORICET) 50-325-40 MG tablet Take 1 tablet by mouth every 6 (six) hours as needed for  headache or migraine. 30 tablet 5  . CALCIUM PO Take 1 tablet by mouth every morning.     . cetirizine (ZYRTEC) 10 MG tablet Take 1 tablet (10 mg total) by mouth daily. 90 tablet 1  . cholecalciferol (VITAMIN D) 1000 UNITS tablet Take 1,000 Units by mouth every morning.     . clopidogrel (PLAVIX) 75 MG tablet TAKE 1 TABLET(75 MG) BY MOUTH DAILY 90 tablet 1  . hydrochlorothiazide (HYDRODIURIL) 25 MG tablet Take 0.5 tablets (12.5 mg total) by mouth daily. 90 tablet 1  . hydrocortisone 2.5 % cream Apply topically 2 (two) times daily. (Patient taking differently: Apply 1 application topically 2 (two) times daily as needed (for rash). ) 30 g 0  . isosorbide mononitrate (IMDUR) 30 MG 24 hr tablet Take 1 tablet (30 mg total) by mouth daily. 90 tablet 1  . lisinopril (PRINIVIL,ZESTRIL) 40 MG tablet Take 1 tablet (40 mg total) by mouth daily. 90 tablet 1  . LYRICA 100 MG capsule TAKE 1 CAPSULE BY MOUTH EVERY DAY AS NEEDED FOR PAIN 30 capsule 0  . metFORMIN (GLUCOPHAGE-XR) 500 MG 24 hr tablet TAKE 1 TABLET(500 MG) BY MOUTH DAILY WITH BREAKFAST 90 tablet 1  . Multiple Vitamin (MULTIVITAMIN) capsule Take 1 capsule by mouth daily.      . nebivolol (BYSTOLIC) 10 MG tablet Take 1 tablet (10 mg total) by mouth daily. 90 tablet 1  . nitroGLYCERIN (NITROSTAT) 0.4 MG SL tablet Place 0.4 mg under the tongue every 5 (five) minutes as needed for chest pain (x 3 doses).    Vladimir Faster Glycol-Propyl Glycol (SYSTANE) 0.4-0.3 % GEL Place 1 drop into both eyes daily as needed (dry eyes).     . Potassium 99 MG TABS Take 99 mg by mouth daily.     . promethazine (PHENERGAN) 25 MG tablet Take 1 tablet (25 mg total) by mouth every 8 (eight) hours as needed for nausea or vomiting. 20 tablet 0  . ranitidine (ZANTAC) 300 MG tablet TAKE 1 TABLET(300 MG) BY MOUTH AT BEDTIME 90 tablet 0   No current facility-administered medications on file prior to visit.     Past Medical History:  Diagnosis Date  . Abnormal CT of the chest 2008    last CT4-l 2009:  . No f/u suggested   . Allergy   . Asthma   . CAD (coronary artery disease)    a. Coronary CTA 10/16: Coronary Ca score 211, mod non-obstructive CAD with LM mild plaque (25-50%), mid LAD 50-69%. b. Neg nuc 06/2015.  . Cataract    BILATERAL-REMOVED  . Chronic diastolic CHF (congestive heart failure) ([place])   . Collagen vascular disease ([place])    "arterial sclerosis" per pt  . Complication of anesthesia    trouble waking up  . Fibromyalgia   . GERD (gastroesophageal reflux disease)   . H/O hiatal hernia   .  Heart murmur   . Hyperlipidemia   . Hypertension   . Malignant neoplasm of ascending colon ([place]) 2016   Minimally invasive right hemicolectomy to be done   . Neuromuscular disorder (HCC)    FIBROMYALGIA  . Ocular migraine   . OSA (obstructive sleep apnea) 09/2007   dx w/ a sleep study, not on  CPAP  . Osteoarthritis   . Osteoporosis   . Pneumonia    "double" in 2004  . PONV (postoperative nausea and vomiting)   . Reactive airway disease 01/29/2002   dx of pseudoasthma / vcd in 2005 and nl sprirometry History of dyspnea, 2011,  improved after several medications were changed around Question of COPD, disproved July 06, 2009 with nl pft's      . Rheumatoid factor positive   . Shingles 11/2009  . Sleep apnea   . Stroke ([place])   . TIA (transient ischemic attack)    x2 - on Plavix for this  . Torn rotator cuff    right worse than left, both are torn  . Tumor, 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  . Type II diabetes mellitus ([place])   . 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 Vaginal dysplasia     Past Surgical History:  Procedure Laterality Date  . ABDOMINAL HYSTERECTOMY  1980   NO oophorectomy per pt   . ANTERIOR CERVICAL DECOMP/DISCECTOMY FUSION  2001   C 3, C4 and C5 plate and screws  . BREAST BIOPSY Right 1999  . BUNIONECTOMY Left ~ 1977  . CATARACT EXTRACTION W/ INTRAOCULAR LENS  IMPLANT, BILATERAL  2012  . COLON SURGERY  10/2014  . EYE SURGERY  Bilateral    torq lens for cataracts  . LEFT HEART CATH AND CORONARY ANGIOGRAPHY N/A 03/22/2016   Procedure: Left Heart Cath and Coronary Angiography;  Surgeon: Belva Crome, MD;  Location: [facility];  Service: Cardiovascular;  Laterality: N/A;  . THYROIDECTOMY, PARTIAL  1960's  . VAGINAL MASS EXCISION  1994   "Laser surgery for vaginal cancer; followed by chemotherapy" (06/27/2012)    Social History   Socioeconomic History  . Marital status: Married    Spouse name: Ilona Sorrel  . Number of children: 2  . Years of education: masters  . Highest education level: Not on file  Occupational History  . Occupation: Retired, disable since 2000    Employer: RETIRED  Social Needs  . Financial resource strain: Not hard at all  . Food insecurity:    Worry: Never true    Inability: Never true  . Transportation needs:    Medical: No    Non-medical: No  Tobacco Use  . Smoking status: Former Smoker    Packs/day: 0.25    Years: 5.00    Pack years: 1.25    Types: Cigarettes    Last attempt to quit: 01/29/1998    Years since quitting: 19.7  . Smokeless tobacco: Never Used  . Tobacco comment: Quit in 2001  Substance and Sexual Activity  . Alcohol use: No    Alcohol/week: 0.0 standard drinks  . Drug use: No  . Sexual activity: Never  Lifestyle  . Physical activity:    Days per week: 0 days    Minutes per session: 0 min  . Stress: Very much  Relationships  . Social connections:    Talks on phone: More than three times a week    Gets together: More than three times a week    Attends  religious service: More than 4 times per year    Active member of club or organization: Yes    Attends meetings of clubs or organizations: More than 4 times per year    Relationship status: Married  Other Topics Concern  . Not on file  Social History Narrative   On disability since 2000--- also husband has MS   Education. College.   Right handed.    Family History  Problem Relation Age of Onset   . Heart disease Father   . Heart disease Mother   . Lung cancer Mother   . Allergies Sister   . Parkinsonism Sister        possible  . Asthma Sister   . Asthma Paternal Grandmother   . Stroke Paternal Grandmother   . Heart disease Unknown        paternal grandparents, maternal grandparents,   . Heart disease Brother   . Emphysema Brother   . Aneurysm Brother        x3  . Kidney failure Brother   . Diabetes Brother   . Diabetes Brother   . Stroke Brother   . Stroke Maternal Grandmother   . Breast cancer Neg Hx   . Colon cancer Neg Hx   . Heart attack Neg Hx     Review of Systems  Constitutional: Positive for fever (Subjective).  Respiratory: Negative for shortness of breath.   Cardiovascular: Negative for chest pain.  Musculoskeletal: Positive for arthralgias and back pain (Sacral region).       No radiating pain from back  Skin: Positive for rash.  Neurological: Negative for weakness and numbness.       Objective:   Vitals:   10/16/17 1315  BP: (!) 142/72  Pulse: 69  Resp: 16  Temp: 98.3 F (36.8 C)  SpO2: 97%   BP Readings from Last 3 Encounters:  10/16/17 (!) 142/72  09/11/17 (!) 102/58  08/16/17 (!) 146/78   Wt Readings from Last 3 Encounters:  10/16/17 196 lb (88.9 kg)  08/13/17 195 lb (88.5 kg)  08/02/17 197 lb 6.4 oz (89.5 kg)   Body mass index is 35.85 kg/m.   Physical Exam  Constitutional: She is oriented to person, place, and time. She appears well-developed and well-nourished. No distress.  HENT:  Head: Normocephalic and atraumatic.  Musculoskeletal: She exhibits no edema.  Tenderness with palpation sacral region, no obvious deformity  Neurological: She is alert and oriented to person, place, and time. No sensory deficit.  Normal strength bilateral lower extremities  Skin: Skin is warm and dry. Rash (Under breasts and pannus) noted. She is not diaphoretic.           Assessment & Plan:    See Problem List for Assessment and  Plan of chronic medical problems.

## 2017-10-16 ENCOUNTER — Ambulatory Visit (INDEPENDENT_AMBULATORY_CARE_PROVIDER_SITE_OTHER): Payer: Medicare Other | Admitting: Internal Medicine

## 2017-10-16 ENCOUNTER — Encounter: Payer: Self-pay | Admitting: Internal Medicine

## 2017-10-16 ENCOUNTER — Ambulatory Visit (INDEPENDENT_AMBULATORY_CARE_PROVIDER_SITE_OTHER)
Admission: RE | Admit: 2017-10-16 | Discharge: 2017-10-16 | Disposition: A | Discharge status: Home / Self Care | Payer: Medicare Other | Source: Ambulatory Visit | Attending: Internal Medicine | Admitting: Internal Medicine

## 2017-10-16 ENCOUNTER — Other Ambulatory Visit: Payer: Medicare Other

## 2017-10-16 VITALS — BP 142/72 | HR 69 | Temp 98.3°F | Resp 16 | Ht 62.0 in | Wt 196.0 lb

## 2017-10-16 DIAGNOSIS — Z23 Encounter for immunization: Secondary | ICD-10-CM

## 2017-10-16 DIAGNOSIS — I2510 Atherosclerotic heart disease of native coronary artery without angina pectoris: Secondary | ICD-10-CM

## 2017-10-16 DIAGNOSIS — B372 Candidiasis of skin and nail: Secondary | ICD-10-CM | POA: Diagnosis not present

## 2017-10-16 DIAGNOSIS — M533 Sacrococcygeal disorders, not elsewhere classified: Secondary | ICD-10-CM | POA: Diagnosis not present

## 2017-10-16 DIAGNOSIS — S3992XA Unspecified injury of lower back, initial encounter: Secondary | ICD-10-CM | POA: Diagnosis not present

## 2017-10-16 MED ORDER — NYSTATIN 100000 UNIT/GM EX CREA: 1.0000 "application " | TOPICAL_CREAM | Freq: Two times a day (BID) | CUTANEOUS | 5 refills | Status: DC

## 2017-10-16 NOTE — Assessment & Plan Note (Signed)
Rash sounds consistent with yeast infection  Nystatin cream BID

## 2017-10-16 NOTE — Assessment & Plan Note (Signed)
Pain in sacral region after fall approximately 2 weeks ago Her pain is improving and she likely just bruised the bone significantly We will get an x-ray to rule out fracture Continue symptomatic treatment-she feels her pain is controlled with her current home regimen Call if pain does not continue to improve

## 2017-10-16 NOTE — Patient Instructions (Signed)
Xray of lower back was ordered.  Tests ordered today. Your results will be released to Samantha [name] (or called to you) after review, usually within 72hours after test completion. If any changes need to be made, you will be notified at that same time.  Flu immunization administered today.    Medications reviewed and updated.  Changes include :   Using nystatin cream twice daily as needed for your rash.  Your prescription(s) have been submitted to your pharmacy. Please take as directed and contact our office if you believe you are having problem(s) with the medication(s).

## 2017-10-17 ENCOUNTER — Encounter: Payer: Self-pay | Admitting: Internal Medicine

## 2017-10-18 ENCOUNTER — Other Ambulatory Visit: Payer: Self-pay | Admitting: Internal Medicine

## 2017-10-18 DIAGNOSIS — I2510 Atherosclerotic heart disease of native coronary artery without angina pectoris: Secondary | ICD-10-CM

## 2017-10-18 DIAGNOSIS — I5032 Chronic diastolic (congestive) heart failure: Secondary | ICD-10-CM

## 2017-10-18 DIAGNOSIS — R0602 Shortness of breath: Secondary | ICD-10-CM

## 2017-10-18 DIAGNOSIS — R5383 Other fatigue: Secondary | ICD-10-CM

## 2017-10-18 DIAGNOSIS — I10 Essential (primary) hypertension: Secondary | ICD-10-CM

## 2017-10-18 DIAGNOSIS — R42 Dizziness and giddiness: Secondary | ICD-10-CM

## 2017-10-18 NOTE — Telephone Encounter (Signed)
Last refill was 08/28/17 per [place] was 10/16/17 Next OV is 10/23/17

## 2017-10-22 ENCOUNTER — Telehealth: Payer: Self-pay

## 2017-10-22 NOTE — Telephone Encounter (Signed)
Copied from [place] [id number]. Topic: Inquiry >> Oct 21, 2017 10:23 AM Mylinda Latina, NT wrote: Reason for CRM: Patient called and states she was seen by Dr. Charlett Blake on 10/16/17. She is unsure if she needs to see Dr. Charlett Blake on 10/23/17. Please call patient if she needs to keep this appt or if she can cancel it. CB#  [phone] >> Oct 21, 2017 10:29 AM Para Skeans A wrote: It does not say ing AVS, when she wants patient to return. This appointment on 9/25 is FU 76months / The other was a acute appointment.. Does she need to keep the 9/25?

## 2017-10-22 NOTE — Progress Notes (Signed)
Subjective:    Patient ID: Samantha [name], female    DOB: 11-02-1940, 77 y.o.   MRN: [id number]  HPI The patient is here for follow up.  CAD, h/o TIA, chronic diastolic HF, Hypertension: She is taking her medication daily. She is compliant with a low sodium diet.  She does have intermittent chest pain, but does follow with cardiology.  She had a cardiac catheterization 18 months ago that showed no significant blockages.  She has intermittent palpitations that have improved.  She has chronic leg edema that is unchanged.  She denies shortness of breath and regular headaches. She is not exercising regularly.     Hyperlipidemia: She is taking her medication daily. She is compliant with a low fat/cholesterol diet. She is not exercising regularly. She denies myalgias.   Diabetes with neuropathy: She is taking her medication daily as prescribed. She is compliant with a diabetic diet. She is not exercising regularly.  She checks her feet daily and denies foot lesions. She is up-to-date with an ophthalmology examination.    Fibromyalgia:  She is taking Lyrica.  The Lyrica controls her symptoms and she feels her fibromyalgia pain is well controlled.  GERD:  She is taking her medication daily as prescribed.  She denies any GERD symptoms and feels her GERD is well controlled.    Medications and allergies reviewed with patient and updated if appropriate.  Patient Active Problem List   Diagnosis Date Noted  . Candidiasis of skin 10/16/2017  . Sacral back pain 10/16/2017  . Epistaxis 07/23/2017  . Headache 07/23/2017  . Chronic left upper quadrant pain 07/23/2017  . Rectal bleeding 07/23/2017  . Poor balance 03/14/2017  . Sprain of right ankle 03/14/2017  . Right shoulder pain 03/14/2017  . Hair loss 10/19/2016  . Difficulty urinating 04/18/2016  . Angina pectoris ([place]) 03/22/2016  . Spondylosis of lumbar region without myelopathy or radiculopathy 02/02/2016  . CAD in native artery  08/22/2015  . Hypertensive heart disease 07/07/2015  . Essential hypertension 07/07/2015  . SOB (shortness of breath) 07/07/2015  . Dizziness 07/07/2015  . Plantar fasciitis, left 03/22/2015  . Hx of colon cancer, stage I 01/11/2015  . Coronary artery disease due to lipid rich plaque 01/05/2015  . Chronic diastolic CHF (congestive heart failure), NYHA class 2 ([place]) 01/05/2015  . 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 Migraine (Ocular) 01/05/2014  . Gait difficulty 10/06/2013  . Pain in joint, shoulder region 11/27/2012  . VBI (vertebrobasilar insufficiency) 08/22/2012  . Right knee pain 07/29/2012  . TIA (transient ischemic attack) 06/27/2012  . Edema 02/01/2012  . DJD (degenerative joint disease) 02/02/2011  . 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 Postherpetic neuralgia ? 01/02/2010  . NECK PAIN 10/05/2009  . Palpitations 11/23/2008  . UTI'S, RECURRENT 09/28/2008  . Fibromyalgia 08/15/2007  . 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 Hyperlipidemia 05/21/2006  . Reactive airway disease 01/29/2002    Current Outpatient Medications on File Prior to Visit  Medication Sig Dispense Refill  . acetaminophen (TYLENOL) 500 MG tablet Take 1,000 mg by mouth every 6 (six) hours as needed for moderate pain.    [name] albuterol (PROVENTIL HFA;VENTOLIN HFA) 108 (90 Base) MCG/ACT inhaler Inhale 2 puffs into the lungs every 6 (six) hours as needed for wheezing or shortness of breath. 18 g 3  . atorvastatin (LIPITOR) 20 MG tablet TAKE 1 TABLET (20 MG TOTAL) BY MOUTH  DAILY. 90 tablet 1  . budesonide-formoterol (SYMBICORT) 80-4.5 MCG/ACT inhaler Inhale 2 puffs into the lungs 2 (two) times daily. 1 Inhaler 3  . butalbital-acetaminophen-caffeine (FIORICET) 50-325-40 MG tablet Take 1 tablet by mouth every 6 (six) hours as needed for headache or migraine. 30 tablet 5  . CALCIUM PO Take 1 tablet by mouth every morning.     .  cetirizine (ZYRTEC) 10 MG tablet Take 1 tablet (10 mg total) by mouth daily. 90 tablet 1  . cholecalciferol (VITAMIN D) 1000 UNITS tablet Take 1,000 Units by mouth every morning.     . clopidogrel (PLAVIX) 75 MG tablet TAKE 1 TABLET(75 MG) BY MOUTH DAILY 90 tablet 1  . hydrochlorothiazide (HYDRODIURIL) 25 MG tablet Take 0.5 tablets (12.5 mg total) by mouth daily. 90 tablet 1  . hydrocortisone 2.5 % cream Apply topically 2 (two) times daily. (Patient taking differently: Apply 1 application topically 2 (two) times daily as needed (for rash). ) 30 g 0  . isosorbide mononitrate (IMDUR) 30 MG 24 hr tablet Take 1 tablet (30 mg total) by mouth daily. 90 tablet 1  . lisinopril (PRINIVIL,ZESTRIL) 40 MG tablet Take 1 tablet (40 mg total) by mouth daily. 90 tablet 1  . LYRICA 100 MG capsule TAKE 1 CAPSULE BY MOUTH EVERY DAY AS NEEDED FOR PAIN 30 capsule 0  . metFORMIN (GLUCOPHAGE-XR) 500 MG 24 hr tablet TAKE 1 TABLET(500 MG) BY MOUTH DAILY WITH BREAKFAST 90 tablet 1  . Multiple Vitamin (MULTIVITAMIN) capsule Take 1 capsule by mouth daily.      . nebivolol (BYSTOLIC) 10 MG tablet Take 1 tablet (10 mg total) by mouth daily. 90 tablet 1  . nitroGLYCERIN (NITROSTAT) 0.4 MG SL tablet Place 0.4 mg under the tongue every 5 (five) minutes as needed for chest pain (x 3 doses).    . nystatin cream (MYCOSTATIN) Apply 1 application topically 2 (two) times daily. 30 g 5  . Polyethyl Glycol-Propyl Glycol (SYSTANE) 0.4-0.3 % GEL Place 1 drop into both eyes daily as needed (dry eyes).     . Potassium 99 MG TABS Take 99 mg by mouth daily.     . promethazine (PHENERGAN) 25 MG tablet Take 1 tablet (25 mg total) by mouth every 8 (eight) hours as needed for nausea or vomiting. 20 tablet 0  . ranitidine (ZANTAC) 300 MG tablet TAKE 1 TABLET(300 MG) BY MOUTH AT BEDTIME 90 tablet 0   No current facility-administered medications on file prior to visit.     Past Medical History:  Diagnosis Date  . Abnormal CT of the chest 2008    last CT4-l 2009:  . No f/u suggested   . Allergy   . Asthma   . CAD (coronary artery disease)    a. Coronary CTA 10/16: Coronary Ca score 211, mod non-obstructive CAD with LM mild plaque (25-50%), mid LAD 50-69%. b. Neg nuc 06/2015.  . Cataract    BILATERAL-REMOVED  . Chronic diastolic CHF (congestive heart failure) ([place])   . Collagen vascular disease ([place])    "arterial sclerosis" per pt  . Complication of anesthesia    trouble waking up  . Fibromyalgia   . GERD (gastroesophageal reflux disease)   . H/O hiatal hernia   . Heart murmur   . Hyperlipidemia   . Hypertension   . Malignant neoplasm of ascending colon ([place]) 2016   Minimally invasive right hemicolectomy to be done   . Neuromuscular disorder ([facility])    FIBROMYALGIA  . Ocular migraine   .  OSA (obstructive sleep apnea) 09/2007   dx w/ a sleep study, not on  CPAP  . Osteoarthritis   . Osteoporosis   . Pneumonia    "double" in 2004  . PONV (postoperative nausea and vomiting)   . Reactive airway disease 01/29/2002   dx of pseudoasthma / vcd in 2005 and nl sprirometry History of dyspnea, 2011,  improved after several medications were changed around Question of COPD, disproved July 06, 2009 with nl pft's      . Rheumatoid factor positive   . Shingles 11/2009  . Sleep apnea   . Stroke ([place])   . TIA (transient ischemic attack)    x2 - on Plavix for this  . Torn rotator cuff    right worse than left, both are torn  . Tumor, 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  . Type II diabetes mellitus ([place])   . 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 Vaginal dysplasia     Past Surgical History:  Procedure Laterality Date  . ABDOMINAL HYSTERECTOMY  1980   NO oophorectomy per pt   . ANTERIOR CERVICAL DECOMP/DISCECTOMY FUSION  2001   C 3, C4 and C5 plate and screws  . BREAST BIOPSY Right 1999  . BUNIONECTOMY Left ~ 1977  . CATARACT EXTRACTION W/ INTRAOCULAR LENS  IMPLANT, BILATERAL  2012  . COLON SURGERY  10/2014  . EYE SURGERY Bilateral     torq lens for cataracts  . LEFT HEART CATH AND CORONARY ANGIOGRAPHY N/A 03/22/2016   Procedure: Left Heart Cath and Coronary Angiography;  Surgeon: Belva Crome, MD;  Location: [facility];  Service: Cardiovascular;  Laterality: N/A;  . THYROIDECTOMY, PARTIAL  1960's  . VAGINAL MASS EXCISION  1994   "Laser surgery for vaginal cancer; followed by chemotherapy" (06/27/2012)    Social History   Socioeconomic History  . Marital status: Married    Spouse name: Ilona Sorrel  . Number of children: 2  . Years of education: masters  . Highest education level: Not on file  Occupational History  . Occupation: Retired, disable since 2000    Employer: RETIRED  Social Needs  . Financial resource strain: Not hard at all  . Food insecurity:    Worry: Never true    Inability: Never true  . Transportation needs:    Medical: No    Non-medical: No  Tobacco Use  . Smoking status: Former Smoker    Packs/day: 0.25    Years: 5.00    Pack years: 1.25    Types: Cigarettes    Last attempt to quit: 01/29/1998    Years since quitting: 19.7  . Smokeless tobacco: Never Used  . Tobacco comment: Quit in 2001  Substance and Sexual Activity  . Alcohol use: No    Alcohol/week: 0.0 standard drinks  . Drug use: No  . Sexual activity: Never  Lifestyle  . Physical activity:    Days per week: 0 days    Minutes per session: 0 min  . Stress: Very much  Relationships  . Social connections:    Talks on phone: More than three times a week    Gets together: More than three times a week    Attends religious service: More than 4 times per year    Active member of club or organization: Yes    Attends meetings of clubs or organizations: More than 4 times per year    Relationship status: Married  Other Topics Concern  . Not  on file  Social History Narrative   On disability since 2000--- also husband has MS   Education. College.   Right handed.    Family History  Problem Relation Age of Onset  . Heart  disease Father   . Heart disease Mother   . Lung cancer Mother   . Allergies Sister   . Parkinsonism Sister        possible  . Asthma Sister   . Asthma Paternal Grandmother   . Stroke Paternal Grandmother   . Heart disease Unknown        paternal grandparents, maternal grandparents,   . Heart disease Brother   . Emphysema Brother   . Aneurysm Brother        x3  . Kidney failure Brother   . Diabetes Brother   . Diabetes Brother   . Stroke Brother   . Stroke Maternal Grandmother   . Breast cancer Neg Hx   . Colon cancer Neg Hx   . Heart attack Neg Hx     Review of Systems  Constitutional: Negative for chills and fever.  Respiratory: Positive for wheezing. Negative for cough and shortness of breath.   Cardiovascular: Positive for chest pain, palpitations (occ - better) and leg swelling (chronic - no change).  Neurological: Positive for headaches (occasional). Negative for light-headedness.       Objective:   Vitals:   10/23/17 0954  BP: 120/64  Pulse: 65  Resp: 16  Temp: 98.4 F (36.9 C)  SpO2: 95%   BP Readings from Last 3 Encounters:  10/23/17 120/64  10/16/17 (!) 142/72  09/11/17 (!) 102/58   Wt Readings from Last 3 Encounters:  10/23/17 194 lb (88 kg)  10/16/17 196 lb (88.9 kg)  08/13/17 195 lb (88.5 kg)   Body mass index is 35.48 kg/m.   Physical Exam    Constitutional: Appears well-developed and well-nourished. No distress.  HENT:  Head: Normocephalic and atraumatic.  Neck: Neck supple. No tracheal deviation present. No thyromegaly present.  No cervical lymphadenopathy Cardiovascular: Normal rate, regular rhythm and normal heart sounds.   No murmur heard. No carotid bruit .  No edema Pulmonary/Chest: Effort normal and breath sounds normal. No respiratory distress. No has no wheezes. No rales.  Skin: Skin is warm and dry. Not diaphoretic.  Psychiatric: Normal mood and affect. Behavior is normal.      Assessment & Plan:    See Problem List  for Assessment and Plan of chronic medical problems.

## 2017-10-22 NOTE — Telephone Encounter (Signed)
We did not discuss any of her chronic medical problems and she is due for blood work - she should come

## 2017-10-22 NOTE — Patient Instructions (Addendum)
  Tests ordered today. Your results will be released to MyChart (or called to you) after review, usually within 72hours after test completion. If any changes need to be made, you will be notified at that same time.  Flu immunization administered today.    Medications reviewed and updated.  Changes include :   none    Please followup in 6 months   

## 2017-10-22 NOTE — Telephone Encounter (Signed)
Pt aware of response and will keep appointment for tomorrow.

## 2017-10-22 NOTE — Telephone Encounter (Signed)
Would you advise that pt still see you tomorrow. This is supposed to be Dr. Quay Burow not Charlett Blake.

## 2017-10-23 ENCOUNTER — Ambulatory Visit (INDEPENDENT_AMBULATORY_CARE_PROVIDER_SITE_OTHER): Payer: Medicare Other | Admitting: Internal Medicine

## 2017-10-23 ENCOUNTER — Other Ambulatory Visit (INDEPENDENT_AMBULATORY_CARE_PROVIDER_SITE_OTHER): Payer: Medicare Other

## 2017-10-23 ENCOUNTER — Encounter: Payer: Self-pay | Admitting: Internal Medicine

## 2017-10-23 ENCOUNTER — Telehealth: Payer: Self-pay | Admitting: Internal Medicine

## 2017-10-23 VITALS — BP 120/64 | HR 65 | Temp 98.4°F | Resp 16 | Ht 62.0 in | Wt 194.0 lb

## 2017-10-23 DIAGNOSIS — E11311 Type 2 diabetes mellitus with unspecified diabetic retinopathy with macular edema: Secondary | ICD-10-CM

## 2017-10-23 DIAGNOSIS — G459 Transient cerebral ischemic attack, unspecified: Secondary | ICD-10-CM | POA: Diagnosis not present

## 2017-10-23 DIAGNOSIS — Z794 Long term (current) use of insulin: Secondary | ICD-10-CM

## 2017-10-23 DIAGNOSIS — M797 Fibromyalgia: Secondary | ICD-10-CM | POA: Diagnosis not present

## 2017-10-23 DIAGNOSIS — K219 Gastro-esophageal reflux disease without esophagitis: Secondary | ICD-10-CM | POA: Insufficient documentation

## 2017-10-23 DIAGNOSIS — I10 Essential (primary) hypertension: Secondary | ICD-10-CM

## 2017-10-23 DIAGNOSIS — Z23 Encounter for immunization: Secondary | ICD-10-CM | POA: Diagnosis not present

## 2017-10-23 DIAGNOSIS — E782 Mixed hyperlipidemia: Secondary | ICD-10-CM

## 2017-10-23 DIAGNOSIS — I2510 Atherosclerotic heart disease of native coronary artery without angina pectoris: Secondary | ICD-10-CM

## 2017-10-23 LAB — LIPID PANEL
CHOL/HDL RATIO: 2
Cholesterol: 140 mg/dL (ref 0–200)
HDL: 61.7 mg/dL (ref >39.00)
LDL CALC: 63 mg/dL (ref 0–99)
NonHDL: 78.76
TRIGLYCERIDES: 78 mg/dL (ref 0.0–149.0)
VLDL: 15.6 mg/dL (ref 0.0–40.0)

## 2017-10-23 LAB — CBC WITH DIFFERENTIAL/PLATELET
Basophils Absolute: 0 10*3/uL (ref 0.0–0.1)
Basophils Relative: 0.5 % (ref 0.0–3.0)
EOS PCT: 3.4 % (ref 0.0–5.0)
Eosinophils Absolute: 0.1 10*3/uL (ref 0.0–0.7)
HEMATOCRIT: 37.9 % (ref 36.0–46.0)
HEMOGLOBIN: 12.8 g/dL (ref 12.0–15.0)
LYMPHS PCT: 38.4 % (ref 12.0–46.0)
Lymphs Abs: 1.5 10*3/uL (ref 0.7–4.0)
MCHC: 33.7 g/dL (ref 30.0–36.0)
MCV: 83.3 fl (ref 78.0–100.0)
MONO ABS: 0.3 10*3/uL (ref 0.1–1.0)
Monocytes Relative: 8 % (ref 3.0–12.0)
Neutro Abs: 1.9 10*3/uL (ref 1.4–7.7)
Neutrophils Relative %: 49.7 % (ref 43.0–77.0)
Platelets: 142 10*3/uL — ABNORMAL LOW (ref 150.0–400.0)
RBC: 4.55 Mil/uL (ref 3.87–5.11)
RDW: 15.1 % (ref 11.5–15.5)
WBC: 3.8 10*3/uL — AB (ref 4.0–10.5)

## 2017-10-23 LAB — COMPREHENSIVE METABOLIC PANEL
ALBUMIN: 4.3 g/dL (ref 3.5–5.2)
ALK PHOS: 57 U/L (ref 39–117)
ALT: 12 U/L (ref 0–35)
AST: 13 U/L (ref 0–37)
BUN: 21 mg/dL (ref 6–23)
CO2: 29 mEq/L (ref 19–32)
Calcium: 9.4 mg/dL (ref 8.4–10.5)
Chloride: 104 mEq/L (ref 96–112)
Creatinine, Ser: 0.92 mg/dL (ref 0.40–1.20)
GFR: 76.44 mL/min (ref >60.00)
Glucose, Bld: 122 mg/dL — ABNORMAL HIGH (ref 70–99)
POTASSIUM: 3.9 meq/L (ref 3.5–5.1)
Sodium: 140 mEq/L (ref 135–145)
Total Bilirubin: 0.6 mg/dL (ref 0.2–1.2)
Total Protein: 7.4 g/dL (ref 6.0–8.3)

## 2017-10-23 LAB — HEMOGLOBIN A1C: Hgb A1c MFr Bld: 7 % — ABNORMAL HIGH (ref 4.6–6.5)

## 2017-10-23 NOTE — Assessment & Plan Note (Addendum)
Continue Plavix and atorvastatin  CBC, CMP, lipid panel

## 2017-10-23 NOTE — Assessment & Plan Note (Signed)
Pain adequately controlled Continue current dose of Lyrica

## 2017-10-23 NOTE — Assessment & Plan Note (Signed)
BP well controlled Current regimen effective and well tolerated Continue current medications at current doses cmp  

## 2017-10-23 NOTE — Assessment & Plan Note (Signed)
Will check A1c Continue current medication-we will adjust if necessary Encourage regular exercise

## 2017-10-23 NOTE — Addendum Note (Signed)
Addended by: Delice Bison E on: 10/23/2017 10:06 AM   Modules accepted: Orders

## 2017-10-23 NOTE — Telephone Encounter (Signed)
Patient is requesting a call back with results of x ray.  Patient states her back is still hurting her.

## 2017-10-23 NOTE — Assessment & Plan Note (Signed)
Check lipid panel  Continue daily statin Regular exercise and healthy diet encouraged  

## 2017-10-23 NOTE — Telephone Encounter (Signed)
Pt aware of xray results. Pt understood and had no further questions.

## 2017-10-23 NOTE — Assessment & Plan Note (Signed)
GERD controlled Continue daily medication  

## 2017-10-25 ENCOUNTER — Encounter: Payer: Self-pay | Admitting: Internal Medicine

## 2017-11-07 ENCOUNTER — Telehealth: Payer: Self-pay | Admitting: Internal Medicine

## 2017-11-07 NOTE — Telephone Encounter (Signed)
LVM to inform patient Dr.Burns would like for her to see sports med doctor, Dr.Smith. Please set her up for an appointment when she returns call. Thank you.

## 2017-11-07 NOTE — Progress Notes (Signed)
Samantha [name] - 77 y.o. female MRN [id number]  Date of birth: 03/19/1940  SUBJECTIVE:  Including CC & ROS.  Chief Complaint  Patient presents with  . Back Pain    Samantha [name] is a 77 y.o. female that is presenting with back pain. She did sustain a fall one month ago. Pain is mild to severe. Sitting, standing or laying down trigger constant pain. Pain is located  Sacral region. Tingling and and numbness in her legs. Her pain has not improved. She was seen on 10/16/17 by her primary care physician, imaging was obtained. She has been taking tylenol for the pain.   Independent review of the sacrum x-ray from 9/18 shows significant degenerative changes of the lumbar spine at L5-S1 and significant facet arthritis and left SI joint but no acute fracture.   Review of Systems  Constitutional: Negative for fever.  HENT: Negative for congestion.   Respiratory: Negative for cough.   Cardiovascular: Negative for chest pain.  Gastrointestinal: Negative for abdominal distention.  Musculoskeletal: Positive for back pain and gait problem.  Skin: Negative for color change.  Neurological: Negative for weakness.  Hematological: Negative for adenopathy.  Psychiatric/Behavioral: Negative for agitation.    HISTORY: Past Medical, Surgical, Social, and Family History Reviewed & Updated per EMR.   Pertinent Historical Findings include:  Past Medical History:  Diagnosis Date  . Abnormal CT of the chest 2008   last CT4-l 2009:  . No f/u suggested   . Allergy   . Asthma   . CAD (coronary artery disease)    a. Coronary CTA 10/16: Coronary Ca score 211, mod non-obstructive CAD with LM mild plaque (25-50%), mid LAD 50-69%. b. Neg nuc 06/2015.  . Cataract    BILATERAL-REMOVED  . Chronic diastolic CHF (congestive heart failure) ([place])   . Collagen vascular disease ([place])    "arterial sclerosis" per pt  . Complication of anesthesia    trouble waking up  . Fibromyalgia   . GERD (gastroesophageal  reflux disease)   . H/O hiatal hernia   . Heart murmur   . Hyperlipidemia   . Hypertension   . Malignant neoplasm of ascending colon ([place]) 2016   Minimally invasive right hemicolectomy to be done   . Neuromuscular disorder ([facility])    FIBROMYALGIA  . Ocular migraine   . OSA (obstructive sleep apnea) 09/2007   dx w/ a sleep study, not on  CPAP  . Osteoarthritis   . Osteoporosis   . Pneumonia    "double" in 2004  . PONV (postoperative nausea and vomiting)   . Reactive airway disease 01/29/2002   dx of pseudoasthma / vcd in 2005 and nl sprirometry History of dyspnea, 2011,  improved after several medications were changed around Question of COPD, disproved July 06, 2009 with nl pft's      . Rheumatoid factor positive   . Shingles 11/2009  . Sleep apnea   . Stroke ([place])   . TIA (transient ischemic attack)    x2 - on Plavix for this  . Torn rotator cuff    right worse than left, both are torn  . Tumor, 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  . Type II diabetes mellitus ([place])   . 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 Vaginal dysplasia     Past Surgical History:  Procedure Laterality Date  . ABDOMINAL HYSTERECTOMY  1980   NO oophorectomy per pt   . ANTERIOR CERVICAL DECOMP/DISCECTOMY FUSION  2001  C 3, C4 and C5 plate and screws  . BREAST BIOPSY Right 1999  . BUNIONECTOMY Left ~ 1977  . CATARACT EXTRACTION W/ INTRAOCULAR LENS  IMPLANT, BILATERAL  2012  . COLON SURGERY  10/2014  . EYE SURGERY Bilateral    torq lens for cataracts  . LEFT HEART CATH AND CORONARY ANGIOGRAPHY N/A 03/22/2016   Procedure: Left Heart Cath and Coronary Angiography;  Surgeon: Belva Crome, MD;  Location: [facility];  Service: Cardiovascular;  Laterality: N/A;  . THYROIDECTOMY, PARTIAL  1960's  . VAGINAL MASS EXCISION  1994   "Laser surgery for vaginal cancer; followed by chemotherapy" (06/27/2012)    Allergies  Allergen Reactions  . Bactrim [Sulfamethoxazole-Trimethoprim] Other (See Comments)     See OV 09-15-13, rash-tongue swelling due to bactrim ?  [name] Cefuroxime Axetil     REACTION: urticaria (hives)  . Oxycodone Nausea And Vomiting  . Pravastatin Other (See Comments)    "muscle breakdown" with profuse sweating  . Seldane [Terfenadine]     REACTION: urticaria (hives)  . Zocor [Simvastatin]     2012 "Muscle breakdown " with profuse sweating  . Tramadol Other (See Comments)    REACTION: vomitting  . Lime Flavor [Flavoring Agent] Diarrhea  . Tape Other (See Comments)    blisters    Family History  Problem Relation Age of Onset  . Heart disease Father   . Heart disease Mother   . Lung cancer Mother   . Allergies Sister   . Parkinsonism Sister        possible  . Asthma Sister   . Asthma Paternal Grandmother   . Stroke Paternal Grandmother   . Heart disease Unknown        paternal grandparents, maternal grandparents,   . Heart disease Brother   . Emphysema Brother   . Aneurysm Brother        x3  . Kidney failure Brother   . Diabetes Brother   . Diabetes Brother   . Stroke Brother   . Stroke Maternal Grandmother   . Breast cancer Neg Hx   . Colon cancer Neg Hx   . Heart attack Neg Hx      Social History   Socioeconomic History  . Marital status: Married    Spouse name: Ilona Sorrel  . Number of children: 2  . Years of education: masters  . Highest education level: Not on file  Occupational History  . Occupation: Retired, disable since 2000    Employer: RETIRED  Social Needs  . Financial resource strain: Not hard at all  . Food insecurity:    Worry: Never true    Inability: Never true  . Transportation needs:    Medical: No    Non-medical: No  Tobacco Use  . Smoking status: Former Smoker    Packs/day: 0.25    Years: 5.00    Pack years: 1.25    Types: Cigarettes    Last attempt to quit: 01/29/1998    Years since quitting: 19.7  . Smokeless tobacco: Never Used  . Tobacco comment: Quit in 2001  Substance and Sexual Activity  . Alcohol use: No     Alcohol/week: 0.0 standard drinks  . Drug use: No  . Sexual activity: Never  Lifestyle  . Physical activity:    Days per week: 0 days    Minutes per session: 0 min  . Stress: Very much  Relationships  . Social connections:    Talks on phone: More than three  times a week    Gets together: More than three times a week    Attends religious service: More than 4 times per year    Active member of club or organization: Yes    Attends meetings of clubs or organizations: More than 4 times per year    Relationship status: Married  . Intimate partner violence:    Fear of current or ex partner: No    Emotionally abused: No    Physically abused: No    Forced sexual activity: No  Other Topics Concern  . Not on file  Social History Narrative   On disability since 2000--- also husband has MS   Education. College.   Right handed.     PHYSICAL EXAM:  VS: BP 134/76   Pulse 99   Ht 5\' 2"  (1.575 m)   SpO2 99%   BMI 35.48 kg/m  Physical Exam Gen: NAD, alert, cooperative with exam, well-appearing ENT: normal lips, normal nasal mucosa,  Eye: normal EOM, normal conjunctiva and lids CV:  no edema, +2 pedal pulses   Resp: no accessory muscle use, non-labored,  Skin: no rashes, no areas of induration  Neuro: normal tone, normal sensation to touch Psych:  normal insight, alert and oriented MSK:  Back Exam:  Inspection: Unremarkable  Palpable tenderness: Mild tenderness to palpation of the left lower side paraspinal muscle and into the gluteal region.  No tenderness to palpation of the greater trochanter piriformis.  No tenderness palpation of the midline lumbar spine. Leg strength: Quad: 5/5 Hamstring: 5/5 Hip flexor: 5/5  Strength at foot: Plantar-flexion: 5/5 Dorsi-flexion: 5/5 Eversion: 5/5 Inversion: 5/5  Walks with a cane for support. SLR laying: Negative  Neurovascularly intact       ASSESSMENT & PLAN:   Acute left-sided low back pain without sciatica Has an acute change most  likely related to the spasm.  Has underlying facet arthritis as well as SI joint arthritis on the same side. -Try prednisone.  Most recent A1c was 7.0. -Counseled on supportive care. -Counseled on home exercise therapy. -If no improvement can consider trigger point injections and physical therapy.

## 2017-11-07 NOTE — Telephone Encounter (Signed)
Copied from [place] [id number]. Topic: Quick Communication - See Telephone Encounter >> Nov 07, 2017 10:03 AM Gardiner Ramus wrote: CRM for notification. See Telephone encounter for: 11/07/17. Pt called nad stated that she is in server pain. Pt states the pain is in her back and shooting down her legs. Pt states that she was seen 2 weeks ago and discussed issue. Please advise [phone]

## 2017-11-07 NOTE — Telephone Encounter (Signed)
Dr. Quay Burow prefers for her to see Dr. Tamala Julian.

## 2017-11-07 NOTE — Telephone Encounter (Signed)
Would you like for patient to come back in to see Dr.Burns or see sports med?

## 2017-11-08 ENCOUNTER — Other Ambulatory Visit: Payer: Self-pay | Admitting: Cardiology

## 2017-11-08 ENCOUNTER — Ambulatory Visit (INDEPENDENT_AMBULATORY_CARE_PROVIDER_SITE_OTHER): Payer: Medicare Other | Admitting: Family Medicine

## 2017-11-08 ENCOUNTER — Encounter: Payer: Self-pay | Admitting: Family Medicine

## 2017-11-08 VITALS — BP 134/76 | HR 99 | Ht 62.0 in

## 2017-11-08 DIAGNOSIS — M545 Low back pain, unspecified: Secondary | ICD-10-CM

## 2017-11-08 DIAGNOSIS — E785 Hyperlipidemia, unspecified: Secondary | ICD-10-CM

## 2017-11-08 DIAGNOSIS — E11311 Type 2 diabetes mellitus with unspecified diabetic retinopathy with macular edema: Secondary | ICD-10-CM

## 2017-11-08 DIAGNOSIS — Z794 Long term (current) use of insulin: Secondary | ICD-10-CM

## 2017-11-08 DIAGNOSIS — I10 Essential (primary) hypertension: Secondary | ICD-10-CM

## 2017-11-08 MED ORDER — PREDNISONE 5 MG PO TABS: ORAL_TABLET | ORAL | 0 refills | Status: DC

## 2017-11-08 NOTE — Patient Instructions (Signed)
Nice to meet you  Please try heat and massage over the lower back  Please try the exercises  Please follow up with me at the [facility] location if your pain doesn't improve.

## 2017-11-10 DIAGNOSIS — M545 Low back pain, unspecified: Secondary | ICD-10-CM | POA: Insufficient documentation

## 2017-11-10 NOTE — Assessment & Plan Note (Signed)
Has an acute change most likely related to the spasm.  Has underlying facet arthritis as well as SI joint arthritis on the same side. -Try prednisone.  Most recent A1c was 7.0. -Counseled on supportive care. -Counseled on home exercise therapy. -If no improvement can consider trigger point injections and physical therapy.

## 2017-11-25 ENCOUNTER — Telehealth (INDEPENDENT_AMBULATORY_CARE_PROVIDER_SITE_OTHER): Payer: Self-pay | Admitting: Orthopaedic Surgery

## 2017-11-25 NOTE — Telephone Encounter (Signed)
Patient called and states she is in extreme pain with her back. Patient states she can hear bones rubbing together as she walks, she also expressed how she has taken Tylenol and it hasn't helped and the pain has started to move down her leg.  I offered patient an apt for the 12th of Nov, the stated that she couldn't wait that long.  Patients # [phone]

## 2017-11-26 ENCOUNTER — Encounter: Payer: Self-pay | Admitting: Internal Medicine

## 2017-11-26 ENCOUNTER — Ambulatory Visit (INDEPENDENT_AMBULATORY_CARE_PROVIDER_SITE_OTHER): Payer: Medicare Other | Admitting: Internal Medicine

## 2017-11-26 DIAGNOSIS — I2510 Atherosclerotic heart disease of native coronary artery without angina pectoris: Secondary | ICD-10-CM | POA: Diagnosis not present

## 2017-11-26 DIAGNOSIS — M4807 Spinal stenosis, lumbosacral region: Secondary | ICD-10-CM

## 2017-11-26 MED ORDER — HYDROCODONE-ACETAMINOPHEN 5-325 MG PO TABS: 1.0000 | ORAL_TABLET | Freq: Four times a day (QID) | ORAL | 0 refills | Status: DC | PRN

## 2017-11-26 MED ORDER — METHYLPREDNISOLONE ACETATE 80 MG/ML IJ SUSP
80.0000 mg | Freq: Once | INTRAMUSCULAR | Status: AC
  Administered 2017-11-26: 80 mg via INTRAMUSCULAR

## 2017-11-26 NOTE — Telephone Encounter (Signed)
appt made

## 2017-11-26 NOTE — Assessment & Plan Note (Addendum)
Pain is severe in nature, 10/10 and persistent for at least 6 weeks She is radiculopathy-pain in both legs, subjective weakness and numbness/tingling in her feet and ankles MRI from 2005 shows moderate spinal stenosis and with her current symptoms I am concerned about severe spinal stenosis possibly secondary to disc herniation compressing the spine MRI ordered She did receive some oral steroids when she saw sports medicine, but this is a fairly low dose-we will try Medrol Dosepak 80 mg IM x1 given the severity of pain Hydrocodone-acetaminophen for pain Already on Lyrica Continue heating pad Has an appointment with Dr. Lorin Mercy for 11/12-we will forward him results of MRI

## 2017-11-26 NOTE — Telephone Encounter (Signed)
Make appt for 12th. I called her and discussed. I have not seen her since last year. No bowel or bladder symptoms. She can talk with docs she has already seen and see if they think she needs MRI scan .

## 2017-11-26 NOTE — Telephone Encounter (Signed)
Please advise. Is there anything else that can be offered to the patient until she is seen in the office?  She has already seen her PCP and a sports medicine doctor (per notes in the chart). We do not have any appointments available until 12/10/17.

## 2017-11-26 NOTE — Patient Instructions (Addendum)
A steroid injection was given today.   An MRI was ordered - someone will call you to schedule this.    Start hydrocodone as needed for your pain.   We will call you with the results of the MRI.

## 2017-11-26 NOTE — Progress Notes (Signed)
Subjective:    Patient ID: Samantha [name], female    DOB: Apr 02, 1940, 77 y.o.   MRN: [id number]  HPI The patient is here for an acute visit.  Back pain:  She continues to have back pain.  The back pain started at least 6 weeks ago.  She has pain across the lower back and down both legs.  The leg pain is persistent.  She has a little numbness/tingling in the feet and ankles.  Her pain is 10/10.  She saw Dr Raeford Razor on 10/11 and he prescribed prednisone and it did not help.  She tried the back exercises and they made it worse.  She has bone crunching in her back.  She is having urinary frequency and she can not get to the bathroom fast enough due to the back pain.  She denies fecal incontinence.    Tylenol is not helping and she has taken too much.  She is using a heating pad.   The only pain medication that she is able to take is hydrocodone-acetaminophen.  She sees Dr Lorin Mercy on 12/10/17.     Medications and allergies reviewed with patient and updated if appropriate.  Patient Active Problem List   Diagnosis Date Noted  . Spinal stenosis of lumbosacral region 11/26/2017  . Acute left-sided low back pain without sciatica 11/10/2017  . GERD (gastroesophageal reflux disease) 10/23/2017  . Candidiasis of skin 10/16/2017  . Sacral back pain 10/16/2017  . Epistaxis 07/23/2017  . Headache 07/23/2017  . Chronic left upper quadrant pain 07/23/2017  . Rectal bleeding 07/23/2017  . Poor balance 03/14/2017  . Sprain of right ankle 03/14/2017  . Right shoulder pain 03/14/2017  . Hair loss 10/19/2016  . Difficulty urinating 04/18/2016  . Angina pectoris ([place]) 03/22/2016  . Spondylosis of lumbar region without myelopathy or radiculopathy 02/02/2016  . CAD in native artery 08/22/2015  . Hypertensive heart disease 07/07/2015  . Essential hypertension 07/07/2015  . SOB (shortness of breath) 07/07/2015  . Dizziness 07/07/2015  . Plantar fasciitis, left 03/22/2015  . Coronary artery disease  due to lipid rich plaque 01/05/2015  . Chronic diastolic CHF (congestive heart failure), NYHA class 2 ([place]) 01/05/2015  . 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 Migraine (Ocular) 01/05/2014  . Gait difficulty 10/06/2013  . Pain in joint, shoulder region 11/27/2012  . VBI (vertebrobasilar insufficiency) 08/22/2012  . Right knee pain 07/29/2012  . TIA (transient ischemic attack) 06/27/2012  . Edema 02/01/2012  . DJD (degenerative joint disease) 02/02/2011  . 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 Postherpetic neuralgia ? 01/02/2010  . NECK PAIN 10/05/2009  . Palpitations 11/23/2008  . UTI'S, RECURRENT 09/28/2008  . Fibromyalgia 08/15/2007  . 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 Hyperlipidemia 05/21/2006  . Reactive airway disease 01/29/2002    Current Outpatient Medications on File Prior to Visit  Medication Sig Dispense Refill  . acetaminophen (TYLENOL) 500 MG tablet Take 1,000 mg by mouth every 6 (six) hours as needed for moderate pain.    [name] albuterol (PROVENTIL HFA;VENTOLIN HFA) 108 (90 Base) MCG/ACT inhaler Inhale 2 puffs into the lungs every 6 (six) hours as needed for wheezing or shortness of breath. 18 g 3  . atorvastatin (LIPITOR) 20 MG tablet TAKE 1 TABLET BY MOUTH EVERY DAY 90 tablet 2  . budesonide-formoterol (SYMBICORT) 80-4.5 MCG/ACT inhaler Inhale 2 puffs into the lungs 2 (two) times daily. 1 Inhaler 3  .  butalbital-acetaminophen-caffeine (FIORICET) 50-325-40 MG tablet Take 1 tablet by mouth every 6 (six) hours as needed for headache or migraine. 30 tablet 5  . CALCIUM PO Take 1 tablet by mouth every morning.     . cetirizine (ZYRTEC) 10 MG tablet Take 1 tablet (10 mg total) by mouth daily. 90 tablet 1  . cholecalciferol (VITAMIN D) 1000 UNITS tablet Take 1,000 Units by mouth every morning.     . clopidogrel (PLAVIX) 75 MG tablet TAKE 1 TABLET(75 MG) BY MOUTH DAILY 90 tablet 1  .  hydrochlorothiazide (HYDRODIURIL) 25 MG tablet Take 0.5 tablets (12.5 mg total) by mouth daily. 90 tablet 1  . hydrocortisone 2.5 % cream Apply topically 2 (two) times daily. (Patient taking differently: Apply 1 application topically 2 (two) times daily as needed (for rash). ) 30 g 0  . isosorbide mononitrate (IMDUR) 30 MG 24 hr tablet Take 1 tablet (30 mg total) by mouth daily. 90 tablet 1  . lisinopril (PRINIVIL,ZESTRIL) 40 MG tablet Take 1 tablet (40 mg total) by mouth daily. 90 tablet 1  . LYRICA 100 MG capsule TAKE 1 CAPSULE BY MOUTH EVERY DAY AS NEEDED FOR PAIN 30 capsule 0  . metFORMIN (GLUCOPHAGE-XR) 500 MG 24 hr tablet TAKE 1 TABLET(500 MG) BY MOUTH DAILY WITH BREAKFAST 90 tablet 1  . Multiple Vitamin (MULTIVITAMIN) capsule Take 1 capsule by mouth daily.      . nebivolol (BYSTOLIC) 10 MG tablet Take 1 tablet (10 mg total) by mouth daily. 90 tablet 1  . nitroGLYCERIN (NITROSTAT) 0.4 MG SL tablet Place 0.4 mg under the tongue every 5 (five) minutes as needed for chest pain (x 3 doses).    . nystatin cream (MYCOSTATIN) Apply 1 application topically 2 (two) times daily. 30 g 5  . Polyethyl Glycol-Propyl Glycol (SYSTANE) 0.4-0.3 % GEL Place 1 drop into both eyes daily as needed (dry eyes).     . Potassium 99 MG TABS Take 99 mg by mouth daily.     . promethazine (PHENERGAN) 25 MG tablet Take 1 tablet (25 mg total) by mouth every 8 (eight) hours as needed for nausea or vomiting. 20 tablet 0  . ranitidine (ZANTAC) 300 MG tablet TAKE 1 TABLET(300 MG) BY MOUTH AT BEDTIME 90 tablet 0   No current facility-administered medications on file prior to visit.     Past Medical History:  Diagnosis Date  . Abnormal CT of the chest 2008   last CT4-l 2009:  . No f/u suggested   . Allergy   . Asthma   . CAD (coronary artery disease)    a. Coronary CTA 10/16: Coronary Ca score 211, mod non-obstructive CAD with LM mild plaque (25-50%), mid LAD 50-69%. b. Neg nuc 06/2015.  . Cataract    BILATERAL-REMOVED    . Chronic diastolic CHF (congestive heart failure) ([place])   . Collagen vascular disease ([place])    "arterial sclerosis" per pt  . Complication of anesthesia    trouble waking up  . Fibromyalgia   . GERD (gastroesophageal reflux disease)   . H/O hiatal hernia   . Heart murmur   . Hyperlipidemia   . Hypertension   . Malignant neoplasm of ascending colon ([facility]) 2016   Minimally invasive right hemicolectomy to be done   . Neuromuscular disorder ([facility])    FIBROMYALGIA  . Ocular migraine   . OSA (obstructive sleep apnea) 09/2007   dx w/ a sleep study, not on  CPAP  . Osteoarthritis   . Osteoporosis   .  Pneumonia    "double" in 2004  . PONV (postoperative nausea and vomiting)   . Reactive airway disease 01/29/2002   dx of pseudoasthma / vcd in 2005 and nl sprirometry History of dyspnea, 2011,  improved after several medications were changed around Question of COPD, disproved July 06, 2009 with nl pft's      . Rheumatoid factor positive   . Shingles 11/2009  . Sleep apnea   . Stroke ([place] [place])   . TIA (transient ischemic attack)    x2 - on Plavix for this  . Torn rotator cuff    right worse than left, both are torn  . Tumor, 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  . Type II diabetes mellitus ([place])   . 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 Vaginal dysplasia     Past Surgical History:  Procedure Laterality Date  . ABDOMINAL HYSTERECTOMY  1980   NO oophorectomy per pt   . ANTERIOR CERVICAL DECOMP/DISCECTOMY FUSION  2001   C 3, C4 and C5 plate and screws  . BREAST BIOPSY Right 1999  . BUNIONECTOMY Left ~ 1977  . CATARACT EXTRACTION W/ INTRAOCULAR LENS  IMPLANT, BILATERAL  2012  . COLON SURGERY  10/2014  . EYE SURGERY Bilateral    torq lens for cataracts  . LEFT HEART CATH AND CORONARY ANGIOGRAPHY N/A 03/22/2016   Procedure: Left Heart Cath and Coronary Angiography;  Surgeon: Belva Crome, MD;  Location: [facility];  Service: Cardiovascular;  Laterality: N/A;  . THYROIDECTOMY,  PARTIAL  1960's  . VAGINAL MASS EXCISION  1994   "Laser surgery for vaginal cancer; followed by chemotherapy" (06/27/2012)    Social History   Socioeconomic History  . Marital status: Married    Spouse name: Ilona Sorrel  . Number of children: 2  . Years of education: masters  . Highest education level: Not on file  Occupational History  . Occupation: Retired, disable since 2000    Employer: RETIRED  Social Needs  . Financial resource strain: Not hard at all  . Food insecurity:    Worry: Never true    Inability: Never true  . Transportation needs:    Medical: No    Non-medical: No  Tobacco Use  . Smoking status: Former Smoker    Packs/day: 0.25    Years: 5.00    Pack years: 1.25    Types: Cigarettes    Last attempt to quit: 01/29/1998    Years since quitting: 19.8  . Smokeless tobacco: Never Used  . Tobacco comment: Quit in 2001  Substance and Sexual Activity  . Alcohol use: No    Alcohol/week: 0.0 standard drinks  . Drug use: No  . Sexual activity: Never  Lifestyle  . Physical activity:    Days per week: 0 days    Minutes per session: 0 min  . Stress: Very much  Relationships  . Social connections:    Talks on phone: More than three times a week    Gets together: More than three times a week    Attends religious service: More than 4 times per year    Active member of club or organization: Yes    Attends meetings of clubs or organizations: More than 4 times per year    Relationship status: Married  Other Topics Concern  . Not on file  Social History Narrative   On disability since 2000--- also husband has MS   Education. College.   Right handed.  Family History  Problem Relation Age of Onset  . Heart disease Father   . Heart disease Mother   . Lung cancer Mother   . Allergies Sister   . Parkinsonism Sister        possible  . Asthma Sister   . Asthma Paternal Grandmother   . Stroke Paternal Grandmother   . Heart disease Unknown        paternal  grandparents, maternal grandparents,   . Heart disease Brother   . Emphysema Brother   . Aneurysm Brother        x3  . Kidney failure Brother   . Diabetes Brother   . Diabetes Brother   . Stroke Brother   . Stroke Maternal Grandmother   . Breast cancer Neg Hx   . Colon cancer Neg Hx   . Heart attack Neg Hx     Review of Systems  Constitutional: Negative for chills and fever.  Gastrointestinal:       No fecal incontinence  Genitourinary:       Incontinence, which is likely secondary to not being able to get to the bathroom quick enough  Musculoskeletal: Positive for back pain. Negative for myalgias (No muscle spasms in back).  Neurological: Positive for weakness (Bilateral legs) and numbness (Numbness/tingling feet and ankles).       Objective:   Vitals:   11/26/17 1432  BP: (!) 154/78  Pulse: 63  Resp: 16  Temp: 98.3 F (36.8 C)  SpO2: 96%   BP Readings from Last 3 Encounters:  11/26/17 (!) 154/78  11/08/17 134/76  10/23/17 120/64   Wt Readings from Last 3 Encounters:  11/26/17 193 lb 12.8 oz (87.9 kg)  10/23/17 194 lb (88 kg)  10/16/17 196 lb (88.9 kg)   Body mass index is 35.45 kg/m.   Physical Exam  Constitutional: She appears well-developed and well-nourished. No distress.  HENT:  Head: Normocephalic and atraumatic.  Musculoskeletal: She exhibits tenderness (Across lower back with palpation). She exhibits no edema or deformity (No lumbar deformity).  Neurological: She displays normal reflexes. A sensory deficit (Decreased sensation bilateral lower extremities) is present.  Difficult to truly test strength in lower extremities given severity of pain, modified straight leg raise positive  Skin: Skin is warm and dry. She is not diaphoretic.           Assessment & Plan:    See Problem List for Assessment and Plan of chronic medical problems.

## 2017-12-04 ENCOUNTER — Ambulatory Visit
Admission: RE | Admit: 2017-12-04 | Discharge: 2017-12-04 | Disposition: A | Discharge status: Home / Self Care | Payer: Medicare Other | Source: Ambulatory Visit | Attending: Internal Medicine | Admitting: Internal Medicine

## 2017-12-04 DIAGNOSIS — M4807 Spinal stenosis, lumbosacral region: Secondary | ICD-10-CM

## 2017-12-04 DIAGNOSIS — M48061 Spinal stenosis, lumbar region without neurogenic claudication: Secondary | ICD-10-CM | POA: Diagnosis not present

## 2017-12-05 ENCOUNTER — Encounter: Payer: Self-pay | Admitting: Internal Medicine

## 2017-12-10 ENCOUNTER — Ambulatory Visit (INDEPENDENT_AMBULATORY_CARE_PROVIDER_SITE_OTHER): Payer: Medicare Other | Admitting: Orthopaedic Surgery

## 2017-12-10 ENCOUNTER — Ambulatory Visit (INDEPENDENT_AMBULATORY_CARE_PROVIDER_SITE_OTHER): Payer: Medicare Other

## 2017-12-10 ENCOUNTER — Encounter (INDEPENDENT_AMBULATORY_CARE_PROVIDER_SITE_OTHER): Payer: Self-pay | Admitting: Orthopaedic Surgery

## 2017-12-10 VITALS — BP 157/91 | HR 72 | Ht 62.0 in | Wt 191.0 lb

## 2017-12-10 DIAGNOSIS — M545 Low back pain, unspecified: Secondary | ICD-10-CM

## 2017-12-10 DIAGNOSIS — I2510 Atherosclerotic heart disease of native coronary artery without angina pectoris: Secondary | ICD-10-CM

## 2017-12-10 DIAGNOSIS — G8929 Other chronic pain: Secondary | ICD-10-CM

## 2017-12-10 DIAGNOSIS — M48062 Spinal stenosis, lumbar region with neurogenic claudication: Secondary | ICD-10-CM

## 2017-12-10 NOTE — Progress Notes (Signed)
Office Visit Note   Patient: Samantha [name]           Date of Birth: 1940/10/18           MRN: [id number] Visit Date: 12/10/2017              Requested by: [name], MD [place], [place] [id number] PCP: [name], MD   Assessment & Plan: Visit Diagnoses:  1. Chronic bilateral low back pain, unspecified whether sciatica present   2. Spinal stenosis of lumbar region with neurogenic claudication     Plan: Patient has grade 1 anterolisthesis stable on flexion-extension with severe spinal stenosis with mild left and moderate right foraminal narrowing.  Her principal problem is been claudication symptoms with standing and walking.  She has some mild narrowing at other levels including L1 to, T10-11 and also L3-4 without significant compression at these levels.  She is having trouble with community ambulation and recommendation would be single level decompression at the L4-5 level.  She would need preoperative clearance.  Surgical procedure discussed questions elicited and answered.  Plan will be overnight stay postoperatively.  Follow-Up Instructions:   Orders:  Orders Placed This Encounter  Procedures  . XR Lumb Spine Flex&Ext Only   No orders of the defined types were placed in this encounter.     Procedures: No procedures performed   Clinical Data: No additional findings.   Subjective: Chief Complaint  Patient presents with  . Lower Back - Pain    HPI 77 year old female is had extreme pain in her back for more than a month.  MRI scan of been obtained and is available for review on PACS.  She states when she gets up ambulates and turns she has crunching in her back and occasionally has electrical pain that radiates down her legs into her knees.  She ambulates with a cane states she does have some pain with sitting but gets better after sitting for a while.  Is been taking hydrocodone for extended period of time.  She states that she can stand for  fairly long length of time and does get relief when she sits down.  She does better leaning on a grocery cart and has problems ambulating if she cannot lean on the cart.  Previous problems with her right shoulder with severe tendinosis of the supraspinatus with partial rotator cuff tearing.  She had complete tear long head of the biceps tendon by MRI in March 2019.  Review of Systems is systems are updated unchanged from 04/10/2017 office visit with Dr. Marlou Sa other than as mentioned in HPI.  She has type 2 diabetes history of chronic congestive heart failure class II, hypertension, GERD, history: CA 2016.   Objective: Vital Signs: BP (!) 157/91   Pulse 72   Ht 5\' 2"  (1.575 m)   Wt 191 lb (86.6 kg)   BMI 34.93 kg/m   Physical Exam  Constitutional: She is oriented to person, place, and time. She appears well-developed.  HENT:  Head: Normocephalic.  Right Ear: External ear normal.  Left Ear: External ear normal.  Eyes: Pupils are equal, round, and reactive to light.  Neck: No tracheal deviation present. No thyromegaly present.  Cardiovascular: Normal rate.  Pulmonary/Chest: Effort normal.  Abdominal: Soft.  Neurological: She is alert and oriented to person, place, and time.  Skin: Skin is warm and dry.  Psychiatric: She has a normal mood and affect. Her behavior is normal.  Ortho Exam patient has intact anterior tib gastrocsoleus negative logroll to her hips.  She ambulates with a cane.  Distal pulses are palpable.  She has slight edema lower extremities. Specialty Comments:  No specialty comments available.  Imaging: CLINICAL DATA:  Low back pain radiating to bilateral lower extremities fell September 2019. History of colon cancer, vaginal cancer, ACDF.  EXAM: MRI LUMBAR SPINE WITHOUT CONTRAST  TECHNIQUE: Multiplanar, multisequence MR imaging of the lumbar spine was performed. No intravenous contrast was administered.  COMPARISON:  CT abdomen and pelvis July 20, 2017  FINDINGS: SEGMENTATION: For the purposes of this report, the last well-formed intervertebral disc is reported as L5-S1.  ALIGNMENT: Maintained lumbar lordosis. Stable grade 1 L1-2 retrolisthesis and grade 1 L4-5 anterolisthesis. No spondylolysis.  VERTEBRAE:Vertebral bodies are intact. Multilevel moderate disc height loss, relative sparing L4-5 diffuse mild disc desiccation. Moderate to severe subacute discogenic endplate changes F0-Y7. Moderate acute discogenic endplate changes X41-O8, L2-3, L3-4. Confluent low signal about the LEFT sacroiliac joint corresponding to sclerosis and osteoarthrosis on prior CT.  CONUS MEDULLARIS AND CAUDA EQUINA: Conus medullaris terminates at L1-2 and demonstrates normal morphology and signal characteristics. Cauda equina is normal.  PARASPINAL AND OTHER SOFT TISSUES: Included prevertebral and paraspinal soft tissues are nonacute. T2 bright cyst RIGHT kidney. Mild symmetric paraspinal muscle atrophy.  DISC LEVELS:  T10-11: Small broad-based disc bulge, mild facet arthropathy and ligamentum flavum redundancy. Mild canal stenosis. Moderate RIGHT neural foraminal narrowing.  T11-12: Small broad-based disc bulge asymmetric to LEFT. Moderate LEFT facet arthropathy. No canal stenosis. Severe LEFT neural foraminal narrowing.  T12-L1: Small broad-based disc bulge and moderate LEFT subarticular extraforaminal disc protrusion may affect the exited LEFT T12 nerve. Mild to moderate facet arthropathy and ligamentum flavum redundancy without canal stenosis. Moderate to severe LEFT neural foraminal narrowing.  L1-2: Retrolisthesis. Small broad based disc bulge, small LEFT subarticular extraforaminal disc protrusion. Moderate facet arthropathy and ligamentum flavum redundancy with trace LEFT facet effusion. Mild canal stenosis. Moderate RIGHT and moderate to severe LEFT neural foraminal narrowing.  L2-3: Moderate broad-based disc bulge.  Moderate facet arthropathy and ligamentum flavum redundancy, trace facet effusions. Mild canal stenosis. Mild RIGHT neural foraminal narrowing. Small broad-based RIGHT extraforaminal disc protrusion could affect the exited RIGHT L2 nerve.  L3-4: Moderate broad-based disc bulge, small broad-based RIGHT extraforaminal disc protrusion may affect the exited RIGHT L3 nerve. Moderate to severe RIGHT and mild LEFT facet arthropathy with ligamentum flavum redundancy. Epidural fat resulting in mildly narrowed thecal sac and mild canal stenosis. Moderate RIGHT and mild LEFT neural foraminal narrowing.  L4-5: Anterolisthesis. Unroofing of small broad-based disc bulge. Severe facet arthropathy and ligamentum flavum redundancy with trace reactive facet effusions. Severe canal stenosis. Moderate RIGHT and mild LEFT neural foraminal narrowing.  L5-S1: Small broad-based disc bulge. Moderate RIGHT and moderate to severe LEFT facet arthropathy. No canal stenosis. Mild LEFT neural foraminal narrowing.  IMPRESSION: 1. Grade 1 L1-2 retrolisthesis and grade 1 L4-5 anterolisthesis without spondylolysis. No fracture. 2. Severe canal stenosis L4-5. Mild canal stenosis T10-11, L1-2 through L3-4. 3. Neural foraminal narrowing all levels: Severe on the LEFT at T11-12. 4. Multilevel extraforaminal disc protrusions may affect the exited LEFT T12, RIGHT L2 and RIGHT L3 nerves.   Electronically Signed   By: Elon Alas M.D.   On: 12/04/2017 15:38    PMFS History: Patient Active Problem List   Diagnosis Date Noted  . Spinal stenosis of lumbar region 12/11/2017  . Spinal stenosis of lumbosacral region 11/26/2017  . Acute  left-sided low back pain without sciatica 11/10/2017  . GERD (gastroesophageal reflux disease) 10/23/2017  . Candidiasis of skin 10/16/2017  . Sacral back pain 10/16/2017  . Epistaxis 07/23/2017  . Headache 07/23/2017  . Chronic left upper quadrant pain 07/23/2017  .  Rectal bleeding 07/23/2017  . Poor balance 03/14/2017  . Sprain of right ankle 03/14/2017  . Right shoulder pain 03/14/2017  . Hair loss 10/19/2016  . Difficulty urinating 04/18/2016  . Angina pectoris ([place]) 03/22/2016  . Spondylosis of lumbar region without myelopathy or radiculopathy 02/02/2016  . CAD in native artery 08/22/2015  . Hypertensive heart disease 07/07/2015  . Essential hypertension 07/07/2015  . SOB (shortness of breath) 07/07/2015  . Dizziness 07/07/2015  . Plantar fasciitis, left 03/22/2015  . Coronary artery disease due to lipid rich plaque 01/05/2015  . Chronic diastolic CHF (congestive heart failure), NYHA class 2 ([place]) 01/05/2015  . 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 Migraine ([place]) 01/05/2014  . Gait difficulty 10/06/2013  . Pain in joint, shoulder region 11/27/2012  . VBI (vertebrobasilar insufficiency) 08/22/2012  . Right knee pain 07/29/2012  . TIA (transient ischemic attack) 06/27/2012  . Edema 02/01/2012  . DJD (degenerative joint disease) 02/02/2011  . 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 Postherpetic neuralgia ? 01/02/2010  . NECK PAIN 10/05/2009  . Palpitations 11/23/2008  . UTI'S, RECURRENT 09/28/2008  . Fibromyalgia 08/15/2007  . 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 Hyperlipidemia 05/21/2006  . Reactive airway disease 01/29/2002   Past Medical History:  Diagnosis Date  . Abnormal CT of the chest 2008   last CT4-l 2009:  . No f/u suggested   . Allergy   . Asthma   . CAD (coronary artery disease)    a. Coronary CTA 10/16: Coronary Ca score 211, mod non-obstructive CAD with LM mild plaque (25-50%), mid LAD 50-69%. b. Neg nuc 06/2015.  . Cataract    BILATERAL-REMOVED  . Chronic diastolic CHF (congestive heart failure) ([place])   . Collagen vascular disease ([place])    "arterial sclerosis" per pt  . Complication of anesthesia    trouble waking up  .  Fibromyalgia   . GERD (gastroesophageal reflux disease)   . H/O hiatal hernia   . Heart murmur   . Hyperlipidemia   . Hypertension   . Malignant neoplasm of ascending colon ([place]) 2016   Minimally invasive right hemicolectomy to be done   . Neuromuscular disorder ([facility])    FIBROMYALGIA  . Ocular migraine   . OSA (obstructive sleep apnea) 09/2007   dx w/ a sleep study, not on  CPAP  . Osteoarthritis   . Osteoporosis   . Pneumonia    "double" in 2004  . PONV (postoperative nausea and vomiting)   . Reactive airway disease 01/29/2002   dx of pseudoasthma / vcd in 2005 and nl sprirometry History of dyspnea, 2011,  improved after several medications were changed around Question of COPD, disproved July 06, 2009 with nl pft's      . Rheumatoid factor positive   . Shingles 11/2009  . Sleep apnea   . Stroke ([place])   . TIA (transient ischemic attack)    x2 - on Plavix for this  . Torn rotator cuff    right worse than left, both are torn  . Tumor, 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  . Type II diabetes mellitus ([place])   .  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 Vaginal dysplasia     Family History  Problem Relation Age of Onset  . Heart disease Father   . Heart disease Mother   . Lung cancer Mother   . Allergies Sister   . Parkinsonism Sister        possible  . Asthma Sister   . Asthma Paternal Grandmother   . Stroke Paternal Grandmother   . Heart disease Unknown        paternal grandparents, maternal grandparents,   . Heart disease Brother   . Emphysema Brother   . Aneurysm Brother        x3  . Kidney failure Brother   . Diabetes Brother   . Diabetes Brother   . Stroke Brother   . Stroke Maternal Grandmother   . Breast cancer Neg Hx   . Colon cancer Neg Hx   . Heart attack Neg Hx     Past Surgical History:  Procedure Laterality Date  . ABDOMINAL HYSTERECTOMY  1980   NO oophorectomy per pt   . ANTERIOR CERVICAL DECOMP/DISCECTOMY FUSION  2001   C 3, C4 and C5 plate and  screws  . BREAST BIOPSY Right 1999  . BUNIONECTOMY Left ~ 1977  . CATARACT EXTRACTION W/ INTRAOCULAR LENS  IMPLANT, BILATERAL  2012  . COLON SURGERY  10/2014  . EYE SURGERY Bilateral    torq lens for cataracts  . LEFT HEART CATH AND CORONARY ANGIOGRAPHY N/A 03/22/2016   Procedure: Left Heart Cath and Coronary Angiography;  Surgeon: Belva Crome, MD;  Location: [facility];  Service: Cardiovascular;  Laterality: N/A;  . THYROIDECTOMY, PARTIAL  1960's  . VAGINAL MASS EXCISION  1994   "Laser surgery for vaginal cancer; followed by chemotherapy" (06/27/2012)   Social History   Occupational History  . Occupation: Retired, disable since 2000    Employer: RETIRED  Tobacco Use  . Smoking status: Former Smoker    Packs/day: 0.25    Years: 5.00    Pack years: 1.25    Types: Cigarettes    Last attempt to quit: 01/29/1998    Years since quitting: 19.8  . Smokeless tobacco: Never Used  . Tobacco comment: Quit in 2001  Substance and Sexual Activity  . Alcohol use: No    Alcohol/week: 0.0 standard drinks  . Drug use: No  . Sexual activity: Never

## 2017-12-11 ENCOUNTER — Encounter (INDEPENDENT_AMBULATORY_CARE_PROVIDER_SITE_OTHER): Payer: Self-pay | Admitting: Orthopaedic Surgery

## 2017-12-11 ENCOUNTER — Other Ambulatory Visit: Payer: Self-pay | Admitting: Internal Medicine

## 2017-12-11 DIAGNOSIS — R5383 Other fatigue: Secondary | ICD-10-CM

## 2017-12-11 DIAGNOSIS — R0602 Shortness of breath: Secondary | ICD-10-CM

## 2017-12-11 DIAGNOSIS — I5032 Chronic diastolic (congestive) heart failure: Secondary | ICD-10-CM

## 2017-12-11 DIAGNOSIS — R42 Dizziness and giddiness: Secondary | ICD-10-CM

## 2017-12-11 DIAGNOSIS — I2510 Atherosclerotic heart disease of native coronary artery without angina pectoris: Secondary | ICD-10-CM

## 2017-12-11 DIAGNOSIS — M48061 Spinal stenosis, lumbar region without neurogenic claudication: Secondary | ICD-10-CM | POA: Insufficient documentation

## 2017-12-11 DIAGNOSIS — I10 Essential (primary) hypertension: Secondary | ICD-10-CM

## 2017-12-11 NOTE — Telephone Encounter (Signed)
Last OV 10/23/17  [place] Controlled Substance Database checked. Last filled on 08/28/17. 10/19/17 RX not filled

## 2017-12-16 ENCOUNTER — Telehealth: Payer: Self-pay

## 2017-12-16 ENCOUNTER — Telehealth: Payer: Self-pay | Admitting: *Deleted

## 2017-12-16 ENCOUNTER — Ambulatory Visit (INDEPENDENT_AMBULATORY_CARE_PROVIDER_SITE_OTHER): Payer: Medicare Other | Admitting: Cardiology

## 2017-12-16 ENCOUNTER — Encounter: Payer: Self-pay | Admitting: *Deleted

## 2017-12-16 VITALS — BP 164/68 | HR 62 | Ht 62.0 in | Wt 193.0 lb

## 2017-12-16 DIAGNOSIS — E782 Mixed hyperlipidemia: Secondary | ICD-10-CM

## 2017-12-16 DIAGNOSIS — I2510 Atherosclerotic heart disease of native coronary artery without angina pectoris: Secondary | ICD-10-CM

## 2017-12-16 DIAGNOSIS — I119 Hypertensive heart disease without heart failure: Secondary | ICD-10-CM | POA: Diagnosis not present

## 2017-12-16 DIAGNOSIS — Q613 Polycystic kidney, unspecified: Secondary | ICD-10-CM | POA: Diagnosis not present

## 2017-12-16 DIAGNOSIS — Z01818 Encounter for other preprocedural examination: Secondary | ICD-10-CM | POA: Diagnosis not present

## 2017-12-16 MED ORDER — ISOSORBIDE MONONITRATE ER 60 MG PO TB24: 60.0000 mg | ORAL_TABLET | Freq: Every day | ORAL | 3 refills | Status: DC

## 2017-12-16 NOTE — Progress Notes (Signed)
Cardiology Office Note    Date:  12/16/2017   ID:  Samantha [name], Mowbray 1941/01/11, MRN [id number]  PCP:  [name], MD  Cardiologist:  [name]  CC: Hypertension, preoperative evaluation  History of Present Illness:  Samantha [name] is a 77 y.o. female with a history of CAD (moderate nonobstructive disease by cardiac CT 10/2014), TIA (on Plavix), chronic diastolic CHF, DM2, HTN, HL, fibromyalgia, palpitations secondary to symptomatic PVCs, LE edema, colon cancer (s/p lap partial colectomy 10/2014), reactive airway disease who presents to clinic for evaluation of chest pressure. Cardiac catheterization performed in February 2018 showed 40% mid RCA stenosis and 50% mid LAD stenosis. The patient continued to have intermittent exertional and resting chest pain that has resolved after she was started on Imdur 30 mg daily.  12/16/2017 -this is 3 months follow-up, has been dealing with severe back pain secondary to spinal stenosis that is also associated with frequent falls and injury to her right arm.  She is scheduled for surgery with Dr. Lorin Mercy at [facility].  She denies any chest pain shortness of breath, she states her blood pressure has been lately, she has not had any lower extremity edema orthopnea proximal nocturnal dyspnea.  No syncope.  Past Medical History:  Diagnosis Date  . Abnormal CT of the chest 2008   last CT4-l 2009:  . No f/u suggested   . Allergy   . Asthma   . CAD (coronary artery disease)    a. Coronary CTA 10/16: Coronary Ca score 211, mod non-obstructive CAD with LM mild plaque (25-50%), mid LAD 50-69%. b. Neg nuc 06/2015.  . Cataract    BILATERAL-REMOVED  . Chronic diastolic CHF (congestive heart failure) ([place])   . Collagen vascular disease ([place])    "arterial sclerosis" per pt  . Complication of anesthesia    trouble waking up  . Fibromyalgia   . GERD (gastroesophageal reflux disease)   . H/O hiatal hernia   . Heart murmur   .  Hyperlipidemia   . Hypertension   . Malignant neoplasm of ascending colon ([place]) 2016   Minimally invasive right hemicolectomy to be done   . Neuromuscular disorder (H[facility])    FIBROMYALGIA  . Ocular migraine   . OSA (obstructive sleep apnea) 09/2007   dx w/ a sleep study, not on  CPAP  . Osteoarthritis   . Osteoporosis   . Pneumonia    "double" in 2004  . PONV (postoperative nausea and vomiting)   . Reactive airway disease 01/29/2002   dx of pseudoasthma / vcd in 2005 and nl sprirometry History of dyspnea, 2011,  improved after several medications were changed around Question of COPD, disproved July 06, 2009 with nl pft's      . Rheumatoid factor positive   . Shingles 11/2009  . Sleep apnea   . Stroke ([place])   . TIA (transient ischemic attack)    x2 - on Plavix for this  . Torn rotator cuff    right worse than left, both are torn  . Tumor, 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  . Type II diabetes mellitus ([place])   . 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 Vaginal dysplasia     Past Surgical History:  Procedure Laterality Date  . ABDOMINAL HYSTERECTOMY  1980   NO oophorectomy per pt   . ANTERIOR CERVICAL DECOMP/DISCECTOMY FUSION  2001   C 3, C4 and C5 plate and screws  . BREAST BIOPSY Right  1999  . BUNIONECTOMY Left ~ 1977  . CATARACT EXTRACTION W/ INTRAOCULAR LENS  IMPLANT, BILATERAL  2012  . COLON SURGERY  10/2014  . EYE SURGERY Bilateral    torq lens for cataracts  . LEFT HEART CATH AND CORONARY ANGIOGRAPHY N/A 03/22/2016   Procedure: Left Heart Cath and Coronary Angiography;  Surgeon: Belva Crome, MD;  Location: [facility];  Service: Cardiovascular;  Laterality: N/A;  . THYROIDECTOMY, PARTIAL  1960's  . VAGINAL MASS EXCISION  1994   "Laser surgery for vaginal cancer; followed by chemotherapy" (06/27/2012)    Current Medications: Outpatient Medications Prior to Visit  Medication Sig Dispense Refill  . acetaminophen (TYLENOL) 500 MG tablet Take 1,000 mg by mouth every  6 (six) hours as needed for moderate pain.    [name] albuterol (PROVENTIL HFA;VENTOLIN HFA) 108 (90 Base) MCG/ACT inhaler Inhale 2 puffs into the lungs every 6 (six) hours as needed for wheezing or shortness of breath. 18 g 3  . atorvastatin (LIPITOR) 20 MG tablet TAKE 1 TABLET BY MOUTH EVERY DAY 90 tablet 2  . budesonide-formoterol (SYMBICORT) 80-4.5 MCG/ACT inhaler Inhale 2 puffs into the lungs 2 (two) times daily. 1 Inhaler 3  . butalbital-acetaminophen-caffeine (FIORICET) 50-325-40 MG tablet Take 1 tablet by mouth every 6 (six) hours as needed for headache or migraine. 30 tablet 5  . CALCIUM PO Take 1 tablet by mouth every morning.     . cetirizine (ZYRTEC) 10 MG tablet Take 1 tablet (10 mg total) by mouth daily. 90 tablet 1  . cholecalciferol (VITAMIN D) 1000 UNITS tablet Take 1,000 Units by mouth every morning.     . clopidogrel (PLAVIX) 75 MG tablet TAKE 1 TABLET(75 MG) BY MOUTH DAILY 90 tablet 1  . hydrochlorothiazide (HYDRODIURIL) 25 MG tablet Take 0.5 tablets (12.5 mg total) by mouth daily. 90 tablet 1  . HYDROcodone-acetaminophen (NORCO/VICODIN) 5-325 MG tablet Take 1-2 tablets by mouth every 6 (six) hours as needed for moderate pain (for chronic back pain). 40 tablet 0  . hydrocortisone 2.5 % cream Apply topically 2 (two) times daily. (Patient taking differently: Apply 1 application topically 2 (two) times daily as needed (for rash). ) 30 g 0  . isosorbide mononitrate (IMDUR) 30 MG 24 hr tablet Take 1 tablet (30 mg total) by mouth daily. 90 tablet 1  . lisinopril (PRINIVIL,ZESTRIL) 40 MG tablet Take 1 tablet (40 mg total) by mouth daily. 90 tablet 1  . metFORMIN (GLUCOPHAGE-XR) 500 MG 24 hr tablet TAKE 1 TABLET(500 MG) BY MOUTH DAILY WITH BREAKFAST 90 tablet 0  . Multiple Vitamin (MULTIVITAMIN) capsule Take 1 capsule by mouth daily.      . nebivolol (BYSTOLIC) 10 MG tablet Take 1 tablet (10 mg total) by mouth daily. 90 tablet 1  . nitroGLYCERIN (NITROSTAT) 0.4 MG SL tablet Place 0.4 mg under  the tongue every 5 (five) minutes as needed for chest pain (x 3 doses).    . nystatin cream (MYCOSTATIN) Apply 1 application topically 2 (two) times daily. 30 g 5  . Polyethyl Glycol-Propyl Glycol (SYSTANE) 0.4-0.3 % GEL Place 1 drop into both eyes daily as needed (dry eyes).     . Potassium 99 MG TABS Take 99 mg by mouth daily.     . pregabalin (LYRICA) 100 MG capsule TAKE 1 CAPSULE BY MOUTH EVERY DAY AS NEEDED FOR PAIN 30 capsule 0  . promethazine (PHENERGAN) 25 MG tablet Take 1 tablet (25 mg total) by mouth every 8 (eight) hours as needed for  nausea or vomiting. 20 tablet 0  . ranitidine (ZANTAC) 300 MG tablet TAKE 1 TABLET(300 MG) BY MOUTH AT BEDTIME 90 tablet 0   No facility-administered medications prior to visit.      Allergies:   Bactrim [sulfamethoxazole-trimethoprim]; Cefuroxime axetil; Oxycodone; Pravastatin; Seldane [terfenadine]; Zocor [simvastatin]; Tramadol; Lime flavor [flavoring agent]; and Tape   Social History   Socioeconomic History  . Marital status: Married    Spouse name: Ilona Sorrel  . Number of children: 2  . Years of education: masters  . Highest education level: Not on file  Occupational History  . Occupation: Retired, disable since 2000    Employer: RETIRED  Social Needs  . Financial resource strain: Not hard at all  . Food insecurity:    Worry: Never true    Inability: Never true  . Transportation needs:    Medical: No    Non-medical: No  Tobacco Use  . Smoking status: Former Smoker    Packs/day: 0.25    Years: 5.00    Pack years: 1.25    Types: Cigarettes    Last attempt to quit: 01/29/1998    Years since quitting: 19.8  . Smokeless tobacco: Never Used  . Tobacco comment: Quit in 2001  Substance and Sexual Activity  . Alcohol use: No    Alcohol/week: 0.0 standard drinks  . Drug use: No  . Sexual activity: Never  Lifestyle  . Physical activity:    Days per week: 0 days    Minutes per session: 0 min  . Stress: Very much  Relationships  .  Social connections:    Talks on phone: More than three times a week    Gets together: More than three times a week    Attends religious service: More than 4 times per year    Active member of club or organization: Yes    Attends meetings of clubs or organizations: More than 4 times per year    Relationship status: Married  Other Topics Concern  . Not on file  Social History Narrative   On disability since 2000--- also husband has MS   Education. College.   Right handed.     Family History:  The patient's family history includes Allergies in her sister; Aneurysm in her brother; Asthma in her paternal grandmother and sister; Diabetes in her brother and brother; Emphysema in her brother; Heart disease in her brother, father, mother, and unknown relative; Kidney failure in her brother; Lung cancer in her mother; Parkinsonism in her sister; Stroke in her brother, maternal grandmother, and paternal grandmother.     ROS:   Please see the history of present illness.    ROS All other systems reviewed and are negative.   PHYSICAL EXAM:   VS:  BP (!) 164/68   Pulse 62   Ht 5\' 2"  (1.575 m)   Wt 193 lb (87.5 kg)   BMI 35.30 kg/m    GEN: Well nourished, well developed, in no acute distress, obese HEENT: normal  Neck: no JVD, carotid bruits, or masses Cardiac: RRR; no murmurs, rubs, or gallops, mild bilateral edema around the ankles. Respiratory:  clear to auscultation bilaterally, normal work of breathing GI: soft, nontender, nondistended, + BS MS: no deformity or atrophy  Skin: warm and dry, no rash Neuro:  Alert and Oriented x 3, Strength and sensation are intact Psych: euthymic mood, full affect   Wt Readings from Last 3 Encounters:  12/16/17 193 lb (87.5 kg)  12/10/17 191 lb (86.6 kg)  11/26/17 193 lb 12.8 oz (87.9 kg)    Studies/Labs Reviewed:   EKG:  EKG is ordered today.  It shows normal sinus rhythm with no nonspecific ST-T wave abnormalities in lateral leads but this was  personally reviewed.  Recent Labs: 04/23/2017: TSH 2.52 10/23/2017: ALT 12; BUN 21; Creatinine, Ser 0.92; Hemoglobin 12.8; Platelets 142.0; Potassium 3.9; Sodium 140   Lipid Panel    Component Value Date/Time   CHOL 140 10/23/2017 1050   TRIG 78.0 10/23/2017 1050   HDL 61.70 10/23/2017 1050   CHOLHDL 2 10/23/2017 1050   VLDL 15.6 10/23/2017 1050   LDLCALC 63 10/23/2017 1050   LDLDIRECT 126.2 12/07/2010 1039   Additional studies/ records that were reviewed today include:  Summarized above    ASSESSMENT & PLAN:   1.  Preoperative evaluation for spinal stenosis surgery, the patient had cardiac catheterization in 2018 that showed only mild nonobstructive CAD, she is recently asymptomatic, has no signs of heart failure can certainly achieve at least 4 METS.  Echo did not show any critical valvular abnormality.  She can hold Plavix 1 week prior to the surgery.  2.  Hypertension, I will increase Imdur to 60 mg daily.  3. CAD/chest pain: Left heart cath in 03/2016 showed 40% mid RCA and 50% mid LAD stenosis.  Continue plavix (on for TIA), statin and BB. She might have component of spasm as her symptoms have significantly improved with Imdur. Will continue.  She is currently asymptomatic.  4. Chronic diastolic CHF: Currently euvolemic, normal kidney function  5. HLD: continue statin. Followed by Dr. Quay Burow, last year old lipids at goal.  6. Colon CA: currently in remission.   7. Hx of TIA: continue plavix and statin, only if absolutely necessary if her epistaxis continues we will discontinue Plavix.  8.  Polycystic kidney disease -with normal kidney function, we will refer to nephrology.  Medication Adjustments/Labs and Tests Ordered: Current medicines are reviewed at length with the patient today.  Concerns regarding medicines are outlined above.  Medication changes, Labs and Tests ordered today are listed in the Patient Instructions below. There are no Patient Instructions on file for  this visit.   Signed, Ena Dawley, MD  12/16/2017 10:24 AM    [address], [place], [place]  [address] Phone: [phone]; Fax: [phone]

## 2017-12-16 NOTE — Telephone Encounter (Signed)
noted 

## 2017-12-16 NOTE — Telephone Encounter (Signed)
This message is created in letters under this pts chart.  The pt is requesting not to send this clearance note to her Orthopedic Surgeon Dr Lorin Mercy, until she see's her Kidney Specialist and is cleared by them, and then goes back to see Dr Lorin Mercy for scheduling of needed Orthopedic Sx.  Pt states she will call us back when it is time to fax this message and letter to her Orthopedic Surgeon Dr Lorin Mercy.  Pt also has a copy for herself, in official letterhead.

## 2017-12-16 NOTE — Progress Notes (Signed)
Clearance letter. thanks

## 2017-12-16 NOTE — Telephone Encounter (Signed)
-----   Message from [name], MD sent at 12/16/2017 10:44 AM EST ----- Regarding: Preoperative evaluation To whom it may concern,  I currently take care of patient Samantha [name], Samantha [name].  She was evaluated in my clinic on December 16, 2017 and is asymptomatic from chest pain or any signs of heart failure.  She underwent a cardiac catheterization in 2018 that showed only mild nonobstructive CAD. She can certainly achieve at least 4 METS.  Echo cardiogram did not show any critical valvular abnormality.  She can hold Plavix 1 week prior to the surgery.  There is currently no contraindication from cardiac standpoint for her to undergo spinal stenosis surgery.  Please call us with any questions,  Ena Dawley, MD 12/16/2017

## 2017-12-16 NOTE — Telephone Encounter (Signed)
Copied from [place] [phone]. Topic: Referral - Status >> Dec 16, 2017  2:58 PM Scherrie Gerlach wrote: Reason for CRM: pt states Dr Meda Coffee has approved her for her surgery, but in the meantime, has referred her to a nephrologist to get their approval as well. Wanted Dr Quay Burow to be aware of this.

## 2017-12-16 NOTE — Patient Instructions (Signed)
Medication Instructions:    INCREASE YOUR ISOSORBIDE MONONITRATE (IMDUR) TO 60 MG ONCE DAILY  If you need a refill on your cardiac medications before your next appointment, please call your pharmacy.      You have been referred to [facility] SEE DR. COLADONATO OR FIRST AVAILABLE PROVIDER AT THAT LOCATION ASAP--FOR POLYCYSTIC KIDNEY DISEASE      Follow-Up: At [facility], you and your health needs are our priority.  As part of our continuing mission to provide you with exceptional heart care, we have created designated Provider Care Teams.  These Care Teams include your primary Cardiologist (physician) and Advanced Practice Providers (APPs -  Physician Assistants and Nurse Practitioners) who all work together to provide you with the care you need, when you need it. You will need a follow up appointment in 4 months with [name]  Please call our office 2 months in advance to schedule this appointment.  You may see [name] or one of the following Advanced Practice Providers on your designated Care Team:   [place], PA-C Melina Copa, PA-C . [name], PA-C

## 2017-12-23 ENCOUNTER — Telehealth: Payer: Self-pay | Admitting: Cardiology

## 2017-12-23 DIAGNOSIS — Q613 Polycystic kidney, unspecified: Secondary | ICD-10-CM

## 2017-12-23 DIAGNOSIS — Z01818 Encounter for other preprocedural examination: Secondary | ICD-10-CM

## 2017-12-23 NOTE — Telephone Encounter (Signed)
New Message   Patient is calling because she has not heard anything in reference to her referral to nephrology. Please call to discuss.

## 2017-12-24 NOTE — Telephone Encounter (Signed)
RE: URGENT REFERRAL TO UROLOGY  Received: Today  Message Contents  Jerlyn Ly, LPN        [facility] to [facility]

## 2017-12-24 NOTE — Telephone Encounter (Signed)
Referral to Nephrology was denied on this pt by Dr. Moshe Cipro, stating that he reviewed the pts chart, and felt that she needed to be referred to Urology vs Nephrology.  Will place the referral to Urology in the system and send a message to our [facility] schedulers to help arrange this.  Will update the pt on this change and also make Dr Meda Coffee aware of this.  This referral needs to be scheduled ASAP, for the pt needs to be seen prior to having her unscheduled Orthopedic Surgery done on her lower back, by Dr Lorin Mercy.  Per Dr Meda Coffee and Dr Lorin Mercy, pt really needs to have a specialist review her kidney issues, prior to having back surgery.  Pts surgery is pending clearance, based on this referral.  Will place the referral HIGH PRIORITY, and advise our schedulers to get her in asap.  Pt made aware of plan and agrees with this plan.

## 2018-01-03 DIAGNOSIS — R3129 Other microscopic hematuria: Secondary | ICD-10-CM | POA: Diagnosis not present

## 2018-01-03 DIAGNOSIS — N3941 Urge incontinence: Secondary | ICD-10-CM | POA: Diagnosis not present

## 2018-01-03 DIAGNOSIS — N281 Cyst of kidney, acquired: Secondary | ICD-10-CM | POA: Diagnosis not present

## 2018-01-03 NOTE — Telephone Encounter (Signed)
Patient calling and states that she is having surgery on her spine and wanted to let Dr Quay Burow know that she has surgical clearance from Dr Meda Coffee and Dr Lovena Neighbours. If she has any questions, states that it is no problem to give her a call.  CB#: [phone]

## 2018-01-08 NOTE — Telephone Encounter (Signed)
Pt was seen by [facility] on 01/03/18.  Progress note from that visit was faxed to Dr Meda Coffee for review, and placed in her folder.

## 2018-01-13 ENCOUNTER — Other Ambulatory Visit: Payer: Self-pay | Admitting: Internal Medicine

## 2018-01-14 NOTE — Pre-Procedure Instructions (Signed)
Samantha [name]  01/14/2018      [facility] [place], [address] [place] [address] Phone: [phone] Fax: [phone]    Your procedure is scheduled on January 20, 2018.  Report to [facility] Admitting at 1030 AM.  Call this number if you have problems the morning of surgery:  [phone]   Remember:  Do not eat or drink after midnight.    Take these medicines the morning of surgery with A SIP OF WATER  nebivolol (bystolic) Tylenol-if needed pregabalin (lyrica)-if needed Albuterol inhaler-if needed Symbicort inhaler-if needed Cetirizine (Zyrtec) Hydrocodone-acetaminophen (norco)-if needed for pain Nitrostat-if needed for chest pain Promethazine (phenergan)-if needed for nausea  Follow your surgeon's instructions on when to hold/resume Plavix.  If no instructions were given call the office to determine how they would like to you take Plavix  7 days prior to surgery STOP taking any Aspirin (unless otherwise instructed by your surgeon), Aleve, Naproxen, Ibuprofen, Motrin, Advil, Goody's, BC's, all herbal medications, fish oil, and all vitamins    WHAT DO I DO ABOUT MY DIABETES MEDICATION?  [name] Do not take oral diabetes medicines (pills) the morning of surgery-metformin (glucophage-XR).  [name] The day of surgery, do not take other diabetes injectables, including Byetta (exenatide), Bydureon (exenatide ER), Victoza (liraglutide), or Trulicity (dulaglutide).  . If your CBG is greater than 220 mg/dL, you may take  of your sliding scale (correction) dose of insulin.  Reviewed and Endorsed by [facility] Patient Education Committee, August 2015   How to Manage Your Diabetes Before and After Surgery  Why is it important to control my blood sugar before and after surgery? . Improving blood sugar levels before and after surgery helps healing and can limit  problems. . A way of improving blood sugar control is eating a healthy diet by: o  Eating less sugar and carbohydrates o  Increasing activity/exercise o  Talking with your doctor about reaching your blood sugar goals . High blood sugars (greater than 180 mg/dL) can raise your risk of infections and slow your recovery, so you will need to focus on controlling your diabetes during the weeks before surgery. . Make sure that the doctor who takes care of your diabetes knows about your planned surgery including the date and location.  How do I manage my blood sugar before surgery? . Check your blood sugar at least 4 times a day, starting 2 days before surgery, to make sure that the level is not too high or low. o Check your blood sugar the morning of your surgery when you wake up and every 2 hours until you get to the Short Stay unit. . If your blood sugar is less than 70 mg/dL, you will need to treat for low blood sugar: o Do not take insulin. o Treat a low blood sugar (less than 70 mg/dL) with  cup of clear juice (cranberry or apple), 4 glucose tablets, OR glucose gel. Recheck blood sugar in 15 minutes after treatment (to make sure it is greater than 70 mg/dL). If your blood sugar is not greater than 70 mg/dL on recheck, call [phone] o  for further instructions. . Report your blood sugar to the short stay nurse when you get to Short Stay.  . If you are admitted to the hospital after surgery: o Your blood sugar will be checked by the staff and you will  probably be given insulin after surgery (instead of oral diabetes medicines) to make sure you have good blood sugar levels. o The goal for blood sugar control after surgery is 80-180 mg/dL.    Do not wear jewelry, make-up or nail polish.  Do not wear lotions, powders, or perfumes, or deodorant.  Do not shave 48 hours prior to surgery.    Do not bring valuables to the hospital.  [facility] is not responsible for any belongings or  valuables.  Contacts, dentures or bridgework may not be worn into surgery.  Leave your suitcase in the car.  After surgery it may be brought to your room.  For patients admitted to the hospital, discharge time will be determined by your treatment team.  Patients discharged the day of surgery will not be allowed to drive home.    [facility]- Preparing For Surgery  Before surgery, you can play an important role. Because skin is not sterile, your skin needs to be as free of germs as possible. You can reduce the number of germs on your skin by washing with CHG (chlorahexidine gluconate) Soap before surgery.  CHG is an antiseptic cleaner which kills germs and bonds with the skin to continue killing germs even after washing.    Oral Hygiene is also important to reduce your risk of infection.  Remember - BRUSH YOUR TEETH THE MORNING OF SURGERY WITH YOUR REGULAR TOOTHPASTE  Please do not use if you have an allergy to CHG or antibacterial soaps. If your skin becomes reddened/irritated stop using the CHG.  Do not shave (including legs and underarms) for at least 48 hours prior to first CHG shower. It is OK to shave your face.  Please follow these instructions carefully.   1. Shower the NIGHT BEFORE SURGERY and the MORNING OF SURGERY with CHG.   2. If you chose to wash your hair, wash your hair first as usual with your normal shampoo.  3. After you shampoo, rinse your hair and body thoroughly to remove the shampoo.  4. Use CHG as you would any other liquid soap. You can apply CHG directly to the skin and wash gently with a scrungie or a clean washcloth.   5. Apply the CHG Soap to your body ONLY FROM THE NECK DOWN.  Do not use on open wounds or open sores. Avoid contact with your eyes, ears, mouth and genitals (private parts). Wash Face and genitals (private parts)  with your normal soap.  6. Wash thoroughly, paying special attention to the area where your surgery will be  performed.  7. Thoroughly rinse your body with warm water from the neck down.  8. DO NOT shower/wash with your normal soap after using and rinsing off the CHG Soap.  9. Pat yourself dry with a CLEAN TOWEL.  10. Wear CLEAN PAJAMAS to bed the night before surgery, wear comfortable clothes the morning of surgery  11. Place CLEAN SHEETS on your bed the night of your first shower and DO NOT SLEEP WITH PETS.  Day of Surgery:  Do not apply any deodorants/lotions.  Please wear clean clothes to the hospital/surgery center.   Remember to brush your teeth WITH YOUR REGULAR TOOTHPASTE.  Please read over the following fact sheets that you were given.

## 2018-01-15 ENCOUNTER — Encounter (INDEPENDENT_AMBULATORY_CARE_PROVIDER_SITE_OTHER): Payer: Self-pay | Admitting: Surgery

## 2018-01-15 ENCOUNTER — Encounter: Payer: Self-pay | Admitting: Internal Medicine

## 2018-01-15 ENCOUNTER — Ambulatory Visit (INDEPENDENT_AMBULATORY_CARE_PROVIDER_SITE_OTHER): Payer: Medicare Other | Admitting: Internal Medicine

## 2018-01-15 ENCOUNTER — Other Ambulatory Visit: Payer: Self-pay | Admitting: Internal Medicine

## 2018-01-15 ENCOUNTER — Ambulatory Visit: Payer: Self-pay

## 2018-01-15 ENCOUNTER — Ambulatory Visit (INDEPENDENT_AMBULATORY_CARE_PROVIDER_SITE_OTHER): Payer: Medicare Other | Admitting: Surgery

## 2018-01-15 VITALS — BP 132/66 | HR 58 | Temp 97.3°F | Ht 62.0 in | Wt 193.0 lb

## 2018-01-15 VITALS — BP 132/68 | HR 75 | Temp 98.2°F | Resp 16 | Ht 62.0 in | Wt 193.0 lb

## 2018-01-15 DIAGNOSIS — M4807 Spinal stenosis, lumbosacral region: Secondary | ICD-10-CM

## 2018-01-15 DIAGNOSIS — I2510 Atherosclerotic heart disease of native coronary artery without angina pectoris: Secondary | ICD-10-CM

## 2018-01-15 DIAGNOSIS — A084 Viral intestinal infection, unspecified: Secondary | ICD-10-CM | POA: Diagnosis not present

## 2018-01-15 DIAGNOSIS — M47816 Spondylosis without myelopathy or radiculopathy, lumbar region: Secondary | ICD-10-CM

## 2018-01-15 MED ORDER — HYDROCODONE-ACETAMINOPHEN 5-325 MG PO TABS: 1.0000 | ORAL_TABLET | Freq: Four times a day (QID) | ORAL | 0 refills | Status: AC | PRN

## 2018-01-15 NOTE — Assessment & Plan Note (Signed)
Symptoms have improved significantly at home most completely resolved She likely had a case of viral gastroenteritis, less likely flu since she did not have any fever Continue symptomatic treatment Increase rest and fluids Bland diet She will call if her symptoms do not continue to improve

## 2018-01-15 NOTE — Progress Notes (Addendum)
77 year old white female history of L4-5 stenosis I have received preop clearance from urologist Dr. Lovena Neighbours along with cardiologist..  Symptoms unchanged from previous visit.  I reviewed patient's medical history and full history and physical performed today.    Surgical procedure discussed.  All questions answered.   Patient went for preop labs January 16, 2018 and she does have an abnormal UA with UTI.  We will have patient take Cipro 500 g p.o. twice daily x10 days.  Patient also would not finish prescription before her surgery.  I did have the lab add on a urine culture.  We may repeat the UA a.m. of surgery.

## 2018-01-15 NOTE — Telephone Encounter (Signed)
Called pt.  Reported onset of diarrhea on Monday.  Stated she has had approx. 4-6 loose brown/ watery stools over past 24 hrs.  C/o intermittent abdominal cramping; noted this has subsided now.  Had nausea on Monday, but this has subsided; no vomiting.  Denied fever.  Denied dizziness, weakness, or decreased urine output.  Stated mouth was dry yesterday.  Reported she thinks she drinks about 3 bottles of water/day.  Voiced concern about GI virus.  Is scheduled for back surgery on Monday, and concerned about being able to have the surgery.  Was advised to call PCP by the Orthopedic surgeon.  Appt. scheduled this afternoon with PCP @ 3:00 PM.  Care advice given per protocol; reviewed signs of dehydration with pt.; verb. Understanding.  Pt.  agrees with plan.         Reason for Disposition . [1] MODERATE diarrhea (e.g., 4-6 times / day more than normal) AND [2] age > 70 years  Answer Assessment - Initial Assessment Questions 1. DIARRHEA SEVERITY: "How bad is the diarrhea?" "How many extra stools have you had in the past 24 hours than normal?"    - NO DIARRHEA (SCALE 0)   - MILD (SCALE 1-3): Few loose or mushy BMs; increase of 1-3 stools over normal daily number of stools; mild increase in ostomy output.   -  MODERATE (SCALE 4-7): Increase of 4-6 stools daily over normal; moderate increase in ostomy output. * SEVERE (SCALE 8-10; OR 'WORST POSSIBLE'): Increase of 7 or more stools daily over normal; moderate increase in ostomy output; incontinence.     5 stools  2. ONSET: "When did the diarrhea begin?"      Monday  3. BM CONSISTENCY: "How loose or watery is the diarrhea?"      Loose brown and watery 4. VOMITING: "Are you also vomiting?" If so, ask: "How many times in the past 24 hours?"      No vomiting 5. ABDOMINAL PAIN: "Are you having any abdominal pain?" If yes: "What does it feel like?" (e.g., crampy, dull, intermittent, constant)     Abdominal cramping 6. ABDOMINAL PAIN SEVERITY: If present, ask:  "How bad is the pain?"  (e.g., Scale 1-10; mild, moderate, or severe)   - MILD (1-3): doesn't interfere with normal activities, abdomen soft and not tender to touch    - MODERATE (4-7): interferes with normal activities or awakens from sleep, tender to touch    - SEVERE (8-10): excruciating pain, doubled over, unable to do any normal activities       6-7/ 10; cramping has improved now since 3:00 AM  7. ORAL INTAKE: If vomiting, "Have you been able to drink liquids?" "How much fluids have you had in the past 24 hours?"    Drinking water ; about 3 bottles water / day  8. HYDRATION: "Any signs of dehydration?" (e.g., dry mouth [not just dry lips], too weak to stand, dizziness, new weight loss) "When did you last urinate?"     Denied current sx's of dehydration; stated dry mouth yesterday 9. EXPOSURE: "Have you traveled to a foreign country recently?" "Have you been exposed to anyone with diarrhea?" "Could you have eaten any food that was spoiled?"    Denied travel outside country; husband has had diarrhea.  10. ANTIBIOTIC USE: "Are you taking antibiotics now or have you taken antibiotics in the past 2 months?"       Denied  11. OTHER SYMPTOMS: "Do you have any other symptoms?" (e.g., fever, blood in  stool)       Did have nausea; subsided.  Denied fever 12. PREGNANCY: "Is there any chance you are pregnant?" "When was your last menstrual period?"       N/a  Protocols used: DIARRHEA-A-AH

## 2018-01-15 NOTE — Progress Notes (Signed)
Subjective:    Patient ID: Samantha [name], female    DOB: October 01, 1940, 77 y.o.   MRN: [id number]  HPI The patient is here for an acute visit.  Sunday evening, three days ago she started having diarrhea.  The diarrhea continued the next day and was worse yesterday.  She was having multiple episodes a day.  She had abdominal cramping throughout the day as well.  Since 3:00 this morning she has not had any diarrhea and her abdominal cramping is almost completely resolved.  She did have a little nausea at one point, but never vomited.  She denies any fevers or chills.  She did have some bleeding when she wiped after having a bowel movement, but denies any blood in the stool.  She has known hemorrhoids.  She did have some chills, achiness, headaches, lightheadedness and mild cough/wheeze.  She denies any fevers.  She has been trying to keep up with fluids, but may not have done as good a couple of days.  She is not eating much.  She was mostly concerned about her symptoms because she is scheduled to have surgery on her back next week.  Medications and allergies reviewed with patient and updated if appropriate.  Patient Active Problem List   Diagnosis Date Noted  . Spinal stenosis of lumbar region 12/11/2017  . Spinal stenosis of lumbosacral region 11/26/2017  . Acute left-sided low back pain without sciatica 11/10/2017  . GERD (gastroesophageal reflux disease) 10/23/2017  . Candidiasis of skin 10/16/2017  . Sacral back pain 10/16/2017  . Epistaxis 07/23/2017  . Headache 07/23/2017  . Chronic left upper quadrant pain 07/23/2017  . Rectal bleeding 07/23/2017  . Poor balance 03/14/2017  . Sprain of right ankle 03/14/2017  . Right shoulder pain 03/14/2017  . Hair loss 10/19/2016  . Difficulty urinating 04/18/2016  . Angina pectoris ([place]) 03/22/2016  . Spondylosis of lumbar region without myelopathy or radiculopathy 02/02/2016  . CAD in native artery 08/22/2015  . Hypertensive heart  disease 07/07/2015  . Essential hypertension 07/07/2015  . SOB (shortness of breath) 07/07/2015  . Dizziness 07/07/2015  . Plantar fasciitis, left 03/22/2015  . Coronary artery disease due to lipid rich plaque 01/05/2015  . Chronic diastolic CHF (congestive heart failure), NYHA class 2 ([place]) 01/05/2015  . 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 Migraine (Ocular) 01/05/2014  . Gait difficulty 10/06/2013  . Pain in joint, shoulder region 11/27/2012  . VBI (vertebrobasilar insufficiency) 08/22/2012  . Right knee pain 07/29/2012  . TIA (transient ischemic attack) 06/27/2012  . Edema 02/01/2012  . DJD (degenerative joint disease) 02/02/2011  . 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 Postherpetic neuralgia ? 01/02/2010  . NECK PAIN 10/05/2009  . Palpitations 11/23/2008  . UTI'S, RECURRENT 09/28/2008  . Fibromyalgia 08/15/2007  . 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 Hyperlipidemia 05/21/2006  . Reactive airway disease 01/29/2002    Current Outpatient Medications on File Prior to Visit  Medication Sig Dispense Refill  . acetaminophen (TYLENOL) 500 MG tablet Take 500-1,000 mg by mouth every 6 (six) hours as needed for moderate pain.     [name] albuterol (PROVENTIL HFA;VENTOLIN HFA) 108 (90 Base) MCG/ACT inhaler Inhale 2 puffs into the lungs every 6 (six) hours as needed for wheezing or shortness of breath. 18 g 3  . atorvastatin (LIPITOR) 20 MG tablet TAKE 1 TABLET BY MOUTH EVERY DAY (Patient taking differently: Take 20  mg by mouth daily. ) 90 tablet 2  . budesonide-formoterol (SYMBICORT) 80-4.5 MCG/ACT inhaler Inhale 2 puffs into the lungs 2 (two) times daily. (Patient taking differently: Inhale 2 puffs into the lungs 2 (two) times daily as needed (for shortness of breath or wheezing). ) 1 Inhaler 3  . butalbital-acetaminophen-caffeine (FIORICET) 50-325-40 MG tablet Take 1 tablet by mouth every 6 (six) hours as  needed for headache or migraine. 30 tablet 5  . CALCIUM PO Take 1 tablet by mouth every morning.     . cetirizine (ZYRTEC) 10 MG tablet TAKE 1 TABLET(10 MG) BY MOUTH DAILY 90 tablet 0  . cholecalciferol (VITAMIN D) 1000 UNITS tablet Take 1,000 Units by mouth every morning.     . clopidogrel (PLAVIX) 75 MG tablet TAKE 1 TABLET(75 MG) BY MOUTH DAILY (Patient taking differently: Take 75 mg by mouth daily. ) 90 tablet 1  . hydrochlorothiazide (HYDRODIURIL) 25 MG tablet Take 0.5 tablets (12.5 mg total) by mouth daily. 90 tablet 1  . HYDROcodone-acetaminophen (NORCO/VICODIN) 5-325 MG tablet Take 1-2 tablets by mouth every 6 (six) hours as needed for moderate pain (for chronic back pain). (Patient taking differently: Take 0.5 tablets by mouth every 6 (six) hours as needed for moderate pain (for chronic back pain). ) 40 tablet 0  . hydrocortisone 2.5 % cream Apply topically 2 (two) times daily. (Patient taking differently: Apply 1 application topically 2 (two) times daily as needed (for rash). ) 30 g 0  . isosorbide mononitrate (IMDUR) 60 MG 24 hr tablet Take 1 tablet (60 mg total) by mouth daily. 90 tablet 3  . lisinopril (PRINIVIL,ZESTRIL) 40 MG tablet Take 1 tablet (40 mg total) by mouth daily. 90 tablet 1  . metFORMIN (GLUCOPHAGE-XR) 500 MG 24 hr tablet TAKE 1 TABLET(500 MG) BY MOUTH DAILY WITH BREAKFAST (Patient taking differently: Take 500 mg by mouth daily with breakfast. ) 90 tablet 0  . Multiple Vitamin (MULTIVITAMIN) capsule Take 1 capsule by mouth daily.      . nebivolol (BYSTOLIC) 10 MG tablet Take 1 tablet (10 mg total) by mouth daily. 90 tablet 1  . nitroGLYCERIN (NITROSTAT) 0.4 MG SL tablet Place 0.4 mg under the tongue every 5 (five) minutes as needed for chest pain (x 3 doses).    Vladimir Faster Glycol-Propyl Glycol (SYSTANE) 0.4-0.3 % GEL Place 1 application into both eyes daily as needed (for dry eyes).     . Potassium 99 MG TABS Take 99 mg by mouth daily.     . pregabalin (LYRICA) 100 MG  capsule TAKE 1 CAPSULE BY MOUTH EVERY DAY AS NEEDED FOR PAIN (Patient taking differently: Take 100 mg by mouth daily as needed (for pain). ) 30 capsule 0  . promethazine (PHENERGAN) 25 MG tablet Take 1 tablet (25 mg total) by mouth every 8 (eight) hours as needed for nausea or vomiting. 20 tablet 0  . ranitidine (ZANTAC) 300 MG tablet TAKE 1 TABLET(300 MG) BY MOUTH AT BEDTIME (Patient taking differently: Take 300 mg by mouth at bedtime. ) 90 tablet 1   No current facility-administered medications on file prior to visit.     Past Medical History:  Diagnosis Date  . Abnormal CT of the chest 2008   last CT4-l 2009:  . No f/u suggested   . Allergy   . Asthma   . CAD (coronary artery disease)    a. Coronary CTA 10/16: Coronary Ca score 211, mod non-obstructive CAD with LM mild plaque (25-50%), mid LAD 50-69%. b. Neg nuc  06/2015.  . Cataract    BILATERAL-REMOVED  . Chronic diastolic CHF (congestive heart failure) ([place])   . Collagen vascular disease ([place])    "arterial sclerosis" per pt  . Complication of anesthesia    trouble waking up  . Fibromyalgia   . GERD (gastroesophageal reflux disease)   . H/O hiatal hernia   . Heart murmur   . Hyperlipidemia   . Hypertension   . Malignant neoplasm of ascending colon ([place]) 2016   Minimally invasive right hemicolectomy to be done   . Neuromuscular disorder ([facility])    FIBROMYALGIA  . Ocular migraine   . OSA (obstructive sleep apnea) 09/2007   dx w/ a sleep study, not on  CPAP  . Osteoarthritis   . Osteoporosis   . Pneumonia    "double" in 2004  . PONV (postoperative nausea and vomiting)   . Reactive airway disease 01/29/2002   dx of pseudoasthma / vcd in 2005 and nl sprirometry History of dyspnea, 2011,  improved after several medications were changed around Question of COPD, disproved July 06, 2009 with nl pft's      . Rheumatoid factor positive   . Shingles 11/2009  . Sleep apnea   . Stroke ([place])   . TIA (transient ischemic attack)    x2 -  on Plavix for this  . Torn rotator cuff    right worse than left, both are torn  . Tumor, 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  . Type II diabetes mellitus ([place])   . 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 Vaginal dysplasia     Past Surgical History:  Procedure Laterality Date  . ABDOMINAL HYSTERECTOMY  1980   NO oophorectomy per pt   . ANTERIOR CERVICAL DECOMP/DISCECTOMY FUSION  2001   C 3, C4 and C5 plate and screws  . BREAST BIOPSY Right 1999  . BUNIONECTOMY Left ~ 1977  . CATARACT EXTRACTION W/ INTRAOCULAR LENS  IMPLANT, BILATERAL  2012  . COLON SURGERY  10/2014  . EYE SURGERY Bilateral    torq lens for cataracts  . LEFT HEART CATH AND CORONARY ANGIOGRAPHY N/A 03/22/2016   Procedure: Left Heart Cath and Coronary Angiography;  Surgeon: Belva Crome, MD;  Location: [facility];  Service: Cardiovascular;  Laterality: N/A;  . THYROIDECTOMY, PARTIAL  1960's  . VAGINAL MASS EXCISION  1994   "Laser surgery for vaginal cancer; followed by chemotherapy" (06/27/2012)    Social History   Socioeconomic History  . Marital status: Married    Spouse name: Ilona Sorrel  . Number of children: 2  . Years of education: masters  . Highest education level: Not on file  Occupational History  . Occupation: Retired, disable since 2000    Employer: RETIRED  Social Needs  . Financial resource strain: Not hard at all  . Food insecurity:    Worry: Never true    Inability: Never true  . Transportation needs:    Medical: No    Non-medical: No  Tobacco Use  . Smoking status: Former Smoker    Packs/day: 0.25    Years: 5.00    Pack years: 1.25    Types: Cigarettes    Last attempt to quit: 01/29/1998    Years since quitting: 19.9  . Smokeless tobacco: Never Used  . Tobacco comment: Quit in 2001  Substance and Sexual Activity  . Alcohol use: No    Alcohol/week: 0.0 standard drinks  . Drug use: No  . Sexual activity:  Never  Lifestyle  . Physical activity:    Days per week: 0 days     Minutes per session: 0 min  . Stress: Very much  Relationships  . Social connections:    Talks on phone: More than three times a week    Gets together: More than three times a week    Attends religious service: More than 4 times per year    Active member of club or organization: Yes    Attends meetings of clubs or organizations: More than 4 times per year    Relationship status: Married  Other Topics Concern  . Not on file  Social History Narrative   On disability since 2000--- also husband has MS   Education. College.   Right handed.    Family History  Problem Relation Age of Onset  . Heart disease Father   . Heart disease Mother   . Lung cancer Mother   . Allergies Sister   . Parkinsonism Sister        possible  . Asthma Sister   . Asthma Paternal Grandmother   . Stroke Paternal Grandmother   . Heart disease Unknown        paternal grandparents, maternal grandparents,   . Heart disease Brother   . Emphysema Brother   . Aneurysm Brother        x3  . Kidney failure Brother   . Diabetes Brother   . Diabetes Brother   . Stroke Brother   . Stroke Maternal Grandmother   . Breast cancer Neg Hx   . Colon cancer Neg Hx   . Heart attack Neg Hx     Review of Systems  Constitutional: Positive for chills. Negative for fever.  HENT: Positive for sneezing (x 1).   Respiratory: Positive for cough (for two days) and wheezing (chronic).   Gastrointestinal: Positive for abdominal pain (cramping x 3 days - none since 3 am), diarrhea and nausea (mild). Negative for blood in stool and vomiting.  Musculoskeletal: Positive for myalgias.  Neurological: Positive for light-headedness (1-2 times) and headaches.       Objective:   Vitals:   01/15/18 1511  BP: 132/68  Pulse: 75  Resp: 16  Temp: 98.2 F (36.8 C)  SpO2: 99%   BP Readings from Last 3 Encounters:  01/15/18 132/68  01/15/18 132/66  12/16/17 (!) 164/68   Wt Readings from Last 3 Encounters:  01/15/18 193 lb (87.5  kg)  01/15/18 193 lb (87.5 kg)  12/16/17 193 lb (87.5 kg)   Body mass index is 35.3 kg/m.   Physical Exam Constitutional:      General: She is not in acute distress.    Appearance: Normal appearance. She is not ill-appearing, toxic-appearing or diaphoretic.  HENT:     Head: Normocephalic and atraumatic.  Cardiovascular:     Rate and Rhythm: Normal rate and regular rhythm.  Pulmonary:     Effort: Pulmonary effort is normal. No respiratory distress.     Breath sounds: Normal breath sounds. No wheezing or rhonchi.  Abdominal:     General: There is no distension.     Palpations: Abdomen is soft.     Tenderness: There is abdominal tenderness (minimal - LLQ and suprapubic regions). There is no guarding or rebound.  Musculoskeletal:        General: No swelling.  Skin:    General: Skin is warm and dry.  Neurological:     Mental Status: She is alert.  Assessment & Plan:    See Problem List for Assessment and Plan of chronic medical problems.

## 2018-01-15 NOTE — Telephone Encounter (Signed)
Message from Denver Faster sent at 01/15/2018 11:59 AM EST   Summary: advice    Pt called in and stated that she is having Decompression surgery on her lumbar 4 and 5 on Monday. She see the ortho dr today and he told her to call her pcp and find out if there was any advice they could give her because he has had a little stomach bug. No fever, just Diarrhea for a few days. She has been trying to hydrate. She stated the crapping is going away. She wants to make sure she is well for her surgery Monday   Best number - [phone]

## 2018-01-15 NOTE — Assessment & Plan Note (Signed)
Having significant pain Scheduled to have surgery next week Has some Hycodan left, but is rationing her pills Will provide 1 last prescription prior to surgery After surgery orthopedics will prescribe her pain medication  [place] controlled substance database checked.  Ok to fill medication.

## 2018-01-15 NOTE — Patient Instructions (Signed)
Increase your fluids and eat a bland diet.   If your symptoms do not continue to improve, please call.   Viral Gastroenteritis, Adult  Viral gastroenteritis is also known as the stomach flu. This condition is caused by various viruses. These viruses can be passed from person to person very easily (are very contagious). This condition may affect your stomach, small intestine, and large intestine. It can cause sudden watery diarrhea, fever, and vomiting. Diarrhea and vomiting can make you feel weak and cause you to become dehydrated. You may not be able to keep fluids down. Dehydration can make you tired and thirsty, cause you to have a dry mouth, and decrease how often you urinate. Older adults and people with other diseases or a weak immune system are at higher risk for dehydration. It is important to replace the fluids that you lose from diarrhea and vomiting. If you become severely dehydrated, you may need to get fluids through an IV tube. What are the causes? Gastroenteritis is caused by various viruses, including rotavirus and norovirus. Norovirus is the most common cause in adults. You can get sick by eating food, drinking water, or touching a surface contaminated with one of these viruses. You can also get sick from sharing utensils or other personal items with an infected person. What increases the risk? This condition is more likely to develop in people:  Who have a weak defense system (immune system).  Who live with one or more children who are younger than 27 years old.  Who live in a nursing home.  Who go on cruise ships. What are the signs or symptoms? Symptoms of this condition start suddenly 1-2 days after exposure to a virus. Symptoms may last a few days or as long as a week. The most common symptoms are watery diarrhea and vomiting. Other symptoms include:  Fever.  Headache.  Fatigue.  Pain in the abdomen.  Chills.  Weakness.  Nausea.  Muscle aches.  Loss of  appetite. How is this diagnosed? This condition is diagnosed with a medical history and physical exam. You may also have a stool test to check for viruses or other infections. How is this treated? This condition typically goes away on its own. The focus of treatment is to restore lost fluids (rehydration). Your health care provider may recommend that you take an oral rehydration solution (ORS) to replace important salts and minerals (electrolytes) in your body. Severe cases of this condition may require giving fluids through an IV tube. Treatment may also include medicine to help with your symptoms. Follow these instructions at home:  Follow instructions from your health care provider about how to care for yourself at home. Follow these recommendations as told by your health care provider:  Take an ORS. This is a drink that is sold at pharmacies and retail stores.  Drink clear fluids in small amounts as you are able. Clear fluids include water, ice chips, diluted fruit juice, and low-calorie sports drinks.  Eat bland, easy-to-digest foods in small amounts as you are able. These foods include bananas, applesauce, rice, lean meats, toast, and crackers.  Avoid fluids that contain a lot of sugar or caffeine, such as energy drinks, sports drinks, and soda.  Avoid alcohol.  Avoid spicy or fatty foods. General instructions  Drink enough fluid to keep your urine clear or pale yellow.  Wash your hands often. If soap and water are not available, use hand sanitizer.  Make sure that all people in your household wash  their hands well and often.  Take over-the-counter and prescription medicines only as told by your health care provider.  Rest at home while you recover.  Watch your condition for any changes.  Take a warm bath to relieve any burning or pain from frequent diarrhea episodes.  Keep all follow-up visits as told by your health care provider. This is important. Contact a health care  provider if:  You cannot keep fluids down.  Your symptoms get worse.  You have new symptoms.  You feel light-headed or dizzy.  You have muscle cramps. Get help right away if:  You have chest pain.  You feel extremely weak or you faint.  You see blood in your vomit.  Your vomit looks like coffee grounds.  You have bloody or black stools or stools that look like tar.  You have a severe headache, a stiff neck, or both.  You have a rash.  You have severe pain, cramping, or bloating in your abdomen.  You have trouble breathing or you are breathing very quickly.  Your heart is beating very quickly.  Your skin feels cold and clammy.  You feel confused.  You have pain when you urinate.  You have signs of dehydration, such as: ? Dark urine, very little urine, or no urine. ? Cracked lips. ? Dry mouth. ? Sunken eyes. ? Sleepiness. ? Weakness. This information is not intended to replace advice given to you by your health care provider. Make sure you discuss any questions you have with your health care provider. Document Released: 01/15/2005 Document Revised: 08/30/2016 Document Reviewed: 09/21/2014 Elsevier Interactive Patient Education  2019 [employer].

## 2018-01-16 ENCOUNTER — Encounter (HOSPITAL_COMMUNITY): Payer: Self-pay

## 2018-01-16 ENCOUNTER — Encounter (HOSPITAL_COMMUNITY)
Admission: RE | Admit: 2018-01-16 | Discharge: 2018-01-16 | Disposition: A | Discharge status: Home / Self Care | Payer: Medicare Other | Source: Ambulatory Visit | Attending: Orthopaedic Surgery | Admitting: Orthopaedic Surgery

## 2018-01-16 ENCOUNTER — Other Ambulatory Visit: Payer: Self-pay

## 2018-01-16 ENCOUNTER — Ambulatory Visit (HOSPITAL_COMMUNITY)
Admission: RE | Admit: 2018-01-16 | Discharge: 2018-01-16 | Disposition: A | Discharge status: Home / Self Care | Payer: Medicare Other | Source: Ambulatory Visit | Attending: Surgery | Admitting: Surgery

## 2018-01-16 DIAGNOSIS — Z01818 Encounter for other preprocedural examination: Secondary | ICD-10-CM | POA: Insufficient documentation

## 2018-01-16 LAB — GLUCOSE, CAPILLARY: Glucose-Capillary: 106 mg/dL — ABNORMAL HIGH (ref 70–99)

## 2018-01-16 LAB — URINALYSIS, ROUTINE W REFLEX MICROSCOPIC
Bilirubin Urine: NEGATIVE
Glucose, UA: NEGATIVE mg/dL
KETONES UR: NEGATIVE mg/dL
Nitrite: POSITIVE — AB
PH: 5 (ref 5.0–8.0)
Protein, ur: NEGATIVE mg/dL
Specific Gravity, Urine: 1.018 (ref 1.005–1.030)

## 2018-01-16 LAB — CBC
HCT: 38.3 % (ref 36.0–46.0)
Hemoglobin: 12.2 g/dL (ref 12.0–15.0)
MCH: 27.8 pg (ref 26.0–34.0)
MCHC: 31.9 g/dL (ref 30.0–36.0)
MCV: 87.2 fL (ref 80.0–100.0)
Platelets: 152 10*3/uL (ref 150–400)
RBC: 4.39 MIL/uL (ref 3.87–5.11)
RDW: 14 % (ref 11.5–15.5)
WBC: 3.8 10*3/uL — ABNORMAL LOW (ref 4.0–10.5)
nRBC: 0 % (ref 0.0–0.2)

## 2018-01-16 LAB — COMPREHENSIVE METABOLIC PANEL
ALT: 16 U/L (ref 0–44)
AST: 18 U/L (ref 15–41)
Albumin: 3.7 g/dL (ref 3.5–5.0)
Alkaline Phosphatase: 42 U/L (ref 38–126)
Anion gap: 9 (ref 5–15)
BILIRUBIN TOTAL: 0.7 mg/dL (ref 0.3–1.2)
BUN: 14 mg/dL (ref 8–23)
CO2: 25 mmol/L (ref 22–32)
Calcium: 9 mg/dL (ref 8.9–10.3)
Chloride: 108 mmol/L (ref 98–111)
Creatinine, Ser: 0.86 mg/dL (ref 0.44–1.00)
GFR calc Af Amer: >60 mL/min (ref >60)
Glucose, Bld: 121 mg/dL — ABNORMAL HIGH (ref 70–99)
POTASSIUM: 4 mmol/L (ref 3.5–5.1)
Sodium: 142 mmol/L (ref 135–145)
TOTAL PROTEIN: 6.9 g/dL (ref 6.5–8.1)

## 2018-01-16 LAB — HEMOGLOBIN A1C
Hgb A1c MFr Bld: 6.3 % — ABNORMAL HIGH (ref 4.8–5.6)
Mean Plasma Glucose: 134.11 mg/dL

## 2018-01-16 LAB — SURGICAL PCR SCREEN
MRSA, PCR: NEGATIVE
Staphylococcus aureus: NEGATIVE

## 2018-01-16 MED ORDER — CIPROFLOXACIN HCL 500 MG PO TABS: 500.0000 mg | ORAL_TABLET | Freq: Two times a day (BID) | ORAL | 0 refills | Status: DC

## 2018-01-16 NOTE — Addendum Note (Signed)
Addended by: Lanae Crumbly on: 01/16/2018 02:23 PM   Modules accepted: Orders

## 2018-01-16 NOTE — Pre-Procedure Instructions (Signed)
Samantha [name]  01/16/2018      [facility] [place], [address] [place] [address] Phone: [phone] Fax: [phone]    Your procedure is scheduled on Monday, January 20, 2018.   Report to [facility] Admitting at 1030 AM.             (posted surgery time 12:30p - 4:32p)   Call this number if you have problems the morning of surgery:  [phone]   Remember:   Do not eat any foods or drink any liquids after midnight, Sunday.    Take these medicines the morning of surgery with A SIP OF WATER  nebivolol (bystolic) Tylenol-if needed pregabalin (lyrica)-if needed Albuterol inhaler-if needed Symbicort inhaler-if needed Cetirizine (Zyrtec) Hydrocodone-acetaminophen (norco)-if needed for pain Nitrostat-if needed for chest pain Promethazine (phenergan)-if needed for nausea  Follow your surgeon's instructions on when to hold/resume Plavix.  If no instructions were given call the office to determine how they would like to you take Plavix  7 days prior to surgery STOP taking any Aspirin (unless otherwise instructed by your surgeon), Aleve, Naproxen, Ibuprofen, Motrin, Advil, Goody's, BC's, all herbal medications, fish oil, and all vitamins    WHAT DO I DO ABOUT MY DIABETES MEDICATION?  [name] Do not take oral diabetes medicines (pills) the morning of surgery-metformin (glucophage-XR).  [name] The day of surgery, do not take other diabetes injectables, including Byetta (exenatide), Bydureon (exenatide ER), Victoza (liraglutide), or Trulicity (dulaglutide).  . If your CBG is greater than 220 mg/dL, you may take  of your sliding scale (correction) dose of insulin.    How to Manage Your Diabetes Before and After Surgery  Why is it important to control my blood sugar before and after surgery? . Improving blood sugar levels before and after surgery helps healing and  can limit problems. . A way of improving blood sugar control is eating a healthy diet by: o  Eating less sugar and carbohydrates o  Increasing activity/exercise o  Talking with your doctor about reaching your blood sugar goals . High blood sugars (greater than 180 mg/dL) can raise your risk of infections and slow your recovery, so you will need to focus on controlling your diabetes during the weeks before surgery. . Make sure that the doctor who takes care of your diabetes knows about your planned surgery including the date and location.  How do I manage my blood sugar before surgery? . Check your blood sugar at least 4 times a day, starting 2 days before surgery, to make sure that the level is not too high or low. o Check your blood sugar the morning of your surgery when you wake up and every 2 hours until you get to the Short Stay unit. o  . If your blood sugar is less than 70 mg/dL, you will need to treat for low blood sugar: o Do not take insulin. o Treat a low blood sugar (less than 70 mg/dL) with  cup of clear juice (cranberry or apple), 4 glucose tablets, OR glucose gel. o DO NOT DRINK ORANGE JUICE.   Recheck blood sugar in 15 minutes after treatment (to make sure it is greater than 70 mg/dL). If your blood sugar is not greater than 70 mg/dL on recheck, call [phone] o  for further instructions. . Report your blood sugar to the short stay nurse when you get to  Short Stay.  . If you are admitted to the hospital after surgery: o Your blood sugar will be checked by the staff and you will probably be given insulin after surgery (instead of oral diabetes medicines) to make sure you have good blood sugar levels. o The goal for blood sugar control after surgery is 80-180 mg/dL.    Do not wear jewelry, make-up or nail polish.  Do not wear lotions, powders,  perfumes, or deodorant.  Do not shave 48 hours prior to surgery.     Do not bring valuables to the hospital.  [facility] is  not responsible for any belongings or valuables.  Contacts, dentures or bridgework may not be worn into surgery.  Leave your suitcase in the car.  After surgery it may be brought to your room.  For patients admitted to the hospital, discharge time will be determined by your treatment team.      [facility]- Preparing For Surgery  Before surgery, you can play an important role. Because skin is not sterile, your skin needs to be as free of germs as possible. You can reduce the number of germs on your skin by washing with CHG (chlorahexidine gluconate) Soap before surgery.  CHG is an antiseptic cleaner which kills germs and bonds with the skin to continue killing germs even after washing.    Oral Hygiene is also important to reduce your risk of infection.    Remember - BRUSH YOUR TEETH THE MORNING OF SURGERY WITH YOUR REGULAR TOOTHPASTE  Please do not use if you have an allergy to CHG or antibacterial soaps. If your skin becomes reddened/irritated stop using the CHG.  Do not shave (including legs and underarms) for at least 48 hours prior to first CHG shower. It is OK to shave your face.  Please follow these instructions carefully.   1. Shower the NIGHT BEFORE SURGERY and the MORNING OF SURGERY with CHG.   2. If you chose to wash your hair, wash your hair first as usual with your normal shampoo.  3. After you shampoo, rinse your hair and body thoroughly to remove the shampoo.  4. Use CHG as you would any other liquid soap. You can apply CHG directly to the skin and wash gently with a scrungie or a clean washcloth.   5. Apply the CHG Soap to your body ONLY FROM THE NECK DOWN.  Do not use on open wounds or open sores. Avoid contact with your eyes, ears, mouth and genitals (private parts). Wash Face and genitals (private parts)  with your normal soap.  6. Wash thoroughly, paying special attention to the area where your surgery will be performed.  7. Thoroughly rinse your body with warm  water from the neck down.  8. DO NOT shower/wash with your normal soap after using and rinsing off the CHG Soap.  9. Pat yourself dry with a CLEAN TOWEL.  10. Wear CLEAN PAJAMAS to bed the night before surgery, wear comfortable clothes the morning of surgery  11. Place CLEAN SHEETS on your bed the night of your first shower and DO NOT SLEEP WITH PETS.  Day of Surgery:  Do not apply any deodorants/lotions.  Please wear clean clothes to the hospital/surgery center.   Remember to brush your teeth WITH YOUR REGULAR TOOTHPASTE.  Please read over the following fact sheets that you were given.

## 2018-01-16 NOTE — Progress Notes (Addendum)
PCP is Dr. Billey Gosling  LOV 12/2017 Cardio is Dr. Liane Comber LOV 11/2017.  Patient understands that she is to hold plavix (her last dose was 01/12/2018) She checks her sugars qod.  Range from 65 - 210   Last A1C 09/2017  Was 7.0 EKG 11/2017 eCHO 2016 Stress 2017 Heart cath 2018 by Dr. Linard Millers Currently denies any cp, sob.  States she does have murmur.

## 2018-01-17 NOTE — Progress Notes (Signed)
Great thank you very much for let me know.  Actually had called the lab also spoke with a technician who said they would do it.  We will see about getting it in the OR.  You have a great week and also.

## 2018-01-17 NOTE — Progress Notes (Signed)
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 BMI is consistent with obesity.  - Cardiologist [name] wrote, on 12/16/17, "I currently take care of patient Samantha [name], Samantha [name].  She was evaluated in my clinic on December 16, 2017 and is asymptomatic from chest pain or any signs of heart failure.  She underwent a cardiac catheterization in 2018 that showed only mild nonobstructive CAD. She can certainly achieve at least 4 METS.  Echo cardiogram did not show any critical valvular abnormality.  She can hold Plavix 1 week prior to the surgery. There is currently no contraindication from cardiac standpoint for her to undergo spinal stenosis surgery."  - Patient was also sent to urology for preoperative evaluation. She was seen by Dr. Lovena Neighbours on 01/03/18 for renal cysts, microscopic hematuria, recurrent UTI. He reviewed her most recent imaging, and wrote (scanned under Media tab), "Her cyst has benign features and would not inhibit her from proceeding to lumbar spinal fusion."   Last dose Plavix 01/12/18. Per notation by Benjiman Core, PA-C, patient was prescribed Cipro 500 mg BID x 10 days for abnormal UA. He tried to add on urine culture, but lab called back and could not add due to time since specimen given. Message left for Debbie at Dr. Lorin Mercy' office regarding this, and I will send Benjiman Core a  staff message as well. Defer any additional orders to surgeon.  Based on currently available information, I would anticipate that she can proceed as planned.   VS: BP (!) 106/58   Pulse 63   Temp 36.4 C   Ht 5\' 2"  (1.575 m)   Wt 87.5 kg   SpO2 100%   BMI 35.30 kg/m   PROVIDERS: [name], MD is PCP. Last visit 01/15/18 for resolving gastroenteritis, likely viral. Dr. Quay Burow is aware of surgery plans.  - Ena Dawley, MD is cardiologist. Preoperative input as above. Patient is on Imdur as she might have a component of spasm contributing to chest pain symptoms. Ellison Hughs, MD is urologist. PRN follow-up recommended after 01/03/18 visit (scanned under Media tab).   LABS: Preoperative labs noted. Surgeon is treating with Cipro for abnormal UA. (all labs ordered are listed, but only abnormal results are displayed)  Labs Reviewed  GLUCOSE, CAPILLARY - Abnormal; Notable for the following components:      Result Value   Glucose-Capillary 106 (*)    All other components within normal limits  HEMOGLOBIN A1C - Abnormal; Notable for the following components:   Hgb A1c MFr Bld 6.3 (*)    All other components within normal limits  CBC - Abnormal; Notable for the following components:   WBC 3.8 (*)    All other components within normal limits  COMPREHENSIVE METABOLIC PANEL - Abnormal; Notable for the following components:   Glucose, Bld 121 (*)    All other components within normal  limits  URINALYSIS, ROUTINE W REFLEX MICROSCOPIC - Abnormal; Notable for the following components:   APPearance HAZY (*)    Hgb urine dipstick MODERATE (*)    Nitrite POSITIVE (*)    Leukocytes, UA MODERATE (*)    Bacteria, UA MANY (*)    All other components within normal limits  SURGICAL PCR SCREEN    IMAGES: CXR 01/16/18:  Impression: No acute abnormality.   EKG: 12/16/17 ([facility]): NSR, LAD, possible lateral infarct (age undetermined). New non-specific T wave  abnormalities in the lateral leads.   CV: LHC 03/22/16:  Widely patent coronary arteries with 30-50% mid LAD narrowing and 40% mid RCA narrowing. Coronary arteries are tortuous.  Right dominant coronary anatomy. LAD wraps around the left ventricular apex.  Normal LV function. Normal LVEDP. EF 60%.  Suspect chest discomfort is not ischemic.  Echo 10/20/14: Study Conclusions - Left ventricle: The cavity size was normal. There was focal basal   and moderate concentric hypertrophy. Systolic function was   normal. The estimated ejection fraction was in the range of 60%   to 65%. Wall motion was normal; there were no regional wall   motion abnormalities. Doppler parameters are consistent with   abnormal left ventricular relaxation (grade 1 diastolic   dysfunction).  Holter monitor 07/3012: SB - SR with first degree AV block. PVCs.  Carotid U/S 06/27/12: Summary: - Findings consistent with less than 39 percent stenosis involving the right internal carotid artery and the left internal carotid artery. - Bilateral vertebral artery flow is antegrade.   Past Medical History:  Diagnosis Date  . Abnormal CT of the chest 2008   last CT4-l 2009:  . No f/u suggested   . Allergy   . Asthma   . CAD (coronary artery disease)    a. Coronary CTA 10/16: Coronary Ca score 211, mod non-obstructive CAD with LM mild plaque (25-50%), mid LAD 50-69%. b. Neg nuc 06/2015.  . Cataract    BILATERAL-REMOVED  . Chronic diastolic CHF (congestive heart failure) ([place])   . Collagen vascular disease ([place])    "arterial sclerosis" per pt  . Complication of anesthesia    trouble waking up  . Fibromyalgia   . GERD (gastroesophageal reflux disease)   . H/O hiatal hernia   . Heart murmur   . Hyperlipidemia   . Hypertension   . Malignant neoplasm of ascending colon ([place]) 2016   Minimally invasive right hemicolectomy to be done   . Neuromuscular disorder ([facility])    FIBROMYALGIA  . Ocular migraine   . OSA  (obstructive sleep apnea) 09/2007   dx w/ a sleep study, not on  CPAP  . Osteoarthritis   . Osteoporosis   . Pneumonia    "double" in 2004  . PONV (postoperative nausea and vomiting)   . Reactive airway disease 01/29/2002   dx of pseudoasthma / vcd in 2005 and nl sprirometry History of dyspnea, 2011,  improved after several medications were changed around Question of COPD, disproved July 06, 2009 with nl pft's      . Rheumatoid factor positive   . Shingles 11/2009  . Sleep apnea   . Stroke ([place])   . TIA (transient ischemic attack)    x2 - on Plavix for this  . Torn rotator cuff    right worse than left, both are torn  . Tumor, 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  . Type II diabetes mellitus ([place])   . 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  Vaginal dysplasia     Past Surgical History:  Procedure Laterality Date  . ABDOMINAL HYSTERECTOMY  1980   NO oophorectomy per pt   . ANTERIOR CERVICAL DECOMP/DISCECTOMY FUSION  2001   C 3, C4 and C5 plate and screws  . BREAST BIOPSY Right 1999  . BUNIONECTOMY Left ~ 1977  . CARDIAC CATHETERIZATION     2018 By Dr. Pernell Dupre (done after colon surgery)  . CATARACT EXTRACTION W/ INTRAOCULAR LENS  IMPLANT, BILATERAL  2012  . COLON SURGERY  10/2014  . EYE SURGERY Bilateral    torq lens for cataracts  . LEFT HEART CATH AND CORONARY ANGIOGRAPHY N/A 03/22/2016   Procedure: Left Heart Cath and Coronary Angiography;  Surgeon: Belva Crome, MD;  Location: [place] [facility];  Service: Cardiovascular;  Laterality: N/A;  . THYROIDECTOMY, PARTIAL  1960's  . VAGINAL MASS EXCISION  1994   "Laser surgery for vaginal cancer; followed by chemotherapy" (06/27/2012)    MEDICATIONS: . acetaminophen (TYLENOL) 500 MG tablet  . albuterol (PROVENTIL HFA;VENTOLIN HFA) 108 (90 Base) MCG/ACT inhaler  . atorvastatin (LIPITOR) 20 MG tablet  . budesonide-formoterol (SYMBICORT) 80-4.5 MCG/ACT inhaler  . butalbital-acetaminophen-caffeine (FIORICET) 50-325-40 MG tablet  .  CALCIUM PO  . cetirizine (ZYRTEC) 10 MG tablet  . cholecalciferol (VITAMIN D) 1000 UNITS tablet  . ciprofloxacin (CIPRO) 500 MG tablet  . clopidogrel (PLAVIX) 75 MG tablet  . hydrochlorothiazide (HYDRODIURIL) 25 MG tablet  . HYDROcodone-acetaminophen (NORCO/VICODIN) 5-325 MG tablet  . hydrocortisone 2.5 % cream  . isosorbide mononitrate (IMDUR) 60 MG 24 hr tablet  . lisinopril (PRINIVIL,ZESTRIL) 40 MG tablet  . metFORMIN (GLUCOPHAGE-XR) 500 MG 24 hr tablet  . Multiple Vitamin (MULTIVITAMIN) capsule  . nebivolol (BYSTOLIC) 10 MG tablet  . nitroGLYCERIN (NITROSTAT) 0.4 MG SL tablet  . Polyethyl Glycol-Propyl Glycol (SYSTANE) 0.4-0.3 % GEL  . Potassium 99 MG TABS  . pregabalin (LYRICA) 100 MG capsule  . promethazine (PHENERGAN) 25 MG tablet  . ranitidine (ZANTAC) 300 MG tablet   No current facility-administered medications for this encounter.     George Hugh [facility] Short Stay Center/Anesthesiology Phone [phone] 01/17/2018 11:10 AM

## 2018-01-17 NOTE — Anesthesia Preprocedure Evaluation (Addendum)
Anesthesia Evaluation  Patient identified by MRN, date of birth, ID band Patient awake    History of Anesthesia Complications (+) PONV  Airway Mallampati: II  TM Distance: >3 FB Neck ROM: Full    Dental no notable dental hx. (+) Teeth Intact, Dental Advisory Given   Pulmonary asthma , sleep apnea , former smoker,    Pulmonary exam normal breath sounds clear to auscultation       Cardiovascular hypertension, Pt. on medications and Pt. on home beta blockers + CAD  Normal cardiovascular exam Rhythm:Regular Rate:Normal  Lexascan 7/17   Nuclear stress EF: 68%.  There was no ST segment deviation noted during stress.  The study is normal.  This is a low risk study.  The left ventricular ejection fraction is hyperdynamic (>65%).     Neuro/Psych  Headaches, TIA   GI/Hepatic Neg liver ROS, GERD  Medicated,  Endo/Other  diabetes, Type 2, Oral Hypoglycemic Agents  Renal/GU negative Renal ROS     Musculoskeletal  (+) Fibromyalgia -  Abdominal (+) + obese,   Peds  Hematology negative hematology ROS (+)   Anesthesia Other Findings   Reproductive/Obstetrics                           Lab Results  Component Value Date   CREATININE 0.86 01/16/2018   BUN 14 01/16/2018   NA 142 01/16/2018   K 4.0 01/16/2018   CL 108 01/16/2018   CO2 25 01/16/2018    Lab Results  Component Value Date   WBC 3.8 (L) 01/16/2018   HGB 12.2 01/16/2018   HCT 38.3 01/16/2018   MCV 87.2 01/16/2018   PLT 152 01/16/2018    Anesthesia Physical Anesthesia Plan  ASA: III  Anesthesia Plan: General   Post-op Pain Management:    Induction: Intravenous  PONV Risk Score and Plan: 4 or greater and Treatment may vary due to age or medical condition, Ondansetron and Dexamethasone  Airway Management Planned: Oral ETT  Additional Equipment:   Intra-op Plan:   Post-operative Plan: Extubation in OR  Informed  Consent: I have reviewed the patients History and Physical, chart, labs and discussed the procedure including the risks, benefits and alternatives for the proposed anesthesia with the patient or authorized representative who has indicated his/her understanding and acceptance.   Dental advisory given  Plan Discussed with:   Anesthesia Plan Comments: (PAT note written 01/17/2018 by Myra Gianotti, PA-C. She has cardiology and urology preoperative input. )       Anesthesia Quick Evaluation

## 2018-01-20 ENCOUNTER — Observation Stay (HOSPITAL_COMMUNITY)
Admission: RE | Admit: 2018-01-20 | Discharge: 2018-01-21 | Disposition: A | Discharge status: Home / Self Care | Payer: Medicare Other | Attending: Orthopaedic Surgery | Admitting: Orthopaedic Surgery

## 2018-01-20 ENCOUNTER — Encounter (HOSPITAL_COMMUNITY): Payer: Self-pay | Admitting: *Deleted

## 2018-01-20 ENCOUNTER — Ambulatory Visit (HOSPITAL_COMMUNITY): Payer: Medicare Other | Admitting: Physician Assistant

## 2018-01-20 ENCOUNTER — Other Ambulatory Visit: Payer: Self-pay

## 2018-01-20 ENCOUNTER — Ambulatory Visit (HOSPITAL_COMMUNITY): Payer: Medicare Other

## 2018-01-20 ENCOUNTER — Ambulatory Visit (HOSPITAL_COMMUNITY): Payer: Medicare Other | Admitting: Vascular Surgery

## 2018-01-20 ENCOUNTER — Encounter (HOSPITAL_COMMUNITY)
Admission: RE | Disposition: A | Discharge status: Home / Self Care | Payer: Self-pay | Source: Home / Self Care | Attending: Orthopaedic Surgery

## 2018-01-20 DIAGNOSIS — Z9221 Personal history of antineoplastic chemotherapy: Secondary | ICD-10-CM | POA: Diagnosis not present

## 2018-01-20 DIAGNOSIS — M549 Dorsalgia, unspecified: Secondary | ICD-10-CM | POA: Diagnosis present

## 2018-01-20 DIAGNOSIS — Z8673 Personal history of transient ischemic attack (TIA), and cerebral infarction without residual deficits: Secondary | ICD-10-CM | POA: Diagnosis not present

## 2018-01-20 DIAGNOSIS — Z419 Encounter for procedure for purposes other than remedying health state, unspecified: Secondary | ICD-10-CM

## 2018-01-20 DIAGNOSIS — Z981 Arthrodesis status: Secondary | ICD-10-CM | POA: Diagnosis not present

## 2018-01-20 DIAGNOSIS — Z8589 Personal history of malignant neoplasm of other organs and systems: Secondary | ICD-10-CM | POA: Insufficient documentation

## 2018-01-20 DIAGNOSIS — I10 Essential (primary) hypertension: Secondary | ICD-10-CM | POA: Diagnosis not present

## 2018-01-20 DIAGNOSIS — Z85038 Personal history of other malignant neoplasm of large intestine: Secondary | ICD-10-CM | POA: Diagnosis not present

## 2018-01-20 DIAGNOSIS — Z79899 Other long term (current) drug therapy: Secondary | ICD-10-CM | POA: Insufficient documentation

## 2018-01-20 DIAGNOSIS — Z7984 Long term (current) use of oral hypoglycemic drugs: Secondary | ICD-10-CM | POA: Insufficient documentation

## 2018-01-20 DIAGNOSIS — M48062 Spinal stenosis, lumbar region with neurogenic claudication: Secondary | ICD-10-CM | POA: Diagnosis not present

## 2018-01-20 DIAGNOSIS — G4733 Obstructive sleep apnea (adult) (pediatric): Secondary | ICD-10-CM | POA: Diagnosis not present

## 2018-01-20 DIAGNOSIS — K219 Gastro-esophageal reflux disease without esophagitis: Secondary | ICD-10-CM | POA: Diagnosis not present

## 2018-01-20 DIAGNOSIS — M48061 Spinal stenosis, lumbar region without neurogenic claudication: Secondary | ICD-10-CM | POA: Diagnosis not present

## 2018-01-20 DIAGNOSIS — E119 Type 2 diabetes mellitus without complications: Secondary | ICD-10-CM | POA: Insufficient documentation

## 2018-01-20 DIAGNOSIS — Z87891 Personal history of nicotine dependence: Secondary | ICD-10-CM | POA: Insufficient documentation

## 2018-01-20 DIAGNOSIS — I2510 Atherosclerotic heart disease of native coronary artery without angina pectoris: Secondary | ICD-10-CM | POA: Diagnosis not present

## 2018-01-20 HISTORY — PX: LUMBAR LAMINECTOMY/DECOMPRESSION MICRODISCECTOMY: SHX5026

## 2018-01-20 LAB — URINALYSIS, COMPLETE (UACMP) WITH MICROSCOPIC
Bilirubin Urine: NEGATIVE
GLUCOSE, UA: NEGATIVE mg/dL
Ketones, ur: NEGATIVE mg/dL
Leukocytes, UA: NEGATIVE
Nitrite: NEGATIVE
PROTEIN: NEGATIVE mg/dL
Specific Gravity, Urine: 1.014 (ref 1.005–1.030)
pH: 5 (ref 5.0–8.0)

## 2018-01-20 LAB — GLUCOSE, CAPILLARY
Glucose-Capillary: 118 mg/dL — ABNORMAL HIGH (ref 70–99)
Glucose-Capillary: 113 mg/dL — ABNORMAL HIGH (ref 70–99)
Glucose-Capillary: 118 mg/dL — ABNORMAL HIGH (ref 70–99)
Glucose-Capillary: 156 mg/dL — ABNORMAL HIGH (ref 70–99)

## 2018-01-20 SURGERY — LUMBAR LAMINECTOMY/DECOMPRESSION MICRODISCECTOMY
Anesthesia: General | Site: Back

## 2018-01-20 MED ORDER — VANCOMYCIN HCL IN DEXTROSE 1-5 GM/200ML-% IV SOLN
1000.0000 mg | INTRAVENOUS | Status: AC
  Administered 2018-01-20: 1000 mg via INTRAVENOUS
  Filled 2018-01-20: qty 200

## 2018-01-20 MED ORDER — HYDROCODONE-ACETAMINOPHEN 7.5-325 MG PO TABS: 1.0000 | ORAL_TABLET | Freq: Four times a day (QID) | ORAL | 0 refills | Status: DC | PRN

## 2018-01-20 MED ORDER — PREGABALIN 100 MG PO CAPS: 100.0000 mg | ORAL_CAPSULE | Freq: Every day | ORAL | Status: DC | PRN

## 2018-01-20 MED ORDER — HYDROMORPHONE HCL 1 MG/ML IJ SOLN
0.2500 mg | INTRAMUSCULAR | Status: DC | PRN
  Administered 2018-01-20 (×2): 0.5 mg via INTRAVENOUS

## 2018-01-20 MED ORDER — NITROGLYCERIN 0.4 MG SL SUBL: 0.4000 mg | SUBLINGUAL_TABLET | SUBLINGUAL | Status: DC | PRN

## 2018-01-20 MED ORDER — DEXAMETHASONE SODIUM PHOSPHATE 10 MG/ML IJ SOLN
INTRAMUSCULAR | Status: DC | PRN
  Administered 2018-01-20: 5 mg via INTRAVENOUS

## 2018-01-20 MED ORDER — ACETAMINOPHEN 650 MG RE SUPP: 650.0000 mg | RECTAL | Status: DC | PRN

## 2018-01-20 MED ORDER — PROMETHAZINE HCL 25 MG/ML IJ SOLN: 6.2500 mg | INTRAMUSCULAR | Status: DC | PRN

## 2018-01-20 MED ORDER — MEPERIDINE HCL 50 MG/ML IJ SOLN: 6.2500 mg | INTRAMUSCULAR | Status: DC | PRN

## 2018-01-20 MED ORDER — SUGAMMADEX SODIUM 200 MG/2ML IV SOLN
INTRAVENOUS | Status: DC | PRN
  Administered 2018-01-20: 173.2 mg via INTRAVENOUS

## 2018-01-20 MED ORDER — SODIUM CHLORIDE 0.9 % IV SOLN
INTRAVENOUS | Status: DC
  Administered 2018-01-20: 17:00:00 via INTRAVENOUS

## 2018-01-20 MED ORDER — CLOPIDOGREL BISULFATE 75 MG PO TABS
75.0000 mg | ORAL_TABLET | Freq: Every day | ORAL | Status: DC
  Administered 2018-01-21: 75 mg via ORAL
  Filled 2018-01-20: qty 1

## 2018-01-20 MED ORDER — VITAMIN D 25 MCG (1000 UNIT) PO TABS
1000.0000 [IU] | ORAL_TABLET | Freq: Every day | ORAL | Status: DC
  Administered 2018-01-21: 1000 [IU] via ORAL
  Filled 2018-01-20: qty 1

## 2018-01-20 MED ORDER — BUPIVACAINE HCL (PF) 0.25 % IJ SOLN
INTRAMUSCULAR | Status: DC | PRN
  Administered 2018-01-20: 10 mL

## 2018-01-20 MED ORDER — ACETAMINOPHEN 10 MG/ML IV SOLN: 1000.0000 mg | Freq: Once | INTRAVENOUS | Status: DC | PRN

## 2018-01-20 MED ORDER — MENTHOL 3 MG MT LOZG: 1.0000 | LOZENGE | OROMUCOSAL | Status: DC | PRN

## 2018-01-20 MED ORDER — CHLORHEXIDINE GLUCONATE 4 % EX LIQD: 60.0000 mL | Freq: Once | CUTANEOUS | Status: DC

## 2018-01-20 MED ORDER — ROCURONIUM BROMIDE 10 MG/ML (PF) SYRINGE
PREFILLED_SYRINGE | INTRAVENOUS | Status: DC | PRN
  Administered 2018-01-20: 50 mg via INTRAVENOUS

## 2018-01-20 MED ORDER — FAMOTIDINE 20 MG PO TABS
20.0000 mg | ORAL_TABLET | Freq: Every day | ORAL | Status: DC
  Administered 2018-01-20: 20 mg via ORAL
  Filled 2018-01-20: qty 1

## 2018-01-20 MED ORDER — BUPIVACAINE HCL (PF) 0.25 % IJ SOLN
INTRAMUSCULAR | Status: AC
  Filled 2018-01-20: qty 30

## 2018-01-20 MED ORDER — NEBIVOLOL HCL 10 MG PO TABS
10.0000 mg | ORAL_TABLET | Freq: Every day | ORAL | Status: DC
  Administered 2018-01-21: 10 mg via ORAL
  Filled 2018-01-20: qty 1

## 2018-01-20 MED ORDER — ONDANSETRON HCL 4 MG/2ML IJ SOLN
INTRAMUSCULAR | Status: AC
  Filled 2018-01-20: qty 2

## 2018-01-20 MED ORDER — ACETAMINOPHEN 325 MG PO TABS: 650.0000 mg | ORAL_TABLET | ORAL | Status: DC | PRN

## 2018-01-20 MED ORDER — LACTATED RINGERS IV SOLN
INTRAVENOUS | Status: DC
  Administered 2018-01-20: 11:00:00 via INTRAVENOUS

## 2018-01-20 MED ORDER — ROCURONIUM BROMIDE 50 MG/5ML IV SOSY
PREFILLED_SYRINGE | INTRAVENOUS | Status: AC
  Filled 2018-01-20: qty 5

## 2018-01-20 MED ORDER — ALBUTEROL SULFATE (2.5 MG/3ML) 0.083% IN NEBU: 3.0000 mL | INHALATION_SOLUTION | Freq: Four times a day (QID) | RESPIRATORY_TRACT | Status: DC | PRN

## 2018-01-20 MED ORDER — ONDANSETRON HCL 4 MG/2ML IJ SOLN: 4.0000 mg | Freq: Four times a day (QID) | INTRAMUSCULAR | Status: DC | PRN

## 2018-01-20 MED ORDER — METFORMIN HCL ER 500 MG PO TB24
500.0000 mg | ORAL_TABLET | Freq: Every day | ORAL | Status: DC
  Administered 2018-01-21: 500 mg via ORAL
  Filled 2018-01-20: qty 1

## 2018-01-20 MED ORDER — HYDROCODONE-ACETAMINOPHEN 7.5-325 MG PO TABS: 1.0000 | ORAL_TABLET | Freq: Once | ORAL | Status: DC | PRN

## 2018-01-20 MED ORDER — DOCUSATE SODIUM 100 MG PO CAPS
100.0000 mg | ORAL_CAPSULE | Freq: Two times a day (BID) | ORAL | Status: DC
  Administered 2018-01-20 – 2018-01-21 (×2): 100 mg via ORAL
  Filled 2018-01-20 (×2): qty 1

## 2018-01-20 MED ORDER — ONDANSETRON HCL 4 MG PO TABS
4.0000 mg | ORAL_TABLET | Freq: Four times a day (QID) | ORAL | Status: DC | PRN
  Filled 2018-01-20: qty 1

## 2018-01-20 MED ORDER — 0.9 % SODIUM CHLORIDE (POUR BTL) OPTIME
TOPICAL | Status: DC | PRN
  Administered 2018-01-20: 1000 mL

## 2018-01-20 MED ORDER — LIDOCAINE 2% (20 MG/ML) 5 ML SYRINGE
INTRAMUSCULAR | Status: DC | PRN
  Administered 2018-01-20: 100 mg via INTRAVENOUS

## 2018-01-20 MED ORDER — KETAMINE HCL 50 MG/5ML IJ SOSY
PREFILLED_SYRINGE | INTRAMUSCULAR | Status: AC
  Filled 2018-01-20: qty 5

## 2018-01-20 MED ORDER — HYDROMORPHONE HCL 1 MG/ML IJ SOLN
INTRAMUSCULAR | Status: AC
  Filled 2018-01-20: qty 1

## 2018-01-20 MED ORDER — ATORVASTATIN CALCIUM 10 MG PO TABS
20.0000 mg | ORAL_TABLET | Freq: Every day | ORAL | Status: DC
  Administered 2018-01-20: 20 mg via ORAL
  Filled 2018-01-20: qty 2

## 2018-01-20 MED ORDER — SODIUM CHLORIDE 0.9 % IV SOLN: 250.0000 mL | INTRAVENOUS | Status: DC

## 2018-01-20 MED ORDER — HYDROCODONE-ACETAMINOPHEN 7.5-325 MG PO TABS
1.0000 | ORAL_TABLET | ORAL | Status: DC | PRN
  Administered 2018-01-20 – 2018-01-21 (×3): 1 via ORAL
  Filled 2018-01-20 (×6): qty 1

## 2018-01-20 MED ORDER — ISOSORBIDE MONONITRATE ER 60 MG PO TB24
60.0000 mg | ORAL_TABLET | Freq: Every day | ORAL | Status: DC
  Administered 2018-01-21: 60 mg via ORAL
  Filled 2018-01-20: qty 1

## 2018-01-20 MED ORDER — ONDANSETRON HCL 4 MG/2ML IJ SOLN
INTRAMUSCULAR | Status: DC | PRN
  Administered 2018-01-20: 4 mg via INTRAVENOUS

## 2018-01-20 MED ORDER — LORATADINE 10 MG PO TABS
10.0000 mg | ORAL_TABLET | Freq: Every day | ORAL | Status: DC
  Administered 2018-01-20 – 2018-01-21 (×2): 10 mg via ORAL
  Filled 2018-01-20 (×2): qty 1

## 2018-01-20 MED ORDER — SODIUM CHLORIDE 0.9% FLUSH
3.0000 mL | Freq: Two times a day (BID) | INTRAVENOUS | Status: DC
  Administered 2018-01-20: 3 mL via INTRAVENOUS

## 2018-01-20 MED ORDER — PHENYLEPHRINE 40 MCG/ML (10ML) SYRINGE FOR IV PUSH (FOR BLOOD PRESSURE SUPPORT)
PREFILLED_SYRINGE | INTRAVENOUS | Status: AC
  Filled 2018-01-20: qty 10

## 2018-01-20 MED ORDER — KETAMINE HCL 50 MG/ML IJ SOLN
INTRAMUSCULAR | Status: DC | PRN
  Administered 2018-01-20: 15 mg via INTRAMUSCULAR
  Administered 2018-01-20: 25 mg via INTRAMUSCULAR

## 2018-01-20 MED ORDER — SODIUM CHLORIDE 0.9% FLUSH: 3.0000 mL | INTRAVENOUS | Status: DC | PRN

## 2018-01-20 MED ORDER — HYDROCHLOROTHIAZIDE 25 MG PO TABS
12.5000 mg | ORAL_TABLET | Freq: Every day | ORAL | Status: DC
  Administered 2018-01-21: 12.5 mg via ORAL
  Filled 2018-01-20: qty 1

## 2018-01-20 MED ORDER — GABAPENTIN 300 MG PO CAPS
300.0000 mg | ORAL_CAPSULE | Freq: Once | ORAL | Status: AC
  Administered 2018-01-20: 300 mg via ORAL
  Filled 2018-01-20: qty 1

## 2018-01-20 MED ORDER — VANCOMYCIN HCL IN DEXTROSE 750-5 MG/150ML-% IV SOLN
750.0000 mg | Freq: Once | INTRAVENOUS | Status: AC
  Administered 2018-01-20: 750 mg via INTRAVENOUS
  Filled 2018-01-20: qty 150

## 2018-01-20 MED ORDER — FENTANYL CITRATE (PF) 250 MCG/5ML IJ SOLN
INTRAMUSCULAR | Status: AC
  Filled 2018-01-20: qty 5

## 2018-01-20 MED ORDER — KETAMINE HCL-SODIUM CHLORIDE 100-0.9 MG/10ML-% IV SOSY
0.5000 mg/kg | PREFILLED_SYRINGE | Freq: Once | INTRAVENOUS | Status: DC
  Filled 2018-01-20: qty 10

## 2018-01-20 MED ORDER — DEXAMETHASONE SODIUM PHOSPHATE 10 MG/ML IJ SOLN
INTRAMUSCULAR | Status: AC
  Filled 2018-01-20: qty 1

## 2018-01-20 MED ORDER — PROPOFOL 10 MG/ML IV BOLUS
INTRAVENOUS | Status: DC | PRN
  Administered 2018-01-20: 140 mg via INTRAVENOUS

## 2018-01-20 MED ORDER — MOMETASONE FURO-FORMOTEROL FUM 100-5 MCG/ACT IN AERO
2.0000 | INHALATION_SPRAY | Freq: Two times a day (BID) | RESPIRATORY_TRACT | Status: DC
  Administered 2018-01-20 – 2018-01-21 (×2): 2 via RESPIRATORY_TRACT
  Filled 2018-01-20: qty 8.8

## 2018-01-20 MED ORDER — PHENOL 1.4 % MT LIQD: 1.0000 | OROMUCOSAL | Status: DC | PRN

## 2018-01-20 MED ORDER — SODIUM CHLORIDE 0.9 % IV SOLN
INTRAVENOUS | Status: DC | PRN
  Administered 2018-01-20: 25 ug/min via INTRAVENOUS

## 2018-01-20 MED ORDER — POLYVINYL ALCOHOL 1.4 % OP SOLN
1.0000 [drp] | Freq: Every day | OPHTHALMIC | Status: DC | PRN
  Filled 2018-01-20: qty 15

## 2018-01-20 MED ORDER — PROPOFOL 10 MG/ML IV BOLUS
INTRAVENOUS | Status: AC
  Filled 2018-01-20: qty 20

## 2018-01-20 MED ORDER — PHENYLEPHRINE 40 MCG/ML (10ML) SYRINGE FOR IV PUSH (FOR BLOOD PRESSURE SUPPORT)
PREFILLED_SYRINGE | INTRAVENOUS | Status: DC | PRN
  Administered 2018-01-20 (×4): 80 ug via INTRAVENOUS

## 2018-01-20 MED ORDER — LISINOPRIL 40 MG PO TABS
40.0000 mg | ORAL_TABLET | Freq: Every day | ORAL | Status: DC
  Administered 2018-01-21: 40 mg via ORAL
  Filled 2018-01-20: qty 1

## 2018-01-20 MED ORDER — FENTANYL CITRATE (PF) 250 MCG/5ML IJ SOLN
INTRAMUSCULAR | Status: DC | PRN
  Administered 2018-01-20: 100 ug via INTRAVENOUS

## 2018-01-20 SURGICAL SUPPLY — 48 items
BUR ROUND FLUTED 4 SOFT TCH (BURR) ×1 IMPLANT
CABLE BIPOLOR RESECTION CORD (MISCELLANEOUS) ×1 IMPLANT
CANISTER SUCT 3000ML PPV (MISCELLANEOUS) ×2 IMPLANT
CLSR STERI-STRIP ANTIMIC 1/2X4 (GAUZE/BANDAGES/DRESSINGS) ×2 IMPLANT
COVER SURGICAL LIGHT HANDLE (MISCELLANEOUS) ×2 IMPLANT
COVER WAND RF STERILE (DRAPES) ×2 IMPLANT
DECANTER SPIKE VIAL GLASS SM (MISCELLANEOUS) ×2 IMPLANT
DRAPE HALF SHEET 40X57 (DRAPES) ×4 IMPLANT
DRAPE INCISE IOBAN 66X45 STRL (DRAPES) ×1 IMPLANT
DRAPE MICROSCOPE LEICA (MISCELLANEOUS) ×2 IMPLANT
DRAPE SURG 17X23 STRL (DRAPES) ×2 IMPLANT
DRSG MEPILEX BORDER 4X4 (GAUZE/BANDAGES/DRESSINGS) ×2 IMPLANT
DRSG MEPITEL 3X4 ME34 (GAUZE/BANDAGES/DRESSINGS) ×1 IMPLANT
DURAPREP 26ML APPLICATOR (WOUND CARE) ×2 IMPLANT
ELECT BLADE 4.0 EZ CLEAN MEGAD (MISCELLANEOUS) ×2
ELECT REM PT RETURN 9FT ADLT (ELECTROSURGICAL) ×2
ELECTRODE BLDE 4.0 EZ CLN MEGD (MISCELLANEOUS) IMPLANT
ELECTRODE REM PT RTRN 9FT ADLT (ELECTROSURGICAL) ×1 IMPLANT
GLOVE BIOGEL M 6.5 STRL (GLOVE) ×1 IMPLANT
GLOVE BIOGEL PI IND STRL 8 (GLOVE) ×2 IMPLANT
GLOVE BIOGEL PI INDICATOR 8 (GLOVE) ×4
GLOVE ORTHO TXT STRL SZ7.5 (GLOVE) ×4 IMPLANT
GOWN STRL REUS W/ TWL LRG LVL3 (GOWN DISPOSABLE) ×2 IMPLANT
GOWN STRL REUS W/ TWL XL LVL3 (GOWN DISPOSABLE) ×1 IMPLANT
GOWN STRL REUS W/TWL 2XL LVL3 (GOWN DISPOSABLE) ×2 IMPLANT
GOWN STRL REUS W/TWL LRG LVL3 (GOWN DISPOSABLE) ×4
GOWN STRL REUS W/TWL XL LVL3 (GOWN DISPOSABLE) ×2
KIT BASIN OR (CUSTOM PROCEDURE TRAY) ×2 IMPLANT
KIT TURNOVER KIT B (KITS) ×2 IMPLANT
MANIFOLD NEPTUNE II (INSTRUMENTS) ×2 IMPLANT
NDL HYPO 25GX1X1/2 BEV (NEEDLE) ×1 IMPLANT
NDL SPNL 18GX3.5 QUINCKE PK (NEEDLE) ×1 IMPLANT
NEEDLE HYPO 25GX1X1/2 BEV (NEEDLE) ×2 IMPLANT
NEEDLE SPNL 18GX3.5 QUINCKE PK (NEEDLE) ×2 IMPLANT
NS IRRIG 1000ML POUR BTL (IV SOLUTION) ×2 IMPLANT
PACK LAMINECTOMY ORTHO (CUSTOM PROCEDURE TRAY) ×2 IMPLANT
PAD ARMBOARD 7.5X6 YLW CONV (MISCELLANEOUS) ×4 IMPLANT
PATTIES SURGICAL .5 X.5 (GAUZE/BANDAGES/DRESSINGS) ×1 IMPLANT
PATTIES SURGICAL .75X.75 (GAUZE/BANDAGES/DRESSINGS) ×1 IMPLANT
SUT VIC AB 0 CT1 27 (SUTURE)
SUT VIC AB 0 CT1 27XBRD ANBCTR (SUTURE) IMPLANT
SUT VIC AB 1 CTX 36 (SUTURE) ×2
SUT VIC AB 1 CTX36XBRD ANBCTR (SUTURE) ×1 IMPLANT
SUT VIC AB 2-0 CT1 27 (SUTURE) ×2
SUT VIC AB 2-0 CT1 TAPERPNT 27 (SUTURE) ×1 IMPLANT
SUT VIC AB 3-0 X1 27 (SUTURE) ×2 IMPLANT
TOWEL OR 17X24 6PK STRL BLUE (TOWEL DISPOSABLE) ×2 IMPLANT
TOWEL OR 17X26 10 PK STRL BLUE (TOWEL DISPOSABLE) ×2 IMPLANT

## 2018-01-20 NOTE — Progress Notes (Signed)
Pharmacy Antibiotic Note  Samantha [name] is a 77 y.o. female admitted on 01/20/2018 for lumbar laminectomy. Cephalosporin allergy.  Pharmacy has been consulted for Vancomycin x 1 dose post-op for surgical prophylaxis. No drain.  Vancomycin 1gm IV given pre-op ~11:30am.    Hx recurrent UTI and Cipro prescribed x 10 days on 01/16/18. Not reordered post-op.   Urine culture to be sent.   Plan:  Vancomycin 750 mg IV x 1 ~11pm tonight.  Will follow up urine culture.   Height: 5\' 2"  (157.5 cm) Weight: 191 lb (86.6 kg) IBW/kg (Calculated) : 50.1  Temp (24hrs), Avg:97.7 F (36.5 C), Min:97.4 F (36.3 C), Max:98.5 F (36.9 C)  Recent Labs  Lab 01/16/18 1025  WBC 3.8*  CREATININE 0.86    Estimated Creatinine Clearance: 57.7 mL/min (by C-G formula based on SCr of 0.86 mg/dL).    Allergies  Allergen Reactions  . Bactrim [Sulfamethoxazole-Trimethoprim] Other (See Comments)    See OV 09-15-13, rash-tongue swelling due to bactrim ?  [name] Cefuroxime Axetil Hives  . Oxycodone Nausea And Vomiting  . Pravastatin Other (See Comments)    "muscle breakdown" with profuse sweating  . Seldane [Terfenadine] Hives  . Zocor [Simvastatin] Other (See Comments)    2012 "Muscle breakdown " with profuse sweating  . Tramadol Nausea And Vomiting  . Lime Flavor [Flavoring Agent] Diarrhea  . Tape Other (See Comments)    blisters    Antimicrobials this admission:  Vancomycin x 2 doses on 12/23 (pre= and post-op)  Microbiology results:  12/23 urine -  Thank you for allowing pharmacy to be a part of this patient's care.  Arty Baumgartner, [place] Pager: [phone] or phone: [phone] 01/20/2018 4:45 PM

## 2018-01-20 NOTE — Interval H&P Note (Signed)
History and Physical Interval Note:  01/20/2018 12:16 PM  [place]  has presented today for surgery, with the diagnosis of L4-5 stenosis  The various methods of treatment have been discussed with the patient and family. After consideration of risks, benefits and other options for treatment, the patient has consented to  Procedure(s): L4-5 decompression (N/A) as a surgical intervention .  The patient's history has been reviewed, patient examined, no change in status, stable for surgery.  I have reviewed the patient's chart and labs.  Questions were answered to the patient's satisfaction.     Samantha [name]

## 2018-01-20 NOTE — Transfer of Care (Signed)
Immediate Anesthesia Transfer of Care Note  Patient: Samantha [name]  Procedure(s) Performed: L4-5 decompression (N/A Back)  Patient Location: PACU  Anesthesia Type:General  Level of Consciousness: awake, alert , drowsy and patient cooperative  Airway & Oxygen Therapy: Patient Spontanous Breathing and Patient connected to nasal cannula oxygen  Post-op Assessment: Report given to RN and Post -op Vital signs reviewed and stable  Post vital signs: Reviewed and stable  Last Vitals:  Vitals Value Taken Time  BP 140/74 01/20/2018  2:09 PM  Temp    Pulse 69 01/20/2018  2:09 PM  Resp    SpO2 100 % 01/20/2018  2:09 PM  Vitals shown include unvalidated device data.  Last Pain:  Vitals:   01/20/18 1107  TempSrc:   PainSc: 0-No pain         Complications: No apparent anesthesia complications

## 2018-01-20 NOTE — Discharge Instructions (Addendum)
Ok to shower 1 days postop.  Do not apply any creams or ointments to incision.  Do not remove steri-strips.  Can use 4x4 gauze and tape for dressing changes.  No aggressive activity.  No bending, squatting or prolonged sitting.  Mostly be in reclined position or  lying down. Change dressing only if hospital dressing is coming off, you can leave it on and shower.     No driving,lifting

## 2018-01-20 NOTE — Anesthesia Procedure Notes (Signed)
Procedure Name: Intubation Date/Time: 01/20/2018 12:37 PM Performed by: Renato Shin, CRNA Pre-anesthesia Checklist: Patient identified, Emergency Drugs available, Patient being monitored and Suction available Patient Re-evaluated:Patient Re-evaluated prior to induction Oxygen Delivery Method: Circle system utilized Preoxygenation: Pre-oxygenation with 100% oxygen Induction Type: IV induction Ventilation: Mask ventilation without difficulty and Oral airway inserted - appropriate to patient size Laryngoscope Size: Sabra Heck and 2 Grade View: Grade I Tube type: Oral Tube size: 7.0 mm Number of attempts: 1 Airway Equipment and Method: Stylet Placement Confirmation: ETT inserted through vocal cords under direct vision,  positive ETCO2 and breath sounds checked- equal and bilateral Secured at: 22 cm Tube secured with: Tape Dental Injury: Teeth and Oropharynx as per pre-operative assessment

## 2018-01-20 NOTE — Op Note (Addendum)
Preop diagnosis: L4-5 spinal stenosis  Postop diagnosis: Same  Procedure: Laminectomy, complete L4 partial L5 for decompression for spinal stenosis.  Microscope assisted.  Surgeon: Rodell Perna, MD  Assistant Benjiman Core, PA-C medically necessary and present for the entire procedure  Anesthesia General plus Marcaine local  History 77 year old female with type 2 diabetes history of heart failure with rate 1 anterolisthesis without instability with severe facet arthropathy and ligamentum redundancy causing severe canal stenosis.  She had neurogenic claudication symptoms failed conservative treatment including epidural injections.  Procedure: After induction general anesthesia with the patient prone on chest rolls arms at 9090 with rolled shoulder pads pads over the ulnar nerve calf pumpers preoperative vancomycin due to her allergies and timeout procedure was completed.  Back was prepped with DuraPrep area squared with towels Betadine Steri-Drape applied in laminectomy sheet and draped.  Spinal needle was placed and crosstable lateral x-ray was taken needle was adjusted more caudally repeat image was performed skin was sterilely marked and midline incision was made.  Subperiosteal dissection down to the lamina and then placement of Kocher clamps and repeat imaging was performed with lateral x-ray and bone was directly marked after the image.  Laminectomy was performed at L4 there was significant thickening of the lamina spinous process was removed with a rondure and lamina was thinned with a Leksell rondure.  Operative microscope was draped and brought in the areas of the dura scarred down and adherent to the dura.  Microdissection technique releasing him with hockey-stick and #4 pin field and then remove the thick chunks of ligament.  4 mm round bur was used to thin the lamina and thick hypertrophic chunks of ligament were removed rounded the dura out to the level of the pedicles.  Overhanging facets were  trimmed back to the level of the pedicle and lateral recess was decompressed right and left.  Dural tube was intact.  There was no significant epidural venous bleeders in the gutter.  Disc was not herniated.  Copious irrigation and then standard layer closure with #1 Vicryl in the deep fascia 2-0 Vicryl subtenons tissue Marcaine infiltration and skin closure with staples postop dressing and transferred recovery room.

## 2018-01-20 NOTE — H&P (Signed)
Samantha [name] is an 77 y.o. female.   Chief Complaint: Back pain and leg pain HPI: 77 year old female with history of L4-5 stenosis and the above complaint presents for surgical invention.  Progressively worsening symptoms.  Failed conservative treatment.  Past Medical History:  Diagnosis Date  . Abnormal CT of the chest 2008   last CT4-l 2009:  . No f/u suggested   . Allergy   . Asthma   . CAD (coronary artery disease)    a. Coronary CTA 10/16: Coronary Ca score 211, mod non-obstructive CAD with LM mild plaque (25-50%), mid LAD 50-69%. b. Neg nuc 06/2015.  . Cataract    BILATERAL-REMOVED  . Chronic diastolic CHF (congestive heart failure) ([place])   . Collagen vascular disease ([place])    "arterial sclerosis" per pt  . Complication of anesthesia    trouble waking up  . Fibromyalgia   . GERD (gastroesophageal reflux disease)   . H/O hiatal hernia   . Heart murmur   . Hyperlipidemia   . Hypertension   . Malignant neoplasm of ascending colon ([place]) 2016   Minimally invasive right hemicolectomy to be done   . Neuromuscular disorder (HCC)    FIBROMYALGIA  . Ocular migraine   . OSA (obstructive sleep apnea) 09/2007   dx w/ a sleep study, not on  CPAP  . Osteoarthritis   . Osteoporosis   . Pneumonia    "double" in 2004  . PONV (postoperative nausea and vomiting)   . Reactive airway disease 01/29/2002   dx of pseudoasthma / vcd in 2005 and nl sprirometry History of dyspnea, 2011,  improved after several medications were changed around Question of COPD, disproved July 06, 2009 with nl pft's      . Rheumatoid factor positive   . Shingles 11/2009  . Sleep apnea   . Stroke ([place])   . TIA (transient ischemic attack)    x2 - on Plavix for this  . Torn rotator cuff    right worse than left, both are torn  . Tumor, 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  . Type II diabetes mellitus ([place])   . 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 Vaginal dysplasia     Past Surgical History:  Procedure  Laterality Date  . ABDOMINAL HYSTERECTOMY  1980   NO oophorectomy per pt   . ANTERIOR CERVICAL DECOMP/DISCECTOMY FUSION  2001   C 3, C4 and C5 plate and screws  . BREAST BIOPSY Right 1999  . BUNIONECTOMY Left ~ 1977  . CARDIAC CATHETERIZATION     2018 By Dr. Pernell Dupre (done after colon surgery)  . CATARACT EXTRACTION W/ INTRAOCULAR LENS  IMPLANT, BILATERAL  2012  . COLON SURGERY  10/2014  . EYE SURGERY Bilateral    torq lens for cataracts  . LEFT HEART CATH AND CORONARY ANGIOGRAPHY N/A 03/22/2016   Procedure: Left Heart Cath and Coronary Angiography;  Surgeon: Belva Crome, MD;  Location: [facility];  Service: Cardiovascular;  Laterality: N/A;  . THYROIDECTOMY, PARTIAL  1960's  . VAGINAL MASS EXCISION  1994   "Laser surgery for vaginal cancer; followed by chemotherapy" (06/27/2012)    Family History  Problem Relation Age of Onset  . Heart disease Father   . Heart disease Mother   . Lung cancer Mother   . Allergies Sister   . Parkinsonism Sister        possible  . Asthma Sister   . Asthma Paternal Grandmother   .  Stroke Paternal Grandmother   . Heart disease Other        paternal grandparents, maternal grandparents,   . Heart disease Brother   . Emphysema Brother   . Aneurysm Brother        x3  . Kidney failure Brother   . Diabetes Brother   . Diabetes Brother   . Stroke Brother   . Stroke Maternal Grandmother   . Breast cancer Neg Hx   . Colon cancer Neg Hx   . Heart attack Neg Hx    Social History:  reports that she quit smoking about 19 years ago. Her smoking use included cigarettes. She has a 1.25 pack-year smoking history. She has never used smokeless tobacco. She reports that she does not drink alcohol or use drugs.  Allergies:  Allergies  Allergen Reactions  . Bactrim [Sulfamethoxazole-Trimethoprim] Other (See Comments)    See OV 09-15-13, rash-tongue swelling due to bactrim ?  [name] Cefuroxime Axetil Hives  . Oxycodone Nausea And Vomiting  .  Pravastatin Other (See Comments)    "muscle breakdown" with profuse sweating  . Seldane [Terfenadine] Hives  . Zocor [Simvastatin] Other (See Comments)    2012 "Muscle breakdown " with profuse sweating  . Tramadol Nausea And Vomiting  . Lime Flavor [Flavoring Agent] Diarrhea  . Tape Other (See Comments)    blisters    No medications prior to admission.    No results found for this or any previous visit (from the past 48 hour(s)). No results found.  Review of Systems  Constitutional: Negative.   HENT: Negative.   Respiratory:       Admits exertional dyspnea  Cardiovascular: Negative.   Genitourinary: Negative.   Musculoskeletal: Negative.   Skin: Negative.   Neurological: Negative.   Psychiatric/Behavioral: Negative.     There were no vitals taken for this visit. Physical Exam  Constitutional: She is oriented to person, place, and time. No distress.  HENT:  Head: Normocephalic.  Eyes: Pupils are equal, round, and reactive to light. EOM are normal.  Neck: Normal range of motion.  Cardiovascular: Normal heart sounds.  Respiratory: No respiratory distress. She has no wheezes.  GI: She exhibits no distension.  Musculoskeletal:        General: Tenderness present.  Neurological: She is alert and oriented to person, place, and time.  Skin: Skin is warm and dry.     Assessment/Plan L4-5 stenosis, back pain and leg pain.  We will proceed with L4-5 decompression as scheduled.  Surgery along with potential rehab/recovery time discussed.  All questions answered and wishes to proceed  Benjiman Core, PA-C 01/20/2018, 10:01 AM

## 2018-01-21 ENCOUNTER — Encounter (HOSPITAL_COMMUNITY): Payer: Self-pay | Admitting: Orthopaedic Surgery

## 2018-01-21 DIAGNOSIS — I2510 Atherosclerotic heart disease of native coronary artery without angina pectoris: Secondary | ICD-10-CM | POA: Diagnosis not present

## 2018-01-21 DIAGNOSIS — M48061 Spinal stenosis, lumbar region without neurogenic claudication: Secondary | ICD-10-CM | POA: Diagnosis not present

## 2018-01-21 DIAGNOSIS — Z85038 Personal history of other malignant neoplasm of large intestine: Secondary | ICD-10-CM | POA: Diagnosis not present

## 2018-01-21 DIAGNOSIS — I10 Essential (primary) hypertension: Secondary | ICD-10-CM | POA: Diagnosis not present

## 2018-01-21 DIAGNOSIS — G4733 Obstructive sleep apnea (adult) (pediatric): Secondary | ICD-10-CM | POA: Diagnosis not present

## 2018-01-21 DIAGNOSIS — E119 Type 2 diabetes mellitus without complications: Secondary | ICD-10-CM | POA: Diagnosis not present

## 2018-01-21 LAB — BASIC METABOLIC PANEL
ANION GAP: 9 (ref 5–15)
BUN: 13 mg/dL (ref 8–23)
CO2: 23 mmol/L (ref 22–32)
Calcium: 8.7 mg/dL — ABNORMAL LOW (ref 8.9–10.3)
Chloride: 104 mmol/L (ref 98–111)
Creatinine, Ser: 0.91 mg/dL (ref 0.44–1.00)
GFR calc non Af Amer: >60 mL/min (ref >60)
Glucose, Bld: 136 mg/dL — ABNORMAL HIGH (ref 70–99)
POTASSIUM: 3.8 mmol/L (ref 3.5–5.1)
Sodium: 136 mmol/L (ref 135–145)

## 2018-01-21 LAB — GLUCOSE, CAPILLARY
Glucose-Capillary: 134 mg/dL — ABNORMAL HIGH (ref 70–99)
Glucose-Capillary: 127 mg/dL — ABNORMAL HIGH (ref 70–99)

## 2018-01-21 LAB — URINE CULTURE
Culture: NO GROWTH
Culture: NO GROWTH

## 2018-01-21 NOTE — Care Management Note (Signed)
Case Management Note  Patient Details  Name: Samantha [name] MRN: [id number] Date of Birth: 06-20-1940  Subjective/Objective:    77 yr old female s/p L4-5 Laminectomy/decompression.                 Action/Plan: Case manager spoke with patient concerning discharge plan and DME. Per Dr. Lorin Mercy, patient will not need [facility]., She will have family support at discharge.  Expected Discharge Date:  01/21/18               Expected Discharge Plan:  Home/Self Care  In-House Referral:  NA  Discharge planning Services  CM Consult  Post Acute Care Choice:  Durable Medical Equipment Choice offered to:  Patient  DME Arranged:  3-N-1 DME Agency:  [employer]  HH Arranged:  NA HH Agency:  NA  Status of Service:  Completed, signed off  If discussed at [place] of Stay Meetings, dates discussed:    Additional Comments:  Ninfa Meeker, RN 01/21/2018, 12:40 PM

## 2018-01-21 NOTE — Progress Notes (Signed)
Pt alert, oriented. No acute distress noted. Medicated for pain prior to discharge. Pt in stable condition. Discharged home with home health. Escorted to private vehicle by staff. Pt verbalized understanding of discharge teaching

## 2018-01-21 NOTE — Progress Notes (Signed)
Patient ID: Samantha [name], female   DOB: 1940-03-30, 77 y.o.   MRN: [id number] Voiding . Walking with nurse  Discharge home . Office one week .

## 2018-01-21 NOTE — Plan of Care (Signed)
  Problem: Education: Goal: Ability to verbalize activity precautions or restrictions will improve Outcome: Adequate for Discharge Goal: Knowledge of the prescribed therapeutic regimen will improve Outcome: Adequate for Discharge Goal: Understanding of discharge needs will improve Outcome: Adequate for Discharge   Problem: Activity: Goal: Ability to avoid complications of mobility impairment will improve Outcome: Adequate for Discharge Goal: Ability to tolerate increased activity will improve Outcome: Adequate for Discharge Goal: Will remain free from falls Outcome: Adequate for Discharge   Problem: Bowel/Gastric: Goal: Gastrointestinal status for postoperative course will improve Outcome: Adequate for Discharge   Problem: Clinical Measurements: Goal: Ability to maintain clinical measurements within normal limits will improve Outcome: Adequate for Discharge Goal: Postoperative complications will be avoided or minimized Outcome: Adequate for Discharge Goal: Diagnostic test results will improve Outcome: Adequate for Discharge   Problem: Pain Management: Goal: Pain level will decrease Outcome: Adequate for Discharge   Problem: Skin Integrity: Goal: Will show signs of wound healing Outcome: Adequate for Discharge   Problem: Health Behavior/Discharge Planning: Goal: Identification of resources available to assist in meeting health care needs will improve Outcome: Adequate for Discharge   Problem: Bladder/Genitourinary: Goal: Urinary functional status for postoperative course will improve Outcome: Adequate for Discharge   

## 2018-01-21 NOTE — Anesthesia Postprocedure Evaluation (Signed)
Anesthesia Post Note  Patient: Roxine Whittinghill Baumgart  Procedure(s) Performed: L4-5 decompression (N/A Back)     Patient location during evaluation: PACU Anesthesia Type: General Level of consciousness: awake and alert Pain management: pain level controlled Vital Signs Assessment: post-procedure vital signs reviewed and stable Respiratory status: spontaneous breathing, nonlabored ventilation, respiratory function stable and patient connected to nasal cannula oxygen Cardiovascular status: blood pressure returned to baseline and stable Postop Assessment: no apparent nausea or vomiting Anesthetic complications: no    Last Vitals:  Vitals:   01/20/18 2041 01/21/18 0425  BP:  (!) 133/107  Pulse:  76  Resp:  18  Temp:  36.7 C  SpO2: 97% 95%    Last Pain:  Vitals:   01/21/18 0425  TempSrc: Oral  PainSc:                  Barnet [place]

## 2018-01-21 NOTE — Care Management Obs Status (Signed)
[facility] NOTIFICATION   Patient Details  Name: Samantha [name] MRN: [id number] Date of Birth: 17-Oct-1940   Medicare Observation Status Notification Given:  Yes    Ninfa Meeker, RN 01/21/2018, 10:44 AM

## 2018-01-28 ENCOUNTER — Encounter (INDEPENDENT_AMBULATORY_CARE_PROVIDER_SITE_OTHER): Payer: Self-pay | Admitting: Orthopaedic Surgery

## 2018-01-28 ENCOUNTER — Ambulatory Visit (INDEPENDENT_AMBULATORY_CARE_PROVIDER_SITE_OTHER): Payer: Medicare Other | Admitting: Orthopaedic Surgery

## 2018-01-28 VITALS — BP 122/76 | HR 73 | Ht 62.0 in | Wt 191.0 lb

## 2018-01-28 DIAGNOSIS — M1711 Unilateral primary osteoarthritis, right knee: Secondary | ICD-10-CM | POA: Insufficient documentation

## 2018-01-28 DIAGNOSIS — Z9889 Other specified postprocedural states: Secondary | ICD-10-CM | POA: Insufficient documentation

## 2018-01-28 MED ORDER — HYDROCODONE-ACETAMINOPHEN 5-325 MG PO TABS: 1.0000 | ORAL_TABLET | Freq: Four times a day (QID) | ORAL | 0 refills | Status: DC | PRN

## 2018-01-28 NOTE — Progress Notes (Signed)
Post-Op Visit Note   Patient: Samantha [name]           Date of Birth: 02-27-1940           MRN: [id number] Visit Date: 01/28/2018 PCP: [name], MD   Assessment & Plan: Follow-up L 4-5 decompression for spinal stenosis 01/20/2018.  We will switch her from [place] 7.5 to [place].  Recheck 4 weeks.  She has a severe arthritis right knee with valgus and she wants to consider having this done in the spring or early summer.  I will recheck her in 4 weeks.  On return visit we will repeat standing AP x-rays both knees and lateral and patellar sunrise view right knee.  Chief Complaint:  Chief Complaint  Patient presents with  . Lower Back - Routine Post Op    01/20/18 L4-5 Decompression   Visit Diagnoses:  1. Unilateral primary osteoarthritis, right knee   2. Status post lumbar spine surgery for decompression of spinal cord     Plan: Staples removed Steri-Strips applied continue walking with a walker gradually work her way from the walker to a cane as tolerated  Follow-Up Instructions: Return in about 4 weeks (around 02/25/2018).   Orders:  No orders of the defined types were placed in this encounter.  No orders of the defined types were placed in this encounter.   Imaging: No results found.  PMFS History: Patient Active Problem List   Diagnosis Date Noted  . Unilateral primary osteoarthritis, right knee 01/28/2018  . Status post lumbar spine surgery for decompression of spinal cord 01/28/2018  . Lumbar stenosis 01/20/2018  . Viral gastroenteritis 01/15/2018  . Spinal stenosis of lumbar region 12/11/2017  . Spinal stenosis of lumbosacral region 11/26/2017  . Acute left-sided low back pain without sciatica 11/10/2017  . GERD (gastroesophageal reflux disease) 10/23/2017  . Candidiasis of skin 10/16/2017  . Sacral back pain 10/16/2017  . Epistaxis 07/23/2017  . Headache 07/23/2017  . Chronic left upper quadrant pain 07/23/2017  . Rectal bleeding 07/23/2017  .  Poor balance 03/14/2017  . Sprain of right ankle 03/14/2017  . Right shoulder pain 03/14/2017  . Hair loss 10/19/2016  . Difficulty urinating 04/18/2016  . Angina pectoris ([place]) 03/22/2016  . Spondylosis of lumbar region without myelopathy or radiculopathy 02/02/2016  . CAD in native artery 08/22/2015  . Hypertensive heart disease 07/07/2015  . Essential hypertension 07/07/2015  . SOB (shortness of breath) 07/07/2015  . Dizziness 07/07/2015  . Plantar fasciitis, left 03/22/2015  . Coronary artery disease due to lipid rich plaque 01/05/2015  . Chronic diastolic CHF (congestive heart failure), NYHA class 2 ([place]) 01/05/2015  . 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 Migraine (Ocular) 01/05/2014  . Gait difficulty 10/06/2013  . Pain in joint, shoulder region 11/27/2012  . VBI (vertebrobasilar insufficiency) 08/22/2012  . Right knee pain 07/29/2012  . TIA (transient ischemic attack) 06/27/2012  . Edema 02/01/2012  . DJD (degenerative joint disease) 02/02/2011  . 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 Postherpetic neuralgia ? 01/02/2010  . NECK PAIN 10/05/2009  . Palpitations 11/23/2008  . UTI'S, RECURRENT 09/28/2008  . Fibromyalgia 08/15/2007  . 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 Hyperlipidemia 05/21/2006  . Reactive airway disease 01/29/2002   Past Medical History:  Diagnosis Date  . Abnormal CT of the chest 2008   last CT4-l 2009:  . No f/u suggested   .  Allergy   . Asthma   . CAD (coronary artery disease)    a. Coronary CTA 10/16: Coronary Ca score 211, mod non-obstructive CAD with LM mild plaque (25-50%), mid LAD 50-69%. b. Neg nuc 06/2015.  . Cataract    BILATERAL-REMOVED  . Chronic diastolic CHF (congestive heart failure) ([place])   . Collagen vascular disease ([place])    "arterial sclerosis" per pt  . Complication of anesthesia    trouble waking up  . Fibromyalgia   . GERD  (gastroesophageal reflux disease)   . H/O hiatal hernia   . Heart murmur   . Hyperlipidemia   . Hypertension   . Malignant neoplasm of ascending colon ([place]) 2016   Minimally invasive right hemicolectomy to be done   . Neuromuscular disorder (HCC)    FIBROMYALGIA  . Ocular migraine   . OSA (obstructive sleep apnea) 09/2007   dx w/ a sleep study, not on  CPAP  . Osteoarthritis   . Osteoporosis   . Pneumonia    "double" in 2004  . PONV (postoperative nausea and vomiting)   . Reactive airway disease 01/29/2002   dx of pseudoasthma / vcd in 2005 and nl sprirometry History of dyspnea, 2011,  improved after several medications were changed around Question of COPD, disproved July 06, 2009 with nl pft's      . Rheumatoid factor positive   . Shingles 11/2009  . Sleep apnea   . Stroke ([place])   . TIA (transient ischemic attack)    x2 - on Plavix for this  . Torn rotator cuff    right worse than left, both are torn  . Tumor, 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  . Type II diabetes mellitus ([place])   . 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 Vaginal dysplasia     Family History  Problem Relation Age of Onset  . Heart disease Father   . Heart disease Mother   . Lung cancer Mother   . Allergies Sister   . Parkinsonism Sister        possible  . Asthma Sister   . Asthma Paternal Grandmother   . Stroke Paternal Grandmother   . Heart disease Other        paternal grandparents, maternal grandparents,   . Heart disease Brother   . Emphysema Brother   . Aneurysm Brother        x3  . Kidney failure Brother   . Diabetes Brother   . Diabetes Brother   . Stroke Brother   . Stroke Maternal Grandmother   . Breast cancer Neg Hx   . Colon cancer Neg Hx   . Heart attack Neg Hx     Past Surgical History:  Procedure Laterality Date  . ABDOMINAL HYSTERECTOMY  1980   NO oophorectomy per pt   . ANTERIOR CERVICAL DECOMP/DISCECTOMY FUSION  2001   C 3, C4 and C5 plate and screws  . BREAST BIOPSY Right  1999  . BUNIONECTOMY Left ~ 1977  . CARDIAC CATHETERIZATION     2018 By Dr. Pernell Dupre (done after colon surgery)  . CATARACT EXTRACTION W/ INTRAOCULAR LENS  IMPLANT, BILATERAL  2012  . COLON SURGERY  10/2014  . EYE SURGERY Bilateral    torq lens for cataracts  . LEFT HEART CATH AND CORONARY ANGIOGRAPHY N/A 03/22/2016   Procedure: Left Heart Cath and Coronary Angiography;  Surgeon: Belva Crome, MD;  Location: [facility];  Service: Cardiovascular;  Laterality:  N/A;  . LUMBAR LAMINECTOMY/DECOMPRESSION MICRODISCECTOMY N/A 01/20/2018   Procedure: L4-5 decompression;  Surgeon: Marybelle Killings, MD;  Location: [facility];  Service: Orthopedics;  Laterality: N/A;  . THYROIDECTOMY, PARTIAL  1960's  . VAGINAL MASS EXCISION  1994   "Laser surgery for vaginal cancer; followed by chemotherapy" (06/27/2012)   Social History   Occupational History  . Occupation: Retired, disable since 2000    Employer: RETIRED  Tobacco Use  . Smoking status: Former Smoker    Packs/day: 0.25    Years: 5.00    Pack years: 1.25    Types: Cigarettes    Last attempt to quit: 01/29/1998    Years since quitting: 20.0  . Smokeless tobacco: Never Used  . Tobacco comment: Quit in 2001  Substance and Sexual Activity  . Alcohol use: No    Alcohol/week: 0.0 standard drinks  . Drug use: No  . Sexual activity: Never

## 2018-01-30 ENCOUNTER — Other Ambulatory Visit: Payer: Self-pay | Admitting: Internal Medicine

## 2018-01-30 DIAGNOSIS — I5032 Chronic diastolic (congestive) heart failure: Secondary | ICD-10-CM

## 2018-01-30 DIAGNOSIS — R42 Dizziness and giddiness: Secondary | ICD-10-CM

## 2018-01-30 DIAGNOSIS — I2510 Atherosclerotic heart disease of native coronary artery without angina pectoris: Secondary | ICD-10-CM

## 2018-01-30 DIAGNOSIS — R5383 Other fatigue: Secondary | ICD-10-CM

## 2018-01-30 DIAGNOSIS — R0602 Shortness of breath: Secondary | ICD-10-CM

## 2018-01-30 DIAGNOSIS — I10 Essential (primary) hypertension: Secondary | ICD-10-CM

## 2018-01-30 NOTE — Telephone Encounter (Signed)
Last refill was 12/12/17 Last OV was 11/26/17 for chronic issues Next OV is 04/24/18

## 2018-02-03 NOTE — Discharge Summary (Signed)
Patient ID: Samantha [name] MRN: [id number] DOB/AGE: 1940-11-05 78 y.o.  Admit date: 01/20/2018 Discharge date: 02/03/2018  Admission Diagnoses:  Active Problems:   Lumbar stenosis   Discharge Diagnoses:  Active Problems:   Lumbar stenosis  status post Procedure(s): L4-5 decompression  Past Medical History:  Diagnosis Date  . Abnormal CT of the chest 2008   last CT4-l 2009:  . No f/u suggested   . Allergy   . Asthma   . CAD (coronary artery disease)    a. Coronary CTA 10/16: Coronary Ca score 211, mod non-obstructive CAD with LM mild plaque (25-50%), mid LAD 50-69%. b. Neg nuc 06/2015.  . Cataract    BILATERAL-REMOVED  . Chronic diastolic CHF (congestive heart failure) ([place])   . Collagen vascular disease ([place])    "arterial sclerosis" per pt  . Complication of anesthesia    trouble waking up  . Fibromyalgia   . GERD (gastroesophageal reflux disease)   . H/O hiatal hernia   . Heart murmur   . Hyperlipidemia   . Hypertension   . Malignant neoplasm of ascending colon ([place]) 2016   Minimally invasive right hemicolectomy to be done   . Neuromuscular disorder (HCC)    FIBROMYALGIA  . Ocular migraine   . OSA (obstructive sleep apnea) 09/2007   dx w/ a sleep study, not on  CPAP  . Osteoarthritis   . Osteoporosis   . Pneumonia    "double" in 2004  . PONV (postoperative nausea and vomiting)   . Reactive airway disease 01/29/2002   dx of pseudoasthma / vcd in 2005 and nl sprirometry History of dyspnea, 2011,  improved after several medications were changed around Question of COPD, disproved July 06, 2009 with nl pft's      . Rheumatoid factor positive   . Shingles 11/2009  . Sleep apnea   . Stroke ([place])   . TIA (transient ischemic attack)    x2 - on Plavix for this  . Torn rotator cuff    right worse than left, both are torn  . Tumor, 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  . Type II diabetes mellitus ([place])   . 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 Vaginal dysplasia      Surgeries: Procedure(s): L4-5 decompression on 01/20/2018   Consultants:   Discharged Condition: Improved  Hospital Course: Samantha [name] is an 78 y.o. female who was admitted 01/20/2018 for operative treatment of lumbar stenosis. Patient failed conservative treatments (please see the history and physical for the specifics) and had severe unremitting pain that affects sleep, daily activities and work/hobbies. After pre-op clearance, the patient was taken to the operating room on 01/20/2018 and underwent  Procedure(s): L4-5 decompression.    Patient was given perioperative antibiotics:  Anti-infectives (From admission, onward)   Start     Dose/Rate Route Frequency Ordered Stop   01/20/18 2300  vancomycin (VANCOCIN) IVPB 750 mg/150 ml premix     750 mg 150 mL/hr over 60 Minutes Intravenous  Once 01/20/18 1614 01/21/18 0041   01/20/18 1045  vancomycin (VANCOCIN) IVPB 1000 mg/200 mL premix     1,000 mg 200 mL/hr over 60 Minutes Intravenous On call to O.R. 01/20/18 1034 01/20/18 1228       Patient was given sequential compression devices and early ambulation to prevent DVT.   Patient benefited maximally from hospital stay and there were no complications. At the time of discharge, the patient was urinating/moving their bowels without difficulty, tolerating  a regular diet, pain is controlled with oral pain medications and they have been cleared by PT/OT.   Recent vital signs: No data found.   Recent laboratory studies: No results for input(s): WBC, HGB, HCT, PLT, NA, K, CL, CO2, BUN, CREATININE, GLUCOSE, INR, CALCIUM in the last 72 hours.  Invalid input(s): PT, 2   Discharge Medications:   Allergies as of 01/21/2018      Reactions   Bactrim [sulfamethoxazole-trimethoprim] Other (See Comments)   See OV 09-15-13, rash-tongue swelling due to bactrim ?   Cefuroxime Axetil Hives   Oxycodone Nausea And Vomiting   Pravastatin Other (See Comments)   "muscle breakdown" with profuse  sweating   Seldane [terfenadine] Hives   Zocor [simvastatin] Other (See Comments)   2012 "Muscle breakdown " with profuse sweating   Tramadol Nausea And Vomiting   Lime Flavor [flavoring Agent] Diarrhea   Tape Other (See Comments)   blisters      Medication List    STOP taking these medications   acetaminophen 500 MG tablet Commonly known as:  TYLENOL   ciprofloxacin 500 MG tablet Commonly known as:  CIPRO   multivitamin capsule   nitroGLYCERIN 0.4 MG SL tablet Commonly known as:  NITROSTAT   ranitidine 300 MG tablet Commonly known as:  ZANTAC     TAKE these medications   albuterol 108 (90 Base) MCG/ACT inhaler Commonly known as:  PROVENTIL HFA;VENTOLIN HFA Inhale 2 puffs into the lungs every 6 (six) hours as needed for wheezing or shortness of breath.   atorvastatin 20 MG tablet Commonly known as:  LIPITOR TAKE 1 TABLET BY MOUTH EVERY DAY   budesonide-formoterol 80-4.5 MCG/ACT inhaler Commonly known as:  SYMBICORT Inhale 2 puffs into the lungs 2 (two) times daily. What changed:    when to take this  reasons to take this   butalbital-acetaminophen-caffeine 50-325-40 MG tablet Commonly known as:  FIORICET Take 1 tablet by mouth every 6 (six) hours as needed for headache or migraine.   CALCIUM PO Take 1 tablet by mouth every morning.   cetirizine 10 MG tablet Commonly known as:  ZYRTEC TAKE 1 TABLET(10 MG) BY MOUTH DAILY   cholecalciferol 1000 units tablet Commonly known as:  VITAMIN D Take 1,000 Units by mouth every morning.   clopidogrel 75 MG tablet Commonly known as:  PLAVIX TAKE 1 TABLET(75 MG) BY MOUTH DAILY What changed:  See the new instructions.   hydrochlorothiazide 25 MG tablet Commonly known as:  HYDRODIURIL Take 0.5 tablets (12.5 mg total) by mouth daily.   hydrocortisone 2.5 % cream Apply topically 2 (two) times daily. What changed:    how much to take  when to take this  reasons to take this   isosorbide mononitrate 60 MG 24  hr tablet Commonly known as:  IMDUR Take 1 tablet (60 mg total) by mouth daily.   lisinopril 40 MG tablet Commonly known as:  PRINIVIL,ZESTRIL Take 1 tablet (40 mg total) by mouth daily.   metFORMIN 500 MG 24 hr tablet Commonly known as:  GLUCOPHAGE-XR TAKE 1 TABLET(500 MG) BY MOUTH DAILY WITH BREAKFAST What changed:  See the new instructions.   nebivolol 10 MG tablet Commonly known as:  BYSTOLIC Take 1 tablet (10 mg total) by mouth daily.   Potassium 99 MG Tabs Take 99 mg by mouth daily.   promethazine 25 MG tablet Commonly known as:  PHENERGAN Take 1 tablet (25 mg total) by mouth every 8 (eight) hours as needed for nausea or vomiting.  SYSTANE 0.4-0.3 % Gel ophthalmic gel Generic drug:  Polyethyl Glycol-Propyl Glycol Place 1 application into both eyes daily as needed (for dry eyes).       Diagnostic Studies: Dg Chest 2 View  Result Date: 01/16/2018 CLINICAL DATA:  Pre back surgery evaluation. EXAM: CHEST - 2 VIEW COMPARISON:  Chest CTA dated 07/26/2015 and chest radiographs dated 10/20/2014. FINDINGS: The cardiac silhouette is borderline enlarged. The aorta remains tortuous. Clear lungs with normal vascularity. Thoracic, lower cervical and upper lumbar spine degenerative changes. Cervical spine fixation hardware. IMPRESSION: No acute abnormality. Electronically Signed   By: Claudie Revering M.D.   On: 01/16/2018 15:43   Dg Lumbar Spine 2-3 Views  Result Date: 01/20/2018 CLINICAL DATA:  L4-5 lumbar decompression. EXAM: LUMBAR SPINE - 2-3 VIEW COMPARISON:  MRI 12/04/2017 FINDINGS: On the first image a surgical probe is identified overlying the L3 spinous process. A second surgical probe is noted posterior to the L4-5 disc space. On the second image there is a surgical probe overlying the L4 spinous process in a second probe posterior to the L5-S1 disc space. On the third image a surgical probe overlies the L3 spinous process and a second probe is posterior to the L5-S1 disc space.  IMPRESSION: 1. Surgical probe localization of the lumbar spine levels as described above. Electronically Signed   By: Kerby Moors M.D.   On: 01/20/2018 14:30      Follow-up Information    Schedule an appointment as soon as possible for a visit with Marybelle Killings, MD.   Specialty:  Orthopedic Surgery Why:  need return office visit one week posotp Contact information: [place] [address] [phone]           Discharge Plan:  discharge to home  Disposition:     Signed: Benjiman Core  02/03/2018, 3:46 PM

## 2018-02-15 ENCOUNTER — Other Ambulatory Visit: Payer: Self-pay | Admitting: Internal Medicine

## 2018-02-19 ENCOUNTER — Telehealth: Payer: Self-pay | Admitting: *Deleted

## 2018-02-19 NOTE — Telephone Encounter (Signed)
AMB REFERRAL TO UROLOGY  Received: Today  Message Contents  Conley Simmonds, LPN        Patient saw Dr Lovena Neighbours on 01-03-18

## 2018-02-25 ENCOUNTER — Ambulatory Visit (INDEPENDENT_AMBULATORY_CARE_PROVIDER_SITE_OTHER): Payer: Medicare Other | Admitting: Orthopaedic Surgery

## 2018-02-25 ENCOUNTER — Ambulatory Visit (INDEPENDENT_AMBULATORY_CARE_PROVIDER_SITE_OTHER): Payer: Medicare Other

## 2018-02-25 ENCOUNTER — Encounter (INDEPENDENT_AMBULATORY_CARE_PROVIDER_SITE_OTHER): Payer: Self-pay | Admitting: Orthopaedic Surgery

## 2018-02-25 VITALS — BP 120/86 | HR 71 | Ht 62.0 in | Wt 182.0 lb

## 2018-02-25 DIAGNOSIS — M1711 Unilateral primary osteoarthritis, right knee: Secondary | ICD-10-CM | POA: Diagnosis not present

## 2018-02-25 DIAGNOSIS — Z9889 Other specified postprocedural states: Secondary | ICD-10-CM

## 2018-02-25 NOTE — Progress Notes (Signed)
Post-Op Visit Note   Patient: Samantha [name]           Date of Birth: August 07, 1940           MRN: [id number] Visit Date: 02/25/2018 PCP: [name], MD   Assessment & Plan: Post L4-5 decompression with improvement in her symptoms.  She has severe right knee osteoarthritis with repetitive catching of her knee off and on with pain in the lateral compartment.  We reviewed the x-rays and discussed total knee arthroplasty she wants to come back in couple months to discuss this further.  Ice Tylenol, Aspercreme discussed she is already using a cane.  Chief Complaint:  Chief Complaint  Patient presents with  . Lower Back - Follow-up    01/20/18 L4-5 Decompression  . Right Knee - Pain   Visit Diagnoses:  1. Unilateral primary osteoarthritis, right knee   2. 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     Plan: Continue walking.  She will use her cane.  She can return in 2 months we can discuss right total knee arthroplasty at that time.  Follow-Up Instructions: Return in about 2 months (around 04/26/2018).   Orders:  Orders Placed This Encounter  Procedures  . XR KNEE 3 VIEW RIGHT   No orders of the defined types were placed in this encounter.   Imaging: Xr Knee 3 View Right  Result Date: 02/25/2018 Standing AP both knees lateral right knee and bilateral sunrise patellar x-rays are obtained and reviewed.  This shows severe osteoarthritis right knee with moderate on the left knee.  There is marginal osteophyte subchondral sclerosis loss of joint space bone-on-bone lateral compartment right knee and patellofemoral degenerative spurring.  Negative for acute fracture. Impression: Severe right knee osteoarthritis moderate left knee osteoarthritis.   PMFS History: Patient Active Problem List   Diagnosis Date Noted  . Unilateral primary osteoarthritis, right knee 01/28/2018  . Status post lumbar spine surgery for decompression of spinal cord  01/28/2018  . Lumbar stenosis 01/20/2018  . Viral gastroenteritis 01/15/2018  . Spinal stenosis of lumbar region 12/11/2017  . Spinal stenosis of lumbosacral region 11/26/2017  . Acute left-sided low back pain without sciatica 11/10/2017  . GERD (gastroesophageal reflux disease) 10/23/2017  . Candidiasis of skin 10/16/2017  . Sacral back pain 10/16/2017  . Epistaxis 07/23/2017  . Headache 07/23/2017  . Chronic left upper quadrant pain 07/23/2017  . Rectal bleeding 07/23/2017  . Poor balance 03/14/2017  . Sprain of right ankle 03/14/2017  . Right shoulder pain 03/14/2017  . Hair loss 10/19/2016  . Difficulty urinating 04/18/2016  . Angina pectoris ([place]) 03/22/2016  . Spondylosis of lumbar region without myelopathy or radiculopathy 02/02/2016  . CAD in native artery 08/22/2015  . Hypertensive heart disease 07/07/2015  . Essential hypertension 07/07/2015  . SOB (shortness of breath) 07/07/2015  . Dizziness 07/07/2015  . Plantar fasciitis, left 03/22/2015  . Coronary artery disease due to lipid rich plaque 01/05/2015  . Chronic diastolic CHF (congestive heart failure), NYHA class 2 ([place]) 01/05/2015  . 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 Migraine ([place]) 01/05/2014  . Gait difficulty 10/06/2013  . Pain in joint, shoulder region 11/27/2012  . VBI (vertebrobasilar insufficiency) 08/22/2012  . Right knee pain 07/29/2012  . TIA (transient ischemic attack) 06/27/2012  . Edema 02/01/2012  . DJD (degenerative joint disease) 02/02/2011  . 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 Postherpetic neuralgia ?  01/02/2010  . NECK PAIN 10/05/2009  . Palpitations 11/23/2008  . UTI'S, RECURRENT 09/28/2008  . Fibromyalgia 08/15/2007  . 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 Hyperlipidemia 05/21/2006  . Reactive airway disease 01/29/2002   Past Medical History:  Diagnosis Date  . Abnormal CT of the chest 2008    last CT4-l 2009:  . No f/u suggested   . Allergy   . Asthma   . CAD (coronary artery disease)    a. Coronary CTA 10/16: Coronary Ca score 211, mod non-obstructive CAD with LM mild plaque (25-50%), mid LAD 50-69%. b. Neg nuc 06/2015.  . Cataract    BILATERAL-REMOVED  . Chronic diastolic CHF (congestive heart failure) ([place])   . Collagen vascular disease ([place])    "arterial sclerosis" per pt  . Complication of anesthesia    trouble waking up  . Fibromyalgia   . GERD (gastroesophageal reflux disease)   . H/O hiatal hernia   . Heart murmur   . Hyperlipidemia   . Hypertension   . Malignant neoplasm of ascending colon ([place]) 2016   Minimally invasive right hemicolectomy to be done   . Neuromuscular disorder ([facility])    FIBROMYALGIA  . Ocular migraine   . OSA (obstructive sleep apnea) 09/2007   dx w/ a sleep study, not on  CPAP  . Osteoarthritis   . Osteoporosis   . Pneumonia    "double" in 2004  . PONV (postoperative nausea and vomiting)   . Reactive airway disease 01/29/2002   dx of pseudoasthma / vcd in 2005 and nl sprirometry History of dyspnea, 2011,  improved after several medications were changed around Question of COPD, disproved July 06, 2009 with nl pft's      . Rheumatoid factor positive   . Shingles 11/2009  . Sleep apnea   . Stroke ([place])   . TIA (transient ischemic attack)    x2 - on Plavix for this  . Torn rotator cuff    right worse than left, both are torn  . Tumor, 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  . Type II diabetes mellitus ([place])   . 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 Vaginal dysplasia     Family History  Problem Relation Age of Onset  . Heart disease Father   . Heart disease Mother   . Lung cancer Mother   . Allergies Sister   . Parkinsonism Sister        possible  . Asthma Sister   . Asthma Paternal Grandmother   . Stroke Paternal Grandmother   . Heart disease Other        paternal grandparents, maternal grandparents,   . Heart disease Brother     . Emphysema Brother   . Aneurysm Brother        x3  . Kidney failure Brother   . Diabetes Brother   . Diabetes Brother   . Stroke Brother   . Stroke Maternal Grandmother   . Breast cancer Neg Hx   . Colon cancer Neg Hx   . Heart attack Neg Hx     Past Surgical History:  Procedure Laterality Date  . ABDOMINAL HYSTERECTOMY  1980   NO oophorectomy per pt   . ANTERIOR CERVICAL DECOMP/DISCECTOMY FUSION  2001   C 3, C4 and C5 plate and screws  . BREAST BIOPSY Right 1999  . BUNIONECTOMY Left ~ 1977  . CARDIAC CATHETERIZATION     2018 By Dr. Pernell Dupre (  done after colon surgery)  . CATARACT EXTRACTION W/ INTRAOCULAR LENS  IMPLANT, BILATERAL  2012  . COLON SURGERY  10/2014  . EYE SURGERY Bilateral    torq lens for cataracts  . LEFT HEART CATH AND CORONARY ANGIOGRAPHY N/A 03/22/2016   Procedure: Left Heart Cath and Coronary Angiography;  Surgeon: Belva Crome, MD;  Location: [facility];  Service: Cardiovascular;  Laterality: N/A;  . LUMBAR LAMINECTOMY/DECOMPRESSION MICRODISCECTOMY N/A 01/20/2018   Procedure: L4-5 decompression;  Surgeon: Marybelle Killings, MD;  Location: [facility];  Service: Orthopedics;  Laterality: N/A;  . THYROIDECTOMY, PARTIAL  1960's  . VAGINAL MASS EXCISION  1994   "Laser surgery for vaginal cancer; followed by chemotherapy" (06/27/2012)   Social History   Occupational History  . Occupation: Retired, disable since 2000    Employer: RETIRED  Tobacco Use  . Smoking status: Former Smoker    Packs/day: 0.25    Years: 5.00    Pack years: 1.25    Types: Cigarettes    Last attempt to quit: 01/29/1998    Years since quitting: 20.0  . Smokeless tobacco: Never Used  . Tobacco comment: Quit in 2001  Substance and Sexual Activity  . Alcohol use: No    Alcohol/week: 0.0 standard drinks  . Drug use: No  . Sexual activity: Never

## 2018-03-01 ENCOUNTER — Other Ambulatory Visit: Payer: Self-pay | Admitting: Internal Medicine

## 2018-03-01 DIAGNOSIS — I5032 Chronic diastolic (congestive) heart failure: Secondary | ICD-10-CM

## 2018-03-01 DIAGNOSIS — R0602 Shortness of breath: Secondary | ICD-10-CM

## 2018-03-01 DIAGNOSIS — R5383 Other fatigue: Secondary | ICD-10-CM

## 2018-03-01 DIAGNOSIS — I10 Essential (primary) hypertension: Secondary | ICD-10-CM

## 2018-03-01 DIAGNOSIS — I2510 Atherosclerotic heart disease of native coronary artery without angina pectoris: Secondary | ICD-10-CM

## 2018-03-01 DIAGNOSIS — R42 Dizziness and giddiness: Secondary | ICD-10-CM

## 2018-03-03 NOTE — Telephone Encounter (Signed)
Last refill was 01/31/18 Last routine OV was 10/23/17 Next OV 04/24/18

## 2018-03-28 ENCOUNTER — Other Ambulatory Visit: Payer: Self-pay | Admitting: Internal Medicine

## 2018-03-28 ENCOUNTER — Other Ambulatory Visit: Payer: Self-pay | Admitting: Cardiology

## 2018-04-10 ENCOUNTER — Ambulatory Visit (INDEPENDENT_AMBULATORY_CARE_PROVIDER_SITE_OTHER): Payer: Medicare Other | Admitting: Cardiology

## 2018-04-10 ENCOUNTER — Encounter: Payer: Self-pay | Admitting: Cardiology

## 2018-04-10 ENCOUNTER — Other Ambulatory Visit: Payer: Self-pay

## 2018-04-10 VITALS — BP 126/72 | HR 61 | Ht 62.0 in | Wt 180.0 lb

## 2018-04-10 DIAGNOSIS — E782 Mixed hyperlipidemia: Secondary | ICD-10-CM

## 2018-04-10 DIAGNOSIS — I2510 Atherosclerotic heart disease of native coronary artery without angina pectoris: Secondary | ICD-10-CM

## 2018-04-10 DIAGNOSIS — I119 Hypertensive heart disease without heart failure: Secondary | ICD-10-CM | POA: Diagnosis not present

## 2018-04-10 DIAGNOSIS — Q613 Polycystic kidney, unspecified: Secondary | ICD-10-CM | POA: Diagnosis not present

## 2018-04-10 NOTE — Patient Instructions (Signed)
Medication Instructions:   Your physician recommends that you continue on your current medications as directed. Please refer to the Current Medication list given to you today.  If you need a refill on your cardiac medications before your next appointment, please call your pharmacy.     Lab work:  PRIOR TO YOUR 6 MONTH FOLLOW-UP APPOINTMENT WITH DR NELSON--PLEASE HAVE CHECKOUT SCHEDULE THIS TODAY--WE WILL CHECK A CMET, CBC W DIFF, TSH, AND LIPIDS--PLEASE COME FASTING TO THIS LAB APPOINTMENT  If you have labs (blood work) drawn today and your tests are completely normal, you will receive your results only by: [name] MyChart Message (if you have MyChart) OR . A paper copy in the mail If you have any lab test that is abnormal or we need to change your treatment, we will call you to review the results.    Follow-Up:  Your physician wants you to follow-up in: [place] will receive a reminder letter in the mail two months in advance. If you don't receive a letter, please call our office to schedule the follow-up appointment.  PLEASE HAVE CHECKOUT SCHEDULE YOUR LAB APPOINTMENT TODAY FOR 6 MONTHS-PRIOR TO THIS APPOINTMENT

## 2018-04-12 NOTE — Progress Notes (Signed)
Cardiology Office Note    Date:  04/12/2018   ID:  Samantha [name], Samantha [name] 1940/07/05, MRN [id number]  PCP:  [name], MD  Cardiologist:  [name]  CC: Hypertension, preoperative evaluation  History of Present Illness:  Samantha [name] is a 78 y.o. female with a history of CAD (moderate nonobstructive disease by cardiac CT 10/2014), TIA (on Plavix), chronic diastolic CHF, DM2, HTN, HL, fibromyalgia, palpitations secondary to symptomatic PVCs, LE edema, colon cancer (s/p lap partial colectomy 10/2014), reactive airway disease who presents to clinic for evaluation of chest pressure. Cardiac catheterization performed in February 2018 showed 40% mid RCA stenosis and 50% mid LAD stenosis. The patient continued to have intermittent exertional and resting chest pain that has resolved after she was started on Imdur 30 mg daily.  12/16/2017 -this is 3 months follow-up, has been dealing with severe back pain secondary to spinal stenosis that is also associated with frequent falls and injury to her right arm.  She is scheduled for surgery with Dr. Lorin Mercy at [facility].  She denies any chest pain shortness of breath, she states her blood pressure has been lately, she has not had any lower extremity edema orthopnea proximal nocturnal dyspnea.  No syncope.  04/10/18 - 3 months follow up, the patient underwent back surgery and has been feeling great, she is experiencing much less pain and is able to perform limited exercises. Denies chest pain, SOB, no LE edema, tolerates medications well. She is struggling to take care of her husband who has progressively worsening symptoms of MS and unable to perform any ADL.  Past Medical History:  Diagnosis Date  . Abnormal CT of the chest 2008   last CT4-l 2009:  . No f/u suggested   . Allergy   . Asthma   . CAD (coronary artery disease)    a. Coronary CTA 10/16: Coronary Ca score 211, mod non-obstructive CAD with LM mild plaque (25-50%), mid LAD  50-69%. b. Neg nuc 06/2015.  . Cataract    BILATERAL-REMOVED  . Chronic diastolic CHF (congestive heart failure) ([place])   . Collagen vascular disease ([place])    "arterial sclerosis" per pt  . Complication of anesthesia    trouble waking up  . Fibromyalgia   . GERD (gastroesophageal reflux disease)   . H/O hiatal hernia   . Heart murmur   . Hyperlipidemia   . Hypertension   . Malignant neoplasm of ascending colon ([place]) 2016   Minimally invasive right hemicolectomy to be done   . Neuromuscular disorder (HCC)    FIBROMYALGIA  . Ocular migraine   . OSA (obstructive sleep apnea) 09/2007   dx w/ a sleep study, not on  CPAP  . Osteoarthritis   . Osteoporosis   . Pneumonia    "double" in 2004  . PONV (postoperative nausea and vomiting)   . Reactive airway disease 01/29/2002   dx of pseudoasthma / vcd in 2005 and nl sprirometry History of dyspnea, 2011,  improved after several medications were changed around Question of COPD, disproved July 06, 2009 with nl pft's      . Rheumatoid factor positive   . Shingles 11/2009  . Sleep apnea   . Stroke ([place])   . TIA (transient ischemic attack)    x2 - on Plavix for this  . Torn rotator cuff    right worse than left, both are torn  . Tumor, 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  . Type  II diabetes mellitus ([place])   . 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 Vaginal dysplasia     Past Surgical History:  Procedure Laterality Date  . ABDOMINAL HYSTERECTOMY  1980   NO oophorectomy per pt   . ANTERIOR CERVICAL DECOMP/DISCECTOMY FUSION  2001   C 3, C4 and C5 plate and screws  . BREAST BIOPSY Right 1999  . BUNIONECTOMY Left ~ 1977  . CARDIAC CATHETERIZATION     2018 By Dr. Pernell Dupre (done after colon surgery)  . CATARACT EXTRACTION W/ INTRAOCULAR LENS  IMPLANT, BILATERAL  2012  . COLON SURGERY  10/2014  . EYE SURGERY Bilateral    torq lens for cataracts  . LEFT HEART CATH AND CORONARY ANGIOGRAPHY N/A 03/22/2016   Procedure: Left Heart Cath and  Coronary Angiography;  Surgeon: Belva Crome, MD;  Location: [facility];  Service: Cardiovascular;  Laterality: N/A;  . LUMBAR LAMINECTOMY/DECOMPRESSION MICRODISCECTOMY N/A 01/20/2018   Procedure: L4-5 decompression;  Surgeon: Marybelle Killings, MD;  Location: [facility];  Service: Orthopedics;  Laterality: N/A;  . THYROIDECTOMY, PARTIAL  1960's  . VAGINAL MASS EXCISION  1994   "Laser surgery for vaginal cancer; followed by chemotherapy" (06/27/2012)    Current Medications: Outpatient Medications Prior to Visit  Medication Sig Dispense Refill  . albuterol (PROVENTIL HFA;VENTOLIN HFA) 108 (90 Base) MCG/ACT inhaler Inhale 2 puffs into the lungs every 6 (six) hours as needed for wheezing or shortness of breath. 18 g 3  . atorvastatin (LIPITOR) 20 MG tablet TAKE 1 TABLET BY MOUTH EVERY DAY (Patient taking differently: Take 20 mg by mouth daily. ) 90 tablet 2  . budesonide-formoterol (SYMBICORT) 80-4.5 MCG/ACT inhaler Inhale 2 puffs into the lungs 2 (two) times daily. (Patient taking differently: Inhale 2 puffs into the lungs 2 (two) times daily as needed (for shortness of breath or wheezing). ) 1 Inhaler 3  . butalbital-acetaminophen-caffeine (FIORICET) 50-325-40 MG tablet Take 1 tablet by mouth every 6 (six) hours as needed for headache or migraine. 30 tablet 5  . BYSTOLIC 10 MG tablet TAKE 1 TABLET(10 MG) BY MOUTH DAILY 90 tablet 1  . CALCIUM PO Take 1 tablet by mouth every morning.     . cetirizine (ZYRTEC) 10 MG tablet TAKE 1 TABLET(10 MG) BY MOUTH DAILY 90 tablet 0  . cholecalciferol (VITAMIN D) 1000 UNITS tablet Take 1,000 Units by mouth every morning.     . clopidogrel (PLAVIX) 75 MG tablet TAKE 1 TABLET(75 MG) BY MOUTH DAILY 90 tablet 1  . hydrochlorothiazide (HYDRODIURIL) 25 MG tablet Take 0.5 tablets (12.5 mg total) by mouth daily. 90 tablet 1  . HYDROcodone-acetaminophen (NORCO/VICODIN) 5-325 MG tablet Take 1 tablet by mouth every 6 (six) hours as needed for moderate pain. 30 tablet 0  .  hydrocortisone 2.5 % cream Apply topically 2 (two) times daily. (Patient taking differently: Apply 1 application topically 2 (two) times daily as needed (for rash). ) 30 g 0  . isosorbide mononitrate (IMDUR) 60 MG 24 hr tablet Take 1 tablet (60 mg total) by mouth daily. 90 tablet 3  . lisinopril (PRINIVIL,ZESTRIL) 40 MG tablet Take 1 tablet (40 mg total) by mouth daily. 90 tablet 1  . metFORMIN (GLUCOPHAGE-XR) 500 MG 24 hr tablet TAKE 1 TABLET(500 MG) BY MOUTH DAILY WITH BREAKFAST 90 tablet 0  . Polyethyl Glycol-Propyl Glycol (SYSTANE) 0.4-0.3 % GEL Place 1 application into both eyes daily as needed (for dry eyes).     . Potassium 99 MG TABS Take 99 mg by  mouth daily.     . pregabalin (LYRICA) 100 MG capsule TAKE 1 CAPSULE(100 MG) BY MOUTH DAILY AS NEEDED FOR PAIN 30 capsule 0  . promethazine (PHENERGAN) 25 MG tablet Take 1 tablet (25 mg total) by mouth every 8 (eight) hours as needed for nausea or vomiting. 20 tablet 0   No facility-administered medications prior to visit.      Allergies:   Bactrim [sulfamethoxazole-trimethoprim]; Cefuroxime axetil; Oxycodone; Pravastatin; Seldane [terfenadine]; Zocor [simvastatin]; Tramadol; Lime flavor [flavoring agent]; and Tape   Social History   Socioeconomic History  . Marital status: Married    Spouse name: Ilona Sorrel  . Number of children: 2  . Years of education: masters  . Highest education level: Not on file  Occupational History  . Occupation: Retired, disable since 2000    Employer: RETIRED  Social Needs  . Financial resource strain: Not hard at all  . Food insecurity:    Worry: Never true    Inability: Never true  . Transportation needs:    Medical: No    Non-medical: No  Tobacco Use  . Smoking status: Former Smoker    Packs/day: 0.25    Years: 5.00    Pack years: 1.25    Types: Cigarettes    Last attempt to quit: 01/29/1998    Years since quitting: 20.2  . Smokeless tobacco: Never Used  . Tobacco comment: Quit in 2001  Substance  and Sexual Activity  . Alcohol use: No    Alcohol/week: 0.0 standard drinks  . Drug use: No  . Sexual activity: Never  Lifestyle  . Physical activity:    Days per week: 0 days    Minutes per session: 0 min  . Stress: Very much  Relationships  . Social connections:    Talks on phone: More than three times a week    Gets together: More than three times a week    Attends religious service: More than 4 times per year    Active member of club or organization: Yes    Attends meetings of clubs or organizations: More than 4 times per year    Relationship status: Married  Other Topics Concern  . Not on file  Social History Narrative   On disability since 2000--- also husband has MS   Education. College.   Right handed.     Family History:  The patient's family history includes Allergies in her sister; Aneurysm in her brother; Asthma in her paternal grandmother and sister; Diabetes in her brother and brother; Emphysema in her brother; Heart disease in her brother, father, mother, and another family member; Kidney failure in her brother; Lung cancer in her mother; Parkinsonism in her sister; Stroke in her brother, maternal grandmother, and paternal grandmother.     ROS:   Please see the history of present illness.    ROS All other systems reviewed and are negative.   PHYSICAL EXAM:   VS:  BP 126/72   Pulse 61   Ht 5\' 2"  (1.575 m)   Wt 180 lb (81.6 kg)   BMI 32.92 kg/m    GEN: Well nourished, well developed, in no acute distress, obese HEENT: normal  Neck: no JVD, carotid bruits, or masses Cardiac: RRR; no murmurs, rubs, or gallops, mild bilateral edema around the ankles. Respiratory:  clear to auscultation bilaterally, normal work of breathing GI: soft, nontender, nondistended, + BS MS: no deformity or atrophy  Skin: warm and dry, no rash Neuro:  Alert and Oriented x 3, Strength  and sensation are intact Psych: euthymic mood, full affect   Wt Readings from Last 3 Encounters:   04/10/18 180 lb (81.6 kg)  02/25/18 182 lb (82.6 kg)  01/28/18 191 lb (86.6 kg)    Studies/Labs Reviewed:   EKG:  EKG is ordered today.  It shows normal sinus rhythm with no nonspecific ST-T wave abnormalities in lateral leads but this was personally reviewed.  Recent Labs: 04/23/2017: TSH 2.52 01/16/2018: ALT 16; Hemoglobin 12.2; Platelets 152 01/21/2018: BUN 13; Creatinine, Ser 0.91; Potassium 3.8; Sodium 136   Lipid Panel    Component Value Date/Time   CHOL 140 10/23/2017 1050   TRIG 78.0 10/23/2017 1050   HDL 61.70 10/23/2017 1050   CHOLHDL 2 10/23/2017 1050   VLDL 15.6 10/23/2017 1050   LDLCALC 63 10/23/2017 1050   LDLDIRECT 126.2 12/07/2010 1039   Additional studies/ records that were reviewed today include:  Summarized above    ASSESSMENT & PLAN:   1.  Hypertension- controlled.  2. CAD: Left heart cath in 03/2016 showed 40% mid RCA and 50% mid LAD stenosis.  Continue plavix (on for TIA), statin and BB. She might have component of spasm as her symptoms have significantly improved with Imdur. Will continue.   3. Chronic diastolic CHF: Currently euvolemic, normal kidney function  4. HLD: continue statin.  5. Hx of TIA: continue plavix and statin, only if absolutely necessary if her epistaxis continues we will discontinue Plavix.  6.  Polycystic kidney disease -with normal kidney function, we will refer to nephrology.  Medication Adjustments/Labs and Tests Ordered: Current medicines are reviewed at length with the patient today.  Concerns regarding medicines are outlined above.  Medication changes, Labs and Tests ordered today are listed in the Patient Instructions below. Patient Instructions  Medication Instructions:   Your physician recommends that you continue on your current medications as directed. Please refer to the Current Medication list given to you today.  If you need a refill on your cardiac medications before your next appointment, please call your  pharmacy.     Lab work:  PRIOR TO YOUR 6 MONTH FOLLOW-UP APPOINTMENT WITH DR Efstathios Sawin--PLEASE HAVE CHECKOUT SCHEDULE THIS TODAY--WE WILL CHECK A CMET, CBC W DIFF, TSH, AND LIPIDS--PLEASE COME FASTING TO THIS LAB APPOINTMENT  If you have labs (blood work) drawn today and your tests are completely normal, you will receive your results only by: [name] MyChart Message (if you have MyChart) OR . A paper copy in the mail If you have any lab test that is abnormal or we need to change your treatment, we will call you to review the results.    Follow-Up:  Your physician wants you to follow-up in: [place] will receive a reminder letter in the mail two months in advance. If you don't receive a letter, please call our office to schedule the follow-up appointment.  PLEASE HAVE CHECKOUT SCHEDULE YOUR LAB APPOINTMENT TODAY FOR 6 MONTHS-PRIOR TO THIS APPOINTMENT     Signed, Ena Dawley, MD  04/12/2018 8:01 PM    [facility] [address], [place], [place]  [address] Phone: [phone]; Fax: [phone]

## 2018-04-13 ENCOUNTER — Other Ambulatory Visit: Payer: Self-pay | Admitting: Internal Medicine

## 2018-04-14 NOTE — Addendum Note (Signed)
Addended by: Marlis Edelson C on: 04/14/2018 01:36 PM   Modules accepted: Orders

## 2018-04-16 ENCOUNTER — Telehealth: Payer: Self-pay

## 2018-04-16 NOTE — Telephone Encounter (Signed)
Pt is calling to inform Dr Meda Coffee that last week when she saw her in clinic, she forgot to mention to her that her atorvastatin is causing her upper extremity and lower extremity joint aches and muscle pains.   Pt would like for Dr Meda Coffee to advise on an alternative regimen if possible.  Pt does have multiple statin intolerances noted in her allergies. Pt states she will see her PCP Dr Quay Burow next week for a wellness visit, and wanted to make Dr Meda Coffee aware of this.  Advised the pt to hold her atorvastatin, and I will route this message to Dr Meda Coffee to review and advise on, and follow-up with the pt thereafter.  Pt verbalized understanding and agrees with this plan.

## 2018-04-16 NOTE — Telephone Encounter (Signed)
Pt called in stating that the pharmacist told her to stop taking her Atorvastatin. She states that she is having the same side effects as the medications that she is allergic to. She would like to know if there are any alternatives. She would like a call back on [phone]. Please address. Thank you.

## 2018-04-17 ENCOUNTER — Other Ambulatory Visit: Payer: Self-pay | Admitting: Cardiology

## 2018-04-17 MED ORDER — ROSUVASTATIN CALCIUM 10 MG PO TABS: 10.0000 mg | ORAL_TABLET | Freq: Every day | ORAL | 0 refills | Status: DC

## 2018-04-17 NOTE — Telephone Encounter (Signed)
Notified the pt that per Dr Meda Coffee, she wants her to stop atorvastatin, and try her on rosuvastatin 10 mg po daily, and she should call us back in 2-3 weeks, to report if her symptoms have improved or not.  Informed the pt that if her symptoms have not improved and she fails this statin, Dr Meda Coffee will then refer her to our lipid clinic for PCSK9-inhibitors.  Confirmed the pharmacy of choice with the pt. Endorsed to the pt that I will only send in 30 pills of this med, to make sure she tolerates this appropriately or not.  Updated atorvastatin in her allergies as an intolerance. Pt verbalized understanding and agrees with this plan.

## 2018-04-17 NOTE — Telephone Encounter (Signed)
Lets try rosuvastatin 10 mg po daily, if she fails that - (and please encourage her to call us in 2-3 weeks), we will refer her to the lipid clinic for PCSK 9 inhibitors.

## 2018-04-21 ENCOUNTER — Telehealth (INDEPENDENT_AMBULATORY_CARE_PROVIDER_SITE_OTHER): Payer: Self-pay

## 2018-04-21 NOTE — Telephone Encounter (Signed)
Called patient. No answer LMOM to return our call. If they return call, please ask them screening questions below. Thank you.   Do you have now or have you had in the past 7 days a fever and/or chills?   Do you have now or have you had in the past 7 days a cough?   Do you have now or have you had in the last 7 days nausea, vomiting or abdominal pain?   Have you been exposed to anyone who has tested positive for COVID-19?   Have you or anyone who lives with you traveled within the last month? 

## 2018-04-22 ENCOUNTER — Telehealth (INDEPENDENT_AMBULATORY_CARE_PROVIDER_SITE_OTHER): Payer: Medicare Other | Admitting: Internal Medicine

## 2018-04-22 ENCOUNTER — Ambulatory Visit (INDEPENDENT_AMBULATORY_CARE_PROVIDER_SITE_OTHER): Payer: Medicare Other | Admitting: Orthopaedic Surgery

## 2018-04-22 ENCOUNTER — Encounter: Payer: Medicare Other | Admitting: Internal Medicine

## 2018-04-22 ENCOUNTER — Encounter (INDEPENDENT_AMBULATORY_CARE_PROVIDER_SITE_OTHER): Payer: Self-pay

## 2018-04-22 DIAGNOSIS — J069 Acute upper respiratory infection, unspecified: Secondary | ICD-10-CM | POA: Diagnosis not present

## 2018-04-22 NOTE — Telephone Encounter (Signed)
Yes, see if she is willing to try a virtual visit.

## 2018-04-22 NOTE — Telephone Encounter (Signed)
Patient is canceling her follow up appointment on 04/23/2018 because she has been sick for 4 days. Experiencing headaches, dry cough, sore throat, wheezing, and feeling short of breath. Using her inhaler. Experiencing no congestion. States to her that she had a fever, her temperature is 97.1 normally and it has been 98.3 and she states to her that is a fever even if it is not considered one per the guidelines. Patient would like a call back from the office to determine if she needs a electronic visit or not. Call back is [phone]

## 2018-04-22 NOTE — Telephone Encounter (Signed)
Spoke with pt and she was willing to try virtual. I told her the app to download. States she does not have access to her e-mail but she will try to get in. She may have to set up another e-mail account. Will call her back in a bit while she tries to work that out and see how far she has gotten.

## 2018-04-22 NOTE — Telephone Encounter (Signed)
Would you like to try a virtual visit with patient?

## 2018-04-22 NOTE — Progress Notes (Signed)
This encounter was created in error - please disregard.

## 2018-04-22 NOTE — Telephone Encounter (Signed)
She was unable to do the virtual video visit.  Cumulative time during 7-day interval 10 minutes, there was not an associated office visit for this concern within a 7 day period.  Verbal consent for services obtained from patient prior to services given.  Names of all persons present for services: [name], MD, Debbora Presto  Chief complaint: cold symptoms for approximately 4 days  She states a dry cough, mild wheeze, slightly more shortness of breath than usual, low-grade fevers up to 98 3 (normal temperature 97.9), headaches there is in her forehead and behind both ears, slight sore throat, chest pain and rib discomfort, leg pains, generalized fatigue/generalized weakness, her eyes feel achy and heavy in her head feels large.  She denies any ear pain, sinus pain, nausea, vomiting, diarrhea.  She has taken Tylenol in a couple of occasions.  That has seemed to help.  She has been drinking as much fluids as possible and getting rest, which also have helped.  She is using her Symbicort twice daily and her albuterol inhaler as needed.  She has had a couple of family members coming in and out with groceries and medications.  She is also had home health aide come in to help with her husband.  She just recently discontinued that for now because the last person that came into her house did not have a mask on and she started to feel ill shortly after that.  History, background, results pertinent:  Past Medical History:  Diagnosis Date  . Abnormal CT of the chest 2008   last CT4-l 2009:  . No f/u suggested   . Allergy   . Asthma   . CAD (coronary artery disease)    a. Coronary CTA 10/16: Coronary Ca score 211, mod non-obstructive CAD with LM mild plaque (25-50%), mid LAD 50-69%. b. Neg nuc 06/2015.  . Cataract    BILATERAL-REMOVED  . Chronic diastolic CHF (congestive heart failure) ([place])   . Collagen vascular disease ([place])    "arterial sclerosis" per pt  . Complication of anesthesia    trouble waking up  . Fibromyalgia   . GERD (gastroesophageal reflux disease)   . H/O hiatal hernia   . Heart murmur   . Hyperlipidemia   . Hypertension   . Malignant neoplasm of ascending colon ([place]) 2016   Minimally invasive right hemicolectomy to be done   . Neuromuscular disorder ([facility])    FIBROMYALGIA  . Ocular migraine   . OSA (obstructive sleep apnea) 09/2007   dx w/ a sleep study, not on  CPAP  . Osteoarthritis   . Osteoporosis   . Pneumonia    "double" in 2004  . PONV (postoperative nausea and vomiting)   . Reactive airway disease 01/29/2002   dx of pseudoasthma / vcd in 2005 and nl sprirometry History of dyspnea, 2011,  improved after several medications were changed around Question of COPD, disproved July 06, 2009 with nl pft's      . Rheumatoid factor positive   . Shingles 11/2009  . Sleep apnea   . Stroke ([place])   . TIA (transient ischemic attack)    x2 - on Plavix for this  . Torn rotator cuff    right worse than left, both are torn  . Tumor, 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  . Type II diabetes mellitus ([place])   . 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 Vaginal dysplasia       A/P/next steps: URI, likely  viral with mild reactive airway disease  Her symptoms are consistent with a viral upper respiratory infection.  She does have a minimally low-grade fever, dry cough, mild increase in shortness of breath and wheezing, chills, aches, headache and mild sore throat.  She is using his Symbicort twice daily, albuterol inhaler, drinking plenty of fluids, getting as much rest as possible and taking Tylenol as needed.  At this point I do not think she has flu or COVID-19.  She is high risk and discussed with her that we need to monitor her symptoms closely.  No indication for an antibiotic at this time.  No need for airways at this time.  She will continue the above treatment.  If her symptoms change at all, especially increased fever or shortness of breath she will call  immediately.  All of her questions were answered.  She agrees with treatment and she will call with any questions or concerns.

## 2018-04-23 ENCOUNTER — Telehealth (INDEPENDENT_AMBULATORY_CARE_PROVIDER_SITE_OTHER): Payer: Self-pay | Admitting: Radiology

## 2018-04-23 ENCOUNTER — Ambulatory Visit: Payer: Medicare Other | Admitting: Internal Medicine

## 2018-04-23 NOTE — Telephone Encounter (Signed)
Telephone visit 04/22/2018 noted. °

## 2018-04-23 NOTE — Telephone Encounter (Signed)
Left message to reschedule 05/13/2018 appointment with Dr. Lorin Mercy.

## 2018-04-24 ENCOUNTER — Ambulatory Visit: Payer: Medicare Other | Admitting: Internal Medicine

## 2018-05-02 ENCOUNTER — Telehealth: Payer: Self-pay | Admitting: Cardiology

## 2018-05-02 DIAGNOSIS — Z789 Other specified health status: Secondary | ICD-10-CM

## 2018-05-02 DIAGNOSIS — R040 Epistaxis: Secondary | ICD-10-CM

## 2018-05-02 DIAGNOSIS — I2510 Atherosclerotic heart disease of native coronary artery without angina pectoris: Secondary | ICD-10-CM

## 2018-05-02 DIAGNOSIS — M797 Fibromyalgia: Secondary | ICD-10-CM

## 2018-05-02 DIAGNOSIS — I2583 Coronary atherosclerosis due to lipid rich plaque: Secondary | ICD-10-CM

## 2018-05-02 DIAGNOSIS — E782 Mixed hyperlipidemia: Secondary | ICD-10-CM

## 2018-05-02 NOTE — Telephone Encounter (Signed)
New Message   Pt c/o medication issue:  1. Name of Medication: rosuvastatin (CRESTOR) 10 MG tablet     2. How are you currently taking this medication (dosage and times per day)?  TAKE 1 TABLET(10 MG) BY MOUTH DAILY          3. Are you having a reaction (difficulty breathing--STAT)?   4. What is your medication issue? Extreme muscle weakness, bloody nose, head feel swollen like a sinus problem and very fatigue. Overall not feeling well. She said the bloody nose has happened on and off for the past 10 days.

## 2018-05-02 NOTE — Telephone Encounter (Signed)
Please have her stop and refer to the lipid clinic for PCSK 9 inhibitors, maybe they can do a phone visit with her. Jinny Blossom, would it be possible for you to apply for PCSK 9 for this patient without seeing her? Thank you, [place] Siren

## 2018-05-02 NOTE — Telephone Encounter (Signed)
Spoke with the pt and informed her that per Dr Meda Coffee, we will d/c her rosuvastatin, update this in her allergies as an intolerance, and refer her to our Lipid clinic for a virtual telephone/video visit, for multiple statin intolerance, myalgias from statins, nosebleeds, HLP, CAD, and for consideration of PCSK9-Inhibitors.  Informed the pt that I will place the referral in the system and send a message to our Pharmacist and [facility] schedulers to call the pt back and arrange for her a virtual visit for lipid clinic. Pt verbalized understanding and agrees with this plan.

## 2018-05-02 NOTE — Telephone Encounter (Signed)
[name], pt tried to take rosuvastatin but cannot tolerate this med any longer. Its causing her muscle weakness and pain, bloody nose, and make her feel fatigue.  She has known multiple statin intolerances.  Please advise on what she should do. Thanks!

## 2018-05-04 NOTE — Progress Notes (Deleted)
Virtual Visit via Video Note  I connected with Samantha [name] on 05/04/18 at 11:00 AM EDT by a video enabled telemedicine application and verified that I am speaking with the correct person using two identifiers.   I discussed the limitations of evaluation and management by telemedicine and the availability of in person appointments. The patient expressed understanding and agreed to proceed.  The patient is currently at home and I am in the office.    No referring provider.    History of Present Illness: She is here for follow up of her chronic medical conditions.   She is not exercising regularly.    CAD, h/o TIA, chronic diastolic dysfunction, Hypertension: She is taking her medication daily. She is compliant with a low sodium diet.  She denies chest pain, palpitations, edema, shortness of breath and regular headaches. She does not monitor her blood pressure at home.    Hyperlipidemia: She is taking her medication daily. She is compliant with a low fat/cholesterol diet. She denies myalgias.   Diabetes with neuropathy: She is taking her medication daily as prescribed. She is compliant with a diabetic diet.  She monitors her sugars and they have been running XXX. She checks her feet daily and denies foot lesions. She is up-to-date with an ophthalmology examination.   Fibromyalgia:  She is taking lyrica as prescribed.  She feels her fibromyalgia is controlled.   GERD:  She is taking her medication daily as prescribed.  She denies any GERD symptoms and feels her GERD is well controlled.     Observations/Objective:   Lab Results  Component Value Date   WBC 3.8 (L) 01/16/2018   HGB 12.2 01/16/2018   HCT 38.3 01/16/2018   PLT 152 01/16/2018   GLUCOSE 136 (H) 01/21/2018   CHOL 140 10/23/2017   TRIG 78.0 10/23/2017   HDL 61.70 10/23/2017   LDLDIRECT 126.2 12/07/2010   LDLCALC 63 10/23/2017   ALT 16 01/16/2018   AST 18 01/16/2018   NA 136 01/21/2018   K 3.8 01/21/2018   CL  104 01/21/2018   CREATININE 0.91 01/21/2018   BUN 13 01/21/2018   CO2 23 01/21/2018   TSH 2.52 04/23/2017   INR 0.95 07/22/2017   HGBA1C 6.3 (H) 01/16/2018   MICROALBUR 0.6 03/03/2010    Assessment and Plan:  See Problem List for Assessment and Plan of chronic medical problems.   Follow Up Instructions:    I discussed the assessment and treatment plan with the patient. The patient was provided an opportunity to ask questions and all were answered. The patient agreed with the plan and demonstrated an understanding of the instructions.   The patient was advised to call back or seek an in-person evaluation if the symptoms worsen or if the condition fails to improve as anticipated.     [name], MD

## 2018-05-05 ENCOUNTER — Encounter: Payer: Self-pay | Admitting: Internal Medicine

## 2018-05-05 ENCOUNTER — Other Ambulatory Visit (INDEPENDENT_AMBULATORY_CARE_PROVIDER_SITE_OTHER): Payer: Medicare Other

## 2018-05-05 ENCOUNTER — Ambulatory Visit (INDEPENDENT_AMBULATORY_CARE_PROVIDER_SITE_OTHER): Payer: Medicare Other | Admitting: Internal Medicine

## 2018-05-05 ENCOUNTER — Other Ambulatory Visit: Payer: Self-pay

## 2018-05-05 VITALS — BP 136/80 | HR 72 | Temp 98.6°F | Resp 16 | Ht 62.0 in | Wt 192.0 lb

## 2018-05-05 DIAGNOSIS — Z794 Long term (current) use of insulin: Secondary | ICD-10-CM

## 2018-05-05 DIAGNOSIS — I5032 Chronic diastolic (congestive) heart failure: Secondary | ICD-10-CM

## 2018-05-05 DIAGNOSIS — I2583 Coronary atherosclerosis due to lipid rich plaque: Secondary | ICD-10-CM

## 2018-05-05 DIAGNOSIS — I2510 Atherosclerotic heart disease of native coronary artery without angina pectoris: Secondary | ICD-10-CM

## 2018-05-05 DIAGNOSIS — M150 Primary generalized (osteo)arthritis: Secondary | ICD-10-CM

## 2018-05-05 DIAGNOSIS — K219 Gastro-esophageal reflux disease without esophagitis: Secondary | ICD-10-CM | POA: Diagnosis not present

## 2018-05-05 DIAGNOSIS — M797 Fibromyalgia: Secondary | ICD-10-CM

## 2018-05-05 DIAGNOSIS — E782 Mixed hyperlipidemia: Secondary | ICD-10-CM

## 2018-05-05 DIAGNOSIS — I10 Essential (primary) hypertension: Secondary | ICD-10-CM

## 2018-05-05 DIAGNOSIS — G459 Transient cerebral ischemic attack, unspecified: Secondary | ICD-10-CM

## 2018-05-05 DIAGNOSIS — E11311 Type 2 diabetes mellitus with unspecified diabetic retinopathy with macular edema: Secondary | ICD-10-CM | POA: Diagnosis not present

## 2018-05-05 DIAGNOSIS — M159 Polyosteoarthritis, unspecified: Secondary | ICD-10-CM

## 2018-05-05 DIAGNOSIS — M8949 Other hypertrophic osteoarthropathy, multiple sites: Secondary | ICD-10-CM

## 2018-05-05 LAB — CBC WITH DIFFERENTIAL/PLATELET
Basophils Absolute: 0 10*3/uL (ref 0.0–0.1)
Basophils Relative: 0.6 % (ref 0.0–3.0)
Eosinophils Absolute: 0.1 10*3/uL (ref 0.0–0.7)
Eosinophils Relative: 3.3 % (ref 0.0–5.0)
HCT: 38.3 % (ref 36.0–46.0)
Hemoglobin: 12.8 g/dL (ref 12.0–15.0)
Lymphocytes Relative: 43.6 % (ref 12.0–46.0)
Lymphs Abs: 1.9 10*3/uL (ref 0.7–4.0)
MCHC: 33.5 g/dL (ref 30.0–36.0)
MCV: 82.9 fl (ref 78.0–100.0)
Monocytes Absolute: 0.4 10*3/uL (ref 0.1–1.0)
Monocytes Relative: 9 % (ref 3.0–12.0)
Neutro Abs: 1.9 10*3/uL (ref 1.4–7.7)
Neutrophils Relative %: 43.5 % (ref 43.0–77.0)
Platelets: 148 10*3/uL — ABNORMAL LOW (ref 150.0–400.0)
RBC: 4.62 Mil/uL (ref 3.87–5.11)
RDW: 15.3 % (ref 11.5–15.5)
WBC: 4.4 10*3/uL (ref 4.0–10.5)

## 2018-05-05 LAB — COMPREHENSIVE METABOLIC PANEL
ALT: 12 U/L (ref 0–35)
AST: 15 U/L (ref 0–37)
Albumin: 4.2 g/dL (ref 3.5–5.2)
Alkaline Phosphatase: 60 U/L (ref 39–117)
BUN: 17 mg/dL (ref 6–23)
CO2: 27 mEq/L (ref 19–32)
Calcium: 9.4 mg/dL (ref 8.4–10.5)
Chloride: 102 mEq/L (ref 96–112)
Creatinine, Ser: 0.98 mg/dL (ref 0.40–1.20)
GFR: 66.77 mL/min (ref >60.00)
Glucose, Bld: 105 mg/dL — ABNORMAL HIGH (ref 70–99)
Potassium: 4.3 mEq/L (ref 3.5–5.1)
Sodium: 138 mEq/L (ref 135–145)
Total Bilirubin: 0.5 mg/dL (ref 0.2–1.2)
Total Protein: 7.1 g/dL (ref 6.0–8.3)

## 2018-05-05 LAB — LIPID PANEL
Cholesterol: 190 mg/dL (ref 0–200)
HDL: 68.9 mg/dL (ref >39.00)
LDL Cholesterol: 98 mg/dL (ref 0–99)
NonHDL: 120.78
Total CHOL/HDL Ratio: 3
Triglycerides: 112 mg/dL (ref 0.0–149.0)
VLDL: 22.4 mg/dL (ref 0.0–40.0)

## 2018-05-05 LAB — HEMOGLOBIN A1C: Hgb A1c MFr Bld: 6.7 % — ABNORMAL HIGH (ref 4.6–6.5)

## 2018-05-05 NOTE — Assessment & Plan Note (Signed)
Following with [name] Unable to tolerate statins-cardiology getting approval for PCSK9i therapy Taking Plavix CBC, CMP, lipids

## 2018-05-05 NOTE — Telephone Encounter (Signed)
-----   Message from Erskine Emery, [facility] sent at 05/05/2018  8:25 AM EDT ----- Regarding: RE: refer to lipid clinic for multiple statin intolerance/CAD/Consideration of PCSK9-Inhibitors per Dr Nelson-Virtual visit I will call Ms, Lacorte today to discuss PCSK9i therapy. Thanks!  Georgina Peer  ----- Message ----- From: [name], LPN Sent: 09/03/9274   6:14 PM EDT To: Erskine Emery, RPH, Leeroy Bock, RPH, # Subject: refer to lipid clinic for multiple statin in#  Per Dr Meda Coffee, this pt needs to be referred to you guys for a virtual video/telephone lipid clinic appt for multiple statin intolerances, CAD, HLP, nosebleeds, myalgias, and for consideration of PCSK9-Inhibitors.  Referral is in the system and pt is aware you will call to arrange this appt via telephone/video virtual visit. Can you please call and arrange?  Thanks so much for all you do, Ivy

## 2018-05-05 NOTE — Assessment & Plan Note (Signed)
Sugars have been well controlled in general, but slightly higher at home recently related to less compliant with a diabetic diet She is very active throughout the day Check A1c Continue current medications-we will adjust if needed

## 2018-05-05 NOTE — Progress Notes (Signed)
Subjective:    Patient ID: Samantha [name], female    DOB: [birth year], 78 y.o.   MRN: [id number]  HPI She is here for follow up of her chronic medical conditions.   She is not exercising regularly, but is active.    CAD, h/o TIA, chronic diastolic dysfunction, Hypertension: She is taking her medication daily. She is compliant with a low sodium diet.  She denies palpitations, shortness of breath and regular headaches. She does monitor her blood pressure at home.    Hyperlipidemia: She was taking her medication daily -generic crestor for two weeks.  She started having aches in all her muscles, chills, feeling warm and had abd pain.  Cardiology took her off the medication three days ago.  These are similar symptoms she had with other statins.  Her symptoms are getting better.  She is compliant with a low fat/cholesterol diet.   Diabetes with neuropathy: She is taking her medication daily as prescribed. She is fairly compliant with a diabetic diet, but admits she has been less compliant recently.  She monitors her sugars and they have been running a little higher - 140. She checks her feet daily and denies foot lesions. She is up-to-date with an ophthalmology examination and has an appointment next month.   Fibromyalgia:  She is taking lyrica as prescribed.  She feels her fibromyalgia is fairly controlled.  With the recent reaction to the statin her fibromyalgia has flared a little.  She does feel that the Lyrica works well.  GERD:  She is not taking any medication at this time.  She denies any GERD symptoms and feels her GERD is well controlled as long as she avoids certain foods and does not eat too much of certain foods.     Cough: She has had a little bit of a dry cough, occasional wheeze both of which she thinks are related to allergies.  Medications and allergies reviewed with patient and updated if appropriate.  Patient Active Problem List   Diagnosis Date Noted  . Unilateral  primary osteoarthritis, right knee 01/28/2018  . 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 01/28/2018  . Lumbar stenosis 01/20/2018  . Viral gastroenteritis 01/15/2018  . Acute left-sided low back pain without sciatica 11/10/2017  . GERD (gastroesophageal reflux disease) 10/23/2017  . Sacral back pain 10/16/2017  . Chronic left upper quadrant pain 07/23/2017  . Rectal bleeding 07/23/2017  . Poor balance 03/14/2017  . Right shoulder pain 03/14/2017  . Hair loss 10/19/2016  . Angina pectoris ([place]) 03/22/2016  . Hypertensive heart disease 07/07/2015  . Essential hypertension 07/07/2015  . Plantar fasciitis, left 03/22/2015  . Coronary artery disease due to lipid rich plaque 01/05/2015  . Chronic diastolic CHF (congestive heart failure), NYHA class 2 ([place]) 01/05/2015  . 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 Migraine (Ocular) 01/05/2014  . VBI (vertebrobasilar insufficiency) 08/22/2012  . TIA (transient ischemic attack) 06/27/2012  . DJD (degenerative joint disease) 02/02/2011  . 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 Postherpetic neuralgia ? 01/02/2010  . Palpitations 11/23/2008  . UTI'S, RECURRENT 09/28/2008  . Fibromyalgia 08/15/2007  . 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 Hyperlipidemia 05/21/2006  . Reactive airway disease 01/29/2002    Current Outpatient Medications on File Prior to Visit  Medication Sig Dispense Refill  . albuterol (PROVENTIL HFA;VENTOLIN HFA) 108 (90 Base) MCG/ACT inhaler Inhale 2 puffs into  the lungs every 6 (six) hours as needed for wheezing or shortness of breath. 18 g 3  . budesonide-formoterol (SYMBICORT) 80-4.5 MCG/ACT inhaler Inhale 2 puffs into the lungs 2 (two) times daily. (Patient taking differently: Inhale 2 puffs into the lungs 2 (two) times daily as needed (for shortness of breath or wheezing). ) 1 Inhaler 3  .  butalbital-acetaminophen-caffeine (FIORICET) 50-325-40 MG tablet Take 1 tablet by mouth every 6 (six) hours as needed for headache or migraine. 30 tablet 5  . BYSTOLIC 10 MG tablet TAKE 1 TABLET(10 MG) BY MOUTH DAILY 90 tablet 1  . CALCIUM PO Take 1 tablet by mouth every morning.     . cetirizine (ZYRTEC) 10 MG tablet TAKE 1 TABLET(10 MG) BY MOUTH DAILY 90 tablet 1  . cholecalciferol (VITAMIN D) 1000 UNITS tablet Take 1,000 Units by mouth every morning.     . clopidogrel (PLAVIX) 75 MG tablet TAKE 1 TABLET(75 MG) BY MOUTH DAILY 90 tablet 1  . hydrochlorothiazide (HYDRODIURIL) 25 MG tablet Take 0.5 tablets (12.5 mg total) by mouth daily. 90 tablet 1  . HYDROcodone-acetaminophen (NORCO/VICODIN) 5-325 MG tablet Take 1 tablet by mouth every 6 (six) hours as needed for moderate pain. 30 tablet 0  . hydrocortisone 2.5 % cream Apply topically 2 (two) times daily. (Patient taking differently: Apply 1 application topically 2 (two) times daily as needed (for rash). ) 30 g 0  . isosorbide mononitrate (IMDUR) 60 MG 24 hr tablet Take 1 tablet (60 mg total) by mouth daily. 90 tablet 3  . lisinopril (PRINIVIL,ZESTRIL) 40 MG tablet Take 1 tablet (40 mg total) by mouth daily. 90 tablet 1  . metFORMIN (GLUCOPHAGE-XR) 500 MG 24 hr tablet TAKE 1 TABLET(500 MG) BY MOUTH DAILY WITH BREAKFAST 90 tablet 0  . Polyethyl Glycol-Propyl Glycol (SYSTANE) 0.4-0.3 % GEL Place 1 application into both eyes daily as needed (for dry eyes).     . Potassium 99 MG TABS Take 99 mg by mouth daily.     . pregabalin (LYRICA) 100 MG capsule TAKE 1 CAPSULE(100 MG) BY MOUTH DAILY AS NEEDED FOR PAIN 30 capsule 0  . promethazine (PHENERGAN) 25 MG tablet Take 1 tablet (25 mg total) by mouth every 8 (eight) hours as needed for nausea or vomiting. 20 tablet 0   No current facility-administered medications on file prior to visit.     Past Medical History:  Diagnosis Date  . Abnormal CT of the chest 2008   last CT4-l 2009:  . No f/u suggested    . Allergy   . Asthma   . CAD (coronary artery disease)    a. Coronary CTA 10/16: Coronary Ca score 211, mod non-obstructive CAD with LM mild plaque (25-50%), mid LAD 50-69%. b. Neg nuc 06/2015.  . Cataract    BILATERAL-REMOVED  . Chronic diastolic CHF (congestive heart failure) ([place])   . Collagen vascular disease ([place])    "arterial sclerosis" per pt  . Complication of anesthesia    trouble waking up  . Fibromyalgia   . GERD (gastroesophageal reflux disease)   . H/O hiatal hernia   . Heart murmur   . Hyperlipidemia   . Hypertension   . Malignant neoplasm of ascending colon ([place]) 2016   Minimally invasive right hemicolectomy to be done   . Neuromuscular disorder ([facility])    FIBROMYALGIA  . Ocular migraine   . OSA (obstructive sleep apnea) 09/2007   dx w/ a sleep study, not on  CPAP  . Osteoarthritis   .  Osteoporosis   . Pneumonia    "double" in 2004  . PONV (postoperative nausea and vomiting)   . Reactive airway disease 01/29/2002   dx of pseudoasthma / vcd in 2005 and nl sprirometry History of dyspnea, 2011,  improved after several medications were changed around Question of COPD, disproved July 06, 2009 with nl pft's      . Rheumatoid factor positive   . Shingles 11/2009  . Sleep apnea   . Stroke ([place])   . TIA (transient ischemic attack)    x2 - on Plavix for this  . Torn rotator cuff    right worse than left, both are torn  . Tumor, 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  . Type II diabetes mellitus ([place])   . 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 Vaginal dysplasia     Past Surgical History:  Procedure Laterality Date  . ABDOMINAL HYSTERECTOMY  1980   NO oophorectomy per pt   . ANTERIOR CERVICAL DECOMP/DISCECTOMY FUSION  2001   C 3, C4 and C5 plate and screws  . BREAST BIOPSY Right 1999  . BUNIONECTOMY Left ~ 1977  . CARDIAC CATHETERIZATION     2018 By Dr. Pernell Dupre (done after colon surgery)  . CATARACT EXTRACTION W/ INTRAOCULAR LENS  IMPLANT, BILATERAL  2012  . COLON  SURGERY  10/2014  . EYE SURGERY Bilateral    torq lens for cataracts  . LEFT HEART CATH AND CORONARY ANGIOGRAPHY N/A 03/22/2016   Procedure: Left Heart Cath and Coronary Angiography;  Surgeon: Belva Crome, MD;  Location: [facility];  Service: Cardiovascular;  Laterality: N/A;  . LUMBAR LAMINECTOMY/DECOMPRESSION MICRODISCECTOMY N/A 01/20/2018   Procedure: L4-5 decompression;  Surgeon: Marybelle Killings, MD;  Location: [facility];  Service: Orthopedics;  Laterality: N/A;  . THYROIDECTOMY, PARTIAL  1960's  . VAGINAL MASS EXCISION  1994   "Laser surgery for vaginal cancer; followed by chemotherapy" (06/27/2012)    Social History   Socioeconomic History  . Marital status: Married    Spouse name: Ilona Sorrel  . Number of children: 2  . Years of education: masters  . Highest education level: Not on file  Occupational History  . Occupation: Retired, disable since 2000    Employer: RETIRED  Social Needs  . Financial resource strain: Not hard at all  . Food insecurity:    Worry: Never true    Inability: Never true  . Transportation needs:    Medical: No    Non-medical: No  Tobacco Use  . Smoking status: Former Smoker    Packs/day: 0.25    Years: 5.00    Pack years: 1.25    Types: Cigarettes    Last attempt to quit: 01/29/1998    Years since quitting: 20.2  . Smokeless tobacco: Never Used  . Tobacco comment: Quit in 2001  Substance and Sexual Activity  . Alcohol use: No    Alcohol/week: 0.0 standard drinks  . Drug use: No  . Sexual activity: Never  Lifestyle  . Physical activity:    Days per week: 0 days    Minutes per session: 0 min  . Stress: Very much  Relationships  . Social connections:    Talks on phone: More than three times a week    Gets together: More than three times a week    Attends religious service: More than 4 times per year    Active member of club or organization: Yes    Attends meetings  of clubs or organizations: More than 4 times per year    Relationship  status: Married  Other Topics Concern  . Not on file  Social History Narrative   On disability since 2000--- also husband has MS   Education. College.   Right handed.    Family History  Problem Relation Age of Onset  . Heart disease Father   . Heart disease Mother   . Lung cancer Mother   . Allergies Sister   . Parkinsonism Sister        possible  . Asthma Sister   . Asthma Paternal Grandmother   . Stroke Paternal Grandmother   . Heart disease Other        paternal grandparents, maternal grandparents,   . Heart disease Brother   . Emphysema Brother   . Aneurysm Brother        x3  . Kidney failure Brother   . Diabetes Brother   . Diabetes Brother   . Stroke Brother   . Stroke Maternal Grandmother   . Breast cancer Neg Hx   . Colon cancer Neg Hx   . Heart attack Neg Hx     Review of Systems  Constitutional: Positive for chills. Negative for fever.  Respiratory: Positive for cough (dry cough, no congestion) and wheezing. Negative for shortness of breath.   Cardiovascular: Positive for chest pain (likely coming from shoulder - had EKG with cardiology a couple of weeks ago) and leg swelling (mild). Negative for palpitations.  Musculoskeletal: Positive for myalgias.  Neurological: Positive for light-headedness (intermittent) and headaches (intermittent).       Objective:   Vitals:   05/05/18 1107  BP: 136/80  Pulse: 72  Resp: 16  Temp: 98.6 F (37 C)  SpO2: 96%   BP Readings from Last 3 Encounters:  05/05/18 136/80  04/10/18 126/72  02/25/18 120/86   Wt Readings from Last 3 Encounters:  05/05/18 192 lb (87.1 kg)  04/10/18 180 lb (81.6 kg)  02/25/18 182 lb (82.6 kg)   Body mass index is 35.12 kg/m.   Physical Exam    Constitutional: Appears well-developed and well-nourished. No distress.  HENT:  Head: Normocephalic and atraumatic.  Neck: Neck supple. No tracheal deviation present. No thyromegaly present.  No cervical lymphadenopathy Cardiovascular:  Normal rate, regular rhythm and normal heart sounds.   No murmur heard. No carotid bruit .  No edema Pulmonary/Chest: Effort normal and breath sounds normal. No respiratory distress. No has no wheezes. No rales.  Skin: Skin is warm and dry. Not diaphoretic.  Psychiatric: Normal mood and affect. Behavior is normal.    Diabetic Foot Exam - Simple   Simple Foot Form Diabetic Foot exam was performed with the following findings:  Yes 05/05/2018 12:02 PM  Visual Inspection No deformities, no ulcerations, no other skin breakdown bilaterally:  Yes Sensation Testing Intact to touch and monofilament testing bilaterally:  Yes Pulse Check Posterior Tibialis and Dorsalis pulse intact bilaterally:  Yes Comments Bunion medial right foot.  Slightly thickened toenails, some with discoloration-hyperpigmented      Assessment & Plan:    See Problem List for Assessment and Plan of chronic medical problems.

## 2018-05-05 NOTE — Telephone Encounter (Signed)
Yes we can discuss and get PCSK9i approved if we discuss therapy on the phone with her, thanks!

## 2018-05-05 NOTE — Telephone Encounter (Signed)
Thank you Georgina Peer, what does LMOM stand for?

## 2018-05-05 NOTE — Assessment & Plan Note (Signed)
Does not tolerate statins Cardiology will hopefully get approval for PCSK9i therapy Continue Plavix

## 2018-05-05 NOTE — Telephone Encounter (Signed)
Left message on machine [facility]) - she did not answer. I will try her again later today. Thanks!

## 2018-05-05 NOTE — Patient Instructions (Addendum)
  Tests ordered today. Your results will be released to [facility] (or called to you) after review, usually within 72hours after test completion. If any changes need to be made, you will be notified at that same time.   Medications reviewed and updated.  Changes include :   none   A referral was ordered for rheumatology.   Please followup in 6 months

## 2018-05-05 NOTE — Assessment & Plan Note (Signed)
Euvolemic on exam Continue current medications Following with cardiology CMP

## 2018-05-05 NOTE — Assessment & Plan Note (Signed)
Has diffuse osteoarthritis Follows with [facility] May need to have right knee replacement Was following with rheumatology and would like to reestablish for osteoarthritis and fibromyalgia-we will refer

## 2018-05-05 NOTE — Assessment & Plan Note (Signed)
Probable slight flare now related to adverse side effects from statin Overall controlled Continue Lyrica 100 mg daily

## 2018-05-05 NOTE — Assessment & Plan Note (Signed)
BP well controlled Current regimen effective and well tolerated Continue current medications at current doses CMP 

## 2018-05-05 NOTE — Assessment & Plan Note (Signed)
Not taking any medication Controlled with avoiding certain foods and when she does eat those foods eating only a small amount

## 2018-05-05 NOTE — Assessment & Plan Note (Signed)
She was on atorvastatin for two weeks She had aches in all her muscles, chills, she would feel warm, abd pain  Cardiology stopped the medication three days ago and her symptoms are better - maybe 50% better

## 2018-05-05 NOTE — Telephone Encounter (Signed)
Pt with CAD with history of TIA. She has previously tried atorvastatin 10mg , 20mg , pravastatin 40mg , simvastatin 40mg , rosuvastatin 10mg  daily. She suffered intolerable muscle aches on these medications. Her most LDL was 63 from 10/23/17 at which time she was taking rosuvastatin 10mg  daily per our records.   LMOM to discuss PCSK9i therapy.

## 2018-05-06 ENCOUNTER — Telehealth (INDEPENDENT_AMBULATORY_CARE_PROVIDER_SITE_OTHER): Payer: Self-pay | Admitting: Radiology

## 2018-05-06 MED ORDER — EVOLOCUMAB 140 MG/ML ~~LOC~~ SOAJ: 140.0000 mg | SUBCUTANEOUS | 11 refills | Status: DC

## 2018-05-06 NOTE — Telephone Encounter (Signed)
I called patient to confirm appointment for 05/07/2018 and to ask COVID-19 screening questions. Patient states that she has had some chills over the past few weeks and that while her temperature is only 98.3, she is normally 97.1.  She states that she has had a dry cough, but thinks it is due to allergy. She has also had some abdominal pain and diarrhea. She has not traveled and to her knowledge has not come in contact with anyone who has tested positive for the coronavirus.  Patient does state that she spoke with her primary care doctor in case she had the coronavirus, but her PCP did not think so.  Please advise. Would you like to call patient to discuss care over the phone or keep appointment for tomorrow morning?

## 2018-05-06 NOTE — Telephone Encounter (Signed)
Discussed with patient and she states that statins cause intolerable muscle aches that usually resolve about a week or so after discontinuation. Discussed PCSK9i therapy including injection technique, side/effects, anticipated benefit, and cost obligations. She would like to proceed with therapy. She has Forensic scientist. Will obtain coverage then determine cost.   [employer] ID: [id number]  Will contact her once approved. She states understanding and all questions were answered.

## 2018-05-06 NOTE — Addendum Note (Signed)
Addended by: Erskine Emery on: 05/06/2018 03:10 PM   Modules accepted: Orders

## 2018-05-06 NOTE — Telephone Encounter (Signed)
Pt approved for Repatha through [facility]. RX sent to pharmacy as per conversation to determine cost.   Pt aware to call if cost-prohibitive or once starts medication so that labs can be ordered in 2-3 months.

## 2018-05-06 NOTE — Telephone Encounter (Signed)
I called. Reschedule for 2 months. Back doing well, left shoulder worse, knee is better. ROV 2 months thanks

## 2018-05-07 ENCOUNTER — Ambulatory Visit (INDEPENDENT_AMBULATORY_CARE_PROVIDER_SITE_OTHER): Payer: Medicare Other | Admitting: Orthopaedic Surgery

## 2018-05-08 ENCOUNTER — Telehealth: Payer: Self-pay | Admitting: Cardiology

## 2018-05-08 NOTE — Telephone Encounter (Signed)
New Message    Pt is calling to speak with Claiborne Billings the pharmacist  She said she picked up her medicine and she is going to start taking it today  But she wanted to call first incase theirs anything she needed to know   Please call back

## 2018-05-08 NOTE — Telephone Encounter (Signed)
She called to review and make sure her instructions for injection were correct. She will start this today. She is already scheduled for labs in Sept. For now will keep this as scheduled due to [place]. If safe to bring her in sooner will plan to check cholesterol sooner.

## 2018-05-13 ENCOUNTER — Ambulatory Visit (INDEPENDENT_AMBULATORY_CARE_PROVIDER_SITE_OTHER): Payer: Medicare Other | Admitting: Orthopaedic Surgery

## 2018-05-13 ENCOUNTER — Telehealth: Payer: Self-pay | Admitting: Cardiology

## 2018-05-13 NOTE — Telephone Encounter (Signed)
Pt called because her insurance company denied coverage for  Evolocumab (REPATHA SURECLICK) 161 MG/ML SOAJ  Patient needs a letter sent from [name] to  [facility], her [employer] to cover the medication. Insurance says the drug is out of formulary. Letter has to state that this is medically necessary for the patient to be on it before the insurance company will cover it.  Pt states she has tried all of the aternate medications, and she is allergic to them

## 2018-05-13 NOTE — Telephone Encounter (Signed)
Patient picked up Repatha rx 1 week ago, called pharmacy and they reported no issues filling rx which means her insurance has approved coverage. Called pt to see when she received this denial letter, reports [employer] sent denial letter on 4/9 stating that a temporary supply was approved, Repatha is on formulary but requires a prior authorization for continued coverage. This has already been done and is approved. Advised pt to call us if she has trouble getting her next Repatha refill from her pharmacy.

## 2018-05-14 NOTE — Telephone Encounter (Signed)
I called patient and scheduled follow up appointment for 07/02/2018 at 10am.

## 2018-05-15 ENCOUNTER — Other Ambulatory Visit: Payer: Self-pay | Admitting: Cardiology

## 2018-06-04 ENCOUNTER — Telehealth: Payer: Self-pay | Admitting: Cardiology

## 2018-06-04 NOTE — Telephone Encounter (Signed)
Believe this is in regards to [place]

## 2018-06-04 NOTE — Telephone Encounter (Signed)
New message;  Rosa from [facility] and states patient needs a preauthorized on medication.

## 2018-06-04 NOTE — Telephone Encounter (Signed)
Appeals was submitted yesterday, awaiting insurance decision.

## 2018-06-09 DIAGNOSIS — M25512 Pain in left shoulder: Secondary | ICD-10-CM | POA: Diagnosis not present

## 2018-06-09 DIAGNOSIS — M19071 Primary osteoarthritis, right ankle and foot: Secondary | ICD-10-CM | POA: Diagnosis not present

## 2018-06-09 DIAGNOSIS — E785 Hyperlipidemia, unspecified: Secondary | ICD-10-CM | POA: Diagnosis not present

## 2018-06-09 DIAGNOSIS — M19012 Primary osteoarthritis, left shoulder: Secondary | ICD-10-CM | POA: Diagnosis not present

## 2018-06-09 DIAGNOSIS — M79671 Pain in right foot: Secondary | ICD-10-CM | POA: Diagnosis not present

## 2018-06-09 DIAGNOSIS — M25571 Pain in right ankle and joints of right foot: Secondary | ICD-10-CM | POA: Diagnosis not present

## 2018-06-09 DIAGNOSIS — M79672 Pain in left foot: Secondary | ICD-10-CM | POA: Diagnosis not present

## 2018-06-09 DIAGNOSIS — E119 Type 2 diabetes mellitus without complications: Secondary | ICD-10-CM | POA: Diagnosis not present

## 2018-06-09 DIAGNOSIS — M25511 Pain in right shoulder: Secondary | ICD-10-CM | POA: Diagnosis not present

## 2018-06-09 DIAGNOSIS — M25572 Pain in left ankle and joints of left foot: Secondary | ICD-10-CM | POA: Diagnosis not present

## 2018-06-09 DIAGNOSIS — M79642 Pain in left hand: Secondary | ICD-10-CM | POA: Diagnosis not present

## 2018-06-09 DIAGNOSIS — M19011 Primary osteoarthritis, right shoulder: Secondary | ICD-10-CM | POA: Diagnosis not present

## 2018-06-09 DIAGNOSIS — M797 Fibromyalgia: Secondary | ICD-10-CM | POA: Diagnosis not present

## 2018-06-09 DIAGNOSIS — M79641 Pain in right hand: Secondary | ICD-10-CM | POA: Diagnosis not present

## 2018-06-09 DIAGNOSIS — I10 Essential (primary) hypertension: Secondary | ICD-10-CM | POA: Diagnosis not present

## 2018-06-09 DIAGNOSIS — Z8739 Personal history of other diseases of the musculoskeletal system and connective tissue: Secondary | ICD-10-CM | POA: Diagnosis not present

## 2018-06-09 DIAGNOSIS — M1990 Unspecified osteoarthritis, unspecified site: Secondary | ICD-10-CM | POA: Diagnosis not present

## 2018-06-09 DIAGNOSIS — M19072 Primary osteoarthritis, left ankle and foot: Secondary | ICD-10-CM | POA: Diagnosis not present

## 2018-06-09 DIAGNOSIS — M180 Bilateral primary osteoarthritis of first carpometacarpal joints: Secondary | ICD-10-CM | POA: Diagnosis not present

## 2018-06-13 ENCOUNTER — Telehealth: Payer: Self-pay | Admitting: Cardiology

## 2018-06-13 NOTE — Telephone Encounter (Signed)
New Message:    Pt say her insurance will not pay for her [place]. She needs you to submit another a letter of appeal. Pt says she can not be switched to another medicine.

## 2018-06-16 NOTE — Telephone Encounter (Signed)
Appeals has been approved.

## 2018-06-16 NOTE — Telephone Encounter (Signed)
Her insurance does cover Repatha, called pharmacy and pt is trying to refill her rx too soon. She just picked up a 1 month supply on 5/7. Called pt to advise her to call pharmacy for refill ~3 days before she is due for her next injection. Pt states she had received a letter from [facility] about a denial - advised pt that this has been overturned and her insurance will cover her [place].

## 2018-06-27 ENCOUNTER — Other Ambulatory Visit: Payer: Self-pay | Admitting: Internal Medicine

## 2018-06-29 ENCOUNTER — Other Ambulatory Visit: Payer: Self-pay | Admitting: Internal Medicine

## 2018-06-29 DIAGNOSIS — R0602 Shortness of breath: Secondary | ICD-10-CM

## 2018-06-29 DIAGNOSIS — R42 Dizziness and giddiness: Secondary | ICD-10-CM

## 2018-06-29 DIAGNOSIS — I2510 Atherosclerotic heart disease of native coronary artery without angina pectoris: Secondary | ICD-10-CM

## 2018-06-29 DIAGNOSIS — I5032 Chronic diastolic (congestive) heart failure: Secondary | ICD-10-CM

## 2018-06-29 DIAGNOSIS — I10 Essential (primary) hypertension: Secondary | ICD-10-CM

## 2018-06-29 DIAGNOSIS — R5383 Other fatigue: Secondary | ICD-10-CM

## 2018-06-30 NOTE — Telephone Encounter (Signed)
Last OV 05/05/18 Next OV  11/05/18  [place] Controlled Substance Database checked. Last filled on 03/26/18

## 2018-07-02 ENCOUNTER — Encounter: Payer: Self-pay | Admitting: Orthopaedic Surgery

## 2018-07-02 ENCOUNTER — Other Ambulatory Visit: Payer: Self-pay

## 2018-07-02 ENCOUNTER — Ambulatory Visit (INDEPENDENT_AMBULATORY_CARE_PROVIDER_SITE_OTHER): Payer: Medicare Other | Admitting: Orthopaedic Surgery

## 2018-07-02 VITALS — Ht 62.0 in | Wt 187.0 lb

## 2018-07-02 DIAGNOSIS — Z9889 Other specified postprocedural states: Secondary | ICD-10-CM | POA: Diagnosis not present

## 2018-07-02 DIAGNOSIS — M069 Rheumatoid arthritis, unspecified: Secondary | ICD-10-CM | POA: Diagnosis not present

## 2018-07-02 DIAGNOSIS — M797 Fibromyalgia: Secondary | ICD-10-CM | POA: Diagnosis not present

## 2018-07-02 DIAGNOSIS — I2510 Atherosclerotic heart disease of native coronary artery without angina pectoris: Secondary | ICD-10-CM | POA: Diagnosis not present

## 2018-07-02 DIAGNOSIS — I10 Essential (primary) hypertension: Secondary | ICD-10-CM | POA: Diagnosis not present

## 2018-07-02 DIAGNOSIS — M1711 Unilateral primary osteoarthritis, right knee: Secondary | ICD-10-CM

## 2018-07-02 DIAGNOSIS — E119 Type 2 diabetes mellitus without complications: Secondary | ICD-10-CM | POA: Diagnosis not present

## 2018-07-02 DIAGNOSIS — M79645 Pain in left finger(s): Secondary | ICD-10-CM | POA: Diagnosis not present

## 2018-07-02 DIAGNOSIS — M7542 Impingement syndrome of left shoulder: Secondary | ICD-10-CM | POA: Diagnosis not present

## 2018-07-02 DIAGNOSIS — M1990 Unspecified osteoarthritis, unspecified site: Secondary | ICD-10-CM | POA: Diagnosis not present

## 2018-07-02 DIAGNOSIS — Z8739 Personal history of other diseases of the musculoskeletal system and connective tissue: Secondary | ICD-10-CM | POA: Diagnosis not present

## 2018-07-02 DIAGNOSIS — E785 Hyperlipidemia, unspecified: Secondary | ICD-10-CM | POA: Diagnosis not present

## 2018-07-02 NOTE — Progress Notes (Signed)
Office Visit Note   Patient: Samantha [name]           Date of Birth: Dec 15, 1940           MRN: [id number] Visit Date: 07/02/2018              Requested by: [name], MD [place], [place] [address] PCP: [name], MD   Assessment & Plan: Visit Diagnoses:  1. Unilateral primary osteoarthritis, right knee   2. 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   3. Impingement syndrome of left shoulder     Plan: I plan to recheck her in 3 months.  She will need to have her daughter available to help with her husband care due to his bilateral amputation before she could consider right total knee arthroplasty.  She has been through maximum conservative care for her knee including intra-articular cortisone injections.  She is trying to work on some weight loss to help with her diabetes.  Last A1c was 6.7.  Prior to that it was 6.3.  Recheck 3 months if she has significant increase in pain she can return earlier.  Follow-Up Instructions: Return in about 3 months (around 10/02/2018).   Orders:  No orders of the defined types were placed in this encounter.  No orders of the defined types were placed in this encounter.     Procedures: No procedures performed   Clinical Data: No additional findings.   Subjective: Chief Complaint  Patient presents with   Lower Back - Follow-up    01/20/18 L4-5 Decompression   Right Knee - Follow-up   Left Shoulder - Pain    HPI 78 year old female returns with ongoing problems with end-stage right knee osteoarthritis.  She was waiting on total knee arthroplasty but unfortunately her daughter who was in her 69s suddenly had myocardial infarction acute had a stent and now spacing upcoming triple bypass surgery.  Her daughter was helping take care of patient's husband who is a bilateral amputee with MS and does not ambulate.  Patient is not able to proceed with total knee arthroplasty at this time  until her daughter is available to help with patient's husband.  She has had increased problems with her left shoulder where she has bilateral shoulder osteoarthritis worse on the right shoulder than the left with prominent subacromial spurring.  Rheumatologist put her on prednisone this is helped and she comes off this in a few more days.  She states the prednisone also helped her right knee pain.  She is trying to do some walking to help her back.  Previous decompression L4-5 by me 01/20/2018 is doing well with good relief neurogenic claudication symptoms.  Unfortunately when she walks more she is having increased right knee pain symptoms.  Review of Systems 14 point review of systems updated.  She does have type 2 diabetes history of heart failure class II, hypertension, GERD, shoulder are arthritis right greater than left.  Right knee osteoarthritis.  History of cancer 2016 otherwise negative is obtains HPI.   Objective: Vital Signs: Ht 5\' 2"  (1.575 m)    Wt 187 lb (84.8 kg)    BMI 34.20 kg/m   Physical Exam Constitutional:      Appearance: She is well-developed.  HENT:     Head: Normocephalic.     Right Ear: External ear normal.     Left Ear: External ear normal.  Eyes:     Pupils: Pupils  are equal, round, and reactive to light.  Neck:     Thyroid: No thyromegaly.     Trachea: No tracheal deviation.  Cardiovascular:     Rate and Rhythm: Normal rate.  Pulmonary:     Effort: Pulmonary effort is normal.  Abdominal:     Palpations: Abdomen is soft.  Skin:    General: Skin is warm and dry.  Neurological:     Mental Status: She is alert and oriented to person, place, and time.  Psychiatric:        Behavior: Behavior normal.     Ortho Exam patient has crepitus with right knee range of motion palpable osteophytes.  She is amatory with a right knee limp.  She is having difficulty with the left shoulder reaching overhead has positive impingement positive Neer negative Yergason test  Long head of the biceps minimally tender.  Negative drop arm test.  Good cervical range of motion no brachial plexus tenderness. Specialty Comments:  No specialty comments available.  Imaging: No results found.   PMFS History: Patient Active Problem List   Diagnosis Date Noted   Unilateral primary osteoarthritis, right knee 01/28/2018   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 01/28/2018   Viral gastroenteritis 01/15/2018   GERD (gastroesophageal reflux disease) 10/23/2017   Sacral back pain 10/16/2017   Chronic left upper quadrant pain 07/23/2017   Rectal bleeding 07/23/2017   Poor balance 03/14/2017   Right shoulder pain 03/14/2017   Hair loss 10/19/2016   Angina pectoris ([place]) 03/22/2016   Hypertensive heart disease 07/07/2015   Essential hypertension 07/07/2015   Plantar fasciitis, left 03/22/2015   Coronary artery disease due to lipid rich plaque 01/05/2015   Chronic diastolic CHF (congestive heart failure), NYHA class 2 ([place]) 01/05/2015   Malignant neoplasm of ascending colon  pT1, pN0, 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 01/02/2010   Palpitations 11/23/2008   UTI'S, RECURRENT 09/28/2008   Fibromyalgia 08/15/2007   DM II (diabetes mellitus, type II), w/ neuropathy 05/21/2006   Hyperlipidemia 05/21/2006   Reactive airway disease 01/29/2002   Past Medical History:  Diagnosis Date   Abnormal CT of the chest 2008   last CT4-l 2009:  . No f/u suggested    Allergy    Asthma    CAD (coronary artery disease)    a. Coronary CTA 10/16: Coronary Ca score 211, mod non-obstructive CAD with LM mild plaque (25-50%), mid LAD 50-69%. b. Neg nuc 06/2015.   Cataract     BILATERAL-REMOVED   Chronic diastolic CHF (congestive heart failure) (HCC)    Collagen vascular disease ([place])    "arterial sclerosis" per pt   Complication of anesthesia    trouble waking up   Fibromyalgia    GERD (gastroesophageal reflux disease)    H/O hiatal hernia    Heart murmur    Hyperlipidemia    Hypertension    Malignant neoplasm of ascending colon ([place]) 2016   Minimally invasive right hemicolectomy to be done    Neuromuscular disorder (HCC)    FIBROMYALGIA   Ocular migraine    OSA (obstructive sleep apnea) 09/2007   dx w/ a sleep study, not on  CPAP   Osteoarthritis    Osteoporosis    Pneumonia    "double" in  2004   PONV (postoperative nausea and vomiting)    Reactive airway disease 01/29/2002   dx of pseudoasthma / vcd in 2005 and nl sprirometry History of dyspnea, 2011,  improved after several medications were changed around Question of COPD, disproved July 06, 2009 with nl pft's       Rheumatoid factor positive    Shingles 11/2009   Sleep apnea    Stroke ([place])    TIA (transient ischemic attack)    x2 - on Plavix for this   Torn rotator cuff    right worse than left, both are torn   Tumor, 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   Type II diabetes mellitus ([place])    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   Vaginal dysplasia     Family History  Problem Relation Age of Onset   Heart disease Father    Heart disease Mother    Lung cancer Mother    Allergies Sister    Parkinsonism Sister        possible   Asthma Sister    Asthma Paternal Grandmother    Stroke Paternal Grandmother    Heart disease Other        paternal grandparents, maternal grandparents,    Heart disease Brother    Emphysema Brother    Aneurysm Brother        x3   Kidney failure Brother    Diabetes Brother    Diabetes Brother    Stroke Brother    Stroke Maternal Grandmother    Breast cancer Neg Hx    Colon cancer Neg Hx    Heart attack Neg Hx       Past Surgical History:  Procedure Laterality Date   ABDOMINAL HYSTERECTOMY  1980   NO oophorectomy per pt    ANTERIOR CERVICAL DECOMP/DISCECTOMY FUSION  2001   C 3, C4 and C5 plate and screws   BREAST BIOPSY Right 1999   BUNIONECTOMY Left ~ [place]     2018 By Dr. Pernell Dupre (done after colon surgery)   CATARACT EXTRACTION W/ INTRAOCULAR LENS  IMPLANT, BILATERAL  2012   COLON SURGERY  10/2014   EYE SURGERY Bilateral    torq lens for cataracts   LEFT HEART CATH AND CORONARY ANGIOGRAPHY N/A 03/22/2016   Procedure: Left Heart Cath and Coronary Angiography;  Surgeon: Belva Crome, MD;  Location: [facility];  Service: Cardiovascular;  Laterality: N/A;   LUMBAR LAMINECTOMY/DECOMPRESSION MICRODISCECTOMY N/A 01/20/2018   Procedure: L4-5 decompression;  Surgeon: Marybelle Killings, MD;  Location: [facility];  Service: Orthopedics;  Laterality: N/A;   THYROIDECTOMY, PARTIAL  1960's   VAGINAL MASS EXCISION  1994   "Laser surgery for vaginal cancer; followed by chemotherapy" (06/27/2012)   Social History   Occupational History   Occupation: Retired, disable since 2000    Employer: RETIRED  Tobacco Use   Smoking status: Former Smoker    Packs/day: 0.25    Years: 5.00    Pack years: 1.25    Types: Cigarettes    Last attempt to quit: 01/29/1998    Years since quitting: 20.4   Smokeless tobacco: Never Used   Tobacco comment: Quit in 2001  Substance and Sexual Activity   Alcohol use: No    Alcohol/week: 0.0 standard drinks   Drug use: No   Sexual activity: Never

## 2018-07-09 ENCOUNTER — Telehealth: Payer: Self-pay | Admitting: Cardiology

## 2018-07-09 ENCOUNTER — Other Ambulatory Visit: Payer: Self-pay

## 2018-07-09 ENCOUNTER — Encounter (HOSPITAL_COMMUNITY): Payer: Self-pay

## 2018-07-09 ENCOUNTER — Telehealth: Payer: Self-pay

## 2018-07-09 ENCOUNTER — Ambulatory Visit (HOSPITAL_COMMUNITY)
Admission: EM | Admit: 2018-07-09 | Discharge: 2018-07-09 | Disposition: A | Discharge status: Home / Self Care | Payer: Medicare Other | Attending: Family Medicine | Admitting: Family Medicine

## 2018-07-09 ENCOUNTER — Ambulatory Visit (INDEPENDENT_AMBULATORY_CARE_PROVIDER_SITE_OTHER): Payer: Medicare Other

## 2018-07-09 DIAGNOSIS — S161XXA Strain of muscle, fascia and tendon at neck level, initial encounter: Secondary | ICD-10-CM

## 2018-07-09 DIAGNOSIS — I10 Essential (primary) hypertension: Secondary | ICD-10-CM

## 2018-07-09 DIAGNOSIS — M542 Cervicalgia: Secondary | ICD-10-CM | POA: Diagnosis not present

## 2018-07-09 MED ORDER — TIZANIDINE HCL 4 MG PO TABS: 4.0000 mg | ORAL_TABLET | Freq: Four times a day (QID) | ORAL | 0 refills | Status: AC | PRN

## 2018-07-09 NOTE — Telephone Encounter (Signed)
FYI it looks like patient is going to UC.

## 2018-07-09 NOTE — Discharge Instructions (Signed)
Take muscle relaxers only when needed - avoid driving, alcohol. Keep appointment on Monday with your ortho provider.

## 2018-07-09 NOTE — Telephone Encounter (Signed)
New Message     Patient calling stating she has a constant pain in the back of her head and neck area where she has a metal plate with 8 screws.  Would like a nurse to call to see if she could get an xray to check it out.

## 2018-07-09 NOTE — Telephone Encounter (Signed)
Copied from [place] [id number]. Topic: General - Inquiry >> Jul 09, 2018 12:24 PM Scherrie Gerlach wrote: Reason for CRM:  pt calling to let the dr know she is having neck pain and she thinks something going on with her neck plate.  Maybe it has slipped, come loose, she just does not know. Pt states ortho cannot see her until Monday, but she cannot wait until Monday, as the pain is that bad..  Pt is going to UC now and wanted Dr to know.

## 2018-07-09 NOTE — ED Provider Notes (Signed)
[place]    CSN: [id number] Arrival date & time: 07/09/18  1452     History   Chief Complaint Chief Complaint  Patient presents with  . Neck Pain    HPI Samantha [name] is a 78 y.o. female with history of fibromyalgia, hypertension, chronic bilateral shoulder pain status post bilateral torn rotator cuff presenting for neck pain.  Patient states she was sitting down for dinner Thursday evening, looked down and felt a pop in her neck.  Patient states since then she has experienced increased right-sided neck and upper back tightness, decreased range of motion, and shoulder pain that is "nearly constant".  Patient underwent anterior cervical decompression/discectomy fusion of C3, C4, and C5 with plate and screws in 7591 and is concerned that she may have a loose screw.  Patient denies change in vision, upper or lower extremity paresthesias or weakness, mid to low back pain that is changed from her baseline.    Past Medical History:  Diagnosis Date  . Abnormal CT of the chest 2008   last CT4-l 2009:  . No f/u suggested   . Allergy   . Asthma   . CAD (coronary artery disease)    a. Coronary CTA 10/16: Coronary Ca score 211, mod non-obstructive CAD with LM mild plaque (25-50%), mid LAD 50-69%. b. Neg nuc 06/2015.  . Cataract    BILATERAL-REMOVED  . Chronic diastolic CHF (congestive heart failure) ([place])   . Collagen vascular disease ([place])    "arterial sclerosis" per pt  . Complication of anesthesia    trouble waking up  . Fibromyalgia   . GERD (gastroesophageal reflux disease)   . H/O hiatal hernia   . Heart murmur   . Hyperlipidemia   . Hypertension   . Malignant neoplasm of ascending colon ([place]) 2016   Minimally invasive right hemicolectomy to be done   . Neuromuscular disorder ([facility])    FIBROMYALGIA  . Ocular migraine   . OSA (obstructive sleep apnea) 09/2007   dx w/ a sleep study, not on  CPAP  . Osteoarthritis   . Osteoporosis   . Pneumonia    "double"  in 2004  . PONV (postoperative nausea and vomiting)   . Reactive airway disease 01/29/2002   dx of pseudoasthma / vcd in 2005 and nl sprirometry History of dyspnea, 2011,  improved after several medications were changed around Question of COPD, disproved July 06, 2009 with nl pft's      . Rheumatoid factor positive   . Shingles 11/2009  . Sleep apnea   . Stroke ([place])   . TIA (transient ischemic attack)    x2 - on Plavix for this  . Torn rotator cuff    right worse than left, both are torn  . Tumor, 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  . Type II diabetes mellitus ([place])   . 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 Vaginal dysplasia     Patient Active Problem List   Diagnosis Date Noted  . Unilateral primary osteoarthritis, right knee 01/28/2018  . 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 01/28/2018  . Viral gastroenteritis 01/15/2018  . GERD (gastroesophageal reflux disease) 10/23/2017  . Sacral back pain 10/16/2017  . Chronic left upper quadrant pain 07/23/2017  . Rectal bleeding 07/23/2017  . Poor balance 03/14/2017  . Right shoulder pain 03/14/2017  . Hair loss 10/19/2016  . Angina pectoris ([place]) 03/22/2016  . Hypertensive heart disease 07/07/2015  .  Essential hypertension 07/07/2015  . Plantar fasciitis, left 03/22/2015  . Coronary artery disease due to lipid rich plaque 01/05/2015  . Chronic diastolic CHF (congestive heart failure), NYHA class 2 ([place]) 01/05/2015  . 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 Migraine (Ocular) 01/05/2014  . VBI (vertebrobasilar insufficiency) 08/22/2012  . TIA (transient ischemic attack) 06/27/2012  . DJD (degenerative joint disease) 02/02/2011  . 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 Postherpetic neuralgia ? 01/02/2010  . Palpitations 11/23/2008  . UTI'S, RECURRENT 09/28/2008  . Fibromyalgia 08/15/2007  . DM II (diabetes mellitus, type II), w/  neuropathy 05/21/2006  . Hyperlipidemia 05/21/2006  . Reactive airway disease 01/29/2002    Past Surgical History:  Procedure Laterality Date  . ABDOMINAL HYSTERECTOMY  1980   NO oophorectomy per pt   . ANTERIOR CERVICAL DECOMP/DISCECTOMY FUSION  2001   C 3, C4 and C5 plate and screws  . BREAST BIOPSY Right 1999  . BUNIONECTOMY Left ~ 1977  . CARDIAC CATHETERIZATION     2018 By Dr. Pernell Dupre (done after colon surgery)  . CATARACT EXTRACTION W/ INTRAOCULAR LENS  IMPLANT, BILATERAL  2012  . COLON SURGERY  10/2014  . EYE SURGERY Bilateral    torq lens for cataracts  . LEFT HEART CATH AND CORONARY ANGIOGRAPHY N/A 03/22/2016   Procedure: Left Heart Cath and Coronary Angiography;  Surgeon: Belva Crome, MD;  Location: [facility];  Service: Cardiovascular;  Laterality: N/A;  . LUMBAR LAMINECTOMY/DECOMPRESSION MICRODISCECTOMY N/A 01/20/2018   Procedure: L4-5 decompression;  Surgeon: Marybelle Killings, MD;  Location: [facility];  Service: Orthopedics;  Laterality: N/A;  . THYROIDECTOMY, PARTIAL  1960's  . VAGINAL MASS EXCISION  1994   "Laser surgery for vaginal cancer; followed by chemotherapy" (06/27/2012)    OB History    Gravida  2   Para      Term      Preterm      AB      Living        SAB      TAB      Ectopic      Multiple      Live Births               Home Medications    Prior to Admission medications   Medication Sig Start Date End Date Taking? Authorizing Provider  albuterol (PROVENTIL HFA;VENTOLIN HFA) 108 (90 Base) MCG/ACT inhaler Inhale 2 puffs into the lungs every 6 (six) hours as needed for wheezing or shortness of breath. 02/07/17   [name], MD  budesonide-formoterol (SYMBICORT) 80-4.5 MCG/ACT inhaler Inhale 2 puffs into the lungs 2 (two) times daily. Patient taking differently: Inhale 2 puffs into the lungs 2 (two) times daily as needed (for shortness of breath or wheezing).  04/18/16   [name], MD  butalbital-acetaminophen-caffeine  (FIORICET) [phone] MG tablet Take 1 tablet by mouth every 6 (six) hours as needed for headache or migraine. 01/14/17   [name], MD  BYSTOLIC 10 MG tablet TAKE 1 TABLET(10 MG) BY MOUTH DAILY 03/31/18   [name], MD  CALCIUM PO Take 1 tablet by mouth every morning.     [provider]  cetirizine (ZYRTEC) 10 MG tablet TAKE 1 TABLET(10 MG) BY MOUTH DAILY 04/14/18   [name], MD  cholecalciferol (VITAMIN D) 1000 UNITS tablet Take 1,000 Units by mouth every morning.     [provider]  clopidogrel (PLAVIX) 75 MG tablet TAKE 1 TABLET(75 MG) BY MOUTH DAILY 02/17/18   Burns, Claudina Lick, MD  Evolocumab (REPATHA SURECLICK) 976 MG/ML SOAJ Inject 140 mg into the skin every 14 (fourteen) days. 05/06/18   [name], MD  hydrochlorothiazide (HYDRODIURIL) 25 MG tablet Take 0.5 tablets (12.5 mg total) by mouth daily. 08/16/17   [name], MD  HYDROcodone-acetaminophen (NORCO/VICODIN) 5-325 MG tablet Take 1 tablet by mouth every 6 (six) hours as needed for moderate pain. 01/28/18   Marybelle Killings, MD  hydrocortisone 2.5 % cream Apply topically 2 (two) times daily. Patient taking differently: Apply 1 application topically 2 (two) times daily as needed (for rash).  03/17/15   Colon Branch, MD  isosorbide mononitrate (IMDUR) 60 MG 24 hr tablet Take 1 tablet (60 mg total) by mouth daily. 12/16/17   [name], MD  lisinopril (PRINIVIL,ZESTRIL) 20 MG tablet TAKE 1 TABLET(20 MG) BY MOUTH DAILY 05/15/18   [name], MD  lisinopril (PRINIVIL,ZESTRIL) 40 MG tablet Take 1 tablet (40 mg total) by mouth daily. 08/02/17   [name], MD  metFORMIN (GLUCOPHAGE-XR) 500 MG 24 hr tablet TAKE 1 TABLET(500 MG) BY MOUTH DAILY WITH BREAKFAST 06/27/18   Burns, Claudina Lick, MD  Polyethyl Glycol-Propyl Glycol (SYSTANE) 0.4-0.3 % GEL Place 1 application into both eyes daily as needed (for dry eyes).     [provider]  Potassium 99 MG TABS Take 99 mg by mouth  daily.     [provider]  predniSONE (DELTASONE) 10 MG tablet TK 1 T PO QD 06/09/18   [provider]  pregabalin (LYRICA) 100 MG capsule TAKE 1 CAPSULE(100 MG) BY MOUTH DAILY AS NEEDED FOR PAIN 07/01/18   [name], MD  promethazine (PHENERGAN) 25 MG tablet Take 1 tablet (25 mg total) by mouth every 8 (eight) hours as needed for nausea or vomiting. 02/07/17   [name], MD  tiZANidine (ZANAFLEX) 4 MG tablet Take 1 tablet (4 mg total) by mouth every 6 (six) hours as needed for up to 5 days for muscle spasms. 07/09/18 07/14/18  Hall-Potvin, [place], PA-C    Family History Family History  Problem Relation Age of Onset  . Heart disease Father   . Heart disease Mother   . Lung cancer Mother   . Allergies Sister   . Parkinsonism Sister        possible  . Asthma Sister   . Asthma Paternal Grandmother   . Stroke Paternal Grandmother   . Heart disease Other        paternal grandparents, maternal grandparents,   . Heart disease Brother   . Emphysema Brother   . Aneurysm Brother        x3  . Kidney failure Brother   . Diabetes Brother   . Diabetes Brother   . Stroke Brother   . Stroke Maternal Grandmother   . Breast cancer Neg Hx   . Colon cancer Neg Hx   . Heart attack Neg Hx     Social History Social History   Tobacco Use  . Smoking status: Former Smoker    Packs/day: 0.25    Years: 5.00    Pack years: 1.25    Types: Cigarettes    Last attempt to quit: 01/29/1998    Years since quitting: 20.4  . Smokeless tobacco: Never Used  . Tobacco comment: Quit in 2001  Substance Use Topics  . Alcohol use: No  Alcohol/week: 0.0 standard drinks  . Drug use: No     Allergies   Bactrim [sulfamethoxazole-trimethoprim]; Cefuroxime axetil; Oxycodone; Pravastatin; Seldane [terfenadine]; Zocor [simvastatin]; Tramadol; Atorvastatin; Lime flavor [flavoring agent]; Rosuvastatin; and Tape   Review of Systems As per HPI   Physical Exam Triage Vital Signs ED  Triage Vitals  Enc Vitals Group     BP 07/09/18 1519 (!) 154/86     Pulse Rate 07/09/18 1519 66     Resp 07/09/18 1519 18     Temp 07/09/18 1519 98.3 F (36.8 C)     Temp src --      SpO2 07/09/18 1519 97 %     Weight 07/09/18 1517 187 lb (84.8 kg)     Height --      Head Circumference --      Peak Flow --      Pain Score 07/09/18 1517 8     Pain Loc --      Pain Edu? --      Excl. in [place]? --    No data found.  Updated Vital Signs BP (!) 154/86 (BP Location: Right Arm)   Pulse 66   Temp 98.3 F (36.8 C)   Resp 18   Wt 187 lb (84.8 kg)   SpO2 97%   BMI 34.20 kg/m   Visual Acuity Right Eye Distance:   Left Eye Distance:   Bilateral Distance:    Right Eye Near:   Left Eye Near:    Bilateral Near:     Physical Exam Vitals signs reviewed.  Constitutional:      General: She is not in acute distress. HENT:     Head: Normocephalic and atraumatic.  Eyes:     General: No scleral icterus.    Pupils: Pupils are equal, round, and reactive to light.  Cardiovascular:     Rate and Rhythm: Normal rate.  Pulmonary:     Effort: Pulmonary effort is normal.  Musculoskeletal:     Comments: No obvious bony deformity, ecchymosis, erythema.  Overall range of motion limited secondary stiffness and pain.  Patient has worse ROM when turning head to left.  Patient states this is different from her baseline and causes pain in her right neck and shoulder. Upper extremity strength 5/5 bilaterally and symmetric.  Grip strength 5/5 bilaterally.  Upper extremities and NVI bilaterally/symmetric.  No thoracic or lumbar paravertebral or vertebral tenderness.  Skin:    Coloration: Skin is not jaundiced or pale.  Neurological:     Mental Status: She is alert and oriented to person, place, and time.  Psychiatric:        Mood and Affect: Mood normal.        Behavior: Behavior normal.        Thought Content: Thought content normal.      UC Treatments / Results  Labs (all labs ordered are  listed, but only abnormal results are displayed) Labs Reviewed - No data to display  EKG None  Radiology Dg Cervical Spine Complete  Result Date: 07/09/2018 CLINICAL DATA:  Acute neck pain. EXAM: CERVICAL SPINE - COMPLETE 4+ VIEW COMPARISON:  None. FINDINGS: Status post surgical anterior fusion of C3-4, C4-5 and C5-6. No fracture or spondylolisthesis is noted. Severe degenerative disc disease is noted at C6-7. Moderate bilateral neural foraminal stenosis is noted at C6-7 secondary to uncovertebral spurring. No prevertebral soft tissue swelling is noted. IMPRESSION: Extensive postsurgical and degenerative changes as described above. No acute abnormality is noted. Electronically Signed  By: Marijo Conception M.D.   On: 07/09/2018 16:17    Procedures Procedures (including critical care time)  Medications Ordered in UC Medications - No data to display  Initial Impression / Assessment and Plan / UC Course  I have reviewed the triage vital signs and the nursing notes.  Pertinent labs & imaging results that were available during my care of the patient were reviewed by me and considered in my medical decision making (see chart for details).     78 year old female status post anterior cervical disc fusion C3-C5 presenting for increased pain and decreased range of motion after a popping sensation.  She is also noting right neck and upper trapezius tightness.  C-spine x-ray done in office, read by radiology: No acute abnormality noted, though patient does have extensive postsurgical and degenerative changes.  Patient has appointment with her orthopedic provider on Monday, intends to keep this appointment.  We will do tizanidine short-term to help alleviate pain second to muscle spasm and hypertonicity. Final Clinical Impressions(s) / UC Diagnoses   Final diagnoses:  Acute strain of neck muscle, initial encounter     Discharge Instructions     Take muscle relaxers only when needed - avoid  driving, alcohol. Keep appointment on Monday with your ortho provider.    ED Prescriptions    Medication Sig Dispense Auth. Provider   tiZANidine (ZANAFLEX) 4 MG tablet Take 1 tablet (4 mg total) by mouth every 6 (six) hours as needed for up to 5 days for muscle spasms. 20 tablet Hall-Potvin, [place], PA-C     Controlled Substance Prescriptions [place] Controlled Substance Registry consulted? Not Applicable   Quincy Sheehan, [place] 07/09/18 1948

## 2018-07-09 NOTE — ED Triage Notes (Signed)
Pt states she has neck pain. Pt states she has a metal plate in her neck. Pt states she went to look down and heard a loud pop.  Pt states she has pain radiating around to the back of her neck.

## 2018-07-09 NOTE — Telephone Encounter (Signed)
PT called to report that she has been doing a lot of reaching with spring cleaning over the past several days and she started having pain underneath her right collarbone like a spasm which now has radiated to her right ear down the right side of her neck. She said she heard a pop or crack when she was doing some reaching to clean. She says it is contant and hurts with movement. She has a history of cervical fusion with a metal plate and screws in 4665. She cannot get in with her orthopedic MD until Monday. She says her BP has been running 129/69 and she denies any SOB but she says she gets dizzy when she looks down and it makes her neck feel worse. She wanted to be sure she I didn't think she was having a heart attack. I advised her that if her symptoms change to please call us otherwise I urged her to talk with Dr. Quay Burow her PCP to be seen this week. Pt agrees and will forward to [name] for review.

## 2018-07-09 NOTE — Telephone Encounter (Signed)
Noted  

## 2018-07-10 NOTE — Telephone Encounter (Signed)
Would follow with orthopedic surgery as she is already doing.  If her symptoms of retrosternal tightness continues and are associated with shortness of breath she should call us back and we will arrange for a stress test or coronary CTA.

## 2018-07-10 NOTE — Telephone Encounter (Signed)
Pt states she went to Urgent Care and she has a strained muscle in her neck, with spasms, and they advised her how to treat this accordingly.  Pt states she had no sob, or any other cardiac related issues during the time of her neck injury or at this current time.  Pt states she will see her Orthopedic Sx Dr Lorin Mercy, next Monday for follow-up. Pt is aware if she has sob or any other cardiac related issues, she is to call the office back and let us know, for we may order for her to have a stress test done, or a coronary CT.  Pt verbalized understanding and agrees with this plan.  Pt was more than gracious for all the assistance provided and for the follow-up call.

## 2018-07-14 ENCOUNTER — Encounter: Payer: Self-pay | Admitting: Orthopedic Surgery

## 2018-07-14 ENCOUNTER — Ambulatory Visit (INDEPENDENT_AMBULATORY_CARE_PROVIDER_SITE_OTHER): Payer: Medicare Other | Admitting: Orthopedic Surgery

## 2018-07-14 ENCOUNTER — Other Ambulatory Visit: Payer: Self-pay

## 2018-07-14 VITALS — Ht 62.0 in | Wt 187.0 lb

## 2018-07-14 DIAGNOSIS — M542 Cervicalgia: Secondary | ICD-10-CM | POA: Diagnosis not present

## 2018-07-14 DIAGNOSIS — I2510 Atherosclerotic heart disease of native coronary artery without angina pectoris: Secondary | ICD-10-CM

## 2018-07-15 ENCOUNTER — Encounter: Payer: Self-pay | Admitting: Orthopedic Surgery

## 2018-07-15 NOTE — Progress Notes (Signed)
Office Visit Note   Patient: Samantha [name]           Date of Birth: Jul 19, 1940           MRN: [id number] Visit Date: 07/14/2018 Requested by: [name], MD [place],  [place] [id number] PCP: [name], MD  Subjective: Chief Complaint  Patient presents with  . Neck - Pain    HPI: Samantha [name] is a patient with neck pain.  She states that she "pulled a muscle in her neck last week".  Went to urgent care on [address].  She was overdoing it due to being on steroids.  She overused her right arm.  She had radiographs which are reviewed.  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  She was given tizanidine with some relief.  He feels good now it hurts very little.  She has been using Voltaren gel.  Her fusion was done in 2001.              ROS: All systems reviewed are negative as they relate to the chief complaint within the history of present illness.  Patient denies  fevers or chills.   Assessment & Plan: Visit Diagnoses:  1. Cervicalgia     Plan: Impression is right-sided neck pain with no real evidence of weakness today.  She does have some adjacent segment disease at C6-7 which is likely the cause of this.  I think MRI scanning would be the next step should her symptoms recur.  She will call me and let me know if that is the case.  Shoulder exam is pretty benign today.  Follow-up with me.  Follow-Up Instructions: Return if symptoms worsen or fail to improve.   Orders:  No orders of the defined types were placed in this encounter.  No orders of the defined types were placed in this encounter.     Procedures: No procedures performed   Clinical Data: No additional findings.  Objective: Vital Signs: Ht 5\' 2"  (1.575 m)   Wt 187 lb (84.8 kg)   BMI 34.20 kg/m   Physical Exam:   Constitutional: Patient appears well-developed HEENT:  Head: Normocephalic Eyes:EOM are normal Neck: Normal range  of motion Cardiovascular: Normal rate Pulmonary/chest: Effort normal Neurologic: Patient is alert Skin: Skin is warm Psychiatric: Patient has normal mood and affect    Ortho Exam: Ortho exam demonstrates predictably restricted cervical spine range of motion with about 20 degrees of flexion 25 degrees of extension rotation a little bit more to the right and about 45 in the left is about 30.  She has 5 out of 5 grip EPL FPL interosseous wrist flexion extension bicep triceps and deltoid strength.  Radial pulse intact bilaterally.  No masses lymphadenopathy or skin changes noted in that shoulder girdle or neck region.  No definite paresthesias C5-T1.  Reflexes symmetric bilateral biceps triceps.  Shoulder exam demonstrates good rotator cuff strength without coarse grinding or crepitus.  No discrete AC joint tenderness and negative impingement signs.  Specialty Comments:  No specialty comments available.  Imaging: No results found.   PMFS History: Patient Active Problem List   Diagnosis Date Noted  . Unilateral primary osteoarthritis, right knee 01/28/2018  . 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 01/28/2018  . Viral gastroenteritis 01/15/2018  . GERD (gastroesophageal reflux disease) 10/23/2017  . Sacral back pain 10/16/2017  . Chronic left upper quadrant  pain 07/23/2017  . Rectal bleeding 07/23/2017  . Poor balance 03/14/2017  . Right shoulder pain 03/14/2017  . Hair loss 10/19/2016  . Angina pectoris ([place]) 03/22/2016  . Hypertensive heart disease 07/07/2015  . Essential hypertension 07/07/2015  . Plantar fasciitis, left 03/22/2015  . Coronary artery disease due to lipid rich plaque 01/05/2015  . Chronic diastolic CHF (congestive heart failure), NYHA class 2 ([place]) 01/05/2015  . 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 Migraine (Ocular) 01/05/2014  . VBI (vertebrobasilar insufficiency)  08/22/2012  . TIA (transient ischemic attack) 06/27/2012  . DJD (degenerative joint disease) 02/02/2011  . 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 Postherpetic neuralgia ? 01/02/2010  . Palpitations 11/23/2008  . UTI'S, RECURRENT 09/28/2008  . Fibromyalgia 08/15/2007  . 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 Hyperlipidemia 05/21/2006  . Reactive airway disease 01/29/2002   Past Medical History:  Diagnosis Date  . Abnormal CT of the chest 2008   last CT4-l 2009:  . No f/u suggested   . Allergy   . Asthma   . CAD (coronary artery disease)    a. Coronary CTA 10/16: Coronary Ca score 211, mod non-obstructive CAD with LM mild plaque (25-50%), mid LAD 50-69%. b. Neg nuc 06/2015.  . Cataract    BILATERAL-REMOVED  . Chronic diastolic CHF (congestive heart failure) ([place])   . Collagen vascular disease ([place])    "arterial sclerosis" per pt  . Complication of anesthesia    trouble waking up  . Fibromyalgia   . GERD (gastroesophageal reflux disease)   . H/O hiatal hernia   . Heart murmur   . Hyperlipidemia   . Hypertension   . Malignant neoplasm of ascending colon ([place]) 2016   Minimally invasive right hemicolectomy to be done   . Neuromuscular disorder (HCC)    FIBROMYALGIA  . Ocular migraine   . OSA (obstructive sleep apnea) 09/2007   dx w/ a sleep study, not on  CPAP  . Osteoarthritis   . Osteoporosis   . Pneumonia    "double" in 2004  . PONV (postoperative nausea and vomiting)   . Reactive airway disease 01/29/2002   dx of pseudoasthma / vcd in 2005 and nl sprirometry History of dyspnea, 2011,  improved after several medications were changed around Question of COPD, disproved July 06, 2009 with nl pft's      . Rheumatoid factor positive   . Shingles 11/2009  . Sleep apnea   . Stroke ([place])   . TIA (transient ischemic attack)    x2 - on Plavix for this  . Torn rotator cuff    right worse than left, both are torn  . Tumor, thyroid    partial thyroidectomy  in the 60s  . Type II diabetes mellitus ([place])   . 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 Vaginal dysplasia     Family History  Problem Relation Age of Onset  . Heart disease Father   . Heart disease Mother   . Lung cancer Mother   . Allergies Sister   . Parkinsonism Sister        possible  . Asthma Sister   . Asthma Paternal Grandmother   . Stroke Paternal Grandmother   . Heart disease Other        paternal grandparents, maternal grandparents,   . Heart disease Brother   . Emphysema Brother   . Aneurysm Brother  x3  . Kidney failure Brother   . Diabetes Brother   . Diabetes Brother   . Stroke Brother   . Stroke Maternal Grandmother   . Breast cancer Neg Hx   . Colon cancer Neg Hx   . Heart attack Neg Hx     Past Surgical History:  Procedure Laterality Date  . ABDOMINAL HYSTERECTOMY  1980   NO oophorectomy per pt   . ANTERIOR CERVICAL DECOMP/DISCECTOMY FUSION  2001   C 3, C4 and C5 plate and screws  . BREAST BIOPSY Right 1999  . BUNIONECTOMY Left ~ 1977  . CARDIAC CATHETERIZATION     2018 By Dr. Pernell Dupre (done after colon surgery)  . CATARACT EXTRACTION W/ INTRAOCULAR LENS  IMPLANT, BILATERAL  2012  . COLON SURGERY  10/2014  . EYE SURGERY Bilateral    torq lens for cataracts  . LEFT HEART CATH AND CORONARY ANGIOGRAPHY N/A 03/22/2016   Procedure: Left Heart Cath and Coronary Angiography;  Surgeon: Belva Crome, MD;  Location: [facility];  Service: Cardiovascular;  Laterality: N/A;  . LUMBAR LAMINECTOMY/DECOMPRESSION MICRODISCECTOMY N/A 01/20/2018   Procedure: L4-5 decompression;  Surgeon: Marybelle Killings, MD;  Location: [facility];  Service: Orthopedics;  Laterality: N/A;  . THYROIDECTOMY, PARTIAL  1960's  . VAGINAL MASS EXCISION  1994   "Laser surgery for vaginal cancer; followed by chemotherapy" (06/27/2012)   Social History   Occupational History  . Occupation: Retired, disable since 2000    Employer: RETIRED  Tobacco Use  . Smoking status: Former Smoker     Packs/day: 0.25    Years: 5.00    Pack years: 1.25    Types: Cigarettes    Quit date: 01/29/1998    Years since quitting: 20.4  . Smokeless tobacco: Never Used  . Tobacco comment: Quit in 2001  Substance and Sexual Activity  . Alcohol use: No    Alcohol/week: 0.0 standard drinks  . Drug use: No  . Sexual activity: Never

## 2018-07-21 DIAGNOSIS — Z20828 Contact with and (suspected) exposure to other viral communicable diseases: Secondary | ICD-10-CM | POA: Diagnosis not present

## 2018-07-23 DIAGNOSIS — Z85038 Personal history of other malignant neoplasm of large intestine: Secondary | ICD-10-CM | POA: Diagnosis not present

## 2018-07-23 DIAGNOSIS — N958 Other specified menopausal and perimenopausal disorders: Secondary | ICD-10-CM | POA: Diagnosis not present

## 2018-07-23 DIAGNOSIS — Z801 Family history of malignant neoplasm of trachea, bronchus and lung: Secondary | ICD-10-CM | POA: Diagnosis not present

## 2018-07-23 DIAGNOSIS — Z124 Encounter for screening for malignant neoplasm of cervix: Secondary | ICD-10-CM | POA: Diagnosis not present

## 2018-07-23 DIAGNOSIS — Z01419 Encounter for gynecological examination (general) (routine) without abnormal findings: Secondary | ICD-10-CM | POA: Diagnosis not present

## 2018-07-23 DIAGNOSIS — Z1231 Encounter for screening mammogram for malignant neoplasm of breast: Secondary | ICD-10-CM | POA: Diagnosis not present

## 2018-07-23 DIAGNOSIS — Z6834 Body mass index (BMI) 34.0-34.9, adult: Secondary | ICD-10-CM | POA: Diagnosis not present

## 2018-07-23 DIAGNOSIS — Z86010 Personal history of colonic polyps: Secondary | ICD-10-CM | POA: Diagnosis not present

## 2018-07-23 DIAGNOSIS — K640 First degree hemorrhoids: Secondary | ICD-10-CM | POA: Diagnosis not present

## 2018-08-12 ENCOUNTER — Other Ambulatory Visit: Payer: Self-pay | Admitting: Internal Medicine

## 2018-08-20 NOTE — Progress Notes (Addendum)
Subjective:   Samantha [name] is a 78 y.o. female who presents for Medicare Annual (Subsequent) preventive examination. I connected with patient by a telephone and verified that I am speaking with the correct person using two identifiers. Patient stated full name and DOB. Patient gave permission to continue with telephonic visit. Patient's location was at home and Nurse's location was at [place] office.   Review of Systems:   Cardiac Risk Factors include: advanced age (>68men, >48 women);diabetes mellitus;dyslipidemia;hypertension Sleep patterns: feels rested on waking, gets up 1-2 times nightly to void and sleeps 7 hours nightly.    Home Safety/Smoke Alarms: Feels safe in home. Smoke alarms in place.  Living environment; residence and Firearm Safety: 1-story house/ trailer, equipment: Radio producer, Type: [place]. Lives with husband and daughter, no needs for DME, good support system Seat Belt Safety/Bike Helmet: Wears seat belt.      Objective:     Vitals: There were no vitals taken for this visit.  There is no height or weight on file to calculate BMI.  Advanced Directives 08/21/2018 01/20/2018 01/16/2018 08/13/2017 01/17/2016 11/11/2014 11/11/2014  Does Patient Have a Medical Advance Directive? Yes Yes Yes Yes Yes No -  Type of Paramedic of [place];Living will Living will - [place];Living will [place];Living will - -  Does patient want to make changes to medical advance directive? - No - Patient declined - - - - -  Copy of [place] in Chart? No - copy requested - - No - copy requested No - copy requested - -  Would patient like information on creating a medical advance directive? - - - - - - (No Data)  Pre-existing out of facility DNR order (yellow form or pink MOST form) - - - - - - -    Tobacco Social History   Tobacco Use  Smoking Status Former Smoker  . Packs/day: 0.25  . Years:  5.00  . Pack years: 1.25  . Types: Cigarettes  . Quit date: 01/29/1998  . Years since quitting: 20.5  Smokeless Tobacco Never Used  Tobacco Comment   Quit in 2001     Counseling given: Not Answered Comment: Quit in 2001  Past Medical History:  Diagnosis Date  . Abnormal CT of the chest 2008   last CT4-l 2009:  . No f/u suggested   . Allergy   . Asthma   . CAD (coronary artery disease)    a. Coronary CTA 10/16: Coronary Ca score 211, mod non-obstructive CAD with LM mild plaque (25-50%), mid LAD 50-69%. b. Neg nuc 06/2015.  . Cataract    BILATERAL-REMOVED  . Chronic diastolic CHF (congestive heart failure) ([place])   . Collagen vascular disease ([place])    "arterial sclerosis" per pt  . Complication of anesthesia    trouble waking up  . Fibromyalgia   . GERD (gastroesophageal reflux disease)   . H/O hiatal hernia   . Heart murmur   . Hyperlipidemia   . Hypertension   . 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 Neuromuscular disorder (HCC)    FIBROMYALGIA  . Ocular migraine   . OSA (obstructive sleep apnea) 09/2007   dx w/ a sleep study, not on  CPAP  . Osteoarthritis   . Osteoporosis   . Pneumonia    "double" in 2004  . PONV (postoperative nausea and vomiting)   .  Reactive airway disease 01/29/2002   dx of pseudoasthma / vcd in 2005 and nl sprirometry History of dyspnea, 2011,  improved after several medications were changed around Question of COPD, disproved July 06, 2009 with nl pft's      . Rheumatoid factor positive   . Shingles 11/2009  . Sleep apnea   . Stroke ([place])   . TIA (transient ischemic attack)    x2 - on Plavix for this  . Torn rotator cuff    right worse than left, both are torn  . Tumor, 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  . Type II diabetes mellitus ([place])   . 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 Vaginal dysplasia    Past Surgical History:  Procedure Laterality Date  . ABDOMINAL  HYSTERECTOMY  1980   NO oophorectomy per pt   . ANTERIOR CERVICAL DECOMP/DISCECTOMY FUSION  2001   C 3, C4 and C5 plate and screws  . BREAST BIOPSY Right 1999  . BUNIONECTOMY Left ~ 1977  . CARDIAC CATHETERIZATION     2018 By Dr. Pernell Dupre (done after colon surgery)  . CATARACT EXTRACTION W/ INTRAOCULAR LENS  IMPLANT, BILATERAL  2012  . COLON SURGERY  10/2014  . EYE SURGERY Bilateral    torq lens for cataracts  . LEFT HEART CATH AND CORONARY ANGIOGRAPHY N/A 03/22/2016   Procedure: Left Heart Cath and Coronary Angiography;  Surgeon: Belva Crome, MD;  Location: [facility];  Service: Cardiovascular;  Laterality: N/A;  . LUMBAR LAMINECTOMY/DECOMPRESSION MICRODISCECTOMY N/A 01/20/2018   Procedure: L4-5 decompression;  Surgeon: Marybelle Killings, MD;  Location: [facility];  Service: Orthopedics;  Laterality: N/A;  . THYROIDECTOMY, PARTIAL  1960's  . VAGINAL MASS EXCISION  1994   "Laser surgery for vaginal cancer; followed by chemotherapy" (06/27/2012)   Family History  Problem Relation Age of Onset  . Heart disease Father   . Heart disease Mother   . Lung cancer Mother   . Allergies Sister   . Parkinsonism Sister        possible  . Asthma Sister   . Asthma Paternal Grandmother   . Stroke Paternal Grandmother   . Heart disease Other        paternal grandparents, maternal grandparents,   . Heart disease Brother   . Emphysema Brother   . Aneurysm Brother        x3  . Kidney failure Brother   . Diabetes Brother   . Diabetes Brother   . Stroke Brother   . Stroke Maternal Grandmother   . Breast cancer Neg Hx   . Colon cancer Neg Hx   . Heart attack Neg Hx    Social History   Socioeconomic History  . Marital status: Married    Spouse name: Ilona Sorrel  . Number of children: 2  . Years of education: masters  . Highest education level: Not on file  Occupational History  . Occupation: Retired, disable since 2000    Employer: RETIRED  Social Needs  . Financial resource strain:  Not hard at all  . Food insecurity    Worry: Never true    Inability: Never true  . Transportation needs    Medical: No    Non-medical: No  Tobacco Use  . Smoking status: Former Smoker    Packs/day: 0.25    Years: 5.00    Pack years: 1.25    Types: Cigarettes    Quit date: 01/29/1998    Years  since quitting: 20.5  . Smokeless tobacco: Never Used  . Tobacco comment: Quit in 2001  Substance and Sexual Activity  . Alcohol use: No    Alcohol/week: 0.0 standard drinks  . Drug use: No  . Sexual activity: Never  Lifestyle  . Physical activity    Days per week: 5 days    Minutes per session: 30 min  . Stress: To some extent  Relationships  . Social connections    Talks on phone: More than three times a week    Gets together: More than three times a week    Attends religious service: More than 4 times per year    Active member of club or organization: Yes    Attends meetings of clubs or organizations: More than 4 times per year    Relationship status: Married  Other Topics Concern  . Not on file  Social History Narrative   On disability since 2000--- also husband has MS   Education. College.   Right handed.    Outpatient Encounter Medications as of 08/21/2018  Medication Sig  . acetaminophen (TYLENOL) 500 MG tablet Take 500 mg by mouth every 6 (six) hours as needed.  [name] albuterol (PROVENTIL HFA;VENTOLIN HFA) 108 (90 Base) MCG/ACT inhaler Inhale 2 puffs into the lungs every 6 (six) hours as needed for wheezing or shortness of breath.  . budesonide-formoterol (SYMBICORT) 80-4.5 MCG/ACT inhaler Inhale 2 puffs into the lungs 2 (two) times daily. (Patient taking differently: Inhale 2 puffs into the lungs 2 (two) times daily as needed (for shortness of breath or wheezing). )  . butalbital-acetaminophen-caffeine (FIORICET) 50-325-40 MG tablet Take 1 tablet by mouth every 6 (six) hours as needed for headache or migraine.  [name] BYSTOLIC 10 MG tablet TAKE 1 TABLET(10 MG) BY MOUTH DAILY  .  CALCIUM PO Take 1 tablet by mouth every morning.   . cetirizine (ZYRTEC) 10 MG tablet TAKE 1 TABLET(10 MG) BY MOUTH DAILY  . cholecalciferol (VITAMIN D) 1000 UNITS tablet Take 1,000 Units by mouth every morning.   . clopidogrel (PLAVIX) 75 MG tablet TAKE 1 TABLET(75 MG) BY MOUTH DAILY  . Evolocumab (REPATHA SURECLICK) 329 MG/ML SOAJ Inject 140 mg into the skin every 14 (fourteen) days.  . hydrochlorothiazide (HYDRODIURIL) 25 MG tablet Take 0.5 tablets (12.5 mg total) by mouth daily.  . hydrocortisone 2.5 % cream Apply topically 2 (two) times daily. (Patient taking differently: Apply 1 application topically 2 (two) times daily as needed (for rash). )  . hydroxychloroquine (PLAQUENIL) 200 MG tablet hydroxychloroquine 200 mg tablet  . isosorbide mononitrate (IMDUR) 60 MG 24 hr tablet Take 1 tablet (60 mg total) by mouth daily.  [name] lisinopril (PRINIVIL,ZESTRIL) 40 MG tablet Take 1 tablet (40 mg total) by mouth daily. (Patient taking differently: Take 20 mg by mouth daily. )  . metFORMIN (GLUCOPHAGE-XR) 500 MG 24 hr tablet TAKE 1 TABLET(500 MG) BY MOUTH DAILY WITH BREAKFAST  . Polyethyl Glycol-Propyl Glycol (SYSTANE) 0.4-0.3 % GEL Place 1 application into both eyes daily as needed (for dry eyes).   . Potassium 99 MG TABS Take 99 mg by mouth daily.   . pregabalin (LYRICA) 100 MG capsule TAKE 1 CAPSULE(100 MG) BY MOUTH DAILY AS NEEDED FOR PAIN  . promethazine (PHENERGAN) 25 MG tablet Take 1 tablet (25 mg total) by mouth every 8 (eight) hours as needed for nausea or vomiting.  [name] HYDROcodone-acetaminophen (NORCO/VICODIN) 5-325 MG tablet Take 1 tablet by mouth every 6 (six) hours as needed for moderate pain. (Patient  not taking: Reported on 08/21/2018)  . [DISCONTINUED] lisinopril (PRINIVIL,ZESTRIL) 20 MG tablet TAKE 1 TABLET(20 MG) BY MOUTH DAILY (Patient not taking: Reported on 08/21/2018)  . [DISCONTINUED] predniSONE (DELTASONE) 10 MG tablet TK 1 T PO QD   No facility-administered encounter medications on  file as of 08/21/2018.     Activities of Daily Living In your present state of health, do you have any difficulty performing the following activities: 08/21/2018 01/20/2018  Hearing? N N  Vision? N N  Difficulty concentrating or making decisions? N N  Walking or climbing stairs? N Y  Dressing or bathing? N N  Doing errands, shopping? N Y  Conservation officer, nature and eating ? N -  Using the Toilet? N -  In the past six months, have you accidently leaked urine? N -  Do you have problems with loss of bowel control? N -  Managing your Medications? N -  Managing your Finances? N -  Housekeeping or managing your Housekeeping? N -  Some recent data might be hidden    Patient Care Team: [name], MD as PCP - General (Internal Medicine) [name], MD as Consulting Physician (Ophthalmology) [name], MD as Consulting Physician (Cardiology) [name], MD as Consulting Physician (Obstetrics and Gynecology) [name], MD as Consulting Physician (General Surgery) Zadie Rhine Clent Demark, MD as Consulting Physician (Ophthalmology) Marybelle Killings, MD as Consulting Physician (Orthopedic Surgery)    Assessment:   This is a routine wellness examination for Risha. Physical assessment deferred to PCP.   Exercise Activities and Dietary recommendations Current Exercise Habits: Home exercise routine, Type of exercise: walking, Time (Minutes): 30, Frequency (Times/Week): 5, Weekly Exercise (Minutes/Week): 150, Intensity: Mild, Exercise limited by: orthopedic condition(s)  Diet (meal preparation, eat out, water intake, caffeinated beverages, dairy products, fruits and vegetables): in general, a "healthy" diet  , well balanced   Reviewed heart healthy and diabetic diet. Encouraged patient to increase daily water and healthy fluid intake.  Goals    . lose weight     Start to incorporate weights in my exercise routine and do my back and core exercises. Continue to eat healthy, enjoy  creating my art and work on my tooth Interior and spatial designer.     . Patient Stated     Take time for myself as much as I can. Go to a room and take deep breaths if I cannot get out of the house. When my daughter is home take time to leave the house to clear my head and meditate. Continue to work on getting all of the resources I can to help with respite care for my husband. I will set out 3-4 bottles of water and drink them daily. I will drink glucerna if I do not have time to eat or prepare a meal.         Fall Risk Fall Risk  08/21/2018 08/13/2017 10/19/2016 10/19/2015 09/17/2014  Falls in the past year? 1 No No No No  Number falls in past yr: 1 - - - -  Injury with Fall? 1 - - - -  Risk for fall due to : Impaired mobility;Impaired balance/gait - - - -  Follow up Falls prevention discussed;Education provided - - - -    Depression Screen PHQ 2/9 Scores 08/21/2018 08/13/2017 10/19/2016 10/19/2015  PHQ - 2 Score 1 2 0 0  PHQ- 9 Score 1 6 2  -     Cognitive Function MMSE - Mini Mental State Exam 08/13/2017 10/19/2016  Orientation  to time 5 5  Orientation to Place 5 5  Registration 3 3  Attention/ Calculation 5 5  Recall 1 2  Language- name 2 objects 2 2  Language- repeat 1 1  Language- follow 3 step command 3 3  Language- read & follow direction 1 1  Write a sentence 1 1  Copy design 1 1  Total score 28 29        Immunization History  Administered Date(s) Administered  . Influenza Split 11/30/2010  . Influenza Whole 10/20/2008, 01/02/2010  . Influenza, High Dose Seasonal PF 12/24/2012, 03/17/2015, 10/19/2015, 10/19/2016, 10/23/2017  . Influenza, Seasonal, Injecte, Preservative Fre 01/01/2012  . Influenza,inj,Quad PF,6+ Mos 11/13/2013  . Pneumococcal Conjugate-13 05/19/2014  . Pneumococcal Polysaccharide-23 01/29/2002, 11/23/2008  . Td 01/29/2001  . Tdap 08/14/2011  . Zoster 12/24/2012   Screening Tests Health Maintenance  Topic Date Due  . OPHTHALMOLOGY EXAM  03/13/2017  .  INFLUENZA VACCINE  08/30/2018  . HEMOGLOBIN A1C  11/04/2018  . COLONOSCOPY  02/01/2019  . FOOT EXAM  05/05/2019  . DEXA SCAN  06/11/2021  . TETANUS/TDAP  08/13/2021  . PNA vac Low Risk Adult  Completed      Plan:    Reviewed health maintenance screenings with patient today and relevant education, vaccines, and/or referrals were provided.   Continue to eat heart healthy diet (full of fruits, vegetables, whole grains, lean protein, water--limit salt, fat, and sugar intake) and increase physical activity as tolerated.  Continue doing brain stimulating activities (puzzles, reading, adult coloring books, staying active) to keep memory sharp.   I have personally reviewed and noted the following in the patient's chart:   . Medical and social history . Use of alcohol, tobacco or illicit drugs  . Current medications and supplements . Functional ability and status . Nutritional status . Physical activity . Advanced directives . List of other physicians . Screenings to include cognitive, depression, and falls . Referrals and appointments  In addition, I have reviewed and discussed with patient certain preventive protocols, quality metrics, and best practice recommendations. A written personalized care plan for preventive services as well as general preventive health recommendations were provided to patient.     Michiel Cowboy, RN  08/21/2018   Medical screening examination/treatment/procedure(s) were performed by non-physician practitioner and as supervising physician I was immediately available for consultation/collaboration. I agree with above. [name], MD

## 2018-08-21 ENCOUNTER — Ambulatory Visit (INDEPENDENT_AMBULATORY_CARE_PROVIDER_SITE_OTHER): Payer: Medicare Other | Admitting: *Deleted

## 2018-08-21 DIAGNOSIS — Z0000 Encounter for general adult medical examination without abnormal findings: Secondary | ICD-10-CM | POA: Diagnosis not present

## 2018-08-28 DIAGNOSIS — H43812 Vitreous degeneration, left eye: Secondary | ICD-10-CM | POA: Diagnosis not present

## 2018-08-28 DIAGNOSIS — G43109 Migraine with aura, not intractable, without status migrainosus: Secondary | ICD-10-CM | POA: Diagnosis not present

## 2018-08-28 DIAGNOSIS — E119 Type 2 diabetes mellitus without complications: Secondary | ICD-10-CM | POA: Diagnosis not present

## 2018-08-28 DIAGNOSIS — H43811 Vitreous degeneration, right eye: Secondary | ICD-10-CM | POA: Diagnosis not present

## 2018-08-28 LAB — HM DIABETES EYE EXAM

## 2018-08-29 ENCOUNTER — Telehealth: Payer: Self-pay

## 2018-08-29 NOTE — Telephone Encounter (Signed)

## 2018-08-29 NOTE — Telephone Encounter (Signed)
New Message ° ° °Patient returning your call. °

## 2018-08-29 NOTE — Telephone Encounter (Signed)
lmom for prescreen  

## 2018-09-01 ENCOUNTER — Telehealth: Payer: Self-pay | Admitting: Pharmacist

## 2018-09-01 ENCOUNTER — Ambulatory Visit: Payer: Medicare Other

## 2018-09-01 NOTE — Telephone Encounter (Signed)
Patient scheduled to see [place] in lipid clinic this AM. Appointment made back in May. Since then patient has started on Repatha. Patient states she is tolerating well and does not have any concerns. Patient already has lab work scheduled for the beginning of sept to check lipids. Discussed with patient. Will cancel today's appointment. Will call patient to review labs in september.

## 2018-10-01 ENCOUNTER — Other Ambulatory Visit: Payer: Self-pay

## 2018-10-01 ENCOUNTER — Ambulatory Visit: Payer: Medicare Other | Admitting: Orthopaedic Surgery

## 2018-10-01 ENCOUNTER — Other Ambulatory Visit: Payer: Medicare Other | Admitting: *Deleted

## 2018-10-01 DIAGNOSIS — E782 Mixed hyperlipidemia: Secondary | ICD-10-CM

## 2018-10-01 DIAGNOSIS — I2510 Atherosclerotic heart disease of native coronary artery without angina pectoris: Secondary | ICD-10-CM | POA: Diagnosis not present

## 2018-10-01 LAB — COMPREHENSIVE METABOLIC PANEL
ALT: 13 IU/L (ref 0–32)
AST: 16 IU/L (ref 0–40)
Albumin/Globulin Ratio: 1.8 (ref 1.2–2.2)
Albumin: 4.2 g/dL (ref 3.7–4.7)
Alkaline Phosphatase: 53 IU/L (ref 39–117)
BUN/Creatinine Ratio: 19 (ref 12–28)
BUN: 16 mg/dL (ref 8–27)
Bilirubin Total: 0.3 mg/dL (ref 0.0–1.2)
CO2: 24 mmol/L (ref 20–29)
Calcium: 9 mg/dL (ref 8.7–10.3)
Chloride: 102 mmol/L (ref 96–106)
Creatinine, Ser: 0.86 mg/dL (ref 0.57–1.00)
GFR calc Af Amer: 76 mL/min/{1.73_m2} (ref >59)
GFR calc non Af Amer: 66 mL/min/{1.73_m2} (ref >59)
Globulin, Total: 2.3 g/dL (ref 1.5–4.5)
Glucose: 113 mg/dL — ABNORMAL HIGH (ref 65–99)
Potassium: 4.5 mmol/L (ref 3.5–5.2)
Sodium: 139 mmol/L (ref 134–144)
Total Protein: 6.5 g/dL (ref 6.0–8.5)

## 2018-10-01 LAB — CBC WITH DIFFERENTIAL/PLATELET
Basophils Absolute: 0 10*3/uL (ref 0.0–0.2)
Basos: 1 %
EOS (ABSOLUTE): 0.1 10*3/uL (ref 0.0–0.4)
Eos: 4 %
Hematocrit: 37.2 % (ref 34.0–46.6)
Hemoglobin: 12.5 g/dL (ref 11.1–15.9)
Immature Grans (Abs): 0 10*3/uL (ref 0.0–0.1)
Immature Granulocytes: 0 %
Lymphocytes Absolute: 1.7 10*3/uL (ref 0.7–3.1)
Lymphs: 46 %
MCH: 28.8 pg (ref 26.6–33.0)
MCHC: 33.6 g/dL (ref 31.5–35.7)
MCV: 86 fL (ref 79–97)
Monocytes Absolute: 0.4 10*3/uL (ref 0.1–0.9)
Monocytes: 10 %
Neutrophils Absolute: 1.5 10*3/uL (ref 1.4–7.0)
Neutrophils: 39 %
Platelets: 158 10*3/uL (ref 150–450)
RBC: 4.34 x10E6/uL (ref 3.77–5.28)
RDW: 14.7 % (ref 11.7–15.4)
WBC: 3.8 10*3/uL (ref 3.4–10.8)

## 2018-10-01 LAB — LIPID PANEL
Chol/HDL Ratio: 1.6 ratio (ref 0.0–4.4)
Cholesterol, Total: 135 mg/dL (ref 100–199)
HDL: 84 mg/dL (ref >39)
LDL Chol Calc (NIH): 34 mg/dL (ref 0–99)
Triglycerides: 93 mg/dL (ref 0–149)
VLDL Cholesterol Cal: 17 mg/dL (ref 5–40)

## 2018-10-01 LAB — TSH: TSH: 2.98 u[IU]/mL (ref 0.450–4.500)

## 2018-10-02 DIAGNOSIS — E119 Type 2 diabetes mellitus without complications: Secondary | ICD-10-CM | POA: Diagnosis not present

## 2018-10-02 DIAGNOSIS — M069 Rheumatoid arthritis, unspecified: Secondary | ICD-10-CM | POA: Diagnosis not present

## 2018-10-02 DIAGNOSIS — Z8739 Personal history of other diseases of the musculoskeletal system and connective tissue: Secondary | ICD-10-CM | POA: Diagnosis not present

## 2018-10-02 DIAGNOSIS — M797 Fibromyalgia: Secondary | ICD-10-CM | POA: Diagnosis not present

## 2018-10-02 DIAGNOSIS — I10 Essential (primary) hypertension: Secondary | ICD-10-CM | POA: Diagnosis not present

## 2018-10-02 DIAGNOSIS — M1990 Unspecified osteoarthritis, unspecified site: Secondary | ICD-10-CM | POA: Diagnosis not present

## 2018-10-02 DIAGNOSIS — E785 Hyperlipidemia, unspecified: Secondary | ICD-10-CM | POA: Diagnosis not present

## 2018-10-07 ENCOUNTER — Encounter: Payer: Self-pay | Admitting: Orthopaedic Surgery

## 2018-10-07 ENCOUNTER — Ambulatory Visit (INDEPENDENT_AMBULATORY_CARE_PROVIDER_SITE_OTHER): Payer: Medicare Other | Admitting: Orthopaedic Surgery

## 2018-10-07 VITALS — BP 128/76 | HR 68 | Ht 62.0 in | Wt 191.0 lb

## 2018-10-07 DIAGNOSIS — M1711 Unilateral primary osteoarthritis, right knee: Secondary | ICD-10-CM | POA: Diagnosis not present

## 2018-10-07 DIAGNOSIS — I2510 Atherosclerotic heart disease of native coronary artery without angina pectoris: Secondary | ICD-10-CM | POA: Diagnosis not present

## 2018-10-07 DIAGNOSIS — Z9889 Other specified postprocedural states: Secondary | ICD-10-CM | POA: Diagnosis not present

## 2018-10-07 NOTE — Progress Notes (Signed)
Office Visit Note   Patient: Samantha [name]           Date of Birth: Jun 29, 1940           MRN: [id number] Visit Date: 10/07/2018              Requested by: [name], MD [place],  [place] [id number] PCP: [name], MD   Assessment & Plan: Visit Diagnoses:  1. Unilateral primary osteoarthritis, right knee   2. 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     Plan: We offered her the sacroiliac joint on the right side.  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  We will check her back again in 3 months we can discuss right total knee arthroplasty versus reconsider possible SI joint injection on the right if she still having problems.  Follow-Up Instructions: Return in about 2 months (around 12/07/2018).   Orders:  No orders of the defined types were placed in this encounter.  No orders of the defined types were placed in this encounter.     Procedures: No procedures performed   Clinical Data: No additional findings.   Subjective: Chief Complaint  Patient presents with  . Lower Back - Follow-up    01/20/18 L4-5 Decompression  . Right Knee - Follow-up    HPI 78 year old female returns with ongoing problems with her right knee with valgus osteoarthritis.  She relates she was also diagnosed with rheumatoid arthritis, no rheumatoid factor in chart review.  Previous decompression 01/20/2018 L4-5 level doing well she has had some occasional intermittent sharp pain over the right sacroiliac joint that tends to come and go.  Right knee is painful but she is wanting to wait a few months before proceeding with right total knee arthroplasty.  She also sometimes has some pain in the thoracic region.  This is intermittent and not as severe.  Review of Systems 14 point system update unchanged from 07/02/2018 office visit other than as mentioned in HPI.   Objective: Vital Signs: BP 128/76    Pulse 68   Ht 5\' 2"  (1.575 m)   Wt 191 lb (86.6 kg)   BMI 34.93 kg/m   Physical Exam Constitutional:      Appearance: She is well-developed.  HENT:     Head: Normocephalic.     Right Ear: External ear normal.     Left Ear: External ear normal.  Eyes:     Pupils: Pupils are equal, round, and reactive to light.  Neck:     Thyroid: No thyromegaly.     Trachea: No tracheal deviation.  Cardiovascular:     Rate and Rhythm: Normal rate.  Pulmonary:     Effort: Pulmonary effort is normal.  Abdominal:     Palpations: Abdomen is soft.  Skin:    General: Skin is warm and dry.  Neurological:     Mental Status: She is alert and oriented to person, place, and time.  Psychiatric:        Behavior: Behavior normal.     Ortho Exam well-healed lumbar incision at L4-5 level.  Right SI joint is tender negative logroll of the hips negative Corky Sox.  Lower extremity reflexes are 2+.  Patient has valgus right knee with palpable lateral osteophytes tenderness 2+ knee effusion.  Distal pulses palpable.  Specialty Comments:  No specialty comments available.  Imaging: No results found.   PMFS History: Patient Active Problem  List   Diagnosis Date Noted  . Unilateral primary osteoarthritis, right knee 01/28/2018  . 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 01/28/2018  . Viral gastroenteritis 01/15/2018  . GERD (gastroesophageal reflux disease) 10/23/2017  . Sacral back pain 10/16/2017  . Chronic left upper quadrant pain 07/23/2017  . Rectal bleeding 07/23/2017  . Poor balance 03/14/2017  . Right shoulder pain 03/14/2017  . Hair loss 10/19/2016  . Angina pectoris ([place]) 03/22/2016  . Hypertensive heart disease 07/07/2015  . Essential hypertension 07/07/2015  . Plantar fasciitis, left 03/22/2015  . Coronary artery disease due to lipid rich plaque 01/05/2015  . Chronic diastolic CHF (congestive heart failure), NYHA class 2 ([place]) 01/05/2015  . Malignant  neoplasm of ascending colon  pT1, pN0, rM0 s/p robotic colectomy 11/11/2014 11/11/2014  . Migraine (Ocular) 01/05/2014  . VBI (vertebrobasilar insufficiency) 08/22/2012  . TIA (transient ischemic attack) 06/27/2012  . DJD (degenerative joint disease) 02/02/2011  . 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 Postherpetic neuralgia ? 01/02/2010  . Palpitations 11/23/2008  . UTI'S, RECURRENT 09/28/2008  . Fibromyalgia 08/15/2007  . 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 Hyperlipidemia 05/21/2006  . Reactive airway disease 01/29/2002   Past Medical History:  Diagnosis Date  . Abnormal CT of the chest 2008   last CT4-l 2009:  . No f/u suggested   . Allergy   . Asthma   . CAD (coronary artery disease)    a. Coronary CTA 10/16: Coronary Ca score 211, mod non-obstructive CAD with LM mild plaque (25-50%), mid LAD 50-69%. b. Neg nuc 06/2015.  . Cataract    BILATERAL-REMOVED  . Chronic diastolic CHF (congestive heart failure) ([place])   . Collagen vascular disease ([place])    "arterial sclerosis" per pt  . Complication of anesthesia    trouble waking up  . Fibromyalgia   . GERD (gastroesophageal reflux disease)   . H/O hiatal hernia   . Heart murmur   . Hyperlipidemia   . Hypertension   . Malignant neoplasm of ascending colon ([place]) 2016   Minimally invasive right hemicolectomy to be done   . Neuromuscular disorder ([facility])    FIBROMYALGIA  . Ocular migraine   . OSA (obstructive sleep apnea) 09/2007   dx w/ a sleep study, not on  CPAP  . Osteoarthritis   . Osteoporosis   . Pneumonia    "double" in 2004  . PONV (postoperative nausea and vomiting)   . Reactive airway disease 01/29/2002   dx of pseudoasthma / vcd in 2005 and nl sprirometry History of dyspnea, 2011,  improved after several medications were changed around Question of COPD, disproved July 06, 2009 with nl pft's      . Rheumatoid factor positive   . Shingles 11/2009  . Sleep apnea   . Stroke ([place])   . TIA  (transient ischemic attack)    x2 - on Plavix for this  . Torn rotator cuff    right worse than left, both are torn  . Tumor, 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  . Type II diabetes mellitus ([place])   . 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 Vaginal dysplasia     Family History  Problem Relation Age of Onset  . Heart disease Father   . Heart disease Mother   . Lung cancer Mother   . Allergies Sister   . Parkinsonism Sister        possible  .  Asthma Sister   . Asthma Paternal Grandmother   . Stroke Paternal Grandmother   . Heart disease Other        paternal grandparents, maternal grandparents,   . Heart disease Brother   . Emphysema Brother   . Aneurysm Brother        x3  . Kidney failure Brother   . Diabetes Brother   . Diabetes Brother   . Stroke Brother   . Stroke Maternal Grandmother   . Breast cancer Neg Hx   . Colon cancer Neg Hx   . Heart attack Neg Hx     Past Surgical History:  Procedure Laterality Date  . ABDOMINAL HYSTERECTOMY  1980   NO oophorectomy per pt   . ANTERIOR CERVICAL DECOMP/DISCECTOMY FUSION  2001   C 3, C4 and C5 plate and screws  . BREAST BIOPSY Right 1999  . BUNIONECTOMY Left ~ 1977  . CARDIAC CATHETERIZATION     2018 By Dr. Pernell Dupre (done after colon surgery)  . CATARACT EXTRACTION W/ INTRAOCULAR LENS  IMPLANT, BILATERAL  2012  . COLON SURGERY  10/2014  . EYE SURGERY Bilateral    torq lens for cataracts  . LEFT HEART CATH AND CORONARY ANGIOGRAPHY N/A 03/22/2016   Procedure: Left Heart Cath and Coronary Angiography;  Surgeon: Belva Crome, MD;  Location: [facility];  Service: Cardiovascular;  Laterality: N/A;  . LUMBAR LAMINECTOMY/DECOMPRESSION MICRODISCECTOMY N/A 01/20/2018   Procedure: L4-5 decompression;  Surgeon: Marybelle Killings, MD;  Location: [facility];  Service: Orthopedics;  Laterality: N/A;  . THYROIDECTOMY, PARTIAL  1960's  . VAGINAL MASS EXCISION  1994   "Laser surgery for vaginal cancer; followed by chemotherapy"  (06/27/2012)   Social History   Occupational History  . Occupation: Retired, disable since 2000    Employer: RETIRED  Tobacco Use  . Smoking status: Former Smoker    Packs/day: 0.25    Years: 5.00    Pack years: 1.25    Types: Cigarettes    Quit date: 01/29/1998    Years since quitting: 20.7  . Smokeless tobacco: Never Used  . Tobacco comment: Quit in 2001  Substance and Sexual Activity  . Alcohol use: No    Alcohol/week: 0.0 standard drinks  . Drug use: No  . Sexual activity: Never

## 2018-10-08 ENCOUNTER — Telehealth: Payer: Self-pay | Admitting: *Deleted

## 2018-10-08 NOTE — Telephone Encounter (Signed)
Virtual Visit Pre-Appointment Phone Call  "(Name), I am calling you today to discuss your upcoming appointment. We are currently trying to limit exposure to the virus that causes COVID-19 by seeing patients at home rather than in the office."  1. "What is the BEST phone number to call the day of the visit?" - include this in appointment notes-YES  2. "Do you have or have access to (through a family member/friend) a smartphone with video capability that we can use for your visit?"  a. If no - list the appointment type as a PHONE visit in appointment notes-YES UPDATED  3. Confirm consent - "In the setting of the current Covid19 crisis, you are scheduled for a (phone ) visit with Dr.Nelson on 9/18 at 1000.  Just as we do with many in-office visits, in order for you to participate in this visit, we must obtain consent.  If you'd like, I can send this to your mychart (if signed up) or email for you to review.  Otherwise, I can obtain your verbal consent now.  All virtual visits are billed to your insurance company just like a normal visit would be.  By agreeing to a virtual visit, we'd like you to understand that the technology does not allow for your provider to perform an examination, and thus may limit your provider's ability to fully assess your condition. If your provider identifies any concerns that need to be evaluated in person, we will make arrangements to do so.  Finally, though the technology is pretty good, we cannot assure that it will always work on either your or our end, and in the setting of a video visit, we may have to convert it to a phone-only visit.  In either situation, we cannot ensure that we have a secure connection.  Are you willing to proceed?" STAFF: Did the patient verbally acknowledge consent to telehealth visit? Document YES/NO here: YES PT GAVE VERBAL CONSENT TO TREAT OVER THE PHONE FOR TELEPHONE VISIT ON 9/18 WITH DR NELSON  4. Advise patient to be prepared - "Two hours  prior to your appointment, go ahead and check your blood pressure, pulse, oxygen saturation, and your weight (if you have the equipment to check those) and write them all down. When your visit starts, your provider will ask you for this information. If you have an Apple Watch or Kardia device, please plan to have heart rate information ready on the day of your appointment. Please have a pen and paper handy nearby the day of the visit as well."-YES PT AWARE    5. Inform patient they will receive a phone call 15 minutes prior to their appointment time (may be from unknown caller ID) so they should be prepared to [place] has been deemed a candidate for a follow-up tele-health visit to limit community exposure during the Covid-19 pandemic. I spoke with the patient via phone to ensure availability of phone/video source, confirm preferred email & phone number, and discuss instructions and expectations.  I reminded Samantha [name] to be prepared with any vital sign and/or heart rhythm information that could potentially be obtained via home monitoring, at the time of her visit. I reminded Samantha [name] to expect a phone call prior to her visit.  [name], LPN [id number] PM        FULL LENGTH CONSENT FOR TELE-HEALTH VISIT   I hereby voluntarily request, consent and authorize [facility] and its employed or contracted physicians, Engineer, materials, nurse practitioners or other licensed health care professionals (the Practitioner), to provide me with telemedicine health care services (the "Services") as deemed necessary by the treating Practitioner. I acknowledge and consent to receive the Services by the Practitioner via telemedicine. I understand that the telemedicine visit will involve communicating with the Practitioner through live audiovisual communication technology and the disclosure of certain medical information by  electronic transmission. I acknowledge that I have been given the opportunity to request an in-person assessment or other available alternative prior to the telemedicine visit and am voluntarily participating in the telemedicine visit.  I understand that I have the right to withhold or withdraw my consent to the use of telemedicine in the course of my care at any time, without affecting my right to future care or treatment, and that the Practitioner or I may terminate the telemedicine visit at any time. I understand that I have the right to inspect all information obtained and/or recorded in the course of the telemedicine visit and may receive copies of available information for a reasonable fee.  I understand that some of the potential risks of receiving the Services via telemedicine include:  [name] Delay or interruption in medical evaluation due to technological equipment failure or disruption; . Information transmitted may not be sufficient (e.g. poor resolution of images) to allow for appropriate medical decision making by the Practitioner; and/or  . In rare instances, security protocols could fail, causing a breach of personal health information.  Furthermore, I acknowledge that it is my responsibility to provide information about my medical history, conditions and care that is complete and accurate to the best of my ability. I acknowledge that Practitioner's advice, recommendations, and/or decision may be based on factors not within their control, such as incomplete or inaccurate data provided by me or distortions of diagnostic images or specimens that may result from electronic transmissions. I understand that the practice of medicine is not an exact science and that Practitioner makes no warranties or guarantees regarding treatment outcomes. I acknowledge that I will receive a copy of this consent concurrently upon execution via email to the email address I last provided but may also request a printed  copy by calling the office of [place].    I understand that my insurance will be billed for this visit.   I have read or had this consent read to me. . I understand the contents of this consent, which adequately explains the benefits and risks of the Services being provided via telemedicine.  . I have been provided ample opportunity to ask questions regarding this consent and the Services and have had my questions answered to my satisfaction. . I give my informed consent for the services to be provided through the use of telemedicine in my medical care  By participating in this telemedicine visit I agree to the above. PT GAVE VERBAL CONSENT FOR DR NELSON TO TREAT HER VIA TELEPHONE VISIT ON 9/18-PT STATES SHE WOULD RATHER HAVE A TELEPHONE VISIT RIGHT NOW FOR HER HUSBAND IS ILL AND SHE NEEDS TO BE WITH HIM AT ALL TIMES.

## 2018-10-11 ENCOUNTER — Other Ambulatory Visit: Payer: Self-pay | Admitting: Cardiology

## 2018-10-11 ENCOUNTER — Other Ambulatory Visit: Payer: Self-pay | Admitting: Internal Medicine

## 2018-10-15 NOTE — Progress Notes (Signed)
Virtual Visit via Video Note   This visit type was conducted due to national recommendations for restrictions regarding the COVID-19 Pandemic (e.g. social distancing) in an effort to limit this patient's exposure and mitigate transmission in our community.  Due to her co-morbid illnesses, this patient is at least at moderate risk for complications without adequate follow up.  This format is felt to be most appropriate for this patient at this time.  All issues noted in this document were discussed and addressed.  A limited physical exam was performed with this format.  Please refer to the patient's chart for her consent to telehealth for [facility].   Date:  10/17/2018   ID:  Samantha [name], DOB October 12, 1940, MRN [id number]  Patient Location: Home Provider Location: Home  PCP:  [name], MD  Cardiologist: Dr Meda Coffee  Evaluation Performed:  Follow-Up Visit  Chief Complaint/reason for visit:  6 months follow up  History of Present Illness:    Samantha [name] is a 78 y.o. female with history of CAD (moderate nonobstructive disease by cardiac CT 10/2014), TIA (on Plavix), chronic diastolic CHF, DM2, HTN, HL, fibromyalgia, palpitations secondary to symptomatic PVCs, LE edema, colon cancer (s/p lap partial colectomy 10/2014), reactive airway disease who presents to clinic for evaluation of chest pressure. Cardiac catheterization performed in February 2018 showed 40% mid RCA stenosis and 50% mid LAD stenosis. The patient continued to have intermittent exertional and resting chest pain that has resolved after she was started on Imdur 30 mg daily.  04/10/18 - 3 months follow up, the patient underwent back surgery and has been feeling great, she is experiencing much less pain and is able to perform limited exercises. Denies chest pain, SOB, no LE edema, tolerates medications well. She is struggling to take care of her husband who has progressively worsening symptoms of MS and unable to  perform any ADL  10/17/2018 - 6 months follow up, she is doing well, she was started on Plaquenil for RA and it makes her feel tired. She denies chest pain, SOB, no LE edema, orthopnea, PND, she tolerates her meds well.  The patient does not have symptoms concerning for COVID-19 infection (fever, chills, cough, or new shortness of breath).   Past Medical History:  Diagnosis Date  . Abnormal CT of the chest 2008   last CT4-l 2009:  . No f/u suggested   . Allergy   . Asthma   . CAD (coronary artery disease)    a. Coronary CTA 10/16: Coronary Ca score 211, mod non-obstructive CAD with LM mild plaque (25-50%), mid LAD 50-69%. b. Neg nuc 06/2015.  . Cataract    BILATERAL-REMOVED  . Chronic diastolic CHF (congestive heart failure) ([place])   . Collagen vascular disease ([place])    "arterial sclerosis" per pt  . Complication of anesthesia    trouble waking up  . Fibromyalgia   . GERD (gastroesophageal reflux disease)   . H/O hiatal hernia   . Heart murmur   . Hyperlipidemia   . Hypertension   . Malignant neoplasm of ascending colon ([place]) 2016   Minimally invasive right hemicolectomy to be done   . Neuromuscular disorder (HCC)    FIBROMYALGIA  . Ocular migraine   . OSA (obstructive sleep apnea) 09/2007   dx w/ a sleep study, not on  CPAP  . Osteoarthritis   . Osteoporosis   . Pneumonia    "double" in 2004  . PONV (postoperative nausea and vomiting)   .  Reactive airway disease 01/29/2002   dx of pseudoasthma / vcd in 2005 and nl sprirometry History of dyspnea, 2011,  improved after several medications were changed around Question of COPD, disproved July 06, 2009 with nl pft's      . Rheumatoid factor positive   . Shingles 11/2009  . Sleep apnea   . Stroke ([place])   . TIA (transient ischemic attack)    x2 - on Plavix for this  . Torn rotator cuff    right worse than left, both are torn  . Tumor, 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  . Type II diabetes mellitus ([place])   . 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 Vaginal dysplasia    Past Surgical History:  Procedure Laterality Date  . ABDOMINAL HYSTERECTOMY  1980   NO oophorectomy per pt   . ANTERIOR CERVICAL DECOMP/DISCECTOMY FUSION  2001   C 3, C4 and C5 plate and screws  . BREAST BIOPSY Right 1999  . BUNIONECTOMY Left ~ 1977  . CARDIAC CATHETERIZATION     2018 By Dr. Pernell Dupre (done after colon surgery)  . CATARACT EXTRACTION W/ INTRAOCULAR LENS  IMPLANT, BILATERAL  2012  . COLON SURGERY  10/2014  . EYE SURGERY Bilateral    torq lens for cataracts  . LEFT HEART CATH AND CORONARY ANGIOGRAPHY N/A 03/22/2016   Procedure: Left Heart Cath and Coronary Angiography;  Surgeon: Belva Crome, MD;  Location: [facility];  Service: Cardiovascular;  Laterality: N/A;  . LUMBAR LAMINECTOMY/DECOMPRESSION MICRODISCECTOMY N/A 01/20/2018   Procedure: L4-5 decompression;  Surgeon: Marybelle Killings, MD;  Location: [facility];  Service: Orthopedics;  Laterality: N/A;  . THYROIDECTOMY, PARTIAL  1960's  . VAGINAL MASS EXCISION  1994   "Laser surgery for vaginal cancer; followed by chemotherapy" (06/27/2012)     Current Meds  Medication Sig  . butalbital-acetaminophen-caffeine (FIORICET) 50-325-40 MG tablet Take 1 tablet by mouth every 6 (six) hours as needed for headache or migraine.  [name] BYSTOLIC 10 MG tablet TAKE 1 TABLET(10 MG) BY MOUTH DAILY  . CALCIUM PO Take 1 tablet by mouth every morning.   . cetirizine (ZYRTEC) 10 MG tablet TAKE 1 TABLET(10 MG) BY MOUTH DAILY  . cholecalciferol (VITAMIN D) 1000 UNITS tablet Take 1,000 Units by mouth every morning.   . clopidogrel (PLAVIX) 75 MG tablet TAKE 1 TABLET(75 MG) BY MOUTH DAILY  . Evolocumab (REPATHA SURECLICK) [id number] MG/ML SOAJ Inject 140 mg into the skin every 14 (fourteen) days.  . hydrochlorothiazide (HYDRODIURIL) 25 MG tablet Take 0.5 tablets (12.5 mg total) by mouth daily.  [name] HYDROcodone-acetaminophen (NORCO/VICODIN) 5-325 MG tablet Take 1 tablet by mouth every 6 (six) hours as needed  for moderate pain.  . hydroxychloroquine (PLAQUENIL) 200 MG tablet hydroxychloroquine 200 mg tablet  . isosorbide mononitrate (IMDUR) 60 MG 24 hr tablet Take 1 tablet (60 mg total) by mouth daily.  [name] lisinopril (PRINIVIL,ZESTRIL) 40 MG tablet Take 1 tablet (40 mg total) by mouth daily. (Patient taking differently: Take 20 mg by mouth daily. )  . metFORMIN (GLUCOPHAGE-XR) 500 MG 24 hr tablet TAKE 1 TABLET(500 MG) BY MOUTH DAILY WITH BREAKFAST  . Polyethyl Glycol-Propyl Glycol (SYSTANE) 0.4-0.3 % GEL Place 1 application into both eyes daily as needed (for dry eyes).   . Potassium 99 MG TABS Take 99 mg by mouth daily.   . pregabalin (LYRICA) 100 MG capsule TAKE 1 CAPSULE(100 MG) BY MOUTH DAILY AS NEEDED FOR PAIN  . promethazine (PHENERGAN)  25 MG tablet Take 1 tablet (25 mg total) by mouth every 8 (eight) hours as needed for nausea or vomiting.     Allergies:   Bactrim [sulfamethoxazole-trimethoprim], Cefuroxime axetil, Oxycodone, Pravastatin, Seldane [terfenadine], Zocor [simvastatin], Tramadol, Atorvastatin, Lime flavor [flavoring agent], Rosuvastatin, and Tape   Social History   Tobacco Use  . Smoking status: Former Smoker    Packs/day: 0.25    Years: 5.00    Pack years: 1.25    Types: Cigarettes    Quit date: 01/29/1998    Years since quitting: 20.7  . Smokeless tobacco: Never Used  . Tobacco comment: Quit in 2001  Substance Use Topics  . Alcohol use: No    Alcohol/week: 0.0 standard drinks  . Drug use: No     Family Hx: The patient's family history includes Allergies in her sister; Aneurysm in her brother; Asthma in her paternal grandmother and sister; Diabetes in her brother and brother; Emphysema in her brother; Heart disease in her brother, father, mother, and another family member; Kidney failure in her brother; Lung cancer in her mother; Parkinsonism in her sister; Stroke in her brother, maternal grandmother, and paternal grandmother. There is no history of Breast cancer, Colon  cancer, or Heart attack.  ROS:   Please see the history of present illness.    All other systems reviewed and are negative.   Prior CV studies:   The following studies were reviewed today:  TTE: 2016 - Left ventricle: The cavity size was normal. There was focal basal   and moderate concentric hypertrophy. Systolic function was   normal. The estimated ejection fraction was in the range of 60%   to 65%. Wall motion was normal; there were no regional wall   motion abnormalities. Doppler parameters are consistent with   abnormal left ventricular relaxation (grade 1 diastolic   dysfunction).  Labs/Other Tests and Data Reviewed:    EKG:  No ECG reviewed.  Recent Labs: 10/01/2018: ALT 13; BUN 16; Creatinine, Ser 0.86; Hemoglobin 12.5; Platelets 158; Potassium 4.5; Sodium 139; TSH 2.980   Recent Lipid Panel Lab Results  Component Value Date/Time   CHOL 135 10/01/2018 10:36 AM   TRIG 93 10/01/2018 10:36 AM   HDL 84 10/01/2018 10:36 AM   CHOLHDL 1.6 10/01/2018 10:36 AM   CHOLHDL 3 05/05/2018 11:49 AM   LDLCALC 98 05/05/2018 11:49 AM   LDLDIRECT 126.2 12/07/2010 10:39 AM    Wt Readings from Last 3 Encounters:  10/17/18 187 lb (84.8 kg)  10/07/18 191 lb (86.6 kg)  07/14/18 187 lb (84.8 kg)     Objective:    Vital Signs:  BP 122/66   Pulse 84   Wt 187 lb (84.8 kg)   BMI 34.20 kg/m    VITAL SIGNS:  reviewed  ASSESSMENT & PLAN:    1.  Hypertension- well controlled on Imdur, Lisinopril, Bystolic.  2. CAD: Left heart cath in 03/2016 showed 40% mid RCA and 50% mid LAD stenosis.  Continue plavix (on for TIA), statin and BB. Asymptomatic.  3. Chronic diastolic CHF: Currently euvolemic, normal kidney function, electrolytes.  4. HLD: continue statin. HDL 84, LDL 34, TG: 93. Normal LFTS on 10/01/2018. Amazing results!  5. Hx of TIA: continue plavix and statin, only if absolutely necessary if her epistaxis continues we will discontinue Plavix.  6.  Polycystic kidney disease -with  normal kidney function, we will refer to nephrology.  COVID-19 Education: The signs and symptoms of COVID-19 were discussed with the patient and how to  seek care for testing (follow up with PCP or arrange E-visit).  The importance of social distancing was discussed today.  Time:   Today, I have spent 25 minutes with the patient with telehealth technology discussing the above problems.    Medication Adjustments/Labs and Tests Ordered: Current medicines are reviewed at length with the patient today.  Concerns regarding medicines are outlined above.   Tests Ordered: No orders of the defined types were placed in this encounter.   Medication Changes: No orders of the defined types were placed in this encounter.   Follow Up:  In Person in 6 month(s)  Signed, Ena Dawley, MD  10/17/2018 10:39 AM    [place]

## 2018-10-17 ENCOUNTER — Other Ambulatory Visit: Payer: Self-pay

## 2018-10-17 ENCOUNTER — Encounter: Payer: Self-pay | Admitting: Cardiology

## 2018-10-17 ENCOUNTER — Telehealth (INDEPENDENT_AMBULATORY_CARE_PROVIDER_SITE_OTHER): Payer: Medicare Other | Admitting: Cardiology

## 2018-10-17 ENCOUNTER — Encounter: Payer: Self-pay | Admitting: *Deleted

## 2018-10-17 DIAGNOSIS — E782 Mixed hyperlipidemia: Secondary | ICD-10-CM | POA: Diagnosis not present

## 2018-10-17 DIAGNOSIS — I5032 Chronic diastolic (congestive) heart failure: Secondary | ICD-10-CM

## 2018-10-17 DIAGNOSIS — I2583 Coronary atherosclerosis due to lipid rich plaque: Secondary | ICD-10-CM

## 2018-10-17 DIAGNOSIS — I10 Essential (primary) hypertension: Secondary | ICD-10-CM | POA: Diagnosis not present

## 2018-10-17 DIAGNOSIS — I2510 Atherosclerotic heart disease of native coronary artery without angina pectoris: Secondary | ICD-10-CM | POA: Diagnosis not present

## 2018-10-17 NOTE — Patient Instructions (Signed)
Medication Instructions:   Your physician recommends that you continue on your current medications as directed. Please refer to the Current Medication list given to you today.  If you need a refill on your cardiac medications before your next appointment, please call your pharmacy.     Follow-Up: At [facility], you and your health needs are our priority.  As part of our continuing mission to provide you with exceptional heart care, we have created designated Provider Care Teams.  These Care Teams include your primary Cardiologist (physician) and Advanced Practice Providers (APPs -  Physician Assistants and Nurse Practitioners) who all work together to provide you with the care you need, when you need it.  Your physician wants you to follow-up in: 6 MONTHS WITH DR NELSON IN PERSON You will receive a reminder letter in the mail two months in advance. If you don't receive a letter, please call our office to schedule the follow-up appointment.    

## 2018-11-04 NOTE — Progress Notes (Signed)
Subjective:    Patient ID: Samantha [name], female    DOB: May 06, 1940, 78 y.o.   MRN: [id number]  HPI The patient is here for follow up.  She is very active, not exercising regularly.     CAD, h/o TIA, chronic diastolic dysfunction, Hypertension: She is taking her medication daily. She is compliant with a low sodium diet.  She denies chest pain, edema, shortness of breath and regular headaches.    Hyperlipidemia: She is taking her medication daily. She is compliant with a low fat/cholesterol diet. She denies myalgias.   Diabetes with neuropathy: She is taking her medication daily as prescribed. She is compliant with a diabetic diet.  She monitors her sugars and they have been running 100-160.  Fibromyalgia:  She takes lyrica at night only as prescribed.  The lyrica works well.  She can not take it during the day because it makes her too drowsy.  She has had several nights where medication did not seem to work as well and she wonders if a higher dose is something she should consider.  Rheumatoid arthritis: She is following with her rheumatologist.  She does have pain throughout her body.  She does have increased stress because of take caring for her husband.  She does have some help coming into the home in the morning, but it is still a lot for her to be doing.  He may need to be placed in a nursing home In the near future and she is trying to prepare for that as well.  Medications and allergies reviewed with patient and updated if appropriate.  Patient Active Problem List   Diagnosis Date Noted  . Unilateral primary osteoarthritis, right knee 01/28/2018  . 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 01/28/2018  . GERD (gastroesophageal reflux disease) 10/23/2017  . Sacral back pain 10/16/2017  . Chronic left upper quadrant pain 07/23/2017  . Rectal bleeding 07/23/2017  . Poor balance 03/14/2017  . Right shoulder pain 03/14/2017  . Hair loss  10/19/2016  . Angina pectoris ([place]) 03/22/2016  . Hypertensive heart disease 07/07/2015  . Essential hypertension 07/07/2015  . Plantar fasciitis, left 03/22/2015  . Coronary artery disease due to lipid rich plaque 01/05/2015  . Chronic diastolic CHF (congestive heart failure), NYHA class 2 ([place]) 01/05/2015  . 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 Migraine (Ocular) 01/05/2014  . VBI (vertebrobasilar insufficiency) 08/22/2012  . TIA (transient ischemic attack) 06/27/2012  . DJD (degenerative joint disease) 02/02/2011  . 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 Postherpetic neuralgia ? 01/02/2010  . Palpitations 11/23/2008  . UTI'S, RECURRENT 09/28/2008  . Fibromyalgia 08/15/2007  . 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 Hyperlipidemia 05/21/2006  . Reactive airway disease 01/29/2002    Current Outpatient Medications on File Prior to Visit  Medication Sig Dispense Refill  . acetaminophen (TYLENOL) 500 MG tablet Take 500 mg by mouth every 6 (six) hours as needed.    [name] albuterol (PROVENTIL HFA;VENTOLIN HFA) 108 (90 Base) MCG/ACT inhaler Inhale 2 puffs into the lungs every 6 (six) hours as needed for wheezing or shortness of breath. 18 g 3  . budesonide-formoterol (SYMBICORT) 80-4.5 MCG/ACT inhaler Inhale 2 puffs into the lungs 2 (two) times daily. (Patient taking differently: Inhale 2 puffs into the lungs 2 (two) times daily as needed (for shortness of breath or wheezing). ) 1 Inhaler 3  . butalbital-acetaminophen-caffeine (  FIORICET) 50-325-40 MG tablet Take 1 tablet by mouth every 6 (six) hours as needed for headache or migraine. 30 tablet 5  . BYSTOLIC 10 MG tablet TAKE 1 TABLET(10 MG) BY MOUTH DAILY 90 tablet 1  . CALCIUM PO Take 1 tablet by mouth every morning.     . cetirizine (ZYRTEC) 10 MG tablet TAKE 1 TABLET(10 MG) BY MOUTH DAILY 90 tablet 0  . cholecalciferol (VITAMIN D) 1000 UNITS tablet Take  1,000 Units by mouth every morning.     . clopidogrel (PLAVIX) 75 MG tablet TAKE 1 TABLET(75 MG) BY MOUTH DAILY 90 tablet 1  . Evolocumab (REPATHA SURECLICK) [id number] MG/ML SOAJ Inject 140 mg into the skin every 14 (fourteen) days. 2 pen 11  . hydrochlorothiazide (HYDRODIURIL) 25 MG tablet Take 0.5 tablets (12.5 mg total) by mouth daily. 45 tablet 1  . HYDROcodone-acetaminophen (NORCO/VICODIN) 5-325 MG tablet Take 1 tablet by mouth every 6 (six) hours as needed for moderate pain. 30 tablet 0  . hydrocortisone 2.5 % cream Apply topically 2 (two) times daily. (Patient taking differently: Apply 1 application topically 2 (two) times daily as needed (for rash). ) 30 g 0  . hydroxychloroquine (PLAQUENIL) 200 MG tablet hydroxychloroquine 200 mg tablet    . isosorbide mononitrate (IMDUR) 60 MG 24 hr tablet Take 1 tablet (60 mg total) by mouth daily. 90 tablet 3  . lisinopril (PRINIVIL,ZESTRIL) 40 MG tablet Take 1 tablet (40 mg total) by mouth daily. (Patient taking differently: Take 20 mg by mouth daily. ) 90 tablet 1  . metFORMIN (GLUCOPHAGE-XR) 500 MG 24 hr tablet TAKE 1 TABLET(500 MG) BY MOUTH DAILY WITH BREAKFAST 90 tablet 1  . Polyethyl Glycol-Propyl Glycol (SYSTANE) 0.4-0.3 % GEL Place 1 application into both eyes daily as needed (for dry eyes).     . Potassium 99 MG TABS Take 99 mg by mouth daily.     . pregabalin (LYRICA) 100 MG capsule TAKE 1 CAPSULE(100 MG) BY MOUTH DAILY AS NEEDED FOR PAIN 30 capsule 0  . promethazine (PHENERGAN) 25 MG tablet Take 1 tablet (25 mg total) by mouth every 8 (eight) hours as needed for nausea or vomiting. 20 tablet 0   No current facility-administered medications on file prior to visit.     Past Medical History:  Diagnosis Date  . Abnormal CT of the chest 2008   last CT4-l 2009:  . No f/u suggested   . Allergy   . Asthma   . CAD (coronary artery disease)    a. Coronary CTA 10/16: Coronary Ca score 211, mod non-obstructive CAD with LM mild plaque (25-50%), mid LAD  50-69%. b. Neg nuc 06/2015.  . Cataract    BILATERAL-REMOVED  . Chronic diastolic CHF (congestive heart failure) ([place])   . Collagen vascular disease ([place])    "arterial sclerosis" per pt  . Complication of anesthesia    trouble waking up  . Fibromyalgia   . GERD (gastroesophageal reflux disease)   . H/O hiatal hernia   . Heart murmur   . Hyperlipidemia   . Hypertension   . Malignant neoplasm of ascending colon ([place]) 2016   Minimally invasive right hemicolectomy to be done   . Neuromuscular disorder (HCC)    FIBROMYALGIA  . Ocular migraine   . OSA (obstructive sleep apnea) 09/2007   dx w/ a sleep study, not on  CPAP  . Osteoarthritis   . Osteoporosis   . Pneumonia    "double" in 2004  . PONV (postoperative nausea and vomiting)   .  Reactive airway disease 01/29/2002   dx of pseudoasthma / vcd in 2005 and nl sprirometry History of dyspnea, 2011,  improved after several medications were changed around Question of COPD, disproved July 06, 2009 with nl pft's      . Rheumatoid factor positive   . Shingles 11/2009  . Sleep apnea   . Stroke ([place])   . TIA (transient ischemic attack)    x2 - on Plavix for this  . Torn rotator cuff    right worse than left, both are torn  . Tumor, 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  . Type II diabetes mellitus ([place])   . 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 Vaginal dysplasia     Past Surgical History:  Procedure Laterality Date  . ABDOMINAL HYSTERECTOMY  1980   NO oophorectomy per pt   . ANTERIOR CERVICAL DECOMP/DISCECTOMY FUSION  2001   C 3, C4 and C5 plate and screws  . BREAST BIOPSY Right 1999  . BUNIONECTOMY Left ~ 1977  . CARDIAC CATHETERIZATION     2018 By Dr. Pernell Dupre (done after colon surgery)  . CATARACT EXTRACTION W/ INTRAOCULAR LENS  IMPLANT, BILATERAL  2012  . COLON SURGERY  10/2014  . EYE SURGERY Bilateral    torq lens for cataracts  . LEFT HEART CATH AND CORONARY ANGIOGRAPHY N/A 03/22/2016   Procedure: Left Heart Cath and  Coronary Angiography;  Surgeon: Belva Crome, MD;  Location: [facility];  Service: Cardiovascular;  Laterality: N/A;  . LUMBAR LAMINECTOMY/DECOMPRESSION MICRODISCECTOMY N/A 01/20/2018   Procedure: L4-5 decompression;  Surgeon: Marybelle Killings, MD;  Location: [facility];  Service: Orthopedics;  Laterality: N/A;  . THYROIDECTOMY, PARTIAL  1960's  . VAGINAL MASS EXCISION  1994   "Laser surgery for vaginal cancer; followed by chemotherapy" (06/27/2012)    Social History   Socioeconomic History  . Marital status: Married    Spouse name: Ilona Sorrel  . Number of children: 2  . Years of education: masters  . Highest education level: Not on file  Occupational History  . Occupation: Retired, disable since 2000    Employer: RETIRED  Social Needs  . Financial resource strain: Not hard at all  . Food insecurity    Worry: Never true    Inability: Never true  . Transportation needs    Medical: No    Non-medical: No  Tobacco Use  . Smoking status: Former Smoker    Packs/day: 0.25    Years: 5.00    Pack years: 1.25    Types: Cigarettes    Quit date: 01/29/1998    Years since quitting: 20.7  . Smokeless tobacco: Never Used  . Tobacco comment: Quit in 2001  Substance and Sexual Activity  . Alcohol use: No    Alcohol/week: 0.0 standard drinks  . Drug use: No  . Sexual activity: Never  Lifestyle  . Physical activity    Days per week: 5 days    Minutes per session: 30 min  . Stress: To some extent  Relationships  . Social connections    Talks on phone: More than three times a week    Gets together: More than three times a week    Attends religious service: More than 4 times per year    Active member of club or organization: Yes    Attends meetings of clubs or organizations: More than 4 times per year    Relationship status: Married  Other Topics Concern  .  Not on file  Social History Narrative   On disability since 2000--- also husband has MS   Education. College.   Right handed.     Family History  Problem Relation Age of Onset  . Heart disease Father   . Heart disease Mother   . Lung cancer Mother   . Allergies Sister   . Parkinsonism Sister        possible  . Asthma Sister   . Asthma Paternal Grandmother   . Stroke Paternal Grandmother   . Heart disease Other        paternal grandparents, maternal grandparents,   . Heart disease Brother   . Emphysema Brother   . Aneurysm Brother        x3  . Kidney failure Brother   . Diabetes Brother   . Diabetes Brother   . Stroke Brother   . Stroke Maternal Grandmother   . Breast cancer Neg Hx   . Colon cancer Neg Hx   . Heart attack Neg Hx     Review of Systems  Constitutional: Negative for chills and fever.  Respiratory: Positive for wheezing (occ, dust ). Negative for cough and shortness of breath.   Cardiovascular: Positive for palpitations (occ). Negative for chest pain and leg swelling.  Neurological: Negative for light-headedness and headaches.       Objective:   Vitals:   11/05/18 1105  BP: (!) 148/78  Pulse: 88  Resp: 16  Temp: 98.3 F (36.8 C)  SpO2: 99%   BP Readings from Last 3 Encounters:  11/05/18 (!) 148/78  10/17/18 122/66  10/07/18 128/76   Wt Readings from Last 3 Encounters:  11/05/18 193 lb (87.5 kg)  10/17/18 187 lb (84.8 kg)  10/07/18 191 lb (86.6 kg)   Body mass index is 35.3 kg/m.   Physical Exam    Constitutional: Appears well-developed and well-nourished. No distress.  HENT:  Head: Normocephalic and atraumatic.  Neck: Neck supple. No tracheal deviation present. No thyromegaly present.  No cervical lymphadenopathy Cardiovascular: Normal rate, regular rhythm and normal heart sounds.   No murmur heard. No carotid bruit .  No edema Pulmonary/Chest: Effort normal and breath sounds normal. No respiratory distress. No has no wheezes. No rales.  Skin: Skin is warm and dry. Not diaphoretic.  Psychiatric: Normal mood and affect. Behavior is normal.      Assessment &  Plan:    See Problem List for Assessment and Plan of chronic medical problems.

## 2018-11-04 NOTE — Patient Instructions (Addendum)
  Your a1c was checked today.   Flu immunization administered today.    Medications reviewed and updated.  Changes include :   none  Your prescription(s) have been submitted to your pharmacy. Please take as directed and contact our office if you believe you are having problem(s) with the medication(s).    Please followup in 6 months

## 2018-11-05 ENCOUNTER — Ambulatory Visit (INDEPENDENT_AMBULATORY_CARE_PROVIDER_SITE_OTHER): Payer: Medicare Other | Admitting: Internal Medicine

## 2018-11-05 ENCOUNTER — Encounter: Payer: Self-pay | Admitting: Internal Medicine

## 2018-11-05 ENCOUNTER — Other Ambulatory Visit: Payer: Self-pay

## 2018-11-05 VITALS — BP 148/78 | HR 88 | Temp 98.3°F | Resp 16 | Ht 62.0 in | Wt 193.0 lb

## 2018-11-05 DIAGNOSIS — R0602 Shortness of breath: Secondary | ICD-10-CM

## 2018-11-05 DIAGNOSIS — E782 Mixed hyperlipidemia: Secondary | ICD-10-CM

## 2018-11-05 DIAGNOSIS — R42 Dizziness and giddiness: Secondary | ICD-10-CM

## 2018-11-05 DIAGNOSIS — I2510 Atherosclerotic heart disease of native coronary artery without angina pectoris: Secondary | ICD-10-CM | POA: Diagnosis not present

## 2018-11-05 DIAGNOSIS — R5383 Other fatigue: Secondary | ICD-10-CM

## 2018-11-05 DIAGNOSIS — M797 Fibromyalgia: Secondary | ICD-10-CM

## 2018-11-05 DIAGNOSIS — I5032 Chronic diastolic (congestive) heart failure: Secondary | ICD-10-CM | POA: Diagnosis not present

## 2018-11-05 DIAGNOSIS — Z23 Encounter for immunization: Secondary | ICD-10-CM | POA: Diagnosis not present

## 2018-11-05 DIAGNOSIS — I10 Essential (primary) hypertension: Secondary | ICD-10-CM

## 2018-11-05 DIAGNOSIS — Z794 Long term (current) use of insulin: Secondary | ICD-10-CM

## 2018-11-05 DIAGNOSIS — E11311 Type 2 diabetes mellitus with unspecified diabetic retinopathy with macular edema: Secondary | ICD-10-CM | POA: Diagnosis not present

## 2018-11-05 DIAGNOSIS — I2583 Coronary atherosclerosis due to lipid rich plaque: Secondary | ICD-10-CM

## 2018-11-05 LAB — POCT GLYCOSYLATED HEMOGLOBIN (HGB A1C): Hemoglobin A1C: 5.8 % — AB (ref 4.0–5.6)

## 2018-11-05 MED ORDER — PREGABALIN 100 MG PO CAPS: ORAL_CAPSULE | ORAL | 0 refills | Status: DC

## 2018-11-05 MED ORDER — PREGABALIN 150 MG PO CAPS: 150.0000 mg | ORAL_CAPSULE | Freq: Every day | ORAL | 0 refills | Status: DC

## 2018-11-05 NOTE — Assessment & Plan Note (Signed)
No chest pain, shortness of breath Following with cardiology At, but not exercising medications

## 2018-11-05 NOTE — Assessment & Plan Note (Addendum)
On repatha Lipids very well controlled

## 2018-11-05 NOTE — Assessment & Plan Note (Addendum)
lyrica once at night Working well, but does not seem to be as effective for her pain We will try increasing to 150 mg at night

## 2018-11-05 NOTE — Assessment & Plan Note (Signed)
BP Readings from Last 3 Encounters:  11/05/18 (!) 148/78  10/17/18 122/66  10/07/18 128/76    BP well controlled Current regimen effective and well tolerated Continue current medications at current doses cmp

## 2018-11-05 NOTE — Assessment & Plan Note (Signed)
a1c today well controlled Continue metformin

## 2018-12-09 ENCOUNTER — Ambulatory Visit: Payer: Medicare Other | Admitting: Orthopaedic Surgery

## 2018-12-26 ENCOUNTER — Other Ambulatory Visit: Payer: Self-pay | Admitting: Cardiology

## 2018-12-26 ENCOUNTER — Other Ambulatory Visit: Payer: Self-pay | Admitting: Internal Medicine

## 2018-12-26 DIAGNOSIS — Q613 Polycystic kidney, unspecified: Secondary | ICD-10-CM

## 2018-12-26 DIAGNOSIS — I119 Hypertensive heart disease without heart failure: Secondary | ICD-10-CM

## 2018-12-26 DIAGNOSIS — E782 Mixed hyperlipidemia: Secondary | ICD-10-CM

## 2018-12-26 DIAGNOSIS — I2510 Atherosclerotic heart disease of native coronary artery without angina pectoris: Secondary | ICD-10-CM

## 2018-12-26 DIAGNOSIS — Z01818 Encounter for other preprocedural examination: Secondary | ICD-10-CM

## 2019-01-10 ENCOUNTER — Other Ambulatory Visit: Payer: Self-pay | Admitting: Internal Medicine

## 2019-01-13 ENCOUNTER — Encounter: Payer: Self-pay | Admitting: Orthopaedic Surgery

## 2019-01-13 ENCOUNTER — Other Ambulatory Visit: Payer: Self-pay

## 2019-01-13 ENCOUNTER — Ambulatory Visit (INDEPENDENT_AMBULATORY_CARE_PROVIDER_SITE_OTHER): Payer: Medicare Other | Admitting: Orthopaedic Surgery

## 2019-01-13 VITALS — Ht 62.0 in | Wt 193.0 lb

## 2019-01-13 DIAGNOSIS — M1711 Unilateral primary osteoarthritis, right knee: Secondary | ICD-10-CM | POA: Diagnosis not present

## 2019-01-13 DIAGNOSIS — M1712 Unilateral primary osteoarthritis, left knee: Secondary | ICD-10-CM

## 2019-01-13 DIAGNOSIS — I2510 Atherosclerotic heart disease of native coronary artery without angina pectoris: Secondary | ICD-10-CM

## 2019-01-13 NOTE — Progress Notes (Signed)
Office Visit Note   Patient: Samantha [name]           Date of Birth: 1940/11/08           MRN: [id number] Visit Date: 01/13/2019              Requested by: [name], MD [place],  [place] [address] PCP: [name], MD   Assessment & Plan: Visit Diagnoses:  1. Unilateral primary osteoarthritis, right knee   2. Primary osteoarthritis of left knee     Plan: Patient understands that with her right knee end-stage degenerative changes and worsening valgus deformity definitive treatment is total knee replacement.  States that she is wanting to have this done possibly sometime in the early spring.  I did advise her if she feels like her knee is worsening she should consider entertaining this sooner.  Follow-up in the office with Dr. Lorin Mercy in 2 months for recheck and will get new x-rays at that time of the knees.  Her back is doing well at this time and she will also let us know she is having any issues there.  Follow-Up Instructions: Return in about 2 months (around 03/16/2019) for dr yates.   Orders:  No orders of the defined types were placed in this encounter.  No orders of the defined types were placed in this encounter.     Procedures: No procedures performed   Clinical Data: No additional findings.   Subjective: Chief Complaint  Patient presents with  . Lower Back - Follow-up  . Right Knee - Follow-up    HPI 78 year old black female returns for recheck.  Patient is status post L4-5 decompression January 20, 2018 and states that her back is doing well.  A few weeks ago she did have one episode of severe pain in the upper lumbar area when she went to stand up after sitting for about 20 minutes.  Pain lasted for a few seconds and then resolved.  She did not have any associated leg symptoms.  She states that her knees are currently doing reasonably well.  She has known history of end-stage DJD bilateral knees with right greater than left valgus  deformities.  She states that she feels like her right knee valgus is worsening.  Does continue to have some pain in the knee and she is wanting to proceed with getting right total knee replacement possibly in the early spring. Review of Systems No current cardiac pulmonary GI GU issues  Objective: Vital Signs: Ht 5\' 2"  (1.575 m)   Wt 193 lb (87.5 kg)   BMI 35.30 kg/m   Physical Exam HENT:     Head: Normocephalic.  Pulmonary:     Effort: No respiratory distress.  Musculoskeletal:     Comments: Gait is somewhat antalgic.  Negative logroll bilateral hips.  Bilateral knees patient has a right greater than left valgus deformity.  Right knee is correctable to neutral.  Joint line is tender.  Positive bilateral patellofemoral crepitus.  Bilateral calves are nontender.  Neurological:     General: No focal deficit present.     Mental Status: She is alert and oriented to person, place, and time.     Ortho Exam  Specialty Comments:  No specialty comments available.  Imaging: No results found.   PMFS History: Patient Active Problem List   Diagnosis Date Noted  . Unilateral primary osteoarthritis, right knee 01/28/2018  . Status post lumbar spine operative procedure for decompression  of spinal cord 01/28/2018  . Sacral back pain 10/16/2017  . Chronic left upper quadrant pain 07/23/2017  . Rectal bleeding 07/23/2017  . Poor balance 03/14/2017  . Right shoulder pain 03/14/2017  . Angina pectoris ([facility]) 03/22/2016  . Hypertensive heart disease 07/07/2015  . Essential hypertension 07/07/2015  . Plantar fasciitis, left 03/22/2015  . Coronary artery disease due to lipid rich plaque 01/05/2015  . Chronic diastolic CHF (congestive heart failure), NYHA class 2 ([facility]) 01/05/2015  . 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 Migraine (Ocular) 01/05/2014  . VBI (vertebrobasilar insufficiency) 08/22/2012  . TIA (transient ischemic  attack) 06/27/2012  . DJD (degenerative joint disease) 02/02/2011  . 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 Postherpetic neuralgia ? 01/02/2010  . Palpitations 11/23/2008  . UTI'S, RECURRENT 09/28/2008  . Fibromyalgia 08/15/2007  . 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 Hyperlipidemia 05/21/2006  . Reactive airway disease 01/29/2002   Past Medical History:  Diagnosis Date  . Abnormal CT of the chest 2008   last CT4-l 2009:  . No f/u suggested   . Allergy   . Asthma   . CAD (coronary artery disease)    a. Coronary CTA 10/16: Coronary Ca score 211, mod non-obstructive CAD with LM mild plaque (25-50%), mid LAD 50-69%. b. Neg nuc 06/2015.  . Cataract    BILATERAL-REMOVED  . Chronic diastolic CHF (congestive heart failure) ([facility])   . Collagen vascular disease ([facility])    "arterial sclerosis" per pt  . Complication of anesthesia    trouble waking up  . Fibromyalgia   . GERD (gastroesophageal reflux disease)   . H/O hiatal hernia   . Heart murmur   . Hyperlipidemia   . Hypertension   . Malignant neoplasm of ascending colon ([facility] [place]) 2016   Minimally invasive right hemicolectomy to be done   . Neuromuscular disorder ([facility])    FIBROMYALGIA  . Ocular migraine   . OSA (obstructive sleep apnea) 09/2007   dx w/ a sleep study, not on  CPAP  . Osteoarthritis   . Osteoporosis   . Pneumonia    "double" in 2004  . PONV (postoperative nausea and vomiting)   . Reactive airway disease 01/29/2002   dx of pseudoasthma / vcd in 2005 and nl sprirometry History of dyspnea, 2011,  improved after several medications were changed around Question of COPD, disproved July 06, 2009 with nl pft's      . Rheumatoid factor positive   . Shingles 11/2009  . Sleep apnea   . Stroke ([facility])   . TIA (transient ischemic attack)    x2 - on Plavix for this  . Torn rotator cuff    right worse than left, both are torn  . Tumor, 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  . Type II diabetes mellitus  ([facility])   . Vaginal cancer ([facility]) 1994  . Vaginal dysplasia     Family History  Problem Relation Age of Onset  . Heart disease Father   . Heart disease Mother   . Lung cancer Mother   . Allergies Sister   . Parkinsonism Sister        possible  . Asthma Sister   . Asthma Paternal Grandmother   . Stroke Paternal Grandmother   . Heart disease Other        paternal grandparents, maternal grandparents,   . Heart disease Brother   . Emphysema  Brother   . Aneurysm Brother        x3  . Kidney failure Brother   . Diabetes Brother   . Diabetes Brother   . Stroke Brother   . Stroke Maternal Grandmother   . Breast cancer Neg Hx   . Colon cancer Neg Hx   . Heart attack Neg Hx     Past Surgical History:  Procedure Laterality Date  . ABDOMINAL HYSTERECTOMY  1980   NO oophorectomy per pt   . ANTERIOR CERVICAL DECOMP/DISCECTOMY FUSION  2001   C 3, C4 and C5 plate and screws  . BREAST BIOPSY Right 1999  . BUNIONECTOMY Left ~ 1977  . CARDIAC CATHETERIZATION     2018 By Dr. Pernell Dupre (done after colon surgery)  . CATARACT EXTRACTION W/ INTRAOCULAR LENS  IMPLANT, BILATERAL  2012  . COLON SURGERY  10/2014  . EYE SURGERY Bilateral    torq lens for cataracts  . LEFT HEART CATH AND CORONARY ANGIOGRAPHY N/A 03/22/2016   Procedure: Left Heart Cath and Coronary Angiography;  Surgeon: Belva Crome, MD;  Location: [facility];  Service: Cardiovascular;  Laterality: N/A;  . LUMBAR LAMINECTOMY/DECOMPRESSION MICRODISCECTOMY N/A 01/20/2018   Procedure: L4-5 decompression;  Surgeon: Marybelle Killings, MD;  Location: [facility];  Service: Orthopedics;  Laterality: N/A;  . THYROIDECTOMY, PARTIAL  1960's  . VAGINAL MASS EXCISION  1994   "Laser surgery for vaginal cancer; followed by chemotherapy" (06/27/2012)   Social History   Occupational History  . Occupation: Retired, disable since 2000    Employer: RETIRED  Tobacco Use  . Smoking status: Former Smoker    Packs/day: 0.25    Years: 5.00    Pack  years: 1.25    Types: Cigarettes    Quit date: 01/29/1998    Years since quitting: 20.9  . Smokeless tobacco: Never Used  . Tobacco comment: Quit in 2001  Substance and Sexual Activity  . Alcohol use: No    Alcohol/week: 0.0 standard drinks  . Drug use: No  . Sexual activity: Never

## 2019-01-27 ENCOUNTER — Ambulatory Visit: Payer: Medicare Other | Attending: Internal Medicine

## 2019-01-27 DIAGNOSIS — Z20822 Contact with and (suspected) exposure to covid-19: Secondary | ICD-10-CM

## 2019-01-28 LAB — NOVEL CORONAVIRUS, NAA: SARS-CoV-2, NAA: NOT DETECTED

## 2019-02-04 ENCOUNTER — Ambulatory Visit: Payer: Self-pay | Admitting: *Deleted

## 2019-02-04 NOTE — Telephone Encounter (Signed)
Noticed the 4th finger of her right hand feeling numb this morning along with turning dark. Denies all other symptoms at this time. No injury to finger she is able to move and use the finger. Had her hold hand under luke warm water for 1-2 minutes the coloring and feeling came back. Reviewed urgent symptoms requiring immediate evaluation. Stated she understood.   Reason for Disposition . [1] Numbness (i.e., loss of sensation) in hand or fingers AND [2] new-onset  Answer Assessment - Initial Assessment Questions 1. ONSET: "When did the pain start?"      Yesterday 2. LOCATION and RADIATION: "Where is the pain located?"  (e.g., fingertip, around nail, joint, entire  finger)      Right hand 4th finger. 3. SEVERITY: "How bad is the pain?" "What does it keep you from doing?"   (Scale 1-10; or mild, moderate, severe)  - MILD - doesn't interfere with normal activities   - MODERATE - interferes with normal activities or awakens from sleep  - SEVERE - excruciating pain, unable to hold a glass of water or bend finger even a little     Only numbness 4. APPEARANCE: "What does the finger look like?" (e.g., redness, swelling, bruising, pallor)     Nearly entire finger has turned dark. 5. WORK OR EXERCISE: "Has there been any recent work or exercise that involved this part of the body?"     none 6. CAUSE: "What do you think is causing the pain?"     unsure 7. AGGRAVATING FACTORS: "What makes the pain worse?" (e.g., using computer)     none 8. OTHER SYMPTOMS: "Do you have any other symptoms?" (e.g., fever, neck pain, numbness)    Finger is numb 9. PREGNANCY: "Is there any chance you are pregnant?" "When was your last menstrual period?"     na  Protocols used: FINGER PAIN-A-AH

## 2019-02-09 ENCOUNTER — Other Ambulatory Visit: Payer: Self-pay | Admitting: Internal Medicine

## 2019-02-12 ENCOUNTER — Emergency Department (HOSPITAL_COMMUNITY)
Admission: EM | Admit: 2019-02-12 | Discharge: 2019-02-12 | Disposition: A | Discharge status: Home / Self Care | Payer: Medicare Other | Attending: Emergency Medicine | Admitting: Emergency Medicine

## 2019-02-12 ENCOUNTER — Emergency Department (HOSPITAL_COMMUNITY): Payer: Medicare Other

## 2019-02-12 ENCOUNTER — Encounter (HOSPITAL_COMMUNITY): Payer: Self-pay | Admitting: Emergency Medicine

## 2019-02-12 ENCOUNTER — Ambulatory Visit: Payer: Self-pay | Admitting: *Deleted

## 2019-02-12 DIAGNOSIS — R1031 Right lower quadrant pain: Secondary | ICD-10-CM | POA: Insufficient documentation

## 2019-02-12 DIAGNOSIS — K5730 Diverticulosis of large intestine without perforation or abscess without bleeding: Secondary | ICD-10-CM | POA: Diagnosis not present

## 2019-02-12 DIAGNOSIS — Z7984 Long term (current) use of oral hypoglycemic drugs: Secondary | ICD-10-CM | POA: Diagnosis not present

## 2019-02-12 DIAGNOSIS — R109 Unspecified abdominal pain: Secondary | ICD-10-CM | POA: Diagnosis not present

## 2019-02-12 DIAGNOSIS — Z87891 Personal history of nicotine dependence: Secondary | ICD-10-CM | POA: Diagnosis not present

## 2019-02-12 DIAGNOSIS — I2510 Atherosclerotic heart disease of native coronary artery without angina pectoris: Secondary | ICD-10-CM | POA: Diagnosis not present

## 2019-02-12 DIAGNOSIS — Z85038 Personal history of other malignant neoplasm of large intestine: Secondary | ICD-10-CM | POA: Insufficient documentation

## 2019-02-12 DIAGNOSIS — I110 Hypertensive heart disease with heart failure: Secondary | ICD-10-CM | POA: Insufficient documentation

## 2019-02-12 DIAGNOSIS — Z7901 Long term (current) use of anticoagulants: Secondary | ICD-10-CM | POA: Insufficient documentation

## 2019-02-12 DIAGNOSIS — M5441 Lumbago with sciatica, right side: Secondary | ICD-10-CM

## 2019-02-12 DIAGNOSIS — I5032 Chronic diastolic (congestive) heart failure: Secondary | ICD-10-CM | POA: Insufficient documentation

## 2019-02-12 DIAGNOSIS — E119 Type 2 diabetes mellitus without complications: Secondary | ICD-10-CM | POA: Diagnosis not present

## 2019-02-12 DIAGNOSIS — M545 Low back pain: Secondary | ICD-10-CM | POA: Diagnosis present

## 2019-02-12 LAB — CBC
HCT: 39.9 % (ref 36.0–46.0)
Hemoglobin: 12.7 g/dL (ref 12.0–15.0)
MCH: 27.7 pg (ref 26.0–34.0)
MCHC: 31.8 g/dL (ref 30.0–36.0)
MCV: 86.9 fL (ref 80.0–100.0)
Platelets: 170 10*3/uL (ref 150–400)
RBC: 4.59 MIL/uL (ref 3.87–5.11)
RDW: 14.1 % (ref 11.5–15.5)
WBC: 4.3 10*3/uL (ref 4.0–10.5)
nRBC: 0 % (ref 0.0–0.2)

## 2019-02-12 LAB — URINALYSIS, ROUTINE W REFLEX MICROSCOPIC
Bilirubin Urine: NEGATIVE
Glucose, UA: NEGATIVE mg/dL
Hgb urine dipstick: NEGATIVE
Ketones, ur: NEGATIVE mg/dL
Nitrite: NEGATIVE
Protein, ur: NEGATIVE mg/dL
Specific Gravity, Urine: 1.011 (ref 1.005–1.030)
pH: 5 (ref 5.0–8.0)

## 2019-02-12 LAB — BASIC METABOLIC PANEL
Anion gap: 9 (ref 5–15)
BUN: 20 mg/dL (ref 8–23)
CO2: 26 mmol/L (ref 22–32)
Calcium: 9.4 mg/dL (ref 8.9–10.3)
Chloride: 103 mmol/L (ref 98–111)
Creatinine, Ser: 0.9 mg/dL (ref 0.44–1.00)
GFR calc Af Amer: >60 mL/min (ref >60)
GFR calc non Af Amer: >60 mL/min (ref >60)
Glucose, Bld: 81 mg/dL (ref 70–99)
Potassium: 4.1 mmol/L (ref 3.5–5.1)
Sodium: 138 mmol/L (ref 135–145)

## 2019-02-12 MED ORDER — KETOROLAC TROMETHAMINE 10 MG PO TABS
10.0000 mg | ORAL_TABLET | Freq: Once | ORAL | Status: AC
  Administered 2019-02-12: 10 mg via ORAL
  Filled 2019-02-12: qty 1

## 2019-02-12 MED ORDER — LIDOCAINE 5 % EX PTCH
1.0000 | MEDICATED_PATCH | CUTANEOUS | Status: DC
  Administered 2019-02-12: 1 via TRANSDERMAL
  Filled 2019-02-12: qty 1

## 2019-02-12 MED ORDER — CYCLOBENZAPRINE HCL 10 MG PO TABS
5.0000 mg | ORAL_TABLET | Freq: Once | ORAL | Status: AC
  Administered 2019-02-12: 5 mg via ORAL
  Filled 2019-02-12: qty 1

## 2019-02-12 NOTE — ED Provider Notes (Signed)
[name] EMERGENCY DEPARTMENT Provider Note   CSN: [id number] Arrival date & time: 02/12/19  1657     History Chief Complaint  Patient presents with  . Flank Pain    Samantha [name] is a 79 y.o. female.  HPI [age]-year-old female presenting to the ED for right back pain.  Patient states that earlier today she had an acute onset of right-sided back pain, it radiated down into her sacrum.  Patient denies any vaginal bleeding, discharge, no fevers or chills, denies any nausea or vomiting.  No chest pain or shortness of breath, no vomiting.  No urinary incontinence, no stool incontinence, denies any recent trauma.  Patient states that she had acute onset pain, states that she had some trouble walking secondary to the pain as it radiated down into her leg.  No vaginal bleeding or discharge.  Denies any saddle anesthesia, no recent illnesses, no recent sicknesses.  Patient states that she has no pelvic pain, no dysuria or hematuria, denies no history of renal stones but does report a history of a right renal cyst.  No hemoptysis or hematemesis.    Past Medical History:  Diagnosis Date  . Abnormal CT of the chest 2008   last CT4-l 2009:  . No f/u suggested   . Allergy   . Asthma   . CAD (coronary artery disease)    a. Coronary CTA 10/16: Coronary Ca score 211, mod non-obstructive CAD with LM mild plaque (25-50%), mid LAD 50-69%. b. Neg nuc 06/2015.  . Cataract    BILATERAL-REMOVED  . Chronic diastolic CHF (congestive heart failure) ([place])   . Collagen vascular disease ([place])    "arterial sclerosis" per pt  . Complication of anesthesia    trouble waking up  . Fibromyalgia   . GERD (gastroesophageal reflux disease)   . H/O hiatal hernia   . Heart murmur   . Hyperlipidemia   . Hypertension   . 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 Neuromuscular disorder (HCC)    FIBROMYALGIA  . Ocular migraine    . OSA (obstructive sleep apnea) 09/2007   dx w/ a sleep study, not on  CPAP  . Osteoarthritis   . Osteoporosis   . Pneumonia    "double" in 2004  . PONV (postoperative nausea and vomiting)   . Reactive airway disease 01/29/2002   dx of pseudoasthma / vcd in 2005 and nl sprirometry History of dyspnea, 2011,  improved after several medications were changed around Question of COPD, disproved July 06, 2009 with nl pft's      . Rheumatoid factor positive   . Shingles 11/2009  . Sleep apnea   . Stroke ([place])   . TIA (transient ischemic attack)    x2 - on Plavix for this  . Torn rotator cuff    right worse than left, both are torn  . Tumor, 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  . Type II diabetes mellitus ([place])   . 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 Vaginal dysplasia     Patient Active Problem List   Diagnosis Date Noted  . Unilateral primary osteoarthritis, right knee 01/28/2018  . 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 01/28/2018  . Sacral back pain 10/16/2017  . Chronic left upper quadrant pain 07/23/2017  . Rectal bleeding 07/23/2017  . Poor balance 03/14/2017  . Right shoulder pain 03/14/2017  .  Angina pectoris ([place]) 03/22/2016  . Hypertensive heart disease 07/07/2015  . Essential hypertension 07/07/2015  . Plantar fasciitis, left 03/22/2015  . Coronary artery disease due to lipid rich plaque 01/05/2015  . Chronic diastolic CHF (congestive heart failure), NYHA class 2 ([place]) 01/05/2015  . 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 Migraine (Ocular) 01/05/2014  . VBI (vertebrobasilar insufficiency) 08/22/2012  . TIA (transient ischemic attack) 06/27/2012  . DJD (degenerative joint disease) 02/02/2011  . 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 Postherpetic neuralgia ? 01/02/2010  . Palpitations 11/23/2008  . UTI'S, RECURRENT 09/28/2008  . Fibromyalgia 08/15/2007  . DM II (diabetes  mellitus, type II), w/ neuropathy 05/21/2006  . Hyperlipidemia 05/21/2006  . Reactive airway disease 01/29/2002    Past Surgical History:  Procedure Laterality Date  . ABDOMINAL HYSTERECTOMY  1980   NO oophorectomy per pt   . ANTERIOR CERVICAL DECOMP/DISCECTOMY FUSION  2001   C 3, C4 and C5 plate and screws  . BREAST BIOPSY Right 1999  . BUNIONECTOMY Left ~ 1977  . CARDIAC CATHETERIZATION     2018 By Dr. Pernell Dupre (done after colon surgery)  . CATARACT EXTRACTION W/ INTRAOCULAR LENS  IMPLANT, BILATERAL  2012  . COLON SURGERY  10/2014  . EYE SURGERY Bilateral    torq lens for cataracts  . LEFT HEART CATH AND CORONARY ANGIOGRAPHY N/A 03/22/2016   Procedure: Left Heart Cath and Coronary Angiography;  Surgeon: Belva Crome, MD;  Location: [facility];  Service: Cardiovascular;  Laterality: N/A;  . LUMBAR LAMINECTOMY/DECOMPRESSION MICRODISCECTOMY N/A 01/20/2018   Procedure: L4-5 decompression;  Surgeon: Marybelle Killings, MD;  Location: [facility];  Service: Orthopedics;  Laterality: N/A;  . THYROIDECTOMY, PARTIAL  1960's  . VAGINAL MASS EXCISION  1994   "Laser surgery for vaginal cancer; followed by chemotherapy" (06/27/2012)     OB History    Gravida  2   Para      Term      Preterm      AB      Living        SAB      TAB      Ectopic      Multiple      Live Births              Family History  Problem Relation Age of Onset  . Heart disease Father   . Heart disease Mother   . Lung cancer Mother   . Allergies Sister   . Parkinsonism Sister        possible  . Asthma Sister   . Asthma Paternal Grandmother   . Stroke Paternal Grandmother   . Heart disease Other        paternal grandparents, maternal grandparents,   . Heart disease Brother   . Emphysema Brother   . Aneurysm Brother        x3  . Kidney failure Brother   . Diabetes Brother   . Diabetes Brother   . Stroke Brother   . Stroke Maternal Grandmother   . Breast cancer Neg Hx   . Colon  cancer Neg Hx   . Heart attack Neg Hx     Social History   Tobacco Use  . Smoking status: Former Smoker    Packs/day: 0.25    Years: 5.00    Pack years: 1.25    Types: Cigarettes    Quit date: 01/29/1998  Years since quitting: 21.0  . Smokeless tobacco: Never Used  . Tobacco comment: Quit in 2001  Substance Use Topics  . Alcohol use: No    Alcohol/week: 0.0 standard drinks  . Drug use: No    Home Medications Prior to Admission medications   Medication Sig Start Date End Date Taking? Authorizing Provider  acetaminophen (TYLENOL) 500 MG tablet Take 500 mg by mouth every 6 (six) hours as needed.    [provider]  albuterol (PROVENTIL HFA;VENTOLIN HFA) 108 (90 Base) MCG/ACT inhaler Inhale 2 puffs into the lungs every 6 (six) hours as needed for wheezing or shortness of breath. 02/07/17   [name], MD  budesonide-formoterol (SYMBICORT) 80-4.5 MCG/ACT inhaler Inhale 2 puffs into the lungs 2 (two) times daily. Patient taking differently: Inhale 2 puffs into the lungs 2 (two) times daily as needed (for shortness of breath or wheezing).  04/18/16   [name], MD  butalbital-acetaminophen-caffeine (FIORICET) [phone] MG tablet Take 1 tablet by mouth every 6 (six) hours as needed for headache or migraine. 01/14/17   [name], MD  BYSTOLIC 10 MG tablet TAKE 1 TABLET(10 MG) BY MOUTH DAILY 10/13/18   [name], MD  CALCIUM PO Take 1 tablet by mouth every morning.     [provider]  cetirizine (ZYRTEC) 10 MG tablet TAKE 1 TABLET(10 MG) BY MOUTH DAILY 01/12/19   [name], MD  cholecalciferol (VITAMIN D) 1000 UNITS tablet Take 1,000 Units by mouth every morning.     [provider]  clopidogrel (PLAVIX) 75 MG tablet TAKE 1 TABLET(75 MG) BY MOUTH DAILY 02/10/19   Burns, Claudina Lick, MD  Evolocumab (REPATHA SURECLICK) [id number] MG/ML SOAJ Inject 140 mg into the skin every 14 (fourteen) days. 05/06/18   [name], MD  hydrochlorothiazide  (HYDRODIURIL) 25 MG tablet Take 0.5 tablets (12.5 mg total) by mouth daily. 10/13/18   [name], MD  HYDROcodone-acetaminophen (NORCO/VICODIN) 5-325 MG tablet Take 1 tablet by mouth every 6 (six) hours as needed for moderate pain. Patient not taking: Reported on 01/13/2019 01/28/18   Marybelle Killings, MD  hydrocortisone 2.5 % cream Apply topically 2 (two) times daily. Patient taking differently: Apply 1 application topically 2 (two) times daily as needed (for rash).  03/17/15   Colon Branch, MD  hydroxychloroquine (PLAQUENIL) 200 MG tablet hydroxychloroquine 200 mg tablet    [provider]  isosorbide mononitrate (IMDUR) 60 MG 24 hr tablet TAKE 1 TABLET(60 MG) BY MOUTH DAILY 12/29/18   [name], MD  lisinopril (PRINIVIL,ZESTRIL) 40 MG tablet Take 1 tablet (40 mg total) by mouth daily. Patient taking differently: Take 20 mg by mouth daily.  08/02/17   [name], MD  metFORMIN (GLUCOPHAGE-XR) 500 MG 24 hr tablet TAKE 1 TABLET(500 MG) BY MOUTH DAILY WITH BREAKFAST 12/29/18   Burns, Claudina Lick, MD  Polyethyl Glycol-Propyl Glycol (SYSTANE) 0.4-0.3 % GEL Place 1 application into both eyes daily as needed (for dry eyes).     [provider]  Potassium 99 MG TABS Take 99 mg by mouth daily.     [provider]  pregabalin (LYRICA) 150 MG capsule Take 1 capsule (150 mg total) by mouth daily. 11/05/18   [name], MD  promethazine (PHENERGAN) 25 MG tablet Take 1 tablet (25 mg total) by mouth every 8 (eight) hours as needed for nausea or vomiting. 02/07/17   [name], MD    Allergies  Bactrim [sulfamethoxazole-trimethoprim], Cefuroxime axetil, Oxycodone, Pravastatin, Seldane [terfenadine], Zocor [simvastatin], Tramadol, Atorvastatin, Lime flavor [flavoring agent], Rosuvastatin, and Tape  Review of Systems   Review of Systems  Constitutional: Negative for chills and fever.  HENT: Negative for ear pain and sore throat.   Eyes: Negative for pain and  visual disturbance.  Respiratory: Negative for cough and shortness of breath.   Cardiovascular: Negative for chest pain and palpitations.  Gastrointestinal: Negative for abdominal pain and vomiting.  Genitourinary: Negative for dysuria and hematuria.  Musculoskeletal: Positive for arthralgias, back pain and myalgias.  Skin: Negative for color change and rash.  Neurological: Negative for seizures and syncope.  All other systems reviewed and are negative.   Physical Exam Updated Vital Signs BP (!) 161/76 (BP Location: Right Arm)   Pulse (!) 55   Temp 97.9 F (36.6 C) (Oral)   Resp (!) 23   SpO2 100%   Physical Exam Vitals and nursing note reviewed.  Constitutional:      General: She is not in acute distress.    Appearance: She is well-developed. She is obese. She is not ill-appearing, toxic-appearing or diaphoretic.  HENT:     Head: Normocephalic and atraumatic.     Right Ear: External ear normal.     Left Ear: External ear normal.     Nose: Nose normal. No congestion.     Mouth/Throat:     Mouth: Mucous membranes are moist.     Pharynx: Oropharynx is clear.  Eyes:     Conjunctiva/sclera: Conjunctivae normal.  Cardiovascular:     Rate and Rhythm: Normal rate and regular rhythm.     Heart sounds: No murmur.  Pulmonary:     Effort: Pulmonary effort is normal. No respiratory distress.     Breath sounds: Normal breath sounds.  Abdominal:     General: There is no distension.     Palpations: Abdomen is soft. There is no mass.     Tenderness: There is no abdominal tenderness. There is no right CVA tenderness, guarding or rebound.     Hernia: No hernia is present.  Musculoskeletal:        General: Tenderness present. No swelling or deformity.     Cervical back: Normal range of motion and neck supple. No rigidity or tenderness.     Comments: TTP right paraspinal area No weakness noted in lower ext Sensation intact Intact DTR's   Skin:    General: Skin is warm and dry.      Capillary Refill: Capillary refill takes less than 2 seconds.  Neurological:     General: No focal deficit present.     Mental Status: She is alert and oriented to person, place, and time.  Psychiatric:        Mood and Affect: Mood normal.        Behavior: Behavior normal.     ED Results / Procedures / Treatments   Labs (all labs ordered are listed, but only abnormal results are displayed) Labs Reviewed  URINALYSIS, ROUTINE W REFLEX MICROSCOPIC - Abnormal; Notable for the following components:      Result Value   Color, Urine STRAW (*)    APPearance HAZY (*)    Leukocytes,Ua SMALL (*)    Bacteria, UA RARE (*)    All other components within normal limits  URINE CULTURE  GRAM STAIN  BASIC METABOLIC PANEL  CBC    EKG None  Radiology CT Renal Stone Study  Result Date: 02/12/2019 CLINICAL DATA:  Right-sided flank  pain EXAM: CT ABDOMEN AND PELVIS WITHOUT CONTRAST TECHNIQUE: Multidetector CT imaging of the abdomen and pelvis was performed following the standard protocol without IV contrast. COMPARISON:  07/20/2017, 10/16/2017 FINDINGS: Lower chest: Lung bases demonstrate no acute consolidation or pleural effusion. The heart size is within normal limits. Hepatobiliary: No calcified gallstone. No biliary dilatation. Small cyst in the left hepatic lobe. Pancreas: Unremarkable. No pancreatic ductal dilatation or surrounding inflammatory changes. Spleen: Normal in size without focal abnormality. Adrenals/Urinary Tract: Adrenal glands are normal. No hydronephrosis. 5.1 cm interpolar cyst on the right. No ureteral stone. Bladder normal. Cortical thinning at the upper pole of the right kidney Stomach/Bowel: The stomach is nonenlarged. No dilated small bowel. Postsurgical changes of the right colon. Sigmoid colon diverticular disease without acute inflammatory change Vascular/Lymphatic: Extensive aortic atherosclerosis without aneurysm. No significant adenopathy Reproductive: Status post  hysterectomy. No adnexal masses. Other: Negative for free air or free fluid. Musculoskeletal: Grade 1 anterolisthesis L4 on L5. Moderate severe diffuse degenerative change without acute osseous abnormality IMPRESSION: 1. No CT evidence for acute intra-abdominal or pelvic abnormality. Negative for nephrolithiasis, hydronephrosis or ureteral stone. 2. Sigmoid colon diverticular disease without acute inflammatory change. Electronically Signed   By: Donavan Foil M.D.   On: 02/12/2019 22:00    Procedures Procedures (including critical care time)  Medications Ordered in ED Medications  lidocaine (LIDODERM) 5 % 1 patch (1 patch Transdermal Patch Applied 02/12/19 2148)  cyclobenzaprine (FLEXERIL) tablet 5 mg (5 mg Oral Given 02/12/19 2149)  ketorolac (TORADOL) tablet 10 mg (10 mg Oral Given 02/12/19 2149)    ED Course  I have reviewed the triage vital signs and the nursing notes.  Pertinent labs & imaging results that were available during my care of the patient were reviewed by me and considered in my medical decision making (see chart for details).    MDM Rules/Calculators/A&P                      79 year old female presenting to the ED for right back pain.  On arrival hemodynamically stable and afebrile.  Well-appearing.  See physical above, patient has tenderness palpation along the right paraspinal area, consistent with likely musculoskeletal pain.  No flank tenderness noted, no CVA tenderness.  Abdomen soft and nontender throughout.  Given her age as well as history, CT scan was obtained for nephrolithiasis versus possible AAA.  CT scan reassuring with no signs of pyelonephritis, enlarging aorta.  Patient has cyst on right kidney but no sign of new mass or enlargement. Urine studies obtained which were noninfectious, CMP with normal kidney function, CBC reassuring as well with no leukocytosis, hemoglobin stable.  Doubt pyelo or cystitis, no red flag symptoms for cord compression, no need for  further imaging currently. Lidocaine patch was applied, patient given oral Toradol as well as Flexeril.  Likely MSK in nature, element of sciatica secondary to either herniated disc, possible strained muscle. Reassessment patient ambulating at her baseline, reporting that her pain is much better.  Patient will follow up with PCP as needed for symptoms.  Return precautions were given.  Discharged home in good condition.  The attending physician was present and available for all medical decision making and procedures related to this patient's care.  Final Clinical Impression(s) / ED Diagnoses Final diagnoses:  Acute right-sided low back pain with right-sided sciatica    Rx / DC Orders ED Discharge Orders    None       Kizzie Fantasia, MD 02/12/19  [place], MD 02/12/19 2249

## 2019-02-12 NOTE — ED Triage Notes (Signed)
Pt here from home with c/o right flank pain and burning upon urination , no discharge or abnormal bleeding , pt does have a cyst on the right kidney also

## 2019-02-12 NOTE — Telephone Encounter (Signed)
Patient is calling with severe R sided pain and back pain. Patient states she has started having some UTI symptoms and does have kidney cyst diagnosis. Due to severe pain- advised ED.  Reason for Disposition . [1] SEVERE pain (e.g., excruciating) AND [2] present > 1 hour  Answer Assessment - Initial Assessment Questions 1. LOCATION: "Where does it hurt?"      R back side and side in front 2. RADIATION: "Does the pain shoot anywhere else?" (e.g., chest, back)     R front- to back 3. ONSET: "When did the pain begin?" (e.g., minutes, hours or days ago)      Started  4. SUDDEN: "Gradual or sudden onset?"     sudden 5. PATTERN "Does the pain come and go, or is it constant?"    - If constant: "Is it getting better, staying the same, or worsening?"      (Note: Constant means the pain never goes away completely; most serious pain is constant and it progresses)     - If intermittent: "How long does it last?" "Do you have pain now?"     (Note: Intermittent means the pain goes away completely between bouts)     constant  6. SEVERITY: "How bad is the pain?"  (e.g., Scale 1-10; mild, moderate, or severe)   - MILD (1-3): doesn't interfere with normal activities, abdomen soft and not tender to touch    - MODERATE (4-7): interferes with normal activities or awakens from sleep, tender to touch    - SEVERE (8-10): excruciating pain, doubled over, unable to do any normal activities      severe 7. RECURRENT SYMPTOM: "Have you ever had this type of abdominal pain before?" If so, ask: "When was the last time?" and "What happened that time?"      no- not this bad 8. CAUSE: "What do you think is causing the abdominal pain?"     Kidney cyst-UTI 9. RELIEVING/AGGRAVATING FACTORS: "What makes it better or worse?" (e.g., movement, antacids, bowel movement)     No better 10. OTHER SYMPTOMS: "Has there been any vomiting, diarrhea, constipation, or urine problems?"       Urinary symptoms, diarrhea last night 11.  PREGNANCY: "Is there any chance you are pregnant?" "When was your last menstrual period?"       n/a  Protocols used: ABDOMINAL PAIN - [facility]

## 2019-02-12 NOTE — ED Notes (Signed)
wasted 5mg  flexeril with elizabeth rn

## 2019-02-12 NOTE — Discharge Instructions (Addendum)
You were seen in the emergency department for right sided back pain. Your labs and imaging were reassuring, kidney function stable and blood counts reassuring. Please see your PCP for further symptoms, most likely your pain is musculoskeletal. Please use tylenol/motrin as needed. Return to ED for fevers, inability to walk, vomiting, abdominal pain or worsening back pain or decreased sensation in your bottom/legs.

## 2019-02-15 LAB — URINE CULTURE: Culture: 80000 — AB

## 2019-02-16 ENCOUNTER — Telehealth: Payer: Self-pay | Admitting: *Deleted

## 2019-02-16 NOTE — Progress Notes (Signed)
ED Antimicrobial Stewardship Positive Culture Follow Up   Samantha [name] is an 79 y.o. female who presented to [facility] on 02/12/2019 with a chief complaint of  Chief Complaint  Patient presents with  . Flank Pain    Recent Results (from the past 720 hour(s))  Novel Coronavirus, NAA ([facility])     Status: None   Collection Time: 01/27/19  8:46 AM   Specimen: Nasopharyngeal(NP) swabs in vial transport medium   NASOPHARYNGE  TESTING  Result Value Ref Range Status   SARS-CoV-2, NAA Not Detected Not Detected Final    Comment: This nucleic acid amplification test was developed and its performance characteristics determined by [employer]. Nucleic acid amplification tests include PCR and TMA. This test has not been FDA cleared or approved. This test has been authorized by FDA under an Emergency Use Authorization (EUA). This test is only authorized for the duration of time the declaration that circumstances exist justifying the authorization of the emergency use of in vitro diagnostic tests for detection of SARS-CoV-2 virus and/or diagnosis of COVID-19 infection under section 564(b)(1) of the Act, 21 U.S.C. PT:2852782) (1), unless the authorization is terminated or revoked sooner. When diagnostic testing is negative, the possibility of a false negative result should be considered in the context of a patient's recent exposures and the presence of clinical signs and symptoms consistent with COVID-19. An individual without symptoms of COVID-19 and who is not shedding SARS-CoV-2 virus would  expect to have a negative (not detected) result in this assay.   Urine culture     Status: Abnormal   Collection Time: 02/12/19  7:40 AM   Specimen: Urine, Clean Catch  Result Value Ref Range Status   Specimen Description URINE, CLEAN CATCH  Final   Special Requests   Final    NONE Performed at [facility], [address] [place], [place] [address]    Culture 80,000  COLONIES/mL ESCHERICHIA COLI (A)  Final   Report Status 02/15/2019 FINAL  Final   Organism ID, Bacteria ESCHERICHIA COLI (A)  Final      Susceptibility   Escherichia coli - MIC*    AMPICILLIN 4 SENSITIVE Sensitive     CEFAZOLIN <=4 SENSITIVE Sensitive     CEFTRIAXONE <=0.25 SENSITIVE Sensitive     CIPROFLOXACIN <=0.25 SENSITIVE Sensitive     GENTAMICIN <=1 SENSITIVE Sensitive     IMIPENEM <=0.25 SENSITIVE Sensitive     NITROFURANTOIN <=16 SENSITIVE Sensitive     TRIMETH/SULFA <=20 SENSITIVE Sensitive     AMPICILLIN/SULBACTAM <=2 SENSITIVE Sensitive     PIP/TAZO <=4 SENSITIVE Sensitive     * 80,000 COLONIES/mL ESCHERICHIA COLI    []  Treated with N/A, organism resistant to prescribed antimicrobial [x]  Patient discharged originally without antimicrobial agent and treatment is now indicated  New antibiotic prescription: If pt with UTI symptoms, start amoxicillin 875mg  PO BID x 5 days  ED Provider: Arlean Hopping, PA   [place], Rande Lawman 02/16/2019, 9:07 AM Clinical Pharmacist Phone: [phone]

## 2019-02-16 NOTE — Telephone Encounter (Signed)
Post ED Visit - Positive Culture Follow-up  Culture report reviewed by antimicrobial stewardship pharmacist: [place] Team []  Elenor Quinones, Pharm.D. []  Heide Guile, Pharm.D., BCPS AQ-ID []  Parks Neptune, Pharm.D., BCPS []  Alycia Rossetti, Pharm.D., BCPS []  [place], Pharm.D., BCPS, AAHIVP []  Legrand Como, Pharm.D., BCPS, AAHIVP [x]  Salome Arnt, PharmD, BCPS []  Johnnette Gourd, PharmD, BCPS []  Hughes Better, PharmD, BCPS []  Leeroy Cha, PharmD []  Laqueta Linden, PharmD, BCPS []  Albertina Parr, PharmD  [place] Team []  Leodis Sias, PharmD []  Lindell Spar, PharmD []  Royetta Asal, PharmD []  Graylin Shiver, Rph []  Rema Fendt) Glennon Mac, PharmD []  Arlyn Dunning, PharmD []  Netta Cedars, PharmD []  Dia Sitter, PharmD []  Leone Haven, PharmD []  Gretta Arab, PharmD []  Theodis Shove, PharmD []  Peggyann Juba, PharmD []  Reuel Boom, PharmD   Positive urine culture Contacted pt who states she remains asymptomatic for UTI and no further patient follow-up is required at this time.  [facility] 02/16/2019, 9:29 AM

## 2019-02-17 ENCOUNTER — Encounter: Payer: Self-pay | Admitting: Gastroenterology

## 2019-02-17 ENCOUNTER — Ambulatory Visit (INDEPENDENT_AMBULATORY_CARE_PROVIDER_SITE_OTHER): Payer: Medicare Other | Admitting: Gastroenterology

## 2019-02-17 ENCOUNTER — Telehealth: Payer: Self-pay

## 2019-02-17 VITALS — BP 130/78 | HR 68 | Temp 98.0°F | Ht 62.0 in | Wt 191.5 lb

## 2019-02-17 DIAGNOSIS — Z01818 Encounter for other preprocedural examination: Secondary | ICD-10-CM | POA: Diagnosis not present

## 2019-02-17 DIAGNOSIS — R197 Diarrhea, unspecified: Secondary | ICD-10-CM | POA: Diagnosis not present

## 2019-02-17 DIAGNOSIS — Z85038 Personal history of other malignant neoplasm of large intestine: Secondary | ICD-10-CM | POA: Diagnosis not present

## 2019-02-17 MED ORDER — CHOLESTYRAMINE 4 G PO PACK: 4.0000 g | PACK | Freq: Every day | ORAL | 11 refills | Status: DC

## 2019-02-17 MED ORDER — SUPREP BOWEL PREP KIT 17.5-3.13-1.6 GM/177ML PO SOLN: 1.0000 | ORAL | 0 refills | Status: DC

## 2019-02-17 NOTE — Telephone Encounter (Signed)
[facility] Pre-operative Risk Assessment     Request for surgical clearance:     Endoscopy Procedure  What type of surgery is being performed?     Colonoscopy  When is this surgery scheduled?     03/02/2019  What type of clearance is required ?   Pharmacy  Are there any medications that need to be held prior to surgery and how long? Plavix x 5 days  Practice name and name of physician performing surgery?      [facility]  What is your office phone and fax number?      Phone- [phone]  [phone]  Anesthesia type (None, local, MAC, general) ?       MAC

## 2019-02-17 NOTE — Telephone Encounter (Signed)
Patient advised that she can hold Plavix 5 days prior to colonoscopy on 03/02/2019.  She will take last dose of Plavix on 02/24/2019.  Patient agreed to plan and verbalized understanding.  No further questions.

## 2019-02-17 NOTE — Telephone Encounter (Signed)
   Primary Cardiologist: [name].  Chart reviewed as part of pre-operative protocol coverage. Given past medical history and time since last visit, based on ACC/AHA guidelines, Samantha [name] would be at acceptable risk for the planned procedure without further cardiovascular testing.    She can hold Plavix for a week prior to her procedure and restart as soon as acceptable from surgical standpoint.  She has a RCRI class II risk, 0.9% risk of major cardiac event.  I will route this recommendation to the requesting party via Epic fax function and remove from pre-op pool.  Please call with questions.  Jossie Ng. [facility] [place] [address] Office [phone] Fax [phone]

## 2019-02-17 NOTE — Patient Instructions (Addendum)
If you are age 77 or older, your body mass index should be between 23-30. Your Body mass index is 35.03 kg/m. If this is out of the aforementioned range listed, please consider follow up with your Primary Care Provider.  If you are age 60 or younger, your body mass index should be between 19-25. Your Body mass index is 35.03 kg/m. If this is out of the aformentioned range listed, please consider follow up with your Primary Care Provider.   We have sent the following medications to your pharmacy for you to pick up at your convenience:  START: cholestyramine 4 gram packet once daily  You have been scheduled for a colonoscopy. Please follow written instructions given to you at your visit today.  Please pick up your prep supplies at the pharmacy within the next 1-3 days. If you use inhalers (even only as needed), please bring them with you on the day of your procedure.    Thank you, Dr Ardis Hughs

## 2019-02-17 NOTE — Progress Notes (Signed)
Review of pertinent gastrointestinal problems: 1. Colon cancer;colonoscopy [name] 7 2016 done for minor rectal bleeding found a small descending colon firm polypoid lesion. This was resected with snare cautery and confirmed adenocarcinoma. She underwent robot-assisted right colectomy with [name] October 2016 and the pathologic specimen prove that there was no residual adenocarcinoma. This was a T1, N0 M0 right-sided colon cancer.  Colonoscopy January 2018 normal right sided anastomosis, single 3 mm polyp was found and removed.  It was adenomatous on biopsy.  She was recommended to have repeat colonoscopy at 3-year interval. 2. 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   HPI: This is a very pleasant 79 year old woman whom I last saw at the time of her January 2018 colonoscopy.  See those results summarized above  She tells me that she ran out of cholestyramine and since then she has had significant loose stools with just about every meal.  She will have 4 or 5 loose stools every day, generally following a meal.  She was previously much better while taking cholestyramine on a daily basis.  She has minor bright red blood on toilet paper with wiping.  This has been an issue for her for many years.  It has not changed.  No serious abdominal pains.  Her weight is overall stable  She is on Plavix for history of TIAs, atrial fibrillation.  ROS: complete GI ROS as described in HPI, all other review negative.  Constitutional:  No unintentional weight loss   Past Medical History:  Diagnosis Date  . Abnormal CT of the chest 2008   last CT4-l 2009:  . No f/u suggested   . Allergy   . Asthma   . CAD (coronary artery disease)    a. Coronary CTA 10/16: Coronary Ca score 211, mod non-obstructive CAD with LM mild plaque (25-50%), mid LAD 50-69%. b. Neg nuc 06/2015.  . Cataract    BILATERAL-REMOVED  . Chronic diastolic CHF (congestive heart  failure) ([place])   . Collagen vascular disease ([place])    "arterial sclerosis" per pt  . Complication of anesthesia    trouble waking up  . Fibromyalgia   . GERD (gastroesophageal reflux disease)   . H/O hiatal hernia   . Heart murmur   . Hyperlipidemia   . Hypertension   . Malignant neoplasm of ascending colon ([place]) 2016   Minimally invasive right hemicolectomy to be done   . Neuromuscular disorder ([facility])    FIBROMYALGIA  . Ocular migraine   . OSA (obstructive sleep apnea) 09/2007   dx w/ a sleep study, not on  CPAP  . Osteoarthritis   . Osteoporosis   . Pneumonia    "double" in 2004  . PONV (postoperative nausea and vomiting)   . Reactive airway disease 01/29/2002   dx of pseudoasthma / vcd in 2005 and nl sprirometry History of dyspnea, 2011,  improved after several medications were changed around Question of COPD, disproved July 06, 2009 with nl pft's      . Rheumatoid factor positive   . Shingles 11/2009  . Sleep apnea   . Stroke ([place])   . TIA (transient ischemic attack)    x2 - on Plavix for this  . Torn rotator cuff    right worse than left, both are torn  . Tumor, 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  . Type II diabetes mellitus ([place])   . 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 Vaginal dysplasia  Past Surgical History:  Procedure Laterality Date  . ABDOMINAL HYSTERECTOMY  1980   NO oophorectomy per pt   . ANTERIOR CERVICAL DECOMP/DISCECTOMY FUSION  2001   C 3, C4 and C5 plate and screws  . BREAST BIOPSY Right 1999  . BUNIONECTOMY Left ~ 1977  . CARDIAC CATHETERIZATION     2018 By Dr. Pernell Dupre (done after colon surgery)  . CATARACT EXTRACTION W/ INTRAOCULAR LENS  IMPLANT, BILATERAL  2012  . COLON SURGERY  10/2014  . EYE SURGERY Bilateral    torq lens for cataracts  . LEFT HEART CATH AND CORONARY ANGIOGRAPHY N/A 03/22/2016   Procedure: Left Heart Cath and Coronary Angiography;  Surgeon: Belva Crome, MD;  Location: [facility];  Service: Cardiovascular;   Laterality: N/A;  . LUMBAR LAMINECTOMY/DECOMPRESSION MICRODISCECTOMY N/A 01/20/2018   Procedure: L4-5 decompression;  Surgeon: Marybelle Killings, MD;  Location: [facility];  Service: Orthopedics;  Laterality: N/A;  . THYROIDECTOMY, PARTIAL  1960's  . VAGINAL MASS EXCISION  1994   "Laser surgery for vaginal cancer; followed by chemotherapy" (06/27/2012)    Current Outpatient Medications  Medication Sig Dispense Refill  . acetaminophen (TYLENOL) 500 MG tablet Take 500 mg by mouth every 6 (six) hours as needed.    [name] albuterol (PROVENTIL HFA;VENTOLIN HFA) 108 (90 Base) MCG/ACT inhaler Inhale 2 puffs into the lungs every 6 (six) hours as needed for wheezing or shortness of breath. 18 g 3  . budesonide-formoterol (SYMBICORT) 80-4.5 MCG/ACT inhaler Inhale 2 puffs into the lungs 2 (two) times daily. (Patient taking differently: Inhale 2 puffs into the lungs 2 (two) times daily as needed (for shortness of breath or wheezing). ) 1 Inhaler 3  . butalbital-acetaminophen-caffeine (FIORICET) 50-325-40 MG tablet Take 1 tablet by mouth every 6 (six) hours as needed for headache or migraine. 30 tablet 5  . BYSTOLIC 10 MG tablet TAKE 1 TABLET(10 MG) BY MOUTH DAILY 90 tablet 1  . CALCIUM PO Take 1 tablet by mouth every morning.     . cetirizine (ZYRTEC) 10 MG tablet TAKE 1 TABLET(10 MG) BY MOUTH DAILY 90 tablet 2  . cholecalciferol (VITAMIN D) 1000 UNITS tablet Take 1,000 Units by mouth every morning.     . clopidogrel (PLAVIX) 75 MG tablet TAKE 1 TABLET(75 MG) BY MOUTH DAILY 90 tablet 1  . Evolocumab (REPATHA SURECLICK) [id number] MG/ML SOAJ Inject 140 mg into the skin every 14 (fourteen) days. 2 pen 11  . hydrochlorothiazide (HYDRODIURIL) 25 MG tablet Take 0.5 tablets (12.5 mg total) by mouth daily. 45 tablet 1  . HYDROcodone-acetaminophen (NORCO/VICODIN) 5-325 MG tablet Take 1 tablet by mouth every 6 (six) hours as needed for moderate pain. 30 tablet 0  . hydrocortisone 2.5 % cream Apply topically 2 (two) times daily. (Patient  taking differently: Apply 1 application topically 2 (two) times daily as needed (for rash). ) 30 g 0  . hydroxychloroquine (PLAQUENIL) 200 MG tablet hydroxychloroquine 200 mg tablet    . isosorbide mononitrate (IMDUR) 60 MG 24 hr tablet TAKE 1 TABLET(60 MG) BY MOUTH DAILY 90 tablet 3  . lisinopril (PRINIVIL,ZESTRIL) 40 MG tablet Take 1 tablet (40 mg total) by mouth daily. (Patient taking differently: Take 20 mg by mouth daily. ) 90 tablet 1  . metFORMIN (GLUCOPHAGE-XR) 500 MG 24 hr tablet TAKE 1 TABLET(500 MG) BY MOUTH DAILY WITH BREAKFAST 90 tablet 1  . Polyethyl Glycol-Propyl Glycol (SYSTANE) 0.4-0.3 % GEL Place 1 application into both eyes daily as needed (for dry eyes).     [name]  Potassium 99 MG TABS Take 99 mg by mouth daily.     . pregabalin (LYRICA) 150 MG capsule Take 1 capsule (150 mg total) by mouth daily. 30 capsule 0  . promethazine (PHENERGAN) 25 MG tablet Take 1 tablet (25 mg total) by mouth every 8 (eight) hours as needed for nausea or vomiting. 20 tablet 0   No current facility-administered medications for this visit.    Allergies as of 02/17/2019 - Review Complete 02/17/2019  Allergen Reaction Noted  . Bactrim [sulfamethoxazole-trimethoprim] Other (See Comments) 09/15/2013  . Cefuroxime axetil Hives 02/07/2006  . Oxycodone Nausea And Vomiting 12/07/2010  . Pravastatin Other (See Comments) 04/02/2013  . Seldane [terfenadine] Hives 02/07/2006  . Zocor [simvastatin] Other (See Comments) 01/23/2013  . Tramadol Nausea And Vomiting 10/05/2009  . Atorvastatin Other (See Comments) 04/17/2018  . Lime flavor [flavoring agent] Diarrhea 07/20/2017  . Rosuvastatin Other (See Comments) 05/02/2018  . Tape Other (See Comments) 03/27/2016    Family History  Problem Relation Age of Onset  . Heart disease Father   . Heart disease Mother   . Lung cancer Mother   . Allergies Sister   . Parkinsonism Sister        possible  . Asthma Sister   . Asthma Paternal Grandmother   . Stroke  Paternal Grandmother   . Heart disease Other        paternal grandparents, maternal grandparents,   . Heart disease Brother   . Emphysema Brother   . Aneurysm Brother        x3  . Kidney failure Brother   . Diabetes Brother   . Diabetes Brother   . Stroke Brother   . Stroke Maternal Grandmother   . Breast cancer Neg Hx   . Colon cancer Neg Hx   . Heart attack Neg Hx     Social History   Socioeconomic History  . Marital status: Married    Spouse name: Ilona Sorrel  . Number of children: 2  . Years of education: masters  . Highest education level: Not on file  Occupational History  . Occupation: Retired, disable since 2000    Employer: RETIRED  Tobacco Use  . Smoking status: Former Smoker    Packs/day: 0.25    Years: 5.00    Pack years: 1.25    Types: Cigarettes    Quit date: 01/29/1998    Years since quitting: 21.0  . Smokeless tobacco: Never Used  . Tobacco comment: Quit in 2001  Substance and Sexual Activity  . Alcohol use: No    Alcohol/week: 0.0 standard drinks  . Drug use: No  . Sexual activity: Never  Other Topics Concern  . Not on file  Social History Narrative   On disability since 2000--- also husband has MS   Education. College.   Right handed.   Social Determinants of Health   Financial Resource Strain:   . Difficulty of Paying Living Expenses: Not on file  Food Insecurity:   . Worried About Charity fundraiser in the Last Year: Not on file  . Ran Out of Food in the Last Year: Not on file  Transportation Needs:   . Lack of Transportation (Medical): Not on file  . Lack of Transportation (Non-Medical): Not on file  Physical Activity: Sufficiently Active  . Days of Exercise per Week: 5 days  . Minutes of Exercise per Session: 30 min  Stress: Stress Concern Present  . Feeling of Stress : To some extent  Social Connections:   .  Frequency of Communication with Friends and Family: Not on file  . Frequency of Social Gatherings with Friends and Family: Not  on file  . Attends Religious Services: Not on file  . Active Member of Clubs or Organizations: Not on file  . Attends Archivist Meetings: Not on file  . Marital Status: Not on file  Intimate Partner Violence:   . Fear of Current or Ex-Partner: Not on file  . Emotionally Abused: Not on file  . Physically Abused: Not on file  . Sexually Abused: Not on file     Physical Exam: BP 130/78   Pulse 68   Temp 98 F (36.7 C)   Ht 5\' 2"  (1.575 m)   Wt 191 lb 8 oz (86.9 kg)   BMI 35.03 kg/m  Constitutional: generally well-appearing Psychiatric: alert and oriented x3 Abdomen: soft, nontender, nondistended, no obvious ascites, no peritoneal signs, normal bowel sounds No peripheral edema noted in lower extremities  Assessment and plan: 79 y.o. female with history of colon cancer, bile acid related diarrhea, ongoing Plavix use  I recommended repeat colonoscopy for her at her soonest convenience which is protocol following removal of a colon cancer.  Her last examination was 3 years ago.  She is on Plavix for history of A. fib and TIAs.  I recommended that she stop the Plavix for 5 days prior to the colonoscopy.  We will communicate with her cardiologist about the safety of that recommendation.  She has well-documented bile acid related diarrhea which was well controlled previously on cholestyramine.  I would restart that for her and she knows that when she runs out of that prescription I am happy to refill it.  She has small amount of blood on tissue paper when wiping.  This sounds hemorrhoidal.  Certainly ongoing Plavix use will contribute.  I will evaluate the time of colonoscopy.  Please see the "Patient Instructions" section for addition details about the plan.  Owens Loffler, MD [facility] 02/17/2019, 10:23 AM

## 2019-02-17 NOTE — Telephone Encounter (Signed)
Yes she can hold Plavix for a week prior to her procedure and restart as soon as acceptable from surgical standpoint.

## 2019-02-27 ENCOUNTER — Telehealth: Payer: Self-pay

## 2019-02-27 NOTE — Telephone Encounter (Signed)
A low temperature is the same as having a fever so her low temperature should be treated as a possible fever.  She should be evaluated for possible infection.  Since it is Friday afternoon she either needs to be seen somewhere, such as urgent care over the weekend or wait until Monday.

## 2019-02-27 NOTE — Telephone Encounter (Signed)
Patient calling and states that she had a dental appointment this morning and her temperature was 95.31F. States that she had a colonoscopy scheduled for Monday 03/02/2019, but has postponed that due to having issues with her body temperature. States that her hands a turning white and she tried to put them in warm water, but that is too painful. States that she had an appointment with Rheumatology on 03/04/2019. Would like to know if she would like Dr Quay Burow to continue caring for this issue or if she would like Rheumatology to take over? Please advise.  States that she is also feeling very fatigued.

## 2019-02-27 NOTE — Telephone Encounter (Signed)
Pt states she will go get evaluated in the morning.

## 2019-02-28 ENCOUNTER — Ambulatory Visit (HOSPITAL_COMMUNITY)
Admission: EM | Admit: 2019-02-28 | Discharge: 2019-02-28 | Disposition: A | Discharge status: Home / Self Care | Payer: Medicare Other | Attending: Urgent Care | Admitting: Urgent Care

## 2019-02-28 ENCOUNTER — Other Ambulatory Visit: Payer: Self-pay

## 2019-02-28 ENCOUNTER — Encounter (HOSPITAL_COMMUNITY): Payer: Self-pay

## 2019-02-28 DIAGNOSIS — M069 Rheumatoid arthritis, unspecified: Secondary | ICD-10-CM

## 2019-02-28 DIAGNOSIS — M79641 Pain in right hand: Secondary | ICD-10-CM

## 2019-02-28 DIAGNOSIS — R350 Frequency of micturition: Secondary | ICD-10-CM

## 2019-02-28 DIAGNOSIS — E119 Type 2 diabetes mellitus without complications: Secondary | ICD-10-CM

## 2019-02-28 DIAGNOSIS — N3090 Cystitis, unspecified without hematuria: Secondary | ICD-10-CM

## 2019-02-28 MED ORDER — CIPROFLOXACIN HCL 500 MG PO TABS: 500.0000 mg | ORAL_TABLET | Freq: Two times a day (BID) | ORAL | 0 refills | Status: DC

## 2019-02-28 MED ORDER — PREDNISONE 20 MG PO TABS: ORAL_TABLET | ORAL | 0 refills | Status: DC

## 2019-02-28 NOTE — ED Provider Notes (Signed)
[facility]   MRN: [id number] DOB: Jun 27, 1940  Subjective:   Samantha [name] is a 79 y.o. female with past medical history of rheumatoid arthritis, osteoarthritis, diabetes and recurrent UTIs presenting for persistent urinary frequency the past 2 weeks and several day history of bilateral hand pain and cold sensation.  Patient was seen in the emergency room 02/12/2019, had a urine culture done that showed an E. coli infection.  Patient states she was unaware of this and would like to get treated for UTI as she is still having urinary frequency.  Denies fever, nausea, vomiting, pelvic or abdominal pain, dysuria, hematuria.  Regarding her hands, states that they become really pale and at times has locking of her fingers in a flexed position.  She does have an orthopedist that she plans on following up with.  She is going to follow-up with her rheumatologist this week. Takes hydroxychloroquine (per patient) for her RA.   No current facility-administered medications for this encounter.  Current Outpatient Medications:  .  acetaminophen (TYLENOL) 500 MG tablet, Take 500 mg by mouth every 6 (six) hours as needed., Disp: , Rfl:  .  albuterol (PROVENTIL HFA;VENTOLIN HFA) 108 (90 Base) MCG/ACT inhaler, Inhale 2 puffs into the lungs every 6 (six) hours as needed for wheezing or shortness of breath., Disp: 18 g, Rfl: 3 .  budesonide-formoterol (SYMBICORT) 80-4.5 MCG/ACT inhaler, Inhale 2 puffs into the lungs 2 (two) times daily. (Patient taking differently: Inhale 2 puffs into the lungs 2 (two) times daily as needed (for shortness of breath or wheezing). ), Disp: 1 Inhaler, Rfl: 3 .  butalbital-acetaminophen-caffeine (FIORICET) 50-325-40 MG tablet, Take 1 tablet by mouth every 6 (six) hours as needed for headache or migraine., Disp: 30 tablet, Rfl: 5 .  BYSTOLIC 10 MG tablet, TAKE 1 TABLET(10 MG) BY MOUTH DAILY, Disp: 90 tablet, Rfl: 1 .  CALCIUM PO, Take 1 tablet by mouth every morning. , Disp: ,  Rfl:  .  cetirizine (ZYRTEC) 10 MG tablet, TAKE 1 TABLET(10 MG) BY MOUTH DAILY, Disp: 90 tablet, Rfl: 2 .  cholecalciferol (VITAMIN D) 1000 UNITS tablet, Take 1,000 Units by mouth every morning. , Disp: , Rfl:  .  cholestyramine (QUESTRAN) 4 g packet, Take 1 packet (4 g total) by mouth daily., Disp: 30 packet, Rfl: 11 .  clopidogrel (PLAVIX) 75 MG tablet, TAKE 1 TABLET(75 MG) BY MOUTH DAILY, Disp: 90 tablet, Rfl: 1 .  Evolocumab (REPATHA SURECLICK) 379 MG/ML SOAJ, Inject 140 mg into the skin every 14 (fourteen) days., Disp: 2 pen, Rfl: 11 .  hydrochlorothiazide (HYDRODIURIL) 25 MG tablet, Take 0.5 tablets (12.5 mg total) by mouth daily., Disp: 45 tablet, Rfl: 1 .  HYDROcodone-acetaminophen (NORCO/VICODIN) 5-325 MG tablet, Take 1 tablet by mouth every 6 (six) hours as needed for moderate pain., Disp: 30 tablet, Rfl: 0 .  hydrocortisone 2.5 % cream, Apply topically 2 (two) times daily. (Patient taking differently: Apply 1 application topically 2 (two) times daily as needed (for rash). ), Disp: 30 g, Rfl: 0 .  hydroxychloroquine (PLAQUENIL) 200 MG tablet, hydroxychloroquine 200 mg tablet, Disp: , Rfl:  .  isosorbide mononitrate (IMDUR) 60 MG 24 hr tablet, TAKE 1 TABLET(60 MG) BY MOUTH DAILY, Disp: 90 tablet, Rfl: 3 .  lisinopril (PRINIVIL,ZESTRIL) 40 MG tablet, Take 1 tablet (40 mg total) by mouth daily. (Patient taking differently: Take 20 mg by mouth daily. ), Disp: 90 tablet, Rfl: 1 .  metFORMIN (GLUCOPHAGE-XR) 500 MG 24 hr tablet, TAKE 1 TABLET(500 MG) BY  MOUTH DAILY WITH BREAKFAST, Disp: 90 tablet, Rfl: 1 .  Na Sulfate-K Sulfate-Mg Sulf (SUPREP BOWEL PREP KIT) 17.5-3.13-1.6 GM/177ML SOLN, Take 1 kit by mouth as directed., Disp: 324 mL, Rfl: 0 .  Polyethyl Glycol-Propyl Glycol (SYSTANE) 0.4-0.3 % GEL, Place 1 application into both eyes daily as needed (for dry eyes). , Disp: , Rfl:  .  Potassium 99 MG TABS, Take 99 mg by mouth daily. , Disp: , Rfl:  .  pregabalin (LYRICA) 150 MG capsule, Take 1  capsule (150 mg total) by mouth daily., Disp: 30 capsule, Rfl: 0 .  promethazine (PHENERGAN) 25 MG tablet, Take 1 tablet (25 mg total) by mouth every 8 (eight) hours as needed for nausea or vomiting., Disp: 20 tablet, Rfl: 0   Allergies  Allergen Reactions  . Bactrim [Sulfamethoxazole-Trimethoprim] Other (See Comments)    See OV 09-15-13, rash-tongue swelling due to bactrim ?  [name] Cefuroxime Axetil Hives  . Oxycodone Nausea And Vomiting  . Pravastatin Other (See Comments)    "muscle breakdown" with profuse sweating  . Seldane [Terfenadine] Hives  . Zocor [Simvastatin] Other (See Comments)    2012 "Muscle breakdown " with profuse sweating  . Tramadol Nausea And Vomiting  . Atorvastatin Other (See Comments)    Pt reports muscle aches and joint pains  . Lime Flavor [Flavoring Agent] Diarrhea  . Rosuvastatin Other (See Comments)    Pt reports "myalgias, fatigue, nosebleeds."  . Tape Other (See Comments)    blisters    Past Medical History:  Diagnosis Date  . Abnormal CT of the chest 2008   last CT4-l 2009:  . No f/u suggested   . Allergy   . Asthma   . CAD (coronary artery disease)    a. Coronary CTA 10/16: Coronary Ca score 211, mod non-obstructive CAD with LM mild plaque (25-50%), mid LAD 50-69%. b. Neg nuc 06/2015.  . Cataract    BILATERAL-REMOVED  . Chronic diastolic CHF (congestive heart failure) ([place])   . Collagen vascular disease ([place])    "arterial sclerosis" per pt  . Complication of anesthesia    trouble waking up  . Fibromyalgia   . GERD (gastroesophageal reflux disease)   . H/O hiatal hernia   . Heart murmur   . Hyperlipidemia   . Hypertension   . Malignant neoplasm of ascending colon ([place]) 2016   Minimally invasive right hemicolectomy to be done   . Neuromuscular disorder (HCC)    FIBROMYALGIA  . Ocular migraine   . OSA (obstructive sleep apnea) 09/2007   dx w/ a sleep study, not on  CPAP  . Osteoarthritis   . Osteoporosis   . Pneumonia    "double" in 2004    . PONV (postoperative nausea and vomiting)   . Reactive airway disease 01/29/2002   dx of pseudoasthma / vcd in 2005 and nl sprirometry History of dyspnea, 2011,  improved after several medications were changed around Question of COPD, disproved July 06, 2009 with nl pft's      . Rheumatoid factor positive   . Shingles 11/2009  . Sleep apnea   . Stroke ([place])   . TIA (transient ischemic attack)    x2 - on Plavix for this  . Torn rotator cuff    right worse than left, both are torn  . Tumor, 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  . Type II diabetes mellitus ([place])   . 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 Vaginal dysplasia  Past Surgical History:  Procedure Laterality Date  . ABDOMINAL HYSTERECTOMY  1980   NO oophorectomy per pt   . ANTERIOR CERVICAL DECOMP/DISCECTOMY FUSION  2001   C 3, C4 and C5 plate and screws  . BREAST BIOPSY Right 1999  . BUNIONECTOMY Left ~ 1977  . CARDIAC CATHETERIZATION     2018 By Dr. Pernell Dupre (done after colon surgery)  . CATARACT EXTRACTION W/ INTRAOCULAR LENS  IMPLANT, BILATERAL  2012  . COLON SURGERY  10/2014  . EYE SURGERY Bilateral    torq lens for cataracts  . LEFT HEART CATH AND CORONARY ANGIOGRAPHY N/A 03/22/2016   Procedure: Left Heart Cath and Coronary Angiography;  Surgeon: Belva Crome, MD;  Location: [facility];  Service: Cardiovascular;  Laterality: N/A;  . LUMBAR LAMINECTOMY/DECOMPRESSION MICRODISCECTOMY N/A 01/20/2018   Procedure: L4-5 decompression;  Surgeon: Marybelle Killings, MD;  Location: [facility];  Service: Orthopedics;  Laterality: N/A;  . THYROIDECTOMY, PARTIAL  1960's  . VAGINAL MASS EXCISION  1994   "Laser surgery for vaginal cancer; followed by chemotherapy" (06/27/2012)    Family History  Problem Relation Age of Onset  . Heart disease Father   . Heart disease Mother   . Lung cancer Mother   . Allergies Sister   . Parkinsonism Sister        possible  . Asthma Sister   . Asthma Paternal Grandmother   . Stroke  Paternal Grandmother   . Heart disease Other        paternal grandparents, maternal grandparents,   . Heart disease Brother   . Emphysema Brother   . Aneurysm Brother        x3  . Kidney failure Brother   . Diabetes Brother   . Diabetes Brother   . Stroke Brother   . Stroke Maternal Grandmother   . Breast cancer Neg Hx   . Colon cancer Neg Hx   . Heart attack Neg Hx     Social History   Tobacco Use  . Smoking status: Former Smoker    Packs/day: 0.25    Years: 5.00    Pack years: 1.25    Types: Cigarettes    Quit date: 01/29/1998    Years since quitting: 21.0  . Smokeless tobacco: Never Used  . Tobacco comment: Quit in 2001  Substance Use Topics  . Alcohol use: No    Alcohol/week: 0.0 standard drinks  . Drug use: No    ROS   Objective:   Vitals: BP (!) 165/83 (BP Location: Right Arm)   Pulse 71   Temp 98.2 F (36.8 C) (Oral)   Wt 190 lb (86.2 kg)   SpO2 100%   BMI 34.75 kg/m   Physical Exam Constitutional:      General: She is not in acute distress.    Appearance: Normal appearance. She is well-developed. She is not ill-appearing, toxic-appearing or diaphoretic.  HENT:     Head: Normocephalic and atraumatic.     Nose: Nose normal.     Mouth/Throat:     Mouth: Mucous membranes are moist.  Eyes:     Extraocular Movements: Extraocular movements intact.     Pupils: Pupils are equal, round, and reactive to light.  Cardiovascular:     Rate and Rhythm: Normal rate and regular rhythm.     Pulses: Normal pulses.     Heart sounds: Normal heart sounds. No murmur. No friction rub. No gallop.   Pulmonary:     Effort: Pulmonary  effort is normal. No respiratory distress.     Breath sounds: Normal breath sounds. No stridor. No wheezing, rhonchi or rales.  Musculoskeletal:       Arms:  Skin:    General: Skin is warm and dry.     Findings: No rash.  Neurological:     Mental Status: She is alert and oriented to person, place, and time.  Psychiatric:        Mood  and Affect: Mood normal.        Behavior: Behavior normal.        Thought Content: Thought content normal.        Judgment: Judgment normal.     Recent Results (from the past 2160 hour(s))  Novel Coronavirus, NAA ([facility])     Status: None   Collection Time: 01/27/19  8:46 AM   Specimen: Nasopharyngeal(NP) swabs in vial transport medium   NASOPHARYNGE  TESTING  Result Value Ref Range   SARS-CoV-2, NAA Not Detected Not Detected    Comment: This nucleic acid amplification test was developed and its performance characteristics determined by [employer]. Nucleic acid amplification tests include PCR and TMA. This test has not been FDA cleared or approved. This test has been authorized by FDA under an Emergency Use Authorization (EUA). This test is only authorized for the duration of time the declaration that circumstances exist justifying the authorization of the emergency use of in vitro diagnostic tests for detection of SARS-CoV-2 virus and/or diagnosis of COVID-19 infection under section 564(b)(1) of the Act, 21 U.S.C. 956LOV-5(I) (1), unless the authorization is terminated or revoked sooner. When diagnostic testing is negative, the possibility of a false negative result should be considered in the context of a patient's recent exposures and the presence of clinical signs and symptoms consistent with COVID-19. An individual without symptoms of COVID-19 and who is not shedding SARS-CoV-2 virus would  expect to have a negative (not detected) result in this assay.   Urine culture     Status: Abnormal   Collection Time: 02/12/19  7:40 AM   Specimen: Urine, Clean Catch  Result Value Ref Range   Specimen Description URINE, CLEAN CATCH    Special Requests      NONE Performed at [facility], [address], [place], [place] [address]    Culture 80,000 COLONIES/mL ESCHERICHIA COLI (A)    Report Status 02/15/2019 FINAL    Organism ID, Bacteria ESCHERICHIA COLI (A)        Susceptibility   Escherichia coli - MIC*    AMPICILLIN 4 SENSITIVE Sensitive     CEFAZOLIN <=4 SENSITIVE Sensitive     CEFTRIAXONE <=0.25 SENSITIVE Sensitive     CIPROFLOXACIN <=0.25 SENSITIVE Sensitive     GENTAMICIN <=1 SENSITIVE Sensitive     IMIPENEM <=0.25 SENSITIVE Sensitive     NITROFURANTOIN <=16 SENSITIVE Sensitive     TRIMETH/SULFA <=20 SENSITIVE Sensitive     AMPICILLIN/SULBACTAM <=2 SENSITIVE Sensitive     PIP/TAZO <=4 SENSITIVE Sensitive     * 80,000 COLONIES/mL ESCHERICHIA COLI  Urinalysis, Routine w reflex microscopic     Status: Abnormal   Collection Time: 02/12/19  5:06 PM  Result Value Ref Range   Color, Urine STRAW (A) YELLOW   APPearance HAZY (A) CLEAR   Specific Gravity, Urine 1.011 1.005 - 1.030   pH 5.0 5.0 - 8.0   Glucose, UA NEGATIVE NEGATIVE mg/dL   Hgb urine dipstick NEGATIVE NEGATIVE   Bilirubin Urine NEGATIVE NEGATIVE   Ketones, ur  NEGATIVE NEGATIVE mg/dL   Protein, ur NEGATIVE NEGATIVE mg/dL   Nitrite NEGATIVE NEGATIVE   Leukocytes,Ua SMALL (A) NEGATIVE   RBC / HPF 0-5 0 - 5 RBC/hpf   WBC, UA 6-10 0 - 5 WBC/hpf   Bacteria, UA RARE (A) NONE SEEN   Squamous Epithelial / LPF 0-5 0 - 5   Mucus PRESENT     Comment: Performed at [facility], [place] [address], [place], [place] [address]  Basic metabolic panel     Status: None   Collection Time: 02/12/19  5:17 PM  Result Value Ref Range   Sodium 138 135 - 145 mmol/L   Potassium 4.1 3.5 - 5.1 mmol/L   Chloride 103 98 - 111 mmol/L   CO2 26 22 - 32 mmol/L   Glucose, Bld 81 70 - 99 mg/dL   BUN 20 8 - 23 mg/dL   Creatinine, Ser 0.90 0.44 - 1.00 mg/dL   Calcium 9.4 8.9 - 10.3 mg/dL   GFR calc non Af Amer >60 >60 mL/min   GFR calc Af Amer >60 >60 mL/min   Anion gap 9 5 - 15    Comment: Performed at [facility] [address] [place] [address]  CBC     Status: None   Collection Time: 02/12/19  5:17 PM  Result Value Ref Range   WBC 4.3 4.0 - 10.5 K/uL   RBC 4.59 3.87 - 5.11  MIL/uL   Hemoglobin 12.7 12.0 - 15.0 g/dL   HCT 39.9 36.0 - 46.0 %   MCV 86.9 80.0 - 100.0 fL   MCH 27.7 26.0 - 34.0 pg   MCHC 31.8 30.0 - 36.0 g/dL   RDW 14.1 11.5 - 15.5 %   Platelets 170 150 - 400 K/uL   nRBC 0.0 0.0 - 0.2 %    Comment: Performed at [facility], [place] [address], [place], [place] [address]     Assessment and Plan :   1. Rheumatoid arthritis flare (HCC)   2. Urinary frequency   3. Well controlled diabetes mellitus ([place])   4. Bilateral hand pain   5. Cystitis     Patient has slight pale discoloration of her hand bilaterally. Will use prednisone course for her. Recommended maintaining her hands warm. F/u with rheumatologist, ortho. Counseled on trigger finger and need to have injection or procedural intervention with ortho. She is to start cipro for her UTI. She was unable to provide urine sample in clinic. Cipro will be used given extensive allergy list. Counseled patient on potential for adverse effects with medications prescribed/recommended today, ER and return-to-clinic precautions discussed, patient verbalized understanding.    Jaynee Eagles, [place] 02/28/19 1625

## 2019-02-28 NOTE — Discharge Instructions (Addendum)
You may take 500mg -650mg  Tylenol every 6 hours for pain, aches, joint pains.

## 2019-02-28 NOTE — ED Triage Notes (Addendum)
Pt states she has hand pain. Pt was told by her PCP to come to the office today. Pt states she left hand fingers are locking  And she had thumb pain. Pt states her hands are cold all of the time. Pt states she has been voiding a lot as well.

## 2019-03-02 ENCOUNTER — Encounter: Payer: Medicare Other | Admitting: Gastroenterology

## 2019-03-02 ENCOUNTER — Telehealth: Payer: Self-pay | Admitting: Internal Medicine

## 2019-03-02 NOTE — Telephone Encounter (Signed)
Patient called requesting Dr. Quay Burow to review urgent care notes for 1/31.

## 2019-03-03 NOTE — Telephone Encounter (Signed)
I did review those notes.

## 2019-03-18 ENCOUNTER — Encounter: Payer: Self-pay | Admitting: Orthopaedic Surgery

## 2019-03-18 ENCOUNTER — Ambulatory Visit (INDEPENDENT_AMBULATORY_CARE_PROVIDER_SITE_OTHER): Payer: Medicare Other | Admitting: Orthopaedic Surgery

## 2019-03-18 ENCOUNTER — Other Ambulatory Visit: Payer: Self-pay

## 2019-03-18 DIAGNOSIS — M65342 Trigger finger, left ring finger: Secondary | ICD-10-CM | POA: Diagnosis not present

## 2019-03-18 NOTE — Progress Notes (Signed)
Office Visit Note   Patient: Samantha [name]           Date of Birth: 16-Feb-1940           MRN: [id number] Visit Date: 03/18/2019              Requested by: [name], MD [place],  [place] [address] PCP: [name], MD   Assessment & Plan: Visit Diagnoses:  1. Trigger finger, left ring finger     Plan: Injection performed which she tolerated well.  Pathophysiology discussed return if she has persistent problems.  Follow-Up Instructions: Return in about 3 months (around 06/15/2019), or if symptoms worsen or fail to improve.   Orders:  Orders Placed This Encounter  Procedures  . Hand/UE Inj: L ring A1   No orders of the defined types were placed in this encounter.     Procedures: Hand/UE Inj: L ring A1 for trigger finger on 03/18/2019 11:56 AM Medications: 0.3 mL lidocaine 1 %; 0.33 mL bupivacaine 0.25 %; 13.33 mg methylPREDNISolone acetate 40 MG/ML      Clinical Data: No additional findings.   Subjective: Chief Complaint  Patient presents with  . Right Hand - Pain  . Left Hand - Pain    HPI 79 year old female seen with left ring triggering.  She has to manually unlock the finger to been painful.  She is also noticed that in the cold she has had problems with her fingers has an appointment with a rheumatologist coming up and was diagnosed with Raynaud's by her PCP.  She denies associated neck pain currently.  Review of Systems 14 point update unchanged from 01/13/2019 other than as mentioned above.   Objective: Vital Signs: Ht 5' 2.5" (1.588 m)   Wt 192 lb (87.1 kg)   BMI 34.56 kg/m   Physical Exam Constitutional:      Appearance: She is well-developed.  HENT:     Head: Normocephalic.     Right Ear: External ear normal.     Left Ear: External ear normal.  Eyes:     Pupils: Pupils are equal, round, and reactive to light.  Neck:     Thyroid: No thyromegaly.     Trachea: No tracheal deviation.  Cardiovascular:     Rate and  Rhythm: Normal rate.  Pulmonary:     Effort: Pulmonary effort is normal.  Abdominal:     Palpations: Abdomen is soft.  Skin:    General: Skin is warm and dry.  Neurological:     Mental Status: She is alert and oriented to person, place, and time.  Psychiatric:        Behavior: Behavior normal.     Ortho Exam patient had left ring triggering tenderness over the A1 pulley.  She is able to demonstrate active triggering.  Pulses at the wrist are normal good capillary refill.  Specialty Comments:  No specialty comments available.  Imaging: No results found.   PMFS History: Patient Active Problem List   Diagnosis Date Noted  . Trigger finger, left ring finger 03/18/2019  . Unilateral primary osteoarthritis, right knee 01/28/2018  . 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 01/28/2018  . Sacral back pain 10/16/2017  . Chronic left upper quadrant pain 07/23/2017  . Rectal bleeding 07/23/2017  . Poor balance 03/14/2017  . Right shoulder pain 03/14/2017  . Angina pectoris ([place]) 03/22/2016  . Hypertensive heart disease 07/07/2015  . Essential hypertension 07/07/2015  .  Plantar fasciitis, left 03/22/2015  . Coronary artery disease due to lipid rich plaque 01/05/2015  . Chronic diastolic CHF (congestive heart failure), NYHA class 2 ([place]) 01/05/2015  . 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 Migraine ([place]) 01/05/2014  . VBI (vertebrobasilar insufficiency) 08/22/2012  . TIA (transient ischemic attack) 06/27/2012  . DJD (degenerative joint disease) 02/02/2011  . 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 Postherpetic neuralgia ? 01/02/2010  . Palpitations 11/23/2008  . UTI'S, RECURRENT 09/28/2008  . Fibromyalgia 08/15/2007  . 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 Hyperlipidemia 05/21/2006  . Reactive airway disease 01/29/2002   Past Medical History:  Diagnosis  Date  . Abnormal CT of the chest 2008   last CT4-l 2009:  . No f/u suggested   . Allergy   . Asthma   . CAD (coronary artery disease)    a. Coronary CTA 10/16: Coronary Ca score 211, mod non-obstructive CAD with LM mild plaque (25-50%), mid LAD 50-69%. b. Neg nuc 06/2015.  . Cataract    BILATERAL-REMOVED  . Chronic diastolic CHF (congestive heart failure) ([place])   . Collagen vascular disease ([place])    "arterial sclerosis" per pt  . Complication of anesthesia    trouble waking up  . Fibromyalgia   . GERD (gastroesophageal reflux disease)   . H/O hiatal hernia   . Heart murmur   . Hyperlipidemia   . Hypertension   . Malignant neoplasm of ascending colon ([place]) 2016   Minimally invasive right hemicolectomy to be done   . Neuromuscular disorder (HCC)    FIBROMYALGIA  . Ocular migraine   . OSA (obstructive sleep apnea) 09/2007   dx w/ a sleep study, not on  CPAP  . Osteoarthritis   . Osteoporosis   . Pneumonia    "double" in 2004  . PONV (postoperative nausea and vomiting)   . Reactive airway disease 01/29/2002   dx of pseudoasthma / vcd in 2005 and nl sprirometry History of dyspnea, 2011,  improved after several medications were changed around Question of COPD, disproved July 06, 2009 with nl pft's      . Rheumatoid factor positive   . Shingles 11/2009  . Sleep apnea   . Stroke ([place])   . TIA (transient ischemic attack)    x2 - on Plavix for this  . Torn rotator cuff    right worse than left, both are torn  . Tumor, 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  . Type II diabetes mellitus ([place])   . 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 Vaginal dysplasia     Family History  Problem Relation Age of Onset  . Heart disease Father   . Heart disease Mother   . Lung cancer Mother   . Allergies Sister   . Parkinsonism Sister        possible  . Asthma Sister   . Asthma Paternal Grandmother   . Stroke Paternal Grandmother   . Heart disease Other        paternal grandparents, maternal  grandparents,   . Heart disease Brother   . Emphysema Brother   . Aneurysm Brother        x3  . Kidney failure Brother   . Diabetes Brother   . Diabetes Brother   . Stroke Brother   . Stroke Maternal Grandmother   . Breast cancer Neg Hx   . Colon cancer Neg  Hx   . Heart attack Neg Hx     Past Surgical History:  Procedure Laterality Date  . ABDOMINAL HYSTERECTOMY  1980   NO oophorectomy per pt   . ANTERIOR CERVICAL DECOMP/DISCECTOMY FUSION  2001   C 3, C4 and C5 plate and screws  . BREAST BIOPSY Right 1999  . BUNIONECTOMY Left ~ 1977  . CARDIAC CATHETERIZATION     2018 By Dr. Pernell Dupre (done after colon surgery)  . CATARACT EXTRACTION W/ INTRAOCULAR LENS  IMPLANT, BILATERAL  2012  . COLON SURGERY  10/2014  . EYE SURGERY Bilateral    torq lens for cataracts  . LEFT HEART CATH AND CORONARY ANGIOGRAPHY N/A 03/22/2016   Procedure: Left Heart Cath and Coronary Angiography;  Surgeon: Belva Crome, MD;  Location: [facility];  Service: Cardiovascular;  Laterality: N/A;  . LUMBAR LAMINECTOMY/DECOMPRESSION MICRODISCECTOMY N/A 01/20/2018   Procedure: L4-5 decompression;  Surgeon: Marybelle Killings, MD;  Location: [facility];  Service: Orthopedics;  Laterality: N/A;  . THYROIDECTOMY, PARTIAL  1960's  . VAGINAL MASS EXCISION  1994   "Laser surgery for vaginal cancer; followed by chemotherapy" (06/27/2012)   Social History   Occupational History  . Occupation: Retired, disable since 2000    Employer: RETIRED  Tobacco Use  . Smoking status: Former Smoker    Packs/day: 0.25    Years: 5.00    Pack years: 1.25    Types: Cigarettes    Quit date: 01/29/1998    Years since quitting: 21.1  . Smokeless tobacco: Never Used  . Tobacco comment: Quit in 2001  Substance and Sexual Activity  . Alcohol use: No    Alcohol/week: 0.0 standard drinks  . Drug use: No  . Sexual activity: Never

## 2019-03-20 MED ORDER — BUPIVACAINE HCL 0.25 % IJ SOLN
0.3300 mL | INTRAMUSCULAR | Status: AC | PRN
  Administered 2019-03-18: 12:00:00 .33 mL

## 2019-03-20 MED ORDER — METHYLPREDNISOLONE ACETATE 40 MG/ML IJ SUSP
13.3300 mg | INTRAMUSCULAR | Status: AC | PRN
  Administered 2019-03-18: 12:00:00 13.33 mg

## 2019-03-20 MED ORDER — LIDOCAINE HCL 1 % IJ SOLN
0.3000 mL | INTRAMUSCULAR | Status: AC | PRN
  Administered 2019-03-18: 12:00:00 .3 mL

## 2019-03-24 DIAGNOSIS — M069 Rheumatoid arthritis, unspecified: Secondary | ICD-10-CM | POA: Diagnosis not present

## 2019-03-24 DIAGNOSIS — E785 Hyperlipidemia, unspecified: Secondary | ICD-10-CM | POA: Diagnosis not present

## 2019-03-24 DIAGNOSIS — M1990 Unspecified osteoarthritis, unspecified site: Secondary | ICD-10-CM | POA: Diagnosis not present

## 2019-03-24 DIAGNOSIS — M359 Systemic involvement of connective tissue, unspecified: Secondary | ICD-10-CM | POA: Diagnosis not present

## 2019-03-24 DIAGNOSIS — M25569 Pain in unspecified knee: Secondary | ICD-10-CM | POA: Diagnosis not present

## 2019-03-24 DIAGNOSIS — E119 Type 2 diabetes mellitus without complications: Secondary | ICD-10-CM | POA: Diagnosis not present

## 2019-03-24 DIAGNOSIS — I7300 Raynaud's syndrome without gangrene: Secondary | ICD-10-CM | POA: Diagnosis not present

## 2019-03-24 DIAGNOSIS — M797 Fibromyalgia: Secondary | ICD-10-CM | POA: Diagnosis not present

## 2019-03-24 DIAGNOSIS — Z8739 Personal history of other diseases of the musculoskeletal system and connective tissue: Secondary | ICD-10-CM | POA: Diagnosis not present

## 2019-03-24 DIAGNOSIS — I10 Essential (primary) hypertension: Secondary | ICD-10-CM | POA: Diagnosis not present

## 2019-03-25 ENCOUNTER — Other Ambulatory Visit: Payer: Self-pay | Admitting: Cardiology

## 2019-04-09 ENCOUNTER — Other Ambulatory Visit: Payer: Self-pay | Admitting: Cardiology

## 2019-04-15 ENCOUNTER — Ambulatory Visit (INDEPENDENT_AMBULATORY_CARE_PROVIDER_SITE_OTHER): Payer: Medicare Other

## 2019-04-15 ENCOUNTER — Other Ambulatory Visit: Payer: Self-pay | Admitting: Gastroenterology

## 2019-04-15 DIAGNOSIS — Z1159 Encounter for screening for other viral diseases: Secondary | ICD-10-CM

## 2019-04-16 ENCOUNTER — Telehealth: Payer: Self-pay

## 2019-04-16 LAB — SARS CORONAVIRUS 2 (TAT 6-24 HRS): SARS Coronavirus 2: NEGATIVE

## 2019-04-16 NOTE — Telephone Encounter (Signed)
Spoke to patient regarding stopping her Plavix  5 days prior to procedure, as her procedure was rescheduled from 03/02/19.  She stated that she remembered to stop it and took her last dose on 04/15/19.  Also reviewed her prep instructions since the time of her new procedure had changed.  She stated that she had read her instructions and was grateful for the new updated information and understood all.

## 2019-04-20 ENCOUNTER — Ambulatory Visit (AMBULATORY_SURGERY_CENTER): Payer: Medicare Other | Admitting: Gastroenterology

## 2019-04-20 ENCOUNTER — Encounter: Payer: Medicare Other | Admitting: Gastroenterology

## 2019-04-20 ENCOUNTER — Encounter: Payer: Self-pay | Admitting: Gastroenterology

## 2019-04-20 ENCOUNTER — Other Ambulatory Visit: Payer: Self-pay

## 2019-04-20 VITALS — BP 109/46 | HR 51 | Temp 97.1°F | Resp 19 | Ht 62.0 in | Wt 191.0 lb

## 2019-04-20 DIAGNOSIS — R197 Diarrhea, unspecified: Secondary | ICD-10-CM | POA: Diagnosis not present

## 2019-04-20 DIAGNOSIS — Z85038 Personal history of other malignant neoplasm of large intestine: Secondary | ICD-10-CM

## 2019-04-20 MED ORDER — SODIUM CHLORIDE 0.9 % IV SOLN: 500.0000 mL | Freq: Once | INTRAVENOUS | Status: DC

## 2019-04-20 NOTE — Progress Notes (Signed)
pt tolerated well. VSS. awake and to recovery. Report given to RN.  

## 2019-04-20 NOTE — Op Note (Signed)
[facility] Patient Name: Samantha [name] Procedure Date: 04/20/2019 11:06 AM MRN: [id number] Endoscopist: Milus Banister , MD Age: 79 Referring MD:  Date of Birth: 1940-11-15 Gender: Female Account #: [id number] Procedure:                Colonoscopy Indications:              High risk colon cancer surveillance: Personal                            history of colon cancer; Colon cancer;colonoscopy                            [name] 7 2016 done for minor rectal bleeding                            found a small descending colon firm polypoid                            lesion. This was resected with snare cautery and                            confirmed adenocarcinoma. She underwent                            robot-assisted right colectomy with [name]                            October 2016 and the pathologic specimen prove that                            there was no residual adenocarcinoma. This was a                            T1, N0 M0 right-sided colon cancer. Colonoscopy                            January 2018 normal right sided anastomosis, single                            3 mm polyp was found and removed. It was                            adenomatous on biopsy. Medicines:                Monitored Anesthesia Care Procedure:                Pre-Anesthesia Assessment:                           - Prior to the procedure, a History and Physical                            was performed, and patient medications and  allergies were reviewed. The patient's tolerance of                            previous anesthesia was also reviewed. The risks                            and benefits of the procedure and the sedation                            options and risks were discussed with the patient.                            All questions were answered, and informed consent                            was obtained. Prior Anticoagulants: The patient  has                            taken Plavix (clopidogrel), last dose was 5 days                            prior to procedure. ASA Grade Assessment: II - A                            patient with mild systemic disease. After reviewing                            the risks and benefits, the patient was deemed in                            satisfactory condition to undergo the procedure.                           After obtaining informed consent, the colonoscope                            was passed under direct vision. Throughout the                            procedure, the patient's blood pressure, pulse, and                            oxygen saturations were monitored continuously. The                            Colonoscope was introduced through the anus and                            advanced to the the ileocolonic anastomosis. The                            colonoscopy was performed without difficulty. The  patient tolerated the procedure well. The quality                            of the bowel preparation was good. The rectum was                            photographed. Scope In: 11:10:13 AM Scope Out: 11:16:50 AM Scope Withdrawal Time: 0 hours 4 minutes 37 seconds  Total Procedure Duration: 0 hours 6 minutes 37 seconds  Findings:                 Normal appearing ileocolonic anastomosis from 2016                            right hemicolectomy.                           Multiple small and large-mouthed diverticula were                            found in the left colon.                           The exam was otherwise without abnormality on                            direct and retroflexion views. Complications:            No immediate complications. Estimated blood loss:                            None. Estimated Blood Loss:     Estimated blood loss: none. Impression:               - Normal ileocolonic anastomosis.                           -  Diverticulosis in the left colon.                           - The examination was otherwise normal on direct                            and retroflexion views.                           - No polyps or cancers. Recommendation:           - Patient has a contact number available for                            emergencies. The signs and symptoms of potential                            delayed complications were discussed with the                            patient. Return to normal  activities tomorrow.                            Written discharge instructions were provided to the                            patient.                           - Resume previous diet.                           - Continue present medications. It is safe to                            resume your blood thinner today.                           - Repeat colonoscopy in 5 years for surveillance. Milus Banister, MD 04/20/2019 11:20:57 AM This report has been signed electronically.

## 2019-04-20 NOTE — Progress Notes (Signed)
Temp by JB, vitals by KA

## 2019-04-20 NOTE — Patient Instructions (Signed)
Handout on diverticulosis. Resume Plavix today    YOU HAD AN ENDOSCOPIC PROCEDURE TODAY AT [facility]:   Refer to the procedure report that was given to you for any specific questions about what was found during the examination.  If the procedure report does not answer your questions, please call your gastroenterologist to clarify.  If you requested that your care partner not be given the details of your procedure findings, then the procedure report has been included in a sealed envelope for you to review at your convenience later.  YOU SHOULD EXPECT: Some feelings of bloating in the abdomen. Passage of more gas than usual.  Walking can help get rid of the air that was put into your GI tract during the procedure and reduce the bloating. If you had a lower endoscopy (such as a colonoscopy or flexible sigmoidoscopy) you may notice spotting of blood in your stool or on the toilet paper. If you underwent a bowel prep for your procedure, you may not have a normal bowel movement for a few days.  Please Note:  You might notice some irritation and congestion in your nose or some drainage.  This is from the oxygen used during your procedure.  There is no need for concern and it should clear up in a day or so.  SYMPTOMS TO REPORT IMMEDIATELY:   Following lower endoscopy (colonoscopy or flexible sigmoidoscopy):  Excessive amounts of blood in the stool  Significant tenderness or worsening of abdominal pains  Swelling of the abdomen that is new, acute  Fever of 100F or higher   For urgent or emergent issues, a gastroenterologist can be reached at any hour by calling [phone]. Do not use MyChart messaging for urgent concerns.    DIET:  We do recommend a small meal at first, but then you may proceed to your regular diet.  Drink plenty of fluids but you should avoid alcoholic beverages for 24 hours.  ACTIVITY:  You should plan to take it easy for the rest of today and you should  NOT DRIVE or use heavy machinery until tomorrow (because of the sedation medicines used during the test).    FOLLOW UP: Our staff will call the number listed on your records 48-72 hours following your procedure to check on you and address any questions or concerns that you may have regarding the information given to you following your procedure. If we do not reach you, we will leave a message.  We will attempt to reach you two times.  During this call, we will ask if you have developed any symptoms of COVID 19. If you develop any symptoms (ie: fever, flu-like symptoms, shortness of breath, cough etc.) before then, please call [phone].  If you test positive for Covid 19 in the 2 weeks post procedure, please call and report this information to [place].    If any biopsies were taken you will be contacted by phone or by letter within the next 1-3 weeks.  Please call us at [phone] if you have not heard about the biopsies in 3 weeks.    SIGNATURES/CONFIDENTIALITY: You and/or your care partner have signed paperwork which will be entered into your electronic medical record.  These signatures attest to the fact that that the information above on your After Visit Summary has been reviewed and is understood.  Full responsibility of the confidentiality of this discharge information lies with you and/or your care-partner.

## 2019-04-22 ENCOUNTER — Telehealth: Payer: Self-pay

## 2019-04-22 NOTE — Telephone Encounter (Signed)
  Follow up Call-  Call back number 04/20/2019  Post procedure Call Back phone  # [phone]  Permission to leave phone message Yes  Some recent data might be hidden     Patient questions:  Do you have a fever, pain , or abdominal swelling? No. Pain Score  0 *  Have you tolerated food without any problems? Yes.    Have you been able to return to your normal activities? Yes.    Do you have any questions about your discharge instructions: Diet   No. Medications  No. Follow up visit  No.  Do you have questions or concerns about your Care? No.  Actions: * If pain score is 4 or above: No action needed, pain <4.  1. Have you developed a fever since your procedure? no  2.   Have you had an respiratory symptoms (SOB or cough) since your procedure? no  3.   Have you tested positive for COVID 19 since your procedure no  4.   Have you had any family members/close contacts diagnosed with the COVID 19 since your procedure?  no   If yes to any of these questions please route to Joylene John, RN and Erenest Rasher, RN

## 2019-05-05 NOTE — Progress Notes (Signed)
Subjective:    Patient ID: Samantha [name], female    DOB: February 26, 1940, 79 y.o.   MRN: [id number]  HPI The patient is here for follow up of their chronic medical problems, including diabetes with neuropathy, hypertension, hyperlipidemia, fibromyalgia  She is taking all of her medications as prescribed.      The past few days has been SOB, not today.  She denied any increased leg swelling, but thought it could be related to increased fluid.  She has been taking her hctz but it was not helping.  She has been compliant with a  Low sodium diet.  She denies SOB today.  She has had some cough, wheeze from allergies.   She has been having severe pain from her right neck to the right side of her head.  She is having neck pain.  If she is having neck pain.  If she looks up she will have neck pain.      Medications and allergies reviewed with patient and updated if appropriate.  Patient Active Problem List   Diagnosis Date Noted  . Raynaud phenomenon 05/06/2019  . Trigger finger, left ring finger 03/18/2019  . Unilateral primary osteoarthritis, right knee 01/28/2018  . 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 01/28/2018  . Sacral back pain 10/16/2017  . Chronic left upper quadrant pain 07/23/2017  . Rectal bleeding 07/23/2017  . Poor balance 03/14/2017  . Right shoulder pain 03/14/2017  . Angina pectoris ([place]) 03/22/2016  . Hypertensive heart disease 07/07/2015  . Essential hypertension 07/07/2015  . Plantar fasciitis, left 03/22/2015  . Coronary artery disease due to lipid rich plaque 01/05/2015  . Chronic diastolic CHF (congestive heart failure), NYHA class 2 ([place]) 01/05/2015  . 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 Migraine (O[facility]) 01/05/2014  . VBI (vertebrobasilar insufficiency) 08/22/2012  . TIA (transient ischemic attack) 06/27/2012  . DJD (degenerative joint disease)  02/02/2011  . 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 Postherpetic neuralgia ? 01/02/2010  . Palpitations 11/23/2008  . UTI'S, RECURRENT 09/28/2008  . Fibromyalgia 08/15/2007  . 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 Hyperlipidemia 05/21/2006  . Reactive airway disease 01/29/2002    Current Outpatient Medications on File Prior to Visit  Medication Sig Dispense Refill  . acetaminophen (TYLENOL) 500 MG tablet Take 500 mg by mouth every 6 (six) hours as needed.    [name] albuterol (PROVENTIL HFA;VENTOLIN HFA) 108 (90 Base) MCG/ACT inhaler Inhale 2 puffs into the lungs every 6 (six) hours as needed for wheezing or shortness of breath. 18 g 3  . amLODipine (NORVASC) 5 MG tablet     . budesonide-formoterol (SYMBICORT) 80-4.5 MCG/ACT inhaler Inhale 2 puffs into the lungs 2 (two) times daily. (Patient taking differently: Inhale 2 puffs into the lungs 2 (two) times daily as needed (for shortness of breath or wheezing). ) 1 Inhaler 3  . butalbital-acetaminophen-caffeine (FIORICET) 50-325-40 MG tablet Take 1 tablet by mouth every 6 (six) hours as needed for headache or migraine. 30 tablet 5  . BYSTOLIC 10 MG tablet TAKE 1 TABLET(10 MG) BY MOUTH DAILY 90 tablet 1  . CALCIUM PO Take 1 tablet by mouth every morning.     . cetirizine (ZYRTEC) 10 MG tablet TAKE 1 TABLET(10 MG) BY MOUTH DAILY 90 tablet 2  . cholecalciferol (VITAMIN D) 1000 UNITS tablet Take 1,000 Units by mouth every morning.     [name]  cholestyramine (QUESTRAN) 4 g packet Take 1 packet (4 g total) by mouth daily. 30 packet 11  . clopidogrel (PLAVIX) 75 MG tablet TAKE 1 TABLET(75 MG) BY MOUTH DAILY 90 tablet 1  . Evolocumab (REPATHA SURECLICK) [id number] MG/ML SOAJ Inject 1 pen into the skin every 14 (fourteen) days. 2 pen 11  . hydrochlorothiazide (HYDRODIURIL) 25 MG tablet TAKE 1/2 TABLET(12.5 MG) BY MOUTH DAILY 45 tablet 1  . hydrocortisone 2.5 % cream Apply topically 2 (two) times daily. (Patient taking differently: Apply 1  application topically 2 (two) times daily as needed (for rash). ) 30 g 0  . hydroxychloroquine (PLAQUENIL) 200 MG tablet hydroxychloroquine 200 mg tablet    . isosorbide mononitrate (IMDUR) 60 MG 24 hr tablet TAKE 1 TABLET(60 MG) BY MOUTH DAILY 90 tablet 3  . lisinopril (PRINIVIL,ZESTRIL) 40 MG tablet Take 1 tablet (40 mg total) by mouth daily. (Patient taking differently: Take 20 mg by mouth daily. ) 90 tablet 1  . Magnesium 100 MG CAPS Take by mouth.    . metFORMIN (GLUCOPHAGE-XR) 500 MG 24 hr tablet TAKE 1 TABLET(500 MG) BY MOUTH DAILY WITH BREAKFAST 90 tablet 1  . Polyethyl Glycol-Propyl Glycol (SYSTANE) 0.4-0.3 % GEL Place 1 application into both eyes daily as needed (for dry eyes).     . Potassium 99 MG TABS Take 99 mg by mouth daily.      No current facility-administered medications on file prior to visit.    Past Medical History:  Diagnosis Date  . A-fib ([place])   . Abnormal CT of the chest 2008   last CT4-l 2009:  . No f/u suggested   . Allergy   . Asthma   . CAD (coronary artery disease)    a. Coronary CTA 10/16: Coronary Ca score 211, mod non-obstructive CAD with LM mild plaque (25-50%), mid LAD 50-69%. b. Neg nuc 06/2015.  . Cataract    BILATERAL-REMOVED  . Chronic diastolic CHF (congestive heart failure) ([place])   . Collagen vascular disease ([place])    "arterial sclerosis" per pt  . Complication of anesthesia    trouble waking up  . Fibromyalgia   . GERD (gastroesophageal reflux disease)   . H/O hiatal hernia   . Heart murmur   . Hyperlipidemia   . Hypertension   . Malignant neoplasm of ascending colon ([place]) 2016   Minimally invasive right hemicolectomy to be done   . Neuromuscular disorder ([facility])    FIBROMYALGIA  . Ocular migraine   . OSA (obstructive sleep apnea) 09/2007   dx w/ a sleep study, not on  CPAP  . Osteoarthritis   . Osteoporosis   . Pneumonia    "double" in 2004  . PONV (postoperative nausea and vomiting)   . Reactive airway disease 01/29/2002   dx of  pseudoasthma / vcd in 2005 and nl sprirometry History of dyspnea, 2011,  improved after several medications were changed around Question of COPD, disproved July 06, 2009 with nl pft's      . Rheumatoid factor positive   . Shingles 11/2009  . Sleep apnea   . Stroke ([place])   . TIA (transient ischemic attack)    x2 - on Plavix for this  . Torn rotator cuff    right worse than left, both are torn  . Tumor, 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  . Type II diabetes mellitus ([place])   . 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 Vaginal dysplasia     Past Surgical  History:  Procedure Laterality Date  . ABDOMINAL HYSTERECTOMY  1980   NO oophorectomy per pt   . ANTERIOR CERVICAL DECOMP/DISCECTOMY FUSION  2001   C 3, C4 and C5 plate and screws  . BREAST BIOPSY Right 1999  . BUNIONECTOMY Left ~ 1977  . CARDIAC CATHETERIZATION     2018 By Dr. Pernell Dupre (done after colon surgery)  . CATARACT EXTRACTION W/ INTRAOCULAR LENS  IMPLANT, BILATERAL  2012  . COLON SURGERY  10/2014  . EYE SURGERY Bilateral    torq lens for cataracts  . LEFT HEART CATH AND CORONARY ANGIOGRAPHY N/A 03/22/2016   Procedure: Left Heart Cath and Coronary Angiography;  Surgeon: Belva Crome, MD;  Location: [facility];  Service: Cardiovascular;  Laterality: N/A;  . LUMBAR LAMINECTOMY/DECOMPRESSION MICRODISCECTOMY N/A 01/20/2018   Procedure: L4-5 decompression;  Surgeon: Marybelle Killings, MD;  Location: [facility];  Service: Orthopedics;  Laterality: N/A;  . THYROIDECTOMY, PARTIAL  1960's  . VAGINAL MASS EXCISION  1994   "Laser surgery for vaginal cancer; followed by chemotherapy" (06/27/2012)    Social History   Socioeconomic History  . Marital status: Married    Spouse name: Ilona Sorrel  . Number of children: 2  . Years of education: masters  . Highest education level: Not on file  Occupational History  . Occupation: Retired, disable since 2000    Employer: RETIRED  Tobacco Use  . Smoking status: Former Smoker    Packs/day:  0.25    Years: 5.00    Pack years: 1.25    Types: Cigarettes    Quit date: 01/29/1998    Years since quitting: 21.2  . Smokeless tobacco: Never Used  . Tobacco comment: Quit in 2001  Substance and Sexual Activity  . Alcohol use: No    Alcohol/week: 0.0 standard drinks  . Drug use: No  . Sexual activity: Never  Other Topics Concern  . Not on file  Social History Narrative   On disability since 2000--- also husband has MS   Education. College.   Right handed.   Social Determinants of Health   Financial Resource Strain:   . Difficulty of Paying Living Expenses:   Food Insecurity:   . Worried About Charity fundraiser in the Last Year:   . Arboriculturist in the Last Year:   Transportation Needs:   . Film/video editor (Medical):   [name] Lack of Transportation (Non-Medical):   Physical Activity: Sufficiently Active  . Days of Exercise per Week: 5 days  . Minutes of Exercise per Session: 30 min  Stress: Stress Concern Present  . Feeling of Stress : To some extent  Social Connections:   . Frequency of Communication with Friends and Family:   . Frequency of Social Gatherings with Friends and Family:   . Attends Religious Services:   . Active Member of Clubs or Organizations:   . Attends Archivist Meetings:   [name] Marital Status:     Family History  Problem Relation Age of Onset  . Heart disease Father   . Heart disease Mother   . Lung cancer Mother   . Allergies Sister   . Parkinsonism Sister        possible  . Asthma Sister   . Asthma Paternal Grandmother   . Stroke Paternal Grandmother   . Heart disease Other        paternal grandparents, maternal grandparents,   . Heart disease Brother   . Emphysema Brother   .  Aneurysm Brother        x3  . Kidney failure Brother   . Diabetes Brother   . Diabetes Brother   . Stroke Brother   . Stroke Maternal Grandmother   . Breast cancer Neg Hx   . Colon cancer Neg Hx   . Heart attack Neg Hx   . Esophageal  cancer Neg Hx   . Rectal cancer Neg Hx   . Stomach cancer Neg Hx     Review of Systems  Constitutional: Negative for chills and fever.  Respiratory: Positive for cough and wheezing. Negative for shortness of breath.   Cardiovascular: Negative for chest pain, palpitations and leg swelling.  Neurological: Positive for headaches. Negative for light-headedness.       Objective:   Vitals:   05/06/19 1112  BP: (!) 142/60  Pulse: 67  Resp: 16  Temp: 98.2 F (36.8 C)  SpO2: 99%   BP Readings from Last 3 Encounters:  05/06/19 (!) 142/60  04/20/19 (!) 109/46  02/28/19 (!) 165/83   Wt Readings from Last 3 Encounters:  05/06/19 193 lb (87.5 kg)  04/20/19 191 lb (86.6 kg)  03/18/19 192 lb (87.1 kg)   Body mass index is 35.3 kg/m.   Physical Exam    Constitutional: Appears well-developed and well-nourished. No distress.  HENT:  Head: Normocephalic and atraumatic.  Neck: Neck supple. No tracheal deviation present. No thyromegaly present.  No cervical lymphadenopathy Cardiovascular: Normal rate, regular rhythm and normal heart sounds.   No murmur heard. No carotid bruit .  No edema Pulmonary/Chest: Effort normal and breath sounds normal. No respiratory distress. No has no wheezes. No rales.  Skin: Skin is warm and dry. Not diaphoretic.  Psychiatric: Normal mood and affect. Behavior is normal.      Assessment & Plan:    See Problem List for Assessment and Plan of chronic medical problems.    This visit occurred during the SARS-CoV-2 public health emergency.  Safety protocols were in place, including screening questions prior to the visit, additional usage of staff PPE, and extensive cleaning of exam room while observing appropriate contact time as indicated for disinfecting solutions.

## 2019-05-05 NOTE — Patient Instructions (Addendum)
  Your a1c was checked today.     Medications reviewed and updated.  Changes include :   lyrica changed to 75 mg nightly  Your prescription(s) have been submitted to your pharmacy. Please take as directed and contact our office if you believe you are having problem(s) with the medication(s).     Please followup in 6 months

## 2019-05-06 ENCOUNTER — Other Ambulatory Visit: Payer: Self-pay

## 2019-05-06 ENCOUNTER — Ambulatory Visit (INDEPENDENT_AMBULATORY_CARE_PROVIDER_SITE_OTHER): Payer: Medicare Other | Admitting: Internal Medicine

## 2019-05-06 ENCOUNTER — Encounter: Payer: Self-pay | Admitting: Internal Medicine

## 2019-05-06 VITALS — BP 142/60 | HR 67 | Temp 98.2°F | Resp 16 | Ht 62.0 in | Wt 193.0 lb

## 2019-05-06 DIAGNOSIS — M797 Fibromyalgia: Secondary | ICD-10-CM | POA: Diagnosis not present

## 2019-05-06 DIAGNOSIS — E11311 Type 2 diabetes mellitus with unspecified diabetic retinopathy with macular edema: Secondary | ICD-10-CM

## 2019-05-06 DIAGNOSIS — Z794 Long term (current) use of insulin: Secondary | ICD-10-CM

## 2019-05-06 DIAGNOSIS — I7300 Raynaud's syndrome without gangrene: Secondary | ICD-10-CM | POA: Insufficient documentation

## 2019-05-06 DIAGNOSIS — I10 Essential (primary) hypertension: Secondary | ICD-10-CM

## 2019-05-06 DIAGNOSIS — E782 Mixed hyperlipidemia: Secondary | ICD-10-CM

## 2019-05-06 LAB — POCT GLYCOSYLATED HEMOGLOBIN (HGB A1C): Hemoglobin A1C: 5.9 % — AB (ref 4.0–5.6)

## 2019-05-06 MED ORDER — PREGABALIN 75 MG PO CAPS: 75.0000 mg | ORAL_CAPSULE | Freq: Every day | ORAL | 0 refills | Status: DC

## 2019-05-06 NOTE — Assessment & Plan Note (Addendum)
Chronic Check a1c Low sugar / carb diet encouraged regular exercise Continue metformin lyrica dose changed to daily 75 mg - hopefully with help with neuropathy

## 2019-05-06 NOTE — Assessment & Plan Note (Signed)
Chronic Managed by cardiology On repatha  healthy diet encouraged

## 2019-05-06 NOTE — Assessment & Plan Note (Signed)
Chronic Takes lyrica as needed at night only - taking 150 mg prn, but it make her too drowsy in the morning Try changing to 75 mg and try taking nightly

## 2019-05-06 NOTE — Assessment & Plan Note (Addendum)
BP Readings from Last 3 Encounters:  05/06/19 (!) 142/60  04/20/19 (!) 109/46  02/28/19 (!) 165/83   Chronic BP well controlled Current regimen effective and well tolerated Continue current medications at current doses

## 2019-05-11 ENCOUNTER — Other Ambulatory Visit: Payer: Self-pay | Admitting: Cardiology

## 2019-05-12 MED ORDER — LISINOPRIL 40 MG PO TABS: 40.0000 mg | ORAL_TABLET | Freq: Every day | ORAL | 1 refills | Status: DC

## 2019-05-19 ENCOUNTER — Encounter: Payer: Self-pay | Admitting: Internal Medicine

## 2019-05-27 IMAGING — CT CT HEAD W/O CM
4 series · 16 of 47 positions shown, 18 images · non-contrast
Comparison: 12/03/2013 head CT

CLINICAL DATA: Left-sided headache.  Epistaxis.

EXAM:
CT HEAD WITHOUT CONTRAST
TECHNIQUE: Contiguous axial images were obtained from the base of the skull
through the vertex without intravenous contrast.

[Series 3: head without · axial · non-contrast · 0.42mm/px · z∈[-110,+15]mm · 7 of 35 slices shown, 9 images]
[im 5/35  brain]
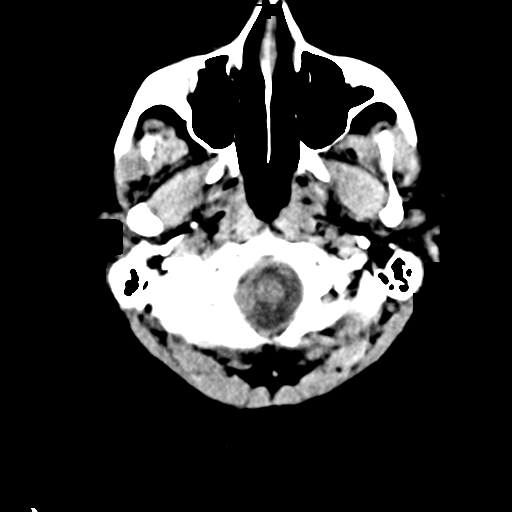
[im 5/35  bone]
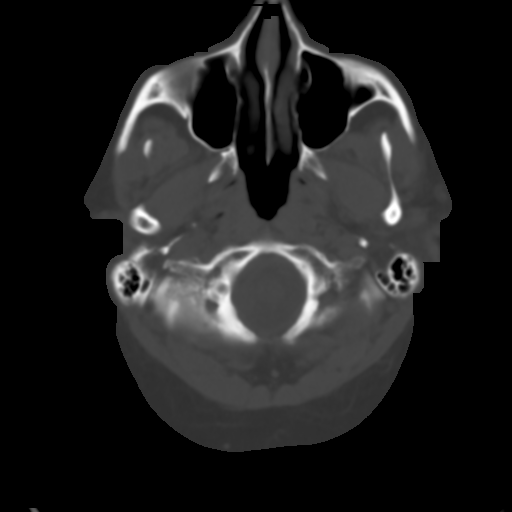
[im 9/35  brain]
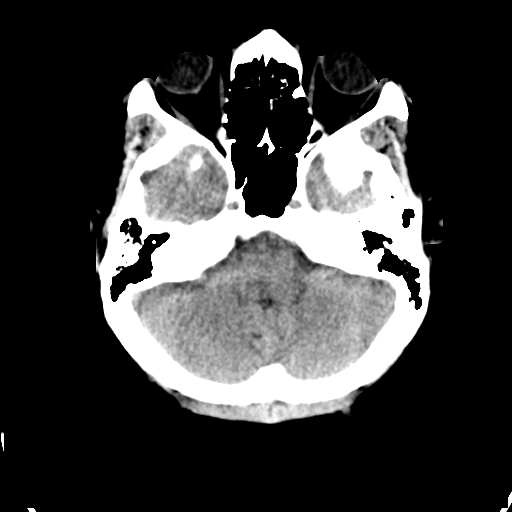
[im 13/35  brain]
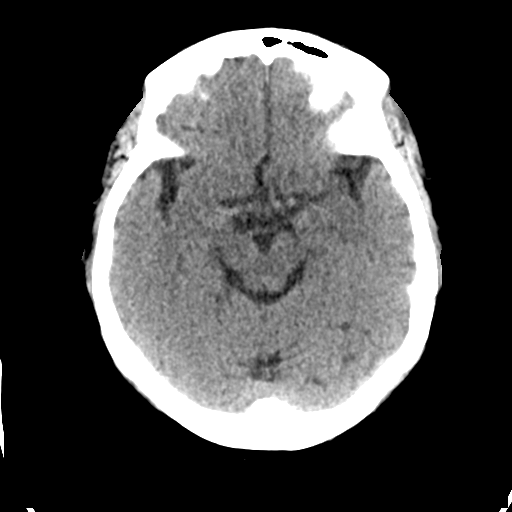
[im 18/35  brain]
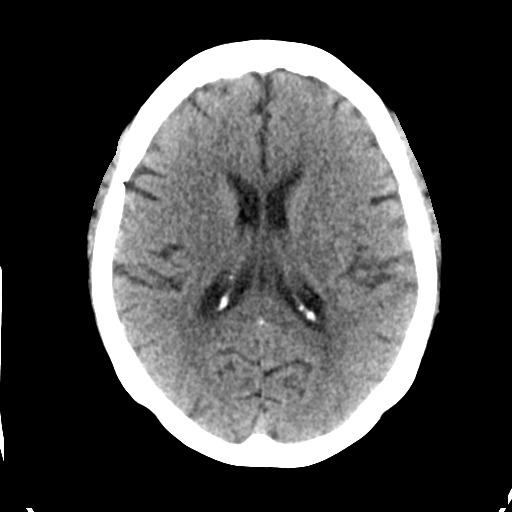
[im 22/35  brain]
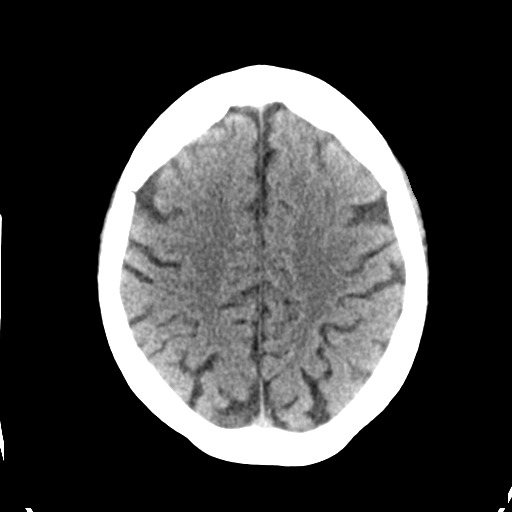
[im 22/35  bone]
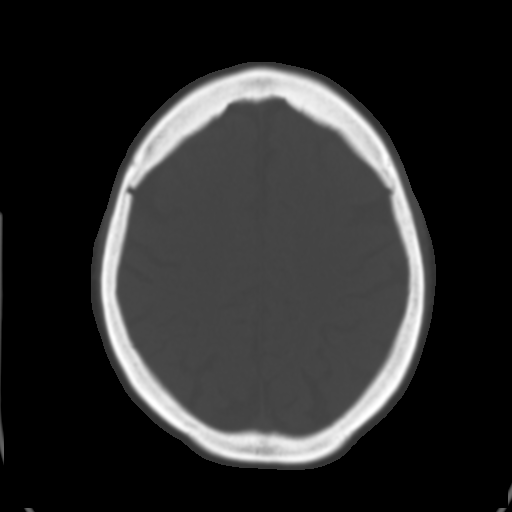
[im 26/35  brain]
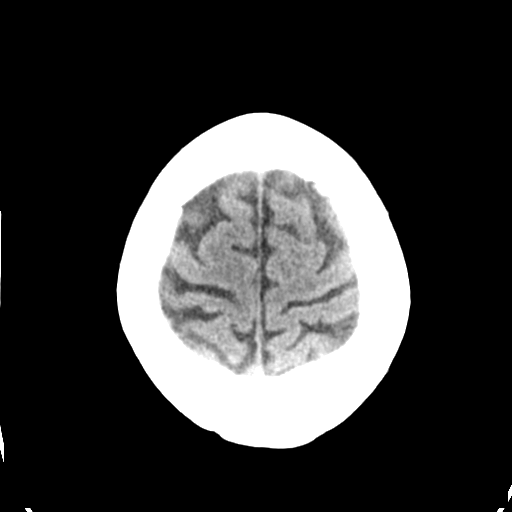
[im 30/35  brain]
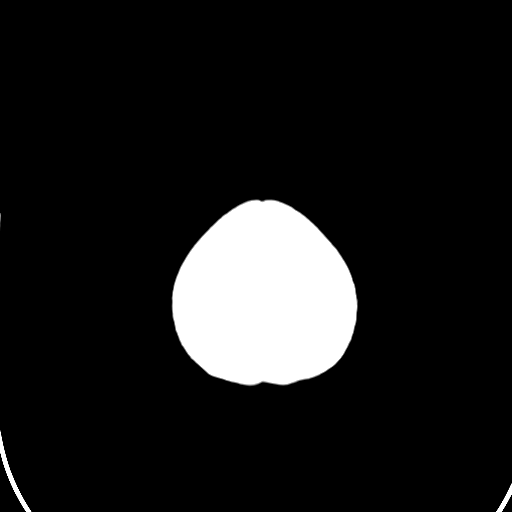

[Series 4: head bone · axial · 0.42mm/px · z∈[-114,-80]mm · 3 of 87 slices shown]
[im 9/87  bone]
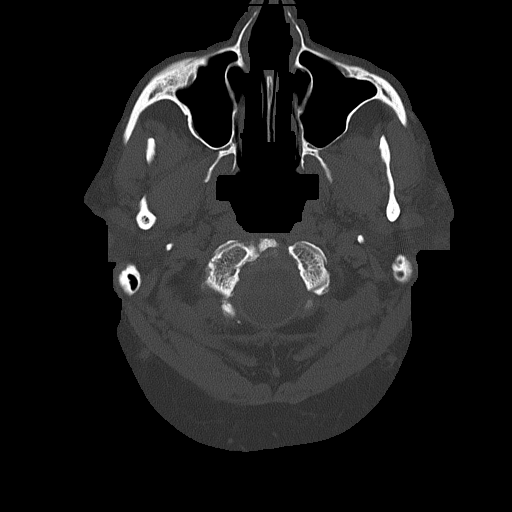
[im 18/87  bone]
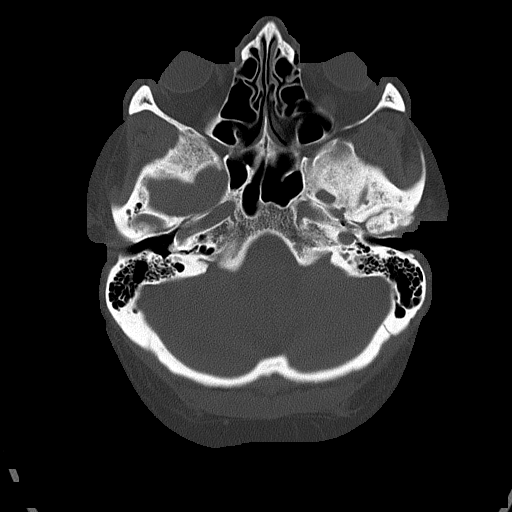
[im 26/87  bone]
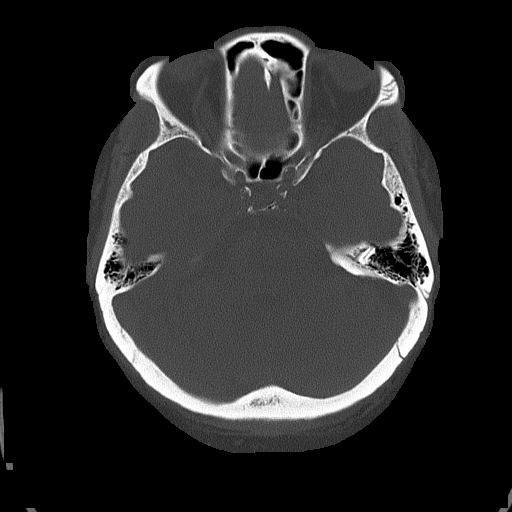

[Series 5: head without cor · coronal · non-contrast · 0.33mm/px · 3 of 69 slices shown]
[im 23/69  brain]
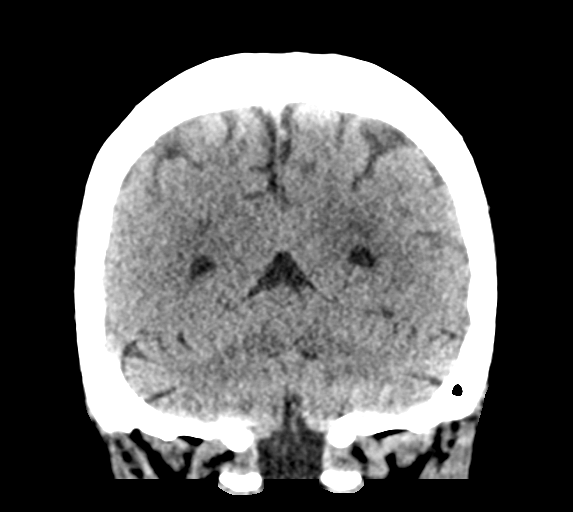
[im 31/69  brain]
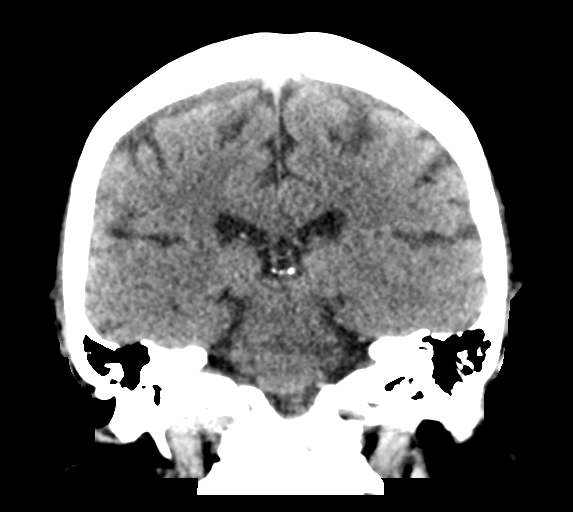
[im 38/69  brain]
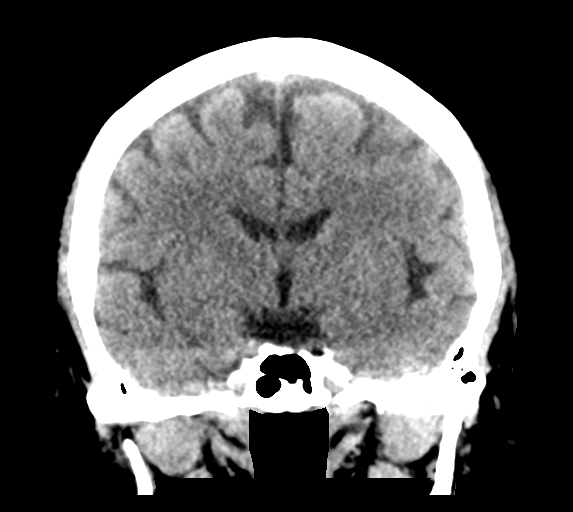

[Series 6: head without sag · sagittal · non-contrast · 0.33mm/px · 3 of 62 slices shown]
[im 21/62  brain]
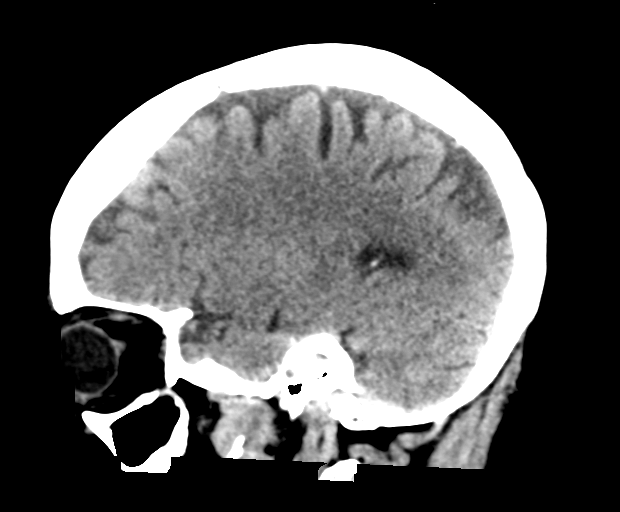
[im 31/62  brain]
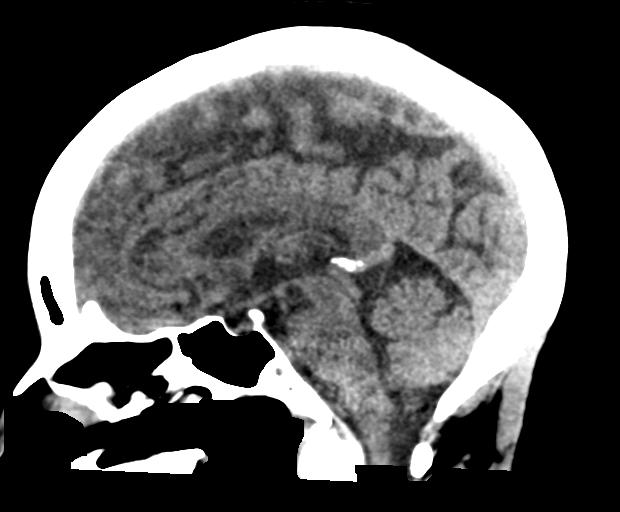
[im 41/62  brain]
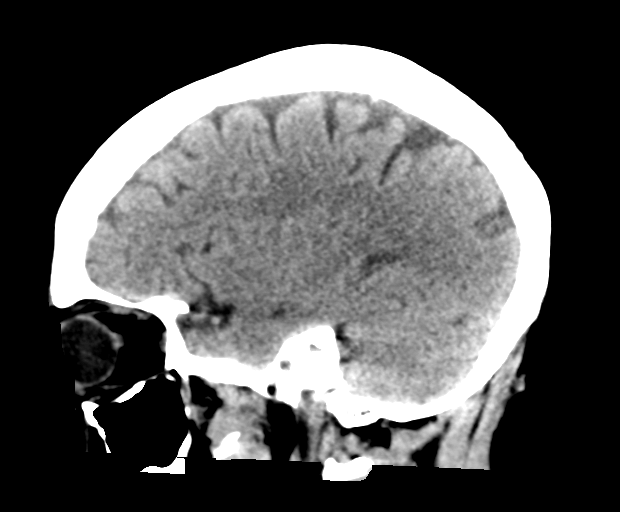

[16 of 47 positions shown; findings below may reference images not displayed]

FINDINGS: Brain: There is no mass, hemorrhage or extra-axial collection. The
size and configuration of the ventricles and extra-axial CSF spaces
are normal. There is no acute or chronic infarction. The brain
parenchyma is normal.

Vascular: No abnormal hyperdensity of the major intracranial
arteries or dural venous sinuses. No intracranial atherosclerosis.

Skull: The visualized skull base, calvarium and extracranial soft
tissues are normal.

Sinuses/Orbits: No fluid levels or advanced mucosal thickening of
the visualized paranasal sinuses. No mastoid or middle ear effusion.
The orbits are normal.
IMPRESSION: Normal head CT.

## 2019-06-10 ENCOUNTER — Telehealth: Payer: Self-pay

## 2019-06-10 MED ORDER — FUROSEMIDE 20 MG PO TABS: ORAL_TABLET | ORAL | 1 refills | Status: DC

## 2019-06-10 NOTE — Telephone Encounter (Signed)
New message    The patient calling   1. Husband die this morning was a patient of Dr. Quay Burow  2. C/o swelling in Both ankle are still bad wants to discuss going back to lasix.

## 2019-06-10 NOTE — Telephone Encounter (Signed)
Give my condolences.    Stop hydrochlorothiazide  Start lasix 20 mg daily. Sent to [facility]

## 2019-06-10 NOTE — Telephone Encounter (Signed)
Pt aware of response below.  

## 2019-06-10 NOTE — Telephone Encounter (Signed)
Would you like her to come in for an OV for the leg swelling?

## 2019-06-17 ENCOUNTER — Encounter (INDEPENDENT_AMBULATORY_CARE_PROVIDER_SITE_OTHER): Payer: Medicare Other | Admitting: Ophthalmology

## 2019-07-03 ENCOUNTER — Other Ambulatory Visit: Payer: Self-pay

## 2019-07-03 ENCOUNTER — Encounter: Payer: Self-pay | Admitting: Cardiology

## 2019-07-03 ENCOUNTER — Ambulatory Visit (INDEPENDENT_AMBULATORY_CARE_PROVIDER_SITE_OTHER): Payer: Medicare Other | Admitting: Cardiology

## 2019-07-03 VITALS — BP 130/74 | HR 66 | Ht 62.0 in | Wt 195.0 lb

## 2019-07-03 DIAGNOSIS — E782 Mixed hyperlipidemia: Secondary | ICD-10-CM | POA: Diagnosis not present

## 2019-07-03 DIAGNOSIS — R600 Localized edema: Secondary | ICD-10-CM | POA: Diagnosis not present

## 2019-07-03 DIAGNOSIS — Z79899 Other long term (current) drug therapy: Secondary | ICD-10-CM

## 2019-07-03 DIAGNOSIS — Z789 Other specified health status: Secondary | ICD-10-CM | POA: Diagnosis not present

## 2019-07-03 DIAGNOSIS — I5032 Chronic diastolic (congestive) heart failure: Secondary | ICD-10-CM

## 2019-07-03 DIAGNOSIS — I2583 Coronary atherosclerosis due to lipid rich plaque: Secondary | ICD-10-CM | POA: Diagnosis not present

## 2019-07-03 DIAGNOSIS — I2510 Atherosclerotic heart disease of native coronary artery without angina pectoris: Secondary | ICD-10-CM

## 2019-07-03 MED ORDER — TORSEMIDE 20 MG PO TABS: 20.0000 mg | ORAL_TABLET | Freq: Every day | ORAL | 0 refills | Status: DC

## 2019-07-03 NOTE — Progress Notes (Signed)
Cardiology Office Note:    Date:  07/03/2019   ID:  Samantha, [name] 24-Sep-1940, MRN [id number]  PCP:  [name], MD  [place] Cardiologist:  [name].  [facility] Electrophysiologist:  None   Referring MD: [name], MD   Chief complaint: Lower extremity edema  History of Present Illness:    Samantha [name] is a 79 y.o. female with a hx of  CAD (moderate nonobstructive disease by cardiac CT 10/2014), TIA (on Plavix), chronic diastolic CHF, DM2, HTN, HL, fibromyalgia, palpitations secondary to symptomatic PVCs, LE edema, colon cancer (s/p lap partial colectomy 10/2014), reactive airway disease. Cardiac catheterization performed in February 2018 showed 40% mid RCA stenosis and 50% mid LAD stenosis. The patient continued to have intermittent exertional and resting chest pain that has resolved after she was started on Imdur 30 mg daily.  04/10/18 - 3 months follow up, the patient underwent back surgery and has been feeling great, she is experiencing much less pain and is able to perform limited exercises. Denies chest pain, SOB, no LE edema, tolerates medications well. She is struggling to take care of her husband who has progressively worsening symptoms of MS and unable to perform any ADL  10/17/2018 - 6 months follow up, she is doing well, she was started on Plaquenil for RA and it makes her feel tired. She denies chest pain, SOB, no LE edema, orthopnea, PND, she tolerates her meds well.  07/03/2019 -the patient is coming after 9 months, she has mourning the death of her husband who recently passed away.  He was also a patient.  She gets really good support from her extensive family.  She denies any chest pain or shortness of breath but has occasional shoulder pain when she is stressed.  She has been compliant with her medications and tolerates them well.  She was recently started on amlodipine for Raynaud's disease that caused worsening lower  extremity edema.  No orthopnea or proximal nocturnal dyspnea.  Past Medical History:  Diagnosis Date   A-fib [name])    Abnormal CT of the chest 2008   last CT4-l 2009:  . No f/u suggested    Allergy    Asthma    CAD (coronary artery disease)    a. Coronary CTA 10/16: Coronary Ca score 211, mod non-obstructive CAD with LM mild plaque (25-50%), mid LAD 50-69%. b. Neg nuc 06/2015.   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[place]) 2016   Minimally invasive right hemicolectomy to be done    Neuromuscular disorder ([place])    FIBROMYALGIA   Ocular migraine    OSA (obstructive sleep apnea) 09/2007   dx w/ a sleep study, not on  CPAP   Osteoarthritis    Osteoporosis    Pneumonia    "double" in 2004   PONV (postoperative nausea and vomiting)    Reactive airway disease 01/29/2002   dx of pseudoasthma / vcd in 2005 and nl sprirometry History of dyspnea, 2011,  improved after several medications were changed around Question of COPD, disproved July 06, 2009 with nl pft's       Rheumatoid factor positive    Shingles 11/2009   Sleep apnea    Stroke ([place])    TIA (transient ischemic attack)    x2 - on Plavix for this   Torn rotator cuff    right worse than left, both are torn   Tumor, 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 Pernell Dupre (done after colon surgery)   CATARACT EXTRACTION W/ INTRAOCULAR LENS  IMPLANT, BILATERAL  2012   COLON SURGERY  10/2014   EYE SURGERY Bilateral    torq lens for cataracts   LEFT HEART CATH AND CORONARY ANGIOGRAPHY N/A 03/22/2016   Procedure: Left Heart Cath and Coronary Angiography;  Surgeon: Belva Crome, MD;  Location: [facility];  Service: Cardiovascular;  Laterality: N/A;   LUMBAR LAMINECTOMY/DECOMPRESSION MICRODISCECTOMY N/A 01/20/2018   Procedure: L4-5 decompression;  Surgeon: Marybelle Killings, MD;  Location: [place];  Service: Orthopedics;  Laterality: N/A;   THYROIDECTOMY, PARTIAL  1960's   VAGINAL MASS EXCISION  1994   "Laser surgery for vaginal cancer; followed by chemotherapy" (06/27/2012)    Current Medications: Current Meds  Medication Sig   acetaminophen (TYLENOL) 500 MG tablet Take 500 mg by mouth every 6 (six) hours as needed.   albuterol (PROVENTIL HFA;VENTOLIN HFA) 108 (90 Base) MCG/ACT inhaler Inhale 2 puffs into the lungs every 6 (six) hours as needed for wheezing or shortness of breath.   amLODipine (NORVASC) 5 MG tablet    budesonide-formoterol (SYMBICORT) 80-4.5 MCG/ACT inhaler Inhale 2 puffs into the lungs 2 (two) times daily.   butalbital-acetaminophen-caffeine (FIORICET) 50-325-40 MG tablet Take 1 tablet by mouth every 6 (six) hours as needed for headache or migraine.   BYSTOLIC 10 MG tablet TAKE 1 TABLET(10 MG) BY MOUTH DAILY   CALCIUM PO Take 1 tablet by mouth every morning.    cetirizine (ZYRTEC) 10 MG tablet TAKE 1 TABLET(10 MG) BY MOUTH DAILY   cholecalciferol (VITAMIN D) 1000 UNITS tablet Take 1,000 Units by mouth every morning.    cholestyramine (QUESTRAN) 4 g packet Take 1 packet (4 g total) by mouth daily.   clopidogrel (PLAVIX) 75 MG tablet TAKE 1 TABLET(75 MG) BY MOUTH DAILY   Evolocumab (REPATHA SURECLICK) 151 MG/ML SOAJ Inject 1 pen into the  skin every 14 (fourteen) days.   hydrocortisone 2.5 % cream Apply topically 2 (two) times daily.   hydroxychloroquine (PLAQUENIL) 200 MG tablet hydroxychloroquine 200 mg tablet   isosorbide mononitrate (IMDUR) 60 MG 24 hr tablet TAKE 1 TABLET(60 MG) BY MOUTH DAILY   lisinopril (ZESTRIL) 40 MG tablet Take 1 tablet (40 mg total) by mouth daily.   Magnesium 100 MG CAPS Take by mouth.   metFORMIN (GLUCOPHAGE-XR) 500 MG 24 hr tablet TAKE 1 TABLET(500 MG) BY MOUTH DAILY WITH BREAKFAST   Polyethyl Glycol-Propyl Glycol (SYSTANE) 0.4-0.3 % GEL Place 1 application into both eyes daily as needed (for dry eyes).    Potassium 99 MG TABS Take  99 mg by mouth daily.    pregabalin (LYRICA) 75 MG capsule Take 1 capsule (75 mg total) by mouth at bedtime.   [DISCONTINUED] furosemide (LASIX) 20 MG tablet TAKE 1 TABLET(20 MG) BY MOUTH DAILY     Allergies:   Bactrim [sulfamethoxazole-trimethoprim], Cefuroxime axetil, Oxycodone, Pravastatin, Seldane [terfenadine], Zocor [simvastatin], Tramadol, Atorvastatin, Lime flavor [flavoring agent], Rosuvastatin, and Tape   Social History   Socioeconomic History   Marital status: Married    Spouse name: Ilona Sorrel   Number of children: 2   Years of education: masters   Highest education level: Not on file  Occupational History   Occupation: Retired, disable since 2000    Employer: RETIRED  Tobacco Use   Smoking status: Former Smoker    Packs/day: 0.25    Years: 5.00    Pack years: 1.25    Types: Cigarettes    Quit date: 01/29/1998    Years since quitting: 21.4   Smokeless tobacco: Never Used   Tobacco comment: Quit in 2001  Substance and Sexual Activity   Alcohol use: No    Alcohol/week: 0.0 standard drinks   Drug use: No   Sexual activity: Never  Other Topics Concern   Not on file  Social History Narrative   On disability since 2000--- also husband has MS   Education. College.   Right handed.   Social Determinants of Health    Financial Resource Strain:    Difficulty of Paying Living Expenses:   Food Insecurity:    Worried About Charity fundraiser in the Last Year:    Arboriculturist in the Last Year:   Transportation Needs:    Film/video editor (Medical):    Lack of Transportation (Non-Medical):   Physical Activity: Sufficiently Active   Days of Exercise per Week: 5 days   Minutes of Exercise per Session: 30 min  Stress: Stress Concern Present   Feeling of Stress : To some extent  Social Connections:    Frequency of Communication with Friends and Family:    Frequency of Social Gatherings with Friends and Family:    Attends Religious Services:    Active Member of Clubs or Organizations:    Attends Archivist Meetings:    Marital Status:      Family History: The patient's family history includes Allergies in her sister; Aneurysm in her brother; Asthma in her paternal grandmother and sister; Diabetes in her brother and brother; Emphysema in her brother; Heart disease in her brother, father, mother, and another family member; Kidney failure in her brother; Lung cancer in her mother; Parkinsonism in her sister; Stroke in her brother, maternal grandmother, and paternal grandmother. There is no history of Breast cancer, Colon cancer, Heart attack, Esophageal cancer, Rectal cancer, or Stomach cancer.  ROS:   Please see the history of present illness.    All other systems reviewed and are negative.  EKGs/Labs/Other Studies Reviewed:    The following studies were reviewed today:  EKG:  EKG is ordered today.  The ekg ordered today demonstrates normal sinus rhythm, poor R wave progressions in the precordial leads, unchanged from prior.  This was personally reviewed.  Recent Labs: 10/01/2018: ALT 13; TSH 2.980 02/12/2019: BUN 20; Creatinine, Ser 0.90; Hemoglobin 12.7; Platelets 170; Potassium 4.1; Sodium 138  Recent Lipid Panel    Component Value Date/Time   CHOL 135 10/01/2018  1036   TRIG 93 10/01/2018 1036   HDL 84 10/01/2018 1036   CHOLHDL 1.6 10/01/2018 1036  CHOLHDL 3 05/05/2018 1149   VLDL 22.4 05/05/2018 1149   LDLCALC 34 10/01/2018 1036   LDLDIRECT 126.2 12/07/2010 1039    Physical Exam:    VS:  BP 130/74    Pulse 66    Ht 5\' 2"  (1.575 m)    Wt 195 lb (88.5 kg)    SpO2 98%    BMI 35.67 kg/m     Wt Readings from Last 3 Encounters:  07/03/19 195 lb (88.5 kg)  05/06/19 193 lb (87.5 kg)  04/20/19 191 lb (86.6 kg)    GEN:  Well nourished, well developed in no acute distress HEENT: Normal NECK: No JVD; No carotid bruits LYMPHATICS: No lymphadenopathy CARDIAC: RRR, no murmurs, rubs, gallops RESPIRATORY:  Clear to auscultation without rales, wheezing or rhonchi  ABDOMEN: Soft, non-tender, non-distended MUSCULOSKELETAL: 1+ bilateral lower extremity edema up to mid calves; No deformity  SKIN: Warm and dry NEUROLOGIC:  Alert and oriented x 3 PSYCHIATRIC:  Normal affect   ASSESSMENT:    1. Coronary artery disease due to lipid rich plaque   2. Chronic diastolic CHF (congestive heart failure), NYHA class 2 ([place])   3. Medication management   4. Bilateral lower extremity edema   5. Statin intolerance   6. Mixed hyperlipidemia    PLAN:    In order of problems listed above:  Lower extremity edema -After starting amlodipine, I will change Lasix to torsemide 20 mg daily and she is directed to take extra 20 mg in the afternoon if needed.  We will recheck her BMP in 2 weeks.  Hypertension- well controlled.  CAD: Left heart cath in 03/2016 showed 40% mid RCA and 50% mid LAD stenosis.  Continue plavix (on for TIA), statin and BB. Asymptomatic.  Acute on chronic chronic diastolic CHF: Mild lower extremity edema, clear lungs, this is most probably secondary to amlodipine.  We will follow her in 3 months.  HLD: continue statin. HDL 84, LDL 34, TG: 93. Normal LFTS on 10/01/2018. Amazing results!  She is tolerating Repatha well.  Hx of TIA: continue  plavix and statin, only if absolutely necessary if her epistaxis continues we will discontinue Plavix.  Polycystic kidney disease -with normal kidney function, we will refer to nephrology.   Medication Adjustments/Labs and Tests Ordered: Current medicines are reviewed at length with the patient today.  Concerns regarding medicines are outlined above.  Orders Placed This Encounter  Procedures   Basic metabolic panel   EKG 97-QBHA   Meds ordered this encounter  Medications   torsemide (DEMADEX) 20 MG tablet    Sig: Take 1 tablet (20 mg total) by mouth daily.    Dispense:  90 tablet    Refill:  0    Patient Instructions  Medication Instructions:   STOP TAKING FUROSEMIDE (LASIX) NOW  START TAKING TORSEMIDE 20 MG BY MOUTH DAILY  *If you need a refill on your cardiac medications before your next appointment, please call your pharmacy*   Lab Work:  IN 2 [place] OFFICE--TO CHECK A BMET  If you have labs (blood work) drawn today and your tests are completely normal, you will receive your results only by:  [place] (if you have MyChart) OR  A paper copy in the mail If you have any lab test that is abnormal or we need to change your treatment, we will call you to review the results.   Follow-Up:  3 MONTHS IN THE OFFICE WITH [name]     Signed, [place] Siren  Meda Coffee, MD  07/03/2019 10:22 AM    [facility]

## 2019-07-03 NOTE — Patient Instructions (Signed)
Medication Instructions:   STOP TAKING FUROSEMIDE (LASIX) NOW  START TAKING TORSEMIDE 20 MG BY MOUTH DAILY  *If you need a refill on your cardiac medications before your next appointment, please call your pharmacy*   Lab Work:  IN 2 [place] OFFICE--TO CHECK A BMET  If you have labs (blood work) drawn today and your tests are completely normal, you will receive your results only by: [name] MyChart Message (if you have MyChart) OR . A paper copy in the mail If you have any lab test that is abnormal or we need to change your treatment, we will call you to review the results.   Follow-Up:  3 MONTHS IN THE OFFICE WITH [name]

## 2019-07-09 ENCOUNTER — Other Ambulatory Visit: Payer: Self-pay | Admitting: Internal Medicine

## 2019-07-13 ENCOUNTER — Other Ambulatory Visit: Payer: Self-pay | Admitting: Internal Medicine

## 2019-07-13 NOTE — Telephone Encounter (Signed)
Check [place] registry last filled 05/06/2019.Marland KitchenJohny [name]

## 2019-07-15 ENCOUNTER — Encounter (INDEPENDENT_AMBULATORY_CARE_PROVIDER_SITE_OTHER): Payer: Self-pay | Admitting: Ophthalmology

## 2019-07-15 ENCOUNTER — Ambulatory Visit (INDEPENDENT_AMBULATORY_CARE_PROVIDER_SITE_OTHER): Payer: Medicare Other | Admitting: Ophthalmology

## 2019-07-15 ENCOUNTER — Other Ambulatory Visit: Payer: Self-pay

## 2019-07-15 DIAGNOSIS — H43812 Vitreous degeneration, left eye: Secondary | ICD-10-CM

## 2019-07-15 DIAGNOSIS — E119 Type 2 diabetes mellitus without complications: Secondary | ICD-10-CM | POA: Diagnosis not present

## 2019-07-15 DIAGNOSIS — H18831 Recurrent erosion of cornea, right eye: Secondary | ICD-10-CM | POA: Diagnosis not present

## 2019-07-15 DIAGNOSIS — H43811 Vitreous degeneration, right eye: Secondary | ICD-10-CM

## 2019-07-15 DIAGNOSIS — G43109 Migraine with aura, not intractable, without status migrainosus: Secondary | ICD-10-CM | POA: Diagnosis not present

## 2019-07-15 NOTE — Assessment & Plan Note (Signed)

## 2019-07-15 NOTE — Progress Notes (Signed)
07/15/2019     CHIEF COMPLAINT Patient presents for Diabetic Eye Exam   HISTORY OF PRESENT ILLNESS: Samantha [name] is a 79 y.o. female who presents to the clinic today for:   HPI    Diabetic Eye Exam    Vision is stable.  Associated Symptoms Pain.  Diabetes characteristics include Type 2.  Blood sugar level is controlled.  Last Blood Glucose 110.  Last A1C 5.8.  I, the attending physician,  performed the HPI with the patient and updated documentation appropriately.          Comments    Pt needs retina eval due to new medications. Pt is now taking amlodipine and torsemide. Pt c/o OD having a very sharp pain about 10 days ago. Pt state OD was very painful/uncomfortable for about 3 days. Pt states OD was scratchy and she used gtts. 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 (History)      Referring physician: [name], MD [place],  [place] [address]  HISTORICAL INFORMATION:   Selected notes from the MEDICAL RECORD NUMBER    Lab Results  Component Value Date   HGBA1C 5.9 (A) 05/06/2019     CURRENT MEDICATIONS: Current Outpatient Medications (Ophthalmic Drugs)  Medication Sig  . Polyethyl Glycol-Propyl Glycol (SYSTANE) 0.4-0.3 % GEL Place 1 application into both eyes daily as needed (for dry eyes).    No current facility-administered medications for this visit. (Ophthalmic Drugs)   Current Outpatient Medications (Other)  Medication Sig  . acetaminophen (TYLENOL) 500 MG tablet Take 500 mg by mouth every 6 (six) hours as needed.  [name] albuterol (PROVENTIL HFA;VENTOLIN HFA) 108 (90 Base) MCG/ACT inhaler Inhale 2 puffs into the lungs every 6 (six) hours as needed for wheezing or shortness of breath.  [name] amLODipine (NORVASC) 5 MG tablet   . budesonide-formoterol  (SYMBICORT) 80-4.5 MCG/ACT inhaler Inhale 2 puffs into the lungs 2 (two) times daily.  . butalbital-acetaminophen-caffeine (FIORICET) 50-325-40 MG tablet Take 1 tablet by mouth every 6 (six) hours as needed for headache or migraine.  [name] BYSTOLIC 10 MG tablet TAKE 1 TABLET(10 MG) BY MOUTH DAILY  . CALCIUM PO Take 1 tablet by mouth every morning.   . cetirizine (ZYRTEC) 10 MG tablet TAKE 1 TABLET(10 MG) BY MOUTH DAILY  . cholecalciferol (VITAMIN D) 1000 UNITS tablet Take 1,000 Units by mouth every morning.   . cholestyramine (QUESTRAN) 4 g packet Take 1 packet (4 g total) by mouth daily.  . clopidogrel (PLAVIX) 75 MG tablet TAKE 1 TABLET(75 MG) BY MOUTH DAILY  . Evolocumab (REPATHA SURECLICK) 443 MG/ML SOAJ Inject 1 pen into the skin every 14 (fourteen) days.  . hydrocortisone 2.5 % cream Apply topically 2 (two) times daily.  . hydroxychloroquine (PLAQUENIL) 200 MG tablet hydroxychloroquine 200 mg tablet  . isosorbide mononitrate (IMDUR) 60 MG 24 hr tablet TAKE 1 TABLET(60 MG) BY MOUTH DAILY  . lisinopril (ZESTRIL) 40 MG tablet Take 1 tablet (40 mg total) by mouth daily.  . Magnesium 100 MG CAPS Take by mouth.  . metFORMIN (GLUCOPHAGE-XR) 500 MG 24 hr tablet TAKE 1 TABLET(500 MG) BY MOUTH DAILY WITH BREAKFAST  . Potassium 99 MG TABS Take 99 mg by mouth daily.   . pregabalin (  LYRICA) 75 MG capsule TAKE 1 CAPSULE(75 MG) BY MOUTH AT BEDTIME  . torsemide (DEMADEX) 20 MG tablet Take 1 tablet (20 mg total) by mouth daily.   No current facility-administered medications for this visit. (Other)      REVIEW OF SYSTEMS: ROS    Positive for: Endocrine   Last edited by Tilda Franco on 07/15/2019 10:07 AM. (History)       ALLERGIES Allergies  Allergen Reactions  . Bactrim [Sulfamethoxazole-Trimethoprim] Other (See Comments)    See OV 09-15-13, rash-tongue swelling due to bactrim ?  [name] Cefuroxime Axetil Hives  . Oxycodone Nausea And Vomiting  . Pravastatin Other (See Comments)    "muscle  breakdown" with profuse sweating  . Seldane [Terfenadine] Hives  . Zocor [Simvastatin] Other (See Comments)    2012 "Muscle breakdown " with profuse sweating  . Tramadol Nausea And Vomiting  . Atorvastatin Other (See Comments)    Pt reports muscle aches and joint pains  . Lime Flavor [Flavoring Agent] Diarrhea  . Rosuvastatin Other (See Comments)    Pt reports "myalgias, fatigue, nosebleeds."  . Tape Other (See Comments)    blisters    PAST MEDICAL HISTORY Past Medical History:  Diagnosis Date  . A-fib ([place])   . Abnormal CT of the chest 2008   last CT4-l 2009:  . No f/u suggested   . Allergy   . Asthma   . CAD (coronary artery disease)    a. Coronary CTA 10/16: Coronary Ca score 211, mod non-obstructive CAD with LM mild plaque (25-50%), mid LAD 50-69%. b. Neg nuc 06/2015.  . Cataract    BILATERAL-REMOVED  . Chronic diastolic CHF (congestive heart failure) ([place])   . Collagen vascular disease ([place])    "arterial sclerosis" per pt  . Complication of anesthesia    trouble waking up  . Fibromyalgia   . GERD (gastroesophageal reflux disease)   . H/O hiatal hernia   . Heart murmur   . Hyperlipidemia   . Hypertension   . 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 Neuromuscular disorder ([facility])    FIBROMYALGIA  . Ocular migraine   . OSA (obstructive sleep apnea) 09/2007   dx w/ a sleep study, not on  CPAP  . Osteoarthritis   . Osteoporosis   . Pneumonia    "double" in 2004  . PONV (postoperative nausea and vomiting)   . Reactive airway disease 01/29/2002   dx of pseudoasthma / vcd in 2005 and nl sprirometry History of dyspnea, 2011,  improved after several medications were changed around Question of COPD, disproved July 06, 2009 with nl pft's      . Rheumatoid factor positive   . Shingles 11/2009  . Sleep apnea   . Stroke ([place])   . TIA (transient ischemic attack)    x2 - on Plavix for this  . Torn rotator cuff     right worse than left, both are torn  . Tumor, 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  . Type II diabetes mellitus ([place])   . 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 Vaginal dysplasia    Past Surgical History:  Procedure Laterality Date  . ABDOMINAL HYSTERECTOMY  1980   NO oophorectomy per pt   . ANTERIOR CERVICAL DECOMP/DISCECTOMY FUSION  2001   C 3, C4 and C5 plate and screws  . BREAST BIOPSY Right 1999  . BUNIONECTOMY Left ~ 1977  .  CARDIAC CATHETERIZATION     2018 By Dr. Pernell Dupre (done after colon surgery)  . CATARACT EXTRACTION W/ INTRAOCULAR LENS  IMPLANT, BILATERAL  2012  . COLON SURGERY  10/2014  . EYE SURGERY Bilateral    torq lens for cataracts  . LEFT HEART CATH AND CORONARY ANGIOGRAPHY N/A 03/22/2016   Procedure: Left Heart Cath and Coronary Angiography;  Surgeon: Belva Crome, MD;  Location: [facility];  Service: Cardiovascular;  Laterality: N/A;  . LUMBAR LAMINECTOMY/DECOMPRESSION MICRODISCECTOMY N/A 01/20/2018   Procedure: L4-5 decompression;  Surgeon: Marybelle Killings, MD;  Location: [facility];  Service: Orthopedics;  Laterality: N/A;  . THYROIDECTOMY, PARTIAL  1960's  . VAGINAL MASS EXCISION  1994   "Laser surgery for vaginal cancer; followed by chemotherapy" (06/27/2012)    FAMILY HISTORY Family History  Problem Relation Age of Onset  . Heart disease Father   . Heart disease Mother   . Lung cancer Mother   . Allergies Sister   . Parkinsonism Sister        possible  . Asthma Sister   . Asthma Paternal Grandmother   . Stroke Paternal Grandmother   . Heart disease Other        paternal grandparents, maternal grandparents,   . Heart disease Brother   . Emphysema Brother   . Aneurysm Brother        x3  . Kidney failure Brother   . Diabetes Brother   . Diabetes Brother   . Stroke Brother   . Stroke Maternal Grandmother   . Breast cancer Neg Hx   . Colon cancer Neg Hx   . Heart attack Neg Hx   . Esophageal cancer Neg Hx   . Rectal cancer Neg Hx   .  Stomach cancer Neg Hx     SOCIAL HISTORY Social History   Tobacco Use  . Smoking status: Former Smoker    Packs/day: 0.25    Years: 5.00    Pack years: 1.25    Types: Cigarettes    Quit date: 01/29/1998    Years since quitting: 21.4  . Smokeless tobacco: Never Used  . Tobacco comment: Quit in 2001  Vaping Use  . Vaping Use: Never used  Substance Use Topics  . Alcohol use: No    Alcohol/week: 0.0 standard drinks  . Drug use: No         OPHTHALMIC EXAM:  Base Eye Exam    Visual Acuity (Snellen - Linear)      Right Left   Dist [place] 20/30 + 20/25 +       Tonometry (Tonopen, 10:12 AM)      Right Left   Pressure 12 13       Pupils      Pupils Dark Light Shape React APD   Right PERRL 3.5 3 Round Slow None   Left PERRL 3.5 3 Round Slow None       Visual Fields (Counting fingers)      Left Right    Full Full       Neuro/Psych    Oriented x3: Yes   Mood/Affect: Normal       Dilation    Both eyes: 1.0% Mydriacyl, 2.5% Phenylephrine @ 10:12 AM        Slit Lamp and Fundus Exam    External Exam      Right Left   External Normal Normal       Slit Lamp Exam      Right  Left   Lids/Lashes Normal Normal   Conjunctiva/Sclera White and quiet White and quiet   Cornea Clear Clear   Anterior Chamber Deep and quiet Deep and quiet   Iris Round and reactive Round and reactive   Lens Centered posterior chamber intraocular lens Centered posterior chamber intraocular lens   Anterior Vitreous Normal Normal       Fundus Exam      Right Left   Posterior Vitreous Posterior vitreous detachment Posterior vitreous detachment   Disc Normal Normal   C/D Ratio 0.25 0.25   Macula Normal Normal   Vessels no DR no DR   Periphery Normal Normal          IMAGING AND PROCEDURES  Imaging and Procedures for 07/15/19  OCT, Retina - OU - Both Eyes       Right Eye Quality was good. Scan locations included subfoveal. Central Foveal Thickness: 224. Progression has been  stable. Findings include normal observations.   Left Eye Quality was good. Scan locations included subfoveal. Central Foveal Thickness: 222. Progression has been stable. Findings include normal observations.   Notes Posterior vitreous detachment OU normal fovea OU                ASSESSMENT/PLAN:  Diabetes mellitus without complication ([place]) The patient has diabetes without any evidence of retinopathy. The patient advised to maintain good blood glucose control, excellent blood pressure control, and favorable levels of cholesterol, low density lipoprotein, and high density lipoproteins. Follow up in 1 year was recommended. Explained that fluctuations in visual acuity , or "out of focus", may result from large variations of blood sugar control.  Posterior vitreous detachment of left eye   The nature of posterior vitreous detachment was discussed with the patient as well as its physiology, its age prevalence, and its possible implication regarding retinal breaks and detachment.  An informational brochure was given to the patient.  All the patient's questions were answered.  The patient was asked to return if new or different flashes or floaters develops.   Patient was instructed to contact office immediately if any changes were noticed. I explained to the patient that vitreous inside the eye is similar to jello inside a bowl. As the jello melts it can start to pull away from the bowl, similarly the vitreous throughout our lives can begin to pull away from the retina. That process is called a posterior vitreous detachment. In some cases, the vitreous can tug hard enough on the retina to form a retinal tear. I discussed with the patient the signs and symptoms of a retinal detachment.  Do not rub the eye.      ICD-10-CM   1. Posterior vitreous detachment of right eye  H43.811 OCT, Retina - OU - Both Eyes  2. Posterior vitreous detachment of left eye  H43.812 OCT, Retina - OU - Both Eyes  3.  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 Diabetes mellitus without complication ([place])  W46.6   5. 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  2.  3.  Ophthalmic Meds Ordered this visit:  No orders of the defined types were placed in this encounter.      Return in about 1 year (around 07/14/2020) for DILATE OU, OCT.  Patient Instructions  Patient instructed to use topical ophthalmic ointment nightly, over-the-counter from the pharmacy in order to prevent recurrent corneal erosion.  Use of this for next 6 months will likely to be helpful.  Explained the diagnoses, plan, and follow up with the patient and they expressed understanding.  Patient expressed understanding of the importance of proper follow up care.   Clent Demark Lashuna Tamashiro M.D. Diseases & Surgery of the Retina and Vitreous [facility] [place] 07/15/19     Abbreviations: M myopia (nearsighted); A astigmatism; H hyperopia (farsighted); P presbyopia; Mrx spectacle prescription;  CTL contact lenses; OD right eye; OS left eye; OU both eyes  XT exotropia; ET esotropia; PEK punctate epithelial keratitis; PEE punctate epithelial erosions; DES dry eye syndrome; MGD meibomian gland dysfunction; ATs artificial tears; PFAT's preservative free artificial tears; [place] nuclear sclerotic cataract; PSC posterior subcapsular cataract; ERM epi-retinal membrane; PVD posterior vitreous detachment; RD retinal detachment; DM diabetes mellitus; DR diabetic retinopathy; NPDR non-proliferative diabetic retinopathy; PDR proliferative diabetic retinopathy; CSME clinically significant macular edema; DME diabetic macular edema; dbh dot blot hemorrhages; CWS cotton wool spot; POAG primary open angle glaucoma; C/D cup-to-disc ratio; HVF humphrey visual field; GVF goldmann visual field; OCT optical coherence tomography; IOP intraocular pressure; BRVO Branch retinal vein occlusion; CRVO central retinal vein occlusion; CRAO central retinal artery occlusion;  BRAO branch retinal artery occlusion; RT retinal tear; SB scleral buckle; PPV pars plana vitrectomy; VH Vitreous hemorrhage; PRP panretinal laser photocoagulation; IVK intravitreal kenalog; VMT vitreomacular traction; MH Macular hole;  NVD neovascularization of the disc; NVE neovascularization elsewhere; AREDS age related eye disease study; ARMD age related macular degeneration; POAG primary open angle glaucoma; EBMD epithelial/anterior basement membrane dystrophy; ACIOL anterior chamber intraocular lens; IOL intraocular lens; PCIOL posterior chamber intraocular lens; Phaco/IOL phacoemulsification with intraocular lens placement; [place] photorefractive keratectomy; LASIK laser assisted in situ keratomileusis; HTN hypertension; DM diabetes mellitus; COPD chronic obstructive pulmonary disease

## 2019-07-15 NOTE — Assessment & Plan Note (Signed)

## 2019-07-15 NOTE — Patient Instructions (Signed)
Patient instructed to use topical ophthalmic ointment nightly, over-the-counter from the pharmacy in order to prevent recurrent corneal erosion.  Use of this for next 6 months will likely to be helpful.

## 2019-07-17 ENCOUNTER — Other Ambulatory Visit: Payer: Medicare Other | Admitting: *Deleted

## 2019-07-17 ENCOUNTER — Other Ambulatory Visit: Payer: Self-pay

## 2019-07-17 DIAGNOSIS — Z79899 Other long term (current) drug therapy: Secondary | ICD-10-CM

## 2019-07-17 DIAGNOSIS — I5032 Chronic diastolic (congestive) heart failure: Secondary | ICD-10-CM

## 2019-07-17 DIAGNOSIS — I2583 Coronary atherosclerosis due to lipid rich plaque: Secondary | ICD-10-CM

## 2019-07-17 DIAGNOSIS — I2510 Atherosclerotic heart disease of native coronary artery without angina pectoris: Secondary | ICD-10-CM

## 2019-07-18 LAB — BASIC METABOLIC PANEL
BUN/Creatinine Ratio: 26 (ref 12–28)
BUN: 27 mg/dL (ref 8–27)
CO2: 27 mmol/L (ref 20–29)
Calcium: 9.3 mg/dL (ref 8.7–10.3)
Chloride: 102 mmol/L (ref 96–106)
Creatinine, Ser: 1.04 mg/dL — ABNORMAL HIGH (ref 0.57–1.00)
GFR calc Af Amer: 60 mL/min/{1.73_m2} (ref >59)
GFR calc non Af Amer: 52 mL/min/{1.73_m2} — ABNORMAL LOW (ref >59)
Glucose: 119 mg/dL — ABNORMAL HIGH (ref 65–99)
Potassium: 4.5 mmol/L (ref 3.5–5.2)
Sodium: 141 mmol/L (ref 134–144)

## 2019-08-14 ENCOUNTER — Other Ambulatory Visit: Payer: Self-pay | Admitting: Internal Medicine

## 2019-08-27 ENCOUNTER — Ambulatory Visit (INDEPENDENT_AMBULATORY_CARE_PROVIDER_SITE_OTHER): Payer: Medicare Other

## 2019-08-27 ENCOUNTER — Telehealth: Payer: Self-pay | Admitting: Internal Medicine

## 2019-08-27 DIAGNOSIS — Z0000 Encounter for general adult medical examination without abnormal findings: Secondary | ICD-10-CM | POA: Diagnosis not present

## 2019-08-27 MED ORDER — PREGABALIN 75 MG PO CAPS: ORAL_CAPSULE | ORAL | 0 refills | Status: DC

## 2019-08-27 MED ORDER — CLOPIDOGREL BISULFATE 75 MG PO TABS: ORAL_TABLET | ORAL | 1 refills | Status: DC

## 2019-08-27 MED ORDER — CETIRIZINE HCL 10 MG PO TABS: ORAL_TABLET | ORAL | 3 refills | Status: DC

## 2019-08-27 NOTE — Patient Instructions (Addendum)
Samantha [name] , Thank you for taking time to come for your Medicare Wellness Visit. I appreciate your ongoing commitment to your health goals. Please review the following plan we discussed and let me know if I can assist you in the future.   Screening recommendations/referrals: Colonoscopy: 04/20/2019; due every 5 years Mammogram: 06/12/2016; scheduled for 2021 Bone Density: 06/11/2016; scheduled for 2021 Recommended yearly ophthalmology/optometry visit for glaucoma screening and checkup Recommended yearly dental visit for hygiene and checkup  Vaccinations: Influenza vaccine: 11/05/2018 Pneumococcal vaccine: completed Tdap vaccine: 08/14/2011; due every 10 years Shingles vaccine: never done; will check local pharmacy   Covid-19: completed  Advanced directives: Please bring a copy of your health care power of attorney and living will to the office at your convenience.  Conditions/risks identified: Yes; Please continue to do your personal lifestyle choices by: daily care of teeth and gums, regular physical activity (goal should be 5 days a week for 30 minutes), eat a healthy diet, avoid tobacco and drug use, limiting any alcohol intake, taking a low-dose aspirin (if not allergic or have been advised by your provider otherwise) and taking vitamins and minerals as recommended by your provider. Continue doing brain stimulating activities (puzzles, reading, adult coloring books, staying active) to keep memory sharp. Continue to eat heart healthy diet (full of fruits, vegetables, whole grains, lean protein, water--limit salt, fat, and sugar intake) and increase physical activity as tolerated.  Next appointment: Please schedule your next Medicare Wellness Visit with your Nurse Health Advisor in 1 year.    Preventive Care 2 Years and Older, Female Preventive care refers to lifestyle choices and visits with your health care provider that can promote health and wellness. What does preventive care  include?  A yearly physical exam. This is also called an annual well check.  Dental exams once or twice a year.  Routine eye exams. Ask your health care provider how often you should have your eyes checked.  Personal lifestyle choices, including:  Daily care of your teeth and gums.  Regular physical activity.  Eating a healthy diet.  Avoiding tobacco and drug use.  Limiting alcohol use.  Practicing safe sex.  Taking low-dose aspirin every day.  Taking vitamin and mineral supplements as recommended by your health care provider. What happens during an annual well check? The services and screenings done by your health care provider during your annual well check will depend on your age, overall health, lifestyle risk factors, and family history of disease. Counseling  Your health care provider may ask you questions about your:  Alcohol use.  Tobacco use.  Drug use.  Emotional well-being.  Home and relationship well-being.  Sexual activity.  Eating habits.  History of falls.  Memory and ability to understand (cognition).  Work and work Statistician.  Reproductive health. Screening  You may have the following tests or measurements:  Height, weight, and BMI.  Blood pressure.  Lipid and cholesterol levels. These may be checked every 5 years, or more frequently if you are over 48 years old.  Skin check.  Lung cancer screening. You may have this screening every year starting at age 24 if you have a 30-pack-year history of smoking and currently smoke or have quit within the past 15 years.  Fecal occult blood test (FOBT) of the stool. You may have this test every year starting at age 25.  Flexible sigmoidoscopy or colonoscopy. You may have a sigmoidoscopy every 5 years or a colonoscopy every 10 years starting at age 50.  Hepatitis C blood test.  Hepatitis B blood test.  Sexually transmitted disease (STD) testing.  Diabetes screening. This is done by  checking your blood sugar (glucose) after you have not eaten for a while (fasting). You may have this done every 1-3 years.  Bone density scan. This is done to screen for osteoporosis. You may have this done starting at age 79.  Mammogram. This may be done every 1-2 years. Talk to your health care provider about how often you should have regular mammograms. Talk with your health care provider about your test results, treatment options, and if necessary, the need for more tests. Vaccines  Your health care provider may recommend certain vaccines, such as:  Influenza vaccine. This is recommended every year.  Tetanus, diphtheria, and acellular pertussis (Tdap, Td) vaccine. You may need a Td booster every 10 years.  Zoster vaccine. You may need this after age 41.  Pneumococcal 13-valent conjugate (PCV13) vaccine. One dose is recommended after age 66.  Pneumococcal polysaccharide (PPSV23) vaccine. One dose is recommended after age 78. Talk to your health care provider about which screenings and vaccines you need and how often you need them. This information is not intended to replace advice given to you by your health care provider. Make sure you discuss any questions you have with your health care provider. Document Released: 02/11/2015 Document Revised: 10/05/2015 Document Reviewed: 11/16/2014 Elsevier Interactive Patient Education  2017 [place] Prevention in the Home Falls can cause injuries. They can happen to people of all ages. There are many things you can do to make your home safe and to help prevent falls. What can I do on the outside of my home?  Regularly fix the edges of walkways and driveways and fix any cracks.  Remove anything that might make you trip as you walk through a door, such as a raised step or threshold.  Trim any bushes or trees on the path to your home.  Use bright outdoor lighting.  Clear any walking paths of anything that might make someone trip,  such as rocks or tools.  Regularly check to see if handrails are loose or broken. Make sure that both sides of any steps have handrails.  Any raised decks and porches should have guardrails on the edges.  Have any leaves, snow, or ice cleared regularly.  Use sand or salt on walking paths during winter.  Clean up any spills in your garage right away. This includes oil or grease spills. What can I do in the bathroom?  Use night lights.  Install grab bars by the toilet and in the tub and shower. Do not use towel bars as grab bars.  Use non-skid mats or decals in the tub or shower.  If you need to sit down in the shower, use a plastic, non-slip stool.  Keep the floor dry. Clean up any water that spills on the floor as soon as it happens.  Remove soap buildup in the tub or shower regularly.  Attach bath mats securely with double-sided non-slip rug tape.  Do not have throw rugs and other things on the floor that can make you trip. What can I do in the bedroom?  Use night lights.  Make sure that you have a light by your bed that is easy to reach.  Do not use any sheets or blankets that are too big for your bed. They should not hang down onto the floor.  Have a firm chair that has side  arms. You can use this for support while you get dressed.  Do not have throw rugs and other things on the floor that can make you trip. What can I do in the kitchen?  Clean up any spills right away.  Avoid walking on wet floors.  Keep items that you use a lot in easy-to-reach places.  If you need to reach something above you, use a strong step stool that has a grab bar.  Keep electrical cords out of the way.  Do not use floor polish or wax that makes floors slippery. If you must use wax, use non-skid floor wax.  Do not have throw rugs and other things on the floor that can make you trip. What can I do with my stairs?  Do not leave any items on the stairs.  Make sure that there are  handrails on both sides of the stairs and use them. Fix handrails that are broken or loose. Make sure that handrails are as long as the stairways.  Check any carpeting to make sure that it is firmly attached to the stairs. Fix any carpet that is loose or worn.  Avoid having throw rugs at the top or bottom of the stairs. If you do have throw rugs, attach them to the floor with carpet tape.  Make sure that you have a light switch at the top of the stairs and the bottom of the stairs. If you do not have them, ask someone to add them for you. What else can I do to help prevent falls?  Wear shoes that:  Do not have high heels.  Have rubber bottoms.  Are comfortable and fit you well.  Are closed at the toe. Do not wear sandals.  If you use a stepladder:  Make sure that it is fully opened. Do not climb a closed stepladder.  Make sure that both sides of the stepladder are locked into place.  Ask someone to hold it for you, if possible.  Clearly mark and make sure that you can see:  Any grab bars or handrails.  First and last steps.  Where the edge of each step is.  Use tools that help you move around (mobility aids) if they are needed. These include:  Canes.  Walkers.  Scooters.  Crutches.  Turn on the lights when you go into a dark area. Replace any light bulbs as soon as they burn out.  Set up your furniture so you have a clear path. Avoid moving your furniture around.  If any of your floors are uneven, fix them.  If there are any pets around you, be aware of where they are.  Review your medicines with your doctor. Some medicines can make you feel dizzy. This can increase your chance of falling. Ask your doctor what other things that you can do to help prevent falls. This information is not intended to replace advice given to you by your health care provider. Make sure you discuss any questions you have with your health care provider. Document Released: 11/11/2008  Document Revised: 06/23/2015 Document Reviewed: 02/19/2014 Elsevier Interactive Patient Education  2017 [employer].

## 2019-08-27 NOTE — Telephone Encounter (Signed)
-----   Message from Sheral Flow, [place] sent at 9/87/2158 11:52 AM EDT ----- Regarding: Refills Good afternoon,  Samantha [name] is requesting refills on the following meds: pregablin 75mg , clopidogrel 75mg  and cetirizine 10mg  to be sent to [facility] on [address].  Thank you,  Mignon Pine

## 2019-08-27 NOTE — Progress Notes (Signed)
I connected with Samantha [name] today by telephone and verified that I am speaking with the correct person using two identifiers. Location patient: home Location provider: work Persons participating in the virtual visit: Samantha [name] and Samantha Abu, LPN   I discussed the limitations, risks, security and privacy concerns of performing an evaluation and management service by telephone and the availability of in person appointments. I also discussed with the patient that there may be a patient responsible charge related to this service. The patient expressed understanding and verbally consented to this telephonic visit.    Interactive audio and video telecommunications were attempted between this provider and patient, however failed, due to patient having technical difficulties OR patient did not have access to video capability.  We continued and completed visit with audio only.  Some vital signs may be absent or patient reported.   Time Spent with patient on telephone encounter: 20 minutes  Subjective:   Samantha [name] is a 79 y.o. female who presents for Medicare Annual (Subsequent) preventive examination.  Review of Systems    No ROS. Medicare Wellness Virtual Visit. Additional risk factors are reflected in social history. Cardiac Risk Factors include: advanced age (>25men, >60 women);diabetes mellitus;dyslipidemia;family history of premature cardiovascular disease;hypertension;obesity (BMI >30kg/m2)     Objective:    There were no vitals filed for this visit. There is no height or weight on file to calculate BMI.  Advanced Directives 08/27/2019 08/21/2018 01/20/2018 01/16/2018 08/13/2017 01/17/2016 11/11/2014  Does Patient Have a Medical Advance Directive? Yes Yes Yes Yes Yes Yes No  Type of Advance Directive Living will [place];Living will Living will - [place];Living will [place];Living will -  Does  patient want to make changes to medical advance directive? No - Patient declined - No - Patient declined - - - -  Copy of [place] in Chart? - No - copy requested - - No - copy requested No - copy requested -  Would patient like information on creating a medical advance directive? - - - - - - -  Pre-existing out of facility DNR order (yellow form or pink MOST form) - - - - - - -    Current Medications (verified) Outpatient Encounter Medications as of 08/27/2019  Medication Sig  . acetaminophen (TYLENOL) 500 MG tablet Take 500 mg by mouth every 6 (six) hours as needed.  [name] albuterol (PROVENTIL HFA;VENTOLIN HFA) 108 (90 Base) MCG/ACT inhaler Inhale 2 puffs into the lungs every 6 (six) hours as needed for wheezing or shortness of breath.  [name] amLODipine (NORVASC) 5 MG tablet   . budesonide-formoterol (SYMBICORT) 80-4.5 MCG/ACT inhaler Inhale 2 puffs into the lungs 2 (two) times daily.  . butalbital-acetaminophen-caffeine (FIORICET) 50-325-40 MG tablet Take 1 tablet by mouth every 6 (six) hours as needed for headache or migraine.  [name] BYSTOLIC 10 MG tablet TAKE 1 TABLET(10 MG) BY MOUTH DAILY  . CALCIUM PO Take 1 tablet by mouth every morning.   . cetirizine (ZYRTEC) 10 MG tablet TAKE 1 TABLET(10 MG) BY MOUTH DAILY  . cholecalciferol (VITAMIN D) 1000 UNITS tablet Take 1,000 Units by mouth every morning.   . cholestyramine (QUESTRAN) 4 g packet Take 1 packet (4 g total) by mouth daily.  . clopidogrel (PLAVIX) 75 MG tablet TAKE 1 TABLET(75 MG) BY MOUTH DAILY  . Evolocumab (REPATHA SURECLICK) 130 MG/ML SOAJ Inject 1 pen into the skin every 14 (fourteen) days.  . hydrocortisone 2.5 % cream  Apply topically 2 (two) times daily.  . hydroxychloroquine (PLAQUENIL) 200 MG tablet hydroxychloroquine 200 mg tablet  . isosorbide mononitrate (IMDUR) 60 MG 24 hr tablet TAKE 1 TABLET(60 MG) BY MOUTH DAILY  . lisinopril (ZESTRIL) 40 MG tablet Take 1 tablet (40 mg total) by mouth daily.  . Magnesium  100 MG CAPS Take by mouth.  . metFORMIN (GLUCOPHAGE-XR) 500 MG 24 hr tablet TAKE 1 TABLET(500 MG) BY MOUTH DAILY WITH BREAKFAST  . Polyethyl Glycol-Propyl Glycol (SYSTANE) 0.4-0.3 % GEL Place 1 application into both eyes daily as needed (for dry eyes).   . Potassium 99 MG TABS Take 99 mg by mouth daily.   . pregabalin (LYRICA) 75 MG capsule TAKE 1 CAPSULE(75 MG) BY MOUTH AT BEDTIME  . torsemide (DEMADEX) 20 MG tablet Take 1 tablet (20 mg total) by mouth daily.   No facility-administered encounter medications on file as of 08/27/2019.    Allergies (verified) Bactrim [sulfamethoxazole-trimethoprim], Cefuroxime axetil, Oxycodone, Pravastatin, Seldane [terfenadine], Zocor [simvastatin], Tramadol, Atorvastatin, Lime flavor [flavoring agent], Rosuvastatin, and Tape   History: Past Medical History:  Diagnosis Date  . A-fib ([place])   . Abnormal CT of the chest 2008   last CT4-l 2009:  . No f/u suggested   . Allergy   . Asthma   . CAD (coronary artery disease)    a. Coronary CTA 10/16: Coronary Ca score 211, mod non-obstructive CAD with LM mild plaque (25-50%), mid LAD 50-69%. b. Neg nuc 06/2015.  . Cataract    BILATERAL-REMOVED  . Chronic diastolic CHF (congestive heart failure) ([place])   . Collagen vascular disease ([place])    "arterial sclerosis" per pt  . Complication of anesthesia    trouble waking up  . Fibromyalgia   . GERD (gastroesophageal reflux disease)   . H/O hiatal hernia   . Heart murmur   . Hyperlipidemia   . Hypertension   . 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 Neuromuscular disorder ([facility])    FIBROMYALGIA  . Ocular migraine   . OSA (obstructive sleep apnea) 09/2007   dx w/ a sleep study, not on  CPAP  . Osteoarthritis   . Osteoporosis   . Pneumonia    "double" in 2004  . PONV (postoperative nausea and vomiting)   . Reactive airway disease 01/29/2002   dx of pseudoasthma / vcd in 2005 and nl sprirometry  History of dyspnea, 2011,  improved after several medications were changed around Question of COPD, disproved July 06, 2009 with nl pft's      . Rheumatoid factor positive   . Shingles 11/2009  . Sleep apnea   . Stroke ([place])   . TIA (transient ischemic attack)    x2 - on Plavix for this  . Torn rotator cuff    right worse than left, both are torn  . Tumor, 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  . Type II diabetes mellitus ([place])   . 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 Vaginal dysplasia    Past Surgical History:  Procedure Laterality Date  . ABDOMINAL HYSTERECTOMY  1980   NO oophorectomy per pt   . ANTERIOR CERVICAL DECOMP/DISCECTOMY FUSION  2001   C 3, C4 and C5 plate and screws  . BREAST BIOPSY Right 1999  . BUNIONECTOMY Left ~ 1977  . CARDIAC CATHETERIZATION     2018 By Dr. Pernell Dupre (done after colon surgery)  . CATARACT EXTRACTION W/  INTRAOCULAR LENS  IMPLANT, BILATERAL  2012  . COLON SURGERY  10/2014  . EYE SURGERY Bilateral    torq lens for cataracts  . LEFT HEART CATH AND CORONARY ANGIOGRAPHY N/A 03/22/2016   Procedure: Left Heart Cath and Coronary Angiography;  Surgeon: Belva Crome, MD;  Location: [facility];  Service: Cardiovascular;  Laterality: N/A;  . LUMBAR LAMINECTOMY/DECOMPRESSION MICRODISCECTOMY N/A 01/20/2018   Procedure: L4-5 decompression;  Surgeon: Marybelle Killings, MD;  Location: [facility];  Service: Orthopedics;  Laterality: N/A;  . THYROIDECTOMY, PARTIAL  1960's  . VAGINAL MASS EXCISION  1994   "Laser surgery for vaginal cancer; followed by chemotherapy" (06/27/2012)   Family History  Problem Relation Age of Onset  . Heart disease Father   . Heart disease Mother   . Lung cancer Mother   . Allergies Sister   . Parkinsonism Sister        possible  . Asthma Sister   . Asthma Paternal Grandmother   . Stroke Paternal Grandmother   . Heart disease Other        paternal grandparents, maternal grandparents,   . Heart disease Brother   . Emphysema  Brother   . Aneurysm Brother        x3  . Kidney failure Brother   . Diabetes Brother   . Diabetes Brother   . Stroke Brother   . Stroke Maternal Grandmother   . Breast cancer Neg Hx   . Colon cancer Neg Hx   . Heart attack Neg Hx   . Esophageal cancer Neg Hx   . Rectal cancer Neg Hx   . Stomach cancer Neg Hx    Social History   Socioeconomic History  . Marital status: Married    Spouse name: Ilona Sorrel  . Number of children: 2  . Years of education: masters  . Highest education level: Not on file  Occupational History  . Occupation: Retired, disable since 2000    Employer: RETIRED  Tobacco Use  . Smoking status: Former Smoker    Packs/day: 0.25    Years: 5.00    Pack years: 1.25    Types: Cigarettes    Quit date: 01/29/1998    Years since quitting: 21.5  . Smokeless tobacco: Never Used  . Tobacco comment: Quit in 2001  Vaping Use  . Vaping Use: Never used  Substance and Sexual Activity  . Alcohol use: No    Alcohol/week: 0.0 standard drinks  . Drug use: No  . Sexual activity: Never  Other Topics Concern  . Not on file  Social History Narrative   On disability since 2000--- also husband has MS   Education. College.   Right handed.   Social Determinants of Health   Financial Resource Strain: Low Risk   . Difficulty of Paying Living Expenses: Not hard at all  Food Insecurity: No Food Insecurity  . Worried About Charity fundraiser in the Last Year: Never true  . Ran Out of Food in the Last Year: Never true  Transportation Needs: No Transportation Needs  . Lack of Transportation (Medical): No  . Lack of Transportation (Non-Medical): No  Physical Activity: Sufficiently Active  . Days of Exercise per Week: 7 days  . Minutes of Exercise per Session: 30 min  Stress: No Stress Concern Present  . Feeling of Stress : Not at all  Social Connections:   . Frequency of Communication with Friends and Family:   . Frequency of Social Gatherings with Friends  and Family:     . Attends Religious Services:   . Active Member of Clubs or Organizations:   . Attends Archivist Meetings:   [name] Marital Status:     Tobacco Counseling Counseling given: Not Answered Comment: Quit in 2001   Clinical Intake:  Pre-visit preparation completed: Yes  Pain : No/denies pain     Nutritional Risks: Nausea/ vomitting/ diarrhea Diabetes: Yes CBG done?: No Did pt. bring in CBG monitor from home?: No  How often do you need to have someone help you when you read instructions, pamphlets, or other written materials from your doctor or pharmacy?: 1 - Never What is the last grade level you completed in school?: Bachelor's Degree in [employer]  Diabetic? yes  Interpreter Needed?: No  Information entered by :: [employer]. Lerae Langham, LPN   Activities of Daily Living In your present state of health, do you have any difficulty performing the following activities: 08/27/2019  Hearing? N  Vision? N  Difficulty concentrating or making decisions? N  Walking or climbing stairs? Y  Dressing or bathing? Y  Doing errands, shopping? N  Preparing Food and eating ? N  Using the Toilet? N  In the past six months, have you accidently leaked urine? N  Do you have problems with loss of bowel control? N  Managing your Medications? N  Managing your Finances? N  Housekeeping or managing your Housekeeping? N  Some recent data might be hidden    Patient Care Team: [name], MD as PCP - General (Internal Medicine) [name], MD as Consulting Physician (Ophthalmology) [name], MD as Consulting Physician (Cardiology) [name], MD as Consulting Physician (Obstetrics and Gynecology) [name], MD as Consulting Physician (General Surgery) Zadie Rhine Clent Demark, MD as Consulting Physician (Ophthalmology) Marybelle Killings, MD as Consulting Physician (Orthopedic Surgery)  Indicate any recent Medical Services you may have received from other than [facility]  providers in the past year (date may be approximate).     Assessment:   This is a routine wellness examination for Anesa.  Hearing/Vision screen No exam data present  Dietary issues and exercise activities discussed: Current Exercise Habits: Home exercise routine, Type of exercise: stretching;calisthenics, Time (Minutes): 30, Frequency (Times/Week): 7, Weekly Exercise (Minutes/Week): 210, Intensity: Mild  Goals    .  lose weight      Start to incorporate weights in my exercise routine and do my back and core exercises. 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     .  Patient Stated      Take time for myself as much as I can. Go to a room and take deep breaths if I cannot get out of the house. When my daughter is home take time to leave the house to clear my head and meditate. Continue to work on getting all of the resources I can to help with respite care for my husband. I will set out 3-4 bottles of water and drink them daily. I will drink glucerna if I do not have time to eat or prepare a meal.      .  Patient Stated (pt-stated)      My goal is to get my knee surgery done, lose some weight and improve my quality of life.      Depression Screen PHQ 2/9 Scores 08/27/2019 08/21/2018 08/13/2017 10/19/2016 10/19/2015 09/17/2014 05/19/2014  PHQ - 2 Score 0 1 2 0 0 0  0  PHQ- 9 Score - 1 6 2  - - -    Fall Risk Fall Risk  08/27/2019 08/21/2018 08/13/2017 10/19/2016 10/19/2015  Falls in the past year? 0 1 No No No  Number falls in past yr: 0 1 - - -  Injury with Fall? 0 1 - - -  Risk for fall due to : Impaired balance/gait;Orthopedic patient Impaired mobility;Impaired balance/gait - - -  Follow up Falls evaluation completed Falls prevention discussed;Education provided - - -    Any stairs in or around the home? No  If so, are there any without handrails? No  Home free of loose throw rugs in walkways, pet beds, electrical cords, etc? Yes    Adequate lighting in your home to reduce risk of falls? Yes   ASSISTIVE DEVICES UTILIZED TO PREVENT FALLS:  Life alert? No  Use of a cane, walker or w/c? Yes  Grab bars in the bathroom? Yes  Shower chair or bench in shower? Yes  Elevated toilet seat or a handicapped toilet? Yes   TIMED UP AND GO:  Was the test performed? No .  Length of time to ambulate 10 feet: 0 sec.   Gait slow and steady with assistive device  Cognitive Function: MMSE - Mini Mental State Exam 08/13/2017 10/19/2016  Orientation to time 5 5  Orientation to Place 5 5  Registration 3 3  Attention/ Calculation 5 5  Recall 1 2  Language- name 2 objects 2 2  Language- repeat 1 1  Language- follow 3 step command 3 3  Language- read & follow direction 1 1  Write a sentence 1 1  Copy design 1 1  Total score 28 29        Immunizations Immunization History  Administered Date(s) Administered  . Fluad Quad(high Dose 65+) 11/05/2018  . Influenza Split 11/30/2010  . Influenza Whole 10/20/2008, 01/02/2010  . Influenza, High Dose Seasonal PF 12/24/2012, 03/17/2015, 10/19/2015, 10/19/2016, 10/23/2017  . Influenza, Seasonal, Injecte, Preservative Fre 01/01/2012  . Influenza,inj,Quad PF,6+ Mos 11/13/2013  . Pneumococcal Conjugate-13 05/19/2014  . Pneumococcal Polysaccharide-23 01/29/2002, 11/23/2008  . Td 01/29/2001  . Tdap 08/14/2011  . Zoster 12/24/2012    TDAP status: Up to date Flu Vaccine status: Up to date Pneumococcal vaccine status: Up to date Covid-19 vaccine status: Completed vaccines  Qualifies for Shingles Vaccine? Yes   Zostavax completed Yes   Shingrix Completed?: No.    Education has been provided regarding the importance of this vaccine. Patient has been advised to call insurance company to determine out of pocket expense if they have not yet received this vaccine. Advised may also receive vaccine at local pharmacy or Health Dept. Verbalized acceptance and understanding.  Screening  Tests Health Maintenance  Topic Date Due  . Hepatitis C Screening  Never done  . COVID-19 Vaccine (1) Never done  . FOOT EXAM  05/05/2019  . INFLUENZA VACCINE  08/30/2019  . HEMOGLOBIN A1C  11/05/2019  . OPHTHALMOLOGY EXAM  07/14/2020  . DEXA SCAN  06/11/2021  . TETANUS/TDAP  08/13/2021  . COLONOSCOPY  04/19/2024  . PNA vac Low Risk Adult  Completed    Health Maintenance  Health Maintenance Due  Topic Date Due  . Hepatitis C Screening  Never done  . COVID-19 Vaccine (1) Never done  . FOOT EXAM  05/05/2019    Colorectal cancer screening: Completed 04/20/2019. Repeat every 5 years Mammogram status: Completed 06/12/2016. Repeat every year Bone Density status: Completed 06/11/2016. Results reflect: Bone density results:  NORMAL. Repeat every 2-3 years.  Lung Cancer Screening: (Low Dose CT Chest recommended if Age 67-80 years, 30 pack-year currently smoking OR have quit w/in 15years.) does not qualify.   Lung Cancer Screening Referral: no  Additional Screening:  Hepatitis C Screening: does qualify; Completed no  Vision Screening: Recommended annual ophthalmology exams for early detection of glaucoma and other disorders of the eye. Is the patient up to date with their annual eye exam?  Yes  Who is the provider or what is the name of the office in which the patient attends annual eye exams? [facility] If pt is not established with a provider, would they like to be referred to a provider to establish care? No .   Dental Screening: Recommended annual dental exams for proper oral hygiene  Community Resource Referral / Chronic Care Management: CRR required this visit?  No   CCM required this visit?  No      Plan:     I have personally reviewed and noted the following in the patient's chart:   . Medical and social history . Use of alcohol, tobacco or illicit drugs  . Current medications and supplements . Functional ability and status . Nutritional status . Physical  activity . Advanced directives . List of other physicians . Hospitalizations, surgeries, and ER visits in previous 12 months . Vitals . Screenings to include cognitive, depression, and falls . Referrals and appointments  In addition, I have reviewed and discussed with patient certain preventive protocols, quality metrics, and best practice recommendations. A written personalized care plan for preventive services as well as general preventive health recommendations were provided to patient.     Sheral Flow, LPN   [id number]   Nurse Notes: Patient is cogitatively intact. There were no vitals filed for this visit. There is no height or weight on file to calculate BMI.

## 2019-09-21 DIAGNOSIS — E119 Type 2 diabetes mellitus without complications: Secondary | ICD-10-CM | POA: Diagnosis not present

## 2019-09-21 DIAGNOSIS — Z79899 Other long term (current) drug therapy: Secondary | ICD-10-CM | POA: Diagnosis not present

## 2019-09-21 DIAGNOSIS — M1990 Unspecified osteoarthritis, unspecified site: Secondary | ICD-10-CM | POA: Diagnosis not present

## 2019-09-21 DIAGNOSIS — I10 Essential (primary) hypertension: Secondary | ICD-10-CM | POA: Diagnosis not present

## 2019-09-21 DIAGNOSIS — E785 Hyperlipidemia, unspecified: Secondary | ICD-10-CM | POA: Diagnosis not present

## 2019-09-21 DIAGNOSIS — R635 Abnormal weight gain: Secondary | ICD-10-CM | POA: Diagnosis not present

## 2019-09-21 DIAGNOSIS — M359 Systemic involvement of connective tissue, unspecified: Secondary | ICD-10-CM | POA: Diagnosis not present

## 2019-09-21 DIAGNOSIS — I7300 Raynaud's syndrome without gangrene: Secondary | ICD-10-CM | POA: Diagnosis not present

## 2019-09-21 DIAGNOSIS — Z8739 Personal history of other diseases of the musculoskeletal system and connective tissue: Secondary | ICD-10-CM | POA: Diagnosis not present

## 2019-09-21 DIAGNOSIS — M069 Rheumatoid arthritis, unspecified: Secondary | ICD-10-CM | POA: Diagnosis not present

## 2019-09-21 DIAGNOSIS — M797 Fibromyalgia: Secondary | ICD-10-CM | POA: Diagnosis not present

## 2019-09-28 ENCOUNTER — Other Ambulatory Visit: Payer: Self-pay | Admitting: Cardiology

## 2019-09-28 DIAGNOSIS — I5032 Chronic diastolic (congestive) heart failure: Secondary | ICD-10-CM

## 2019-09-28 DIAGNOSIS — I2510 Atherosclerotic heart disease of native coronary artery without angina pectoris: Secondary | ICD-10-CM

## 2019-09-28 DIAGNOSIS — Z79899 Other long term (current) drug therapy: Secondary | ICD-10-CM

## 2019-10-08 ENCOUNTER — Other Ambulatory Visit: Payer: Self-pay | Admitting: Cardiology

## 2019-10-08 ENCOUNTER — Ambulatory Visit: Payer: Medicare Other | Admitting: Cardiology

## 2019-10-16 ENCOUNTER — Telehealth: Payer: Self-pay | Admitting: Internal Medicine

## 2019-10-16 ENCOUNTER — Other Ambulatory Visit: Payer: Medicare Other

## 2019-10-16 ENCOUNTER — Other Ambulatory Visit: Payer: Self-pay

## 2019-10-16 ENCOUNTER — Telehealth: Payer: Self-pay | Admitting: Cardiology

## 2019-10-16 DIAGNOSIS — R350 Frequency of micturition: Secondary | ICD-10-CM

## 2019-10-16 NOTE — Telephone Encounter (Signed)
Pt c/o medication issue:  1. Name of Medication: lisinopril (ZESTRIL) 40 MG tablet amLODipine (NORVASC) 5 MG tablet 2. How are you currently taking this medication (dosage and times per day)? Hasn't taken for about 3 weeks   3. Are you having a reaction (difficulty breathing--STAT)? No   4. What is your medication issue? Samantha [name] is calling stating her rheumatologist took her off of both of these medications due to excessive swelling and wheezing from fluid build up. She states since being off of this for about three weeks she been having elevated BP ranging from 154-160/86-96. Samantha [name] also states she believes she has become dehydrated and may have a UTI. Please advise.

## 2019-10-16 NOTE — Telephone Encounter (Signed)
    Patient requesting order for urine test to determine if she has a UTI.  Patient has frequent urination

## 2019-10-16 NOTE — Telephone Encounter (Signed)
Notified pt w/MD response.../lmb 

## 2019-10-16 NOTE — Telephone Encounter (Signed)
I spoke with patient.  Her swelling in legs worsened recently and she developed shortness of breath and a dry cough.  She saw her rheumatologist and amlodipine and lisinopril were stopped. Shortness of breath and cough improved.  Swelling improved a little but is still present. She is also on lyrica for fibromyalgia and she is wondering if this may be contributing to swelling also. She has a slight amount of swelling in the morning.  It worsens as the day goes on and when she is on her feet.  She is wearing support hose.  Watching salt intake.  I advised her to try and keep legs elevated when possible.  She weighs about twice a week.  Weight was up 5-6 lbs when she saw rheumatologist but is now back to normal since swelling decreased some  I asked her to weigh herself every morning and keep record.  She will call if weight gain greater than 3 lbs in a day or 5 lbs in a week. Her blood pressure has been elevated recently. She reports the following readings Today-160/86 9/16-160/86 9/15-123/88 9/14-141/91 9/13- 111/67 Readings are before taking AM medications.  I asked her to start checking about 2 hours after taking AM medications and keep record of readings. Patient reports urinary frequency and some burning.  Urine is not dark.  She has been having some left lower back pain which she had in the past when she had kidney issues.  She will contact PCP to discuss this pain and UTI symptoms. Will forward to Dr Meda Coffee for recommendations regarding swelling and BP

## 2019-10-16 NOTE — Telephone Encounter (Signed)
Urine ordered for the [facility] lab

## 2019-10-18 LAB — URINALYSIS, ROUTINE W REFLEX MICROSCOPIC
Bilirubin Urine: NEGATIVE
Glucose, UA: NEGATIVE
Hyaline Cast: NONE SEEN /LPF
Ketones, ur: NEGATIVE
Nitrite: NEGATIVE
Protein, ur: NEGATIVE
RBC / HPF: NONE SEEN /HPF (ref 0–2)
Specific Gravity, Urine: 1.016 (ref 1.001–1.03)
Squamous Epithelial / HPF: NONE SEEN /HPF (ref <=5)
pH: 5.5 (ref 5.0–8.0)

## 2019-10-18 LAB — URINE CULTURE

## 2019-10-19 ENCOUNTER — Telehealth: Payer: Self-pay | Admitting: Internal Medicine

## 2019-10-19 MED ORDER — AMOXICILLIN-POT CLAVULANATE 875-125 MG PO TABS: 1.0000 | ORAL_TABLET | Freq: Two times a day (BID) | ORAL | 0 refills | Status: DC

## 2019-10-19 MED ORDER — LOSARTAN POTASSIUM 50 MG PO TABS: 50.0000 mg | ORAL_TABLET | Freq: Every day | ORAL | 2 refills | Status: DC

## 2019-10-19 NOTE — Telephone Encounter (Signed)
Urine culture does show an infection -- augmentin sent to pharmacy.

## 2019-10-19 NOTE — Telephone Encounter (Signed)
Spoke with the pt and informed her that [name] would like for her to start taking losartan 50 mg po daily.  Confirmed the pharmacy of choice with the pt. Pt verbalized understanding and agrees with this plan.

## 2019-10-19 NOTE — Telephone Encounter (Signed)
Notified pt w/MD response.../lmb 

## 2019-10-19 NOTE — Telephone Encounter (Signed)
Please start her on losartan 50 mg po daily, thank you

## 2019-10-21 DIAGNOSIS — Z01419 Encounter for gynecological examination (general) (routine) without abnormal findings: Secondary | ICD-10-CM | POA: Diagnosis not present

## 2019-10-21 DIAGNOSIS — K649 Unspecified hemorrhoids: Secondary | ICD-10-CM | POA: Diagnosis not present

## 2019-10-21 DIAGNOSIS — Z6836 Body mass index (BMI) 36.0-36.9, adult: Secondary | ICD-10-CM | POA: Diagnosis not present

## 2019-10-21 DIAGNOSIS — Z1231 Encounter for screening mammogram for malignant neoplasm of breast: Secondary | ICD-10-CM | POA: Diagnosis not present

## 2019-10-23 ENCOUNTER — Ambulatory Visit (INDEPENDENT_AMBULATORY_CARE_PROVIDER_SITE_OTHER): Payer: Medicare Other | Admitting: Orthopaedic Surgery

## 2019-10-23 ENCOUNTER — Ambulatory Visit: Payer: Self-pay

## 2019-10-23 ENCOUNTER — Encounter: Payer: Self-pay | Admitting: Orthopaedic Surgery

## 2019-10-23 VITALS — BP 126/82 | HR 73 | Ht 62.0 in | Wt 197.0 lb

## 2019-10-23 DIAGNOSIS — I2510 Atherosclerotic heart disease of native coronary artery without angina pectoris: Secondary | ICD-10-CM | POA: Diagnosis not present

## 2019-10-23 DIAGNOSIS — I2583 Coronary atherosclerosis due to lipid rich plaque: Secondary | ICD-10-CM

## 2019-10-23 DIAGNOSIS — M1711 Unilateral primary osteoarthritis, right knee: Secondary | ICD-10-CM | POA: Diagnosis not present

## 2019-10-23 NOTE — Progress Notes (Signed)
Office Visit Note   Patient: Samantha [name]           Date of Birth: 19-Mar-1940           MRN: [id number] Visit Date: 10/23/2019              Requested by: [name], MD [place],  [place] [id number] PCP: [name], MD   Assessment & Plan: Visit Diagnoses:  1. Unilateral primary osteoarthritis, right knee     Plan: Patient has severe right knee osteoarthritis progressive valgus.  She has been treated with anti-inflammatories, injections, Tylenol without relief.  She states she is ready to proceed with right total knee arthroplasty.  Her surgical treatment has been delayed which took care of her husband until he passed away who had bilateral amputations as well as an MS.  Procedure discussed x-rays were reviewed.  She understands she can stay overnight.  She make arrangements for family help with her postop care we discussed home health physical therapy, outpatient therapy.  Opposite left knee has mild to moderate osteoarthritis but not as severe as the right knee.  Procedure discussed questions were elicited and answered she understands request to proceed.  She will need preoperative clearance with her medical history.  Follow-Up Instructions: No follow-ups on file.   Orders:  Orders Placed This Encounter  Procedures  . XR KNEE 3 VIEW RIGHT   No orders of the defined types were placed in this encounter.     Procedures: No procedures performed   Clinical Data: No additional findings.   Subjective: Chief Complaint  Patient presents with  . Right Knee - Pain  . Left Knee - Pain    HPI 79 year old female with progressive right knee pain and valgus deformity greater than 15 degrees.  Patient states her husband passed away in Jun 30, 2022 she had been taking care of him and had delayed total knee arthroplasty while she help with his care.  She has been taking prednisone and states now that it stops it had significant increase in her pain.  Recent amlodipine  and lisinopril were stopped she is now taking losartan.  She is used Tylenol for her knee pain.  She has difficulty walking problems with stairs problems getting from sitting to standing position.  Review of Systems positive for type 2 diabetes history of heart failure class II, hypertension GERD, shoulder arthritis.  Progressive knee arthritis in the right with progressive valgus.  Colon cancer 2016.[name] Previous L4-5 decompression 2019 doing well.  Objective: Vital Signs: BP 126/82   Pulse 73   Ht 5\' 2"  (1.575 m)   Wt 197 lb (89.4 kg)   BMI 36.03 kg/m   Physical Exam Constitutional:      Appearance: She is well-developed.  HENT:     Head: Normocephalic.     Right Ear: External ear normal.     Left Ear: External ear normal.  Eyes:     Pupils: Pupils are equal, round, and reactive to light.  Neck:     Thyroid: No thyromegaly.     Trachea: No tracheal deviation.  Cardiovascular:     Rate and Rhythm: Normal rate.  Pulmonary:     Effort: Pulmonary effort is normal.  Abdominal:     Palpations: Abdomen is soft.  Skin:    General: Skin is warm and dry.  Neurological:     Mental Status: She is alert and oriented to person, place, and time.  Psychiatric:  Behavior: Behavior normal.     Ortho Exam patient has 2+ knee effusion valgus of the right knee severe crepitus ambulatory with the valgus of the right knee difficulty getting from sitting to standing.  Specialty Comments:  No specialty comments available.  Imaging: No results found.   PMFS History: Patient Active Problem List   Diagnosis Date Noted  . Posterior vitreous detachment of right eye 07/15/2019  . Posterior vitreous detachment of left eye 07/15/2019  . 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 Diabetes mellitus without complication ([place]) 31/54/0086  . 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 Raynaud phenomenon 05/06/2019  . Trigger finger, left ring finger 03/18/2019  . Unilateral primary  osteoarthritis, right knee 01/28/2018  . 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 01/28/2018  . Sacral back pain 10/16/2017  . Chronic left upper quadrant pain 07/23/2017  . Rectal bleeding 07/23/2017  . Poor balance 03/14/2017  . Right shoulder pain 03/14/2017  . Angina pectoris ([place]) 03/22/2016  . Hypertensive heart disease 07/07/2015  . Essential hypertension 07/07/2015  . Plantar fasciitis, left 03/22/2015  . Coronary artery disease due to lipid rich plaque 01/05/2015  . Chronic diastolic CHF (congestive heart failure), NYHA class 2 ([place]) 01/05/2015  . 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 Migraine (Ocular) 01/05/2014  . VBI (vertebrobasilar insufficiency) 08/22/2012  . TIA (transient ischemic attack) 06/27/2012  . DJD (degenerative joint disease) 02/02/2011  . 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 Postherpetic neuralgia ? 01/02/2010  . Palpitations 11/23/2008  . UTI'S, RECURRENT 09/28/2008  . Fibromyalgia 08/15/2007  . 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 Hyperlipidemia 05/21/2006  . Reactive airway disease 01/29/2002   Past Medical History:  Diagnosis Date  . A-fib ([place])   . Abnormal CT of the chest 2008   last CT4-l 2009:  . No f/u suggested   . Allergy   . Asthma   . CAD (coronary artery disease)    a. Coronary CTA 10/16: Coronary Ca score 211, mod non-obstructive CAD with LM mild plaque (25-50%), mid LAD 50-69%. b. Neg nuc 06/2015.  . Cataract    BILATERAL-REMOVED  . Chronic diastolic CHF (congestive heart failure) ([place])   . Collagen vascular disease ([place])    "arterial sclerosis" per pt  . Complication of anesthesia    trouble waking up  . Fibromyalgia   . GERD (gastroesophageal reflux disease)   . H/O hiatal hernia   . Heart murmur   . Hyperlipidemia   . Hypertension   . Malignant neoplasm of ascending colon ([place]) 2016   Minimally  invasive right hemicolectomy to be done   . Neuromuscular disorder ([facility])    FIBROMYALGIA  . Ocular migraine   . OSA (obstructive sleep apnea) 09/2007   dx w/ a sleep study, not on  CPAP  . Osteoarthritis   . Osteoporosis   . Pneumonia    "double" in 2004  . PONV (postoperative nausea and vomiting)   . Reactive airway disease 01/29/2002   dx of pseudoasthma / vcd in 2005 and nl sprirometry History of dyspnea, 2011,  improved after several medications were changed around Question of COPD, disproved July 06, 2009 with nl pft's      . Rheumatoid factor positive   . Shingles 11/2009  . Sleep apnea   . Stroke ([place])   . TIA (transient ischemic attack)    x2 - on Plavix for this  .  Torn rotator cuff    right worse than left, both are torn  . Tumor, 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  . Type II diabetes mellitus ([place])   . 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 Vaginal dysplasia     Family History  Problem Relation Age of Onset  . Heart disease Father   . Heart disease Mother   . Lung cancer Mother   . Allergies Sister   . Parkinsonism Sister        possible  . Asthma Sister   . Asthma Paternal Grandmother   . Stroke Paternal Grandmother   . Heart disease Other        paternal grandparents, maternal grandparents,   . Heart disease Brother   . Emphysema Brother   . Aneurysm Brother        x3  . Kidney failure Brother   . Diabetes Brother   . Diabetes Brother   . Stroke Brother   . Stroke Maternal Grandmother   . Breast cancer Neg Hx   . Colon cancer Neg Hx   . Heart attack Neg Hx   . Esophageal cancer Neg Hx   . Rectal cancer Neg Hx   . Stomach cancer Neg Hx     Past Surgical History:  Procedure Laterality Date  . ABDOMINAL HYSTERECTOMY  1980   NO oophorectomy per pt   . ANTERIOR CERVICAL DECOMP/DISCECTOMY FUSION  2001   C 3, C4 and C5 plate and screws  . BREAST BIOPSY Right 1999  . BUNIONECTOMY Left ~ 1977  . CARDIAC CATHETERIZATION     2018 By Dr. Pernell Dupre (done  after colon surgery)  . CATARACT EXTRACTION W/ INTRAOCULAR LENS  IMPLANT, BILATERAL  2012  . COLON SURGERY  10/2014  . EYE SURGERY Bilateral    torq lens for cataracts  . LEFT HEART CATH AND CORONARY ANGIOGRAPHY N/A 03/22/2016   Procedure: Left Heart Cath and Coronary Angiography;  Surgeon: Belva Crome, MD;  Location: [facility];  Service: Cardiovascular;  Laterality: N/A;  . LUMBAR LAMINECTOMY/DECOMPRESSION MICRODISCECTOMY N/A 01/20/2018   Procedure: L4-5 decompression;  Surgeon: Marybelle Killings, MD;  Location: [facility];  Service: Orthopedics;  Laterality: N/A;  . THYROIDECTOMY, PARTIAL  1960's  . VAGINAL MASS EXCISION  1994   "Laser surgery for vaginal cancer; followed by chemotherapy" (06/27/2012)   Social History   Occupational History  . Occupation: Retired, disable since 2000    Employer: RETIRED  Tobacco Use  . Smoking status: Former Smoker    Packs/day: 0.25    Years: 5.00    Pack years: 1.25    Types: Cigarettes    Quit date: 01/29/1998    Years since quitting: 21.7  . Smokeless tobacco: Never Used  . Tobacco comment: Quit in 2001  Vaping Use  . Vaping Use: Never used  Substance and Sexual Activity  . Alcohol use: No    Alcohol/week: 0.0 standard drinks  . Drug use: No  . Sexual activity: Never

## 2019-10-25 ENCOUNTER — Other Ambulatory Visit: Payer: Self-pay | Admitting: Internal Medicine

## 2019-11-03 DIAGNOSIS — I700 Atherosclerosis of aorta: Secondary | ICD-10-CM | POA: Insufficient documentation

## 2019-11-03 NOTE — Assessment & Plan Note (Addendum)
Chronic On repatha due to intolerance to statins Check lipids, cmp Encouraged regular exercise and healthy diet

## 2019-11-03 NOTE — Patient Instructions (Addendum)
  Blood work was ordered.   ° ° °Flu immunization administered today.   ° ° °Medications reviewed and updated.  Changes include :   none ° °Your prescription(s) have been submitted to your pharmacy. Please take as directed and contact our office if you believe you are having problem(s) with the medication(s). ° ° °Please followup in 6 months ° ° °

## 2019-11-03 NOTE — Progress Notes (Signed)
Subjective:    Patient ID: Samantha [name], female    DOB: 1940/04/26, 79 y.o.   MRN: [id number]  HPI The patient is here for follow up of their chronic medical problems, including dm with neuropathy, htn, hyperlipidemia, fibromyalgia, aortic atherosclerosis  She is taking all of her medications as prescribed.    She is having R knee replacement on 12/04/2019 by Dr Lorin Mercy.  She will have general and spinal anesthesia.  She is a preop clearance form.  She has been having right neck pain that radiates up to top of her head .  She often feels it when she turns her head to the left.  She has neck pain - rubbing or bone pain.  She denies muscle tightness. She had cervical fusion surgery last year.  The pain is intermittent throughout the day.  She did mention this to her orthopedic, but has not been evaluated further at this time.  Medications and allergies reviewed with patient and updated if appropriate.  Patient Active Problem List   Diagnosis Date Noted   Aortic atherosclerosis ([place]) 11/03/2019   Posterior vitreous detachment of right eye 07/15/2019   Posterior vitreous detachment of left eye 07/15/2019   Ophthalmic migraine 07/15/2019   Diabetes mellitus without complication ([place]) 83/41/9622   Recurrent corneal erosion, 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   Raynaud phenomenon 05/06/2019   Trigger finger, left ring finger 03/18/2019   Unilateral primary osteoarthritis, right knee 01/28/2018   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 01/28/2018   Sacral back pain 10/16/2017   Chronic left upper quadrant pain 07/23/2017   Rectal bleeding 07/23/2017   Poor balance 03/14/2017   Right shoulder pain 03/14/2017   Angina pectoris ([place]) 03/22/2016   Hypertensive heart disease 07/07/2015   Essential hypertension 07/07/2015   Plantar fasciitis, left 03/22/2015   Coronary artery disease due to lipid rich plaque 01/05/2015   Chronic diastolic CHF  (congestive heart failure), NYHA class 2 ([place]) 01/05/2015   Malignant neoplasm of ascending colon  pT1, pN0, 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 01/02/2010   Palpitations 11/23/2008   UTI'S, RECURRENT 09/28/2008   Fibromyalgia 08/15/2007   DM II (diabetes mellitus, type II), w/ neuropathy 05/21/2006   Hyperlipidemia 05/21/2006   Reactive airway disease 01/29/2002    Current Outpatient Medications on File Prior to Visit  Medication Sig Dispense Refill   acetaminophen (TYLENOL) 500 MG tablet Take 500 mg by mouth every 6 (six) hours as needed.     albuterol (PROVENTIL HFA;VENTOLIN HFA) 108 (90 Base) MCG/ACT inhaler Inhale 2 puffs into the lungs every 6 (six) hours as needed for wheezing or shortness of breath. 18 g 3   budesonide-formoterol (SYMBICORT) 80-4.5 MCG/ACT inhaler Inhale 2 puffs into the lungs 2 (two) times daily. 1 Inhaler 3   butalbital-acetaminophen-caffeine (FIORICET) 50-325-40 MG tablet Take 1 tablet by mouth every 6 (six) hours as needed for headache or migraine. 30 tablet 5   BYSTOLIC 10 MG tablet TAKE 1 TABLET(10 MG) BY MOUTH DAILY 90 tablet 2   CALCIUM PO Take 1 tablet by mouth every morning.      cetirizine (ZYRTEC) 10 MG tablet TAKE 1 TABLET(10 MG) BY MOUTH DAILY 90 tablet 3   cholecalciferol (VITAMIN D) 1000 UNITS tablet Take 1,000 Units by  mouth every morning.      cholestyramine (QUESTRAN) 4 g packet Take 1 packet (4 g total) by mouth daily. 30 packet 11   clopidogrel (PLAVIX) 75 MG tablet TAKE 1 TABLET(75 MG) BY MOUTH DAILY 90 tablet 1   Evolocumab (REPATHA SURECLICK) 938 MG/ML SOAJ Inject 1 pen into the skin every 14 (fourteen) days. 2 pen 11   furosemide (LASIX) 20 MG tablet       hydrochlorothiazide (HYDRODIURIL) 25 MG tablet      hydrocortisone 2.5 % cream Apply topically 2 (two) times daily. 30 g 0   hydroxychloroquine (PLAQUENIL) 200 MG tablet hydroxychloroquine 200 mg tablet     isosorbide mononitrate (IMDUR) 60 MG 24 hr tablet TAKE 1 TABLET(60 MG) BY MOUTH DAILY 90 tablet 3   losartan (COZAAR) 50 MG tablet Take 1 tablet (50 mg total) by mouth daily. 90 tablet 2   Magnesium 100 MG CAPS Take by mouth.     metFORMIN (GLUCOPHAGE-XR) 500 MG 24 hr tablet TAKE 1 TABLET(500 MG) BY MOUTH DAILY WITH BREAKFAST 90 tablet 1   Polyethyl Glycol-Propyl Glycol (SYSTANE) 0.4-0.3 % GEL Place 1 application into both eyes daily as needed (for dry eyes).      Potassium 99 MG TABS Take 99 mg by mouth daily.      pramoxine-hydrocortisone cream hydrocortisone-pramoxine 2.5 %-1 % topical cream  INSERT RECTALLY TO THE AFFECTED AREA TWICE DAILY AS NEEDED     pregabalin (LYRICA) 75 MG capsule TAKE 1 CAPSULE(75 MG) BY MOUTH AT BEDTIME 30 capsule 0   torsemide (DEMADEX) 20 MG tablet TAKE 1 TABLET(20 MG) BY MOUTH DAILY 90 tablet 3   No current facility-administered medications on file prior to visit.    Past Medical History:  Diagnosis Date   A-fib [facility])    Abnormal CT of the chest 2008   last CT4-l 2009:  . No f/u suggested    Allergy    Asthma    CAD (coronary artery disease)    a. Coronary CTA 10/16: Coronary Ca score 211, mod non-obstructive CAD with LM mild plaque (25-50%), mid LAD 50-69%. b. Neg nuc 06/2015.   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[place]) 2016   Minimally invasive right hemicolectomy to be done     Neuromuscular disorder ([place])    FIBROMYALGIA   Ocular migraine    OSA (obstructive sleep apnea) 09/2007   dx w/ a sleep study, not on  CPAP   Osteoarthritis    Osteoporosis    Pneumonia    "double" in 2004   PONV (postoperative nausea and vomiting)    Reactive airway disease 01/29/2002   dx of pseudoasthma / vcd in 2005 and nl sprirometry History of dyspnea, 2011,  improved after several medications were changed around Question of COPD, disproved July 06, 2009 with nl pft's       Rheumatoid factor positive    Shingles 11/2009   Sleep apnea    Stroke ([place])    TIA (transient ischemic attack)    x2 - on Plavix for this   Torn rotator cuff  right worse than left, both are torn   Tumor, 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   Type II diabetes mellitus ([place])    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 Pernell Dupre (done after colon surgery)   CATARACT EXTRACTION W/ INTRAOCULAR LENS  IMPLANT, BILATERAL  2012   COLON SURGERY  10/2014   EYE SURGERY Bilateral    torq lens for cataracts   LEFT HEART CATH AND CORONARY ANGIOGRAPHY N/A 03/22/2016   Procedure: Left Heart Cath and Coronary Angiography;  Surgeon: Belva Crome, MD;  Location: [facility];  Service: Cardiovascular;  Laterality: N/A;   LUMBAR LAMINECTOMY/DECOMPRESSION MICRODISCECTOMY N/A 01/20/2018   Procedure: L4-5 decompression;  Surgeon: Marybelle Killings, MD;  Location: [facility];  Service: Orthopedics;  Laterality: N/A;   THYROIDECTOMY, PARTIAL  1960's   VAGINAL MASS EXCISION  1994   "Laser surgery for vaginal cancer; followed by chemotherapy" (06/27/2012)    Social History   Socioeconomic  History   Marital status: Married    Spouse name: Ilona Sorrel   Number of children: 2   Years of education: masters   Highest education level: Not on file  Occupational History   Occupation: Retired, disable since 2000    Employer: RETIRED  Tobacco Use   Smoking status: Former Smoker    Packs/day: 0.25    Years: 5.00    Pack years: 1.25    Types: Cigarettes    Quit date: 01/29/1998    Years since quitting: 21.7   Smokeless tobacco: Never Used   Tobacco comment: Quit in 2001  Vaping Use   Vaping Use: Never used  Substance and Sexual Activity   Alcohol use: No    Alcohol/week: 0.0 standard drinks   Drug use: No   Sexual activity: Never  Other Topics Concern   Not on file  Social History Narrative   On disability since 2000--- also husband has MS   Education. College.   Right handed.   Social Determinants of Health   Financial Resource Strain: Low Risk    Difficulty of Paying Living Expenses: Not hard at all  Food Insecurity: No Food Insecurity   Worried About Charity fundraiser in the Last Year: Never true   [place] in the Last Year: Never true  Transportation Needs: No Transportation Needs   Lack of Transportation (Medical): No   Lack of Transportation (Non-Medical): No  Physical Activity: Sufficiently Active   Days of Exercise per Week: 7 days   Minutes of Exercise per Session: 30 min  Stress: No Stress Concern Present   Feeling of Stress : Not at all  Social Connections:    Frequency of Communication with Friends and Family: Not on file   Frequency of Social Gatherings with Friends and Family: Not on file   Attends Religious Services: Not on file   Active Member of Clubs or Organizations: Not on file   Attends Archivist Meetings: Not on file   Marital Status: Not on file    Family History  Problem Relation Age of Onset   Heart  disease Father    Heart disease Mother    Lung cancer Mother    Allergies Sister      Parkinsonism Sister        possible   Asthma Sister    Asthma Paternal Grandmother    Stroke Paternal Grandmother    Heart disease Other        paternal grandparents, maternal grandparents,    Heart disease Brother    Emphysema Brother    Aneurysm Brother        x3   Kidney failure Brother    Diabetes Brother    Diabetes Brother    Stroke Brother    Stroke Maternal Grandmother    Breast cancer Neg Hx    Colon cancer Neg Hx    Heart attack Neg Hx    Esophageal cancer Neg Hx    Rectal cancer Neg Hx    Stomach cancer Neg Hx     Review of Systems  Constitutional: Negative for appetite change, chills and fever.  Respiratory: Positive for wheezing. Negative for cough and shortness of breath.   Cardiovascular: Positive for palpitations (a little - chronic, no change - wears support hose, improves over night) and leg swelling. Negative for chest pain.  Neurological: Positive for light-headedness (occ - from losartan - only when she first takes) and headaches (right neck to top of head pain, some posterior left headaches).  Psychiatric/Behavioral: Negative for sleep disturbance (sleep good with lyrica).       Objective:   Vitals:   11/04/19 1018  BP: 120/68  Pulse: 62  Temp: 98 F (36.7 C)  SpO2: 99%   BP Readings from Last 3 Encounters:  11/04/19 120/68  10/23/19 126/82  07/03/19 130/74   Wt Readings from Last 3 Encounters:  11/04/19 197 lb (89.4 kg)  10/23/19 197 lb (89.4 kg)  07/03/19 195 lb (88.5 kg)   Body mass index is 36.03 kg/m.   Physical Exam    Constitutional: Appears well-developed and well-nourished. No distress.  HENT:  Head: Normocephalic and atraumatic.  Neck: Neck supple. No tracheal deviation present. No thyromegaly present.  No cervical lymphadenopathy Cardiovascular: Normal rate, regular rhythm and normal heart sounds.  2/6 sys murmur heard. No carotid bruit .  No edema Pulmonary/Chest: Effort normal and breath sounds  normal. No respiratory distress. No has no wheezes. No rales.  Skin: Skin is warm and dry. Not diaphoretic.  Psychiatric: Normal mood and affect. Behavior is normal.      Assessment & Plan:    See Problem List for Assessment and Plan of chronic medical problems.    This visit occurred during the SARS-CoV-2 public health emergency.  Safety protocols were in place, including screening questions prior to the visit, additional usage of staff PPE, and extensive cleaning of exam room while observing appropriate contact time as indicated for disinfecting solutions.

## 2019-11-04 ENCOUNTER — Ambulatory Visit (INDEPENDENT_AMBULATORY_CARE_PROVIDER_SITE_OTHER): Payer: Medicare Other | Admitting: Internal Medicine

## 2019-11-04 ENCOUNTER — Other Ambulatory Visit: Payer: Self-pay

## 2019-11-04 ENCOUNTER — Encounter: Payer: Self-pay | Admitting: Internal Medicine

## 2019-11-04 VITALS — BP 120/68 | HR 62 | Temp 98.0°F | Ht 62.0 in | Wt 197.0 lb

## 2019-11-04 DIAGNOSIS — I10 Essential (primary) hypertension: Secondary | ICD-10-CM

## 2019-11-04 DIAGNOSIS — E11311 Type 2 diabetes mellitus with unspecified diabetic retinopathy with macular edema: Secondary | ICD-10-CM

## 2019-11-04 DIAGNOSIS — M797 Fibromyalgia: Secondary | ICD-10-CM | POA: Diagnosis not present

## 2019-11-04 DIAGNOSIS — I700 Atherosclerosis of aorta: Secondary | ICD-10-CM

## 2019-11-04 DIAGNOSIS — E782 Mixed hyperlipidemia: Secondary | ICD-10-CM | POA: Diagnosis not present

## 2019-11-04 DIAGNOSIS — Z23 Encounter for immunization: Secondary | ICD-10-CM | POA: Diagnosis not present

## 2019-11-04 DIAGNOSIS — Z1159 Encounter for screening for other viral diseases: Secondary | ICD-10-CM | POA: Diagnosis not present

## 2019-11-04 DIAGNOSIS — I2510 Atherosclerotic heart disease of native coronary artery without angina pectoris: Secondary | ICD-10-CM

## 2019-11-04 DIAGNOSIS — I2583 Coronary atherosclerosis due to lipid rich plaque: Secondary | ICD-10-CM | POA: Diagnosis not present

## 2019-11-04 DIAGNOSIS — J4530 Mild persistent asthma, uncomplicated: Secondary | ICD-10-CM | POA: Diagnosis not present

## 2019-11-04 DIAGNOSIS — Z794 Long term (current) use of insulin: Secondary | ICD-10-CM

## 2019-11-04 LAB — COMPREHENSIVE METABOLIC PANEL
ALT: 14 U/L (ref 0–35)
AST: 16 U/L (ref 0–37)
Albumin: 4.4 g/dL (ref 3.5–5.2)
Alkaline Phosphatase: 43 U/L (ref 39–117)
BUN: 23 mg/dL (ref 6–23)
CO2: 29 mEq/L (ref 19–32)
Calcium: 9.1 mg/dL (ref 8.4–10.5)
Chloride: 104 mEq/L (ref 96–112)
Creatinine, Ser: 1.12 mg/dL (ref 0.40–1.20)
GFR: 47.18 mL/min — ABNORMAL LOW (ref >60.00)
Glucose, Bld: 102 mg/dL — ABNORMAL HIGH (ref 70–99)
Potassium: 4 mEq/L (ref 3.5–5.1)
Sodium: 139 mEq/L (ref 135–145)
Total Bilirubin: 0.6 mg/dL (ref 0.2–1.2)
Total Protein: 7.2 g/dL (ref 6.0–8.3)

## 2019-11-04 LAB — CBC WITH DIFFERENTIAL/PLATELET
Basophils Absolute: 0 10*3/uL (ref 0.0–0.1)
Basophils Relative: 0.4 % (ref 0.0–3.0)
Eosinophils Absolute: 0.2 10*3/uL (ref 0.0–0.7)
Eosinophils Relative: 5.2 % — ABNORMAL HIGH (ref 0.0–5.0)
HCT: 37.4 % (ref 36.0–46.0)
Hemoglobin: 12.6 g/dL (ref 12.0–15.0)
Lymphocytes Relative: 42.7 % (ref 12.0–46.0)
Lymphs Abs: 1.5 10*3/uL (ref 0.7–4.0)
MCHC: 33.7 g/dL (ref 30.0–36.0)
MCV: 85.4 fl (ref 78.0–100.0)
Monocytes Absolute: 0.3 10*3/uL (ref 0.1–1.0)
Monocytes Relative: 9.4 % (ref 3.0–12.0)
Neutro Abs: 1.4 10*3/uL (ref 1.4–7.7)
Neutrophils Relative %: 42.3 % — ABNORMAL LOW (ref 43.0–77.0)
Platelets: 133 10*3/uL — ABNORMAL LOW (ref 150.0–400.0)
RBC: 4.38 Mil/uL (ref 3.87–5.11)
RDW: 15.1 % (ref 11.5–15.5)
WBC: 3.4 10*3/uL — ABNORMAL LOW (ref 4.0–10.5)

## 2019-11-04 LAB — LIPID PANEL
Cholesterol: 140 mg/dL (ref 0–200)
HDL: 81.2 mg/dL (ref >39.00)
LDL Cholesterol: 35 mg/dL (ref 0–99)
NonHDL: 58.49
Total CHOL/HDL Ratio: 2
Triglycerides: 118 mg/dL (ref 0.0–149.0)
VLDL: 23.6 mg/dL (ref 0.0–40.0)

## 2019-11-04 LAB — HEMOGLOBIN A1C: Hgb A1c MFr Bld: 6.7 % — ABNORMAL HIGH (ref 4.6–6.5)

## 2019-11-04 MED ORDER — ALBUTEROL SULFATE HFA 108 (90 BASE) MCG/ACT IN AERS: 2.0000 | INHALATION_SPRAY | Freq: Four times a day (QID) | RESPIRATORY_TRACT | 11 refills | Status: DC | PRN

## 2019-11-04 MED ORDER — BUDESONIDE-FORMOTEROL FUMARATE 80-4.5 MCG/ACT IN AERO
2.0000 | INHALATION_SPRAY | Freq: Two times a day (BID) | RESPIRATORY_TRACT | 11 refills | Status: DC
Start: 2019-11-04 — End: 2020-10-31

## 2019-11-04 NOTE — Assessment & Plan Note (Signed)
Chronic Controlled Taking Lyrica 75 mg nightly We will continue this dose

## 2019-11-04 NOTE — Assessment & Plan Note (Addendum)
Chronic BP well controlled Continue Bystolic 10 mg daily, Imdur 60 mg daily, losartan 50 mg daily and torsemide 20 mg daily cmp, CMP

## 2019-11-04 NOTE — Assessment & Plan Note (Signed)
Chronic Controlled Continue Metformin 500 mg XR daily with breakfast Check A1c

## 2019-11-04 NOTE — Assessment & Plan Note (Signed)
Chronic Check lipid panel, CMP Continue Repatha  healthy diet encouraged

## 2019-11-04 NOTE — Assessment & Plan Note (Addendum)
Chronic Overall controlled Symptoms worse with changes in weather Uses symbicort prn, either once or twice a day proventil prn Continue current regimen-refilled both inhalers today

## 2019-11-05 LAB — HEPATITIS C ANTIBODY
Hepatitis C Ab: NONREACTIVE
SIGNAL TO CUT-OFF: 0.02

## 2019-11-10 NOTE — Progress Notes (Signed)
Cardiology Office Note    Date:  11/11/2019   ID:  Samantha, [name] [birth year], MRN [id number]  PCP:  [name], MD  Cardiologist: Ena Dawley, MD EPS: None  No chief complaint on file.   History of Present Illness:  Samantha [name] is a 79 y.o. female with a history ofCAD moderate nonobstructive disease by cardiac CT 2016, cardiac cath 2018-40% mid RCA 50% mid LAD treated medically,Chronic diastolic CHF,Hypertension,HLD,DM type II,Palpitations secondary to symptomatic PVCs,History of TIA on Plavix.  Patient last saw [name] 07/03/2019 and had mild lower extremity edema felt possibly due to amlodipine.  This was stopped and she was put on Lasix 20 mg daily and then losartan was started 10/16/2019 for elevated blood pressures and lower extremity edema.  Rheumatologist had stopped her amlodipine and lisinopril which improved her breathing and cough she was referred to nephrology for polycystic kidney disease.  Patient is added onto my schedule for preoperative clearance before undergoing total knee replacement by Dr. Leana Gamer 12/04/2019. Has shoulder pains and associate quivering sensation in her chest at rest and eases in seconds. Gets dizzy and sleepy after taking her losartan, thinks the dose is too high. BP runs 120-130/60-80. Denies chest tightness/pressure,  palpitations.. Has chronic lower ext edema-wears support hose. Watches salt closely.  Cannot do much physically.  She has to pace herself or she gets out of breath and is also limited by her knee.   Past Medical History:  Diagnosis Date  . A-fib ([place])   . Abnormal CT of the chest 2008   last CT4-l 2009:  . No f/u suggested   . Allergy   . Asthma   . CAD (coronary artery disease)    a. Coronary CTA 10/16: Coronary Ca score 211, mod non-obstructive CAD with LM mild plaque (25-50%), mid LAD 50-69%. b. Neg nuc 06/2015.  . Cataract    BILATERAL-REMOVED  . Chronic diastolic CHF (congestive heart failure) ([place])    . Collagen vascular disease ([place])    "arterial sclerosis" per pt  . Complication of anesthesia    trouble waking up  . Fibromyalgia   . GERD (gastroesophageal reflux disease)   . H/O hiatal hernia   . Heart murmur   . Hyperlipidemia   . Hypertension   . Malignant neoplasm of ascending colon ([place]) 2016   Minimally invasive right hemicolectomy to be done   . Neuromuscular disorder ([facility])    FIBROMYALGIA  . Ocular migraine   . OSA (obstructive sleep apnea) 09/2007   dx w/ a sleep study, not on  CPAP  . Osteoarthritis   . Osteoporosis   . Pneumonia    "double" in 2004  . PONV (postoperative nausea and vomiting)   . Reactive airway disease 01/29/2002   dx of pseudoasthma / vcd in 2005 and nl sprirometry History of dyspnea, 2011,  improved after several medications were changed around Question of COPD, disproved July 06, 2009 with nl pft's      . Rheumatoid factor positive   . Shingles 11/2009  . Sleep apnea   . Stroke ([place])   . TIA (transient ischemic attack)    x2 - on Plavix for this  . Torn rotator cuff    right worse than left, both are torn  . Tumor, 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  . Type II diabetes mellitus ([place])   . 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 Vaginal dysplasia     Past Surgical  History:  Procedure Laterality Date  . ABDOMINAL HYSTERECTOMY  1980   NO oophorectomy per pt   . ANTERIOR CERVICAL DECOMP/DISCECTOMY FUSION  2001   C 3, C4 and C5 plate and screws  . BREAST BIOPSY Right 1999  . BUNIONECTOMY Left ~ 1977  . CARDIAC CATHETERIZATION     2018 By Dr. Pernell Dupre (done after colon surgery)  . CATARACT EXTRACTION W/ INTRAOCULAR LENS  IMPLANT, BILATERAL  2012  . COLON SURGERY  10/2014  . EYE SURGERY Bilateral    torq lens for cataracts  . LEFT HEART CATH AND CORONARY ANGIOGRAPHY N/A 03/22/2016   Procedure: Left Heart Cath and Coronary Angiography;  Surgeon: Belva Crome, MD;  Location: [facility];  Service: Cardiovascular;  Laterality: N/A;    . LUMBAR LAMINECTOMY/DECOMPRESSION MICRODISCECTOMY N/A 01/20/2018   Procedure: L4-5 decompression;  Surgeon: Marybelle Killings, MD;  Location: [facility];  Service: Orthopedics;  Laterality: N/A;  . THYROIDECTOMY, PARTIAL  1960's  . VAGINAL MASS EXCISION  1994   "Laser surgery for vaginal cancer; followed by chemotherapy" (06/27/2012)    Current Medications: Current Meds  Medication Sig  . acetaminophen (TYLENOL) 500 MG tablet Take 500 mg by mouth every 6 (six) hours as needed.  [name] albuterol (VENTOLIN HFA) 108 (90 Base) MCG/ACT inhaler Inhale 2 puffs into the lungs every 6 (six) hours as needed for wheezing or shortness of breath.  . budesonide-formoterol (SYMBICORT) 80-4.5 MCG/ACT inhaler Inhale 2 puffs into the lungs 2 (two) times daily.  . butalbital-acetaminophen-caffeine (FIORICET) 50-325-40 MG tablet Take 1 tablet by mouth every 6 (six) hours as needed for headache or migraine.  [name] BYSTOLIC 10 MG tablet TAKE 1 TABLET(10 MG) BY MOUTH DAILY  . CALCIUM PO Take 1 tablet by mouth every morning.   . cetirizine (ZYRTEC) 10 MG tablet TAKE 1 TABLET(10 MG) BY MOUTH DAILY  . cholecalciferol (VITAMIN D) 1000 UNITS tablet Take 1,000 Units by mouth every morning.   . cholestyramine (QUESTRAN) 4 g packet Take 1 packet (4 g total) by mouth daily.  . clopidogrel (PLAVIX) 75 MG tablet TAKE 1 TABLET(75 MG) BY MOUTH DAILY  . Evolocumab (REPATHA SURECLICK) 209 MG/ML SOAJ Inject 1 pen into the skin every 14 (fourteen) days.  . hydrocortisone 2.5 % cream Apply topically 2 (two) times daily.  . hydroxychloroquine (PLAQUENIL) 200 MG tablet hydroxychloroquine 200 mg tablet  . isosorbide mononitrate (IMDUR) 60 MG 24 hr tablet TAKE 1 TABLET(60 MG) BY MOUTH DAILY  . losartan (COZAAR) 25 MG tablet Take 1 tablet (25 mg total) by mouth daily.  . Magnesium 100 MG CAPS Take by mouth.  . metFORMIN (GLUCOPHAGE-XR) 500 MG 24 hr tablet TAKE 1 TABLET(500 MG) BY MOUTH DAILY WITH BREAKFAST  . Polyethyl Glycol-Propyl Glycol (SYSTANE)  0.4-0.3 % GEL Place 1 application into both eyes daily as needed (for dry eyes).   . Potassium 99 MG TABS Take 99 mg by mouth daily.   . pramoxine-hydrocortisone cream hydrocortisone-pramoxine 2.5 %-1 % topical cream  INSERT RECTALLY TO THE AFFECTED AREA TWICE DAILY AS NEEDED  . pregabalin (LYRICA) 75 MG capsule TAKE 1 CAPSULE(75 MG) BY MOUTH AT BEDTIME  . torsemide (DEMADEX) 20 MG tablet TAKE 1 TABLET(20 MG) BY MOUTH DAILY  . [DISCONTINUED] losartan (COZAAR) 50 MG tablet Take 1 tablet (50 mg total) by mouth daily.     Allergies:   Bactrim [sulfamethoxazole-trimethoprim], Cefuroxime axetil, Oxycodone, Pravastatin, Seldane [terfenadine], Zocor [simvastatin], Tramadol, Atorvastatin, Lime flavor [flavoring agent], Rosuvastatin, and Tape   Social History  Socioeconomic History  . Marital status: Married    Spouse name: Samantha [name]  . Number of children: 2  . Years of education: masters  . Highest education level: Not on file  Occupational History  . Occupation: Retired, disable since 2000    Employer: RETIRED  Tobacco Use  . Smoking status: Former Smoker    Packs/day: 0.25    Years: 5.00    Pack years: 1.25    Types: Cigarettes    Quit date: 01/29/1998    Years since quitting: 21.7  . Smokeless tobacco: Never Used  . Tobacco comment: Quit in 2001  Vaping Use  . Vaping Use: Never used  Substance and Sexual Activity  . Alcohol use: No    Alcohol/week: 0.0 standard drinks  . Drug use: No  . Sexual activity: Never  Other Topics Concern  . Not on file  Social History Narrative   On disability since 2000--- also husband has MS   Education. College.   Right handed.   Social Determinants of Health   Financial Resource Strain: Low Risk   . Difficulty of Paying Living Expenses: Not hard at all  Food Insecurity: No Food Insecurity  . Worried About Charity fundraiser in the Last Year: Never true  . Ran Out of Food in the Last Year: Never true  Transportation Needs: No Transportation  Needs  . Lack of Transportation (Medical): No  . Lack of Transportation (Non-Medical): No  Physical Activity: Sufficiently Active  . Days of Exercise per Week: 7 days  . Minutes of Exercise per Session: 30 min  Stress: No Stress Concern Present  . Feeling of Stress : Not at all  Social Connections:   . Frequency of Communication with Friends and Family: Not on file  . Frequency of Social Gatherings with Friends and Family: Not on file  . Attends Religious Services: Not on file  . Active Member of Clubs or Organizations: Not on file  . Attends Archivist Meetings: Not on file  . Marital Status: Not on file     Family History:  The patient's family history includes Allergies in her sister; Aneurysm in her brother; Asthma in her paternal grandmother and sister; Diabetes in her brother and brother; Emphysema in her brother; Heart disease in her brother, father, mother, and another family member; Kidney failure in her brother; Lung cancer in her mother; Parkinsonism in her sister; Stroke in her brother, maternal grandmother, and paternal grandmother.   ROS:   Please see the history of present illness.    ROS All other systems reviewed and are negative.   PHYSICAL EXAM:   VS:  BP 130/66   Pulse 63   Ht 5\' 2"  (1.575 m)   Wt 199 lb 12.8 oz (90.6 kg)   SpO2 95%   BMI 36.54 kg/m   Physical Exam  GEN: Well nourished, well developed, in no acute distress  Neck: no JVD, carotid bruits, or masses Cardiac:RRR; no murmurs, rubs, or gallops  Respiratory:  clear to auscultation bilaterally, normal work of breathing GI: soft, nontender, nondistended, + BS Ext: Trace of ankle edema without cyanosis, clubbing,  Good distal pulses bilaterally Neuro:  Alert and Oriented x 3 Psych: euthymic mood, full affect  Wt Readings from Last 3 Encounters:  11/11/19 199 lb 12.8 oz (90.6 kg)  11/04/19 197 lb (89.4 kg)  10/23/19 197 lb (89.4 kg)      Studies/Labs Reviewed:   EKG:  EKG is   ordered today.  The ekg ordered today demonstrates normal sinus rhythm with poor R wave progression anteriorly no acute change  Recent Labs: 11/04/2019: ALT 14; BUN 23; Creatinine, Ser 1.12; Hemoglobin 12.6; Platelets 133.0; Potassium 4.0; Sodium 139   Lipid Panel    Component Value Date/Time   CHOL 140 11/04/2019 1049   CHOL 135 10/01/2018 1036   TRIG 118.0 11/04/2019 1049   HDL 81.20 11/04/2019 1049   HDL 84 10/01/2018 1036   CHOLHDL 2 11/04/2019 1049   VLDL 23.6 11/04/2019 1049   LDLCALC 35 11/04/2019 1049   LDLCALC 34 10/01/2018 1036   LDLDIRECT 126.2 12/07/2010 1039    Additional studies/ records that were reviewed today include:  Cardiac cath 2018 Widely patent coronary arteries with 30-50% mid LAD narrowing and 40% mid RCA narrowing. Coronary arteries are tortuous. Right dominant coronary anatomy. LAD wraps around the left ventricular apex. Normal LV function. Normal LVEDP. EF 60%. Suspect chest discomfort is not ischemic.     ASSESSMENT:    1. Preoperative clearance   2. Coronary artery disease involving native coronary artery of native heart without angina pectoris   3. Chronic diastolic CHF (congestive heart failure) ([place])   4. Essential hypertension   5. Hyperlipidemia, unspecified hyperlipidemia type   6. Palpitations   7. TIA (transient ischemic attack)   8. 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 Leana Gamer 12/04/2019.  Patient is cardiac risk index is elevated because of history of TIA, CAD.  Her METs are 4 but she is unable to do a whole lot because of her knee and her shortness of breath.  She has chronic dyspnea on exertion with little activity and complains of chronic lower extremity edema.  We will  update echo and Lexiscan prior to surgical clearance.  Plavix is managed by Dr. Quay Burow for history of TIA and I believe she is already addressed this. According to the Revised Cardiac Risk Index (RCRI), her Perioperative Risk of Major Cardiac Event is (%): 11  Her Functional Capacity in METs is: 4.06 according to the [facility] Activity Status Index (DASI).    CAD moderate nonobstructive disease by cardiac CT 2016, cardiac cath 2000 1840% mid RCA 50% mid LAD treated medically no chest tightness but does have dyspnea on exertion  Chronic diastolic CHF now on losartan and Demedex  Hypertension amlodipine and lisinopril stopped for edema and cough, now on losartan but complains of increased dizziness and sleepiness after taking.  She thinks it is too strong.  She cannot drive if she takes losartan.  We will try to decrease to 25 mg once daily and she will monitor her blood pressures at home.  HLD L 34 on Repatha  Palpitations secondary to symptomatic PVCs on Bystolic.  Patient's insurance may not cover this starting the first of the year but she does have a secondary insurance and will check to see how much it will cost her.  If not we will need to change beta-blockers.  History of TIA on Plavix managed by Dr. Quay Burow  DM type II on Repatha LDL 35 10/2019  Medication Adjustments/Labs and Tests Ordered: Current medicines are reviewed at length with the patient today.  Concerns regarding medicines are outlined above.  Medication changes, Labs and Tests ordered today are listed in the Patient  Instructions below. Patient Instructions  Medication Instructions:  Your physician has recommended you make the following change in your medication:   DECREASE Losartan to 25mg  daily  *If you need a refill on your cardiac medications before your next appointment, please call your pharmacy*   Lab Work: None If you have labs (blood work) drawn today and your tests are completely normal, you will receive your  results only by: [name] MyChart Message (if you have MyChart) OR . A paper copy in the mail If you have any lab test that is abnormal or we need to change your treatment, we will call you to review the results.   Testing/Procedures: Your physician has requested that you have an echocardiogram. Echocardiography is a painless test that uses sound waves to create images of your heart. It provides your doctor with information about the size and shape of your heart and how well your heart's chambers and valves are working. This procedure takes approximately one hour. There are no restrictions for this procedure.  Your physician has requested that you have a lexiscan myoview. For further information please visit [email or web address]. Please follow instruction sheet, as given.     Follow-Up: At [facility], you and your health needs are our priority.  As part of our continuing mission to provide you with exceptional heart care, we have created designated Provider Care Teams.  These Care Teams include your primary Cardiologist (physician) and Advanced Practice Providers (APPs -  Physician Assistants and Nurse Practitioners) who all work together to provide you with the care you need, when you need it.  We recommend signing up for the patient portal called "MyChart".  Sign up information is provided on this After Visit Summary.  MyChart is used to connect with patients for Virtual Visits (Telemedicine).  Patients are able to view lab/test results, encounter notes, upcoming appointments, etc.  Non-urgent messages can be sent to your provider as well.   To learn more about what you can do with MyChart, go to [email or web address].    Your next appointment:   6 month(s)  The format for your next appointment:   In Person  Provider:   You may see Ena Dawley, MD or one of the following Advanced Practice Providers on your designated Care Team:    Melina Copa, PA-C  [name], PA-C        Signed, [name], PA-C  11/11/2019 10:35 AM    [address], [place], [place]  [address] Phone: [phone]; Fax: [phone]

## 2019-11-11 ENCOUNTER — Other Ambulatory Visit: Payer: Self-pay

## 2019-11-11 ENCOUNTER — Ambulatory Visit (INDEPENDENT_AMBULATORY_CARE_PROVIDER_SITE_OTHER): Payer: Medicare Other | Admitting: Physician Assistant

## 2019-11-11 ENCOUNTER — Encounter: Payer: Self-pay | Admitting: Physician Assistant

## 2019-11-11 VITALS — BP 130/66 | HR 63 | Ht 62.0 in | Wt 199.8 lb

## 2019-11-11 DIAGNOSIS — I2510 Atherosclerotic heart disease of native coronary artery without angina pectoris: Secondary | ICD-10-CM | POA: Diagnosis not present

## 2019-11-11 DIAGNOSIS — I5032 Chronic diastolic (congestive) heart failure: Secondary | ICD-10-CM

## 2019-11-11 DIAGNOSIS — Z794 Long term (current) use of insulin: Secondary | ICD-10-CM | POA: Diagnosis not present

## 2019-11-11 DIAGNOSIS — E11311 Type 2 diabetes mellitus with unspecified diabetic retinopathy with macular edema: Secondary | ICD-10-CM

## 2019-11-11 DIAGNOSIS — Z01818 Encounter for other preprocedural examination: Secondary | ICD-10-CM | POA: Diagnosis not present

## 2019-11-11 DIAGNOSIS — G459 Transient cerebral ischemic attack, unspecified: Secondary | ICD-10-CM

## 2019-11-11 DIAGNOSIS — E785 Hyperlipidemia, unspecified: Secondary | ICD-10-CM

## 2019-11-11 DIAGNOSIS — R002 Palpitations: Secondary | ICD-10-CM

## 2019-11-11 DIAGNOSIS — I10 Essential (primary) hypertension: Secondary | ICD-10-CM

## 2019-11-11 DIAGNOSIS — I2583 Coronary atherosclerosis due to lipid rich plaque: Secondary | ICD-10-CM

## 2019-11-11 MED ORDER — LOSARTAN POTASSIUM 25 MG PO TABS: 25.0000 mg | ORAL_TABLET | Freq: Every day | ORAL | 3 refills | Status: DC

## 2019-11-11 NOTE — Patient Instructions (Addendum)
Medication Instructions:  Your physician has recommended you make the following change in your medication:   DECREASE Losartan to 25mg  daily  *If you need a refill on your cardiac medications before your next appointment, please call your pharmacy*   Lab Work: None If you have labs (blood work) drawn today and your tests are completely normal, you will receive your results only by: [name] MyChart Message (if you have MyChart) OR . A paper copy in the mail If you have any lab test that is abnormal or we need to change your treatment, we will call you to review the results.   Testing/Procedures: Your physician has requested that you have an echocardiogram. Echocardiography is a painless test that uses sound waves to create images of your heart. It provides your doctor with information about the size and shape of your heart and how well your heart's chambers and valves are working. This procedure takes approximately one hour. There are no restrictions for this procedure.  Your physician has requested that you have a lexiscan myoview. For further information please visit [email or web address]. Please follow instruction sheet, as given.     Follow-Up: At [facility], you and your health needs are our priority.  As part of our continuing mission to provide you with exceptional heart care, we have created designated Provider Care Teams.  These Care Teams include your primary Cardiologist (physician) and Advanced Practice Providers (APPs -  Physician Assistants and Nurse Practitioners) who all work together to provide you with the care you need, when you need it.  We recommend signing up for the patient portal called "MyChart".  Sign up information is provided on this After Visit Summary.  MyChart is used to connect with patients for Virtual Visits (Telemedicine).  Patients are able to view lab/test results, encounter notes, upcoming appointments, etc.  Non-urgent messages can be sent to your provider  as well.   To learn more about what you can do with MyChart, go to [email or web address].    Your next appointment:   6 month(s)  The format for your next appointment:   In Person  Provider:   You may see Ena Dawley, MD or one of the following Advanced Practice Providers on your designated Care Team:    Melina Copa, PA-C  [name], PA-C

## 2019-11-12 ENCOUNTER — Telehealth (HOSPITAL_COMMUNITY): Payer: Self-pay | Admitting: *Deleted

## 2019-11-12 NOTE — Telephone Encounter (Signed)
Patient given detailed instructions per Myocardial Perfusion Study Information Sheet for the test on 11/18/19. Patient notified to arrive 15 minutes early and that it is imperative to arrive on time for appointment to keep from having the test rescheduled.  If you need to cancel or reschedule your appointment, please call the office within 24 hours of your appointment. . Patient verbalized understanding. Kirstie Peri

## 2019-11-13 ENCOUNTER — Ambulatory Visit (HOSPITAL_COMMUNITY): Payer: Medicare Other | Attending: Cardiology

## 2019-11-13 ENCOUNTER — Other Ambulatory Visit: Payer: Self-pay

## 2019-11-13 DIAGNOSIS — I5032 Chronic diastolic (congestive) heart failure: Secondary | ICD-10-CM

## 2019-11-13 DIAGNOSIS — Z01810 Encounter for preprocedural cardiovascular examination: Secondary | ICD-10-CM

## 2019-11-13 DIAGNOSIS — R002 Palpitations: Secondary | ICD-10-CM | POA: Diagnosis not present

## 2019-11-13 DIAGNOSIS — Z01818 Encounter for other preprocedural examination: Secondary | ICD-10-CM | POA: Insufficient documentation

## 2019-11-13 DIAGNOSIS — I2510 Atherosclerotic heart disease of native coronary artery without angina pectoris: Secondary | ICD-10-CM | POA: Insufficient documentation

## 2019-11-13 LAB — ECHOCARDIOGRAM COMPLETE
Area-P 1/2: 3.17 cm2
S' Lateral: 2.2 cm

## 2019-11-18 ENCOUNTER — Ambulatory Visit (HOSPITAL_COMMUNITY): Payer: Medicare Other | Attending: Cardiovascular Disease

## 2019-11-18 ENCOUNTER — Other Ambulatory Visit: Payer: Self-pay

## 2019-11-18 DIAGNOSIS — I2510 Atherosclerotic heart disease of native coronary artery without angina pectoris: Secondary | ICD-10-CM | POA: Insufficient documentation

## 2019-11-18 DIAGNOSIS — I5032 Chronic diastolic (congestive) heart failure: Secondary | ICD-10-CM | POA: Diagnosis not present

## 2019-11-18 DIAGNOSIS — I10 Essential (primary) hypertension: Secondary | ICD-10-CM | POA: Diagnosis not present

## 2019-11-18 DIAGNOSIS — Z01818 Encounter for other preprocedural examination: Secondary | ICD-10-CM | POA: Diagnosis not present

## 2019-11-18 LAB — MYOCARDIAL PERFUSION IMAGING
LV dias vol: 49 mL (ref 46–106)
LV sys vol: 18 mL
Peak HR: 93 {beats}/min
Rest HR: 66 {beats}/min
SDS: 2
SRS: 0
SSS: 2
TID: 0.89

## 2019-11-18 MED ORDER — TECHNETIUM TC 99M TETROFOSMIN IV KIT
30.9000 | PACK | Freq: Once | INTRAVENOUS | Status: AC | PRN
  Administered 2019-11-18: 30.9 via INTRAVENOUS
  Filled 2019-11-18: qty 31

## 2019-11-18 MED ORDER — TECHNETIUM TC 99M TETROFOSMIN IV KIT
10.1000 | PACK | Freq: Once | INTRAVENOUS | Status: AC | PRN
  Administered 2019-11-18: 10.1 via INTRAVENOUS
  Filled 2019-11-18: qty 11

## 2019-11-18 MED ORDER — REGADENOSON 0.4 MG/5ML IV SOLN
0.4000 mg | Freq: Once | INTRAVENOUS | Status: AC
  Administered 2019-11-18: 0.4 mg via INTRAVENOUS

## 2019-11-19 ENCOUNTER — Telehealth: Payer: Self-pay

## 2019-11-19 ENCOUNTER — Ambulatory Visit (INDEPENDENT_AMBULATORY_CARE_PROVIDER_SITE_OTHER): Payer: Medicare Other | Admitting: Surgery

## 2019-11-19 ENCOUNTER — Encounter: Payer: Self-pay | Admitting: Surgery

## 2019-11-19 VITALS — BP 150/76 | HR 70 | Ht 62.0 in | Wt 199.0 lb

## 2019-11-19 DIAGNOSIS — M1711 Unilateral primary osteoarthritis, right knee: Secondary | ICD-10-CM

## 2019-11-19 NOTE — Telephone Encounter (Signed)
Spoke with patient today and info given. She will d/c Plavix on next Friday, October 29th and resume ASAP following surgery.

## 2019-11-19 NOTE — Telephone Encounter (Signed)
Stop plavix 5 days prior and restart as soon as possible

## 2019-11-19 NOTE — Progress Notes (Signed)
79 year old white female history of end-stage DJD right knee comes in for preop evaluation.  States that knee symptoms unchanged from previous visit.  She is want to proceed with right total knee replacement scheduled.  Today history and physical performed.  Patient admits to occasional exertional dyspnea and also some wheezing.  States that she does use her inhalers.  We are awaiting preop medical and cardiac clearances.  Patient currently on Plavix.  States that she had a stress test with cardiology yesterday but we do not have final clearance with the results of that study in the system yet.  Surgical procedure discussed along with potential hospital stay.  All questions answered.  Patient has a couple of family members at home who may be able to offer some assistance.  She will speak to her family to see how much time they would be able to help her and I advised patient that short skilled placement could be worked out if needed.

## 2019-11-20 ENCOUNTER — Telehealth: Payer: Self-pay | Admitting: *Deleted

## 2019-11-20 NOTE — Telephone Encounter (Signed)
Our office received another request for clearance. I will send ov notes and lexiscan results which give cardiac clearance to requesting. In regards to Plavix for TIA this is followed by Dr. Quay Burow and clearance for Plavix will need to come from Dr. Quay Burow.

## 2019-11-26 NOTE — Pre-Procedure Instructions (Addendum)
Your procedure is scheduled on Friday, November 5th at 7:30a.m.  Report to [facility] Main Entrance "A" at 5:30 A.M., and check in at the Admitting office.  Call this number if you have problems the morning of surgery:  [phone]  Call [phone] if you have any questions prior to your surgery date Monday-Friday 8am-4pm    Remember:  Do not eat after midnight the night before your surgery  You may drink clear liquids until 4:30a.m. the morning of your surgery.   Clear liquids allowed are: Water, Non-Citrus Juices (without pulp), Carbonated Beverages, Clear Tea, Black Coffee Only, and Gatorade.   Enhanced Recovery after Surgery for Orthopedics Enhanced Recovery after Surgery is a protocol used to improve the stress on your body and your recovery after surgery.  Patient Instructions  . The night before surgery:  o No food after midnight. ONLY clear liquids after midnight  . The day of surgery (if you have diabetes):  o Drink (1) 10oz bottle of water by 4:30 a.m. the morning of surgery o This drink was given to you during your hospital  pre-op appointment visit.          If you have questions, please contact your surgeon's office.     Take these medicines the morning of surgery with A SIP OF WATER  BYSTOLIC cetirizine (ZYRTEC)  isosorbide mononitrate (IMDUR)  budesonide-formoterol (SYMBICORT)  As needed: acetaminophen (TYLENOL) Albuterol inhaler-please bring this with you to the hospital. butalbital-acetaminophen-caffeine (FIORICET)  Please stop clopidogrel (PLAVIX) as of 11/27/19 until after surgery.   As of today, STOP taking any Aspirin (unless otherwise instructed by your surgeon) Aleve, Naproxen, Ibuprofen, Motrin, Advil, Goody's, BC's, all herbal medications, fish oil, and all vitamins.   WHAT DO I DO ABOUT MY DIABETES MEDICATION?  DO NOT TAKE metFORMIN (GLUCOPHAGE-XR) the morning of surgery.    HOW TO MANAGE YOUR DIABETES BEFORE AND AFTER SURGERY  Why  is it important to control my blood sugar before and after surgery? . Improving blood sugar levels before and after surgery helps healing and can limit problems. . A way of improving blood sugar control is eating a healthy diet by: o  Eating less sugar and carbohydrates o  Increasing activity/exercise o  Talking with your doctor about reaching your blood sugar goals . High blood sugars (greater than 180 mg/dL) can raise your risk of infections and slow your recovery, so you will need to focus on controlling your diabetes during the weeks before surgery. . Make sure that the doctor who takes care of your diabetes knows about your planned surgery including the date and location.  How do I manage my blood sugar before surgery? . Check your blood sugar at least 4 times a day, starting 2 days before surgery, to make sure that the level is not too high or low. . Check your blood sugar the morning of your surgery when you wake up and every 2 hours until you get to the Short Stay unit. o If your blood sugar is less than 70 mg/dL, you will need to treat for low blood sugar: - Do not take insulin. - Treat a low blood sugar (less than 70 mg/dL) with  cup of clear juice (cranberry or apple), 4 glucose tablets, OR glucose gel. - Recheck blood sugar in 15 minutes after treatment (to make sure it is greater than 70 mg/dL). If your blood sugar is not greater than 70 mg/dL on recheck, call [phone] for further instructions. . Report your blood  sugar to the short stay nurse when you get to Short Stay.  . If you are admitted to the hospital after surgery: o Your blood sugar will be checked by the staff and you will probably be given insulin after surgery (instead of oral diabetes medicines) to make sure you have good blood sugar levels. o The goal for blood sugar control after surgery is 80-180 mg/dL.              Do not wear jewelry, make up, or nail polish.            Do not wear lotions, powders,  perfumes, or deodorant.            Do not shave 48 hours prior to surgery.              Do not bring valuables to the hospital.            [facility] is not responsible for any belongings or valuables.  Do NOT Smoke (Tobacco/Vaping) or drink Alcohol 24 hours prior to your procedure If you use a CPAP at night, you may bring all equipment for your overnight stay.   Contacts, glasses, dentures or bridgework may not be worn into surgery.      For patients admitted to the hospital, discharge time will be determined by your treatment team.   Patients discharged the day of surgery will not be allowed to drive home, and someone needs to stay with them for 24 hours.    Special instructions:   [place]- Preparing For Surgery  Before surgery, you can play an important role. Because skin is not sterile, your skin needs to be as free of germs as possible. You can reduce the number of germs on your skin by washing with CHG (chlorahexidine gluconate) Soap before surgery.  CHG is an antiseptic cleaner which kills germs and bonds with the skin to continue killing germs even after washing.    Oral Hygiene is also important to reduce your risk of infection.  Remember - BRUSH YOUR TEETH THE MORNING OF SURGERY WITH YOUR REGULAR TOOTHPASTE  Please do not use if you have an allergy to CHG or antibacterial soaps. If your skin becomes reddened/irritated stop using the CHG.  Do not shave (including legs and underarms) for at least 48 hours prior to first CHG shower. It is OK to shave your face.  Please follow these instructions carefully.   1. Shower the NIGHT BEFORE SURGERY and the MORNING OF SURGERY with CHG Soap.   2. If you chose to wash your hair, wash your hair first as usual with your normal shampoo.  3. After you shampoo, rinse your hair and body thoroughly to remove the shampoo.  4. Use CHG as you would any other liquid soap. You can apply CHG directly to the skin and wash gently with a scrungie  or a clean washcloth.   5. Apply the CHG Soap to your body ONLY FROM THE NECK DOWN.  Do not use on open wounds or open sores. Avoid contact with your eyes, ears, mouth and genitals (private parts). Wash Face and genitals (private parts)  with your normal soap.   6. Wash thoroughly, paying special attention to the area where your surgery will be performed.  7. Thoroughly rinse your body with warm water from the neck down.  8. DO NOT shower/wash with your normal soap after using and rinsing off the CHG Soap.  9. Pat yourself dry with a  CLEAN TOWEL.  10. Wear CLEAN PAJAMAS to bed the night before surgery  11. Place CLEAN SHEETS on your bed the night of your first shower and DO NOT SLEEP WITH PETS.   Day of Surgery: Wear Clean/Comfortable clothing the morning of surgery Do not apply any deodorants/lotions.   Remember to brush your teeth WITH YOUR REGULAR TOOTHPASTE.   Please read over the following fact sheets that you were given.

## 2019-11-27 ENCOUNTER — Ambulatory Visit (HOSPITAL_COMMUNITY)
Admission: RE | Admit: 2019-11-27 | Discharge: 2019-11-27 | Disposition: A | Discharge status: Home / Self Care | Payer: Medicare Other | Source: Ambulatory Visit | Attending: Surgery | Admitting: Surgery

## 2019-11-27 ENCOUNTER — Encounter (HOSPITAL_COMMUNITY)
Admission: RE | Admit: 2019-11-27 | Discharge: 2019-11-27 | Disposition: A | Discharge status: Home / Self Care | Payer: Medicare Other | Source: Ambulatory Visit | Attending: Orthopaedic Surgery | Admitting: Orthopaedic Surgery

## 2019-11-27 ENCOUNTER — Encounter (HOSPITAL_COMMUNITY): Payer: Self-pay

## 2019-11-27 ENCOUNTER — Other Ambulatory Visit: Payer: Self-pay

## 2019-11-27 DIAGNOSIS — M47814 Spondylosis without myelopathy or radiculopathy, thoracic region: Secondary | ICD-10-CM | POA: Diagnosis not present

## 2019-11-27 DIAGNOSIS — I5032 Chronic diastolic (congestive) heart failure: Secondary | ICD-10-CM | POA: Insufficient documentation

## 2019-11-27 DIAGNOSIS — I110 Hypertensive heart disease with heart failure: Secondary | ICD-10-CM | POA: Insufficient documentation

## 2019-11-27 DIAGNOSIS — I2510 Atherosclerotic heart disease of native coronary artery without angina pectoris: Secondary | ICD-10-CM | POA: Diagnosis not present

## 2019-11-27 DIAGNOSIS — Z01818 Encounter for other preprocedural examination: Secondary | ICD-10-CM | POA: Diagnosis not present

## 2019-11-27 DIAGNOSIS — J849 Interstitial pulmonary disease, unspecified: Secondary | ICD-10-CM | POA: Diagnosis not present

## 2019-11-27 LAB — URINALYSIS, ROUTINE W REFLEX MICROSCOPIC
Bacteria, UA: NONE SEEN
Bilirubin Urine: NEGATIVE
Glucose, UA: NEGATIVE mg/dL
Hgb urine dipstick: NEGATIVE
Ketones, ur: NEGATIVE mg/dL
Nitrite: NEGATIVE
Protein, ur: NEGATIVE mg/dL
Specific Gravity, Urine: 1.014 (ref 1.005–1.030)
pH: 5 (ref 5.0–8.0)

## 2019-11-27 LAB — CBC
HCT: 37 % (ref 36.0–46.0)
Hemoglobin: 12 g/dL (ref 12.0–15.0)
MCH: 28.7 pg (ref 26.0–34.0)
MCHC: 32.4 g/dL (ref 30.0–36.0)
MCV: 88.5 fL (ref 80.0–100.0)
Platelets: 222 10*3/uL (ref 150–400)
RBC: 4.18 MIL/uL (ref 3.87–5.11)
RDW: 13.7 % (ref 11.5–15.5)
WBC: 4 10*3/uL (ref 4.0–10.5)
nRBC: 0 % (ref 0.0–0.2)

## 2019-11-27 LAB — COMPREHENSIVE METABOLIC PANEL
ALT: 17 U/L (ref 0–44)
AST: 17 U/L (ref 15–41)
Albumin: 4 g/dL (ref 3.5–5.0)
Alkaline Phosphatase: 43 U/L (ref 38–126)
Anion gap: 12 (ref 5–15)
BUN: 28 mg/dL — ABNORMAL HIGH (ref 8–23)
CO2: 24 mmol/L (ref 22–32)
Calcium: 9.3 mg/dL (ref 8.9–10.3)
Chloride: 104 mmol/L (ref 98–111)
Creatinine, Ser: 1.34 mg/dL — ABNORMAL HIGH (ref 0.44–1.00)
GFR, Estimated: 41 mL/min — ABNORMAL LOW (ref >60)
Glucose, Bld: 121 mg/dL — ABNORMAL HIGH (ref 70–99)
Potassium: 4 mmol/L (ref 3.5–5.1)
Sodium: 140 mmol/L (ref 135–145)
Total Bilirubin: 0.5 mg/dL (ref 0.3–1.2)
Total Protein: 7.1 g/dL (ref 6.5–8.1)

## 2019-11-27 LAB — GLUCOSE, CAPILLARY: Glucose-Capillary: 130 mg/dL — ABNORMAL HIGH (ref 70–99)

## 2019-11-27 LAB — SURGICAL PCR SCREEN
MRSA, PCR: POSITIVE — AB
Staphylococcus aureus: POSITIVE — AB

## 2019-11-27 NOTE — Progress Notes (Signed)
PCP - Dr. Billey Gosling, MD Cardiologist - Dr. Liane Comber   Chest x-ray - 11/27/19 EKG - 11/11/19 Stress Test - 11/18/19 ECHO - 11/13/19 Cardiac Cath - 03/22/16  Sleep Study - pt states she is OSA+ CPAP - denies use. Pt states that she is unable to tolerate a CPAP but is looking into getting one that does not require a full face mask.   Fasting Blood Sugar - 110-130 Checks Blood Sugar 3 x's a week CBG at PAT 130. Last A1C was drawn on 11/04/19-6.7  Blood Thinner Instructions: According to cardiologist, pt is to stop plavix on 11/27/19 and resume after surgery.  Aspirin Instructions: n/a  ERAS Protcol -Yes PRE-SURGERY Ensure or G2- pt provided with a 10oz bottle of water d/t diabetes.   COVID TEST- 12/01/19   Anesthesia review: Yes, cardiac clearance noted in the stress test results comment section on 11/18/19  Patient denies shortness of breath, fever, cough and chest pain at PAT appointment   All instructions explained to the patient, with a verbal understanding of the material. Patient agrees to go over the instructions while at home for a better understanding. Patient also instructed to self quarantine after being tested for COVID-19. The opportunity to ask questions was provided.    Coronavirus Screening  Have you experienced the following symptoms:  Cough yes/no: No Fever (>100.51F)  yes/no: No Runny nose yes/no: No Sore throat yes/no: No Difficulty breathing/shortness of breath  yes/no: No  Have you or a family member traveled in the last 14 days and where? yes/no: No   If the patient indicates "YES" to the above questions, their PAT will be rescheduled to limit the exposure to others and, the surgeon will be notified. THE PATIENT WILL NEED TO BE ASYMPTOMATIC FOR 14 DAYS.   If the patient is not experiencing any of these symptoms, the PAT nurse will instruct them to NOT bring anyone with them to their appointment since they may have these symptoms or traveled as well.    Please remind your patients and families that hospital visitation restrictions are in effect and the importance of the restrictions.

## 2019-11-27 NOTE — H&P (Signed)
TOTAL KNEE ADMISSION H&P  Patient is being admitted for right total knee arthroplasty.  Subjective:  Chief Complaint:right knee pain.  HPI: Samantha [name], 79 y.o. female, has a history of pain and functional disability in the right knee due to arthritis and has failed non-surgical conservative treatments for greater than 12 weeks to includeNSAID's and/or analgesics, corticosteriod injections, use of assistive devices and activity modification.  Onset of symptoms was gradual, starting 10 years ago with gradually worsening course since that time. The patient noted no past surgery on the right knee(s).  Patient currently rates pain in the right knee(s) at 10 out of 10 with activity. Patient has night pain, worsening of pain with activity and weight bearing, pain that interferes with activities of daily living, crepitus and joint swelling.  Patient has evidence of subchondral sclerosis, periarticular osteophytes and joint space narrowing by imaging studies.  There is no active infection.  Patient admits to occasional exertional dyspnea and also some wheezing.  States that she does use her inhalers.  We have received preop medical and cardiac clearance.  Patient was instructed by Dr. Quay Burow to stop Plavix 5 days prior to surgery.     Patient Active Problem List   Diagnosis Date Noted  . Aortic atherosclerosis ([place]) 11/03/2019  . Posterior vitreous detachment of right eye 07/15/2019  . Posterior vitreous detachment of left eye 07/15/2019  . 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 Diabetes mellitus without complication ([place]) 35/32/9924  . 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 Raynaud phenomenon 05/06/2019  . Trigger finger, left ring finger 03/18/2019  . Unilateral primary osteoarthritis, right knee 01/28/2018  . 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 01/28/2018  . Sacral back pain 10/16/2017  . Chronic left upper quadrant pain 07/23/2017  . Rectal  bleeding 07/23/2017  . Poor balance 03/14/2017  . Right shoulder pain 03/14/2017  . Angina pectoris ([place]) 03/22/2016  . Hypertensive heart disease 07/07/2015  . Essential hypertension 07/07/2015  . Plantar fasciitis, left 03/22/2015  . Coronary artery disease due to lipid rich plaque 01/05/2015  . Chronic diastolic CHF (congestive heart failure), NYHA class 2 ([place]) 01/05/2015  . 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 Migraine (Ocular) 01/05/2014  . VBI (vertebrobasilar insufficiency) 08/22/2012  . TIA (transient ischemic attack) 06/27/2012  . DJD (degenerative joint disease) 02/02/2011  . 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 Postherpetic neuralgia ? 01/02/2010  . Palpitations 11/23/2008  . UTI'S, RECURRENT 09/28/2008  . Fibromyalgia 08/15/2007  . 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 Hyperlipidemia 05/21/2006  . Reactive airway disease 01/29/2002   Past Medical History:  Diagnosis Date  . A-fib ([place])   . Abnormal CT of the chest 2008   last CT4-l 2009:  . No f/u suggested   . Allergy   . Asthma   . CAD (coronary artery disease)    a. Coronary CTA 10/16: Coronary Ca score 211, mod non-obstructive CAD with LM mild plaque (25-50%), mid LAD 50-69%. b. Neg nuc 06/2015.  . Cataract    BILATERAL-REMOVED  . Chronic diastolic CHF (congestive heart failure) ([place])   . Collagen vascular disease ([place])    "arterial sclerosis" per pt  . Complication of anesthesia    trouble waking up  . Fibromyalgia   . GERD (gastroesophageal reflux disease)   . H/O hiatal hernia   . Heart murmur   . Hyperlipidemia   . Hypertension   .  Malignant neoplasm of ascending colon ([place]) 2016   Minimally invasive right hemicolectomy to be done   . Neuromuscular disorder ([facility])    FIBROMYALGIA  . Ocular migraine   . OSA (obstructive sleep apnea) 09/2007   dx w/ a sleep study, not on  CPAP  . Osteoarthritis   . Osteoporosis    . Pneumonia    "double" in 2004  . PONV (postoperative nausea and vomiting)   . Reactive airway disease 01/29/2002   dx of pseudoasthma / vcd in 2005 and nl sprirometry History of dyspnea, 2011,  improved after several medications were changed around Question of COPD, disproved July 06, 2009 with nl pft's      . Rheumatoid factor positive   . Shingles 11/2009  . Sleep apnea   . Stroke ([place])   . TIA (transient ischemic attack)    x2 - on Plavix for this  . Torn rotator cuff    right worse than left, both are torn  . Tumor, 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  . Type II diabetes mellitus ([place])   . 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 Vaginal dysplasia     Past Surgical History:  Procedure Laterality Date  . ABDOMINAL HYSTERECTOMY  1980   NO oophorectomy per pt   . ANTERIOR CERVICAL DECOMP/DISCECTOMY FUSION  2001   C 3, C4 and C5 plate and screws  . BREAST BIOPSY Right 1999  . BUNIONECTOMY Left ~ 1977  . CARDIAC CATHETERIZATION     2018 By Dr. Pernell Dupre (done after colon surgery)  . CATARACT EXTRACTION W/ INTRAOCULAR LENS  IMPLANT, BILATERAL  2012  . COLON SURGERY  10/2014  . EYE SURGERY Bilateral    torq lens for cataracts  . LEFT HEART CATH AND CORONARY ANGIOGRAPHY N/A 03/22/2016   Procedure: Left Heart Cath and Coronary Angiography;  Surgeon: Belva Crome, MD;  Location: [facility];  Service: Cardiovascular;  Laterality: N/A;  . LUMBAR LAMINECTOMY/DECOMPRESSION MICRODISCECTOMY N/A 01/20/2018   Procedure: L4-5 decompression;  Surgeon: Marybelle Killings, MD;  Location: [facility];  Service: Orthopedics;  Laterality: N/A;  . THYROIDECTOMY, PARTIAL  1960's  . VAGINAL MASS EXCISION  1994   "Laser surgery for vaginal cancer; followed by chemotherapy" (06/27/2012)    No current facility-administered medications for this encounter.   Current Outpatient Medications  Medication Sig Dispense Refill Last Dose  . acetaminophen (TYLENOL) 500 MG tablet Take 500 mg by mouth every 6 (six)  hours as needed for mild pain.      [name] albuterol (VENTOLIN HFA) 108 (90 Base) MCG/ACT inhaler Inhale 2 puffs into the lungs every 6 (six) hours as needed for wheezing or shortness of breath. 18 g 11   . budesonide-formoterol (SYMBICORT) 80-4.5 MCG/ACT inhaler Inhale 2 puffs into the lungs 2 (two) times daily. 6.9 g 11   . BYSTOLIC 10 MG tablet TAKE 1 TABLET(10 MG) BY MOUTH DAILY (Patient taking differently: Take 10 mg by mouth daily. ) 90 tablet 2   . Calcium-Magnesium-Zinc (CAL-MAG-ZINC PO) Take 1 tablet by mouth daily.     . cetirizine (ZYRTEC) 10 MG tablet TAKE 1 TABLET(10 MG) BY MOUTH DAILY (Patient taking differently: Take 10 mg by mouth daily. ) 90 tablet 3   . cholecalciferol (VITAMIN D) 1000 UNITS tablet Take 1,000 Units by mouth every morning.      . cholestyramine (QUESTRAN) 4 g packet Take 1 packet (4 g total) by mouth daily. 30 packet 11   .  Evolocumab (REPATHA SURECLICK) 672 MG/ML SOAJ Inject 1 pen into the skin every 14 (fourteen) days. 2 pen 11   . hydrocortisone 2.5 % cream Apply topically 2 (two) times daily. 30 g 0   . hydroxychloroquine (PLAQUENIL) 200 MG tablet Take 400 mg by mouth daily.      . isosorbide mononitrate (IMDUR) 60 MG 24 hr tablet TAKE 1 TABLET(60 MG) BY MOUTH DAILY (Patient taking differently: Take 60 mg by mouth daily. ) 90 tablet 3   . losartan (COZAAR) 25 MG tablet Take 1 tablet (25 mg total) by mouth daily. 90 tablet 3   . metFORMIN (GLUCOPHAGE-XR) 500 MG 24 hr tablet TAKE 1 TABLET(500 MG) BY MOUTH DAILY WITH BREAKFAST (Patient taking differently: Take 500 mg by mouth daily with breakfast. ) 90 tablet 1   . Polyethyl Glycol-Propyl Glycol (SYSTANE) 0.4-0.3 % GEL Place 1 application into both eyes daily as needed (for dry eyes).      . Potassium 99 MG TABS Take 99 mg by mouth daily.      . pregabalin (LYRICA) 75 MG capsule TAKE 1 CAPSULE(75 MG) BY MOUTH AT BEDTIME (Patient taking differently: Take 75 mg by mouth at bedtime. ) 30 capsule 0   . torsemide (DEMADEX)  20 MG tablet TAKE 1 TABLET(20 MG) BY MOUTH DAILY (Patient taking differently: Take 20 mg by mouth daily. ) 90 tablet 3   . clopidogrel (PLAVIX) 75 MG tablet TAKE 1 TABLET(75 MG) BY MOUTH DAILY (Patient not taking: Reported on 11/27/2019) 90 tablet 1 Not Taking at Unknown time   Allergies  Allergen Reactions  . Bactrim [Sulfamethoxazole-Trimethoprim] Other (See Comments)    See OV 09-15-13, rash-tongue swelling due to bactrim ?  [name] Cefuroxime Axetil Hives  . Oxycodone Nausea And Vomiting  . Pravastatin Other (See Comments)    "muscle breakdown" with profuse sweating  . Seldane [Terfenadine] Hives  . Zocor [Simvastatin] Other (See Comments)    2012 "Muscle breakdown " with profuse sweating  . Tramadol Nausea And Vomiting  . Atorvastatin Other (See Comments)    Pt reports muscle aches and joint pains  . Lime Flavor [Flavoring Agent] Diarrhea  . Rosuvastatin Other (See Comments)    Pt reports "myalgias, fatigue, nosebleeds."  . Tape Other (See Comments)    blisters    Social History   Tobacco Use  . Smoking status: Former Smoker    Packs/day: 0.25    Years: 5.00    Pack years: 1.25    Types: Cigarettes    Quit date: 01/29/1998    Years since quitting: 21.8  . Smokeless tobacco: Never Used  . Tobacco comment: Quit in 2001  Substance Use Topics  . Alcohol use: No    Alcohol/week: 0.0 standard drinks    Family History  Problem Relation Age of Onset  . Heart disease Father   . Heart disease Mother   . Lung cancer Mother   . Allergies Sister   . Parkinsonism Sister        possible  . Asthma Sister   . Asthma Paternal Grandmother   . Stroke Paternal Grandmother   . Heart disease Other        paternal grandparents, maternal grandparents,   . Heart disease Brother   . Emphysema Brother   . Aneurysm Brother        x3  . Kidney failure Brother   . Diabetes Brother   . Diabetes Brother   . Stroke Brother   . Stroke Maternal Grandmother   .  Breast cancer Neg Hx   . Colon  cancer Neg Hx   . Heart attack Neg Hx   . Esophageal cancer Neg Hx   . Rectal cancer Neg Hx   . Stomach cancer Neg Hx      Review of Systems  Constitutional: Positive for activity change.  HENT: Negative.   Gastrointestinal: Negative.   Musculoskeletal: Positive for gait problem and joint swelling.  Psychiatric/Behavioral: Negative.     Objective:  Physical Exam HENT:     Head: Normocephalic and atraumatic.     Mouth/Throat:     Mouth: Mucous membranes are dry.  Eyes:     Extraocular Movements: Extraocular movements intact.     Pupils: Pupils are equal, round, and reactive to light.  Cardiovascular:     Rate and Rhythm: Regular rhythm.  Pulmonary:     Effort: Pulmonary effort is normal. No respiratory distress.  Musculoskeletal:        General: Tenderness present.  Skin:    General: Skin is warm and dry.  Neurological:     General: No focal deficit present.     Mental Status: She is alert and oriented to person, place, and time.  Psychiatric:        Mood and Affect: Mood normal.     Vital signs in last 24 hours: Temp:  [98.5 F (36.9 C)] 98.5 F (36.9 C) (10/29 1120) Pulse Rate:  [74] 74 (10/29 1120) Resp:  [18] 18 (10/29 1120) BP: (128)/(83) 128/83 (10/29 1120) SpO2:  [99 %] 99 % (10/29 1120) Weight:  [88.5 kg] 88.5 kg (10/29 1120)  Labs:   Estimated body mass index is 35.67 kg/m as calculated from the following:   Height as of 11/27/19: 5\' 2"  (1.575 m).   Weight as of 11/27/19: 88.5 kg.   Imaging Review Plain radiographs demonstrate moderate degenerative joint disease of the right knee(s). The overall alignment ismild varus. The bone quality appears to be good for age and reported activity level.      Assessment/Plan:  End stage arthritis, right knee   The patient history, physical examination, clinical judgment of the provider and imaging studies are consistent with end stage degenerative joint disease of the right knee(s) and total knee  arthroplasty is deemed medically necessary. The treatment options including medical management, injection therapy arthroscopy and arthroplasty were discussed at length. The risks and benefits of total knee arthroplasty were presented and reviewed. The risks due to aseptic loosening, infection, stiffness, patella tracking problems, thromboembolic complications and other imponderables were discussed. The patient acknowledged the explanation, agreed to proceed with the plan and consent was signed. Patient is being admitted for inpatient treatment for surgery, pain control, PT, OT, prophylactic antibiotics, VTE prophylaxis, progressive ambulation and ADL's and discharge planning. The patient is planning to be discharged home with home health services    Anticipated LOS equal to or greater than 2 midnights due to - Age 45 and older with one or more of the following:  - Obesity  - Expected need for hospital services (PT, OT, Nursing) required for safe  discharge  - Anticipated need for postoperative skilled nursing care or inpatient rehab  - Active co-morbidities: Coronary Artery Disease and Heart Failure OR   - Unanticipated findings during/Post Surgery: None  - Patient is a high risk of re-admission due to: None

## 2019-11-30 ENCOUNTER — Other Ambulatory Visit: Payer: Self-pay | Admitting: Internal Medicine

## 2019-11-30 NOTE — Progress Notes (Signed)
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 Last seen 11/11/2019 for preop clearance. Per note, "Preoperative clearance before undergoing right total knee arthroplasty by Dr. Rodell Perna 12/04/2019.  Patient is cardiac risk index is elevated because of history of TIA, CAD.  Her METs are 4 but she is unable to do a whole lot because of her knee and her shortness of breath.  She has chronic dyspnea on exertion with little activity and complains of chronic lower extremity edema.  We will update echo and Lexiscan prior to surgical clearance.  Plavix is managed by Dr. Quay Burow for history of TIA and I believe she is already addressed this. According to the Revised Cardiac Risk Index (RCRI), her Perioperative Risk of Major Cardiac Event is (%): 11." Updated echo 11/13/2019 showed EF 60 to 65%, normal wall motion, grade 1 DD, no significant valvular abnormalities. Nuclear stress 11/18/2019 was low risk. [name], PA-C commented on result stating, "Stress test is normal. No ischemia. Normal heart function. She is cleared for knee surgery."  PCP Dr. Billey Gosling stated patient can hold Plavix 5 days prior to surgery.  History of RA, maintained on Plaquenil.  OSA intolerant to CPAP.  DM2 well-controlled, last A1c 6.7 on 11/04/2019.  Preop labs reviewed, creatinine mildly elevated at 1.34. Otherwise unremarkable.  EKG 11/11/2019: Sinus rhythm. Rate 63. Poor R wave progression. No changes.  Nuclear stress 11/18/2019:  Nuclear stress EF: 63%.  There was no ST segment deviation noted during stress.  No T wave inversion was noted during stress.  The study is normal.  This is a low risk study.  The left ventricular ejection fraction is normal (55-65%).   1.  Normal study without ischemia or infarction.  2. Normal LVEF, 63%.  3. This is a low-risk study.    TTE 11/13/2019: 1. Left ventricular ejection fraction, by estimation, is 60 to 65%. The  left ventricle has normal function. The left ventricle has no regional  wall motion abnormalities. There is mild left ventricular hypertrophy.  Left ventricular diastolic parameters  are consistent with Grade I diastolic dysfunction (impaired relaxation).  GLS -21.1%.  2. Right ventricular systolic function is normal. The right ventricular  size is normal. Tricuspid regurgitation signal is inadequate for assessing  PA pressure.  3. The aortic valve is tricuspid. Aortic valve regurgitation is not  visualized. Mild aortic valve sclerosis is present, with no evidence of  aortic valve stenosis.  4. Aortic dilatation noted. There is mild dilatation of the ascending  aorta, measuring 38 mm.  5. The mitral valve is normal in structure. Trivial mitral valve  regurgitation. No evidence of mitral stenosis.  6. The inferior vena cava is normal in size with greater than 50%  respiratory variability, suggesting right atrial pressure of 3 mmHg.     [facility] Short Stay Center/Anesthesiology Phone [phone] 11/30/2019 4:23 PM

## 2019-11-30 NOTE — Anesthesia Preprocedure Evaluation (Addendum)
Anesthesia Evaluation  Patient identified by MRN, date of birth, ID band Patient awake    Reviewed: Allergy & Precautions, H&P , NPO status , Patient's Chart, lab work & pertinent test results  History of Anesthesia Complications (+) PONV and history of anesthetic complications  Airway Mallampati: II   Neck ROM: full    Dental   Pulmonary asthma , sleep apnea , former smoker,    breath sounds clear to auscultation       Cardiovascular hypertension, + CAD and +CHF   Rhythm:regular Rate:Normal     Neuro/Psych  Headaches,  Neuromuscular disease CVA    GI/Hepatic hiatal hernia, GERD  ,  Endo/Other  diabetes, Type 2  Renal/GU      Musculoskeletal  (+) Arthritis , Fibromyalgia -  Abdominal   Peds  Hematology   Anesthesia Other Findings   Reproductive/Obstetrics                            Anesthesia Physical Anesthesia Plan  ASA: III  Anesthesia Plan: MAC and Spinal   Post-op Pain Management:  Regional for Post-op pain   Induction: Intravenous  PONV Risk Score and Plan: 3 and Ondansetron, Propofol infusion and Treatment may vary due to age or medical condition  Airway Management Planned: Simple Face Mask  Additional Equipment:   Intra-op Plan:   Post-operative Plan:   Informed Consent: I have reviewed the patients History and Physical, chart, labs and discussed the procedure including the risks, benefits and alternatives for the proposed anesthesia with the patient or authorized representative who has indicated his/her understanding and acceptance.       Plan Discussed with: CRNA, Anesthesiologist and Surgeon  Anesthesia Plan Comments: (PAT note by Karoline Caldwell, PA-C:  [facility] for history of CAD moderate nonobstructive disease by cardiac CT 2016, cardiac cath 2018-40% mid RCA 50% mid LAD treated medically,Chronic diastolic CHF,Hypertension,HLD,DM type II,Palpitations  secondary to symptomatic PVCs,History of TIA on Plavix. Last seen 11/11/2019 for preop clearance. Per note, "Preoperative clearance before undergoing right total knee arthroplasty by Dr. Rodell Perna 12/04/2019.  Patient is cardiac risk index is elevated because of history of TIA, CAD.  Her METs are 4 but she is unable to do a whole lot because of her knee and her shortness of breath.  She has chronic dyspnea on exertion with little activity and complains of chronic lower extremity edema.  We will update echo and Lexiscan prior to surgical clearance.  Plavix is managed by Dr. Quay Burow for history of TIA and I believe she is already addressed this. According to the Revised Cardiac Risk Index (RCRI), her Perioperative Risk of Major Cardiac Event is (%): 11." Updated echo 11/13/2019 showed EF 60 to 65%, normal wall motion, grade 1 DD, no significant valvular abnormalities. Nuclear stress 11/18/2019 was low risk. Samantha Barrios, PA-C commented on result stating, "Stress test is normal. No ischemia. Normal heart function. She is cleared for knee surgery."  PCP Dr. Billey Gosling stated patient can hold Plavix 5 days prior to surgery.  History of RA, maintained on Plaquenil.  OSA intolerant to CPAP.  DM2 well-controlled, last A1c 6.7 on 11/04/2019.  Preop labs reviewed, creatinine mildly elevated at 1.34. Otherwise unremarkable.  EKG 11/11/2019: Sinus rhythm. Rate 63. Poor R wave progression. No changes.  Nuclear stress 11/18/2019: Nuclear stress EF: 63%. There was no ST segment deviation noted during stress. No T wave inversion was noted during stress. The study is normal. This  is a low risk study. The left ventricular ejection fraction is normal (55-65%).   1. Normal study without ischemia or infarction.  2. Normal LVEF, 63%.  3. This is a low-risk study.    TTE 11/13/2019: 1. Left ventricular ejection fraction, by estimation, is 60 to 65%. The  left ventricle has normal function. The left ventricle  has no regional  wall motion abnormalities. There is mild left ventricular hypertrophy.  Left ventricular diastolic parameters  are consistent with Grade I diastolic dysfunction (impaired relaxation).  GLS -21.1%.  2. Right ventricular systolic function is normal. The right ventricular  size is normal. Tricuspid regurgitation signal is inadequate for assessing  PA pressure.  3. The aortic valve is tricuspid. Aortic valve regurgitation is not  visualized. Mild aortic valve sclerosis is present, with no evidence of  aortic valve stenosis.  4. Aortic dilatation noted. There is mild dilatation of the ascending  aorta, measuring 38 mm.  5. The mitral valve is normal in structure. Trivial mitral valve  regurgitation. No evidence of mitral stenosis.  6. The inferior vena cava is normal in size with greater than 50%  respiratory variability, suggesting right atrial pressure of 3 mmHg.  )       Anesthesia Quick Evaluation

## 2019-12-01 ENCOUNTER — Other Ambulatory Visit (HOSPITAL_COMMUNITY)
Admission: RE | Admit: 2019-12-01 | Discharge: 2019-12-01 | Disposition: A | Discharge status: Home / Self Care | Payer: Medicare Other | Source: Ambulatory Visit | Attending: Orthopaedic Surgery | Admitting: Orthopaedic Surgery

## 2019-12-01 DIAGNOSIS — Z20822 Contact with and (suspected) exposure to covid-19: Secondary | ICD-10-CM | POA: Diagnosis not present

## 2019-12-01 DIAGNOSIS — Z01812 Encounter for preprocedural laboratory examination: Secondary | ICD-10-CM | POA: Insufficient documentation

## 2019-12-01 LAB — SARS CORONAVIRUS 2 (TAT 6-24 HRS): SARS Coronavirus 2: NEGATIVE

## 2019-12-02 ENCOUNTER — Telehealth: Payer: Self-pay | Admitting: *Deleted

## 2019-12-02 NOTE — Telephone Encounter (Signed)
Ortho bundle pre-op call completed. 

## 2019-12-02 NOTE — Care Plan (Signed)
RNCM call to patient and discussed her upcoming Right total knee arthroplasty with Dr. Lorin Mercy. She is an Ortho bundle patient through [facility] and is agreeable to case management. She has a daughter and sister that will be assisting after surgery. She has a home ramp and no stairs. She will need a FWW and 3in1/BSC. Will order through [employer] for delivery to her room prior to discharge. Anticipate HHPT will be needed after surgery. Choice provided and referral made to [facility]. She is agreeable to OPPT to begin around 2 weeks post-op; to be scheduled at [facility] (Dr. Lorin Mercy' office). CM will make this appointment. Will continue to follow for needs.

## 2019-12-04 ENCOUNTER — Ambulatory Visit (HOSPITAL_COMMUNITY): Payer: Medicare Other | Admitting: Anesthesiology

## 2019-12-04 ENCOUNTER — Inpatient Hospital Stay (HOSPITAL_COMMUNITY)
Admission: RE | Admit: 2019-12-04 | Discharge: 2019-12-06 | DRG: 470 | Disposition: A | Discharge status: Home Health | Payer: Medicare Other | Attending: Orthopaedic Surgery | Admitting: Orthopaedic Surgery

## 2019-12-04 ENCOUNTER — Ambulatory Visit (HOSPITAL_COMMUNITY): Payer: Medicare Other | Admitting: Physician Assistant

## 2019-12-04 ENCOUNTER — Encounter (HOSPITAL_COMMUNITY)
Admission: RE | Disposition: A | Discharge status: Home Health | Payer: Self-pay | Source: Home / Self Care | Attending: Orthopaedic Surgery

## 2019-12-04 ENCOUNTER — Other Ambulatory Visit: Payer: Self-pay

## 2019-12-04 ENCOUNTER — Encounter (HOSPITAL_COMMUNITY): Payer: Self-pay | Admitting: Orthopaedic Surgery

## 2019-12-04 DIAGNOSIS — E669 Obesity, unspecified: Secondary | ICD-10-CM | POA: Diagnosis present

## 2019-12-04 DIAGNOSIS — Z96651 Presence of right artificial knee joint: Secondary | ICD-10-CM

## 2019-12-04 DIAGNOSIS — Z79899 Other long term (current) drug therapy: Secondary | ICD-10-CM

## 2019-12-04 DIAGNOSIS — I110 Hypertensive heart disease with heart failure: Secondary | ICD-10-CM | POA: Diagnosis present

## 2019-12-04 DIAGNOSIS — I5032 Chronic diastolic (congestive) heart failure: Secondary | ICD-10-CM | POA: Diagnosis present

## 2019-12-04 DIAGNOSIS — G8918 Other acute postprocedural pain: Secondary | ICD-10-CM | POA: Diagnosis not present

## 2019-12-04 DIAGNOSIS — Z791 Long term (current) use of non-steroidal anti-inflammatories (NSAID): Secondary | ICD-10-CM

## 2019-12-04 DIAGNOSIS — Z8673 Personal history of transient ischemic attack (TIA), and cerebral infarction without residual deficits: Secondary | ICD-10-CM

## 2019-12-04 DIAGNOSIS — G43109 Migraine with aura, not intractable, without status migrainosus: Secondary | ICD-10-CM | POA: Diagnosis not present

## 2019-12-04 DIAGNOSIS — Z841 Family history of disorders of kidney and ureter: Secondary | ICD-10-CM

## 2019-12-04 DIAGNOSIS — K219 Gastro-esophageal reflux disease without esophagitis: Secondary | ICD-10-CM | POA: Diagnosis present

## 2019-12-04 DIAGNOSIS — J45909 Unspecified asthma, uncomplicated: Secondary | ICD-10-CM | POA: Diagnosis not present

## 2019-12-04 DIAGNOSIS — M1711 Unilateral primary osteoarthritis, right knee: Principal | ICD-10-CM | POA: Diagnosis present

## 2019-12-04 DIAGNOSIS — E1140 Type 2 diabetes mellitus with diabetic neuropathy, unspecified: Secondary | ICD-10-CM | POA: Diagnosis not present

## 2019-12-04 DIAGNOSIS — Z8249 Family history of ischemic heart disease and other diseases of the circulatory system: Secondary | ICD-10-CM

## 2019-12-04 DIAGNOSIS — Z881 Allergy status to other antibiotic agents status: Secondary | ICD-10-CM

## 2019-12-04 DIAGNOSIS — E785 Hyperlipidemia, unspecified: Secondary | ICD-10-CM | POA: Diagnosis not present

## 2019-12-04 DIAGNOSIS — I700 Atherosclerosis of aorta: Secondary | ICD-10-CM | POA: Diagnosis not present

## 2019-12-04 DIAGNOSIS — Z91018 Allergy to other foods: Secondary | ICD-10-CM

## 2019-12-04 DIAGNOSIS — G4733 Obstructive sleep apnea (adult) (pediatric): Secondary | ICD-10-CM | POA: Diagnosis present

## 2019-12-04 DIAGNOSIS — Z87891 Personal history of nicotine dependence: Secondary | ICD-10-CM

## 2019-12-04 DIAGNOSIS — Z7951 Long term (current) use of inhaled steroids: Secondary | ICD-10-CM

## 2019-12-04 DIAGNOSIS — I2583 Coronary atherosclerosis due to lipid rich plaque: Secondary | ICD-10-CM | POA: Diagnosis present

## 2019-12-04 DIAGNOSIS — I7300 Raynaud's syndrome without gangrene: Secondary | ICD-10-CM | POA: Diagnosis present

## 2019-12-04 DIAGNOSIS — Z820 Family history of epilepsy and other diseases of the nervous system: Secondary | ICD-10-CM

## 2019-12-04 DIAGNOSIS — Z888 Allergy status to other drugs, medicaments and biological substances status: Secondary | ICD-10-CM

## 2019-12-04 DIAGNOSIS — Z6835 Body mass index (BMI) 35.0-35.9, adult: Secondary | ICD-10-CM

## 2019-12-04 DIAGNOSIS — M797 Fibromyalgia: Secondary | ICD-10-CM | POA: Diagnosis not present

## 2019-12-04 DIAGNOSIS — Z9049 Acquired absence of other specified parts of digestive tract: Secondary | ICD-10-CM

## 2019-12-04 DIAGNOSIS — Z825 Family history of asthma and other chronic lower respiratory diseases: Secondary | ICD-10-CM

## 2019-12-04 DIAGNOSIS — Z7984 Long term (current) use of oral hypoglycemic drugs: Secondary | ICD-10-CM

## 2019-12-04 DIAGNOSIS — Z823 Family history of stroke: Secondary | ICD-10-CM

## 2019-12-04 DIAGNOSIS — Z833 Family history of diabetes mellitus: Secondary | ICD-10-CM

## 2019-12-04 DIAGNOSIS — I4891 Unspecified atrial fibrillation: Secondary | ICD-10-CM | POA: Diagnosis not present

## 2019-12-04 DIAGNOSIS — Z885 Allergy status to narcotic agent status: Secondary | ICD-10-CM

## 2019-12-04 DIAGNOSIS — Z801 Family history of malignant neoplasm of trachea, bronchus and lung: Secondary | ICD-10-CM

## 2019-12-04 DIAGNOSIS — Z7902 Long term (current) use of antithrombotics/antiplatelets: Secondary | ICD-10-CM

## 2019-12-04 DIAGNOSIS — Z85038 Personal history of other malignant neoplasm of large intestine: Secondary | ICD-10-CM

## 2019-12-04 DIAGNOSIS — M810 Age-related osteoporosis without current pathological fracture: Secondary | ICD-10-CM | POA: Diagnosis present

## 2019-12-04 HISTORY — PX: TOTAL KNEE ARTHROPLASTY: SHX125

## 2019-12-04 LAB — GLUCOSE, CAPILLARY
Glucose-Capillary: 103 mg/dL — ABNORMAL HIGH (ref 70–99)
Glucose-Capillary: 110 mg/dL — ABNORMAL HIGH (ref 70–99)

## 2019-12-04 SURGERY — ARTHROPLASTY, KNEE, TOTAL
Anesthesia: Monitor Anesthesia Care | Site: Knee | Laterality: Right

## 2019-12-04 MED ORDER — MOMETASONE FURO-FORMOTEROL FUM 100-5 MCG/ACT IN AERO
2.0000 | INHALATION_SPRAY | Freq: Two times a day (BID) | RESPIRATORY_TRACT | Status: DC
  Administered 2019-12-05 – 2019-12-06 (×3): 2 via RESPIRATORY_TRACT
  Filled 2019-12-04: qty 8.8

## 2019-12-04 MED ORDER — ONDANSETRON HCL 4 MG/2ML IJ SOLN
INTRAMUSCULAR | Status: DC | PRN
  Administered 2019-12-04: 4 mg via INTRAVENOUS

## 2019-12-04 MED ORDER — VANCOMYCIN HCL 1000 MG IV SOLR
INTRAVENOUS | Status: DC | PRN
  Administered 2019-12-04: 1000 mg via INTRAVENOUS

## 2019-12-04 MED ORDER — ONDANSETRON HCL 4 MG/2ML IJ SOLN: 4.0000 mg | Freq: Four times a day (QID) | INTRAMUSCULAR | Status: DC | PRN

## 2019-12-04 MED ORDER — FENTANYL CITRATE (PF) 100 MCG/2ML IJ SOLN
25.0000 ug | INTRAMUSCULAR | Status: DC | PRN
  Administered 2019-12-04 (×4): 25 ug via INTRAVENOUS

## 2019-12-04 MED ORDER — POLYETHYL GLYCOL-PROPYL GLYCOL 0.4-0.3 % OP GEL
1.0000 "application " | Freq: Every day | OPHTHALMIC | Status: DC | PRN
  Filled 2019-12-04: qty 10

## 2019-12-04 MED ORDER — TRANEXAMIC ACID-NACL 1000-0.7 MG/100ML-% IV SOLN
INTRAVENOUS | Status: AC
  Filled 2019-12-04: qty 100

## 2019-12-04 MED ORDER — SODIUM CHLORIDE 0.9 % IR SOLN
Status: DC | PRN
  Administered 2019-12-04: 3000 mL

## 2019-12-04 MED ORDER — TORSEMIDE 20 MG PO TABS
20.0000 mg | ORAL_TABLET | Freq: Every day | ORAL | Status: DC
  Administered 2019-12-05 – 2019-12-06 (×2): 20 mg via ORAL
  Filled 2019-12-04 (×2): qty 1

## 2019-12-04 MED ORDER — SODIUM CHLORIDE 0.9 % IV SOLN: INTRAVENOUS | Status: DC

## 2019-12-04 MED ORDER — ONDANSETRON HCL 4 MG PO TABS
4.0000 mg | ORAL_TABLET | Freq: Four times a day (QID) | ORAL | Status: DC | PRN
  Administered 2019-12-06: 4 mg via ORAL
  Filled 2019-12-04: qty 1

## 2019-12-04 MED ORDER — METOCLOPRAMIDE HCL 5 MG/ML IJ SOLN: 5.0000 mg | Freq: Three times a day (TID) | INTRAMUSCULAR | Status: DC | PRN

## 2019-12-04 MED ORDER — CLOPIDOGREL BISULFATE 75 MG PO TABS
75.0000 mg | ORAL_TABLET | Freq: Every day | ORAL | Status: DC
  Administered 2019-12-05 – 2019-12-06 (×2): 75 mg via ORAL
  Filled 2019-12-04 (×2): qty 1

## 2019-12-04 MED ORDER — LACTATED RINGERS IV SOLN: INTRAVENOUS | Status: DC

## 2019-12-04 MED ORDER — PROPOFOL 10 MG/ML IV BOLUS
INTRAVENOUS | Status: AC
  Filled 2019-12-04: qty 40

## 2019-12-04 MED ORDER — ROPIVACAINE HCL 5 MG/ML IJ SOLN: INTRAMUSCULAR | Status: DC | PRN

## 2019-12-04 MED ORDER — POTASSIUM 99 MG PO TABS: 99.0000 mg | ORAL_TABLET | Freq: Every day | ORAL | Status: DC

## 2019-12-04 MED ORDER — PROPOFOL 500 MG/50ML IV EMUL
INTRAVENOUS | Status: DC | PRN
  Administered 2019-12-04: 100 ug/kg/min via INTRAVENOUS

## 2019-12-04 MED ORDER — FENTANYL CITRATE (PF) 100 MCG/2ML IJ SOLN
INTRAMUSCULAR | Status: DC | PRN
  Administered 2019-12-04 (×2): 50 ug via INTRAVENOUS

## 2019-12-04 MED ORDER — DIPHENHYDRAMINE HCL 12.5 MG/5ML PO ELIX: 12.5000 mg | ORAL_SOLUTION | ORAL | Status: DC | PRN

## 2019-12-04 MED ORDER — VITAMIN D 25 MCG (1000 UNIT) PO TABS
1000.0000 [IU] | ORAL_TABLET | Freq: Every morning | ORAL | Status: DC
  Administered 2019-12-05 – 2019-12-06 (×2): 1000 [IU] via ORAL
  Filled 2019-12-04 (×2): qty 1

## 2019-12-04 MED ORDER — PREGABALIN 75 MG PO CAPS
75.0000 mg | ORAL_CAPSULE | Freq: Every day | ORAL | Status: DC
  Administered 2019-12-04 – 2019-12-05 (×2): 75 mg via ORAL
  Filled 2019-12-04 (×2): qty 1

## 2019-12-04 MED ORDER — LORATADINE 10 MG PO TABS
10.0000 mg | ORAL_TABLET | Freq: Every day | ORAL | Status: DC
  Administered 2019-12-05 – 2019-12-06 (×2): 10 mg via ORAL
  Filled 2019-12-04 (×2): qty 1

## 2019-12-04 MED ORDER — FENTANYL CITRATE (PF) 100 MCG/2ML IJ SOLN
INTRAMUSCULAR | Status: AC
  Filled 2019-12-04: qty 2

## 2019-12-04 MED ORDER — EVOLOCUMAB 140 MG/ML ~~LOC~~ SOAJ: 1.0000 "pen " | SUBCUTANEOUS | Status: DC

## 2019-12-04 MED ORDER — ACETAMINOPHEN 325 MG PO TABS: 325.0000 mg | ORAL_TABLET | Freq: Four times a day (QID) | ORAL | Status: DC | PRN

## 2019-12-04 MED ORDER — ROPIVACAINE HCL 5 MG/ML IJ SOLN
INTRAMUSCULAR | Status: DC | PRN
  Administered 2019-12-04: 20 mL via PERINEURAL

## 2019-12-04 MED ORDER — OXYCODONE HCL 5 MG/5ML PO SOLN: 5.0000 mg | Freq: Once | ORAL | Status: DC | PRN

## 2019-12-04 MED ORDER — HYDROXYCHLOROQUINE SULFATE 200 MG PO TABS
400.0000 mg | ORAL_TABLET | Freq: Every day | ORAL | Status: DC
  Administered 2019-12-05 – 2019-12-06 (×2): 400 mg via ORAL
  Filled 2019-12-04 (×3): qty 2

## 2019-12-04 MED ORDER — ALBUTEROL SULFATE HFA 108 (90 BASE) MCG/ACT IN AERS
2.0000 | INHALATION_SPRAY | Freq: Four times a day (QID) | RESPIRATORY_TRACT | Status: DC | PRN
  Administered 2019-12-05: 2 via RESPIRATORY_TRACT
  Filled 2019-12-04: qty 6.7

## 2019-12-04 MED ORDER — DOCUSATE SODIUM 100 MG PO CAPS
100.0000 mg | ORAL_CAPSULE | Freq: Two times a day (BID) | ORAL | Status: DC
  Administered 2019-12-04 – 2019-12-06 (×4): 100 mg via ORAL
  Filled 2019-12-04 (×4): qty 1

## 2019-12-04 MED ORDER — CHLORHEXIDINE GLUCONATE 0.12 % MT SOLN
15.0000 mL | Freq: Once | OROMUCOSAL | Status: AC
  Administered 2019-12-04: 15 mL via OROMUCOSAL

## 2019-12-04 MED ORDER — CHOLESTYRAMINE 4 G PO PACK
4.0000 g | PACK | Freq: Every day | ORAL | Status: DC
  Administered 2019-12-05 – 2019-12-06 (×2): 4 g via ORAL
  Filled 2019-12-04 (×3): qty 1

## 2019-12-04 MED ORDER — OXYCODONE HCL 5 MG PO TABS: 5.0000 mg | ORAL_TABLET | Freq: Once | ORAL | Status: DC | PRN

## 2019-12-04 MED ORDER — BUPIVACAINE HCL (PF) 0.25 % IJ SOLN
INTRAMUSCULAR | Status: DC | PRN
  Administered 2019-12-04: 20 mL

## 2019-12-04 MED ORDER — NEBIVOLOL HCL 10 MG PO TABS
10.0000 mg | ORAL_TABLET | Freq: Every day | ORAL | Status: DC
  Administered 2019-12-05 – 2019-12-06 (×2): 10 mg via ORAL
  Filled 2019-12-04 (×2): qty 1

## 2019-12-04 MED ORDER — MIDAZOLAM HCL 2 MG/2ML IJ SOLN
INTRAMUSCULAR | Status: AC
  Filled 2019-12-04: qty 2

## 2019-12-04 MED ORDER — BUPIVACAINE IN DEXTROSE 0.75-8.25 % IT SOLN
INTRATHECAL | Status: DC | PRN
  Administered 2019-12-04: 1.6 mL via INTRATHECAL

## 2019-12-04 MED ORDER — PHENYLEPHRINE HCL-NACL 10-0.9 MG/250ML-% IV SOLN
INTRAVENOUS | Status: DC | PRN
  Administered 2019-12-04: 20 ug/min via INTRAVENOUS

## 2019-12-04 MED ORDER — MORPHINE SULFATE (PF) 2 MG/ML IV SOLN
0.5000 mg | INTRAVENOUS | Status: DC | PRN
  Administered 2019-12-04 – 2019-12-05 (×5): 1 mg via INTRAVENOUS
  Filled 2019-12-04 (×5): qty 1

## 2019-12-04 MED ORDER — BUPIVACAINE LIPOSOME 1.3 % IJ SUSP
20.0000 mL | Freq: Once | INTRAMUSCULAR | Status: DC
  Filled 2019-12-04: qty 20

## 2019-12-04 MED ORDER — VANCOMYCIN HCL IN DEXTROSE 1-5 GM/200ML-% IV SOLN
1000.0000 mg | INTRAVENOUS | Status: DC
  Filled 2019-12-04: qty 200

## 2019-12-04 MED ORDER — PHENOL 1.4 % MT LIQD: 1.0000 | OROMUCOSAL | Status: DC | PRN

## 2019-12-04 MED ORDER — HYDROCODONE-ACETAMINOPHEN 5-325 MG PO TABS
1.0000 | ORAL_TABLET | ORAL | Status: DC | PRN
  Administered 2019-12-04 – 2019-12-06 (×4): 2 via ORAL
  Filled 2019-12-04 (×4): qty 2

## 2019-12-04 MED ORDER — FENTANYL CITRATE (PF) 250 MCG/5ML IJ SOLN
INTRAMUSCULAR | Status: AC
  Filled 2019-12-04: qty 5

## 2019-12-04 MED ORDER — LOSARTAN POTASSIUM 50 MG PO TABS
25.0000 mg | ORAL_TABLET | Freq: Every day | ORAL | Status: DC
  Administered 2019-12-04 – 2019-12-06 (×3): 25 mg via ORAL
  Filled 2019-12-04 (×3): qty 1

## 2019-12-04 MED ORDER — HYDROCODONE-ACETAMINOPHEN 7.5-325 MG PO TABS
1.0000 | ORAL_TABLET | ORAL | Status: DC | PRN
  Administered 2019-12-04: 2 via ORAL
  Administered 2019-12-05: 1 via ORAL
  Administered 2019-12-05 (×2): 2 via ORAL
  Filled 2019-12-04 (×3): qty 2
  Filled 2019-12-04: qty 1

## 2019-12-04 MED ORDER — HYDROCODONE-ACETAMINOPHEN 5-325 MG PO TABS
ORAL_TABLET | ORAL | Status: AC
  Filled 2019-12-04: qty 2

## 2019-12-04 MED ORDER — ORAL CARE MOUTH RINSE: 15.0000 mL | Freq: Once | OROMUCOSAL | Status: AC

## 2019-12-04 MED ORDER — METFORMIN HCL ER 500 MG PO TB24
500.0000 mg | ORAL_TABLET | Freq: Every day | ORAL | Status: DC
  Administered 2019-12-05 – 2019-12-06 (×2): 500 mg via ORAL
  Filled 2019-12-04 (×2): qty 1

## 2019-12-04 MED ORDER — PHENYLEPHRINE HCL (PRESSORS) 10 MG/ML IV SOLN
INTRAVENOUS | Status: DC | PRN
  Administered 2019-12-04: 80 ug via INTRAVENOUS

## 2019-12-04 MED ORDER — ACETAMINOPHEN 500 MG PO TABS
500.0000 mg | ORAL_TABLET | Freq: Four times a day (QID) | ORAL | Status: AC
  Administered 2019-12-05: 500 mg via ORAL
  Filled 2019-12-04: qty 1

## 2019-12-04 MED ORDER — BUPIVACAINE LIPOSOME 1.3 % IJ SUSP
INTRAMUSCULAR | Status: DC | PRN
  Administered 2019-12-04: 20 mL

## 2019-12-04 MED ORDER — METOCLOPRAMIDE HCL 5 MG PO TABS: 5.0000 mg | ORAL_TABLET | Freq: Three times a day (TID) | ORAL | Status: DC | PRN

## 2019-12-04 MED ORDER — BUPIVACAINE HCL (PF) 0.25 % IJ SOLN
INTRAMUSCULAR | Status: AC
  Filled 2019-12-04: qty 30

## 2019-12-04 MED ORDER — FERROUS SULFATE 325 (65 FE) MG PO TABS
325.0000 mg | ORAL_TABLET | Freq: Three times a day (TID) | ORAL | Status: DC
  Administered 2019-12-05 – 2019-12-06 (×5): 325 mg via ORAL
  Filled 2019-12-04 (×5): qty 1

## 2019-12-04 MED ORDER — MENTHOL 3 MG MT LOZG: 1.0000 | LOZENGE | OROMUCOSAL | Status: DC | PRN

## 2019-12-04 MED ORDER — ASPIRIN EC 325 MG PO TBEC
325.0000 mg | DELAYED_RELEASE_TABLET | Freq: Every day | ORAL | Status: DC
  Administered 2019-12-05 – 2019-12-06 (×2): 325 mg via ORAL
  Filled 2019-12-04 (×2): qty 1

## 2019-12-04 MED ORDER — ISOSORBIDE MONONITRATE ER 60 MG PO TB24
60.0000 mg | ORAL_TABLET | Freq: Every day | ORAL | Status: DC
  Administered 2019-12-05 – 2019-12-06 (×2): 60 mg via ORAL
  Filled 2019-12-04 (×2): qty 1

## 2019-12-04 SURGICAL SUPPLY — 83 items
APL SKNCLS STERI-STRIP NONHPOA (GAUZE/BANDAGES/DRESSINGS) ×1
ATTUNE MED DOME PAT 38 KNEE (Knees) ×1 IMPLANT
ATTUNE PS FEM RT SZ 6 CEM KNEE (Femur) ×1 IMPLANT
ATTUNE PSRP INSR SZ6 5 KNEE (Insert) ×1 IMPLANT
BANDAGE ESMARK 6X9 LF (GAUZE/BANDAGES/DRESSINGS) ×1 IMPLANT
BASE TIBIA ATTUNE KNEE SYS SZ6 (Knees) IMPLANT
BENZOIN TINCTURE PRP APPL 2/3 (GAUZE/BANDAGES/DRESSINGS) ×2 IMPLANT
BLADE SAGITTAL 25.0X1.19X90 (BLADE) ×2 IMPLANT
BLADE SAW SGTL 13X75X1.27 (BLADE) ×2 IMPLANT
BNDG CMPR 9X6 STRL LF SNTH (GAUZE/BANDAGES/DRESSINGS) ×1
BNDG ELASTIC 4X5.8 VLCR STR LF (GAUZE/BANDAGES/DRESSINGS) ×2 IMPLANT
BNDG ELASTIC 6X5.8 VLCR STR LF (GAUZE/BANDAGES/DRESSINGS) ×1 IMPLANT
BNDG ESMARK 6X9 LF (GAUZE/BANDAGES/DRESSINGS) ×2
BOWL SMART MIX CTS (DISPOSABLE) ×2 IMPLANT
BSPLAT TIB 6 CMNT ROT PLAT STR (Knees) ×1 IMPLANT
CATH FOLEY LATEX FREE 16FR (CATHETERS) ×2
CATH FOLEY LF 16FR (CATHETERS) IMPLANT
CEMENT HV SMART SET (Cement) ×4 IMPLANT
COVER SURGICAL LIGHT HANDLE (MISCELLANEOUS) ×2 IMPLANT
COVER WAND RF STERILE (DRAPES) ×2 IMPLANT
CUFF TOURN SGL QUICK 34 (TOURNIQUET CUFF) ×2
CUFF TOURN SGL QUICK 42 (TOURNIQUET CUFF) IMPLANT
CUFF TRNQT CYL 34X4.125X (TOURNIQUET CUFF) ×1 IMPLANT
DRAPE ORTHO SPLIT 77X108 STRL (DRAPES) ×4
DRAPE SURG ORHT 6 SPLT 77X108 (DRAPES) ×2 IMPLANT
DRAPE U-SHAPE 47X51 STRL (DRAPES) ×2 IMPLANT
DRSG MEPILEX BORDER 4X12 (GAUZE/BANDAGES/DRESSINGS) ×1 IMPLANT
DRSG PAD ABDOMINAL 8X10 ST (GAUZE/BANDAGES/DRESSINGS) ×2 IMPLANT
DURAPREP 26ML APPLICATOR (WOUND CARE) ×5 IMPLANT
ELECT REM PT RETURN 9FT ADLT (ELECTROSURGICAL) ×2
ELECTRODE REM PT RTRN 9FT ADLT (ELECTROSURGICAL) ×1 IMPLANT
EVACUATOR 1/8 PVC DRAIN (DRAIN) IMPLANT
FACESHIELD WRAPAROUND (MASK) ×4 IMPLANT
FACESHIELD WRAPAROUND OR TEAM (MASK) ×2 IMPLANT
GAUZE SPONGE 4X4 12PLY STRL (GAUZE/BANDAGES/DRESSINGS) ×2 IMPLANT
GAUZE XEROFORM 5X9 LF (GAUZE/BANDAGES/DRESSINGS) ×2 IMPLANT
GLOVE BIO SURGEON STRL SZ 6.5 (GLOVE) ×1 IMPLANT
GLOVE BIO SURGEON STRL SZ7 (GLOVE) ×1 IMPLANT
GLOVE BIO SURGEON STRL SZ7.5 (GLOVE) ×1 IMPLANT
GLOVE BIOGEL PI IND STRL 8 (GLOVE) ×2 IMPLANT
GLOVE BIOGEL PI INDICATOR 8 (GLOVE) ×2
GLOVE ORTHO TXT STRL SZ7.5 (GLOVE) ×4 IMPLANT
GOWN STRL REUS W/ TWL LRG LVL3 (GOWN DISPOSABLE) ×1 IMPLANT
GOWN STRL REUS W/ TWL XL LVL3 (GOWN DISPOSABLE) ×1 IMPLANT
GOWN STRL REUS W/TWL 2XL LVL3 (GOWN DISPOSABLE) ×3 IMPLANT
GOWN STRL REUS W/TWL LRG LVL3 (GOWN DISPOSABLE) ×2
GOWN STRL REUS W/TWL XL LVL3 (GOWN DISPOSABLE) ×2
HANDPIECE INTERPULSE COAX TIP (DISPOSABLE) ×2
IMMOBILIZER KNEE 22 UNIV (SOFTGOODS) ×2 IMPLANT
KIT BASIN OR (CUSTOM PROCEDURE TRAY) ×2 IMPLANT
KIT TURNOVER KIT B (KITS) ×2 IMPLANT
MANIFOLD NEPTUNE II (INSTRUMENTS) ×2 IMPLANT
MARKER SKIN DUAL TIP RULER LAB (MISCELLANEOUS) ×2 IMPLANT
NDL 18GX1X1/2 (RX/OR ONLY) (NEEDLE) ×1 IMPLANT
NDL HYPO 25GX1X1/2 BEV (NEEDLE) ×1 IMPLANT
NEEDLE 18GX1X1/2 (RX/OR ONLY) (NEEDLE) ×2 IMPLANT
NEEDLE HYPO 25GX1X1/2 BEV (NEEDLE) ×2 IMPLANT
NS IRRIG 1000ML POUR BTL (IV SOLUTION) ×2 IMPLANT
PACK TOTAL JOINT (CUSTOM PROCEDURE TRAY) ×2 IMPLANT
PAD ABD 8X10 STRL (GAUZE/BANDAGES/DRESSINGS) ×1 IMPLANT
PAD ARMBOARD 7.5X6 YLW CONV (MISCELLANEOUS) ×4 IMPLANT
PAD CAST 4YDX4 CTTN HI CHSV (CAST SUPPLIES) ×1 IMPLANT
PADDING CAST COTTON 4X4 STRL (CAST SUPPLIES) ×2
PADDING CAST COTTON 6X4 STRL (CAST SUPPLIES) ×2 IMPLANT
PIN DRILL FIX HALF THREAD (BIT) ×1 IMPLANT
PIN STEINMAN FIXATION KNEE (PIN) ×1 IMPLANT
SET HNDPC FAN SPRY TIP SCT (DISPOSABLE) ×1 IMPLANT
STAPLER VISISTAT 35W (STAPLE) IMPLANT
SUCTION FRAZIER HANDLE 10FR (MISCELLANEOUS) ×2
SUCTION TUBE FRAZIER 10FR DISP (MISCELLANEOUS) ×1 IMPLANT
SUT VIC AB 0 CT1 27 (SUTURE) ×2
SUT VIC AB 0 CT1 27XBRD ANBCTR (SUTURE) ×1 IMPLANT
SUT VIC AB 1 CTX 36 (SUTURE) ×4
SUT VIC AB 1 CTX36XBRD ANBCTR (SUTURE) ×2 IMPLANT
SUT VIC AB 2-0 CT1 27 (SUTURE) ×4
SUT VIC AB 2-0 CT1 TAPERPNT 27 (SUTURE) ×2 IMPLANT
SUT VIC AB 3-0 X1 27 (SUTURE) ×2 IMPLANT
SYR 50ML LL SCALE MARK (SYRINGE) ×2 IMPLANT
SYR CONTROL 10ML LL (SYRINGE) ×2 IMPLANT
TIBIA ATTUNE KNEE SYS BASE SZ6 (Knees) ×2 IMPLANT
TOWEL GREEN STERILE (TOWEL DISPOSABLE) ×2 IMPLANT
TOWEL GREEN STERILE FF (TOWEL DISPOSABLE) ×2 IMPLANT
TRAY CATH 16FR W/PLASTIC CATH (SET/KITS/TRAYS/PACK) IMPLANT

## 2019-12-04 NOTE — Progress Notes (Signed)
Orthopedic Tech Progress Note Patient Details:  Samantha [name] 11-19-1940 [id number] Patient already had on knee immobilizer  Patient ID: Samantha [name], female   DOB: 11/14/1940, 79 y.o.   MRN: [id number]   Samantha [name] 12/04/2019, 8:12 PM

## 2019-12-04 NOTE — Care Plan (Signed)
Ortho Bundle Case Management Note  Patient Details  Name: Samantha [name] MRN: [id number] Date of Birth: [birth year]    [facility] call to patient and discussed her upcoming Right total knee arthroplasty with Dr. Lorin Mercy. She is an Ortho bundle patient through [facility] and is agreeable to case management. She has a daughter and sister that will be assisting after surgery. She has a home ramp and no stairs. She will need a FWW and 3in1/BSC. Will order through [employer] [facility] for delivery to her room prior to discharge. Anticipate HHPT will be needed after surgery. Choice provided and referral made to [facility]. She is agreeable to OPPT to begin around 2 weeks post-op; to be scheduled at [facility] (Dr. Lorin Mercy' office). CM will make this appointment. Will continue to follow for needs.                        DME Arranged:  3-N-1, Walker rolling DME Agency:  [employer]  HH Arranged:  PT HH Agency:     Additional Comments: Please contact me with any questions of if this plan should need to change.  Jamse Arn, RN, BSN, [employer]  [phone] 12/04/2019, 11:35 AM

## 2019-12-04 NOTE — Progress Notes (Signed)
Called down to PACU to see pt as pt holding, however, orders not released. Will follow up as schedule allows and as orders released.  Reuel Derby, PT, DPT  Acute Rehabilitation Services  Pager: [phone] Office: [phone]

## 2019-12-04 NOTE — Op Note (Signed)
Preop diagnosis: Right knee primary osteoarthritis with valgus.  Postop diagnosis: Same  Procedure: Right cemented total knee arthroplasty.  Surgeon: Rodell Perna, MD  Assistant: April Fulp, RNFA  Tourniquet: 350 x 54 minutes.  Anesthesia preoperative abductor block plus spinal anesthesia +20 cc Exparel 20 cc Marcaine prior to tourniquet deflation.  EBL less than 100 cc  Implants:Depuy Attune size 6 femur size 6 tibia rotating platform 5 mm thick rotating platform poly-.  38 mm 3 peg dome patella.  Procedure: After induction of spinal anesthesia proximal thigh tourniquet lateral post heel bump DuraPrep the tip of the toes the usual total knee sheets drapes sterile skin marker impervious stockinette Coban and Betadine Steri-Drape sealing the skin was all applied.  Timeout procedure was completed.  Patient was given vancomycin due to her allergies.  Leg was wrapped in Esmarch tourniquet inflated.  Midline incision was made medial parapatellar incision was made.  Bone-on-bone lateral compartment eburnated bone and lateral compartment wear.  Meniscus remnants were resected ACL PCL was resected.  Intramedullary hole was made in the femur after the patella was resected 10 mm from facet to the set.  Initially 10 mm taken off the femur there is barely sky of the lateral compartment due to the valgus knee we took an additional 4 mm.  Tibia was cut additional 2 mm cut on the tibia.  Posterior capsule release with three-quarter curved osteotome.  Sizing for the femur was a 6 chamfer cuts box cuts were made.  Tibia was sized for 6 as well.  Trials were inserted and knee came out to full extension.  Collateral ligaments were balanced.  All meniscus remnants have been resected.  Pulse lavage vacuum mixing of the cement.  Tibia cemented first followed by femur placement of permanent poly and then patella held with a self retaining clamp.  All excessive cement was removed.  While cement was setting up Marcaine  Exparel was injected.  Cement was hardened 15 minutes tourniquet deflated hemostasis obtained.  #1 Vicryl interrupted in quad tendon and medial retinacular closure.  2-0 Vicryl subtendinous tissue skin staple closure postop dressing with knee immobilizer.  Patient tolerated procedure well transfer care room in stable condition.

## 2019-12-04 NOTE — Anesthesia Procedure Notes (Signed)
Spinal  Patient location during procedure: OR Start time: 12/04/2019 7:45 AM End time: 12/04/2019 7:49 AM Staffing Performed: anesthesiologist  Anesthesiologist: Albertha Ghee, MD Preanesthetic Checklist Completed: patient identified, IV checked, risks and benefits discussed, surgical consent, monitors and equipment checked, pre-op evaluation and timeout performed Spinal Block Patient position: sitting Prep: DuraPrep Patient monitoring: cardiac monitor, continuous pulse ox and blood pressure Approach: left paramedian Location: L3-4 Injection technique: single-shot Needle Needle type: Quincke  Needle gauge: 22 G Needle length: 9 cm Assessment Sensory level: T10 Additional Notes Functioning IV was confirmed and monitors were applied. Sterile prep and drape, including hand hygiene and sterile gloves were used. The patient was positioned and the spine was prepped. The skin was anesthetized with lidocaine.  Free flow of clear CSF was obtained prior to injecting local anesthetic into the CSF.  The spinal needle aspirated freely following injection.  The needle was carefully withdrawn.  The patient tolerated the procedure well.

## 2019-12-04 NOTE — Interval H&P Note (Signed)
History and Physical Interval Note:  12/04/2019 7:26 AM  Samantha [name]  has presented today for surgery, with the diagnosis of RIGHT KNEE OSTEOARTHRITIS.  The various methods of treatment have been discussed with the patient and family. After consideration of risks, benefits and other options for treatment, the patient has consented to  Procedure(s) with comments: RIGHT TOTAL KNEE ARTHROPLASTY (Right) - RNFA APRIL PLEASE as a surgical intervention.  The patient's history has been reviewed, patient examined, no change in status, stable for surgery.  I have reviewed the patient's chart and labs.  Questions were answered to the patient's satisfaction.     Marybelle Killings

## 2019-12-04 NOTE — Progress Notes (Addendum)
PHARMACIST - PHYSICIAN ORDER COMMUNICATION  CONCERNING: P&T Medication Policy on Herbal Medications  DESCRIPTION:  This patient's order for:  Potassium 99mg   has been noted. 99mg  = ~2.53 mEq  This product(s) is classified as an "herbal" or natural product. Due to a lack of definitive safety studies or FDA approval, nonstandard manufacturing practices, plus the potential risk of unknown drug-drug interactions while on inpatient medications, the Pharmacy and Therapeutics Committee does not permit the use of "herbal" or natural products of this type within [facility].   ACTION TAKEN: The pharmacy department is unable to verify this order at this time and your patient has been informed of this safety policy. Please reevaluate patient's clinical condition at discharge and address if the herbal or natural product(s) should be resumed at that time.  Dimple Nanas, PharmD PGY-1 Acute Care Pharmacy Resident 12/04/2019 6:23 PM

## 2019-12-04 NOTE — Anesthesia Procedure Notes (Signed)
Anesthesia Regional Block: Adductor canal block   Pre-Anesthetic Checklist: ,, timeout performed, Correct Patient, Correct Site, Correct Laterality, Correct Procedure, Correct Position, site marked, Risks and benefits discussed,  Surgical consent,  Pre-op evaluation,  At surgeon's request and post-op pain management  Laterality: Right  Prep: chloraprep       Needles:  Injection technique: Single-shot  Needle Type: Echogenic Needle     Needle Length: 9cm  Needle Gauge: 21     Additional Needles:   Narrative:  Start time: 12/04/2019 6:57 AM End time: 12/04/2019 7:03 AM Injection made incrementally with aspirations every 5 mL.  Performed by: Personally  Anesthesiologist: Albertha Ghee, MD  Additional Notes: Pt tolerated the procedure well.

## 2019-12-04 NOTE — Transfer of Care (Signed)
Immediate Anesthesia Transfer of Care Note  Patient: Samantha [name]  Procedure(s) Performed: RIGHT TOTAL KNEE ARTHROPLASTY (Right Knee)  Patient Location: PACU  Anesthesia Type:MAC and Spinal  Level of Consciousness: awake, alert  and patient cooperative  Airway & Oxygen Therapy: Patient Spontanous Breathing  Post-op Assessment: Report given to RN and Post -op Vital signs reviewed and stable  Post vital signs: Reviewed and stable  Last Vitals:  Vitals Value Taken Time  BP 102/63 12/04/19 0945  Temp    Pulse 68 12/04/19 0946  Resp 16 12/04/19 0946  SpO2 99 % 12/04/19 0946  Vitals shown include unvalidated device data.  Last Pain:  Vitals:   12/04/19 0637  TempSrc:   PainSc: 0-No pain         Complications: No complications documented.

## 2019-12-05 DIAGNOSIS — Z85038 Personal history of other malignant neoplasm of large intestine: Secondary | ICD-10-CM | POA: Diagnosis not present

## 2019-12-05 DIAGNOSIS — Z7951 Long term (current) use of inhaled steroids: Secondary | ICD-10-CM | POA: Diagnosis not present

## 2019-12-05 DIAGNOSIS — I7300 Raynaud's syndrome without gangrene: Secondary | ICD-10-CM | POA: Diagnosis present

## 2019-12-05 DIAGNOSIS — M1711 Unilateral primary osteoarthritis, right knee: Secondary | ICD-10-CM | POA: Diagnosis present

## 2019-12-05 DIAGNOSIS — M810 Age-related osteoporosis without current pathological fracture: Secondary | ICD-10-CM | POA: Diagnosis present

## 2019-12-05 DIAGNOSIS — M797 Fibromyalgia: Secondary | ICD-10-CM | POA: Diagnosis present

## 2019-12-05 DIAGNOSIS — E669 Obesity, unspecified: Secondary | ICD-10-CM | POA: Diagnosis present

## 2019-12-05 DIAGNOSIS — Z791 Long term (current) use of non-steroidal anti-inflammatories (NSAID): Secondary | ICD-10-CM | POA: Diagnosis not present

## 2019-12-05 DIAGNOSIS — E1140 Type 2 diabetes mellitus with diabetic neuropathy, unspecified: Secondary | ICD-10-CM | POA: Diagnosis present

## 2019-12-05 DIAGNOSIS — I4891 Unspecified atrial fibrillation: Secondary | ICD-10-CM | POA: Diagnosis present

## 2019-12-05 DIAGNOSIS — Z6835 Body mass index (BMI) 35.0-35.9, adult: Secondary | ICD-10-CM | POA: Diagnosis not present

## 2019-12-05 DIAGNOSIS — E785 Hyperlipidemia, unspecified: Secondary | ICD-10-CM | POA: Diagnosis present

## 2019-12-05 DIAGNOSIS — Z7902 Long term (current) use of antithrombotics/antiplatelets: Secondary | ICD-10-CM | POA: Diagnosis not present

## 2019-12-05 DIAGNOSIS — Z7984 Long term (current) use of oral hypoglycemic drugs: Secondary | ICD-10-CM | POA: Diagnosis not present

## 2019-12-05 DIAGNOSIS — G43109 Migraine with aura, not intractable, without status migrainosus: Secondary | ICD-10-CM | POA: Diagnosis present

## 2019-12-05 DIAGNOSIS — I5032 Chronic diastolic (congestive) heart failure: Secondary | ICD-10-CM | POA: Diagnosis present

## 2019-12-05 DIAGNOSIS — I700 Atherosclerosis of aorta: Secondary | ICD-10-CM | POA: Diagnosis present

## 2019-12-05 DIAGNOSIS — G4733 Obstructive sleep apnea (adult) (pediatric): Secondary | ICD-10-CM | POA: Diagnosis present

## 2019-12-05 DIAGNOSIS — J45909 Unspecified asthma, uncomplicated: Secondary | ICD-10-CM | POA: Diagnosis present

## 2019-12-05 DIAGNOSIS — I110 Hypertensive heart disease with heart failure: Secondary | ICD-10-CM | POA: Diagnosis present

## 2019-12-05 DIAGNOSIS — K219 Gastro-esophageal reflux disease without esophagitis: Secondary | ICD-10-CM | POA: Diagnosis present

## 2019-12-05 DIAGNOSIS — I2583 Coronary atherosclerosis due to lipid rich plaque: Secondary | ICD-10-CM | POA: Diagnosis present

## 2019-12-05 DIAGNOSIS — Z8673 Personal history of transient ischemic attack (TIA), and cerebral infarction without residual deficits: Secondary | ICD-10-CM | POA: Diagnosis not present

## 2019-12-05 DIAGNOSIS — Z9049 Acquired absence of other specified parts of digestive tract: Secondary | ICD-10-CM | POA: Diagnosis not present

## 2019-12-05 LAB — CBC
HCT: 29.9 % — ABNORMAL LOW (ref 36.0–46.0)
Hemoglobin: 9.9 g/dL — ABNORMAL LOW (ref 12.0–15.0)
MCH: 28.4 pg (ref 26.0–34.0)
MCHC: 33.1 g/dL (ref 30.0–36.0)
MCV: 85.9 fL (ref 80.0–100.0)
Platelets: 127 10*3/uL — ABNORMAL LOW (ref 150–400)
RBC: 3.48 MIL/uL — ABNORMAL LOW (ref 3.87–5.11)
RDW: 13.7 % (ref 11.5–15.5)
WBC: 6.4 10*3/uL (ref 4.0–10.5)
nRBC: 0 % (ref 0.0–0.2)

## 2019-12-05 LAB — GLUCOSE, CAPILLARY
Glucose-Capillary: 109 mg/dL — ABNORMAL HIGH (ref 70–99)
Glucose-Capillary: 152 mg/dL — ABNORMAL HIGH (ref 70–99)

## 2019-12-05 NOTE — Progress Notes (Signed)
   Subjective: 1 Day Post-Op Procedure(s) (LRB): RIGHT TOTAL KNEE ARTHROPLASTY (Right) Patient reports pain as moderate.    Objective: Vital signs in last 24 hours: Temp:  [97.5 F (36.4 C)-98.7 F (37.1 C)] 98.7 F (37.1 C) (11/06 0810) Pulse Rate:  [55-90] 86 (11/06 0810) Resp:  [14-21] 17 (11/06 0810) BP: (118-187)/(63-105) 127/63 (11/06 0810) SpO2:  [95 %-100 %] 96 % (11/06 0810)  Intake/Output from previous day: 11/05 0701 - 11/06 0700 In: 2230 [P.O.:630; I.V.:1600] Out: 2250 [Urine:2200; Blood:50] Intake/Output this shift: No intake/output data recorded.  Recent Labs    12/05/19 0513  HGB 9.9*   Recent Labs    12/05/19 0513  WBC 6.4  RBC 3.48*  HCT 29.9*  PLT 127*   No results for input(s): NA, K, CL, CO2, BUN, CREATININE, GLUCOSE, CALCIUM in the last 72 hours. No results for input(s): LABPT, INR in the last 72 hours.  Neurologically intact No results found.  Assessment/Plan: 1 Day Post-Op Procedure(s) (LRB): RIGHT TOTAL KNEE ARTHROPLASTY (Right) Up with therapy. Just walked a few steps in room. Likely Home Sunday. Has not done stairs or walked in the hall  Samantha [name] 12/05/2019, 11:57 AM

## 2019-12-05 NOTE — Progress Notes (Signed)
Orthopedic Tech Progress Note Patient Details:  Samantha [name] [facility] Oct 17, 1940 [id number]  CPM Right Knee CPM Right Knee: On Right Knee Flexion (Degrees): 0 Right Knee Extension (Degrees): 35  Post Interventions Patient Tolerated: Fair Instructions Provided: Care of device, Adjustment of device  Anthonyjames Bargar 12/05/2019, 4:57 PM

## 2019-12-05 NOTE — Progress Notes (Signed)
Physical Therapy Treatment Patient Details Name: Samantha [name] MRN: [id number] DOB: 1940/09/09 Today's Date: 12/05/2019    History of Present Illness 79 y.o. female s/p Rt TKA. Hx of CHF, CAD, DM, VBI, TIA.    PT Comments    Patient is progressing towards physical therapy goals, ambulating up to 60 feet this afternoon, emerging step through pattern. Still requires assistance to rise from elevated bed and BSC but improving. Rt knee still a bit unstable and requires knee immobilizer but demonstrating improved control overall. Needs to practice stair navigation which I feel she will be able to tolerate tomorrow. Continue with PoC.    Follow Up Recommendations  Follow surgeon's recommendation for DC plan and follow-up therapies     Equipment Recommendations  None recommended by PT (already delievered to room)    Recommendations for Other Services OT consult     Precautions / Restrictions Precautions Precautions: Knee Precaution Booklet Issued: Yes (comment) Required Braces or Orthoses: Knee Immobilizer - Right Knee Immobilizer - Right: On when out of bed or walking Restrictions Weight Bearing Restrictions: Yes RLE Weight Bearing: Weight bearing as tolerated    Mobility  Bed Mobility Overal bed mobility: Needs Assistance Bed Mobility: Supine to Sit;Sit to Supine     Supine to sit: Min guard Sit to supine: Min assist   General bed mobility comments: Min guard for supine to sit and min assist for RLE support back into bed, practiced using RLE to assist.  Transfers Overall transfer level: Needs assistance Equipment used: Rolling walker (2 wheeled) Transfers: Sit to/from [facility] Sit to Stand: From elevated surface;Min assist Stand pivot transfers: Min assist;From elevated surface       General transfer comment: Min assist for boost to stand from elevated surface, assist with RW during pivot. Cues for hand placement and walker  control.  Ambulation/Gait Ambulation/Gait assistance: Min assist Gait Distance (Feet): 60 Feet Assistive device: Rolling walker (2 wheeled) Gait Pattern/deviations: Step-to pattern;Decreased stride length;Decreased weight shift to right;Antalgic;Trunk flexed Gait velocity: slow Gait velocity interpretation: <1.31 ft/sec, indicative of household ambulator General Gait Details: Emerging step through pattern. Cues for technique, safety and symmetry. RW placement for proximity min assist. Mild instability still noted with KI in place.   Stairs             Wheelchair Mobility    Modified Rankin (Stroke Patients Only)       Balance Overall balance assessment: Needs assistance Sitting-balance support: No upper extremity supported Sitting balance-Leahy Scale: Fair     Standing balance support: No upper extremity supported;During functional activity Standing balance-Leahy Scale: Fair                              Cognition Arousal/Alertness: Awake/alert Behavior During Therapy: WFL for tasks assessed/performed Overall Cognitive Status: Within Functional Limits for tasks assessed                                        Exercises Total Joint Exercises Ankle Circles/Pumps: AROM;Both;10 reps;Seated Quad Sets: Strengthening;Both;10 reps;Seated Gluteal Sets: Strengthening;Both;10 reps;Seated Heel Slides: Strengthening;Right;AAROM;10 reps Straight Leg Raises: AAROM;Right;Seated;15 reps Long Arc Quad: AAROM;Right;10 reps;Seated Knee Flexion: AROM;Right;5 reps;Seated    General Comments General comments (skin integrity, edema, etc.): Urinary incontinence getting out of bed to [facility].      Pertinent Vitals/Pain Pain Assessment: 0-10 Pain Score: 4  Pain Location: Rt knee Pain Descriptors / Indicators: Constant Pain Intervention(s): Monitored during session;Repositioned;RN gave pain meds during session    Home Living                       Prior Function            PT Goals (current goals can now be found in the care plan section) Acute Rehab PT Goals Patient Stated Goal: Get ewell PT Goal Formulation: With patient Time For Goal Achievement: 12/19/19 Potential to Achieve Goals: Good Progress towards PT goals: Progressing toward goals    Frequency    7X/week      PT Plan Current plan remains appropriate    Co-evaluation              AM-PAC PT "6 Clicks" Mobility   Outcome Measure  Help needed turning from your back to your side while in a flat bed without using bedrails?: A Little Help needed moving from lying on your back to sitting on the side of a flat bed without using bedrails?: A Little Help needed moving to and from a bed to a chair (including a wheelchair)?: A Little Help needed standing up from a chair using your arms (e.g., wheelchair or bedside chair)?: A Lot Help needed to walk in hospital room?: A Little Help needed climbing 3-5 steps with a railing? : A Lot 6 Click Score: 16    End of Session Equipment Utilized During Treatment: Gait belt Activity Tolerance: Patient tolerated treatment well Patient left: in chair;with call bell/phone within reach;with chair alarm set;with family/visitor present;with SCD's reapplied Nurse Communication: Mobility status PT Visit Diagnosis: Other abnormalities of gait and mobility (R26.89);Muscle weakness (generalized) (M62.81);Difficulty in walking, not elsewhere classified (R26.2);Pain Pain - Right/Left: Right Pain - part of body: Knee     Time: 4825-0037 PT Time Calculation (min) (ACUTE ONLY): 24 min  Charges:  $Gait Training: 8-22 mins $Therapeutic Exercise: 8-22 mins                     Elayne Snare, PT, DPT   Ellouise Newer 12/05/2019, 5:46 PM

## 2019-12-05 NOTE — Evaluation (Signed)
Physical Therapy Evaluation Patient Details Name: Samantha [name] MRN: [id number] DOB: 05-15-1940 Today's Date: 12/05/2019   History of Present Illness  79 y.o. female s/p Rt TKA. Hx of CHF, CAD, DM, VBI, TIA.  Clinical Impression  Patient is s/p above surgery resulting in functional limitations due to the deficits listed below (see PT Problem List). Able to ambulate with knee immobilizer in place, several feet using a rolling walker and min assist. Mod assist for transfers. Hesitates to put weight through RLE but improved with training. Reviewed exercises. Daughter present. Patient will benefit from skilled PT to increase their independence and safety with mobility to allow discharge to the venue listed below.       Follow Up Recommendations Follow surgeon's recommendation for DC plan and follow-up therapies    Equipment Recommendations  None recommended by PT (already delievered to room)    Recommendations for Other Services OT consult     Precautions / Restrictions Precautions Precautions: Knee Precaution Booklet Issued: Yes (comment) Required Braces or Orthoses: Knee Immobilizer - Right Knee Immobilizer - Right: On when out of bed or walking Restrictions Weight Bearing Restrictions: Yes RLE Weight Bearing: Weight bearing as tolerated      Mobility  Bed Mobility Overal bed mobility: Needs Assistance Bed Mobility: Supine to Sit     Supine to sit: Min assist     General bed mobility comments: Min assist for RLE support to EOB. Guarded. Cues for technique. Very slow.    Transfers Overall transfer level: Needs assistance Equipment used: Rolling walker (2 wheeled) Transfers: Sit to/from [facility] Sit to Stand: Mod assist;From elevated surface Stand pivot transfers: Mod assist       General transfer comment: Mod assist for boost to stand from elevated bed surface, mod assist for control and balance with pivot to [facility]. Cues for technique hand  placement and safety awareness. Progressed to min assist during second transfer for boost from [facility], and with pivot from [facility].   Ambulation/Gait Ambulation/Gait assistance: Min assist Gait Distance (Feet): 8 Feet Assistive device: Rolling walker (2 wheeled) Gait Pattern/deviations: Step-to pattern;Decreased stride length;Decreased weight shift to right;Antalgic;Trunk flexed Gait velocity: slow Gait velocity interpretation: <1.31 ft/sec, indicative of household ambulator General Gait Details: Educated on safe DME use with RW. Cues for technique and sequencing, proximity of walker. Pre-gait with weight-shift, minimal weight bearing tolerated through RLE. Knee immobilizer in place, some instability noted. Min assist for balance, walker placement.  Stairs            Wheelchair Mobility    Modified Rankin (Stroke Patients Only)       Balance Overall balance assessment: Needs assistance Sitting-balance support: No upper extremity supported Sitting balance-Leahy Scale: Fair     Standing balance support: Single extremity supported Standing balance-Leahy Scale: Poor                               Pertinent Vitals/Pain Pain Assessment: 0-10 Pain Score: 10-Worst pain ever Pain Location: Rt knee Pain Descriptors / Indicators: Constant Pain Intervention(s): Limited activity within patient's tolerance;Monitored during session;Premedicated before session;Repositioned    Home Living Family/patient expects to be discharged to:: Unsure Living Arrangements: Children Available Help at Discharge: Family;Available 24 hours/day;Other (Comment) (lives with daughter who is on disability ) Type of Home: House Home Access: Ramped entrance     Home Layout: One level Home Equipment: [place] - single point;Walker - 2 wheels;Bedside commode;Tub bench  Prior Function Level of Independence: Independent with assistive device(s)               Hand Dominance   Dominant Hand:  Right    Extremity/Trunk Assessment   Upper Extremity Assessment Upper Extremity Assessment: Defer to OT evaluation    Lower Extremity Assessment Lower Extremity Assessment: RLE deficits/detail RLE Deficits / Details: Bandaged knee       Communication   Communication: No difficulties  Cognition Arousal/Alertness: Awake/alert Behavior During Therapy: WFL for tasks assessed/performed Overall Cognitive Status: Within Functional Limits for tasks assessed                                        General Comments General comments (skin integrity, edema, etc.): Frequent cues for safety when standing and to place RLE on floor    Exercises Total Joint Exercises Ankle Circles/Pumps: AROM;Both;10 reps;Seated Quad Sets: Strengthening;Both;10 reps;Seated Gluteal Sets: Strengthening;Both;10 reps;Seated Straight Leg Raises: AAROM;Right;5 reps;Seated Long Arc Quad: AAROM;Right;10 reps;Seated Knee Flexion: AROM;Right;5 reps;Seated   Assessment/Plan    PT Assessment Patient needs continued PT services  PT Problem List Decreased strength;Decreased range of motion;Decreased activity tolerance;Decreased balance;Decreased mobility;Decreased knowledge of use of DME;Decreased safety awareness;Decreased knowledge of precautions;Pain       PT Treatment Interventions DME instruction;Gait training;Functional mobility training;Therapeutic activities;Therapeutic exercise;Balance training;Neuromuscular re-education;Patient/family education    PT Goals (Current goals can be found in the Care Plan section)  Acute Rehab PT Goals Patient Stated Goal: Get ewell PT Goal Formulation: With patient Time For Goal Achievement: 12/19/19 Potential to Achieve Goals: Good    Frequency 7X/week   Barriers to discharge Decreased caregiver support Daughter will be staying with her for 2 weeks to assist. Other daughter lives with her but is disabled. But able to assist with cooking/cleaning etc.     Co-evaluation               AM-PAC PT "6 Clicks" Mobility  Outcome Measure Help needed turning from your back to your side while in a flat bed without using bedrails?: A Little Help needed moving from lying on your back to sitting on the side of a flat bed without using bedrails?: A Little Help needed moving to and from a bed to a chair (including a wheelchair)?: A Lot Help needed standing up from a chair using your arms (e.g., wheelchair or bedside chair)?: A Lot Help needed to walk in hospital room?: A Little Help needed climbing 3-5 steps with a railing? : A Lot 6 Click Score: 15    End of Session Equipment Utilized During Treatment: Gait belt Activity Tolerance: Patient tolerated treatment well Patient left: in chair;with call bell/phone within reach;with chair alarm set;with family/visitor present;with SCD's reapplied Nurse Communication: Mobility status PT Visit Diagnosis: Other abnormalities of gait and mobility (R26.89);Muscle weakness (generalized) (M62.81);Difficulty in walking, not elsewhere classified (R26.2);Pain Pain - Right/Left: Right Pain - part of body: Knee    Time: 1007-1053 PT Time Calculation (min) (ACUTE ONLY): 46 min   Charges:   PT Evaluation $PT Eval Low Complexity: 1 Low PT Treatments $Gait Training: 8-22 mins $Therapeutic Exercise: 8-22 mins        Elayne Snare, PT, DPT  Ellouise Newer 12/05/2019, 11:15 AM

## 2019-12-05 NOTE — Progress Notes (Signed)
Orthopedic Tech Progress Note Patient Details:  Samantha [name] [facility] Feb 09, 1940 [id number] Patient was in chair. RN will call ortho when patient is back in the bed Patient ID: Samantha [name], female   DOB: November 14, 1940, 79 y.o.   MRN: [id number]   Samantha [name] 12/05/2019, 12:08 PM

## 2019-12-05 NOTE — Evaluation (Signed)
Occupational Therapy Evaluation Patient Details Name: Samantha [name] MRN: [id number] DOB: 07/28/1940 Today's Date: 12/05/2019    History of Present Illness 79 y.o. female s/p Rt TKA. Hx of CHF, CAD, DM, VBI, TIA.   Clinical Impression   Patient admitted for the above diagnosis and procedure.  R leg pain, poor stand balance, decreased safety and fair activity tolerance impact her current ADL, toilet, and mobility independence compared to her stated prior level of function.  At home she was living alone and largely independent with AD and DME.  HH OT will assist with a safe discharge home, but OT will follow in the acute setting.  Hip kit reviewed.      Follow Up Recommendations  Home health OT;Supervision/Assistance - 24 hour    Equipment Recommendations  Other (comment) (LH Reacher and LH Sponge)    Recommendations for Other Services       Precautions / Restrictions Precautions Precautions: Fall Precaution Booklet Issued: Yes (comment) Required Braces or Orthoses: Knee Immobilizer - Right Knee Immobilizer - Right: On when out of bed or walking Restrictions Weight Bearing Restrictions: Yes RLE Weight Bearing: Weight bearing as tolerated Other Position/Activity Restrictions: Patient is hesitant to place weight through RLE      Mobility Bed Mobility Overal bed mobility: Needs Assistance Bed Mobility: Supine to Sit     Supine to sit: Mod assist     General bed mobility comments: Min assist for RLE support to EOB. Guarded. Cues for technique. Very slow.    Transfers Overall transfer level: Needs assistance Equipment used: Rolling walker (2 wheeled) Transfers: Sit to/from [employer] Sit to Stand: Mod assist Stand pivot transfers: Min assist       General transfer comment: Patient with decreased blance and safety initially - trying to hold R leg off the ground.   Once she bore wieght through her R leg - increased balance and safety.  Reviewed RW  management and sequencing.    Balance Overall balance assessment: Mild deficits observed, not formally tested Sitting-balance support: No upper extremity supported Sitting balance-Leahy Scale: Fair     Standing balance support: Single extremity supported Standing balance-Leahy Scale: Poor                             ADL either performed or assessed with clinical judgement   ADL Overall ADL's : Needs assistance/impaired Eating/Feeding: Independent   Grooming: Wash/dry hands;Wash/dry face;Oral care;Sitting;Set up   Upper Body Bathing: Set up;Sitting   Lower Body Bathing: Moderate assistance;Sitting/lateral leans   Upper Body Dressing : Set up;Sitting   Lower Body Dressing: Maximal assistance;Sit to/from stand   Toilet Transfer: Minimal assistance;BSC;RW   Toileting- Clothing Manipulation and Hygiene: Minimal assistance;Sitting/lateral lean       Functional mobility during ADLs: Minimal assistance;Rolling walker       Vision Baseline Vision/History: No visual deficits Patient Visual Report: No change from baseline       Perception     Praxis      Pertinent Vitals/Pain Pain Assessment: Faces Pain Score: 10-Worst pain ever Faces Pain Scale: Hurts whole lot Pain Location: Rt knee Pain Descriptors / Indicators: Grimacing;Guarding Pain Intervention(s): Monitored during session;Repositioned     Hand Dominance Right   Extremity/Trunk Assessment Upper Extremity Assessment Upper Extremity Assessment: Overall WFL for tasks assessed   Lower Extremity Assessment Lower Extremity Assessment: RLE deficits/detail RLE Deficits / Details: Bandaged knee       Communication Communication Communication:  No difficulties   Cognition Arousal/Alertness: Awake/alert Behavior During Therapy: WFL for tasks assessed/performed Overall Cognitive Status: Within Functional Limits for tasks assessed                                     General  Comments  Frequent cues for safety when standing and to place RLE on floor    Exercises    Shoulder Instructions      Home Living Family/patient expects to be discharged to:: Private residence Living Arrangements: Children Available Help at Discharge: Family;Available 24 hours/day;Other (Comment) Type of Home: House Home Access: Ramped entrance     Home Layout: One level     Bathroom Shower/Tub: Tub/shower unit         Home Equipment: Cane - single point;Walker - 2 wheels;Bedside commode;Tub bench          Prior Functioning/Environment Level of Independence: Independent with assistive device(s)                 OT Problem List: Decreased range of motion;Decreased activity tolerance;Impaired balance (sitting and/or standing);Decreased knowledge of use of DME or AE;Decreased safety awareness;Pain      OT Treatment/Interventions: Self-care/ADL training;Therapeutic exercise;DME and/or AE instruction;Balance training;Patient/family education;Therapeutic activities    OT Goals(Current goals can be found in the care plan section) Acute Rehab OT Goals Patient Stated Goal: I just want to not hurt and do more. OT Goal Formulation: With patient Time For Goal Achievement: 12/19/19 Potential to Achieve Goals: Good ADL Goals Pt Will Perform Grooming: Independently;sitting;standing Pt Will Perform Lower Body Bathing: with supervision;sit to/from stand Pt Will Perform Lower Body Dressing: sit to/from stand;with supervision Pt Will Transfer to Toilet: with modified independence;regular height toilet;ambulating  OT Frequency: Min 2X/week   Barriers to D/C: Decreased caregiver support          Co-evaluation              AM-PAC OT "6 Clicks" Daily Activity     Outcome Measure Help from another person eating meals?: None Help from another person taking care of personal grooming?: A Little Help from another person toileting, which includes using toliet, bedpan, or  urinal?: A Little Help from another person bathing (including washing, rinsing, drying)?: A Lot Help from another person to put on and taking off regular upper body clothing?: A Little Help from another person to put on and taking off regular lower body clothing?: A Lot 6 Click Score: 17   End of Session Equipment Utilized During Treatment: Gait belt;Rolling walker Nurse Communication: Mobility status  Activity Tolerance: Patient tolerated treatment well Patient left: in bed;with call bell/phone within reach;with nursing/sitter in room;with family/visitor present  OT Visit Diagnosis: Unsteadiness on feet (R26.81);Pain Pain - Right/Left: Right Pain - part of body: Leg                Time: 5397-6734 OT Time Calculation (min): 23 min Charges:  OT General Charges $OT Visit: 1 Visit OT Evaluation $OT Eval Moderate Complexity: 1 Mod  12/05/2019  Rich, OTR/L  Acute Rehabilitation Services  Office:  [place] 12/05/2019, 12:44 PM

## 2019-12-06 ENCOUNTER — Encounter (HOSPITAL_COMMUNITY): Payer: Self-pay | Admitting: Orthopaedic Surgery

## 2019-12-06 LAB — CBC
HCT: 30.1 % — ABNORMAL LOW (ref 36.0–46.0)
Hemoglobin: 10.1 g/dL — ABNORMAL LOW (ref 12.0–15.0)
MCH: 28.9 pg (ref 26.0–34.0)
MCHC: 33.6 g/dL (ref 30.0–36.0)
MCV: 86 fL (ref 80.0–100.0)
Platelets: 123 10*3/uL — ABNORMAL LOW (ref 150–400)
RBC: 3.5 MIL/uL — ABNORMAL LOW (ref 3.87–5.11)
RDW: 14.1 % (ref 11.5–15.5)
WBC: 7 10*3/uL (ref 4.0–10.5)
nRBC: 0 % (ref 0.0–0.2)

## 2019-12-06 LAB — GLUCOSE, CAPILLARY
Glucose-Capillary: 110 mg/dL — ABNORMAL HIGH (ref 70–99)
Glucose-Capillary: 108 mg/dL — ABNORMAL HIGH (ref 70–99)

## 2019-12-06 MED ORDER — ASPIRIN 325 MG PO TABS: 325.0000 mg | ORAL_TABLET | Freq: Every day | ORAL | Status: DC

## 2019-12-06 MED ORDER — HYDROCODONE-ACETAMINOPHEN 7.5-325 MG PO TABS
1.0000 | ORAL_TABLET | ORAL | 0 refills | Status: DC | PRN
Start: 2019-12-06 — End: 2019-12-11

## 2019-12-06 NOTE — Progress Notes (Signed)
   Subjective: 2 Days Post-Op Procedure(s) (LRB): RIGHT TOTAL KNEE ARTHROPLASTY (Right) Patient reports pain as moderate.    Objective: Vital signs in last 24 hours: Temp:  [98 F (36.7 C)-99.1 F (37.3 C)] 98.5 F (36.9 C) (11/07 0743) Pulse Rate:  [64-89] 85 (11/07 0743) Resp:  [15-17] 17 (11/07 0743) BP: (93-140)/(57-68) 93/64 (11/07 0743) SpO2:  [92 %-98 %] 98 % (11/07 0806)  Intake/Output from previous day: No intake/output data recorded. Intake/Output this shift: Total I/O In: 480 [P.O.:480] Out: -   Recent Labs    12/05/19 0513 12/06/19 0245  HGB 9.9* 10.1*   Recent Labs    12/05/19 0513 12/06/19 0245  WBC 6.4 7.0  RBC 3.48* 3.50*  HCT 29.9* 30.1*  PLT 127* 123*   No results for input(s): NA, K, CL, CO2, BUN, CREATININE, GLUCOSE, CALCIUM in the last 72 hours. No results for input(s): LABPT, INR in the last 72 hours.  Neurologically intact No results found.  Assessment/Plan: 2 Days Post-Op Procedure(s) (LRB): RIGHT TOTAL KNEE ARTHROPLASTY (Right) Up with therapy, discharge home after does steps .   Samantha [name] 12/06/2019, 11:42 AM

## 2019-12-06 NOTE — Discharge Instructions (Signed)

## 2019-12-06 NOTE — Plan of Care (Signed)
  Problem: Pain Managment: Goal: General experience of comfort will improve Outcome: Progressing   Problem: Safety: Goal: Ability to remain free from injury will improve Outcome: Progressing   Problem: Skin Integrity: Goal: Risk for impaired skin integrity will decrease Outcome: Progressing   

## 2019-12-06 NOTE — Progress Notes (Signed)
Physical Therapy Treatment Patient Details Name: Samantha [name] MRN: [id number] DOB: [birth year] Today's Date: 12/06/2019    History of Present Illness 79 y.o. female s/p Rt TKA. Hx of CHF, CAD, DM, VBI, TIA.    PT Comments    Pt demonstrates limited activity tolerance this session, reporting she feels feverish and generally unwell. Pt is limited to transfers and very short distances of ambulation at this time. Pt does demonstrate reduced assistance requirements in transfers and bed mobility. Pt will benefit from continued acute PT POC to improve activity tolerance and to reduce falls risk.   Follow Up Recommendations  Follow surgeon's recommendation for DC plan and follow-up therapies     Equipment Recommendations   (DME already delivered to room)    Recommendations for Other Services       Precautions / Restrictions Precautions Precautions: Knee Precaution Booklet Issued: No Required Braces or Orthoses: Knee Immobilizer - Right Knee Immobilizer - Right: On when out of bed or walking Restrictions Weight Bearing Restrictions: Yes RLE Weight Bearing: Weight bearing as tolerated    Mobility  Bed Mobility Overal bed mobility: Needs Assistance Bed Mobility: Supine to Sit     Supine to sit: Supervision;HOB elevated     General bed mobility comments: use of rails and increased time  Transfers Overall transfer level: Needs assistance Equipment used: Rolling walker (2 wheeled) Transfers: Sit to/from [facility] Sit to Stand: Min guard Stand pivot transfers: Min guard       General transfer comment: pt is able to stand and SPT with minG assist for safety, PT cues to move RW closer to BOS when initiating transfers  Ambulation/Gait Ambulation/Gait assistance: Min guard Gait Distance (Feet): 3 Feet Assistive device: Rolling walker (2 wheeled) Gait Pattern/deviations: Step-to pattern Gait velocity: slow Gait velocity interpretation: <1.31 ft/sec,  indicative of household ambulator General Gait Details: pt with short steps to turn from bed to commode and from commode to Dentist    Modified Rankin (Stroke Patients Only)       Balance Overall balance assessment: Needs assistance Sitting-balance support: No upper extremity supported;Feet supported Sitting balance-Leahy Scale: Fair     Standing balance support: Single extremity supported Standing balance-Leahy Scale: Poor Standing balance comment: reliant on UE support for stability                            Cognition Arousal/Alertness: Awake/alert Behavior During Therapy: WFL for tasks assessed/performed Overall Cognitive Status: Within Functional Limits for tasks assessed                                        Exercises Total Joint Exercises Goniometric ROM: 36 active flexion, 40 Passive flexion, -9 active extension, -6 passive extension pain limiting    General Comments General comments (skin integrity, edema, etc.): pt with some dried blood noted under dressing, pt reports this is new since CPM was left on for many hours last night. Pt reporting she feels feverish and unwell, BP stable at 133/65.      Pertinent Vitals/Pain Pain Assessment: Faces Faces Pain Scale: Hurts little more Pain Location: R knee Pain Descriptors / Indicators: Grimacing Pain Intervention(s): Monitored during session    Home Living  Prior Function            PT Goals (current goals can now be found in the care plan section) Acute Rehab PT Goals Patient Stated Goal: get well Progress towards PT goals: Progressing toward goals (limited activity tolerance)    Frequency    7X/week      PT Plan Current plan remains appropriate    Co-evaluation              AM-PAC PT "6 Clicks" Mobility   Outcome Measure  Help needed turning from your back to your side while in a  flat bed without using bedrails?: None Help needed moving from lying on your back to sitting on the side of a flat bed without using bedrails?: None Help needed moving to and from a bed to a chair (including a wheelchair)?: A Little Help needed standing up from a chair using your arms (e.g., wheelchair or bedside chair)?: A Little Help needed to walk in hospital room?: A Little Help needed climbing 3-5 steps with a railing? : A Lot 6 Click Score: 19    End of Session Equipment Utilized During Treatment: Gait belt Activity Tolerance: Treatment limited secondary to medical complications (Comment) Patient left: in chair;with call bell/phone within reach;with chair alarm set Nurse Communication: Mobility status PT Visit Diagnosis: Other abnormalities of gait and mobility (R26.89);Muscle weakness (generalized) (M62.81);Difficulty in walking, not elsewhere classified (R26.2);Pain Pain - Right/Left: Right Pain - part of body: Knee     Time: 0824-0900 PT Time Calculation (min) (ACUTE ONLY): 36 min  Charges:  $Therapeutic Activity: 23-37 mins                     Zenaida Niece, PT, DPT Acute Rehabilitation Pager: [phone]    Zenaida Niece 12/06/2019, 10:03 AM

## 2019-12-06 NOTE — TOC Transition Note (Signed)
Transition of Care [facility]) - CM/SW Discharge Note   Patient Details  Name: Samantha [name] MRN: [id number] Date of Birth: [birth year]  [facility]) CM/SW Contact:  Claudie Leach, RN 12/06/2019, 12:42 PM   Clinical Narrative:    Per Jamse Arn' note 11/5, DME and HH needs planned.     Final next level of care: [place] Barriers to Discharge: No Barriers Identified   Discharge Plan and Services                DME Arranged: 3-N-1, Walker rolling DME Agency: [facility]       HH Arranged: PT [place] Agency: [facility] (formerly [employer])

## 2019-12-07 ENCOUNTER — Encounter (HOSPITAL_COMMUNITY): Payer: Self-pay | Admitting: Orthopaedic Surgery

## 2019-12-07 ENCOUNTER — Telehealth: Payer: Self-pay | Admitting: *Deleted

## 2019-12-07 DIAGNOSIS — Z8673 Personal history of transient ischemic attack (TIA), and cerebral infarction without residual deficits: Secondary | ICD-10-CM | POA: Diagnosis not present

## 2019-12-07 DIAGNOSIS — I2510 Atherosclerotic heart disease of native coronary artery without angina pectoris: Secondary | ICD-10-CM | POA: Diagnosis not present

## 2019-12-07 DIAGNOSIS — I5032 Chronic diastolic (congestive) heart failure: Secondary | ICD-10-CM | POA: Diagnosis not present

## 2019-12-07 DIAGNOSIS — I7300 Raynaud's syndrome without gangrene: Secondary | ICD-10-CM | POA: Diagnosis not present

## 2019-12-07 DIAGNOSIS — M810 Age-related osteoporosis without current pathological fracture: Secondary | ICD-10-CM | POA: Diagnosis not present

## 2019-12-07 DIAGNOSIS — Z471 Aftercare following joint replacement surgery: Secondary | ICD-10-CM | POA: Diagnosis not present

## 2019-12-07 DIAGNOSIS — M47816 Spondylosis without myelopathy or radiculopathy, lumbar region: Secondary | ICD-10-CM | POA: Diagnosis not present

## 2019-12-07 DIAGNOSIS — E1140 Type 2 diabetes mellitus with diabetic neuropathy, unspecified: Secondary | ICD-10-CM | POA: Diagnosis not present

## 2019-12-07 DIAGNOSIS — Z8544 Personal history of malignant neoplasm of other female genital organs: Secondary | ICD-10-CM | POA: Diagnosis not present

## 2019-12-07 DIAGNOSIS — K219 Gastro-esophageal reflux disease without esophagitis: Secondary | ICD-10-CM | POA: Diagnosis not present

## 2019-12-07 DIAGNOSIS — I110 Hypertensive heart disease with heart failure: Secondary | ICD-10-CM | POA: Diagnosis not present

## 2019-12-07 DIAGNOSIS — M4807 Spinal stenosis, lumbosacral region: Secondary | ICD-10-CM | POA: Diagnosis not present

## 2019-12-07 DIAGNOSIS — I700 Atherosclerosis of aorta: Secondary | ICD-10-CM | POA: Diagnosis not present

## 2019-12-07 DIAGNOSIS — I4891 Unspecified atrial fibrillation: Secondary | ICD-10-CM | POA: Diagnosis not present

## 2019-12-07 DIAGNOSIS — Z85038 Personal history of other malignant neoplasm of large intestine: Secondary | ICD-10-CM | POA: Diagnosis not present

## 2019-12-07 DIAGNOSIS — Z7984 Long term (current) use of oral hypoglycemic drugs: Secondary | ICD-10-CM | POA: Diagnosis not present

## 2019-12-07 DIAGNOSIS — Z96651 Presence of right artificial knee joint: Secondary | ICD-10-CM | POA: Diagnosis not present

## 2019-12-07 DIAGNOSIS — E785 Hyperlipidemia, unspecified: Secondary | ICD-10-CM | POA: Diagnosis not present

## 2019-12-07 DIAGNOSIS — B0229 Other postherpetic nervous system involvement: Secondary | ICD-10-CM | POA: Diagnosis not present

## 2019-12-07 DIAGNOSIS — M797 Fibromyalgia: Secondary | ICD-10-CM | POA: Diagnosis not present

## 2019-12-07 DIAGNOSIS — Z87891 Personal history of nicotine dependence: Secondary | ICD-10-CM | POA: Diagnosis not present

## 2019-12-07 DIAGNOSIS — J45909 Unspecified asthma, uncomplicated: Secondary | ICD-10-CM | POA: Diagnosis not present

## 2019-12-07 NOTE — Anesthesia Postprocedure Evaluation (Signed)
Anesthesia Post Note  Patient: Samantha [name]  Procedure(s) Performed: RIGHT TOTAL KNEE ARTHROPLASTY (Right Knee)     Patient location during evaluation: PACU Anesthesia Type: MAC and Spinal Level of consciousness: oriented and awake and alert Pain management: pain level controlled Vital Signs Assessment: post-procedure vital signs reviewed and stable Respiratory status: spontaneous breathing, respiratory function stable and patient connected to nasal cannula oxygen Cardiovascular status: blood pressure returned to baseline and stable Postop Assessment: no headache, no backache and no apparent nausea or vomiting Anesthetic complications: no   No complications documented.  Last Vitals:  Vitals:   12/06/19 0743 12/06/19 0806  BP: 93/64   Pulse: 85   Resp: 17   Temp: 36.9 C   SpO2: 95% 98%    Last Pain:  Vitals:   12/06/19 1359  TempSrc:   PainSc: [place]

## 2019-12-07 NOTE — Telephone Encounter (Signed)
Ortho bundle D/C call to patient. No answer and left VM requesting call back.

## 2019-12-08 ENCOUNTER — Telehealth: Payer: Self-pay | Admitting: *Deleted

## 2019-12-08 NOTE — Telephone Encounter (Signed)
Ortho bundle D/C call to patient completed.

## 2019-12-09 DIAGNOSIS — I7300 Raynaud's syndrome without gangrene: Secondary | ICD-10-CM | POA: Diagnosis not present

## 2019-12-09 DIAGNOSIS — I2510 Atherosclerotic heart disease of native coronary artery without angina pectoris: Secondary | ICD-10-CM | POA: Diagnosis not present

## 2019-12-09 DIAGNOSIS — I5032 Chronic diastolic (congestive) heart failure: Secondary | ICD-10-CM | POA: Diagnosis not present

## 2019-12-09 DIAGNOSIS — E1140 Type 2 diabetes mellitus with diabetic neuropathy, unspecified: Secondary | ICD-10-CM | POA: Diagnosis not present

## 2019-12-09 DIAGNOSIS — I110 Hypertensive heart disease with heart failure: Secondary | ICD-10-CM | POA: Diagnosis not present

## 2019-12-09 DIAGNOSIS — Z471 Aftercare following joint replacement surgery: Secondary | ICD-10-CM | POA: Diagnosis not present

## 2019-12-11 ENCOUNTER — Other Ambulatory Visit: Payer: Self-pay | Admitting: *Deleted

## 2019-12-11 ENCOUNTER — Other Ambulatory Visit: Payer: Self-pay | Admitting: Orthopaedic Surgery

## 2019-12-11 ENCOUNTER — Telehealth: Payer: Self-pay | Admitting: *Deleted

## 2019-12-11 DIAGNOSIS — Z96651 Presence of right artificial knee joint: Secondary | ICD-10-CM

## 2019-12-11 DIAGNOSIS — I5032 Chronic diastolic (congestive) heart failure: Secondary | ICD-10-CM | POA: Diagnosis not present

## 2019-12-11 DIAGNOSIS — I2510 Atherosclerotic heart disease of native coronary artery without angina pectoris: Secondary | ICD-10-CM | POA: Diagnosis not present

## 2019-12-11 DIAGNOSIS — Z471 Aftercare following joint replacement surgery: Secondary | ICD-10-CM | POA: Diagnosis not present

## 2019-12-11 DIAGNOSIS — M1711 Unilateral primary osteoarthritis, right knee: Secondary | ICD-10-CM

## 2019-12-11 DIAGNOSIS — E1140 Type 2 diabetes mellitus with diabetic neuropathy, unspecified: Secondary | ICD-10-CM | POA: Diagnosis not present

## 2019-12-11 DIAGNOSIS — I7300 Raynaud's syndrome without gangrene: Secondary | ICD-10-CM | POA: Diagnosis not present

## 2019-12-11 DIAGNOSIS — I110 Hypertensive heart disease with heart failure: Secondary | ICD-10-CM | POA: Diagnosis not present

## 2019-12-11 MED ORDER — HYDROCODONE-ACETAMINOPHEN 7.5-325 MG PO TABS
1.0000 | ORAL_TABLET | ORAL | 0 refills | Status: DC | PRN
Start: 2019-12-11 — End: 2019-12-18

## 2019-12-11 NOTE — Telephone Encounter (Signed)
7 day Ortho bundle call. Patient requested refill of pain medication. Thanks.

## 2019-12-11 NOTE — Telephone Encounter (Signed)
Please advise 

## 2019-12-11 NOTE — Telephone Encounter (Signed)
I sent in # [phone] more tabs ucall thanks

## 2019-12-14 DIAGNOSIS — Z471 Aftercare following joint replacement surgery: Secondary | ICD-10-CM | POA: Diagnosis not present

## 2019-12-14 DIAGNOSIS — I5032 Chronic diastolic (congestive) heart failure: Secondary | ICD-10-CM | POA: Diagnosis not present

## 2019-12-14 DIAGNOSIS — I110 Hypertensive heart disease with heart failure: Secondary | ICD-10-CM | POA: Diagnosis not present

## 2019-12-14 DIAGNOSIS — E1140 Type 2 diabetes mellitus with diabetic neuropathy, unspecified: Secondary | ICD-10-CM | POA: Diagnosis not present

## 2019-12-14 DIAGNOSIS — I2510 Atherosclerotic heart disease of native coronary artery without angina pectoris: Secondary | ICD-10-CM | POA: Diagnosis not present

## 2019-12-14 DIAGNOSIS — I7300 Raynaud's syndrome without gangrene: Secondary | ICD-10-CM | POA: Diagnosis not present

## 2019-12-15 NOTE — Discharge Summary (Signed)
Patient ID: Samantha [name] MRN: [id number] DOB/AGE: April 05, 1940 79 y.o.  Admit date: 12/04/2019 Discharge date: 12/06/2019 Admission Diagnoses:  Active Problems:   Unilateral primary osteoarthritis, right knee   S/P TKR (total knee replacement), right   Discharge Diagnoses:  Active Problems:   Unilateral primary osteoarthritis, right knee   S/P TKR (total knee replacement), right  status post Procedure(s): RIGHT TOTAL KNEE ARTHROPLASTY  Past Medical History:  Diagnosis Date  . A-fib ([place])   . Abnormal CT of the chest 2008   last CT4-l 2009:  . No f/u suggested   . Allergy   . Asthma   . CAD (coronary artery disease)    a. Coronary CTA 10/16: Coronary Ca score 211, mod non-obstructive CAD with LM mild plaque (25-50%), mid LAD 50-69%. b. Neg nuc 06/2015.  . Cataract    BILATERAL-REMOVED  . Chronic diastolic CHF (congestive heart failure) ([place])   . Collagen vascular disease ([place])    "arterial sclerosis" per pt  . Complication of anesthesia    trouble waking up  . Fibromyalgia   . GERD (gastroesophageal reflux disease)   . H/O hiatal hernia   . Heart murmur   . Hyperlipidemia   . Hypertension   . 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 Neuromuscular disorder ([facility])    FIBROMYALGIA  . Ocular migraine   . OSA (obstructive sleep apnea) 09/2007   dx w/ a sleep study, not on  CPAP  . Osteoarthritis   . Osteoporosis   . Pneumonia    "double" in 2004  . PONV (postoperative nausea and vomiting)   . Reactive airway disease 01/29/2002   dx of pseudoasthma / vcd in 2005 and nl sprirometry History of dyspnea, 2011,  improved after several medications were changed around Question of COPD, disproved July 06, 2009 with nl pft's      . Rheumatoid factor positive   . Shingles 11/2009  . Sleep apnea   . Stroke ([place])   . TIA (transient ischemic attack)    x2 - on Plavix for this  . Torn rotator cuff    right worse  than left, both are torn  . Tumor, 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  . Type II diabetes mellitus ([place])   . 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 Vaginal dysplasia     Surgeries: Procedure(s): RIGHT TOTAL KNEE ARTHROPLASTY on 12/04/2019   Consultants:   Discharged Condition: Improved  Hospital Course: CLASSIE WENG is an 79 y.o. female who was admitted 12/04/2019 for operative treatment of knee djd. Patient failed conservative treatments (please see the history and physical for the specifics) and had severe unremitting pain that affects sleep, daily activities and work/hobbies. After pre-op clearance, the patient was taken to the operating room on 12/04/2019 and underwent  Procedure(s): RIGHT TOTAL KNEE ARTHROPLASTY.    Patient was given perioperative antibiotics:  Anti-infectives (From admission, onward)   Start     Dose/Rate Route Frequency Ordered Stop   12/04/19 1830  hydroxychloroquine (PLAQUENIL) tablet 400 mg  Status:  Discontinued        400 mg Oral Daily 12/04/19 1732 12/07/19 0133   12/04/19 0630  vancomycin (VANCOCIN) IVPB 1000 mg/200 mL premix  Status:  Discontinued        1,000 mg 200 mL/hr over 60 Minutes Intravenous On call to O.R. 12/04/19 0620 12/04/19 1720       Patient  was given sequential compression devices and early ambulation to prevent DVT.   Patient benefited maximally from hospital stay and there were no complications. At the time of discharge, the patient was urinating/moving their bowels without difficulty, tolerating a regular diet, pain is controlled with oral pain medications and they have been cleared by PT/OT.   Recent vital signs: No data found.   Recent laboratory studies: No results for input(s): WBC, HGB, HCT, PLT, NA, K, CL, CO2, BUN, CREATININE, GLUCOSE, INR, CALCIUM in the last 72 hours.  Invalid input(s): PT, 2   Discharge Medications:   Allergies as of 12/06/2019      Reactions   Bactrim [sulfamethoxazole-trimethoprim] Other (See  Comments)   See OV 09-15-13, rash-tongue swelling due to bactrim ?   Cefuroxime Axetil Hives   Oxycodone Nausea And Vomiting   Pravastatin Other (See Comments)   "muscle breakdown" with profuse sweating   Seldane [terfenadine] Hives   Zocor [simvastatin] Other (See Comments)   2012 "Muscle breakdown " with profuse sweating   Tramadol Nausea And Vomiting   Atorvastatin Other (See Comments)   Pt reports muscle aches and joint pains   Latex    blisters   Lime Flavor [flavoring Agent] Diarrhea   Rosuvastatin Other (See Comments)   Pt reports "myalgias, fatigue, nosebleeds."   Tape Other (See Comments)   blisters      Medication List    TAKE these medications   acetaminophen 500 MG tablet Commonly known as: TYLENOL Take 500 mg by mouth every 6 (six) hours as needed for mild pain.   albuterol 108 (90 Base) MCG/ACT inhaler Commonly known as: VENTOLIN HFA Inhale 2 puffs into the lungs every 6 (six) hours as needed for wheezing or shortness of breath.   aspirin 325 MG tablet Commonly known as: Bayer Aspirin Take 1 tablet (325 mg total) by mouth daily. Take for 4 wks then stop   budesonide-formoterol 80-4.5 MCG/ACT inhaler Commonly known as: SYMBICORT Inhale 2 puffs into the lungs 2 (two) times daily.   Bystolic 10 MG tablet Generic drug: nebivolol TAKE 1 TABLET(10 MG) BY MOUTH DAILY What changed: See the new instructions.   CAL-MAG-ZINC PO Take 1 tablet by mouth daily.   cetirizine 10 MG tablet Commonly known as: ZYRTEC TAKE 1 TABLET(10 MG) BY MOUTH DAILY What changed:   how much to take  how to take this  when to take this  additional instructions   cholecalciferol 1000 units tablet Commonly known as: VITAMIN D Take 1,000 Units by mouth every morning.   cholestyramine 4 g packet Commonly known as: Questran Take 1 packet (4 g total) by mouth daily.   clopidogrel 75 MG tablet Commonly known as: PLAVIX TAKE 1 TABLET(75 MG) BY MOUTH DAILY   hydrocortisone  2.5 % cream Apply topically 2 (two) times daily.   hydroxychloroquine 200 MG tablet Commonly known as: PLAQUENIL Take 400 mg by mouth daily.   isosorbide mononitrate 60 MG 24 hr tablet Commonly known as: IMDUR TAKE 1 TABLET(60 MG) BY MOUTH DAILY What changed: See the new instructions.   losartan 25 MG tablet Commonly known as: COZAAR Take 1 tablet (25 mg total) by mouth daily.   metFORMIN 500 MG 24 hr tablet Commonly known as: GLUCOPHAGE-XR TAKE 1 TABLET(500 MG) BY MOUTH DAILY WITH BREAKFAST What changed: See the new instructions.   Potassium 99 MG Tabs Take 99 mg by mouth daily.   pregabalin 75 MG capsule Commonly known as: LYRICA TAKE 1 CAPSULE(75 MG) BY MOUTH AT  BEDTIME   Repatha SureClick 761 MG/ML Soaj Generic drug: Evolocumab Inject 1 pen into the skin every 14 (fourteen) days.   Systane 0.4-0.3 % Gel ophthalmic gel Generic drug: Polyethyl Glycol-Propyl Glycol Place 1 application into both eyes daily as needed (for dry eyes).   torsemide 20 MG tablet Commonly known as: DEMADEX TAKE 1 TABLET(20 MG) BY MOUTH DAILY What changed: See the new instructions.       Diagnostic Studies: DG Chest 2 View  Result Date: 11/29/2019 CLINICAL DATA:  79 year old female with a history of pending knee replacement EXAM: CHEST - 2 VIEW COMPARISON:  01/16/2018 FINDINGS: Cardiomediastinal silhouette unchanged in size and contour. No pneumothorax. No pleural effusion. Coarsened interstitial markings similar to the prior. No confluent airspace disease. Partially imaged surgical changes of the cervical region. Degenerative changes of the spine. IMPRESSION: Negative for acute cardiopulmonary disease Electronically Signed   By: Corrie Mckusick D.O.   On: 11/29/2019 09:03   MYOCARDIAL PERFUSION IMAGING  Result Date: 11/18/2019  Nuclear stress EF: 63%.  There was no ST segment deviation noted during stress.  No T wave inversion was noted during stress.  The study is normal.  This is a  low risk study.  The left ventricular ejection fraction is normal (55-65%).  1. Normal study without ischemia or infarction. 2. Normal LVEF, 63%. 3. This is a low-risk study.       Follow-up Information    Marybelle Killings, MD. Go on 12/18/2019.   Specialty: Orthopedic Surgery Why: at 1:15 pm for your first in office post-op appointment Contact information: [place] [address] [phone]        [facility] Follow up.   Specialty: Home Health Services Why: Someone from the home health agency will be in contact with you to arrange your first in home physical therapy appointment after discharge Contact information: [place] [address] [phone]        [facility] Follow up.   Specialty: Rehabilitation Why: This appointment is pending. Your office case manager will assist with scheduling this for you. Contact information: [address] [place] [address] [phone]              Discharge Plan:  discharge to home  Disposition:     Signed: Benjiman Core  12/15/2019, 3:04 PM

## 2019-12-17 DIAGNOSIS — I5032 Chronic diastolic (congestive) heart failure: Secondary | ICD-10-CM | POA: Diagnosis not present

## 2019-12-17 DIAGNOSIS — I110 Hypertensive heart disease with heart failure: Secondary | ICD-10-CM | POA: Diagnosis not present

## 2019-12-17 DIAGNOSIS — I2510 Atherosclerotic heart disease of native coronary artery without angina pectoris: Secondary | ICD-10-CM | POA: Diagnosis not present

## 2019-12-17 DIAGNOSIS — Z471 Aftercare following joint replacement surgery: Secondary | ICD-10-CM | POA: Diagnosis not present

## 2019-12-17 DIAGNOSIS — E1140 Type 2 diabetes mellitus with diabetic neuropathy, unspecified: Secondary | ICD-10-CM | POA: Diagnosis not present

## 2019-12-17 DIAGNOSIS — I7300 Raynaud's syndrome without gangrene: Secondary | ICD-10-CM | POA: Diagnosis not present

## 2019-12-18 ENCOUNTER — Ambulatory Visit (INDEPENDENT_AMBULATORY_CARE_PROVIDER_SITE_OTHER): Payer: Medicare Other

## 2019-12-18 ENCOUNTER — Other Ambulatory Visit: Payer: Self-pay | Admitting: Cardiology

## 2019-12-18 ENCOUNTER — Ambulatory Visit (INDEPENDENT_AMBULATORY_CARE_PROVIDER_SITE_OTHER): Payer: Medicare Other | Admitting: Physical Therapy

## 2019-12-18 ENCOUNTER — Ambulatory Visit (INDEPENDENT_AMBULATORY_CARE_PROVIDER_SITE_OTHER): Payer: Medicare Other | Admitting: Orthopaedic Surgery

## 2019-12-18 ENCOUNTER — Encounter: Payer: Self-pay | Admitting: Orthopaedic Surgery

## 2019-12-18 ENCOUNTER — Encounter: Payer: Self-pay | Admitting: Physical Therapy

## 2019-12-18 ENCOUNTER — Other Ambulatory Visit: Payer: Self-pay

## 2019-12-18 ENCOUNTER — Telehealth: Payer: Self-pay | Admitting: *Deleted

## 2019-12-18 VITALS — BP 139/76 | HR 69 | Ht 62.0 in | Wt 195.0 lb

## 2019-12-18 DIAGNOSIS — I119 Hypertensive heart disease without heart failure: Secondary | ICD-10-CM

## 2019-12-18 DIAGNOSIS — M6281 Muscle weakness (generalized): Secondary | ICD-10-CM | POA: Diagnosis not present

## 2019-12-18 DIAGNOSIS — M25561 Pain in right knee: Secondary | ICD-10-CM | POA: Diagnosis not present

## 2019-12-18 DIAGNOSIS — R600 Localized edema: Secondary | ICD-10-CM | POA: Diagnosis not present

## 2019-12-18 DIAGNOSIS — M25661 Stiffness of right knee, not elsewhere classified: Secondary | ICD-10-CM

## 2019-12-18 DIAGNOSIS — Z96651 Presence of right artificial knee joint: Secondary | ICD-10-CM

## 2019-12-18 DIAGNOSIS — R2689 Other abnormalities of gait and mobility: Secondary | ICD-10-CM

## 2019-12-18 DIAGNOSIS — I2510 Atherosclerotic heart disease of native coronary artery without angina pectoris: Secondary | ICD-10-CM

## 2019-12-18 DIAGNOSIS — E782 Mixed hyperlipidemia: Secondary | ICD-10-CM

## 2019-12-18 DIAGNOSIS — Z01818 Encounter for other preprocedural examination: Secondary | ICD-10-CM

## 2019-12-18 DIAGNOSIS — Q613 Polycystic kidney, unspecified: Secondary | ICD-10-CM

## 2019-12-18 MED ORDER — HYDROCODONE-ACETAMINOPHEN 7.5-325 MG PO TABS
1.0000 | ORAL_TABLET | ORAL | 0 refills | Status: DC | PRN
Start: 2019-12-18 — End: 2019-12-22

## 2019-12-18 NOTE — Telephone Encounter (Signed)
14 day Ortho bundle call completed.  

## 2019-12-18 NOTE — Patient Instructions (Signed)
Access Code: [email or web address] URL: [email or web address] Date: 12/18/2019 Prepared by: Elsie Ra  Exercises Supine Heel Slide with Strap - 2 x daily - 6 x weekly - 1-2 sets - 10 reps - 5 hold Seated Hamstring Stretch - 2 x daily - 6 x weekly - 1 sets - 3 reps - 30 hold Supine Active Straight Leg Raise - 2 x daily - 6 x weekly - 1-2 sets - 10 reps Seated Long Arc Quad - 2 x daily - 6 x weekly - 2-3 sets - 10 reps - 3 sec hold Sit to Stand with Armchair - 2 x daily - 6 x weekly - 2-3 sets - 5 reps

## 2019-12-18 NOTE — Therapy (Signed)
[facility], [place], [address] Phone: [phone]   Fax:  [phone]  Physical Therapy Evaluation  Patient Details  Name: Samantha [name] MRN: [id number] Date of Birth: 1941/01/05 Referring Provider (PT): Rodell Perna   Encounter Date: 12/18/2019   PT End of Session - 12/18/19 1519    Visit Number 1    Number of Visits 18    Date for PT Re-Evaluation 02/12/20    PT Start Time 1435    PT Stop Time 1525    PT Time Calculation (min) 50 min    Activity Tolerance Patient tolerated treatment well    Behavior During Therapy [facility] for tasks assessed/performed           Past Medical History:  Diagnosis Date  . A-fib ([place])   . Abnormal CT of the chest 2008   last CT4-l 2009:  . No f/u suggested   . Allergy   . Asthma   . CAD (coronary artery disease)    a. Coronary CTA 10/16: Coronary Ca score 211, mod non-obstructive CAD with LM mild plaque (25-50%), mid LAD 50-69%. b. Neg nuc 06/2015.  . Cataract    BILATERAL-REMOVED  . Chronic diastolic CHF (congestive heart failure) ([place])   . Collagen vascular disease ([place])    "arterial sclerosis" per pt  . Complication of anesthesia    trouble waking up  . Fibromyalgia   . GERD (gastroesophageal reflux disease)   . H/O hiatal hernia   . Heart murmur   . Hyperlipidemia   . Hypertension   . Malignant neoplasm of ascending colon ([place]) 2016   Minimally invasive right hemicolectomy to be done   . Neuromuscular disorder (HCC)    FIBROMYALGIA  . Ocular migraine   . OSA (obstructive sleep apnea) 09/2007   dx w/ a sleep study, not on  CPAP  . Osteoarthritis   . Osteoporosis   . Pneumonia    "double" in 2004  . PONV (postoperative nausea and vomiting)   . Reactive airway disease 01/29/2002   dx of pseudoasthma / vcd in 2005 and nl sprirometry History of dyspnea, 2011,  improved after several medications were changed around Question of COPD, disproved July 06, 2009 with nl pft's       . Rheumatoid factor positive   . Shingles 11/2009  . Sleep apnea   . Stroke ([place])   . TIA (transient ischemic attack)    x2 - on Plavix for this  . Torn rotator cuff    right worse than left, both are torn  . Tumor, 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  . Type II diabetes mellitus ([place])   . 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 Vaginal dysplasia     Past Surgical History:  Procedure Laterality Date  . ABDOMINAL HYSTERECTOMY  1980   NO oophorectomy per pt   . ANTERIOR CERVICAL DECOMP/DISCECTOMY FUSION  2001   C 3, C4 and C5 plate and screws  . BREAST BIOPSY Right 1999  . BUNIONECTOMY Left ~ 1977  . CARDIAC CATHETERIZATION     2018 By Dr. Pernell Dupre (done after colon surgery)  . CATARACT EXTRACTION W/ INTRAOCULAR LENS  IMPLANT, BILATERAL  2012  . COLON SURGERY  10/2014  . EYE SURGERY Bilateral    torq lens for cataracts  . LEFT HEART CATH AND CORONARY ANGIOGRAPHY N/A 03/22/2016   Procedure: Left Heart Cath and Coronary Angiography;  Surgeon: Belva Crome, MD;  Location: [facility]  INVASIVE CV LAB;  Service: Cardiovascular;  Laterality: N/A;  . LUMBAR LAMINECTOMY/DECOMPRESSION MICRODISCECTOMY N/A 01/20/2018   Procedure: L4-5 decompression;  Surgeon: Marybelle Killings, MD;  Location: [facility];  Service: Orthopedics;  Laterality: N/A;  . THYROIDECTOMY, PARTIAL  1960's  . TOTAL KNEE ARTHROPLASTY Right 12/04/2019   Procedure: RIGHT TOTAL KNEE ARTHROPLASTY;  Surgeon: Marybelle Killings, MD;  Location: [facility];  Service: Orthopedics;  Laterality: Right;  RNFA [place]   "Laser surgery for vaginal cancer; followed by chemotherapy" (06/27/2012)    There were no vitals filed for this visit.    Subjective Assessment - 12/18/19 1436    Subjective S/P Rt TKA 12/04/19, she says if she takes her pain medicine the pain is controlled for the most part.    Pertinent History PMH: Rt TKA 12/04/19. afib, CAD,CHF,fibromyalgia,neck fusion, lumbar decompression sx    Limitations  Walking;House hold activities;Standing;Lifting    How long can you stand comfortably? maybe 6 minutes with RW    Patient Stated Goals reduce pain and improve knee    Currently in Pain? Yes    Pain Score 5     Pain Location Knee    Pain Orientation Right;Anterior    Pain Descriptors / Indicators Aching;Dull;Numbness;Tingling    Pain Type Surgical pain    Pain Onset 1 to 4 weeks ago    Pain Frequency Constant    Aggravating Factors  straigthening her leg, standing    Pain Relieving Factors rest, ice, propping her leg up    Multiple Pain Sites No              [facility] PT Assessment - 12/18/19 0001      Assessment   Medical Diagnosis S/P Rt TKA 12/04/19    Referring Provider (PT) Rodell Perna    Next MD Visit 01/12/14    Prior Therapy HHPT      Precautions   Precautions None      Balance Screen   Has the patient fallen in the past 6 months No    Has the patient had a decrease in activity level because of a fear of falling?  No    Is the patient reluctant to leave their home because of a fear of falling?  No      Home Ecologist residence    Additional Comments has ramp to enter      Prior Function   Level of Independence Independent with basic ADLs    Vocation Retired    Software engineer, read, Manufacturing engineer   Overall Cognitive Status Within Functional Limits for tasks assessed      Observation/Other Assessments   Focus on Therapeutic Outcomes (FOTO)  21% functional intake      Observation/Other Assessments-Edema    Edema --   Moderate edema in Rt knee     Sensation   Light Touch Appears Intact      ROM / Strength   AROM / PROM / Strength AROM;Strength;PROM      AROM   AROM Assessment Site Knee    Right/Left Knee Right    Right Knee Extension 7    Right Knee Flexion 75      PROM   PROM Assessment Site Knee    Right/Left Knee Right    Right Knee Extension 5    Right Knee Flexion 90      Strength   Overall Strength  Comments tested in sitting    Strength Assessment Site Hip;Knee    Right/Left Hip Right    Right Hip Flexion 3+/5    Right Hip ABduction 4/5    Right/Left Knee Right    Right Knee Flexion 3/5    Right Knee Extension 3/5      Ambulation/Gait   Ambulation/Gait Yes    Ambulation/Gait Assistance 5: Supervision    Ambulation Distance (Feet) 75 Feet    Assistive device Rolling walker    Gait Pattern Decreased step length - right;Decreased step length - left;Decreased stance time - right;Decreased stride length;Decreased hip/knee flexion - right    Gait velocity slower      Standardized Balance Assessment   Standardized Balance Assessment Five Times Sit to Stand    Five times sit to stand comments  33 seconds from chair with UE support                      Objective measurements completed on examination: See above findings.       [facility] Adult PT Treatment/Exercise - 12/18/19 0001      Exercises   Exercises Knee/Hip      Knee/Hip Exercises: Stretches   Knee: Self-Stretch Limitations supine heelsides 5 sec X 10 reps      Knee/Hip Exercises: Aerobic   Nustep L3 X 5 min UE/LE      Knee/Hip Exercises: Seated   Long Arc Quad Right;10 reps      Knee/Hip Exercises: Supine   Straight Leg Raises Right;10 reps      Modalities   Modalities Vasopneumatic      Vasopneumatic   Number Minutes Vasopneumatic  10 minutes    Vasopnuematic Location  Knee    Vasopneumatic Pressure Medium    Vasopneumatic Temperature  34      Manual Therapy   Manual therapy comments Rt knee PROM flexion and extension mobs grade 1-2                  PT Education - 12/18/19 1518    Education Details HEP, POC    Person(s) Educated Patient    Methods Explanation;Demonstration;Verbal cues;Handout    Comprehension Verbalized understanding;Returned demonstration;Need further instruction            PT Short Term Goals - 12/18/19 1531      PT SHORT TERM GOAL #1   Title Pt will be I  and compliant with HEP.    Time 4    Period Weeks    Status New    Target Date 01/15/20      PT SHORT TERM GOAL #2   Title Pt will improve FOTO score to 30% funciton    Time 4    Period Weeks    Status New      PT SHORT TERM GOAL #3   Title -      PT SHORT TERM GOAL #4   Title -             PT Long Term Goals - 12/18/19 1535      PT LONG TERM GOAL #1   Title Pt will improve FOTO score to at least 43% function.    Time 8    Period Weeks    Status New    Target Date 02/12/20      PT LONG TERM GOAL #2   Title Pt will improve Rt knee ROM 0-115 deg to improve funciton.    Time 8  Period Weeks    Status New      PT LONG TERM GOAL #3   Title Pt will improve Rt knee strength to 5/5 MMT to improve function.    Time 8    Period Weeks    Status New      PT LONG TERM GOAL #4   Title Pt will improve 5TSTS to less than 18 seconds    Baseline 33    Time 8    Period Weeks    Status New      PT LONG TERM GOAL #5   Title Pt will be able to ambulate 500' over even/uneven terrain with LRAD at MOD I level    Time 8    Period Weeks    Status New                  Plan - 12/18/19 1519    Clinical Impression Statement Pt presents to PT S/P Rt TKA 12/04/19. She will benefit from skilled PT to address her functional deficits in strength, ROM, gait, and standing tolerance.    Personal Factors and Comorbidities Comorbidity 3+    Comorbidities PMH: afib, CAD,CHF,fibromyalgia,neck fusion, lumbar decompression sx    Examination-Activity Limitations Bend;Carry;Lift;Stand;Squat;Stairs;Sleep;Locomotion Level    Examination-Participation Restrictions Community Activity;Driving;Laundry;Yard Work;Shop;Cleaning    Stability/Clinical Decision Making Evolving/Moderate complexity    Clinical Decision Making Moderate    Rehab Potential Good    PT Frequency 3x / week   2-3   PT Treatment/Interventions ADLs/Self Care Home Management;Cryotherapy;Electrical Stimulation;Iontophoresis  4mg /ml Dexamethasone;Moist Heat;Ultrasound;Gait training;Stair training;Therapeutic activities;Therapeutic exercise;Balance training;Neuromuscular re-education;Manual techniques;Passive range of motion;Dry needling;Joint Manipulations;Spinal Manipulations;Vasopneumatic Device;Taping    PT Next Visit Plan review and update HEP PRN, Nu step, ROM, manual, light strengthening to tolerance    PT Home Exercise Plan Access Code: [email or web address]    Consulted and Agree with Plan of Care Patient           Patient will benefit from skilled therapeutic intervention in order to improve the following deficits and impairments:  Abnormal gait, Decreased activity tolerance, Decreased mobility, Decreased endurance, Decreased range of motion, Decreased strength, Increased edema, Difficulty walking, Increased fascial restricitons, Increased muscle spasms, Impaired flexibility, Pain, Postural dysfunction, Improper body mechanics  Visit Diagnosis: Acute pain of right knee  Stiffness of right knee, not elsewhere classified  Muscle weakness (generalized)  Other abnormalities of gait and mobility  Localized edema     Problem List Patient Active Problem List   Diagnosis Date Noted  . 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 Aortic atherosclerosis ([place]) 11/03/2019  . Posterior vitreous detachment of right eye 07/15/2019  . Posterior vitreous detachment of left eye 07/15/2019  . 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 Diabetes mellitus without complication ([place]) 01/60/1093  . 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 Raynaud phenomenon 05/06/2019  . Trigger finger, left ring finger 03/18/2019  . Unilateral primary osteoarthritis, right knee 01/28/2018  . 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 01/28/2018  . Sacral back pain 10/16/2017  . Chronic left upper quadrant pain 07/23/2017  . Rectal bleeding 07/23/2017  . Poor balance 03/14/2017  . Right shoulder pain  03/14/2017  . Angina pectoris ([place]) 03/22/2016  . Hypertensive heart disease 07/07/2015  . Essential hypertension 07/07/2015  . Plantar fasciitis, left 03/22/2015  . Coronary artery disease due to lipid rich plaque 01/05/2015  . Chronic diastolic CHF (congestive heart failure), NYHA class 2 ([place]) 01/05/2015  . 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 Migraine ([place]) 01/05/2014  . VBI (vertebrobasilar insufficiency) 08/22/2012  . TIA (transient ischemic attack) 06/27/2012  . DJD (degenerative joint disease) 02/02/2011  . 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 Postherpetic neuralgia ? 01/02/2010  . Palpitations 11/23/2008  . UTI'S, RECURRENT 09/28/2008  . Fibromyalgia 08/15/2007  . 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 Hyperlipidemia 05/21/2006  . Reactive airway disease 01/29/2002    Silvestre Mesi 12/18/2019, 3:39 PM  [facility] [address] [place], [place], [address] Phone: [phone]   Fax:  [phone]  Name: TREZURE CRONK MRN: [id number] Date of Birth: 1940-09-26

## 2019-12-18 NOTE — Progress Notes (Signed)
Post-Op Visit Note   Patient: Samantha [name]           Date of Birth: November 26, 1940           MRN: [id number] Visit Date: 12/18/2019 PCP: [name], MD   Assessment & Plan: Post right total knee arthroplasty.  She has an appointment starting outpatient therapy upstairs today.  norco 7.5 renewed.  Recheck 4 weeks.  Chief Complaint:  Chief Complaint  Patient presents with  . Right Knee - Routine Post Op    12/04/2019 Right TKA   Visit Diagnoses:  1. Status post total right knee replacement     Plan: X-ray showed good position alignment return 4 weeks.  Progress to outpatient therapy.  Follow-Up Instructions: No follow-ups on file.   Orders:  Orders Placed This Encounter  Procedures  . XR Knee 1-2 Views Right   No orders of the defined types were placed in this encounter.   Imaging: XR Knee 1-2 Views Right  Result Date: 12/18/2019 Standing AP both knees lateral right knee obtained and reviewed this shows right total knee arthroplasty good position and alignment without subsidence. Impression: Satisfactory right total knee arthroplasty.  Again left knee osteoarthritis is noted.   PMFS History: Patient Active Problem List   Diagnosis Date Noted  . 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 Aortic atherosclerosis ([place]) 11/03/2019  . Posterior vitreous detachment of right eye 07/15/2019  . Posterior vitreous detachment of left eye 07/15/2019  . 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 Diabetes mellitus without complication ([place]) 82/42/3536  . 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 Raynaud phenomenon 05/06/2019  . Trigger finger, left ring finger 03/18/2019  . Unilateral primary osteoarthritis, right knee 01/28/2018  . 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 01/28/2018  . Sacral back pain 10/16/2017  . Chronic left upper quadrant pain 07/23/2017  . Rectal bleeding 07/23/2017  . Poor balance 03/14/2017  . Right  shoulder pain 03/14/2017  . Angina pectoris ([place]) 03/22/2016  . Hypertensive heart disease 07/07/2015  . Essential hypertension 07/07/2015  . Plantar fasciitis, left 03/22/2015  . Coronary artery disease due to lipid rich plaque 01/05/2015  . Chronic diastolic CHF (congestive heart failure), NYHA class 2 ([place]) 01/05/2015  . 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 Migraine ([place]) 01/05/2014  . VBI (vertebrobasilar insufficiency) 08/22/2012  . TIA (transient ischemic attack) 06/27/2012  . DJD (degenerative joint disease) 02/02/2011  . 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 Postherpetic neuralgia ? 01/02/2010  . Palpitations 11/23/2008  . UTI'S, RECURRENT 09/28/2008  . Fibromyalgia 08/15/2007  . 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 Hyperlipidemia 05/21/2006  . Reactive airway disease 01/29/2002   Past Medical History:  Diagnosis Date  . A-fib ([place])   . Abnormal CT of the chest 2008   last CT4-l 2009:  . No f/u suggested   . Allergy   . Asthma   . CAD (coronary artery disease)    a. Coronary CTA 10/16: Coronary Ca score 211, mod non-obstructive CAD with LM mild plaque (25-50%), mid LAD 50-69%. b. Neg nuc 06/2015.  . Cataract    BILATERAL-REMOVED  . Chronic diastolic CHF (congestive heart failure) ([place])   . Collagen vascular disease ([place])    "arterial sclerosis" per pt  . Complication of anesthesia    trouble waking up  . Fibromyalgia   . GERD (gastroesophageal reflux disease)   .  H/O hiatal hernia   . Heart murmur   . Hyperlipidemia   . Hypertension   . Malignant neoplasm of ascending colon ([place]) 2016   Minimally invasive right hemicolectomy to be done   . Neuromuscular disorder (H[facility])    FIBROMYALGIA  . Ocular migraine   . OSA (obstructive sleep apnea) 09/2007   dx w/ a sleep study, not on  CPAP  . Osteoarthritis   . Osteoporosis   . Pneumonia    "double" in 2004  . PONV  (postoperative nausea and vomiting)   . Reactive airway disease 01/29/2002   dx of pseudoasthma / vcd in 2005 and nl sprirometry History of dyspnea, 2011,  improved after several medications were changed around Question of COPD, disproved July 06, 2009 with nl pft's      . Rheumatoid factor positive   . Shingles 11/2009  . Sleep apnea   . Stroke ([place])   . TIA (transient ischemic attack)    x2 - on Plavix for this  . Torn rotator cuff    right worse than left, both are torn  . Tumor, 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  . Type II diabetes mellitus ([place])   . 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 Vaginal dysplasia     Family History  Problem Relation Age of Onset  . Heart disease Father   . Heart disease Mother   . Lung cancer Mother   . Allergies Sister   . Parkinsonism Sister        possible  . Asthma Sister   . Asthma Paternal Grandmother   . Stroke Paternal Grandmother   . Heart disease Other        paternal grandparents, maternal grandparents,   . Heart disease Brother   . Emphysema Brother   . Aneurysm Brother        x3  . Kidney failure Brother   . Diabetes Brother   . Diabetes Brother   . Stroke Brother   . Stroke Maternal Grandmother   . Breast cancer Neg Hx   . Colon cancer Neg Hx   . Heart attack Neg Hx   . Esophageal cancer Neg Hx   . Rectal cancer Neg Hx   . Stomach cancer Neg Hx     Past Surgical History:  Procedure Laterality Date  . ABDOMINAL HYSTERECTOMY  1980   NO oophorectomy per pt   . ANTERIOR CERVICAL DECOMP/DISCECTOMY FUSION  2001   C 3, C4 and C5 plate and screws  . BREAST BIOPSY Right 1999  . BUNIONECTOMY Left ~ 1977  . CARDIAC CATHETERIZATION     2018 By Dr. Pernell Dupre (done after colon surgery)  . CATARACT EXTRACTION W/ INTRAOCULAR LENS  IMPLANT, BILATERAL  2012  . COLON SURGERY  10/2014  . EYE SURGERY Bilateral    torq lens for cataracts  . LEFT HEART CATH AND CORONARY ANGIOGRAPHY N/A 03/22/2016   Procedure: Left Heart Cath and  Coronary Angiography;  Surgeon: Belva Crome, MD;  Location: [facility] CV [facility];  Service: Cardiovascular;  Laterality: N/A;  . LUMBAR LAMINECTOMY/DECOMPRESSION MICRODISCECTOMY N/A 01/20/2018   Procedure: L4-5 decompression;  Surgeon: Marybelle Killings, MD;  Location: [facility];  Service: Orthopedics;  Laterality: N/A;  . THYROIDECTOMY, PARTIAL  1960's  . TOTAL KNEE ARTHROPLASTY Right 12/04/2019   Procedure: RIGHT TOTAL KNEE ARTHROPLASTY;  Surgeon: Marybelle Killings, MD;  Location: [facility];  Service: Orthopedics;  Laterality: Right;  RNFA [facility] [place]  .  VAGINAL MASS EXCISION  1994   "Laser surgery for vaginal cancer; followed by chemotherapy" (06/27/2012)   Social History   Occupational History  . Occupation: Retired, disable since 2000    Employer: RETIRED  Tobacco Use  . Smoking status: Former Smoker    Packs/day: 0.25    Years: 5.00    Pack years: 1.25    Types: Cigarettes    Quit date: 01/29/1998    Years since quitting: 21.8  . Smokeless tobacco: Never Used  . Tobacco comment: Quit in 2001  Vaping Use  . Vaping Use: Never used  Substance and Sexual Activity  . Alcohol use: No    Alcohol/week: 0.0 standard drinks  . Drug use: No  . Sexual activity: Never

## 2019-12-21 ENCOUNTER — Ambulatory Visit (INDEPENDENT_AMBULATORY_CARE_PROVIDER_SITE_OTHER): Payer: Medicare Other | Admitting: Physical Therapy

## 2019-12-21 ENCOUNTER — Other Ambulatory Visit: Payer: Self-pay

## 2019-12-21 ENCOUNTER — Encounter: Payer: Self-pay | Admitting: Physical Therapy

## 2019-12-21 ENCOUNTER — Telehealth: Payer: Self-pay | Admitting: Orthopaedic Surgery

## 2019-12-21 DIAGNOSIS — R2689 Other abnormalities of gait and mobility: Secondary | ICD-10-CM

## 2019-12-21 DIAGNOSIS — M6281 Muscle weakness (generalized): Secondary | ICD-10-CM

## 2019-12-21 DIAGNOSIS — M25661 Stiffness of right knee, not elsewhere classified: Secondary | ICD-10-CM | POA: Diagnosis not present

## 2019-12-21 DIAGNOSIS — R600 Localized edema: Secondary | ICD-10-CM | POA: Diagnosis not present

## 2019-12-21 DIAGNOSIS — M25561 Pain in right knee: Secondary | ICD-10-CM | POA: Diagnosis not present

## 2019-12-21 NOTE — Therapy (Signed)
[facility] Physical Therapy [address] [place], [place], [address] Phone: [phone]   Fax:  [phone]  Physical Therapy Treatment  Patient Details  Name: Samantha [name] MRN: [id number] Date of Birth: 1940-06-07 Referring Provider (PT): Rodell Perna   Encounter Date: 12/21/2019   PT End of Session - 12/21/19 1134    Visit Number 2    Number of Visits 18    Date for PT Re-Evaluation 02/12/20    PT Start Time 1103    PT Stop Time 8502    PT Time Calculation (min) 42 min    Activity Tolerance Patient tolerated treatment well    Behavior During Therapy [facility] for tasks assessed/performed           Past Medical History:  Diagnosis Date   A-fib ([place])    Abnormal CT of the chest 2008   last CT4-l 2009:  . No f/u suggested    Allergy    Asthma    CAD (coronary artery disease)    a. Coronary CTA 10/16: Coronary Ca score 211, mod non-obstructive CAD with LM mild plaque (25-50%), mid LAD 50-69%. b. Neg nuc 06/2015.   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[place]) 2016   Minimally invasive right hemicolectomy to be done    Neuromuscular disorder (HCC)    FIBROMYALGIA   Ocular migraine    OSA (obstructive sleep apnea) 09/2007   dx w/ a sleep study, not on  CPAP   Osteoarthritis    Osteoporosis    Pneumonia    "double" in 2004   PONV (postoperative nausea and vomiting)    Reactive airway disease 01/29/2002   dx of pseudoasthma / vcd in 2005 and nl sprirometry History of dyspnea, 2011,  improved after several medications were changed around Question of COPD, disproved July 06, 2009 with nl pft's        Rheumatoid factor positive    Shingles 11/2009   Sleep apnea    Stroke ([place])    TIA (transient ischemic attack)    x2 - on Plavix for this   Torn rotator cuff    right worse than left, both are torn   Tumor, 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   Type II diabetes mellitus ([place])    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 Pernell Dupre (done after colon surgery)   CATARACT EXTRACTION W/ INTRAOCULAR LENS  IMPLANT, BILATERAL  2012   COLON SURGERY  10/2014   EYE SURGERY Bilateral    torq lens for cataracts   LEFT HEART CATH AND CORONARY ANGIOGRAPHY N/A 03/22/2016   Procedure: Left Heart Cath and Coronary Angiography;  Surgeon: Belva Crome, MD;  Location: [facility]  INVASIVE CV LAB;  Service: Cardiovascular;  Laterality: N/A;   LUMBAR LAMINECTOMY/DECOMPRESSION MICRODISCECTOMY N/A 01/20/2018   Procedure: L4-5 decompression;  Surgeon: Marybelle Killings, MD;  Location: [place];  Service: Orthopedics;  Laterality: N/A;   THYROIDECTOMY, PARTIAL  1960's   TOTAL KNEE ARTHROPLASTY Right 12/04/2019   Procedure: RIGHT TOTAL KNEE ARTHROPLASTY;  Surgeon: Marybelle Killings, MD;  Location: [place];  Service: Orthopedics;  Laterality: Right;  RNFA APRIL [place]   "Laser surgery for vaginal cancer; followed by chemotherapy" (06/27/2012)    There were no vitals filed for this visit.   Subjective Assessment - 12/21/19 1121    Subjective She relays her Rt knee is doing a lot better since the PT visit last time. She is getting out of bed better and not having much pain.    Pertinent History PMH: Rt TKA 12/04/19. afib, CAD,CHF,fibromyalgia,neck fusion, lumbar  decompression sx    Limitations Walking;House hold activities;Standing;Lifting    How long can you stand comfortably? maybe 6 minutes with RW    Patient Stated Goals reduce pain and improve knee    Pain Onset 1 to 4 weeks ago           [facility] Adult PT Treatment/Exercise - 12/21/19 0001      Ambulation/Gait   Ambulation/Gait Yes    Ambulation/Gait Assistance 5: Supervision    Ambulation Distance (Feet) 100 Feet    Assistive device Rolling walker    Gait Pattern Decreased step length - right;Decreased step length - left;Decreased stance time - right;Decreased stride length;Decreased hip/knee flexion - right      Knee/Hip Exercises: Stretches   Active Hamstring Stretch Right;3 reps;30 seconds    Knee: Self-Stretch Limitations tailgate stretch for flexion 10 sec X 10 reps    Gastroc Stretch Both;3 reps;30 seconds    Gastroc Stretch Limitations slantoboard    Other Knee/Hip Stretches supine TKE stretch with first 5 min of  vaso      Knee/Hip Exercises: Aerobic   Nustep L4  X8 min UE/LE      Knee/Hip Exercises: Standing   Forward Step Up Right;10 reps;Hand Hold: 1;Step Height: 4"      Knee/Hip Exercises: Seated   Long Arc Quad Right;15 reps    Long Arc Quad Weight 2 lbs.    Sit to Sand 2 sets;5 reps;with UE support      Vasopneumatic   Number Minutes Vasopneumatic  10 minutes    Vasopnuematic Location  Knee    Vasopneumatic Pressure Medium    Vasopneumatic Temperature  34      Manual Therapy   Manual therapy comments Rt knee PROM flexion and extension mobs grade 1-2             PT Short Term Goals - 12/18/19 1531      PT SHORT TERM GOAL #1   Title Pt will be I and compliant with HEP.    Time 4    Period Weeks    Status New    Target Date 01/15/20      PT SHORT TERM GOAL #2   Title Pt will improve FOTO score to 30% funciton    Time 4    Period Weeks    Status New      PT SHORT TERM GOAL #3   Title -      PT SHORT TERM GOAL #4   Title -  PT  Long Term Goals - 12/18/19 1535      PT LONG TERM GOAL #1   Title Pt will improve FOTO score to at least 43% function.    Time 8    Period Weeks    Status New    Target Date 02/12/20      PT LONG TERM GOAL #2   Title Pt will improve Rt knee ROM 0-115 deg to improve funciton.    Time 8    Period Weeks    Status New      PT LONG TERM GOAL #3   Title Pt will improve Rt knee strength to 5/5 MMT to improve function.    Time 8    Period Weeks    Status New      PT LONG TERM GOAL #4   Title Pt will improve 5TSTS to less than 18 seconds    Baseline 33    Time 8    Period Weeks    Status New      PT LONG TERM GOAL #5   Title Pt will be able to ambulate 500' over even/uneven terrain with LRAD at MOD I level    Time 8    Period Weeks    Status New                 Plan - 12/21/19 1134    Clinical Impression Statement Session focused on Rt knee ROM and gentle strength as tolerated. Used vaso at end to reduce pain and edema. Continue POC    Personal Factors and Comorbidities Comorbidity 3+    Comorbidities PMH: afib, CAD,CHF,fibromyalgia,neck fusion, lumbar decompression sx    Examination-Activity Limitations Bend;Carry;Lift;Stand;Squat;Stairs;Sleep;Locomotion Level    Examination-Participation Restrictions Community Activity;Driving;Laundry;Yard Work;Shop;Cleaning    Stability/Clinical Decision Making Evolving/Moderate complexity    Rehab Potential Good    PT Frequency 3x / week   2-3   PT Treatment/Interventions ADLs/Self Care Home Management;Cryotherapy;Electrical Stimulation;Iontophoresis 4mg /ml Dexamethasone;Moist Heat;Ultrasound;Gait training;Stair training;Therapeutic activities;Therapeutic exercise;Balance training;Neuromuscular re-education;Manual techniques;Passive range of motion;Dry needling;Joint Manipulations;Spinal Manipulations;Vasopneumatic Device;Taping    PT Next Visit Plan review and update HEP PRN, Nu step, ROM, manual, light strengthening to tolerance     PT Home Exercise Plan Access Code: [email or web address]    Consulted and Agree with Plan of Care Patient           Patient will benefit from skilled therapeutic intervention in order to improve the following deficits and impairments:  Abnormal gait, Decreased activity tolerance, Decreased mobility, Decreased endurance, Decreased range of motion, Decreased strength, Increased edema, Difficulty walking, Increased fascial restricitons, Increased muscle spasms, Impaired flexibility, Pain, Postural dysfunction, Improper body mechanics  Visit Diagnosis: Acute pain of right knee  Stiffness of right knee, not elsewhere classified  Muscle weakness (generalized)  Other abnormalities of gait and mobility  Localized edema     Problem List Patient Active Problem List   Diagnosis Date Noted   S/P TKR (total knee replacement), 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   Aortic atherosclerosis ([place]) 11/03/2019   Posterior vitreous detachment of right eye 07/15/2019   Posterior vitreous detachment of left eye 07/15/2019   Ophthalmic migraine 07/15/2019   Diabetes mellitus without complication ([place]) 62/83/1517   Recurrent corneal erosion, 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   Raynaud phenomenon 05/06/2019   Trigger finger, left ring finger 03/18/2019   Unilateral primary osteoarthritis, right knee 01/28/2018   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 01/28/2018   Sacral back pain 10/16/2017   Chronic left upper quadrant pain 07/23/2017  Rectal bleeding 07/23/2017   Poor balance 03/14/2017   Right shoulder pain 03/14/2017   Angina pectoris ([place]) 03/22/2016   Hypertensive heart disease 07/07/2015   Essential hypertension 07/07/2015   Plantar fasciitis, left 03/22/2015   Coronary artery disease due to lipid rich plaque 01/05/2015   Chronic diastolic CHF (congestive heart failure), NYHA class 2 ([place]) 01/05/2015   Malignant neoplasm of ascending colon  pT1, pN0, rM0 s/p robotic  colectomy 11/11/2014 11/11/2014   Migraine ([place]) 01/05/2014   VBI (vertebrobasilar insufficiency) 08/22/2012   TIA (transient ischemic attack) 06/27/2012   DJD (degenerative joint disease) 02/02/2011   Varicose veins of legs 06/06/2010   Postherpetic neuralgia ? 01/02/2010   Palpitations 11/23/2008   UTI'S, RECURRENT 09/28/2008   Fibromyalgia 08/15/2007   DM II (diabetes mellitus, type II), w/ neuropathy 05/21/2006   Hyperlipidemia 05/21/2006   Reactive airway disease 01/29/2002    Silvestre Mesi 12/21/2019, 11:39 AM  [facility] Physical Therapy [address] [place], [place], [address] Phone: [phone]   Fax:  [phone]  Name: Samantha [name] MRN: [id number] Date of Birth: September 20, 1940

## 2019-12-21 NOTE — Telephone Encounter (Signed)
Hydrocodone has been sent in to the pharmacy, however, it requires prior auth.  I have submitted for that. Patient requests we send in Tizanidine if possible as her muscles are bothering her and she has taken this before with relief. She is hoping this will help while waiting for hydrocodone approval.  Ok for tizanidine?

## 2019-12-21 NOTE — Telephone Encounter (Signed)
Patient called requesting a refill of hydrocodone. Please send to pharmacy on file. Patient phone number is [phone].

## 2019-12-22 ENCOUNTER — Ambulatory Visit: Payer: Medicare Other | Admitting: Physical Therapy

## 2019-12-22 ENCOUNTER — Encounter: Payer: Self-pay | Admitting: Rehabilitative and Restorative Service Providers"

## 2019-12-22 ENCOUNTER — Other Ambulatory Visit: Payer: Self-pay | Admitting: Orthopaedic Surgery

## 2019-12-22 ENCOUNTER — Ambulatory Visit (INDEPENDENT_AMBULATORY_CARE_PROVIDER_SITE_OTHER): Payer: Medicare Other | Admitting: Rehabilitative and Restorative Service Providers"

## 2019-12-22 DIAGNOSIS — M25561 Pain in right knee: Secondary | ICD-10-CM | POA: Diagnosis not present

## 2019-12-22 DIAGNOSIS — M25661 Stiffness of right knee, not elsewhere classified: Secondary | ICD-10-CM | POA: Diagnosis not present

## 2019-12-22 DIAGNOSIS — R262 Difficulty in walking, not elsewhere classified: Secondary | ICD-10-CM

## 2019-12-22 DIAGNOSIS — R600 Localized edema: Secondary | ICD-10-CM | POA: Diagnosis not present

## 2019-12-22 DIAGNOSIS — M6281 Muscle weakness (generalized): Secondary | ICD-10-CM | POA: Diagnosis not present

## 2019-12-22 MED ORDER — TIZANIDINE HCL 4 MG PO TABS: 4.0000 mg | ORAL_TABLET | Freq: Two times a day (BID) | ORAL | 0 refills | Status: DC | PRN

## 2019-12-22 MED ORDER — HYDROCODONE-ACETAMINOPHEN 7.5-325 MG PO TABS
1.0000 | ORAL_TABLET | ORAL | 0 refills | Status: DC | PRN
Start: 2019-12-22 — End: 2020-04-13

## 2019-12-22 NOTE — Telephone Encounter (Signed)
done

## 2019-12-22 NOTE — Therapy (Signed)
[place], [place], [address] Phone: [phone]   Fax:  [phone]  Physical Therapy Treatment  Patient Details  Name: Samantha [name] MRN: [id number] Date of Birth: 06-May-1940 Referring Provider (PT): Rodell Perna   Encounter Date: 12/22/2019   PT End of Session - 12/22/19 1649    Visit Number 3    Number of Visits 18    Date for PT Re-Evaluation 02/12/20    PT Start Time 1105    PT Stop Time 1145    PT Time Calculation (min) 40 min    Activity Tolerance Patient tolerated treatment well    Behavior During Therapy [facility] for tasks assessed/performed           Past Medical History:  Diagnosis Date  . A-fib ([place])   . Abnormal CT of the chest 2008   last CT4-l 2009:  . No f/u suggested   . Allergy   . Asthma   . CAD (coronary artery disease)    a. Coronary CTA 10/16: Coronary Ca score 211, mod non-obstructive CAD with LM mild plaque (25-50%), mid LAD 50-69%. b. Neg nuc 06/2015.  . Cataract    BILATERAL-REMOVED  . Chronic diastolic CHF (congestive heart failure) ([place])   . Collagen vascular disease ([place])    "arterial sclerosis" per pt  . Complication of anesthesia    trouble waking up  . Fibromyalgia   . GERD (gastroesophageal reflux disease)   . H/O hiatal hernia   . Heart murmur   . Hyperlipidemia   . Hypertension   . Malignant neoplasm of ascending colon ([place]) 2016   Minimally invasive right hemicolectomy to be done   . Neuromuscular disorder (HCC)    FIBROMYALGIA  . Ocular migraine   . OSA (obstructive sleep apnea) 09/2007   dx w/ a sleep study, not on  CPAP  . Osteoarthritis   . Osteoporosis   . Pneumonia    "double" in 2004  . PONV (postoperative nausea and vomiting)   . Reactive airway disease 01/29/2002   dx of pseudoasthma / vcd in 2005 and nl sprirometry History of dyspnea, 2011,  improved after several medications were changed around Question of COPD, disproved July 06, 2009 with nl pft's       . Rheumatoid factor positive   . Shingles 11/2009  . Sleep apnea   . Stroke ([place])   . TIA (transient ischemic attack)    x2 - on Plavix for this  . Torn rotator cuff    right worse than left, both are torn  . Tumor, 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  . Type II diabetes mellitus ([place])   . 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 Vaginal dysplasia     Past Surgical History:  Procedure Laterality Date  . ABDOMINAL HYSTERECTOMY  1980   NO oophorectomy per pt   . ANTERIOR CERVICAL DECOMP/DISCECTOMY FUSION  2001   C 3, C4 and C5 plate and screws  . BREAST BIOPSY Right 1999  . BUNIONECTOMY Left ~ 1977  . CARDIAC CATHETERIZATION     2018 By Dr. Pernell Dupre (done after colon surgery)  . CATARACT EXTRACTION W/ INTRAOCULAR LENS  IMPLANT, BILATERAL  2012  . COLON SURGERY  10/2014  . EYE SURGERY Bilateral    torq lens for cataracts  . LEFT HEART CATH AND CORONARY ANGIOGRAPHY N/A 03/22/2016   Procedure: Left Heart Cath and Coronary Angiography;  Surgeon: Belva Crome, MD;  Location: [facility]  INVASIVE CV LAB;  Service: Cardiovascular;  Laterality: N/A;  . LUMBAR LAMINECTOMY/DECOMPRESSION MICRODISCECTOMY N/A 01/20/2018   Procedure: L4-5 decompression;  Surgeon: Marybelle Killings, MD;  Location: [place];  Service: Orthopedics;  Laterality: N/A;  . THYROIDECTOMY, PARTIAL  1960's  . TOTAL KNEE ARTHROPLASTY Right 12/04/2019   Procedure: RIGHT TOTAL KNEE ARTHROPLASTY;  Surgeon: Marybelle Killings, MD;  Location: [place];  Service: Orthopedics;  Laterality: Right;  RNFA [place]   "Laser surgery for vaginal cancer; followed by chemotherapy" (06/27/2012)    There were no vitals filed for this visit.   Subjective Assessment - 12/22/19 1149    Subjective Samantha [name] is happy with her early objective AROM progress.    Pertinent History PMH: Rt TKA 12/04/19. afib, CAD,CHF,fibromyalgia,neck fusion, lumbar decompression sx    Limitations Walking;House hold activities;Standing;Lifting     How long can you stand comfortably? maybe 6 minutes with RW    Patient Stated Goals reduce pain and improve knee    Currently in Pain? Yes    Pain Score 8     Pain Location Knee    Pain Orientation Right    Pain Descriptors / Indicators Aching;Pressure;Squeezing;Tightness    Pain Type Surgical pain;Chronic pain    Pain Onset 1 to 4 weeks ago    Pain Frequency Constant    Aggravating Factors  Prolonged postures or too much WB    Pain Relieving Factors Rest, ice, pain meds    Effect of Pain on Daily Activities Limited endurance with sit, stand and walk    Multiple Pain Sites No              [facility] PT Assessment - 12/22/19 0001      AROM   Right Knee Extension -2    Right Knee Flexion 98                         [facility] Adult PT Treatment/Exercise - 12/22/19 0001      Neuro Re-ed    Neuro Re-ed Details  Heel to toe balance eyes open and closed/wide stance 4X each  20 seconds      Exercises   Exercises Knee/Hip      Knee/Hip Exercises: Stretches   Knee: Self-Stretch Limitations Tailgate knee flexion AROM 3 minutes      Knee/Hip Exercises: Aerobic   Stationary Bike Seat 6 and 5 for 4 minutes each      Vasopneumatic   Number Minutes Vasopneumatic  10 minutes    Vasopnuematic Location  Knee    Vasopneumatic Pressure Medium    Vasopneumatic Temperature  34      Manual Therapy   Manual Therapy Other (comment)   AAROM overpressure                 PT Education - 12/22/19 1648    Education Details HEP review    Person(s) Educated Patient    Methods Explanation;Demonstration;Verbal cues    Comprehension Verbalized understanding;Returned demonstration;Need further instruction;Verbal cues required            PT Short Term Goals - 12/18/19 1531      PT SHORT TERM GOAL #1   Title Pt will be I and compliant with HEP.    Time 4    Period Weeks    Status New    Target Date 01/15/20      PT SHORT TERM GOAL #2   Title Pt will  improve FOTO score to  30% funciton    Time 4    Period Weeks    Status New      PT SHORT TERM GOAL #3   Title -      PT SHORT TERM GOAL #4   Title -             PT Long Term Goals - 12/18/19 1535      PT LONG TERM GOAL #1   Title Pt will improve FOTO score to at least 43% function.    Time 8    Period Weeks    Status New    Target Date 02/12/20      PT LONG TERM GOAL #2   Title Pt will improve Rt knee ROM 0-115 deg to improve funciton.    Time 8    Period Weeks    Status New      PT LONG TERM GOAL #3   Title Pt will improve Rt knee strength to 5/5 MMT to improve function.    Time 8    Period Weeks    Status New      PT LONG TERM GOAL #4   Title Pt will improve 5TSTS to less than 18 seconds    Baseline 33    Time 8    Period Weeks    Status New      PT LONG TERM GOAL #5   Title Pt will be able to ambulate 500' over even/uneven terrain with LRAD at MOD I level    Time [place] - 12/22/19 1649    Clinical Impression Statement Samantha [name] is making rapid progress with her post-surgical AROM.  Extension is near perfect while flexion is 23 degrres better (AROM) than evaluation.  Flexion AROM, extension strength, balance and gait will be the focus of continued visits.    Personal Factors and Comorbidities Comorbidity 3+    Comorbidities PMH: afib, CAD,CHF,fibromyalgia,neck fusion, lumbar decompression sx    Examination-Activity Limitations Bend;Carry;Lift;Stand;Squat;Stairs;Sleep;Locomotion Level    Examination-Participation Restrictions Community Activity;Driving;Laundry;Yard Work;Shop;Cleaning    Stability/Clinical Decision Making Evolving/Moderate complexity    Rehab Potential Good    PT Frequency 3x / week   2-3   PT Treatment/Interventions ADLs/Self Care Home Management;Cryotherapy;Electrical Stimulation;Iontophoresis 4mg /ml Dexamethasone;Moist Heat;Ultrasound;Gait training;Stair training;Therapeutic activities;Therapeutic  exercise;Balance training;Neuromuscular re-education;Manual techniques;Passive range of motion;Dry needling;Joint Manipulations;Spinal Manipulations;Vasopneumatic Device;Taping    PT Next Visit Plan review and update HEP PRN, Nu step, ROM, manual, light strengthening to tolerance    PT Home Exercise Plan Access Code: [email or web address]    Consulted and Agree with Plan of Care Patient           Patient will benefit from skilled therapeutic intervention in order to improve the following deficits and impairments:  Abnormal gait, Decreased activity tolerance, Decreased mobility, Decreased endurance, Decreased range of motion, Decreased strength, Increased edema, Difficulty walking, Increased fascial restricitons, Increased muscle spasms, Impaired flexibility, Pain, Postural dysfunction, Improper body mechanics  Visit Diagnosis: Difficulty in walking, not elsewhere classified  Localized edema  Muscle weakness (generalized)  Stiffness of right knee, not elsewhere classified  Acute pain of right knee     Problem List Patient Active Problem List   Diagnosis Date Noted  . 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 Aortic atherosclerosis ([place]) 11/03/2019  . Posterior vitreous detachment of right eye 07/15/2019  .  Posterior vitreous detachment of left eye 07/15/2019  . 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 Diabetes mellitus without complication ([place]) 85/03/7739  . 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 Raynaud phenomenon 05/06/2019  . Trigger finger, left ring finger 03/18/2019  . Unilateral primary osteoarthritis, right knee 01/28/2018  . 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 01/28/2018  . Sacral back pain 10/16/2017  . Chronic left upper quadrant pain 07/23/2017  . Rectal bleeding 07/23/2017  . Poor balance 03/14/2017  . Right shoulder pain 03/14/2017  . Angina pectoris ([place]) 03/22/2016  . Hypertensive heart disease 07/07/2015  .  Essential hypertension 07/07/2015  . Plantar fasciitis, left 03/22/2015  . Coronary artery disease due to lipid rich plaque 01/05/2015  . Chronic diastolic CHF (congestive heart failure), NYHA class 2 ([place]) 01/05/2015  . 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 Migraine ([place]) 01/05/2014  . VBI (vertebrobasilar insufficiency) 08/22/2012  . TIA (transient ischemic attack) 06/27/2012  . DJD (degenerative joint disease) 02/02/2011  . 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 Postherpetic neuralgia ? 01/02/2010  . Palpitations 11/23/2008  . UTI'S, RECURRENT 09/28/2008  . Fibromyalgia 08/15/2007  . 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 Hyperlipidemia 05/21/2006  . Reactive airway disease 01/29/2002    Farley Ly PT, MPT 12/22/2019, 4:52 PM  [facility] Physical Therapy [address] [place], [place], [address] Phone: [phone]   Fax:  [phone]  Name: Samantha [name] MRN: [id number] Date of Birth: 02-02-1940

## 2019-12-22 NOTE — Progress Notes (Signed)
Pharmacy said did not have Rx from 11/19 requested resend which I did. norco 7.5/325

## 2019-12-22 NOTE — Telephone Encounter (Signed)
I called and spoke with pharmacy to check on status of prior auth. They are unable to even find the hydrocodone in her profile and are unsure of what happened to it. They asked if you would please resubmit and type in the comments that she is post op and it is ok to fill now.'  Tizanidine sent in as well. Thanks.  [facility]  [employer]

## 2019-12-22 NOTE — Telephone Encounter (Signed)
How can they require pre-authorization for post op pain medication ?? This is pt safety issue. OK to send in tizanadine # 40 one PO bid prn thanks. If u have a phone # for authorization  for Norco I will happily call .

## 2019-12-23 ENCOUNTER — Encounter: Payer: Medicare Other | Admitting: Rehabilitative and Restorative Service Providers"

## 2019-12-23 NOTE — Telephone Encounter (Signed)
I left voicemail for patient requesting return call. I would like to be sure that the pharmacy was able to dispense her pain medication.

## 2019-12-23 NOTE — Telephone Encounter (Signed)
I left another voicemail for patient to see if she was able to get medication from the pharmacy. Will wait for return call.

## 2019-12-29 ENCOUNTER — Telehealth: Payer: Self-pay | Admitting: Internal Medicine

## 2019-12-29 ENCOUNTER — Encounter (HOSPITAL_COMMUNITY): Payer: Self-pay | Admitting: Emergency Medicine

## 2019-12-29 ENCOUNTER — Emergency Department (HOSPITAL_COMMUNITY): Payer: Medicare Other

## 2019-12-29 ENCOUNTER — Other Ambulatory Visit: Payer: Self-pay

## 2019-12-29 ENCOUNTER — Telehealth: Payer: Self-pay | Admitting: Orthopaedic Surgery

## 2019-12-29 ENCOUNTER — Telehealth: Payer: Self-pay

## 2019-12-29 ENCOUNTER — Emergency Department (HOSPITAL_COMMUNITY)
Admission: EM | Admit: 2019-12-29 | Discharge: 2019-12-29 | Disposition: A | Discharge status: Home / Self Care | Payer: Medicare Other | Attending: Emergency Medicine | Admitting: Emergency Medicine

## 2019-12-29 DIAGNOSIS — J45909 Unspecified asthma, uncomplicated: Secondary | ICD-10-CM | POA: Diagnosis not present

## 2019-12-29 DIAGNOSIS — I110 Hypertensive heart disease with heart failure: Secondary | ICD-10-CM | POA: Diagnosis not present

## 2019-12-29 DIAGNOSIS — Z8544 Personal history of malignant neoplasm of other female genital organs: Secondary | ICD-10-CM | POA: Diagnosis not present

## 2019-12-29 DIAGNOSIS — I2583 Coronary atherosclerosis due to lipid rich plaque: Secondary | ICD-10-CM | POA: Diagnosis not present

## 2019-12-29 DIAGNOSIS — Z7982 Long term (current) use of aspirin: Secondary | ICD-10-CM | POA: Diagnosis not present

## 2019-12-29 DIAGNOSIS — B9789 Other viral agents as the cause of diseases classified elsewhere: Secondary | ICD-10-CM | POA: Diagnosis not present

## 2019-12-29 DIAGNOSIS — Z7984 Long term (current) use of oral hypoglycemic drugs: Secondary | ICD-10-CM | POA: Insufficient documentation

## 2019-12-29 DIAGNOSIS — E1169 Type 2 diabetes mellitus with other specified complication: Secondary | ICD-10-CM | POA: Insufficient documentation

## 2019-12-29 DIAGNOSIS — J069 Acute upper respiratory infection, unspecified: Secondary | ICD-10-CM

## 2019-12-29 DIAGNOSIS — Z85038 Personal history of other malignant neoplasm of large intestine: Secondary | ICD-10-CM | POA: Insufficient documentation

## 2019-12-29 DIAGNOSIS — Z87891 Personal history of nicotine dependence: Secondary | ICD-10-CM | POA: Diagnosis not present

## 2019-12-29 DIAGNOSIS — Z96651 Presence of right artificial knee joint: Secondary | ICD-10-CM | POA: Insufficient documentation

## 2019-12-29 DIAGNOSIS — I4891 Unspecified atrial fibrillation: Secondary | ICD-10-CM | POA: Insufficient documentation

## 2019-12-29 DIAGNOSIS — R0600 Dyspnea, unspecified: Secondary | ICD-10-CM | POA: Diagnosis not present

## 2019-12-29 DIAGNOSIS — E1139 Type 2 diabetes mellitus with other diabetic ophthalmic complication: Secondary | ICD-10-CM | POA: Diagnosis not present

## 2019-12-29 DIAGNOSIS — Z7951 Long term (current) use of inhaled steroids: Secondary | ICD-10-CM | POA: Diagnosis not present

## 2019-12-29 DIAGNOSIS — I5022 Chronic systolic (congestive) heart failure: Secondary | ICD-10-CM | POA: Diagnosis not present

## 2019-12-29 DIAGNOSIS — E1140 Type 2 diabetes mellitus with diabetic neuropathy, unspecified: Secondary | ICD-10-CM | POA: Insufficient documentation

## 2019-12-29 DIAGNOSIS — Z79899 Other long term (current) drug therapy: Secondary | ICD-10-CM | POA: Insufficient documentation

## 2019-12-29 DIAGNOSIS — N390 Urinary tract infection, site not specified: Secondary | ICD-10-CM | POA: Diagnosis not present

## 2019-12-29 DIAGNOSIS — N179 Acute kidney failure, unspecified: Secondary | ICD-10-CM | POA: Insufficient documentation

## 2019-12-29 DIAGNOSIS — I2510 Atherosclerotic heart disease of native coronary artery without angina pectoris: Secondary | ICD-10-CM | POA: Diagnosis not present

## 2019-12-29 DIAGNOSIS — Z91040 Latex allergy status: Secondary | ICD-10-CM | POA: Diagnosis not present

## 2019-12-29 DIAGNOSIS — Z85850 Personal history of malignant neoplasm of thyroid: Secondary | ICD-10-CM | POA: Insufficient documentation

## 2019-12-29 DIAGNOSIS — R0602 Shortness of breath: Secondary | ICD-10-CM | POA: Diagnosis not present

## 2019-12-29 DIAGNOSIS — E785 Hyperlipidemia, unspecified: Secondary | ICD-10-CM | POA: Insufficient documentation

## 2019-12-29 DIAGNOSIS — I2699 Other pulmonary embolism without acute cor pulmonale: Secondary | ICD-10-CM | POA: Diagnosis not present

## 2019-12-29 DIAGNOSIS — R7989 Other specified abnormal findings of blood chemistry: Secondary | ICD-10-CM | POA: Diagnosis not present

## 2019-12-29 DIAGNOSIS — Z20822 Contact with and (suspected) exposure to covid-19: Secondary | ICD-10-CM | POA: Diagnosis not present

## 2019-12-29 LAB — CBC
HCT: 37.7 % (ref 36.0–46.0)
Hemoglobin: 12 g/dL (ref 12.0–15.0)
MCH: 28.1 pg (ref 26.0–34.0)
MCHC: 31.8 g/dL (ref 30.0–36.0)
MCV: 88.3 fL (ref 80.0–100.0)
Platelets: 222 10*3/uL (ref 150–400)
RBC: 4.27 MIL/uL (ref 3.87–5.11)
RDW: 15.1 % (ref 11.5–15.5)
WBC: 4.3 10*3/uL (ref 4.0–10.5)
nRBC: 0 % (ref 0.0–0.2)

## 2019-12-29 LAB — BASIC METABOLIC PANEL
Anion gap: 14 (ref 5–15)
BUN: 29 mg/dL — ABNORMAL HIGH (ref 8–23)
CO2: 25 mmol/L (ref 22–32)
Calcium: 9.2 mg/dL (ref 8.9–10.3)
Chloride: 100 mmol/L (ref 98–111)
Creatinine, Ser: 1.63 mg/dL — ABNORMAL HIGH (ref 0.44–1.00)
GFR, Estimated: 32 mL/min — ABNORMAL LOW (ref >60)
Glucose, Bld: 112 mg/dL — ABNORMAL HIGH (ref 70–99)
Potassium: 3.9 mmol/L (ref 3.5–5.1)
Sodium: 139 mmol/L (ref 135–145)

## 2019-12-29 LAB — RESP PANEL BY RT-PCR (FLU A&B, COVID) ARPGX2
Influenza A by PCR: NEGATIVE
Influenza B by PCR: NEGATIVE
SARS Coronavirus 2 by RT PCR: NEGATIVE

## 2019-12-29 LAB — TROPONIN I (HIGH SENSITIVITY)
Troponin I (High Sensitivity): 4 ng/L (ref <18)
Troponin I (High Sensitivity): <2 ng/L (ref <18)

## 2019-12-29 LAB — BRAIN NATRIURETIC PEPTIDE: B Natriuretic Peptide: 16.7 pg/mL (ref 0.0–100.0)

## 2019-12-29 LAB — D-DIMER, QUANTITATIVE: D-Dimer, Quant: 5.43 ug/mL-FEU — ABNORMAL HIGH (ref 0.00–0.50)

## 2019-12-29 MED ORDER — PREDNISONE 10 MG PO TABS: 40.0000 mg | ORAL_TABLET | Freq: Every day | ORAL | 0 refills | Status: AC

## 2019-12-29 MED ORDER — IOHEXOL 350 MG/ML SOLN
50.0000 mL | Freq: Once | INTRAVENOUS | Status: AC | PRN
  Administered 2019-12-29: 50 mL via INTRAVENOUS

## 2019-12-29 MED ORDER — SODIUM CHLORIDE 0.9 % IV BOLUS
1000.0000 mL | Freq: Once | INTRAVENOUS | Status: AC
  Administered 2019-12-29: 1000 mL via INTRAVENOUS

## 2019-12-29 MED ORDER — PREDNISONE 20 MG PO TABS
40.0000 mg | ORAL_TABLET | Freq: Once | ORAL | Status: AC
  Administered 2019-12-29: 40 mg via ORAL
  Filled 2019-12-29: qty 2

## 2019-12-29 MED ORDER — IPRATROPIUM-ALBUTEROL 0.5-2.5 (3) MG/3ML IN SOLN
3.0000 mL | Freq: Once | RESPIRATORY_TRACT | Status: AC
  Administered 2019-12-29: 3 mL via RESPIRATORY_TRACT
  Filled 2019-12-29: qty 3

## 2019-12-29 NOTE — ED Provider Notes (Signed)
[facility] EMERGENCY DEPARTMENT Provider Note   CSN: [id number] Arrival date & time: 12/29/19  1356     History Chief Complaint  Patient presents with  . Shortness of Breath    Samantha [name] is a 79 y.o. female with a past medical history of A. fib, CAD, CHF with an EF of 60 to 65% seen on echo last month, GERD, hypertension, prior stroke, diabetes presenting to the ED with a chief complaint of shortness of breath.  For the past 3 days has been having shortness of breath, left-sided lower chest pain, dry cough and wheezing.  Symptoms worsened yesterday and today.  She has a history of reactive airway disease and states that typically her symptoms will improve with her inhaler.  She tried using her inhaler but "it to the opposite and it made me feel worse."  She reports having chills yesterday.  Has not tried any medications to help with her symptoms.  States that she will intermittently get pain in her abdomen from "lymph nodes from my cancer surgery years ago."  Reports nausea but denies any vomiting.  Denies any significant leg swelling.  No sick contacts with similar symptoms.  Denies any changes to bowel movements or urination, back pain, hemoptysis, history of DVT or PE.  She is currently on aspirin and Plavix.  HPI     Past Medical History:  Diagnosis Date  . A-fib ([place])   . Abnormal CT of the chest 2008   last CT4-l 2009:  . No f/u suggested   . Allergy   . Asthma   . CAD (coronary artery disease)    a. Coronary CTA 10/16: Coronary Ca score 211, mod non-obstructive CAD with LM mild plaque (25-50%), mid LAD 50-69%. b. Neg nuc 06/2015.  . Cataract    BILATERAL-REMOVED  . Chronic diastolic CHF (congestive heart failure) ([place])   . Collagen vascular disease ([place])    "arterial sclerosis" per pt  . Complication of anesthesia    trouble waking up  . Fibromyalgia   . GERD (gastroesophageal reflux disease)   . H/O hiatal hernia   . Heart murmur   .  Hyperlipidemia   . Hypertension   . 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 Neuromuscular disorder (H[facility])    FIBROMYALGIA  . Ocular migraine   . OSA (obstructive sleep apnea) 09/2007   dx w/ a sleep study, not on  CPAP  . Osteoarthritis   . Osteoporosis   . Pneumonia    "double" in 2004  . PONV (postoperative nausea and vomiting)   . Reactive airway disease 01/29/2002   dx of pseudoasthma / vcd in 2005 and nl sprirometry History of dyspnea, 2011,  improved after several medications were changed around Question of COPD, disproved July 06, 2009 with nl pft's      . Rheumatoid factor positive   . Shingles 11/2009  . Sleep apnea   . Stroke ([place])   . TIA (transient ischemic attack)    x2 - on Plavix for this  . Torn rotator cuff    right worse than left, both are torn  . Tumor, 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  . Type II diabetes mellitus ([place])   . 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 Vaginal dysplasia     Patient Active Problem List   Diagnosis Date Noted  . S/P TKR (total knee replacement), right  12/04/2019  . Aortic atherosclerosis ([place]) 11/03/2019  . Posterior vitreous detachment of right eye 07/15/2019  . Posterior vitreous detachment of left eye 07/15/2019  . 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 Diabetes mellitus without complication ([place]) 57/32/2025  . 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 Raynaud phenomenon 05/06/2019  . Trigger finger, left ring finger 03/18/2019  . Unilateral primary osteoarthritis, right knee 01/28/2018  . 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 01/28/2018  . Sacral back pain 10/16/2017  . Chronic left upper quadrant pain 07/23/2017  . Rectal bleeding 07/23/2017  . Poor balance 03/14/2017  . Right shoulder pain 03/14/2017  . Angina pectoris ([place]) 03/22/2016  . Hypertensive heart disease 07/07/2015  . Essential hypertension 07/07/2015  .  Plantar fasciitis, left 03/22/2015  . Coronary artery disease due to lipid rich plaque 01/05/2015  . Chronic diastolic CHF (congestive heart failure), NYHA class 2 ([place]) 01/05/2015  . 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 Migraine (Ocular) 01/05/2014  . VBI (vertebrobasilar insufficiency) 08/22/2012  . TIA (transient ischemic attack) 06/27/2012  . DJD (degenerative joint disease) 02/02/2011  . 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 Postherpetic neuralgia ? 01/02/2010  . Palpitations 11/23/2008  . UTI'S, RECURRENT 09/28/2008  . Fibromyalgia 08/15/2007  . 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 Hyperlipidemia 05/21/2006  . Reactive airway disease 01/29/2002    Past Surgical History:  Procedure Laterality Date  . ABDOMINAL HYSTERECTOMY  1980   NO oophorectomy per pt   . ANTERIOR CERVICAL DECOMP/DISCECTOMY FUSION  2001   C 3, C4 and C5 plate and screws  . BREAST BIOPSY Right 1999  . BUNIONECTOMY Left ~ 1977  . CARDIAC CATHETERIZATION     2018 By Dr. Pernell Dupre (done after colon surgery)  . CATARACT EXTRACTION W/ INTRAOCULAR LENS  IMPLANT, BILATERAL  2012  . COLON SURGERY  10/2014  . EYE SURGERY Bilateral    torq lens for cataracts  . LEFT HEART CATH AND CORONARY ANGIOGRAPHY N/A 03/22/2016   Procedure: Left Heart Cath and Coronary Angiography;  Surgeon: Belva Crome, MD;  Location: [facility];  Service: Cardiovascular;  Laterality: N/A;  . LUMBAR LAMINECTOMY/DECOMPRESSION MICRODISCECTOMY N/A 01/20/2018   Procedure: L4-5 decompression;  Surgeon: Marybelle Killings, MD;  Location: [facility];  Service: Orthopedics;  Laterality: N/A;  . THYROIDECTOMY, PARTIAL  1960's  . TOTAL KNEE ARTHROPLASTY Right 12/04/2019   Procedure: RIGHT TOTAL KNEE ARTHROPLASTY;  Surgeon: Marybelle Killings, MD;  Location: [facility];  Service: Orthopedics;  Laterality: Right;  RNFA [place]   "Laser  surgery for vaginal cancer; followed by chemotherapy" (06/27/2012)     OB History    Gravida  2   Para      Term      Preterm      AB      Living        SAB      TAB      Ectopic      Multiple      Live Births              Family History  Problem Relation Age of Onset  . Heart disease Father   . Heart disease Mother   . Lung cancer Mother   . Allergies Sister   . Parkinsonism Sister        possible  .  Asthma Sister   . Asthma Paternal Grandmother   . Stroke Paternal Grandmother   . Heart disease Other        paternal grandparents, maternal grandparents,   . Heart disease Brother   . Emphysema Brother   . Aneurysm Brother        x3  . Kidney failure Brother   . Diabetes Brother   . Diabetes Brother   . Stroke Brother   . Stroke Maternal Grandmother   . Breast cancer Neg Hx   . Colon cancer Neg Hx   . Heart attack Neg Hx   . Esophageal cancer Neg Hx   . Rectal cancer Neg Hx   . Stomach cancer Neg Hx     Social History   Tobacco Use  . Smoking status: Former Smoker    Packs/day: 0.25    Years: 5.00    Pack years: 1.25    Types: Cigarettes    Quit date: 01/29/1998    Years since quitting: 21.9  . Smokeless tobacco: Never Used  . Tobacco comment: Quit in 2001  Vaping Use  . Vaping Use: Never used  Substance Use Topics  . Alcohol use: No    Alcohol/week: 0.0 standard drinks  . Drug use: No    Home Medications Prior to Admission medications   Medication Sig Start Date End Date Taking? Authorizing Provider  acetaminophen (TYLENOL) 500 MG tablet Take 500 mg by mouth every 6 (six) hours as needed for mild pain.    Yes [provider]  albuterol (VENTOLIN HFA) 108 (90 Base) MCG/ACT inhaler Inhale 2 puffs into the lungs every 6 (six) hours as needed for wheezing or shortness of breath. 11/04/19  Yes Burns, Claudina Lick, MD  aspirin (BAYER ASPIRIN) 325 MG tablet Take 1 tablet (325 mg total) by mouth daily. Take for 4 wks then stop Patient  taking differently: Take 325 mg by mouth daily.  12/06/19  Yes Marybelle Killings, MD  budesonide-formoterol (SYMBICORT) 80-4.5 MCG/ACT inhaler Inhale 2 puffs into the lungs 2 (two) times daily. 11/04/19  Yes Burns, Claudina Lick, MD  BYSTOLIC 10 MG tablet TAKE 1 TABLET(10 MG) BY MOUTH DAILY Patient taking differently: Take 10 mg by mouth daily.  10/09/19  Yes [name], MD  Calcium-Magnesium-Zinc (CAL-MAG-ZINC PO) Take 1 tablet by mouth daily.   Yes [provider]  cetirizine (ZYRTEC) 10 MG tablet TAKE 1 TABLET(10 MG) BY MOUTH DAILY Patient taking differently: Take 10 mg by mouth daily.  08/27/19  Yes Burns, Claudina Lick, MD  cholecalciferol (VITAMIN D) 1000 UNITS tablet Take 1,000 Units by mouth every morning.    Yes [provider]  cholestyramine (QUESTRAN) 4 g packet Take 1 packet (4 g total) by mouth daily. 02/17/19  Yes Milus Banister, MD  clopidogrel (PLAVIX) 75 MG tablet TAKE 1 TABLET(75 MG) BY MOUTH DAILY Patient taking differently: Take 75 mg by mouth daily.  08/27/19  Yes Burns, Claudina Lick, MD  Evolocumab (REPATHA SURECLICK) 841 MG/ML SOAJ Inject 1 pen into the skin every 14 (fourteen) days. 03/25/19  Yes [name], MD  HYDROcodone-acetaminophen (NORCO) 7.5-325 MG tablet Take 1-2 tablets by mouth every 4 (four) hours as needed for severe pain (pain score 7-10). 12/22/19  Yes Marybelle Killings, MD  hydroxychloroquine (PLAQUENIL) 200 MG tablet Take 400 mg by mouth daily.    Yes [provider]  isosorbide mononitrate (IMDUR) 60 MG 24 hr tablet TAKE 1 TABLET(60 MG) BY MOUTH DAILY Patient taking differently:  Take 60 mg by mouth daily.  12/18/19  Yes [name], MD  losartan (COZAAR) 25 MG tablet Take 1 tablet (25 mg total) by mouth daily. 11/11/19  Yes Imogene Burn, PA-C  metFORMIN (GLUCOPHAGE-XR) 500 MG 24 hr tablet TAKE 1 TABLET(500 MG) BY MOUTH DAILY WITH BREAKFAST Patient taking differently: Take 500 mg by mouth daily with breakfast.  07/09/19  Yes Burns,  Claudina Lick, MD  Polyethyl Glycol-Propyl Glycol (SYSTANE) 0.4-0.3 % GEL Place 1 application into both eyes daily as needed (for dry eyes).    Yes [provider]  Potassium 99 MG TABS Take 99 mg by mouth daily.    Yes [provider]  pregabalin (LYRICA) 75 MG capsule TAKE 1 CAPSULE(75 MG) BY MOUTH AT BEDTIME Patient taking differently: Take 75 mg by mouth at bedtime.  12/01/19  Yes Burns, Claudina Lick, MD  torsemide (DEMADEX) 20 MG tablet TAKE 1 TABLET(20 MG) BY MOUTH DAILY Patient taking differently: Take 20 mg by mouth daily.  09/29/19  Yes [name], MD  predniSONE (DELTASONE) 10 MG tablet Take 4 tablets (40 mg total) by mouth daily for 3 days. 12/29/19 01/01/20  Vinton Layson, PA-C  tiZANidine (ZANAFLEX) 4 MG tablet Take 1 tablet (4 mg total) by mouth 2 (two) times daily as needed for muscle spasms. 12/22/19   Marybelle Killings, MD    Allergies    Bactrim [sulfamethoxazole-trimethoprim], Cefuroxime axetil, Oxycodone, Pravastatin, Seldane [terfenadine], Zocor [simvastatin], Tramadol, Atorvastatin, Latex, Lime flavor [flavoring agent], Rosuvastatin, and Tape  Review of Systems   Review of Systems  Constitutional: Negative for appetite change, chills and fever.  HENT: Negative for ear pain, rhinorrhea, sneezing and sore throat.   Eyes: Negative for photophobia and visual disturbance.  Respiratory: Positive for cough, shortness of breath and wheezing. Negative for chest tightness.   Cardiovascular: Negative for chest pain and palpitations.  Gastrointestinal: Positive for nausea. Negative for abdominal pain, blood in stool, constipation, diarrhea and vomiting.  Genitourinary: Negative for dysuria, hematuria and urgency.  Musculoskeletal: Negative for myalgias.  Skin: Negative for rash.  Neurological: Negative for dizziness, weakness and light-headedness.    Physical Exam Updated Vital Signs BP 130/61   Pulse 82   Temp 98.6 F (37 C) (Oral)   Resp 18   SpO2 100%    Physical Exam Vitals and nursing note reviewed.  Constitutional:      General: She is not in acute distress.    Appearance: She is well-developed.     Comments: Speaking in complete sentences without difficulty.  No signs of respiratory distress.  HENT:     Head: Normocephalic and atraumatic.     Nose: Nose normal.  Eyes:     General: No scleral icterus.       Left eye: No discharge.     Conjunctiva/sclera: Conjunctivae normal.  Cardiovascular:     Rate and Rhythm: Normal rate and regular rhythm.     Heart sounds: Normal heart sounds. No murmur heard.  No friction rub. No gallop.   Pulmonary:     Effort: Pulmonary effort is normal. No respiratory distress.     Breath sounds: Normal breath sounds.  Chest:     Chest wall: Tenderness present.    Abdominal:     General: Bowel sounds are normal. There is no distension.     Palpations: Abdomen is soft.     Tenderness: There is no abdominal tenderness. There is no guarding.  Musculoskeletal:  General: Normal range of motion.     Cervical back: Normal range of motion and neck supple.  Skin:    General: Skin is warm and dry.     Findings: No rash.     Comments: No calf tenderness bilaterally.  No erythema or edema of bilateral lower extremities.  Neurological:     Mental Status: She is alert.     Motor: No abnormal muscle tone.     Coordination: Coordination normal.     ED Results / Procedures / Treatments   Labs (all labs ordered are listed, but only abnormal results are displayed) Labs Reviewed  BASIC METABOLIC PANEL - Abnormal; Notable for the following components:      Result Value   Glucose, Bld 112 (*)    BUN 29 (*)    Creatinine, Ser 1.63 (*)    GFR, Estimated 32 (*)    All other components within normal limits  D-DIMER, QUANTITATIVE (NOT AT [facility]) - Abnormal; Notable for the following components:   D-Dimer, Quant 5.43 (*)    All other components within normal limits  RESP PANEL BY RT-PCR (FLU A&B,  COVID) ARPGX2  CBC  BRAIN NATRIURETIC PEPTIDE  TROPONIN I (HIGH SENSITIVITY)  TROPONIN I (HIGH SENSITIVITY)    EKG EKG Interpretation  Date/Time:  Tuesday December 29 2019 14:04:26 EST Ventricular Rate:  87 PR Interval:  198 QRS Duration: 98 QT Interval:  384 QTC Calculation: 462 R Axis:   -58 Text Interpretation: Normal sinus rhythm Left axis deviation Pulmonary disease pattern Abnormal ECG Confirmed by Dewaine Conger [phone]) on 12/29/2019 7:44:41 PM   Radiology DG Chest 2 View  Result Date: 12/29/2019 CLINICAL DATA:  Dyspnea. EXAM: CHEST - 2 VIEW COMPARISON:  November 27, 2019. FINDINGS: The heart size and mediastinal contours are within normal limits. Both lungs are clear. No pneumothorax or pleural effusion is noted. The visualized skeletal structures are unremarkable. IMPRESSION: No active cardiopulmonary disease. Electronically Signed   By: Marijo Conception M.D.   On: 12/29/2019 14:50   CT Angio Chest PE W/Cm &/Or Wo Cm  Result Date: 12/29/2019 CLINICAL DATA:  History of recent knee surgery with shortness of breath and positive D-dimer. EXAM: CT ANGIOGRAPHY CHEST WITH CONTRAST TECHNIQUE: Multidetector CT imaging of the chest was performed using the standard protocol during bolus administration of intravenous contrast. Multiplanar CT image reconstructions and MIPs were obtained to evaluate the vascular anatomy. CONTRAST:  16mL OMNIPAQUE IOHEXOL 350 MG/ML SOLN COMPARISON:  07/26/2015 FINDINGS: Cardiovascular: Atherosclerotic calcifications of the thoracic aorta and its branches are noted. No aneurysmal dilatation or dissection is seen. No cardiac enlargement is noted. Coronary calcifications are noted. Pulmonary artery is well visualized within normal branching pattern. No definitive filling defects to suggest pulmonary emboli are noted. Mediastinum/Nodes: Thoracic inlet is within normal limits. No sizable hilar or mediastinal adenopathy is noted. The esophagus as visualized is within  normal limits. Lungs/Pleura: Lungs are well aerated bilaterally. Minimal bibasilar atelectatic changes are seen. No focal infiltrate or effusion is noted. No pneumothorax is seen. Upper Abdomen: Visualized upper abdomen shows no acute abnormality. Musculoskeletal: Osseous structures demonstrate postsurgical changes in the cervical spine incompletely evaluated on this exam. The thoracic spine demonstrates multilevel degenerative change. No acute rib abnormality is noted. No compression deformity is seen. Review of the MIP images confirms the above findings. IMPRESSION: No evidence of pulmonary emboli. Minimal bibasilar atelectatic changes. Aortic Atherosclerosis (ICD10-I70.0). Electronically Signed   By: Inez Catalina M.D.   On: 12/29/2019 19:25  Procedures Procedures (including critical care time)  Medications Ordered in ED Medications  sodium chloride 0.9 % bolus 1,000 mL (0 mLs Intravenous Stopped 12/29/19 2045)  iohexol (OMNIPAQUE) 350 MG/ML injection 50 mL (50 mLs Intravenous Contrast Given 12/29/19 1848)  ipratropium-albuterol (DUONEB) 0.5-2.5 (3) MG/3ML nebulizer solution 3 mL (3 mLs Nebulization Given 12/29/19 2057)  predniSONE (DELTASONE) tablet 40 mg (40 mg Oral Given 12/29/19 2056)    ED Course  I have reviewed the triage vital signs and the nursing notes.  Pertinent labs & imaging results that were available during my care of the patient were reviewed by me and considered in my medical decision making (see chart for details).  Clinical Course as of Dec 28 2216  Tue Dec 29, 2019  1603 WBC: 4.3 [HK]  1603 No acute findings.  DG Chest 2 View [HK]  1603 Higher than baseline around 1.  Creatinine(!): 1.63 [HK]  1656 Will order CT angio to rule out PE.  D-Dimer, Quant(!): 5.43 [HK]  1731 SARS Coronavirus 2 by RT PCR: NEGATIVE [HK]  1731 B Natriuretic Peptide: 16.7 [HK]    Clinical Course User Index [HK] Delia Heady, PA-C   MDM Rules/Calculators/A&P                            Samantha [name] was evaluated in Emergency Department on 12/29/19  for the symptoms described in the history of present illness. He/she was evaluated in the context of the global COVID-19 pandemic, which necessitated consideration that the patient might be at risk for infection with the SARS-CoV-2 virus that causes COVID-19. Institutional protocols and algorithms that pertain to the evaluation of patients at risk for COVID-19 are in a state of rapid change based on information released by regulatory bodies including the CDC and federal and state organizations. These policies and algorithms were followed during the patient's care in the ED.  79 year old female with a past medical history of A. fib, CAD, CHF with an EF of 60 to 65% seen on echo last month, GERD, hypertension, prior stroke and diabetes presenting to the ED with a chief complaint of shortness of breath. 3-day history of shortness of breath, left-sided lower chest pain, dry cough and wheezing. Symptoms worsened yesterday and today. Reports history of reactive airway disease but no improvement noted with her home inhalers. She has been compliant with her aspirin and Plavix. Denies any changes to bowel movements or urination, back pain, history of DVT or PE. On exam no significant lower extremity edema or calf tenderness noted. Lungs are clear to auscultation bilaterally. She has no signs of respiratory distress or airway compromise. EKG shows sinus rhythm, pulmonary disease pattern without evidence of STEMI. Chest x-ray without any acute findings. COVID test is negative. Lab work including troponin initial and delta are both negative. BNP is unremarkable. CBC unremarkable. BMP with elevation creatinine which she was given IV fluids for. D-dimer significantly elevated at 5.4. CT angio to rule out PE was done. This was negative for PE and any other acute abnormalities. No evidence of pneumonia. Suspect that her symptoms may be viral in nature or  could be a flareup of her reactive airway disease. She was given a breathing treatment and steroids here with improvement in her symptoms. She ambulated here without difficulty and oxygen saturations remained above 96% on room air. Will treat with remainder of steroid course at home and have her continue her home inhalers. She remains in no  acute distress, not hypoxic or tachycardic. We will have her return for worsening symptoms.   Patient is hemodynamically stable, in NAD, and able to ambulate in the ED. Evaluation does not show pathology that would require ongoing emergent intervention or inpatient treatment. I explained the diagnosis to the patient. Pain has been managed and has no complaints prior to discharge. Patient is comfortable with above plan and is stable for discharge at this time. All questions were answered prior to disposition. Strict return precautions for returning to the ED were discussed. Encouraged follow up with PCP.   An After Visit Summary was printed and given to the patient.   Portions of this note were generated with [employer]. Dictation errors may occur despite best attempts at proofreading.  Final Clinical Impression(s) / ED Diagnoses Final diagnoses:  Viral URI with cough  AKI (acute kidney injury) ([place])    Rx / DC Orders ED Discharge Orders         Ordered    predniSONE (DELTASONE) 10 MG tablet  Daily        12/29/19 2147           Delia Heady, PA-C 12/29/19 2232    Breck Coons, MD 12/29/19 2235

## 2019-12-29 NOTE — ED Triage Notes (Signed)
Pt reports sob since Sunday, worse yesterday. Had recent R knee surgery a few weeks ago. Denies chest pain.

## 2019-12-29 NOTE — Discharge Instructions (Signed)
Take the steroids beginning tomorrow. Continue your home inhalers. Your kidney labs appear to be gradually elevating. You will need to speak to your primary care provider regarding this. Return to the ER if you start to experience worsening chest pain, shortness of breath or wheezing, leg swelling, vomiting or coughing up blood.

## 2019-12-29 NOTE — Telephone Encounter (Signed)
    Patient calling to report wheezing, SOB and cough  Call transferred to [facility]

## 2019-12-29 NOTE — Telephone Encounter (Signed)
[facility] talked to patient   Patient states she has SOB ,wheezing, and cough, started 2 days ago. Recently had a knee replacement and has been using an incentive spirometer, has not been able to tolerate it for the past 2 days.   [facility] advised to Go to ED now. Patient understood and complied.

## 2019-12-29 NOTE — ED Notes (Signed)
Pt ambulated well with steady gait, SPO2 remained 97-100% on RA. Pt denies SOB, respirations even and unlabored.

## 2019-12-29 NOTE — Telephone Encounter (Signed)
Patient called stating that she was having trouble breathing, sweating, and shortness of breath.  Stated that she was advised to go to the ED by Dr. Quay Burow office.   Spoke with Benjiman Core, PA and he advised that patient go to the ED immediately for evaluation.  Patient voiced that she understands.

## 2019-12-30 ENCOUNTER — Encounter: Payer: Medicare Other | Admitting: Physical Therapy

## 2019-12-31 ENCOUNTER — Other Ambulatory Visit: Payer: Self-pay | Admitting: Internal Medicine

## 2020-01-01 ENCOUNTER — Telehealth: Payer: Self-pay

## 2020-01-01 ENCOUNTER — Encounter: Payer: Medicare Other | Admitting: Rehabilitative and Restorative Service Providers"

## 2020-01-01 ENCOUNTER — Telehealth: Payer: Self-pay | Admitting: Rehabilitative and Restorative Service Providers"

## 2020-01-01 MED ORDER — TIZANIDINE HCL 4 MG PO TABS
4.0000 mg | ORAL_TABLET | Freq: Two times a day (BID) | ORAL | 0 refills | Status: DC | PRN
Start: 2020-01-01 — End: 2020-02-12

## 2020-01-01 NOTE — Telephone Encounter (Signed)
Sent to pharmacy 

## 2020-01-01 NOTE — Telephone Encounter (Signed)
Patient called she is requesting rx refill for tizanidine CB:[phone]

## 2020-01-01 NOTE — Telephone Encounter (Signed)
Please advise 

## 2020-01-01 NOTE — Telephone Encounter (Signed)
Ok thx.

## 2020-01-01 NOTE — Addendum Note (Signed)
Addended by: Meyer Cory on: 01/01/2020 05:18 PM   Modules accepted: Orders

## 2020-01-01 NOTE — Telephone Encounter (Signed)
Pt. Indicated she wasn't able to get out yet, still recovering from recent hospital visit.  Plans to come on Monday.  Scot Jun, PT, DPT, OCS, ATC 01/01/20  11:42 AM

## 2020-01-04 ENCOUNTER — Encounter: Payer: Medicare Other | Admitting: Physical Therapy

## 2020-01-05 ENCOUNTER — Telehealth: Payer: Self-pay | Admitting: *Deleted

## 2020-01-05 NOTE — Telephone Encounter (Signed)
Ortho bundle 30 day call completed. °

## 2020-01-06 ENCOUNTER — Encounter: Payer: Medicare Other | Admitting: Physical Therapy

## 2020-01-06 ENCOUNTER — Other Ambulatory Visit: Payer: Self-pay | Admitting: Internal Medicine

## 2020-01-06 DIAGNOSIS — Z471 Aftercare following joint replacement surgery: Secondary | ICD-10-CM | POA: Diagnosis not present

## 2020-01-06 NOTE — Progress Notes (Signed)
Subjective:    Patient ID: Samantha [name], female    DOB: 29-Apr-1940, 79 y.o.   MRN: [id number]  HPI The patient is here for follow up from the ED   ED 11/30 for SOB.   It started 3 days prior an associated with left sided lower chest pain, dry cough and wheeze.  She tried her inhaler and it did not help - it made her feel worse.  .she had chills the day prior.  VSS.  Left sided chest tenderness.  Cr elevated at 1.63.  D-dimer 5.43.  Ct Angio neg for PE.  No PNA.  She had a breathing treatment and steroid with improvement.  She was diagnosed with viral URI, AKI.  She was discharged home on prednisone.   She is not feeling much better.  Yesterday afternoon she felt like she could have gone back to the ED.  She was sweating. She feels weak.  She feels intermittently just bad and may pass out.   She does feel lightheaded when she stands.  When she takes her water pill she feels like it makes her sweat.   She is drinking a lot of water, gatorade, tea.  She is eating.  She is taking all of her medication.  SOB x 2 weeks.  Inhalers help.  Prednisone did not help.   BP at home - 129/83, 108/67, 134/72, 141/76  She has had lower abdominal pain and occasionally a pain in her upper abdomen.  That is a burning type pain.   Medications and allergies reviewed with patient and updated if appropriate.  Patient Active Problem List   Diagnosis Date Noted  . 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 Aortic atherosclerosis ([place]) 11/03/2019  . Posterior vitreous detachment of right eye 07/15/2019  . Posterior vitreous detachment of left eye 07/15/2019  . 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 Diabetes mellitus without complication ([place]) 57/84/6962  . 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 Raynaud phenomenon 05/06/2019  . Trigger finger, left ring finger 03/18/2019  . Unilateral primary osteoarthritis, right knee 01/28/2018  . Status post lumbar spine operative procedure for  decompression of spinal cord 01/28/2018  . Sacral back pain 10/16/2017  . Chronic left upper quadrant pain 07/23/2017  . Rectal bleeding 07/23/2017  . Poor balance 03/14/2017  . Right shoulder pain 03/14/2017  . Angina pectoris ([place]) 03/22/2016  . Hypertensive heart disease 07/07/2015  . Essential hypertension 07/07/2015  . Plantar fasciitis, left 03/22/2015  . Coronary artery disease due to lipid rich plaque 01/05/2015  . Chronic diastolic CHF (congestive heart failure), NYHA class 2 ([place]) 01/05/2015  . 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 Migraine (Ocular) 01/05/2014  . VBI (vertebrobasilar insufficiency) 08/22/2012  . TIA (transient ischemic attack) 06/27/2012  . DJD (degenerative joint disease) 02/02/2011  . 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 Postherpetic neuralgia ? 01/02/2010  . Palpitations 11/23/2008  . UTI'S, RECURRENT 09/28/2008  . Fibromyalgia 08/15/2007  . 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 Hyperlipidemia 05/21/2006  . Reactive airway disease 01/29/2002    Current Outpatient Medications on File Prior to Visit  Medication Sig Dispense Refill  . acetaminophen (TYLENOL) 500 MG tablet Take 500 mg by mouth every 6 (six) hours as needed for mild pain.     [name] albuterol (VENTOLIN HFA) 108 (90 Base) MCG/ACT inhaler Inhale 2 puffs into the lungs every 6 (six) hours as needed  for wheezing or shortness of breath. 18 g 11  . aspirin (BAYER ASPIRIN) 325 MG tablet Take 1 tablet (325 mg total) by mouth daily. Take for 4 wks then stop (Patient taking differently: Take 325 mg by mouth daily.)    . budesonide-formoterol (SYMBICORT) 80-4.5 MCG/ACT inhaler Inhale 2 puffs into the lungs 2 (two) times daily. 6.9 g 11  . BYSTOLIC 10 MG tablet TAKE 1 TABLET(10 MG) BY MOUTH DAILY (Patient taking differently: Take 10 mg by mouth daily.) 90 tablet 2  . Calcium-Magnesium-Zinc (CAL-MAG-ZINC PO) Take 1 tablet  by mouth daily.    . cetirizine (ZYRTEC) 10 MG tablet TAKE 1 TABLET(10 MG) BY MOUTH DAILY (Patient taking differently: Take 10 mg by mouth daily.) 90 tablet 3  . cholecalciferol (VITAMIN D) 1000 UNITS tablet Take 1,000 Units by mouth every morning.     . cholestyramine (QUESTRAN) 4 g packet Take 1 packet (4 g total) by mouth daily. 30 packet 11  . clopidogrel (PLAVIX) 75 MG tablet TAKE 1 TABLET(75 MG) BY MOUTH DAILY (Patient taking differently: Take 75 mg by mouth daily.) 90 tablet 1  . Evolocumab (REPATHA SURECLICK) 349 MG/ML SOAJ Inject 1 pen into the skin every 14 (fourteen) days. 2 pen 11  . HYDROcodone-acetaminophen (NORCO) 7.5-325 MG tablet Take 1-2 tablets by mouth every 4 (four) hours as needed for severe pain (pain score 7-10). 40 tablet 0  . hydroxychloroquine (PLAQUENIL) 200 MG tablet Take 400 mg by mouth daily.     . isosorbide mononitrate (IMDUR) 60 MG 24 hr tablet TAKE 1 TABLET(60 MG) BY MOUTH DAILY (Patient taking differently: Take 60 mg by mouth daily.) 90 tablet 3  . losartan (COZAAR) 25 MG tablet Take 1 tablet (25 mg total) by mouth daily. 90 tablet 3  . metFORMIN (GLUCOPHAGE-XR) 500 MG 24 hr tablet TAKE 1 TABLET(500 MG) BY MOUTH DAILY WITH BREAKFAST 90 tablet 1  . Polyethyl Glycol-Propyl Glycol (SYSTANE) 0.4-0.3 % GEL ophthalmic gel Place 1 application into both eyes daily as needed (for dry eyes).     . Potassium 99 MG TABS Take 99 mg by mouth daily.    . pregabalin (LYRICA) 75 MG capsule TAKE 1 CAPSULE(75 MG) BY MOUTH AT BEDTIME 30 capsule 0  . tiZANidine (ZANAFLEX) 4 MG tablet Take 1 tablet (4 mg total) by mouth 2 (two) times daily as needed for muscle spasms. 40 tablet 0  . torsemide (DEMADEX) 20 MG tablet TAKE 1 TABLET(20 MG) BY MOUTH DAILY (Patient taking differently: Take 20 mg by mouth daily.) 90 tablet 3   No current facility-administered medications on file prior to visit.    Past Medical History:  Diagnosis Date  . A-fib ([place])   . Abnormal CT of the chest 2008    last CT4-l 2009:  . No f/u suggested   . Allergy   . Asthma   . CAD (coronary artery disease)    a. Coronary CTA 10/16: Coronary Ca score 211, mod non-obstructive CAD with LM mild plaque (25-50%), mid LAD 50-69%. b. Neg nuc 06/2015.  . Cataract    BILATERAL-REMOVED  . Chronic diastolic CHF (congestive heart failure) ([place])   . Collagen vascular disease ([place])    "arterial sclerosis" per pt  . Complication of anesthesia    trouble waking up  . Fibromyalgia   . GERD (gastroesophageal reflux disease)   . H/O hiatal hernia   . Heart murmur   . Hyperlipidemia   . Hypertension   . Malignant neoplasm of ascending colon ([place]) 2016  Minimally invasive right hemicolectomy to be done   . Neuromuscular disorder ([facility])    FIBROMYALGIA  . Ocular migraine   . OSA (obstructive sleep apnea) 09/2007   dx w/ a sleep study, not on  CPAP  . Osteoarthritis   . Osteoporosis   . Pneumonia    "double" in 2004  . PONV (postoperative nausea and vomiting)   . Reactive airway disease 01/29/2002   dx of pseudoasthma / vcd in 2005 and nl sprirometry History of dyspnea, 2011,  improved after several medications were changed around Question of COPD, disproved July 06, 2009 with nl pft's      . Rheumatoid factor positive   . Shingles 11/2009  . Sleep apnea   . Stroke ([place])   . TIA (transient ischemic attack)    x2 - on Plavix for this  . Torn rotator cuff    right worse than left, both are torn  . Tumor, 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  . Type II diabetes mellitus ([place])   . 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 Vaginal dysplasia     Past Surgical History:  Procedure Laterality Date  . ABDOMINAL HYSTERECTOMY  1980   NO oophorectomy per pt   . ANTERIOR CERVICAL DECOMP/DISCECTOMY FUSION  2001   C 3, C4 and C5 plate and screws  . BREAST BIOPSY Right 1999  . BUNIONECTOMY Left ~ 1977  . CARDIAC CATHETERIZATION     2018 By Dr. Pernell Dupre (done after colon surgery)  . CATARACT EXTRACTION W/ INTRAOCULAR  LENS  IMPLANT, BILATERAL  2012  . COLON SURGERY  10/2014  . EYE SURGERY Bilateral    torq lens for cataracts  . LEFT HEART CATH AND CORONARY ANGIOGRAPHY N/A 03/22/2016   Procedure: Left Heart Cath and Coronary Angiography;  Surgeon: Belva Crome, MD;  Location: [facility];  Service: Cardiovascular;  Laterality: N/A;  . LUMBAR LAMINECTOMY/DECOMPRESSION MICRODISCECTOMY N/A 01/20/2018   Procedure: L4-5 decompression;  Surgeon: Marybelle Killings, MD;  Location: [facility];  Service: Orthopedics;  Laterality: N/A;  . THYROIDECTOMY, PARTIAL  1960's  . TOTAL KNEE ARTHROPLASTY Right 12/04/2019   Procedure: RIGHT TOTAL KNEE ARTHROPLASTY;  Surgeon: Marybelle Killings, MD;  Location: [facility];  Service: Orthopedics;  Laterality: Right;  RNFA [place]   "Laser surgery for vaginal cancer; followed by chemotherapy" (06/27/2012)    Social History   Socioeconomic History  . Marital status: Married    Spouse name: Ilona Sorrel  . Number of children: 2  . Years of education: masters  . Highest education level: Not on file  Occupational History  . Occupation: Retired, disable since 2000    Employer: RETIRED  Tobacco Use  . Smoking status: Former Smoker    Packs/day: 0.25    Years: 5.00    Pack years: 1.25    Types: Cigarettes    Quit date: 01/29/1998    Years since quitting: 21.9  . Smokeless tobacco: Never Used  . Tobacco comment: Quit in 2001  Vaping Use  . Vaping Use: Never used  Substance and Sexual Activity  . Alcohol use: No    Alcohol/week: 0.0 standard drinks  . Drug use: No  . Sexual activity: Never  Other Topics Concern  . Not on file  Social History Narrative   On disability since 2000--- also husband has MS   Education. College.   Right handed.   Social Determinants of Health  Financial Resource Strain: Low Risk   . Difficulty of Paying Living Expenses: Not hard at all  Food Insecurity: No Food Insecurity  . Worried About Charity fundraiser in the  Last Year: Never true  . Ran Out of Food in the Last Year: Never true  Transportation Needs: No Transportation Needs  . Lack of Transportation (Medical): No  . Lack of Transportation (Non-Medical): No  Physical Activity: Sufficiently Active  . Days of Exercise per Week: 7 days  . Minutes of Exercise per Session: 30 min  Stress: No Stress Concern Present  . Feeling of Stress : Not at all  Social Connections: Not on file    Family History  Problem Relation Age of Onset  . Heart disease Father   . Heart disease Mother   . Lung cancer Mother   . Allergies Sister   . Parkinsonism Sister        possible  . Asthma Sister   . Asthma Paternal Grandmother   . Stroke Paternal Grandmother   . Heart disease Other        paternal grandparents, maternal grandparents,   . Heart disease Brother   . Emphysema Brother   . Aneurysm Brother        x3  . Kidney failure Brother   . Diabetes Brother   . Diabetes Brother   . Stroke Brother   . Stroke Maternal Grandmother   . Breast cancer Neg Hx   . Colon cancer Neg Hx   . Heart attack Neg Hx   . Esophageal cancer Neg Hx   . Rectal cancer Neg Hx   . Stomach cancer Neg Hx     Review of Systems  Constitutional: Positive for diaphoresis. Negative for chills and fever.  Respiratory: Positive for cough (dry), shortness of breath (x couple of weeks) and wheezing.   Cardiovascular: Positive for palpitations. Negative for chest pain and leg swelling.  Gastrointestinal: Positive for abdominal pain (lower abdomen x > 1 week), constipation, diarrhea (both more diarrhea than constipation) and nausea. Negative for blood in stool.  Genitourinary: Positive for difficulty urinating (not a steady stream). Negative for dysuria, frequency and hematuria.       No cloudy urine,  Urine occasionally gets brown  Neurological: Positive for light-headedness (with standing and with walking). Negative for headaches.       Objective:   Vitals:   01/07/20 1117   BP: 116/68  Pulse: 85  Temp: 98.2 F (36.8 C)  SpO2: 98%   BP Readings from Last 3 Encounters:  01/07/20 116/68  12/29/19 111/69  12/18/19 139/76   Wt Readings from Last 3 Encounters:  01/07/20 192 lb (87.1 kg)  12/18/19 195 lb (88.5 kg)  12/04/19 195 lb (88.5 kg)   Body mass index is 35.12 kg/m.   Physical Exam    Constitutional: Appears well-developed and well-nourished. No distress.  HENT:  Head: Normocephalic and atraumatic.  Neck: Neck supple. No tracheal deviation present. No thyromegaly present.  No cervical lymphadenopathy Cardiovascular: Normal rate, regular rhythm and normal heart sounds.   No murmur heard. No carotid bruit .  No edema Pulmonary/Chest: Effort normal and breath sounds normal. No respiratory distress. No has no wheezes. No rales. Abdomen: Soft, nondistended, tenderness along lower abdomen without rebound or guarding.  No upper abdomen tenderness with palpation Skin: Skin is warm and dry. Not diaphoretic.  Psychiatric: Normal mood and affect. Behavior is normal.   CT Angio Chest PE W/Cm &/Or Wo Cm CLINICAL DATA:  History of recent knee surgery with shortness of breath and positive D-dimer.  EXAM: CT ANGIOGRAPHY CHEST WITH CONTRAST  TECHNIQUE: Multidetector CT imaging of the chest was performed using the standard protocol during bolus administration of intravenous contrast. Multiplanar CT image reconstructions and MIPs were obtained to evaluate the vascular anatomy.  CONTRAST:  54mL OMNIPAQUE IOHEXOL 350 MG/ML SOLN  COMPARISON:  07/26/2015  FINDINGS: Cardiovascular: Atherosclerotic calcifications of the thoracic aorta and its branches are noted. No aneurysmal dilatation or dissection is seen. No cardiac enlargement is noted. Coronary calcifications are noted. Pulmonary artery is well visualized within normal branching pattern. No definitive filling defects to suggest pulmonary emboli are noted.  Mediastinum/Nodes: Thoracic inlet is  within normal limits. No sizable hilar or mediastinal adenopathy is noted. The esophagus as visualized is within normal limits.  Lungs/Pleura: Lungs are well aerated bilaterally. Minimal bibasilar atelectatic changes are seen. No focal infiltrate or effusion is noted. No pneumothorax is seen.  Upper Abdomen: Visualized upper abdomen shows no acute abnormality.  Musculoskeletal: Osseous structures demonstrate postsurgical changes in the cervical spine incompletely evaluated on this exam. The thoracic spine demonstrates multilevel degenerative change. No acute rib abnormality is noted. No compression deformity is seen.  Review of the MIP images confirms the above findings.  IMPRESSION: No evidence of pulmonary emboli.  Minimal bibasilar atelectatic changes.  Aortic Atherosclerosis (ICD10-I70.0).  Electronically Signed   By: Inez Catalina M.D.   On: 12/29/2019 19:25 DG Chest 2 View CLINICAL DATA:  Dyspnea.  EXAM: CHEST - 2 VIEW  COMPARISON:  November 27, 2019.  FINDINGS: The heart size and mediastinal contours are within normal limits. Both lungs are clear. No pneumothorax or pleural effusion is noted. The visualized skeletal structures are unremarkable.  IMPRESSION: No active cardiopulmonary disease.  Electronically Signed   By: Marijo Conception M.D.   On: 12/29/2019 14:50    Lab Results  Component Value Date   WBC 4.3 12/29/2019   HGB 12.0 12/29/2019   HCT 37.7 12/29/2019   PLT 222 12/29/2019   GLUCOSE 112 (H) 12/29/2019   CHOL 140 11/04/2019   TRIG 118.0 11/04/2019   HDL 81.20 11/04/2019   LDLDIRECT 126.2 12/07/2010   LDLCALC 35 11/04/2019   ALT 17 11/27/2019   AST 17 11/27/2019   NA 139 12/29/2019   K 3.9 12/29/2019   CL 100 12/29/2019   CREATININE 1.63 (H) 12/29/2019   BUN 29 (H) 12/29/2019   CO2 25 12/29/2019   TSH 2.980 10/01/2018   INR 0.95 07/22/2017   HGBA1C 6.7 (H) 11/04/2019   MICROALBUR 0.6 03/03/2010      Assessment & Plan:     See Problem List for Assessment and Plan of chronic medical problems.    This visit occurred during the SARS-CoV-2 public health emergency.  Safety protocols were in place, including screening questions prior to the visit, additional usage of staff PPE, and extensive cleaning of exam room while observing appropriate contact time as indicated for disinfecting solutions.

## 2020-01-06 NOTE — Patient Instructions (Addendum)
  Blood work and urine ordered      Medications changes include :   Hold losartan for now,  Hold metformin for now    Take pepcid once daily for 2 weeks

## 2020-01-07 ENCOUNTER — Other Ambulatory Visit: Payer: Self-pay

## 2020-01-07 ENCOUNTER — Encounter: Payer: Self-pay | Admitting: Internal Medicine

## 2020-01-07 ENCOUNTER — Ambulatory Visit (INDEPENDENT_AMBULATORY_CARE_PROVIDER_SITE_OTHER): Payer: Medicare Other | Admitting: Internal Medicine

## 2020-01-07 ENCOUNTER — Telehealth: Payer: Self-pay | Admitting: Internal Medicine

## 2020-01-07 VITALS — BP 116/68 | HR 85 | Temp 98.2°F | Ht 62.0 in | Wt 192.0 lb

## 2020-01-07 DIAGNOSIS — N179 Acute kidney failure, unspecified: Secondary | ICD-10-CM | POA: Diagnosis not present

## 2020-01-07 DIAGNOSIS — I2510 Atherosclerotic heart disease of native coronary artery without angina pectoris: Secondary | ICD-10-CM | POA: Diagnosis not present

## 2020-01-07 DIAGNOSIS — R42 Dizziness and giddiness: Secondary | ICD-10-CM

## 2020-01-07 DIAGNOSIS — R1013 Epigastric pain: Secondary | ICD-10-CM

## 2020-01-07 DIAGNOSIS — R3989 Other symptoms and signs involving the genitourinary system: Secondary | ICD-10-CM | POA: Diagnosis not present

## 2020-01-07 DIAGNOSIS — R1030 Lower abdominal pain, unspecified: Secondary | ICD-10-CM

## 2020-01-07 LAB — CBC WITH DIFFERENTIAL/PLATELET
Basophils Absolute: 0 10*3/uL (ref 0.0–0.1)
Basophils Relative: 0.6 % (ref 0.0–3.0)
Eosinophils Absolute: 0.2 10*3/uL (ref 0.0–0.7)
Eosinophils Relative: 3 % (ref 0.0–5.0)
HCT: 38.2 % (ref 36.0–46.0)
Hemoglobin: 12.5 g/dL (ref 12.0–15.0)
Lymphocytes Relative: 31 % (ref 12.0–46.0)
Lymphs Abs: 1.6 10*3/uL (ref 0.7–4.0)
MCHC: 32.6 g/dL (ref 30.0–36.0)
MCV: 86.1 fl (ref 78.0–100.0)
Monocytes Absolute: 0.5 10*3/uL (ref 0.1–1.0)
Monocytes Relative: 9.1 % (ref 3.0–12.0)
Neutro Abs: 2.9 10*3/uL (ref 1.4–7.7)
Neutrophils Relative %: 56.3 % (ref 43.0–77.0)
Platelets: 186 10*3/uL (ref 150.0–400.0)
RBC: 4.44 Mil/uL (ref 3.87–5.11)
RDW: 15.5 % (ref 11.5–15.5)
WBC: 5.1 10*3/uL (ref 4.0–10.5)

## 2020-01-07 LAB — URINALYSIS, ROUTINE W REFLEX MICROSCOPIC
Bilirubin Urine: NEGATIVE
Ketones, ur: NEGATIVE
Nitrite: NEGATIVE
Specific Gravity, Urine: >=1.03 — AB (ref 1.000–1.030)
Total Protein, Urine: 30 — AB
Urine Glucose: NEGATIVE
Urobilinogen, UA: 0.2 (ref 0.0–1.0)
pH: 5.5 (ref 5.0–8.0)

## 2020-01-07 LAB — COMPREHENSIVE METABOLIC PANEL
ALT: 13 U/L (ref 0–35)
AST: 12 U/L (ref 0–37)
Albumin: 4.5 g/dL (ref 3.5–5.2)
Alkaline Phosphatase: 58 U/L (ref 39–117)
BUN: 20 mg/dL (ref 6–23)
CO2: 28 mEq/L (ref 19–32)
Calcium: 9.5 mg/dL (ref 8.4–10.5)
Chloride: 100 mEq/L (ref 96–112)
Creatinine, Ser: 1.18 mg/dL (ref 0.40–1.20)
GFR: 44.5 mL/min — ABNORMAL LOW (ref >60.00)
Glucose, Bld: 112 mg/dL — ABNORMAL HIGH (ref 70–99)
Potassium: 3.6 mEq/L (ref 3.5–5.1)
Sodium: 139 mEq/L (ref 135–145)
Total Bilirubin: 0.6 mg/dL (ref 0.2–1.2)
Total Protein: 7.8 g/dL (ref 6.0–8.3)

## 2020-01-07 MED ORDER — AMOXICILLIN-POT CLAVULANATE 875-125 MG PO TABS: 1.0000 | ORAL_TABLET | Freq: Two times a day (BID) | ORAL | 0 refills | Status: DC

## 2020-01-07 MED ORDER — FAMOTIDINE 40 MG PO TABS: 40.0000 mg | ORAL_TABLET | Freq: Every day | ORAL | 0 refills | Status: DC

## 2020-01-07 NOTE — Telephone Encounter (Signed)
Spoke with patient and info given 

## 2020-01-07 NOTE — Assessment & Plan Note (Signed)
Acute She states abnormal urine color on occasion-brown in color No obvious dysuria She does have lower abdominal pain She has a history of recurrent UTIs Some of her symptoms are suggestive of UTI-check urinalysis, urine culture Increase fluids

## 2020-01-07 NOTE — Assessment & Plan Note (Addendum)
Acute Lower abdominal pain on exam-no rebounding Concern for possible UTI Check CBC, CMP, urinalysis and urine culture

## 2020-01-07 NOTE — Telephone Encounter (Signed)
Her urine does look like there is a possible infection - start augmentin for one week - sent to pharmacy.    Kidney function is better. Liver tests and blood counts are normal.

## 2020-01-07 NOTE — Assessment & Plan Note (Signed)
Acute Has been experiencing intermittent sharp pains in her epigastric region, no GERD She just completed her course of full aspirin for 4 weeks as prevention for blood clot after knee surgery Concern for possible gastritis Start Pepcid 40 mg daily for 2 weeks

## 2020-01-07 NOTE — Assessment & Plan Note (Signed)
Acute Has lightheadedness with walking, but especially when standing up Blood pressure well controlled here, but on the low side at home and here Will hold losartan given recent decreased kidney function Continue to hydrate CMP, CBC today

## 2020-01-07 NOTE — Assessment & Plan Note (Signed)
Acute While she was in the ED her kidney function was much worse than usual ?  Related to dehydration versus possible kidney infection Her urine was not checked Will recheck kidney function-CMP Some of her symptoms suggest UTI-we will evaluate for that as well Blood pressure on the low side and she is experiencing lightheadedness especially when getting up Hold losartan for now and hold Metformin for now-most likely will be able to start next week again

## 2020-01-08 ENCOUNTER — Encounter: Payer: Medicare Other | Admitting: Physical Therapy

## 2020-01-09 LAB — URINE CULTURE

## 2020-01-13 ENCOUNTER — Other Ambulatory Visit: Payer: Self-pay

## 2020-01-13 ENCOUNTER — Encounter: Payer: Self-pay | Admitting: Orthopaedic Surgery

## 2020-01-13 ENCOUNTER — Ambulatory Visit (INDEPENDENT_AMBULATORY_CARE_PROVIDER_SITE_OTHER): Payer: Medicare Other | Admitting: Orthopaedic Surgery

## 2020-01-13 VITALS — BP 93/60 | HR 83 | Ht 62.0 in | Wt 192.0 lb

## 2020-01-13 DIAGNOSIS — Z96651 Presence of right artificial knee joint: Secondary | ICD-10-CM

## 2020-01-13 NOTE — Progress Notes (Signed)
Post-Op Visit Note   Patient: Samantha [name]           Date of Birth: May 17, 1940           MRN: [id number] Visit Date: 01/13/2020 PCP: [name], MD   Assessment & Plan: Patient was in the hospital had acute upper respiratory infection also had a UTI with E. coli and then had acute kidney injury possibly related to dehydration.  She was treated with antibiotics went home the next day from the hospital and states to is making progress with her knee.  She has full extension she can flex past 100 degrees she is able do a straight leg lift.  Previous x-rays look good.  I will check her back in a month.  Incision is well-healed minimal knee swelling.  Chief Complaint:  Chief Complaint  Patient presents with  . Right Knee - Follow-up    12/04/2019 right TKA   Visit Diagnoses:  1. S/P TKR (total knee replacement), right     Plan: recheck one month.   Follow-Up Instructions: No follow-ups on file.   Orders:  No orders of the defined types were placed in this encounter.  No orders of the defined types were placed in this encounter.   Imaging: No results found.  PMFS History: Patient Active Problem List   Diagnosis Date Noted  . Abnormal urine color 01/07/2020  . AKI (acute kidney injury) ([place]) 01/07/2020  . Epigastric pain 01/07/2020  . 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 Aortic atherosclerosis ([place]) 11/03/2019  . Posterior vitreous detachment of right eye 07/15/2019  . Posterior vitreous detachment of left eye 07/15/2019  . 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 Diabetes mellitus without complication ([place]) 78/29/5621  . 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 Raynaud phenomenon 05/06/2019  . Trigger finger, left ring finger 03/18/2019  . 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 01/28/2018  . Sacral back pain 10/16/2017  . Chronic left upper quadrant pain 07/23/2017  . Rectal bleeding 07/23/2017  . Poor  balance 03/14/2017  . Right shoulder pain 03/14/2017  . Lower abdominal pain 04/18/2016  . Angina pectoris ([place]) 03/22/2016  . Hypertensive heart disease 07/07/2015  . Essential hypertension 07/07/2015  . Lightheadedness 07/07/2015  . Plantar fasciitis, left 03/22/2015  . Coronary artery disease due to lipid rich plaque 01/05/2015  . Chronic diastolic CHF (congestive heart failure), NYHA class 2 ([place]) 01/05/2015  . 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 Migraine (Ocular) 01/05/2014  . VBI (vertebrobasilar insufficiency) 08/22/2012  . TIA (transient ischemic attack) 06/27/2012  . DJD (degenerative joint disease) 02/02/2011  . 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 Postherpetic neuralgia ? 01/02/2010  . Palpitations 11/23/2008  . UTI'S, RECURRENT 09/28/2008  . Fibromyalgia 08/15/2007  . 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 Hyperlipidemia 05/21/2006  . Reactive airway disease 01/29/2002   Past Medical History:  Diagnosis Date  . A-fib ([place])   . Abnormal CT of the chest 2008   last CT4-l 2009:  . No f/u suggested   . Allergy   . Asthma   . CAD (coronary artery disease)    a. Coronary CTA 10/16: Coronary Ca score 211, mod non-obstructive CAD with LM mild plaque (25-50%), mid LAD 50-69%. b. Neg nuc 06/2015.  . Cataract    BILATERAL-REMOVED  . Chronic diastolic CHF (congestive heart failure) ([place])   .  Collagen vascular disease ([place])    "arterial sclerosis" per pt  . Complication of anesthesia    trouble waking up  . Fibromyalgia   . GERD (gastroesophageal reflux disease)   . H/O hiatal hernia   . Heart murmur   . Hyperlipidemia   . Hypertension   . Malignant neoplasm of ascending colon ([place]) 2016   Minimally invasive right hemicolectomy to be done   . Neuromuscular disorder (H[facility])    FIBROMYALGIA  . Ocular migraine   . OSA (obstructive sleep apnea) 09/2007   dx w/ a sleep study, not on   CPAP  . Osteoarthritis   . Osteoporosis   . Pneumonia    "double" in 2004  . PONV (postoperative nausea and vomiting)   . Reactive airway disease 01/29/2002   dx of pseudoasthma / vcd in 2005 and nl sprirometry History of dyspnea, 2011,  improved after several medications were changed around Question of COPD, disproved July 06, 2009 with nl pft's      . Rheumatoid factor positive   . Shingles 11/2009  . Sleep apnea   . Stroke ([place])   . TIA (transient ischemic attack)    x2 - on Plavix for this  . Torn rotator cuff    right worse than left, both are torn  . Tumor, 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  . Type II diabetes mellitus ([place])   . 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 Vaginal dysplasia     Family History  Problem Relation Age of Onset  . Heart disease Father   . Heart disease Mother   . Lung cancer Mother   . Allergies Sister   . Parkinsonism Sister        possible  . Asthma Sister   . Asthma Paternal Grandmother   . Stroke Paternal Grandmother   . Heart disease Other        paternal grandparents, maternal grandparents,   . Heart disease Brother   . Emphysema Brother   . Aneurysm Brother        x3  . Kidney failure Brother   . Diabetes Brother   . Diabetes Brother   . Stroke Brother   . Stroke Maternal Grandmother   . Breast cancer Neg Hx   . Colon cancer Neg Hx   . Heart attack Neg Hx   . Esophageal cancer Neg Hx   . Rectal cancer Neg Hx   . Stomach cancer Neg Hx     Past Surgical History:  Procedure Laterality Date  . ABDOMINAL HYSTERECTOMY  1980   NO oophorectomy per pt   . ANTERIOR CERVICAL DECOMP/DISCECTOMY FUSION  2001   C 3, C4 and C5 plate and screws  . BREAST BIOPSY Right 1999  . BUNIONECTOMY Left ~ 1977  . CARDIAC CATHETERIZATION     2018 By Dr. Pernell Dupre (done after colon surgery)  . CATARACT EXTRACTION W/ INTRAOCULAR LENS  IMPLANT, BILATERAL  2012  . COLON SURGERY  10/2014  . EYE SURGERY Bilateral    torq lens for cataracts  . LEFT  HEART CATH AND CORONARY ANGIOGRAPHY N/A 03/22/2016   Procedure: Left Heart Cath and Coronary Angiography;  Surgeon: Belva Crome, MD;  Location: [facility];  Service: Cardiovascular;  Laterality: N/A;  . LUMBAR LAMINECTOMY/DECOMPRESSION MICRODISCECTOMY N/A 01/20/2018   Procedure: L4-5 decompression;  Surgeon: Marybelle Killings, MD;  Location: [facility];  Service: Orthopedics;  Laterality: N/A;  . THYROIDECTOMY, PARTIAL  1960's  .  TOTAL KNEE ARTHROPLASTY Right 12/04/2019   Procedure: RIGHT TOTAL KNEE ARTHROPLASTY;  Surgeon: Marybelle Killings, MD;  Location: [facility];  Service: Orthopedics;  Laterality: Right;  RNFA [place]   "Laser surgery for vaginal cancer; followed by chemotherapy" (06/27/2012)   Social History   Occupational History  . Occupation: Retired, disable since 2000    Employer: RETIRED  Tobacco Use  . Smoking status: Former Smoker    Packs/day: 0.25    Years: 5.00    Pack years: 1.25    Types: Cigarettes    Quit date: 01/29/1998    Years since quitting: 21.9  . Smokeless tobacco: Never Used  . Tobacco comment: Quit in 2001  Vaping Use  . Vaping Use: Never used  Substance and Sexual Activity  . Alcohol use: No    Alcohol/week: 0.0 standard drinks  . Drug use: No  . Sexual activity: Never

## 2020-01-14 NOTE — Progress Notes (Signed)
Subjective:    Patient ID: Samantha [name], female    DOB: 1940/10/19, 79 y.o.   MRN: [id number]  HPI The patient is here for follow up of their chronic medical problems, including neuropathy, htn, DM, hyperlipidemia, fibromyalgia  She was here last week for a UTI. Completed Augmentin.  Until yesterday she was not feeling well.  Her urine had bubbles in it at one point.  Yesterday and today she has not had any pain and has had no symptoms and feels much better.   Held metformin and losartan at that time due to AKI.  She has not restarted them.  She feels her blood pressure has been very good and occasionally in the low side.  Sugar has been well controlled- highest 104.  She has been more active and she is compliant with a diabetic diet.    Medications and allergies reviewed with patient and updated if appropriate.  Patient Active Problem List   Diagnosis Date Noted  . Abnormal urine color 01/07/2020  . AKI (acute kidney injury) ([place]) 01/07/2020  . Epigastric pain 01/07/2020  . 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 Aortic atherosclerosis ([place]) 11/03/2019  . Posterior vitreous detachment of right eye 07/15/2019  . Posterior vitreous detachment of left eye 07/15/2019  . 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 Diabetes mellitus without complication ([place]) 91/63/8466  . 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 Raynaud phenomenon 05/06/2019  . Trigger finger, left ring finger 03/18/2019  . 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 01/28/2018  . Sacral back pain 10/16/2017  . Chronic left upper quadrant pain 07/23/2017  . Rectal bleeding 07/23/2017  . Poor balance 03/14/2017  . Right shoulder pain 03/14/2017  . Lower abdominal pain 04/18/2016  . Angina pectoris ([place]) 03/22/2016  . Hypertensive heart disease 07/07/2015  . Essential hypertension 07/07/2015  . Lightheadedness 07/07/2015  . Plantar fasciitis, left 03/22/2015   . Coronary artery disease due to lipid rich plaque 01/05/2015  . Chronic diastolic CHF (congestive heart failure), NYHA class 2 ([place]) 01/05/2015  . 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 Migraine (Ocular) 01/05/2014  . VBI (vertebrobasilar insufficiency) 08/22/2012  . TIA (transient ischemic attack) 06/27/2012  . DJD (degenerative joint disease) 02/02/2011  . 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 Postherpetic neuralgia ? 01/02/2010  . Palpitations 11/23/2008  . UTI'S, RECURRENT 09/28/2008  . Fibromyalgia 08/15/2007  . 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 Hyperlipidemia 05/21/2006  . Reactive airway disease 01/29/2002    Current Outpatient Medications on File Prior to Visit  Medication Sig Dispense Refill  . acetaminophen (TYLENOL) 500 MG tablet Take 500 mg by mouth every 6 (six) hours as needed for mild pain.     [name] albuterol (VENTOLIN HFA) 108 (90 Base) MCG/ACT inhaler Inhale 2 puffs into the lungs every 6 (six) hours as needed for wheezing or shortness of breath. 18 g 11  . budesonide-formoterol (SYMBICORT) 80-4.5 MCG/ACT inhaler Inhale 2 puffs into the lungs 2 (two) times daily. 6.9 g 11  . BYSTOLIC 10 MG tablet TAKE 1 TABLET(10 MG) BY MOUTH DAILY (Patient taking differently: Take 10 mg by mouth daily.) 90 tablet 2  . Calcium-Magnesium-Zinc (CAL-MAG-ZINC PO) Take 1 tablet by mouth daily.    . cetirizine (ZYRTEC) 10 MG tablet TAKE 1 TABLET(10 MG) BY MOUTH DAILY (Patient taking differently: Take 10 mg by mouth daily.) 90 tablet 3  .  cholecalciferol (VITAMIN D) 1000 UNITS tablet Take 1,000 Units by mouth every morning.     . cholestyramine (QUESTRAN) 4 g packet Take 1 packet (4 g total) by mouth daily. 30 packet 11  . clopidogrel (PLAVIX) 75 MG tablet TAKE 1 TABLET(75 MG) BY MOUTH DAILY (Patient taking differently: Take 75 mg by mouth daily.) 90 tablet 1  . Evolocumab (REPATHA SURECLICK) 831 MG/ML SOAJ  Inject 1 pen into the skin every 14 (fourteen) days. 2 pen 11  . famotidine (PEPCID) 40 MG tablet Take 1 tablet (40 mg total) by mouth daily. 14 tablet 0  . HYDROcodone-acetaminophen (NORCO) 7.5-325 MG tablet Take 1-2 tablets by mouth every 4 (four) hours as needed for severe pain (pain score 7-10). 40 tablet 0  . hydroxychloroquine (PLAQUENIL) 200 MG tablet Take 400 mg by mouth daily.     . isosorbide mononitrate (IMDUR) 60 MG 24 hr tablet TAKE 1 TABLET(60 MG) BY MOUTH DAILY (Patient taking differently: Take 60 mg by mouth daily.) 90 tablet 3  . Polyethyl Glycol-Propyl Glycol (SYSTANE) 0.4-0.3 % GEL ophthalmic gel Place 1 application into both eyes daily as needed (for dry eyes).     . Potassium 99 MG TABS Take 99 mg by mouth daily.    . pregabalin (LYRICA) 75 MG capsule TAKE 1 CAPSULE(75 MG) BY MOUTH AT BEDTIME 30 capsule 0  . tiZANidine (ZANAFLEX) 4 MG tablet Take 1 tablet (4 mg total) by mouth 2 (two) times daily as needed for muscle spasms. 40 tablet 0  . torsemide (DEMADEX) 20 MG tablet TAKE 1 TABLET(20 MG) BY MOUTH DAILY (Patient taking differently: Take 20 mg by mouth daily.) 90 tablet 3  . losartan (COZAAR) 25 MG tablet Take 1 tablet (25 mg total) by mouth daily. (Patient not taking: Reported on 01/15/2020) 90 tablet 3  . metFORMIN (GLUCOPHAGE-XR) 500 MG 24 hr tablet TAKE 1 TABLET(500 MG) BY MOUTH DAILY WITH BREAKFAST (Patient not taking: Reported on 01/15/2020) 90 tablet 1   No current facility-administered medications on file prior to visit.    Past Medical History:  Diagnosis Date  . A-fib ([place])   . Abnormal CT of the chest 2008   last CT4-l 2009:  . No f/u suggested   . Allergy   . Asthma   . CAD (coronary artery disease)    a. Coronary CTA 10/16: Coronary Ca score 211, mod non-obstructive CAD with LM mild plaque (25-50%), mid LAD 50-69%. b. Neg nuc 06/2015.  . Cataract    BILATERAL-REMOVED  . Chronic diastolic CHF (congestive heart failure) ([place])   . Collagen vascular disease  ([place])    "arterial sclerosis" per pt  . Complication of anesthesia    trouble waking up  . Fibromyalgia   . GERD (gastroesophageal reflux disease)   . H/O hiatal hernia   . Heart murmur   . Hyperlipidemia   . Hypertension   . Malignant neoplasm of ascending colon ([place]) 2016   Minimally invasive right hemicolectomy to be done   . Neuromuscular disorder (H[facility])    FIBROMYALGIA  . Ocular migraine   . OSA (obstructive sleep apnea) 09/2007   dx w/ a sleep study, not on  CPAP  . Osteoarthritis   . Osteoporosis   . Pneumonia    "double" in 2004  . PONV (postoperative nausea and vomiting)   . Reactive airway disease 01/29/2002   dx of pseudoasthma / vcd in 2005 and nl sprirometry History of dyspnea, 2011,  improved after several medications were changed around Question  of COPD, disproved July 06, 2009 with nl pft's      . Rheumatoid factor positive   . Shingles 11/2009  . Sleep apnea   . Stroke ([place])   . TIA (transient ischemic attack)    x2 - on Plavix for this  . Torn rotator cuff    right worse than left, both are torn  . Tumor, 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  . Type II diabetes mellitus ([place])   . 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 Vaginal dysplasia     Past Surgical History:  Procedure Laterality Date  . ABDOMINAL HYSTERECTOMY  1980   NO oophorectomy per pt   . ANTERIOR CERVICAL DECOMP/DISCECTOMY FUSION  2001   C 3, C4 and C5 plate and screws  . BREAST BIOPSY Right 1999  . BUNIONECTOMY Left ~ 1977  . CARDIAC CATHETERIZATION     2018 By Dr. Pernell Dupre (done after colon surgery)  . CATARACT EXTRACTION W/ INTRAOCULAR LENS  IMPLANT, BILATERAL  2012  . COLON SURGERY  10/2014  . EYE SURGERY Bilateral    torq lens for cataracts  . LEFT HEART CATH AND CORONARY ANGIOGRAPHY N/A 03/22/2016   Procedure: Left Heart Cath and Coronary Angiography;  Surgeon: Belva Crome, MD;  Location: [facility];  Service: Cardiovascular;  Laterality: N/A;  . LUMBAR  LAMINECTOMY/DECOMPRESSION MICRODISCECTOMY N/A 01/20/2018   Procedure: L4-5 decompression;  Surgeon: Marybelle Killings, MD;  Location: [facility];  Service: Orthopedics;  Laterality: N/A;  . THYROIDECTOMY, PARTIAL  1960's  . TOTAL KNEE ARTHROPLASTY Right 12/04/2019   Procedure: RIGHT TOTAL KNEE ARTHROPLASTY;  Surgeon: Marybelle Killings, MD;  Location: [facility];  Service: Orthopedics;  Laterality: Right;  RNFA [facility]   "Laser surgery for vaginal cancer; followed by chemotherapy" (06/27/2012)    Social History   Socioeconomic History  . Marital status: Married    Spouse name: Ilona Sorrel  . Number of children: 2  . Years of education: masters  . Highest education level: Not on file  Occupational History  . Occupation: Retired, disable since 2000    Employer: RETIRED  Tobacco Use  . Smoking status: Former Smoker    Packs/day: 0.25    Years: 5.00    Pack years: 1.25    Types: Cigarettes    Quit date: 01/29/1998    Years since quitting: 21.9  . Smokeless tobacco: Never Used  . Tobacco comment: Quit in 2001  Vaping Use  . Vaping Use: Never used  Substance and Sexual Activity  . Alcohol use: No    Alcohol/week: 0.0 standard drinks  . Drug use: No  . Sexual activity: Never  Other Topics Concern  . Not on file  Social History Narrative   On disability since 2000--- also husband has MS   Education. College.   Right handed.   Social Determinants of Health   Financial Resource Strain: Low Risk   . Difficulty of Paying Living Expenses: Not hard at all  Food Insecurity: No Food Insecurity  . Worried About Charity fundraiser in the Last Year: Never true  . Ran Out of Food in the Last Year: Never true  Transportation Needs: No Transportation Needs  . Lack of Transportation (Medical): No  . Lack of Transportation (Non-Medical): No  Physical Activity: Sufficiently Active  . Days of Exercise per Week: 7 days  . Minutes of Exercise per Session: 30 min  Stress: No  Stress Concern Present  . Feeling of Stress : Not at all  Social Connections: Not on file    Family History  Problem Relation Age of Onset  . Heart disease Father   . Heart disease Mother   . Lung cancer Mother   . Allergies Sister   . Parkinsonism Sister        possible  . Asthma Sister   . Asthma Paternal Grandmother   . Stroke Paternal Grandmother   . Heart disease Other        paternal grandparents, maternal grandparents,   . Heart disease Brother   . Emphysema Brother   . Aneurysm Brother        x3  . Kidney failure Brother   . Diabetes Brother   . Diabetes Brother   . Stroke Brother   . Stroke Maternal Grandmother   . Breast cancer Neg Hx   . Colon cancer Neg Hx   . Heart attack Neg Hx   . Esophageal cancer Neg Hx   . Rectal cancer Neg Hx   . Stomach cancer Neg Hx     Review of Systems  Constitutional: Negative for appetite change, chills, diaphoresis and fever.  Respiratory: Negative for shortness of breath.   Cardiovascular: Positive for palpitations (occ). Negative for chest pain.  Gastrointestinal: Negative for abdominal pain and nausea.  Genitourinary: Negative for dysuria and hematuria.  Neurological: Negative for light-headedness and headaches.       Objective:   Vitals:   01/15/20 1147  BP: 120/72  Pulse: 71  Temp: 98.1 F (36.7 C)  SpO2: 98%   BP Readings from Last 3 Encounters:  01/15/20 120/72  01/13/20 93/60  01/07/20 116/68   Wt Readings from Last 3 Encounters:  01/15/20 196 lb (88.9 kg)  01/13/20 192 lb (87.1 kg)  01/07/20 192 lb (87.1 kg)   Body mass index is 35.85 kg/m.   Physical Exam    Constitutional: Appears well-developed and well-nourished. No distress.  HENT:  Head: Normocephalic and atraumatic.  Neck: Neck supple. No tracheal deviation present. No thyromegaly present.  No cervical lymphadenopathy Cardiovascular: Normal rate, regular rhythm and normal heart sounds.   No murmur heard. No carotid bruit .  No  edema Pulmonary/Chest: Effort normal and breath sounds normal. No respiratory distress. No has no wheezes. No rales.  Skin: Skin is warm and dry. Not diaphoretic.  Psychiatric: Normal mood and affect. Behavior is normal.      Assessment & Plan:    See Problem List for Assessment and Plan of chronic medical problems.    This visit occurred during the SARS-CoV-2 public health emergency.  Safety protocols were in place, including screening questions prior to the visit, additional usage of staff PPE, and extensive cleaning of exam room while observing appropriate contact time as indicated for disinfecting solutions.

## 2020-01-14 NOTE — Patient Instructions (Addendum)
°  Urine tests were ordered.     Medications changes include :   Discontinue the metformin and losartan for now - continue to monitor your BP and sugar.      Please followup in 3 months

## 2020-01-15 ENCOUNTER — Encounter: Payer: Self-pay | Admitting: Internal Medicine

## 2020-01-15 ENCOUNTER — Ambulatory Visit (INDEPENDENT_AMBULATORY_CARE_PROVIDER_SITE_OTHER): Payer: Medicare Other | Admitting: Internal Medicine

## 2020-01-15 ENCOUNTER — Other Ambulatory Visit: Payer: Self-pay

## 2020-01-15 VITALS — BP 120/72 | HR 71 | Temp 98.1°F | Ht 62.0 in | Wt 196.0 lb

## 2020-01-15 DIAGNOSIS — I10 Essential (primary) hypertension: Secondary | ICD-10-CM | POA: Diagnosis not present

## 2020-01-15 DIAGNOSIS — I2510 Atherosclerotic heart disease of native coronary artery without angina pectoris: Secondary | ICD-10-CM

## 2020-01-15 DIAGNOSIS — Z794 Long term (current) use of insulin: Secondary | ICD-10-CM

## 2020-01-15 DIAGNOSIS — N3000 Acute cystitis without hematuria: Secondary | ICD-10-CM | POA: Diagnosis not present

## 2020-01-15 DIAGNOSIS — E1142 Type 2 diabetes mellitus with diabetic polyneuropathy: Secondary | ICD-10-CM | POA: Diagnosis not present

## 2020-01-15 LAB — URINALYSIS, ROUTINE W REFLEX MICROSCOPIC
Bilirubin Urine: NEGATIVE
Ketones, ur: NEGATIVE
Leukocytes,Ua: NEGATIVE
Nitrite: NEGATIVE
Specific Gravity, Urine: >=1.03 — AB (ref 1.000–1.030)
Total Protein, Urine: NEGATIVE
Urine Glucose: NEGATIVE
Urobilinogen, UA: 0.2 (ref 0.0–1.0)
pH: 5.5 (ref 5.0–8.0)

## 2020-01-15 MED ORDER — FAMOTIDINE 40 MG PO TABS
40.0000 mg | ORAL_TABLET | Freq: Every day | ORAL | 5 refills | Status: DC | PRN
Start: 2020-01-15 — End: 2020-07-14

## 2020-01-15 NOTE — Assessment & Plan Note (Addendum)
Chronic Stopped losartan last week because of dehydration and decreased kidney function Blood pressure has been doing very well and is still occasionally on the low side so at this time I will discontinue losartan-advised we may need to restart this if her blood pressure goes up Continue Bystolic 10 mg daily, Imdur 60 mg daily, torsemide 20 mg daily

## 2020-01-15 NOTE — Assessment & Plan Note (Signed)
Chronic Has a history of UTIs Last week UTI confirmed-completed Augmentin and just yesterday she started to really feel like she was better and had no symptoms We will recheck UA, urine culture given the severity of her UTI

## 2020-01-15 NOTE — Assessment & Plan Note (Addendum)
Chronic Sugars well controlled at home-highest she is seeing recently is 104 She has been more active and has been compliant with a diabetic diet and will continue this We will hold Metformin for now-there is a chance she may not need this, but will consider restarting it if sugars go up Follow-up in 3 months

## 2020-01-17 LAB — URINE CULTURE: Result:: NO GROWTH

## 2020-01-28 ENCOUNTER — Other Ambulatory Visit: Payer: Self-pay | Admitting: Internal Medicine

## 2020-02-03 ENCOUNTER — Telehealth: Payer: Self-pay | Admitting: Orthopaedic Surgery

## 2020-02-03 NOTE — Telephone Encounter (Signed)
Please advise 

## 2020-02-03 NOTE — Telephone Encounter (Signed)
Pt needs refill on hydrocodone.  °

## 2020-02-03 NOTE — Telephone Encounter (Signed)
Time to change to ultram. Stop norco.  Ultram # 30 one po bid prn pain thanks ucall

## 2020-02-03 NOTE — Telephone Encounter (Signed)
Patient is unable to take tramadol. She is allergic. She states that she is only taking 1/2 tablet of hydrocodone in the morning and 1/2 hydrocodone at night which helps keep the terrible pain at bay. She asks that you please refill the hydrocodone. She has been trying to wean and take tylenol with no relief.  Please advise.

## 2020-02-04 ENCOUNTER — Telehealth: Payer: Self-pay | Admitting: Orthopaedic Surgery

## 2020-02-04 ENCOUNTER — Other Ambulatory Visit: Payer: Self-pay | Admitting: Orthopaedic Surgery

## 2020-02-04 DIAGNOSIS — Z96651 Presence of right artificial knee joint: Secondary | ICD-10-CM

## 2020-02-04 NOTE — Telephone Encounter (Signed)
FYI, she stopped therapy with kidney infection. Leg weak, needs to restart therapy, she is going to call therapy in our office and restart. Time to stop narcotics, knee hurting  because no therapy in several weeks and leg weak.

## 2020-02-04 NOTE — Telephone Encounter (Signed)
Perfect thanks

## 2020-02-04 NOTE — Telephone Encounter (Signed)
Referral entered  

## 2020-02-04 NOTE — Telephone Encounter (Signed)
Ok for referral?

## 2020-02-04 NOTE — Telephone Encounter (Signed)
Patient called. She would like a referral for PT to come here to [facility].

## 2020-02-04 NOTE — Telephone Encounter (Signed)
noted 

## 2020-02-08 ENCOUNTER — Other Ambulatory Visit: Payer: Self-pay | Admitting: Gastroenterology

## 2020-02-09 ENCOUNTER — Ambulatory Visit (INDEPENDENT_AMBULATORY_CARE_PROVIDER_SITE_OTHER): Payer: Medicare Other | Admitting: Physical Therapy

## 2020-02-09 ENCOUNTER — Encounter: Payer: Self-pay | Admitting: Physical Therapy

## 2020-02-09 ENCOUNTER — Other Ambulatory Visit: Payer: Self-pay

## 2020-02-09 DIAGNOSIS — R600 Localized edema: Secondary | ICD-10-CM

## 2020-02-09 DIAGNOSIS — R262 Difficulty in walking, not elsewhere classified: Secondary | ICD-10-CM

## 2020-02-09 DIAGNOSIS — M25661 Stiffness of right knee, not elsewhere classified: Secondary | ICD-10-CM | POA: Diagnosis not present

## 2020-02-09 DIAGNOSIS — R2689 Other abnormalities of gait and mobility: Secondary | ICD-10-CM

## 2020-02-09 DIAGNOSIS — M25561 Pain in right knee: Secondary | ICD-10-CM

## 2020-02-09 DIAGNOSIS — M6281 Muscle weakness (generalized): Secondary | ICD-10-CM | POA: Diagnosis not present

## 2020-02-09 NOTE — Patient Instructions (Signed)
Access Code: [email or web address] URL: [email or web address] Date: 02/09/2020 Prepared by: Faustino Congress  Exercises Seated Hamstring Stretch - 2 x daily - 6 x weekly - 1 sets - 3 reps - 30 hold Seated Long Arc Quad - 2 x daily - 6 x weekly - 2-3 sets - 10 reps - 3 sec hold Sit to Stand with Armchair - 2 x daily - 6 x weekly - 2-3 sets - 5 reps Supine Bridge - 2 x daily - 7 x weekly - 10 reps - 1 sets - 5 sec hold Standing Knee Flexion with Counter Support - 2 x daily - 7 x weekly - 1 sets - 20 reps

## 2020-02-09 NOTE — Therapy (Signed)
[place], [place], [address] Phone: [phone]   Fax:  [phone]  Physical Therapy Re-Evaluation  Patient Details  Name: Samantha [name] MRN: [id number] Date of Birth: 06/25/1940 Referring Provider (PT): Rodell Perna   Encounter Date: 02/09/2020   PT End of Session - 02/09/20 1248    Visit Number 4    Number of Visits 16    Date for PT Re-Evaluation 03/22/20    Authorization Type Medicare/Tricare    PT Start Time 1150    PT Stop Time 1225    PT Time Calculation (min) 35 min    Activity Tolerance Patient tolerated treatment well    Behavior During Therapy [facility] for tasks assessed/performed           Past Medical History:  Diagnosis Date  . A-fib ([place])   . Abnormal CT of the chest 2008   last CT4-l 2009:  . No f/u suggested   . Allergy   . Asthma   . CAD (coronary artery disease)    a. Coronary CTA 10/16: Coronary Ca score 211, mod non-obstructive CAD with LM mild plaque (25-50%), mid LAD 50-69%. b. Neg nuc 06/2015.  . Cataract    BILATERAL-REMOVED  . Chronic diastolic CHF (congestive heart failure) ([place])   . Collagen vascular disease ([place])    "arterial sclerosis" per pt  . Complication of anesthesia    trouble waking up  . Fibromyalgia   . GERD (gastroesophageal reflux disease)   . H/O hiatal hernia   . Heart murmur   . Hyperlipidemia   . Hypertension   . Malignant neoplasm of ascending colon ([place]) 2016   Minimally invasive right hemicolectomy to be done   . Neuromuscular disorder (HCC)    FIBROMYALGIA  . Ocular migraine   . OSA (obstructive sleep apnea) 09/2007   dx w/ a sleep study, not on  CPAP  . Osteoarthritis   . Osteoporosis   . Pneumonia    "double" in 2004  . PONV (postoperative nausea and vomiting)   . Reactive airway disease 01/29/2002   dx of pseudoasthma / vcd in 2005 and nl sprirometry History of dyspnea, 2011,  improved after several medications were changed around Question of  COPD, disproved July 06, 2009 with nl pft's      . Rheumatoid factor positive   . Shingles 11/2009  . Sleep apnea   . Stroke ([place])   . TIA (transient ischemic attack)    x2 - on Plavix for this  . Torn rotator cuff    right worse than left, both are torn  . Tumor, 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  . Type II diabetes mellitus ([place])   . 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 Vaginal dysplasia     Past Surgical History:  Procedure Laterality Date  . ABDOMINAL HYSTERECTOMY  1980   NO oophorectomy per pt   . ANTERIOR CERVICAL DECOMP/DISCECTOMY FUSION  2001   C 3, C4 and C5 plate and screws  . BREAST BIOPSY Right 1999  . BUNIONECTOMY Left ~ 1977  . CARDIAC CATHETERIZATION     2018 By Dr. Pernell Dupre (done after colon surgery)  . CATARACT EXTRACTION W/ INTRAOCULAR LENS  IMPLANT, BILATERAL  2012  . COLON SURGERY  10/2014  . EYE SURGERY Bilateral    torq lens for cataracts  . LEFT HEART CATH AND CORONARY ANGIOGRAPHY N/A 03/22/2016   Procedure: Left Heart Cath and Coronary Angiography;  Surgeon: Mallie Mussel  Nicholes Stairs, MD;  Location: [place] [facility];  Service: Cardiovascular;  Laterality: N/A;  . LUMBAR LAMINECTOMY/DECOMPRESSION MICRODISCECTOMY N/A 01/20/2018   Procedure: L4-5 decompression;  Surgeon: Marybelle Killings, MD;  Location: [facility];  Service: Orthopedics;  Laterality: N/A;  . THYROIDECTOMY, PARTIAL  1960's  . TOTAL KNEE ARTHROPLASTY Right 12/04/2019   Procedure: RIGHT TOTAL KNEE ARTHROPLASTY;  Surgeon: Marybelle Killings, MD;  Location: [facility];  Service: Orthopedics;  Laterality: Right;  RNFA [place]   "Laser surgery for vaginal cancer; followed by chemotherapy" (06/27/2012)    There were no vitals filed for this visit.    Subjective Assessment - 02/09/20 1152    Subjective Returns to OPPT after last session in Nov 2021 following Rt TKA on 12/04/19.  Since then pt hospitalized with AKI and is slowly recovering from this.  She reports continued reports of  weakness with a couple episodes of buckling with prolonged standing.  She also reports decreased endurance.    Pertinent History PMH: Rt TKA 12/04/19. afib, CAD,CHF,fibromyalgia,neck fusion, lumbar decompression sx    Limitations Walking;House hold activities;Standing;Lifting    How long can you stand comfortably? mup to 30 min    Patient Stated Goals improve strength and enduranc    Currently in Pain? Yes    Pain Score 0-No pain   up to 10/10   Pain Location Knee    Pain Orientation Right    Pain Descriptors / Indicators Aching;Sharp    Pain Type Surgical pain;Chronic pain;Acute pain    Pain Onset 1 to 4 weeks ago    Pain Frequency Intermittent    Aggravating Factors  standing > 30 min    Pain Relieving Factors rest, ice, pain meds              [facility] PT Assessment - 02/09/20 1156      Assessment   Medical Diagnosis S/P Rt TKA 12/04/19    Referring Provider (PT) Rodell Perna    Onset Date/Surgical Date 12/04/19    Hand Dominance Right    Next MD Visit 02/12/20    Prior Therapy [facility]      Precautions   Precautions None      Balance Screen   Has the patient fallen in the past 6 months No    Has the patient had a decrease in activity level because of a fear of falling?  No    Is the patient reluctant to leave their home because of a fear of falling?  No      Home Environment   Living Environment Private residence    Additional Comments has ramp to enter      Prior Function   Level of Independence Independent with basic ADLs    Vocation Retired    Software engineer, read, Agricultural consultant; doing exercises that Dr. Lorin Mercy      Cognition   Overall Cognitive Status Within Functional Limits for tasks assessed      Observation/Other Assessments   Focus on Therapeutic Outcomes (FOTO)  46      AROM   Right Knee Extension 0    Right Knee Flexion 118      Strength   Right Knee Flexion 3+/5    Right Knee Extension 4/5      Ambulation/Gait   Ambulation/Gait Yes    Ambulation/Gait  Assistance 6: Modified independent (Device/Increase time)    Ambulation Distance (Feet) 100 Feet    Assistive device Rolling walker  Gait Pattern Decreased step length - right;Decreased step length - left;Decreased stance time - right;Decreased stride length;Decreased hip/knee flexion - right    Gait velocity decreased      Standardized Balance Assessment   Standardized Balance Assessment Five Times Sit to Stand    Five times sit to stand comments  26.12 sec without UE support                      Objective measurements completed on examination: See above findings.       [facility] PT Treatment/Exercise - 02/09/20 1156      Exercises   Exercises Other Exercises    Other Exercises  reviewed/updated prior HEP - see pt instructions.  pt verbalized understanding                  PT Education - 02/09/20 1247    Education Details reviewed/updated HEP    Person(s) Educated Patient    Methods Explanation;Handout;Demonstration    Comprehension Verbalized understanding;Need further instruction            PT Short Term Goals - 02/09/20 1251      PT SHORT TERM GOAL #1   Title Pt will be I and compliant with HEP.    Time 4    Period Weeks    Status Achieved    Target Date 01/15/20      PT SHORT TERM GOAL #2   Title Pt will improve FOTO score to 30% funciton    Time 4    Period Weeks    Status Achieved      PT SHORT TERM GOAL #3   Title -      PT SHORT TERM GOAL #4   Title -             PT Long Term Goals - 02/09/20 1251      PT LONG TERM GOAL #1   Title Pt will improve FOTO score to at least 55% function.    Time 6    Period Weeks    Status New    Target Date 03/22/20      PT LONG TERM GOAL #2   Title Pt will improve Rt knee ROM 0-115 deg to improve funciton.    Time 8    Period Weeks    Status Achieved      PT LONG TERM GOAL #3   Title Pt will improve Rt knee strength to 5/5 MMT to improve function.    Time 6    Period Weeks     Status New    Target Date 03/22/20      PT LONG TERM GOAL #4   Title Pt will improve 5TSTS to less than 18 seconds    Time 6    Period Weeks    Status New    Target Date 03/22/20      PT LONG TERM GOAL #5   Title Pt will be able to ambulate 500' over even/uneven terrain with LRAD at MOD I level    Time 6    Period Weeks    Status New    Target Date 03/22/20                  Plan - 02/09/20 1248    Clinical Impression Statement Pt returns to [facility] today following medical complications including hospitalization.  Her Rt knee has excellent ROM, and demonstrates mild strength deficits, gait abnormalities and decreased endurance.  She  will benefit from continued PT 2x/wk x 4-6 more weeks to address deficits listed.    Personal Factors and Comorbidities Comorbidity 3+    Comorbidities PMH: afib, CAD,CHF,fibromyalgia,neck fusion, lumbar decompression sx    Examination-Activity Limitations Bend;Carry;Lift;Stand;Squat;Stairs;Sleep;Locomotion Level    Examination-Participation Restrictions Community Activity;Driving;Laundry;Yard Work;Shop;Cleaning    Stability/Clinical Decision Making Evolving/Moderate complexity    Rehab Potential Good    PT Frequency 2x / week   2-3   PT Duration 6 weeks   4-6 weeks   PT Treatment/Interventions ADLs/Self Care Home Management;Cryotherapy;Electrical Stimulation;Iontophoresis 4mg /ml Dexamethasone;Moist Heat;Ultrasound;Gait training;Stair training;Therapeutic activities;Therapeutic exercise;Balance training;Neuromuscular re-education;Manual techniques;Passive range of motion;Dry needling;Joint Manipulations;Spinal Manipulations;Vasopneumatic Device;Taping    PT Next Visit Plan review HEP, work on strengthening, balance and endurance; gait with cane/no AD if able given recent BP issues    PT Home Exercise Plan Access Code: [email or web address]    Consulted and Agree with Plan of Care Patient           Patient will benefit from skilled therapeutic intervention  in order to improve the following deficits and impairments:  Abnormal gait,Decreased activity tolerance,Decreased mobility,Decreased endurance,Decreased range of motion,Decreased strength,Increased edema,Difficulty walking,Increased fascial restricitons,Increased muscle spasms,Impaired flexibility,Pain,Postural dysfunction,Improper body mechanics  Visit Diagnosis: Difficulty in walking, not elsewhere classified - Plan: PT plan of care cert/re-cert  Localized edema - Plan: PT plan of care cert/re-cert  Muscle weakness (generalized) - Plan: PT plan of care cert/re-cert  Stiffness of right knee, not elsewhere classified - Plan: PT plan of care cert/re-cert  Acute pain of right knee - Plan: PT plan of care cert/re-cert  Other abnormalities of gait and mobility - Plan: PT plan of care cert/re-cert     Problem List Patient Active Problem List   Diagnosis Date Noted  . Abnormal urine color 01/07/2020  . AKI (acute kidney injury) ([place]) 01/07/2020  . Epigastric pain 01/07/2020  . 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 Aortic atherosclerosis ([place]) 11/03/2019  . Posterior vitreous detachment of right eye 07/15/2019  . Posterior vitreous detachment of left eye 07/15/2019  . 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 Diabetes mellitus without complication ([place]) [id number]  . 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 Raynaud phenomenon 05/06/2019  . Trigger finger, left ring finger 03/18/2019  . 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 01/28/2018  . Sacral back pain 10/16/2017  . Chronic left upper quadrant pain 07/23/2017  . Rectal bleeding 07/23/2017  . Poor balance 03/14/2017  . Right shoulder pain 03/14/2017  . Lower abdominal pain 04/18/2016  . Angina pectoris ([place]) 03/22/2016  . Hypertensive heart disease 07/07/2015  . Essential hypertension 07/07/2015  . Lightheadedness 07/07/2015  . Plantar fasciitis, left 03/22/2015  . Coronary  artery disease due to lipid rich plaque 01/05/2015  . Chronic diastolic CHF (congestive heart failure), NYHA class 2 ([place]) 01/05/2015  . 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 Migraine ([place]) 01/05/2014  . VBI (vertebrobasilar insufficiency) 08/22/2012  . TIA (transient ischemic attack) 06/27/2012  . DJD (degenerative joint disease) 02/02/2011  . 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 Postherpetic neuralgia ? 01/02/2010  . Palpitations 11/23/2008  . UTI'S, RECURRENT 09/28/2008  . Fibromyalgia 08/15/2007  . 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 Hyperlipidemia 05/21/2006  . Reactive airway disease 01/29/2002      Laureen Abrahams, PT, DPT 02/09/20 12:54 PM    [facility] [address] [place], [place], [address]  Phone: [phone]   Fax:  [phone]  Name: Samantha [name] MRN: [id number] Date of Birth: 1940-09-24

## 2020-02-11 ENCOUNTER — Other Ambulatory Visit: Payer: Self-pay

## 2020-02-11 MED ORDER — CHOLESTYRAMINE 4 G PO PACK
4.0000 g | PACK | Freq: Every day | ORAL | 2 refills | Status: DC
Start: 2020-02-11 — End: 2020-04-14

## 2020-02-12 ENCOUNTER — Encounter: Payer: Self-pay | Admitting: Orthopaedic Surgery

## 2020-02-12 ENCOUNTER — Ambulatory Visit (INDEPENDENT_AMBULATORY_CARE_PROVIDER_SITE_OTHER): Payer: Medicare Other | Admitting: Orthopaedic Surgery

## 2020-02-12 VITALS — BP 161/83 | HR 71 | Ht 62.0 in | Wt 196.0 lb

## 2020-02-12 DIAGNOSIS — Z96651 Presence of right artificial knee joint: Secondary | ICD-10-CM

## 2020-02-12 MED ORDER — TIZANIDINE HCL 4 MG PO TABS
4.0000 mg | ORAL_TABLET | Freq: Two times a day (BID) | ORAL | 0 refills | Status: DC | PRN
Start: 2020-02-12 — End: 2020-10-20

## 2020-02-12 NOTE — Progress Notes (Signed)
Post-Op Visit Note   Patient: Samantha [name]           Date of Birth: [birth year]           MRN: [id number] Visit Date: 02/12/2020 PCP: [name], MD   Assessment & Plan: Post right total knee arthroplasty she has full extension she can take can resistance with straight leg raising.  Flexion to 120.  She is ambulating in the community with a cane good pain relief from preop.  She requested 1 final refill of TENS and edema which was sent in.  Opposite knee is not giving her much trouble and she is very happy with the surgical result she is finishing up her therapy visits.  Chief Complaint:  Chief Complaint  Patient presents with  . Right Knee - Follow-up    12/04/2019 Right TKA   Visit Diagnoses:  1. S/P TKR (total knee replacement), right     Plan: Finish therapy she will transition to working out in a gym.  Follow-up with me as needed.  Follow-Up Instructions: Return if symptoms worsen or fail to improve.   Orders:  No orders of the defined types were placed in this encounter.  Meds ordered this encounter  Medications  . tiZANidine (ZANAFLEX) 4 MG tablet    Sig: Take 1 tablet (4 mg total) by mouth 2 (two) times daily as needed for muscle spasms.    Dispense:  40 tablet    Refill:  0    Imaging: No results found.  PMFS History: Patient Active Problem List   Diagnosis Date Noted  . Abnormal urine color 01/07/2020  . AKI (acute kidney injury) ([place]) 01/07/2020  . Epigastric pain 01/07/2020  . 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 Aortic atherosclerosis ([place]) 11/03/2019  . Posterior vitreous detachment of right eye 07/15/2019  . Posterior vitreous detachment of left eye 07/15/2019  . 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 Diabetes mellitus without complication ([place]) [id number]  . 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 Raynaud phenomenon 05/06/2019  . Trigger finger, left ring finger 03/18/2019  . Status post lumbar spine operative  procedure for decompression of spinal cord 01/28/2018  . Sacral back pain 10/16/2017  . Chronic left upper quadrant pain 07/23/2017  . Rectal bleeding 07/23/2017  . Poor balance 03/14/2017  . Right shoulder pain 03/14/2017  . Lower abdominal pain 04/18/2016  . Angina pectoris ([place]) 03/22/2016  . Hypertensive heart disease 07/07/2015  . Essential hypertension 07/07/2015  . Lightheadedness 07/07/2015  . Plantar fasciitis, left 03/22/2015  . Coronary artery disease due to lipid rich plaque 01/05/2015  . Chronic diastolic CHF (congestive heart failure), NYHA class 2 ([place]) 01/05/2015  . 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 Migraine ([place]) 01/05/2014  . VBI (vertebrobasilar insufficiency) 08/22/2012  . TIA (transient ischemic attack) 06/27/2012  . DJD (degenerative joint disease) 02/02/2011  . 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 Postherpetic neuralgia ? 01/02/2010  . Palpitations 11/23/2008  . UTI'S, RECURRENT 09/28/2008  . Fibromyalgia 08/15/2007  . 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 Hyperlipidemia 05/21/2006  . Reactive airway disease 01/29/2002   Past Medical History:  Diagnosis Date  . A-fib ([place])   . Abnormal CT of the chest 2008   last CT4-l 2009:  . No f/u suggested   . Allergy   . Asthma   . CAD (coronary artery disease)    a. Coronary  CTA 10/16: Coronary Ca score 211, mod non-obstructive CAD with LM mild plaque (25-50%), mid LAD 50-69%. b. Neg nuc 06/2015.  . Cataract    BILATERAL-REMOVED  . Chronic diastolic CHF (congestive heart failure) ([place])   . Collagen vascular disease ([place])    "arterial sclerosis" per pt  . Complication of anesthesia    trouble waking up  . Fibromyalgia   . GERD (gastroesophageal reflux disease)   . H/O hiatal hernia   . Heart murmur   . Hyperlipidemia   . Hypertension   . Malignant neoplasm of ascending colon ([place]) 2016   Minimally invasive right  hemicolectomy to be done   . Neuromuscular disorder ([facility])    FIBROMYALGIA  . Ocular migraine   . OSA (obstructive sleep apnea) 09/2007   dx w/ a sleep study, not on  CPAP  . Osteoarthritis   . Osteoporosis   . Pneumonia    "double" in 2004  . PONV (postoperative nausea and vomiting)   . Reactive airway disease 01/29/2002   dx of pseudoasthma / vcd in 2005 and nl sprirometry History of dyspnea, 2011,  improved after several medications were changed around Question of COPD, disproved July 06, 2009 with nl pft's      . Rheumatoid factor positive   . Shingles 11/2009  . Sleep apnea   . Stroke ([place])   . TIA (transient ischemic attack)    x2 - on Plavix for this  . Torn rotator cuff    right worse than left, both are torn  . Tumor, 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  . Type II diabetes mellitus ([place])   . 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 Vaginal dysplasia     Family History  Problem Relation Age of Onset  . Heart disease Father   . Heart disease Mother   . Lung cancer Mother   . Allergies Sister   . Parkinsonism Sister        possible  . Asthma Sister   . Asthma Paternal Grandmother   . Stroke Paternal Grandmother   . Heart disease Other        paternal grandparents, maternal grandparents,   . Heart disease Brother   . Emphysema Brother   . Aneurysm Brother        x3  . Kidney failure Brother   . Diabetes Brother   . Diabetes Brother   . Stroke Brother   . Stroke Maternal Grandmother   . Breast cancer Neg Hx   . Colon cancer Neg Hx   . Heart attack Neg Hx   . Esophageal cancer Neg Hx   . Rectal cancer Neg Hx   . Stomach cancer Neg Hx     Past Surgical History:  Procedure Laterality Date  . ABDOMINAL HYSTERECTOMY  1980   NO oophorectomy per pt   . ANTERIOR CERVICAL DECOMP/DISCECTOMY FUSION  2001   C 3, C4 and C5 plate and screws  . BREAST BIOPSY Right 1999  . BUNIONECTOMY Left ~ 1977  . CARDIAC CATHETERIZATION     2018 By Dr. Pernell Dupre (done after colon  surgery)  . CATARACT EXTRACTION W/ INTRAOCULAR LENS  IMPLANT, BILATERAL  2012  . COLON SURGERY  10/2014  . EYE SURGERY Bilateral    torq lens for cataracts  . LEFT HEART CATH AND CORONARY ANGIOGRAPHY N/A 03/22/2016   Procedure: Left Heart Cath and Coronary Angiography;  Surgeon: Belva Crome, MD;  Location: [facility];  Service: Cardiovascular;  Laterality: N/A;  . LUMBAR LAMINECTOMY/DECOMPRESSION MICRODISCECTOMY N/A 01/20/2018   Procedure: L4-5 decompression;  Surgeon: Marybelle Killings, MD;  Location: [facility];  Service: Orthopedics;  Laterality: N/A;  . THYROIDECTOMY, PARTIAL  1960's  . TOTAL KNEE ARTHROPLASTY Right 12/04/2019   Procedure: RIGHT TOTAL KNEE ARTHROPLASTY;  Surgeon: Marybelle Killings, MD;  Location: [place];  Service: Orthopedics;  Laterality: Right;  RNFA [place]   "Laser surgery for vaginal cancer; followed by chemotherapy" (06/27/2012)   Social History   Occupational History  . Occupation: Retired, disable since 2000    Employer: RETIRED  Tobacco Use  . Smoking status: Former Smoker    Packs/day: 0.25    Years: 5.00    Pack years: 1.25    Types: Cigarettes    Quit date: 01/29/1998    Years since quitting: 22.0  . Smokeless tobacco: Never Used  . Tobacco comment: Quit in 2001  Vaping Use  . Vaping Use: Never used  Substance and Sexual Activity  . Alcohol use: No    Alcohol/week: 0.0 standard drinks  . Drug use: No  . Sexual activity: Never

## 2020-02-15 ENCOUNTER — Encounter: Payer: Self-pay | Admitting: Physical Therapy

## 2020-02-18 ENCOUNTER — Ambulatory Visit (INDEPENDENT_AMBULATORY_CARE_PROVIDER_SITE_OTHER): Payer: Medicare Other | Admitting: Rehabilitative and Restorative Service Providers"

## 2020-02-18 ENCOUNTER — Other Ambulatory Visit: Payer: Self-pay

## 2020-02-18 ENCOUNTER — Encounter: Payer: Self-pay | Admitting: Rehabilitative and Restorative Service Providers"

## 2020-02-18 DIAGNOSIS — G8929 Other chronic pain: Secondary | ICD-10-CM

## 2020-02-18 DIAGNOSIS — M6281 Muscle weakness (generalized): Secondary | ICD-10-CM

## 2020-02-18 DIAGNOSIS — R262 Difficulty in walking, not elsewhere classified: Secondary | ICD-10-CM | POA: Diagnosis not present

## 2020-02-18 DIAGNOSIS — M25661 Stiffness of right knee, not elsewhere classified: Secondary | ICD-10-CM

## 2020-02-18 DIAGNOSIS — R600 Localized edema: Secondary | ICD-10-CM

## 2020-02-18 DIAGNOSIS — M25561 Pain in right knee: Secondary | ICD-10-CM

## 2020-02-18 NOTE — Therapy (Signed)
[facility] [place], [place], [address] Phone: [phone]   Fax:  [phone]  Physical Therapy Treatment  Patient Details  Name: Samantha [name] MRN: [id number] Date of Birth: 12-21-1940 Referring Provider (PT): Rodell Perna   Encounter Date: 02/18/2020   PT End of Session - 02/18/20 1653    Visit Number 5    Number of Visits 16    Date for PT Re-Evaluation 03/22/20    Authorization Type Medicare/Tricare    PT Start Time 1145    PT Stop Time 1230    PT Time Calculation (min) 45 min    Activity Tolerance Patient tolerated treatment well;No increased pain;Patient limited by fatigue    Behavior During Therapy [facility] for tasks assessed/performed           Past Medical History:  Diagnosis Date  . A-fib ([place])   . Abnormal CT of the chest 2008   last CT4-l 2009:  . No f/u suggested   . Allergy   . Asthma   . CAD (coronary artery disease)    a. Coronary CTA 10/16: Coronary Ca score 211, mod non-obstructive CAD with LM mild plaque (25-50%), mid LAD 50-69%. b. Neg nuc 06/2015.  . Cataract    BILATERAL-REMOVED  . Chronic diastolic CHF (congestive heart failure) ([place])   . Collagen vascular disease ([place])    "arterial sclerosis" per pt  . Complication of anesthesia    trouble waking up  . Fibromyalgia   . GERD (gastroesophageal reflux disease)   . H/O hiatal hernia   . Heart murmur   . Hyperlipidemia   . Hypertension   . Malignant neoplasm of ascending colon ([place]) 2016   Minimally invasive right hemicolectomy to be done   . Neuromuscular disorder (HCC)    FIBROMYALGIA  . Ocular migraine   . OSA (obstructive sleep apnea) 09/2007   dx w/ a sleep study, not on  CPAP  . Osteoarthritis   . Osteoporosis   . Pneumonia    "double" in 2004  . PONV (postoperative nausea and vomiting)   . Reactive airway disease 01/29/2002   dx of pseudoasthma / vcd in 2005 and nl sprirometry History of dyspnea, 2011,  improved after several  medications were changed around Question of COPD, disproved July 06, 2009 with nl pft's      . Rheumatoid factor positive   . Shingles 11/2009  . Sleep apnea   . Stroke ([place])   . TIA (transient ischemic attack)    x2 - on Plavix for this  . Torn rotator cuff    right worse than left, both are torn  . Tumor, 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  . Type II diabetes mellitus ([place])   . 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 Vaginal dysplasia     Past Surgical History:  Procedure Laterality Date  . ABDOMINAL HYSTERECTOMY  1980   NO oophorectomy per pt   . ANTERIOR CERVICAL DECOMP/DISCECTOMY FUSION  2001   C 3, C4 and C5 plate and screws  . BREAST BIOPSY Right 1999  . BUNIONECTOMY Left ~ 1977  . CARDIAC CATHETERIZATION     2018 By Dr. Pernell Dupre (done after colon surgery)  . CATARACT EXTRACTION W/ INTRAOCULAR LENS  IMPLANT, BILATERAL  2012  . COLON SURGERY  10/2014  . EYE SURGERY Bilateral    torq lens for cataracts  . LEFT HEART CATH AND CORONARY ANGIOGRAPHY N/A 03/22/2016   Procedure: Left Heart Cath and  Coronary Angiography;  Surgeon: Belva Crome, MD;  Location: [facility];  Service: Cardiovascular;  Laterality: N/A;  . LUMBAR LAMINECTOMY/DECOMPRESSION MICRODISCECTOMY N/A 01/20/2018   Procedure: L4-5 decompression;  Surgeon: Marybelle Killings, MD;  Location: [place];  Service: Orthopedics;  Laterality: N/A;  . THYROIDECTOMY, PARTIAL  1960's  . TOTAL KNEE ARTHROPLASTY Right 12/04/2019   Procedure: RIGHT TOTAL KNEE ARTHROPLASTY;  Surgeon: Marybelle Killings, MD;  Location: [facility];  Service: Orthopedics;  Laterality: Right;  RNFA [place]   "Laser surgery for vaginal cancer; followed by chemotherapy" (06/27/2012)    There were no vitals filed for this visit.   Subjective Assessment - 02/18/20 1650    Subjective Akshadha is making great progress with her AROM and self-reported function post TKA.    Pertinent History PMH: Rt TKA 12/04/19. afib,  CAD,CHF,fibromyalgia,neck fusion, lumbar decompression sx    Limitations Walking;House hold activities;Standing;Lifting    How long can you stand comfortably? up to 30 min    Patient Stated Goals improve strength and endurance    Currently in Pain? No/denies    Pain Onset 1 to 4 weeks ago              [facility] PT Assessment - 02/18/20 0001      AROM   Right Knee Extension 0    Right Knee Flexion 122                         [facility] Adult PT Treatment/Exercise - 02/18/20 0001      Therapeutic Activites    Therapeutic Activities ADL's    ADL's Step-up and over 8 inch step with handrail and with cane slow eccentrics good technique 10X each leg each exercise      Neuro Re-ed    Neuro Re-ed Details  Heel to toe balance eyes open and closed/narrow stance 3X each  20 seconds and dynamic narrow gait without an assistive device      Exercises   Exercises Knee/Hip      Knee/Hip Exercises: Stretches   Knee: Self-Stretch Limitations --      Knee/Hip Exercises: Aerobic   Stationary Bike Seat 6 and 5 for 4 minutes each      Knee/Hip Exercises: Seated   Sit to Sand 2 sets;10 reps;without UE support   slow eccentrics     Manual Therapy   Manual Therapy --                  PT Education - 02/18/20 1651    Education Details Reviewed HEP with emphasis on quadriceps strengthening.    Person(s) Educated Patient    Methods Explanation;Demonstration;Verbal cues    Comprehension Need further instruction;Verbalized understanding;Returned demonstration;Verbal cues required            PT Short Term Goals - 02/18/20 1651      PT SHORT TERM GOAL #1   Title Pt will be I and compliant with HEP.    Time 4    Period Weeks    Status Achieved    Target Date 01/15/20      PT SHORT TERM GOAL #2   Title Pt will improve FOTO score to 30% funciton    Time 4    Period Weeks    Status Achieved      PT SHORT TERM GOAL #3   Title -      PT SHORT TERM GOAL #4  Title -              PT Long Term Goals - 02/18/20 1652      PT LONG TERM GOAL #1   Title Pt will improve FOTO score to at least 55% function.    Time 6    Period Weeks    Status On-going      PT LONG TERM GOAL #2   Title Pt will improve Rt knee ROM 0-115 deg to improve funciton.    Time 8    Period Weeks    Status Achieved      PT LONG TERM GOAL #3   Title Pt will improve Rt knee strength to 5/5 MMT to improve function.    Time 6    Period Weeks    Status On-going      PT LONG TERM GOAL #4   Title Pt will improve 5TSTS to less than 18 seconds    Time 6    Period Weeks    Status On-going      PT LONG TERM GOAL #5   Title Pt will be able to ambulate 500' over even/uneven terrain with LRAD at MOD I level    Time 6    Period Weeks    Status On-going                 Plan - 02/18/20 1653    Clinical Impression Statement Ixel has excellent AROM.  Strength, balance, gait quality and endurance will benefit from continued work as her recent hospitilization has negatively affected her strength and endurance.  With return to the recommended rehabilitation schedule, her prognosis is excellent.    Personal Factors and Comorbidities Comorbidity 3+    Comorbidities PMH: afib, CAD,CHF,fibromyalgia,neck fusion, lumbar decompression sx    Examination-Activity Limitations Bend;Carry;Lift;Stand;Squat;Stairs;Sleep;Locomotion Level    Examination-Participation Restrictions Community Activity;Driving;Laundry;Yard Work;Shop;Cleaning    Stability/Clinical Decision Making Evolving/Moderate complexity    Rehab Potential Good    PT Frequency 2x / week   2-3   PT Duration 6 weeks   4-6 weeks   PT Treatment/Interventions ADLs/Self Care Home Management;Cryotherapy;Electrical Stimulation;Iontophoresis 4mg /ml Dexamethasone;Moist Heat;Ultrasound;Gait training;Stair training;Therapeutic activities;Therapeutic exercise;Balance training;Neuromuscular re-education;Manual techniques;Passive range of  motion;Dry needling;Joint Manipulations;Spinal Manipulations;Vasopneumatic Device;Taping    PT Next Visit Plan review HEP, work on strengthening, balance and endurance; gait with cane/no AD if able given recent BP issues    PT Home Exercise Plan Access Code: [email or web address]    Consulted and Agree with Plan of Care Patient           Patient will benefit from skilled therapeutic intervention in order to improve the following deficits and impairments:  Abnormal gait,Decreased activity tolerance,Decreased mobility,Decreased endurance,Decreased range of motion,Decreased strength,Increased edema,Difficulty walking,Increased fascial restricitons,Increased muscle spasms,Impaired flexibility,Pain,Postural dysfunction,Improper body mechanics  Visit Diagnosis: Difficulty in walking, not elsewhere classified  Muscle weakness (generalized)  Stiffness of right knee, not elsewhere classified  Chronic pain of right knee  Localized edema     Problem List Patient Active Problem List   Diagnosis Date Noted  . Abnormal urine color 01/07/2020  . AKI (acute kidney injury) ([place]) 01/07/2020  . Epigastric pain 01/07/2020  . 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 Aortic atherosclerosis ([place]) 11/03/2019  . Posterior vitreous detachment of right eye 07/15/2019  . Posterior vitreous detachment of left eye 07/15/2019  . 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 Diabetes mellitus without complication ([place]) 70/01/7492  . 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 Raynaud phenomenon 05/06/2019  . Trigger  finger, left ring finger 03/18/2019  . 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 01/28/2018  . Sacral back pain 10/16/2017  . Chronic left upper quadrant pain 07/23/2017  . Rectal bleeding 07/23/2017  . Poor balance 03/14/2017  . Right shoulder pain 03/14/2017  . Lower abdominal pain 04/18/2016  . Angina pectoris ([place]) 03/22/2016  . Hypertensive heart disease  07/07/2015  . Essential hypertension 07/07/2015  . Lightheadedness 07/07/2015  . Plantar fasciitis, left 03/22/2015  . Coronary artery disease due to lipid rich plaque 01/05/2015  . Chronic diastolic CHF (congestive heart failure), NYHA class 2 ([place]) 01/05/2015  . 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 Migraine ([place]) 01/05/2014  . VBI (vertebrobasilar insufficiency) 08/22/2012  . TIA (transient ischemic attack) 06/27/2012  . DJD (degenerative joint disease) 02/02/2011  . 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 Postherpetic neuralgia ? 01/02/2010  . Palpitations 11/23/2008  . UTI'S, RECURRENT 09/28/2008  . Fibromyalgia 08/15/2007  . 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 Hyperlipidemia 05/21/2006  . Reactive airway disease 01/29/2002    Farley Ly PT, MPT 02/18/2020, 4:57 PM  [facility] [address] [place], [place], [address] Phone: [phone]   Fax:  [phone]  Name: ANNAJULIA FRETWELL MRN: [id number] Date of Birth: 09-24-1940

## 2020-02-23 ENCOUNTER — Other Ambulatory Visit: Payer: Self-pay

## 2020-02-23 ENCOUNTER — Ambulatory Visit (INDEPENDENT_AMBULATORY_CARE_PROVIDER_SITE_OTHER): Payer: Medicare Other | Admitting: Physical Therapy

## 2020-02-23 DIAGNOSIS — G8929 Other chronic pain: Secondary | ICD-10-CM | POA: Diagnosis not present

## 2020-02-23 DIAGNOSIS — R600 Localized edema: Secondary | ICD-10-CM

## 2020-02-23 DIAGNOSIS — M6281 Muscle weakness (generalized): Secondary | ICD-10-CM | POA: Diagnosis not present

## 2020-02-23 DIAGNOSIS — M25561 Pain in right knee: Secondary | ICD-10-CM | POA: Diagnosis not present

## 2020-02-23 DIAGNOSIS — R262 Difficulty in walking, not elsewhere classified: Secondary | ICD-10-CM | POA: Diagnosis not present

## 2020-02-23 DIAGNOSIS — M25661 Stiffness of right knee, not elsewhere classified: Secondary | ICD-10-CM

## 2020-02-23 NOTE — Therapy (Signed)
[place], [place], [address] Phone: [phone]   Fax:  [phone]  Physical Therapy Treatment  Patient Details  Name: Samantha [name] MRN: [id number] Date of Birth: [birth year] Referring Provider (PT): Rodell Perna   Encounter Date: 02/23/2020   PT End of Session - 02/23/20 1143    Visit Number 6    Number of Visits 16    Date for PT Re-Evaluation 03/22/20    Authorization Type Medicare/Tricare    PT Start Time 1103    PT Stop Time 1145    PT Time Calculation (min) 42 min    Activity Tolerance Patient tolerated treatment well;No increased pain;Patient limited by fatigue    Behavior During Therapy [facility] for tasks assessed/performed           Past Medical History:  Diagnosis Date  . A-fib ([place])   . Abnormal CT of the chest 2008   last CT4-l 2009:  . No f/u suggested   . Allergy   . Asthma   . CAD (coronary artery disease)    a. Coronary CTA 10/16: Coronary Ca score 211, mod non-obstructive CAD with LM mild plaque (25-50%), mid LAD 50-69%. b. Neg nuc 06/2015.  . Cataract    BILATERAL-REMOVED  . Chronic diastolic CHF (congestive heart failure) ([place])   . Collagen vascular disease ([place])    "arterial sclerosis" per pt  . Complication of anesthesia    trouble waking up  . Fibromyalgia   . GERD (gastroesophageal reflux disease)   . H/O hiatal hernia   . Heart murmur   . Hyperlipidemia   . Hypertension   . Malignant neoplasm of ascending colon ([place]) 2016   Minimally invasive right hemicolectomy to be done   . Neuromuscular disorder (HCC)    FIBROMYALGIA  . Ocular migraine   . OSA (obstructive sleep apnea) 09/2007   dx w/ a sleep study, not on  CPAP  . Osteoarthritis   . Osteoporosis   . Pneumonia    "double" in 2004  . PONV (postoperative nausea and vomiting)   . Reactive airway disease 01/29/2002   dx of pseudoasthma / vcd in 2005 and nl sprirometry History of dyspnea, 2011,  improved after several  medications were changed around Question of COPD, disproved July 06, 2009 with nl pft's      . Rheumatoid factor positive   . Shingles 11/2009  . Sleep apnea   . Stroke ([place])   . TIA (transient ischemic attack)    x2 - on Plavix for this  . Torn rotator cuff    right worse than left, both are torn  . Tumor, 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  . Type II diabetes mellitus ([place])   . 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 Vaginal dysplasia     Past Surgical History:  Procedure Laterality Date  . ABDOMINAL HYSTERECTOMY  1980   NO oophorectomy per pt   . ANTERIOR CERVICAL DECOMP/DISCECTOMY FUSION  2001   C 3, C4 and C5 plate and screws  . BREAST BIOPSY Right 1999  . BUNIONECTOMY Left ~ 1977  . CARDIAC CATHETERIZATION     2018 By Dr. Pernell Dupre (done after colon surgery)  . CATARACT EXTRACTION W/ INTRAOCULAR LENS  IMPLANT, BILATERAL  2012  . COLON SURGERY  10/2014  . EYE SURGERY Bilateral    torq lens for cataracts  . LEFT HEART CATH AND CORONARY ANGIOGRAPHY N/A 03/22/2016   Procedure: Left Heart Cath and  Coronary Angiography;  Surgeon: Belva Crome, MD;  Location: [facility];  Service: Cardiovascular;  Laterality: N/A;  . LUMBAR LAMINECTOMY/DECOMPRESSION MICRODISCECTOMY N/A 01/20/2018   Procedure: L4-5 decompression;  Surgeon: Marybelle Killings, MD;  Location: [place];  Service: Orthopedics;  Laterality: N/A;  . THYROIDECTOMY, PARTIAL  1960's  . TOTAL KNEE ARTHROPLASTY Right 12/04/2019   Procedure: RIGHT TOTAL KNEE ARTHROPLASTY;  Surgeon: Marybelle Killings, MD;  Location: [facility];  Service: Orthopedics;  Laterality: Right;  RNFA [place]   "Laser surgery for vaginal cancer; followed by chemotherapy" (06/27/2012)    There were no vitals filed for this visit.   Subjective Assessment - 02/23/20 1129    Subjective She relays overall her knee is doing well and and she is walking a lot more without her SPC.    Pertinent History PMH: Rt TKA 12/04/19. afib,  CAD,CHF,fibromyalgia,neck fusion, lumbar decompression sx    Limitations Walking;House hold activities;Standing;Lifting    How long can you stand comfortably? up to 30 min    Patient Stated Goals improve strength and endurance    Pain Onset 1 to 4 weeks ago            [facility] Adult PT Treatment/Exercise - 02/23/20 0001      Neuro Re-ed    Neuro Re-ed Details  Heel to toe balance 30 sec X 2 bilat, march walking 4 round trips in bars, sidestepping on foam beam 4 round trips in bars      Knee/Hip Exercises: Stretches   Active Hamstring Stretch Right;2 reps;30 seconds    Knee: Self-Stretch Limitations Tailgate knee flexion AROM 2 minutes      Knee/Hip Exercises: Aerobic   Nustep L5 X 10  min UE/LE      Knee/Hip Exercises: Machines for Strengthening   Cybex Knee Extension bilat 5 lbs 2X10    Cybex Knee Flexion bilat 25 lbs 2 sets of 10    Total Gym Leg Press bilat 62lbs 2x10, Rt only 25lbs 2 sets of 10      Knee/Hip Exercises: Seated   Sit to Sand 10 reps;without UE support;1 set                    PT Short Term Goals - 02/18/20 1651      PT SHORT TERM GOAL #1   Title Pt will be I and compliant with HEP.    Time 4    Period Weeks    Status Achieved    Target Date 01/15/20      PT SHORT TERM GOAL #2   Title Pt will improve FOTO score to 30% funciton    Time 4    Period Weeks    Status Achieved      PT SHORT TERM GOAL #3   Title -      PT SHORT TERM GOAL #4   Title -             PT Long Term Goals - 02/18/20 1652      PT LONG TERM GOAL #1   Title Pt will improve FOTO score to at least 55% function.    Time 6    Period Weeks    Status On-going      PT LONG TERM GOAL #2   Title Pt will improve Rt knee ROM 0-115 deg to improve funciton.    Time 8    Period Weeks    Status Achieved  PT LONG TERM GOAL #3   Title Pt will improve Rt knee strength to 5/5 MMT to improve function.    Time 6    Period Weeks    Status On-going      PT LONG TERM  GOAL #4   Title Pt will improve 5TSTS to less than 18 seconds    Time 6    Period Weeks    Status On-going      PT LONG TERM GOAL #5   Title Pt will be able to ambulate 500' over even/uneven terrain with LRAD at MOD I level    Time 6    Period Weeks    Status On-going                 Plan - 02/23/20 1155    Clinical Impression Statement Continued with knee strength and overall endurance/balance progresson today with great overall tolerance. PT will continue to progress this as able.    Personal Factors and Comorbidities Comorbidity 3+    Comorbidities PMH: afib, CAD,CHF,fibromyalgia,neck fusion, lumbar decompression sx    Examination-Activity Limitations Bend;Carry;Lift;Stand;Squat;Stairs;Sleep;Locomotion Level    Examination-Participation Restrictions Community Activity;Driving;Laundry;Yard Work;Shop;Cleaning    Stability/Clinical Decision Making Evolving/Moderate complexity    Rehab Potential Good    PT Frequency 2x / week   2-3   PT Duration 6 weeks   4-6 weeks   PT Treatment/Interventions ADLs/Self Care Home Management;Cryotherapy;Electrical Stimulation;Iontophoresis 4mg /ml Dexamethasone;Moist Heat;Ultrasound;Gait training;Stair training;Therapeutic activities;Therapeutic exercise;Balance training;Neuromuscular re-education;Manual techniques;Passive range of motion;Dry needling;Joint Manipulations;Spinal Manipulations;Vasopneumatic Device;Taping    PT Next Visit Plan review HEP, work on strengthening, balance and endurance; gait with cane/no AD if able given recent BP issues    PT Home Exercise Plan Access Code: [email or web address]    Consulted and Agree with Plan of Care Patient           Patient will benefit from skilled therapeutic intervention in order to improve the following deficits and impairments:  Abnormal gait,Decreased activity tolerance,Decreased mobility,Decreased endurance,Decreased range of motion,Decreased strength,Increased edema,Difficulty walking,Increased fascial  restricitons,Increased muscle spasms,Impaired flexibility,Pain,Postural dysfunction,Improper body mechanics  Visit Diagnosis: Difficulty in walking, not elsewhere classified  Muscle weakness (generalized)  Stiffness of right knee, not elsewhere classified  Chronic pain of right knee  Localized edema  Acute pain of right knee     Problem List Patient Active Problem List   Diagnosis Date Noted  . Abnormal urine color 01/07/2020  . AKI (acute kidney injury) ([place]) 01/07/2020  . Epigastric pain 01/07/2020  . 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 Aortic atherosclerosis ([place]) 11/03/2019  . Posterior vitreous detachment of right eye 07/15/2019  . Posterior vitreous detachment of left eye 07/15/2019  . 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 Diabetes mellitus without complication ([place]) 47/82/9562  . 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 Raynaud phenomenon 05/06/2019  . Trigger finger, left ring finger 03/18/2019  . 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 01/28/2018  . Sacral back pain 10/16/2017  . Chronic left upper quadrant pain 07/23/2017  . Rectal bleeding 07/23/2017  . Poor balance 03/14/2017  . Right shoulder pain 03/14/2017  . Lower abdominal pain 04/18/2016  . Angina pectoris ([place]) 03/22/2016  . Hypertensive heart disease 07/07/2015  . Essential hypertension 07/07/2015  . Lightheadedness 07/07/2015  . Plantar fasciitis, left 03/22/2015  . Coronary artery disease due to lipid rich plaque 01/05/2015  . Chronic diastolic CHF (congestive heart failure), NYHA class 2 ([place]) 01/05/2015  . Malignant neoplasm of ascending colon  pT1,  pN0, rM0 s/p robotic colectomy 11/11/2014 11/11/2014  . Migraine ([place]) 01/05/2014  . VBI (vertebrobasilar insufficiency) 08/22/2012  . TIA (transient ischemic attack) 06/27/2012  . DJD (degenerative joint disease) 02/02/2011  . 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 Postherpetic  neuralgia ? 01/02/2010  . Palpitations 11/23/2008  . UTI'S, RECURRENT 09/28/2008  . Fibromyalgia 08/15/2007  . 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 Hyperlipidemia 05/21/2006  . Reactive airway disease 01/29/2002    Debbe Odea, PT,DPT 02/23/2020, 11:56 AM  [facility] [address] [place], [place], [address] Phone: [phone]   Fax:  [phone]  Name: Samantha [name] MRN: [id number] Date of Birth: May 12, 1940

## 2020-02-25 ENCOUNTER — Ambulatory Visit (INDEPENDENT_AMBULATORY_CARE_PROVIDER_SITE_OTHER): Payer: Medicare Other | Admitting: Physical Therapy

## 2020-02-25 ENCOUNTER — Other Ambulatory Visit: Payer: Self-pay

## 2020-02-25 ENCOUNTER — Other Ambulatory Visit: Payer: Self-pay | Admitting: Internal Medicine

## 2020-02-25 ENCOUNTER — Encounter: Payer: Self-pay | Admitting: Physical Therapy

## 2020-02-25 DIAGNOSIS — G8929 Other chronic pain: Secondary | ICD-10-CM | POA: Diagnosis not present

## 2020-02-25 DIAGNOSIS — R262 Difficulty in walking, not elsewhere classified: Secondary | ICD-10-CM | POA: Diagnosis not present

## 2020-02-25 DIAGNOSIS — R600 Localized edema: Secondary | ICD-10-CM | POA: Diagnosis not present

## 2020-02-25 DIAGNOSIS — R2689 Other abnormalities of gait and mobility: Secondary | ICD-10-CM | POA: Diagnosis not present

## 2020-02-25 DIAGNOSIS — M25661 Stiffness of right knee, not elsewhere classified: Secondary | ICD-10-CM

## 2020-02-25 DIAGNOSIS — M6281 Muscle weakness (generalized): Secondary | ICD-10-CM

## 2020-02-25 DIAGNOSIS — M25561 Pain in right knee: Secondary | ICD-10-CM

## 2020-02-25 NOTE — Therapy (Signed)
[facility] [place], [place], [address] Phone: [phone]   Fax:  [phone]  Physical Therapy Treatment  Patient Details  Name: Samantha [name] MRN: [id number] Date of Birth: Mar 09, 1940 Referring Provider (PT): Rodell Perna   Encounter Date: 02/25/2020   PT End of Session - 02/25/20 1231    Visit Number 7    Number of Visits 16    Date for PT Re-Evaluation 03/22/20    Authorization Type Medicare/Tricare    PT Start Time 1150    PT Stop Time 1230    PT Time Calculation (min) 40 min    Activity Tolerance Patient tolerated treatment well;No increased pain;Patient limited by fatigue    Behavior During Therapy [facility] for tasks assessed/performed           Past Medical History:  Diagnosis Date  . A-fib ([place])   . Abnormal CT of the chest 2008   last CT4-l 2009:  . No f/u suggested   . Allergy   . Asthma   . CAD (coronary artery disease)    a. Coronary CTA 10/16: Coronary Ca score 211, mod non-obstructive CAD with LM mild plaque (25-50%), mid LAD 50-69%. b. Neg nuc 06/2015.  . Cataract    BILATERAL-REMOVED  . Chronic diastolic CHF (congestive heart failure) ([place])   . Collagen vascular disease ([place])    "arterial sclerosis" per pt  . Complication of anesthesia    trouble waking up  . Fibromyalgia   . GERD (gastroesophageal reflux disease)   . H/O hiatal hernia   . Heart murmur   . Hyperlipidemia   . Hypertension   . Malignant neoplasm of ascending colon ([place]) 2016   Minimally invasive right hemicolectomy to be done   . Neuromuscular disorder (HCC)    FIBROMYALGIA  . Ocular migraine   . OSA (obstructive sleep apnea) 09/2007   dx w/ a sleep study, not on  CPAP  . Osteoarthritis   . Osteoporosis   . Pneumonia    "double" in 2004  . PONV (postoperative nausea and vomiting)   . Reactive airway disease 01/29/2002   dx of pseudoasthma / vcd in 2005 and nl sprirometry History of dyspnea, 2011,  improved after several  medications were changed around Question of COPD, disproved July 06, 2009 with nl pft's      . Rheumatoid factor positive   . Shingles 11/2009  . Sleep apnea   . Stroke ([place])   . TIA (transient ischemic attack)    x2 - on Plavix for this  . Torn rotator cuff    right worse than left, both are torn  . Tumor, 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  . Type II diabetes mellitus ([place])   . 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 Vaginal dysplasia     Past Surgical History:  Procedure Laterality Date  . ABDOMINAL HYSTERECTOMY  1980   NO oophorectomy per pt   . ANTERIOR CERVICAL DECOMP/DISCECTOMY FUSION  2001   C 3, C4 and C5 plate and screws  . BREAST BIOPSY Right 1999  . BUNIONECTOMY Left ~ 1977  . CARDIAC CATHETERIZATION     2018 By Dr. Pernell Dupre (done after colon surgery)  . CATARACT EXTRACTION W/ INTRAOCULAR LENS  IMPLANT, BILATERAL  2012  . COLON SURGERY  10/2014  . EYE SURGERY Bilateral    torq lens for cataracts  . LEFT HEART CATH AND CORONARY ANGIOGRAPHY N/A 03/22/2016   Procedure: Left Heart Cath and  Coronary Angiography;  Surgeon: Belva Crome, MD;  Location: [place] [facility];  Service: Cardiovascular;  Laterality: N/A;  . LUMBAR LAMINECTOMY/DECOMPRESSION MICRODISCECTOMY N/A 01/20/2018   Procedure: L4-5 decompression;  Surgeon: Marybelle Killings, MD;  Location: [place];  Service: Orthopedics;  Laterality: N/A;  . THYROIDECTOMY, PARTIAL  1960's  . TOTAL KNEE ARTHROPLASTY Right 12/04/2019   Procedure: RIGHT TOTAL KNEE ARTHROPLASTY;  Surgeon: Marybelle Killings, MD;  Location: [place];  Service: Orthopedics;  Laterality: Right;  RNFA [place]   "Laser surgery for vaginal cancer; followed by chemotherapy" (06/27/2012)    There were no vitals filed for this visit.   Subjective Assessment - 02/25/20 1153    Subjective doing well; lightheadedness seems to be improved/resolved.  still having trouble with step up/down    Pertinent History PMH: Rt TKA 12/04/19.  afib, CAD,CHF,fibromyalgia,neck fusion, lumbar decompression sx    Limitations Walking;House hold activities;Standing;Lifting    How long can you stand comfortably? up to 30 min    Patient Stated Goals improve strength and endurance    Currently in Pain? No/denies    Pain Onset 1 to 4 weeks ago                             [facility] Adult PT Treatment/Exercise - 02/25/20 1153      Knee/Hip Exercises: Aerobic   Nustep L6 X 10  min UE/LE      Knee/Hip Exercises: Machines for Strengthening   Cybex Knee Extension 5# RLE only 3x10    Cybex Knee Flexion 15# 3x10 RLE only    Total Gym Leg Press bilat 75lbs 2x10, Rt only 37 lbs 2 sets of 10      Knee/Hip Exercises: Standing   Step Down Left;10 reps;Hand Hold: 1    Step Down Limitations off rockerboard working on Rt knee eccentric control; then off 4" step x 10 reps    Functional Squat 3 sets;10 reps    Functional Squat Limitations down ramp-partial squat                    PT Short Term Goals - 02/18/20 1651      PT SHORT TERM GOAL #1   Title Pt will be I and compliant with HEP.    Time 4    Period Weeks    Status Achieved    Target Date 01/15/20      PT SHORT TERM GOAL #2   Title Pt will improve FOTO score to 30% funciton    Time 4    Period Weeks    Status Achieved      PT SHORT TERM GOAL #3   Title -      PT SHORT TERM GOAL #4   Title -             PT Long Term Goals - 02/18/20 1652      PT LONG TERM GOAL #1   Title Pt will improve FOTO score to at least 55% function.    Time 6    Period Weeks    Status On-going      PT LONG TERM GOAL #2   Title Pt will improve Rt knee ROM 0-115 deg to improve funciton.    Time 8    Period Weeks    Status Achieved      PT LONG TERM GOAL #3   Title Pt  will improve Rt knee strength to 5/5 MMT to improve function.    Time 6    Period Weeks    Status On-going      PT LONG TERM GOAL #4   Title Pt will improve 5TSTS to less than 18 seconds    Time  6    Period Weeks    Status On-going      PT LONG TERM GOAL #5   Title Pt will be able to ambulate 500' over even/uneven terrain with LRAD at MOD I level    Time 6    Period Weeks    Status On-going                 Plan - 02/25/20 1232    Clinical Impression Statement Pt continues to make great progress with PT, and likely ready to d/c early.  She still has difficulty with eccentric quad control, so session today focused mainly on this and endurance.  Will continue to benefit from PT to maximize function.    Personal Factors and Comorbidities Comorbidity 3+    Comorbidities PMH: afib, CAD,CHF,fibromyalgia,neck fusion, lumbar decompression sx    Examination-Activity Limitations Bend;Carry;Lift;Stand;Squat;Stairs;Sleep;Locomotion Level    Examination-Participation Restrictions Community Activity;Driving;Laundry;Yard Work;Shop;Cleaning    Stability/Clinical Decision Making Evolving/Moderate complexity    Rehab Potential Good    PT Frequency 2x / week   2-3   PT Duration 6 weeks   4-6 weeks   PT Treatment/Interventions ADLs/Self Care Home Management;Cryotherapy;Electrical Stimulation;Iontophoresis 4mg /ml Dexamethasone;Moist Heat;Ultrasound;Gait training;Stair training;Therapeutic activities;Therapeutic exercise;Balance training;Neuromuscular re-education;Manual techniques;Passive range of motion;Dry needling;Joint Manipulations;Spinal Manipulations;Vasopneumatic Device;Taping    PT Next Visit Plan eccentric quad control, discuss possible d/c next 1-2 weeks    PT Home Exercise Plan Access Code: [email or web address]    Consulted and Agree with Plan of Care Patient           Patient will benefit from skilled therapeutic intervention in order to improve the following deficits and impairments:  Abnormal gait,Decreased activity tolerance,Decreased mobility,Decreased endurance,Decreased range of motion,Decreased strength,Increased edema,Difficulty walking,Increased fascial restricitons,Increased  muscle spasms,Impaired flexibility,Pain,Postural dysfunction,Improper body mechanics  Visit Diagnosis: Difficulty in walking, not elsewhere classified  Muscle weakness (generalized)  Stiffness of right knee, not elsewhere classified  Chronic pain of right knee  Localized edema  Acute pain of right knee  Other abnormalities of gait and mobility     Problem List Patient Active Problem List   Diagnosis Date Noted  . Abnormal urine color 01/07/2020  . AKI (acute kidney injury) ([place]) 01/07/2020  . Epigastric pain 01/07/2020  . 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 Aortic atherosclerosis ([place]) 11/03/2019  . Posterior vitreous detachment of right eye 07/15/2019  . Posterior vitreous detachment of left eye 07/15/2019  . 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 Diabetes mellitus without complication ([place]) [id number]  . 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 Raynaud phenomenon 05/06/2019  . Trigger finger, left ring finger 03/18/2019  . 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 01/28/2018  . Sacral back pain 10/16/2017  . Chronic left upper quadrant pain 07/23/2017  . Rectal bleeding 07/23/2017  . Poor balance 03/14/2017  . Right shoulder pain 03/14/2017  . Lower abdominal pain 04/18/2016  . Angina pectoris ([place]) 03/22/2016  . Hypertensive heart disease 07/07/2015  . Essential hypertension 07/07/2015  . Lightheadedness 07/07/2015  . Plantar fasciitis, left 03/22/2015  . Coronary artery disease due to lipid rich plaque 01/05/2015  . Chronic diastolic CHF (congestive heart failure), NYHA class 2 ([place])  01/05/2015  . 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 Migraine ([place]) 01/05/2014  . VBI (vertebrobasilar insufficiency) 08/22/2012  . TIA (transient ischemic attack) 06/27/2012  . DJD (degenerative joint disease) 02/02/2011  . 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  Postherpetic neuralgia ? 01/02/2010  . Palpitations 11/23/2008  . UTI'S, RECURRENT 09/28/2008  . Fibromyalgia 08/15/2007  . 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 Hyperlipidemia 05/21/2006  . Reactive airway disease 01/29/2002     Laureen Abrahams, PT, DPT 02/25/20 12:34 PM    [facility] [address] [place], [place], [address] Phone: [phone]   Fax:  [phone]  Name: PRESTON AUMANN MRN: [id number] Date of Birth: 12-Oct-1940

## 2020-03-01 ENCOUNTER — Ambulatory Visit (INDEPENDENT_AMBULATORY_CARE_PROVIDER_SITE_OTHER): Payer: Medicare Other | Admitting: Physical Therapy

## 2020-03-01 ENCOUNTER — Encounter: Payer: Self-pay | Admitting: Physical Therapy

## 2020-03-01 ENCOUNTER — Other Ambulatory Visit: Payer: Self-pay

## 2020-03-01 DIAGNOSIS — M6281 Muscle weakness (generalized): Secondary | ICD-10-CM

## 2020-03-01 DIAGNOSIS — M25561 Pain in right knee: Secondary | ICD-10-CM

## 2020-03-01 DIAGNOSIS — R262 Difficulty in walking, not elsewhere classified: Secondary | ICD-10-CM | POA: Diagnosis not present

## 2020-03-01 DIAGNOSIS — G8929 Other chronic pain: Secondary | ICD-10-CM | POA: Diagnosis not present

## 2020-03-01 DIAGNOSIS — R2689 Other abnormalities of gait and mobility: Secondary | ICD-10-CM

## 2020-03-01 DIAGNOSIS — M25661 Stiffness of right knee, not elsewhere classified: Secondary | ICD-10-CM

## 2020-03-01 DIAGNOSIS — R600 Localized edema: Secondary | ICD-10-CM

## 2020-03-01 NOTE — Therapy (Signed)
[place], [place], [address] Phone: [phone]   Fax:  [phone]  Physical Therapy Treatment  Patient Details  Name: Samantha [name] MRN: [id number] Date of Birth: 23-Jun-1940 Referring Provider (PT): Rodell Perna   Encounter Date: 03/01/2020   PT End of Session - 03/01/20 1159    Visit Number 8    Number of Visits 16    Date for PT Re-Evaluation 03/22/20    Authorization Type Medicare/Tricare    PT Start Time 1150    PT Stop Time 1230    PT Time Calculation (min) 40 min    Activity Tolerance Patient tolerated treatment well    Behavior During Therapy [facility] for tasks assessed/performed           Past Medical History:  Diagnosis Date  . A-fib ([place])   . Abnormal CT of the chest 2008   last CT4-l 2009:  . No f/u suggested   . Allergy   . Asthma   . CAD (coronary artery disease)    a. Coronary CTA 10/16: Coronary Ca score 211, mod non-obstructive CAD with LM mild plaque (25-50%), mid LAD 50-69%. b. Neg nuc 06/2015.  . Cataract    BILATERAL-REMOVED  . Chronic diastolic CHF (congestive heart failure) ([place])   . Collagen vascular disease ([place])    "arterial sclerosis" per pt  . Complication of anesthesia    trouble waking up  . Fibromyalgia   . GERD (gastroesophageal reflux disease)   . H/O hiatal hernia   . Heart murmur   . Hyperlipidemia   . Hypertension   . Malignant neoplasm of ascending colon ([place]) 2016   Minimally invasive right hemicolectomy to be done   . Neuromuscular disorder (HCC)    FIBROMYALGIA  . Ocular migraine   . OSA (obstructive sleep apnea) 09/2007   dx w/ a sleep study, not on  CPAP  . Osteoarthritis   . Osteoporosis   . Pneumonia    "double" in 2004  . PONV (postoperative nausea and vomiting)   . Reactive airway disease 01/29/2002   dx of pseudoasthma / vcd in 2005 and nl sprirometry History of dyspnea, 2011,  improved after several medications were changed around Question of COPD,  disproved July 06, 2009 with nl pft's      . Rheumatoid factor positive   . Shingles 11/2009  . Sleep apnea   . Stroke ([place])   . TIA (transient ischemic attack)    x2 - on Plavix for this  . Torn rotator cuff    right worse than left, both are torn  . Tumor, 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  . Type II diabetes mellitus ([place])   . 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 Vaginal dysplasia     Past Surgical History:  Procedure Laterality Date  . ABDOMINAL HYSTERECTOMY  1980   NO oophorectomy per pt   . ANTERIOR CERVICAL DECOMP/DISCECTOMY FUSION  2001   C 3, C4 and C5 plate and screws  . BREAST BIOPSY Right 1999  . BUNIONECTOMY Left ~ 1977  . CARDIAC CATHETERIZATION     2018 By Dr. Pernell Dupre (done after colon surgery)  . CATARACT EXTRACTION W/ INTRAOCULAR LENS  IMPLANT, BILATERAL  2012  . COLON SURGERY  10/2014  . EYE SURGERY Bilateral    torq lens for cataracts  . LEFT HEART CATH AND CORONARY ANGIOGRAPHY N/A 03/22/2016   Procedure: Left Heart Cath and Coronary Angiography;  Surgeon: Mallie Mussel  Nicholes Stairs, MD;  Location: [place] [facility];  Service: Cardiovascular;  Laterality: N/A;  . LUMBAR LAMINECTOMY/DECOMPRESSION MICRODISCECTOMY N/A 01/20/2018   Procedure: L4-5 decompression;  Surgeon: Marybelle Killings, MD;  Location: [facility];  Service: Orthopedics;  Laterality: N/A;  . THYROIDECTOMY, PARTIAL  1960's  . TOTAL KNEE ARTHROPLASTY Right 12/04/2019   Procedure: RIGHT TOTAL KNEE ARTHROPLASTY;  Surgeon: Marybelle Killings, MD;  Location: [place];  Service: Orthopedics;  Laterality: Right;  RNFA [place]   "Laser surgery for vaginal cancer; followed by chemotherapy" (06/27/2012)    There were no vitals filed for this visit.   Subjective Assessment - 03/01/20 1157    Subjective relays her Rt knee is doing well post op and she no longer needs cane for ambulation    Pertinent History PMH: Rt TKA 12/04/19. afib, CAD,CHF,fibromyalgia,neck fusion, lumbar decompression sx     Limitations Walking;House hold activities;Standing;Lifting    How long can you stand comfortably? up to 30 min    Patient Stated Goals improve strength and endurance    Pain Onset 1 to 4 weeks ago              [facility] PT Assessment - 03/01/20 0001      Assessment   Medical Diagnosis S/P Rt TKA 12/04/19    Referring Provider (PT) Rodell Perna    Onset Date/Surgical Date 12/04/19      AROM   Right Knee Extension 0    Right Knee Flexion 122      Strength   Right Knee Flexion 4+/5    Right Knee Extension 4+/5      Standardized Balance Assessment   Five times sit to stand comments  15.2 sec, goal was 18 sec and she had 33 sec on eval               [facility] Adult PT Treatment/Exercise - 03/01/20 0001      Knee/Hip Exercises: Stretches   Active Hamstring Stretch Right;30 seconds;3 reps    Knee: Self-Stretch Limitations Tailgate knee flexion AROM 3 minutes      Knee/Hip Exercises: Aerobic   Recumbent Bike L2 X 8 min seat 5      Knee/Hip Exercises: Machines for Strengthening   Cybex Knee Extension 5# RLE only 3x10    Cybex Knee Flexion 15# 3x10 RLE only    Total Gym Leg Press bilat 75lbs 2x10, Rt only 37 lbs 2 sets of 10      Knee/Hip Exercises: Standing   Forward Step Up Right;Hand Hold: 5;63 reps;Step Height: 6"    Step Down Left;10 reps;Hand Hold: 1;Step Height: 6"   slow eccentrics     Knee/Hip Exercises: Seated   Sit to Sand 15 reps;without UE support   slow eccentrics                   PT Short Term Goals - 02/18/20 1651      PT SHORT TERM GOAL #1   Title Pt will be I and compliant with HEP.    Time 4    Period Weeks    Status Achieved    Target Date 01/15/20      PT SHORT TERM GOAL #2   Title Pt will improve FOTO score to 30% funciton    Time 4    Period Weeks    Status Achieved      PT SHORT TERM GOAL #3   Title -  PT SHORT TERM GOAL #4   Title -             PT Long Term Goals - 03/01/20 1208      PT LONG TERM GOAL #1    Title Pt will improve FOTO score to at least 55% function.    Time 6    Period Weeks    Status On-going      PT LONG TERM GOAL #2   Title Pt will improve Rt knee ROM 0-115 deg to improve funciton.    Baseline 0-120 now    Time 8    Period Weeks    Status Achieved      PT LONG TERM GOAL #3   Title Pt will improve Rt knee strength to 5/5 MMT to improve function.    Baseline 4+ now    Time 6    Period Weeks    Status On-going      PT LONG TERM GOAL #4   Title Pt will improve 5TSTS to less than 18 seconds    Baseline 15 sec now    Time 6    Period Weeks    Status Achieved      PT LONG TERM GOAL #5   Title Pt will be able to ambulate 500' over even/uneven terrain with LRAD at MOD I level    Baseline now met    Time 6    Period Weeks    Status Achieved                 Plan - 03/01/20 1225    Clinical Impression Statement She has done great with PT. She has now met all PT goals except has mild strength deficits in Rt knee which PT will continue to progress over then next 2 weeks then she should be ready to be discharged. This was discussed with her and she is in agreeement to finish up at the end of next week.    Personal Factors and Comorbidities Comorbidity 3+    Comorbidities PMH: afib, CAD,CHF,fibromyalgia,neck fusion, lumbar decompression sx    Examination-Activity Limitations Bend;Carry;Lift;Stand;Squat;Stairs;Sleep;Locomotion Level    Examination-Participation Restrictions Community Activity;Driving;Laundry;Yard Work;Shop;Cleaning    Stability/Clinical Decision Making Evolving/Moderate complexity    Rehab Potential Good    PT Frequency 2x / week   2-3   PT Duration 6 weeks   4-6 weeks   PT Treatment/Interventions ADLs/Self Care Home Management;Cryotherapy;Electrical Stimulation;Iontophoresis 13m/ml Dexamethasone;Moist Heat;Ultrasound;Gait training;Stair training;Therapeutic activities;Therapeutic exercise;Balance training;Neuromuscular re-education;Manual  techniques;Passive range of motion;Dry needling;Joint Manipulations;Spinal Manipulations;Vasopneumatic Device;Taping    PT Next Visit Plan eccentric quad control, planned DC on 2/10    PT Home Exercise Plan Access Code: [email or web address]   Consulted and Agree with Plan of Care Patient           Patient will benefit from skilled therapeutic intervention in order to improve the following deficits and impairments:  Abnormal gait,Decreased activity tolerance,Decreased mobility,Decreased endurance,Decreased range of motion,Decreased strength,Increased edema,Difficulty walking,Increased fascial restricitons,Increased muscle spasms,Impaired flexibility,Pain,Postural dysfunction,Improper body mechanics  Visit Diagnosis: Difficulty in walking, not elsewhere classified  Muscle weakness (generalized)  Stiffness of right knee, not elsewhere classified  Chronic pain of right knee  Localized edema  Acute pain of right knee  Other abnormalities of gait and mobility     Problem List Patient Active Problem List   Diagnosis Date Noted  . Abnormal urine color 01/07/2020  . AKI (acute kidney injury) ([place] 01/07/2020  . Epigastric pain 01/07/2020  . S/P TKR (total knee replacement),  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  . Aortic atherosclerosis ([place]) 11/03/2019  . Posterior vitreous detachment of right eye 07/15/2019  . Posterior vitreous detachment of left eye 07/15/2019  . 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 Diabetes mellitus without complication ([place]) 50/15/8682  . 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 Raynaud phenomenon 05/06/2019  . Trigger finger, left ring finger 03/18/2019  . 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 01/28/2018  . Sacral back pain 10/16/2017  . Chronic left upper quadrant pain 07/23/2017  . Rectal bleeding 07/23/2017  . Poor balance 03/14/2017  . Right shoulder pain 03/14/2017  . Lower abdominal pain 04/18/2016  . Angina pectoris ([place]) 03/22/2016   . Hypertensive heart disease 07/07/2015  . Essential hypertension 07/07/2015  . Lightheadedness 07/07/2015  . Plantar fasciitis, left 03/22/2015  . Coronary artery disease due to lipid rich plaque 01/05/2015  . Chronic diastolic CHF (congestive heart failure), NYHA class 2 ([place]) 01/05/2015  . 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 Migraine ([place]) 01/05/2014  . VBI (vertebrobasilar insufficiency) 08/22/2012  . TIA (transient ischemic attack) 06/27/2012  . DJD (degenerative joint disease) 02/02/2011  . 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 Postherpetic neuralgia ? 01/02/2010  . Palpitations 11/23/2008  . UTI'S, RECURRENT 09/28/2008  . Fibromyalgia 08/15/2007  . 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 Hyperlipidemia 05/21/2006  . Reactive airway disease 01/29/2002    Debbe Odea, PT,DPT 03/01/2020, 12:29 PM  [facility] [address] [place], [place], [address] Phone: [phone]   Fax:  [phone]  Name: Samantha [name] MRN: [id number] Date of Birth: [birth year]

## 2020-03-03 ENCOUNTER — Other Ambulatory Visit: Payer: Self-pay

## 2020-03-03 ENCOUNTER — Ambulatory Visit (INDEPENDENT_AMBULATORY_CARE_PROVIDER_SITE_OTHER): Payer: Medicare Other | Admitting: Physical Therapy

## 2020-03-03 ENCOUNTER — Encounter: Payer: Self-pay | Admitting: Physical Therapy

## 2020-03-03 DIAGNOSIS — M25561 Pain in right knee: Secondary | ICD-10-CM

## 2020-03-03 DIAGNOSIS — G8929 Other chronic pain: Secondary | ICD-10-CM | POA: Diagnosis not present

## 2020-03-03 DIAGNOSIS — R262 Difficulty in walking, not elsewhere classified: Secondary | ICD-10-CM | POA: Diagnosis not present

## 2020-03-03 DIAGNOSIS — M6281 Muscle weakness (generalized): Secondary | ICD-10-CM

## 2020-03-03 DIAGNOSIS — R2689 Other abnormalities of gait and mobility: Secondary | ICD-10-CM | POA: Diagnosis not present

## 2020-03-03 DIAGNOSIS — R600 Localized edema: Secondary | ICD-10-CM

## 2020-03-03 DIAGNOSIS — M25661 Stiffness of right knee, not elsewhere classified: Secondary | ICD-10-CM | POA: Diagnosis not present

## 2020-03-03 NOTE — Therapy (Signed)
[place], [place], [address] Phone: [phone]   Fax:  [phone]  Physical Therapy Treatment  Patient Details  Name: Samantha [name] MRN: [id number] Date of Birth: 16-Nov-1940 Referring Provider (PT): Rodell Perna   Encounter Date: 03/03/2020   PT End of Session - 03/03/20 1224    Visit Number 9    Number of Visits 16    Date for PT Re-Evaluation 03/22/20    Authorization Type Medicare/Tricare    PT Start Time 1145    PT Stop Time 1230    PT Time Calculation (min) 45 min    Activity Tolerance Patient tolerated treatment well    Behavior During Therapy [facility] for tasks assessed/performed           Past Medical History:  Diagnosis Date  . A-fib ([place])   . Abnormal CT of the chest 2008   last CT4-l 2009:  . No f/u suggested   . Allergy   . Asthma   . CAD (coronary artery disease)    a. Coronary CTA 10/16: Coronary Ca score 211, mod non-obstructive CAD with LM mild plaque (25-50%), mid LAD 50-69%. b. Neg nuc 06/2015.  . Cataract    BILATERAL-REMOVED  . Chronic diastolic CHF (congestive heart failure) ([place])   . Collagen vascular disease ([place])    "arterial sclerosis" per pt  . Complication of anesthesia    trouble waking up  . Fibromyalgia   . GERD (gastroesophageal reflux disease)   . H/O hiatal hernia   . Heart murmur   . Hyperlipidemia   . Hypertension   . Malignant neoplasm of ascending colon ([place]) 2016   Minimally invasive right hemicolectomy to be done   . Neuromuscular disorder (HCC)    FIBROMYALGIA  . Ocular migraine   . OSA (obstructive sleep apnea) 09/2007   dx w/ a sleep study, not on  CPAP  . Osteoarthritis   . Osteoporosis   . Pneumonia    "double" in 2004  . PONV (postoperative nausea and vomiting)   . Reactive airway disease 01/29/2002   dx of pseudoasthma / vcd in 2005 and nl sprirometry History of dyspnea, 2011,  improved after several medications were changed around Question of COPD,  disproved July 06, 2009 with nl pft's      . Rheumatoid factor positive   . Shingles 11/2009  . Sleep apnea   . Stroke ([place])   . TIA (transient ischemic attack)    x2 - on Plavix for this  . Torn rotator cuff    right worse than left, both are torn  . Tumor, 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  . Type II diabetes mellitus ([place])   . 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 Vaginal dysplasia     Past Surgical History:  Procedure Laterality Date  . ABDOMINAL HYSTERECTOMY  1980   NO oophorectomy per pt   . ANTERIOR CERVICAL DECOMP/DISCECTOMY FUSION  2001   C 3, C4 and C5 plate and screws  . BREAST BIOPSY Right 1999  . BUNIONECTOMY Left ~ 1977  . CARDIAC CATHETERIZATION     2018 By Dr. Pernell Dupre (done after colon surgery)  . CATARACT EXTRACTION W/ INTRAOCULAR LENS  IMPLANT, BILATERAL  2012  . COLON SURGERY  10/2014  . EYE SURGERY Bilateral    torq lens for cataracts  . LEFT HEART CATH AND CORONARY ANGIOGRAPHY N/A 03/22/2016   Procedure: Left Heart Cath and Coronary Angiography;  Surgeon: Mallie Mussel  Nicholes Stairs, MD;  Location: [place] [facility];  Service: Cardiovascular;  Laterality: N/A;  . LUMBAR LAMINECTOMY/DECOMPRESSION MICRODISCECTOMY N/A 01/20/2018   Procedure: L4-5 decompression;  Surgeon: Marybelle Killings, MD;  Location: [place];  Service: Orthopedics;  Laterality: N/A;  . THYROIDECTOMY, PARTIAL  1960's  . TOTAL KNEE ARTHROPLASTY Right 12/04/2019   Procedure: RIGHT TOTAL KNEE ARTHROPLASTY;  Surgeon: Marybelle Killings, MD;  Location: [place];  Service: Orthopedics;  Laterality: Right;  RNFA [place]   "Laser surgery for vaginal cancer; followed by chemotherapy" (06/27/2012)    There were no vitals filed for this visit.   Subjective Assessment - 03/03/20 1205    Subjective denies pain upon arrival    Pertinent History PMH: Rt TKA 12/04/19. afib, CAD,CHF,fibromyalgia,neck fusion, lumbar decompression sx    Limitations Walking;House hold  activities;Standing;Lifting    How long can you stand comfortably? up to 30 min    Patient Stated Goals improve strength and endurance    Pain Onset 1 to 4 weeks ago            [facility] Adult PT Treatment/Exercise - 03/03/20 0001      Knee/Hip Exercises: Stretches   Active Hamstring Stretch Right;30 seconds;3 reps    Knee: Self-Stretch Limitations Tailgate knee flexion AROM 3 minutes      Knee/Hip Exercises: Aerobic   Recumbent Bike L2 X 8 min seat 5      Knee/Hip Exercises: Machines for Strengthening   Cybex Knee Extension 5# RLE only 3x10    Cybex Knee Flexion 20# 2x10 RLE only, then 15# X 10 reps    Total Gym Leg Press bilat 75lbs 2x12, Rt only 37 lbs 2 sets of 12      Knee/Hip Exercises: Standing   Forward Step Up Right;Hand Hold: 5;45 reps;Step Height: 6"    Step Down Left;10 reps;Hand Hold: 1;Step Height: 6"   eccentrics     Knee/Hip Exercises: Seated   Sit to Sand 2 sets;10 reps;without UE support   slow eccentrics                   PT Short Term Goals - 02/18/20 1651      PT SHORT TERM GOAL #1   Title Pt will be I and compliant with HEP.    Time 4    Period Weeks    Status Achieved    Target Date 01/15/20      PT SHORT TERM GOAL #2   Title Pt will improve FOTO score to 30% funciton    Time 4    Period Weeks    Status Achieved      PT SHORT TERM GOAL #3   Title -      PT SHORT TERM GOAL #4   Title -             PT Long Term Goals - 03/01/20 1208      PT LONG TERM GOAL #1   Title Pt will improve FOTO score to at least 55% function.    Time 6    Period Weeks    Status On-going      PT LONG TERM GOAL #2   Title Pt will improve Rt knee ROM 0-115 deg to improve funciton.    Baseline 0-120 now    Time 8    Period Weeks    Status Achieved      PT LONG TERM GOAL #3   Title Pt  will improve Rt knee strength to 5/5 MMT to improve function.    Baseline 4+ now    Time 6    Period Weeks    Status On-going      PT LONG TERM GOAL #4    Title Pt will improve 5TSTS to less than 18 seconds    Baseline 15 sec now    Time 6    Period Weeks    Status Achieved      PT LONG TERM GOAL #5   Title Pt will be able to ambulate 500' over even/uneven terrain with LRAD at MOD I level    Baseline now met    Time 6    Period Weeks    Status Achieved                 Plan - 03/03/20 1227    Clinical Impression Statement She was able to tolerate strength progression well without complaints. She will need 10th visit progress note next vsit and we will still plan to discharge next week as long she doesnt have any setbacks.    Personal Factors and Comorbidities Comorbidity 3+    Comorbidities PMH: afib, CAD,CHF,fibromyalgia,neck fusion, lumbar decompression sx    Examination-Activity Limitations Bend;Carry;Lift;Stand;Squat;Stairs;Sleep;Locomotion Level    Examination-Participation Restrictions Community Activity;Driving;Laundry;Yard Work;Shop;Cleaning    Stability/Clinical Decision Making Evolving/Moderate complexity    Rehab Potential Good    PT Frequency 2x / week   2-3   PT Duration 6 weeks   4-6 weeks   PT Treatment/Interventions ADLs/Self Care Home Management;Cryotherapy;Electrical Stimulation;Iontophoresis 74m/ml Dexamethasone;Moist Heat;Ultrasound;Gait training;Stair training;Therapeutic activities;Therapeutic exercise;Balance training;Neuromuscular re-education;Manual techniques;Passive range of motion;Dry needling;Joint Manipulations;Spinal Manipulations;Vasopneumatic Device;Taping    PT Next Visit Plan 10th visit progress note, eccentric quad control, planned DC on 2/10    PT Home Exercise Plan Access Code: [email or web address]   Consulted and Agree with Plan of Care Patient           Patient will benefit from skilled therapeutic intervention in order to improve the following deficits and impairments:  Abnormal gait,Decreased activity tolerance,Decreased mobility,Decreased endurance,Decreased range of motion,Decreased  strength,Increased edema,Difficulty walking,Increased fascial restricitons,Increased muscle spasms,Impaired flexibility,Pain,Postural dysfunction,Improper body mechanics  Visit Diagnosis: Difficulty in walking, not elsewhere classified  Muscle weakness (generalized)  Stiffness of right knee, not elsewhere classified  Chronic pain of right knee  Localized edema  Acute pain of right knee  Other abnormalities of gait and mobility     Problem List Patient Active Problem List   Diagnosis Date Noted  . Abnormal urine color 01/07/2020  . AKI (acute kidney injury) ([place] 01/07/2020  . Epigastric pain 01/07/2020  . 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 Aortic atherosclerosis ([place] 11/03/2019  . Posterior vitreous detachment of right eye 07/15/2019  . Posterior vitreous detachment of left eye 07/15/2019  . 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 Diabetes mellitus without complication ([place] 053/97/6734 . 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 Raynaud phenomenon 05/06/2019  . Trigger finger, left ring finger 03/18/2019  . 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 01/28/2018  . Sacral back pain 10/16/2017  . Chronic left upper quadrant pain 07/23/2017  . Rectal bleeding 07/23/2017  . Poor balance 03/14/2017  . Right shoulder pain 03/14/2017  . Lower abdominal pain 04/18/2016  . Angina pectoris ([place] 03/22/2016  . Hypertensive heart disease 07/07/2015  . Essential hypertension 07/07/2015  . Lightheadedness 07/07/2015  . Plantar fasciitis, left 03/22/2015  . Coronary artery disease due to lipid rich  plaque 01/05/2015  . Chronic diastolic CHF (congestive heart failure), NYHA class 2 ([place]) 01/05/2015  . 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 Migraine (O[place]) 01/05/2014  . VBI (vertebrobasilar insufficiency) 08/22/2012  . TIA (transient ischemic attack) 06/27/2012  .  DJD (degenerative joint disease) 02/02/2011  . 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 Postherpetic neuralgia ? 01/02/2010  . Palpitations 11/23/2008  . UTI'S, RECURRENT 09/28/2008  . Fibromyalgia 08/15/2007  . 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 Hyperlipidemia 05/21/2006  . Reactive airway disease 01/29/2002    Debbe Odea, PT,DPT 03/03/2020, 12:29 PM  [facility] Physical Therapy [address] [place], [place], [address] Phone: [phone]   Fax:  [phone]  Name: DEZARAY SHIBUYA MRN: [id number] Date of Birth: Jul 06, 1940

## 2020-03-05 ENCOUNTER — Other Ambulatory Visit: Payer: Self-pay | Admitting: Cardiology

## 2020-03-05 DIAGNOSIS — I2510 Atherosclerotic heart disease of native coronary artery without angina pectoris: Secondary | ICD-10-CM

## 2020-03-05 DIAGNOSIS — E785 Hyperlipidemia, unspecified: Secondary | ICD-10-CM

## 2020-03-08 ENCOUNTER — Ambulatory Visit (INDEPENDENT_AMBULATORY_CARE_PROVIDER_SITE_OTHER): Payer: Medicare Other | Admitting: Physical Therapy

## 2020-03-08 ENCOUNTER — Other Ambulatory Visit: Payer: Self-pay

## 2020-03-08 DIAGNOSIS — M6281 Muscle weakness (generalized): Secondary | ICD-10-CM

## 2020-03-08 DIAGNOSIS — R600 Localized edema: Secondary | ICD-10-CM | POA: Diagnosis not present

## 2020-03-08 DIAGNOSIS — R262 Difficulty in walking, not elsewhere classified: Secondary | ICD-10-CM

## 2020-03-08 DIAGNOSIS — R2689 Other abnormalities of gait and mobility: Secondary | ICD-10-CM

## 2020-03-08 DIAGNOSIS — M25661 Stiffness of right knee, not elsewhere classified: Secondary | ICD-10-CM

## 2020-03-08 DIAGNOSIS — G8929 Other chronic pain: Secondary | ICD-10-CM | POA: Diagnosis not present

## 2020-03-08 DIAGNOSIS — M25561 Pain in right knee: Secondary | ICD-10-CM | POA: Diagnosis not present

## 2020-03-08 NOTE — Therapy (Signed)
[facility] [address] [place], [place], [address] Phone: [phone]   Fax:  [phone]  Physical Therapy Treatment/Progress note Progress Note reporting period 02/09/2020 to 03/08/2020  See below for objective and subjective measurements relating to patients progress with PT.   Patient Details  Name: Samantha [name] MRN: [id number] Date of Birth: December 04, 1940 Referring Provider (PT): Rodell Perna   Encounter Date: 03/08/2020   PT End of Session - 03/08/20 1231    Visit Number 10    Number of Visits 16    Date for PT Re-Evaluation 03/22/20    Authorization Type Medicare/Tricare    PT Start Time 1149    PT Stop Time 1229    PT Time Calculation (min) 40 min    Activity Tolerance Patient tolerated treatment well    Behavior During Therapy [facility] for tasks assessed/performed           Past Medical History:  Diagnosis Date  . A-fib ([place])   . Abnormal CT of the chest 2008   last CT4-l 2009:  . No f/u suggested   . Allergy   . Asthma   . CAD (coronary artery disease)    a. Coronary CTA 10/16: Coronary Ca score 211, mod non-obstructive CAD with LM mild plaque (25-50%), mid LAD 50-69%. b. Neg nuc 06/2015.  . Cataract    BILATERAL-REMOVED  . Chronic diastolic CHF (congestive heart failure) ([place])   . Collagen vascular disease ([place])    "arterial sclerosis" per pt  . Complication of anesthesia    trouble waking up  . Fibromyalgia   . GERD (gastroesophageal reflux disease)   . H/O hiatal hernia   . Heart murmur   . Hyperlipidemia   . Hypertension   . Malignant neoplasm of ascending colon ([place]) 2016   Minimally invasive right hemicolectomy to be done   . Neuromuscular disorder (HCC)    FIBROMYALGIA  . Ocular migraine   . OSA (obstructive sleep apnea) 09/2007   dx w/ a sleep study, not on  CPAP  . Osteoarthritis   . Osteoporosis   . Pneumonia    "double" in 2004  . PONV (postoperative nausea and vomiting)   . Reactive airway disease  01/29/2002   dx of pseudoasthma / vcd in 2005 and nl sprirometry History of dyspnea, 2011,  improved after several medications were changed around Question of COPD, disproved July 06, 2009 with nl pft's      . Rheumatoid factor positive   . Shingles 11/2009  . Sleep apnea   . Stroke ([place])   . TIA (transient ischemic attack)    x2 - on Plavix for this  . Torn rotator cuff    right worse than left, both are torn  . Tumor, 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  . Type II diabetes mellitus ([place])   . 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 Vaginal dysplasia     Past Surgical History:  Procedure Laterality Date  . ABDOMINAL HYSTERECTOMY  1980   NO oophorectomy per pt   . ANTERIOR CERVICAL DECOMP/DISCECTOMY FUSION  2001   C 3, C4 and C5 plate and screws  . BREAST BIOPSY Right 1999  . BUNIONECTOMY Left ~ 1977  . CARDIAC CATHETERIZATION     2018 By Dr. Pernell Dupre (done after colon surgery)  . CATARACT EXTRACTION W/ INTRAOCULAR LENS  IMPLANT, BILATERAL  2012  . COLON SURGERY  10/2014  . EYE SURGERY Bilateral    torq lens for  cataracts  . LEFT HEART CATH AND CORONARY ANGIOGRAPHY N/A 03/22/2016   Procedure: Left Heart Cath and Coronary Angiography;  Surgeon: Belva Crome, MD;  Location: [place] [facility];  Service: Cardiovascular;  Laterality: N/A;  . LUMBAR LAMINECTOMY/DECOMPRESSION MICRODISCECTOMY N/A 01/20/2018   Procedure: L4-5 decompression;  Surgeon: Marybelle Killings, MD;  Location: [place];  Service: Orthopedics;  Laterality: N/A;  . THYROIDECTOMY, PARTIAL  1960's  . TOTAL KNEE ARTHROPLASTY Right 12/04/2019   Procedure: RIGHT TOTAL KNEE ARTHROPLASTY;  Surgeon: Marybelle Killings, MD;  Location: [place];  Service: Orthopedics;  Laterality: Right;  RNFA [place]   "Laser surgery for vaginal cancer; followed by chemotherapy" (06/27/2012)    There were no vitals filed for this visit.   Subjective Assessment - 03/08/20 1230    Subjective Patient is doing well with no  reports of pain.    Pertinent History PMH: Rt TKA 12/04/19. afib, CAD,CHF,fibromyalgia,neck fusion, lumbar decompression sx    Limitations Walking;House hold activities;Standing;Lifting    How long can you stand comfortably? up to 30 min    Patient Stated Goals improve strength and endurance    Currently in Pain? No/denies    Pain Score 0-No pain    Pain Onset 1 to 4 weeks ago              [facility] PT Assessment - 03/08/20 0001      Observation/Other Assessments   Focus on Therapeutic Outcomes (FOTO)  70      Strength   Right Knee Flexion 5/5    Right Knee Extension 5/5              [facility] Adult PT Treatment/Exercise - 03/08/20 0001      Knee/Hip Exercises: Machines for Strengthening   Cybex Knee Extension 10 lbs 2x10 RLE 5 1x10    Cybex Knee Flexion 20 lbs 3x10    Total Gym Leg Press bilat 75lbs 3x10, Rt only 37 lbs 2 sets of 12      Knee/Hip Exercises: Standing   Forward Step Up Right;Hand Hold: 1;15 reps;Step Height: 6"    Step Down Left;1 set;15 reps;Step Height: 6";Hand Hold: 2   with retro step up     Knee/Hip Exercises: Seated   Sit to Sand 2 sets;10 reps;without UE support                    PT Short Term Goals - 02/18/20 1651      PT SHORT TERM GOAL #1   Title Pt will be I and compliant with HEP.    Time 4    Period Weeks    Status Achieved    Target Date 01/15/20      PT SHORT TERM GOAL #2   Title Pt will improve FOTO score to 30% funciton    Time 4    Period Weeks    Status Achieved      PT SHORT TERM GOAL #3   Title -      PT SHORT TERM GOAL #4   Title -             PT Long Term Goals - 03/08/20 1229      PT LONG TERM GOAL #1   Title Pt will improve FOTO score to at least 55% function.    Time 6    Period Weeks    Status Achieved      PT LONG TERM GOAL #2  Title Pt will improve Rt knee ROM 0-115 deg to improve funciton.    Baseline 0-120 now    Time 8    Period Weeks    Status Achieved      PT LONG TERM GOAL #3    Title Pt will improve Rt knee strength to 5/5 MMT to improve function.    Baseline 4+ now    Time 6    Period Weeks    Status Achieved      PT LONG TERM GOAL #4   Title Pt will improve 5TSTS to less than 18 seconds    Baseline 15 sec now    Time 6    Period Weeks    Status Achieved      PT LONG TERM GOAL #5   Title Pt will be able to ambulate 500' over even/uneven terrain with LRAD at MOD I level    Baseline now met    Time 6    Period Weeks    Status Achieved                 Plan - 03/08/20 1232    Clinical Impression Statement Updated FOTO score to surpass goal by 15%. Her strength has increased for improved function. Patient has met all LTGs and has no complaints to report. Update HEP for continued strengthening at home before d/c next visit.    Personal Factors and Comorbidities Comorbidity 3+    Comorbidities PMH: afib, CAD,CHF,fibromyalgia,neck fusion, lumbar decompression sx    Examination-Activity Limitations Bend;Carry;Lift;Stand;Squat;Stairs;Sleep;Locomotion Level    Examination-Participation Restrictions Community Activity;Driving;Laundry;Yard Work;Shop;Cleaning    Stability/Clinical Decision Making Evolving/Moderate complexity    Rehab Potential Good    PT Frequency 2x / week   2-3   PT Duration 6 weeks   4-6 weeks   PT Treatment/Interventions ADLs/Self Care Home Management;Cryotherapy;Electrical Stimulation;Iontophoresis 52m/ml Dexamethasone;Moist Heat;Ultrasound;Gait training;Stair training;Therapeutic activities;Therapeutic exercise;Balance training;Neuromuscular re-education;Manual techniques;Passive range of motion;Dry needling;Joint Manipulations;Spinal Manipulations;Vasopneumatic Device;Taping    PT Next Visit Plan D/c this visit, update HEP to continue eccentric strength and control    PT Home Exercise Plan Access Code: [email or web address]   Consulted and Agree with Plan of Care Patient           Patient will benefit from skilled therapeutic intervention in  order to improve the following deficits and impairments:  Abnormal gait,Decreased activity tolerance,Decreased mobility,Decreased endurance,Decreased range of motion,Decreased strength,Increased edema,Difficulty walking,Increased fascial restricitons,Increased muscle spasms,Impaired flexibility,Pain,Postural dysfunction,Improper body mechanics  Visit Diagnosis: Difficulty in walking, not elsewhere classified  Muscle weakness (generalized)  Stiffness of right knee, not elsewhere classified  Chronic pain of right knee  Localized edema  Acute pain of right knee  Other abnormalities of gait and mobility     Problem List Patient Active Problem List   Diagnosis Date Noted  . Abnormal urine color 01/07/2020  . AKI (acute kidney injury) ([place] 01/07/2020  . Epigastric pain 01/07/2020  . 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 Aortic atherosclerosis ([place] 11/03/2019  . Posterior vitreous detachment of right eye 07/15/2019  . Posterior vitreous detachment of left eye 07/15/2019  . 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 Diabetes mellitus without complication ([place] 025/05/3974 . 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 Raynaud phenomenon 05/06/2019  . Trigger finger, left ring finger 03/18/2019  . 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 01/28/2018  . Sacral back pain 10/16/2017  . Chronic left upper quadrant pain 07/23/2017  . Rectal bleeding 07/23/2017  . Poor balance  03/14/2017  . Right shoulder pain 03/14/2017  . Lower abdominal pain 04/18/2016  . Angina pectoris ([place]) 03/22/2016  . Hypertensive heart disease 07/07/2015  . Essential hypertension 07/07/2015  . Lightheadedness 07/07/2015  . Plantar fasciitis, left 03/22/2015  . Coronary artery disease due to lipid rich plaque 01/05/2015  . Chronic diastolic CHF (congestive heart failure), NYHA class 2 ([place]) 01/05/2015  . Malignant neoplasm of ascending colon  pT1, pN0, rM0 s/p  robotic colectomy 11/11/2014 11/11/2014  . Migraine (Ocular) 01/05/2014  . VBI (vertebrobasilar insufficiency) 08/22/2012  . TIA (transient ischemic attack) 06/27/2012  . DJD (degenerative joint disease) 02/02/2011  . 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 Postherpetic neuralgia ? 01/02/2010  . Palpitations 11/23/2008  . UTI'S, RECURRENT 09/28/2008  . Fibromyalgia 08/15/2007  . 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 Hyperlipidemia 05/21/2006  . Reactive airway disease 01/29/2002    Glenetta Hew, SPT 03/08/2020, 12:37 PM   During this treatment session, this physical therapist was present, participating in and directing the treatment.   This note has been reviewed and this clinician agrees with the information provided.    Elsie Ra, PT, DPT 03/08/20 1:21 PM   [facility] Physical Therapy [address] [place], [place], [address] Phone: [phone]   Fax:  [phone]  Name: JAMECIA LERMAN MRN: [id number] Date of Birth: 11/04/1940

## 2020-03-10 ENCOUNTER — Other Ambulatory Visit: Payer: Self-pay

## 2020-03-10 ENCOUNTER — Ambulatory Visit (INDEPENDENT_AMBULATORY_CARE_PROVIDER_SITE_OTHER): Payer: Medicare Other | Admitting: Physical Therapy

## 2020-03-10 ENCOUNTER — Encounter: Payer: Self-pay | Admitting: Physical Therapy

## 2020-03-10 DIAGNOSIS — G8929 Other chronic pain: Secondary | ICD-10-CM | POA: Diagnosis not present

## 2020-03-10 DIAGNOSIS — M25661 Stiffness of right knee, not elsewhere classified: Secondary | ICD-10-CM

## 2020-03-10 DIAGNOSIS — M6281 Muscle weakness (generalized): Secondary | ICD-10-CM

## 2020-03-10 DIAGNOSIS — R2689 Other abnormalities of gait and mobility: Secondary | ICD-10-CM | POA: Diagnosis not present

## 2020-03-10 DIAGNOSIS — R262 Difficulty in walking, not elsewhere classified: Secondary | ICD-10-CM | POA: Diagnosis not present

## 2020-03-10 DIAGNOSIS — R600 Localized edema: Secondary | ICD-10-CM | POA: Diagnosis not present

## 2020-03-10 DIAGNOSIS — M25561 Pain in right knee: Secondary | ICD-10-CM

## 2020-03-10 NOTE — Patient Instructions (Signed)
Access Code: [email or web address] URL: [email or web address] Date: 03/10/2020 Prepared by: Faustino Congress  Exercises Seated Hamstring Stretch - 2 x daily - 6 x weekly - 1 sets - 3 reps - 30 hold Sit to Stand with Armchair - 1-2 x daily - 6 x weekly - 2-3 sets - 10 reps Mini Squat with Counter Support - 1 x daily - 7 x weekly - 2 sets - 10 reps Forward Step Up with Counter Support - 1 x daily - 7 x weekly - 2 sets - 10 reps Lateral Step Up with Counter Support - 1 x daily - 7 x weekly - 2 sets - 10 reps Deadlift with Resistance - 1 x daily - 7 x weekly - 2 sets - 10 reps

## 2020-03-10 NOTE — Therapy (Signed)
[facility] [address] [place], [place], [address] Phone: [phone]   Fax:  [phone]  Physical Therapy Treatment/Discharge Summary  Patient Details  Name: Samantha [name] MRN: [id number] Date of Birth: Sep 19, 1940 Referring Provider (PT): Annell Greening   Encounter Date: 03/10/2020   PT End of Session - 03/10/20 1226    Visit Number 11    Number of Visits 16    Date for PT Re-Evaluation 03/22/20    Authorization Type Medicare/Tricare    PT Start Time 1155   pt arrived late; d/c visit   PT Stop Time 1221    PT Time Calculation (min) 26 min    Activity Tolerance Patient tolerated treatment well    Behavior During Therapy [facility] for tasks assessed/performed           Past Medical History:  Diagnosis Date  . A-fib (HCC)   . Abnormal CT of the chest 2008   last CT4-l 2009:  . No f/u suggested   . Allergy   . Asthma   . CAD (coronary artery disease)    a. Coronary CTA 10/16: Coronary Ca score 211, mod non-obstructive CAD with LM mild plaque (25-50%), mid LAD 50-69%. b. Neg nuc 06/2015.  . Cataract    BILATERAL-REMOVED  . Chronic diastolic CHF (congestive heart failure) (HCC)   . Collagen vascular disease (HCC)    "arterial sclerosis" per pt  . Complication of anesthesia    trouble waking up  . Fibromyalgia   . GERD (gastroesophageal reflux disease)   . H/O hiatal hernia   . Heart murmur   . Hyperlipidemia   . Hypertension   . Malignant neoplasm of ascending colon (HCC) 2016   Minimally invasive right hemicolectomy to be done   . Neuromuscular disorder (HCC)    FIBROMYALGIA  . Ocular migraine   . OSA (obstructive sleep apnea) 09/2007   dx w/ a sleep study, not on  CPAP  . Osteoarthritis   . Osteoporosis   . Pneumonia    "double" in 2004  . PONV (postoperative nausea and vomiting)   . Reactive airway disease 01/29/2002   dx of pseudoasthma / vcd in 2005 and nl sprirometry History of dyspnea, 2011,  improved after several  medications were changed around Question of COPD, disproved July 06, 2009 with nl pft's      . Rheumatoid factor positive   . Shingles 11/2009  . Sleep apnea   . Stroke (HCC)   . TIA (transient ischemic attack)    x2 - on Plavix for this  . Torn rotator cuff    right worse than left, both are torn  . Tumor, 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  . Type II diabetes mellitus (HCC)   . Vaginal cancer (HCC) 1994  . Vaginal dysplasia     Past Surgical History:  Procedure Laterality Date  . ABDOMINAL HYSTERECTOMY  1980   NO oophorectomy per pt   . ANTERIOR CERVICAL DECOMP/DISCECTOMY FUSION  2001   C 3, C4 and C5 plate and screws  . BREAST BIOPSY Right 1999  . BUNIONECTOMY Left ~ 1977  . CARDIAC CATHETERIZATION     2018 By Dr. Garnette Scheuermann (done after colon surgery)  . CATARACT EXTRACTION W/ INTRAOCULAR LENS  IMPLANT, BILATERAL  2012  . COLON SURGERY  10/2014  . EYE SURGERY Bilateral    torq lens for cataracts  . LEFT HEART CATH AND CORONARY ANGIOGRAPHY N/A 03/22/2016   Procedure: Left Heart  Cath and Coronary Angiography;  Surgeon: Belva Crome, MD;  Location: [place] [facility];  Service: Cardiovascular;  Laterality: N/A;  . LUMBAR LAMINECTOMY/DECOMPRESSION MICRODISCECTOMY N/A 01/20/2018   Procedure: L4-5 decompression;  Surgeon: Marybelle Killings, MD;  Location: [place];  Service: Orthopedics;  Laterality: N/A;  . THYROIDECTOMY, PARTIAL  1960's  . TOTAL KNEE ARTHROPLASTY Right 12/04/2019   Procedure: RIGHT TOTAL KNEE ARTHROPLASTY;  Surgeon: Marybelle Killings, MD;  Location: [place];  Service: Orthopedics;  Laterality: Right;  RNFA [place]   "Laser surgery for vaginal cancer; followed by chemotherapy" (06/27/2012)    There were no vitals filed for this visit.   Subjective Assessment - 03/10/20 1155    Subjective ready to d/c today    Pertinent History PMH: Rt TKA 12/04/19. afib, CAD,CHF,fibromyalgia,neck fusion, lumbar decompression sx    Limitations  Walking;House hold activities;Standing;Lifting    How long can you stand comfortably? up to 30 min    Patient Stated Goals improve strength and endurance    Currently in Pain? No/denies                             [facility] Adult PT Treatment/Exercise - 03/10/20 1156      Knee/Hip Exercises: Aerobic   Nustep L5 x 5 min; LEs only      Knee/Hip Exercises: Standing   Lateral Step Up Both;2 sets;10 reps;Hand Hold: 1;Step Height: 6"    Forward Step Up Both;10 reps;Hand Hold: 1;Step Height: 6";2 sets    Functional Squat 2 sets;10 reps      Knee/Hip Exercises: Seated   Other Seated Knee/Hip Exercises L4 deadlifts 2x10    Sit to Sand 2 sets;10 reps;without UE support                  PT Education - 03/10/20 1225    Education Details updated HEP for strengthening    Methods Explanation    Comprehension Verbalized understanding            PT Short Term Goals - 02/18/20 1651      PT SHORT TERM GOAL #1   Title Pt will be I and compliant with HEP.    Time 4    Period Weeks    Status Achieved    Target Date 01/15/20      PT SHORT TERM GOAL #2   Title Pt will improve FOTO score to 30% funciton    Time 4    Period Weeks    Status Achieved      PT SHORT TERM GOAL #3   Title -      PT SHORT TERM GOAL #4   Title -             PT Long Term Goals - 03/08/20 1229      PT LONG TERM GOAL #1   Title Pt will improve FOTO score to at least 55% function.    Time 6    Period Weeks    Status Achieved      PT LONG TERM GOAL #2   Title Pt will improve Rt knee ROM 0-115 deg to improve funciton.    Baseline 0-120 now    Time 8    Period Weeks    Status Achieved      PT LONG TERM GOAL #3   Title Pt will improve Rt knee strength to 5/5 MMT to improve  function.    Baseline 4+ now    Time 6    Period Weeks    Status Achieved      PT LONG TERM GOAL #4   Title Pt will improve 5TSTS to less than 18 seconds    Baseline 15 sec now    Time 6    Period  Weeks    Status Achieved      PT LONG TERM GOAL #5   Title Pt will be able to ambulate 500' over even/uneven terrain with LRAD at MOD I level    Baseline now met    Time 6    Period Weeks    Status Achieved                 Plan - 03/10/20 1230    Clinical Impression Statement Pt has met all goals and is ready for d/c today.  HEP finalized for strengthening focus.  Will d/c PT today.    Personal Factors and Comorbidities Comorbidity 3+    Comorbidities PMH: afib, CAD,CHF,fibromyalgia,neck fusion, lumbar decompression sx    Examination-Activity Limitations Bend;Carry;Lift;Stand;Squat;Stairs;Sleep;Locomotion Level    Examination-Participation Restrictions Community Activity;Driving;Laundry;Yard Work;Shop;Cleaning    Stability/Clinical Decision Making Evolving/Moderate complexity    Rehab Potential Good    PT Frequency 2x / week   2-3   PT Duration 6 weeks   4-6 weeks   PT Treatment/Interventions ADLs/Self Care Home Management;Cryotherapy;Electrical Stimulation;Iontophoresis 4mg /ml Dexamethasone;Moist Heat;Ultrasound;Gait training;Stair training;Therapeutic activities;Therapeutic exercise;Balance training;Neuromuscular re-education;Manual techniques;Passive range of motion;Dry needling;Joint Manipulations;Spinal Manipulations;Vasopneumatic Device;Taping    PT Next Visit Plan d/c PT today    PT Home Exercise Plan Access Code: [email or web address]    Consulted and Agree with Plan of Care Patient           Patient will benefit from skilled therapeutic intervention in order to improve the following deficits and impairments:  Abnormal gait,Decreased activity tolerance,Decreased mobility,Decreased endurance,Decreased range of motion,Decreased strength,Increased edema,Difficulty walking,Increased fascial restricitons,Increased muscle spasms,Impaired flexibility,Pain,Postural dysfunction,Improper body mechanics  Visit Diagnosis: Difficulty in walking, not elsewhere classified  Muscle weakness  (generalized)  Stiffness of right knee, not elsewhere classified  Chronic pain of right knee  Localized edema  Acute pain of right knee  Other abnormalities of gait and mobility     Problem List Patient Active Problem List   Diagnosis Date Noted  . Abnormal urine color 01/07/2020  . AKI (acute kidney injury) ([place]) 01/07/2020  . Epigastric pain 01/07/2020  . 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 Aortic atherosclerosis ([place]) 11/03/2019  . Posterior vitreous detachment of right eye 07/15/2019  . Posterior vitreous detachment of left eye 07/15/2019  . 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 Diabetes mellitus without complication ([place]) 27/25/3664  . 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 Raynaud phenomenon 05/06/2019  . Trigger finger, left ring finger 03/18/2019  . 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 01/28/2018  . Sacral back pain 10/16/2017  . Chronic left upper quadrant pain 07/23/2017  . Rectal bleeding 07/23/2017  . Poor balance 03/14/2017  . Right shoulder pain 03/14/2017  . Lower abdominal pain 04/18/2016  . Angina pectoris ([place]) 03/22/2016  . Hypertensive heart disease 07/07/2015  . Essential hypertension 07/07/2015  . Lightheadedness 07/07/2015  . Plantar fasciitis, left 03/22/2015  . Coronary artery disease due to lipid rich plaque 01/05/2015  . Chronic diastolic CHF (congestive heart failure), NYHA class 2 ([place]) 01/05/2015  . 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  Migraine (Ocular) 01/05/2014  . VBI (vertebrobasilar insufficiency) 08/22/2012  . TIA (transient ischemic attack) 06/27/2012  . DJD (degenerative joint disease) 02/02/2011  . 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 Postherpetic neuralgia ? 01/02/2010  . Palpitations 11/23/2008  . UTI'S, RECURRENT 09/28/2008  . Fibromyalgia 08/15/2007  . DM II (diabetes mellitus, type II), w/  neuropathy 05/21/2006  . Hyperlipidemia 05/21/2006  . Reactive airway disease 01/29/2002      Laureen Abrahams, PT, DPT 03/10/20 12:32 PM    [facility] [address] [place], [place], [address] Phone: [phone]   Fax:  [phone]  Name: PIEPER KASIK MRN: [id number] Date of Birth: [birth year]      PHYSICAL THERAPY DISCHARGE SUMMARY  Visits from Start of Care: 11  Current functional level related to goals / functional outcomes: See above   Remaining deficits: See above   Education / Equipment: HEP  Plan: Patient agrees to discharge.  Patient goals were met. Patient is being discharged due to meeting the stated rehab goals.  ?????    Laureen Abrahams, PT, DPT 03/10/20 12:32 PM  [facility] [address] [place], [place], [address] Phone: [phone]   Fax:  [phone]

## 2020-03-11 ENCOUNTER — Telehealth: Payer: Self-pay | Admitting: *Deleted

## 2020-03-11 NOTE — Telephone Encounter (Signed)
Ortho bundle 90 day call completed. 

## 2020-03-25 ENCOUNTER — Telehealth: Payer: Self-pay | Admitting: Internal Medicine

## 2020-03-25 NOTE — Telephone Encounter (Signed)
Patient needs the lancets and test strips for the One Touch Verio Meter  [facility] [place], [address] AT [place] Phone:  [phone]  Fax:  [phone]     Last seen- 12.17.21 Next apt- n/a

## 2020-03-29 ENCOUNTER — Telehealth: Payer: Self-pay

## 2020-03-29 DIAGNOSIS — M797 Fibromyalgia: Secondary | ICD-10-CM | POA: Diagnosis not present

## 2020-03-29 DIAGNOSIS — M359 Systemic involvement of connective tissue, unspecified: Secondary | ICD-10-CM | POA: Diagnosis not present

## 2020-03-29 DIAGNOSIS — Z79899 Other long term (current) drug therapy: Secondary | ICD-10-CM | POA: Diagnosis not present

## 2020-03-29 DIAGNOSIS — I7300 Raynaud's syndrome without gangrene: Secondary | ICD-10-CM | POA: Diagnosis not present

## 2020-03-29 DIAGNOSIS — I10 Essential (primary) hypertension: Secondary | ICD-10-CM | POA: Diagnosis not present

## 2020-03-29 DIAGNOSIS — M069 Rheumatoid arthritis, unspecified: Secondary | ICD-10-CM | POA: Diagnosis not present

## 2020-03-29 DIAGNOSIS — M1990 Unspecified osteoarthritis, unspecified site: Secondary | ICD-10-CM | POA: Diagnosis not present

## 2020-03-29 DIAGNOSIS — E669 Obesity, unspecified: Secondary | ICD-10-CM | POA: Diagnosis not present

## 2020-03-29 DIAGNOSIS — E785 Hyperlipidemia, unspecified: Secondary | ICD-10-CM | POA: Diagnosis not present

## 2020-03-29 DIAGNOSIS — Z8739 Personal history of other diseases of the musculoskeletal system and connective tissue: Secondary | ICD-10-CM | POA: Diagnosis not present

## 2020-03-29 MED ORDER — ONETOUCH ULTRASOFT LANCETS MISC: 12 refills | Status: AC

## 2020-03-29 MED ORDER — GLUCOSE BLOOD VI STRP: ORAL_STRIP | 12 refills | Status: AC

## 2020-03-29 NOTE — Telephone Encounter (Signed)
Patient called and said she went to pick up her supplies from the pharmacy  and they told her there would be a charge. She said that they told her that Dr. Quay Burow would need to send over a letter that she would not be charged. She can be reached at [phone]. Please advise.

## 2020-03-29 NOTE — Telephone Encounter (Signed)
Faxed in today. 

## 2020-04-01 NOTE — Telephone Encounter (Signed)
Called pharmacy and was told they had faxed over CMN form. I have not received form as of yet. Waited on hold for over 20 mins today and still did not speak with anyone in the pharmacy.  Will try and call them on Monday for follow up.

## 2020-04-09 ENCOUNTER — Other Ambulatory Visit: Payer: Self-pay | Admitting: Internal Medicine

## 2020-04-13 DIAGNOSIS — E559 Vitamin D deficiency, unspecified: Secondary | ICD-10-CM | POA: Insufficient documentation

## 2020-04-13 NOTE — Progress Notes (Addendum)
Subjective:    Patient ID: Samantha [name], female    DOB: [birth year], 80 y.o.   MRN: [id number]  HPI The patient is here for follow up of their chronic medical problems, including neuropathy, htn, DM, hyperlipidemia, fibromyalgia, RAD  The past few days she has felt bladder pain and a sensation of bladder swelling.  She has a lot of gas and that made it feel better.    Sugars at home have been good.  BP variable at home.    She does exercises for her right knee/leg.    Medications and allergies reviewed with patient and updated if appropriate.  Patient Active Problem List   Diagnosis Date Noted  . Vitamin D deficiency 04/13/2020  . Epigastric pain 01/07/2020  . 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 Aortic atherosclerosis ([place]) 11/03/2019  . Posterior vitreous detachment of right eye 07/15/2019  . Posterior vitreous detachment of left eye 07/15/2019  . 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 Diabetes mellitus without complication ([place]) 49/17/9150  . 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 Raynaud phenomenon 05/06/2019  . Trigger finger, left ring finger 03/18/2019  . 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 01/28/2018  . Sacral back pain 10/16/2017  . Chronic left upper quadrant pain 07/23/2017  . Rectal bleeding 07/23/2017  . Poor balance 03/14/2017  . Right shoulder pain 03/14/2017  . Lower abdominal pain 04/18/2016  . Angina pectoris ([place]) 03/22/2016  . Hypertensive heart disease 07/07/2015  . Essential hypertension 07/07/2015  . Lightheadedness 07/07/2015  . Plantar fasciitis, left 03/22/2015  . Coronary artery disease due to lipid rich plaque 01/05/2015  . Chronic diastolic CHF (congestive heart failure), NYHA class 2 ([place]) 01/05/2015  . 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 Migraine (Ocular) 01/05/2014  . VBI (vertebrobasilar insufficiency)  08/22/2012  . TIA (transient ischemic attack) 06/27/2012  . DJD (degenerative joint disease) 02/02/2011  . 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 Palpitations 11/23/2008  . UTI'S, RECURRENT 09/28/2008  . Fibromyalgia 08/15/2007  . 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 Hyperlipidemia 05/21/2006  . Reactive airway disease 01/29/2002    Current Outpatient Medications on File Prior to Visit  Medication Sig Dispense Refill  . acetaminophen (TYLENOL) 500 MG tablet Take 500 mg by mouth every 6 (six) hours as needed for mild pain.     [name] albuterol (VENTOLIN HFA) 108 (90 Base) MCG/ACT inhaler Inhale 2 puffs into the lungs every 6 (six) hours as needed for wheezing or shortness of breath. 18 g 11  . budesonide-formoterol (SYMBICORT) 80-4.5 MCG/ACT inhaler Inhale 2 puffs into the lungs 2 (two) times daily. 6.9 g 11  . BYSTOLIC 10 MG tablet TAKE 1 TABLET(10 MG) BY MOUTH DAILY (Patient taking differently: Take 10 mg by mouth daily.) 90 tablet 2  . Calcium-Magnesium-Zinc (CAL-MAG-ZINC PO) Take 1 tablet by mouth daily.    . cetirizine (ZYRTEC) 10 MG tablet TAKE 1 TABLET(10 MG) BY MOUTH DAILY (Patient taking differently: Take 10 mg by mouth daily.) 90 tablet 3  . cholecalciferol (VITAMIN D) 1000 UNITS tablet Take 1,000 Units by mouth every morning.     . clopidogrel (PLAVIX) 75 MG tablet TAKE 1 TABLET(75 MG) BY MOUTH DAILY (Patient taking differently: Take 75 mg by mouth daily.) 90 tablet 1  . famotidine (PEPCID) 40 MG tablet Take 1 tablet (40 mg total) by mouth daily as needed for heartburn  or indigestion. 30 tablet 5  . glucose blood test strip Use as instructed 100 each 12  . hydroxychloroquine (PLAQUENIL) 200 MG tablet Take 400 mg by mouth daily.     . isosorbide mononitrate (IMDUR) 60 MG 24 hr tablet TAKE 1 TABLET(60 MG) BY MOUTH DAILY (Patient taking differently: Take 60 mg by mouth daily.) 90 tablet 3  . Lancets (ONETOUCH ULTRASOFT) lancets Use as instructed 100 each 12  .  Polyethyl Glycol-Propyl Glycol (SYSTANE) 0.4-0.3 % GEL ophthalmic gel Place 1 application into both eyes daily as needed (for dry eyes).     . Potassium 99 MG TABS Take 99 mg by mouth daily.    . pregabalin (LYRICA) 75 MG capsule TAKE 1 CAPSULE(75 MG) BY MOUTH AT BEDTIME 30 capsule 0  . REPATHA SURECLICK 673 MG/ML SOAJ INJECT 1 PEN UNDER THE SKIN EVERY 14 DAYS 6 mL 3  . tiZANidine (ZANAFLEX) 4 MG tablet Take 1 tablet (4 mg total) by mouth 2 (two) times daily as needed for muscle spasms. 40 tablet 0  . torsemide (DEMADEX) 20 MG tablet TAKE 1 TABLET(20 MG) BY MOUTH DAILY (Patient taking differently: Take 20 mg by mouth daily.) 90 tablet 3   No current facility-administered medications on file prior to visit.    Past Medical History:  Diagnosis Date  . A-fib ([place])   . Abnormal CT of the chest 2008   last CT4-l 2009:  . No f/u suggested   . Allergy   . Asthma   . CAD (coronary artery disease)    a. Coronary CTA 10/16: Coronary Ca score 211, mod non-obstructive CAD with LM mild plaque (25-50%), mid LAD 50-69%. b. Neg nuc 06/2015.  . Cataract    BILATERAL-REMOVED  . Chronic diastolic CHF (congestive heart failure) ([place])   . Collagen vascular disease ([place])    "arterial sclerosis" per pt  . Complication of anesthesia    trouble waking up  . Fibromyalgia   . GERD (gastroesophageal reflux disease)   . H/O hiatal hernia   . Heart murmur   . Hyperlipidemia   . Hypertension   . Malignant neoplasm of ascending colon ([place]) 2016   Minimally invasive right hemicolectomy to be done   . Neuromuscular disorder (HCC)    FIBROMYALGIA  . Ocular migraine   . OSA (obstructive sleep apnea) 09/2007   dx w/ a sleep study, not on  CPAP  . Osteoarthritis   . Osteoporosis   . Pneumonia    "double" in 2004  . PONV (postoperative nausea and vomiting)   . Reactive airway disease 01/29/2002   dx of pseudoasthma / vcd in 2005 and nl sprirometry History of dyspnea, 2011,  improved after several medications were  changed around Question of COPD, disproved July 06, 2009 with nl pft's      . Rheumatoid factor positive   . Shingles 11/2009  . Sleep apnea   . Stroke ([place])   . TIA (transient ischemic attack)    x2 - on Plavix for this  . Torn rotator cuff    right worse than left, both are torn  . Tumor, 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  . Type II diabetes mellitus ([place])   . Vaginal cancer ([place]) 1994  . Vaginal dysplasia     Past Surgical History:  Procedure Laterality Date  . ABDOMINAL HYSTERECTOMY  1980   NO oophorectomy per pt   . ANTERIOR CERVICAL DECOMP/DISCECTOMY FUSION  2001   C 3, C4 and  C5 plate and screws  . BREAST BIOPSY Right 1999  . BUNIONECTOMY Left ~ 1977  . CARDIAC CATHETERIZATION     2018 By Dr. Pernell Dupre (done after colon surgery)  . CATARACT EXTRACTION W/ INTRAOCULAR LENS  IMPLANT, BILATERAL  2012  . COLON SURGERY  10/2014  . EYE SURGERY Bilateral    torq lens for cataracts  . LEFT HEART CATH AND CORONARY ANGIOGRAPHY N/A 03/22/2016   Procedure: Left Heart Cath and Coronary Angiography;  Surgeon: Belva Crome, MD;  Location: [place] [facility];  Service: Cardiovascular;  Laterality: N/A;  . LUMBAR LAMINECTOMY/DECOMPRESSION MICRODISCECTOMY N/A 01/20/2018   Procedure: L4-5 decompression;  Surgeon: Marybelle Killings, MD;  Location: [place];  Service: Orthopedics;  Laterality: N/A;  . THYROIDECTOMY, PARTIAL  1960's  . TOTAL KNEE ARTHROPLASTY Right 12/04/2019   Procedure: RIGHT TOTAL KNEE ARTHROPLASTY;  Surgeon: Marybelle Killings, MD;  Location: [place];  Service: Orthopedics;  Laterality: Right;  RNFA [place]   "Laser surgery for vaginal cancer; followed by chemotherapy" (06/27/2012)    Social History   Socioeconomic History  . Marital status: Married    Spouse name: Ilona Sorrel  . Number of children: 2  . Years of education: masters  . Highest education level: Not on file  Occupational History  . Occupation: Retired, disable since 2000     Employer: RETIRED  Tobacco Use  . Smoking status: Former Smoker    Packs/day: 0.25    Years: 5.00    Pack years: 1.25    Types: Cigarettes    Quit date: 01/29/1998    Years since quitting: 22.2  . Smokeless tobacco: Never Used  . Tobacco comment: Quit in 2001  Vaping Use  . Vaping Use: Never used  Substance and Sexual Activity  . Alcohol use: No    Alcohol/week: 0.0 standard drinks  . Drug use: No  . Sexual activity: Never  Other Topics Concern  . Not on file  Social History Narrative   On disability since 2000--- also husband has MS   Education. College.   Right handed.   Social Determinants of Health   Financial Resource Strain: Low Risk   . Difficulty of Paying Living Expenses: Not hard at all  Food Insecurity: No Food Insecurity  . Worried About Charity fundraiser in the Last Year: Never true  . Ran Out of Food in the Last Year: Never true  Transportation Needs: No Transportation Needs  . Lack of Transportation (Medical): No  . Lack of Transportation (Non-Medical): No  Physical Activity: Sufficiently Active  . Days of Exercise per Week: 7 days  . Minutes of Exercise per Session: 30 min  Stress: No Stress Concern Present  . Feeling of Stress : Not at all  Social Connections: Not on file    Family History  Problem Relation Age of Onset  . Heart disease Father   . Heart disease Mother   . Lung cancer Mother   . Allergies Sister   . Parkinsonism Sister        possible  . Asthma Sister   . Asthma Paternal Grandmother   . Stroke Paternal Grandmother   . Heart disease Other        paternal grandparents, maternal grandparents,   . Heart disease Brother   . Emphysema Brother   . Aneurysm Brother        x3  . Kidney failure Brother   . Diabetes Brother   .  Diabetes Brother   . Stroke Brother   . Stroke Maternal Grandmother   . Breast cancer Neg Hx   . Colon cancer Neg Hx   . Heart attack Neg Hx   . Esophageal cancer Neg Hx   . Rectal cancer Neg Hx    . Stomach cancer Neg Hx     Review of Systems  Constitutional: Negative for chills and fever.  Respiratory: Positive for wheezing (chronic). Negative for cough and shortness of breath.   Cardiovascular: Negative for chest pain, palpitations and leg swelling.  Gastrointestinal: Positive for abdominal pain (discomfort in lower abdomen) and constipation.  Genitourinary: Positive for difficulty urinating and frequency. Negative for dysuria and hematuria.  Neurological: Positive for headaches (left posterior head). Negative for facial asymmetry and light-headedness.       Objective:   Vitals:   04/14/20 1120  BP: 132/72  Pulse: 65  Temp: 98.1 F (36.7 C)  SpO2: 97%   BP Readings from Last 3 Encounters:  04/14/20 132/72  02/12/20 (!) 161/83  01/15/20 120/72   Wt Readings from Last 3 Encounters:  04/14/20 197 lb (89.4 kg)  02/12/20 196 lb (88.9 kg)  01/15/20 196 lb (88.9 kg)   Body mass index is 36.03 kg/m.   Physical Exam    Constitutional: Appears well-developed and well-nourished. No distress.  HENT:  Head: Normocephalic and atraumatic.  Neck: Neck supple. No tracheal deviation present. No thyromegaly present.  No cervical lymphadenopathy Cardiovascular: Normal rate, regular rhythm and normal heart sounds.   2/6 sys murmur heard. No carotid bruit .  No edema Pulmonary/Chest: Effort normal and breath sounds normal. No respiratory distress. No has no wheezes. No rales.  Skin: Skin is warm and dry. Not diaphoretic.  Psychiatric: Normal mood and affect. Behavior is normal.      Assessment & Plan:    See Problem List for Assessment and Plan of chronic medical problems.    This visit occurred during the SARS-CoV-2 public health emergency.  Safety protocols were in place, including screening questions prior to the visit, additional usage of staff PPE, and extensive cleaning of exam room while observing appropriate contact time as indicated for disinfecting solutions.

## 2020-04-13 NOTE — Patient Instructions (Addendum)
    Blood work was ordered.      Medications changes include :   none     Please followup in 6 months  

## 2020-04-14 ENCOUNTER — Encounter: Payer: Self-pay | Admitting: Internal Medicine

## 2020-04-14 ENCOUNTER — Ambulatory Visit (INDEPENDENT_AMBULATORY_CARE_PROVIDER_SITE_OTHER): Payer: Medicare Other | Admitting: Internal Medicine

## 2020-04-14 ENCOUNTER — Other Ambulatory Visit: Payer: Self-pay

## 2020-04-14 VITALS — BP 132/72 | HR 65 | Temp 98.1°F | Ht 62.0 in | Wt 197.0 lb

## 2020-04-14 DIAGNOSIS — I10 Essential (primary) hypertension: Secondary | ICD-10-CM | POA: Diagnosis not present

## 2020-04-14 DIAGNOSIS — Z794 Long term (current) use of insulin: Secondary | ICD-10-CM | POA: Diagnosis not present

## 2020-04-14 DIAGNOSIS — E559 Vitamin D deficiency, unspecified: Secondary | ICD-10-CM

## 2020-04-14 DIAGNOSIS — E782 Mixed hyperlipidemia: Secondary | ICD-10-CM | POA: Diagnosis not present

## 2020-04-14 DIAGNOSIS — E1142 Type 2 diabetes mellitus with diabetic polyneuropathy: Secondary | ICD-10-CM

## 2020-04-14 DIAGNOSIS — R39198 Other difficulties with micturition: Secondary | ICD-10-CM | POA: Diagnosis not present

## 2020-04-14 DIAGNOSIS — M797 Fibromyalgia: Secondary | ICD-10-CM

## 2020-04-14 DIAGNOSIS — J4530 Mild persistent asthma, uncomplicated: Secondary | ICD-10-CM | POA: Diagnosis not present

## 2020-04-14 DIAGNOSIS — I2510 Atherosclerotic heart disease of native coronary artery without angina pectoris: Secondary | ICD-10-CM | POA: Diagnosis not present

## 2020-04-14 DIAGNOSIS — B0229 Other postherpetic nervous system involvement: Secondary | ICD-10-CM | POA: Diagnosis not present

## 2020-04-14 DIAGNOSIS — I700 Atherosclerosis of aorta: Secondary | ICD-10-CM | POA: Diagnosis not present

## 2020-04-14 DIAGNOSIS — I2583 Coronary atherosclerosis due to lipid rich plaque: Secondary | ICD-10-CM

## 2020-04-14 LAB — LIPID PANEL
Cholesterol: 131 mg/dL (ref 0–200)
HDL: 82.9 mg/dL (ref >39.00)
LDL Cholesterol: 30 mg/dL (ref 0–99)
NonHDL: 47.79
Total CHOL/HDL Ratio: 2
Triglycerides: 87 mg/dL (ref 0.0–149.0)
VLDL: 17.4 mg/dL (ref 0.0–40.0)

## 2020-04-14 LAB — COMPREHENSIVE METABOLIC PANEL
ALT: 11 U/L (ref 0–35)
AST: 13 U/L (ref 0–37)
Albumin: 4.3 g/dL (ref 3.5–5.2)
Alkaline Phosphatase: 56 U/L (ref 39–117)
BUN: 15 mg/dL (ref 6–23)
CO2: 28 mEq/L (ref 19–32)
Calcium: 9.3 mg/dL (ref 8.4–10.5)
Chloride: 102 mEq/L (ref 96–112)
Creatinine, Ser: 0.95 mg/dL (ref 0.40–1.20)
GFR: 57.62 mL/min — ABNORMAL LOW (ref >60.00)
Glucose, Bld: 96 mg/dL (ref 70–99)
Potassium: 3.6 mEq/L (ref 3.5–5.1)
Sodium: 138 mEq/L (ref 135–145)
Total Bilirubin: 0.7 mg/dL (ref 0.2–1.2)
Total Protein: 7.3 g/dL (ref 6.0–8.3)

## 2020-04-14 LAB — HEMOGLOBIN A1C: Hgb A1c MFr Bld: 6.5 % (ref 4.6–6.5)

## 2020-04-14 LAB — VITAMIN D 25 HYDROXY (VIT D DEFICIENCY, FRACTURES): VITD: 36.27 ng/mL (ref 30.00–100.00)

## 2020-04-14 NOTE — Telephone Encounter (Signed)
Patient has been able to check her sugars.

## 2020-04-14 NOTE — Assessment & Plan Note (Signed)
Weak stream at times Slight bladder pain - better after passing gas ? uti - urine dip looks clean, but given recurrent UTI's will send for cx She will monitor symptoms - if urine stream does not improve with increased fluids - can consider uro referral

## 2020-04-14 NOTE — Assessment & Plan Note (Addendum)
Chronic Controlled Continue symbicort bid, albuterol as needed

## 2020-04-14 NOTE — Assessment & Plan Note (Signed)
Chronic Sugars controlled at home - 106, 110, 150 on rare occasion Diet controlled a1c Eating well, exercises for knee/leg

## 2020-04-14 NOTE — Assessment & Plan Note (Signed)
Chronic continue repatha 140 mg injection every 2 weeks Check lipids, cmp

## 2020-04-14 NOTE — Assessment & Plan Note (Signed)
Chronic BP well controlled Continue bysotlic 10 mg daily, imdur 60 mg, torsemide 20 mg daily cmp

## 2020-04-14 NOTE — Assessment & Plan Note (Signed)
Chronic Controlled Continue lyrica 75 mg at bedtime, which is very helpful

## 2020-04-14 NOTE — Assessment & Plan Note (Signed)
Chronic Taking vitamin D daily Check vitamin D level  

## 2020-04-14 NOTE — Assessment & Plan Note (Signed)
Chronic Check lipid panel  Continue repatha Regular exercise and healthy diet encouraged  

## 2020-04-14 NOTE — Addendum Note (Signed)
Addended by: Jacobo Forest on: 04/14/2020 12:07 PM   Modules accepted: Orders

## 2020-04-15 ENCOUNTER — Telehealth: Payer: Self-pay | Admitting: Cardiology

## 2020-04-15 NOTE — Telephone Encounter (Signed)
[facility], is there any way to get the cost of her Bystolic down or covered through her current carrier? If not, what is a good alternative medication you would advise on this pt?   Please advise!

## 2020-04-15 NOTE — Telephone Encounter (Signed)
Epic shut down in he middle of creating note.     4 What is your Medication issue?  Pt called and stated that her ins compy is no longer going to pay for the Bystolic.  They will now cover Nebibvolol.  Pt would like to run this my Dr Meda Coffee to see if this med would work or does she need to stay on bystolic?

## 2020-04-15 NOTE — Telephone Encounter (Signed)
nevbivilol the generic of Bystolic. Odd that they had been filling brand. I called the pharmacy. They had been filling brand. I asked them to run th refill as generic. Stated it went through for ~$5. Called pt and informed her. She was appreciative of the call.

## 2020-04-15 NOTE — Telephone Encounter (Signed)
Pt c/o medication issue:  1. Name of Medication: BYSTOLIC 10 MG tablet [[id number]]    2. How are you currently taking this medication (dosage and times per day)? TAKE 1 TABLET(10 MG) BY MOUTH DAILY  3. Are you having a reaction (difficulty breathing--STAT)? Na   4. What is your medication issue? Pt called in and stated that her ins compy is no longer going to pay for this med .  They will cover Nebivolol.  Pt would like to know if   Best number [phone]

## 2020-05-11 ENCOUNTER — Other Ambulatory Visit: Payer: Self-pay | Admitting: Internal Medicine

## 2020-06-16 ENCOUNTER — Other Ambulatory Visit: Payer: Self-pay | Admitting: Internal Medicine

## 2020-06-24 ENCOUNTER — Encounter (HOSPITAL_COMMUNITY): Payer: Self-pay | Admitting: *Deleted

## 2020-06-24 ENCOUNTER — Telehealth: Payer: Self-pay | Admitting: Internal Medicine

## 2020-06-24 ENCOUNTER — Emergency Department (HOSPITAL_COMMUNITY)
Admission: EM | Admit: 2020-06-24 | Discharge: 2020-06-24 | Disposition: A | Discharge status: Home / Self Care | Payer: Medicare Other | Attending: Emergency Medicine | Admitting: Emergency Medicine

## 2020-06-24 ENCOUNTER — Emergency Department (HOSPITAL_COMMUNITY): Payer: Medicare Other

## 2020-06-24 ENCOUNTER — Other Ambulatory Visit: Payer: Self-pay

## 2020-06-24 DIAGNOSIS — R0602 Shortness of breath: Secondary | ICD-10-CM | POA: Diagnosis not present

## 2020-06-24 DIAGNOSIS — Z96651 Presence of right artificial knee joint: Secondary | ICD-10-CM | POA: Insufficient documentation

## 2020-06-24 DIAGNOSIS — I110 Hypertensive heart disease with heart failure: Secondary | ICD-10-CM | POA: Diagnosis not present

## 2020-06-24 DIAGNOSIS — E1140 Type 2 diabetes mellitus with diabetic neuropathy, unspecified: Secondary | ICD-10-CM | POA: Diagnosis not present

## 2020-06-24 DIAGNOSIS — Z8544 Personal history of malignant neoplasm of other female genital organs: Secondary | ICD-10-CM | POA: Insufficient documentation

## 2020-06-24 DIAGNOSIS — Z20822 Contact with and (suspected) exposure to covid-19: Secondary | ICD-10-CM | POA: Insufficient documentation

## 2020-06-24 DIAGNOSIS — J069 Acute upper respiratory infection, unspecified: Secondary | ICD-10-CM | POA: Diagnosis not present

## 2020-06-24 DIAGNOSIS — R21 Rash and other nonspecific skin eruption: Secondary | ICD-10-CM | POA: Diagnosis not present

## 2020-06-24 DIAGNOSIS — Z87891 Personal history of nicotine dependence: Secondary | ICD-10-CM | POA: Insufficient documentation

## 2020-06-24 DIAGNOSIS — I2510 Atherosclerotic heart disease of native coronary artery without angina pectoris: Secondary | ICD-10-CM | POA: Diagnosis not present

## 2020-06-24 DIAGNOSIS — Z91040 Latex allergy status: Secondary | ICD-10-CM | POA: Insufficient documentation

## 2020-06-24 DIAGNOSIS — R079 Chest pain, unspecified: Secondary | ICD-10-CM | POA: Diagnosis not present

## 2020-06-24 DIAGNOSIS — J9811 Atelectasis: Secondary | ICD-10-CM | POA: Diagnosis not present

## 2020-06-24 DIAGNOSIS — I5032 Chronic diastolic (congestive) heart failure: Secondary | ICD-10-CM | POA: Diagnosis not present

## 2020-06-24 DIAGNOSIS — J45909 Unspecified asthma, uncomplicated: Secondary | ICD-10-CM | POA: Diagnosis not present

## 2020-06-24 DIAGNOSIS — Z8673 Personal history of transient ischemic attack (TIA), and cerebral infarction without residual deficits: Secondary | ICD-10-CM | POA: Insufficient documentation

## 2020-06-24 DIAGNOSIS — Z7951 Long term (current) use of inhaled steroids: Secondary | ICD-10-CM | POA: Insufficient documentation

## 2020-06-24 DIAGNOSIS — Z7902 Long term (current) use of antithrombotics/antiplatelets: Secondary | ICD-10-CM | POA: Insufficient documentation

## 2020-06-24 DIAGNOSIS — R0789 Other chest pain: Secondary | ICD-10-CM | POA: Diagnosis not present

## 2020-06-24 LAB — CBC WITH DIFFERENTIAL/PLATELET
Abs Immature Granulocytes: 0 10*3/uL (ref 0.00–0.07)
Basophils Absolute: 0 10*3/uL (ref 0.0–0.1)
Basophils Relative: 1 %
Eosinophils Absolute: 0.1 10*3/uL (ref 0.0–0.5)
Eosinophils Relative: 4 %
HCT: 39.5 % (ref 36.0–46.0)
Hemoglobin: 12.9 g/dL (ref 12.0–15.0)
Immature Granulocytes: 0 %
Lymphocytes Relative: 42 %
Lymphs Abs: 1.4 10*3/uL (ref 0.7–4.0)
MCH: 27.9 pg (ref 26.0–34.0)
MCHC: 32.7 g/dL (ref 30.0–36.0)
MCV: 85.3 fL (ref 80.0–100.0)
Monocytes Absolute: 0.3 10*3/uL (ref 0.1–1.0)
Monocytes Relative: 8 %
Neutro Abs: 1.5 10*3/uL — ABNORMAL LOW (ref 1.7–7.7)
Neutrophils Relative %: 45 %
Platelets: 163 10*3/uL (ref 150–400)
RBC: 4.63 MIL/uL (ref 3.87–5.11)
RDW: 15.6 % — ABNORMAL HIGH (ref 11.5–15.5)
WBC: 3.3 10*3/uL — ABNORMAL LOW (ref 4.0–10.5)
nRBC: 0 % (ref 0.0–0.2)

## 2020-06-24 LAB — TROPONIN I (HIGH SENSITIVITY)
Troponin I (High Sensitivity): <2 ng/L (ref <18)
Troponin I (High Sensitivity): <2 ng/L (ref <18)

## 2020-06-24 LAB — COMPREHENSIVE METABOLIC PANEL
ALT: 16 U/L (ref 0–44)
AST: 19 U/L (ref 15–41)
Albumin: 4.4 g/dL (ref 3.5–5.0)
Alkaline Phosphatase: 55 U/L (ref 38–126)
Anion gap: 9 (ref 5–15)
BUN: 16 mg/dL (ref 8–23)
CO2: 25 mmol/L (ref 22–32)
Calcium: 9.4 mg/dL (ref 8.9–10.3)
Chloride: 104 mmol/L (ref 98–111)
Creatinine, Ser: 0.97 mg/dL (ref 0.44–1.00)
GFR, Estimated: 60 mL/min — ABNORMAL LOW (ref >60)
Glucose, Bld: 91 mg/dL (ref 70–99)
Potassium: 3.8 mmol/L (ref 3.5–5.1)
Sodium: 138 mmol/L (ref 135–145)
Total Bilirubin: 0.9 mg/dL (ref 0.3–1.2)
Total Protein: 7.4 g/dL (ref 6.5–8.1)

## 2020-06-24 LAB — BRAIN NATRIURETIC PEPTIDE: B Natriuretic Peptide: 38.5 pg/mL (ref 0.0–100.0)

## 2020-06-24 MED ORDER — ALBUTEROL SULFATE (2.5 MG/3ML) 0.083% IN NEBU
5.0000 mg | INHALATION_SOLUTION | Freq: Once | RESPIRATORY_TRACT | Status: AC
  Administered 2020-06-24: 5 mg via RESPIRATORY_TRACT
  Filled 2020-06-24: qty 6

## 2020-06-24 MED ORDER — ALBUTEROL SULFATE (2.5 MG/3ML) 0.083% IN NEBU: 2.5000 mg | INHALATION_SOLUTION | Freq: Four times a day (QID) | RESPIRATORY_TRACT | 0 refills | Status: DC | PRN

## 2020-06-24 MED ORDER — IPRATROPIUM BROMIDE 0.02 % IN SOLN
0.5000 mg | Freq: Once | RESPIRATORY_TRACT | Status: AC
  Administered 2020-06-24: 0.5 mg via RESPIRATORY_TRACT
  Filled 2020-06-24: qty 2.5

## 2020-06-24 NOTE — ED Notes (Signed)
I triaged this pt earlier today a and o x 4

## 2020-06-24 NOTE — Telephone Encounter (Signed)
Agree with ED  

## 2020-06-24 NOTE — ED Triage Notes (Signed)
The pt has had pain in chest and all over her body for 3 days some sob   Hx chf

## 2020-06-24 NOTE — ED Provider Notes (Signed)
Emergency Medicine Provider Triage Evaluation Note  Samantha [name] , a 80 y.o. female  was evaluated in triage.  Pt complains of SOB.  Began 3 days ago.  She has associated chest tightness.  States this is located diffusely across her chest.  She thought she was getting better yesterday however this morning woke up with worsening shortness of breath.  She feels like she has a hoarse voice.  Some mild rhinorrhea.  She feels like her shortness of breath is worse when she lies flat.  Does have history of CHF.  Has not missed any doses of her diuretic as she normally weighs 192, weighed herself the other day and she was 195.  No recent COVID exposure she is vaccinated.  Feels like she needs to call for is not able to cough anything up.  No increase in her lower extremity edema.  Chest pain is nonpleuritic in nature.  Does not radiate into left arm, back or jaw.  Review of Systems  Positive: Chest pain, shortness of breath, hoarse voice Negative: Pedal edema, hemoptysis, headache, dizziness, abdominal pain, back pain  Physical Exam  There were no vitals taken for this visit. Gen:   Awake, no distress  Resp:  Normal effort, hoarse voice MSK:   Moves extremities without difficulty Skin:  No pitting edema Other:    Medical Decision Making  Medically screening exam initiated at 5:11 PM.  Appropriate orders placed.  Samantha [name] was informed that the remainder of the evaluation will be completed by another provider, this initial triage assessment does not replace that evaluation, and the importance of remaining in the ED until their evaluation is complete.  Chest pain, shortness of breath   Quamir Willemsen A, PA-C 06/24/20 1716    Breck Coons, MD 06/24/20 [phone]

## 2020-06-24 NOTE — ED Provider Notes (Signed)
[facility] EMERGENCY DEPARTMENT Provider Note   CSN: [id number] Arrival date & time: 06/24/20  1642     History Chief Complaint  Patient presents with  . Chest Pain    Samantha [name] is a 80 y.o. female presents to the Emergency Department complaining of gradual, persistent, progressively worsening shortness of breath onset approximately 1 month ago.  She reports that 3 days ago she developed worsening shortness of breath, cough and myalgias.  She also has some mild nasal congestion.  Denies known sick contacts.  Is fully vaccinated for COVID.  She denies fevers or chills.  Reports that last night she developed some central chest pain and heaviness.  She reports its been constant for almost 24 hours.  She does have a history of CHF but reports that she has been taking her medications as directed, has no leg swelling and is very close to her dry weight.  She reports this does not feel like her usual CHF exacerbation.  She does report a history of asthma reports that her chest feels tight but that her albuterol inhaler has not helped today.  No dyspnea on exertion, no diaphoresis nausea syncope or near syncope.  No vomiting or diarrhea.  The history is provided by the patient and medical records. No language interpreter was used.    HPI: A 80 year old patient with a history of treated diabetes, hypertension, hypercholesterolemia and obesity presents for evaluation of chest pain. Initial onset of pain was more than 6 hours ago. The patient's chest pain is described as heaviness/pressure/tightness and is worse with exertion. The patient's chest pain is middle- or left-sided, is not well-localized, is not sharp and does not radiate to the arms/jaw/neck. The patient does not complain of nausea and denies diaphoresis. The patient has no history of stroke, has no history of peripheral artery disease, has not smoked in the past 90 days and has no relevant family history of coronary  artery disease (first degree relative at less than age 46).   Past Medical History:  Diagnosis Date  . A-fib ([place])   . Abnormal CT of the chest 2008   last CT4-l 2009:  . No f/u suggested   . Allergy   . Asthma   . CAD (coronary artery disease)    a. Coronary CTA 10/16: Coronary Ca score 211, mod non-obstructive CAD with LM mild plaque (25-50%), mid LAD 50-69%. b. Neg nuc 06/2015.  . Cataract    BILATERAL-REMOVED  . Chronic diastolic CHF (congestive heart failure) ([place])   . Collagen vascular disease ([place])    "arterial sclerosis" per pt  . Complication of anesthesia    trouble waking up  . Fibromyalgia   . GERD (gastroesophageal reflux disease)   . H/O hiatal hernia   . Heart murmur   . Hyperlipidemia   . Hypertension   . 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 Neuromuscular disorder ([facility])    FIBROMYALGIA  . Ocular migraine   . OSA (obstructive sleep apnea) 09/2007   dx w/ a sleep study, not on  CPAP  . Osteoarthritis   . Osteoporosis   . Pneumonia    "double" in 2004  . PONV (postoperative nausea and vomiting)   . Reactive airway disease 01/29/2002   dx of pseudoasthma / vcd in 2005 and nl sprirometry History of dyspnea, 2011,  improved after several medications were changed around Question of COPD, disproved July 06, 2009 with nl pft's      . Rheumatoid factor positive   . Shingles 11/2009  . Sleep apnea   . Stroke ([place])   . TIA (transient ischemic attack)    x2 - on Plavix for this  . Torn rotator cuff    right worse than left, both are torn  . Tumor, 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  . Type II diabetes mellitus ([place])   . 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 Vaginal dysplasia     Patient Active Problem List   Diagnosis Date Noted  . Difficulty urinating 04/14/2020  . Vitamin D deficiency 04/13/2020  . 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 Aortic atherosclerosis ([place]) 11/03/2019   . Posterior vitreous detachment of right eye 07/15/2019  . Posterior vitreous detachment of left eye 07/15/2019  . 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 Diabetes mellitus without complication ([place]) 16/10/9602  . 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 Raynaud phenomenon 05/06/2019  . Trigger finger, left ring finger 03/18/2019  . 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 01/28/2018  . Sacral back pain 10/16/2017  . Chronic left upper quadrant pain 07/23/2017  . Poor balance 03/14/2017  . Right shoulder pain 03/14/2017  . Angina pectoris ([place]) 03/22/2016  . Hypertensive heart disease 07/07/2015  . Essential hypertension 07/07/2015  . Lightheadedness 07/07/2015  . Plantar fasciitis, left 03/22/2015  . Coronary artery disease due to lipid rich plaque 01/05/2015  . Chronic diastolic CHF (congestive heart failure), NYHA class 2 ([place]) 01/05/2015  . 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 Migraine ([place]) 01/05/2014  . VBI (vertebrobasilar insufficiency) 08/22/2012  . TIA (transient ischemic attack) 06/27/2012  . DJD (degenerative joint disease) 02/02/2011  . 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 Palpitations 11/23/2008  . UTI'S, RECURRENT 09/28/2008  . Fibromyalgia 08/15/2007  . 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 Hyperlipidemia 05/21/2006  . Asthma 01/29/2002    Past Surgical History:  Procedure Laterality Date  . ABDOMINAL HYSTERECTOMY  1980   NO oophorectomy per pt   . ANTERIOR CERVICAL DECOMP/DISCECTOMY FUSION  2001   C 3, C4 and C5 plate and screws  . BREAST BIOPSY Right 1999  . BUNIONECTOMY Left ~ 1977  . CARDIAC CATHETERIZATION     2018 By Dr. Pernell Dupre (done after colon surgery)  . CATARACT EXTRACTION W/ INTRAOCULAR LENS  IMPLANT, BILATERAL  2012  . COLON SURGERY  10/2014  . EYE SURGERY Bilateral    torq lens for cataracts  . LEFT HEART  CATH AND CORONARY ANGIOGRAPHY N/A 03/22/2016   Procedure: Left Heart Cath and Coronary Angiography;  Surgeon: Belva Crome, MD;  Location: [facility];  Service: Cardiovascular;  Laterality: N/A;  . LUMBAR LAMINECTOMY/DECOMPRESSION MICRODISCECTOMY N/A 01/20/2018   Procedure: L4-5 decompression;  Surgeon: Marybelle Killings, MD;  Location: [facility];  Service: Orthopedics;  Laterality: N/A;  . THYROIDECTOMY, PARTIAL  1960's  . TOTAL KNEE ARTHROPLASTY Right 12/04/2019   Procedure: RIGHT TOTAL KNEE ARTHROPLASTY;  Surgeon: Marybelle Killings, MD;  Location: [facility];  Service: Orthopedics;  Laterality: Right;  RNFA [place]   "Laser surgery for vaginal cancer; followed by chemotherapy" (06/27/2012)     OB History    Gravida  2   Para      Term  Preterm      AB      Living        SAB      IAB      Ectopic      Multiple      Live Births              Family History  Problem Relation Age of Onset  . Heart disease Father   . Heart disease Mother   . Lung cancer Mother   . Allergies Sister   . Parkinsonism Sister        possible  . Asthma Sister   . Asthma Paternal Grandmother   . Stroke Paternal Grandmother   . Heart disease Other        paternal grandparents, maternal grandparents,   . Heart disease Brother   . Emphysema Brother   . Aneurysm Brother        x3  . Kidney failure Brother   . Diabetes Brother   . Diabetes Brother   . Stroke Brother   . Stroke Maternal Grandmother   . Breast cancer Neg Hx   . Colon cancer Neg Hx   . Heart attack Neg Hx   . Esophageal cancer Neg Hx   . Rectal cancer Neg Hx   . Stomach cancer Neg Hx     Social History   Tobacco Use  . Smoking status: Former Smoker    Packs/day: 0.25    Years: 5.00    Pack years: 1.25    Types: Cigarettes    Quit date: 01/29/1998    Years since quitting: 22.4  . Smokeless tobacco: Never Used  . Tobacco comment: Quit in 2001  Vaping Use  . Vaping Use: Never  used  Substance Use Topics  . Alcohol use: No    Alcohol/week: 0.0 standard drinks  . Drug use: No    Home Medications Prior to Admission medications   Medication Sig Start Date End Date Taking? Authorizing Provider  albuterol (PROVENTIL) (2.5 MG/3ML) 0.083% nebulizer solution Take 3 mLs (2.5 mg total) by nebulization every 6 (six) hours as needed for wheezing or shortness of breath. 06/24/20  Yes Kairyn Olmeda, Jarrett Soho, PA-C  acetaminophen (TYLENOL) 500 MG tablet Take 500 mg by mouth every 6 (six) hours as needed for mild pain.     [provider]  albuterol (VENTOLIN HFA) 108 (90 Base) MCG/ACT inhaler Inhale 2 puffs into the lungs every 6 (six) hours as needed for wheezing or shortness of breath. 11/04/19   [name], MD  budesonide-formoterol (SYMBICORT) 80-4.5 MCG/ACT inhaler Inhale 2 puffs into the lungs 2 (two) times daily. 11/04/19   Burns, Claudina Lick, MD  BYSTOLIC 10 MG tablet TAKE 1 TABLET(10 MG) BY MOUTH DAILY Patient taking differently: Take 10 mg by mouth daily. 10/09/19   [name], MD  Calcium-Magnesium-Zinc (CAL-MAG-ZINC PO) Take 1 tablet by mouth daily.    [provider]  cetirizine (ZYRTEC) 10 MG tablet TAKE 1 TABLET(10 MG) BY MOUTH DAILY Patient taking differently: Take 10 mg by mouth daily. 08/27/19   [name], MD  cholecalciferol (VITAMIN D) 1000 UNITS tablet Take 1,000 Units by mouth every morning.     [provider]  clopidogrel (PLAVIX) 75 MG tablet TAKE 1 TABLET(75 MG) BY MOUTH DAILY Patient taking differently: Take 75 mg by mouth daily. 08/27/19   [name], MD  famotidine (PEPCID) 40 MG tablet Take 1 tablet (40 mg total) by mouth daily as needed for  heartburn or indigestion. 01/15/20   [name], MD  glucose blood test strip Use as instructed 03/29/20   [name], MD  hydroxychloroquine (PLAQUENIL) 200 MG tablet Take 400 mg by mouth daily.     [provider]  isosorbide mononitrate (IMDUR) 60 MG 24 hr  tablet TAKE 1 TABLET(60 MG) BY MOUTH DAILY Patient taking differently: Take 60 mg by mouth daily. 12/18/19   [name], MD  Lancets [facility] ULTRASOFT) lancets Use as instructed 03/29/20   [name], MD  Polyethyl Glycol-Propyl Glycol (SYSTANE) 0.4-0.3 % GEL ophthalmic gel Place 1 application into both eyes daily as needed (for dry eyes).     [provider]  Potassium 99 MG TABS Take 99 mg by mouth daily.    [provider]  pregabalin (LYRICA) 75 MG capsule TAKE 1 CAPSULE(75 MG) BY MOUTH AT BEDTIME 06/17/20   Burns, Claudina Lick, MD  REPATHA SURECLICK 735 MG/ML SOAJ INJECT 1 PEN UNDER THE SKIN EVERY 14 DAYS 03/07/20   [name], MD  tiZANidine (ZANAFLEX) 4 MG tablet Take 1 tablet (4 mg total) by mouth 2 (two) times daily as needed for muscle spasms. 02/12/20   Marybelle Killings, MD  torsemide (DEMADEX) 20 MG tablet TAKE 1 TABLET(20 MG) BY MOUTH DAILY Patient taking differently: Take 20 mg by mouth daily. 09/29/19   [name], MD    Allergies    Bactrim [sulfamethoxazole-trimethoprim], Cefuroxime axetil, Oxycodone, Pravastatin, Seldane [terfenadine], Zocor [simvastatin], Tramadol, Atorvastatin, Latex, Lime flavor [flavoring agent], Metformin and related, Rosuvastatin, and Tape  Review of Systems   Review of Systems  Constitutional: Positive for fatigue. Negative for appetite change, diaphoresis, fever and unexpected weight change.  HENT: Negative for mouth sores.   Eyes: Negative for visual disturbance.  Respiratory: Positive for cough and shortness of breath. Negative for chest tightness and wheezing.   Cardiovascular: Positive for chest pain.  Gastrointestinal: Negative for abdominal pain, constipation, diarrhea, nausea and vomiting.  Endocrine: Negative for polydipsia, polyphagia and polyuria.  Genitourinary: Negative for dysuria, frequency, hematuria and urgency.  Musculoskeletal: Positive for myalgias. Negative for back pain and neck stiffness.   Skin: Negative for rash.  Allergic/Immunologic: Negative for immunocompromised state.  Neurological: Negative for syncope, light-headedness and headaches.  Hematological: Does not bruise/bleed easily.  Psychiatric/Behavioral: Negative for sleep disturbance. The patient is not nervous/anxious.     Physical Exam Updated Vital Signs BP (!) 177/85 (BP Location: Left Arm)   Pulse 63   Temp 98.1 F (36.7 C) (Oral)   Resp 15   Ht 5\' 2"  (1.575 m)   Wt 89.4 kg   SpO2 100%   BMI 36.05 kg/m   Physical Exam Vitals and nursing note reviewed.  Constitutional:      General: She is not in acute distress.    Appearance: She is not diaphoretic.  HENT:     Head: Normocephalic.  Eyes:     General: No scleral icterus.    Conjunctiva/sclera: Conjunctivae normal.  Cardiovascular:     Rate and Rhythm: Normal rate and regular rhythm.     Pulses: Normal pulses.          Radial pulses are 2+ on the right side and 2+ on the left side.  Pulmonary:     Effort: No tachypnea, accessory muscle usage, prolonged expiration, respiratory distress or retractions.     Breath sounds: No stridor. Decreased breath sounds present.     Comments: Equal chest rise. No increased work of  breathing. Abdominal:     General: There is no distension.     Palpations: Abdomen is soft.     Tenderness: There is no abdominal tenderness. There is no guarding or rebound.  Musculoskeletal:     Cervical back: Normal range of motion.     Right lower leg: No edema.     Left lower leg: No edema.     Comments: Moves all extremities equally and without difficulty.  Skin:    General: Skin is warm and dry.     Capillary Refill: Capillary refill takes less than 2 seconds.  Neurological:     Mental Status: She is alert.     GCS: GCS eye subscore is 4. GCS verbal subscore is 5. GCS motor subscore is 6.     Comments: Speech is clear and goal oriented.  Psychiatric:        Mood and Affect: Mood normal.     ED Results /  Procedures / Treatments   Labs (all labs ordered are listed, but only abnormal results are displayed) Labs Reviewed  CBC WITH DIFFERENTIAL/PLATELET - Abnormal; Notable for the following components:      Result Value   WBC 3.3 (*)    RDW 15.6 (*)    Neutro Abs 1.5 (*)    All other components within normal limits  COMPREHENSIVE METABOLIC PANEL - Abnormal; Notable for the following components:   GFR, Estimated 60 (*)    All other components within normal limits  SARS CORONAVIRUS 2 (TAT 6-24 HRS)  BRAIN NATRIURETIC PEPTIDE  TROPONIN I (HIGH SENSITIVITY)  TROPONIN I (HIGH SENSITIVITY)    EKG EKG Interpretation  Date/Time:  Friday Jun 24 2020 17:17:54 EDT Ventricular Rate:  65 PR Interval:  216 QRS Duration: 102 QT Interval:  442 QTC Calculation: 459 R Axis:   121 Text Interpretation: Sinus rhythm with 1st degree A-V block Right axis deviation Abnormal ECG Confirmed by Madalyn Rob [phone]) on 06/24/2020 10:24:18 PM   Radiology DG Chest 2 View  Result Date: 06/24/2020 CLINICAL DATA:  80 year old female with shortness of breath and chest pain. EXAM: CHEST - 2 VIEW COMPARISON:  Chest radiograph dated 12/29/2019. FINDINGS: Bibasilar atelectasis. No focal consolidation, pleural effusion, or pneumothorax. Stable cardiac silhouette. No acute osseous pathology. Degenerative changes of the spine and cervical ACDF. IMPRESSION: No active cardiopulmonary disease. Electronically Signed   By: Anner Crete M.D.   On: 06/24/2020 17:50    Procedures Procedures   Medications Ordered in ED Medications  albuterol (PROVENTIL) (2.5 MG/3ML) 0.083% nebulizer solution 5 mg (5 mg Nebulization Given 06/24/20 2135)  ipratropium (ATROVENT) nebulizer solution 0.5 mg (0.5 mg Nebulization Given 06/24/20 2135)    ED Course  I have reviewed the triage vital signs and the nursing notes.  Pertinent labs & imaging results that were available during my care of the patient were reviewed by me and  considered in my medical decision making (see chart for details).    MDM Rules/Calculators/A&P HEAR Score: 6                         Presents with complaints of central chest pain and shortness of breath.  She does have a history of CHF.  EKG today is without acute ischemia.  Lung sounds are diminished throughout but there are no rales.  No peripheral edema.  Chest x-ray is clear without evidence of pneumonia or pulmonary edema.  No pneumothorax.  Initial and repeat troponin are negative.  Highly doubt ACS given constant chest pain for the last 24 hours and negative troponins.  Given her chills, cough, congestion and body aches suspect URI, likely COVID.  We will send COVID test.  10:26 PM Given albuterol MDI nebulizer here in the emergency department.  She reports she feels much better her chest pain and shortness of breath have completely resolved.  She feels comfortable going home at this time.  COVID test is pending.  We will have her follow with PCP over the next several days.  Discussed reasons to return immediately to the emergency department.  Patient states understanding and is in agreement the plan.   Final Clinical Impression(s) / ED Diagnoses Final diagnoses:  Viral upper respiratory tract infection  Shortness of breath  Suspected COVID-19 virus infection    Rx / DC Orders ED Discharge Orders         Ordered    albuterol (PROVENTIL) (2.5 MG/3ML) 0.083% nebulizer solution  Every 6 hours PRN        06/24/20 2217    For home use only DME Nebulizer machine        06/24/20 2217           Bich Mchaney, Gwenlyn Perking 06/24/20 2227    Lucrezia Starch, MD 06/24/20 2258

## 2020-06-24 NOTE — Discharge Instructions (Addendum)
1. Medications: albuterol MDI and nebulizer as needed for shortness of breath and cough, usual home medications 2. Treatment: rest, drink plenty of fluids, begin OTC antihistamine (Zyrtec or Claritin)  3. Follow Up: Please followup with your primary doctor in 2-3 days for discussion of your diagnoses and further evaluation after today's visit; if you do not have a primary care doctor use the resource guide provided to find one; Please return to the ER for difficulty breathing, high fevers or worsening symptoms.

## 2020-06-24 NOTE — ED Notes (Signed)
NA for vitals  °

## 2020-06-24 NOTE — Telephone Encounter (Signed)
--  Caller states she is short of breath that started a few days ago and is worse today. She is using her albuterol TID and symbicort. Slight cough. No fever. She has RAD and asthma. States she has been having left upper chest pain and pain in her left arm.  -Advised to go to ED now

## 2020-06-24 NOTE — Telephone Encounter (Signed)
Patient has called saying she is experiencing SOB and ome breathing trouble, transferred to team health for further evaluation.

## 2020-06-25 LAB — SARS CORONAVIRUS 2 (TAT 6-24 HRS): SARS Coronavirus 2: NEGATIVE

## 2020-06-28 ENCOUNTER — Ambulatory Visit: Payer: Medicare Other | Admitting: Internal Medicine

## 2020-07-06 ENCOUNTER — Telehealth: Payer: Self-pay | Admitting: Internal Medicine

## 2020-07-06 NOTE — Telephone Encounter (Signed)
Spoke with patient today. 

## 2020-07-06 NOTE — Telephone Encounter (Signed)
Yes it is okay to stop it.  She should check with the dentist to make sure that is enough time off medication

## 2020-07-06 NOTE — Telephone Encounter (Signed)
Patient said that she has a dentist appointment for a fill in on 6/10. She said that she forgot to stop clopidogrel (PLAVIX) 75 MG tablet. She was wondering if it was okay for her to stop it for tomorrow and Friday. She can be reached at [phone]. Please advise

## 2020-07-11 ENCOUNTER — Encounter (INDEPENDENT_AMBULATORY_CARE_PROVIDER_SITE_OTHER): Payer: Self-pay | Admitting: Ophthalmology

## 2020-07-11 ENCOUNTER — Ambulatory Visit (INDEPENDENT_AMBULATORY_CARE_PROVIDER_SITE_OTHER): Payer: Medicare Other | Admitting: Ophthalmology

## 2020-07-11 ENCOUNTER — Other Ambulatory Visit: Payer: Self-pay

## 2020-07-11 DIAGNOSIS — H43812 Vitreous degeneration, left eye: Secondary | ICD-10-CM | POA: Diagnosis not present

## 2020-07-11 DIAGNOSIS — I2510 Atherosclerotic heart disease of native coronary artery without angina pectoris: Secondary | ICD-10-CM

## 2020-07-11 DIAGNOSIS — E119 Type 2 diabetes mellitus without complications: Secondary | ICD-10-CM

## 2020-07-11 DIAGNOSIS — I2583 Coronary atherosclerosis due to lipid rich plaque: Secondary | ICD-10-CM | POA: Diagnosis not present

## 2020-07-11 DIAGNOSIS — H43811 Vitreous degeneration, right eye: Secondary | ICD-10-CM

## 2020-07-11 DIAGNOSIS — J069 Acute upper respiratory infection, unspecified: Secondary | ICD-10-CM | POA: Insufficient documentation

## 2020-07-11 NOTE — Progress Notes (Signed)
Subjective:    Patient ID: Samantha [name], female    DOB: [birth year], 80 y.o.   MRN: [id number]  HPI The patient is here for follow up from the hospital.    Admitted 5/27 - 5/31   She went to the ED for chest pain.  She was experiencing SOB, cough and myalgias x 3 days that was getting worse. She had mild nasal congestion.  No fever, leg edema.  The night prior to going to the ED she developed chest pain and heaviness which was constant x 24 hrs.  Her chest felt tight, but albuterol did not help.  No DOE, diaphoresis, nausea.    EKG w/o acute ischemia.  CXR clear.  Troponin x 2 neg.  BNP normal,  cbc, cmp unremarkable.  ACS unlikely.  URI suspected, covid neg.  Albuterol neb improved chest pain and SOB resolved.  She thinks her fibromyalgia also was flared up  She has been using her Albuterol nebulizer at home, which has helped.  She is only using it as needed.     Medications and allergies reviewed with patient and updated if appropriate.  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   Diabetes mellitus without complication ([place]) 12/87/8676   Recurrent corneal erosion, 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   Raynaud phenomenon 05/06/2019   Trigger finger, left ring finger 03/18/2019   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 01/28/2018   Sacral back pain 10/16/2017   Chronic left upper quadrant pain 07/23/2017   Poor balance 03/14/2017   Right shoulder pain 03/14/2017   Angina pectoris ([place]) 03/22/2016   Hypertensive heart disease 07/07/2015   Essential hypertension 07/07/2015   Lightheadedness  07/07/2015   Plantar fasciitis, left 03/22/2015   Coronary artery disease due to lipid rich plaque 01/05/2015   Chronic diastolic CHF (congestive heart failure), NYHA class 2 ([place]) 01/05/2015   Malignant neoplasm of ascending colon  pT1, pN0, rM0 s/p robotic colectomy 11/11/2014 11/11/2014   Migraine (Ocular) 01/05/2014   VBI (vertebrobasilar insufficiency) 08/22/2012   TIA (transient ischemic attack) 06/27/2012   DJD (degenerative joint disease) 02/02/2011   Varicose veins of legs 06/06/2010   Palpitations 11/23/2008   UTI'S, RECURRENT 09/28/2008   Fibromyalgia 08/15/2007   DM II (diabetes mellitus, type II), w/ neuropathy 05/21/2006   Hyperlipidemia 05/21/2006   Asthma 01/29/2002    Current Outpatient Medications on File Prior to Visit  Medication Sig Dispense Refill   acetaminophen (TYLENOL) 500 MG tablet Take 500 mg by mouth every 6 (six) hours as needed for mild pain.      albuterol (PROVENTIL) (2.5 MG/3ML) 0.083% nebulizer solution Take 3 mLs (2.5 mg total) by nebulization every 6 (six) hours as needed for wheezing or shortness of breath. 150 mL 0   albuterol (VENTOLIN HFA) 108 (90 Base) MCG/ACT inhaler Inhale 2 puffs into the lungs every 6 (six) hours as needed for wheezing or shortness of breath. 18 g 11   budesonide-formoterol (SYMBICORT) 80-4.5 MCG/ACT inhaler Inhale 2 puffs into the lungs 2 (two) times daily. 6.9 g 11   BYSTOLIC 10 MG tablet TAKE 1 TABLET(10 MG) BY MOUTH DAILY (Patient taking differently:  Take 10 mg by mouth daily.) 90 tablet 2   Calcium-Magnesium-Zinc (CAL-MAG-ZINC PO) Take 1 tablet by mouth daily.     cetirizine (ZYRTEC) 10 MG tablet TAKE 1 TABLET(10 MG) BY MOUTH DAILY (Patient taking differently: Take 10 mg by mouth daily.) 90 tablet 3   cholecalciferol (VITAMIN D) 1000 UNITS tablet Take 1,000 Units by mouth every morning.      clopidogrel (PLAVIX) 75 MG tablet TAKE 1 TABLET(75 MG) BY MOUTH DAILY (Patient taking differently: Take 75 mg by mouth daily.) 90  tablet 1   famotidine (PEPCID) 40 MG tablet Take 1 tablet (40 mg total) by mouth daily as needed for heartburn or indigestion. 30 tablet 5   glucose blood test strip Use as instructed 100 each 12   hydroxychloroquine (PLAQUENIL) 200 MG tablet Take 400 mg by mouth daily.      isosorbide mononitrate (IMDUR) 60 MG 24 hr tablet TAKE 1 TABLET(60 MG) BY MOUTH DAILY (Patient taking differently: Take 60 mg by mouth daily.) 90 tablet 3   Lancets (ONETOUCH ULTRASOFT) lancets Use as instructed 100 each 12   losartan (COZAAR) 25 MG tablet Take 25 mg by mouth daily.     Polyethyl Glycol-Propyl Glycol (SYSTANE) 0.4-0.3 % GEL ophthalmic gel Place 1 application into both eyes daily as needed (for dry eyes).      Potassium 99 MG TABS Take 99 mg by mouth daily.     pregabalin (LYRICA) 75 MG capsule TAKE 1 CAPSULE(75 MG) BY MOUTH AT BEDTIME 30 capsule 0   REPATHA SURECLICK 761 MG/ML SOAJ INJECT 1 PEN UNDER THE SKIN EVERY 14 DAYS 6 mL 3   tiZANidine (ZANAFLEX) 4 MG tablet Take 1 tablet (4 mg total) by mouth 2 (two) times daily as needed for muscle spasms. 40 tablet 0   torsemide (DEMADEX) 20 MG tablet TAKE 1 TABLET(20 MG) BY MOUTH DAILY (Patient taking differently: Take 20 mg by mouth daily.) 90 tablet 3   No current facility-administered medications on file prior to visit.    Past Medical History:  Diagnosis Date   A-fib [facility])    Abnormal CT of the chest 2008   last CT4-l 2009:  . No f/u suggested    Allergy    Asthma    CAD (coronary artery disease)    a. Coronary CTA 10/16: Coronary Ca score 211, mod non-obstructive CAD with LM mild plaque (25-50%), mid LAD 50-69%. b. Neg nuc 06/2015.   Cataract    BILATERAL-REMOVED   Chronic diastolic CHF (congestive heart failure) (HCC)    Collagen vascular disease (HCC)    "arterial sclerosis" per pt   Complication of anesthesia    trouble waking up   Fibromyalgia    GERD (gastroesophageal reflux disease)    H/O hiatal hernia    Heart murmur    Hyperlipidemia     Hypertension    Malignant neoplasm of ascending colon ([place]) 2016   Minimally invasive right hemicolectomy to be done    Neuromuscular disorder ([place])    FIBROMYALGIA   Ocular migraine    OSA (obstructive sleep apnea) 09/2007   dx w/ a sleep study, not on  CPAP   Osteoarthritis    Osteoporosis    Pneumonia    "double" in 2004   PONV (postoperative nausea and vomiting)    Reactive airway disease 01/29/2002   dx of pseudoasthma / vcd in 2005 and nl sprirometry History of dyspnea, 2011,  improved after several medications were changed around Question of COPD, disproved July 06, 2009 with nl pft's       Rheumatoid factor positive    Shingles 11/2009   Sleep apnea    Stroke ([place])    TIA (transient ischemic attack)    x2 - on Plavix for this   Torn rotator cuff    right worse than left, both are torn   Tumor, 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 Pernell Dupre (done after colon surgery)   CATARACT EXTRACTION W/ INTRAOCULAR LENS  IMPLANT, BILATERAL  2012   COLON SURGERY  10/2014   EYE SURGERY Bilateral    torq lens for cataracts   LEFT HEART CATH AND CORONARY ANGIOGRAPHY N/A 03/22/2016   Procedure: Left Heart Cath and Coronary Angiography;  Surgeon: Belva Crome, MD;  Location: [facility];  Service: Cardiovascular;  Laterality: N/A;   LUMBAR LAMINECTOMY/DECOMPRESSION MICRODISCECTOMY N/A 01/20/2018   Procedure: L4-5 decompression;  Surgeon: Marybelle Killings, MD;  Location: [facility];  Service: Orthopedics;  Laterality: N/A;   THYROIDECTOMY, PARTIAL  1960's   TOTAL KNEE ARTHROPLASTY Right 12/04/2019    Procedure: RIGHT TOTAL KNEE ARTHROPLASTY;  Surgeon: Marybelle Killings, MD;  Location: [facility];  Service: Orthopedics;  Laterality: Right;  RNFA APRIL [place]   "Laser surgery for vaginal cancer; followed by chemotherapy" (06/27/2012)    Social History   Socioeconomic History   Marital status: Married    Spouse name: Ilona Sorrel   Number of children: 2   Years of education: masters   Highest education level: Not on file  Occupational History   Occupation: Retired, disable since 2000    Employer: RETIRED  Tobacco Use   Smoking status: Former    Packs/day: 0.25    Years: 5.00    Pack years: 1.25    Types: Cigarettes    Quit date: 01/29/1998    Years since quitting: 22.4   Smokeless tobacco: Never   Tobacco comments:    Quit in 2001  Vaping Use   Vaping Use: Never used  Substance and Sexual Activity   Alcohol use: No    Alcohol/week: 0.0 standard drinks   Drug use: No   Sexual activity: Never  Other Topics Concern   Not on file  Social History Narrative   On disability since 2000--- also husband has MS   Education. College.   Right handed.   Social Determinants of Health   Financial Resource Strain: Low Risk    Difficulty of Paying Living Expenses: Not hard at all  Food Insecurity: No Food Insecurity   Worried About Charity fundraiser in the Last Year: Never true   [place] in the Last Year: Never true  Transportation Needs: No Transportation Needs   Lack of Transportation (Medical): No   Lack of Transportation (Non-Medical): No  Physical Activity: Sufficiently Active   Days of Exercise per Week: 7 days   Minutes of Exercise per Session: 30 min  Stress: No Stress Concern Present  Feeling of Stress : Not at all  Social Connections: Not on file    Family History  Problem Relation Age of Onset   Heart disease Father    Heart disease Mother    Lung cancer Mother    Allergies Sister    Parkinsonism Sister        possible   Asthma  Sister    Asthma Paternal Grandmother    Stroke Paternal Grandmother    Heart disease Other        paternal grandparents, maternal grandparents,    Heart disease Brother    Emphysema Brother    Aneurysm Brother        x3   Kidney failure Brother    Diabetes Brother    Diabetes Brother    Stroke Brother    Stroke Maternal Grandmother    Breast cancer Neg Hx    Colon cancer Neg Hx    Heart attack Neg Hx    Esophageal cancer Neg Hx    Rectal cancer Neg Hx    Stomach cancer Neg Hx     Review of Systems  Constitutional:  Negative for appetite change (good), chills, fatigue and fever.  HENT:  Negative for congestion, sinus pain and sore throat.   Respiratory:  Positive for wheezing. Negative for cough and shortness of breath (occ with high humidity).   Cardiovascular:  Positive for palpitations (chronic - no change). Negative for chest pain and leg swelling.  Gastrointestinal:  Negative for abdominal pain and nausea.  Musculoskeletal:  Positive for arthralgias and back pain.  Neurological:  Negative for light-headedness and headaches.      Objective:   Vitals:   07/12/20 1313  BP: 130/74  Pulse: 75  Temp: 98.4 F (36.9 C)  SpO2: 98%   BP Readings from Last 3 Encounters:  07/12/20 130/74  06/24/20 (!) 157/95  04/14/20 132/72   Wt Readings from Last 3 Encounters:  07/12/20 199 lb (90.3 kg)  06/24/20 197 lb 1.5 oz (89.4 kg)  04/14/20 197 lb (89.4 kg)   Body mass index is 36.4 kg/m.   Physical Exam    Constitutional: Appears well-developed and well-nourished. No distress.  HENT:  Head: Normocephalic and atraumatic.  Neck: Neck supple. No tracheal deviation present. No thyromegaly present.  No cervical lymphadenopathy Cardiovascular: Normal rate, regular rhythm and normal heart sounds.   No murmur heard. No carotid bruit .  No edema Pulmonary/Chest: Effort normal and breath sounds normal. No respiratory distress. No has no wheezes. No rales.  Skin: Skin is warm  and dry. Not diaphoretic.  Psychiatric: Normal mood and affect. Behavior is normal.      Assessment & Plan:    See Problem List for Assessment and Plan of chronic medical problems.    This visit occurred during the SARS-CoV-2 public health emergency.  Safety protocols were in place, including screening questions prior to the visit, additional usage of staff PPE, and extensive cleaning of exam room while observing appropriate contact time as indicated for disinfecting solutions.

## 2020-07-11 NOTE — Assessment & Plan Note (Signed)
No detectable diabetic retinopathy 

## 2020-07-11 NOTE — Patient Instructions (Addendum)
    Medications changes include :   none    

## 2020-07-11 NOTE — Assessment & Plan Note (Signed)
The nature of posterior vitreous detachment was discussed with the patient as well as its physiology, its age prevalence, and its possible implication regarding retinal breaks and detachment.  An informational brochure was offered to the patient.  All the patient's questions were answered.  The patient was asked to return if new or different flashes or floaters develops.   Patient was instructed to contact office immediately if any new changes were noticed. I explained to the patient that vitreous inside the eye is similar to jello inside a bowl. As the jello melts it can start to pull away from the bowl, similarly the vitreous throughout our lives can begin to pull away from the retina. That process is called a posterior vitreous detachment. In some cases, the vitreous can tug hard enough on the retina to form a retinal tear. I discussed with the patient the signs and symptoms of a retinal detachment.  Do not rub the eye.   

## 2020-07-11 NOTE — Progress Notes (Signed)
07/11/2020     CHIEF COMPLAINT Patient presents for Retina Follow Up (1 year fu OU and OCT/Pt states VA OU stable since last visit. Pt denies FOL, floaters, or ocular pain OU. Karie Mainland: 5.8/LBS: 129)   HISTORY OF PRESENT ILLNESS: Samantha [name] is a 80 y.o. female who presents to the clinic today for:   HPI     Retina Follow Up           Diagnosis: Other   Laterality: both eyes   Onset: 1 year ago   Severity: mild   Duration: 1 year   Course: stable   Comments: 1 year fu OU and OCT Pt states VA OU stable since last visit. Pt denies FOL, floaters, or ocular pain OU.  A1C: 5.8 LBS: 129       Last edited by Kendra Opitz, COA on 07/11/2020 10:35 AM.      Referring physician: [name], MD [place],  [place] [address]  HISTORICAL INFORMATION:   Selected notes from the MEDICAL RECORD NUMBER    Lab Results  Component Value Date   HGBA1C 6.5 04/14/2020     CURRENT MEDICATIONS: Current Outpatient Medications (Ophthalmic Drugs)  Medication Sig   Polyethyl Glycol-Propyl Glycol (SYSTANE) 0.4-0.3 % GEL ophthalmic gel Place 1 application into both eyes daily as needed (for dry eyes).    No current facility-administered medications for this visit. (Ophthalmic Drugs)   Current Outpatient Medications (Other)  Medication Sig   acetaminophen (TYLENOL) 500 MG tablet Take 500 mg by mouth every 6 (six) hours as needed for mild pain.    albuterol (PROVENTIL) (2.5 MG/3ML) 0.083% nebulizer solution Take 3 mLs (2.5 mg total) by nebulization every 6 (six) hours as needed for wheezing or shortness of breath.   albuterol (VENTOLIN HFA) 108 (90 Base) MCG/ACT inhaler Inhale 2 puffs into the lungs every 6 (six) hours as needed for wheezing or shortness of breath.   budesonide-formoterol (SYMBICORT) 80-4.5 MCG/ACT inhaler Inhale 2 puffs into the lungs 2 (two) times daily.   BYSTOLIC 10 MG tablet TAKE 1 TABLET(10 MG) BY MOUTH DAILY (Patient taking differently: Take 10  mg by mouth daily.)   Calcium-Magnesium-Zinc (CAL-MAG-ZINC PO) Take 1 tablet by mouth daily.   cetirizine (ZYRTEC) 10 MG tablet TAKE 1 TABLET(10 MG) BY MOUTH DAILY (Patient taking differently: Take 10 mg by mouth daily.)   cholecalciferol (VITAMIN D) 1000 UNITS tablet Take 1,000 Units by mouth every morning.    clopidogrel (PLAVIX) 75 MG tablet TAKE 1 TABLET(75 MG) BY MOUTH DAILY (Patient taking differently: Take 75 mg by mouth daily.)   famotidine (PEPCID) 40 MG tablet Take 1 tablet (40 mg total) by mouth daily as needed for heartburn or indigestion.   glucose blood test strip Use as instructed   hydroxychloroquine (PLAQUENIL) 200 MG tablet Take 400 mg by mouth daily.    isosorbide mononitrate (IMDUR) 60 MG 24 hr tablet TAKE 1 TABLET(60 MG) BY MOUTH DAILY (Patient taking differently: Take 60 mg by mouth daily.)   Lancets (ONETOUCH ULTRASOFT) lancets Use as instructed   Potassium 99 MG TABS Take 99 mg by mouth daily.   pregabalin (LYRICA) 75 MG capsule TAKE 1 CAPSULE(75 MG) BY MOUTH AT BEDTIME   REPATHA SURECLICK 638 MG/ML SOAJ INJECT 1 PEN UNDER THE SKIN EVERY 14 DAYS   tiZANidine (ZANAFLEX) 4 MG tablet Take 1 tablet (4 mg total) by mouth 2 (two) times daily as needed for muscle spasms.   torsemide (DEMADEX) 20  MG tablet TAKE 1 TABLET(20 MG) BY MOUTH DAILY (Patient taking differently: Take 20 mg by mouth daily.)   No current facility-administered medications for this visit. (Other)      REVIEW OF SYSTEMS:    ALLERGIES Allergies  Allergen Reactions   Bactrim [Sulfamethoxazole-Trimethoprim] Other (See Comments)    See OV 09-15-13, rash-tongue swelling due to bactrim ?   Cefuroxime Axetil Hives   Oxycodone Nausea And Vomiting   Pravastatin Other (See Comments)    "muscle breakdown" with profuse sweating   Seldane [Terfenadine] Hives   Zocor [Simvastatin] Other (See Comments)    2012 "Muscle breakdown " with profuse sweating   Tramadol Nausea And Vomiting   Atorvastatin Other (See  Comments)    Pt reports muscle aches and joint pains   Latex     blisters   Lime Flavor [Flavoring Agent] Diarrhea   Metformin And Related Diarrhea   Rosuvastatin Other (See Comments)    Pt reports "myalgias, fatigue, nosebleeds."   Tape Other (See Comments)    blisters    PAST MEDICAL HISTORY Past Medical History:  Diagnosis Date   A-fib ([place])    Abnormal CT of the chest 2008   last CT4-l 2009:  . No f/u suggested    Allergy    Asthma    CAD (coronary artery disease)    a. Coronary CTA 10/16: Coronary Ca score 211, mod non-obstructive CAD with LM mild plaque (25-50%), mid LAD 50-69%. b. Neg nuc 06/2015.   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   Minimally invasive right hemicolectomy to be done    Neuromuscular disorder (HCC)    FIBROMYALGIA   Ocular migraine    OSA (obstructive sleep apnea) 09/2007   dx w/ a sleep study, not on  CPAP   Osteoarthritis    Osteoporosis    Pneumonia    "double" in 2004   PONV (postoperative nausea and vomiting)    Reactive airway disease 01/29/2002   dx of pseudoasthma / vcd in 2005 and nl sprirometry History of dyspnea, 2011,  improved after several medications were changed around Question of COPD, disproved July 06, 2009 with nl pft's       Rheumatoid factor positive    Shingles 11/2009   Sleep apnea    Stroke ([place])    TIA (transient ischemic attack)    x2 - on Plavix for this   Torn rotator cuff    right worse than left, both are torn   Tumor, 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 Pernell Dupre (done after colon surgery)   CATARACT  EXTRACTION W/ INTRAOCULAR LENS  IMPLANT, BILATERAL  2012   COLON SURGERY  10/2014   EYE SURGERY Bilateral    torq lens for cataracts   LEFT HEART CATH AND CORONARY ANGIOGRAPHY N/A 03/22/2016   Procedure: Left Heart Cath and Coronary Angiography;  Surgeon: Belva Crome, MD;  Location: [facility];  Service: Cardiovascular;  Laterality: N/A;   LUMBAR LAMINECTOMY/DECOMPRESSION MICRODISCECTOMY N/A 01/20/2018   Procedure: L4-5 decompression;  Surgeon: Marybelle Killings, MD;  Location: [facility];  Service: Orthopedics;  Laterality: N/A;   THYROIDECTOMY, PARTIAL  1960's   TOTAL KNEE ARTHROPLASTY Right 12/04/2019   Procedure: RIGHT TOTAL KNEE ARTHROPLASTY;  Surgeon: Marybelle Killings, MD;  Location: [facility];  Service: Orthopedics;  Laterality: Right;  RNFA APRIL PLEASE   VAGINAL MASS EXCISION  1994   "Laser surgery for vaginal cancer; followed by chemotherapy" (06/27/2012)    FAMILY HISTORY Family History  Problem Relation Age of Onset   Heart disease Father    Heart disease Mother    Lung cancer Mother    Allergies Sister    Parkinsonism Sister        possible   Asthma Sister    Asthma Paternal Grandmother    Stroke Paternal Grandmother    Heart disease Other        paternal grandparents, maternal grandparents,    Heart disease Brother    Emphysema Brother    Aneurysm Brother        x3   Kidney failure Brother    Diabetes Brother    Diabetes Brother    Stroke Brother    Stroke Maternal Grandmother    Breast cancer Neg Hx    Colon cancer Neg Hx    Heart attack Neg Hx    Esophageal cancer Neg Hx    Rectal cancer Neg Hx    Stomach cancer Neg Hx     SOCIAL  HISTORY Social History   Tobacco Use   Smoking status: Former    Packs/day: 0.25    Years: 5.00    Pack years: 1.25    Types: Cigarettes    Quit date: 01/29/1998    Years since quitting: 22.4   Smokeless tobacco: Never   Tobacco comments:    Quit in 2001  Vaping Use   Vaping Use: Never used  Substance Use Topics   Alcohol use: No    Alcohol/week: 0.0 standard drinks   Drug use: No         OPHTHALMIC EXAM:  Base Eye Exam     Visual Acuity (ETDRS)       Right Left   Dist [place] 20/25 +2 20/20 -2         Tonometry (Tonopen, 10:39 AM)       Right Left   Pressure 08 10         Pupils       Pupils Dark Light Shape React APD   Right PERRL 4 3 Round Brisk None   Left PERRL 4 3 Round Brisk None         Visual Fields (Counting fingers)       Left Right    Full Full         Extraocular Movement       Right Left    Full Full         Neuro/Psych     Oriented x3: Yes   Mood/Affect: Normal         Dilation  Both eyes: 1.0% Mydriacyl, 2.5% Phenylephrine @ 10:39 AM           Slit Lamp and Fundus Exam     External Exam       Right Left   External Normal Normal         Slit Lamp Exam       Right Left   Lids/Lashes Normal Normal   Conjunctiva/Sclera White and quiet White and quiet   Cornea Clear Clear   Anterior Chamber Deep and quiet Deep and quiet   Iris Round and reactive Round and reactive   Lens Centered posterior chamber intraocular lens Centered posterior chamber intraocular lens   Anterior Vitreous Normal Normal         Fundus Exam       Right Left   Posterior Vitreous Posterior vitreous detachment Posterior vitreous detachment   Disc Normal Normal   C/D Ratio 0.25 0.25   Macula Normal Normal   Vessels no DR no DR   Periphery Normal Normal            IMAGING AND PROCEDURES  Imaging and Procedures for 07/11/20  OCT, Retina - OU - Both Eyes       Right Eye Quality was good. Scan locations included  subfoveal. Central Foveal Thickness: 224. Progression has been stable. Findings include normal observations.   Left Eye Quality was good. Scan locations included subfoveal. Central Foveal Thickness: 222. Progression has been stable. Findings include normal observations.   Notes Posterior vitreous detachment OU normal fovea OU             ASSESSMENT/PLAN:  Diabetes mellitus without complication (HCC) No detectable diabetic retinopathy  Posterior vitreous detachment of left eye  The nature of posterior vitreous detachment was discussed with the patient as well as its physiology, its age prevalence, and its possible implication regarding retinal breaks and detachment.  An informational brochure was offered to the patient.  All the patient's questions were answered.  The patient was asked to return if new or different flashes or floaters develops.   Patient was instructed to contact office immediately if any new changes were noticed. I explained to the patient that vitreous inside the eye is similar to jello inside a bowl. As the jello melts it can start to pull away from the bowl, similarly the vitreous throughout our lives can begin to pull away from the retina. That process is called a posterior vitreous detachment. In some cases, the vitreous can tug hard enough on the retina to form a retinal tear. I discussed with the patient the signs and symptoms of a retinal detachment.  Do not rub the eye.       ICD-10-CM   1. Posterior vitreous detachment of right eye  H43.811     2. Posterior vitreous detachment of left eye  H43.812 OCT, Retina - OU - Both Eyes    3. Diabetes mellitus without complication ([place])  M22.6       1.  No detectable diabetic retinopathy OU  2.  No interval change OU  3.  Follow-up Dr. Billey Gosling as scheduled  Ophthalmic Meds Ordered this visit:  No orders of the defined types were placed in this encounter.      Return in about 1 year (around 07/11/2021)  for DILATE OU, OCT.  There are no Patient Instructions on file for this visit.   Explained the diagnoses, plan, and follow up with the patient and they expressed understanding.  Patient expressed  understanding of the importance of proper follow up care.   Clent Demark Amro Winebarger M.D. Diseases & Surgery of the Retina and Vitreous Retina & Diabetic [place] 07/11/20     Abbreviations: M myopia (nearsighted); A astigmatism; H hyperopia (farsighted); P presbyopia; Mrx spectacle prescription;  CTL contact lenses; OD right eye; OS left eye; OU both eyes  XT exotropia; ET esotropia; PEK punctate epithelial keratitis; PEE punctate epithelial erosions; DES dry eye syndrome; MGD meibomian gland dysfunction; ATs artificial tears; PFAT's preservative free artificial tears; [place] nuclear sclerotic cataract; PSC posterior subcapsular cataract; ERM epi-retinal membrane; PVD posterior vitreous detachment; RD retinal detachment; DM diabetes mellitus; DR diabetic retinopathy; NPDR non-proliferative diabetic retinopathy; PDR proliferative diabetic retinopathy; CSME clinically significant macular edema; DME diabetic macular edema; dbh dot blot hemorrhages; CWS cotton wool spot; POAG primary open angle glaucoma; C/D cup-to-disc ratio; HVF humphrey visual field; GVF goldmann visual field; OCT optical coherence tomography; IOP intraocular pressure; BRVO Branch retinal vein occlusion; CRVO central retinal vein occlusion; CRAO central retinal artery occlusion; BRAO branch retinal artery occlusion; RT retinal tear; SB scleral buckle; PPV pars plana vitrectomy; VH Vitreous hemorrhage; PRP panretinal laser photocoagulation; IVK intravitreal kenalog; VMT vitreomacular traction; MH Macular hole;  NVD neovascularization of the disc; NVE neovascularization elsewhere; AREDS age related eye disease study; ARMD age related macular degeneration; POAG primary open angle glaucoma; EBMD epithelial/anterior basement membrane dystrophy; ACIOL  anterior chamber intraocular lens; IOL intraocular lens; PCIOL posterior chamber intraocular lens; Phaco/IOL phacoemulsification with intraocular lens placement; [place] photorefractive keratectomy; LASIK laser assisted in situ keratomileusis; HTN hypertension; DM diabetes mellitus; COPD chronic obstructive pulmonary disease

## 2020-07-12 ENCOUNTER — Encounter: Payer: Self-pay | Admitting: Internal Medicine

## 2020-07-12 ENCOUNTER — Ambulatory Visit (INDEPENDENT_AMBULATORY_CARE_PROVIDER_SITE_OTHER): Payer: Medicare Other | Admitting: Internal Medicine

## 2020-07-12 DIAGNOSIS — I2510 Atherosclerotic heart disease of native coronary artery without angina pectoris: Secondary | ICD-10-CM

## 2020-07-12 DIAGNOSIS — I10 Essential (primary) hypertension: Secondary | ICD-10-CM | POA: Diagnosis not present

## 2020-07-12 DIAGNOSIS — I2583 Coronary atherosclerosis due to lipid rich plaque: Secondary | ICD-10-CM

## 2020-07-12 DIAGNOSIS — J069 Acute upper respiratory infection, unspecified: Secondary | ICD-10-CM | POA: Diagnosis not present

## 2020-07-12 DIAGNOSIS — J4530 Mild persistent asthma, uncomplicated: Secondary | ICD-10-CM

## 2020-07-12 NOTE — Assessment & Plan Note (Signed)
Resolved Continue albuterol nebs as needed Continue symbicort BID

## 2020-07-12 NOTE — Assessment & Plan Note (Signed)
Chronic Continue Symbicort  80-4.5 mcg bid Continue albuterol inhaler prn

## 2020-07-12 NOTE — Assessment & Plan Note (Signed)
Chronic BP well controlled Continue bystolic 10 mg daily, imdur 60 mg qd, losartan 25 mg qd, torsemide 20 mg qd

## 2020-07-14 ENCOUNTER — Other Ambulatory Visit: Payer: Self-pay | Admitting: Internal Medicine

## 2020-07-14 ENCOUNTER — Encounter (INDEPENDENT_AMBULATORY_CARE_PROVIDER_SITE_OTHER): Payer: Medicare Other | Admitting: Ophthalmology

## 2020-07-27 DIAGNOSIS — N958 Other specified menopausal and perimenopausal disorders: Secondary | ICD-10-CM | POA: Diagnosis not present

## 2020-07-27 LAB — HM DEXA SCAN

## 2020-08-03 ENCOUNTER — Other Ambulatory Visit: Payer: Self-pay | Admitting: Internal Medicine

## 2020-08-04 ENCOUNTER — Other Ambulatory Visit: Payer: Self-pay | Admitting: Internal Medicine

## 2020-08-08 ENCOUNTER — Other Ambulatory Visit: Payer: Self-pay | Admitting: Internal Medicine

## 2020-08-14 NOTE — Progress Notes (Signed)
Cardiology Office Note:    Date:  08/16/2020   ID:  Samantha [name], DOB [birth year], MRN [id number]  PCP:  [name], MD   [facility] Providers Cardiologist:  Ena Dawley, MD (Inactive) {    Referring MD: [name], MD    History of Present Illness:    Samantha [name] is a 80 y.o. 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 who now presents to clinic for follow-up.  Per review of the record, patient underwent cath in 03/2016 which showed 40% mid RCA stenosis and 50% mid LAD stenosis. The patient continued to have intermittent exertional and resting chest pain that has resolved after she was started on Imdur 30 mg daily.  Saw [name] in 10/2019 prior to surgery for knee replacement. Had myoview 10/2019 which showed no evidence of ischemia or infarction and normal LVEF. TTE 10/2019 with LVEF 60-65%, normal strain, mild dilation of ascending aorta 51mm.  Today, the patient is overall doing okay. Still recovering from right knee surgery and ambulates with a cane. Otherwise, continues to have intermittent chest pressure that is sometimes exertional and sometimes can occur with rest. Pain seems to respond to imdur.  Also has persistent SOB and continues to use a nebulizer and inhalers. Weather makes her breathing much worse.   She is also concerned about having episodes of heart "pounding." These episodes occur several times per day and are associated with shortness of breath. No lightheadedness, dizziness or syncope. No significant LE edema, PND, or orthopnea.  Past Medical History:  Diagnosis Date   [facility])    Abnormal CT of the chest 2008   last CT4-l 2009:  . No f/u suggested    Allergy    Asthma    CAD (coronary artery disease)    a. Coronary CTA 10/16: Coronary Ca score 211, mod  non-obstructive CAD with LM mild plaque (25-50%), mid LAD 50-69%. b. Neg nuc 06/2015.   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   Minimally invasive right hemicolectomy to be done    Neuromuscular disorder ([place])    FIBROMYALGIA   Ocular migraine    OSA (obstructive sleep apnea) 09/2007   dx w/ a sleep study, not on  CPAP   Osteoarthritis    Osteoporosis    Pneumonia    "double" in 2004   PONV (postoperative nausea and vomiting)    Reactive airway disease 01/29/2002   dx of pseudoasthma / vcd in 2005 and nl sprirometry History of dyspnea, 2011,  improved after several medications were changed around Question of COPD, disproved July 06, 2009 with nl pft's       Rheumatoid factor positive    Shingles 11/2009   Sleep apnea    Stroke ([place])    TIA (transient ischemic attack)    x2 - on Plavix for this   Torn rotator cuff    right worse than left,  both are torn   Tumor, 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 Pernell Dupre (done after colon surgery)   CATARACT EXTRACTION W/ INTRAOCULAR LENS  IMPLANT, BILATERAL  2012   COLON SURGERY  10/2014   EYE SURGERY Bilateral    torq lens for cataracts   LEFT HEART CATH AND CORONARY ANGIOGRAPHY N/A 03/22/2016   Procedure: Left  Heart Cath and Coronary Angiography;  Surgeon: Belva Crome, MD;  Location: [place] [facility];  Service: Cardiovascular;  Laterality: N/A;   LUMBAR LAMINECTOMY/DECOMPRESSION MICRODISCECTOMY N/A 01/20/2018   Procedure: L4-5 decompression;  Surgeon: Marybelle Killings, MD;  Location: [place];  Service: Orthopedics;  Laterality: N/A;   THYROIDECTOMY, PARTIAL  1960's   TOTAL KNEE ARTHROPLASTY Right 12/04/2019   Procedure: RIGHT TOTAL KNEE ARTHROPLASTY;  Surgeon: Marybelle Killings, MD;  Location: [place];  Service: Orthopedics;  Laterality: Right;  [place]   "Laser surgery for vaginal cancer; followed by chemotherapy" (06/27/2012)    Current Medications: Current Meds  Medication Sig   acetaminophen (TYLENOL) 500 MG tablet Take 500 mg by mouth every 6 (six) hours as needed for mild pain.    albuterol (PROVENTIL) (2.5 MG/3ML) 0.083% nebulizer solution Take 3 mLs (2.5 mg total) by nebulization every 6 (six) hours as needed for wheezing or shortness of breath.   albuterol (VENTOLIN HFA) 108 (90 Base) MCG/ACT inhaler Inhale 2 puffs into the lungs every 6 (six) hours as needed for wheezing or shortness of breath.   budesonide-formoterol (SYMBICORT) 80-4.5 MCG/ACT inhaler Inhale 2 puffs into the lungs 2 (two) times daily.   BYSTOLIC 10 MG tablet TAKE 1 TABLET(10 MG) BY MOUTH DAILY   Calcium-Magnesium-Zinc (CAL-MAG-ZINC PO) Take 1 tablet by mouth daily.   cetirizine (ZYRTEC) 10 MG tablet TAKE 1 TABLET(10 MG) BY MOUTH DAILY   cholecalciferol (VITAMIN D) 1000 UNITS tablet Take 1,000 Units by mouth every morning.    clopidogrel (PLAVIX) 75 MG tablet TAKE 1 TABLET(75 MG) BY MOUTH DAILY   famotidine (PEPCID) 40 MG tablet TAKE 1 TABLET(40 MG) BY MOUTH DAILY AS NEEDED FOR HEARTBURN OR INDIGESTION   glucose blood test strip Use as instructed   hydroxychloroquine (PLAQUENIL) 200 MG tablet Take 400 mg by mouth daily.    isosorbide mononitrate (IMDUR) 30 MG 24 hr tablet Take 3 tablets (90  mg total) by mouth daily.   Lancets (ONETOUCH ULTRASOFT) lancets Use as instructed   losartan (COZAAR) 25 MG tablet Take 25 mg by mouth daily.   Polyethyl Glycol-Propyl Glycol (SYSTANE) 0.4-0.3 % GEL ophthalmic gel Place 1 application into both eyes daily as needed (for dry eyes).    Potassium 99 MG TABS Take 99 mg by mouth daily.   pregabalin (LYRICA) 75 MG capsule TAKE 1 CAPSULE(75 MG) BY MOUTH AT BEDTIME   REPATHA SURECLICK 631 MG/ML SOAJ INJECT 1 PEN UNDER THE SKIN EVERY 14 DAYS   tiZANidine (ZANAFLEX) 4 MG tablet Take 1 tablet (4 mg total) by mouth 2 (  two) times daily as needed for muscle spasms.   torsemide (DEMADEX) 20 MG tablet TAKE 1 TABLET(20 MG) BY MOUTH DAILY   [DISCONTINUED] isosorbide mononitrate (IMDUR) 60 MG 24 hr tablet TAKE 1 TABLET(60 MG) BY MOUTH DAILY     Allergies:   Bactrim [sulfamethoxazole-trimethoprim], Cefuroxime axetil, Oxycodone, Pravastatin, Seldane [terfenadine], Zocor [simvastatin], Tramadol, Atorvastatin, Latex, Lime flavor [flavoring agent], Metformin and related, Rosuvastatin, and Tape   Social History   Socioeconomic History   Marital status: Married    Spouse name: Ilona Sorrel   Number of children: 2   Years of education: masters   Highest education level: Not on file  Occupational History   Occupation: Retired, disable since 2000    Employer: RETIRED  Tobacco Use   Smoking status: Former    Packs/day: 0.25    Years: 5.00    Pack years: 1.25    Types: Cigarettes    Quit date: 01/29/1998    Years since quitting: 22.5   Smokeless tobacco: Never   Tobacco comments:    Quit in 2001  Vaping Use   Vaping Use: Never used  Substance and Sexual Activity   Alcohol use: No    Alcohol/week: 0.0 standard drinks   Drug use: No   Sexual activity: Never  Other Topics Concern   Not on file  Social History Narrative   On disability since 2000--- also husband has MS   Education. College.   Right handed.   Social Determinants of Health   Financial  Resource Strain: Low Risk    Difficulty of Paying Living Expenses: Not hard at all  Food Insecurity: No Food Insecurity   Worried About Charity fundraiser in the Last Year: Never true   [place] in the Last Year: Never true  Transportation Needs: No Transportation Needs   Lack of Transportation (Medical): No   Lack of Transportation (Non-Medical): No  Physical Activity: Sufficiently Active   Days of Exercise per Week: 7 days   Minutes of Exercise per Session: 30 min  Stress: No Stress Concern Present   Feeling of Stress : Not at all  Social Connections: Not on file     Family History: The patient's family history includes Allergies in her sister; Aneurysm in her brother; Asthma in her paternal grandmother and sister; Diabetes in her brother and brother; Emphysema in her brother; Heart disease in her brother, father, mother, and another family member; Kidney failure in her brother; Lung cancer in her mother; Parkinsonism in her sister; Stroke in her brother, maternal grandmother, and paternal grandmother. There is no history of Breast cancer, Colon cancer, Heart attack, Esophageal cancer, Rectal cancer, or Stomach cancer.  ROS:   Please see the history of present illness.    Review of Systems  Constitutional:  Negative for chills and fever.  HENT:  Negative for sore throat.   Eyes:  Negative for blurred vision.  Respiratory:  Positive for shortness of breath and wheezing.   Cardiovascular:  Positive for chest pain and palpitations. Negative for orthopnea, claudication, leg swelling and PND.  Gastrointestinal:  Positive for heartburn.  Genitourinary:  Negative for hematuria.  Musculoskeletal:  Positive for joint pain and myalgias.  Neurological:  Negative for dizziness and loss of consciousness.  Psychiatric/Behavioral:  Negative for substance abuse.     EKGs/Labs/Other Studies Reviewed:    The following studies were reviewed today: Myoview 10/2019: Nuclear stress EF:  63%. There was no ST segment deviation noted during stress. No T wave inversion  was noted during stress. The study is normal. This is a low risk study. The left ventricular ejection fraction is normal (55-65%).   1. Normal study without ischemia or infarction. 2. Normal LVEF, 63%. 3. This is a low-risk study.  TTE 11/13/19: IMPRESSIONS     1. Left ventricular ejection fraction, by estimation, is 60 to 65%. The  left ventricle has normal function. The left ventricle has no regional  wall motion abnormalities. There is mild left ventricular hypertrophy.  Left ventricular diastolic parameters  are consistent with Grade I diastolic dysfunction (impaired relaxation).  GLS -21.1%.   2. Right ventricular systolic function is normal. The right ventricular  size is normal. Tricuspid regurgitation signal is inadequate for assessing  PA pressure.   3. The aortic valve is tricuspid. Aortic valve regurgitation is not  visualized. Mild aortic valve sclerosis is present, with no evidence of  aortic valve stenosis.   4. Aortic dilatation noted. There is mild dilatation of the ascending  aorta, measuring 38 mm.   5. The mitral valve is normal in structure. Trivial mitral valve  regurgitation. No evidence of mitral stenosis.   6. The inferior vena cava is normal in size with greater than 50%  respiratory variability, suggesting right atrial pressure of 3 mmHg.   Comparison(s): 10/20/14 EF 60-65%.   [place] 2018: Widely patent coronary arteries with 30-50% mid LAD narrowing and 40% mid RCA narrowing. Coronary arteries are tortuous. Right dominant coronary anatomy. LAD wraps around the left ventricular apex. Normal LV function. Normal LVEDP. EF 60%. Suspect chest discomfort is not ischemic.    EKG:  No new tracing  Recent Labs: 06/24/2020: ALT 16; B Natriuretic Peptide 38.5; BUN 16; Creatinine, Ser 0.97; Hemoglobin 12.9; Platelets 163; Potassium 3.8; Sodium 138  Recent Lipid Panel     Component Value Date/Time   CHOL 131 04/14/2020 1207   CHOL 135 10/01/2018 1036   TRIG 87.0 04/14/2020 1207   HDL 82.90 04/14/2020 1207   HDL 84 10/01/2018 1036   CHOLHDL 2 04/14/2020 1207   VLDL 17.4 04/14/2020 1207   LDLCALC 30 04/14/2020 1207   LDLCALC 34 10/01/2018 1036   LDLDIRECT 126.2 12/07/2010 1039     Risk Assessment/Calculations:           Physical Exam:    VS:  BP (!) 148/82   Pulse 80   Ht 5\' 2"  (1.575 m)   Wt 199 lb 3.2 oz (90.4 kg)   SpO2 94%   BMI 36.43 kg/m     Wt Readings from Last 3 Encounters:  08/16/20 199 lb 3.2 oz (90.4 kg)  07/12/20 199 lb (90.3 kg)  06/24/20 197 lb 1.5 oz (89.4 kg)     GEN:  Well nourished, well developed in no acute distress HEENT: Normal NECK: No JVD; No carotid bruits CARDIAC: RRR, 1/6 systolic murmur. No rubs, gallops RESPIRATORY:  Clear to auscultation without rales, wheezing or rhonchi  ABDOMEN: Soft, non-tender, non-distended MUSCULOSKELETAL:  No edema; No deformity  SKIN: Warm and dry NEUROLOGIC:  Alert and oriented x 3 PSYCHIATRIC:  Normal affect   ASSESSMENT:    1. Coronary artery disease involving native coronary artery of native heart without angina pectoris   2. Palpitations   3. Chest pain of uncertain etiology   4. Hyperlipidemia, unspecified hyperlipidemia type   5. Chronic diastolic CHF (congestive heart failure) ([place])   6. TIA (transient ischemic attack)   7. Essential hypertension   8. Reactive airway disease without complication, unspecified asthma severity, unspecified  whether persistent    PLAN:    In order of problems listed above:  #Moderate nonobstructive disease: Cardiac cath 2018 with 40% mid RCA 50% mid LAD treated medically. Continues to have intermittent chest discomfort that is sometimes exertional and sometimes at rest. Recent stress test 10/2019 with no evidence of ischemia or infarction. TTE with normal LVEF, no significant valve disease. Will optimize medications at this time  and monitor response.  -Continue plavix 75mg  daily -Continue losartan 25mg  daily -Increase imdur to 90mg  daily -Continue bystolic 10mg  daily -Continue repatha 140mg  q2 weeks -Can consider ranexa if needed  #Palpitations: Patient with sensation of her heart "pounding" several times per day with associated SOB. Has history of PVCs, however, this feels different. Will check monitor to assess further. -Check 3 day zio -Continue bystolic 10mg  daily  #Chronic Diastolic Heart Failure: Doing well and euvolemic on examination. -Continue torsemide 20mg  daily -Continue losartan 25mg  daily -Consider farxiga at next visit -Low Na diet  #HTN: Elevated 130-140s at home. Notably had losartan decreased due to soft blood pressures.  -Increase imdur to 90mg  daily -Continue bystolic 10mg  daily -Continue losartan 25mg  daily (did not tolerate higher dose due to hypotension)  #HLD: -Continue repatha 140mg  q2 weeks  #Palpitations: #Symptomatic PVCs: -Continue bystolic  #History of TIA: -Continue plavix 75mg  daily  #Reactive Airway Disease: Patient with SOB and wheezing secondary to known reactive airway disease. Would like to maintain her on bystolic due to it being most beta-1 selective and less likely to make her bronchospasm worse.  -Will check with insurance about coverage of bystolic due to significant reactive airway disease as it is truly the best option for her going forward        Medication Adjustments/Labs and Tests Ordered: Current medicines are reviewed at length with the patient today.  Concerns regarding medicines are outlined above.  Orders Placed This Encounter  Procedures   LONG TERM MONITOR (3-14 DAYS)   Meds ordered this encounter  Medications   isosorbide mononitrate (IMDUR) 30 MG 24 hr tablet    Sig: Take 3 tablets (90 mg total) by mouth daily.    Dispense:  270 tablet    Refill:  1    Dose increase    Patient Instructions  Medication Instructions:    INCREASE YOUR IMDUR TO 90 MG BY MOUTH DAILY  *If you need a refill on your cardiac medications before your next appointment, please call your pharmacy*   Testing/Procedures:  ZIO XT- Long Term Monitor Instructions  Your physician has requested you wear a ZIO patch monitor for 3 days.  This is a single patch monitor. [employer] supplies one patch monitor per enrollment. Additional stickers are not available. Please do not apply patch if you will be having a Nuclear Stress Test,  Echocardiogram, Cardiac CT, MRI, or Chest Xray during the period you would be wearing the  monitor. The patch cannot be worn during these tests. You cannot remove and re-apply the  ZIO XT patch monitor.  Your ZIO patch monitor will be mailed 3 day USPS to your address on file. It may take 3-5 days  to receive your monitor after you have been enrolled.  Once you have received your monitor, please review the enclosed instructions. Your monitor  has already been registered assigning a specific monitor serial # to you.  Billing and Patient Assistance Program Information  We have supplied [employer] with any of your insurance information on file for billing purposes. [employer] offers a sliding scale Patient Assistance  Program for patients that do not have  insurance, or whose insurance does not completely cover the cost of the ZIO monitor.  You must apply for the Patient Assistance Program to qualify for this discounted rate.  To apply, please call [employer] at [phone], select option 4, select option 2, ask to apply for  Patient Assistance Program. Theodore Demark will ask your household income, and how many people  are in your household. They will quote your out-of-pocket cost based on that information.  [employer] will also be able to set up a 53-month, interest-free payment plan if needed.  Applying the monitor   Shave hair from upper left chest.  Hold abrader disc by orange tab. Rub abrader in 40 strokes over the upper  left chest as  indicated in your monitor instructions.  Clean area with 4 enclosed alcohol pads. Let dry.  Apply patch as indicated in monitor instructions. Patch will be placed under collarbone on left  side of chest with arrow pointing upward.  Rub patch adhesive wings for 2 minutes. Remove white label marked "1". Remove the white  label marked "2". Rub patch adhesive wings for 2 additional minutes.  While looking in a mirror, press and release button in center of patch. A small green light will  flash 3-4 times. This will be your only indicator that the monitor has been turned on.  Do not shower for the first 24 hours. You may shower after the first 24 hours.  Press the button if you feel a symptom. You will hear a small click. Record Date, Time and  Symptom in the Patient Logbook.  When you are ready to remove the patch, follow instructions on the last 2 pages of Patient  Logbook. Stick patch monitor onto the last page of Patient Logbook.  Place Patient Logbook in the blue and white box. Use locking tab on box and tape box closed  securely. The blue and white box has prepaid postage on it. Please place it in the mailbox as  soon as possible. Your physician should have your test results approximately 7 days after the  monitor has been mailed back to [facility].  Call [facility] at [phone] if you have questions regarding  your ZIO XT patch monitor. Call them immediately if you see an orange light blinking on your  monitor.  If your monitor falls off in less than 4 days, contact our Monitor department at [phone].  If your monitor becomes loose or falls off after 4 days call [employer] at [phone] for  suggestions on securing your monitor    Follow-Up:  4-6 MONTHS WITH AN EXTENDER IN THE OFFICE     Signed, Freada Bergeron, MD  08/16/2020 12:40 PM    [place]

## 2020-08-16 ENCOUNTER — Telehealth: Payer: Self-pay | Admitting: *Deleted

## 2020-08-16 ENCOUNTER — Encounter: Payer: Self-pay | Admitting: Cardiology

## 2020-08-16 ENCOUNTER — Ambulatory Visit (INDEPENDENT_AMBULATORY_CARE_PROVIDER_SITE_OTHER): Payer: Medicare Other | Admitting: Cardiology

## 2020-08-16 ENCOUNTER — Ambulatory Visit (INDEPENDENT_AMBULATORY_CARE_PROVIDER_SITE_OTHER): Payer: Medicare Other

## 2020-08-16 ENCOUNTER — Other Ambulatory Visit: Payer: Self-pay

## 2020-08-16 VITALS — BP 148/82 | HR 80 | Ht 62.0 in | Wt 199.2 lb

## 2020-08-16 DIAGNOSIS — J45909 Unspecified asthma, uncomplicated: Secondary | ICD-10-CM

## 2020-08-16 DIAGNOSIS — R079 Chest pain, unspecified: Secondary | ICD-10-CM

## 2020-08-16 DIAGNOSIS — R002 Palpitations: Secondary | ICD-10-CM | POA: Diagnosis not present

## 2020-08-16 DIAGNOSIS — I2510 Atherosclerotic heart disease of native coronary artery without angina pectoris: Secondary | ICD-10-CM

## 2020-08-16 DIAGNOSIS — I5032 Chronic diastolic (congestive) heart failure: Secondary | ICD-10-CM | POA: Diagnosis not present

## 2020-08-16 DIAGNOSIS — I10 Essential (primary) hypertension: Secondary | ICD-10-CM

## 2020-08-16 DIAGNOSIS — E785 Hyperlipidemia, unspecified: Secondary | ICD-10-CM | POA: Diagnosis not present

## 2020-08-16 DIAGNOSIS — G459 Transient cerebral ischemic attack, unspecified: Secondary | ICD-10-CM

## 2020-08-16 MED ORDER — ISOSORBIDE MONONITRATE ER 30 MG PO TB24: 90.0000 mg | ORAL_TABLET | Freq: Every day | ORAL | 1 refills | Status: DC

## 2020-08-16 NOTE — Progress Notes (Unsigned)
Patient enrolled for [employer] to mail a 3 day ZIO XT monitor to address on file.

## 2020-08-16 NOTE — Patient Instructions (Signed)
Medication Instructions:   INCREASE YOUR IMDUR TO 90 MG BY MOUTH DAILY  *If you need a refill on your cardiac medications before your next appointment, please call your pharmacy*   Testing/Procedures:  ZIO XT- Long Term Monitor Instructions  Your physician has requested you wear a ZIO patch monitor for 3 days.  This is a single patch monitor. [employer] supplies one patch monitor per enrollment. Additional stickers are not available. Please do not apply patch if you will be having a Nuclear Stress Test,  Echocardiogram, Cardiac CT, MRI, or Chest Xray during the period you would be wearing the  monitor. The patch cannot be worn during these tests. You cannot remove and re-apply the  ZIO XT patch monitor.  Your ZIO patch monitor will be mailed 3 day USPS to your address on file. It may take 3-5 days  to receive your monitor after you have been enrolled.  Once you have received your monitor, please review the enclosed instructions. Your monitor  has already been registered assigning a specific monitor serial # to you.  Billing and Patient Assistance Program Information  We have supplied [employer] with any of your insurance information on file for billing purposes. [employer] offers a sliding scale Patient Assistance Program for patients that do not have  insurance, or whose insurance does not completely cover the cost of the ZIO monitor.  You must apply for the Patient Assistance Program to qualify for this discounted rate.  To apply, please call [employer] at [phone], select option 4, select option 2, ask to apply for  Patient Assistance Program. Theodore Demark will ask your household income, and how many people  are in your household. They will quote your out-of-pocket cost based on that information.  [employer] will also be able to set up a 40-month, interest-free payment plan if needed.  Applying the monitor   Shave hair from upper left chest.  Hold abrader disc by orange tab. Rub abrader in 40  strokes over the upper left chest as  indicated in your monitor instructions.  Clean area with 4 enclosed alcohol pads. Let dry.  Apply patch as indicated in monitor instructions. Patch will be placed under collarbone on left  side of chest with arrow pointing upward.  Rub patch adhesive wings for 2 minutes. Remove white label marked "1". Remove the white  label marked "2". Rub patch adhesive wings for 2 additional minutes.  While looking in a mirror, press and release button in center of patch. A small green light will  flash 3-4 times. This will be your only indicator that the monitor has been turned on.  Do not shower for the first 24 hours. You may shower after the first 24 hours.  Press the button if you feel a symptom. You will hear a small click. Record Date, Time and  Symptom in the Patient Logbook.  When you are ready to remove the patch, follow instructions on the last 2 pages of Patient  Logbook. Stick patch monitor onto the last page of Patient Logbook.  Place Patient Logbook in the blue and white box. Use locking tab on box and tape box closed  securely. The blue and white box has prepaid postage on it. Please place it in the mailbox as  soon as possible. Your physician should have your test results approximately 7 days after the  monitor has been mailed back to [facility].  Call [place] at [phone] if you have questions regarding  your ZIO XT patch  monitor. Call them immediately if you see an orange light blinking on your  monitor.  If your monitor falls off in less than 4 days, contact our Monitor department at [phone].  If your monitor becomes loose or falls off after 4 days call [employer] at [phone] for  suggestions on securing your monitor    Follow-Up:  4-6 MONTHS WITH AN EXTENDER IN THE OFFICE

## 2020-08-16 NOTE — Telephone Encounter (Signed)
-----   Message from Jennefer Bravo sent at 08/16/2020 10:01 AM EDT ----- Regarding: RE: 3 DAY ZIO PER [place] Done  ----- Message ----- From: [name], LPN Sent: 08/25/9789   9:28 AM EDT To: Jennefer Bravo, Katrina Theda [place] Subject: 3 DAY ZIO PER [place]                        [name] wants this pt to have a 3 day zio for palpitations. Order is in.  Can you enroll and let me know when you do?  Thanks, [employer]

## 2020-08-18 ENCOUNTER — Other Ambulatory Visit: Payer: Self-pay | Admitting: Internal Medicine

## 2020-08-19 DIAGNOSIS — R002 Palpitations: Secondary | ICD-10-CM

## 2020-08-19 DIAGNOSIS — R079 Chest pain, unspecified: Secondary | ICD-10-CM | POA: Diagnosis not present

## 2020-08-29 DIAGNOSIS — R002 Palpitations: Secondary | ICD-10-CM | POA: Diagnosis not present

## 2020-08-29 DIAGNOSIS — R079 Chest pain, unspecified: Secondary | ICD-10-CM | POA: Diagnosis not present

## 2020-09-05 ENCOUNTER — Telehealth: Payer: Self-pay

## 2020-09-05 MED ORDER — NEBIVOLOL HCL 10 MG PO TABS: ORAL_TABLET | ORAL | 2 refills | Status: DC

## 2020-09-05 NOTE — Telephone Encounter (Signed)
**Note De-Identified  Obfuscation** I called [facility] to get the pts ins info I need to do this PA and was advised by the pharmacist that a PA is not required that they are aware of as the pts Bystolic/Nebivolol needs a new refill sent in as the last one has run out.  I have sent the pts Nebivolol 10 mg #90 with 2 refills to [facility] to fill and per the pharmacist they will reach out to [place] if a PA is needed.

## 2020-09-05 NOTE — Telephone Encounter (Signed)
**Note De-identified  Obfuscation** -----  **Note De-Identified  Obfuscation** Message from Richmond Campbell, LPN sent at [id number]  1:34 PM EDT ----- Per pt needs prior auth on her Bystolic ./cy

## 2020-09-09 ENCOUNTER — Other Ambulatory Visit: Payer: Self-pay

## 2020-09-09 ENCOUNTER — Emergency Department (HOSPITAL_COMMUNITY)
Admission: EM | Admit: 2020-09-09 | Discharge: 2020-09-09 | Disposition: A | Discharge status: Home / Self Care | Payer: Medicare Other | Attending: Emergency Medicine | Admitting: Emergency Medicine

## 2020-09-09 ENCOUNTER — Emergency Department (HOSPITAL_COMMUNITY): Payer: Medicare Other

## 2020-09-09 ENCOUNTER — Encounter (HOSPITAL_COMMUNITY): Payer: Self-pay

## 2020-09-09 ENCOUNTER — Telehealth: Payer: Self-pay | Admitting: *Deleted

## 2020-09-09 DIAGNOSIS — J45909 Unspecified asthma, uncomplicated: Secondary | ICD-10-CM | POA: Insufficient documentation

## 2020-09-09 DIAGNOSIS — Z7902 Long term (current) use of antithrombotics/antiplatelets: Secondary | ICD-10-CM | POA: Insufficient documentation

## 2020-09-09 DIAGNOSIS — I110 Hypertensive heart disease with heart failure: Secondary | ICD-10-CM | POA: Insufficient documentation

## 2020-09-09 DIAGNOSIS — Z794 Long term (current) use of insulin: Secondary | ICD-10-CM | POA: Diagnosis not present

## 2020-09-09 DIAGNOSIS — I6523 Occlusion and stenosis of bilateral carotid arteries: Secondary | ICD-10-CM | POA: Diagnosis not present

## 2020-09-09 DIAGNOSIS — R079 Chest pain, unspecified: Secondary | ICD-10-CM | POA: Diagnosis not present

## 2020-09-09 DIAGNOSIS — R1110 Vomiting, unspecified: Secondary | ICD-10-CM | POA: Diagnosis not present

## 2020-09-09 DIAGNOSIS — R110 Nausea: Secondary | ICD-10-CM | POA: Diagnosis not present

## 2020-09-09 DIAGNOSIS — I5032 Chronic diastolic (congestive) heart failure: Secondary | ICD-10-CM | POA: Insufficient documentation

## 2020-09-09 DIAGNOSIS — R0789 Other chest pain: Secondary | ICD-10-CM | POA: Diagnosis not present

## 2020-09-09 DIAGNOSIS — R42 Dizziness and giddiness: Secondary | ICD-10-CM | POA: Diagnosis not present

## 2020-09-09 DIAGNOSIS — I10 Essential (primary) hypertension: Secondary | ICD-10-CM | POA: Diagnosis not present

## 2020-09-09 DIAGNOSIS — Z85038 Personal history of other malignant neoplasm of large intestine: Secondary | ICD-10-CM | POA: Insufficient documentation

## 2020-09-09 DIAGNOSIS — R112 Nausea with vomiting, unspecified: Secondary | ICD-10-CM | POA: Insufficient documentation

## 2020-09-09 DIAGNOSIS — R0602 Shortness of breath: Secondary | ICD-10-CM | POA: Diagnosis not present

## 2020-09-09 DIAGNOSIS — R6889 Other general symptoms and signs: Secondary | ICD-10-CM | POA: Diagnosis not present

## 2020-09-09 DIAGNOSIS — Z743 Need for continuous supervision: Secondary | ICD-10-CM | POA: Diagnosis not present

## 2020-09-09 DIAGNOSIS — Z91040 Latex allergy status: Secondary | ICD-10-CM | POA: Insufficient documentation

## 2020-09-09 DIAGNOSIS — Z96651 Presence of right artificial knee joint: Secondary | ICD-10-CM | POA: Insufficient documentation

## 2020-09-09 DIAGNOSIS — E1169 Type 2 diabetes mellitus with other specified complication: Secondary | ICD-10-CM | POA: Insufficient documentation

## 2020-09-09 DIAGNOSIS — Z87891 Personal history of nicotine dependence: Secondary | ICD-10-CM | POA: Diagnosis not present

## 2020-09-09 DIAGNOSIS — I2510 Atherosclerotic heart disease of native coronary artery without angina pectoris: Secondary | ICD-10-CM | POA: Diagnosis not present

## 2020-09-09 DIAGNOSIS — E785 Hyperlipidemia, unspecified: Secondary | ICD-10-CM | POA: Insufficient documentation

## 2020-09-09 DIAGNOSIS — E119 Type 2 diabetes mellitus without complications: Secondary | ICD-10-CM | POA: Diagnosis not present

## 2020-09-09 LAB — CBC WITH DIFFERENTIAL/PLATELET
Abs Immature Granulocytes: 0.03 10*3/uL (ref 0.00–0.07)
Basophils Absolute: 0 10*3/uL (ref 0.0–0.1)
Basophils Relative: 1 %
Eosinophils Absolute: 0.1 10*3/uL (ref 0.0–0.5)
Eosinophils Relative: 2 %
HCT: 38.6 % (ref 36.0–46.0)
Hemoglobin: 13 g/dL (ref 12.0–15.0)
Immature Granulocytes: 1 %
Lymphocytes Relative: 25 %
Lymphs Abs: 0.9 10*3/uL (ref 0.7–4.0)
MCH: 28.7 pg (ref 26.0–34.0)
MCHC: 33.7 g/dL (ref 30.0–36.0)
MCV: 85.2 fL (ref 80.0–100.0)
Monocytes Absolute: 0.3 10*3/uL (ref 0.1–1.0)
Monocytes Relative: 7 %
Neutro Abs: 2.5 10*3/uL (ref 1.7–7.7)
Neutrophils Relative %: 64 %
Platelets: 128 10*3/uL — ABNORMAL LOW (ref 150–400)
RBC: 4.53 MIL/uL (ref 3.87–5.11)
RDW: 13.9 % (ref 11.5–15.5)
WBC: 3.8 10*3/uL — ABNORMAL LOW (ref 4.0–10.5)
nRBC: 0 % (ref 0.0–0.2)

## 2020-09-09 LAB — COMPREHENSIVE METABOLIC PANEL
ALT: 15 U/L (ref 0–44)
AST: 19 U/L (ref 15–41)
Albumin: 4.1 g/dL (ref 3.5–5.0)
Alkaline Phosphatase: 52 U/L (ref 38–126)
Anion gap: 10 (ref 5–15)
BUN: 14 mg/dL (ref 8–23)
CO2: 25 mmol/L (ref 22–32)
Calcium: 9 mg/dL (ref 8.9–10.3)
Chloride: 102 mmol/L (ref 98–111)
Creatinine, Ser: 1.04 mg/dL — ABNORMAL HIGH (ref 0.44–1.00)
GFR, Estimated: 55 mL/min — ABNORMAL LOW (ref >60)
Glucose, Bld: 147 mg/dL — ABNORMAL HIGH (ref 70–99)
Potassium: 3.4 mmol/L — ABNORMAL LOW (ref 3.5–5.1)
Sodium: 137 mmol/L (ref 135–145)
Total Bilirubin: 0.7 mg/dL (ref 0.3–1.2)
Total Protein: 6.7 g/dL (ref 6.5–8.1)

## 2020-09-09 LAB — BRAIN NATRIURETIC PEPTIDE: B Natriuretic Peptide: 45.1 pg/mL (ref 0.0–100.0)

## 2020-09-09 LAB — TROPONIN I (HIGH SENSITIVITY)
Troponin I (High Sensitivity): <2 ng/L (ref <18)
Troponin I (High Sensitivity): <2 ng/L (ref <18)

## 2020-09-09 MED ORDER — RANOLAZINE ER 500 MG PO TB12: 500.0000 mg | ORAL_TABLET | Freq: Two times a day (BID) | ORAL | 0 refills | Status: DC

## 2020-09-09 MED ORDER — MECLIZINE HCL 25 MG PO TABS
12.5000 mg | ORAL_TABLET | Freq: Once | ORAL | Status: AC
  Administered 2020-09-09: 12.5 mg via ORAL
  Filled 2020-09-09: qty 1

## 2020-09-09 MED ORDER — MECLIZINE HCL 12.5 MG PO TABS: 12.5000 mg | ORAL_TABLET | Freq: Three times a day (TID) | ORAL | 0 refills | Status: DC | PRN

## 2020-09-09 NOTE — ED Provider Notes (Signed)
Nauseated, threw up then chest pain Exerted without chest pain Atypical story EKG within acceptable limits Initial trop negative  Vitals:  09/09/20 1630 09/09/20 1645 BP: (!) 164/80 (!) 173/83 Pulse: (!) 56 63 Resp: 17 13 Temp:   SpO2: 99% 99%  Previous team spoke with cardiology.  They recommend starting Ranexa, which is previously prescribed.  Delta troponin negative.  Patient requesting a prescription for meclizine at home.  She has a reassuring physical exam here, believe that this is reasonable.  I do not believe that she needs cranial imaging at this time.  I believe that she can be safely discharged for close outpatient follow-up with the cardiology team, which has already been arranged by the previous team.  Patient is comfortable with the plan.  Strict return precautions discussed.   Claud Kelp, MD 09/09/20 Tilden Dome, MD 09/12/20 [phone]

## 2020-09-09 NOTE — ED Notes (Signed)
ED PA at BS 

## 2020-09-09 NOTE — ED Notes (Signed)
Pt alert, NAD, calm, interactive, resps e/u, speaking in clear complete sentences.  

## 2020-09-09 NOTE — Telephone Encounter (Signed)
Called and spoke with the pts daughter, due to pt being in the ER at this time awaiting discharge, and cell phone goes straight to voicemail. Was able to schedule a post-ER visit for the pt with the daughter, for next Tuesday 8/16 at [facility], with Cecilie Kicks NP.  Advised the pts daughter to endorse this to the pt, and call back as needed if this is of any conflict with her schedule. Daughter verbalized understanding and agrees with this plan.  Daughter was more than gracious for all the assistance provided.   Off note:  pt was started on Ranexa 500 mg po bid by ER MD today.

## 2020-09-09 NOTE — Telephone Encounter (Signed)
-----   Message from Werner Lean, MD sent at 09/09/2020  3:35 PM EDT ----- Regarding: Follow up appointment with Me or [facility] Samantha [name],  Samantha [name] came in with atypical chest pains (related to a weird smell of a magazine) but there was questions of if she was having worsening angina.  Reading Dr. Lucy Antigua notes we will likely start Ranexa 500 mg.  She'll need follow up post ED visit, if Dr. Lucy Antigua POD is unable I can see her one time instead.  Thanks,  [employer]

## 2020-09-09 NOTE — ED Provider Notes (Signed)
[facility] EMERGENCY DEPARTMENT Provider Note   CSN: [id number] Arrival date & time: 09/09/20  1333     History Chief Complaint  Patient presents with   Chest Pain    Samantha [name] is a 80 y.o. female with a past medical history of A. fib, CHF, GERD, hypertension presenting to the ED with multiple complaints.  She reports nausea that began yesterday.  Today had an episode of nonbloody, nonbilious emesis.  Started having dizziness today as well.  This is worse with movement and feels similar to vertigo.  She has had intermittent chest discomfort for several weeks and mentioned this to her cardiologist at her visit last month.  She had the chest pain got worse this morning when her dizziness and nausea began after she opened a magazine with an odd smell. Pain is improved with nitroglycerin given by EMS.  She actually was doing yard work this morning as well as sweeping with a broom but did not have any chest pain at that time.  Reports some worsening shortness of breath from her baseline.  Denies any leg swelling, vision changes, numbness in arms or legs.  She has had intermittent headache for the past several weeks as well.  States that her cardiologist increased her dose of Imdur from once a day to 3 times a day.  This was done in the past month.  She denies any changes to activity or appetite.  No loss of consciousness, head injury.  Does report increased stress in the past few months dealing with the loss of her husband and her daughter being diagnosed with cancer.  She is her primary caretaker.  She denies any cough, abdominal pain       Past Medical History:  Diagnosis Date   A-fib ([place])    Abnormal CT of the chest 2008   last CT4-l 2009:  . No f/u suggested    Allergy    Asthma    CAD (coronary artery disease)    a. Coronary CTA 10/16: Coronary Ca score 211, mod non-obstructive CAD with LM mild plaque (25-50%), mid LAD 50-69%. b. Neg nuc 06/2015.   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   Minimally invasive right hemicolectomy to be done    Neuromuscular disorder (HCC)    FIBROMYALGIA   Ocular migraine    OSA (obstructive sleep apnea) 09/2007   dx w/ a sleep study, not on  CPAP   Osteoarthritis    Osteoporosis    Pneumonia    "double" in 2004   PONV (postoperative nausea and vomiting)    Reactive airway disease 01/29/2002   dx of pseudoasthma / vcd in 2005 and nl sprirometry History of dyspnea, 2011,  improved after several medications were changed around Question of COPD, disproved July 06, 2009 with nl pft's       Rheumatoid factor positive    Shingles 11/2009   Sleep apnea    Stroke ([place])    TIA (transient ischemic attack)    x2 - on Plavix for this   Torn rotator  cuff    right worse than left, both are torn   Tumor, 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   Type II diabetes mellitus ([place])    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   Vaginal dysplasia     Patient Active Problem List   Diagnosis Date Noted   URI (upper respiratory infection) 07/11/2020   Difficulty urinating 04/14/2020   Vitamin D deficiency 04/13/2020   S/P TKR (total knee replacement), 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   Aortic atherosclerosis ([place]) 11/03/2019   Posterior vitreous detachment of right eye 07/15/2019   Posterior vitreous detachment of left eye 07/15/2019   Diabetes mellitus without complication ([place]) [id number]   Recurrent corneal erosion, 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   Raynaud phenomenon 05/06/2019   Trigger finger, left ring finger 03/18/2019   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 01/28/2018    Sacral back pain 10/16/2017   Chronic left upper quadrant pain 07/23/2017   Poor balance 03/14/2017   Right shoulder pain 03/14/2017   Angina pectoris ([place]) 03/22/2016   Hypertensive heart disease 07/07/2015   Essential hypertension 07/07/2015   Lightheadedness 07/07/2015   Plantar fasciitis, left 03/22/2015   Coronary artery disease due to lipid rich plaque 01/05/2015   Chronic diastolic CHF (congestive heart failure), NYHA class 2 ([place]) 01/05/2015   Malignant neoplasm of ascending colon  pT1, pN0, rM0 s/p robotic colectomy 11/11/2014 11/11/2014   Migraine (Ocular) 01/05/2014   VBI (vertebrobasilar insufficiency) 08/22/2012   TIA (transient ischemic attack) 06/27/2012   DJD (degenerative joint disease) 02/02/2011   Varicose veins of legs 06/06/2010   Palpitations 11/23/2008   UTI'S, RECURRENT 09/28/2008   Fibromyalgia 08/15/2007   DM II (diabetes mellitus, type II), w/ neuropathy 05/21/2006   Hyperlipidemia 05/21/2006   Asthma 01/29/2002    Past Surgical History:  Procedure Laterality Date   ABDOMINAL HYSTERECTOMY  1980   NO oophorectomy per pt    ANTERIOR CERVICAL DECOMP/DISCECTOMY FUSION  2001   C 3, C4 and C5 plate and screws   BREAST BIOPSY Right 1999   BUNIONECTOMY Left ~ [place]     2018 By Dr. Pernell Dupre (done after colon surgery)   CATARACT EXTRACTION W/ INTRAOCULAR LENS  IMPLANT, BILATERAL  2012   COLON SURGERY  10/2014   EYE SURGERY Bilateral    torq lens for cataracts   LEFT HEART CATH AND CORONARY ANGIOGRAPHY N/A 03/22/2016   Procedure: Left Heart Cath and Coronary Angiography;  Surgeon: Belva Crome, MD;  Location: [facility];  Service: Cardiovascular;  Laterality: N/A;   LUMBAR LAMINECTOMY/DECOMPRESSION MICRODISCECTOMY N/A 01/20/2018   Procedure: L4-5 decompression;  Surgeon: Marybelle Killings, MD;  Location: [facility];  Service: Orthopedics;  Laterality: N/A;   THYROIDECTOMY, PARTIAL  1960's   TOTAL KNEE ARTHROPLASTY Right  12/04/2019   Procedure: RIGHT TOTAL KNEE ARTHROPLASTY;  Surgeon: Marybelle Killings, MD;  Location: [facility];  Service: Orthopedics;  Laterality: Right;  RNFA APRIL [place]   "Laser surgery for vaginal cancer; followed by chemotherapy" (06/27/2012)     OB History     Gravida  2   Para      Term      Preterm      AB      Living         SAB      IAB      Ectopic      Multiple  Live Births              Family History  Problem Relation Age of Onset   Heart disease Father    Heart disease Mother    Lung cancer Mother    Allergies Sister    Parkinsonism Sister        possible   Asthma Sister    Asthma Paternal Grandmother    Stroke Paternal Grandmother    Heart disease Other        paternal grandparents, maternal grandparents,    Heart disease Brother    Emphysema Brother    Aneurysm Brother        x3   Kidney failure Brother    Diabetes Brother    Diabetes Brother    Stroke Brother    Stroke Maternal Grandmother    Breast cancer Neg Hx    Colon cancer Neg Hx    Heart attack Neg Hx    Esophageal cancer Neg Hx    Rectal cancer Neg Hx    Stomach cancer Neg Hx     Social History   Tobacco Use   Smoking status: Former    Packs/day: 0.25    Years: 5.00    Pack years: 1.25    Types: Cigarettes    Quit date: 01/29/1998    Years since quitting: 22.6   Smokeless tobacco: Never   Tobacco comments:    Quit in 2001  Vaping Use   Vaping Use: Never used  Substance Use Topics   Alcohol use: No    Alcohol/week: 0.0 standard drinks   Drug use: No    Home Medications Prior to Admission medications   Medication Sig Start Date End Date Taking? Authorizing Provider  ranolazine (RANEXA) 500 MG 12 hr tablet Take 1 tablet (500 mg total) by mouth 2 (two) times daily. 09/09/20  Yes Alba Kriesel, PA-C  acetaminophen (TYLENOL) 500 MG tablet Take 500 mg by mouth every 6 (six) hours as needed for mild pain.     [provider]   albuterol (PROVENTIL) (2.5 MG/3ML) 0.083% nebulizer solution Take 3 mLs (2.5 mg total) by nebulization every 6 (six) hours as needed for wheezing or shortness of breath. 06/24/20   Muthersbaugh, Jarrett Soho, PA-C  albuterol (VENTOLIN HFA) 108 (90 Base) MCG/ACT inhaler Inhale 2 puffs into the lungs every 6 (six) hours as needed for wheezing or shortness of breath. 11/04/19   [name], MD  budesonide-formoterol (SYMBICORT) 80-4.5 MCG/ACT inhaler Inhale 2 puffs into the lungs 2 (two) times daily. 11/04/19   [name], MD  Calcium-Magnesium-Zinc (CAL-MAG-ZINC PO) Take 1 tablet by mouth daily.    [provider]  cetirizine (ZYRTEC) 10 MG tablet TAKE 1 TABLET(10 MG) BY MOUTH DAILY 08/18/20   [name], MD  cholecalciferol (VITAMIN D) 1000 UNITS tablet Take 1,000 Units by mouth every morning.     [provider]  clopidogrel (PLAVIX) 75 MG tablet TAKE 1 TABLET(75 MG) BY MOUTH DAILY 08/08/20   [name], MD  famotidine (PEPCID) 40 MG tablet TAKE 1 TABLET(40 MG) BY MOUTH DAILY AS NEEDED FOR HEARTBURN OR INDIGESTION 07/14/20   [name], MD  glucose blood test strip Use as instructed 03/29/20   [name], MD  hydroxychloroquine (PLAQUENIL) 200 MG tablet Take 400 mg by mouth daily.     [provider]  isosorbide mononitrate (IMDUR) 30 MG 24 hr tablet Take 3 tablets (90 mg total) by mouth daily. 08/16/20  Freada Bergeron, MD  Lancets [facility] ULTRASOFT) lancets Use as instructed 03/29/20   [name], MD  losartan (COZAAR) 25 MG tablet Take 25 mg by mouth daily. 05/13/20   [provider]  nebivolol (BYSTOLIC) 10 MG tablet TAKE 1 TABLET(10 MG) BY MOUTH DAILY 09/05/20   Freada Bergeron, MD  Polyethyl Glycol-Propyl Glycol (SYSTANE) 0.4-0.3 % GEL ophthalmic gel Place 1 application into both eyes daily as needed (for dry eyes).     [provider]  Potassium 99 MG TABS Take 99 mg by mouth daily.    [provider]  pregabalin  (LYRICA) 75 MG capsule TAKE 1 CAPSULE(75 MG) BY MOUTH AT BEDTIME 08/04/20   Burns, Claudina Lick, MD  REPATHA SURECLICK [id number] MG/ML SOAJ INJECT 1 PEN UNDER THE SKIN EVERY 14 DAYS 03/07/20   [name], MD  tiZANidine (ZANAFLEX) 4 MG tablet Take 1 tablet (4 mg total) by mouth 2 (two) times daily as needed for muscle spasms. 02/12/20   Marybelle Killings, MD  torsemide (DEMADEX) 20 MG tablet TAKE 1 TABLET(20 MG) BY MOUTH DAILY 09/29/19   [name], MD    Allergies    Bactrim [sulfamethoxazole-trimethoprim], Cefuroxime axetil, Oxycodone, Pravastatin, Seldane [terfenadine], Zocor [simvastatin], Tramadol, Atorvastatin, Latex, Lime flavor [flavoring agent], Metformin and related, Rosuvastatin, and Tape  Review of Systems   Review of Systems  Constitutional:  Negative for appetite change, chills and fever.  HENT:  Negative for ear pain, rhinorrhea, sneezing and sore throat.   Eyes:  Negative for photophobia and visual disturbance.  Respiratory:  Positive for shortness of breath. Negative for cough, chest tightness and wheezing.   Cardiovascular:  Positive for chest pain. Negative for palpitations.  Gastrointestinal:  Negative for abdominal pain, blood in stool, constipation, diarrhea, nausea and vomiting.  Genitourinary:  Negative for dysuria, hematuria and urgency.  Musculoskeletal:  Negative for myalgias.  Skin:  Negative for rash.  Neurological:  Positive for dizziness and headaches. Negative for weakness and light-headedness.   Physical Exam Updated Vital Signs BP (!) 157/80   Pulse 61   Temp (!) 97.4 F (36.3 C) (Oral)   Resp 14   SpO2 97%   Physical Exam Vitals and nursing note reviewed.  Constitutional:      General: She is not in acute distress.    Appearance: She is well-developed.  HENT:     Head: Normocephalic and atraumatic.     Nose: Nose normal.  Eyes:     General: No scleral icterus.       Right eye: No discharge.        Left eye: No discharge.     Conjunctiva/sclera:  Conjunctivae normal.     Pupils: Pupils are equal, round, and reactive to light.  Cardiovascular:     Rate and Rhythm: Normal rate and regular rhythm.     Heart sounds: Normal heart sounds. No murmur heard.   No friction rub. No gallop.  Pulmonary:     Effort: Pulmonary effort is normal. No respiratory distress.     Breath sounds: Normal breath sounds.  Abdominal:     General: Bowel sounds are normal. There is no distension.     Palpations: Abdomen is soft.     Tenderness: There is no abdominal tenderness. There is no guarding.  Musculoskeletal:        General: Normal range of motion.     Cervical back: Normal range of motion and neck supple.  Skin:    General: Skin  is warm and dry.     Findings: No rash.  Neurological:     Mental Status: She is alert and oriented to person, place, and time.     Cranial Nerves: No cranial nerve deficit.     Motor: No weakness or abnormal muscle tone.     Coordination: Coordination normal.     Comments: Pupils reactive. No facial asymmetry noted. Cranial nerves appear grossly intact. Sensation intact to light touch on face, BUE and BLE. Strength 5/5 in BUE and BLE.     ED Results / Procedures / Treatments   Labs (all labs ordered are listed, but only abnormal results are displayed) Labs Reviewed  CBC WITH DIFFERENTIAL/PLATELET - Abnormal; Notable for the following components:      Result Value   WBC 3.8 (*)    Platelets 128 (*)    All other components within normal limits  COMPREHENSIVE METABOLIC PANEL - Abnormal; Notable for the following components:   Potassium 3.4 (*)    Glucose, Bld 147 (*)    Creatinine, Ser 1.04 (*)    GFR, Estimated 55 (*)    All other components within normal limits  BRAIN NATRIURETIC PEPTIDE  TROPONIN I (HIGH SENSITIVITY)  TROPONIN I (HIGH SENSITIVITY)    EKG EKG Interpretation  Date/Time:  Friday September 09 2020 13:36:40 EDT Ventricular Rate:  55 PR Interval:  240 QRS Duration: 124 QT Interval:  505 QTC  Calculation: 484 R Axis:   -16 Text Interpretation: Sinus rhythm Prolonged PR interval Probable left ventricular hypertrophy Confirmed by Nanda Quinton [phone]) on 09/09/2020 1:44:23 PM  Radiology DG Chest 2 View  Result Date: 09/09/2020 CLINICAL DATA:  Chest pain, shortness of breath, chest heaviness, dizziness, nausea, and vomiting since this morning, history CHF, hypertension EXAM: CHEST - 2 VIEW COMPARISON:  06/24/2020 FINDINGS: Enlargement of cardiac silhouette. Tortuous aorta. Mediastinal contours and pulmonary vascularity otherwise normal. Lungs clear. No pulmonary infiltrate, pleural effusion, or pneumothorax. Prior cervical spine fusion. Levoconvex thoracic scoliosis with multilevel degenerative disc disease changes thoracic spine. IMPRESSION: Enlargement of cardiac silhouette. No acute abnormalities. Electronically Signed   By: Lavonia Dana M.D.   On: 09/09/2020 14:47   CT HEAD WO CONTRAST (5MM)  Result Date: 09/09/2020 CLINICAL DATA:  Transient ischemic attack, chest discomfort, nausea, vomiting, onset of symptoms 1130 hours today; history coronary artery disease, asthma, CHF, fibromyalgia, hypertension, type II diabetes mellitus, colon cancer EXAM: CT HEAD WITHOUT CONTRAST TECHNIQUE: Contiguous axial images were obtained from the base of the skull through the vertex without intravenous contrast. Sagittal and coronal MPR images reconstructed from axial data set. COMPARISON:  07/22/2017 FINDINGS: Brain: Generalized atrophy. Normal ventricular morphology. No midline shift or mass effect. Mild small vessel chronic ischemic changes of deep cerebral white matter. No intracranial hemorrhage, mass lesion, evidence of acute infarction, or extra-axial fluid collection. Vascular: Atherosclerotic calcification of internal carotid arteries at skull base. No hyperdense vessels. Skull: Intact Sinuses/Orbits: Clear Other: N/A IMPRESSION: Atrophy with small vessel chronic ischemic changes of deep cerebral white  matter. No acute intracranial abnormalities. Electronically Signed   By: Lavonia Dana M.D.   On: 09/09/2020 14:50    Procedures Procedures   Medications Ordered in ED Medications  meclizine (ANTIVERT) tablet 12.5 mg (has no administration in time range)    ED Course  I have reviewed the triage vital signs and the nursing notes.  Pertinent labs & imaging results that were available during my care of the patient were reviewed by me and considered in my medical  decision making (see chart for details).  Clinical Course as of 09/09/20 1549  Fri Sep 09, 2020  1440 Hemoglobin: 13.0 [HK]  1446 Troponin I (High Sensitivity): <2 [HK]  [id number] CT HEAD WO CONTRAST (5MM) No acute findings. [HK]  [id number] DG Chest 2 View No acute findings [HK]  [facility] note in July 2022 states they have considered adding Ranexa to medications if she continues to have chest pain. [HK]  1458 B Natriuretic Peptide: 45.1 [HK]  1532 Ranexa '500mg'$  po BID, f/u in office. Per Dr. Gasper Sells, cardiologist.  [HK]    Clinical Course User Index [HK] Delia Heady, PA-C   MDM Rules/Calculators/A&P                           80 year old female with past medical history of A. fib, CHF, GERD, hypertension presenting to the ED with multiple complaints.  Her nausea began yesterday.  Today had an episode of nonbloody, nonbilious emesis and dizziness after she opened up a magazine with "an odd smell."  States that the dizziness feels similar to her vertigo.  She has had chest pain intermittently for the past few weeks, but not daily but got worse today.  EMS gave nitroglycerin x2 and this improved her pain.  This also began after opening the magazine. Reports some worsening shortness of breath from her baseline due to her reactive airway disease.  No vision changes, numbness in arms or legs or leg swelling.  She has had intermittent headache for the past several weeks.  Cardiologist recently increased her dose of Imdur from 30 mg to  90 mg due to "pounding in my chest."  This was done within the past month.  Does report increased stress related to her family members.  On exam patient without any focal neurological deficits.  No numbness or weakness.  No facial asymmetry.  Abdomen is soft and nontender.  Lungs are clear to auscultation bilaterally.  Oxygen saturations are 100% on room air.  EKG shows sinus rhythm, no STEMI.  Will obtain lab work, CT of head, chest x-ray and reassess.  Troponin is negative.  BNP is normal.  CT of the head and chest x-ray negative for acute findings.  CBC is unremarkable.  Will consult cardiology regarding patient's case to determine whether she needs medication optimization versus observation.   Spoke to Dr. Julieanne Manson on-call for cardiology.  Reviewed patient's case.  States that we can consider starting Ranexa as this was the next step in management of her symptoms.  Although her symptoms do appear atypical she has no contraindications for Ranexa.  They will arrange for follow-up.  They do recommend delta troponin. Care handed off to oncoming provider pending delta troponin and recheck. Will ambulate to evaluate for improvement,    Portions of this note were generated with Dragon dictation software. Dictation errors may occur despite best attempts at proofreading.  Final Clinical Impression(s) / ED Diagnoses Final diagnoses:  Chest wall pain    Rx / DC Orders ED Discharge Orders          Ordered    ranolazine (RANEXA) 500 MG 12 hr tablet  2 times daily        09/09/20 1544             Delia Heady, PA-C 09/09/20 1549    Long, Wonda [facility], MD 09/10/20 [phone]

## 2020-09-09 NOTE — ED Triage Notes (Signed)
Pt arrives via ems from home. PT reports around 1130 she began experiencing chest discomfort, nausea, and vomiting.

## 2020-09-09 NOTE — Discharge Instructions (Addendum)
Your cardiac work-up here was reassuring.  Start the Ranexa 500 mg twice a day and follow-up with your cardiologist.

## 2020-09-12 NOTE — Progress Notes (Signed)
Cardiology Office Note   Date:  09/13/2020   ID:  Samantha, [name] [birth year], MRN [id number]  PCP:  Samantha Rail, MD  Cardiologist:   [name]     Chief Complaint  Patient presents with   Hospitalization Follow-up   ER/[name] follow up    History of Present Illness: Samantha [name] is a 80 y.o. female who presents for post ER visit for chest pain.  She also has Lt neck pain worried about carotid disease.    She has a hx of moderate nonobstructive CAD by cath in [id number], chronic diastolic HF, HTN, HLD, DMII, symptomatic PVCs, reactive airway disease and history of TIA who was previously followed by Dr. Meda [name] who now presents to clinic for follow-up.   Per review of the record, patient underwent cath in 03/2016 which showed 40% mid RCA stenosis and 50% mid LAD stenosis. The patient continued to have intermittent exertional and resting chest pain that has resolved after she was started on Imdur 30 mg daily.   Saw Samantha [name] in 10/2019 prior to surgery for knee replacement. Had myoview 10/2019 which showed no evidence of ischemia or infarction and normal LVEF. TTE 10/2019 with LVEF 60-65%, normal strain, mild dilation of ascending aorta 53m.   Last visit with Dr. [name] described some intermittent chest pressure that is sometimes exertional and other times at rest.  Does respond to imdur  she had also complained of persistent SOB and uses a nebulizer.  Thoughts of adding ranexa were considered.  Her imdur was increased to 90 mg daily  and for complaints of palpitations a zio patch was ordered.  Low PVC burden.  Mostly SR av HR 72.  One run of 15 beats of SVT  for events in diary pt in SR.    Seen in ER 09/09/20 with nausea, vomiting dizziness.  Chest pain. And increased SOB sp02 100% RA  EKG SR without acute changes.  Troponins neg,  BNP normal. Delta troponin is neg.  2V CXR enlargement of cardiac silhouette.  New.   No further chest pain, she is on Ranexa which  she believes has helped.  She also has some room spinning.  K+ was low in ER.   [facility] and improved in 10 min then no further issue.  She has reactive airway disease.  No chest pain with that.   Past Medical History:  Diagnosis Date   A-fib ([name]    Abnormal CT of the chest 2008   last CT4-l 2009:  . No f/u suggested    Allergy    Asthma    CAD (coronary artery disease)    a. Coronary CTA 10/16: Coronary Ca score 211, mod non-obstructive CAD with LM mild plaque (25-50%), mid LAD 50-69%. b. Neg nuc 06/2015.   Cataract    BILATERAL-REMOVED   Chronic diastolic CHF (congestive heart failure) (HCC)    Collagen vascular disease [place]    "arterial sclerosis" per pt   Complication of anesthesia    trouble waking up   Fibromyalgia    GERD (gastroesophageal reflux disease)    H/O hiatal hernia    Heart murmur    Hyperlipidemia    Hypertension    [place]alignant [place] of [place] colon [place]   Minimally invasive right hemicolectomy to be done    Neuromuscular disorder (HCC)    FIBROMYALGIA   Ocular migraine  OSA (obstructive sleep apnea) 09/2007   dx w/ a sleep study, not on  CPAP   Osteoarthritis    Osteoporosis    Pneumonia    "double" in 2004   PONV (postoperative nausea and vomiting)    Reactive airway disease 01/29/2002   dx of pseudoasthma / vcd in 2005 and nl sprirometry History of dyspnea, 2011,  improved after several medications were changed around Question of COPD, disproved July 06, 2009 with nl pft's       Rheumatoid factor positive    Shingles 11/2009   Sleep apnea    Stroke (Samantha [name])    TIA (transient ischemic attack)    x2 - on Plavix for this   Torn rotator cuff    right worse than left, both are torn   Tumor, 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 Pernell Dupre (done after colon surgery)   CATARACT EXTRACTION W/ INTRAOCULAR LENS  IMPLANT, BILATERAL  2012   COLON SURGERY  10/2014   EYE SURGERY Bilateral    torq lens for cataracts   LEFT HEART CATH AND CORONARY ANGIOGRAPHY N/A 03/22/2016   Procedure: Left Heart Cath and Coronary Angiography;  Surgeon: Samantha Crome, MD;  Location: [facility];  Service: Cardiovascular;  Laterality: N/A;   LUMBAR LAMINECTOMY/DECOMPRESSION MICRODISCECTOMY N/A 01/20/2018   Procedure: L4-5 decompression;  Surgeon: Samantha Killings, MD;  Location: [facility];  Service: Orthopedics;  Laterality: N/A;   THYROIDECTOMY, PARTIAL  1960's   TOTAL KNEE ARTHROPLASTY Right 12/04/2019   Procedure: RIGHT TOTAL KNEE ARTHROPLASTY;  Surgeon: Samantha Killings, MD;  Location: [facility];  Service: Orthopedics;  Laterality: Right;  [place]   "Laser surgery for vaginal cancer; followed by chemotherapy" (06/27/2012)     Current Outpatient Medications  Medication Sig Dispense Refill   acetaminophen (TYLENOL) 500 MG tablet Take 500 mg by mouth every 6 (six) hours as needed for mild pain.      albuterol (PROVENTIL) (2.5 MG/3ML) 0.083% nebulizer solution Take 3 mLs (2.5 mg total) by nebulization every 6 (six) hours as needed for wheezing or shortness of breath. 150 mL 0   albuterol (VENTOLIN HFA) 108 (90 Base) MCG/ACT inhaler Inhale 2 puffs into the lungs every 6 (six) hours as needed for wheezing or shortness of breath. 18 g 11   budesonide-formoterol (SYMBICORT) 80-4.5 MCG/ACT inhaler Inhale 2 puffs into the lungs 2 (two) times daily. 6.9 g 11   Calcium-Magnesium-Zinc (CAL-MAG-ZINC PO) Take 1 tablet by mouth daily.     cetirizine  (ZYRTEC) 10 MG tablet TAKE 1 TABLET(10 MG) BY MOUTH DAILY 90 tablet 3   cholecalciferol (VITAMIN D) 1000 UNITS tablet Take 1,000 Units by mouth every morning.      clopidogrel (PLAVIX) 75 MG tablet TAKE 1 TABLET(75 MG) BY MOUTH DAILY 90 tablet 1   famotidine (PEPCID) 40 MG tablet TAKE 1 TABLET(40 MG) BY MOUTH DAILY AS NEEDED FOR HEARTBURN OR INDIGESTION 30 tablet 5  glucose blood test strip Use as instructed 100 each 12   hydroxychloroquine (PLAQUENIL) 200 MG tablet Take 400 mg by mouth daily.      isosorbide mononitrate (IMDUR) 30 MG 24 hr tablet Take 3 tablets (90 mg total) by mouth daily. 270 tablet 1   Lancets (ONETOUCH ULTRASOFT) lancets Use as instructed 100 each 12   losartan (COZAAR) 25 MG tablet Take 25 mg by mouth daily.     meclizine (ANTIVERT) 12.5 MG tablet Take 1 tablet (12.5 mg total) by mouth 3 (three) times daily as needed for dizziness. 30 tablet 0   nebivolol (BYSTOLIC) 10 MG tablet TAKE 1 TABLET(10 MG) BY MOUTH DAILY 90 tablet 2   nitroGLYCERIN (NITROSTAT) 0.4 MG SL tablet Place 1 tablet (0.4 mg total) under the tongue every 5 (five) minutes as needed for chest pain. 25 tablet 3   Polyethyl Glycol-Propyl Glycol (SYSTANE) 0.4-0.3 % GEL ophthalmic gel Place 1 application into both eyes daily as needed (for dry eyes).      pregabalin (LYRICA) 75 MG capsule TAKE 1 CAPSULE(75 MG) BY MOUTH AT BEDTIME 30 capsule 0   psyllium (METAMUCIL) 58.6 % powder Take 1 packet by mouth daily.     ranolazine (RANEXA) 500 MG 12 hr tablet Take 1 tablet (500 mg total) by mouth 2 (two) times daily. 30 tablet 0   REPATHA SURECLICK [id number] MG/ML SOAJ INJECT 1 PEN UNDER THE SKIN EVERY 14 DAYS 6 mL 3   tiZANidine (ZANAFLEX) 4 MG tablet Take 1 tablet (4 mg total) by mouth 2 (two) times daily as needed for muscle spasms. 40 tablet 0   torsemide (DEMADEX) 20 MG tablet TAKE 1 TABLET(20 MG) BY MOUTH DAILY 90 tablet 3   No current facility-administered medications for this visit.    Allergies:   Bactrim  [sulfamethoxazole-trimethoprim], Cefuroxime axetil, Oxycodone, Pravastatin, Seldane [terfenadine], Zocor [simvastatin], Tramadol, Atorvastatin, Cefuroxime, Latex, Lime flavor [flavoring agent], Metformin and related, Oxycodone hcl, Pravastatin sodium, Rosuvastatin, Rosuvastatin calcium, and Tape    Social History:  The patient  reports that she quit smoking about 22 years ago. Her smoking use included cigarettes. She has a 1.25 pack-year smoking history. She has never used smokeless tobacco. She reports that she does not drink alcohol and does not use drugs.   Family History:  The patient's family history includes Allergies in her sister; Aneurysm in her brother; Asthma in her paternal grandmother and sister; Diabetes in her brother and brother; Emphysema in her brother; Heart disease in her brother, father, mother, and another family member; Kidney failure in her brother; Lung cancer in her mother; Parkinsonism in her sister; Stroke in her brother, maternal grandmother, and paternal grandmother.    ROS:  General:no colds or fevers, no weight changes Skin:no rashes or ulcers HEENT:no blurred vision, no congestion CV:see HPI PUL:see HPI GI:no diarrhea constipation or melena, no indigestion GU:no hematuria, no dysuria MS:no joint pain, no claudication Neuro:no syncope, no lightheadedness Endo:no diabetes, no thyroid disease  Wt Readings from Last 3 Encounters:  09/13/20 199 lb 9.6 oz (90.5 kg)  08/16/20 199 lb 3.2 oz (90.4 kg)  07/12/20 199 lb (90.3 kg)     PHYSICAL EXAM: VS:  BP 112/60   Pulse 63   Ht '5\' 3"'$  (1.6 m)   Wt 199 lb 9.6 oz (90.5 kg)   SpO2 97%   BMI 35.36 kg/m  , BMI Body mass index is 35.36 kg/m. General:Pleasant affect, NAD Skin:Warm and dry, brisk capillary refill HEENT:normocephalic, sclera clear, mucus membranes moist Neck:supple,  no JVD, no bruits  Heart:S1S2 RRR with soft systolic murmur, no gallup, rub or click Lungs:clear without rales, rhonchi, or  wheezes [id number], non tender, + BS, do not palpate liver spleen or masses Ext:no lower ext edema, 2+ pedal pulses, 2+ radial pulses Neuro:alert and oriented X 3, MAE, follows commands, + facial symmetry    EKG:  EKG is ordered today. The ekg ordered today demonstrates SR 63, prob LVH, 1st degree AV block but stable EKG   Recent Labs: 09/09/2020: ALT 15; B Natriuretic Peptide 45.1; BUN 14; Creatinine, Ser 1.04; Hemoglobin 13.0; Platelets 128; Potassium 3.4; Sodium 137    Lipid Panel    Component Value Date/Time   CHOL 131 04/14/2020 1207   CHOL 135 10/01/2018 1036   TRIG 87.0 04/14/2020 1207   HDL 82.90 04/14/2020 1207   HDL 84 10/01/2018 1036   CHOLHDL 2 04/14/2020 1207   VLDL 17.4 04/14/2020 1207   LDLCALC 30 04/14/2020 1207   LDLCALC 34 10/01/2018 1036   LDLDIRECT 126.2 12/07/2010 1039       Other studies Reviewed: Additional studies/ records that were reviewed today include: . Study Highlights    Patient had a minimum heart rate of 53 bpm, maximum heart rate of 141 bpm, and average heart rate of 72 bpm. Predominant underlying rhythm was sinus rhythm. One run of supraventricular tachycardia occurred lasting 15 beats at longest with a max rate of 141 bpm at fastest. Isolated PACs were rare (<1.0%) Isolated PVCs were rare (<1.0%). First degree heart block noted. Triggered and diary events associated with sinus rhythm.   No malignant arrhythmias.   Myoview 10/2019: Nuclear stress EF: 63%. There was no ST segment deviation noted during stress. No T wave inversion was noted during stress. The study is normal. This is a low risk study. The left ventricular ejection fraction is normal (55-65%).   1. Normal study without ischemia or infarction. 2. Normal LVEF, 63%. 3. This is a low-risk study.   TTE 11/13/19: IMPRESSIONS     1. Left ventricular ejection fraction, by estimation, is 60 to 65%. The  left ventricle has normal function. The left ventricle has no  regional  wall motion abnormalities. There is mild left ventricular hypertrophy.  Left ventricular diastolic parameters  are consistent with Grade I diastolic dysfunction (impaired relaxation).  GLS -21.1%.   2. Right ventricular systolic function is normal. The right ventricular  size is normal. Tricuspid regurgitation signal is inadequate for assessing  PA pressure.   3. The aortic valve is tricuspid. Aortic valve regurgitation is not  visualized. Mild aortic valve sclerosis is present, with no evidence of  aortic valve stenosis.   4. Aortic dilatation noted. There is mild dilatation of the ascending  aorta, measuring 38 mm.   5. The mitral valve is normal in structure. Trivial mitral valve  regurgitation. No evidence of mitral stenosis.   6. The inferior vena cava is normal in size with greater than 50%  respiratory variability, suggesting right atrial pressure of 3 mmHg.   Comparison(s): 10/20/14 EF 60-65%.    [place] 2018: Widely patent coronary arteries with 30-50% mid LAD narrowing and 40% mid RCA narrowing. Coronary arteries are tortuous. Right dominant coronary anatomy. LAD wraps around the left ventricular apex. Normal LV function. Normal LVEDP. EF 60%. Suspect chest discomfort is not ischemic.   ASSESSMENT AND PLAN:  1.  CAD non obstructive on cath 2018 seen recently in ER for angina neg troponin, Ranexa was started and no further chest pain.  Continue ranexa and imdur.  Follow up in 4- 6 weeks to see how she is doing.  On plavix, losartan, bystolic  2.  Neck pain and concern for carotid disease wit dizziness. Will check carotid dopplers.  3.  HLD on statin and repatha continue   4.  Palpitations on bystolic continue,  on monitor low PVC burden continue BB.  5.   Hx of TIA plavix 75 daily.    6.  Reactive airway disease one episode but no chest pain.    7. Chronic diastolic HF euvolemic today.    Current medicines are reviewed with the patient today.  The patient Has  no concerns regarding medicines.  The following changes have been made:  See above Labs/ tests ordered today include:see above  Disposition:   FU:  see above  Signed, Cecilie Kicks, NP  09/13/2020 4:21 PM    [facility] [place], [address] [place], [place] Phone: [phone]; Fax: [phone]

## 2020-09-13 ENCOUNTER — Encounter: Payer: Self-pay | Admitting: Cardiology

## 2020-09-13 ENCOUNTER — Telehealth: Payer: Self-pay | Admitting: *Deleted

## 2020-09-13 ENCOUNTER — Ambulatory Visit (INDEPENDENT_AMBULATORY_CARE_PROVIDER_SITE_OTHER): Payer: Medicare Other | Admitting: Cardiology

## 2020-09-13 ENCOUNTER — Other Ambulatory Visit: Payer: Self-pay

## 2020-09-13 VITALS — BP 112/60 | HR 63 | Ht 63.0 in | Wt 199.6 lb

## 2020-09-13 DIAGNOSIS — I2510 Atherosclerotic heart disease of native coronary artery without angina pectoris: Secondary | ICD-10-CM | POA: Diagnosis not present

## 2020-09-13 DIAGNOSIS — R002 Palpitations: Secondary | ICD-10-CM

## 2020-09-13 DIAGNOSIS — I5032 Chronic diastolic (congestive) heart failure: Secondary | ICD-10-CM

## 2020-09-13 DIAGNOSIS — I517 Cardiomegaly: Secondary | ICD-10-CM

## 2020-09-13 DIAGNOSIS — I2583 Coronary atherosclerosis due to lipid rich plaque: Secondary | ICD-10-CM

## 2020-09-13 DIAGNOSIS — R079 Chest pain, unspecified: Secondary | ICD-10-CM

## 2020-09-13 DIAGNOSIS — E785 Hyperlipidemia, unspecified: Secondary | ICD-10-CM

## 2020-09-13 DIAGNOSIS — I10 Essential (primary) hypertension: Secondary | ICD-10-CM

## 2020-09-13 DIAGNOSIS — R7989 Other specified abnormal findings of blood chemistry: Secondary | ICD-10-CM

## 2020-09-13 DIAGNOSIS — R42 Dizziness and giddiness: Secondary | ICD-10-CM

## 2020-09-13 LAB — BASIC METABOLIC PANEL
BUN/Creatinine Ratio: 17 (ref 12–28)
BUN: 20 mg/dL (ref 8–27)
CO2: 26 mmol/L (ref 20–29)
Calcium: 9.5 mg/dL (ref 8.7–10.3)
Chloride: 98 mmol/L (ref 96–106)
Creatinine, Ser: 1.21 mg/dL — ABNORMAL HIGH (ref 0.57–1.00)
Glucose: 105 mg/dL — ABNORMAL HIGH (ref 65–99)
Potassium: 4.4 mmol/L (ref 3.5–5.2)
Sodium: 140 mmol/L (ref 134–144)
eGFR: 46 mL/min/{1.73_m2} — ABNORMAL LOW (ref >59)

## 2020-09-13 MED ORDER — NITROGLYCERIN 0.4 MG SL SUBL: 0.4000 mg | SUBLINGUAL_TABLET | SUBLINGUAL | 3 refills | Status: DC | PRN

## 2020-09-13 MED ORDER — TORSEMIDE 20 MG PO TABS: ORAL_TABLET | ORAL | 3 refills | Status: DC

## 2020-09-13 NOTE — Telephone Encounter (Signed)
Patient notified. She will have lab work done on 8/23 when in office for echo

## 2020-09-13 NOTE — Telephone Encounter (Signed)
-----   Message from Isaiah Serge, NP sent at 09/13/2020  5:07 PM EDT ----- Have pt take torsemide every other day.  Her kidney function is up some repeat BMP next week.

## 2020-09-13 NOTE — Patient Instructions (Signed)
Medication Instructions:  Your physician recommends that you continue on your current medications as directed. Please refer to the Current Medication list given to you today.  *If you need a refill on your cardiac medications before your next appointment, please call your pharmacy*   Lab Work: TODAY:  BMET  If you have labs (blood work) drawn today and your tests are completely normal, you will receive your results only by: [place] (if you have MyChart) OR A paper copy in the mail If you have any lab test that is abnormal or we need to change your treatment, we will call you to review the results.   Testing/Procedures: Your physician has requested that you have an echocardiogram. Echocardiography is a painless test that uses sound waves to create images of your heart. It provides your doctor with information about the size and shape of your heart and how well your heart's chambers and valves are working. This procedure takes approximately one hour. There are no restrictions for this procedure.   Your physician has requested that you have a carotid duplex. This test is an ultrasound of the carotid arteries in your neck. It looks at blood flow through these arteries that supply the brain with blood. Allow one hour for this exam. There are no restrictions or special instructions.    Follow-Up: At [facility], you and your health needs are our priority.  As part of our continuing mission to provide you with exceptional heart care, we have created designated Provider Care Teams.  These Care Teams include your primary Cardiologist (physician) and Advanced Practice Providers (APPs -  Physician Assistants and Nurse Practitioners) who all work together to provide you with the care you need, when you need it.  We recommend signing up for the patient portal called "MyChart".  Sign up information is provided on this After Visit Summary.  MyChart is used to connect with patients for Virtual  Visits (Telemedicine).  Patients are able to view lab/test results, encounter notes, upcoming appointments, etc.  Non-urgent messages can be sent to your provider as well.   To learn more about what you can do with MyChart, go to [email or web address].    Your next appointment:   1 month(s)  The format for your next appointment:   In Person  Provider:   You may see Gwyndolyn Kaufman, MD, or one of the following Advanced Practice Providers on your designated Care Team:   Richardson Dopp, PA-C Robbie Lis, [place]   Other Instructions

## 2020-09-14 ENCOUNTER — Other Ambulatory Visit: Payer: Self-pay | Admitting: Internal Medicine

## 2020-09-16 ENCOUNTER — Ambulatory Visit (HOSPITAL_COMMUNITY)
Admission: RE | Admit: 2020-09-16 | Discharge: 2020-09-16 | Disposition: A | Discharge status: Home / Self Care | Payer: Medicare Other | Source: Ambulatory Visit | Attending: Cardiovascular Disease | Admitting: Cardiovascular Disease

## 2020-09-16 ENCOUNTER — Other Ambulatory Visit: Payer: Self-pay

## 2020-09-16 DIAGNOSIS — R079 Chest pain, unspecified: Secondary | ICD-10-CM | POA: Diagnosis not present

## 2020-09-16 DIAGNOSIS — R42 Dizziness and giddiness: Secondary | ICD-10-CM | POA: Diagnosis not present

## 2020-09-16 DIAGNOSIS — R002 Palpitations: Secondary | ICD-10-CM | POA: Insufficient documentation

## 2020-09-16 DIAGNOSIS — I517 Cardiomegaly: Secondary | ICD-10-CM | POA: Insufficient documentation

## 2020-09-16 DIAGNOSIS — I2510 Atherosclerotic heart disease of native coronary artery without angina pectoris: Secondary | ICD-10-CM | POA: Insufficient documentation

## 2020-09-16 DIAGNOSIS — I5032 Chronic diastolic (congestive) heart failure: Secondary | ICD-10-CM | POA: Diagnosis not present

## 2020-09-16 DIAGNOSIS — I10 Essential (primary) hypertension: Secondary | ICD-10-CM | POA: Diagnosis not present

## 2020-09-16 DIAGNOSIS — E785 Hyperlipidemia, unspecified: Secondary | ICD-10-CM | POA: Insufficient documentation

## 2020-09-19 ENCOUNTER — Telehealth: Payer: Self-pay | Admitting: Physician Assistant

## 2020-09-19 NOTE — Telephone Encounter (Signed)
Pt is returning a call  

## 2020-09-19 NOTE — Telephone Encounter (Signed)
Informed pt of carotid results. Pt verbalized understanding.  

## 2020-09-20 ENCOUNTER — Other Ambulatory Visit: Payer: Self-pay | Admitting: *Deleted

## 2020-09-20 ENCOUNTER — Ambulatory Visit (HOSPITAL_COMMUNITY): Payer: Medicare Other | Attending: Cardiology

## 2020-09-20 ENCOUNTER — Other Ambulatory Visit: Payer: Self-pay

## 2020-09-20 ENCOUNTER — Other Ambulatory Visit: Payer: Medicare Other

## 2020-09-20 DIAGNOSIS — I517 Cardiomegaly: Secondary | ICD-10-CM | POA: Diagnosis not present

## 2020-09-20 DIAGNOSIS — R7989 Other specified abnormal findings of blood chemistry: Secondary | ICD-10-CM

## 2020-09-20 DIAGNOSIS — E785 Hyperlipidemia, unspecified: Secondary | ICD-10-CM | POA: Diagnosis not present

## 2020-09-20 DIAGNOSIS — R42 Dizziness and giddiness: Secondary | ICD-10-CM | POA: Diagnosis not present

## 2020-09-20 DIAGNOSIS — R079 Chest pain, unspecified: Secondary | ICD-10-CM | POA: Insufficient documentation

## 2020-09-20 DIAGNOSIS — I2510 Atherosclerotic heart disease of native coronary artery without angina pectoris: Secondary | ICD-10-CM | POA: Insufficient documentation

## 2020-09-20 DIAGNOSIS — I10 Essential (primary) hypertension: Secondary | ICD-10-CM | POA: Diagnosis not present

## 2020-09-20 DIAGNOSIS — R002 Palpitations: Secondary | ICD-10-CM | POA: Diagnosis not present

## 2020-09-20 DIAGNOSIS — I5032 Chronic diastolic (congestive) heart failure: Secondary | ICD-10-CM | POA: Diagnosis not present

## 2020-09-20 LAB — ECHOCARDIOGRAM COMPLETE
Area-P 1/2: 2.45 cm2
Calc EF: 66.4 %
S' Lateral: 2.2 cm
Single Plane A2C EF: 66.9 %
Single Plane A4C EF: 63.6 %

## 2020-09-20 MED ORDER — TORSEMIDE 20 MG PO TABS: ORAL_TABLET | ORAL | 3 refills | Status: DC

## 2020-09-21 LAB — BASIC METABOLIC PANEL
BUN/Creatinine Ratio: 25 (ref 12–28)
BUN: 26 mg/dL (ref 8–27)
CO2: 28 mmol/L (ref 20–29)
Calcium: 9.7 mg/dL (ref 8.7–10.3)
Chloride: 102 mmol/L (ref 96–106)
Creatinine, Ser: 1.04 mg/dL — ABNORMAL HIGH (ref 0.57–1.00)
Glucose: 89 mg/dL (ref 65–99)
Potassium: 4.2 mmol/L (ref 3.5–5.2)
Sodium: 141 mmol/L (ref 134–144)
eGFR: 55 mL/min/{1.73_m2} — ABNORMAL LOW (ref >59)

## 2020-09-22 NOTE — Progress Notes (Signed)
Pt has been made aware of normal result and verbalized understanding.  jw

## 2020-09-26 DIAGNOSIS — E669 Obesity, unspecified: Secondary | ICD-10-CM | POA: Diagnosis not present

## 2020-09-26 DIAGNOSIS — M1990 Unspecified osteoarthritis, unspecified site: Secondary | ICD-10-CM | POA: Diagnosis not present

## 2020-09-26 DIAGNOSIS — M359 Systemic involvement of connective tissue, unspecified: Secondary | ICD-10-CM | POA: Diagnosis not present

## 2020-09-26 DIAGNOSIS — I10 Essential (primary) hypertension: Secondary | ICD-10-CM | POA: Diagnosis not present

## 2020-09-26 DIAGNOSIS — E785 Hyperlipidemia, unspecified: Secondary | ICD-10-CM | POA: Diagnosis not present

## 2020-09-26 DIAGNOSIS — M069 Rheumatoid arthritis, unspecified: Secondary | ICD-10-CM | POA: Diagnosis not present

## 2020-09-26 DIAGNOSIS — Z79899 Other long term (current) drug therapy: Secondary | ICD-10-CM | POA: Diagnosis not present

## 2020-09-26 DIAGNOSIS — Z8739 Personal history of other diseases of the musculoskeletal system and connective tissue: Secondary | ICD-10-CM | POA: Diagnosis not present

## 2020-09-26 DIAGNOSIS — M797 Fibromyalgia: Secondary | ICD-10-CM | POA: Diagnosis not present

## 2020-09-26 DIAGNOSIS — I7300 Raynaud's syndrome without gangrene: Secondary | ICD-10-CM | POA: Diagnosis not present

## 2020-10-09 ENCOUNTER — Other Ambulatory Visit: Payer: Self-pay | Admitting: Cardiology

## 2020-10-19 ENCOUNTER — Encounter: Payer: Self-pay | Admitting: Internal Medicine

## 2020-10-19 ENCOUNTER — Other Ambulatory Visit: Payer: Self-pay | Admitting: Internal Medicine

## 2020-10-19 ENCOUNTER — Other Ambulatory Visit: Payer: Self-pay

## 2020-10-19 DIAGNOSIS — G629 Polyneuropathy, unspecified: Secondary | ICD-10-CM | POA: Insufficient documentation

## 2020-10-19 NOTE — Progress Notes (Signed)
Subjective:    Patient ID: Samantha [name], female    DOB: 1940-11-05, 80 y.o.   MRN: [id number]  This visit occurred during the SARS-CoV-2 public health emergency.  Safety protocols were in place, including screening questions prior to the visit, additional usage of staff PPE, and extensive cleaning of exam room while observing appropriate contact time as indicated for disinfecting solutions.     HPI The patient is here for follow up of their chronic medical problems, including DM, htn, hichol, neuropathy, fibromyalgia, Asthma  Went to ED last month for chest pain - likely angina.  Saw cardiology after and has follow up next week.      Medications and allergies reviewed with patient and updated if appropriate.  Patient Active Problem List   Diagnosis Date Noted   Rheumatoid arthritis ([place]) - Dr Dossie Der 10/20/2020   Neuropathy 10/19/2020   Difficulty urinating 04/14/2020   Vitamin D deficiency 04/13/2020   S/P TKR (total knee replacement), 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   Aortic atherosclerosis ([place]) 11/03/2019   Posterior vitreous detachment of right eye 07/15/2019   Posterior vitreous detachment of left eye 07/15/2019   Diabetes mellitus without complication ([place]) 40/98/1191   Recurrent corneal erosion, 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   Raynaud phenomenon 05/06/2019   Trigger finger, left ring finger 03/18/2019   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 01/28/2018   Sacral back pain 10/16/2017   Chronic left upper quadrant pain 07/23/2017   Poor balance 03/14/2017   Right shoulder pain 03/14/2017   Angina pectoris ([place]) 03/22/2016   Hypertensive heart disease 07/07/2015   Essential hypertension 07/07/2015   Lightheadedness 07/07/2015   Plantar fasciitis, left 03/22/2015   Coronary artery disease due to lipid rich plaque 01/05/2015   Chronic diastolic CHF (congestive heart failure), NYHA class 2 ([place]) 01/05/2015   Malignant neoplasm of ascending colon  pT1,  pN0, rM0 s/p robotic colectomy 11/11/2014 11/11/2014   Migraine (Ocular) 01/05/2014   VBI (vertebrobasilar insufficiency) 08/22/2012   TIA (transient ischemic attack) 06/27/2012   DJD (degenerative joint disease) 02/02/2011   Varicose veins of legs 06/06/2010   Palpitations 11/23/2008   UTI'S, RECURRENT 09/28/2008   Fibromyalgia 08/15/2007   DM II (diabetes mellitus, type II), w/ neuropathy 05/21/2006   Hyperlipidemia 05/21/2006   Asthma 01/29/2002    Current Outpatient Medications on File Prior to Visit  Medication Sig Dispense Refill   acetaminophen (TYLENOL) 500 MG tablet Take 500 mg by mouth every 6 (six) hours as needed for mild pain.      albuterol (PROVENTIL) (2.5 MG/3ML) 0.083% nebulizer solution Take 3 mLs (2.5 mg total) by nebulization every 6 (six) hours as needed for wheezing or shortness of breath. 150 mL 0   albuterol (VENTOLIN HFA) 108 (90 Base) MCG/ACT inhaler Inhale 2 puffs into the lungs every 6 (six) hours as needed for wheezing or shortness of breath. 18 g 11   budesonide-formoterol (SYMBICORT) 80-4.5 MCG/ACT inhaler Inhale 2 puffs into the lungs 2 (two) times daily. 6.9 g 11   Calcium-Magnesium-Zinc (CAL-MAG-ZINC PO) Take 1 tablet by mouth daily.     cetirizine (ZYRTEC) 10 MG tablet TAKE 1 TABLET(10 MG) BY MOUTH DAILY 90 tablet 3   cholecalciferol (VITAMIN D) 1000 UNITS tablet Take 1,000 Units by mouth every morning.      clopidogrel (PLAVIX) 75 MG tablet TAKE 1 TABLET(75 MG) BY MOUTH DAILY 90 tablet 1   famotidine (PEPCID) 40 MG tablet TAKE 1 TABLET(40 MG) BY MOUTH DAILY AS NEEDED  FOR HEARTBURN OR INDIGESTION 30 tablet 5   glucose blood test strip Use as instructed 100 each 12   hydroxychloroquine (PLAQUENIL) 200 MG tablet Take 400 mg by mouth daily.      isosorbide mononitrate (IMDUR) 30 MG 24 hr tablet Take 3 tablets (90 mg total) by mouth daily. 270 tablet 1   Lancets (ONETOUCH ULTRASOFT) lancets Use as instructed 100 each 12   losartan (COZAAR) 25 MG tablet Take  25 mg by mouth daily.     meclizine (ANTIVERT) 12.5 MG tablet Take 1 tablet (12.5 mg total) by mouth 3 (three) times daily as needed for dizziness. 30 tablet 0   nebivolol (BYSTOLIC) 10 MG tablet TAKE 1 TABLET(10 MG) BY MOUTH DAILY 90 tablet 2   nitroGLYCERIN (NITROSTAT) 0.4 MG SL tablet PLACE 1 TABLET UNDER THE TONGUE EVERY 5 MINUTES AS NEEDED FOR CHEST PAIN 25 tablet 3   Polyethyl Glycol-Propyl Glycol (SYSTANE) 0.4-0.3 % GEL ophthalmic gel Place 1 application into both eyes daily as needed (for dry eyes).      psyllium (METAMUCIL) 58.6 % powder Take 1 packet by mouth daily.     REPATHA SURECLICK 818 MG/ML SOAJ INJECT 1 PEN UNDER THE SKIN EVERY 14 DAYS 6 mL 3   torsemide (DEMADEX) 20 MG tablet Take one tablet by mouth every other day 45 tablet 3   ranolazine (RANEXA) 500 MG 12 hr tablet Take 1 tablet (500 mg total) by mouth 2 (two) times daily. (Patient not taking: Reported on 10/20/2020) 30 tablet 0   tiZANidine (ZANAFLEX) 4 MG tablet Take 1 tablet (4 mg total) by mouth 2 (two) times daily as needed for muscle spasms. (Patient not taking: Reported on 10/20/2020) 40 tablet 0   No current facility-administered medications on file prior to visit.    Past Medical History:  Diagnosis Date   A-fib [facility])    Abnormal CT of the chest 2008   last CT4-l 2009:  . No f/u suggested    Allergy    Asthma    CAD (coronary artery disease)    a. Coronary CTA 10/16: Coronary Ca score 211, mod non-obstructive CAD with LM mild plaque (25-50%), mid LAD 50-69%. b. Neg nuc 06/2015.   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[place]) 2016   Minimally invasive right hemicolectomy to be  done    Neuromuscular disorder ([place])    FIBROMYALGIA   Ocular migraine    OSA (obstructive sleep apnea) 09/2007   dx w/ a sleep study, not on  CPAP   Osteoarthritis    Osteoporosis    Pneumonia    "double" in 2004   PONV (postoperative nausea and vomiting)    Reactive airway disease 01/29/2002   dx of pseudoasthma / vcd in 2005 and nl sprirometry History of dyspnea, 2011,  improved after several medications were changed around Question of COPD, disproved July 06, 2009 with nl pft's       Rheumatoid factor positive    Shingles 11/2009   Sleep apnea    Stroke [facility])    TIA (transient ischemic attack)  x2 - on Plavix for this   Torn rotator cuff    right worse than left, both are torn   Tumor, 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   Type II diabetes mellitus ([place])    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 Pernell Dupre (done after colon surgery)   CATARACT EXTRACTION W/ INTRAOCULAR LENS  IMPLANT, BILATERAL  2012   COLON SURGERY  10/2014   EYE SURGERY Bilateral    torq lens for cataracts   LEFT HEART CATH AND CORONARY ANGIOGRAPHY N/A 03/22/2016   Procedure: Left Heart Cath and Coronary Angiography;  Surgeon: Belva Crome, MD;  Location: [facility];  Service: Cardiovascular;  Laterality: N/A;   LUMBAR LAMINECTOMY/DECOMPRESSION MICRODISCECTOMY N/A 01/20/2018   Procedure: L4-5 decompression;  Surgeon: Marybelle Killings, MD;  Location: [facility];  Service: Orthopedics;  Laterality: N/A;   THYROIDECTOMY, PARTIAL  1960's   TOTAL KNEE ARTHROPLASTY Right 12/04/2019   Procedure: RIGHT TOTAL KNEE ARTHROPLASTY;  Surgeon: Marybelle Killings, MD;  Location: [facility];  Service: Orthopedics;   Laterality: Right;  RNFA APRIL [place]   "Laser surgery for vaginal cancer; followed by chemotherapy" (06/27/2012)    Social History   Socioeconomic History   Marital status: Married    Spouse name: Ilona Sorrel   Number of children: 2   Years of education: masters   Highest education level: Not on file  Occupational History   Occupation: Retired, disable since 2000    Employer: RETIRED  Tobacco Use   Smoking status: Former    Packs/day: 0.25    Years: 5.00    Pack years: 1.25    Types: Cigarettes    Quit date: 01/29/1998    Years since quitting: 22.7   Smokeless tobacco: Never   Tobacco comments:    Quit in 2001  Vaping Use   Vaping Use: Never used  Substance and Sexual Activity   Alcohol use: No    Alcohol/week: 0.0 standard drinks   Drug use: No   Sexual activity: Never  Other Topics Concern   Not on file  Social History Narrative   On disability since 2000--- also husband has MS   Education. College.   Right handed.   Social Determinants of Health   Financial Resource Strain: Low Risk    Difficulty of Paying Living Expenses: Not hard at all  Food Insecurity: No Food Insecurity   Worried About Charity fundraiser in the Last Year: Never true   [place] in the Last Year: Never true  Transportation Needs: No Transportation Needs   Lack of Transportation (Medical): No   Lack of Transportation (Non-Medical): No  Physical Activity: Not on file  Stress: Not on file  Social Connections: Not on file    Family History  Problem Relation Age of Onset   Heart disease Father    Heart disease Mother    Lung cancer Mother    Allergies Sister    Parkinsonism Sister        possible   Asthma Sister  Asthma Paternal Grandmother    Stroke Paternal Grandmother    Heart disease Other        paternal grandparents, maternal grandparents,    Heart disease Brother    Emphysema Brother    Aneurysm Brother        x3   Kidney failure  Brother    Diabetes Brother    Diabetes Brother    Stroke Brother    Stroke Maternal Grandmother    Breast cancer Neg Hx    Colon cancer Neg Hx    Heart attack Neg Hx    Esophageal cancer Neg Hx    Rectal cancer Neg Hx    Stomach cancer Neg Hx     Review of Systems  Constitutional:  Negative for chills and fever.  Respiratory:  Positive for cough (asthma related), shortness of breath (improved - has it with overdoing it and lifting heavy things) and wheezing (with asthma).   Cardiovascular:  Positive for palpitations. Negative for chest pain and leg swelling.  Gastrointestinal:  Positive for abdominal pain (only occ). Negative for nausea.       No gerd  Musculoskeletal:  Positive for arthralgias and neck pain.  Neurological:  Positive for headaches (cervicogenic). Negative for dizziness (not since ED).      Objective:   Vitals:   10/20/20 1115  BP: 140/82  Pulse: 70  Temp: 98.7 F (37.1 C)  SpO2: 98%   BP Readings from Last 3 Encounters:  10/20/20 140/82  09/13/20 112/60  09/09/20 (!) 173/83   Wt Readings from Last 3 Encounters:  10/20/20 199 lb (90.3 kg)  09/13/20 199 lb 9.6 oz (90.5 kg)  08/16/20 199 lb 3.2 oz (90.4 kg)   Body mass index is 35.25 kg/m.   Physical Exam    Constitutional: Appears well-developed and well-nourished. No distress.  HENT:  Head: Normocephalic and atraumatic.  Neck: Neck supple. No tracheal deviation present. No thyromegaly present.  No cervical lymphadenopathy Cardiovascular: Normal rate, regular rhythm and normal heart sounds.   No murmur heard. No carotid bruit .  No edema Pulmonary/Chest: Effort normal and breath sounds normal. No respiratory distress. No has no wheezes. No rales.  Skin: Skin is warm and dry. Not diaphoretic.  Psychiatric: Normal mood and affect. Behavior is normal.      Assessment & Plan:    See Problem List for Assessment and Plan of chronic medical problems.

## 2020-10-19 NOTE — Patient Instructions (Addendum)
  Blood work was ordered.     Flu immunization administered today.     Medications changes include :   farxiga 10 mg daily  Your prescription(s) have been submitted to your pharmacy. Please take as directed and contact our office if you believe you are having problem(s) with the medication(s).    Please followup in 6 months

## 2020-10-20 ENCOUNTER — Other Ambulatory Visit: Payer: Self-pay | Admitting: Internal Medicine

## 2020-10-20 ENCOUNTER — Ambulatory Visit (INDEPENDENT_AMBULATORY_CARE_PROVIDER_SITE_OTHER): Payer: Medicare Other | Admitting: Internal Medicine

## 2020-10-20 VITALS — BP 140/82 | HR 70 | Temp 98.7°F | Ht 63.0 in | Wt 199.0 lb

## 2020-10-20 DIAGNOSIS — I10 Essential (primary) hypertension: Secondary | ICD-10-CM

## 2020-10-20 DIAGNOSIS — I2510 Atherosclerotic heart disease of native coronary artery without angina pectoris: Secondary | ICD-10-CM

## 2020-10-20 DIAGNOSIS — E1142 Type 2 diabetes mellitus with diabetic polyneuropathy: Secondary | ICD-10-CM | POA: Diagnosis not present

## 2020-10-20 DIAGNOSIS — G629 Polyneuropathy, unspecified: Secondary | ICD-10-CM

## 2020-10-20 DIAGNOSIS — M069 Rheumatoid arthritis, unspecified: Secondary | ICD-10-CM

## 2020-10-20 DIAGNOSIS — Z794 Long term (current) use of insulin: Secondary | ICD-10-CM | POA: Diagnosis not present

## 2020-10-20 DIAGNOSIS — I5032 Chronic diastolic (congestive) heart failure: Secondary | ICD-10-CM

## 2020-10-20 DIAGNOSIS — M797 Fibromyalgia: Secondary | ICD-10-CM | POA: Diagnosis not present

## 2020-10-20 DIAGNOSIS — E782 Mixed hyperlipidemia: Secondary | ICD-10-CM | POA: Diagnosis not present

## 2020-10-20 DIAGNOSIS — I2583 Coronary atherosclerosis due to lipid rich plaque: Secondary | ICD-10-CM

## 2020-10-20 DIAGNOSIS — J4530 Mild persistent asthma, uncomplicated: Secondary | ICD-10-CM | POA: Diagnosis not present

## 2020-10-20 LAB — LIPID PANEL
Cholesterol: 144 mg/dL (ref 0–200)
HDL: 85.4 mg/dL (ref >39.00)
LDL Cholesterol: 43 mg/dL (ref 0–99)
NonHDL: 58.3
Total CHOL/HDL Ratio: 2
Triglycerides: 76 mg/dL (ref 0.0–149.0)
VLDL: 15.2 mg/dL (ref 0.0–40.0)

## 2020-10-20 LAB — COMPREHENSIVE METABOLIC PANEL
ALT: 12 U/L (ref 0–35)
AST: 17 U/L (ref 0–37)
Albumin: 4.4 g/dL (ref 3.5–5.2)
Alkaline Phosphatase: 47 U/L (ref 39–117)
BUN: 23 mg/dL (ref 6–23)
CO2: 30 mEq/L (ref 19–32)
Calcium: 9.4 mg/dL (ref 8.4–10.5)
Chloride: 102 mEq/L (ref 96–112)
Creatinine, Ser: 1.09 mg/dL (ref 0.40–1.20)
GFR: 48.68 mL/min — ABNORMAL LOW (ref >60.00)
Glucose, Bld: 105 mg/dL — ABNORMAL HIGH (ref 70–99)
Potassium: 4.2 mEq/L (ref 3.5–5.1)
Sodium: 140 mEq/L (ref 135–145)
Total Bilirubin: 0.5 mg/dL (ref 0.2–1.2)
Total Protein: 7.3 g/dL (ref 6.0–8.3)

## 2020-10-20 LAB — TSH: TSH: 2.49 u[IU]/mL (ref 0.35–5.50)

## 2020-10-20 LAB — HEMOGLOBIN A1C: Hgb A1c MFr Bld: 6.5 % (ref 4.6–6.5)

## 2020-10-20 MED ORDER — DAPAGLIFLOZIN PROPANEDIOL 10 MG PO TABS: 10.0000 mg | ORAL_TABLET | Freq: Every day | ORAL | 5 refills | Status: DC

## 2020-10-20 MED ORDER — PREGABALIN 75 MG PO CAPS: ORAL_CAPSULE | ORAL | 0 refills | Status: DC

## 2020-10-20 NOTE — Assessment & Plan Note (Signed)
Chronic Controlled Continue Lyrica 75 mg at bedtime

## 2020-10-20 NOTE — Assessment & Plan Note (Signed)
Chronic Euvolemic here today She has decreased her intensity of activity and shortness of breath is improved Following with cardiology Will start Farxiga 10 mg daily

## 2020-10-20 NOTE — Assessment & Plan Note (Signed)
Chronic Somewhat controlled - needs more albuterol at this time of year Continue symbicort bid Using albuterol regularly Sometimes uses neb treatment

## 2020-10-20 NOTE — Assessment & Plan Note (Signed)
Chronic Management per cardiology Did have a hospitalization last month secondary to angina Currently doing well and no chest pain since that time Has follow-up with cardiology next week

## 2020-10-20 NOTE — Assessment & Plan Note (Signed)
Chronic Sugars have been well controlled with diet Will start Farxiga 10 mg daily for sugars, cardiac and renal benefits A1c today, CMP Discussed that if she does become dehydrated we would want to hold this medication

## 2020-10-20 NOTE — Assessment & Plan Note (Signed)
Chronic Controlled currently with hydroxychloroquine Management per Dr. Dossie Der

## 2020-10-20 NOTE — Assessment & Plan Note (Signed)
Chronic Blood pressure well controlled Continue losartan 25 mg daily, Imdur 90 mg daily, Bystolic 10 mg daily CMP

## 2020-10-20 NOTE — Addendum Note (Signed)
Addended by: Boris Lown B on: 10/20/2020 12:02 PM   Modules accepted: Orders

## 2020-10-20 NOTE — Assessment & Plan Note (Signed)
Chronic Currently controlled Continue Lyrica 75 mg at bedtime

## 2020-10-20 NOTE — Assessment & Plan Note (Signed)
Chronic Check lipid panel  Continue repatha Regular exercise and healthy diet encouraged  

## 2020-10-25 NOTE — Progress Notes (Signed)
Cardiology Office Note:    Date:  10/26/2020   ID:  Samantha [name], DOB [birth year], MRN [id number]  PCP:  [name], MD   [facility] Providers Cardiologist:  Freada Bergeron, MD Cardiology APP:  Sharmon Revere     Referring MD: [name], MD   Chief Complaint:  F/u for CHF, CAD, palpitations    Patient Profile:   Samantha [name] is a 80 y.o. female with:  (HFpEF) heart failure with preserved ejection fraction Echocardiogram 8/22 :EF 65-70 Coronary artery disease  Cath 2/18: mild to mod non-obs dz   Myoview 10/21: low risk  Diabetes mellitus  Hypertension  Hyperlipidemia  Fibromyalgia Palpitations 2/2 PVCs Monitor 8/22: 1 brief run of Supraventricular Tachycardia; low burden of PVCs, PACs; NSR Leg edema  Colon CA s/p R hemicolectomy  Aortic atherosclerosis  Hx of TIA, stroke  OSA  Asthma  Prior CV studies: Echocardiogram 09/20/20 EF 65-70, no RWMA, mild LVH, normal RVSF, trivial MR, mild AV calcification, mild AV sclerosis without stenosis, borderline dilation of ascending aorta (38 mm)  Carotid US 09/16/20 Bilateral ICA 1-39   LONG TERM MONITOR (3-7 DAYS) INTERPRETATION 08/29/2020 Narrative  Patient had a minimum heart rate of 53 bpm, maximum heart rate of 141 bpm, and average heart rate of 72 bpm.  Predominant underlying rhythm was sinus rhythm.  One run of supraventricular tachycardia occurred lasting 15 beats at longest with a max rate of 141 bpm at fastest.  Isolated PACs were rare (<1.0%)  Isolated PVCs were rare (<1.0%).  First degree heart block noted.  Triggered and diary events associated with sinus rhythm. No malignant arrhythmias.  GATED SPECT MYO PERF W/LEXISCAN STRESS 1D 11/18/2019 Narrative 1. Normal study without ischemia or infarction. 2. Normal LVEF, 63%. 3. This is a low-risk study.  LEFT HEART CATH AND CORONARY ANGIOGRAPHY 03/22/2016 Narrative  Widely patent coronary arteries with 30-50% mid LAD  narrowing and 40% mid RCA narrowing. Coronary arteries are tortuous.  Right dominant coronary anatomy. LAD wraps around the left ventricular apex.  Normal LV function. Normal LVEDP. EF 60%.  Suspect chest discomfort is not ischemic.  Carotid US 5/14:   < 39% bilateral ICA stenosis.   Holter monitor x 24 hours 07/2012:  NSR with PVCs.     History of Present Illness: Samantha [name] was last seen in clinic by Cecilie Kicks, NP in 8/22.  She had been seen in the ED with chest pain.   Her hs-Trops were completely neg.  She was placed on Ranolazine and had no further chest pain since going to the ED.  A f/u echocardiogram ordered by Mickel Baas showed normal EF.  She also had carotid US with bilat 1-39% stenosis.  She returns for f/u.  She is here alone.  She was not really clear on what the ranolazine was for.  She stopped taking a couple of weeks ago.  Since then, she has had more chest discomfort.  Her chest symptoms are overall stable.  She does have dyspnea exertion.  This is also stable.  She sleeps on 2 pillows.  She has not had syncope or significant leg edema.       Past Medical History:  Diagnosis Date   A-fib [facility])    Abnormal CT of the chest 2008   last CT4-l 2009:  . No f/u suggested    Allergy    Asthma    CAD (coronary artery disease)    a. Coronary CTA 10/16: Coronary Ca  score 211, mod non-obstructive CAD with LM mild plaque (25-50%), mid LAD 50-69%. b. Neg nuc 06/2015.   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[place]) 2016   Minimally invasive right hemicolectomy to be done    Neuromuscular disorder (HCC)    FIBROMYALGIA   Ocular migraine    OSA  (obstructive sleep apnea) 09/2007   dx w/ a sleep study, not on  CPAP   Osteoarthritis    Osteoporosis    Pneumonia    "double" in 2004   PONV (postoperative nausea and vomiting)    Reactive airway disease 01/29/2002   dx of pseudoasthma / vcd in 2005 and nl sprirometry History of dyspnea, 2011,  improved after several medications were changed around Question of COPD, disproved July 06, 2009 with nl pft's       Rheumatoid factor positive    Shingles 11/2009   Sleep apnea    Stroke ([place])    TIA (transient ischemic attack)    x2 - on Plavix for this   Torn rotator cuff    right worse than left, both are torn   Tumor, 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    albuterol (PROVENTIL) (2.5 MG/3ML) 0.083% nebulizer solution Take 3 mLs (2.5 mg total) by nebulization every 6 (six) hours as needed for wheezing or shortness of breath.   albuterol (VENTOLIN HFA) 108 (90 Base) MCG/ACT inhaler Inhale 2 puffs into the lungs every 6 (six) hours as needed for wheezing or shortness of breath.   budesonide-formoterol (SYMBICORT) 80-4.5 MCG/ACT inhaler Inhale 2 puffs into the lungs 2 (two) times daily.   Calcium-Magnesium-Zinc (CAL-MAG-ZINC PO) Take 1 tablet by mouth daily.   cetirizine (ZYRTEC) 10 MG tablet TAKE 1 TABLET(10 MG) BY MOUTH DAILY   cholecalciferol (VITAMIN D) 1000 UNITS tablet Take 1,000 Units by mouth every morning.    clopidogrel (PLAVIX) 75 MG tablet TAKE 1 TABLET(75 MG) BY MOUTH DAILY   dapagliflozin propanediol (FARXIGA) 10 MG TABS tablet Take 1 tablet (10 mg total) by mouth daily before breakfast.   famotidine (PEPCID) 40 MG tablet TAKE 1 TABLET(40 MG) BY MOUTH DAILY AS NEEDED FOR HEARTBURN OR INDIGESTION   glucose blood test strip Use as  instructed   hydroxychloroquine (PLAQUENIL) 200 MG tablet Take 400 mg by mouth daily.    isosorbide mononitrate (IMDUR) 30 MG 24 hr tablet Take 3 tablets (90 mg total) by mouth daily.   Lancets (ONETOUCH ULTRASOFT) lancets Use as instructed   losartan (COZAAR) 25 MG tablet Take 25 mg by mouth daily.   nebivolol (BYSTOLIC) 10 MG tablet TAKE 1 TABLET(10 MG) BY MOUTH DAILY   nitroGLYCERIN (NITROSTAT) 0.4 MG SL tablet PLACE 1 TABLET UNDER THE TONGUE EVERY 5 MINUTES AS NEEDED FOR CHEST PAIN   Polyethyl Glycol-Propyl Glycol (SYSTANE) 0.4-0.3 %  GEL ophthalmic gel Place 1 application into both eyes daily as needed (for dry eyes).    pregabalin (LYRICA) 75 MG capsule TAKE 1 CAPSULE(75 MG) BY MOUTH AT BEDTIME   psyllium (METAMUCIL) 58.6 % powder Take 1 packet by mouth daily.   ranolazine (RANEXA) 500 MG 12 hr tablet Take 1 tablet (500 mg total) by mouth 2 (two) times daily.   REPATHA SURECLICK 024 MG/ML SOAJ INJECT 1 PEN UNDER THE SKIN EVERY 14 DAYS   torsemide (DEMADEX) 20 MG tablet Take one tablet by mouth every other day    Allergies:   Bactrim [sulfamethoxazole-trimethoprim], Cefuroxime axetil, Oxycodone, Pravastatin, Seldane [terfenadine], Zocor [simvastatin], Tramadol, Atorvastatin, Cefuroxime, Latex, Lime flavor [flavoring agent], Metformin and related, Oxycodone hcl, Pravastatin sodium, Rosuvastatin, Rosuvastatin calcium, and Tape   Social History   Tobacco Use   Smoking status: Former    Packs/day: 0.25    Years: 5.00    Pack years: 1.25    Types: Cigarettes    Quit date: 01/29/1998    Years since quitting: 22.7   Smokeless tobacco: Never   Tobacco comments:    Quit in 2001  Vaping Use   Vaping Use: Never used  Substance Use Topics   Alcohol use: No    Alcohol/week: 0.0 standard drinks   Drug use: No    Family Hx: The patient's family history includes Allergies in her sister; Aneurysm in her brother; Asthma in her paternal grandmother and sister; Diabetes in her brother and brother;  Emphysema in her brother; Heart disease in her brother, father, mother, and another family member; Kidney failure in her brother; Lung cancer in her mother; Parkinsonism in her sister; Stroke in her brother, maternal grandmother, and paternal grandmother. There is no history of Breast cancer, Colon cancer, Heart attack, Esophageal cancer, Rectal cancer, or Stomach cancer.  Review of Systems  Gastrointestinal:  Negative for hematochezia.  Genitourinary:  Negative for hematuria.    EKGs/Labs/Other Test Reviewed:    EKG:  EKG is  not ordered today.  The ekg ordered today demonstrates n/a  Recent Labs: 09/09/2020: B Natriuretic Peptide 45.1; Hemoglobin 13.0; Platelets 128 10/20/2020: ALT 12; BUN 23; Creatinine, Ser 1.09; Potassium 4.2; Sodium 140; TSH 2.49   Recent Lipid Panel Lab Results  Component Value Date/Time   CHOL 144 10/20/2020 12:02 PM   CHOL 135 10/01/2018 10:36 AM   TRIG 76.0 10/20/2020 12:02 PM   HDL 85.40 10/20/2020 12:02 PM   HDL 84 10/01/2018 10:36 AM   LDLCALC 43 10/20/2020 12:02 PM   LDLCALC 34 10/01/2018 10:36 AM   LDLDIRECT 126.2 12/07/2010 10:39 AM     Risk Assessment/Calculations:          Physical Exam:    VS:  BP 102/60   Pulse 76   Ht 5\' 3"  (1.6 m)   Wt 200 lb (90.7 kg)   SpO2 97%   BMI 35.43 kg/m     Wt Readings from Last 3 Encounters:  10/26/20 200 lb (90.7 kg)  10/20/20 199 lb (90.3 kg)  09/13/20 199 lb 9.6 oz (90.5 kg)    Constitutional:      Appearance: Healthy appearance. Not in distress.  Neck:     Vascular: JVD normal.  Pulmonary:     Effort: Pulmonary effort is normal.     Breath sounds: No wheezing. No rales.  Cardiovascular:     Normal rate. Regular rhythm. Normal S1. Normal S2.      Murmurs: There is no murmur.  Edema:  Peripheral edema absent.  Abdominal:     Palpations: Abdomen is soft.  Skin:    General: Skin is warm and dry.  Neurological:     Mental Status: Alert and oriented to person, place and time.     Cranial  Nerves: Cranial nerves are intact.       ASSESSMENT & PLAN:   1. Chronic heart failure with preserved ejection fraction (HCC) EF 65-70 by echocardiogram in 8/22.  NYHA IIb-III.  Overall, her volume status appears stable.  She also has asthma which contributes to her shortness of breath.  BNP in 8/22 was normal.  Her weights have been stable since that time.  She has had issues with acute kidney injury in the past.  Continue torsemide 20 mg every other day, dapagliflozin 10 mg daily.  At this point, I do not think that her blood pressure can tolerate spironolactone.  2. Coronary artery disease involving native coronary artery of native heart without angina pectoris Nonobstructive disease by cardiac catheterization in 2018.  Low risk Myoview in 10/21.  She has chronic chest discomfort.  She had improved symptoms on ranolazine.  Her symptoms have returned since stopping ranolazine.  I suspect she may have microvascular angina.  I have recommended resuming ranolazine 500 mg twice daily.  Continue clopidogrel 75 mg daily, evolocumab 140 mg every 14 days, isosorbide 90 mg daily, nebivolol 10 mg daily.  Follow-up in 6 months.  I have asked her to send me a message in my chart after she is back on her noticing for a couple of weeks to apprise me of her symptoms.  3. Palpitations Recent monitor with 1 brief episode of SVT and low burden of PACs and PVCs.  Otherwise there were no significant arrhythmias.  Continue nebivolol 10 mg daily.  4. Essential hypertension Blood pressure is controlled.  Continue isosorbide 90 mg daily, losartan 25 mg daily, nebivolol 10 mg daily.  5. Mixed hyperlipidemia Lipids optimal.  Continue evolocumab 140 mg every 2 weeks.  6. Aortic atherosclerosis (HCC) Continue clopidogrel, evolocumab.   Dispo:  Return in about 6 months (around 04/25/2021) for Routine 6 month follow up with [name] or Richardson Dopp, PA-C..   Medication Adjustments/Labs and Tests Ordered: Current  medicines are reviewed at length with the patient today.  Concerns regarding medicines are outlined above.  Tests Ordered: No orders of the defined types were placed in this encounter.  Medication Changes: Meds ordered this encounter  Medications   ranolazine (RANEXA) 500 MG 12 hr tablet    Sig: Take 1 tablet (500 mg total) by mouth 2 (two) times daily.    Dispense:  180 tablet    Refill:  3   Signed, Richardson Dopp, PA-C  10/26/2020 12:01 PM    [facility] [place], [place], [place]  [address] Phone: [phone]; Fax: [phone]

## 2020-10-26 ENCOUNTER — Other Ambulatory Visit: Payer: Self-pay

## 2020-10-26 ENCOUNTER — Ambulatory Visit (INDEPENDENT_AMBULATORY_CARE_PROVIDER_SITE_OTHER): Payer: Medicare Other | Admitting: Physician Assistant

## 2020-10-26 ENCOUNTER — Encounter: Payer: Self-pay | Admitting: Physician Assistant

## 2020-10-26 VITALS — BP 102/60 | HR 76 | Ht 63.0 in | Wt 200.0 lb

## 2020-10-26 DIAGNOSIS — I2510 Atherosclerotic heart disease of native coronary artery without angina pectoris: Secondary | ICD-10-CM

## 2020-10-26 DIAGNOSIS — I2583 Coronary atherosclerosis due to lipid rich plaque: Secondary | ICD-10-CM

## 2020-10-26 DIAGNOSIS — I5032 Chronic diastolic (congestive) heart failure: Secondary | ICD-10-CM

## 2020-10-26 DIAGNOSIS — E782 Mixed hyperlipidemia: Secondary | ICD-10-CM | POA: Diagnosis not present

## 2020-10-26 DIAGNOSIS — I10 Essential (primary) hypertension: Secondary | ICD-10-CM

## 2020-10-26 DIAGNOSIS — I700 Atherosclerosis of aorta: Secondary | ICD-10-CM | POA: Diagnosis not present

## 2020-10-26 DIAGNOSIS — R002 Palpitations: Secondary | ICD-10-CM | POA: Diagnosis not present

## 2020-10-26 MED ORDER — RANOLAZINE ER 500 MG PO TB12: 500.0000 mg | ORAL_TABLET | Freq: Two times a day (BID) | ORAL | 3 refills | Status: DC

## 2020-10-26 NOTE — Patient Instructions (Signed)
Medication Instructions:   RESTART Ranexa one tablet by mouth ( 500 mg) twice daily.   *If you need a refill on your cardiac medications before your next appointment, please call your pharmacy*  Lab Work:  -NONE  If you have labs (blood work) drawn today and your tests are completely normal, you will receive your results only by: [place] (if you have MyChart) OR A paper copy in the mail If you have any lab test that is abnormal or we need to change your treatment, we will call you to review the results.  Testing/Procedures:  -NONE  Follow-Up: At [facility], you and your health needs are our priority.  As part of our continuing mission to provide you with exceptional heart care, we have created designated Provider Care Teams.  These Care Teams include your primary Cardiologist (physician) and Advanced Practice Providers (APPs -  Physician Assistants and Nurse Practitioners) who all work together to provide you with the care you need, when you need it.  We recommend signing up for the patient portal called "MyChart".  Sign up information is provided on this After Visit Summary.  MyChart is used to connect with patients for Virtual Visits (Telemedicine).  Patients are able to view lab/test results, encounter notes, upcoming appointments, etc.  Non-urgent messages can be sent to your provider as well.   To learn more about what you can do with MyChart, go to [email or web address].    Your next appointment:   6 month(s)  The format for your next appointment:   In Person  Provider:   You may see Freada Bergeron, MD or one of the following Advanced Practice Providers on your designated Care Team:   Richardson Dopp, [place]  Other Instructions

## 2020-10-31 ENCOUNTER — Other Ambulatory Visit: Payer: Self-pay | Admitting: Internal Medicine

## 2020-11-03 ENCOUNTER — Other Ambulatory Visit: Payer: Self-pay | Admitting: Physician Assistant

## 2020-11-05 ENCOUNTER — Other Ambulatory Visit: Payer: Self-pay | Admitting: Physician Assistant

## 2020-11-07 ENCOUNTER — Other Ambulatory Visit: Payer: Self-pay | Admitting: Internal Medicine

## 2020-11-17 DIAGNOSIS — Z01419 Encounter for gynecological examination (general) (routine) without abnormal findings: Secondary | ICD-10-CM | POA: Diagnosis not present

## 2020-11-17 DIAGNOSIS — Z6835 Body mass index (BMI) 35.0-35.9, adult: Secondary | ICD-10-CM | POA: Diagnosis not present

## 2020-11-17 DIAGNOSIS — Z1231 Encounter for screening mammogram for malignant neoplasm of breast: Secondary | ICD-10-CM | POA: Diagnosis not present

## 2020-11-20 ENCOUNTER — Other Ambulatory Visit: Payer: Self-pay | Admitting: Internal Medicine

## 2020-11-30 ENCOUNTER — Telehealth: Payer: Self-pay | Admitting: Internal Medicine

## 2020-11-30 NOTE — Telephone Encounter (Signed)
LVM for pt to rtn my call to schedule AWV with [facility]. Please schedule if pt calls the office.  ?

## 2020-12-02 ENCOUNTER — Telehealth: Payer: Self-pay | Admitting: Internal Medicine

## 2020-12-02 NOTE — Telephone Encounter (Signed)
LVM for pt to rtn my call to schedule AWV with [facility]. Please schedule appt if pt calls the office.  

## 2020-12-12 ENCOUNTER — Other Ambulatory Visit: Payer: Self-pay | Admitting: *Deleted

## 2020-12-12 DIAGNOSIS — R002 Palpitations: Secondary | ICD-10-CM

## 2020-12-12 DIAGNOSIS — R079 Chest pain, unspecified: Secondary | ICD-10-CM

## 2020-12-12 MED ORDER — ISOSORBIDE MONONITRATE ER 30 MG PO TB24: 90.0000 mg | ORAL_TABLET | Freq: Every day | ORAL | 1 refills | Status: DC

## 2020-12-16 ENCOUNTER — Ambulatory Visit: Payer: Medicare Other

## 2020-12-16 ENCOUNTER — Ambulatory Visit (INDEPENDENT_AMBULATORY_CARE_PROVIDER_SITE_OTHER): Payer: Medicare Other

## 2020-12-16 DIAGNOSIS — Z0000 Encounter for general adult medical examination without abnormal findings: Secondary | ICD-10-CM | POA: Diagnosis not present

## 2020-12-16 NOTE — Progress Notes (Signed)
Subjective:   Samantha [name] is a 80 y.o. female who presents for Medicare Annual (Subsequent) preventive examination.  I connected with Shalaine Hinchey today by telephone and verified that I am speaking with the correct person using two identifiers. Location patient: home Location provider: work Persons participating in the virtual visit: patient, provider.   I discussed the limitations, risks, security and privacy concerns of performing an evaluation and management service by telephone and the availability of in person appointments. I also discussed with the patient that there may be a patient responsible charge related to this service. The patient expressed understanding and verbally consented to this telephonic visit.    Interactive audio and video telecommunications were attempted between this provider and patient, however failed, due to patient having technical difficulties OR patient did not have access to video capability.  We continued and completed visit with audio only.    Review of Systems     Cardiac Risk Factors include: advanced age (>70men, >87 women);dyslipidemia;hypertension;diabetes mellitus     Objective:    Today's Vitals   There is no height or weight on file to calculate BMI.  Advanced Directives 12/16/2020 06/24/2020 12/04/2019 08/27/2019 08/21/2018 01/20/2018 01/16/2018  Does Patient Have a Medical Advance Directive? Yes No No Yes Yes Yes Yes  Type of Paramedic of [place];Living will - - Living will [place];Living will Living will -  Does patient want to make changes to medical advance directive? - - No - Patient declined No - Patient declined - No - Patient declined -  Copy of [place] in Chart? No - copy requested - - - No - copy requested - -  Would patient like information on creating a medical advance directive? - - No - Patient declined - - - -  Pre-existing out of facility DNR order  (yellow form or pink MOST form) - - - - - - -    Current Medications (verified) Outpatient Encounter Medications as of 12/16/2020  Medication Sig   acetaminophen (TYLENOL) 500 MG tablet Take 500 mg by mouth every 6 (six) hours as needed for mild pain.    albuterol (PROVENTIL) (2.5 MG/3ML) 0.083% nebulizer solution Take 3 mLs (2.5 mg total) by nebulization every 6 (six) hours as needed for wheezing or shortness of breath.   Calcium-Magnesium-Zinc (CAL-MAG-ZINC PO) Take 1 tablet by mouth daily.   cetirizine (ZYRTEC) 10 MG tablet TAKE 1 TABLET(10 MG) BY MOUTH DAILY   cholecalciferol (VITAMIN D) 1000 UNITS tablet Take 1,000 Units by mouth every morning.    clopidogrel (PLAVIX) 75 MG tablet TAKE 1 TABLET(75 MG) BY MOUTH DAILY   dapagliflozin propanediol (FARXIGA) 10 MG TABS tablet Take 1 tablet (10 mg total) by mouth daily before breakfast.   famotidine (PEPCID) 40 MG tablet TAKE 1 TABLET(40 MG) BY MOUTH DAILY AS NEEDED FOR HEARTBURN OR INDIGESTION   glucose blood test strip Use as instructed   hydroxychloroquine (PLAQUENIL) 200 MG tablet Take 400 mg by mouth daily.    isosorbide mononitrate (IMDUR) 30 MG 24 hr tablet Take 3 tablets (90 mg total) by mouth daily.   Lancets (ONETOUCH ULTRASOFT) lancets Use as instructed   losartan (COZAAR) 25 MG tablet TAKE 1 TABLET(25 MG) BY MOUTH DAILY   nebivolol (BYSTOLIC) 10 MG tablet TAKE 1 TABLET(10 MG) BY MOUTH DAILY   nitroGLYCERIN (NITROSTAT) 0.4 MG SL tablet DISSOLVE 1 TABLET UNDER THE TONGUE EVERY 5 MINUTES AS NEEDED FOR CHEST PAIN   Polyethyl Glycol-Propyl  Glycol (SYSTANE) 0.4-0.3 % GEL ophthalmic gel Place 1 application into both eyes daily as needed (for dry eyes).    pregabalin (LYRICA) 75 MG capsule TAKE 1 CAPSULE(75 MG) BY MOUTH AT BEDTIME   psyllium (METAMUCIL) 58.6 % powder Take 1 packet by mouth daily.   ranolazine (RANEXA) 500 MG 12 hr tablet Take 1 tablet (500 mg total) by mouth 2 (two) times daily.   REPATHA SURECLICK 161 MG/ML SOAJ INJECT 1  PEN UNDER THE SKIN EVERY 14 DAYS   SYMBICORT 80-4.5 MCG/ACT inhaler INHALE 2 PUFFS INTO THE LUNGS TWICE DAILY   torsemide (DEMADEX) 20 MG tablet Take one tablet by mouth every other day   VENTOLIN HFA 108 (90 Base) MCG/ACT inhaler INHALE 2 PUFFS INTO THE LUNGS EVERY 6 HOURS AS NEEDED FOR WHEEZING OR SHORTNESS OF BREATH   No facility-administered encounter medications on file as of 12/16/2020.    Allergies (verified) Bactrim [sulfamethoxazole-trimethoprim], Cefuroxime axetil, Oxycodone, Pravastatin, Seldane [terfenadine], Zocor [simvastatin], Tramadol, Atorvastatin, Cefuroxime, Lactose intolerance (gi), Latex, Lime flavor [flavoring agent], Metformin and related, Oxycodone hcl, Pravastatin sodium, Rosuvastatin, Rosuvastatin calcium, and Tape   History: Past Medical History:  Diagnosis Date   A-fib ([place])    Abnormal CT of the chest 2008   last CT4-l 2009:  . No f/u suggested    Allergy    Asthma    CAD (coronary artery disease)    a. Coronary CTA 10/16: Coronary Ca score 211, mod non-obstructive CAD with LM mild plaque (25-50%), mid LAD 50-69%. b. Neg nuc 06/2015.   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   Minimally invasive right hemicolectomy to be done    Neuromuscular disorder (HCC)    FIBROMYALGIA   Ocular migraine    OSA (obstructive sleep apnea) 09/2007   dx w/ a sleep study, not on  CPAP   Osteoarthritis    Osteoporosis    Pneumonia    "double" in 2004   PONV (postoperative nausea and vomiting)    Reactive airway disease 01/29/2002   dx of pseudoasthma / vcd in 2005 and nl sprirometry History of dyspnea, 2011,  improved after  several medications were changed around Question of COPD, disproved July 06, 2009 with nl pft's       Rheumatoid factor positive    Shingles 11/2009   Sleep apnea    Stroke ([place])    TIA (transient ischemic attack)    x2 - on Plavix for this   Torn rotator cuff    right worse than left, both are torn   Tumor, 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  Hank Smith (done after colon surgery)   CATARACT EXTRACTION W/ INTRAOCULAR LENS  IMPLANT, BILATERAL  2012   COLON SURGERY  10/2014   EYE SURGERY Bilateral    torq lens for cataracts   LEFT HEART CATH AND CORONARY ANGIOGRAPHY N/A 03/22/2016   Procedure: Left Heart Cath and Coronary Angiography;  Surgeon: Belva Crome, MD;  Location: [facility];  Service: Cardiovascular;  Laterality: N/A;   LUMBAR LAMINECTOMY/DECOMPRESSION MICRODISCECTOMY N/A 01/20/2018   Procedure: L4-5 decompression;  Surgeon: Marybelle Killings, MD;  Location: [facility];  Service: Orthopedics;  Laterality: N/A;   THYROIDECTOMY, PARTIAL  1960's   TOTAL KNEE ARTHROPLASTY Right 12/04/2019   Procedure: RIGHT TOTAL KNEE ARTHROPLASTY;  Surgeon: Marybelle Killings, MD;  Location: [facility];  Service: Orthopedics;  Laterality: Right;  RNFA APRIL [place]   "Laser surgery for vaginal cancer; followed by chemotherapy" (06/27/2012)   Family History  Problem Relation Age of Onset   Heart disease Father    Heart disease Mother    Lung cancer Mother    Allergies Sister    Parkinsonism Sister        possible   Asthma Sister    Asthma Paternal Grandmother    Stroke Paternal Grandmother     Heart disease Other        paternal grandparents, maternal grandparents,    Heart disease Brother    Emphysema Brother    Aneurysm Brother        x3   Kidney failure Brother    Diabetes Brother    Diabetes Brother    Stroke Brother    Stroke Maternal Grandmother    Breast cancer Neg Hx    Colon cancer Neg Hx    Heart attack Neg Hx    Esophageal cancer Neg Hx    Rectal cancer Neg Hx    Stomach cancer Neg Hx    Social History   Socioeconomic History   Marital status: Married    Spouse name: Ilona Sorrel   Number of children: 2   Years of education: masters   Highest education level: Not on file  Occupational History   Occupation: Retired, disable since 2000    Employer: RETIRED  Tobacco Use   Smoking status: Former    Packs/day: 0.25    Years: 5.00    Pack years: 1.25    Types: Cigarettes    Quit date: 01/29/1998    Years since quitting: 22.8   Smokeless tobacco: Never   Tobacco comments:    Quit in 2001  Vaping Use   Vaping Use: Never used  Substance and Sexual Activity   Alcohol use: No    Alcohol/week: 0.0 standard drinks   Drug use: No   Sexual activity: Never  Other Topics Concern   Not on file  Social History Narrative   On disability since 2000--- also husband has MS   Education. College.   Right handed.   Social Determinants of Health   Financial Resource Strain: Low Risk    Difficulty of Paying Living Expenses: Not hard at all  Food Insecurity: No Food Insecurity   Worried About Charity fundraiser in the Last Year: Never true   [place] in the Last Year: Never true  Transportation Needs: No Transportation Needs   Lack of Transportation (Medical): No   Lack of Transportation (Non-Medical): No  Physical Activity: Insufficiently Active   Days of Exercise per Week: 4 days  Minutes of Exercise per Session: 30 min  Stress: No Stress Concern Present   Feeling of Stress : Not at all  Social Connections: Moderately Isolated   Frequency of  Communication with Friends and Family: Twice a week   Frequency of Social Gatherings with Friends and Family: Twice a week   Attends Religious Services: More than 4 times per year   Active Member of [employer] or Organizations: No   Attends Archivist Meetings: Never   Marital Status: Widowed    Tobacco Counseling Counseling given: Not Answered Tobacco comments: Quit in 2001   Clinical Intake:  Pre-visit preparation completed: Yes  Pain : No/denies pain     Nutritional Risks: None Diabetes: No (per patient no longer taking medications and BS have been normal less than 100)  How often do you need to have someone help you when you read instructions, pamphlets, or other written materials from your doctor or pharmacy?: 1 - Never What is the last grade level you completed in school?: masters  Diabetic?no   Interpreter Needed?: No  Information entered by :: L.Clever Geraldo,LPN   Activities of Daily Living In your present state of health, do you have any difficulty performing the following activities: 12/16/2020  Hearing? N  Vision? Y  Difficulty concentrating or making decisions? N  Walking or climbing stairs? N  Dressing or bathing? N  Doing errands, shopping? N  Preparing Food and eating ? N  Using the Toilet? N  In the past six months, have you accidently leaked urine? N  Do you have problems with loss of bowel control? N  Managing your Medications? N  Managing your Finances? N  Housekeeping or managing your Housekeeping? N  Some recent data might be hidden    Patient Care Team: [name], MD as PCP - General (Internal Medicine) Freada Bergeron, MD as PCP - Cardiology (Cardiology) [name], MD as Consulting Physician (Ophthalmology) [name], MD (Inactive) as Consulting Physician (Cardiology) [name], MD as Consulting Physician (Obstetrics and Gynecology) [name], MD as Consulting Physician (General Surgery) Zadie Rhine Clent Demark,  MD as Consulting Physician (Ophthalmology) Marybelle Killings, MD as Consulting Physician (Orthopedic Surgery) Sharmon Revere as Physician Assistant (Cardiology)  Indicate any recent Medical Services you may have received from other than [facility] providers in the past year (date may be approximate).     Assessment:   This is a routine wellness examination for Samantha [name].  Hearing/Vision screen Vision Screening - Comments:: Annual eye exams   Dietary issues and exercise activities discussed: Current Exercise Habits: Home exercise routine, Type of exercise: walking, Time (Minutes): 30, Frequency (Times/Week): 4, Weekly Exercise (Minutes/Week): 120, Intensity: Mild, Exercise limited by: None identified   Goals Addressed   None    Depression Screen PHQ 2/9 Scores 12/16/2020 12/16/2020 08/27/2019 08/21/2018 08/13/2017 10/19/2016 10/19/2015  PHQ - 2 Score 0 0 0 1 2 0 0  PHQ- 9 Score - - - 1 6 2  -    Fall Risk Fall Risk  12/16/2020 10/20/2020 08/27/2019 08/21/2018 08/13/2017  Falls in the past year? 0 0 0 1 No  Number falls in past yr: 0 0 0 1 -  Injury with Fall? 0 0 0 1 -  Risk for fall due to : (No Data) No Fall Risks Impaired balance/gait;Orthopedic patient Impaired mobility;Impaired balance/gait -  Risk for fall due to: Comment uses cane when out - - - -  Follow up Falls evaluation completed Falls evaluation completed Falls evaluation  completed Falls prevention discussed;Education provided -    FALL RISK PREVENTION PERTAINING TO THE HOME:  Any stairs in or around the home? Yes  If so, are there any without handrails? No  Home free of loose throw rugs in walkways, pet beds, electrical cords, etc? Yes  Adequate lighting in your home to reduce risk of falls? Yes   ASSISTIVE DEVICES UTILIZED TO PREVENT FALLS:  Life alert? No  Use of a cane, walker or w/c? Yes  Grab bars in the bathroom? Yes  Shower chair or bench in shower? Yes  Elevated toilet seat or a handicapped toilet? Yes    Cognitive Function: Normal cognitive status assessed by direct observation by this Nurse Health Advisor. No abnormalities found.   MMSE - Mini Mental State Exam 08/13/2017 10/19/2016  Orientation to time 5 5  Orientation to Place 5 5  Registration 3 3  Attention/ Calculation 5 5  Recall 1 2  Language- name 2 objects 2 2  Language- repeat 1 1  Language- follow 3 step command 3 3  Language- read & follow direction 1 1  Write a sentence 1 1  Copy design 1 1  Total score 28 29        Immunizations Immunization History  Administered Date(s) Administered   Fluad Quad(high Dose 65+) 11/05/2018, 11/04/2019   Influenza Split 11/30/2010   Influenza Whole 10/20/2008, 01/02/2010   Influenza, High Dose Seasonal PF 12/24/2012, 03/17/2015, 10/19/2015, 10/19/2016, 10/23/2017   Influenza, Seasonal, Injecte, Preservative Fre 01/01/2012   Influenza,inj,Quad PF,6+ Mos 11/13/2013   PFIZER(Purple Top)SARS-COV-2 Vaccination 03/24/2019, 04/14/2019   Pneumococcal Conjugate-13 05/19/2014   Pneumococcal Polysaccharide-23 01/29/2002, 11/23/2008   Td 01/29/2001   Tdap 08/14/2011   Zoster, Live 12/24/2012    TDAP status: Up to date  Flu Vaccine status: Up to date  Pneumococcal vaccine status: Up to date  Covid-19 vaccine status: Completed vaccines  Qualifies for Shingles Vaccine? Yes   Zostavax completed No   Shingrix Completed?: No.    Education has been provided regarding the importance of this vaccine. Patient has been advised to call insurance company to determine out of pocket expense if they have not yet received this vaccine. Advised may also receive vaccine at local pharmacy or Health Dept. Verbalized acceptance and understanding.  Screening Tests Health Maintenance  Topic Date Due   Zoster Vaccines- Shingrix (1 of 2) Never done   FOOT EXAM  05/05/2019   COVID-19 Vaccine (3 - Pfizer risk series) 05/12/2019   INFLUENZA VACCINE  08/29/2020   HEMOGLOBIN A1C  04/19/2021   DEXA SCAN   06/11/2021   OPHTHALMOLOGY EXAM  07/11/2021   TETANUS/TDAP  08/13/2021   COLONOSCOPY (Pts 45-29yrs Insurance coverage will need to be confirmed)  04/19/2024   Pneumonia Vaccine 13+ Years old  Completed   Hepatitis C Screening  Completed   HPV VACCINES  Aged Out    Health Maintenance  Health Maintenance Due  Topic Date Due   Zoster Vaccines- Shingrix (1 of 2) Never done   FOOT EXAM  05/05/2019   COVID-19 Vaccine (3 - Pfizer risk series) 05/12/2019   INFLUENZA VACCINE  08/29/2020    Colorectal cancer screening: No longer required.   Mammogram status: No longer required due to age.  Bone Density status: Completed 06/11/2016. Results reflect: Bone density results: OSTEOPENIA. Repeat every 5 years.  Lung Cancer Screening: (Low Dose CT Chest recommended if Age 11-80 years, 30 pack-year currently smoking OR have quit w/in 15years.) does not qualify.   Lung Cancer  Screening Referral: n/a  Additional Screening:  Hepatitis C Screening: does not qualify; Completed 11/04/2019  Vision Screening: Recommended annual ophthalmology exams for early detection of glaucoma and other disorders of the eye. Is the patient up to date with their annual eye exam?  Yes  Who is the provider or what is the name of the office in which the patient attends annual eye exams? Dr. Lester Kinsman  If pt is not established with a provider, would they like to be referred to a provider to establish care? No .   Dental Screening: Recommended annual dental exams for proper oral hygiene  Community Resource Referral / Chronic Care Management: CRR required this visit?  No   CCM required this visit?  No      Plan:     I have personally reviewed and noted the following in the patient's chart:   Medical and social history Use of alcohol, tobacco or illicit drugs  Current medications and supplements including opioid prescriptions.  Functional ability and status Nutritional status Physical activity Advanced  directives List of other physicians Hospitalizations, surgeries, and ER visits in previous 12 months Vitals Screenings to include cognitive, depression, and falls Referrals and appointments  In addition, I have reviewed and discussed with patient certain preventive protocols, quality metrics, and best practice recommendations. A written personalized care plan for preventive services as well as general preventive health recommendations were provided to patient.     Randel Pigg, LPN   01/60/1093   Nurse Notes: none

## 2020-12-16 NOTE — Patient Instructions (Signed)
Samantha [name] , Thank you for taking time to come for your Medicare Wellness Visit. I appreciate your ongoing commitment to your health goals. Please review the following plan we discussed and let me know if I can assist you in the future.   Screening recommendations/referrals: Colonoscopy: no longer required  Mammogram: no longer required  Bone Density: 06/11/2016 Recommended yearly ophthalmology/optometry visit for glaucoma screening and checkup Recommended yearly dental visit for hygiene and checkup  Vaccinations: Influenza vaccine: completed  Pneumococcal vaccine: completed  Tdap vaccine: 08/14/2011  due 2023  Shingles vaccine: will consider     Advanced directives: will provide copies   Conditions/risks identified: none   Next appointment: CPE 02/21/2021  11am  Dr. Quay Burow    Preventive Care 17 Years and Older, Female Preventive care refers to lifestyle choices and visits with your health care provider that can promote health and wellness. What does preventive care include? A yearly physical exam. This is also called an annual well check. Dental exams once or twice a year. Routine eye exams. Ask your health care provider how often you should have your eyes checked. Personal lifestyle choices, including: Daily care of your teeth and gums. Regular physical activity. Eating a healthy diet. Avoiding tobacco and drug use. Limiting alcohol use. Practicing safe sex. Taking low-dose aspirin every day. Taking vitamin and mineral supplements as recommended by your health care provider. What happens during an annual well check? The services and screenings done by your health care provider during your annual well check will depend on your age, overall health, lifestyle risk factors, and family history of disease. Counseling  Your health care provider may ask you questions about your: Alcohol use. Tobacco use. Drug use. Emotional well-being. Home and relationship well-being. Sexual  activity. Eating habits. History of falls. Memory and ability to understand (cognition). Work and work Statistician. Reproductive health. Screening  You may have the following tests or measurements: Height, weight, and BMI. Blood pressure. Lipid and cholesterol levels. These may be checked every 5 years, or more frequently if you are over 2 years old. Skin check. Lung cancer screening. You may have this screening every year starting at age 41 if you have a 30-pack-year history of smoking and currently smoke or have quit within the past 15 years. Fecal occult blood test (FOBT) of the stool. You may have this test every year starting at age 108. Flexible sigmoidoscopy or colonoscopy. You may have a sigmoidoscopy every 5 years or a colonoscopy every 10 years starting at age 63. Hepatitis C blood test. Hepatitis B blood test. Sexually transmitted disease (STD) testing. Diabetes screening. This is done by checking your blood sugar (glucose) after you have not eaten for a while (fasting). You may have this done every 1-3 years. Bone density scan. This is done to screen for osteoporosis. You may have this done starting at age 68. Mammogram. This may be done every 1-2 years. Talk to your health care provider about how often you should have regular mammograms. Talk with your health care provider about your test results, treatment options, and if necessary, the need for more tests. Vaccines  Your health care provider may recommend certain vaccines, such as: Influenza vaccine. This is recommended every year. Tetanus, diphtheria, and acellular pertussis (Tdap, Td) vaccine. You may need a Td booster every 10 years. Zoster vaccine. You may need this after age 71. Pneumococcal 13-valent conjugate (PCV13) vaccine. One dose is recommended after age 53. Pneumococcal polysaccharide (PPSV23) vaccine. One dose is recommended after  age 25. Talk to your health care provider about which screenings and vaccines  you need and how often you need them. This information is not intended to replace advice given to you by your health care provider. Make sure you discuss any questions you have with your health care provider. Document Released: 02/11/2015 Document Revised: 10/05/2015 Document Reviewed: 11/16/2014 Elsevier Interactive Patient Education  2017 [employer] Prevention in the Home Falls can cause injuries. They can happen to people of all ages. There are many things you can do to make your home safe and to help prevent falls. What can I do on the outside of my home? Regularly fix the edges of walkways and driveways and fix any cracks. Remove anything that might make you trip as you walk through a door, such as a raised step or threshold. Trim any bushes or trees on the path to your home. Use bright outdoor lighting. Clear any walking paths of anything that might make someone trip, such as rocks or tools. Regularly check to see if handrails are loose or broken. Make sure that both sides of any steps have handrails. Any raised decks and porches should have guardrails on the edges. Have any leaves, snow, or ice cleared regularly. Use sand or salt on walking paths during winter. Clean up any spills in your garage right away. This includes oil or grease spills. What can I do in the bathroom? Use night lights. Install grab bars by the toilet and in the tub and shower. Do not use towel bars as grab bars. Use non-skid mats or decals in the tub or shower. If you need to sit down in the shower, use a plastic, non-slip stool. Keep the floor dry. Clean up any water that spills on the floor as soon as it happens. Remove soap buildup in the tub or shower regularly. Attach bath mats securely with double-sided non-slip rug tape. Do not have throw rugs and other things on the floor that can make you trip. What can I do in the bedroom? Use night lights. Make sure that you have a light by your bed that  is easy to reach. Do not use any sheets or blankets that are too big for your bed. They should not hang down onto the floor. Have a firm chair that has side arms. You can use this for support while you get dressed. Do not have throw rugs and other things on the floor that can make you trip. What can I do in the kitchen? Clean up any spills right away. Avoid walking on wet floors. Keep items that you use a lot in easy-to-reach places. If you need to reach something above you, use a strong step stool that has a grab bar. Keep electrical cords out of the way. Do not use floor polish or wax that makes floors slippery. If you must use wax, use non-skid floor wax. Do not have throw rugs and other things on the floor that can make you trip. What can I do with my stairs? Do not leave any items on the stairs. Make sure that there are handrails on both sides of the stairs and use them. Fix handrails that are broken or loose. Make sure that handrails are as long as the stairways. Check any carpeting to make sure that it is firmly attached to the stairs. Fix any carpet that is loose or worn. Avoid having throw rugs at the top or bottom of the stairs. If you do  have throw rugs, attach them to the floor with carpet tape. Make sure that you have a light switch at the top of the stairs and the bottom of the stairs. If you do not have them, ask someone to add them for you. What else can I do to help prevent falls? Wear shoes that: Do not have high heels. Have rubber bottoms. Are comfortable and fit you well. Are closed at the toe. Do not wear sandals. If you use a stepladder: Make sure that it is fully opened. Do not climb a closed stepladder. Make sure that both sides of the stepladder are locked into place. Ask someone to hold it for you, if possible. Clearly mark and make sure that you can see: Any grab bars or handrails. First and last steps. Where the edge of each step is. Use tools that help you  move around (mobility aids) if they are needed. These include: Canes. Walkers. Scooters. Crutches. Turn on the lights when you go into a dark area. Replace any light bulbs as soon as they burn out. Set up your furniture so you have a clear path. Avoid moving your furniture around. If any of your floors are uneven, fix them. If there are any pets around you, be aware of where they are. Review your medicines with your doctor. Some medicines can make you feel dizzy. This can increase your chance of falling. Ask your doctor what other things that you can do to help prevent falls. This information is not intended to replace advice given to you by your health care provider. Make sure you discuss any questions you have with your health care provider. Document Released: 11/11/2008 Document Revised: 06/23/2015 Document Reviewed: 02/19/2014 Elsevier Interactive Patient Education  2017 [employer].

## 2020-12-19 ENCOUNTER — Ambulatory Visit: Payer: Medicare Other | Admitting: Physician Assistant

## 2020-12-23 ENCOUNTER — Other Ambulatory Visit: Payer: Self-pay | Admitting: Internal Medicine

## 2021-01-07 ENCOUNTER — Other Ambulatory Visit: Payer: Self-pay | Admitting: Internal Medicine

## 2021-01-09 ENCOUNTER — Telehealth: Payer: Self-pay | Admitting: *Deleted

## 2021-01-09 NOTE — Chronic Care Management (AMB) (Signed)
  Chronic Care Management   Note  01/09/2021 Name: Samantha [name] MRN: [id number] DOB: 11/06/1940  Samantha [name] is a 80 y.o. year old female who is a primary care patient of Burns, Claudina Lick, MD. I reached out to [place] by phone today in response to a referral sent by [name]'s PCP.  [name] was given information about Chronic Care Management services today including:  CCM service includes personalized support from designated clinical staff supervised by her physician, including individualized plan of care and coordination with other care providers 24/7 contact phone numbers for assistance for urgent and routine care needs. Service will only be billed when office clinical staff spend 20 minutes or more in a month to coordinate care. Only one practitioner may furnish and bill the service in a calendar month. The patient may stop CCM services at any time (effective at the end of the month) by phone call to the office staff. The patient is responsible for co-pay (up to 20% after annual deductible is met) if co-pay is required by the individual health plan.   Patient agreed to services and verbal consent obtained.   Follow up plan: Telephone appointment with care management team member scheduled for: 01/24/21  [place] [facility]  Direct Dial: [phone]

## 2021-01-24 ENCOUNTER — Ambulatory Visit (INDEPENDENT_AMBULATORY_CARE_PROVIDER_SITE_OTHER): Payer: Medicare Other | Admitting: *Deleted

## 2021-01-24 DIAGNOSIS — I10 Essential (primary) hypertension: Secondary | ICD-10-CM

## 2021-01-24 DIAGNOSIS — I5032 Chronic diastolic (congestive) heart failure: Secondary | ICD-10-CM

## 2021-01-24 DIAGNOSIS — Z794 Long term (current) use of insulin: Secondary | ICD-10-CM

## 2021-01-24 NOTE — Chronic Care Management (AMB) (Signed)
Chronic Care Management   CCM RN Visit Note  01/24/2021 Name: Samantha [name] MRN: [id number] DOB: [birth year]  Subjective: Samantha [name] is a 80 y.o. year old female who is a primary care patient of Burns, Claudina Lick, MD. The care management team was consulted for assistance with disease management and care coordination needs.    Engaged with patient by telephone for initial visit in response to provider referral for case management and/or care coordination services.   Consent to Services:  The patient was given information about Chronic Care Management services, agreed to services, and gave verbal consent 01/09/21 prior to initiation of services.  Please see initial visit note for detailed documentation.  Patient agreed to services and verbal consent obtained.   Assessment: Review of patient past medical history, allergies, medications, health status, including review of consultants reports, laboratory and other test data, was performed as part of comprehensive evaluation and provision of chronic care management services.   SDOH (Social Determinants of Health) assessments and interventions performed:  SDOH Interventions    Flowsheet Row Most Recent Value  SDOH Interventions   Food Insecurity Interventions Intervention Not Indicated  [Gets MOW every day]  Housing Interventions Intervention Not Indicated  [Single Family Home,  14 years,  lives with daughter]  Transportation Interventions Intervention Not Indicated  [Patient drives self]      CCM Care Plan Allergies  Allergen Reactions   Bactrim [Sulfamethoxazole-Trimethoprim] Other (See Comments)    See OV 09-15-13, rash-tongue swelling due to bactrim ?   Cefuroxime Axetil Hives   Oxycodone Nausea And Vomiting   Pravastatin Other (See Comments)    "muscle breakdown" with profuse sweating   Seldane [Terfenadine] Hives   Zocor [Simvastatin] Other (See Comments)    2012 "Muscle breakdown " with profuse sweating   Tramadol  Nausea And Vomiting   Atorvastatin Other (See Comments)    Pt reports muscle aches and joint pains   Cefuroxime Other (See Comments)   Lactose Intolerance (Gi) Diarrhea   Latex     blisters   Lime Flavor [Flavoring Agent] Diarrhea   Metformin And Related Diarrhea   Oxycodone Hcl Other (See Comments)   Pravastatin Sodium Other (See Comments)   Rosuvastatin Other (See Comments)    Pt reports "myalgias, fatigue, nosebleeds."   Rosuvastatin Calcium Other (See Comments)   Tape Other (See Comments)    blisters   Outpatient Encounter Medications as of 01/24/2021  Medication Sig   acetaminophen (TYLENOL) 500 MG tablet Take 500 mg by mouth every 6 (six) hours as needed for mild pain.    albuterol (PROVENTIL) (2.5 MG/3ML) 0.083% nebulizer solution Take 3 mLs (2.5 mg total) by nebulization every 6 (six) hours as needed for wheezing or shortness of breath.   Calcium-Magnesium-Zinc (CAL-MAG-ZINC PO) Take 1 tablet by mouth daily.   cetirizine (ZYRTEC) 10 MG tablet TAKE 1 TABLET(10 MG) BY MOUTH DAILY   cholecalciferol (VITAMIN D) 1000 UNITS tablet Take 1,000 Units by mouth every morning.    clopidogrel (PLAVIX) 75 MG tablet TAKE 1 TABLET(75 MG) BY MOUTH DAILY   dapagliflozin propanediol (FARXIGA) 10 MG TABS tablet Take 1 tablet (10 mg total) by mouth daily before breakfast.   famotidine (PEPCID) 40 MG tablet TAKE 1 TABLET(40 MG) BY MOUTH DAILY AS NEEDED FOR HEARTBURN OR INDIGESTION   glucose blood test strip Use as instructed   hydroxychloroquine (PLAQUENIL) 200 MG tablet Take 400 mg by mouth daily.    isosorbide mononitrate (IMDUR) 30 MG 24 hr tablet Take 3  tablets (90 mg total) by mouth daily.   Lancets (ONETOUCH ULTRASOFT) lancets Use as instructed   losartan (COZAAR) 25 MG tablet TAKE 1 TABLET(25 MG) BY MOUTH DAILY   nebivolol (BYSTOLIC) 10 MG tablet TAKE 1 TABLET(10 MG) BY MOUTH DAILY   nitroGLYCERIN (NITROSTAT) 0.4 MG SL tablet DISSOLVE 1 TABLET UNDER THE TONGUE EVERY 5 MINUTES AS NEEDED FOR  CHEST PAIN   Polyethyl Glycol-Propyl Glycol (SYSTANE) 0.4-0.3 % GEL ophthalmic gel Place 1 application into both eyes daily as needed (for dry eyes).    pregabalin (LYRICA) 75 MG capsule TAKE 1 CAPSULE(75 MG) BY MOUTH AT BEDTIME   psyllium (METAMUCIL) 58.6 % powder Take 1 packet by mouth daily.   ranolazine (RANEXA) 500 MG 12 hr tablet Take 1 tablet (500 mg total) by mouth 2 (two) times daily.   REPATHA SURECLICK 283 MG/ML SOAJ INJECT 1 PEN UNDER THE SKIN EVERY 14 DAYS   SYMBICORT 80-4.5 MCG/ACT inhaler INHALE 2 PUFFS INTO THE LUNGS TWICE DAILY   torsemide (DEMADEX) 20 MG tablet Take one tablet by mouth every other day   VENTOLIN HFA 108 (90 Base) MCG/ACT inhaler INHALE 2 PUFFS INTO THE LUNGS EVERY 6 HOURS AS NEEDED FOR WHEEZING OR SHORTNESS OF BREATH   No facility-administered encounter medications on file as of 01/24/2021.   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   Raynaud phenomenon 05/06/2019   Trigger finger, left ring finger 03/18/2019   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 01/28/2018   Sacral back pain 10/16/2017   Chronic left upper quadrant pain 07/23/2017   Poor balance 03/14/2017   Angina pectoris ([place]) 03/22/2016   Hypertensive heart disease 07/07/2015   Essential hypertension 07/07/2015   Lightheadedness 07/07/2015   Plantar fasciitis, left 03/22/2015   Coronary artery disease due to lipid rich plaque 01/05/2015   Chronic diastolic CHF (congestive heart failure), NYHA  class 2 ([place]) 01/05/2015   Malignant neoplasm of ascending colon  pT1, pN0, rM0 s/p robotic colectomy 11/11/2014 11/11/2014   Migraine ([place]) 01/05/2014   VBI (vertebrobasilar insufficiency) 08/22/2012   TIA (transient ischemic attack) 06/27/2012   DJD (degenerative joint disease) 02/02/2011   Varicose veins of legs 06/06/2010   Palpitations 11/23/2008   UTI'S, RECURRENT 09/28/2008   Fibromyalgia 08/15/2007   DM II (diabetes mellitus, type II), w/ neuropathy 05/21/2006   Hyperlipidemia 05/21/2006   Asthma 01/29/2002   Conditions to be addressed/monitored:  CHF and DMII  Care Plan : RN Care Manager Plan of Care  Updates made by Knox Royalty, RN since 01/24/2021 12:00 AM     Problem: Chronic Disease Management Needs   Priority: High     Long-Range Goal: Development of plan of care for long term chronic disease management   Start Date: 01/24/2021  Expected End Date: 01/24/2022  Priority: High  Note:   Current Barriers:  Chronic Disease Management support and education needs related to CHF and DMII Chronic pain- well managed with medications Caregiver for daughter, has cancer currently under control; patient's brother currently has cancer and patient is periodically  his caregiver- brother lives in [place], [place]: 01/24/21- patient declines need for caregiver resources/ support  RNCM Clinical Goal(s):  Patient will demonstrate ongoing health management independence as evidenced by adherence to plan of care for CHF/ DMII        through collaboration with RN Care manager, provider, and care team.   Interventions: 1:1 collaboration with primary care provider regarding development and update of comprehensive plan of care as evidenced by provider attestation and co-signature Inter-disciplinary care team collaboration (see longitudinal plan of care) Evaluation of current treatment plan related to  self management and patient's adherence to plan as established by provider Initial  assessment completed Discussed management of chronic pain: patient adherent to prescribed medications; reports independently manages pain, prescribed medications "help" Discussed medications: patient independently self-manages and denies concerns around medications today Falls assessment completed: reports no falls x 12 months; occasionally uses cane; would like to have PCP order rollator walker-- has noticed over holidays that ambulation has become increasingly more challenging in setting of chronic pain: states shopping "about wore (her) out;" patient proactively verbalizes plans to discuss with PCP at upcoming scheduled provider office visit 02/21/21  Heart Failure Interventions:  (Status: New goal.)  Long Term Goal  Basic overview and discussion of pathophysiology of Heart Failure reviewed Provided education on low sodium diet Reviewed Heart Failure Action Plan in depth and provided written copy Assessed need for readable accurate scales in home Discussed importance of daily weight and advised patient to weigh and record daily Reviewed role of diuretics in prevention of fluid overload and management of heart failure Discussed the importance of keeping all appointments with provider Provided patient with education about the role of exercise in the management of heart failure Advised patient to discuss stated need for walker with provider Screening for signs and symptoms of depression related to chronic disease state  Assessed social determinant of health barriers Confirmed patient monitors/ records daily weights at home: she reports weighs "3-4 times per week" and reports consistent weights between 190-195 lbs, with a recent weight of "195 lbs;" Discussed action plan for weight gain; she verbalizes a good general understanding of standard guideline to report weight gain > 3 lbs overnight/ 5 lbs in one week Discussed/ provided education around signs/ symptoms CHF yellow zone: she denies  difficulty breathing, increased shortness of breath outside of baseline, lower extremity swelling Confirmed active with cardiology provider: next scheduled visit March 2023 Discussed use of diuretics: patient has history of dehydration: takes diuretics every-other- day as prescribed; verbalizes very good baseline understanding of purpose of diuretics, as well as signs/ symptoms dehydration Confirmed patient monitors/ writes down on paper blood pressures at home: she reports consistent values at home between 140-170/ 80's: reports blood pressure last night of "162/88" Confirmed patient stays as active as possible: has exercise routine at home that she regularly completes: positive reinforcement provided with encouragement to continue efforts  Diabetes:  (Status: New goal.) Long Term Goal   Lab Results  Component Value Date   HGBA1C 6.5 10/20/2020  Assessed patient's understanding of A1c goal: <7% Provided education to patient about basic DM disease process; Reviewed prescribed diet with patient carbohydrate modified/ low sugar, heart healthy, low salt; Counseled on importance of regular laboratory monitoring as prescribed;        Discussed plans with patient for ongoing care management follow up and provided patient with direct contact information for care management team;      Provided patient with written educational materials related to hypo  and hyperglycemia and importance of correct treatment;       Reviewed scheduled/upcoming provider appointments including: 02/21/21- PCP; 04/20/21- cardiology;         Review of patient status, including review of consultants reports, relevant laboratory and other test results, and medications completed;       Confirmed patient monitors/ writes down blood sugars at home QD fasting: reports consistent values between 80-110; report sone recent isolated low value of "69," with signs/ symptoms hypoglycemia- reports immediately drank OJ and "ate breakfast" this  resolved hypoglycemia Provided education/ reinforced patient's baseline understanding of signs/ symptoms hypoglycemia, along with corresponding action plan- patient will benefit from ongoing support/ reinforcement of same Discussed patient's report that "eating sugar makes (her) blood sugars go very low;" confirmed patient avoids eating large portions of sugar, and rarely eats sugar at all- reports good adherence to prescribed diet for DMII; CHF Discussed patient's report of (L) leg "sensitivity;" she reports that her "skin hurts and feels sore;" she denies swelling, wonders if this might be related to PVD/ PAD, vs. Neuropathy: encouraged her to discuss with PCP  Patient Goals/Self-Care Activities: As evidenced by review of EHR, collaboration with care team, and patient reporting during CCM RN CM outreach, Patient Maryalyce will: Take medications as prescribed Attend all scheduled provider appointments Call pharmacy for medication refills Call provider office for new concerns or questions Continue to check fasting (first thing in the morning, before eating) blood sugars at home Continue monitoring your weights and blood pressures at home Continue to follow heart healthy, low salt, low cholesterol, carbohydrate-modified, low sugar diet       Plan: Telephone follow up appointment with care management team member scheduled for:  Tuesday, February 28, 2021 at 9:00 am The patient has been provided with contact information for the care management team and has been advised to call with any health related questions or concerns   Oneta Rack, RN, BSN, August [phone]: direct office

## 2021-01-24 NOTE — Patient Instructions (Addendum)
Visit [place], thank you for taking time to talk with me today. Please don't hesitate to contact me if I can be of assistance to you before our next scheduled telephone appointment.  Below are the goals we discussed today:  Patient Self-Care Activities: Patient Samantha [name] will: Take medications as prescribed Attend all scheduled provider appointments Call pharmacy for medication refills Call provider office for new concerns or questions Continue to check fasting (first thing in the morning, before eating) blood sugars at home Continue monitoring your weights and blood pressures at home Continue to follow heart healthy, low salt, low cholesterol, carbohydrate-modified, low sugar diet  Our next scheduled telephone follow up visit/ appointment with care management team member is scheduled on:  Tuesday, February 28, 2021 at 9:00 am  If you need to cancel or re-schedule our visit, please call [phone] and our care guide team will be happy to assist you.   I look forward to hearing about your progress.   Samantha Rack, RN, BSN, [place] [phone]: direct office  If you are experiencing a Mental Health or [place] or need someone to talk to, please  call the Suicide and Crisis Lifeline: 988 call the [place] National Suicide Prevention Lifeline: (585)411-0385 or TTY: 816-329-8662 TTY (269) 836-7020) to talk to a trained counselor call 1-800-273-TALK (toll free, 24 hour hotline) go to [facility] [phone]) call 911   Preventing Hypoglycemia Hypoglycemia occurs when the level of sugar (glucose) in the blood is too low. Hypoglycemia can happen in people who do or do not have diabetes (diabetes mellitus). It can develop quickly, and it can be a medical emergency. For most people with diabetes, a blood glucose level below 70 mg/dL  (3.9 mmol/L) is considered hypoglycemia. Glucose is a type of sugar that provides the body's main source of energy. Certain hormones (insulin and glucagon) control the level of glucose in the blood. Insulin lowers blood glucose, and glucagon increases blood glucose. Hypoglycemia can result from having too much insulin in the bloodstream, or from not eating enough food that contains glucose. Your risk for hypoglycemia is higher: If you take insulin or diabetes medicines to help lower your blood glucose or to help your body make more insulin. If you skip or delay a meal or snack. If you are ill. During and after exercise. You can prevent hypoglycemia by working with your health care provider to adjust your meal plan as needed and by taking other precautions. How can hypoglycemia affect me? Mild symptoms Mild hypoglycemia may not cause any symptoms. If you do have symptoms, they may include: Hunger. Sweating and feeling clammy. Dizziness or feeling light-headed. Sleepiness or restless sleep. Nausea. Increased heart rate. Headache. Blurry vision. Mood changes, including irritability or anxiety. Tingling or numbness around the mouth, lips, or tongue. If mild hypoglycemia is not recognized and treated, it can quickly become moderate or severe hypoglycemia. Moderate symptoms Moderate hypoglycemia can cause: Confusion and poor judgment. Behavior changes. Weakness. Irregular heartbeat. A change in coordination. Severe symptoms Severe hypoglycemia is a medical emergency. It can cause: Fainting. Seizures. Loss of consciousness (coma). Death. What nutrition changes can be made? Work with your health care provider or dietitian to make a healthy meal plan that is right for you. Follow your meal plan carefully. Eat meals at regular times. If recommended by your health care provider, have snacks between meals. Donot skip or  delay meals or snacks. You can be at risk for hypoglycemia if you are  not getting enough carbohydrates. What lifestyle changes can be made?  Work closely with your health care provider to manage your blood glucose. Make sure you know: Your goal blood glucose levels. How and when to check your blood glucose. The symptoms of hypoglycemia. It is important to treat hypoglycemia right away to keep it from becoming severe. Do not drink alcohol on an empty stomach. When you are ill, check your blood glucose more often than usual. Make a sick day plan in advance with your health care provider. Follow this plan whenever you cannot eat or drink normally. Always check your blood glucose before, during, and after exercise. How is this treated? This condition can often be treated by immediately eating or drinking something that contains sugar with 15 grams of fast-acting carbohydrate, such as: 4 oz (120 mL) of fruit juice. 4 oz (120 mL) of regular soda (not diet soda). Several pieces of hard candy. Check food labels to find out how many pieces to eat for 15 grams. 1 Tbsp (15 mL) of sugar or honey. 4 glucose tablets. 1 tube of glucose gel. Treating hypoglycemia if you have diabetes If you are alert and able to swallow safely, follow the 15:15 rule: Take 15 grams of a fast-acting carbohydrate. Talk with your health care provider about how much you should take. Fast-acting options include: Glucose tablets (take 4 tablets). Several pieces of hard candy. Check food labels to find out how many pieces to eat for 15 grams. 4 oz (120 mL) of fruit juice. 4 oz (120 mL) of regular soda (not diet soda). 1 Tbsp (15 mL) of sugar or honey. 1 tube of glucose gel. Check your blood glucose 15 minutes after you take the carbohydrate. If the repeat blood glucose level is still at or below 70 mg/dL (3.9 mmol/L), take 15 grams of a carbohydrate again. If your blood glucose level does not increase above 70 mg/dL (3.9 mmol/L) after 3 tries, seek emergency medical care. After your blood  glucose level returns to normal, eat a meal or a snack within 1 hour. Treating severe hypoglycemia Severe hypoglycemia is when your blood glucose level is below 54 mg/dL (3 mmol/L). Severe hypoglycemia is a medical emergency. Get medical help right away. If you have severe hypoglycemia and you cannot eat or drink, you may need glucagon. A family member or close friend should learn how to check your blood glucose and how to give you glucagon. Ask your health care provider if you need to have an emergency glucagon kit available. Severe hypoglycemia may need to be treated in a hospital. The treatment may include getting glucose through an IV. You may also need treatment for the cause of your hypoglycemia. Where to find more information American Diabetes Association: www.diabetes.[employer] of Diabetes and Digestive and Kidney Diseases: [employer] Association of Diabetes Care & Education Specialists: www.diabeteseducator.org Contact a health care provider if: You have problems keeping your blood glucose in your target range. You have frequent episodes of hypoglycemia. Get help right away if: You continue to have hypoglycemia symptoms after eating or drinking something containing glucose. Your blood glucose level is below 54 mg/dL (3 mmol/L). You faint. You have a seizure. These symptoms may represent a serious problem that is an emergency. Do not wait to see if the symptoms will go away. Get medical help right away. Call your local emergency services (911 in the [place] Do not  drive yourself to the hospital. Summary Know the symptoms of hypoglycemia and when you are at risk for it, such as during exercise or when you are sick. Check your blood glucose often when you are at risk for hypoglycemia. Hypoglycemia can develop quickly, and it can be dangerous if it is not treated right away. If you have a history of severe hypoglycemia, make sure your family or a close friend knows how to  use your glucagon kit. Make sure you know how to treat hypoglycemia. Keep a fast-acting carbohydrate option available when you may be at risk for hypoglycemia. This information is not intended to replace advice given to you by your health care provider. Make sure you discuss any questions you have with your health care provider. Document Revised: 12/17/2019 Document Reviewed: 12/17/2019 Elsevier Patient Education  2022 [employer].  Following is a copy of your full care plan:  Care Plan : RN Care Manager Plan of Care  Updates made by Samantha Royalty, RN since 01/24/2021 12:00 AM     Problem: Chronic Disease Management Needs   Priority: High     Long-Range Goal: Development of plan of care for long term chronic disease management   Start Date: 01/24/2021  Expected End Date: 01/24/2022  Priority: High  Note:   Current Barriers:  Chronic Disease Management support and education needs related to CHF and DMII Chronic pain- well managed with medications Caregiver for daughter, has cancer currently under control; patient's brother currently has cancer and patient is periodically his caregiver- brother lives in [place], [place]: 01/24/21- patient declines need for caregiver resources/ support  RNCM Clinical Goal(s):  Patient will demonstrate ongoing health management independence as evidenced by adherence to plan of care for CHF/ DMII        through collaboration with RN Care manager, provider, and care team.   Interventions: 1:1 collaboration with primary care provider regarding development and update of comprehensive plan of care as evidenced by provider attestation and co-signature Inter-disciplinary care team collaboration (see longitudinal plan of care) Evaluation of current treatment plan related to  self management and patient's adherence to plan as established by provider Initial assessment completed Discussed management of chronic pain: patient adherent to prescribed medications;  reports independently manages pain, prescribed medications "help" Discussed medications: patient independently self-manages and denies concerns around medications today Falls assessment completed: reports no falls x 12 months; occasionally uses cane; would like to have PCP order rollator walker-- has noticed over holidays that ambulation has become increasingly more challenging in setting of chronic pain: states shopping "about wore (her) out;" patient proactively verbalizes plans to discuss with PCP at upcoming scheduled provider office visit 02/21/21  Heart Failure Interventions:  (Status: New goal.)  Long Term Goal  Basic overview and discussion of pathophysiology of Heart Failure reviewed Provided education on low sodium diet Reviewed Heart Failure Action Plan in depth and provided written copy Assessed need for readable accurate scales in home Discussed importance of daily weight and advised patient to weigh and record daily Reviewed role of diuretics in prevention of fluid overload and management of heart failure Discussed the importance of keeping all appointments with provider Provided patient with education about the role of exercise in the management of heart failure Advised patient to discuss stated need for walker with provider Screening for signs and symptoms of depression related to chronic disease state  Assessed social determinant of health barriers Confirmed patient monitors/ records daily weights at home: she reports weighs "3-4 times per  week" and reports consistent weights between 190-195 lbs, with a recent weight of "195 lbs;" Discussed action plan for weight gain; she verbalizes a good general understanding of standard guideline to report weight gain > 3 lbs overnight/ 5 lbs in one week Discussed/ provided education around signs/ symptoms CHF yellow zone: she denies difficulty breathing, increased shortness of breath outside of baseline, lower extremity swelling Confirmed  active with cardiology provider: next scheduled visit March 2023 Discussed use of diuretics: patient has history of dehydration: takes diuretics every-other- day as prescribed; verbalizes very good baseline understanding of purpose of diuretics, as well as signs/ symptoms dehydration Confirmed patient monitors/ writes down on paper blood pressures at home: she reports consistent values at home between 140-170/ 80's: reports blood pressure last night of "162/88" Confirmed patient stays as active as possible: has exercise routine at home that she regularly completes: positive reinforcement provided with encouragement to continue efforts  Diabetes:  (Status: New goal.) Long Term Goal   Lab Results  Component Value Date   HGBA1C 6.5 10/20/2020  Assessed patient's understanding of A1c goal: <7% Provided education to patient about basic DM disease process; Reviewed prescribed diet with patient carbohydrate modified/ low sugar, heart healthy, low salt; Counseled on importance of regular laboratory monitoring as prescribed;        Discussed plans with patient for ongoing care management follow up and provided patient with direct contact information for care management team;      Provided patient with written educational materials related to hypo and hyperglycemia and importance of correct treatment;       Reviewed scheduled/upcoming provider appointments including: 02/21/21- PCP; 04/20/21- cardiology;         Review of patient status, including review of consultants reports, relevant laboratory and other test results, and medications completed;       Confirmed patient monitors/ writes down blood sugars at home QD fasting: reports consistent values between 80-110; report sone recent isolated low value of "69," with signs/ symptoms hypoglycemia- reports immediately drank OJ and "ate breakfast" this resolved hypoglycemia Provided education/ reinforced patient's baseline understanding of signs/ symptoms  hypoglycemia, along with corresponding action plan- patient will benefit from ongoing support/ reinforcement of same Discussed patient's report that "eating sugar makes (her) blood sugars go very low;" confirmed patient avoids eating large portions of sugar, and rarely eats sugar at all- reports good adherence to prescribed diet for DMII; CHF Discussed patient's report of (L) leg "sensitivity;" she reports that her "skin hurts and feels sore;" she denies swelling, wonders if this might be related to PVD/ PAD, vs. Neuropathy: encouraged her to discuss with PCP  Patient Goals/Self-Care Activities: As evidenced by review of EHR, collaboration with care team, and patient reporting during CCM RN CM outreach, Patient [place] will: Take medications as prescribed Attend all scheduled provider appointments Call pharmacy for medication refills Call provider office for new concerns or questions Continue to check fasting (first thing in the morning, before eating) blood sugars at home Continue monitoring your weights and blood pressures at home Continue to follow heart healthy, low salt, low cholesterol, carbohydrate-modified, low sugar diet       Consent to CCM Services: Ms. Kreiter was given information about Chronic Care Management services 01/09/21 including:  CCM service includes personalized support from designated clinical staff supervised by her physician, including individualized plan of care and coordination with other care providers 24/7 contact phone numbers for assistance for urgent and routine care needs. Service will only be billed when office  clinical staff spend 20 minutes or more in a month to coordinate care. Only one practitioner may furnish and bill the service in a calendar month. The patient may stop CCM services at any time (effective at the end of the month) by phone call to the office staff. The patient will be responsible for cost sharing (co-pay) of up to 20% of the service  fee (after annual deductible is met).  Patient agreed to services and verbal consent obtained.   Patient verbalizes understanding of instructions provided today and agrees to view in MyChart Telephone follow up appointment with care management team member scheduled for:  Tuesday, February 28, 2021 at 9:00 am The patient has been provided with contact information for the care management team and has been advised to call with any health related questions or concerns

## 2021-01-26 ENCOUNTER — Other Ambulatory Visit: Payer: Self-pay | Admitting: Internal Medicine

## 2021-01-27 ENCOUNTER — Encounter: Payer: Self-pay | Admitting: Orthopaedic Surgery

## 2021-01-27 ENCOUNTER — Ambulatory Visit: Payer: Medicare Other

## 2021-01-27 ENCOUNTER — Ambulatory Visit (INDEPENDENT_AMBULATORY_CARE_PROVIDER_SITE_OTHER): Payer: Medicare Other | Admitting: Orthopaedic Surgery

## 2021-01-27 ENCOUNTER — Other Ambulatory Visit: Payer: Self-pay

## 2021-01-27 VITALS — BP 134/83 | HR 67 | Ht 62.5 in | Wt 195.0 lb

## 2021-01-27 DIAGNOSIS — I2583 Coronary atherosclerosis due to lipid rich plaque: Secondary | ICD-10-CM | POA: Diagnosis not present

## 2021-01-27 DIAGNOSIS — I2510 Atherosclerotic heart disease of native coronary artery without angina pectoris: Secondary | ICD-10-CM | POA: Diagnosis not present

## 2021-01-27 DIAGNOSIS — M542 Cervicalgia: Secondary | ICD-10-CM

## 2021-01-27 MED ORDER — PREDNISONE 10 MG (21) PO TBPK: ORAL_TABLET | ORAL | 0 refills | Status: DC

## 2021-01-27 NOTE — Progress Notes (Signed)
Office Visit Note   Patient: Samantha [name]           Date of Birth: 03/03/1940           MRN: [id number] Visit Date: 01/27/2021              Requested by: [name], MD [place],  [place] [address] PCP: [name], MD   Assessment & Plan: Visit Diagnoses:  1. Neck pain     Plan: Patient needs urgent MRI scan to rule out cord compression with new onset of weakness and previous cervical fusion.  No evidence of pseudoarthrosis.  Will place on a prednisone pack she can continue the Tylenol and Lyrica.  Follow-up after cervical MRI.  She develops any recurrence of gait disturbance she needs to go to emergency room for emergent MRI.  Follow-Up Instructions: No follow-ups on file.   Orders:  Orders Placed This Encounter  Procedures   XR Cervical Spine 2 or 3 views   Meds ordered this encounter  Medications   predniSONE (STERAPRED UNI-PAK 21 TAB) 10 MG (21) TBPK tablet    Sig: Take 6,5,4,3,2,1 as instructed one tablet less each day with food.    Dispense:  21 tablet    Refill:  0      Procedures: No procedures performed   Clinical Data: No additional findings.   Subjective: Chief Complaint  Patient presents with   Neck - Pain    HPI 80 year old female had previous cervical fusion C6 3 to C6 done elsewhere with allograft and plate.  Patient has had some neck pain for 9 months got worse night before last she states she felt a pop in her neck and put her collar on she has had pain that radiates underneath her jaw pain with turning her head to the left and states she has noticed weakness in her left arm and then yesterday noticed some weakness in the right arm.  No history of stroke.  She is used Tylenol Lyrica with relief she takes the collar helps her symptoms.  She states 3 days ago she was having trouble walking and states she walked "like she was drunk".  Since that time she states she is actually walking better.  Review of Systems positive  for rheumatoid arthritis history of TIA previous lumbar decompression.  Previous cervical fusion.  All assisted noncontributory.   Objective: Vital Signs: BP 134/83    Pulse 67    Ht 5' 2.5" (1.588 m)    Wt 195 lb (88.5 kg)    BMI 35.10 kg/m   Physical Exam Constitutional:      Appearance: She is well-developed.  HENT:     Head: Normocephalic.     Right Ear: External ear normal.     Left Ear: External ear normal. There is no impacted cerumen.  Eyes:     Pupils: Pupils are equal, round, and reactive to light.  Neck:     Thyroid: No thyromegaly.     Trachea: No tracheal deviation.  Cardiovascular:     Rate and Rhythm: Normal rate.  Pulmonary:     Effort: Pulmonary effort is normal.  Abdominal:     Palpations: Abdomen is soft.  Musculoskeletal:     Cervical back: No rigidity.  Skin:    General: Skin is warm and dry.  Neurological:     Mental Status: She is alert and oriented to person, place, and time.  Psychiatric:  Behavior: Behavior normal.    Ortho Exam upper extremity reflexes are 2+ well-healed right side anterior cervical incision healed.  Patient has some brachial plexus tenderness both right and left some increased discomfort with cervical compression no improvement with cervical distraction.  No lower extremity clonus patient has 2-3+ symmetrical reflexes right and left upper and lower.  Specialty Comments:  No specialty comments available.  Imaging: No results found.   PMFS History: Patient Active Problem List   Diagnosis Date Noted   Rheumatoid arthritis ([place]) - Dr Dossie Der 10/20/2020   Neuropathy 10/19/2020   Difficulty urinating 04/14/2020   Vitamin D deficiency 04/13/2020   S/P TKR (total knee replacement), 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   Aortic atherosclerosis ([place]) 11/03/2019   Posterior vitreous detachment of right eye 07/15/2019   Posterior vitreous detachment of left eye 07/15/2019   Diabetes mellitus without complication ([place]) 83/15/1761   Recurrent  corneal erosion, 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   Raynaud phenomenon 05/06/2019   Trigger finger, left ring finger 03/18/2019   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 01/28/2018   Sacral back pain 10/16/2017   Chronic left upper quadrant pain 07/23/2017   Poor balance 03/14/2017   Angina pectoris ([place]) 03/22/2016   Hypertensive heart disease 07/07/2015   Essential hypertension 07/07/2015   Lightheadedness 07/07/2015   Plantar fasciitis, left 03/22/2015   Coronary artery disease due to lipid rich plaque 01/05/2015   Chronic diastolic CHF (congestive heart failure), NYHA class 2 ([place]) 01/05/2015   Malignant neoplasm of ascending colon  pT1, pN0, rM0 s/p robotic colectomy 11/11/2014 11/11/2014   Migraine (Ocular) 01/05/2014   VBI (vertebrobasilar insufficiency) 08/22/2012   TIA (transient ischemic attack) 06/27/2012   DJD (degenerative joint disease) 02/02/2011   Varicose veins of legs 06/06/2010   Palpitations 11/23/2008   UTI'S, RECURRENT 09/28/2008   Fibromyalgia 08/15/2007   DM II (diabetes mellitus, type II), w/ neuropathy 05/21/2006   Hyperlipidemia 05/21/2006   Asthma 01/29/2002   Past Medical History:  Diagnosis Date   A-fib ([place])    Abnormal CT of the chest 2008   last CT4-l 2009:  . No f/u suggested    Allergy    Asthma    CAD (coronary artery disease)    a. Coronary CTA 10/16: Coronary Ca score 211, mod non-obstructive CAD with LM mild plaque (25-50%), mid LAD 50-69%. b. Neg nuc 06/2015.   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[place]) 2016   Minimally invasive right hemicolectomy to be done    Neuromuscular  disorder (HCC)    FIBROMYALGIA   Ocular migraine    OSA (obstructive sleep apnea) 09/2007   dx w/ a sleep study, not on  CPAP   Osteoarthritis    Osteoporosis    Pneumonia    "double" in 2004   PONV (postoperative nausea and vomiting)    Reactive airway disease 01/29/2002   dx of pseudoasthma / vcd in 2005 and nl sprirometry History of dyspnea, 2011,  improved after several medications were changed around Question of COPD, disproved July 06, 2009 with nl pft's  Rheumatoid factor positive    Shingles 11/2009   Sleep apnea    Stroke (HCC)    TIA (transient ischemic attack)    x2 - on Plavix for this   Torn rotator cuff    right worse than left, both are torn   Tumor, 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   Type II diabetes mellitus ([facility])    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   Vaginal dysplasia     Family History  Problem Relation Age of Onset   Heart disease Father    Heart disease Mother    Lung cancer Mother    Allergies Sister    Parkinsonism Sister        possible   Asthma Sister    Asthma Paternal Grandmother    Stroke Paternal Grandmother    Heart disease Other        paternal grandparents, maternal grandparents,    Heart disease Brother    Emphysema Brother    Aneurysm Brother        x3   Kidney failure Brother    Diabetes Brother    Diabetes Brother    Stroke Brother    Stroke Maternal Grandmother    Breast cancer Neg Hx    Colon cancer Neg Hx    Heart attack Neg Hx    Esophageal cancer Neg Hx    Rectal cancer Neg Hx    Stomach cancer Neg Hx     Past Surgical History:  Procedure Laterality Date   ABDOMINAL HYSTERECTOMY  1980   NO oophorectomy per pt    ANTERIOR CERVICAL DECOMP/DISCECTOMY FUSION  2001   C 3, C4 and C5 plate and screws   BREAST BIOPSY Right 1999   BUNIONECTOMY Left ~ [facility]     2018 By Dr. Pernell Dupre (done after colon surgery)   CATARACT EXTRACTION W/ INTRAOCULAR LENS  IMPLANT, BILATERAL  2012   COLON  SURGERY  10/2014   EYE SURGERY Bilateral    torq lens for cataracts   LEFT HEART CATH AND CORONARY ANGIOGRAPHY N/A 03/22/2016   Procedure: Left Heart Cath and Coronary Angiography;  Surgeon: Belva Crome, MD;  Location: [facility];  Service: Cardiovascular;  Laterality: N/A;   LUMBAR LAMINECTOMY/DECOMPRESSION MICRODISCECTOMY N/A 01/20/2018   Procedure: L4-5 decompression;  Surgeon: Marybelle Killings, MD;  Location: [facility];  Service: Orthopedics;  Laterality: N/A;   THYROIDECTOMY, PARTIAL  1960's   TOTAL KNEE ARTHROPLASTY Right 12/04/2019   Procedure: RIGHT TOTAL KNEE ARTHROPLASTY;  Surgeon: Marybelle Killings, MD;  Location: [facility];  Service: Orthopedics;  Laterality: Right;  RNFA APRIL [facility]   "Laser surgery for vaginal cancer; followed by chemotherapy" (06/27/2012)   Social History   Occupational History   Occupation: Retired, disable since 2000    Employer: RETIRED  Tobacco Use   Smoking status: Former    Packs/day: 0.25    Years: 5.00    Pack years: 1.25    Types: Cigarettes    Quit date: 01/29/1998    Years since quitting: 23.0   Smokeless tobacco: Never   Tobacco comments:    Quit in 2001  Vaping Use   Vaping Use: Never used  Substance and Sexual Activity   Alcohol use: No    Alcohol/week: 0.0 standard drinks   Drug use: No   Sexual activity: Never

## 2021-01-28 DIAGNOSIS — Z7984 Long term (current) use of oral hypoglycemic drugs: Secondary | ICD-10-CM | POA: Diagnosis not present

## 2021-01-28 DIAGNOSIS — I509 Heart failure, unspecified: Secondary | ICD-10-CM

## 2021-01-28 DIAGNOSIS — I110 Hypertensive heart disease with heart failure: Secondary | ICD-10-CM

## 2021-01-28 DIAGNOSIS — E1159 Type 2 diabetes mellitus with other circulatory complications: Secondary | ICD-10-CM | POA: Diagnosis not present

## 2021-02-03 ENCOUNTER — Ambulatory Visit (INDEPENDENT_AMBULATORY_CARE_PROVIDER_SITE_OTHER): Payer: Medicare Other | Admitting: *Deleted

## 2021-02-03 ENCOUNTER — Encounter: Payer: Self-pay | Admitting: *Deleted

## 2021-02-03 DIAGNOSIS — I10 Essential (primary) hypertension: Secondary | ICD-10-CM

## 2021-02-03 DIAGNOSIS — M797 Fibromyalgia: Secondary | ICD-10-CM

## 2021-02-03 DIAGNOSIS — I5032 Chronic diastolic (congestive) heart failure: Secondary | ICD-10-CM

## 2021-02-03 DIAGNOSIS — E1142 Type 2 diabetes mellitus with diabetic polyneuropathy: Secondary | ICD-10-CM

## 2021-02-03 NOTE — Chronic Care Management (AMB) (Signed)
Chronic Care Management   CCM RN Visit Note  02/03/2021 Name: Samantha [name] MRN: [id number] DOB: 08/19/1940  Subjective: Samantha [name] is a 81 y.o. year old female who is a primary care patient of Burns, Claudina Lick, MD. The care management team was consulted for assistance with disease management and care coordination needs.    Engaged with patient by telephone for  acute/ unscheduled telephone outreach  in response to provider referral for case management and/or care coordination services.   Consent to Services:  The patient was given information about Chronic Care Management services, agreed to services, and gave verbal consent prior to initiation of services.  Please see initial visit note for detailed documentation.  Patient agreed to services and verbal consent obtained.   Assessment: Review of patient past medical history, allergies, medications, health status, including review of consultants reports, laboratory and other test data, was performed as part of comprehensive evaluation and provision of chronic care management services.   CCM Care Plan Allergies  Allergen Reactions   Bactrim [Sulfamethoxazole-Trimethoprim] Other (See Comments)    See OV 09-15-13, rash-tongue swelling due to bactrim ?   Cefuroxime Axetil Hives   Oxycodone Nausea And Vomiting   Pravastatin Other (See Comments)    "muscle breakdown" with profuse sweating   Seldane [Terfenadine] Hives   Zocor [Simvastatin] Other (See Comments)    2012 "Muscle breakdown " with profuse sweating   Tramadol Nausea And Vomiting   Atorvastatin Other (See Comments)    Pt reports muscle aches and joint pains   Cefuroxime Other (See Comments)   Lactose Intolerance (Gi) Diarrhea   Latex     blisters   Lime Flavor [Flavoring Agent] Diarrhea   Metformin And Related Diarrhea   Oxycodone Hcl Other (See Comments)   Pravastatin Sodium Other (See Comments)   Rosuvastatin Other (See Comments)    Pt reports "myalgias,  fatigue, nosebleeds."   Rosuvastatin Calcium Other (See Comments)   Tape Other (See Comments)    blisters   Outpatient Encounter Medications as of 02/03/2021  Medication Sig   acetaminophen (TYLENOL) 500 MG tablet Take 500 mg by mouth every 6 (six) hours as needed for mild pain.    albuterol (PROVENTIL) (2.5 MG/3ML) 0.083% nebulizer solution Take 3 mLs (2.5 mg total) by nebulization every 6 (six) hours as needed for wheezing or shortness of breath.   Calcium-Magnesium-Zinc (CAL-MAG-ZINC PO) Take 1 tablet by mouth daily.   cetirizine (ZYRTEC) 10 MG tablet TAKE 1 TABLET(10 MG) BY MOUTH DAILY   cholecalciferol (VITAMIN D) 1000 UNITS tablet Take 1,000 Units by mouth every morning.    clopidogrel (PLAVIX) 75 MG tablet TAKE 1 TABLET(75 MG) BY MOUTH DAILY   dapagliflozin propanediol (FARXIGA) 10 MG TABS tablet Take 1 tablet (10 mg total) by mouth daily before breakfast.   famotidine (PEPCID) 40 MG tablet TAKE 1 TABLET(40 MG) BY MOUTH DAILY AS NEEDED FOR HEARTBURN OR INDIGESTION   glucose blood test strip Use as instructed   hydroxychloroquine (PLAQUENIL) 200 MG tablet Take 400 mg by mouth daily.    isosorbide mononitrate (IMDUR) 30 MG 24 hr tablet Take 3 tablets (90 mg total) by mouth daily.   Lancets (ONETOUCH ULTRASOFT) lancets Use as instructed   losartan (COZAAR) 25 MG tablet TAKE 1 TABLET(25 MG) BY MOUTH DAILY   nebivolol (BYSTOLIC) 10 MG tablet TAKE 1 TABLET(10 MG) BY MOUTH DAILY   nitroGLYCERIN (NITROSTAT) 0.4 MG SL tablet DISSOLVE 1 TABLET UNDER THE TONGUE EVERY 5 MINUTES AS NEEDED FOR CHEST  PAIN   Polyethyl Glycol-Propyl Glycol (SYSTANE) 0.4-0.3 % GEL ophthalmic gel Place 1 application into both eyes daily as needed (for dry eyes).    predniSONE (STERAPRED UNI-PAK 21 TAB) 10 MG (21) TBPK tablet Take 6,5,4,3,2,1 as instructed one tablet less each day with food.   pregabalin (LYRICA) 75 MG capsule TAKE 1 CAPSULE(75 MG) BY MOUTH AT BEDTIME   psyllium (METAMUCIL) 58.6 % powder Take 1 packet by  mouth daily.   ranolazine (RANEXA) 500 MG 12 hr tablet Take 1 tablet (500 mg total) by mouth 2 (two) times daily.   REPATHA SURECLICK 650 MG/ML SOAJ INJECT 1 PEN UNDER THE SKIN EVERY 14 DAYS   SYMBICORT 80-4.5 MCG/ACT inhaler INHALE 2 PUFFS INTO THE LUNGS TWICE DAILY   torsemide (DEMADEX) 20 MG tablet Take one tablet by mouth every other day   VENTOLIN HFA 108 (90 Base) MCG/ACT inhaler INHALE 2 PUFFS INTO THE LUNGS EVERY 6 HOURS AS NEEDED FOR WHEEZING OR SHORTNESS OF BREATH   No facility-administered encounter medications on file as of 02/03/2021.   Patient Active Problem List   Diagnosis Date Noted   Rheumatoid arthritis ([place]) - Dr Dossie Der 10/20/2020   Neuropathy 10/19/2020   Difficulty urinating 04/14/2020   Vitamin D deficiency 04/13/2020   S/P TKR (total knee replacement), 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   Aortic atherosclerosis ([place]) 11/03/2019   Posterior vitreous detachment of right eye 07/15/2019   Posterior vitreous detachment of left eye 07/15/2019   Diabetes mellitus without complication ([place]) 35/46/5681   Recurrent corneal erosion, 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   Raynaud phenomenon 05/06/2019   Trigger finger, left ring finger 03/18/2019   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 01/28/2018   Sacral back pain 10/16/2017   Chronic left upper quadrant pain 07/23/2017   Poor balance 03/14/2017   Angina pectoris ([place]) 03/22/2016   Hypertensive heart disease 07/07/2015   Essential hypertension 07/07/2015   Lightheadedness 07/07/2015   Plantar fasciitis, left 03/22/2015   Coronary artery disease due to lipid rich plaque 01/05/2015   Chronic diastolic CHF (congestive heart failure), NYHA class 2 ([place]) 01/05/2015   Malignant neoplasm of ascending colon  pT1, pN0, 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 s/p robotic colectomy 11/11/2014 11/11/2014   Migraine (Ocular) 01/05/2014   VBI (vertebrobasilar insufficiency) 08/22/2012   TIA (transient ischemic attack) 06/27/2012   DJD (degenerative joint disease)  02/02/2011   Varicose veins of legs 06/06/2010   Palpitations 11/23/2008   UTI'S, RECURRENT 09/28/2008   Fibromyalgia 08/15/2007   DM II (diabetes mellitus, type II), w/ neuropathy 05/21/2006   Hyperlipidemia 05/21/2006   Asthma 01/29/2002   Conditions to be addressed/monitored:  CHF, HTN, DMII, and chronic pain/ fibromyalgia  Care Plan : RN Care Manager Plan of Care  Updates made by Knox Royalty, RN since 02/03/2021 12:00 AM     Problem: Chronic Disease Management Needs   Priority: High     Long-Range Goal: Development of plan of care for long term chronic disease management   Start Date: 01/24/2021  Expected End Date: 01/24/2022  Priority: High  Note:   Current Barriers:  Chronic Disease Management support and education needs related to CHF and DMII Chronic pain- well managed with medications Caregiver for daughter, has cancer currently under control; patient's brother currently has cancer and patient is periodically his caregiver- brother lives in [place], [place]: 01/24/21- patient declines need for caregiver resources/ support  RNCM Clinical Goal(s):  Patient will demonstrate ongoing health management independence as evidenced by adherence to plan of care for CHF/ DMII  through collaboration with Consulting civil engineer, provider, and care team.   Interventions: 1:1 collaboration with primary care provider regarding development and update of comprehensive plan of care as evidenced by provider attestation and co-signature Inter-disciplinary care team collaboration (see longitudinal plan of care) Evaluation of current treatment plan related to  self management and patient's adherence to plan as established by provider Initial assessment completed Discussed management of chronic pain: patient adherent to prescribed medications; reports independently manages pain, prescribed medications "help" Discussed medications: patient independently self-manages and denies concerns around  medications today Falls assessment completed: reports no falls x 12 months; occasionally uses cane; would like to have PCP order rollator walker-- has noticed over holidays that ambulation has become increasingly more challenging in setting of chronic pain: states shopping "about wore (her) out;" patient proactively verbalizes plans to discuss with PCP at upcoming scheduled provider office visit 02/21/21  02/03/21: Acute/ unscheduled outreach/ voicemail from patient; returned patient's call; she reports significantly worsened posterior neck pain; states she visited orthopedic provider as scheduled 01/27/21; will be having MRI Tuesday 02/07/21 and follow up office visit Dr. Lorin Mercy 02/14/21- reports she just wanted to call "to keep Dr. Quay Burow updated;" reports has had difficulty walking since her neck pain has increased.  Confirms she is taking prednisone as prescribed after office visit 01/27/21 with Dr. Lorin Mercy Discussed plan of action to take over weekend should her pain worsen, including go to [facility]/ ED- she reports this is what orthopedic provider reccommended as well Confirmed patient is being conservative in activity- this was encouraged Confirmed no recent/ new falls, continues using cane as indicated, but reports difficulty "picking things up" so is just trying to "stay still" as much as possible; previously provided education around falls prevention reinforced Re-confirms her intent to ask Dr. Quay Burow about obtaining rollator walker at time of scheduled office visit 02/21/21- this was encouraged confirmed for patient that Dr. Quay Burow had been made aware of this request Confirmed patient's daughter will be providing transportation to upcoming scheduled MRI/ Orthopedic provider visits and PCP visit Encouraged patient to keep orthopedic provider updated for any changes/ worsening in pain  From initial outreach 01/24/21:   Heart Failure Interventions:  (Status: New goal.)  Long Term Goal  Basic overview and  discussion of pathophysiology of Heart Failure reviewed Provided education on low sodium diet Reviewed Heart Failure Action Plan in depth and provided written copy Assessed need for readable accurate scales in home Discussed importance of daily weight and advised patient to weigh and record daily Reviewed role of diuretics in prevention of fluid overload and management of heart failure Discussed the importance of keeping all appointments with provider Provided patient with education about the role of exercise in the management of heart failure Advised patient to discuss stated need for walker with provider Screening for signs and symptoms of depression related to chronic disease state  Assessed social determinant of health barriers Confirmed patient monitors/ records daily weights at home: she reports weighs "3-4 times per week" and reports consistent weights between 190-195 lbs, with a recent weight of "195 lbs;" Discussed action plan for weight gain; she verbalizes a good general understanding of standard guideline to report weight gain > 3 lbs overnight/ 5 lbs in one week Discussed/ provided education around signs/ symptoms CHF yellow zone: she denies difficulty breathing, increased shortness of breath outside of baseline, lower extremity swelling Confirmed active with cardiology provider: next scheduled visit March 2023 Discussed use of diuretics: patient has history of dehydration: takes diuretics  every-other- day as prescribed; verbalizes very good baseline understanding of purpose of diuretics, as well as signs/ symptoms dehydration Confirmed patient monitors/ writes down on paper blood pressures at home: she reports consistent values at home between 140-170/ 80's: reports blood pressure last night of "162/88" Confirmed patient stays as active as possible: has exercise routine at home that she regularly completes: positive reinforcement provided with encouragement to continue  efforts  Diabetes:  (Status: New goal.) Long Term Goal   Lab Results  Component Value Date   HGBA1C 6.5 10/20/2020  Assessed patient's understanding of A1c goal: <7% Provided education to patient about basic DM disease process; Reviewed prescribed diet with patient carbohydrate modified/ low sugar, heart healthy, low salt; Counseled on importance of regular laboratory monitoring as prescribed;        Discussed plans with patient for ongoing care management follow up and provided patient with direct contact information for care management team;      Provided patient with written educational materials related to hypo and hyperglycemia and importance of correct treatment;       Reviewed scheduled/upcoming provider appointments including: 02/21/21- PCP; 04/20/21- cardiology;         Review of patient status, including review of consultants reports, relevant laboratory and other test results, and medications completed;       Confirmed patient monitors/ writes down blood sugars at home QD fasting: reports consistent values between 80-110; report sone recent isolated low value of "69," with signs/ symptoms hypoglycemia- reports immediately drank OJ and "ate breakfast" this resolved hypoglycemia Provided education/ reinforced patient's baseline understanding of signs/ symptoms hypoglycemia, along with corresponding action plan- patient will benefit from ongoing support/ reinforcement of same Discussed patient's report that "eating sugar makes (her) blood sugars go very low;" confirmed patient avoids eating large portions of sugar, and rarely eats sugar at all- reports good adherence to prescribed diet for DMII; CHF Discussed patient's report of (L) leg "sensitivity;" she reports that her "skin hurts and feels sore;" she denies swelling, wonders if this might be related to PVD/ PAD, vs. Neuropathy: encouraged her to discuss with PCP  Patient Goals/Self-Care Activities: As evidenced by review of EHR,  collaboration with care team, and patient reporting during CCM RN CM outreach, Patient Samantha [name] will: Take medications as prescribed Attend all scheduled provider appointments Call pharmacy for medication refills Call provider office for new concerns or questions Continue to check fasting (first thing in the morning, before eating) blood sugars at home Continue monitoring your weights and blood pressures at home Continue to follow heart healthy, low salt, low cholesterol, carbohydrate-modified, low sugar diet       Plan: Telephone follow up appointment with care management team member scheduled for:  Tuesday, February 28, 2021 at 9:00 am The patient has been provided with contact information for the care management team and has been advised to call with any health related questions or concerns  Oneta Rack, RN, BSN, [place] [phone]: direct office

## 2021-02-05 ENCOUNTER — Other Ambulatory Visit: Payer: Self-pay | Admitting: Internal Medicine

## 2021-02-07 DIAGNOSIS — M542 Cervicalgia: Secondary | ICD-10-CM | POA: Diagnosis not present

## 2021-02-14 ENCOUNTER — Encounter: Payer: Self-pay | Admitting: Orthopaedic Surgery

## 2021-02-14 ENCOUNTER — Other Ambulatory Visit: Payer: Self-pay

## 2021-02-14 ENCOUNTER — Ambulatory Visit (INDEPENDENT_AMBULATORY_CARE_PROVIDER_SITE_OTHER): Payer: Medicare Other | Admitting: Orthopaedic Surgery

## 2021-02-14 DIAGNOSIS — M4802 Spinal stenosis, cervical region: Secondary | ICD-10-CM | POA: Diagnosis not present

## 2021-02-16 DIAGNOSIS — M4802 Spinal stenosis, cervical region: Secondary | ICD-10-CM | POA: Insufficient documentation

## 2021-02-16 NOTE — Progress Notes (Signed)
Office Visit Note   Patient: Samantha [name]           Date of Birth: [birth year]           MRN: [id number] Visit Date: 02/14/2021              Requested by: [name], MD [place],  [place] [id number] PCP: [name], MD   Assessment & Plan: Visit Diagnoses:  1. Spinal stenosis of cervical region     Plan: Patient has moderate stenosis at C6-7 below her solid fusion.  No abnormal cord signal.  We will set up for some home cervical traction check her back in 4 weeks.  She can use this intermittently for relief.  If her symptoms do not settle down then we can discussed operative intervention at the C6-7 level with likely 0P implant.  Follow-Up Instructions: Return in about 4 weeks (around 03/14/2021).   Orders:  No orders of the defined types were placed in this encounter.  No orders of the defined types were placed in this encounter.     Procedures: No procedures performed   Clinical Data: No additional findings.   Subjective: Chief Complaint  Patient presents with   Neck - Pain, Follow-up    MRI review    HPI 81 year old female returns with ongoing severe neck pain with radicular symptoms left side of her head and her left arm and down to middle finger left hand.  She had previous fusion C3-C6 anteriorly with allograft and plate which is solid.  She is used Tylenol, Lyrica, prednisone pack without relief.  She gives a history of rheumatoid arthritis.  Previous lumbar decompression.  MRI scan has been obtained due to her weakness problems.  This shows moderate stenosis at C6-7 below her fusion and some mild to moderate foraminal stenosis at C2-3 above her solid fusion.  Patient states she still sometimes walks like she is drunk.  Review of Systems systems updated unchanged from 01/27/2021 office visit.   Objective: Vital Signs: BP (!) 150/79    Pulse 74    Ht 5' 2.5" (1.588 m)    Wt 195 lb (88.5 kg)    BMI 35.10 kg/m   Physical  Exam Constitutional:      Appearance: She is well-developed.  HENT:     Head: Normocephalic.     Right Ear: External ear normal.     Left Ear: External ear normal. There is no impacted cerumen.  Eyes:     Pupils: Pupils are equal, round, and reactive to light.  Neck:     Thyroid: No thyromegaly.     Trachea: No tracheal deviation.  Cardiovascular:     Rate and Rhythm: Normal rate.  Pulmonary:     Effort: Pulmonary effort is normal.  Abdominal:     Palpations: Abdomen is soft.  Musculoskeletal:     Cervical back: No rigidity.  Skin:    General: Skin is warm and dry.  Neurological:     Mental Status: She is alert and oriented to person, place, and time.  Psychiatric:        Behavior: Behavior normal.    Ortho Exam well-healed neck incision anteriorly.  Brachial plexus tenderness on the left.  She gets relief with cervical distraction increased pain with compression.  No lower extremity clonus normal heel toe gait.  Reflexes are 2+ and symmetrical.  Specialty Comments:  No specialty comments available.  Imaging: No results found.   [place]  History: Patient Active Problem List   Diagnosis Date Noted   Spinal stenosis of cervical region 02/16/2021   Rheumatoid arthritis ([place]) - Dr Dossie Der 10/20/2020   Neuropathy 10/19/2020   Difficulty urinating 04/14/2020   Vitamin D deficiency 04/13/2020   S/P TKR (total knee replacement), 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   Aortic atherosclerosis ([place]) 11/03/2019   Posterior vitreous detachment of right eye 07/15/2019   Posterior vitreous detachment of left eye 07/15/2019   Diabetes mellitus without complication ([place]) 60/45/4098   Recurrent corneal erosion, 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   Raynaud phenomenon 05/06/2019   Trigger finger, left ring finger 03/18/2019   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 01/28/2018   Sacral back pain 10/16/2017   Chronic left upper quadrant pain 07/23/2017   Poor balance 03/14/2017   Angina  pectoris ([place]) 03/22/2016   Hypertensive heart disease 07/07/2015   Essential hypertension 07/07/2015   Lightheadedness 07/07/2015   Plantar fasciitis, left 03/22/2015   Coronary artery disease due to lipid rich plaque 01/05/2015   Chronic diastolic CHF (congestive heart failure), NYHA class 2 ([place]) 01/05/2015   Malignant neoplasm of ascending colon  pT1, pN0, rM0 s/p robotic colectomy 11/11/2014 11/11/2014   Migraine (Ocular) 01/05/2014   VBI (vertebrobasilar insufficiency) 08/22/2012   TIA (transient ischemic attack) 06/27/2012   DJD (degenerative joint disease) 02/02/2011   Varicose veins of legs 06/06/2010   Palpitations 11/23/2008   UTI'S, RECURRENT 09/28/2008   Fibromyalgia 08/15/2007   DM II (diabetes mellitus, type II), w/ neuropathy 05/21/2006   Hyperlipidemia 05/21/2006   Asthma 01/29/2002   Past Medical History:  Diagnosis Date   A-fib ([place])    Abnormal CT of the chest 2008   last CT4-l 2009:  . No f/u suggested    Allergy    Asthma    CAD (coronary artery disease)    a. Coronary CTA 10/16: Coronary Ca score 211, mod non-obstructive CAD with LM mild plaque (25-50%), mid LAD 50-69%. b. Neg nuc 06/2015.   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[place]) 2016   Minimally invasive right hemicolectomy to be done    Neuromuscular disorder ([place])    FIBROMYALGIA   Ocular migraine    OSA (obstructive sleep apnea) 09/2007   dx w/ a sleep study, not on  CPAP   Osteoarthritis    Osteoporosis    Pneumonia    "double" in 2004   PONV (postoperative nausea and vomiting)    Reactive airway disease 01/29/2002   dx of pseudoasthma / vcd in 2005 and nl  sprirometry History of dyspnea, 2011,  improved after several medications were changed around Question of COPD, disproved July 06, 2009 with nl pft's       Rheumatoid factor positive    Shingles 11/2009   Sleep apnea    Stroke ([place])    TIA (transient ischemic attack)    x2 - on Plavix for this   Torn rotator cuff    right worse than left, both are torn   Tumor, 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   Type II diabetes  mellitus ([place])    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   Vaginal dysplasia     Family History  Problem Relation Age of Onset   Heart disease Father    Heart disease Mother    Lung cancer Mother    Allergies Sister    Parkinsonism Sister        possible   Asthma Sister    Asthma Paternal Grandmother    Stroke Paternal Grandmother    Heart disease Other        paternal grandparents, maternal grandparents,    Heart disease Brother    Emphysema Brother    Aneurysm Brother        x3   Kidney failure Brother    Diabetes Brother    Diabetes Brother    Stroke Brother    Stroke Maternal Grandmother    Breast cancer Neg Hx    Colon cancer Neg Hx    Heart attack Neg Hx    Esophageal cancer Neg Hx    Rectal cancer Neg Hx    Stomach cancer Neg Hx     Past Surgical History:  Procedure Laterality Date   ABDOMINAL HYSTERECTOMY  1980   NO oophorectomy per pt    ANTERIOR CERVICAL DECOMP/DISCECTOMY FUSION  2001   C 3, C4 and C5 plate and screws   BREAST BIOPSY Right 1999   BUNIONECTOMY Left ~ [facility]     2018 By Dr. Pernell Dupre (done after colon surgery)   CATARACT EXTRACTION W/ INTRAOCULAR LENS  IMPLANT, BILATERAL  2012   COLON SURGERY  10/2014   EYE SURGERY Bilateral    torq lens for cataracts   LEFT HEART CATH AND CORONARY ANGIOGRAPHY N/A 03/22/2016   Procedure: Left Heart Cath and Coronary Angiography;  Surgeon: Belva Crome, MD;  Location: [facility];  Service: Cardiovascular;  Laterality: N/A;   LUMBAR LAMINECTOMY/DECOMPRESSION  MICRODISCECTOMY N/A 01/20/2018   Procedure: L4-5 decompression;  Surgeon: Marybelle Killings, MD;  Location: [facility];  Service: Orthopedics;  Laterality: N/A;   THYROIDECTOMY, PARTIAL  1960's   TOTAL KNEE ARTHROPLASTY Right 12/04/2019   Procedure: RIGHT TOTAL KNEE ARTHROPLASTY;  Surgeon: Marybelle Killings, MD;  Location: [facility];  Service: Orthopedics;  Laterality: Right;  RNFA APRIL [place]   "Laser surgery for vaginal cancer; followed by chemotherapy" (06/27/2012)   Social History   Occupational History   Occupation: Retired, disable since 2000    Employer: RETIRED  Tobacco Use   Smoking status: Former    Packs/day: 0.25    Years: 5.00    Pack years: 1.25    Types: Cigarettes    Quit date: 01/29/1998    Years since quitting: 23.0   Smokeless tobacco: Never   Tobacco comments:    Quit in 2001  Vaping Use   Vaping Use: Never used  Substance and Sexual Activity   Alcohol use: No    Alcohol/week: 0.0 standard drinks   Drug use: No   Sexual activity: Never

## 2021-02-17 ENCOUNTER — Telehealth: Payer: Self-pay | Admitting: Orthopaedic Surgery

## 2021-02-17 DIAGNOSIS — M4802 Spinal stenosis, cervical region: Secondary | ICD-10-CM

## 2021-02-17 NOTE — Telephone Encounter (Signed)
Referral entered  

## 2021-02-17 NOTE — Telephone Encounter (Signed)
Pt called asking for a referral being sent to [facility]ist for Dr. Evelena Leyden. Please call pt at [phone].

## 2021-02-17 NOTE — Telephone Encounter (Signed)
Please advise 

## 2021-02-17 NOTE — Telephone Encounter (Signed)
I called patient and advised. 

## 2021-02-20 ENCOUNTER — Encounter: Payer: Self-pay | Admitting: Internal Medicine

## 2021-02-20 DIAGNOSIS — N183 Chronic kidney disease, stage 3 unspecified: Secondary | ICD-10-CM | POA: Insufficient documentation

## 2021-02-20 DIAGNOSIS — N1832 Chronic kidney disease, stage 3b: Secondary | ICD-10-CM | POA: Insufficient documentation

## 2021-02-20 NOTE — Patient Instructions (Addendum)
° ° °  Blood work was ordered.     Medications changes include :   increase lyrica to twice daily after several days you can take three times a day.  Take symbicort twice daily every day.   Your prescription(s) have been submitted to your pharmacy. Please take as directed and contact our office if you believe you are having problem(s) with the medication(s).    Please followup in 6 months

## 2021-02-20 NOTE — Progress Notes (Signed)
Subjective:    Patient ID: Samantha [name], female    DOB: 01-Jan-1941, 81 y.o.   MRN: [id number]   This visit occurred during the SARS-CoV-2 public health emergency.  Safety protocols were in place, including screening questions prior to the visit, additional usage of staff PPE, and extensive cleaning of exam room while observing appropriate contact time as indicated for disinfecting solutions.    HPI She is here for follow up of her chronic medical conditions -  htn, asthma, DM, CKD, neuropathy, RA, hld, vit d def    BP varies depending on pain.     Medications and allergies reviewed with patient and updated if appropriate.  Patient Active Problem List   Diagnosis Date Noted   CKD (chronic kidney disease) stage 3, GFR 30-59 ml/min ([place]) 02/20/2021   Spinal stenosis of cervical region 02/16/2021   Rheumatoid arthritis ([place]) - Dr Dossie Der 10/20/2020   Neuropathy 10/19/2020   Vitamin D deficiency 04/13/2020   S/P TKR (total knee replacement), 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   Aortic atherosclerosis ([place]) 11/03/2019   Posterior vitreous detachment of right eye 07/15/2019   Posterior vitreous detachment of left eye 07/15/2019   Diabetes mellitus without complication ([place]) 15/72/6203   Recurrent corneal erosion, 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   Raynaud phenomenon 05/06/2019   Trigger finger, left ring finger 03/18/2019   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 01/28/2018   Sacral back pain 10/16/2017   Chronic left upper quadrant pain 07/23/2017   Poor balance 03/14/2017   Angina pectoris ([place]) 03/22/2016   Hypertensive heart disease 07/07/2015   Essential hypertension 07/07/2015   Lightheadedness 07/07/2015   Plantar fasciitis, left 03/22/2015   Coronary artery disease due to lipid rich plaque 01/05/2015   Chronic diastolic CHF (congestive heart failure), NYHA class 2 ([place]) 01/05/2015   Malignant neoplasm of ascending colon  pT1, pN0, 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 s/p robotic colectomy  11/11/2014 11/11/2014   Migraine (Ocular) 01/05/2014   VBI (vertebrobasilar insufficiency) 08/22/2012   TIA (transient ischemic attack) 06/27/2012   DJD (degenerative joint disease) 02/02/2011   Varicose veins of legs 06/06/2010   Palpitations 11/23/2008   UTI'S, RECURRENT 09/28/2008   Fibromyalgia 08/15/2007   DM II (diabetes mellitus, type II), w/ neuropathy 05/21/2006   Hyperlipidemia 05/21/2006   Asthma 01/29/2002    Current Outpatient Medications on File Prior to Visit  Medication Sig Dispense Refill   acetaminophen (TYLENOL) 500 MG tablet Take 500 mg by mouth every 6 (six) hours as needed for mild pain.      albuterol (PROVENTIL) (2.5 MG/3ML) 0.083% nebulizer solution Take 3 mLs (2.5 mg total) by nebulization every 6 (six) hours as needed for wheezing or shortness of breath. 150 mL 0   Calcium-Magnesium-Zinc (CAL-MAG-ZINC PO) Take 1 tablet by mouth daily.     cetirizine (ZYRTEC) 10 MG tablet TAKE 1 TABLET(10 MG) BY MOUTH DAILY 90 tablet 3   cholecalciferol (VITAMIN D) 1000 UNITS tablet Take 1,000 Units by mouth every morning.      clopidogrel (PLAVIX) 75 MG tablet TAKE 1 TABLET(75 MG) BY MOUTH DAILY 90 tablet 1   dapagliflozin propanediol (FARXIGA) 10 MG TABS tablet Take 1 tablet (10 mg total) by mouth daily before breakfast. 30 tablet 5   famotidine (PEPCID) 40 MG tablet TAKE 1 TABLET(40 MG) BY MOUTH DAILY AS NEEDED FOR HEARTBURN OR INDIGESTION 30 tablet 5   glucose blood test strip Use as instructed 100 each 12   hydroxychloroquine (PLAQUENIL) 200 MG tablet Take 400 mg by mouth  daily.      isosorbide mononitrate (IMDUR) 30 MG 24 hr tablet Take 3 tablets (90 mg total) by mouth daily. 270 tablet 1   Lancets (ONETOUCH ULTRASOFT) lancets Use as instructed 100 each 12   losartan (COZAAR) 25 MG tablet TAKE 1 TABLET(25 MG) BY MOUTH DAILY 90 tablet 3   nebivolol (BYSTOLIC) 10 MG tablet TAKE 1 TABLET(10 MG) BY MOUTH DAILY 90 tablet 2   nitroGLYCERIN (NITROSTAT) 0.4 MG SL tablet DISSOLVE  1 TABLET UNDER THE TONGUE EVERY 5 MINUTES AS NEEDED FOR CHEST PAIN 25 tablet 7   Polyethyl Glycol-Propyl Glycol (SYSTANE) 0.4-0.3 % GEL ophthalmic gel Place 1 application into both eyes daily as needed (for dry eyes).      pregabalin (LYRICA) 75 MG capsule TAKE 1 CAPSULE(75 MG) BY MOUTH AT BEDTIME 30 capsule 0   psyllium (METAMUCIL) 58.6 % powder Take 1 packet by mouth daily.     ranolazine (RANEXA) 500 MG 12 hr tablet Take 1 tablet (500 mg total) by mouth 2 (two) times daily. 180 tablet 3   REPATHA SURECLICK 295 MG/ML SOAJ INJECT 1 PEN UNDER THE SKIN EVERY 14 DAYS 6 mL 3   SYMBICORT 80-4.5 MCG/ACT inhaler INHALE 2 PUFFS INTO THE LUNGS TWICE DAILY 10.2 g 5   torsemide (DEMADEX) 20 MG tablet Take one tablet by mouth every other day 45 tablet 3   VENTOLIN HFA 108 (90 Base) MCG/ACT inhaler INHALE 2 PUFFS INTO THE LUNGS EVERY 6 HOURS AS NEEDED FOR WHEEZING OR SHORTNESS OF BREATH 18 g 11   No current facility-administered medications on file prior to visit.    Past Medical History:  Diagnosis Date   A-fib [facility])    Abnormal CT of the chest 2008   last CT4-l 2009:  . No f/u suggested    Allergy    Asthma    CAD (coronary artery disease)    a. Coronary CTA 10/16: Coronary Ca score 211, mod non-obstructive CAD with LM mild plaque (25-50%), mid LAD 50-69%. b. Neg nuc 06/2015.   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[place]) 2016   Minimally invasive right hemicolectomy to be done    Neuromuscular disorder ([place])    FIBROMYALGIA   Ocular migraine    OSA (obstructive sleep apnea) 09/2007   dx w/ a sleep study, not on  CPAP   Osteoarthritis    Osteoporosis     Pneumonia    "double" in 2004   PONV (postoperative nausea and vomiting)    Reactive airway disease 01/29/2002   dx of pseudoasthma / vcd in 2005 and nl sprirometry History of dyspnea, 2011,  improved after several medications were changed around Question of COPD, disproved July 06, 2009 with nl pft's       Rheumatoid factor positive    Shingles 11/2009   Sleep apnea    Stroke ([place])    TIA (transient ischemic attack)    x2 - on Plavix for this   Torn rotator cuff    right worse than left, both are torn   Tumor, thyroid    partial thyroidectomy  in the 60s   Type II diabetes mellitus ([place])    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 Pernell Dupre (done after colon surgery)   CATARACT EXTRACTION W/ INTRAOCULAR LENS  IMPLANT, BILATERAL  2012   COLON SURGERY  10/2014   EYE SURGERY Bilateral    torq lens for cataracts   LEFT HEART CATH AND CORONARY ANGIOGRAPHY N/A 03/22/2016   Procedure: Left Heart Cath and Coronary Angiography;  Surgeon: Belva Crome, MD;  Location: [facility];  Service: Cardiovascular;  Laterality: N/A;   LUMBAR LAMINECTOMY/DECOMPRESSION MICRODISCECTOMY N/A 01/20/2018   Procedure: L4-5 decompression;  Surgeon: Marybelle Killings, MD;  Location: [facility];  Service: Orthopedics;  Laterality: N/A;   THYROIDECTOMY, PARTIAL  1960's   TOTAL KNEE ARTHROPLASTY Right 12/04/2019   Procedure: RIGHT TOTAL KNEE ARTHROPLASTY;  Surgeon: Marybelle Killings, MD;  Location: [facility];  Service: Orthopedics;  Laterality: Right;  RNFA APRIL [place]   "Laser surgery for vaginal cancer; followed by chemotherapy" (06/27/2012)    Social History   Socioeconomic History    Marital status: Married    Spouse name: Ilona Sorrel   Number of children: 2   Years of education: masters   Highest education level: Not on file  Occupational History   Occupation: Retired, disable since 2000    Employer: RETIRED  Tobacco Use   Smoking status: Former    Packs/day: 0.25    Years: 5.00    Pack years: 1.25    Types: Cigarettes    Quit date: 01/29/1998    Years since quitting: 23.0   Smokeless tobacco: Never   Tobacco comments:    Quit in 2001  Vaping Use   Vaping Use: Never used  Substance and Sexual Activity   Alcohol use: No    Alcohol/week: 0.0 standard drinks   Drug use: No   Sexual activity: Never  Other Topics Concern   Not on file  Social History Narrative   On disability since 2000--- also husband has MS   Education. College.   Right handed.   Social Determinants of Health   Financial Resource Strain: Low Risk    Difficulty of Paying Living Expenses: Not hard at all  Food Insecurity: No Food Insecurity   Worried About Charity fundraiser in the Last Year: Never true   [place] in the Last Year: Never true  Transportation Needs: No Transportation Needs   Lack of Transportation (Medical): No   Lack of Transportation (Non-Medical): No  Physical Activity: Insufficiently Active   Days of Exercise per Week: 4 days   Minutes of Exercise per Session: 30 min  Stress: No Stress Concern Present   Feeling of Stress : Not at all  Social Connections: Moderately Isolated   Frequency of Communication with Friends and Family: Twice a week   Frequency of Social Gatherings with Friends and Family: Twice a week   Attends Religious Services: More than 4 times per year   Active Member of [employer] or Organizations: No   Attends Archivist Meetings: Never  Marital Status: Widowed    Family History  Problem Relation Age of Onset   Heart disease Father    Heart disease Mother    Lung cancer Mother    Allergies Sister    Parkinsonism Sister         possible   Asthma Sister    Asthma Paternal Grandmother    Stroke Paternal Grandmother    Heart disease Other        paternal grandparents, maternal grandparents,    Heart disease Brother    Emphysema Brother    Aneurysm Brother        x3   Kidney failure Brother    Diabetes Brother    Diabetes Brother    Stroke Brother    Stroke Maternal Grandmother    Breast cancer Neg Hx    Colon cancer Neg Hx    Heart attack Neg Hx    Esophageal cancer Neg Hx    Rectal cancer Neg Hx    Stomach cancer Neg Hx     Review of Systems  Constitutional:  Negative for fever.  Respiratory:  Positive for cough (minimal), shortness of breath and wheezing.   Cardiovascular:  Positive for palpitations. Negative for chest pain and leg swelling.  Musculoskeletal:  Positive for arthralgias and neck pain.  Neurological:  Positive for dizziness and headaches (neuralgia on left side).      Objective:   Vitals:   02/21/21 1117  BP: 130/86  Pulse: 69  Temp: 98.4 F (36.9 C)  SpO2: 98%   Filed Weights   02/21/21 1117  Weight: 202 lb (91.6 kg)   Body mass index is 36.36 kg/m.  BP Readings from Last 3 Encounters:  02/21/21 130/86  02/14/21 (!) 150/79  01/27/21 134/83    Wt Readings from Last 3 Encounters:  02/21/21 202 lb (91.6 kg)  02/14/21 195 lb (88.5 kg)  01/27/21 195 lb (88.5 kg)    Depression screen [facility] 2/9 01/24/2021 12/16/2020 12/16/2020 08/27/2019 08/21/2018  Decreased Interest 0 0 0 0 0  Down, Depressed, Hopeless 0 0 0 0 1  PHQ - 2 Score 0 0 0 0 1  Altered sleeping - - - - 0  Tired, decreased energy - - - - 0  Change in appetite - - - - 0  Feeling bad or failure about yourself  - - - - 0  Trouble concentrating - - - - 0  Moving slowly or fidgety/restless - - - - 0  Suicidal thoughts - - - - 0  PHQ-9 Score - - - - 1  Difficult doing work/chores - - - - Not difficult at all  Some recent data might be hidden     No flowsheet data found.     Physical  Exam Constitutional: She appears well-developed and well-nourished. No distress.  HENT:  Head: Normocephalic and atraumatic.  Right Ear: External ear normal. Normal ear canal and TM Left Ear: External ear normal.  Normal ear canal and TM Mouth/Throat: Oropharynx is clear and moist.  Eyes: Conjunctivae and EOM are normal.  Neck: Neck supple. No tracheal deviation present. No thyromegaly present.  No carotid bruit  Cardiovascular: Normal rate, regular rhythm and normal heart sounds.   No murmur heard.  No edema. Pulmonary/Chest: Effort normal and breath sounds normal. No respiratory distress. She has no wheezes. She has no rales.  Abdominal: Soft. She exhibits no distension. There is no tenderness.  Lymphadenopathy: She has no cervical adenopathy.  Skin: Skin is warm and  dry. She is not diaphoretic.  Psychiatric: She has a normal mood and affect. Her behavior is normal.     Lab Results  Component Value Date   WBC 3.8 (L) 09/09/2020   HGB 13.0 09/09/2020   HCT 38.6 09/09/2020   PLT 128 (L) 09/09/2020   GLUCOSE 105 (H) 10/20/2020   CHOL 144 10/20/2020   TRIG 76.0 10/20/2020   HDL 85.40 10/20/2020   LDLDIRECT 126.2 12/07/2010   LDLCALC 43 10/20/2020   ALT 12 10/20/2020   AST 17 10/20/2020   NA 140 10/20/2020   K 4.2 10/20/2020   CL 102 10/20/2020   CREATININE 1.09 10/20/2020   BUN 23 10/20/2020   CO2 30 10/20/2020   TSH 2.49 10/20/2020   INR 0.95 07/22/2017   HGBA1C 6.5 10/20/2020   MICROALBUR 0.6 03/03/2010         Assessment & Plan:       See Problem List for Assessment and Plan of chronic medical problems.

## 2021-02-21 ENCOUNTER — Other Ambulatory Visit: Payer: Self-pay

## 2021-02-21 ENCOUNTER — Ambulatory Visit (INDEPENDENT_AMBULATORY_CARE_PROVIDER_SITE_OTHER): Payer: Medicare Other | Admitting: Internal Medicine

## 2021-02-21 VITALS — BP 130/86 | HR 69 | Temp 98.4°F | Ht 62.5 in | Wt 202.0 lb

## 2021-02-21 DIAGNOSIS — G629 Polyneuropathy, unspecified: Secondary | ICD-10-CM | POA: Diagnosis not present

## 2021-02-21 DIAGNOSIS — M5481 Occipital neuralgia: Secondary | ICD-10-CM

## 2021-02-21 DIAGNOSIS — Z794 Long term (current) use of insulin: Secondary | ICD-10-CM | POA: Diagnosis not present

## 2021-02-21 DIAGNOSIS — R350 Frequency of micturition: Secondary | ICD-10-CM | POA: Diagnosis not present

## 2021-02-21 DIAGNOSIS — Z0000 Encounter for general adult medical examination without abnormal findings: Secondary | ICD-10-CM

## 2021-02-21 DIAGNOSIS — E1142 Type 2 diabetes mellitus with diabetic polyneuropathy: Secondary | ICD-10-CM | POA: Diagnosis not present

## 2021-02-21 DIAGNOSIS — E559 Vitamin D deficiency, unspecified: Secondary | ICD-10-CM | POA: Diagnosis not present

## 2021-02-21 DIAGNOSIS — J4530 Mild persistent asthma, uncomplicated: Secondary | ICD-10-CM | POA: Diagnosis not present

## 2021-02-21 DIAGNOSIS — I10 Essential (primary) hypertension: Secondary | ICD-10-CM | POA: Diagnosis not present

## 2021-02-21 DIAGNOSIS — N1831 Chronic kidney disease, stage 3a: Secondary | ICD-10-CM

## 2021-02-21 DIAGNOSIS — M069 Rheumatoid arthritis, unspecified: Secondary | ICD-10-CM | POA: Diagnosis not present

## 2021-02-21 DIAGNOSIS — I700 Atherosclerosis of aorta: Secondary | ICD-10-CM | POA: Diagnosis not present

## 2021-02-21 DIAGNOSIS — E782 Mixed hyperlipidemia: Secondary | ICD-10-CM

## 2021-02-21 LAB — MICROALBUMIN / CREATININE URINE RATIO
Creatinine,U: 134.2 mg/dL
Microalb Creat Ratio: 0.9 mg/g (ref 0.0–30.0)
Microalb, Ur: 1.2 mg/dL (ref 0.0–1.9)

## 2021-02-21 LAB — LIPID PANEL
Cholesterol: 137 mg/dL (ref 0–200)
HDL: 89.3 mg/dL (ref >39.00)
LDL Cholesterol: 32 mg/dL (ref 0–99)
NonHDL: 48.08
Total CHOL/HDL Ratio: 2
Triglycerides: 78 mg/dL (ref 0.0–149.0)
VLDL: 15.6 mg/dL (ref 0.0–40.0)

## 2021-02-21 LAB — COMPREHENSIVE METABOLIC PANEL
ALT: 14 U/L (ref 0–35)
AST: 15 U/L (ref 0–37)
Albumin: 4.4 g/dL (ref 3.5–5.2)
Alkaline Phosphatase: 50 U/L (ref 39–117)
BUN: 22 mg/dL (ref 6–23)
CO2: 28 mEq/L (ref 19–32)
Calcium: 9.5 mg/dL (ref 8.4–10.5)
Chloride: 101 mEq/L (ref 96–112)
Creatinine, Ser: 1.25 mg/dL — ABNORMAL HIGH (ref 0.40–1.20)
GFR: 41.2 mL/min — ABNORMAL LOW (ref >60.00)
Glucose, Bld: 103 mg/dL — ABNORMAL HIGH (ref 70–99)
Potassium: 4 mEq/L (ref 3.5–5.1)
Sodium: 138 mEq/L (ref 135–145)
Total Bilirubin: 0.4 mg/dL (ref 0.2–1.2)
Total Protein: 7.3 g/dL (ref 6.0–8.3)

## 2021-02-21 LAB — URINALYSIS, ROUTINE W REFLEX MICROSCOPIC
Bilirubin Urine: NEGATIVE
Ketones, ur: NEGATIVE
Nitrite: NEGATIVE
Specific Gravity, Urine: 1.025 (ref 1.000–1.030)
Total Protein, Urine: NEGATIVE
Urine Glucose: 100 — AB
Urobilinogen, UA: 0.2 (ref 0.0–1.0)
pH: 5 (ref 5.0–8.0)

## 2021-02-21 LAB — CBC WITH DIFFERENTIAL/PLATELET
Basophils Absolute: 0 10*3/uL (ref 0.0–0.1)
Basophils Relative: 0.4 % (ref 0.0–3.0)
Eosinophils Absolute: 0.1 10*3/uL (ref 0.0–0.7)
Eosinophils Relative: 2.6 % (ref 0.0–5.0)
HCT: 41.4 % (ref 36.0–46.0)
Hemoglobin: 13.5 g/dL (ref 12.0–15.0)
Lymphocytes Relative: 37.3 % (ref 12.0–46.0)
Lymphs Abs: 1.3 10*3/uL (ref 0.7–4.0)
MCHC: 32.6 g/dL (ref 30.0–36.0)
MCV: 84.8 fl (ref 78.0–100.0)
Monocytes Absolute: 0.3 10*3/uL (ref 0.1–1.0)
Monocytes Relative: 8.3 % (ref 3.0–12.0)
Neutro Abs: 1.8 10*3/uL (ref 1.4–7.7)
Neutrophils Relative %: 51.4 % (ref 43.0–77.0)
Platelets: 128 10*3/uL — ABNORMAL LOW (ref 150.0–400.0)
RBC: 4.88 Mil/uL (ref 3.87–5.11)
RDW: 14.8 % (ref 11.5–15.5)
WBC: 3.6 10*3/uL — ABNORMAL LOW (ref 4.0–10.5)

## 2021-02-21 LAB — HEMOGLOBIN A1C: Hgb A1c MFr Bld: 6.6 % — ABNORMAL HIGH (ref 4.6–6.5)

## 2021-02-21 LAB — VITAMIN D 25 HYDROXY (VIT D DEFICIENCY, FRACTURES): VITD: 33.55 ng/mL (ref 30.00–100.00)

## 2021-02-21 MED ORDER — PREGABALIN 75 MG PO CAPS: 75.0000 mg | ORAL_CAPSULE | Freq: Three times a day (TID) | ORAL | 0 refills | Status: DC

## 2021-02-21 NOTE — Assessment & Plan Note (Signed)
Chronic cmp 

## 2021-02-21 NOTE — Assessment & Plan Note (Signed)
Subacute Has been having pain radiating from posterior left head to her eye Known cervical arthritis stenosis Following with orthopedics and will see neurology We will increase Lyrica from 75 mg daily to 75 mg twice daily and then to 3 times daily

## 2021-02-21 NOTE — Assessment & Plan Note (Signed)
Chronic Check lipid panel  Continue repatha Regular exercise and healthy diet encouraged  

## 2021-02-21 NOTE — Assessment & Plan Note (Signed)
Acute Has chronic increased urinary frequency due to torsemide, but wonders if she has a little bit of an infection Check UA, urine culture

## 2021-02-21 NOTE — Assessment & Plan Note (Addendum)
Chronic Not ideally controlled Advised to take Symbicort twice daily every day-not just as needed Continue albuterol as needed Has nebulizer machine at home and uses that as needed

## 2021-02-21 NOTE — Assessment & Plan Note (Signed)
Chronic Taking vitamin D daily Check vitamin D level  

## 2021-02-21 NOTE — Assessment & Plan Note (Addendum)
Chronic Blood pressure borderline here today-diastolic is on the high side.  Has been variable at home likely related to pain-hopefully trying to better control the pain will help with her blood pressure is well-no change in medication today CMP Continue Imdur 90 mg daily, losartan 25 mg daily, Bystolic 10 mg daily

## 2021-02-21 NOTE — Assessment & Plan Note (Signed)
Chronic Lab Results  Component Value Date   HGBA1C 6.5 10/20/2020   Sugars controlled last year-check A1c, urine microalbumin today Continue Farxiga 10 mg daily Continue diabetic diet Stressed regular exercise

## 2021-02-21 NOTE — Assessment & Plan Note (Signed)
Chronic Management per rheumatology On plaquenil

## 2021-02-21 NOTE — Assessment & Plan Note (Signed)
Chronic On Repatha 140 mg every 2 weeks Encouraged healthy diet and regular exercise

## 2021-02-21 NOTE — Assessment & Plan Note (Addendum)
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

## 2021-02-23 LAB — URINE CULTURE

## 2021-02-23 MED ORDER — NITROFURANTOIN MONOHYD MACRO 100 MG PO CAPS: 100.0000 mg | ORAL_CAPSULE | Freq: Two times a day (BID) | ORAL | 0 refills | Status: DC

## 2021-02-23 NOTE — Addendum Note (Signed)
Addended by: [name] on: 02/23/2021 03:20 PM   Modules accepted: Orders

## 2021-02-28 ENCOUNTER — Telehealth: Payer: Self-pay | Admitting: Orthopaedic Surgery

## 2021-02-28 ENCOUNTER — Ambulatory Visit: Payer: Medicare Other | Admitting: *Deleted

## 2021-02-28 DIAGNOSIS — I509 Heart failure, unspecified: Secondary | ICD-10-CM | POA: Diagnosis not present

## 2021-02-28 DIAGNOSIS — E1142 Type 2 diabetes mellitus with diabetic polyneuropathy: Secondary | ICD-10-CM

## 2021-02-28 DIAGNOSIS — I5032 Chronic diastolic (congestive) heart failure: Secondary | ICD-10-CM

## 2021-02-28 DIAGNOSIS — E1159 Type 2 diabetes mellitus with other circulatory complications: Secondary | ICD-10-CM

## 2021-02-28 DIAGNOSIS — I10 Essential (primary) hypertension: Secondary | ICD-10-CM

## 2021-02-28 DIAGNOSIS — Z794 Long term (current) use of insulin: Secondary | ICD-10-CM | POA: Diagnosis not present

## 2021-02-28 NOTE — Telephone Encounter (Signed)
Please advise 

## 2021-02-28 NOTE — Patient Instructions (Signed)
Visit [place], thank you for taking time to talk with me today. Please don't hesitate to contact me if I can be of assistance to you before our next scheduled telephone appointment.  Below are the goals we discussed today:  Patient Self-Care Activities: Patient Samantha [name] will: Take medications as prescribed Attend all scheduled provider appointments Call pharmacy for medication refills Call provider office for new concerns or questions Continue to check fasting (first thing in the morning, before eating) blood sugars at home Continue monitoring your weights and blood pressures at home Continue to follow heart healthy, low salt, low cholesterol, carbohydrate-modified, low sugar diet Keep up the great work preventing falls at home- I have again asked Dr. Quay Burow to address your request for a rollator walker Consider taking an OTC probiotic since you are taking antibiotics and having diarrhea-- diarrhea is a common side effect of antibiotic therapy; probiotics may help decrease the severity of this expected side effect Stay in touch with Dr. Lorin Mercy around your ongoing pain; please remember to tell Dr. Lorin Mercy that the traction device he prescribed for you is not helping and is making your neck pain worse  Our next scheduled telephone follow up visit/ appointment with care management team member is scheduled on:   Wednesday, March 29, 2021 at 9:45 am- This is a PHONE [place] appointment  If you need to cancel or re-schedule our visit, please call [phone] and our care guide team will be happy to assist you.   I look forward to hearing about your progress.   Oneta Rack, RN, BSN, [place] [phone]: direct office  If you are experiencing a Mental Health or [place] or need someone to talk to, please  call the Suicide and Crisis Lifeline: 988 call the [place] National Suicide Prevention Lifeline:  325-821-6203 or TTY: 780-629-6329 TTY (757)199-6399) to talk to a trained counselor call 1-800-273-TALK (toll free, 24 hour hotline) go to [facility] [address], [place] [phone]) call 911   Patient verbalizes understanding of instructions and care plan provided today and agrees to view in [place]. Active MyChart status confirmed with patient  Antibiotic Medicine, Adult Antibiotic medicines treat infections caused by a type of germ called bacteria. These medicines work by killing the bacteria that make you sick. You should take antibiotic medicines safely and only when needed. When do I need to take antibiotics? You may need antibiotics for: A urinary tract infection (UTI). Strep throat. A sinus infection caused by bacteria. Meningitis. This affects the spinal cord and brain. A bad lung infection. You may start your medicines while your doctor waits for your results on some tests. When your results come back, your doctor may change or stop your medicine based on your test results. When are antibiotics not needed? You do not need these medicines for most common illnesses, such as: A cold. The flu. A sore throat. Mucus being an odd color. Bronchitis. Sometimes, antibiotics are not needed for an infection caused by bacteria. Do not ask for these medicines, or take them, when they are not needed. How long should I take my antibiotic? You need to take all your medicine. Take your antibiotic medicine as told by your doctor. Do not stop taking the antibiotic even if you start to feel better. If you stop taking it too soon: You may feel sick again. Your infection may get harder to treat. Antibiotics need different amounts of  time to work. Some treatments last just a few days. Some last about a week to 10 days. Sometimes, you may need to take antibiotics for a few weeks to fully treat your infection. What if I miss a dose? Try not to miss a  dose. If you miss a dose, call your doctor or pharmacist. Sometimes, it is okay to take the missed dose as soon as you can. Do not take an extra dose. What are the risks of taking antibiotics? Antibiotics can cause: Allergic attacks. A feeling like you may vomit (nausea). Yeast infections. Liver problems. These medicines can cause an infection called C. diff. This causes watery poop (diarrhea). This happens when antibiotics kill good germs in your gut. This lets C. diff grow. Tell your doctor right away if: You get watery poop while taking your antibiotic. You get watery poop after you stop your antibiotic. C. diff can happen weeks after you stop your medicine. You also have a risk of getting an infection in the future that antibiotics cannot treat (antibiotic-resistant infection). These infections can get very bad. Sometimes, they can be life-threatening. Do antibiotics affect birth control? Birth control pills may not work. If you take birth control pills: Keep taking them as normal. Use a second form of birth control, such as a condom. Do this for as long as told by your doctor. What else should I know about taking antibiotics? You need to take these medicines exactly as told. Make sure to do these things: Take the right amount of medicine at the same time each day. Ask your doctor: How long to wait between doses. If you should take your medicine with food. If you should stay away from some foods, drinks, or medicines. What side effects you should watch for. Use only the medicines that your doctor said to use. Do not use medicines that were given to someone else. Drink a large glass of water when you take your medicine. Drink enough fluid to keep your pee (urine) pale yellow. Ask your pharmacist for a tool to measure your medicine. This may be a syringe, cup, or spoon. Throw out any extra medicine. Follow these instructions at home: Take over-the-counter and prescription medicines as  told by your doctor. Return to your normal activities as told by your doctor. Ask your doctor what activities are safe for you. Keep all follow-up visits as told by your doctor. This is important. Contact a doctor if: You feel worse. You have one of these after you start your medicine: New joint pain. New muscle aches. You have side effects from your medicine, such as: Stomach pain. Watery poop. Feeling like you may vomit. White patches in your mouth or throat. Get help right away if: You have a very bad allergic attack. If this happens, stop taking your medicine right away. You may get: Hives. These are raised, itchy, red bumps on your skin. Skin rash. Trouble breathing. Breathing that has whistling sounds. Swelling on your body. A dizzy feeling. Vomiting. You have symptoms of liver problems. You may have: Dark pee, or pee that is the color of blood. Yellow skin. Easy bruising. Easy bleeding. You have very bad watery poop. You have cramps in your belly. You have a very bad headache. These symptoms may be an emergency. Do not wait to see if the symptoms will go away. Get medical help right away. Call your local emergency services (911 in the [place] Do not drive yourself to the hospital. Summary Antibiotics are used to  treat infections caused by bacteria. Take these medicines safely and only when needed. Your doctor may change or stop your medicine based on your test results. Take all your medicine even when you feel better. This information is not intended to replace advice given to you by your health care provider. Make sure you discuss any questions you have with your health care provider. Document Revised: 11/04/2018 Document Reviewed: 11/04/2018 Elsevier Patient Education  [place].  Probiotics Probiotics are the good bacteria and yeasts that live in your body and keep your digestive system healthy. Probiotics also help your body's defense system (immune system)  and protect your body against the growth of harmful bacteria. Your health care provider may recommend taking a probiotic if you are taking antibiotics or have certain medical conditions, such as: Diarrhea. Constipation. Irritable bowel syndrome. Lung infections. Yeast infections. Acne, eczema, and other skin conditions. Frequent urinary tract infections. What affects the balance of bacteria in my body? The balance of good bacteria in your body can be affected by: Antibiotic medicines. These medicines treat infections caused by bacteria. Unfortunately, they may kill the good bacteria in your body as well as the bad bacteria. Certain medical conditions. Conditions related to an imbalance of bacteria include: Stomach and intestine (gastrointestinal) infections. Lung infections. Skin infections. Vaginal infections. Inflammatory bowel diseases. Stomach ulcers (gastric ulcers). Tooth decay and gum disease (periodontal disease). Stress. Poor diet. What type of probiotic is right for me? Probiotics contain different types of bacteria (strains). Strains commonly found in probiotics include: Lactobacillus. Saccharomyces. Bifidobacterium. Specific strains have been shown to be more effective for certain health conditions. Ask your health care provider which strain or strains you should use and how often. Probiotics come in many different forms, strain combinations, and strengths. Some may need to be refrigerated. Always read the label for storage and usage instructions. Certain foods, such as yogurt, contain probiotics. Probiotics can also be bought as a supplement at a pharmacy, health food store, or grocery store. Talk to your health care provider before starting any supplement. What are the side effects of probiotics? Some people have side effects when taking probiotics. Side effects are usually temporary and may include: Gas. Bloating. Cramping. Serious side effects are rare. Follow these  instructions at home:  If you are taking probiotics with antibiotics: Wait at least 2 hours between taking your medicine and the probiotic. Eat foods high in fiber, such as whole grains, beans, and vegetables. These foods can help good bacteria grow. Avoid certain foods as told by your health care provider. Summary Probiotics are the good bacteria and yeasts that live in your body and keep you and your digestive system healthy. Certain foods, such as yogurt, contain probiotics. Probiotics can be taken as supplements. They can be bought at a pharmacy, health food store, or grocery store. They come in many different forms, strain combinations, and strengths. Be sure to talk with your health care provider before taking a probiotic supplement. This information is not intended to replace advice given to you by your health care provider. Make sure you discuss any questions you have with your health care provider. Document Revised: 10/04/2017 Document Reviewed: 01/30/2017 Elsevier Patient Education  2022 [place].  Urinary Tract Infection, Adult A urinary tract infection (UTI) is an infection of any part of the urinary tract. The urinary tract includes the kidneys, ureters, bladder, and urethra. These organs make, store, and get rid of urine in the body. An upper UTI affects the ureters  and kidneys. A lower UTI affects the bladder and urethra. What are the causes? Most urinary tract infections are caused by bacteria in your genital area around your urethra, where urine leaves your body. These bacteria grow and cause inflammation of your urinary tract. What increases the risk? You are more likely to develop this condition if: You have a urinary catheter that stays in place. You are not able to control when you urinate or have a bowel movement (incontinence). You are female and you: Use a spermicide or diaphragm for birth control. Have low estrogen levels. Are pregnant. You have certain genes  that increase your risk. You are sexually active. You take antibiotic medicines. You have a condition that causes your flow of urine to slow down, such as: An enlarged prostate, if you are female. Blockage in your urethra. A kidney stone. A nerve condition that affects your bladder control (neurogenic bladder). Not getting enough to drink, or not urinating often. You have certain medical conditions, such as: Diabetes. A weak disease-fighting system (immunesystem). Sickle cell disease. Gout. Spinal cord injury. What are the signs or symptoms? Symptoms of this condition include: Needing to urinate right away (urgency). Frequent urination. This may include small amounts of urine each time you urinate. Pain or burning with urination. Blood in the urine. Urine that smells bad or unusual. Trouble urinating. Cloudy urine. Vaginal discharge, if you are female. Pain in the abdomen or the lower back. You may also have: Vomiting or a decreased appetite. Confusion. Irritability or tiredness. A fever or chills. Diarrhea. The first symptom in older adults may be confusion. In some cases, they may not have any symptoms until the infection has worsened. How is this diagnosed? This condition is diagnosed based on your medical history and a physical exam. You may also have other tests, including: Urine tests. Blood tests. Tests for STIs (sexually transmitted infections). If you have had more than one UTI, a cystoscopy or imaging studies may be done to determine the cause of the infections. How is this treated? Treatment for this condition includes: Antibiotic medicine. Over-the-counter medicines to treat discomfort. Drinking enough water to stay hydrated. If you have frequent infections or have other conditions such as a kidney stone, you may need to see a health care provider who specializes in the urinary tract (urologist). In rare cases, urinary tract infections can cause sepsis. Sepsis  is a life-threatening condition that occurs when the body responds to an infection. Sepsis is treated in the hospital with IV antibiotics, fluids, and other medicines. Follow these instructions at home: Medicines Take over-the-counter and prescription medicines only as told by your health care provider. If you were prescribed an antibiotic medicine, take it as told by your health care provider. Do not stop using the antibiotic even if you start to feel better. General instructions Make sure you: Empty your bladder often and completely. Do not hold urine for long periods of time. Empty your bladder after sex. Wipe from front to back after urinating or having a bowel movement if you are female. Use each tissue only one time when you wipe. Drink enough fluid to keep your urine pale yellow. Keep all follow-up visits. This is important. Contact a health care provider if: Your symptoms do not get better after 1-2 days. Your symptoms go away and then return. Get help right away if: You have severe pain in your back or your lower abdomen. You have a fever or chills. You have nausea or vomiting. Summary A  urinary tract infection (UTI) is an infection of any part of the urinary tract, which includes the kidneys, ureters, bladder, and urethra. Most urinary tract infections are caused by bacteria in your genital area. Treatment for this condition often includes antibiotic medicines. If you were prescribed an antibiotic medicine, take it as told by your health care provider. Do not stop using the antibiotic even if you start to feel better. Keep all follow-up visits. This is important. This information is not intended to replace advice given to you by your health care provider. Make sure you discuss any questions you have with your health care provider. Document Revised: 08/28/2019 Document Reviewed: 08/28/2019 Elsevier Patient Education  [place].

## 2021-02-28 NOTE — Chronic Care Management (AMB) (Signed)
Chronic Care Management   CCM RN Visit Note  02/28/2021 Name: Samantha [name] MRN: [id number] DOB: Nov 22, 1940  Subjective: Samantha [name] is a 81 y.o. year old female who is a primary care patient of Burns, Claudina Lick, MD. The care management team was consulted for assistance with disease management and care coordination needs.    Engaged with patient by telephone for follow up visit in response to provider referral for case management and/or care coordination services.   Consent to Services:  The patient was given information about Chronic Care Management services, agreed to services, and gave verbal consent prior to initiation of services.  Please see initial visit note for detailed documentation.   Patient agreed to services and verbal consent obtained.   Assessment: Review of patient past medical history, allergies, medications, health status, including review of consultants reports, laboratory and other test data, was performed as part of comprehensive evaluation and provision of chronic care management services.  CCM Care Plan  Allergies  Allergen Reactions   Bactrim [Sulfamethoxazole-Trimethoprim] Other (See Comments)    See OV 09-15-13, rash-tongue swelling due to bactrim ?   Cefuroxime Axetil Hives   Oxycodone Nausea And Vomiting   Pravastatin Other (See Comments)    "muscle breakdown" with profuse sweating   Seldane [Terfenadine] Hives   Zocor [Simvastatin] Other (See Comments)    2012 "Muscle breakdown " with profuse sweating   Tramadol Nausea And Vomiting   Atorvastatin Other (See Comments)    Pt reports muscle aches and joint pains   Cefuroxime Other (See Comments)   Lactose Intolerance (Gi) Diarrhea   Latex     blisters   Lime Flavor [Flavoring Agent] Diarrhea   Metformin And Related Diarrhea   Oxycodone Hcl Other (See Comments)   Pravastatin Sodium Other (See Comments)   Rosuvastatin Other (See Comments)    Pt reports "myalgias, fatigue, nosebleeds."    Rosuvastatin Calcium Other (See Comments)   Tape Other (See Comments)    blisters   Outpatient Encounter Medications as of 02/28/2021  Medication Sig   acetaminophen (TYLENOL) 500 MG tablet Take 500 mg by mouth every 6 (six) hours as needed for mild pain.    albuterol (PROVENTIL) (2.5 MG/3ML) 0.083% nebulizer solution Take 3 mLs (2.5 mg total) by nebulization every 6 (six) hours as needed for wheezing or shortness of breath.   Calcium-Magnesium-Zinc (CAL-MAG-ZINC PO) Take 1 tablet by mouth daily.   cetirizine (ZYRTEC) 10 MG tablet TAKE 1 TABLET(10 MG) BY MOUTH DAILY   cholecalciferol (VITAMIN D) 1000 UNITS tablet Take 1,000 Units by mouth every morning.    clopidogrel (PLAVIX) 75 MG tablet TAKE 1 TABLET(75 MG) BY MOUTH DAILY   dapagliflozin propanediol (FARXIGA) 10 MG TABS tablet Take 1 tablet (10 mg total) by mouth daily before breakfast.   famotidine (PEPCID) 40 MG tablet TAKE 1 TABLET(40 MG) BY MOUTH DAILY AS NEEDED FOR HEARTBURN OR INDIGESTION   glucose blood test strip Use as instructed   hydroxychloroquine (PLAQUENIL) 200 MG tablet Take 400 mg by mouth daily.    isosorbide mononitrate (IMDUR) 30 MG 24 hr tablet Take 3 tablets (90 mg total) by mouth daily.   Lancets (ONETOUCH ULTRASOFT) lancets Use as instructed   losartan (COZAAR) 25 MG tablet TAKE 1 TABLET(25 MG) BY MOUTH DAILY   nebivolol (BYSTOLIC) 10 MG tablet TAKE 1 TABLET(10 MG) BY MOUTH DAILY   nitrofurantoin, macrocrystal-monohydrate, (MACROBID) 100 MG capsule Take 1 capsule (100 mg total) by mouth 2 (two) times daily.   nitroGLYCERIN (  NITROSTAT) 0.4 MG SL tablet DISSOLVE 1 TABLET UNDER THE TONGUE EVERY 5 MINUTES AS NEEDED FOR CHEST PAIN   Polyethyl Glycol-Propyl Glycol (SYSTANE) 0.4-0.3 % GEL ophthalmic gel Place 1 application into both eyes daily as needed (for dry eyes).    pregabalin (LYRICA) 75 MG capsule Take 1 capsule (75 mg total) by mouth 3 (three) times daily.   psyllium (METAMUCIL) 58.6 % powder Take 1 packet by mouth  daily.   ranolazine (RANEXA) 500 MG 12 hr tablet Take 1 tablet (500 mg total) by mouth 2 (two) times daily.   REPATHA SURECLICK 144 MG/ML SOAJ INJECT 1 PEN UNDER THE SKIN EVERY 14 DAYS   SYMBICORT 80-4.5 MCG/ACT inhaler INHALE 2 PUFFS INTO THE LUNGS TWICE DAILY   torsemide (DEMADEX) 20 MG tablet Take one tablet by mouth every other day   VENTOLIN HFA 108 (90 Base) MCG/ACT inhaler INHALE 2 PUFFS INTO THE LUNGS EVERY 6 HOURS AS NEEDED FOR WHEEZING OR SHORTNESS OF BREATH   No facility-administered encounter medications on file as of 02/28/2021.   Patient Active Problem List   Diagnosis Date Noted   Urinary frequency 02/21/2021   Occipital neuralgia of left side 02/21/2021   CKD (chronic kidney disease) stage 3, GFR 30-59 ml/min (HCC) 02/20/2021   Spinal stenosis of cervical region 02/16/2021   Rheumatoid arthritis ([place]) - Dr Dossie Der 10/20/2020   Neuropathy 10/19/2020   Vitamin D deficiency 04/13/2020   S/P TKR (total knee replacement), 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   Aortic atherosclerosis ([place]) 11/03/2019   Posterior vitreous detachment of right eye 07/15/2019   Posterior vitreous detachment of left eye 07/15/2019   Diabetes mellitus without complication ([place]) 81/85/6314   Recurrent corneal erosion, 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   Raynaud phenomenon 05/06/2019   Trigger finger, left ring finger 03/18/2019   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 01/28/2018   Sacral back pain 10/16/2017   Chronic left upper quadrant pain 07/23/2017   Poor balance 03/14/2017   Angina pectoris ([place]) 03/22/2016   Hypertensive heart disease 07/07/2015   Essential hypertension 07/07/2015   Lightheadedness 07/07/2015   Plantar fasciitis, left 03/22/2015   Coronary artery disease due to lipid rich plaque 01/05/2015   Chronic diastolic CHF (congestive heart failure), NYHA class 2 ([place]) 01/05/2015   Malignant neoplasm of ascending colon  pT1, pN0, rM0 s/p robotic colectomy 11/11/2014 11/11/2014    Migraine (Ocular) 01/05/2014   VBI (vertebrobasilar insufficiency) 08/22/2012   TIA (transient ischemic attack) 06/27/2012   DJD (degenerative joint disease) 02/02/2011   Varicose veins of legs 06/06/2010   Palpitations 11/23/2008   UTI'S, RECURRENT 09/28/2008   Fibromyalgia 08/15/2007   DM II (diabetes mellitus, type II), w/ neuropathy 05/21/2006   Hyperlipidemia 05/21/2006   Asthma 01/29/2002   Conditions to be addressed/monitored:  CHF, HTN, and DMII  Care Plan : RN Care Manager Plan of Care  Updates made by Samantha Royalty, RN since 02/28/2021 12:00 AM     Problem: Chronic Disease Management Needs   Priority: High     Long-Range Goal: Ongoing adherence to established plan of care for long term chronic disease management   Start Date: 01/24/2021  Expected End Date: 01/24/2022  Priority: High  Note:   Current Barriers:  Chronic Disease Management support and education needs related to CHF and DMII Chronic pain- well managed with medications Caregiver for daughter, has cancer currently under control; patient's brother currently has cancer and patient is periodically his caregiver- brother lives in [place], [place]: 01/24/21- patient declines need  for caregiver resources/ support  RNCM Clinical Goal(s):  Patient will demonstrate ongoing health management independence as evidenced by adherence to plan of care for CHF/ DMII        through collaboration with RN Care manager, provider, and care team.   Interventions: 1:1 collaboration with primary care provider regarding development and update of comprehensive plan of care as evidenced by provider attestation and co-signature Inter-disciplinary care team collaboration (see longitudinal plan of care) Evaluation of current treatment plan related to  self management and patient's adherence to plan as established by provider Initial assessment completed 01/24/21 02/28/21: Discussed ongoing management of chronic pain: patient adherent to  prescribed medications; reports independently manages pain, prescribed medications "help;" acknowledges that she is taking increased dose of pain medication as prescribed at time of PCP office visit 02/21/21 Reviewed recent orthopedic provider office visit 02/14/21- reports traction device she was provided is "not helping her pain at all, and in fact makes it worse:" she has stopped trying to use the traction device due to the increased pain it has caused: encouraged patient to promptly update/ make Dr. Lorin Mercy aware Reviewed recent PCP office visit 02/21/21 with patient: confirms she is taking prescribed antibiotics for UTI; states she forgot to ask/ follow up on her previous request for rollator walker-- I will again reach out to PCP to re-request and encouraged her to listen out for call from office staff/ DME company in the coming days   Confirmed no medication concerns: patient continues independently self-managing-- she is taking prescribed antibiotic for UTI per PCP office visit instructions 02/21/21: reports is having diarrhea; provided education around this common side effect of antibiotic therapy-- discussed benefit of adding OTC probiotic if patient wishes to- she will consider Reviewed self-care with UTI: need to remain hydrated, will provide additional printed education through [facility] Confirmed no new/ recent falls, continues using cane; as above, again asks for rollator walker; previously provided education around fall risk/ prevention reinforced Reviewed upcoming scheduled provider appointments and confirmed patient has plans to attend as scheduled; her sister/ daughter are currently assisting with transportation due to her ongoing neck pain:  03/21/21- orthopedic follow up visit 03/28/21- Rheumatology provider office visit 04/17/21- neurology provider office visit 04/20/21- cardiology provider office visit  Heart Failure Interventions:  (Status: 02/28/21 Goal on Track (progressing): YES.)  Long  Term Goal  Basic overview and discussion of pathophysiology of Heart Failure reviewed Discussed importance of daily weight and advised patient to weigh and record daily Reviewed role of diuretics in prevention of fluid overload and management of heart failure Discussed the importance of keeping all appointments with provider Confirmed patient continues to monitor/ records daily weights at home: she reports weighs "3-4 times per week" and reports consistent weights between 193-197 lbs, with a recent weight of "197 lbs;" confirmed no shortness of breath, swelling over baseline Reinforced previously provided education around action plan for weight gain; she verbalizes a good general understanding of standard guideline to report weight gain > 3 lbs overnight/ 5 lbs in one week Discussed ongoing use of diuretics: patient has history of dehydration: takes diuretics every-other- day as prescribed; verbalizes very good baseline understanding of purpose of diuretics, as well as signs/ symptoms dehydration-- reviewed previously provided education, given patient's recent positive result fir UTI Confirmed patient monitors/ writes down on paper blood pressures at home: she reports consistent values at home between 140-170/ 80's: reports blood pressure this morning of "121/75;" reviewed with patient basic goals around blood pressures- she has noticed  both a few isolated high and low readings: we discussed signs/ symptoms HTN crisis, along with signs/ symptoms to promptly report to provider, should they occur Reviewed signs/ symptoms MI/ CVA along with corresponding action plan Confirmed patient currently not active due to ongoing pain  Diabetes:  (Status: 02/28/21: Goal on Track (progressing): YES.) Long Term Goal   Lab Results  Component Value Date   HGBA1C 6.6 (H) 02/21/2021  Counseled on importance of regular laboratory monitoring as prescribed;        Discussed plans with patient for ongoing care management  follow up and provided patient with direct contact information for care management team;      Reviewed scheduled/upcoming provider appointments including: 02/21/21- PCP; 04/20/21- cardiology;         Review of patient status, including review of consultants reports, relevant laboratory and other test results, and medications completed;       Reviewed most recent A1-C value from 02/21/21 PCP office visit Assessed patient's understanding of meaning/ significance of A1-C values: good baseline understanding of same- Reviewed individual historical A1-C trends and provided education around correlation of A1-C value to blood sugar levels at home over 3 months Reviewed recent blood sugars at home: reports consistent fasting values between 100-120; reports blood sugar this morning of "102;" confirmed today no recent low values Reinforced previously provided education of signs/ symptoms hypoglycemia, along with corresponding action plan- patient will benefit from ongoing support/ reinforcement of same  Patient Goals/Self-Care Activities: As evidenced by review of EHR, collaboration with care team, and patient reporting during CCM RN CM outreach,   Patient Harmonii will: Take medications as prescribed Attend all scheduled provider appointments Call pharmacy for medication refills Call provider office for new concerns or questions Continue to check fasting (first thing in the morning, before eating) blood sugars at home Continue monitoring your weights and blood pressures at home Continue to follow heart healthy, low salt, low cholesterol, carbohydrate-modified, low sugar diet Keep up the great work preventing falls at home- I have again asked Dr. Quay Burow to address your request for a rollator walker Consider taking an OTC probiotic since you are taking antibiotics and having diarrhea-- diarrhea is a common side effect of antibiotic therapy; probiotics may help decrease the severity of this expected side  effect Stay in touch with Dr. Lorin Mercy around your ongoing pain; please remember to tell Dr. Lorin Mercy that the traction device he prescribed for you is not helping and is making your neck pain worse    Plan: Telephone follow up appointment with care management team member scheduled for:  Wednesday March 29, 2021 at 9:45 am The patient has been provided with contact information for the care management team and has been advised to call with any health related questions or concerns  Oneta Rack, RN, BSN, [place] [phone]: direct office

## 2021-02-28 NOTE — Telephone Encounter (Signed)
Patient called asked if Gwinda Passe would talk with Dr. Lorin Mercy concerning the traction device that he prescribed for her. Patient said she is having headaches that has gotten worse. Patient said the base of her neck hurts. The number to contact patient is [phone]

## 2021-03-05 ENCOUNTER — Other Ambulatory Visit: Payer: Self-pay | Admitting: Internal Medicine

## 2021-03-05 DIAGNOSIS — M159 Polyosteoarthritis, unspecified: Secondary | ICD-10-CM

## 2021-03-05 DIAGNOSIS — M069 Rheumatoid arthritis, unspecified: Secondary | ICD-10-CM

## 2021-03-05 DIAGNOSIS — M4802 Spinal stenosis, cervical region: Secondary | ICD-10-CM

## 2021-03-05 DIAGNOSIS — G629 Polyneuropathy, unspecified: Secondary | ICD-10-CM

## 2021-03-07 DIAGNOSIS — H5712 Ocular pain, left eye: Secondary | ICD-10-CM | POA: Diagnosis not present

## 2021-03-07 DIAGNOSIS — M069 Rheumatoid arthritis, unspecified: Secondary | ICD-10-CM | POA: Diagnosis not present

## 2021-03-07 DIAGNOSIS — Z79899 Other long term (current) drug therapy: Secondary | ICD-10-CM | POA: Diagnosis not present

## 2021-03-07 DIAGNOSIS — Z961 Presence of intraocular lens: Secondary | ICD-10-CM | POA: Diagnosis not present

## 2021-03-07 DIAGNOSIS — E119 Type 2 diabetes mellitus without complications: Secondary | ICD-10-CM | POA: Diagnosis not present

## 2021-03-20 ENCOUNTER — Other Ambulatory Visit: Payer: Self-pay | Admitting: Pharmacist

## 2021-03-20 DIAGNOSIS — E785 Hyperlipidemia, unspecified: Secondary | ICD-10-CM

## 2021-03-20 DIAGNOSIS — I2510 Atherosclerotic heart disease of native coronary artery without angina pectoris: Secondary | ICD-10-CM

## 2021-03-20 MED ORDER — REPATHA SURECLICK 140 MG/ML ~~LOC~~ SOAJ: SUBCUTANEOUS | 3 refills | Status: DC

## 2021-03-21 ENCOUNTER — Ambulatory Visit: Payer: Medicare Other | Admitting: Orthopaedic Surgery

## 2021-03-28 DIAGNOSIS — M1990 Unspecified osteoarthritis, unspecified site: Secondary | ICD-10-CM | POA: Diagnosis not present

## 2021-03-28 DIAGNOSIS — Z1159 Encounter for screening for other viral diseases: Secondary | ICD-10-CM | POA: Diagnosis not present

## 2021-03-28 DIAGNOSIS — M79643 Pain in unspecified hand: Secondary | ICD-10-CM | POA: Diagnosis not present

## 2021-03-28 DIAGNOSIS — M069 Rheumatoid arthritis, unspecified: Secondary | ICD-10-CM | POA: Diagnosis not present

## 2021-03-28 DIAGNOSIS — M359 Systemic involvement of connective tissue, unspecified: Secondary | ICD-10-CM | POA: Diagnosis not present

## 2021-03-28 DIAGNOSIS — Z8739 Personal history of other diseases of the musculoskeletal system and connective tissue: Secondary | ICD-10-CM | POA: Diagnosis not present

## 2021-03-28 DIAGNOSIS — M797 Fibromyalgia: Secondary | ICD-10-CM | POA: Diagnosis not present

## 2021-03-28 DIAGNOSIS — E669 Obesity, unspecified: Secondary | ICD-10-CM | POA: Diagnosis not present

## 2021-03-28 DIAGNOSIS — I10 Essential (primary) hypertension: Secondary | ICD-10-CM | POA: Diagnosis not present

## 2021-03-28 DIAGNOSIS — Z79899 Other long term (current) drug therapy: Secondary | ICD-10-CM | POA: Diagnosis not present

## 2021-03-28 DIAGNOSIS — I7300 Raynaud's syndrome without gangrene: Secondary | ICD-10-CM | POA: Diagnosis not present

## 2021-03-28 DIAGNOSIS — E785 Hyperlipidemia, unspecified: Secondary | ICD-10-CM | POA: Diagnosis not present

## 2021-03-29 ENCOUNTER — Ambulatory Visit (INDEPENDENT_AMBULATORY_CARE_PROVIDER_SITE_OTHER): Payer: Medicare Other | Admitting: *Deleted

## 2021-03-29 DIAGNOSIS — E1142 Type 2 diabetes mellitus with diabetic polyneuropathy: Secondary | ICD-10-CM

## 2021-03-29 DIAGNOSIS — I5032 Chronic diastolic (congestive) heart failure: Secondary | ICD-10-CM

## 2021-03-29 NOTE — Patient Instructions (Signed)
Visit Information ? ?Samantha [name], thank you for taking time to talk with me today. Please don't hesitate to contact me if I can be of assistance to you before our next scheduled telephone appointment ? ?Below are the goals we discussed today:  ?Patient Self-Care Activities: ?Patient Samantha [name] will: ?Take medications as prescribed ?Attend all scheduled provider appointments ?Call pharmacy for medication refills ?Call provider office for new concerns or questions ?Continue to check fasting (first thing in the morning, before eating) blood sugars at home: the values we reviewed today are all in good range ?Continue monitoring your daily weights and blood pressures at home: the values we reviewed today are all in good range ?Continue to follow heart healthy, low salt, low cholesterol, carbohydrate-modified, low sugar diet ?Keep up the great work preventing falls at home- I am so glad you got and are using your new rollator walker ?If your hemorrhoids continue to bother you, please make an appointment with the GI doctor, as you are planning to do  ? ?Our next scheduled telephone follow up visit/ appointment is scheduled on:  Thursday, May 04, 2021 at 9:45 am- This is a PHONE CALL appointment ? ?If you need to cancel or re-schedule our visit, please call [phone] and our care guide team will be happy to assist you. ?  ?I look forward to hearing about your progress. ?  ?Oneta Rack, RN, BSN, CCRN Alumnus ?[place] ?[phone]: direct office ? ?If you are experiencing a Mental Health or [place] or need someone to talk to, please  ?call the Suicide and Crisis Lifeline: 988 ?call the [place] National Suicide Prevention Lifeline: 479 193 5785 or TTY: 678-426-3007 TTY 2200462134) to talk to a trained counselor ?call 1-800-273-TALK (toll free, 24 hour hotline) ?go to [facility] [address], [place]  [phone]) ?call 911  ? ?Patient verbalizes understanding of instructions and care plan provided today and agrees to view in [place]. Active MyChart status confirmed with patient ? ?Hemorrhoids ?Hemorrhoids are swollen veins that may develop: ?In the butt (rectum). These are called internal hemorrhoids. ?Around the opening of the butt (anus). These are called external hemorrhoids. ?Hemorrhoids can cause pain, itching, or bleeding. Most of the time, they do not cause serious problems. They usually get better with diet changes, lifestyle changes, and other home treatments. ?What are the causes? ?This condition may be caused by: ?Having trouble pooping (constipation). ?Pushing hard (straining) to poop. ?Watery poop (diarrhea). ?Pregnancy. ?Being very overweight (obese). ?Sitting for long periods of time. ?Heavy lifting or other activity that causes you to strain. ?Anal sex. ?Riding a bike for a long period of time. ?What are the signs or symptoms? ?Symptoms of this condition include: ?Pain. ?Itching or soreness in the butt. ?Bleeding from the butt. ?Leaking poop. ?Swelling in the area. ?One or more lumps around the opening of your butt. ?How is this diagnosed? ?A doctor can often diagnose this condition by looking at the affected area. The doctor may also: ?Do an exam that involves feeling the area with a gloved hand (digital rectal exam). ?Examine the area inside your butt using a small tube (anoscope). ?Order blood tests. This may be done if you have lost a lot of blood. ?Have you get a test that involves looking inside the colon using a flexible tube with a camera on the end (sigmoidoscopy or colonoscopy). ?How is this treated? ?This condition can usually be treated at home. Your doctor may  tell you to change what you eat, make lifestyle changes, or try home treatments. If these do not help, procedures can be done to remove the hemorrhoids or make them smaller. These may involve: ?Placing rubber bands at the  base of the hemorrhoids to cut off their blood supply. ?Injecting medicine into the hemorrhoids to shrink them. ?Shining a type of light energy onto the hemorrhoids to cause them to fall off. ?Doing surgery to remove the hemorrhoids or cut off their blood supply. ?Follow these instructions at home: ?Eating and drinking ? ?Eat foods that have a lot of fiber in them. These include whole grains, beans, nuts, fruits, and vegetables. ?Ask your doctor about taking products that have added fiber (fibersupplements). ?Reduce the amount of fat in your diet. You can do this by: ?Eating low-fat dairy products. ?Eating less red meat. ?Avoiding processed foods. ?Drink enough fluid to keep your pee (urine) pale yellow. ?Managing pain and swelling ? ?Take a warm-water bath (sitz bath) for 20 minutes to ease pain. Do this 3-4 times a day. You may do this in a bathtub or using a portable sitz bath that fits over the toilet. ?If told, put ice on the painful area. It may be helpful to use ice between your warm baths. ?Put ice in a plastic bag. ?Place a towel between your skin and the bag. ?Leave the ice on for 20 minutes, 2-3 times a day. ?General instructions ?Take over-the-counter and prescription medicines only as told by your doctor. ?Medicated creams and medicines may be used as told. ?Exercise often. Ask your doctor how much and what kind of exercise is best for you. ?Go to the bathroom when you have the urge to poop. Do not wait. ?Avoid pushing too hard when you poop. ?Keep your butt dry and clean. Use wet toilet paper or moist towelettes after pooping. ?Do not sit on the toilet for a long time. ?Keep all follow-up visits as told by your doctor. This is important. ?Contact a doctor if you: ?Have pain and swelling that do not get better with treatment or medicine. ?Have trouble pooping. ?Cannot poop. ?Have pain or swelling outside the area of the hemorrhoids. ?Get help right away if you have: ?Bleeding that will not  stop. ?Summary ?Hemorrhoids are swollen veins in the butt or around the opening of the butt. ?They can cause pain, itching, or bleeding. ?Eat foods that have a lot of fiber in them. These include whole grains, beans, nuts, fruits, and vegetables. ?Take a warm-water bath (sitz bath) for 20 minutes to ease pain. Do this 3-4 times a day. ?This information is not intended to replace advice given to you by your health care provider. Make sure you discuss any questions you have with your health care provider. ?Document Revised: 07/27/2020 Document Reviewed: 07/27/2020 ?Elsevier Patient Education ? 2022 [place]. ?  ? ? ?

## 2021-03-29 NOTE — Chronic Care Management (AMB) (Signed)
Chronic Care Management   CCM RN Visit Note  03/29/2021 Name: Samantha [name] MRN: [id number] DOB: January 24, 1941  Subjective: Samantha [name] is a 81 y.o. year old female who is a primary care patient of Burns, Claudina Lick, MD. The care management team was consulted for assistance with disease management and care coordination needs.    Engaged with patient by telephone for follow up visit in response to provider referral for case management and/or care coordination services.   Consent to Services:  The patient was given information about Chronic Care Management services, agreed to services, and gave verbal consent prior to initiation of services.  Please see initial visit note for detailed documentation.  Patient agreed to services and verbal consent obtained.   Assessment: Review of patient past medical history, allergies, medications, health status, including review of consultants reports, laboratory and other test data, was performed as part of comprehensive evaluation and provision of chronic care management services.   SDOH (Social Determinants of Health) assessments and interventions performed:  SDOH Interventions    Flowsheet Row Most Recent Value  SDOH Interventions   Food Insecurity Interventions Intervention Not Indicated  [Continues to get MOW,  denies food insecurity]  Housing Interventions Intervention Not Indicated  [Single Family home x 30 years,  lives with daughter,  reports just got new exterior ramp upgrade/ replacement]  Transportation Interventions Intervention Not Indicated  [Patient not driving now due to pain/ neuralgia,  family assisting with transportation]      CCM Care Plan  Allergies  Allergen Reactions   Bactrim [Sulfamethoxazole-Trimethoprim] Other (See Comments)    See OV 09-15-13, rash-tongue swelling due to bactrim ?   Cefuroxime Axetil Hives   Oxycodone Nausea And Vomiting   Pravastatin Other (See Comments)    "muscle breakdown" with profuse  sweating   Seldane [Terfenadine] Hives   Zocor [Simvastatin] Other (See Comments)    2012 "Muscle breakdown " with profuse sweating   Tramadol Nausea And Vomiting   Atorvastatin Other (See Comments)    Pt reports muscle aches and joint pains   Cefuroxime Other (See Comments)   Lactose Intolerance (Gi) Diarrhea   Latex     blisters   Lime Flavor [Flavoring Agent] Diarrhea   Metformin And Related Diarrhea   Oxycodone Hcl Other (See Comments)   Pravastatin Sodium Other (See Comments)   Rosuvastatin Other (See Comments)    Pt reports "myalgias, fatigue, nosebleeds."   Rosuvastatin Calcium Other (See Comments)   Tape Other (See Comments)    blisters   Outpatient Encounter Medications as of 03/29/2021  Medication Sig   acetaminophen (TYLENOL) 500 MG tablet Take 500 mg by mouth every 6 (six) hours as needed for mild pain.    albuterol (PROVENTIL) (2.5 MG/3ML) 0.083% nebulizer solution Take 3 mLs (2.5 mg total) by nebulization every 6 (six) hours as needed for wheezing or shortness of breath.   Calcium-Magnesium-Zinc (CAL-MAG-ZINC PO) Take 1 tablet by mouth daily.   cetirizine (ZYRTEC) 10 MG tablet TAKE 1 TABLET(10 MG) BY MOUTH DAILY   cholecalciferol (VITAMIN D) 1000 UNITS tablet Take 1,000 Units by mouth every morning.    clopidogrel (PLAVIX) 75 MG tablet TAKE 1 TABLET(75 MG) BY MOUTH DAILY   dapagliflozin propanediol (FARXIGA) 10 MG TABS tablet Take 1 tablet (10 mg total) by mouth daily before breakfast.   Evolocumab (REPATHA SURECLICK) 536 MG/ML SOAJ INJECT 1 PEN UNDER THE SKIN EVERY 14 DAYS   famotidine (PEPCID) 40 MG tablet TAKE 1 TABLET(40 MG) BY MOUTH DAILY  AS NEEDED FOR HEARTBURN OR INDIGESTION   glucose blood test strip Use as instructed   hydroxychloroquine (PLAQUENIL) 200 MG tablet Take 400 mg by mouth daily.    isosorbide mononitrate (IMDUR) 30 MG 24 hr tablet Take 3 tablets (90 mg total) by mouth daily.   Lancets (ONETOUCH ULTRASOFT) lancets Use as instructed   losartan  (COZAAR) 25 MG tablet TAKE 1 TABLET(25 MG) BY MOUTH DAILY   nebivolol (BYSTOLIC) 10 MG tablet TAKE 1 TABLET(10 MG) BY MOUTH DAILY   nitrofurantoin, macrocrystal-monohydrate, (MACROBID) 100 MG capsule Take 1 capsule (100 mg total) by mouth 2 (two) times daily.   nitroGLYCERIN (NITROSTAT) 0.4 MG SL tablet DISSOLVE 1 TABLET UNDER THE TONGUE EVERY 5 MINUTES AS NEEDED FOR CHEST PAIN   Polyethyl Glycol-Propyl Glycol (SYSTANE) 0.4-0.3 % GEL ophthalmic gel Place 1 application into both eyes daily as needed (for dry eyes).    pregabalin (LYRICA) 75 MG capsule Take 1 capsule (75 mg total) by mouth 3 (three) times daily.   psyllium (METAMUCIL) 58.6 % powder Take 1 packet by mouth daily.   ranolazine (RANEXA) 500 MG 12 hr tablet Take 1 tablet (500 mg total) by mouth 2 (two) times daily.   SYMBICORT 80-4.5 MCG/ACT inhaler INHALE 2 PUFFS INTO THE LUNGS TWICE DAILY   torsemide (DEMADEX) 20 MG tablet Take one tablet by mouth every other day   VENTOLIN HFA 108 (90 Base) MCG/ACT inhaler INHALE 2 PUFFS INTO THE LUNGS EVERY 6 HOURS AS NEEDED FOR WHEEZING OR SHORTNESS OF BREATH   No facility-administered encounter medications on file as of 03/29/2021.   Patient Active Problem List   Diagnosis Date Noted   Urinary frequency 02/21/2021   Occipital neuralgia of left side 02/21/2021   CKD (chronic kidney disease) stage 3, GFR 30-59 ml/min (HCC) 02/20/2021   Spinal stenosis of cervical region 02/16/2021   Rheumatoid arthritis ([place]) - Dr Dossie Der 10/20/2020   Neuropathy 10/19/2020   Vitamin D deficiency 04/13/2020   S/P TKR (total knee replacement), 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   Aortic atherosclerosis ([place]) 11/03/2019   Posterior vitreous detachment of right eye 07/15/2019   Posterior vitreous detachment of left eye 07/15/2019   Diabetes mellitus without complication ([place]) 81/82/9937   Recurrent corneal erosion, 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   Raynaud phenomenon 05/06/2019   Trigger finger, left ring finger 03/18/2019   Status post lumbar  spine operative procedure for decompression of spinal cord 01/28/2018   Sacral back pain 10/16/2017   Chronic left upper quadrant pain 07/23/2017   Poor balance 03/14/2017   Angina pectoris ([place]) 03/22/2016   Hypertensive heart disease 07/07/2015   Essential hypertension 07/07/2015   Lightheadedness 07/07/2015   Plantar fasciitis, left 03/22/2015   Coronary artery disease due to lipid rich plaque 01/05/2015   Chronic diastolic CHF (congestive heart failure), NYHA class 2 ([place]) 01/05/2015   Malignant neoplasm of ascending colon  pT1, pN0, rM0 s/p robotic colectomy 11/11/2014 11/11/2014   Migraine ([place]) 01/05/2014   VBI (vertebrobasilar insufficiency) 08/22/2012   TIA (transient ischemic attack) 06/27/2012   DJD (degenerative joint disease) 02/02/2011   Varicose veins of legs 06/06/2010   Palpitations 11/23/2008   UTI'S, RECURRENT 09/28/2008   Fibromyalgia 08/15/2007   DM II (diabetes mellitus, type II), w/ neuropathy 05/21/2006   Hyperlipidemia 05/21/2006   Asthma 01/29/2002   Conditions to be addressed/monitored:  CHF and DMII  Care Plan : RN Care Manager Plan of Care  Updates made by Knox Royalty, RN since 03/29/2021 12:00 AM     Problem: Chronic Disease  Management Needs   Priority: High     Long-Range Goal: Ongoing adherence to established plan of care for long term chronic disease management   Start Date: 01/24/2021  Expected End Date: 01/24/2022  Priority: High  Note:   Current Barriers:  Chronic Disease Management support and education needs related to CHF and DMII Chronic pain- well managed with medications Caregiver for daughter, has cancer currently under control; patient's brother currently has cancer and patient is periodically his caregiver- brother lives in [place], [place]: 01/24/21- patient declines need for caregiver resources/ support  RNCM Clinical Goal(s):  Patient will demonstrate ongoing health management independence as evidenced by adherence to plan  of care for CHF/ DMII        through collaboration with RN Care manager, provider, and care team.   Interventions: 1:1 collaboration with primary care provider regarding development and update of comprehensive plan of care as evidenced by provider attestation and co-signature Inter-disciplinary care team collaboration (see longitudinal plan of care) Evaluation of current treatment plan related to  self management and patient's adherence to plan as established by provider Initial assessment completed 01/24/21 03/29/21: Discussed ongoing management of chronic pain: patient adherent to prescribed medications; reports independently manages pain, prescribed medications and tylenol "help a little bit;" "just can't wait to see neurologist later this month" Confirmed patient took previously prescribed antibiotic for UTI in late January 2023; did take probiotic with antibiotic: confirms today she believes this is resolved; reports trying to stay hydrated; previously provided education around UTI signs/ symptoms/ prevention/ management reinforced Reviewed rheumatology provider office visit 03/28/21- reports she has been placed on short-term course of prednisone; has started taking as prescribed Falls assessment updated: Confirmed no new/ recent falls, continues using cane; reports today she did receive new rollator walker and is using-- reports pain in head/ neck, along with constant "buzzing sensation" in head has caused several "near" falls; reports she is "not nearly as steady as I used to be;" previously provided education around fall risk/ prevention reinforced Reports she has recently had her exterior ramp off her home upgraded/ replaced which has "helped" her feel safe and manage mobility SDOH updated: no new concerns/ unmet needs identified Pain assessment updated, as above Reviewed upcoming scheduled provider appointments and confirmed patient has plans to attend as scheduled; her sister/ daughter are  currently assisting with transportation due to her ongoing neck pain:  04/11/21- orthopedic follow up visit- she plans to cancel this appointment and re-schedule after new patient neurology office visit, as Dr. Lorin Mercy has told her there is nothing he can do until neurology provider makes diagnosis/ provides plan 04/17/21- neurology provider office visit 04/20/21- cardiology provider office visit Reports increased episodes of rectal bleeding, presumably from hemorrhoids, which she has had in past: planning to make GI provider appointment, which was necouraged; we also discussed OTC medications she can consider taking  Heart Failure Interventions:  (Status: 03/29/21 Goal on Track (progressing): YES.)  Long Term Goal  Basic overview and discussion of pathophysiology of Heart Failure reviewed Discussed importance of daily weight and advised patient to weigh and record daily Reviewed role of diuretics in prevention of fluid overload and management of heart failure Discussed the importance of keeping all appointments with provider Confirmed patient continues to monitor/ records daily weights at home: she reports weighs "holding steady" and reports consistent weights between 195-196 lbs, with a recent weight of "196 lbs;" confirmed no shortness of breath, no swelling over baseline Reinforced previously provided education around action plan for  weight gain; she verbalizes a good general understanding of standard guideline to report weight gain > 3 lbs overnight/ 5 lbs in one week Discussed ongoing use of diuretics: patient has history of dehydration: takes diuretics every-other- day as prescribed; verbalizes very good baseline understanding of purpose of diuretics, as well as signs/ symptoms dehydration-- reviewed previously provided education Confirmed patient monitors/ writes down on paper blood pressures at home: she reports consistent values at home between 140-170/ 80's: reports blood pressure this morning of  "129/88;" states "blood pressures have been under control lately" Confirmed patient currently remains not active due to ongoing pain- can not tolerate much activity  Diabetes:  (Status: 03/29/21: Goal on Track (progressing): YES.) Long Term Goal   Lab Results  Component Value Date   HGBA1C 6.6 (H) 02/21/2021  Discussed plans with patient for ongoing care management follow up and provided patient with direct contact information for care management team;      Review of patient status, including review of consultants reports, relevant laboratory and other test results, and medications completed;       Reviewed recent blood sugars at home: reports consistent fasting values between 90-110; reports blood sugar this morning of "90;" confirmed today no recent significantly low or high values; provided education around possibility of blood sugar increases with current short term prednisone therapy: she is aware of this from being on prednisone in the past Confirmed appetite "okay; occasionally poor;" however, patient is trying to eat and stay hydrated; sister and family members assisting with meals; she continues to receive meals on wheels  Patient Goals/Self-Care Activities: As evidenced by review of EHR, collaboration with care team, and patient reporting during CCM RN CM outreach,  Patient [place] will: Take medications as prescribed Attend all scheduled provider appointments Call pharmacy for medication refills Call provider office for new concerns or questions Continue to check fasting (first thing in the morning, before eating) blood sugars at home: the values we reviewed today are all in good range Continue monitoring your daily weights and blood pressures at home: the values we reviewed today are all in good range Continue to follow heart healthy, low salt, low cholesterol, carbohydrate-modified, low sugar diet Keep up the great work preventing falls at home- I am so glad you got and are using  your new rollator walker If your hemorrhoids continue to bother you, please make an appointment with the GI doctor, as you are planning to do     Plan: Telephone follow up appointment with care management team member scheduled for: Thursday, May 04, 2021 at 9:45 am The patient has been provided with contact information for the care management team and has been advised to call with any health related questions or concerns  Oneta Rack, RN, BSN, [place] [phone]: direct office

## 2021-04-08 ENCOUNTER — Telehealth: Payer: Self-pay | Admitting: *Deleted

## 2021-04-08 NOTE — Telephone Encounter (Signed)
1 year Ortho bundle call to patient completed for R-TKA per Dr.Yates. ?

## 2021-04-11 ENCOUNTER — Ambulatory Visit: Payer: Medicare Other | Admitting: Orthopaedic Surgery

## 2021-04-17 ENCOUNTER — Ambulatory Visit (INDEPENDENT_AMBULATORY_CARE_PROVIDER_SITE_OTHER): Payer: Medicare Other | Admitting: Neurology

## 2021-04-17 ENCOUNTER — Encounter: Payer: Self-pay | Admitting: Neurology

## 2021-04-17 ENCOUNTER — Telehealth: Payer: Self-pay | Admitting: Neurology

## 2021-04-17 VITALS — BP 157/78 | HR 67 | Ht 62.0 in | Wt 198.0 lb

## 2021-04-17 DIAGNOSIS — G43709 Chronic migraine without aura, not intractable, without status migrainosus: Secondary | ICD-10-CM

## 2021-04-17 DIAGNOSIS — I2583 Coronary atherosclerosis due to lipid rich plaque: Secondary | ICD-10-CM | POA: Diagnosis not present

## 2021-04-17 DIAGNOSIS — G4486 Cervicogenic headache: Secondary | ICD-10-CM | POA: Insufficient documentation

## 2021-04-17 DIAGNOSIS — I2510 Atherosclerotic heart disease of native coronary artery without angina pectoris: Secondary | ICD-10-CM

## 2021-04-17 DIAGNOSIS — R519 Headache, unspecified: Secondary | ICD-10-CM | POA: Insufficient documentation

## 2021-04-17 DIAGNOSIS — M542 Cervicalgia: Secondary | ICD-10-CM | POA: Insufficient documentation

## 2021-04-17 MED ORDER — NURTEC 75 MG PO TBDP: ORAL_TABLET | ORAL | 11 refills | Status: DC

## 2021-04-17 MED ORDER — PREGABALIN 75 MG PO CAPS: 75.0000 mg | ORAL_CAPSULE | Freq: Three times a day (TID) | ORAL | 5 refills | Status: DC

## 2021-04-17 MED ORDER — DULOXETINE HCL 60 MG PO CPEP: 60.0000 mg | ORAL_CAPSULE | Freq: Every day | ORAL | 11 refills | Status: DC

## 2021-04-17 MED ORDER — AIMOVIG 70 MG/ML ~~LOC~~ SOAJ: 70.0000 mg | SUBCUTANEOUS | 11 refills | Status: DC

## 2021-04-17 NOTE — Progress Notes (Signed)
? ?Chief Complaint  ?Patient presents with  ? New Patient (Initial Visit)  ?  Rm 15. Alone. ?NX Samantha [name] 2017/Internal referral for severe neck pain with radicular symptoms. ?C/o constant headache when turning neck. C/o buzzing in ears, pain behind left eye.  ? ? ? ? ?ASSESSMENT AND PLAN ? ?Samantha [name] is a 81 y.o. female   ? ?Severe left-sided neck pain, radiating pain to left occipital region, left retro-orbital area, ?known history of migraine headaches, ? Most likely cervicogenic pain due to her severe cervical degenerative disease, rheumatoid arthritis, history of cervical decompression surgery, MRI at orthopedic clinic in January 2023 showed significant pathology, but I do not have the films or report. ? Her headache does has migraine features, will add on preventive medication Aimovig 70 mg every month as preventive medications, Nurtec as needed for moderate to severe pain, not a good candidate for NSAIDs due to history of chronic kidney disease, using Tylenol as needed, heating pad, ? Will refer to [place] [facility] pain management for potential injection ? ESR C-reactive protein to rule out temporal arteritis ? Patient is very much worried about persistent left-sided headaches, desire MRI of the brain to rule out structural abnormality ? Add on Cymbalta 60 mg for musculoskeletal pain, anxiety, significant family stress ? Return to clinic in 3 months ? ? ?DIAGNOSTIC DATA (LABS, IMAGING, TESTING) ?- I reviewed patient records, labs, notes, testing and imaging myself where available. ? ? ?MEDICAL HISTORY: ? ?Samantha [name] is a 81 year old female, seen in request by orthopedic surgeon Rodell Perna C, for evaluation of severe neck pain, radiating pain to occipital region, frequent headaches, her primary care physician is Dr. Quay Burow, [facility] ? ?I reviewed and summarized the referring note.PMHX ?RA, ?TIA ?DM ?Chronic kidney disease. ?HTN ?Cervical decompression surgery in 2000, prior to surgery, she has  significant right cervical radiculopathy ? ?She did have a history of cervical decompression surgery in 2000, prior to the surgery, she presented with right cervical radiculopathy, surgery did help her symptoms significantly ? ?Since January 2023, she began to noticed significant headaches, no clear trigger noted, she now complains of neck pain, radiating pain to left occipital region, then settled behind her left eye, worsening bilateral ear tinnitus, her left side neck pain headache happening on a daily basis, ? ?She also seems to have worsening left-sided difficulty, left leg gave out underneath her, dragging left toes, rely on a walker now, also had worsening urinary urgency involving bowel and bladder, ? ?I personally reviewed x-ray cervical region December 2022, 3 level cervical fusion C3-4, adjacent C6-7 disc space narrowing and spondylosis ? ?She also have significant low back pain, is under the care of of Dr. Lorin Mercy, I personally reviewed MRI of lumbar November 2019, severe canal stenosis L4-5, multilevel degenerative changes, neuroforaminal narrowing at all levels, multilevel extraforaminal disc protrusion that may affect to the accelerated L T12, right L2, right L3 nerve roots ? ?She also complains of significant stress, she lives with her daughter, who suffered endometriosis cancer ?  ?PHYSICAL EXAM: ?  ?Vitals:  ? 04/17/21 1113  ?BP: (!) 157/78  ?Pulse: 67  ?Weight: 198 lb (89.8 kg)  ?Height: $RemoveB'5\' 2"'[id number]$  (1.575 m)  ? ?Not recorded ?  ? ? ?Body mass index is 36.21 kg/m?. ? ?PHYSICAL EXAMNIATION: ? ?Gen: NAD, conversant, well nourised, well groomed                     ?Cardiovascular: Regular rate rhythm, no peripheral edema, warm,  nontender. ?Eyes: Conjunctivae clear without exudates or hemorrhage ?Neck: Limited range of motion of neck, frequent sharp radiating pain from left neck to occipital region, caught her breath, ?Pulmonary: Clear to auscultation bilaterally  ? ?NEUROLOGICAL EXAM: ? ?MENTAL  STATUS: ?Speech: ?   Speech is normal; fluent and spontaneous with normal comprehension.  ?Cognition: ?    Orientation to time, place and person ?    Normal recent and remote memory ?    Normal Attention span and concentration ?    Normal Language, naming, repeating,spontaneous speech ?    Fund of knowledge ?  ?CRANIAL NERVES: ?CN II: Visual fields are full to confrontation. Pupils are round equal and briskly reactive to light. ?CN III, IV, VI: extraocular movement are normal. No ptosis. ?CN V: Facial sensation is intact to light touch ?CN VII: Face is symmetric with normal eye closure  ?CN VIII: Hearing is normal to causal conversation. ?CN IX, X: Phonation is normal. ?CN XI: Head turning and shoulder shrug are intact ? ?MOTOR: She has mild left finger abduction weakness, rest of the muscle testing move within normal limit. ? ?REFLEXES: ?Reflexes are 1 and symmetric at the biceps, triceps, knees, and ankles. Plantar responses are extensor bilaterally ? ?SENSORY: Mildly less dependent decreased vibratory sensation, pinprick, light touch, worse on left side to distal shin level ? ?COORDINATION: ?There is no trunk or limb dysmetria noted. ? ?GAIT/STANCE: She needs push-up to get up from seated position, wide-based, stiff, cautious, positive Romberg sign ? ?REVIEW OF SYSTEMS:  ?Full 14 system review of systems performed and notable only for as above ?All other review of systems were negative. ? ? ?ALLERGIES: ?Allergies  ?Allergen Reactions  ? Bactrim [Sulfamethoxazole-Trimethoprim] Other (See Comments)  ?  See OV 09-15-13, rash-tongue swelling due to bactrim ?  ? Cefuroxime Axetil Hives  ? Oxycodone Nausea And Vomiting  ? Pravastatin Other (See Comments)  ?  "muscle breakdown" with profuse sweating  ? Terfenadine Hives  ?  Other reaction(s): Other (See Comments)  ? Zocor [Simvastatin] Other (See Comments)  ?  2012 "Muscle breakdown " with profuse sweating  ? Tramadol Nausea And Vomiting  ? Atorvastatin Other (See  Comments)  ?  Pt reports muscle aches and joint pains  ? Cefuroxime Other (See Comments)  ? Lactose Intolerance (Gi) Diarrhea  ? Latex   ?  blisters  ? Lime Flavor [Flavoring Agent] Diarrhea  ? Metformin And Related Diarrhea  ? Oxycodone Hcl Other (See Comments)  ? Pravastatin Sodium Other (See Comments)  ? Rosuvastatin Other (See Comments)  ?  Pt reports "myalgias, fatigue, nosebleeds."  ? Rosuvastatin Calcium Other (See Comments)  ? Tape Other (See Comments)  ?  blisters  ? ? ?HOME MEDICATIONS: ?Current Outpatient Medications  ?Medication Sig Dispense Refill  ? acetaminophen (TYLENOL) 500 MG tablet Take 500 mg by mouth every 6 (six) hours as needed for mild pain.     ? albuterol (PROVENTIL) (2.5 MG/3ML) 0.083% nebulizer solution Take 3 mLs (2.5 mg total) by nebulization every 6 (six) hours as needed for wheezing or shortness of breath. 150 mL 0  ? Calcium-Magnesium-Zinc (CAL-MAG-ZINC PO) Take 1 tablet by mouth daily.    ? cetirizine (ZYRTEC) 10 MG tablet TAKE 1 TABLET(10 MG) BY MOUTH DAILY 90 tablet 3  ? cholecalciferol (VITAMIN D) 1000 UNITS tablet Take 1,000 Units by mouth every morning.     ? clopidogrel (PLAVIX) 75 MG tablet TAKE 1 TABLET(75 MG) BY MOUTH DAILY 90 tablet 1  ? dapagliflozin propanediol (  FARXIGA) 10 MG TABS tablet Take 1 tablet (10 mg total) by mouth daily before breakfast. 30 tablet 5  ? Evolocumab (REPATHA SURECLICK) 224 MG/ML SOAJ INJECT 1 PEN UNDER THE SKIN EVERY 14 DAYS 6 mL 3  ? famotidine (PEPCID) 40 MG tablet TAKE 1 TABLET(40 MG) BY MOUTH DAILY AS NEEDED FOR HEARTBURN OR INDIGESTION 30 tablet 5  ? glucose blood test strip Use as instructed 100 each 12  ? hydroxychloroquine (PLAQUENIL) 200 MG tablet Take 400 mg by mouth daily.     ? isosorbide mononitrate (IMDUR) 30 MG 24 hr tablet Take 3 tablets (90 mg total) by mouth daily. 270 tablet 1  ? Lancets (ONETOUCH ULTRASOFT) lancets Use as instructed 100 each 12  ? losartan (COZAAR) 25 MG tablet TAKE 1 TABLET(25 MG) BY MOUTH DAILY 90 tablet 3   ? nebivolol (BYSTOLIC) 10 MG tablet TAKE 1 TABLET(10 MG) BY MOUTH DAILY 90 tablet 2  ? nitroGLYCERIN (NITROSTAT) 0.4 MG SL tablet DISSOLVE 1 TABLET UNDER THE TONGUE EVERY 5 MINUTES AS NEEDED FOR CHEST PAIN 25 tablet 7  ?

## 2021-04-17 NOTE — Progress Notes (Deleted)
?Cardiology Office Note:   ? ?Date:  04/17/2021  ? ?ID:  Samantha [name], DOB 1940-09-29, MRN [id number] ? ?PCP:  [name], MD ?  ?[facility] Providers ?Cardiologist:  Freada Bergeron, MD ?Cardiology APP:  Liliane Shi, PA-C { ?  ? ?Referring MD: [name], MD  ? ? ?History of Present Illness:   ? ?Samantha [name] is a 81 y.o. female with a hx of moderate nonobstructive CAD by cath in 1093, chronic diastolic HF, HTN, HLD, DMII, symptomatic PVCs, reactive airway disease and history of TIA who was previously followed by [name] who now presents to clinic for follow-up. ? ?Per review of the record, patient underwent cath in 03/2016 which showed 40% mid RCA stenosis and 50% mid LAD stenosis. The patient continued to have intermittent exertional and resting chest pain that has resolved after she was started on Imdur 30 mg daily. ? ?Saw [name] in 10/2019 prior to surgery for knee replacement. Had myoview 10/2019 which showed no evidence of ischemia or infarction and normal LVEF. TTE 10/2019 with LVEF 60-65%, normal strain, mild dilation of ascending aorta 34m. ? ?Saw me in 07/2020 where she was having intermittent chest discomfort. Imdur was increased at that time. Cardiac monitor with with rare ectopy, 1 run of SVT with longest lasting 15 beats.  ? ?Last seen in clinic on 09/2020 where she was having more chest pain. Resumed on ranexa '500mg'$  BID. ? ?Today, *** ? ?Past Medical History:  ?Diagnosis Date  ? A-fib ([facility]   ? Abnormal CT of the chest 2008  ? last CT4-l 2009:  . No f/u suggested   ? Allergy   ? Asthma   ? CAD (coronary artery disease)   ? a. Coronary CTA 10/16: Coronary Ca score 211, mod non-obstructive CAD with LM mild plaque (25-50%), mid LAD 50-69%. b. Neg nuc 06/2015.  ? Cataract   ? BILATERAL-REMOVED  ? Chronic diastolic CHF (congestive heart failure) ([facility]   ? Collagen vascular disease ([facility]   ? "arterial sclerosis" per pt  ? Complication of anesthesia   ? trouble waking  up  ? Fibromyalgia   ? GERD (gastroesophageal reflux disease)   ? H/O hiatal hernia   ? Heart murmur   ? Hyperlipidemia   ? Hypertension   ? 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 Minimally invasive right hemicolectomy to be done   ? Neuromuscular disorder ([place]   ? FIBROMYALGIA  ? Ocular migraine   ? OSA (obstructive sleep apnea) 09/2007  ? dx w/ a sleep study, not on  CPAP  ? Osteoarthritis   ? Osteoporosis   ? Pneumonia   ? "double" in 2004  ? PONV (postoperative nausea and vomiting)   ? Reactive airway disease 01/29/2002  ? dx of pseudoasthma / vcd in 2005 and nl sprirometry History of dyspnea, 2011,  improved after several medications were changed around Question of COPD, disproved July 06, 2009 with nl pft's      ? Rheumatoid factor positive   ? Shingles 11/2009  ? Sleep apnea   ? [facility]   ? TIA (transient ischemic attack)   ? x2 - on Plavix for this  ? Torn rotator cuff   ? right worse than left, both are torn  ? Tumor, thyroid   ? partial thyroidectomy in the 60s  ? Type II diabetes mellitus ([facility]   ? [place]aginal cancer ([facility] 1994  ? Vaginal dysplasia   ? ? ?  Past Surgical History:  ?Procedure Laterality Date  ? ABDOMINAL HYSTERECTOMY  1980  ? NO oophorectomy per pt   ? ANTERIOR CERVICAL DECOMP/DISCECTOMY FUSION  2001  ? C 3, C4 and C5 plate and screws  ? BREAST BIOPSY Right 1999  ? BUNIONECTOMY Left ~ 1977  ? CARDIAC CATHETERIZATION    ? 2018 By Dr. Pernell Dupre (done after colon surgery)  ? CATARACT EXTRACTION W/ INTRAOCULAR LENS  IMPLANT, BILATERAL  2012  ? COLON SURGERY  10/2014  ? EYE SURGERY Bilateral   ? torq lens for cataracts  ? LEFT HEART CATH AND CORONARY ANGIOGRAPHY N/A 03/22/2016  ? Procedure: Left Heart Cath and Coronary Angiography;  Surgeon: Belva Crome, MD;  Location: [facility] [facility];  Service: Cardiovascular;  Laterality: N/A;  ? LUMBAR LAMINECTOMY/DECOMPRESSION MICRODISCECTOMY N/A 01/20/2018  ? Procedure: L4-5 decompression;  Surgeon: Marybelle Killings, MD;  Location: [place];   Service: Orthopedics;  Laterality: N/A;  ? THYROIDECTOMY, PARTIAL  1960's  ? TOTAL KNEE ARTHROPLASTY Right 12/04/2019  ? Procedure: RIGHT TOTAL KNEE ARTHROPLASTY;  Surgeon: Marybelle Killings, MD;  Location: [place];  Service: Orthopedics;  Laterality: Right;  RNFA APRIL PLEASE  ? VAGINAL MASS EXCISION  1994  ? "Laser surgery for vaginal cancer; followed by chemotherapy" (06/27/2012)  ? ? ?Current Medications: ?No outpatient medications have been marked as taking for the 04/20/21 encounter (Appointment) with Freada Bergeron, MD.  ?  ? ?Allergies:   Bactrim [sulfamethoxazole-trimethoprim], Cefuroxime axetil, Oxycodone, Pravastatin, Terfenadine, Zocor [simvastatin], Tramadol, Atorvastatin, Cefuroxime, Lactose intolerance (gi), Latex, Lime flavor [flavoring agent], Metformin and related, Oxycodone hcl, Pravastatin sodium, Rosuvastatin, Rosuvastatin calcium, and Tape  ? ?Social History  ? ?Socioeconomic History  ? Marital status: Married  ?  Spouse name: Ilona Sorrel  ? Number of children: 2  ? Years of education: masters  ? Highest education level: Not on file  ?Occupational History  ? Occupation: Retired, disable since 2000  ?  Employer: RETIRED  ?Tobacco Use  ? Smoking status: Former  ?  Packs/day: 0.25  ?  Years: 5.00  ?  Pack years: 1.25  ?  Types: Cigarettes  ?  Quit date: 01/29/1998  ?  Years since quitting: 23.2  ? Smokeless tobacco: Never  ? Tobacco comments:  ?  Quit in 2001  ?Vaping Use  ? Vaping Use: Never used  ?Substance and Sexual Activity  ? Alcohol use: No  ?  Alcohol/week: 0.0 standard drinks  ? Drug use: No  ? Sexual activity: Never  ?Other Topics Concern  ? Not on file  ?Social History Narrative  ? On disability since 2000--- also husband has MS  ? Education. College.  ? Right handed.  ? ?Social Determinants of Health  ? ?Financial Resource Strain: Low Risk   ? Difficulty of Paying Living Expenses: Not hard at all  ?Food Insecurity: No Food Insecurity  ? Worried About Charity fundraiser in the Last Year: Never  true  ? Ran Out of Food in the Last Year: Never true  ?Transportation Needs: No Transportation Needs  ? Lack of Transportation (Medical): No  ? Lack of Transportation (Non-Medical): No  ?Physical Activity: Insufficiently Active  ? Days of Exercise per Week: 4 days  ? Minutes of Exercise per Session: 30 min  ?Stress: No Stress Concern Present  ? Feeling of Stress : Not at all  ?Social Connections: Moderately Isolated  ? Frequency of Communication with Friends and Family: Twice a week  ? Frequency of Social Gatherings  with Friends and Family: Twice a week  ? Attends Religious Services: More than 4 times per year  ? Active Member of Clubs or Organizations: No  ? Attends Archivist Meetings: Never  ? Marital Status: Widowed  ?  ? ?Family History: ?The patient's family history includes Allergies in her sister; Aneurysm in her brother; Asthma in her paternal grandmother and sister; Diabetes in her brother and brother; Emphysema in her brother; Heart disease in her brother, father, mother, and another family member; Kidney failure in her brother; Lung cancer in her mother; Parkinsonism in her sister; Stroke in her brother, maternal grandmother, and paternal grandmother. There is no history of Breast cancer, Colon cancer, Heart attack, Esophageal cancer, Rectal cancer, or Stomach cancer. ? ?ROS:   ?Please see the history of present illness.    ?Review of Systems  ?Constitutional:  Negative for chills and fever.  ?HENT:  Negative for sore throat.   ?Eyes:  Negative for blurred vision.  ?Respiratory:  Positive for shortness of breath and wheezing.   ?Cardiovascular:  Positive for chest pain and palpitations. Negative for orthopnea, claudication, leg swelling and PND.  ?Gastrointestinal:  Positive for heartburn.  ?Genitourinary:  Negative for hematuria.  ?Musculoskeletal:  Positive for joint pain and myalgias.  ?Neurological:  Negative for dizziness and loss of consciousness.  ?Psychiatric/Behavioral:  Negative for  substance abuse.    ? ?EKGs/Labs/Other Studies Reviewed:   ? ?The following studies were reviewed today: ?Echocardiogram 09/20/20 ?EF 65-70, no RWMA, mild LVH, normal RVSF, trivial MR, mild AV calcificatio

## 2021-04-17 NOTE — Telephone Encounter (Signed)
Medicare/aarp/tricare order sent to [facility] they will reach out to the patient to schedule.  ?

## 2021-04-18 ENCOUNTER — Telehealth: Payer: Self-pay | Admitting: Neurology

## 2021-04-18 LAB — SEDIMENTATION RATE: Sed Rate: 9 mm/hr (ref 0–40)

## 2021-04-18 LAB — C-REACTIVE PROTEIN: CRP: 19 mg/L — ABNORMAL HIGH (ref 0–10)

## 2021-04-18 NOTE — Telephone Encounter (Signed)
Sent to [facility] ph # [phone]. 

## 2021-04-19 ENCOUNTER — Telehealth: Payer: Self-pay

## 2021-04-19 NOTE — Progress Notes (Signed)
?Cardiology Office Note:   ? ?Date:  04/20/2021  ? ?ID:  Samantha [name], DOB [birth year], MRN [id number] ? ?PCP:  [name], MD ?  ?[facility] Providers ?Cardiologist:  Freada Bergeron, MD ?Cardiology APP:  Liliane Shi, PA-C  ?{ ?  ? ?Referring MD: [name], MD  ? ? ?History of Present Illness:   ? ?Samantha [name] is a 81 y.o. female with a hx of moderate nonobstructive CAD by cath in 0962, chronic diastolic HF, HTN, HLD, DMII, symptomatic PVCs, reactive airway disease and history of TIA who was previously followed by [name] who now presents to clinic for follow-up. ? ?Per review of the record, patient underwent cath in 03/2016 which showed 40% mid RCA stenosis and 50% mid LAD stenosis. The patient continued to have intermittent exertional and resting chest pain that has resolved after she was started on Imdur 30 mg daily. ? ?Saw [name] in 10/2019 prior to surgery for knee replacement. Had myoview 10/2019 which showed no evidence of ischemia or infarction and normal LVEF. TTE 10/2019 with LVEF 60-65%, normal strain, mild dilation of ascending aorta 72m. ? ?Saw me in 07/2020 where she was having intermittent chest discomfort. Imdur was increased at that time. Cardiac monitor with with rare ectopy, 1 run of SVT with longest lasting 15 beats.  ? ?Last seen in clinic on 09/2020 where she was having more chest pain. Resumed on ranexa '500mg'$  BID. ? ?Today, she is doing well. However, she had an episode of occipital neuralgia affecting her vision last week. She continues to experience a splitting headache and imbalance. She is scheduled for an MRI next week. The headache is improved since last week with medications. She is compliant with her medications.  ? ?She also had 2 episodes of chest pain and has only needed her nitroglycerin once. Her palpitation episodes have also improved. She denies chest pressure, dyspnea at rest or with exertion, PND, orthopnea, or leg swelling.  ? ?Her  recent neuralgia episode and personal family health issues has been causing her stress. Her brother is in the last stages of bone cancer. The stress and pain is causing her blood pressure to fluctuate. She reports a good diet to control her blood sugar.  ? ?Past Medical History:  ?Diagnosis Date  ? A-fib ([place]   ? Abnormal CT of the chest 2008  ? last CT4-l 2009:  . No f/u suggested   ? Allergy   ? Asthma   ? CAD (coronary artery disease)   ? a. Coronary CTA 10/16: Coronary Ca score 211, mod non-obstructive CAD with LM mild plaque (25-50%), mid LAD 50-69%. b. Neg nuc 06/2015.  ? Cataract   ? BILATERAL-REMOVED  ? Chronic diastolic CHF (congestive heart failure) ([facility]   ? Collagen vascular disease ([facility]   ? "arterial sclerosis" per pt  ? Complication of anesthesia   ? trouble waking up  ? Fibromyalgia   ? GERD (gastroesophageal reflux disease)   ? H/O hiatal hernia   ? Heart murmur   ? Hyperlipidemia   ? Hypertension   ? Malignant [place]oplasm of ascending colon ([facility] 2016  ? Minimally invasive right hemicolectomy to be done   ? Neuromuscular disorder ([facility]   ? FIBROMYALGIA  ? Ocular migraine   ? OSA (obstructive sleep apnea) 09/2007  ? dx w/ a sleep study, not on  CPAP  ? Osteoarthritis   ? Osteoporosis   ? Pneumonia   ? "double" in 2004  ?  PONV (postoperative nausea and vomiting)   ? Reactive airway disease 01/29/2002  ? dx of pseudoasthma / vcd in 2005 and nl sprirometry History of dyspnea, 2011,  improved after several medications were changed around Question of COPD, disproved July 06, 2009 with nl pft's      ? Rheumatoid factor positive   ? Shingles 11/2009  ? Sleep apnea   ? Stroke [facility])   ? TIA (transient ischemic attack)   ? x2 - on Plavix for this  ? Torn rotator cuff   ? right worse than left, both are torn  ? Tumor, thyroid   ? partial thyroidectomy in the 60s  ? Type II diabetes mellitus ([place])   ? Vaginal cancer ([place]) 1994  ? Vaginal dysplasia   ? ? ?Past Surgical History:  ?Procedure Laterality Date  ? ABDOMINAL  HYSTERECTOMY  1980  ? NO oophorectomy per pt   ? ANTERIOR CERVICAL DECOMP/DISCECTOMY FUSION  2001  ? C 3, C4 and C5 plate and screws  ? BREAST BIOPSY Right 1999  ? BUNIONECTOMY Left ~ 1977  ? CARDIAC CATHETERIZATION    ? 2018 By Dr. Pernell Dupre (done after colon surgery)  ? CATARACT EXTRACTION W/ INTRAOCULAR LENS  IMPLANT, BILATERAL  2012  ? COLON SURGERY  10/2014  ? EYE SURGERY Bilateral   ? torq lens for cataracts  ? LEFT HEART CATH AND CORONARY ANGIOGRAPHY N/A 03/22/2016  ? Procedure: Left Heart Cath and Coronary Angiography;  Surgeon: Belva Crome, MD;  Location: [place] [facility];  Service: Cardiovascular;  Laterality: N/A;  ? LUMBAR LAMINECTOMY/DECOMPRESSION MICRODISCECTOMY N/A 01/20/2018  ? Procedure: L4-5 decompression;  Surgeon: Marybelle Killings, MD;  Location: [place];  Service: Orthopedics;  Laterality: N/A;  ? THYROIDECTOMY, PARTIAL  1960's  ? TOTAL KNEE ARTHROPLASTY Right 12/04/2019  ? Procedure: RIGHT TOTAL KNEE ARTHROPLASTY;  Surgeon: Marybelle Killings, MD;  Location: [place];  Service: Orthopedics;  Laterality: Right;  RNFA APRIL PLEASE  ? VAGINAL MASS EXCISION  1994  ? "Laser surgery for vaginal cancer; followed by chemotherapy" (06/27/2012)  ? ? ?Current Medications: ?Current Meds  ?Medication Sig  ? acetaminophen (TYLENOL) 500 MG tablet Take 500 mg by mouth every 6 (six) hours as needed for mild pain.   ? albuterol (PROVENTIL) (2.5 MG/3ML) 0.083% nebulizer solution Take 3 mLs (2.5 mg total) by nebulization every 6 (six) hours as needed for wheezing or shortness of breath.  ? Calcium-Magnesium-Zinc (CAL-MAG-ZINC PO) Take 1 tablet by mouth daily.  ? cetirizine (ZYRTEC) 10 MG tablet TAKE 1 TABLET(10 MG) BY MOUTH DAILY  ? cholecalciferol (VITAMIN D) 1000 UNITS tablet Take 1,000 Units by mouth every morning.   ? clopidogrel (PLAVIX) 75 MG tablet TAKE 1 TABLET(75 MG) BY MOUTH DAILY  ? DULoxetine (CYMBALTA) 60 MG capsule Take 1 capsule (60 mg total) by mouth daily.  ? Erenumab-aooe (AIMOVIG) 70 MG/ML SOAJ Inject 70  mg into the skin every 30 (thirty) days.  ? Evolocumab (REPATHA SURECLICK) 253 MG/ML SOAJ INJECT 1 PEN UNDER THE SKIN EVERY 14 DAYS  ? famotidine (PEPCID) 40 MG tablet TAKE 1 TABLET(40 MG) BY MOUTH DAILY AS NEEDED FOR HEARTBURN OR INDIGESTION  ? FARXIGA 10 MG TABS tablet TAKE 1 TABLET(10 MG) BY MOUTH DAILY BEFORE BREAKFAST  ? glucose blood test strip Use as instructed  ? hydroxychloroquine (PLAQUENIL) 200 MG tablet Take 400 mg by mouth daily.   ? isosorbide mononitrate (IMDUR) 30 MG 24 hr tablet Take 3 tablets (90 mg total) by mouth  daily.  ? Lancets (ONETOUCH ULTRASOFT) lancets Use as instructed  ? losartan (COZAAR) 25 MG tablet TAKE 1 TABLET(25 MG) BY MOUTH DAILY  ? nebivolol (BYSTOLIC) 10 MG tablet TAKE 1 TABLET(10 MG) BY MOUTH DAILY  ? nitroGLYCERIN (NITROSTAT) 0.4 MG SL tablet DISSOLVE 1 TABLET UNDER THE TONGUE EVERY 5 MINUTES AS NEEDED FOR CHEST PAIN  ? Polyethyl Glycol-Propyl Glycol (SYSTANE) 0.4-0.3 % GEL ophthalmic gel Place 1 application into both eyes daily as needed (for dry eyes).   ? pregabalin (LYRICA) 75 MG capsule Take 1 capsule (75 mg total) by mouth 3 (three) times daily.  ? psyllium (METAMUCIL) 58.6 % powder Take 1 packet by mouth daily.  ? ranolazine (RANEXA) 500 MG 12 hr tablet Take 1 tablet (500 mg total) by mouth 2 (two) times daily.  ? Rimegepant Sulfate (NURTEC) 75 MG TBDP Take 1 tab at onset of migraine.  May repeat in 2 hrs, if needed.  Max dose: 2 tabs/day. This is a 30 day prescription.  ? SYMBICORT 80-4.5 MCG/ACT inhaler INHALE 2 PUFFS INTO THE LUNGS TWICE DAILY  ? torsemide (DEMADEX) 20 MG tablet Take one tablet by mouth every other day  ? VENTOLIN HFA 108 (90 Base) MCG/ACT inhaler INHALE 2 PUFFS INTO THE LUNGS EVERY 6 HOURS AS NEEDED FOR WHEEZING OR SHORTNESS OF BREATH  ?  ? ?Allergies:   Bactrim [sulfamethoxazole-trimethoprim], Cefuroxime axetil, Oxycodone, Pravastatin, Terfenadine, Zocor [simvastatin], Tramadol, Atorvastatin, Cefuroxime, Lactose intolerance (gi), Latex, Lime flavor  [flavoring agent], Metformin and related, Oxycodone hcl, Pravastatin sodium, Rosuvastatin, Rosuvastatin calcium, and Tape  ? ?Social History  ? ?Socioeconomic History  ? Marital status: Married  ?  Sp

## 2021-04-19 NOTE — Telephone Encounter (Signed)
PA for nurtec has been sent to [facility] via cmm and received instant approval. ? ? (Key: [id number]) ? ?Your information has been sent to [facility]. ?PA Case: [id number], Status: Approved, Coverage Starts on: 01/29/2021 12:00:00 AM, Coverage Ends on: 01/28/2022 12:00:00 AM. Questions? Contact [phone]. ?

## 2021-04-20 ENCOUNTER — Other Ambulatory Visit: Payer: Self-pay

## 2021-04-20 ENCOUNTER — Ambulatory Visit (INDEPENDENT_AMBULATORY_CARE_PROVIDER_SITE_OTHER): Payer: Medicare Other | Admitting: Cardiology

## 2021-04-20 ENCOUNTER — Other Ambulatory Visit: Payer: Self-pay | Admitting: Internal Medicine

## 2021-04-20 ENCOUNTER — Encounter: Payer: Self-pay | Admitting: Cardiology

## 2021-04-20 VITALS — BP 132/62 | HR 79 | Ht 62.0 in | Wt 205.0 lb

## 2021-04-20 DIAGNOSIS — I5032 Chronic diastolic (congestive) heart failure: Secondary | ICD-10-CM | POA: Diagnosis not present

## 2021-04-20 DIAGNOSIS — G459 Transient cerebral ischemic attack, unspecified: Secondary | ICD-10-CM

## 2021-04-20 DIAGNOSIS — R002 Palpitations: Secondary | ICD-10-CM | POA: Diagnosis not present

## 2021-04-20 DIAGNOSIS — I10 Essential (primary) hypertension: Secondary | ICD-10-CM | POA: Diagnosis not present

## 2021-04-20 DIAGNOSIS — I2510 Atherosclerotic heart disease of native coronary artery without angina pectoris: Secondary | ICD-10-CM

## 2021-04-20 DIAGNOSIS — I2583 Coronary atherosclerosis due to lipid rich plaque: Secondary | ICD-10-CM

## 2021-04-20 DIAGNOSIS — E782 Mixed hyperlipidemia: Secondary | ICD-10-CM

## 2021-04-20 NOTE — Patient Instructions (Signed)
Medication Instructions:  ? ?Your physician recommends that you continue on your current medications as directed. Please refer to the Current Medication list given to you today. ? ?*If you need a refill on your cardiac medications before your next appointment, please call your pharmacy* ? ? ? ?Follow-Up: ?At [facility], you and your health needs are our priority.  As part of our continuing mission to provide you with exceptional heart care, we have created designated Provider Care Teams.  These Care Teams include your primary Cardiologist (physician) and Advanced Practice Providers (APPs -  Physician Assistants and Nurse Practitioners) who all work together to provide you with the care you need, when you need it. ? ?We recommend signing up for the patient portal called "MyChart".  Sign up information is provided on this After Visit Summary.  MyChart is used to connect with patients for Virtual Visits (Telemedicine).  Patients are able to view lab/test results, encounter notes, upcoming appointments, etc.  Non-urgent messages can be sent to your provider as well.   ?To learn more about what you can do with MyChart, go to [email or web address].   ? ?Your next appointment:   ?6 month(s) ? ?The format for your next appointment:   ?In Person ? ?Provider:   ?Freada Bergeron, MD { ? ? ?

## 2021-04-24 DIAGNOSIS — M069 Rheumatoid arthritis, unspecified: Secondary | ICD-10-CM | POA: Diagnosis not present

## 2021-04-25 ENCOUNTER — Ambulatory Visit (INDEPENDENT_AMBULATORY_CARE_PROVIDER_SITE_OTHER): Payer: Medicare Other | Admitting: Orthopaedic Surgery

## 2021-04-25 ENCOUNTER — Other Ambulatory Visit: Payer: Self-pay

## 2021-04-25 ENCOUNTER — Encounter: Payer: Self-pay | Admitting: Orthopaedic Surgery

## 2021-04-25 VITALS — Ht 62.0 in | Wt 205.0 lb

## 2021-04-25 DIAGNOSIS — M4802 Spinal stenosis, cervical region: Secondary | ICD-10-CM

## 2021-04-25 DIAGNOSIS — I2583 Coronary atherosclerosis due to lipid rich plaque: Secondary | ICD-10-CM

## 2021-04-25 DIAGNOSIS — I2510 Atherosclerotic heart disease of native coronary artery without angina pectoris: Secondary | ICD-10-CM

## 2021-04-25 NOTE — Progress Notes (Signed)
? ?Office Visit Note ?  ?Patient: Samantha [name]           ?Date of Birth: [birth year]           ?MRN: [id number] ?Visit Date: 04/25/2021 ?             ?Requested by: [name], MD ?[place],  [place] [address] ?PCP: [name], MD ? ? ?Assessment & Plan: ?Visit Diagnoses:  ?1. Spinal stenosis of cervical region   ? ? ?Plan: Patient does have some moderate stenosis at C6-7 below her solid fusion.  Episodes with dizziness and headaches and intermittent staggering or intermittent balance problems are not likely to be due to moderate stenosis at C6-7.  Some days she walks better than others.  She will continue with neurology Follow-up and I can check her in 3 months or she can return earlier if she gets progressive radicular symptoms.  She has a brain MRI coming up next week. ? ?Follow-Up Instructions: Return in about 3 months (around 07/26/2021).  ? ?Orders:  ?No orders of the defined types were placed in this encounter. ? ?No orders of the defined types were placed in this encounter. ? ? ? ? Procedures: ?No procedures performed ? ? ?Clinical Data: ?No additional findings. ? ? ?Subjective: ?Chief Complaint  ?Patient presents with  ? Neck - Follow-up  ? ? ?HPI 81 year old female returns she has some moderate stenosis at C6-7 below her solid fusion.  She has seen Dr. Krista Blue neurology had recommended possible injections with CSNA.  She also has a brain MRI coming up with her associated headaches.  States she has had ambulate with a cane and her balance has been off.  When she turns she feels like she will fall and has not been driving.  2 days ago she states she has significant problems walking and use a walker but is walking better today.  Patient had some moderate stenosis at C6-7. ? ?Previous L4-5 decompression doing well.  Previous right total knee arthroplasty . ? ?Review of Systems all other systems noncontributory to HPI. ? ? ?Objective: ?Vital Signs: Ht '5\' 2"'$  (1.575 m)   Wt 205 lb (93 kg)    BMI 37.49 kg/m?  ? ?Physical Exam ?Constitutional:   ?   Appearance: She is well-developed.  ?HENT:  ?   Head: Normocephalic.  ?   Right Ear: External ear normal.  ?   Left Ear: External ear normal. There is no impacted cerumen.  ?Eyes:  ?   Pupils: Pupils are equal, round, and reactive to light.  ?Neck:  ?   Thyroid: No thyromegaly.  ?   Trachea: No tracheal deviation.  ?Cardiovascular:  ?   Rate and Rhythm: Normal rate.  ?Pulmonary:  ?   Effort: Pulmonary effort is normal.  ?Abdominal:  ?   Palpations: Abdomen is soft.  ?Musculoskeletal:  ?   Cervical back: No rigidity.  ?Skin: ?   General: Skin is warm and dry.  ?Neurological:  ?   Mental Status: She is alert and oriented to person, place, and time.  ?Psychiatric:     ?   Behavior: Behavior normal.  ? ? ?Ortho Exam well healed anterior incision.  Some brachial plexus tenderness on the left on the right.  Some relief with distraction and mild increase with compression.  No lower extremity clonus.  She is walking better today reflexes are 2+ and symmetrical. ? ?Specialty Comments:  ?No specialty comments available. ? ?Imaging: ?  No results found. ? ? ?PMFS History: ?Patient Active Problem List  ? Diagnosis Date Noted  ? Neck pain 04/17/2021  ? Intractable headache 04/17/2021  ? 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 Urinary frequency 02/21/2021  ? Occipital neuralgia of left side 02/21/2021  ? CKD (chronic kidney disease) stage 3, GFR 30-59 ml/min (HCC) 02/20/2021  ? Spinal stenosis of cervical region 02/16/2021  ? Rheumatoid arthritis ([place]) - Dr Dossie Der 10/20/2020  ? Neuropathy 10/19/2020  ? Vitamin D deficiency 04/13/2020  ? S/P TKR (total knee replacement), 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  ? Aortic atherosclerosis ([place]) 11/03/2019  ? Posterior vitreous detachment of right eye 07/15/2019  ? Posterior vitreous detachment of left eye 07/15/2019  ? Diabetes mellitus without complication ([place]) 14/43/1540  ? Recurrent corneal erosion, right  07/15/2019  ? Raynaud phenomenon 05/06/2019  ? Trigger finger, left ring finger 03/18/2019  ? 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 01/28/2018  ? Sacral back pain 10/16/2017  ? Chronic left upper quadrant pain 07/23/2017  ? Poor balance 03/14/2017  ? Angina pectoris ([place]) 03/22/2016  ? Hypertensive heart disease 07/07/2015  ? Essential hypertension 07/07/2015  ? Lightheadedness 07/07/2015  ? Plantar fasciitis, left 03/22/2015  ? Coronary artery disease due to lipid rich plaque 01/05/2015  ? Chronic diastolic CHF (congestive heart failure), NYHA class 2 ([place]) 01/05/2015  ? 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 Migraine (Ocular) 01/05/2014  ? VBI (vertebrobasilar insufficiency) 08/22/2012  ? TIA (transient ischemic attack) 06/27/2012  ? DJD (degenerative joint disease) 02/02/2011  ? Varicose veins of legs 06/06/2010  ? Palpitations 11/23/2008  ? UTI'S, RECURRENT 09/28/2008  ? Fibromyalgia 08/15/2007  ? DM II (diabetes mellitus, type II), w/ neuropathy 05/21/2006  ? Hyperlipidemia 05/21/2006  ? Asthma 01/29/2002  ? ?Past Medical History:  ?Diagnosis Date  ? A-fib ([place])   ? Abnormal CT of the chest 2008  ? last CT4-l 2009:  . No f/u suggested   ? Allergy   ? Asthma   ? CAD (coronary artery disease)   ? a. Coronary CTA 10/16: Coronary Ca score 211, mod non-obstructive CAD with LM mild plaque (25-50%), mid LAD 50-69%. b. Neg nuc 06/2015.  ? Cataract   ? BILATERAL-REMOVED  ? Chronic diastolic CHF (congestive heart failure) ([place])   ? Collagen vascular disease ([place])   ? "arterial sclerosis" per pt  ? Complication of anesthesia   ? trouble waking up  ? Fibromyalgia   ? GERD (gastroesophageal reflux disease)   ? H/O hiatal hernia   ? Heart murmur   ? Hyperlipidemia   ? Hypertension   ? Malignant neoplasm of ascending colon ([place]) 2016  ? Minimally invasive right hemicolectomy to be done   ? Neuromuscular disorder ([place])   ?  FIBROMYALGIA  ? Ocular migraine   ? OSA (obstructive sleep apnea) 09/2007  ? dx w/ a sleep study, not on  CPAP  ? Osteoarthritis   ? Osteoporosis   ? Pneumonia   ? "double" in 2004  ? PONV (postoperative nausea and vomiting)   ? Reactive airway disease 01/29/2002  ? dx of pseudoasthma / vcd in 2005 and nl sprirometry History of dyspnea, 2011,  improved after several medications were changed around Question of COPD, disproved July 06, 2009 with nl pft's      ? Rheumatoid factor positive   ? Shingles 11/2009  ? Sleep apnea   ? Stroke (  [place])   ? TIA (transient ischemic attack)   ? x2 - on Plavix for this  ? Torn rotator cuff   ? right worse than left, both are torn  ? Tumor, thyroid   ? partial thyroidectomy in the 60s  ? Type II diabetes mellitus ([place])   ? 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 Vaginal dysplasia   ?  ?Family History  ?Problem Relation Age of Onset  ? Heart disease Father   ? Heart disease Mother   ? Lung cancer Mother   ? Allergies Sister   ? Parkinsonism Sister   ?     possible  ? Asthma Sister   ? Asthma Paternal Grandmother   ? Stroke Paternal Grandmother   ? Heart disease Other   ?     paternal grandparents, maternal grandparents,   ? Heart disease Brother   ? Emphysema Brother   ? Aneurysm Brother   ?     x3  ? Kidney failure Brother   ? Diabetes Brother   ? Diabetes Brother   ? Stroke Brother   ? Stroke Maternal Grandmother   ? Breast cancer Neg Hx   ? Colon cancer Neg Hx   ? Heart attack Neg Hx   ? Esophageal cancer Neg Hx   ? Rectal cancer Neg Hx   ? Stomach cancer Neg Hx   ?  ?Past Surgical History:  ?Procedure Laterality Date  ? ABDOMINAL HYSTERECTOMY  1980  ? NO oophorectomy per pt   ? ANTERIOR CERVICAL DECOMP/DISCECTOMY FUSION  2001  ? C 3, C4 and C5 plate and screws  ? BREAST BIOPSY Right 1999  ? BUNIONECTOMY Left ~ 1977  ? CARDIAC CATHETERIZATION    ? 2018 By Dr. Pernell Dupre (done after colon surgery)  ? CATARACT EXTRACTION W/ INTRAOCULAR LENS  IMPLANT, BILATERAL  2012  ? COLON SURGERY  10/2014  ? EYE  SURGERY Bilateral   ? torq lens for cataracts  ? LEFT HEART CATH AND CORONARY ANGIOGRAPHY N/A 03/22/2016  ? Procedure: Left Heart Cath and Coronary Angiography;  Surgeon: Belva Crome, MD;  Location: [facility]

## 2021-04-26 DIAGNOSIS — E669 Obesity, unspecified: Secondary | ICD-10-CM | POA: Diagnosis not present

## 2021-04-26 DIAGNOSIS — I10 Essential (primary) hypertension: Secondary | ICD-10-CM | POA: Diagnosis not present

## 2021-04-26 DIAGNOSIS — E785 Hyperlipidemia, unspecified: Secondary | ICD-10-CM | POA: Diagnosis not present

## 2021-04-26 DIAGNOSIS — M1990 Unspecified osteoarthritis, unspecified site: Secondary | ICD-10-CM | POA: Diagnosis not present

## 2021-04-26 DIAGNOSIS — Z8739 Personal history of other diseases of the musculoskeletal system and connective tissue: Secondary | ICD-10-CM | POA: Diagnosis not present

## 2021-04-26 DIAGNOSIS — I7300 Raynaud's syndrome without gangrene: Secondary | ICD-10-CM | POA: Diagnosis not present

## 2021-04-26 DIAGNOSIS — Z79899 Other long term (current) drug therapy: Secondary | ICD-10-CM | POA: Diagnosis not present

## 2021-04-26 DIAGNOSIS — M359 Systemic involvement of connective tissue, unspecified: Secondary | ICD-10-CM | POA: Diagnosis not present

## 2021-04-26 DIAGNOSIS — M797 Fibromyalgia: Secondary | ICD-10-CM | POA: Diagnosis not present

## 2021-04-26 DIAGNOSIS — M069 Rheumatoid arthritis, unspecified: Secondary | ICD-10-CM | POA: Diagnosis not present

## 2021-04-26 DIAGNOSIS — M79643 Pain in unspecified hand: Secondary | ICD-10-CM | POA: Diagnosis not present

## 2021-04-28 DIAGNOSIS — Z794 Long term (current) use of insulin: Secondary | ICD-10-CM | POA: Diagnosis not present

## 2021-04-28 DIAGNOSIS — E1142 Type 2 diabetes mellitus with diabetic polyneuropathy: Secondary | ICD-10-CM

## 2021-04-28 DIAGNOSIS — I5032 Chronic diastolic (congestive) heart failure: Secondary | ICD-10-CM

## 2021-05-03 ENCOUNTER — Ambulatory Visit
Admission: RE | Admit: 2021-05-03 | Discharge: 2021-05-03 | Disposition: A | Discharge status: Home / Self Care | Payer: Medicare Other | Source: Ambulatory Visit | Attending: Neurology | Admitting: Neurology

## 2021-05-03 DIAGNOSIS — R519 Headache, unspecified: Secondary | ICD-10-CM

## 2021-05-03 DIAGNOSIS — M542 Cervicalgia: Secondary | ICD-10-CM

## 2021-05-03 DIAGNOSIS — G43709 Chronic migraine without aura, not intractable, without status migrainosus: Secondary | ICD-10-CM

## 2021-05-04 ENCOUNTER — Ambulatory Visit (INDEPENDENT_AMBULATORY_CARE_PROVIDER_SITE_OTHER): Payer: Medicare Other | Admitting: *Deleted

## 2021-05-04 DIAGNOSIS — Z794 Long term (current) use of insulin: Secondary | ICD-10-CM

## 2021-05-04 DIAGNOSIS — I5032 Chronic diastolic (congestive) heart failure: Secondary | ICD-10-CM

## 2021-05-04 NOTE — Patient Instructions (Addendum)
Visit Information ? ?Casy, thank you for taking time to talk with me today. Please don't hesitate to contact me if I can be of assistance to you before our next scheduled telephone appointment ? ?Below are the goals we discussed today:  ?Patient Self-Care Activities: ?Patient Samantha [name] will: ?Take medications as prescribed ?Attend all scheduled provider appointments ?Call pharmacy for medication refills ?Call provider office for new concerns or questions ?Continue to check fasting (first thing in the morning, before eating) blood sugars at home: the values we reviewed today are all in good range ?Continue monitoring your daily weights and blood pressures at home: the values we reviewed today are all in good range ?Continue to follow heart healthy, low salt, low cholesterol, carbohydrate-modified, low sugar diet ?Keep up the great work preventing falls at home- I am so glad you got and are using your new rollator walker ?If your hemorrhoids continue to bother you, please make an appointment with the GI doctor, as you are planning to do  ? ?Our next scheduled telephone follow up visit/ appointment is scheduled on: Thursday, May 18, 2021 at 3:00 pm- This is a PHONE CALL appointment ? ?If you need to cancel or re-schedule our visit, please call [phone] and our care guide team will be happy to assist you. ?  ?I look forward to hearing about your progress. ?  ?Oneta Rack, RN, BSN, CCRN Alumnus ?[place] ?([phone]: direct office ? ?If you are experiencing a Mental Health or [place] or need someone to talk to, please  ?call the Suicide and Crisis Lifeline: 988 ?call the [place] National Suicide Prevention Lifeline: 272-472-4102 or TTY: 416-023-3076 TTY 617 123 7912) to talk to a trained counselor ?call 1-800-273-TALK (toll free, 24 hour hotline) ?go to [facility] [address], [place]  [phone]) ?call 911  ? ?Patient verbalizes understanding of instructions and care plan provided today and agrees to view in [place]. Active MyChart status confirmed with patient   ? ?Occipital Neuralgia ?Occipital neuralgia is a type of headache that causes brief episodes of very bad pain in the back of the head. Pain from occipital neuralgia may spread (radiate) to other parts of the head. ?These headaches may be caused by irritation of the nerves that leave the spinal cord high up in the neck, just below the base of the skull (occipital nerves). The occipital nerves transmit sensations from the back of the head, the top of the head, and the areas behind the ears. ?What are the causes? ?This condition can occur without any known cause (primary headache syndrome). In other cases, this condition is caused by pressure on or irritation of one of the two occipital nerves. Pressure and irritation may be due to: ?Muscle spasm in the neck. ?Neck injury. ?Wear and tear of the vertebrae in the neck (osteoarthritis). ?Disease of the disks that separate the vertebrae. ?Swollen blood vessels that put pressure on the occipital nerves. ?Infections. ?Tumors. ?Diabetes. ?What are the signs or symptoms? ?This condition causes brief burning, stabbing, electric, shocking, or shooting pain in the back of the head that can radiate to the top of the head. It can happen on one side or both sides of the head. It can also cause: ?Pain behind the eye. ?Pain triggered by neck movement or hair brushing. ?Scalp tenderness. ?Aching in the back of the head between episodes of very bad pain. ?Pain that gets worse with exposure to bright lights. ?How  is this diagnosed? ?Your health care provider may diagnose the condition based on a physical exam and your symptoms. Tests may be done, such as: ?Imaging studies of the brain and neck (cervical spine), such as an MRI or CT scan. These look for causes of pinched nerves. ?Applying pressure to the  nerves in the neck to try to re-create the pain. ?Injection of numbing medicine into the occipital nerve areas to see if pain goes away (diagnostic nerve block). ?How is this treated? ?Treatment for this condition may begin with simple measures, such as: ?Rest. ?Massage. ?Applying heat or cold to the area. ?Over-the-counter pain relievers. ?If these measures do not work, you may need other treatments, including: ?Medicines, such as: ?Prescription-strength anti-inflammatory medicines. ?Muscle relaxants. ?Anti-seizure medicines, which can relieve pain. ?Antidepressants, which can relieve pain. ?Injected medicines, such as medicines that numb the area (local anesthetic) and steroids. ?Pulsed radiofrequency ablation. This is when wires are implanted to deliver electrical impulses that block pain signals from the occipital nerve. ?Surgery to relieve nerve pressure. ?Physical therapy. ?Follow these instructions at home: ?Managing pain ?  ?Avoid any activities that cause pain. ?Rest when you have an attack of pain. ?Try gentle massage to relieve pain. ?Try a different pillow or sleeping position. ?If directed, apply heat to the affected area as often as told by your health care provider. Use the heat source that your health care provider recommends, such as a moist heat pack or a heating pad. ?Place a towel between your skin and the heat source. ?Leave the heat on for 20-30 minutes. ?Remove the heat if your skin turns bright red. This is especially important if you are unable to feel pain, heat, or cold. You have a greater risk of getting burned. ?If directed, put ice on the back of your head and neck area. To do this: ?Put ice in a plastic bag. ?Place a towel between your skin and the bag. ?Leave the ice on for 20 minutes, 2-3 times a day. ?Remove the ice if your skin turns bright red. This is very important. If you cannot feel pain, heat, or cold, you have a greater risk of damage to the area. ?General instructions ?Take  over-the-counter and prescription medicines only as told by your health care provider. ?Avoid things that make your symptoms worse, such as bright lights. ?Try to stay active. Get regular exercise that does not cause pain. Ask your health care provider to suggest safe exercises for you. ?Work with a physical therapist to learn stretching exercises you can do at home. ?Practice good posture. ?Keep all follow-up visits. This is important. ?Contact a health care provider if: ?Your medicine is not working. ?You have new or worsening symptoms. ?Get help right away if: ?You have very bad head pain that does not go away. ?You have a sudden change in vision, balance, or speech. ?These symptoms may represent a serious problem that is an emergency. Do not wait to see if the symptoms will go away. Get medical help right away. Call your local emergency services (911 in the [place] Do not drive yourself to the hospital. ?Summary ?Occipital neuralgia is a type of headache that causes brief episodes of very bad pain in the back of the head. ?Pain from occipital neuralgia may spread (radiate) to other parts of the head. ?Treatment for this condition includes rest, massage, and medicines. ?This information is not intended to replace advice given to you by your health care provider. Make sure you discuss any questions  you have with your health care provider. ?Document Revised: 11/15/2019 Document Reviewed: 11/15/2019 ?Elsevier Patient Education ? [place]. ? ?

## 2021-05-04 NOTE — Chronic Care Management (AMB) (Signed)
?Chronic Care Management  ? ?CCM RN Visit Note ? ?05/04/2021 ?Name: Samantha [name] MRN: [id number] DOB: 1940-12-26 ? ?Subjective: ?Samantha [name] is a 81 y.o. year old female who is a primary care patient of Burns, Claudina Lick, MD. The care management team was consulted for assistance with disease management and care coordination needs.   ? ?Engaged with patient by telephone for follow up visit in response to provider referral for case management and/or care coordination services.  ? ?Consent to Services:  ?The patient was given information about Chronic Care Management services, agreed to services, and gave verbal consent prior to initiation of services.  Please see initial visit note for detailed documentation.  ?Patient agreed to services and verbal consent obtained.  ? ?Assessment: Review of patient past medical history, allergies, medications, health status, including review of consultants reports, laboratory and other test data, was performed as part of comprehensive evaluation and provision of chronic care management services.  ?CCM Care Plan ? ?Allergies  ?Allergen Reactions  ? Bactrim [Sulfamethoxazole-Trimethoprim] Other (See Comments)  ?  See OV 09-15-13, rash-tongue swelling due to bactrim ?  ? Cefuroxime Axetil Hives  ? Oxycodone Nausea And Vomiting  ? Pravastatin Other (See Comments)  ?  "muscle breakdown" with profuse sweating  ? Terfenadine Hives  ?  Other reaction(s): Other (See Comments)  ? Zocor [Simvastatin] Other (See Comments)  ?  2012 "Muscle breakdown " with profuse sweating  ? Tramadol Nausea And Vomiting  ? Atorvastatin Other (See Comments)  ?  Pt reports muscle aches and joint pains  ? Cefuroxime Other (See Comments)  ? Lactose Intolerance (Gi) Diarrhea  ? Latex   ?  blisters  ? Lime Flavor [Flavoring Agent] Diarrhea  ? Metformin And Related Diarrhea  ? Oxycodone Hcl Other (See Comments)  ? Pravastatin Sodium Other (See Comments)  ? Rosuvastatin Other (See Comments)  ?  Pt reports  "myalgias, fatigue, nosebleeds."  ? Rosuvastatin Calcium Other (See Comments)  ? Tape Other (See Comments)  ?  blisters  ? ?Outpatient Encounter Medications as of 05/04/2021  ?Medication Sig  ? acetaminophen (TYLENOL) 500 MG tablet Take 500 mg by mouth every 6 (six) hours as needed for mild pain.   ? albuterol (PROVENTIL) (2.5 MG/3ML) 0.083% nebulizer solution Take 3 mLs (2.5 mg total) by nebulization every 6 (six) hours as needed for wheezing or shortness of breath.  ? Calcium-Magnesium-Zinc (CAL-MAG-ZINC PO) Take 1 tablet by mouth daily.  ? cetirizine (ZYRTEC) 10 MG tablet TAKE 1 TABLET(10 MG) BY MOUTH DAILY  ? cholecalciferol (VITAMIN D) 1000 UNITS tablet Take 1,000 Units by mouth every morning.   ? clopidogrel (PLAVIX) 75 MG tablet TAKE 1 TABLET(75 MG) BY MOUTH DAILY  ? DULoxetine (CYMBALTA) 60 MG capsule Take 1 capsule (60 mg total) by mouth daily.  ? Erenumab-aooe (AIMOVIG) 70 MG/ML SOAJ Inject 70 mg into the skin every 30 (thirty) days.  ? Evolocumab (REPATHA SURECLICK) 951 MG/ML SOAJ INJECT 1 PEN UNDER THE SKIN EVERY 14 DAYS  ? famotidine (PEPCID) 40 MG tablet TAKE 1 TABLET(40 MG) BY MOUTH DAILY AS NEEDED FOR HEARTBURN OR INDIGESTION  ? FARXIGA 10 MG TABS tablet TAKE 1 TABLET(10 MG) BY MOUTH DAILY BEFORE BREAKFAST  ? glucose blood test strip Use as instructed  ? hydroxychloroquine (PLAQUENIL) 200 MG tablet Take 400 mg by mouth daily.   ? isosorbide mononitrate (IMDUR) 30 MG 24 hr tablet Take 3 tablets (90 mg total) by mouth daily.  ? Lancets (ONETOUCH ULTRASOFT) lancets Use as  instructed  ? losartan (COZAAR) 25 MG tablet TAKE 1 TABLET(25 MG) BY MOUTH DAILY  ? nebivolol (BYSTOLIC) 10 MG tablet TAKE 1 TABLET(10 MG) BY MOUTH DAILY  ? nitroGLYCERIN (NITROSTAT) 0.4 MG SL tablet DISSOLVE 1 TABLET UNDER THE TONGUE EVERY 5 MINUTES AS NEEDED FOR CHEST PAIN  ? Polyethyl Glycol-Propyl Glycol (SYSTANE) 0.4-0.3 % GEL ophthalmic gel Place 1 application into both eyes daily as needed (for dry eyes).   ? pregabalin (LYRICA)  75 MG capsule Take 1 capsule (75 mg total) by mouth 3 (three) times daily.  ? psyllium (METAMUCIL) 58.6 % powder Take 1 packet by mouth daily.  ? ranolazine (RANEXA) 500 MG 12 hr tablet Take 1 tablet (500 mg total) by mouth 2 (two) times daily.  ? Rimegepant Sulfate (NURTEC) 75 MG TBDP Take 1 tab at onset of migraine.  May repeat in 2 hrs, if needed.  Max dose: 2 tabs/day. This is a 30 day prescription.  ? SYMBICORT 80-4.5 MCG/ACT inhaler INHALE 2 PUFFS INTO THE LUNGS TWICE DAILY  ? torsemide (DEMADEX) 20 MG tablet Take one tablet by mouth every other day  ? VENTOLIN HFA 108 (90 Base) MCG/ACT inhaler INHALE 2 PUFFS INTO THE LUNGS EVERY 6 HOURS AS NEEDED FOR WHEEZING OR SHORTNESS OF BREATH  ? ?No facility-administered encounter medications on file as of 05/04/2021.  ? ?Patient Active Problem List  ? Diagnosis Date Noted  ? Neck pain 04/17/2021  ? Intractable headache 04/17/2021  ? 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 Urinary frequency 02/21/2021  ? Occipital neuralgia of left side 02/21/2021  ? CKD (chronic kidney disease) stage 3, GFR 30-59 ml/min (HCC) 02/20/2021  ? Spinal stenosis of cervical region 02/16/2021  ? Rheumatoid arthritis ([place]) - Dr Dossie Der 10/20/2020  ? Neuropathy 10/19/2020  ? Vitamin D deficiency 04/13/2020  ? S/P TKR (total knee replacement), 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  ? Aortic atherosclerosis ([place]) 11/03/2019  ? Posterior vitreous detachment of right eye 07/15/2019  ? Posterior vitreous detachment of left eye 07/15/2019  ? Diabetes mellitus without complication ([place]) 22/29/7989  ? Recurrent corneal erosion, 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  ? Raynaud phenomenon 05/06/2019  ? Trigger finger, left ring finger 03/18/2019  ? 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 01/28/2018  ? Sacral back pain 10/16/2017  ? Chronic left upper quadrant pain 07/23/2017  ? Poor balance 03/14/2017  ? Angina pectoris ([place]) 03/22/2016  ? Hypertensive heart disease  07/07/2015  ? Essential hypertension 07/07/2015  ? Lightheadedness 07/07/2015  ? Plantar fasciitis, left 03/22/2015  ? Coronary artery disease due to lipid rich plaque 01/05/2015  ? Chronic diastolic CHF (congestive heart failure), NYHA class 2 ([place]) 01/05/2015  ? 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 Migraine ([place]) 01/05/2014  ? VBI (vertebrobasilar insufficiency) 08/22/2012  ? TIA (transient ischemic attack) 06/27/2012  ? DJD (degenerative joint disease) 02/02/2011  ? Varicose veins of legs 06/06/2010  ? Palpitations 11/23/2008  ? UTI'S, RECURRENT 09/28/2008  ? Fibromyalgia 08/15/2007  ? DM II (diabetes mellitus, type II), w/ neuropathy 05/21/2006  ? Hyperlipidemia 05/21/2006  ? Asthma 01/29/2002  ? ?Conditions to be addressed/monitored:  CHF and DMII ? ?Care Plan : RN Care Manager Plan of Care  ?Updates made by Knox Royalty, RN since 05/04/2021 12:00 AM  ?  ? ?Problem: Chronic Disease Management Needs   ?Priority: High  ?  ? ?Long-Range Goal: Ongoing adherence to established plan of care for  long term chronic disease management   ?Start Date: 01/24/2021  ?Expected End Date: 01/24/2022  ?Priority: High  ?Note:   ?Current Barriers:  ?Chronic Disease Management support and education needs related to CHF and DMII ?Chronic pain- well managed with medications ?Caregiver for daughter, has cancer currently under control; patient's brother currently has cancer and patient is periodically his caregiver- brother lives in [place], [place]: 01/24/21- patient declines need for caregiver resources/ support ?05/04/21- reports her brother has recently passed away ? ?RNCM Clinical Goal(s):  ?Patient will demonstrate ongoing health management independence as evidenced by adherence to plan of care for CHF/ DMII        through collaboration with RN Care manager, provider, and care team.  ? ?Interventions: ?1:1 collaboration with primary care provider regarding development  and update of comprehensive plan of care as evidenced by provider attestation and co-signature ?Inter-disciplinary care team collaboration (see longitudinal plan of care) ?Evaluation of current treatment plan related

## 2021-05-12 ENCOUNTER — Encounter: Payer: Self-pay | Admitting: Neurology

## 2021-05-15 ENCOUNTER — Other Ambulatory Visit: Payer: Self-pay | Admitting: *Deleted

## 2021-05-15 ENCOUNTER — Telehealth: Payer: Self-pay | Admitting: *Deleted

## 2021-05-15 MED ORDER — AIMOVIG 70 MG/ML ~~LOC~~ SOAJ: 70.0000 mg | SUBCUTANEOUS | 11 refills | Status: DC

## 2021-05-15 MED ORDER — DULOXETINE HCL 30 MG PO CPEP: 30.0000 mg | ORAL_CAPSULE | Freq: Every day | ORAL | 3 refills | Status: DC

## 2021-05-15 NOTE — Telephone Encounter (Signed)
I called patient. I advised her that Dr. Krista Blue called in cymbalta '30mg'$  for her instead of the cymbalta '60mg'$ . I advised her to stop the cymbalta '60mg'$  and start the cymbalta '30mg'$ . Pt verbalized understanding. ? ?

## 2021-05-15 NOTE — Telephone Encounter (Signed)
Received the following mychart message from the patient: ?I did not get this prescription. Did you change the order?   ?In addition, the duloxetine is helping some but is making me nervous! What should I do now? ?_____________________________________ ?Per last note, she was prescribed Aimovig '70mg'$  and Nurtec '75mg'$  for treatment of migraine. She was prescribed duloxetine '60mg'$  daily for musculoskeletal pain. Dr. Krista Blue also felt it would help her anxiety (significant family stress). ?_____________________________________ ?I called the pharmacy. The patient has already picked up Nurtec. Per the the tech, the Aimovig '70mg'$  rx was closed out in error on their part. A new prescription was sent today.  ?______________________________________ ?I left the patient a message to return my call. We need to check to see if the duloxetine is helping her pain at all. If so, Dr. Krista Blue may offer a lower dose to try. If not, she may be able to suggest an alternative medication.  ?

## 2021-05-15 NOTE — Addendum Note (Signed)
Addended by: Lester [place] A on: 05/15/2021 11:16 AM ? ? Modules accepted: Orders ? ?

## 2021-05-15 NOTE — Telephone Encounter (Signed)
I spoke to the patient. Since starting duloxetine '60mg'$ , she has been experiences restlessness in her legs, tremors in hands. However, the medication has worked well for her musculoskeletal pain and anxiety.  ? ?She would like to try a lower dose of duloxetine, if Dr. Krista Blue feels it would be appropriate.  ?

## 2021-05-15 NOTE — Telephone Encounter (Signed)
Meds ordered this encounter  ?Medications  ? DULoxetine (CYMBALTA) 30 MG capsule  ?  Sig: Take 1 capsule (30 mg total) by mouth daily.  ?  Dispense:  90 capsule  ?  Refill:  3  ?   ?

## 2021-05-15 NOTE — Addendum Note (Signed)
Addended by: Marcial Pacas on: 05/15/2021 10:32 AM ? ? Modules accepted: Orders ? ?

## 2021-05-18 ENCOUNTER — Ambulatory Visit: Payer: Medicare Other | Admitting: *Deleted

## 2021-05-18 DIAGNOSIS — E1142 Type 2 diabetes mellitus with diabetic polyneuropathy: Secondary | ICD-10-CM

## 2021-05-18 DIAGNOSIS — I5032 Chronic diastolic (congestive) heart failure: Secondary | ICD-10-CM

## 2021-05-18 NOTE — Patient Instructions (Addendum)
Visit Information ? ?Glorene, thank you for taking time to talk with me today. Please don't hesitate to contact me if I can be of assistance to you before our next scheduled telephone appointment. ? ?Below are the goals we discussed today:  ?Patient Self-Care Activities: ?Patient Samantha [name] will: ?Take medications as prescribed ?Attend all scheduled provider appointments ?Call pharmacy for medication refills ?Call provider office for new concerns or questions ?Continue to check fasting (first thing in the morning, before eating) blood sugars at home: the values we reviewed today are all in good range ?Continue monitoring your daily weights and blood pressures at home: the values we reviewed today are all in good range ?Continue to follow heart healthy, low salt, low cholesterol, carbohydrate-modified, low sugar diet ?Keep up the great work preventing falls at home- I am so glad you got and are using your new rollator walker- continue to use as needed ? ?Our next scheduled telephone follow up visit/ appointment is scheduled on: Wednesday, August 02, 2021 at 3:00 pm- This is a PHONE CALL appointment ? ?If you need to cancel or re-schedule our visit, please call [phone] and our care guide team will be happy to assist you. ?  ?I look forward to hearing about your progress. ?  ?Oneta Rack, RN, BSN, CCRN Alumnus ?[place] ?[phone]: direct office ? ?If you are experiencing a Mental Health or [place] or need someone to talk to, please  ?call the Suicide and Crisis Lifeline: 988 ?call the [place] National Suicide Prevention Lifeline: 857-689-1903 or TTY: (279)346-2485 TTY (680)377-9082) to talk to a trained counselor ?call 1-800-273-TALK (toll free, 24 hour hotline) ?go to [facility] [address], [place] [phone]) ?call 911  ? ?Patient verbalizes understanding of instructions and care plan provided  today and agrees to receive mailed copy of AVS ? ?Health Maintenance After Age 60 ?After age 42, you are at a higher risk for certain long-term diseases and infections as well as injuries from falls. Falls are a major cause of broken bones and head injuries in people who are older than age 26. Getting regular preventive care can help to keep you healthy and well. Preventive care includes getting regular testing and making lifestyle changes as recommended by your health care provider. Talk with your health care provider about: ?Which screenings and tests you should have. A screening is a test that checks for a disease when you have no symptoms. ?A diet and exercise plan that is right for you. ?What should I know about screenings and tests to prevent falls? ?Screening and testing are the best ways to find a health problem early. Early diagnosis and treatment give you the best chance of managing medical conditions that are common after age 33. Certain conditions and lifestyle choices may make you more likely to have a fall. Your health care provider may recommend: ?Regular vision checks. Poor vision and conditions such as cataracts can make you more likely to have a fall. If you wear glasses, make sure to get your prescription updated if your vision changes. ?Medicine review. Work with your health care provider to regularly review all of the medicines you are taking, including over-the-counter medicines. Ask your health care provider about any side effects that may make you more likely to have a fall. Tell your health care provider if any medicines that you take make you feel dizzy or sleepy. ?Strength and balance checks. Your health care  provider may recommend certain tests to check your strength and balance while standing, walking, or changing positions. ?Foot health exam. Foot pain and numbness, as well as not wearing proper footwear, can make you more likely to have a fall. ?Screenings, including: ?Osteoporosis  screening. Osteoporosis is a condition that causes the bones to get weaker and break more easily. ?Blood pressure screening. Blood pressure changes and medicines to control blood pressure can make you feel dizzy. ?Depression screening. You may be more likely to have a fall if you have a fear of falling, feel depressed, or feel unable to do activities that you used to do. ?Alcohol use screening. Using too much alcohol can affect your balance and may make you more likely to have a fall. ?Follow these instructions at home: ?Lifestyle ?Do not drink alcohol if: ?Your health care provider tells you not to drink. ?If you drink alcohol: ?Limit how much you have to: ?0-1 drink a day for women. ?0-2 drinks a day for men. ?Know how much alcohol is in your drink. In the [place] one drink equals one 12 oz bottle of beer (355 mL), one 5 oz glass of wine (148 mL), or one 1? oz glass of hard liquor (44 mL). ?Do not use any products that contain nicotine or tobacco. These products include cigarettes, chewing tobacco, and vaping devices, such as e-cigarettes. If you need help quitting, ask your health care provider. ?Activity ? ?Follow a regular exercise program to stay fit. This will help you maintain your balance. Ask your health care provider what types of exercise are appropriate for you. ?If you need a cane or walker, use it as recommended by your health care provider. ?Wear supportive shoes that have nonskid soles. ?Safety ? ?Remove any tripping hazards, such as rugs, cords, and clutter. ?Install safety equipment such as grab bars in bathrooms and safety rails on stairs. ?Keep rooms and walkways well-lit. ?General instructions ?Talk with your health care provider about your risks for falling. Tell your health care provider if: ?You fall. Be sure to tell your health care provider about all falls, even ones that seem minor. ?You feel dizzy, tiredness (fatigue), or off-balance. ?Take over-the-counter and prescription medicines only  as told by your health care provider. These include supplements. ?Eat a healthy diet and maintain a healthy weight. A healthy diet includes low-fat dairy products, low-fat (lean) meats, and fiber from whole grains, beans, and lots of fruits and vegetables. ?Stay current with your vaccines. ?Schedule regular health, dental, and eye exams. ?Summary ?Having a healthy lifestyle and getting preventive care can help to protect your health and wellness after age 16. ?Screening and testing are the best way to find a health problem early and help you avoid having a fall. Early diagnosis and treatment give you the best chance for managing medical conditions that are more common for people who are older than age 28. ?Falls are a major cause of broken bones and head injuries in people who are older than age 51. Take precautions to prevent a fall at home. ?Work with your health care provider to learn what changes you can make to improve your health and wellness and to prevent falls. ?This information is not intended to replace advice given to you by your health care provider. Make sure you discuss any questions you have with your health care provider. ?Document Revised: 06/06/2020 Document Reviewed: 06/06/2020 ?Elsevier Patient Education ? [place]. ?  ? ?

## 2021-05-19 NOTE — Chronic Care Management (AMB) (Signed)
?Chronic Care Management  ? ?CCM RN Visit Note ? ?05/19/2021 ?Name: Samantha [name] MRN: [id number] DOB: Jun 28, 1940 ? ?Subjective: ?Samantha [name] is a 81 y.o. year old female who is a primary care patient of Burns, Claudina Lick, MD. The care management team was consulted for assistance with disease management and care coordination needs.   ? ?Engaged with patient by telephone for follow up visit in response to provider referral for case management and/or care coordination services.  ? ?Consent to Services:  ?The patient was given information about Chronic Care Management services, agreed to services, and gave verbal consent prior to initiation of services.  Please see initial visit note for detailed documentation.  ?Patient agreed to services and verbal consent obtained.  ? ?Assessment: Review of patient past medical history, allergies, medications, health status, including review of consultants reports, laboratory and other test data, was performed as part of comprehensive evaluation and provision of chronic care management services.  ? ?SDOH (Social Determinants of Health) assessments and interventions performed:  ?SDOH Interventions   ? ?Flowsheet Row Most Recent Value  ?SDOH Interventions   ?Transportation Interventions Intervention Not Indicated  [Patient reports she has resumed driving]  ? ?  ?CCM Care Plan ? ?Allergies  ?Allergen Reactions  ? Bactrim [Sulfamethoxazole-Trimethoprim] Other (See Comments)  ?  See OV 09-15-13, rash-tongue swelling due to bactrim ?  ? Cefuroxime Axetil Hives  ? Oxycodone Nausea And Vomiting  ? Pravastatin Other (See Comments)  ?  "muscle breakdown" with profuse sweating  ? Terfenadine Hives  ?  Other reaction(s): Other (See Comments)  ? Zocor [Simvastatin] Other (See Comments)  ?  2012 "Muscle breakdown " with profuse sweating  ? Tramadol Nausea And Vomiting  ? Atorvastatin Other (See Comments)  ?  Pt reports muscle aches and joint pains  ? Cefuroxime Other (See Comments)  ?  Lactose Intolerance (Gi) Diarrhea  ? Latex   ?  blisters  ? Lime Flavor [Flavoring Agent] Diarrhea  ? Metformin And Related Diarrhea  ? Oxycodone Hcl Other (See Comments)  ? Pravastatin Sodium Other (See Comments)  ? Rosuvastatin Other (See Comments)  ?  Pt reports "myalgias, fatigue, nosebleeds."  ? Rosuvastatin Calcium Other (See Comments)  ? Tape Other (See Comments)  ?  blisters  ? ?Outpatient Encounter Medications as of 05/18/2021  ?Medication Sig  ? acetaminophen (TYLENOL) 500 MG tablet Take 500 mg by mouth every 6 (six) hours as needed for mild pain.   ? albuterol (PROVENTIL) (2.5 MG/3ML) 0.083% nebulizer solution Take 3 mLs (2.5 mg total) by nebulization every 6 (six) hours as needed for wheezing or shortness of breath.  ? Calcium-Magnesium-Zinc (CAL-MAG-ZINC PO) Take 1 tablet by mouth daily.  ? cetirizine (ZYRTEC) 10 MG tablet TAKE 1 TABLET(10 MG) BY MOUTH DAILY  ? cholecalciferol (VITAMIN D) 1000 UNITS tablet Take 1,000 Units by mouth every morning.   ? clopidogrel (PLAVIX) 75 MG tablet TAKE 1 TABLET(75 MG) BY MOUTH DAILY  ? DULoxetine (CYMBALTA) 30 MG capsule Take 1 capsule (30 mg total) by mouth daily.  ? Erenumab-aooe (AIMOVIG) 70 MG/ML SOAJ Inject 70 mg into the skin every 30 (thirty) days.  ? Evolocumab (REPATHA SURECLICK) 456 MG/ML SOAJ INJECT 1 PEN UNDER THE SKIN EVERY 14 DAYS  ? famotidine (PEPCID) 40 MG tablet TAKE 1 TABLET(40 MG) BY MOUTH DAILY AS NEEDED FOR HEARTBURN OR INDIGESTION  ? FARXIGA 10 MG TABS tablet TAKE 1 TABLET(10 MG) BY MOUTH DAILY BEFORE BREAKFAST  ? glucose blood test strip Use as  instructed  ? hydroxychloroquine (PLAQUENIL) 200 MG tablet Take 400 mg by mouth daily.   ? isosorbide mononitrate (IMDUR) 30 MG 24 hr tablet Take 3 tablets (90 mg total) by mouth daily.  ? Lancets (ONETOUCH ULTRASOFT) lancets Use as instructed  ? losartan (COZAAR) 25 MG tablet TAKE 1 TABLET(25 MG) BY MOUTH DAILY  ? nebivolol (BYSTOLIC) 10 MG tablet TAKE 1 TABLET(10 MG) BY MOUTH DAILY  ? nitroGLYCERIN  (NITROSTAT) 0.4 MG SL tablet DISSOLVE 1 TABLET UNDER THE TONGUE EVERY 5 MINUTES AS NEEDED FOR CHEST PAIN  ? Polyethyl Glycol-Propyl Glycol (SYSTANE) 0.4-0.3 % GEL ophthalmic gel Place 1 application into both eyes daily as needed (for dry eyes).   ? pregabalin (LYRICA) 75 MG capsule Take 1 capsule (75 mg total) by mouth 3 (three) times daily.  ? psyllium (METAMUCIL) 58.6 % powder Take 1 packet by mouth daily.  ? ranolazine (RANEXA) 500 MG 12 hr tablet Take 1 tablet (500 mg total) by mouth 2 (two) times daily.  ? Rimegepant Sulfate (NURTEC) 75 MG TBDP Take 1 tab at onset of migraine.  May repeat in 2 hrs, if needed.  Max dose: 2 tabs/day. This is a 30 day prescription.  ? SYMBICORT 80-4.5 MCG/ACT inhaler INHALE 2 PUFFS INTO THE LUNGS TWICE DAILY  ? torsemide (DEMADEX) 20 MG tablet Take one tablet by mouth every other day  ? VENTOLIN HFA 108 (90 Base) MCG/ACT inhaler INHALE 2 PUFFS INTO THE LUNGS EVERY 6 HOURS AS NEEDED FOR WHEEZING OR SHORTNESS OF BREATH  ? ?No facility-administered encounter medications on file as of 05/18/2021.  ? ?Patient Active Problem List  ? Diagnosis Date Noted  ? Neck pain 04/17/2021  ? Intractable headache 04/17/2021  ? 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 Urinary frequency 02/21/2021  ? Occipital neuralgia of left side 02/21/2021  ? CKD (chronic kidney disease) stage 3, GFR 30-59 ml/min (HCC) 02/20/2021  ? Spinal stenosis of cervical region 02/16/2021  ? Rheumatoid arthritis ([place]) - Dr Dossie Der 10/20/2020  ? Neuropathy 10/19/2020  ? Vitamin D deficiency 04/13/2020  ? S/P TKR (total knee replacement), 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  ? Aortic atherosclerosis ([place]) 11/03/2019  ? Posterior vitreous detachment of right eye 07/15/2019  ? Posterior vitreous detachment of left eye 07/15/2019  ? Diabetes mellitus without complication ([place]) 69/62/9528  ? Recurrent corneal erosion, 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  ? Raynaud phenomenon 05/06/2019  ? Trigger finger, left ring finger 03/18/2019   ? 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 01/28/2018  ? Sacral back pain 10/16/2017  ? Chronic left upper quadrant pain 07/23/2017  ? Poor balance 03/14/2017  ? Angina pectoris ([place]) 03/22/2016  ? Hypertensive heart disease 07/07/2015  ? Essential hypertension 07/07/2015  ? Lightheadedness 07/07/2015  ? Plantar fasciitis, left 03/22/2015  ? Coronary artery disease due to lipid rich plaque 01/05/2015  ? Chronic diastolic CHF (congestive heart failure), NYHA class 2 ([place]) 01/05/2015  ? 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 Migraine ([place]) 01/05/2014  ? VBI (vertebrobasilar insufficiency) 08/22/2012  ? TIA (transient ischemic attack) 06/27/2012  ? DJD (degenerative joint disease) 02/02/2011  ? Varicose veins of legs 06/06/2010  ? Palpitations 11/23/2008  ? UTI'S, RECURRENT 09/28/2008  ? Fibromyalgia 08/15/2007  ? DM II (diabetes mellitus, type II), w/ neuropathy 05/21/2006  ? Hyperlipidemia 05/21/2006  ? Asthma 01/29/2002  ? ?Conditions to be addressed/monitored:  CHF and DMII ? ?Care Plan : RN Care Manager  Plan of Care  ?Updates made by Knox Royalty, RN since 05/19/2021 12:00 AM  ?  ? ?Problem: Chronic Disease Management Needs   ?Priority: High  ?  ? ?Long-Range Goal: Ongoing adherence to established plan of care for long term chronic disease management   ?Start Date: 01/24/2021  ?Expected End Date: 01/24/2022  ?Priority: High  ?Note:   ?Current Barriers:  ?Chronic Disease Management support and education needs related to CHF and DMII ?Chronic pain- well managed with medications ?Caregiver for daughter, has cancer currently under control; patient's brother currently has cancer and patient is periodically his caregiver- brother lives in [place], [place]: 01/24/21- patient declines need for caregiver resources/ support ?05/04/21- reports her brother has recently passed away ? ?RNCM Clinical Goal(s):  ?Patient will  demonstrate ongoing health management independence as evidenced by adherence to plan of care for CHF/ DMII        through collaboration with RN Care manager, provider, and care team.  ? ?Interventions: ?1:1 collabor

## 2021-05-28 DIAGNOSIS — Z794 Long term (current) use of insulin: Secondary | ICD-10-CM | POA: Diagnosis not present

## 2021-05-28 DIAGNOSIS — E1142 Type 2 diabetes mellitus with diabetic polyneuropathy: Secondary | ICD-10-CM

## 2021-05-28 DIAGNOSIS — I5032 Chronic diastolic (congestive) heart failure: Secondary | ICD-10-CM | POA: Diagnosis not present

## 2021-05-31 DIAGNOSIS — M797 Fibromyalgia: Secondary | ICD-10-CM | POA: Diagnosis not present

## 2021-05-31 DIAGNOSIS — M5412 Radiculopathy, cervical region: Secondary | ICD-10-CM | POA: Diagnosis not present

## 2021-05-31 DIAGNOSIS — M542 Cervicalgia: Secondary | ICD-10-CM | POA: Diagnosis not present

## 2021-05-31 DIAGNOSIS — G894 Chronic pain syndrome: Secondary | ICD-10-CM | POA: Diagnosis not present

## 2021-05-31 DIAGNOSIS — M5481 Occipital neuralgia: Secondary | ICD-10-CM | POA: Diagnosis not present

## 2021-06-01 ENCOUNTER — Telehealth: Payer: Self-pay

## 2021-06-01 NOTE — Telephone Encounter (Signed)
Left message with requesting provider; will fax to the requesting providers office as well.  ?

## 2021-06-01 NOTE — Telephone Encounter (Signed)
? ?  Pre-operative Risk Assessment  ?  ?Patient Name: Samantha [name]  ?DOB: [birth year] ?MRN: [id number]  ? ?  ? ?Request for Surgical Clearance   ? ?Procedure:   Cervical Spine Injection ? ?Date of Surgery:  Clearance TBD                              ?   ?Surgeon:  Dr Lenord Carbo ?Surgeon's Group or Practice Name:  [facility]  ?Phone number:  [phone] ext [phone] ?Fax number:  [phone] ?  ?Type of Clearance Requested:   ?- Pharmacy:  Hold Clopidogrel (Plavix) 7 days prior ?  ?Type of Anesthesia:   none listed ?  ?Additional requests/questions:   n/a ? ?Signed, ?Ulice Brilliant T   ?06/01/2021, 7:28 AM  ?

## 2021-06-01 NOTE — Telephone Encounter (Signed)
? ?  Patient Name: Samantha [name]  ?DOB: 1940-06-17 ?MRN: [id number] ? ?Primary Cardiologist: Freada Bergeron, MD ? ?Chart reviewed as part of pre-operative protocol coverage. We were asked to provide clearance for holding Plavix 7 days prior to her cervical spine injection.  However, she is taking Plavix for history of TIA and this is currently being managed by her PCP. Therefore our suggestion would be to defer recommendations to her PCP for clearance. Please reach out if you have any questions or concerns. ? ?Thank you, ? ? ?Mable Fill, Marissa Nestle, NP ?06/01/2021, 8:44 AM ?  ?

## 2021-06-12 ENCOUNTER — Other Ambulatory Visit: Payer: Self-pay | Admitting: *Deleted

## 2021-06-12 MED ORDER — NEBIVOLOL HCL 10 MG PO TABS: ORAL_TABLET | ORAL | 2 refills | Status: DC

## 2021-06-16 ENCOUNTER — Encounter: Payer: Self-pay | Admitting: Internal Medicine

## 2021-06-16 ENCOUNTER — Ambulatory Visit (INDEPENDENT_AMBULATORY_CARE_PROVIDER_SITE_OTHER): Payer: Medicare Other | Admitting: Internal Medicine

## 2021-06-16 ENCOUNTER — Ambulatory Visit (HOSPITAL_COMMUNITY)
Admission: RE | Admit: 2021-06-16 | Discharge: 2021-06-16 | Disposition: A | Discharge status: Home / Self Care | Payer: Medicare Other | Source: Ambulatory Visit | Attending: Internal Medicine | Admitting: Internal Medicine

## 2021-06-16 ENCOUNTER — Telehealth: Payer: Self-pay | Admitting: Internal Medicine

## 2021-06-16 VITALS — BP 140/72 | HR 68 | Temp 98.7°F | Ht 62.0 in | Wt 203.0 lb

## 2021-06-16 DIAGNOSIS — I10 Essential (primary) hypertension: Secondary | ICD-10-CM

## 2021-06-16 DIAGNOSIS — I2510 Atherosclerotic heart disease of native coronary artery without angina pectoris: Secondary | ICD-10-CM

## 2021-06-16 DIAGNOSIS — I2583 Coronary atherosclerosis due to lipid rich plaque: Secondary | ICD-10-CM

## 2021-06-16 DIAGNOSIS — R040 Epistaxis: Secondary | ICD-10-CM | POA: Diagnosis not present

## 2021-06-16 DIAGNOSIS — M79661 Pain in right lower leg: Secondary | ICD-10-CM | POA: Diagnosis not present

## 2021-06-16 NOTE — Patient Instructions (Addendum)
    For the nose bleeds ->  Use saline nasal spray in each nostril a few times a day  Get a humidifier for your bedroom or house  Use vaseline in the nostril at night to keep the mucus membrane moist  If the bleeding start you can use Afrin nasal spray and apply pressure as long as it takes to stop the bleeding.      A right leg ultrasound was ordered.

## 2021-06-16 NOTE — Assessment & Plan Note (Signed)
Chronic Blood pressure controlled Continue Imdur 90 mg daily, losartan 25 mg daily, Bystolic 10 mg daily

## 2021-06-16 NOTE — Progress Notes (Signed)
Subjective:    Samantha [name] ID: Samantha [name], female    DOB: [birth year], 81 y.o.   MRN: [id number]      HPI Samantha [name] is here for  Chief Complaint  Samantha [name] presents with   Leg Pain    Right leg pain (calf area feels like a charlie-horse)   Epistaxis    Samantha [name] reports nose bleed today that lasted for about 2 hours today.     Nosebleed:  Samantha [name] has had 2 nosebleeds.  The first one lasted almost 2 hrs from the left nostril.  The second bleed was two days later  and lasted about one hour - that occurred 5 days ago.   Samantha [name] has had 2 cauterizations in the past.     Pain in right leg:  it started 4 days ago and was hurting in the right calf when walking only-it does not hurt at rest.  It is an achy, pulling pain.  No n/t or leg swelling.  It does hurt when Samantha [name] flexes Samantha [name] ankle.  Samantha [name] denies any injury or new activities.    Medications and allergies reviewed with Samantha [name] and updated if appropriate.  Current Outpatient Medications on File Prior to Visit  Medication Sig Dispense Refill   acetaminophen (TYLENOL) 500 MG tablet Take 500 mg by mouth every 6 (six) hours as needed for mild pain.      albuterol (PROVENTIL) (2.5 MG/3ML) 0.083% nebulizer solution Take 3 mLs (2.5 mg total) by nebulization every 6 (six) hours as needed for wheezing or shortness of breath. 150 mL 0   Calcium-Magnesium-Zinc (CAL-MAG-ZINC PO) Take 1 tablet by mouth daily.     cetirizine (ZYRTEC) 10 MG tablet TAKE 1 TABLET(10 MG) BY MOUTH DAILY 90 tablet 3   cholecalciferol (VITAMIN D) 1000 UNITS tablet Take 1,000 Units by mouth every morning.      clopidogrel (PLAVIX) 75 MG tablet TAKE 1 TABLET(75 MG) BY MOUTH DAILY 90 tablet 1   DULoxetine (CYMBALTA) 30 MG capsule Take 1 capsule (30 mg total) by mouth daily. 90 capsule 3   Erenumab-aooe (AIMOVIG) 70 MG/ML SOAJ Inject 70 mg into the skin every 30 (thirty) days. 1 mL 11   Evolocumab (REPATHA SURECLICK) 878 MG/ML SOAJ INJECT 1 PEN UNDER THE SKIN EVERY 14 DAYS 6 mL 3    famotidine (PEPCID) 40 MG tablet TAKE 1 TABLET(40 MG) BY MOUTH DAILY AS NEEDED FOR HEARTBURN OR INDIGESTION 30 tablet 5   FARXIGA 10 MG TABS tablet TAKE 1 TABLET(10 MG) BY MOUTH DAILY BEFORE BREAKFAST 30 tablet 5   glucose blood test strip Use as instructed 100 each 12   hydroxychloroquine (PLAQUENIL) 200 MG tablet Take 400 mg by mouth daily.      isosorbide mononitrate (IMDUR) 30 MG 24 hr tablet Take 3 tablets (90 mg total) by mouth daily. 270 tablet 1   Lancets (ONETOUCH ULTRASOFT) lancets Use as instructed 100 each 12   losartan (COZAAR) 25 MG tablet TAKE 1 TABLET(25 MG) BY MOUTH DAILY 90 tablet 3   nebivolol (BYSTOLIC) 10 MG tablet TAKE 1 TABLET(10 MG) BY MOUTH DAILY 90 tablet 2   nitroGLYCERIN (NITROSTAT) 0.4 MG SL tablet DISSOLVE 1 TABLET UNDER THE TONGUE EVERY 5 MINUTES AS NEEDED FOR CHEST PAIN 25 tablet 7   Polyethyl Glycol-Propyl Glycol (SYSTANE) 0.4-0.3 % GEL ophthalmic gel Place 1 application into both eyes daily as needed (for dry eyes).      pregabalin (LYRICA) 75 MG capsule Take 1 capsule (75 mg total) by mouth 3 (three)  times daily. 90 capsule 5   psyllium (METAMUCIL) 58.6 % powder Take 1 packet by mouth daily.     ranolazine (RANEXA) 500 MG 12 hr tablet Take 1 tablet (500 mg total) by mouth 2 (two) times daily. 180 tablet 3   Rimegepant Sulfate (NURTEC) 75 MG TBDP Take 1 tab at onset of migraine.  May repeat in 2 hrs, if needed.  Max dose: 2 tabs/day. This is a 30 day prescription. 12 tablet 11   SYMBICORT 80-4.5 MCG/ACT inhaler INHALE 2 PUFFS INTO THE LUNGS TWICE DAILY 10.2 g 5   torsemide (DEMADEX) 20 MG tablet Take one tablet by mouth every other day 45 tablet 3   VENTOLIN HFA 108 (90 Base) MCG/ACT inhaler INHALE 2 PUFFS INTO THE LUNGS EVERY 6 HOURS AS NEEDED FOR WHEEZING OR SHORTNESS OF BREATH 18 g 11   No current facility-administered medications on file prior to visit.    Review of Systems  Constitutional:  Negative for fever.  HENT:  Positive for nosebleeds, postnasal  drip, sore throat and tinnitus.   Cardiovascular:  Negative for leg swelling.  Musculoskeletal:  Positive for neck pain.  Neurological:  Positive for headaches. Negative for numbness.      Objective:   Vitals:   06/16/21 1328  BP: 140/72  Pulse: 68  Temp: 98.7 F (37.1 C)  SpO2: 98%   BP Readings from Last 3 Encounters:  06/16/21 140/72  04/20/21 132/62  04/17/21 (!) 157/78   Wt Readings from Last 3 Encounters:  06/16/21 203 lb (92.1 kg)  04/25/21 205 lb (93 kg)  04/20/21 205 lb (93 kg)   Body mass index is 37.13 kg/m.    Physical Exam Constitutional:      General: Samantha [name] is not in acute distress.    Appearance: Normal appearance.  HENT:     Head: Normocephalic and atraumatic.     Right Ear: Tympanic membrane, ear canal and external ear normal. There is no impacted cerumen.     Left Ear: Tympanic membrane, ear canal and external ear normal. There is no impacted cerumen.     Nose:     Comments: No active bleeding Musculoskeletal:        General: Tenderness (minimal if right calf) present.     Cervical back: No tenderness.     Right lower leg: No edema.     Left lower leg: No edema.  Lymphadenopathy:     Cervical: No cervical adenopathy.  Skin:    General: Skin is warm and dry.  Neurological:     Mental Status: Samantha [name] is alert.       VAS [place] LOWER EXTREMITY VENOUS (DVT)  Lower Venous DVT Study  Samantha [name] Name:  Samantha [name] [facility]  Date of Exam:   06/16/2021 Medical Rec #: [id number]           Accession #:    [id number] Date of Birth: 1940/06/12           Samantha [name] Gender: F Samantha [name] Age:   71 years Exam Location:  [facility] Procedure:      VAS [place] LOWER EXTREMITY VENOUS (DVT) Referring Phys: Marzetta Board Donnelle Rubey  --------------------------------------------------------------------------------   Indications: Pain, and Swelling. Other Indications: Right calf claudication.  Performing Technologist: Delorise Shiner RVT    Examination Guidelines: A  complete evaluation includes B-mode imaging, spectral Doppler, color Doppler, and power Doppler as needed of all accessible portions of each vessel. Bilateral testing is considered an integral part of a complete examination. Limited examinations  for reoccurring indications may be performed as noted. The reflux portion of the exam is performed with the Samantha [name] in reverse Trendelenburg.     +---------+---------------+---------+-----------+----------+--------------+ RIGHT    CompressibilityPhasicitySpontaneityPropertiesThrombus Aging +---------+---------------+---------+-----------+----------+--------------+ CFV      Full           Yes      Yes                                 +---------+---------------+---------+-----------+----------+--------------+ SFJ      Full           Yes      Yes                                 +---------+---------------+---------+-----------+----------+--------------+ FV Prox  Full           Yes      Yes                                 +---------+---------------+---------+-----------+----------+--------------+ FV Mid   Full           Yes      Yes                                 +---------+---------------+---------+-----------+----------+--------------+ FV DistalFull           Yes      Yes                                 +---------+---------------+---------+-----------+----------+--------------+ PFV      Full                    Yes                                 +---------+---------------+---------+-----------+----------+--------------+ POP      Full           Yes      Yes                                 +---------+---------------+---------+-----------+----------+--------------+ PTV      Full                                                        +---------+---------------+---------+-----------+----------+--------------+ PERO     Full                                                         +---------+---------------+---------+-----------+----------+--------------+ GSV      Full                    Yes                                 +---------+---------------+---------+-----------+----------+--------------+  SSV      Full                                                        +---------+---------------+---------+-----------+----------+--------------+        +----+---------------+---------+-----------+----------+--------------+ LEFTCompressibilityPhasicitySpontaneityPropertiesThrombus Aging +----+---------------+---------+-----------+----------+--------------+ CFV Full           Yes      Yes                                 +----+---------------+---------+-----------+----------+--------------+        Findings reported to [facility] at 16:20.   Summary: RIGHT: - There is no evidence of deep vein thrombosis in the lower extremity. - There is no evidence of superficial venous thrombosis.   - No cystic structure found in the popliteal fossa. - Limited imaging of the SFA did not show any significant stenosis.   LEFT: - No evidence of common femoral vein obstruction.   *See table(s) above for measurements and observations.       Preliminary        Assessment & Plan:    See Problem List for Assessment and Plan of chronic medical problems.

## 2021-06-16 NOTE — Assessment & Plan Note (Signed)
Acute Started 4 days ago No injury or new activities Need to rule out blood clot-ultrasound ordered and did come back negative Likely muscle strain versus tendon injury Would recommend further evaluation by Ortho or sports medicine Symptomatic treatment

## 2021-06-16 NOTE — Telephone Encounter (Signed)
Please let her know her ultrasound was negative for a blood clot.  She should either see Dr. Lorin Mercy for further evaluation or if she wants to see sports medicine downstairs I can refer her-just let me know.

## 2021-06-16 NOTE — Assessment & Plan Note (Signed)
Acute Has had 2 nosebleeds recently-both from the left nostril She is on a blood thinner and the one nosebleed took 2 hours to stop bleeding with pressure History of nosebleed in the past that required cauterization No bleeding in the past couple of days She did put Vaseline in her nostrils and advised to continue doing that at night Saline nasal spray, humidifier in the bedroom at night advised Can have Afrin at home in case she starts bleeding again, which may help stop the bleeding Advised how to apply pressure and lean head forward Call if nosebleeds recur

## 2021-06-16 NOTE — Telephone Encounter (Signed)
Message left for patient and my-chart message sent.

## 2021-06-21 ENCOUNTER — Telehealth: Payer: Self-pay

## 2021-06-21 ENCOUNTER — Telehealth: Payer: Self-pay | Admitting: Internal Medicine

## 2021-06-21 NOTE — Telephone Encounter (Signed)
Yes, ok to stop for 5 days prior

## 2021-06-21 NOTE — Telephone Encounter (Signed)
Patient would like to know if she needs an antibiotic before having epidural injection?  Cb# [id number].  Please advise.  Thank you.

## 2021-06-21 NOTE — Telephone Encounter (Signed)
Pt called into the office and states she is scheduled to have an epidural on 5.30.23.   Wats to know if she should stop taking clopidogrel (PLAVIX) 75 MG tablet for five days prior to appt.   Requesting a call back from assistant ASAP.

## 2021-06-21 NOTE — Telephone Encounter (Signed)
Spoke with patient today and info given. 

## 2021-06-22 NOTE — Telephone Encounter (Signed)
noted 

## 2021-06-22 NOTE — Telephone Encounter (Signed)
Please advise 

## 2021-06-27 DIAGNOSIS — M5412 Radiculopathy, cervical region: Secondary | ICD-10-CM | POA: Diagnosis not present

## 2021-07-10 ENCOUNTER — Other Ambulatory Visit: Payer: Self-pay | Admitting: Internal Medicine

## 2021-07-12 ENCOUNTER — Ambulatory Visit (HOSPITAL_COMMUNITY)
Admission: EM | Admit: 2021-07-12 | Discharge: 2021-07-12 | Disposition: A | Discharge status: Home / Self Care | Payer: Medicare Other | Attending: Family Medicine | Admitting: Family Medicine

## 2021-07-12 ENCOUNTER — Encounter (HOSPITAL_COMMUNITY): Payer: Self-pay | Admitting: Emergency Medicine

## 2021-07-12 ENCOUNTER — Ambulatory Visit: Payer: Medicare Other | Admitting: Orthopaedic Surgery

## 2021-07-12 ENCOUNTER — Other Ambulatory Visit: Payer: Self-pay

## 2021-07-12 ENCOUNTER — Telehealth: Payer: Self-pay

## 2021-07-12 DIAGNOSIS — R040 Epistaxis: Secondary | ICD-10-CM

## 2021-07-12 MED ORDER — OXYMETAZOLINE HCL 0.05 % NA SOLN
NASAL | Status: AC
  Filled 2021-07-12: qty 30

## 2021-07-12 MED ORDER — OXYMETAZOLINE HCL 0.05 % NA SOLN
2.0000 | Freq: Once | NASAL | Status: AC
  Administered 2021-07-12: 2 via NASAL

## 2021-07-12 NOTE — ED Triage Notes (Signed)
Nose bleeds started about a month ago.  Patient saw pcp.  Today, patient has had 3 nosebleeds.  Left nares is painful, and now head has started hurting since 3rd episode ended.  Had a brief surge of pain in left neck this morning, but left  Patient has had a vein in left nares cauterized by ent 3-4 years ago (dr Lucia Gaskins)

## 2021-07-12 NOTE — Discharge Instructions (Addendum)
I feel it would be wise to schedule follow up with your ENT provider.

## 2021-07-12 NOTE — Telephone Encounter (Signed)
Patient called in regards to epistaxis with large amounts of drainage. Patient is also experiencing symptoms of lightheadedness, intermittent shoulder pain, and fatigue. Patient reports hx of epistaxis that resulted in a nasal vessel needing to be cauterized by ENT about 4 years ago. Patient also reports having had a pain management procedure (epidural) about 1-2 weeks ago prior to the nosebleeds. This nurse advised patient to seek medical attention. Sister just arrived and will transport patient to ED for further evaluation.

## 2021-07-15 NOTE — ED Provider Notes (Signed)
[facility]   [id number] 07/12/21 Arrival Time: 5916  ASSESSMENT & PLAN:  1. Left-sided epistaxis    Bleeding controlled with AFRIN along with cautery using silver nitrate. No complications.  She has ENT with whom she can schedule f/u.   Follow-up Information     [facility].   Specialty: Emergency Medicine Why: If symptoms worsen in any way. Contact information: [address] [id number] [place] [phone]                Reviewed expectations re: course of current medical issues. Questions answered. Outlined signs and symptoms indicating need for more acute intervention. Understanding verbalized. After Visit Summary given.   SUBJECTIVE: History from: Patient. Samantha [name] is a 81 y.o. female. Reports: L-sided epistaxis. On/off over a few weeks; x 3 today; stops after holding pressure for at least 30 min. No trauma. Denies: headache. Normal PO intake without n/v/d.  OBJECTIVE:  Vitals:   07/12/21 1829  BP: (!) 125/59  Pulse: 64  Resp: 20  Temp: 98.4 F (36.9 C)  TempSrc: Oral  SpO2: 97%    General appearance: alert; no distress Eyes: PERRLA; EOMI; conjunctiva normal HENT: [place]; AT; without nasal congestion; dried blood on L; site of bleeding located at [facility] plexus Neck: supple  Lungs: speaks full sentences without difficulty; unlabored Extremities: no edema Skin: warm and dry Neurologic: normal gait Psychological: alert and cooperative; normal mood and affect   Allergies  Allergen Reactions   Bactrim [Sulfamethoxazole-Trimethoprim] Other (See Comments)    See OV 09-15-13, rash-tongue swelling due to bactrim ?   Cefuroxime Axetil Hives   Oxycodone Nausea And Vomiting   Pravastatin Other (See Comments)    "muscle breakdown" with profuse sweating   Terfenadine Hives    Other reaction(s): Other (See Comments)   Zocor [Simvastatin] Other (See  Comments)    2012 "Muscle breakdown " with profuse sweating   Tramadol Nausea And Vomiting   Atorvastatin Other (See Comments)    Pt reports muscle aches and joint pains   Cefuroxime Other (See Comments)   Lactose Intolerance (Gi) Diarrhea   Latex     blisters   Lime Flavor [Flavoring Agent] Diarrhea   Metformin And Related Diarrhea   Oxycodone Hcl Other (See Comments)   Pravastatin Sodium Other (See Comments)   Rosuvastatin Other (See Comments)    Pt reports "myalgias, fatigue, nosebleeds."   Rosuvastatin Calcium Other (See Comments)   Tape Other (See Comments)    blisters    Past Medical History:  Diagnosis Date   A-fib ([place])    Abnormal CT of the chest 2008   last CT4-l 2009:  . No f/u suggested    Allergy    Asthma    CAD (coronary artery disease)    a. Coronary CTA 10/16: Coronary Ca score 211, mod non-obstructive CAD with LM mild plaque (25-50%), mid LAD 50-69%. b. Neg nuc 06/2015.   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   Minimally invasive right hemicolectomy to be done    Neuromuscular disorder (  [place])    FIBROMYALGIA   Ocular migraine    OSA (obstructive sleep apnea) 09/2007   dx w/ a sleep study, not on  CPAP   Osteoarthritis    Osteoporosis    Pneumonia    "double" in 2004   PONV (postoperative nausea and vomiting)    Reactive airway disease 01/29/2002   dx of pseudoasthma / vcd in 2005 and nl sprirometry History of dyspnea, 2011,  improved after several medications were changed around Question of COPD, disproved July 06, 2009 with nl pft's       Rheumatoid factor positive    Shingles 11/2009   Sleep apnea     Stroke ([place])    TIA (transient ischemic attack)    x2 - on Plavix for this   Torn rotator cuff    right worse than left, both are torn   Tumor, 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   Type II diabetes mellitus ([place])    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    Social History   Socioeconomic History   Marital status: Married    Spouse name: Ilona Sorrel   Number of children: 2   Years of education: masters   Highest education level: Not on file  Occupational History   Occupation: Retired, disable since 2000    Employer: RETIRED  Tobacco Use   Smoking status: Former    Packs/day: 0.25    Years: 5.00    Total pack years: 1.25    Types: Cigarettes    Quit date: 01/29/1998    Years since quitting: 23.4   Smokeless tobacco: Never   Tobacco comments:    Quit in 2001  Vaping Use   Vaping Use: Never used  Substance and Sexual Activity   Alcohol use: No    Alcohol/week: 0.0 standard drinks of alcohol   Drug use: No   Sexual activity: Never  Other Topics Concern   Not on file  Social History Narrative   On disability since 2000--- also husband has MS   Education. College.   Right handed.   Social Determinants of Health   Financial Resource Strain: Low Risk  (12/16/2020)   Overall Financial Resource Strain (CARDIA)    Difficulty of Paying Living Expenses: Not hard at all  Food Insecurity: No Food Insecurity (03/29/2021)   Hunger Vital Sign    Worried About Running Out of Food in the Last Year: Never true    Ran Out of Food in the Last Year: Never true  Transportation Needs: No Transportation Needs (05/18/2021)   PRAPARE - Hydrologist (Medical): No    Lack of Transportation (Non-Medical): No  Physical Activity: Insufficiently Active (12/16/2020)   Exercise Vital Sign    Days of Exercise per Week: 4 days    Minutes of Exercise per Session: 30 min  Stress: No Stress Concern Present (12/16/2020)   [place]    Feeling of Stress : Not at all  Social Connections: Moderately Isolated (12/16/2020)   Social Connection and Isolation Panel [NHANES]    Frequency of Communication with Friends and Family: Twice a week    Frequency of Social Gatherings with Friends and Family: Twice a week    Attends Religious Services: More than 4 times per year    Active Member of [employer] or Organizations: No    Attends Archivist Meetings: Never    Marital Status:  Widowed  Intimate Partner Violence: Not At Risk (12/16/2020)   Humiliation, Afraid, Rape, and Kick questionnaire    Fear of Current or Ex-Partner: No    Emotionally Abused: No    Physically Abused: No    Sexually Abused: No   Family History  Problem Relation Age of Onset   Heart disease Father    Heart disease Mother    Lung cancer Mother    Allergies Sister    Parkinsonism Sister        possible   Asthma Sister    Asthma Paternal Grandmother    Stroke Paternal Grandmother    Heart disease Other        paternal grandparents, maternal grandparents,    Heart disease Brother    Emphysema Brother    Aneurysm Brother        x3   Kidney failure Brother    Diabetes Brother    Diabetes Brother    Stroke Brother    Stroke Maternal Grandmother    Breast cancer Neg Hx    Colon cancer Neg Hx    Heart attack Neg Hx    Esophageal cancer Neg Hx    Rectal cancer Neg Hx    Stomach cancer Neg Hx    Past Surgical History:  Procedure Laterality Date   ABDOMINAL HYSTERECTOMY  1980   NO oophorectomy per pt    ANTERIOR CERVICAL DECOMP/DISCECTOMY FUSION  2001   C 3, C4 and C5 plate and screws   BREAST BIOPSY Right 1999   BUNIONECTOMY Left ~ [place]     2018 By Dr. Pernell Dupre (done after colon surgery)   CATARACT EXTRACTION W/ INTRAOCULAR LENS  IMPLANT, BILATERAL  2012   COLON SURGERY  10/2014   EYE SURGERY Bilateral    torq lens for cataracts   LEFT HEART CATH AND CORONARY  ANGIOGRAPHY N/A 03/22/2016   Procedure: Left Heart Cath and Coronary Angiography;  Surgeon: Belva Crome, MD;  Location: [facility];  Service: Cardiovascular;  Laterality: N/A;   LUMBAR LAMINECTOMY/DECOMPRESSION MICRODISCECTOMY N/A 01/20/2018   Procedure: L4-5 decompression;  Surgeon: Marybelle Killings, MD;  Location: [facility];  Service: Orthopedics;  Laterality: N/A;   THYROIDECTOMY, PARTIAL  1960's   TOTAL KNEE ARTHROPLASTY Right 12/04/2019   Procedure: RIGHT TOTAL KNEE ARTHROPLASTY;  Surgeon: Marybelle Killings, MD;  Location: [facility];  Service: Orthopedics;  Laterality: Right;  RNFA [place]   "Laser surgery for vaginal cancer; followed by chemotherapy" (06/27/2012)     Vanessa Kick, MD 07/15/21 1552

## 2021-07-17 ENCOUNTER — Ambulatory Visit (INDEPENDENT_AMBULATORY_CARE_PROVIDER_SITE_OTHER): Payer: Medicare Other | Admitting: Ophthalmology

## 2021-07-17 ENCOUNTER — Encounter (INDEPENDENT_AMBULATORY_CARE_PROVIDER_SITE_OTHER): Payer: Self-pay | Admitting: Ophthalmology

## 2021-07-17 DIAGNOSIS — H43812 Vitreous degeneration, left eye: Secondary | ICD-10-CM

## 2021-07-17 DIAGNOSIS — H43811 Vitreous degeneration, right eye: Secondary | ICD-10-CM

## 2021-07-17 DIAGNOSIS — E119 Type 2 diabetes mellitus without complications: Secondary | ICD-10-CM | POA: Diagnosis not present

## 2021-07-17 NOTE — Assessment & Plan Note (Signed)
No DR today 

## 2021-07-17 NOTE — Assessment & Plan Note (Signed)
OD physiologic and stable 

## 2021-07-17 NOTE — Progress Notes (Signed)
07/17/2021     CHIEF COMPLAINT Patient presents for  Chief Complaint  Patient presents with   Retina Follow Up      HISTORY OF PRESENT ILLNESS: Samantha [name] is a 81 y.o. female who presents to the clinic today for:   HPI     Retina Follow Up           Diagnosis: Other   Laterality: right eye   Severity: moderate   Course: stable         Comments   1 YR FU OU OCT. Pt vision has been stable. Pt reports vision has been the best its ever been. Pt denies floaters and FOL.       Last edited by Silvestre Moment on 07/17/2021 10:59 AM.      Referring physician: [name], [place],  [place] [id number]  HISTORICAL INFORMATION:   Selected notes from the MEDICAL RECORD NUMBER    Lab Results  Component Value Date   HGBA1C 6.6 (H) 02/21/2021     CURRENT MEDICATIONS: Current Outpatient Medications (Ophthalmic Drugs)  Medication Sig   Polyethyl Glycol-Propyl Glycol (SYSTANE) 0.4-0.3 % GEL ophthalmic gel Place 1 application into both eyes daily as needed (for dry eyes).    No current facility-administered medications for this visit. (Ophthalmic Drugs)   Current Outpatient Medications (Other)  Medication Sig   acetaminophen (TYLENOL) 500 MG tablet Take 500 mg by mouth every 6 (six) hours as needed for mild pain.    albuterol (PROVENTIL) (2.5 MG/3ML) 0.083% nebulizer solution Take 3 mLs (2.5 mg total) by nebulization every 6 (six) hours as needed for wheezing or shortness of breath.   Calcium-Magnesium-Zinc (CAL-MAG-ZINC PO) Take 1 tablet by mouth daily.   cetirizine (ZYRTEC) 10 MG tablet TAKE 1 TABLET(10 MG) BY MOUTH DAILY   cholecalciferol (VITAMIN D) 1000 UNITS tablet Take 1,000 Units by mouth every morning.    clopidogrel (PLAVIX) 75 MG tablet TAKE 1 TABLET(75 MG) BY MOUTH DAILY   DULoxetine (CYMBALTA) 30 MG capsule Take 1 capsule (30 mg total) by mouth daily.   Erenumab-aooe (AIMOVIG) 70 MG/ML SOAJ Inject 70 mg into the skin every 30  (thirty) days.   Evolocumab (REPATHA SURECLICK) 191 MG/ML SOAJ INJECT 1 PEN UNDER THE SKIN EVERY 14 DAYS   famotidine (PEPCID) 40 MG tablet TAKE 1 TABLET(40 MG) BY MOUTH DAILY AS NEEDED FOR HEARTBURN OR INDIGESTION   FARXIGA 10 MG TABS tablet TAKE 1 TABLET(10 MG) BY MOUTH DAILY BEFORE BREAKFAST   glucose blood test strip Use as instructed   hydroxychloroquine (PLAQUENIL) 200 MG tablet Take 400 mg by mouth daily.    isosorbide mononitrate (IMDUR) 30 MG 24 hr tablet Take 3 tablets (90 mg total) by mouth daily.   Lancets (ONETOUCH ULTRASOFT) lancets Use as instructed   losartan (COZAAR) 25 MG tablet TAKE 1 TABLET(25 MG) BY MOUTH DAILY   nebivolol (BYSTOLIC) 10 MG tablet TAKE 1 TABLET(10 MG) BY MOUTH DAILY   nitroGLYCERIN (NITROSTAT) 0.4 MG SL tablet DISSOLVE 1 TABLET UNDER THE TONGUE EVERY 5 MINUTES AS NEEDED FOR CHEST PAIN   pregabalin (LYRICA) 75 MG capsule Take 1 capsule (75 mg total) by mouth 3 (three) times daily.   psyllium (METAMUCIL) 58.6 % powder Take 1 packet by mouth daily.   ranolazine (RANEXA) 500 MG 12 hr tablet Take 1 tablet (500 mg total) by mouth 2 (two) times daily.   Rimegepant Sulfate (NURTEC) 75 MG TBDP Take 1 tab at onset of migraine.  May repeat in 2 hrs, if needed.  Max dose: 2 tabs/day. This is a 30 day prescription.   SYMBICORT 80-4.5 MCG/ACT inhaler INHALE 2 PUFFS INTO THE LUNGS TWICE DAILY   torsemide (DEMADEX) 20 MG tablet Take one tablet by mouth every other day   VENTOLIN HFA 108 (90 Base) MCG/ACT inhaler INHALE 2 PUFFS INTO THE LUNGS EVERY 6 HOURS AS NEEDED FOR WHEEZING OR SHORTNESS OF BREATH   No current facility-administered medications for this visit. (Other)      REVIEW OF SYSTEMS: ROS   Negative for: Constitutional, Gastrointestinal, Neurological, Skin, Genitourinary, Musculoskeletal, HENT, Endocrine, Cardiovascular, Eyes, Respiratory, Psychiatric, Allergic/Imm, Heme/Lymph Last edited by Silvestre Moment on 07/17/2021 10:59 AM.       ALLERGIES Allergies   Allergen Reactions   Bactrim [Sulfamethoxazole-Trimethoprim] Other (See Comments)    See OV 09-15-13, rash-tongue swelling due to bactrim ?   Cefuroxime Axetil Hives   Oxycodone Nausea And Vomiting   Pravastatin Other (See Comments)    "muscle breakdown" with profuse sweating   Terfenadine Hives    Other reaction(s): Other (See Comments)   Zocor [Simvastatin] Other (See Comments)    2012 "Muscle breakdown " with profuse sweating   Tramadol Nausea And Vomiting   Atorvastatin Other (See Comments)    Pt reports muscle aches and joint pains   Cefuroxime Other (See Comments)   Lactose Intolerance (Gi) Diarrhea   Latex     blisters   Lime Flavor [Flavoring Agent] Diarrhea   Metformin And Related Diarrhea   Oxycodone Hcl Other (See Comments)   Pravastatin Sodium Other (See Comments)   Rosuvastatin Other (See Comments)    Pt reports "myalgias, fatigue, nosebleeds."   Rosuvastatin Calcium Other (See Comments)   Tape Other (See Comments)    blisters    PAST MEDICAL HISTORY Past Medical History:  Diagnosis Date   A-fib ([place])    Abnormal CT of the chest 2008   last CT4-l 2009:  . No f/u suggested    Allergy    Asthma    CAD (coronary artery disease)    a. Coronary CTA 10/16: Coronary Ca score 211, mod non-obstructive CAD with LM mild plaque (25-50%), mid LAD 50-69%. b. Neg nuc 06/2015.   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   Minimally invasive right hemicolectomy to be done    Neuromuscular disorder ([place])    FIBROMYALGIA   Ocular migraine    OSA (obstructive sleep apnea) 09/2007   dx w/ a sleep study, not on  CPAP   Osteoarthritis     Osteoporosis    Pneumonia    "double" in 2004   PONV (postoperative nausea and vomiting)    Reactive airway disease 01/29/2002   dx of pseudoasthma / vcd in 2005 and nl sprirometry History of dyspnea, 2011,  improved after several medications were changed around Question of COPD, disproved July 06, 2009 with nl pft's       Rheumatoid factor positive    Shingles 11/2009   Sleep apnea    Stroke [facility])    TIA (transient ischemic attack)  x2 - on Plavix for this   Torn rotator cuff    right worse than left, both are torn   Tumor, 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 Pernell Dupre (done after colon surgery)   CATARACT EXTRACTION W/ INTRAOCULAR LENS  IMPLANT, BILATERAL  2012   COLON SURGERY  10/2014   EYE SURGERY Bilateral    torq lens for cataracts   LEFT HEART CATH AND CORONARY ANGIOGRAPHY N/A 03/22/2016   Procedure: Left Heart Cath and Coronary Angiography;  Surgeon: Belva Crome, MD;  Location: [facility];  Service: Cardiovascular;  Laterality: N/A;   LUMBAR LAMINECTOMY/DECOMPRESSION MICRODISCECTOMY N/A 01/20/2018   Procedure: L4-5 decompression;  Surgeon: Marybelle Killings, MD;  Location: [facility];  Service: Orthopedics;  Laterality: N/A;   THYROIDECTOMY, PARTIAL  1960's   TOTAL KNEE ARTHROPLASTY Right 12/04/2019   Procedure: RIGHT TOTAL KNEE ARTHROPLASTY;  Surgeon: Marybelle Killings, MD;  Location: [facility];  Service: Orthopedics;  Laterality: Right;  RNFA APRIL [place]   "Laser surgery for vaginal cancer; followed by chemotherapy" (06/27/2012)    FAMILY HISTORY Family History   Problem Relation Age of Onset   Heart disease Father    Heart disease Mother    Lung cancer Mother    Allergies Sister    Parkinsonism Sister        possible   Asthma Sister    Asthma Paternal Grandmother    Stroke Paternal Grandmother    Heart disease Other        paternal grandparents, maternal grandparents,    Heart disease Brother    Emphysema Brother    Aneurysm Brother        x3   Kidney failure Brother    Diabetes Brother    Diabetes Brother    Stroke Brother    Stroke Maternal Grandmother    Breast cancer Neg Hx    Colon cancer Neg Hx    Heart attack Neg Hx    Esophageal cancer Neg Hx    Rectal cancer Neg Hx    Stomach cancer Neg Hx     SOCIAL HISTORY Social History   Tobacco Use   Smoking status: Former    Packs/day: 0.25    Years: 5.00    Total pack years: 1.25    Types: Cigarettes    Quit date: 01/29/1998    Years since quitting: 23.4   Smokeless tobacco: Never   Tobacco comments:    Quit in 2001  Vaping Use   Vaping Use: Never used  Substance Use Topics   Alcohol use: No    Alcohol/week: 0.0 standard drinks of alcohol   Drug use: No         OPHTHALMIC EXAM:  Base Eye Exam     Visual Acuity (ETDRS)       Right Left   Dist [place] 20/20 -2 20/25 -2 +1         Tonometry (Tonopen, 11:05 AM)  Right Left   Pressure 14 15         Pupils       Pupils Dark Light Shape React APD   Right PERRL 3 3 Round Brisk None   Left PERRL 3 3 Round Brisk None         Visual Fields       Left Right    Full Full         Extraocular Movement       Right Left    Full Full         Neuro/Psych     Oriented x3: Yes   Mood/Affect: Normal         Dilation     Both eyes: 1.0% Mydriacyl, 2.5% Phenylephrine @ 11:05 AM           Slit Lamp and Fundus Exam     External Exam       Right Left   External Normal Normal         Slit Lamp Exam       Right Left   Lids/Lashes Normal Normal   Conjunctiva/Sclera White and  quiet White and quiet   Cornea Clear Clear   Anterior Chamber Deep and quiet Deep and quiet   Iris Round and reactive Round and reactive   Lens Centered posterior chamber intraocular lens Centered posterior chamber intraocular lens   Anterior Vitreous Normal Normal         Fundus Exam       Right Left   Posterior Vitreous Posterior vitreous detachment Posterior vitreous detachment   Disc Normal Normal   C/D Ratio 0.25 0.25   Macula Normal Normal   Vessels no DR no DR   Periphery Normal Normal            IMAGING AND PROCEDURES  Imaging and Procedures for 07/17/21  OCT, Retina - OU - Both Eyes       Right Eye Quality was good. Scan locations included subfoveal. Central Foveal Thickness: 223. Progression has been stable. Findings include normal observations.   Left Eye Quality was good. Scan locations included subfoveal. Central Foveal Thickness: 219. Progression has been stable. Findings include normal observations.   Notes Posterior vitreous detachment OU normal fovea OU              ASSESSMENT/PLAN:  Posterior vitreous detachment of left eye OS physiologic stable  Posterior vitreous detachment of right eye OD physiologic and stable  Diabetes mellitus without complication ([place]) No DR today     ICD-10-CM   1. Posterior vitreous detachment of right eye  H43.811 OCT, Retina - OU - Both Eyes    2. Posterior vitreous detachment of left eye  H43.812     3. Diabetes mellitus without complication ([place])  G62.6       1.  No detectable diabetic retinopathy  2.  No holes or tears observed  3.  Ophthalmic Meds Ordered this visit:  No orders of the defined types were placed in this encounter.      Return in about 2 years (around 07/18/2023) for DILATE OU, COLOR FP, OCT.  There are no Patient Instructions on file for this visit.   Explained the diagnoses, plan, and follow up with the patient and they expressed understanding.  Patient expressed  understanding of the importance of proper follow up care.   Clent Demark Kipton Skillen M.D. Diseases & Surgery of the Retina and Vitreous Retina & Diabetic [place] 07/17/21  Abbreviations: M myopia (nearsighted); A astigmatism; H hyperopia (farsighted); P presbyopia; Mrx spectacle prescription;  CTL contact lenses; OD right eye; OS left eye; OU both eyes  XT exotropia; ET esotropia; PEK punctate epithelial keratitis; PEE punctate epithelial erosions; DES dry eye syndrome; MGD meibomian gland dysfunction; ATs artificial tears; PFAT's preservative free artificial tears; [place] nuclear sclerotic cataract; PSC posterior subcapsular cataract; ERM epi-retinal membrane; PVD posterior vitreous detachment; RD retinal detachment; DM diabetes mellitus; DR diabetic retinopathy; NPDR non-proliferative diabetic retinopathy; PDR proliferative diabetic retinopathy; CSME clinically significant macular edema; DME diabetic macular edema; dbh dot blot hemorrhages; CWS cotton wool spot; POAG primary open angle glaucoma; C/D cup-to-disc ratio; HVF humphrey visual field; GVF goldmann visual field; OCT optical coherence tomography; IOP intraocular pressure; BRVO Branch retinal vein occlusion; CRVO central retinal vein occlusion; CRAO central retinal artery occlusion; BRAO branch retinal artery occlusion; RT retinal tear; SB scleral buckle; PPV pars plana vitrectomy; VH Vitreous hemorrhage; PRP panretinal laser photocoagulation; IVK intravitreal kenalog; VMT vitreomacular traction; MH Macular hole;  NVD neovascularization of the disc; NVE neovascularization elsewhere; AREDS age related eye disease study; ARMD age related macular degeneration; POAG primary open angle glaucoma; EBMD epithelial/anterior basement membrane dystrophy; ACIOL anterior chamber intraocular lens; IOL intraocular lens; PCIOL posterior chamber intraocular lens; Phaco/IOL phacoemulsification with intraocular lens placement; [place] photorefractive keratectomy; LASIK laser  assisted in situ keratomileusis; HTN hypertension; DM diabetes mellitus; COPD chronic obstructive pulmonary disease

## 2021-07-17 NOTE — Assessment & Plan Note (Signed)
OS physiologic stable

## 2021-07-18 ENCOUNTER — Ambulatory Visit: Payer: Medicare Other | Admitting: Orthopaedic Surgery

## 2021-07-25 DIAGNOSIS — M542 Cervicalgia: Secondary | ICD-10-CM | POA: Diagnosis not present

## 2021-07-26 ENCOUNTER — Ambulatory Visit: Payer: Medicare Other | Admitting: Adult Health

## 2021-07-27 ENCOUNTER — Ambulatory Visit (INDEPENDENT_AMBULATORY_CARE_PROVIDER_SITE_OTHER): Payer: Medicare Other | Admitting: Adult Health

## 2021-07-27 VITALS — BP 151/82 | HR 61 | Ht 62.0 in | Wt 206.0 lb

## 2021-07-27 DIAGNOSIS — M542 Cervicalgia: Secondary | ICD-10-CM | POA: Diagnosis not present

## 2021-07-27 DIAGNOSIS — I2510 Atherosclerotic heart disease of native coronary artery without angina pectoris: Secondary | ICD-10-CM

## 2021-07-27 DIAGNOSIS — G43709 Chronic migraine without aura, not intractable, without status migrainosus: Secondary | ICD-10-CM | POA: Diagnosis not present

## 2021-07-27 DIAGNOSIS — I2583 Coronary atherosclerosis due to lipid rich plaque: Secondary | ICD-10-CM

## 2021-07-27 MED ORDER — NURTEC 75 MG PO TBDP: ORAL_TABLET | ORAL | 11 refills | Status: DC

## 2021-07-27 MED ORDER — AIMOVIG 70 MG/ML ~~LOC~~ SOAJ: 70.0000 mg | SUBCUTANEOUS | 11 refills | Status: DC

## 2021-07-27 NOTE — Progress Notes (Signed)
PATIENT: Samantha [name] DOB: Jan 14, 1941  REASON FOR VISIT: follow up HISTORY FROM: patient PRIMARY NEUROLOGIST: Dr. Krista Blue  Chief Complaint  Patient presents with   Follow-up    Pt reports her neck pain is gone some soreness but she still is having migraines. She states that she needs more Nurtec. Room 19, alone     HISTORY OF PRESENT ILLNESS: Today 07/27/21:  [name] is a 81 year old female with a history of neck pain and migraine headaches.  She returns today for follow-up.  Reports that Cymbalta has been helpful. States neck pain is much better since she saw Dr. Krista Blue. She did she neurosurgery and had ESI. She does feel that it helped.   Reports that she is having more migraines. Having daily headache. On the left side feels that it may coming from the neck. Never got Aimovig. She is using nurtec and reports that it helps. No nausea or vomiting but does have both light and noise sensitivity. May also have trouble with balance when she has a headache.   HISTORY Samantha [name] is a 81 year old female, seen in request by orthopedic surgeon Rodell Perna C, for evaluation of severe neck pain, radiating pain to occipital region, frequent headaches, her primary care physician is Dr. Quay Burow, [place] [facility]   I reviewed and summarized the referring note.PMHX RA, TIA DM Chronic kidney disease. HTN Cervical decompression surgery in 2000, prior to surgery, she has significant right cervical radiculopathy   She did have a history of cervical decompression surgery in 2000, prior to the surgery, she presented with right cervical radiculopathy, surgery did help her symptoms significantly  Since January 2023, she began to noticed significant headaches, no clear trigger noted, she now complains of neck pain, radiating pain to left occipital region, then settled behind her left eye, worsening bilateral ear tinnitus, her left side neck pain headache happening on a daily basis,  She also seems to  have worsening left-sided difficulty, left leg gave out underneath her, dragging left toes, rely on a walker now, also had worsening urinary urgency involving bowel and bladder,   I personally reviewed x-ray cervical region December 2022, 3 level cervical fusion C3-4, adjacent C6-7 disc space narrowing and spondylosis   She also have significant low back pain, is under the care of of Dr. 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  ALLERGIES: Allergies  Allergen Reactions   Bactrim [Sulfamethoxazole-Trimethoprim] Other (See Comments)    See OV 09-15-13, rash-tongue swelling due to bactrim ?   Cefuroxime Axetil Hives   Oxycodone Nausea And Vomiting   Pravastatin Other (See Comments)    "muscle breakdown" with profuse sweating   Terfenadine Hives    Other reaction(s): Other (See Comments)   Zocor [Simvastatin] Other (See Comments)    2012 "Muscle breakdown " with profuse sweating   Tramadol Nausea And Vomiting   Atorvastatin Other (See Comments)    Pt reports muscle aches and joint pains   Cefuroxime Other (See Comments)   Lactose Intolerance (Gi) Diarrhea   Latex  blisters   Lime Flavor [Flavoring Agent] Diarrhea   Metformin And Related Diarrhea   Oxycodone Hcl Other (See Comments)   Pravastatin Sodium Other (See Comments)   Rosuvastatin Other (See Comments)    Pt reports "myalgias, fatigue, nosebleeds."   Rosuvastatin Calcium Other (See Comments)   Tape Other (See Comments)     blisters    HOME MEDICATIONS: Outpatient Medications Prior to Visit  Medication Sig Dispense Refill   acetaminophen (TYLENOL) 500 MG tablet Take 500 mg by mouth every 6 (six) hours as needed for mild pain.      albuterol (PROVENTIL) (2.5 MG/3ML) 0.083% nebulizer solution Take 3 mLs (2.5 mg total) by nebulization every 6 (six) hours as needed for wheezing or shortness of breath. 150 mL 0   Calcium-Magnesium-Zinc (CAL-MAG-ZINC PO) Take 1 tablet by mouth daily.     cetirizine (ZYRTEC) 10 MG tablet TAKE 1 TABLET(10 MG) BY MOUTH DAILY 90 tablet 3   cholecalciferol (VITAMIN D) 1000 UNITS tablet Take 1,000 Units by mouth every morning.      clopidogrel (PLAVIX) 75 MG tablet TAKE 1 TABLET(75 MG) BY MOUTH DAILY 90 tablet 1   DULoxetine (CYMBALTA) 30 MG capsule Take 1 capsule (30 mg total) by mouth daily. 90 capsule 3   Erenumab-aooe (AIMOVIG) 70 MG/ML SOAJ Inject 70 mg into the skin every 30 (thirty) days. 1 mL 11   Evolocumab (REPATHA SURECLICK) 355 MG/ML SOAJ INJECT 1 PEN UNDER THE SKIN EVERY 14 DAYS 6 mL 3   famotidine (PEPCID) 40 MG tablet TAKE 1 TABLET(40 MG) BY MOUTH DAILY AS NEEDED FOR HEARTBURN OR INDIGESTION 30 tablet 5   FARXIGA 10 MG TABS tablet TAKE 1 TABLET(10 MG) BY MOUTH DAILY BEFORE BREAKFAST 30 tablet 5   glucose blood test strip Use as instructed 100 each 12   hydroxychloroquine (PLAQUENIL) 200 MG tablet Take 400 mg by mouth daily.      isosorbide mononitrate (IMDUR) 30 MG 24 hr tablet Take 3 tablets (90 mg total) by mouth daily. 270 tablet 1   Lancets (ONETOUCH ULTRASOFT) lancets Use as instructed 100 each 12   losartan (COZAAR) 25 MG tablet TAKE 1 TABLET(25 MG) BY MOUTH DAILY 90 tablet 3   nebivolol (BYSTOLIC) 10 MG tablet TAKE 1 TABLET(10 MG) BY MOUTH DAILY 90 tablet 2   nitroGLYCERIN (NITROSTAT) 0.4 MG SL tablet DISSOLVE 1 TABLET UNDER THE TONGUE EVERY 5 MINUTES AS NEEDED FOR CHEST PAIN 25 tablet 7   Polyethyl Glycol-Propyl Glycol (SYSTANE) 0.4-0.3 % GEL ophthalmic gel Place 1  application into both eyes daily as needed (for dry eyes).      pregabalin (LYRICA) 75 MG capsule Take 1 capsule (75 mg total) by mouth 3 (three) times daily. 90 capsule 5   psyllium (METAMUCIL) 58.6 % powder Take 1 packet by mouth daily.     ranolazine (RANEXA) 500 MG 12 hr tablet Take 1 tablet (500 mg total) by mouth 2 (two) times daily. 180 tablet 3   Rimegepant Sulfate (NURTEC) 75 MG TBDP Take 1 tab at onset of migraine.  May repeat in 2 hrs, if needed.  Max dose: 2 tabs/day. This is a 30 day prescription. 12 tablet 11   SYMBICORT 80-4.5 MCG/ACT inhaler INHALE 2 PUFFS INTO THE LUNGS TWICE DAILY 10.2 g 5   torsemide (DEMADEX) 20 MG tablet Take one tablet by mouth every other day 45 tablet 3   VENTOLIN HFA 108 (90 Base) MCG/ACT inhaler INHALE 2 PUFFS INTO THE LUNGS EVERY 6 HOURS AS NEEDED FOR WHEEZING  OR SHORTNESS OF BREATH 18 g 11   No facility-administered medications prior to visit.    PAST MEDICAL HISTORY: Past Medical History:  Diagnosis Date   A-fib [facility])    Abnormal CT of the chest 2008   last CT4-l 2009:  . No f/u suggested    Allergy    Asthma    CAD (coronary artery disease)    a. Coronary CTA 10/16: Coronary Ca score 211, mod non-obstructive CAD with LM mild plaque (25-50%), mid LAD 50-69%. b. Neg nuc 06/2015.   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   Minimally invasive right hemicolectomy to be done    Neuromuscular disorder (HCC)    FIBROMYALGIA   Ocular migraine    OSA (obstructive sleep apnea) 09/2007   dx w/ a sleep study, not on  CPAP   Osteoarthritis    Osteoporosis    Pneumonia    "double" in 2004   PONV  (postoperative nausea and vomiting)    Reactive airway disease 01/29/2002   dx of pseudoasthma / vcd in 2005 and nl sprirometry History of dyspnea, 2011,  improved after several medications were changed around Question of COPD, disproved July 06, 2009 with nl pft's       Rheumatoid factor positive    Shingles 11/2009   Sleep apnea    Stroke ([place])    TIA (transient ischemic attack)    x2 - on Plavix for this   Torn rotator cuff    right worse than left, both are torn   Tumor, 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 Pernell Dupre (done after colon surgery)   CATARACT EXTRACTION W/ INTRAOCULAR LENS  IMPLANT, BILATERAL  2012   COLON SURGERY  10/2014   EYE SURGERY Bilateral    torq lens for cataracts   LEFT HEART CATH AND CORONARY ANGIOGRAPHY N/A 03/22/2016   Procedure: Left Heart Cath and Coronary Angiography;  Surgeon: Belva Crome, MD;  Location: [facility];  Service: Cardiovascular;  Laterality: N/A;   LUMBAR LAMINECTOMY/DECOMPRESSION MICRODISCECTOMY N/A 01/20/2018   Procedure: L4-5 decompression;  Surgeon: Marybelle Killings, MD;  Location: [facility];  Service: Orthopedics;  Laterality: N/A;   THYROIDECTOMY, PARTIAL  1960's   TOTAL KNEE ARTHROPLASTY Right 12/04/2019   Procedure: RIGHT TOTAL KNEE ARTHROPLASTY;  Surgeon: Marybelle Killings, MD;  Location: [facility];  Service: Orthopedics;  Laterality: Right;  RNFA [place]   "Laser surgery for vaginal cancer; followed by chemotherapy" (06/27/2012)    FAMILY HISTORY: Family History  Problem  Relation Age of Onset    Heart disease Father    Heart disease Mother    Lung cancer Mother    Allergies Sister    Parkinsonism Sister        possible   Asthma Sister    Asthma Paternal Grandmother    Stroke Paternal Grandmother    Heart disease Other        paternal grandparents, maternal grandparents,    Heart disease Brother    Emphysema Brother    Aneurysm Brother        x3   Kidney failure Brother    Diabetes Brother    Diabetes Brother    Stroke Brother    Stroke Maternal Grandmother    Breast cancer Neg Hx    Colon cancer Neg Hx    Heart attack Neg Hx    Esophageal cancer Neg Hx    Rectal cancer Neg Hx    Stomach cancer Neg Hx     SOCIAL HISTORY: Social History   Socioeconomic History   Marital status: Married    Spouse name: Ilona Sorrel   Number of children: 2   Years of education: masters   Highest education level: Not on file  Occupational History   Occupation: Retired, disable since 2000    Employer: RETIRED  Tobacco Use   Smoking status: Former    Packs/day: 0.25    Years: 5.00    Total pack years: 1.25    Types: Cigarettes    Quit date: 01/29/1998    Years since quitting: 23.5   Smokeless tobacco: Never   Tobacco comments:    Quit in 2001  Vaping Use   Vaping Use: Never used  Substance and Sexual Activity   Alcohol use: No    Alcohol/week: 0.0 standard drinks of alcohol   Drug use: No   Sexual activity: Never  Other Topics Concern   Not on file  Social History Narrative   On disability since 2000--- also husband has MS   Education. College.   Right handed.   Social Determinants of Health   Financial Resource Strain: Low Risk  (12/16/2020)   Overall Financial Resource Strain (CARDIA)    Difficulty of Paying Living Expenses: Not hard at all  Food Insecurity: No Food Insecurity (03/29/2021)   Hunger Vital Sign    Worried About Running Out of Food in the Last Year: Never true    Ran Out of Food in the Last Year: Never true  Transportation Needs: No Transportation  Needs (05/18/2021)   PRAPARE - Hydrologist (Medical): No    Lack of Transportation (Non-Medical): No  Physical Activity: Insufficiently Active (12/16/2020)   Exercise Vital Sign    Days of Exercise per Week: 4 days    Minutes of Exercise per Session: 30 min  Stress: No Stress Concern Present (12/16/2020)   [place]    Feeling of Stress : Not at all  Social Connections: Moderately Isolated (12/16/2020)   Social Connection and Isolation Panel [NHANES]    Frequency of Communication with Friends and Family: Twice a week    Frequency of Social Gatherings with Friends and Family: Twice a week    Attends Religious Services: More than 4 times per year    Active Member of [employer] or Organizations: No    Attends Archivist Meetings: Never    Marital Status: Widowed  Intimate Partner Violence: Not At Risk (12/16/2020)  Humiliation, Afraid, Rape, and Kick questionnaire    Fear of Current or Ex-Partner: No    Emotionally Abused: No    Physically Abused: No    Sexually Abused: No      PHYSICAL EXAM  Vitals:   07/27/21 0905  BP: (!) 151/82  Pulse: 61  Weight: 206 lb (93.4 kg)  Height: '5\' 2"'$  (1.575 m)   Body mass index is 37.68 kg/m.  Generalized: Well developed, in no acute distress   Neurological examination  Mentation: Alert oriented to time, place, history taking. Follows all commands speech and language fluent Cranial nerve II-XII: Pupils were equal round reactive to light. Extraocular movements were full, visual field were full on confrontational test. Facial sensation and strength were normal. Uvula tongue midline. Head turning and shoulder shrug  were normal and symmetric. Motor: The motor testing reveals 5 over 5 strength of all 4 extremities. Good symmetric motor tone is noted throughout.  Sensory: Sensory testing is intact to soft touch on all 4 extremities. No evidence  of extinction is noted.  Coordination: Cerebellar testing reveals good finger-nose-finger and heel-to-shin bilaterally.  Gait and station: Gait is normal. .  Reflexes: Deep tendon reflexes are symmetric and normal bilaterally.   DIAGNOSTIC DATA (LABS, IMAGING, TESTING) - I reviewed patient records, labs, notes, testing and imaging myself where available.  Lab Results  Component Value Date   WBC 3.6 (L) 02/21/2021   HGB 13.5 02/21/2021   HCT 41.4 02/21/2021   MCV 84.8 02/21/2021   PLT 128.0 (L) 02/21/2021      Component Value Date/Time   NA 138 02/21/2021 1205   NA 141 09/20/2020 1332   K 4.0 02/21/2021 1205   CL 101 02/21/2021 1205   CO2 28 02/21/2021 1205   GLUCOSE 103 (H) 02/21/2021 1205   BUN 22 02/21/2021 1205   BUN 26 09/20/2020 1332   CREATININE 1.25 (H) 02/21/2021 1205   CREATININE 0.84 07/06/2015 0916   CALCIUM 9.5 02/21/2021 1205   PROT 7.3 02/21/2021 1205   PROT 6.5 10/01/2018 1036   ALBUMIN 4.4 02/21/2021 1205   ALBUMIN 4.2 10/01/2018 1036   AST 15 02/21/2021 1205   ALT 14 02/21/2021 1205   ALKPHOS 50 02/21/2021 1205   BILITOT 0.4 02/21/2021 1205   BILITOT 0.3 10/01/2018 1036   GFRNONAA 55 (L) 09/09/2020 1347   GFRNONAA 73 08/14/2011 1102   GFRAA 60 07/17/2019 1235   GFRAA 84 08/14/2011 1102   Lab Results  Component Value Date   CHOL 137 02/21/2021   HDL 89.30 02/21/2021   LDLCALC 32 02/21/2021   LDLDIRECT 126.2 12/07/2010   TRIG 78.0 02/21/2021   CHOLHDL 2 02/21/2021   Lab Results  Component Value Date   HGBA1C 6.6 (H) 02/21/2021   Lab Results  Component Value Date   VITAMINB12 345 11/02/2014   Lab Results  Component Value Date   TSH 2.49 10/20/2020      ASSESSMENT AND PLAN 81 y.o. year old female  has a past medical history of A-fib ([place]), Abnormal CT of the chest (2008), Allergy, Asthma, CAD (coronary artery disease), Cataract, Chronic diastolic CHF (congestive heart failure) ([place]), Collagen vascular disease ([place]), Complication of  anesthesia, Fibromyalgia, GERD (gastroesophageal reflux disease), H/O hiatal hernia, Heart murmur, Hyperlipidemia, Hypertension, Malignant neoplasm of ascending colon ([place]) (2016), Neuromuscular disorder ([place]), Ocular migraine, OSA (obstructive sleep apnea) (09/2007), Osteoarthritis, Osteoporosis, Pneumonia, PONV (postoperative nausea and vomiting), Reactive airway disease (01/29/2002), Rheumatoid factor positive, Shingles (11/2009), Sleep apnea, Stroke ([place]), TIA (transient ischemic attack),  Torn rotator cuff, Tumor, thyroid, Type II diabetes mellitus ([place]), Vaginal cancer ([place]) (1994), and Vaginal dysplasia. here with:  Neck pain   Stable  Remain on Cymbalta 30 mg daily  2. Migraine headaches  New prescription sent for Aimovig 70 mg monthly injection Continue Nurtec 75 mg   FU in 6 months or sooner if needed.     Ward Givens, MSN, NP-C 07/27/2021, 9:06 AM [facility] [address] [place], [place] [address] [phone]

## 2021-08-02 ENCOUNTER — Ambulatory Visit (INDEPENDENT_AMBULATORY_CARE_PROVIDER_SITE_OTHER): Payer: Medicare Other | Admitting: *Deleted

## 2021-08-02 DIAGNOSIS — I10 Essential (primary) hypertension: Secondary | ICD-10-CM

## 2021-08-02 DIAGNOSIS — E1142 Type 2 diabetes mellitus with diabetic polyneuropathy: Secondary | ICD-10-CM

## 2021-08-02 NOTE — Chronic Care Management (AMB) (Signed)
Chronic Care Management   CCM RN Visit Note  08/02/2021 Name: Samantha [name] MRN: [id number] DOB: Aug 10, 1940  Subjective: Samantha [name] is a 81 y.o. year old female who is a primary care patient of Burns, Claudina Lick, MD. The care management team was consulted for assistance with disease management and care coordination needs.    Engaged with patient by telephone for follow up visit/ CCM RN CM case closure in response to provider referral for case management and/or care coordination services.   Consent to Services:  The patient was given information about Chronic Care Management services, agreed to services, and gave verbal consent prior to initiation of services.  Please see initial visit note for detailed documentation.  Patient agreed to services and verbal consent obtained.   Assessment: Review of patient past medical history, allergies, medications, health status, including review of consultants reports, laboratory and other test data, was performed as part of comprehensive evaluation and provision of chronic care management services.   SDOH (Social Determinants of Health) assessments and interventions performed:  SDOH Interventions    Flowsheet Row Most Recent Value  SDOH Interventions   Food Insecurity Interventions Intervention Not Indicated  [continues to deny food insecurity]  Transportation Interventions Intervention Not Indicated  [patient has resumed driving self]     CCM Care Plan  Allergies  Allergen Reactions   Bactrim [Sulfamethoxazole-Trimethoprim] Other (See Comments)    See OV 09-15-13, rash-tongue swelling due to bactrim ?   Cefuroxime Axetil Hives   Oxycodone Nausea And Vomiting   Pravastatin Other (See Comments)    "muscle breakdown" with profuse sweating   Terfenadine Hives    Other reaction(s): Other (See Comments)   Zocor [Simvastatin] Other (See Comments)    2012 "Muscle breakdown " with profuse sweating   Tramadol Nausea And Vomiting    Atorvastatin Other (See Comments)    Pt reports muscle aches and joint pains   Cefuroxime Other (See Comments)   Lactose Intolerance (Gi) Diarrhea   Latex     blisters   Lime Flavor [Flavoring Agent] Diarrhea   Metformin And Related Diarrhea   Oxycodone Hcl Other (See Comments)   Pravastatin Sodium Other (See Comments)   Rosuvastatin Other (See Comments)    Pt reports "myalgias, fatigue, nosebleeds."   Rosuvastatin Calcium Other (See Comments)   Tape Other (See Comments)    blisters   Outpatient Encounter Medications as of 08/02/2021  Medication Sig   acetaminophen (TYLENOL) 500 MG tablet Take 500 mg by mouth every 6 (six) hours as needed for mild pain.    albuterol (PROVENTIL) (2.5 MG/3ML) 0.083% nebulizer solution Take 3 mLs (2.5 mg total) by nebulization every 6 (six) hours as needed for wheezing or shortness of breath.   Calcium-Magnesium-Zinc (CAL-MAG-ZINC PO) Take 1 tablet by mouth daily.   cetirizine (ZYRTEC) 10 MG tablet TAKE 1 TABLET(10 MG) BY MOUTH DAILY   cholecalciferol (VITAMIN D) 1000 UNITS tablet Take 1,000 Units by mouth every morning.    clopidogrel (PLAVIX) 75 MG tablet TAKE 1 TABLET(75 MG) BY MOUTH DAILY   DULoxetine (CYMBALTA) 30 MG capsule Take 1 capsule (30 mg total) by mouth daily.   Erenumab-aooe (AIMOVIG) 70 MG/ML SOAJ Inject 70 mg into the skin every 30 (thirty) days.   Evolocumab (REPATHA SURECLICK) 559 MG/ML SOAJ INJECT 1 PEN UNDER THE SKIN EVERY 14 DAYS   famotidine (PEPCID) 40 MG tablet TAKE 1 TABLET(40 MG) BY MOUTH DAILY AS NEEDED FOR HEARTBURN OR INDIGESTION   FARXIGA 10 MG TABS tablet  TAKE 1 TABLET(10 MG) BY MOUTH DAILY BEFORE BREAKFAST   glucose blood test strip Use as instructed   hydroxychloroquine (PLAQUENIL) 200 MG tablet Take 400 mg by mouth daily.    isosorbide mononitrate (IMDUR) 30 MG 24 hr tablet Take 3 tablets (90 mg total) by mouth daily.   Lancets (ONETOUCH ULTRASOFT) lancets Use as instructed   losartan (COZAAR) 25 MG tablet TAKE 1  TABLET(25 MG) BY MOUTH DAILY   nebivolol (BYSTOLIC) 10 MG tablet TAKE 1 TABLET(10 MG) BY MOUTH DAILY   nitroGLYCERIN (NITROSTAT) 0.4 MG SL tablet DISSOLVE 1 TABLET UNDER THE TONGUE EVERY 5 MINUTES AS NEEDED FOR CHEST PAIN   Polyethyl Glycol-Propyl Glycol (SYSTANE) 0.4-0.3 % GEL ophthalmic gel Place 1 application into both eyes daily as needed (for dry eyes).    pregabalin (LYRICA) 75 MG capsule Take 1 capsule (75 mg total) by mouth 3 (three) times daily.   psyllium (METAMUCIL) 58.6 % powder Take 1 packet by mouth daily.   ranolazine (RANEXA) 500 MG 12 hr tablet Take 1 tablet (500 mg total) by mouth 2 (two) times daily.   Rimegepant Sulfate (NURTEC) 75 MG TBDP Take 1 tab at onset of migraine.  May repeat in 2 hrs, if needed.  Max dose: 2 tabs/day. This is a 30 day prescription.   SYMBICORT 80-4.5 MCG/ACT inhaler INHALE 2 PUFFS INTO THE LUNGS TWICE DAILY   torsemide (DEMADEX) 20 MG tablet Take one tablet by mouth every other day   VENTOLIN HFA 108 (90 Base) MCG/ACT inhaler INHALE 2 PUFFS INTO THE LUNGS EVERY 6 HOURS AS NEEDED FOR WHEEZING OR SHORTNESS OF BREATH   No facility-administered encounter medications on file as of 08/02/2021.   Patient Active Problem List   Diagnosis Date Noted   Right calf pain 06/16/2021   Neck pain 04/17/2021   Intractable headache 04/17/2021   Chronic migraine w/o aura w/o status migrainosus, not intractable 04/17/2021   Urinary frequency 02/21/2021   Occipital neuralgia of left side 02/21/2021   CKD (chronic kidney disease) stage 3, GFR 30-59 ml/min (HCC) 02/20/2021   Spinal stenosis of cervical region 02/16/2021   Rheumatoid arthritis ([place]) - Dr Dossie Der 10/20/2020   Neuropathy 10/19/2020   Vitamin D deficiency 04/13/2020   S/P TKR (total knee replacement), 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   Aortic atherosclerosis ([place]) 11/03/2019   Posterior vitreous detachment of right eye 07/15/2019   Posterior vitreous detachment of left eye 07/15/2019   Diabetes mellitus without  complication ([place]) 39/76/7341   Recurrent corneal erosion, 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   Raynaud phenomenon 05/06/2019   Trigger finger, left ring finger 03/18/2019   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 01/28/2018   Sacral back pain 10/16/2017   Left-sided nosebleed 07/23/2017   Chronic left upper quadrant pain 07/23/2017   Poor balance 03/14/2017   Angina pectoris ([place]) 03/22/2016   Hypertensive heart disease 07/07/2015   Essential hypertension 07/07/2015   Lightheadedness 07/07/2015   Plantar fasciitis, left 03/22/2015   Coronary artery disease due to lipid rich plaque 01/05/2015   Chronic diastolic CHF (congestive heart failure), NYHA class 2 ([place]) 01/05/2015   Malignant neoplasm of ascending colon  pT1, pN0, rM0 s/p robotic colectomy 11/11/2014 11/11/2014   Migraine (Ocular) 01/05/2014   VBI (vertebrobasilar insufficiency) 08/22/2012   TIA (transient ischemic attack) 06/27/2012   DJD (degenerative joint disease) 02/02/2011   Varicose veins of legs 06/06/2010   Palpitations 11/23/2008   UTI'S, RECURRENT 09/28/2008   Fibromyalgia 08/15/2007   DM II (diabetes mellitus, type II),  w/ neuropathy 05/21/2006   Hyperlipidemia 05/21/2006   Asthma 01/29/2002   Conditions to be addressed/monitored:  CHF, HTN, and DMII  Care Plan : RN Care Manager Plan of Care  Updates made by Knox Royalty, RN since 09/01/21 12:00 AM     Problem: Chronic Disease Management Needs   Priority: High     Long-Range Goal: Ongoing adherence to established plan of care for long term chronic disease management   Start Date: 01/24/2021  Expected End Date: 01/24/2022  Priority: High  Note:   Current Barriers:  Chronic Disease Management support and education needs related to CHF and DMII Chronic pain- well managed with medications Caregiver for daughter, has cancer currently under control; patient's brother currently has cancer and patient is periodically his caregiver-  brother lives in [place], [place]: 01/24/21- patient declines need for caregiver resources/ support 05/04/21- reports her brother has recently passed away September 01, 2021- reports her daughter's cancer has returned; reports she was recently informed that daughter has stage 4 bone cancer with mets to liver and lung; patient understandly upset by this news and asks that I make Dr. Quay Burow aware: I will do  RNCM Clinical Goal(s):  Patient will demonstrate ongoing health management independence as evidenced by adherence to plan of care for CHF/ DMII        through collaboration with RN Care manager, provider, and care team.   Interventions: 1:1 collaboration with primary care provider regarding development and update of comprehensive plan of care as evidenced by provider attestation and co-signature Inter-disciplinary care team collaboration (see longitudinal plan of care) Evaluation of current treatment plan related to  self management and patient's adherence to plan as established by provider Initial assessment completed 01/24/21 Review of patient status, including review of consultants reports, relevant laboratory and other test results, and medications completed SDOH updated: no new/ unmet concerns identified: patient has resumed driving self Pain assessment updated: continues to complain of chronic back/neck pain; states "much much better" since recent epidural injection- denies pain today; additionally, she continues to experience episodic migraine headaches Falls assessment updated: continues to deny new/ recent falls x 12 months- continues using cane and walker as/ if indicated;  positive reinforcement provided with encouragement to continue efforts at fall prevention; previously provided education around fall risks/ prevention reinforced Reviewed recent [place] visit for nosebleeding: reports this "is much better" and confirms she is using nasal spray prescribed by [facility] provider, as well as vaseline in nasal  lining  Reviewed recent neurology provider office visit 07/27/21- verbalizes good understanding of post-visit instructions and denies questions Patient tells me today that she believes she may be developing a UTI due to the stress she has been under with her daughter's recent recurrence of CA treatments: she is requesting that Dr. Quay Burow place lab order for urinalysis to be done at PCP office-- I will make this request today; encouraged patient to call office prior to going in to make sure Dr. Quay Burow has ordered the urinalysis- she verbalizes understanding and states she will do Medications discussed: reports continues to independently self-manage and denies current concerns/ issues/ questions around medications; endorses adherence to taking all medications as prescribed Reviewed upcoming scheduled provider appointments: 08/24/21- PCP; 09/22/21- GI provider; 10/27/21- cardiology provider; patient confirms is aware of all and has plans to attend as scheduled Discussed plans with patient for ongoing care management follow up- patient denies current care coordination/ care management needs and is agreeable to CCM RN CM case closure today; verbalizes understanding to contact PCP  or other care providers for any needs that arise in the future, and confirms she has contact information for all care providers     Heart Failure Interventions:  (Status: 08/02/21 Goal Met.)  Long Term Goal  Discussed importance of daily weight and advised patient to weigh and record daily Reviewed role of diuretics in prevention of fluid overload and management of heart failure Discussed the importance of keeping all appointments with provider Confirmed patient continues to monitor/ records daily weights at home: continues to report weights "steady" and reports consistent weights between 198-203 lbs; denies weight gain > 3 lbs overnight/ 5 lbs in one week;  confirmed no shortness of breath, no swelling over baseline Reinforced previously  provided education around action plan for weight gain; she verbalizes a good general understanding of standard guideline to report weight gain > 3 lbs overnight/ 5 lbs in one week Confirmed patient continues following low salt diet: positive reinforcement provided with encouragement to continue efforts Confirmed patient continues monitoring blood pressures at home: she reports ranges between 102-160/60-80; denies dizziness/ concerns around blood pressure readings at home  Diabetes:  (Status: 08/02/21: Goal Met.) Long Term Goal   Lab Results  Component Value Date   HGBA1C 6.6 (H) 02/21/2021  Reviewed recent blood sugars at home: reports fasting values "are all good;" she is not near her recorded blood sugars from home monitoring but states they "have all been under control and doing fine" Reinforced previously provided education around signs/ symptoms and corresponding action plan for hypoglycemia- patient verbalizes good understanding of same, will benefit from ongoing reinforcement/ support/ education/ reminding- she denies recent episodes/ signs/ symptoms low blood sugar and verbalizes good understanding of same Confirmed patient continues following low sugar, carbohydrate modified, heart healthy/ low salt diet "as best" as she "can:" positive reinforcement provided with encouragement to continue efforts     Plan: No further follow up required: patient denies ongoing care coordination/ care management needs and is agreeable to CCM RN CM case closure today; CCM RN CM case closure accordingly     Oneta Rack, RN, BSN, [place] [phone]: direct office

## 2021-08-03 ENCOUNTER — Other Ambulatory Visit: Payer: Self-pay | Admitting: Internal Medicine

## 2021-08-09 ENCOUNTER — Ambulatory Visit: Payer: Medicare Other | Admitting: Internal Medicine

## 2021-08-11 ENCOUNTER — Ambulatory Visit (INDEPENDENT_AMBULATORY_CARE_PROVIDER_SITE_OTHER): Payer: Medicare Other | Admitting: Family Medicine

## 2021-08-11 ENCOUNTER — Encounter: Payer: Self-pay | Admitting: Family Medicine

## 2021-08-11 VITALS — BP 126/70 | HR 66 | Temp 98.6°F | Ht 62.0 in | Wt 203.4 lb

## 2021-08-11 DIAGNOSIS — R350 Frequency of micturition: Secondary | ICD-10-CM | POA: Diagnosis not present

## 2021-08-11 DIAGNOSIS — N3001 Acute cystitis with hematuria: Secondary | ICD-10-CM | POA: Diagnosis not present

## 2021-08-11 DIAGNOSIS — I2510 Atherosclerotic heart disease of native coronary artery without angina pectoris: Secondary | ICD-10-CM

## 2021-08-11 DIAGNOSIS — I2583 Coronary atherosclerosis due to lipid rich plaque: Secondary | ICD-10-CM

## 2021-08-11 LAB — POCT URINALYSIS DIPSTICK
Bilirubin, UA: NEGATIVE
Blood, UA: 10
Glucose, UA: POSITIVE — AB
Ketones, UA: NEGATIVE
Leukocytes, UA: NEGATIVE
Nitrite, UA: NEGATIVE
Protein, UA: POSITIVE — AB
Spec Grav, UA: 1.025 (ref 1.010–1.025)
Urobilinogen, UA: 0.2 E.U./dL
pH, UA: 5.5 (ref 5.0–8.0)

## 2021-08-11 MED ORDER — CIPROFLOXACIN HCL 500 MG PO TABS: 500.0000 mg | ORAL_TABLET | Freq: Two times a day (BID) | ORAL | 0 refills | Status: AC

## 2021-08-11 NOTE — Patient Instructions (Signed)
Please take a probiotic such as Align, Culturelle or Digestive Health for the next week since you are taking an antibiotic.

## 2021-08-11 NOTE — Progress Notes (Signed)
Subjective:  Samantha [name] is a 81 y.o. female who complains of possible urinary tract infection.  She has had symptoms for 1 week.  Symptoms include  urinary urgency, frequency, dysuria and right lower abdominal pain radiating to her right flank  . Patient denies  fever, chills,dizziness, chest pain, palpitations, shortness of breath .  Last UTI was one year ago.   Using nothing for current symptoms.     Blood sugars have been very good per patient.    Patient does not have a history of recurrent UTI. Patient does not have a history of pyelonephritis.  No other aggravating or relieving factors.  No other c/o.  Past Medical History:  Diagnosis Date   A-fib [facility])    Abnormal CT of the chest 2008   last CT4-l 2009:  . No f/u suggested    Allergy    Asthma    CAD (coronary artery disease)    a. Coronary CTA 10/16: Coronary Ca score 211, mod non-obstructive CAD with LM mild plaque (25-50%), mid LAD 50-69%. b. Neg nuc 06/2015.   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[place]) 2016   Minimally invasive right hemicolectomy to be done    Neuromuscular disorder (HCC)    FIBROMYALGIA   Ocular migraine    OSA (obstructive sleep apnea) 09/2007   dx w/ a sleep study, not on  CPAP   Osteoarthritis    Osteoporosis    Pneumonia    "double" in 2004   PONV (postoperative nausea and vomiting)    Reactive airway disease 01/29/2002   dx of pseudoasthma / vcd in 2005 and nl sprirometry History of dyspnea, 2011,  improved after several medications were changed around Question of COPD, disproved July 06, 2009 with nl pft's       Rheumatoid factor positive    Shingles  11/2009   Sleep apnea    Stroke ([place])    TIA (transient ischemic attack)    x2 - on Plavix for this   Torn rotator cuff    right worse than left, both are torn   Tumor, 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    Objective: Vitals:   08/11/21 1047  BP: 126/70  Pulse: 66  Temp: 98.6 F (37 C)  SpO2: 98%    General appearance: alert, no distress, WD/WN, female Abdomen: +bs, soft, non distended, tenderness over suprapubic area without rebound, guarding or referred pain, non tender otherwise.  Back: no CVA tenderness GU: deferred      Laboratory:  Urine dipstick: +leuks, pro and glucose     Assessment: Acute cystitis with hematuria - Plan: ciprofloxacin (CIPRO) 500 MG tablet, Urine Culture  Frequent urination - Plan: POCT Urinalysis Dipstick   Plan: Discussed symptoms, diagnosis, possible complications, and usual course of illness.  Cipro prescribed due to allergies.   Advised increased water intake, can  use OTC Tylenol for pain.    Advised to take a probiotic for the next week and she has these at home.   Urine culture sent.  Call or return if worse or not improving.

## 2021-08-13 LAB — URINE CULTURE

## 2021-08-17 ENCOUNTER — Telehealth: Payer: Self-pay | Admitting: Adult Health

## 2021-08-17 NOTE — Telephone Encounter (Addendum)
Nurtec has been approved until 01/28/22. Called pharmacy, spoke with Deatra Canter who stated she can pick up Rx tomorrow after 9 am. Called patient and informed her os previous message. Patient verbalized understanding, appreciation.

## 2021-08-17 NOTE — Telephone Encounter (Signed)
Pt is calling and said she went to [facility] to pick up her Nurtec and they told her Samantha [name] needed to fill out some paperwork for Pt to get this prescription. Pt is requesting a return call from nurse

## 2021-08-21 ENCOUNTER — Other Ambulatory Visit: Payer: Self-pay

## 2021-08-21 DIAGNOSIS — R079 Chest pain, unspecified: Secondary | ICD-10-CM

## 2021-08-21 DIAGNOSIS — R002 Palpitations: Secondary | ICD-10-CM

## 2021-08-21 MED ORDER — ISOSORBIDE MONONITRATE ER 30 MG PO TB24: 90.0000 mg | ORAL_TABLET | Freq: Every day | ORAL | 2 refills | Status: DC

## 2021-08-23 NOTE — Patient Instructions (Addendum)
     Blood work was ordered.     Medications changes include :   None      Return in about 6 months (around 02/24/2022) for follow up.

## 2021-08-23 NOTE — Progress Notes (Unsigned)
Subjective:    Patient ID: Samantha [name], female    DOB: 05/26/1940, 81 y.o.   MRN: [id number]     HPI Samantha [name] is here for follow up of her chronic medical problems, including htn, DM with neuropathy, hld, fibromyalgia, asthma, RA, HFpEF, CAD, CKD, occipital neuralgia  Overall doing well.  Her daughter is still going through chemotherapy for her stage IV uterine cancer.   has no concerns  Medications and allergies reviewed with patient and updated if appropriate.  Current Outpatient Medications on File Prior to Visit  Medication Sig Dispense Refill   acetaminophen (TYLENOL) 500 MG tablet Take 500 mg by mouth every 6 (six) hours as needed for mild pain.      albuterol (PROVENTIL) (2.5 MG/3ML) 0.083% nebulizer solution Take 3 mLs (2.5 mg total) by nebulization every 6 (six) hours as needed for wheezing or shortness of breath. 150 mL 0   Calcium-Magnesium-Zinc (CAL-MAG-ZINC PO) Take 1 tablet by mouth daily.     cetirizine (ZYRTEC) 10 MG tablet TAKE 1 TABLET(10 MG) BY MOUTH DAILY 90 tablet 3   cholecalciferol (VITAMIN D) 1000 UNITS tablet Take 1,000 Units by mouth every morning.      clopidogrel (PLAVIX) 75 MG tablet TAKE 1 TABLET(75 MG) BY MOUTH DAILY 90 tablet 1   DULoxetine (CYMBALTA) 30 MG capsule Take 1 capsule (30 mg total) by mouth daily. 90 capsule 3   Erenumab-aooe (AIMOVIG) 70 MG/ML SOAJ Inject 70 mg into the skin every 30 (thirty) days. 1 mL 11   Evolocumab (REPATHA SURECLICK) 749 MG/ML SOAJ INJECT 1 PEN UNDER THE SKIN EVERY 14 DAYS 6 mL 3   famotidine (PEPCID) 40 MG tablet TAKE 1 TABLET(40 MG) BY MOUTH DAILY AS NEEDED FOR HEARTBURN OR INDIGESTION 30 tablet 5   FARXIGA 10 MG TABS tablet TAKE 1 TABLET(10 MG) BY MOUTH DAILY BEFORE BREAKFAST 30 tablet 5   glucose blood test strip Use as instructed 100 each 12   hydroxychloroquine (PLAQUENIL) 200 MG tablet Take 400 mg by mouth daily.      isosorbide mononitrate (IMDUR) 30 MG 24 hr tablet Take 3 tablets (90 mg total)  by mouth daily. 270 tablet 2   Lancets (ONETOUCH ULTRASOFT) lancets Use as instructed 100 each 12   losartan (COZAAR) 25 MG tablet TAKE 1 TABLET(25 MG) BY MOUTH DAILY 90 tablet 3   nebivolol (BYSTOLIC) 10 MG tablet TAKE 1 TABLET(10 MG) BY MOUTH DAILY 90 tablet 2   nitroGLYCERIN (NITROSTAT) 0.4 MG SL tablet DISSOLVE 1 TABLET UNDER THE TONGUE EVERY 5 MINUTES AS NEEDED FOR CHEST PAIN 25 tablet 7   Polyethyl Glycol-Propyl Glycol (SYSTANE) 0.4-0.3 % GEL ophthalmic gel Place 1 application into both eyes daily as needed (for dry eyes).      pregabalin (LYRICA) 75 MG capsule Take 1 capsule (75 mg total) by mouth 3 (three) times daily. 90 capsule 5   psyllium (METAMUCIL) 58.6 % powder Take 1 packet by mouth daily.     ranolazine (RANEXA) 500 MG 12 hr tablet Take 1 tablet (500 mg total) by mouth 2 (two) times daily. 180 tablet 3   Rimegepant Sulfate (NURTEC) 75 MG TBDP Take 1 tab at onset of migraine.  May repeat in 2 hrs, if needed.  Max dose: 2 tabs/day. This is a 30 day prescription. 12 tablet 11   SYMBICORT 80-4.5 MCG/ACT inhaler INHALE 2 PUFFS INTO THE LUNGS TWICE DAILY 10.2 g 5   torsemide (DEMADEX) 20 MG tablet Take one tablet by mouth  every other day 45 tablet 3   VENTOLIN HFA 108 (90 Base) MCG/ACT inhaler INHALE 2 PUFFS INTO THE LUNGS EVERY 6 HOURS AS NEEDED FOR WHEEZING OR SHORTNESS OF BREATH 18 g 11   No current facility-administered medications on file prior to visit.     Review of Systems  Constitutional:  Negative for fever.  Respiratory:  Positive for wheezing. Negative for cough and shortness of breath.   Cardiovascular:  Positive for chest pain and leg swelling. Negative for palpitations.  Gastrointestinal:  Negative for abdominal pain and nausea.       No gerd  Neurological:  Positive for headaches. Negative for dizziness and light-headedness.  Psychiatric/Behavioral:  Negative for dysphoric mood. The patient is nervous/anxious.        Objective:   Vitals:   08/24/21 1034  08/24/21 1040  BP: (!) 152/74 136/82  Pulse: 61   Temp: 98.1 F (36.7 C)   SpO2: 98%    BP Readings from Last 3 Encounters:  08/24/21 136/82  08/11/21 126/70  07/27/21 (!) 151/82   Wt Readings from Last 3 Encounters:  08/24/21 206 lb (93.4 kg)  08/11/21 203 lb 6.4 oz (92.3 kg)  07/27/21 206 lb (93.4 kg)   Body mass index is 37.68 kg/m.    Physical Exam Constitutional:      General: She is not in acute distress.    Appearance: Normal appearance.  HENT:     Head: Normocephalic and atraumatic.  Eyes:     Conjunctiva/sclera: Conjunctivae normal.  Cardiovascular:     Rate and Rhythm: Normal rate and regular rhythm.     Heart sounds: Normal heart sounds. No murmur heard. Pulmonary:     Effort: Pulmonary effort is normal. No respiratory distress.     Breath sounds: Normal breath sounds. No wheezing.  Musculoskeletal:     Cervical back: Neck supple.     Right lower leg: Edema (Trace) present.     Left lower leg: Edema (Trace) present.  Lymphadenopathy:     Cervical: No cervical adenopathy.  Skin:    General: Skin is warm and dry.     Findings: No rash.  Neurological:     Mental Status: She is alert. Mental status is at baseline.  Psychiatric:        Mood and Affect: Mood normal.        Behavior: Behavior normal.        Lab Results  Component Value Date   WBC 3.6 (L) 02/21/2021   HGB 13.5 02/21/2021   HCT 41.4 02/21/2021   PLT 128.0 (L) 02/21/2021   GLUCOSE 103 (H) 02/21/2021   CHOL 137 02/21/2021   TRIG 78.0 02/21/2021   HDL 89.30 02/21/2021   LDLDIRECT 126.2 12/07/2010   LDLCALC 32 02/21/2021   ALT 14 02/21/2021   AST 15 02/21/2021   NA 138 02/21/2021   K 4.0 02/21/2021   CL 101 02/21/2021   CREATININE 1.25 (H) 02/21/2021   BUN 22 02/21/2021   CO2 28 02/21/2021   TSH 2.49 10/20/2020   INR 0.95 07/22/2017   HGBA1C 6.6 (H) 02/21/2021   MICROALBUR 1.2 02/21/2021     Assessment & Plan:    See Problem List for Assessment and Plan of chronic  medical problems.

## 2021-08-24 ENCOUNTER — Ambulatory Visit (INDEPENDENT_AMBULATORY_CARE_PROVIDER_SITE_OTHER): Payer: Medicare Other | Admitting: Internal Medicine

## 2021-08-24 ENCOUNTER — Encounter: Payer: Self-pay | Admitting: Internal Medicine

## 2021-08-24 DIAGNOSIS — M5481 Occipital neuralgia: Secondary | ICD-10-CM

## 2021-08-24 DIAGNOSIS — R040 Epistaxis: Secondary | ICD-10-CM

## 2021-08-24 DIAGNOSIS — M797 Fibromyalgia: Secondary | ICD-10-CM | POA: Diagnosis not present

## 2021-08-24 DIAGNOSIS — I10 Essential (primary) hypertension: Secondary | ICD-10-CM

## 2021-08-24 DIAGNOSIS — G43709 Chronic migraine without aura, not intractable, without status migrainosus: Secondary | ICD-10-CM

## 2021-08-24 DIAGNOSIS — N1831 Chronic kidney disease, stage 3a: Secondary | ICD-10-CM

## 2021-08-24 DIAGNOSIS — E1142 Type 2 diabetes mellitus with diabetic polyneuropathy: Secondary | ICD-10-CM | POA: Diagnosis not present

## 2021-08-24 DIAGNOSIS — G629 Polyneuropathy, unspecified: Secondary | ICD-10-CM

## 2021-08-24 DIAGNOSIS — J4530 Mild persistent asthma, uncomplicated: Secondary | ICD-10-CM | POA: Diagnosis not present

## 2021-08-24 DIAGNOSIS — I5032 Chronic diastolic (congestive) heart failure: Secondary | ICD-10-CM

## 2021-08-24 DIAGNOSIS — E782 Mixed hyperlipidemia: Secondary | ICD-10-CM

## 2021-08-24 DIAGNOSIS — M069 Rheumatoid arthritis, unspecified: Secondary | ICD-10-CM

## 2021-08-24 DIAGNOSIS — Z794 Long term (current) use of insulin: Secondary | ICD-10-CM

## 2021-08-24 DIAGNOSIS — I2583 Coronary atherosclerosis due to lipid rich plaque: Secondary | ICD-10-CM

## 2021-08-24 DIAGNOSIS — I2510 Atherosclerotic heart disease of native coronary artery without angina pectoris: Secondary | ICD-10-CM

## 2021-08-24 LAB — CBC WITH DIFFERENTIAL/PLATELET
Basophils Absolute: 0 10*3/uL (ref 0.0–0.1)
Basophils Relative: 0.7 % (ref 0.0–3.0)
Eosinophils Absolute: 0.2 10*3/uL (ref 0.0–0.7)
Eosinophils Relative: 5.3 % — ABNORMAL HIGH (ref 0.0–5.0)
HCT: 39.5 % (ref 36.0–46.0)
Hemoglobin: 13.3 g/dL (ref 12.0–15.0)
Lymphocytes Relative: 37.2 % (ref 12.0–46.0)
Lymphs Abs: 1.1 10*3/uL (ref 0.7–4.0)
MCHC: 33.6 g/dL (ref 30.0–36.0)
MCV: 85.4 fl (ref 78.0–100.0)
Monocytes Absolute: 0.3 10*3/uL (ref 0.1–1.0)
Monocytes Relative: 10.5 % (ref 3.0–12.0)
Neutro Abs: 1.4 10*3/uL (ref 1.4–7.7)
Neutrophils Relative %: 46.3 % (ref 43.0–77.0)
Platelets: 120 10*3/uL — ABNORMAL LOW (ref 150.0–400.0)
RBC: 4.62 Mil/uL (ref 3.87–5.11)
RDW: 15.3 % (ref 11.5–15.5)
WBC: 3.1 10*3/uL — ABNORMAL LOW (ref 4.0–10.5)

## 2021-08-24 LAB — COMPREHENSIVE METABOLIC PANEL
ALT: 14 U/L (ref 0–35)
AST: 17 U/L (ref 0–37)
Albumin: 4.2 g/dL (ref 3.5–5.2)
Alkaline Phosphatase: 51 U/L (ref 39–117)
BUN: 17 mg/dL (ref 6–23)
CO2: 31 mEq/L (ref 19–32)
Calcium: 9.1 mg/dL (ref 8.4–10.5)
Chloride: 104 mEq/L (ref 96–112)
Creatinine, Ser: 1.1 mg/dL (ref 0.40–1.20)
GFR: 47.86 mL/min — ABNORMAL LOW (ref >60.00)
Glucose, Bld: 100 mg/dL — ABNORMAL HIGH (ref 70–99)
Potassium: 4.3 mEq/L (ref 3.5–5.1)
Sodium: 141 mEq/L (ref 135–145)
Total Bilirubin: 0.5 mg/dL (ref 0.2–1.2)
Total Protein: 6.8 g/dL (ref 6.0–8.3)

## 2021-08-24 LAB — LIPID PANEL
Cholesterol: 154 mg/dL (ref 0–200)
HDL: 85.1 mg/dL (ref >39.00)
LDL Cholesterol: 50 mg/dL (ref 0–99)
NonHDL: 69.39
Total CHOL/HDL Ratio: 2
Triglycerides: 95 mg/dL (ref 0.0–149.0)
VLDL: 19 mg/dL (ref 0.0–40.0)

## 2021-08-24 LAB — HEMOGLOBIN A1C: Hgb A1c MFr Bld: 6.5 % (ref 4.6–6.5)

## 2021-08-24 NOTE — Assessment & Plan Note (Signed)
Chronic Secondary to cervical spine stenosis Following with neurology On Lyrica 75 mg 3 times daily and Cymbalta 30 mg daily

## 2021-08-24 NOTE — Assessment & Plan Note (Signed)
Chronic CMP 

## 2021-08-24 NOTE — Assessment & Plan Note (Signed)
Chronic Management per Dr. Dossie Der

## 2021-08-24 NOTE — Assessment & Plan Note (Signed)
S/p cauterization Using humidifier, saline, vaseline

## 2021-08-24 NOTE — Assessment & Plan Note (Signed)
Chronic Following with neuro Taking nurtec as needed Not currently aimovig Overall controlled

## 2021-08-24 NOTE — Assessment & Plan Note (Signed)
Chronic Regular exercise and healthy diet encouraged Check lipid panel  Continue Repatha

## 2021-08-24 NOTE — Assessment & Plan Note (Signed)
Chronic Appears euvolemic Following with cardiology Continue Farxiga 10 mg daily

## 2021-08-24 NOTE — Assessment & Plan Note (Addendum)
Chronic Controlled Continue Lyrica 75 mg 3 times daily-also taking for occipital neuralgia Also on Cymbalta 30 mg daily

## 2021-08-24 NOTE — Assessment & Plan Note (Addendum)
Chronic Continue Symbicort 2 puffs twice daily, albuterol as needed Having some wheezing Controlled

## 2021-08-24 NOTE — Assessment & Plan Note (Signed)
Chronic Blood pressure well controlled CMP Continue Imdur 90 mg daily, losartan 25 mg daily, Bystolic 10 mg daily

## 2021-08-24 NOTE — Assessment & Plan Note (Signed)
Chronic  Lab Results  Component Value Date   HGBA1C 6.6 (H) 02/21/2021   Sugars well controlled Check A1c Continue Farxiga 10 mg daily Stressed regular exercise, diabetic diet

## 2021-08-24 NOTE — Assessment & Plan Note (Signed)
Chronic Controlled Continue Lyrica 75 mg 3 times daily

## 2021-08-28 DIAGNOSIS — Z794 Long term (current) use of insulin: Secondary | ICD-10-CM

## 2021-08-28 DIAGNOSIS — E1142 Type 2 diabetes mellitus with diabetic polyneuropathy: Secondary | ICD-10-CM | POA: Diagnosis not present

## 2021-08-28 DIAGNOSIS — I10 Essential (primary) hypertension: Secondary | ICD-10-CM

## 2021-09-06 ENCOUNTER — Other Ambulatory Visit: Payer: Self-pay

## 2021-09-06 MED ORDER — LOSARTAN POTASSIUM 25 MG PO TABS: ORAL_TABLET | ORAL | 2 refills | Status: DC

## 2021-09-15 ENCOUNTER — Other Ambulatory Visit: Payer: Self-pay

## 2021-09-15 MED ORDER — TORSEMIDE 20 MG PO TABS
ORAL_TABLET | ORAL | 2 refills | Status: DC
Start: 2021-09-15 — End: 2022-12-12

## 2021-09-22 ENCOUNTER — Ambulatory Visit: Payer: Medicare Other | Admitting: Gastroenterology

## 2021-09-29 ENCOUNTER — Encounter (HOSPITAL_COMMUNITY): Payer: Self-pay | Admitting: Emergency Medicine

## 2021-09-29 ENCOUNTER — Emergency Department (HOSPITAL_COMMUNITY)
Admission: EM | Admit: 2021-09-29 | Discharge: 2021-09-29 | Disposition: A | Discharge status: Home / Self Care | Payer: Medicare Other | Attending: Emergency Medicine | Admitting: Emergency Medicine

## 2021-09-29 ENCOUNTER — Other Ambulatory Visit: Payer: Self-pay

## 2021-09-29 DIAGNOSIS — R6889 Other general symptoms and signs: Secondary | ICD-10-CM | POA: Diagnosis not present

## 2021-09-29 DIAGNOSIS — R58 Hemorrhage, not elsewhere classified: Secondary | ICD-10-CM | POA: Diagnosis not present

## 2021-09-29 DIAGNOSIS — Z743 Need for continuous supervision: Secondary | ICD-10-CM | POA: Diagnosis not present

## 2021-09-29 DIAGNOSIS — R040 Epistaxis: Secondary | ICD-10-CM | POA: Insufficient documentation

## 2021-09-29 LAB — CBC
HCT: 39.7 % (ref 36.0–46.0)
Hemoglobin: 12.9 g/dL (ref 12.0–15.0)
MCH: 28.4 pg (ref 26.0–34.0)
MCHC: 32.5 g/dL (ref 30.0–36.0)
MCV: 87.4 fL (ref 80.0–100.0)
Platelets: 124 10*3/uL — ABNORMAL LOW (ref 150–400)
RBC: 4.54 MIL/uL (ref 3.87–5.11)
RDW: 15 % (ref 11.5–15.5)
WBC: 3.5 10*3/uL — ABNORMAL LOW (ref 4.0–10.5)
nRBC: 0 % (ref 0.0–0.2)

## 2021-09-29 LAB — BASIC METABOLIC PANEL
Anion gap: 8 (ref 5–15)
BUN: 21 mg/dL (ref 8–23)
CO2: 26 mmol/L (ref 22–32)
Calcium: 9.2 mg/dL (ref 8.9–10.3)
Chloride: 107 mmol/L (ref 98–111)
Creatinine, Ser: 1.14 mg/dL — ABNORMAL HIGH (ref 0.44–1.00)
GFR, Estimated: 49 mL/min — ABNORMAL LOW (ref >60)
Glucose, Bld: 98 mg/dL (ref 70–99)
Potassium: 4.3 mmol/L (ref 3.5–5.1)
Sodium: 141 mmol/L (ref 135–145)

## 2021-09-29 NOTE — ED Provider Notes (Signed)
[facility] DEPT Provider Note   CSN: [id number] Arrival date & time: 09/29/21  1624     History Chief Complaint  Patient presents with   Epistaxis    HPI Samantha [name] is a 81 y.o. female presenting for epistaxis.  Patient endorses a history of similar.  Detailed medical history below.  History of diabetes, hyperlipidemia, asthma, hypertension, epistaxis in the past, complex outpatient medication regimens presenting with a chief complaint of epistaxis.  Patient states that she has had 2 hours of bright red blood per her left naris.  History of multiple cauterizations at this location.  She is on Plavix. She denies fevers or chills nausea vomiting syncope shortness of breath.  She does have a little bit of lightheadedness.  Bleeding is now controlled after 2 hours of administration of Afrin and pressure to her upper naris. Patient's recorded medical, surgical, social, medication list and allergies were reviewed in the Snapshot window as part of the initial history.   Review of Systems   Review of Systems  Constitutional:  Negative for chills and fever.  HENT:  Negative for ear pain and sore throat.   Eyes:  Negative for pain and visual disturbance.  Respiratory:  Negative for cough and shortness of breath.   Cardiovascular:  Negative for chest pain and palpitations.  Gastrointestinal:  Negative for abdominal pain and vomiting.  Genitourinary:  Negative for dysuria and hematuria.  Musculoskeletal:  Negative for arthralgias and back pain.  Skin:  Negative for color change and rash.  Neurological:  Negative for seizures and syncope.  All other systems reviewed and are negative.   Physical Exam Updated Vital Signs BP (!) 141/80   Pulse (!) 58   Temp 98 F (36.7 C) (Oral)   Resp 20   SpO2 98%  Physical Exam Vitals and nursing note reviewed.  Constitutional:      General: She is not in acute distress.    Appearance: She is well-developed.   HENT:     Head: Normocephalic and atraumatic.     Nose:     Comments: No visible bleeding, no visible blood clot Eyes:     Conjunctiva/sclera: Conjunctivae normal.  Cardiovascular:     Rate and Rhythm: Normal rate and regular rhythm.     Heart sounds: No murmur heard. Pulmonary:     Effort: Pulmonary effort is normal. No respiratory distress.     Breath sounds: Normal breath sounds.  Abdominal:     General: There is no distension.     Palpations: Abdomen is soft.     Tenderness: There is no abdominal tenderness. There is no right CVA tenderness or left CVA tenderness.  Musculoskeletal:        General: No swelling or tenderness. Normal range of motion.     Cervical back: Neck supple.  Skin:    General: Skin is warm and dry.  Neurological:     General: No focal deficit present.     Mental Status: She is alert and oriented to person, place, and time. Mental status is at baseline.     Cranial Nerves: No cranial nerve deficit.      ED Course/ Medical Decision Making/ A&P    Procedures Procedures   Medications Ordered in ED Medications - No data to display  Medical Decision Making:    Samantha [name] is a 81 y.o. female who presented to the ED today with epistaxis detailed above.     Patient's presentation is complicated by  their history of multiple comorbid medical problems on chronic outpatient medication regimens.  Patient placed on continuous vitals and telemetry monitoring while in ED which was reviewed periodically.   Complete initial physical exam performed, notably the patient  was hemodynamically stable in no acute distress.  Dried blood appreciated to left naris, no active bleeding appreciated.      Reviewed and confirmed nursing documentation for past medical history, family history, social history.    Initial Assessment:   Patient is presenting for epistaxis, recurrent.  She has a history of cautery X.3.  She feels that she has already controlled her  symptoms with anterior nasal pressure. While I was in the room, patient spat up a large amount of clotted blood.  She states she feels significantly improved.  She does endorse some lightheadedness.  Labs evaluated to evaluate for critical anemia, ongoing posterior sphenopalatine arterial bleed.  No evidence of ongoing bleeding after 2 hours of observation in the emergency department.  Labs without evidence of critical anemia and patient overall well-appearing. We will refer patient back to ENT in the outpatient setting and recommend close outpatient care and follow-up.  Patient stable for discharge at this time with strict return precautions. Clinical Impression:  1. Epistaxis      Discharge   Final Clinical Impression(s) / ED Diagnoses Final diagnoses:  Epistaxis    Rx / DC Orders ED Discharge Orders          Ordered    Ambulatory referral to ENT        09/29/21 Donny Pique, MD 09/29/21 1814

## 2021-09-29 NOTE — ED Triage Notes (Signed)
Pt reports hx of multiple nosebleeds with cauterization to right nare. States they "don't seem to be lasting" Nosebleed today and tried afrin a few times without relief.  Pt takes plavix. No pain

## 2021-09-29 NOTE — Discharge Instructions (Addendum)
You were seen today for recurrent nosebleed.  You have already controlled your symptoms by using Afrin and anterior pressure.  We reviewed your labs and do not have any anemia.  You used to be followed by ENT for these concerns but have been lost to follow-up.  I have referred you back to ENT for recurrent evaluation in the outpatient setting.  You may need recurrent cautery if this condition bothers you frequently.  Please follow-up closely using the referral and call the attached number to obtain your follow-up.  Thank you for the opportunity to participate in your care, return with any worsening symptoms.  Tretha Sciara MD.

## 2021-10-02 ENCOUNTER — Other Ambulatory Visit: Payer: Self-pay | Admitting: Internal Medicine

## 2021-10-07 ENCOUNTER — Encounter: Payer: Self-pay | Admitting: Internal Medicine

## 2021-10-07 NOTE — Patient Instructions (Signed)
       Medications changes include :   none    A referral was ordered for ENT.     Someone from that office will call you to schedule an appointment.    Return for follow up as scheduled.

## 2021-10-07 NOTE — Progress Notes (Unsigned)
Subjective:    Patient ID: Samantha [name], female    DOB: Jul 10, 1940, 81 y.o.   MRN: [id number]     HPI Yani is here for follow up from the ED.  ED 09/29/21 for epistaxis.   She has had epistaxis in the past and has had multiple cauterizations.  She is on plavix.  At home she had two hrs of bright red blood from her left nare.  She had a little lightheadedness.  She denied SOB, fever,chills, nausea, coming and syncope.  While in the ED she received Afrin and had two hours of pressure to her upper left naris and this stopped the bleeding.  Labs did not show anemia.    No bleeding since the ED.  She has been doing vaseline in the nose at night and uses the saline nasal spray.  She has a humidifier in her bedroom.   Posterior headaches every morning.  Has been to [facility].  She has had a Ct scan and epidural, which did not help.  Pain is more severe until 12 and then improves, but turning her head to the right causes significant pain.    Bp at home 8/29 was lower than usual-67/47 initially - by end of day 127/71.  High BP 153/87 a few days ago.  She is unsure why her blood pressure was so low that 1 day.  Left ear amplifies sound.  Can feel heart beating in ear.  she wonders if this is related to her neck issues and headaches  Sugars have been good at home.      Medications and allergies reviewed with patient and updated if appropriate.  Current Outpatient Medications on File Prior to Visit  Medication Sig Dispense Refill   acetaminophen (TYLENOL) 500 MG tablet Take 500 mg by mouth every 6 (six) hours as needed for mild pain.      albuterol (PROVENTIL) (2.5 MG/3ML) 0.083% nebulizer solution Take 3 mLs (2.5 mg total) by nebulization every 6 (six) hours as needed for wheezing or shortness of breath. 150 mL 0   Calcium-Magnesium-Zinc (CAL-MAG-ZINC PO) Take 1 tablet by mouth daily.     cetirizine (ZYRTEC) 10 MG tablet TAKE 1 TABLET(10 MG) BY MOUTH DAILY 90 tablet 3    cholecalciferol (VITAMIN D) 1000 UNITS tablet Take 1,000 Units by mouth every morning.      clopidogrel (PLAVIX) 75 MG tablet TAKE 1 TABLET(75 MG) BY MOUTH DAILY 90 tablet 1   DULoxetine (CYMBALTA) 30 MG capsule Take 1 capsule (30 mg total) by mouth daily. 90 capsule 3   Erenumab-aooe (AIMOVIG) 70 MG/ML SOAJ Inject 70 mg into the skin every 30 (thirty) days. 1 mL 11   Evolocumab (REPATHA SURECLICK) 774 MG/ML SOAJ INJECT 1 PEN UNDER THE SKIN EVERY 14 DAYS 6 mL 3   famotidine (PEPCID) 40 MG tablet TAKE 1 TABLET(40 MG) BY MOUTH DAILY AS NEEDED FOR HEARTBURN OR INDIGESTION 30 tablet 5   FARXIGA 10 MG TABS tablet TAKE 1 TABLET(10 MG) BY MOUTH DAILY BEFORE BREAKFAST 30 tablet 5   glucose blood test strip Use as instructed 100 each 12   hydroxychloroquine (PLAQUENIL) 200 MG tablet Take 400 mg by mouth daily.      isosorbide mononitrate (IMDUR) 30 MG 24 hr tablet Take 3 tablets (90 mg total) by mouth daily. 270 tablet 2   Lancets (ONETOUCH ULTRASOFT) lancets Use as instructed 100 each 12   losartan (COZAAR) 25 MG tablet TAKE 1 TABLET(25 MG) BY MOUTH  DAILY 90 tablet 2   nebivolol (BYSTOLIC) 10 MG tablet TAKE 1 TABLET(10 MG) BY MOUTH DAILY 90 tablet 2   nitroGLYCERIN (NITROSTAT) 0.4 MG SL tablet DISSOLVE 1 TABLET UNDER THE TONGUE EVERY 5 MINUTES AS NEEDED FOR CHEST PAIN 25 tablet 7   Polyethyl Glycol-Propyl Glycol (SYSTANE) 0.4-0.3 % GEL ophthalmic gel Place 1 application into both eyes daily as needed (for dry eyes).      pregabalin (LYRICA) 75 MG capsule Take 1 capsule (75 mg total) by mouth 3 (three) times daily. 90 capsule 5   psyllium (METAMUCIL) 58.6 % powder Take 1 packet by mouth daily.     ranolazine (RANEXA) 500 MG 12 hr tablet Take 1 tablet (500 mg total) by mouth 2 (two) times daily. 180 tablet 3   Rimegepant Sulfate (NURTEC) 75 MG TBDP Take 1 tab at onset of migraine.  May repeat in 2 hrs, if needed.  Max dose: 2 tabs/day. This is a 30 day prescription. 12 tablet 11   SYMBICORT 80-4.5 MCG/ACT  inhaler INHALE 2 PUFFS INTO THE LUNGS TWICE DAILY 10.2 g 5   torsemide (DEMADEX) 20 MG tablet Take one tablet by mouth every other day 45 tablet 2   VENTOLIN HFA 108 (90 Base) MCG/ACT inhaler INHALE 2 PUFFS INTO THE LUNGS EVERY 6 HOURS AS NEEDED FOR WHEEZING OR SHORTNESS OF BREATH 18 g 11   No current facility-administered medications on file prior to visit.     Review of Systems  Constitutional:  Negative for fever.  Respiratory:  Positive for shortness of breath (occ) and wheezing (occ - asthma). Negative for cough.   Cardiovascular:  Positive for chest pain (a little discomfort at times) and leg swelling (ankle - mild - chronic). Negative for palpitations.  Neurological:  Positive for dizziness (occ) and headaches.       Objective:   Vitals:   10/09/21 0918 10/09/21 1006  BP: (!) 140/76 130/68  Pulse: 62   Temp: 98.3 F (36.8 C)   SpO2: 99%    BP Readings from Last 3 Encounters:  10/09/21 130/68  09/29/21 (!) 141/80  08/24/21 136/82   Wt Readings from Last 3 Encounters:  10/09/21 205 lb (93 kg)  08/24/21 206 lb (93.4 kg)  08/11/21 203 lb 6.4 oz (92.3 kg)   Body mass index is 37.49 kg/m.    Physical Exam Constitutional:      General: She is not in acute distress.    Appearance: Normal appearance.  HENT:     Head: Normocephalic and atraumatic.     Nose:     Comments: No active bleeding Eyes:     Conjunctiva/sclera: Conjunctivae normal.  Cardiovascular:     Rate and Rhythm: Normal rate and regular rhythm.     Heart sounds: Normal heart sounds. No murmur heard. Pulmonary:     Effort: Pulmonary effort is normal. No respiratory distress.     Breath sounds: Normal breath sounds. No wheezing.  Musculoskeletal:     Cervical back: Neck supple.     Right lower leg: No edema.     Left lower leg: No edema.  Lymphadenopathy:     Cervical: No cervical adenopathy.  Skin:    General: Skin is warm and dry.     Findings: No rash.  Neurological:     Mental Status: She  is alert. Mental status is at baseline.  Psychiatric:        Mood and Affect: Mood normal.        Behavior: Behavior  normal.        Lab Results  Component Value Date   WBC 3.5 (L) 09/29/2021   HGB 12.9 09/29/2021   HCT 39.7 09/29/2021   PLT 124 (L) 09/29/2021   GLUCOSE 98 09/29/2021   CHOL 154 08/24/2021   TRIG 95.0 08/24/2021   HDL 85.10 08/24/2021   LDLDIRECT 126.2 12/07/2010   LDLCALC 50 08/24/2021   ALT 14 08/24/2021   AST 17 08/24/2021   NA 141 09/29/2021   K 4.3 09/29/2021   CL 107 09/29/2021   CREATININE 1.14 (H) 09/29/2021   BUN 21 09/29/2021   CO2 26 09/29/2021   TSH 2.49 10/20/2020   INR 0.95 07/22/2017   HGBA1C 6.5 08/24/2021   MICROALBUR 1.2 02/21/2021     Assessment & Plan:     See Problem List for Assessment and Plan of chronic medical problems.

## 2021-10-09 ENCOUNTER — Ambulatory Visit (INDEPENDENT_AMBULATORY_CARE_PROVIDER_SITE_OTHER): Payer: Medicare Other | Admitting: Internal Medicine

## 2021-10-09 VITALS — BP 130/68 | HR 62 | Temp 98.3°F | Ht 62.0 in | Wt 205.0 lb

## 2021-10-09 DIAGNOSIS — I10 Essential (primary) hypertension: Secondary | ICD-10-CM | POA: Diagnosis not present

## 2021-10-09 DIAGNOSIS — I2510 Atherosclerotic heart disease of native coronary artery without angina pectoris: Secondary | ICD-10-CM

## 2021-10-09 DIAGNOSIS — I2583 Coronary atherosclerosis due to lipid rich plaque: Secondary | ICD-10-CM | POA: Diagnosis not present

## 2021-10-09 DIAGNOSIS — E119 Type 2 diabetes mellitus without complications: Secondary | ICD-10-CM

## 2021-10-09 DIAGNOSIS — G4486 Cervicogenic headache: Secondary | ICD-10-CM

## 2021-10-09 DIAGNOSIS — R040 Epistaxis: Secondary | ICD-10-CM

## 2021-10-09 NOTE — Assessment & Plan Note (Addendum)
Chronic On left side  Has seen ortho at [facility], Dr Krista Blue Has significant cervial spine disease causing pain - had epidural which did not help On Lyrica, which is helping Also on Cymbalta 30 mg daily which is helping

## 2021-10-09 NOTE — Assessment & Plan Note (Signed)
Recurrent Recently emergency room for left nasal bleeding-has been cauterized in the past No recurrent bleeding since being in the emergency room She is applying Vaseline at night, using saline nasal spray and has a humidifier in her room at night-advised to continue doing this on a daily basis Referral ordered for ENT for follow-up-most likely will not need treatment, but they can make sure everything looks okay and she can follow-up on her ear symptoms

## 2021-10-09 NOTE — Assessment & Plan Note (Signed)
Chronic Initial blood pressure was slightly elevated, but very good on repeat Blood pressure at home has been overall controlled, however she did have 1 day to the BP was much lower than usual-she is not sure why this was and that has not recurred Continue to monitor BP regularly at home No change in medications today-continue Imdur 90 mg daily, losartan 25 mg daily, Bystolic 10 mg daily

## 2021-10-09 NOTE — Assessment & Plan Note (Signed)
Chronic Sugars are well controlled at home Lab Results  Component Value Date   HGBA1C 6.5 08/24/2021   Sugars have been controlled Continue Farxiga 10 mg daily

## 2021-10-16 ENCOUNTER — Other Ambulatory Visit: Payer: Self-pay | Admitting: Neurology

## 2021-10-16 NOTE — Telephone Encounter (Signed)
Verify Drug Registry For pregabalin 75 mg capsule Last Filled: 08/30/2021 Quantity: 90 capsules for 30 days Last appointment: 07/27/2021 Next appointment: 02/15/2022

## 2021-10-17 ENCOUNTER — Ambulatory Visit (INDEPENDENT_AMBULATORY_CARE_PROVIDER_SITE_OTHER): Payer: Medicare Other

## 2021-10-17 ENCOUNTER — Ambulatory Visit (INDEPENDENT_AMBULATORY_CARE_PROVIDER_SITE_OTHER): Payer: Medicare Other | Admitting: Orthopaedic Surgery

## 2021-10-17 ENCOUNTER — Encounter: Payer: Self-pay | Admitting: Orthopaedic Surgery

## 2021-10-17 VITALS — BP 136/87 | HR 66 | Ht 62.0 in | Wt 205.0 lb

## 2021-10-17 DIAGNOSIS — M79672 Pain in left foot: Secondary | ICD-10-CM

## 2021-10-17 DIAGNOSIS — I2510 Atherosclerotic heart disease of native coronary artery without angina pectoris: Secondary | ICD-10-CM

## 2021-10-17 DIAGNOSIS — I2583 Coronary atherosclerosis due to lipid rich plaque: Secondary | ICD-10-CM | POA: Diagnosis not present

## 2021-10-18 NOTE — Progress Notes (Unsigned)
Office Visit Note   Patient: Samantha [name]           Date of Birth: [birth year]           MRN: [id number] Visit Date: 10/17/2021              Requested by: [name], MD [place],  [place] [id number] PCP: [name], MD   Assessment & Plan: Visit Diagnoses:  1. Pain in left foot     Plan: Longitudinal arch support supplied that she can wear in her left shoe.  She needs to get some cushioning shoes that has good arch support for the midfoot degenerative changes that are seen dorsally.  She is on appropriate treatment for rheumatoid arthritis and sed rate CRP in March looked great.  She can return if her foot gets worse.  We discussed [employer] for shoe wear and shoe such as a 6 are new balance that has arch buildup as well as extra cushion.  Neck gets worse and she wants to consider surgery on her neck she can return.  Follow-Up Instructions: No follow-ups on file.   Orders:  Orders Placed This Encounter  Procedures   XR Foot Complete Left   No orders of the defined types were placed in this encounter.     Procedures: No procedures performed   Clinical Data: No additional findings.   Subjective: Chief Complaint  Patient presents with   Neck - Pain, Follow-up   Left Foot - Pain    HPI 81 year old female returns with ongoing problems with neck pain.  She had a cervical epidural she has a solid fusion at see 5-6 and has some moderate stenosis at C6-7 below her solid fusion.  Her injection cervical epidural helped for 2 weeks.  She states she did not like the way it made her feel.  She has difficulty turning her head to the left.  Does not have problems turning to the right.  States she is actually been walking better but is having trouble  with pain in the bottom of her foot where she had several podiatry procedures moving some fat from the plantar surface of her foot possible osteotomy proximal she has a small wire but proximal metatarsal does  not appear to change position.  She has severe degenerative changes at first metatarsal phalangeal joint with spurring.  Some periarticular erosion suggest rheumatoid arthritis.  Less likely gout.  Review of Systems history of TIA total knee arthroplasty in the right cervical fusion C5-6 moderate stenosis C6-7.  Metatarsalgia left foot.   Objective: Vital Signs: BP 136/87   Pulse 66   Ht '5\' 2"'$  (1.575 m)   Wt 205 lb (93 kg)   BMI 37.49 kg/m   Physical Exam Constitutional:      Appearance: She is well-developed.  HENT:     Head: Normocephalic.     Right Ear: External ear normal.     Left Ear: External ear normal. There is no impacted cerumen.  Eyes:     Pupils: Pupils are equal, round, and reactive to light.  Neck:     Thyroid: No thyromegaly.     Trachea: No tracheal deviation.  Cardiovascular:     Rate and Rhythm: Normal rate.  Pulmonary:     Effort: Pulmonary effort is normal.  Abdominal:     Palpations: Abdomen is soft.  Musculoskeletal:     Cervical back: No rigidity.  Skin:    General: Skin  is warm and dry.  Neurological:     Mental Status: She is alert and oriented to person, place, and time.  Psychiatric:        Behavior: Behavior normal.     Ortho Exam grade 2 hallux rigidus.  Tenderness over the plantar surface.  Degenerative spurs noted dorsally on the foot with degenerative x-rays corresponding on radiograph.  Healed scar dorsal foot it looks like she had some skin healing problems with skin necrosis and scarring some melanin loss.  Specialty Comments:  No specialty comments available.  Imaging: XR Foot Complete Left  Result Date: 10/18/2021 Three-view x-rays show severe degenerative changes first MTP joint with marginal erosion complete loss of joint space.  Single wire proximal first metatarsal and if there is an osteotomy at that point it is healed with normal position.  Dorsal tarsometatarsal degenerative changes with some cystic changes second third  and fourth metatarsal tarsal joints. Impression: Degenerative changes as listed above most prominent first MTP joint.    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 migrainosus, not intractable 04/17/2021   Urinary frequency 02/21/2021   CKD (chronic kidney disease) stage 3, GFR 30-59 ml/min (HCC) 02/20/2021   Spinal stenosis of cervical region 02/16/2021   Rheumatoid arthritis ([place]) - Dr Dossie Der 10/20/2020   Neuropathy 10/19/2020   Vitamin D deficiency 04/13/2020   S/P TKR (total knee replacement), 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   Aortic atherosclerosis ([place]) 11/03/2019   Posterior vitreous detachment of right eye 07/15/2019   Posterior vitreous detachment of left eye 07/15/2019   Diabetes mellitus without complication ([place]) 94/17/4081   Recurrent corneal erosion, 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   Raynaud phenomenon 05/06/2019   Trigger finger, left ring finger 03/18/2019   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 01/28/2018   Sacral back pain 10/16/2017   Epistaxis 07/23/2017   Chronic left upper quadrant pain 07/23/2017   Poor balance 03/14/2017   Angina pectoris ([place]) 03/22/2016   Hypertensive heart disease 07/07/2015   Essential hypertension 07/07/2015   Lightheadedness 07/07/2015   Plantar fasciitis, left 03/22/2015   Coronary artery disease due to lipid rich plaque 01/05/2015   Chronic diastolic CHF (congestive heart failure), NYHA class 2 ([place]) 01/05/2015   Malignant neoplasm of ascending colon  pT1, pN0, rM0 s/p robotic colectomy 11/11/2014 11/11/2014   Migraine (Ocular) 01/05/2014   VBI (vertebrobasilar insufficiency) 08/22/2012   TIA (transient ischemic attack) 06/27/2012   DJD (degenerative joint disease) 02/02/2011   Varicose veins of legs 06/06/2010   Palpitations 11/23/2008   UTI'S, RECURRENT 09/28/2008    Fibromyalgia 08/15/2007   DM II (diabetes mellitus, type II), w/ neuropathy 05/21/2006   Hyperlipidemia 05/21/2006   Asthma 01/29/2002   Past Medical History:  Diagnosis Date   A-fib ([place])    Abnormal CT of the chest 2008   last CT4-l 2009:  . No f/u suggested    Allergy    Asthma    CAD (coronary artery disease)    a. Coronary CTA 10/16: Coronary Ca score 211, mod non-obstructive CAD with LM mild plaque (25-50%), mid LAD 50-69%. b. Neg nuc 06/2015.   Cataract    BILATERAL-REMOVED   Chronic diastolic CHF (congestive heart failure) (HCC)    Collagen vascular disease ([place])    "arterial sclerosis" per pt   Complication of anesthesia    trouble waking up  Fibromyalgia    GERD (gastroesophageal reflux disease)    H/O hiatal hernia    Heart murmur    Hyperlipidemia    Hypertension    Malignant neoplasm of ascending colon ([place]) 2016   Minimally invasive right hemicolectomy to be done    Neuromuscular disorder (HCC)    FIBROMYALGIA   Ocular migraine    OSA (obstructive sleep apnea) 09/2007   dx w/ a sleep study, not on  CPAP   Osteoarthritis    Osteoporosis    Pneumonia    "double" in 2004   PONV (postoperative nausea and vomiting)    Reactive airway disease 01/29/2002   dx of pseudoasthma / vcd in 2005 and nl sprirometry History of dyspnea, 2011,  improved after several medications were changed around Question of COPD, disproved July 06, 2009 with nl pft's       Rheumatoid factor positive    Shingles 11/2009   Sleep apnea    Stroke ([place])    TIA (transient ischemic attack)    x2 - on Plavix for this   Torn rotator cuff    right worse than left, both are torn   Tumor, 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   Type II diabetes mellitus ([place])    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   Vaginal dysplasia     Family History  Problem Relation Age of Onset   Heart disease Father    Heart disease Mother    Lung cancer Mother    Allergies Sister    Parkinsonism Sister        possible    Asthma Sister    Asthma Paternal Grandmother    Stroke Paternal Grandmother    Heart disease Other        paternal grandparents, maternal grandparents,    Heart disease Brother    Emphysema Brother    Aneurysm Brother        x3   Kidney failure Brother    Diabetes Brother    Diabetes Brother    Stroke Brother    Stroke Maternal Grandmother    Breast cancer Neg Hx    Colon cancer Neg Hx    Heart attack Neg Hx    Esophageal cancer Neg Hx    Rectal cancer Neg Hx    Stomach cancer Neg Hx     Past Surgical History:  Procedure Laterality Date   ABDOMINAL HYSTERECTOMY  1980   NO oophorectomy per pt    ANTERIOR CERVICAL DECOMP/DISCECTOMY FUSION  2001   C 3, C4 and C5 plate and screws   BREAST BIOPSY Right 1999   BUNIONECTOMY Left ~ [place]     2018 By Dr. Pernell Dupre (done after colon surgery)   CATARACT EXTRACTION W/ INTRAOCULAR LENS  IMPLANT, BILATERAL  2012   COLON SURGERY  10/2014   EYE SURGERY Bilateral    torq lens for cataracts   LEFT HEART CATH AND CORONARY ANGIOGRAPHY N/A 03/22/2016   Procedure: Left Heart Cath and Coronary Angiography;  Surgeon: Belva Crome, MD;  Location: [facility];  Service: Cardiovascular;  Laterality: N/A;   LUMBAR LAMINECTOMY/DECOMPRESSION MICRODISCECTOMY N/A 01/20/2018   Procedure: L4-5 decompression;  Surgeon: Marybelle Killings, MD;  Location: [facility];  Service: Orthopedics;  Laterality: N/A;   THYROIDECTOMY, PARTIAL  1960's   TOTAL KNEE ARTHROPLASTY Right 12/04/2019   Procedure: RIGHT TOTAL KNEE ARTHROPLASTY;  Surgeon: Marybelle Killings, MD;  Location: [facility];  Service:  Orthopedics;  Laterality: Right;  RNFA APRIL [place]   "Laser surgery for vaginal cancer; followed by chemotherapy" (06/27/2012)   Social History   Occupational History   Occupation: Retired, disable since 2000    Employer: RETIRED  Tobacco Use   Smoking status: Former    Packs/day: 0.25    Years: 5.00    Total pack  years: 1.25    Types: Cigarettes    Quit date: 01/29/1998    Years since quitting: 23.7   Smokeless tobacco: Never   Tobacco comments:    Quit in 2001  Vaping Use   Vaping Use: Never used  Substance and Sexual Activity   Alcohol use: No    Alcohol/week: 0.0 standard drinks of alcohol   Drug use: No   Sexual activity: Never

## 2021-10-19 ENCOUNTER — Ambulatory Visit (INDEPENDENT_AMBULATORY_CARE_PROVIDER_SITE_OTHER): Payer: Medicare Other | Admitting: Gastroenterology

## 2021-10-19 ENCOUNTER — Encounter: Payer: Self-pay | Admitting: Gastroenterology

## 2021-10-19 VITALS — BP 116/66 | HR 71 | Ht 62.0 in | Wt 210.0 lb

## 2021-10-19 DIAGNOSIS — Z85038 Personal history of other malignant neoplasm of large intestine: Secondary | ICD-10-CM

## 2021-10-19 DIAGNOSIS — I2583 Coronary atherosclerosis due to lipid rich plaque: Secondary | ICD-10-CM

## 2021-10-19 DIAGNOSIS — K648 Other hemorrhoids: Secondary | ICD-10-CM | POA: Diagnosis not present

## 2021-10-19 DIAGNOSIS — I2510 Atherosclerotic heart disease of native coronary artery without angina pectoris: Secondary | ICD-10-CM

## 2021-10-19 DIAGNOSIS — R159 Full incontinence of feces: Secondary | ICD-10-CM

## 2021-10-19 NOTE — Patient Instructions (Addendum)
If you are age 79 or older, your body mass index should be between 23-30. Your Body mass index is 38.41 kg/m. If this is out of the aforementioned range listed, please consider follow up with your Primary Care Provider.  If you are age 80 or younger, your body mass index should be between 19-25. Your Body mass index is 38.41 kg/m. If this is out of the aformentioned range listed, please consider follow up with your Primary Care Provider.   You have been scheduled for a Hemorrhoid banding 11/01/21 @ 3:40pm  We have sent a referral to Physical Therapy and they will contact you to schedule an appointment.   The [facility] providers would like to encourage you to use [facility] to communicate with providers for non-urgent requests or questions.  Due to long hold times on the telephone, sending your provider a message by [facility] may be a faster and more efficient way to get a response.  Please allow 48 business hours for a response.  Please remember that this is for non-urgent requests.   It was a pleasure to see you today!  Thank you for trusting me with your gastrointestinal care!    Scott E.Cunningham,MD

## 2021-10-19 NOTE — Progress Notes (Signed)
HPI : Samantha [name] is a very pleasant 81 year old female with a history of colon cancer, CAD, DM, asthma, CKD IBS and fibromyalgia who is referred to [place] by Dr. Billey Gosling for further evaluation and management of persistent hemorr.  The patient has had problems with bleeding from hemorrhoids for many years, but she says the bleeding has been much more frequent in the past year.  She is seeing blood with almost every bowel movement over the past year.  The blood is bright red and mostly on the toilet paper, sometimes dripping from the anus.  She states that these bleeding symptoms are the same as what she has seen in the past, just more frequent. She denies pain with the passage of stool and denies symptoms of perianal pain or itching.  She sometimes feels prolapsing hemorrhoids.  She reports having daily bowel movements, usually 2-3 per day.  Stool is soft and formed and she denies any problems with straining or hard stools. She does have problems with incontinence, manifested as passing stool each time she urinates, as well as passing variable amounts of stool in the absence of an urge to defecate. She takes Cholestyramine and metamucil each about 3x/week to control her diarrhea and improve stool bulk and reduce urgency.  Her last colonoscopy was in Mar 2021 and was normal.  Review of pertinent gastrointestinal problems: 1. Colon cancer; colonoscopy [name] 7 2016 done for minor rectal bleeding found a small descending colon firm polypoid lesion. This was resected with snare cautery and confirmed adenocarcinoma. She underwent robot-assisted right colectomy with [name] October 2016 and the pathologic specimen prove that there was no residual adenocarcinoma. This was a T1, N0 M0 right-sided colon cancer.  Colonoscopy January 2018 normal right sided anastomosis, single 3 mm polyp was found and removed.  It was adenomatous on biopsy.  She was recommended to have repeat colonoscopy at 3-year  interval. Colonoscopy Mar 2021:  Normal ileocolonic anastomosis, no polyps, recommended to repeat colonoscopy in 5 years 2. 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   Past Medical History:  Diagnosis Date   A-fib [facility])    Abnormal CT of the chest 2008   last CT4-l 2009:  . No f/u suggested    Allergy    Asthma    CAD (coronary artery disease)    a. Coronary CTA 10/16: Coronary Ca score 211, mod non-obstructive CAD with LM mild plaque (25-50%), mid LAD 50-69%. b. Neg nuc 06/2015.   Cataract    BILATERAL-REMOVED   Chronic diastolic CHF (congestive heart failure) (HCC)    Collagen vascular disease ([place])    "arterial sclerosis" per pt   Complication of anesthesia    trouble waking up   Fibromyalgia    GERD (gastroesophageal reflux disease)    H/O hiatal hernia    Heart murmur    Hyperlipidemia    Hypertension    Malignant neoplasm of ascending colon ([place]) 2016   Minimally invasive right hemicolectomy to be done    Neuromuscular disorder ([place])    FIBROMYALGIA   Ocular migraine    OSA (obstructive sleep apnea) 09/2007   dx w/ a sleep study, not on  CPAP   Osteoarthritis    Osteoporosis    Pneumonia    "double" in 2004   PONV (postoperative nausea and vomiting)    Reactive airway disease 01/29/2002   dx of pseudoasthma / vcd in 2005 and nl sprirometry History of dyspnea, 2011,  improved after several medications were changed around Question of COPD, disproved July 06, 2009 with nl pft's       Rheumatoid factor positive    Shingles 11/2009   Sleep apnea    Stroke 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 Pernell Dupre (done after colon surgery)   CATARACT EXTRACTION W/ INTRAOCULAR LENS  IMPLANT, BILATERAL  2012   COLON SURGERY  10/2014   EYE SURGERY Bilateral    torq lens for cataracts   LEFT HEART CATH AND CORONARY ANGIOGRAPHY N/A 03/22/2016   Procedure: Left Heart Cath and Coronary Angiography;  Surgeon: Belva Crome, MD;  Location: [place] [facility];  Service: Cardiovascular;  Laterality: N/A;   LUMBAR LAMINECTOMY/DECOMPRESSION MICRODISCECTOMY N/A 01/20/2018   Procedure: L4-5 decompression;  Surgeon: Marybelle Killings, MD;  Location: [place];  Service: Orthopedics;  Laterality: N/A;   THYROIDECTOMY, PARTIAL  1960's   TOTAL KNEE ARTHROPLASTY Right 12/04/2019   Procedure: RIGHT TOTAL KNEE ARTHROPLASTY;  Surgeon: Marybelle Killings, MD;  Location: [place];  Service: Orthopedics;  Laterality: Right;  RNFA APRIL [place]   "Laser surgery for vaginal cancer; followed by chemotherapy" (06/27/2012)   Family History  Problem Relation Age of Onset   Heart disease Father    Heart disease Mother    Lung cancer Mother    Allergies Sister    Parkinsonism Sister        possible   Asthma Sister    Asthma Paternal Grandmother    Stroke Paternal Grandmother    Heart disease Other        paternal grandparents, maternal grandparents,    Heart disease Brother    Emphysema Brother    Aneurysm Brother        x3   Kidney failure Brother    Diabetes Brother    Diabetes Brother    Stroke Brother    Stroke Maternal Grandmother    Breast cancer Neg Hx    Colon cancer Neg Hx    Heart attack Neg Hx    Esophageal cancer Neg Hx    Rectal cancer Neg Hx    Stomach cancer Neg Hx    Social  History   Tobacco Use   Smoking status: Former    Packs/day: 0.25    Years: 5.00    Total pack years: 1.25    Types: Cigarettes    Quit date: 01/29/1998    Years since quitting: 23.7   Smokeless tobacco: Never   Tobacco comments:    Quit in 2001  Vaping Use   Vaping Use: Never used  Substance Use Topics   Alcohol use: No    Alcohol/week: 0.0  standard drinks of alcohol   Drug use: No   Current Outpatient Medications  Medication Sig Dispense Refill   acetaminophen (TYLENOL) 500 MG tablet Take 500 mg by mouth every 6 (six) hours as needed for mild pain.      albuterol (PROVENTIL) (2.5 MG/3ML) 0.083% nebulizer solution Take 3 mLs (2.5 mg total) by nebulization every 6 (six) hours as needed for wheezing or shortness of breath. 150 mL 0   Calcium-Magnesium-Zinc (CAL-MAG-ZINC PO) Take 1 tablet by mouth daily.     cetirizine (ZYRTEC) 10 MG tablet TAKE 1 TABLET(10 MG) BY MOUTH DAILY 90 tablet 3   cholecalciferol (VITAMIN D) 1000 UNITS tablet Take 1,000 Units by mouth every morning.      clopidogrel (PLAVIX) 75 MG tablet TAKE 1 TABLET(75 MG) BY MOUTH DAILY 90 tablet 1   DULoxetine (CYMBALTA) 30 MG capsule Take 1 capsule (30 mg total) by mouth daily. 90 capsule 3   Evolocumab (REPATHA SURECLICK) 562 MG/ML SOAJ INJECT 1 PEN UNDER THE SKIN EVERY 14 DAYS 6 mL 3   famotidine (PEPCID) 40 MG tablet TAKE 1 TABLET(40 MG) BY MOUTH DAILY AS NEEDED FOR HEARTBURN OR INDIGESTION 30 tablet 5   FARXIGA 10 MG TABS tablet TAKE 1 TABLET(10 MG) BY MOUTH DAILY BEFORE BREAKFAST 30 tablet 5   glucose blood test strip Use as instructed 100 each 12   hydroxychloroquine (PLAQUENIL) 200 MG tablet Take 400 mg by mouth daily.      isosorbide mononitrate (IMDUR) 30 MG 24 hr tablet Take 3 tablets (90 mg total) by mouth daily. 270 tablet 2   Lancets (ONETOUCH ULTRASOFT) lancets Use as instructed 100 each 12   losartan (COZAAR) 25 MG tablet TAKE 1 TABLET(25 MG) BY MOUTH DAILY 90 tablet 2   nebivolol (BYSTOLIC) 10 MG tablet  TAKE 1 TABLET(10 MG) BY MOUTH DAILY 90 tablet 2   nitroGLYCERIN (NITROSTAT) 0.4 MG SL tablet DISSOLVE 1 TABLET UNDER THE TONGUE EVERY 5 MINUTES AS NEEDED FOR CHEST PAIN 25 tablet 7   Polyethyl Glycol-Propyl Glycol (SYSTANE) 0.4-0.3 % GEL ophthalmic gel Place 1 application into both eyes daily as needed (for dry eyes).      pregabalin (LYRICA) 75 MG capsule TAKE 1 CAPSULE(75 MG) BY MOUTH THREE TIMES DAILY 90 capsule 5   psyllium (METAMUCIL) 58.6 % powder Take 1 packet by mouth daily.     ranolazine (RANEXA) 500 MG 12 hr tablet Take 1 tablet (500 mg total) by mouth 2 (two) times daily. 180 tablet 3   Rimegepant Sulfate (NURTEC) 75 MG TBDP Take 1 tab at onset of migraine.  May repeat in 2 hrs, if needed.  Max dose: 2 tabs/day. This is a 30 day prescription. 12 tablet 11   SYMBICORT 80-4.5 MCG/ACT inhaler INHALE 2 PUFFS INTO THE LUNGS TWICE DAILY 10.2 g 5   torsemide (DEMADEX) 20 MG tablet Take one tablet by mouth every other day 45 tablet 2   VENTOLIN HFA 108 (90 Base) MCG/ACT inhaler INHALE 2 PUFFS INTO THE LUNGS EVERY 6 HOURS AS NEEDED FOR WHEEZING OR SHORTNESS OF BREATH 18 g 11   Erenumab-aooe (AIMOVIG) 70 MG/ML SOAJ Inject 70 mg into the skin every 30 (thirty) days. (Patient not taking: Reported on 10/19/2021) 1 mL 11   No current facility-administered medications for this visit.   Allergies  Allergen Reactions   Bactrim [Sulfamethoxazole-Trimethoprim] Other (See Comments)    See OV 09-15-13, rash-tongue swelling due to bactrim ?   Cefuroxime Axetil Hives   Oxycodone Nausea And Vomiting  Pravastatin Other (See Comments)    "muscle breakdown" with profuse sweating   Terfenadine Hives    Other reaction(s): Other (See Comments)   Zocor [Simvastatin] Other (See Comments)    2012 "Muscle breakdown " with profuse sweating   Tramadol Nausea And Vomiting   Atorvastatin Other (See Comments)    Pt reports muscle aches and joint pains   Cefuroxime Other (See Comments)   Lactose Intolerance (Gi)  Diarrhea   Latex     blisters   Lime Flavor [Flavoring Agent] Diarrhea   Metformin And Related Diarrhea   Oxycodone Hcl Other (See Comments)   Pravastatin Sodium Other (See Comments)   Rosuvastatin Other (See Comments)    Pt reports "myalgias, fatigue, nosebleeds."   Rosuvastatin Calcium Other (See Comments)   Tape Other (See Comments)    blisters     Review of Systems: All systems reviewed and negative except where noted in HPI.    XR Foot Complete Left  Result Date: 10/18/2021 Three-view x-rays show severe degenerative changes first MTP joint with marginal erosion complete loss of joint space.  Single wire proximal first metatarsal and if there is an osteotomy at that point it is healed with normal position.  Dorsal tarsometatarsal degenerative changes with some cystic changes second third and fourth metatarsal tarsal joints. Impression: Degenerative changes as listed above most prominent first MTP joint.   Physical Exam: BP 116/66   Pulse 71   Ht '5\' 2"'$  (1.575 m)   Wt 210 lb (95.3 kg)   BMI 38.41 kg/m  Constitutional: Pleasant,well-developed, African American female in no acute distress. HEENT: Normocephalic and atraumatic. Conjunctivae are normal. No scleral icterus. Neck supple.  Cardiovascular: Normal rate, regular rhythm.  Pulmonary/chest: Effort normal and breath sounds normal. No wheezing, rales or rhonchi. Abdominal: Soft, nondistended, nontender. Bowel sounds active throughout. There are no masses palpable. No hepatomegaly. Extremities: no edema Rectal:  CMA [place] Grain present:  Small skin tag, no anal fissure present.  Digital rectal exam notable for grossly normal resting tone, but weak EAS and minimal pelvic floor descent.  No rectal mass present Neurological: Alert and oriented to person place and time. Skin: Skin is warm and dry. No rashes noted. Psychiatric: Normal mood and affect. Behavior is normal.  CBC    Component Value Date/Time   WBC 3.5 (L)  09/29/2021 1725   RBC 4.54 09/29/2021 1725   HGB 12.9 09/29/2021 1725   HGB 12.5 10/01/2018 1036   HCT 39.7 09/29/2021 1725   HCT 37.2 10/01/2018 1036   PLT 124 (L) 09/29/2021 1725   PLT 158 10/01/2018 1036   MCV 87.4 09/29/2021 1725   MCV 86 10/01/2018 1036   MCH 28.4 09/29/2021 1725   MCHC 32.5 09/29/2021 1725   RDW 15.0 09/29/2021 1725   RDW 14.7 10/01/2018 1036   LYMPHSABS 1.1 08/24/2021 1136   LYMPHSABS 1.7 10/01/2018 1036   MONOABS 0.3 08/24/2021 1136   EOSABS 0.2 08/24/2021 1136   EOSABS 0.1 10/01/2018 1036   BASOSABS 0.0 08/24/2021 1136   BASOSABS 0.0 10/01/2018 1036    CMP     Component Value Date/Time   NA 141 09/29/2021 1725   NA 141 09/20/2020 1332   K 4.3 09/29/2021 1725   CL 107 09/29/2021 1725   CO2 26 09/29/2021 1725   GLUCOSE 98 09/29/2021 1725   BUN 21 09/29/2021 1725   BUN 26 09/20/2020 1332   CREATININE 1.14 (H) 09/29/2021 1725   CREATININE 0.84 07/06/2015 0916   CALCIUM 9.2 09/29/2021  1725   PROT 6.8 08/24/2021 1136   PROT 6.5 10/01/2018 1036   ALBUMIN 4.2 08/24/2021 1136   ALBUMIN 4.2 10/01/2018 1036   AST 17 08/24/2021 1136   ALT 14 08/24/2021 1136   ALKPHOS 51 08/24/2021 1136   BILITOT 0.5 08/24/2021 1136   BILITOT 0.3 10/01/2018 1036   GFRNONAA 49 (L) 09/29/2021 1725   GFRNONAA 73 08/14/2011 1102   GFRAA 60 07/17/2019 1235   GFRAA 84 08/14/2011 1102     ASSESSMENT AND PLAN: 81 year old female with history of localized colon cancer s/p resection in 2016, with chronic rectal bleeding from internal hemorrhoids.  She is also having problems with fecal incontinence and seepage as well.  She has  bile acid diarrhea and a weak EAS on exam which is likely contributing to her incontinence.  She has frequent bleeding from internal hemorrhoids, as well prolapse symptoms.  She does not have problems with constipation, straining or hard stools, but does have frequent stools which is controlled with cholestyramine and metamucil. Her bleeding is almost  certainly coming from hemorrhoids.  The likelihood of another etiology (mass lesion, proctitis) is extremely low.   We discussed management of hemorrhoids including hemorrhoid banding, and the patient would like to proceed with banding.  We discussed the lack of clear guidance on performing banding on patients on antiplatelet agents/anticoagulants.  The patient has held her Plavix many times in the past for procedures.  Given the bleeding risk from banding is typically after the band is placed, I would recommend the patient hold her Plavix for 5 days after the band is placed. I think she would also benefit from pelvic floor physical therapy to improve her incontinent symptoms. Continue metamucil and cholestyramine.  Internal hemorrhoids - Continue Metamucil - Schedule for hemorrhoid banding - Hold Plavix for 5 days after band is placed.  Fecal incontinence - May be improved somewhat with hemorrhoid banding - Pelvic floor physical therapy - Metamucil and cholestyramine  History of colon cancer - Potential repeat colonoscopy in 2026 (based on relative health at that time)  Nicki Reaper E. Candis Schatz, MD [facility]  CC:  [name], MD

## 2021-10-20 ENCOUNTER — Other Ambulatory Visit: Payer: Self-pay | Admitting: Physician Assistant

## 2021-10-23 NOTE — Progress Notes (Deleted)
Cardiology Office Note:    Date:  10/23/2021   ID:  Samantha [name], Samantha [name] 1940-12-05, MRN [id number]  PCP:  [name], MD   [facility] Providers Cardiologist:  Freada Bergeron, MD Cardiology APP:  Sharmon Revere  {    Referring MD: [name], MD    History of Present Illness:    Samantha [name] is a 81 y.o. female with a hx of moderate nonobstructive CAD by cath in 8588, chronic diastolic HF, HTN, HLD, DMII, symptomatic PVCs, reactive airway disease and history of TIA who was previously followed by [name] who now presents to clinic for follow-up.  Per review of the record, patient underwent cath in 03/2016 which showed 40% mid RCA stenosis and 50% mid LAD stenosis. The patient continued to have intermittent exertional and resting chest pain that has resolved after she was started on Imdur 30 mg daily.  Saw [name] in 10/2019 prior to surgery for knee replacement. Had myoview 10/2019 which showed no evidence of ischemia or infarction and normal LVEF. TTE 10/2019 with LVEF 60-65%, normal strain, mild dilation of ascending aorta 10m.  Saw me in 07/2020 where she was having intermittent chest discomfort. Imdur was increased at that time. Cardiac monitor with with rare ectopy, 1 run of SVT with longest lasting 15 beats.   Seen in clinic on 09/2020 where she was having more chest pain. Resumed on ranexa '500mg'$  BID.  Was last seen in clinic on 04/20/21 where she was doing overall well from a CV standpoint. Was intermittently needing nitro for chest pain. Also dealing with family stress and neuralgia at that time.  Today, ***  Past Medical History:  Diagnosis Date   [place]fib [place]    Abnormal CT of the chest 2008   last CT4-l 2009:  . No f/u suggested    Allergy    Asthma    CAD (coronary artery disease)    a. Coronary CTA 10/16: Coronary Ca score 211, mod non-obstructive CAD with LM mild plaque (25-50%), mid LAD 50-69%. b. Neg nuc 06/2015.    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[place]   Minimally invasive right hemicolectomy to be done    Neuromuscular disorder ([place]    FIBROMYALGIA   Ocular migraine    OSA (obstructive sleep apnea) 09/2007   dx w/ a sleep study, not on  CPAP   Osteoarthritis    Osteoporosis    Pneumonia    "double" in 2004   PONV (postoperative nausea and vomiting)    Reactive airway disease 01/29/2002   dx of pseudoasthma / vcd in 2005 and nl sprirometry History of dyspnea, 2011,  improved after several medications were changed around Question of COPD, disproved July 06, 2009 with nl pft's       Rheumatoid factor positive    Shingles 11/2009   Sleep apnea    Stroke ([place]    TIA (transient ischemic attack)    x2 - on Plavix for this   Torn rotator cuff    right worse than left, both  are torn   Tumor, 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 Pernell Dupre (done after colon surgery)   CATARACT EXTRACTION W/ INTRAOCULAR LENS  IMPLANT, BILATERAL  2012   COLON SURGERY  10/2014   EYE SURGERY Bilateral    torq lens for cataracts   LEFT HEART CATH AND CORONARY ANGIOGRAPHY N/A 03/22/2016   Procedure: Left Heart Cath and Coronary Angiography;  Surgeon: Belva Crome, MD;  Location: [place]  CV LAB;  Service: Cardiovascular;  Laterality: N/A;   LUMBAR LAMINECTOMY/DECOMPRESSION MICRODISCECTOMY N/A 01/20/2018   Procedure: L4-5 decompression;  Surgeon: Marybelle Killings, MD;  Location: [place];  Service: Orthopedics;  Laterality: N/A;   THYROIDECTOMY, PARTIAL  1960's   TOTAL KNEE ARTHROPLASTY Right 12/04/2019   Procedure: RIGHT TOTAL KNEE ARTHROPLASTY;  Surgeon: Marybelle Killings, MD;  Location: [place];  Service: Orthopedics;  Laterality: Right;  RNFA APRIL [place]   "Laser surgery for vaginal cancer; followed by chemotherapy" (06/27/2012)    Current Medications: No outpatient medications have been marked as taking for the 10/27/21 encounter (Appointment) with Freada Bergeron, MD.     Allergies:   Bactrim [sulfamethoxazole-trimethoprim], Cefuroxime axetil, Oxycodone, Pravastatin, Terfenadine, Zocor [simvastatin], Tramadol, Atorvastatin, Cefuroxime, Lactose intolerance (gi), Latex, Lime flavor [flavoring agent], Metformin and related, Oxycodone hcl, Pravastatin sodium, Rosuvastatin, Rosuvastatin calcium, and Tape   Social History   Socioeconomic History   Marital status: Married    Spouse name: Ilona Sorrel   Number of children: 2   Years of education: masters   Highest education level: Not on file  Occupational History   Occupation: Retired, disable since 2000    Employer: RETIRED  Tobacco Use   Smoking status: Former    Packs/day: 0.25    Years: 5.00    Total pack years: 1.25    Types: Cigarettes    Quit date: 01/29/1998    Years since quitting: 23.7   Smokeless tobacco: Never   Tobacco comments:    Quit in 2001  Vaping Use   Vaping Use: Never used  Substance and Sexual Activity   Alcohol use: No    Alcohol/week: 0.0 standard drinks of alcohol   Drug use: No   Sexual activity: Never  Other Topics Concern   Not on file  Social History Narrative   On disability since 2000--- also husband has MS   Education. College.   Right handed.   Social  Determinants of Health   Financial Resource Strain: Low Risk  (12/16/2020)   Overall Financial Resource Strain (CARDIA)    Difficulty of Paying Living Expenses: Not hard at all  Food Insecurity: No Food Insecurity (08/02/2021)   Hunger Vital Sign    Worried About Running Out of Food in the Last Year: Never true    Ran Out of Food in the Last Year: Never true  Transportation Needs: No Transportation Needs (08/02/2021)   PRAPARE - Transportation    Lack of Transportation (Medical): No    Lack of  Transportation (Non-Medical): No  Physical Activity: Insufficiently Active (12/16/2020)   Exercise Vital Sign    Days of Exercise per Week: 4 days    Minutes of Exercise per Session: 30 min  Stress: No Stress Concern Present (12/16/2020)   [place]    Feeling of Stress : Not at all  Social Connections: Moderately Isolated (12/16/2020)   Social Connection and Isolation Panel [NHANES]    Frequency of Communication with Friends and Family: Twice a week    Frequency of Social Gatherings with Friends and Family: Twice a week    Attends Religious Services: More than 4 times per year    Active Member of [employer] or Organizations: No    Attends Archivist Meetings: Never    Marital Status: Widowed     Family History: The patient's family history includes Allergies in her sister; Aneurysm in her brother; Asthma in her paternal grandmother and sister; Diabetes in her brother and brother; Emphysema in her brother; Heart disease in her brother, father, mother, and another family member; Kidney failure in her brother; Lung cancer in her mother; Parkinsonism in her sister; Stroke in her brother, maternal grandmother, and paternal grandmother. There is no history of Breast cancer, Colon cancer, Heart attack, Esophageal cancer, Rectal cancer, or Stomach cancer.  ROS:   Please see the history of present illness.    Review of Systems   Constitutional:  Negative for chills and fever.  HENT:  Negative for sore throat.   Eyes:  Negative for blurred vision.  Respiratory:  Negative for shortness of breath and wheezing.   Cardiovascular:  Positive for chest pain. Negative for palpitations, orthopnea, claudication, leg swelling and PND.  Gastrointestinal:  Negative for heartburn.  Genitourinary:  Negative for hematuria.  Musculoskeletal:  Negative for joint pain and myalgias.  Neurological:  Positive for weakness (Imbalance) and headaches.  Endo/Heme/Allergies:  Does not bruise/bleed easily.  Psychiatric/Behavioral:  Negative for substance abuse. The patient is nervous/anxious (Stress).      EKGs/Labs/Other Studies Reviewed:    The following studies were reviewed today: Echocardiogram 09/20/20 EF 65-70, no RWMA, mild LVH, normal RVSF, trivial MR, mild AV calcification, mild AV sclerosis without stenosis, borderline dilation of ascending aorta (38 mm)   Carotid US 09/16/20 Bilateral ICA 1-39    LONG TERM MONITOR (3-7 DAYS) INTERPRETATION 08/29/2020 Narrative  Patient had a minimum heart rate of 53 bpm, maximum heart rate of 141 bpm, and average heart rate of 72 bpm.  Predominant underlying rhythm was sinus rhythm.  One run of supraventricular tachycardia occurred lasting 15 beats at longest with a max rate of 141 bpm at fastest.  Isolated PACs were rare (<1.0%)  Isolated PVCs were rare (<1.0%).  First degree heart block noted.  Triggered and diary events associated with sinus rhythm. No malignant arrhythmias.   GATED SPECT MYO PERF W/LEXISCAN STRESS 1D 11/18/2019 Narrative 1. Normal study without ischemia or infarction. 2. Normal LVEF, 63%. 3. This is a low-risk study.   LEFT HEART CATH AND CORONARY ANGIOGRAPHY 03/22/2016 Narrative  Widely patent coronary arteries with 30-50% mid LAD narrowing and 40% mid RCA narrowing. Coronary arteries are tortuous.  Right dominant coronary anatomy. LAD wraps around the  left ventricular apex.  Normal LV function. Normal LVEDP. EF 60%.  Suspect chest discomfort is not ischemic.   Carotid US 5/14:   < 39% bilateral ICA stenosis.    Holter monitor x 24 hours 07/2012:  NSR with PVCs.  EKG:  EKG was not ordered today  Recent Labs: 08/24/2021: ALT 14 09/29/2021: BUN 21; Creatinine, Ser 1.14; Hemoglobin 12.9; Platelets 124; Potassium 4.3; Sodium 141  Recent Lipid Panel    Component Value Date/Time   CHOL 154 08/24/2021 1136   CHOL 135 10/01/2018 1036   TRIG 95.0 08/24/2021 1136   HDL 85.10 08/24/2021 1136   HDL 84 10/01/2018 1036   CHOLHDL 2 08/24/2021 1136   VLDL 19.0 08/24/2021 1136   LDLCALC 50 08/24/2021 1136   LDLCALC 34 10/01/2018 1036   LDLDIRECT 126.2 12/07/2010 1039     Risk Assessment/Calculations:           Physical Exam:    VS:  There were no vitals taken for this visit.    Wt Readings from Last 3 Encounters:  10/19/21 210 lb (95.3 kg)  10/17/21 205 lb (93 kg)  10/09/21 205 lb (93 kg)     GEN:  Well nourished, well developed in no acute distress HEENT: Normal NECK: No JVD; No carotid bruits CARDIAC: RRR, 1/6 systolic murmur. No rubs, gallops RESPIRATORY:  Clear to auscultation without rales, wheezing or rhonchi  ABDOMEN: Soft, non-tender, non-distended MUSCULOSKELETAL:  No edema; No deformity  SKIN: Warm and dry NEUROLOGIC:  Alert and oriented x 3 PSYCHIATRIC:  Normal affect   ASSESSMENT:    No diagnosis found.   PLAN:    In order of problems listed above:  #Moderate nonobstructive disease: Cardiac cath 2018 with 40% mid RCA 50% mid LAD treated medically. Continues to have intermittent chest discomfort that is sometimes exertional and sometimes at rest. Recent stress test 10/2019 with no evidence of ischemia or infarction. TTE with normal LVEF, no significant valve disease. Will optimize medications at this time and monitor response.  -Continue plavix '75mg'$  daily -Continue losartan '25mg'$  daily -Continue imdur  '90mg'$  daily -Continue bystolic '10mg'$  daily -Continue repatha '140mg'$  q2 weeks -Continue ranexa '500mg'$  BID  #Palpitations: Improved. Cardiac monitor with rare ectopy and 1 run of nonsustained SVT. -Continue bystolic '10mg'$  daily  #Chronic Diastolic Heart Failure: Doing well and euvolemic on examination. -Continue torsemide '20mg'$  every other day -Continue losartan '25mg'$  daily -Continue farxiga '10mg'$  daily -Low Na diet  #HTN: -Continue imdur '90mg'$  daily -Continue bystolic '10mg'$  daily -Continue losartan '25mg'$  daily (did not tolerate higher dose due to hypotension)  #HLD: -Continue repatha '140mg'$  q2 weeks -LDL very well controlled at 32 (01/2021)  #History of TIA: -Continue plavix '75mg'$  daily -Continue repatha as above   Medication Adjustments/Labs and Tests Ordered: Current medicines are reviewed at length with the patient today.  Concerns regarding medicines are outlined above.  No orders of the defined types were placed in this encounter.  No orders of the defined types were placed in this encounter.   There are no Patient Instructions on file for this visit.   I,Mykaella Javier,acting as a scribe for Freada Bergeron, MD.,have documented all relevant documentation on the behalf of Freada Bergeron, MD,as directed by  Freada Bergeron, MD while in the presence of Freada Bergeron, MD.  I, Freada Bergeron, MD, have reviewed all documentation for this visit. The documentation on 10/23/21 for the exam, diagnosis, procedures, and orders are all accurate and complete.   Signed, Freada Bergeron, MD  10/23/2021 7:15 AM    [place]

## 2021-10-24 ENCOUNTER — Other Ambulatory Visit: Payer: Self-pay | Admitting: Internal Medicine

## 2021-10-26 ENCOUNTER — Telehealth: Payer: Self-pay | Admitting: Gastroenterology

## 2021-10-26 NOTE — Telephone Encounter (Signed)
Inbound call from patient states she doesn't think she will proceed with the pelvic floor physical therapy Dr. Candis Schatz suggested she have. She wanted him to be aware that she is having too many medical issues to proceed with this right now.

## 2021-10-27 ENCOUNTER — Ambulatory Visit: Payer: Medicare Other | Admitting: Cardiology

## 2021-10-30 DIAGNOSIS — M1990 Unspecified osteoarthritis, unspecified site: Secondary | ICD-10-CM | POA: Diagnosis not present

## 2021-10-30 DIAGNOSIS — E669 Obesity, unspecified: Secondary | ICD-10-CM | POA: Diagnosis not present

## 2021-10-30 DIAGNOSIS — M359 Systemic involvement of connective tissue, unspecified: Secondary | ICD-10-CM | POA: Diagnosis not present

## 2021-10-30 DIAGNOSIS — M79645 Pain in left finger(s): Secondary | ICD-10-CM | POA: Diagnosis not present

## 2021-10-30 DIAGNOSIS — Z79899 Other long term (current) drug therapy: Secondary | ICD-10-CM | POA: Diagnosis not present

## 2021-10-30 DIAGNOSIS — I10 Essential (primary) hypertension: Secondary | ICD-10-CM | POA: Diagnosis not present

## 2021-10-30 DIAGNOSIS — M79643 Pain in unspecified hand: Secondary | ICD-10-CM | POA: Diagnosis not present

## 2021-10-30 DIAGNOSIS — I7300 Raynaud's syndrome without gangrene: Secondary | ICD-10-CM | POA: Diagnosis not present

## 2021-10-30 DIAGNOSIS — E785 Hyperlipidemia, unspecified: Secondary | ICD-10-CM | POA: Diagnosis not present

## 2021-10-30 DIAGNOSIS — M069 Rheumatoid arthritis, unspecified: Secondary | ICD-10-CM | POA: Diagnosis not present

## 2021-10-30 DIAGNOSIS — M797 Fibromyalgia: Secondary | ICD-10-CM | POA: Diagnosis not present

## 2021-11-01 ENCOUNTER — Encounter: Payer: Self-pay | Admitting: Gastroenterology

## 2021-11-01 ENCOUNTER — Ambulatory Visit (INDEPENDENT_AMBULATORY_CARE_PROVIDER_SITE_OTHER): Payer: Medicare Other | Admitting: Gastroenterology

## 2021-11-01 DIAGNOSIS — K641 Second degree hemorrhoids: Secondary | ICD-10-CM | POA: Diagnosis not present

## 2021-11-01 NOTE — Progress Notes (Signed)
PROCEDURE NOTE: The patient presents with symptomatic grade 2  hemorrhoids, requesting rubber band ligation of his/her hemorrhoidal disease.  All risks, benefits and alternative forms of therapy were described and informed consent was obtained.   The anorectum was pre-medicated with topical lidocaine (5%) and nitroglycerin (0.125%) The decision was made to band the left lateral internal hemorrhoid, and the [place] was used to perform band ligation without complication.  Digital anorectal examination was then performed to assure proper positioning of the band, and to adjust the banded tissue as required.  The patient was discharged home without pain or other issues.  Dietary and behavioral recommendations were given and along with follow-up instructions.     The following adjunctive treatments were recommended:  Fiber supplementation Adequate water intake Avoidance of straining and hard stools Hold Plavix for 5 days.  The patient will return in 3-4 weeks for  follow-up and possible additional banding as required. No complications were encountered and the patient tolerated the procedure well.

## 2021-11-01 NOTE — Patient Instructions (Signed)
If you are age 79 or older, your body mass index should be between 23-30. Your Body mass index is 36.9 kg/m. If this is out of the aforementioned range listed, please consider follow up with your Primary Care Provider.  If you are age 26 or younger, your body mass index should be between 19-25. Your Body mass index is 36.9 kg/m. If this is out of the aformentioned range listed, please consider follow up with your Primary Care Provider.   Hold Plavix 5 days  HEMORRHOID BANDING PROCEDURE    FOLLOW-UP CARE   The procedure you have had should have been relatively painless since the banding of the area involved does not have nerve endings and there is no pain sensation.  The rubber band cuts off the blood supply to the hemorrhoid and the band may fall off as soon as 48 hours after the banding (the band may occasionally be seen in the toilet bowl following a bowel movement). You may notice a temporary feeling of fullness in the rectum which should respond adequately to plain Tylenol or Motrin.  Following the banding, avoid strenuous exercise that evening and resume full activity the next day.  A sitz bath (soaking in a warm tub) or bidet is soothing, and can be useful for cleansing the area after bowel movements.     To avoid constipation, take two tablespoons of natural wheat bran, natural oat bran, flax, Benefiber or any over the counter fiber supplement and increase your water intake to 7-8 glasses daily.    Unless you have been prescribed anorectal medication, do not put anything inside your rectum for two weeks: No suppositories, enemas, fingers, etc.  Occasionally, you may have more bleeding than usual after the banding procedure.  This is often from the untreated hemorrhoids rather than the treated one.  Don't be concerned if there is a tablespoon or so of blood.  If there is more blood than this, lie flat with your bottom higher than your head and apply an ice pack to the area. If the  bleeding does not stop within a half an hour or if you feel faint, call our office at [phone] or go to the emergency room.  Problems are not common; however, if there is a substantial amount of bleeding, severe pain, chills, fever or difficulty passing urine (very rare) or other problems, you should call us at [phone] or report to the nearest emergency room.  Do not stay seated continuously for more than 2-3 hours for a day or two after the procedure.  Tighten your buttock muscles 10-15 times every two hours and take 10-15 deep breaths every 1-2 hours.  Do not spend more than a few minutes on the toilet if you cannot empty your bowel; instead re-visit the toilet at a later time.    The [facility] providers would like to encourage you to use [facility] to communicate with providers for non-urgent requests or questions.  Due to long hold times on the telephone, sending your provider a message by [facility] may be a faster and more efficient way to get a response.  Please allow 48 business hours for a response.  Please remember that this is for non-urgent requests.  _______________________________________________________

## 2021-11-07 ENCOUNTER — Other Ambulatory Visit: Payer: Self-pay

## 2021-11-07 MED ORDER — NITROGLYCERIN 0.4 MG SL SUBL: SUBLINGUAL_TABLET | SUBLINGUAL | 5 refills | Status: DC

## 2021-11-24 NOTE — Progress Notes (Unsigned)
Cardiology Office Note:    Date:  11/24/2021   ID:  Samantha [name], Samantha [name] 08/13/1940, MRN [id number]  PCP:  [name], MD   [facility] Providers Cardiologist:  Freada Bergeron, MD Cardiology APP:  Sharmon Revere  {    Referring MD: [name], MD    History of Present Illness:    Samantha [name] is a 81 y.o. female with a hx of moderate nonobstructive CAD by cath in 1751, chronic diastolic HF, HTN, HLD, DMII, symptomatic PVCs, reactive airway disease and history of TIA who was previously followed by [name] who now presents to clinic for follow-up.  Per review of the record, patient underwent cath in 03/2016 which showed 40% mid RCA stenosis and 50% mid LAD stenosis. The patient continued to have intermittent exertional and resting chest pain that has resolved after she was started on Imdur 30 mg daily.  Saw [name] in 10/2019 prior to surgery for knee replacement. Had myoview 10/2019 which showed no evidence of ischemia or infarction and normal LVEF. TTE 10/2019 with LVEF 60-65%, normal strain, mild dilation of ascending aorta 32m.  Saw me in 07/2020 where she was having intermittent chest discomfort. Imdur was increased at that time. Cardiac monitor with with rare ectopy, 1 run of SVT with longest lasting 15 beats.   Seen in clinic on 09/2020 where she was having more chest pain. Resumed on ranexa '500mg'$  BID.  Was last seen in clinic on 04/20/21 where she was doing overall well from a CV standpoint. Was intermittently needing nitro for chest pain. Also dealing with family stress and neuralgia at that time.  Today, ***  Past Medical History:  Diagnosis Date   A-fib ([place]    Abnormal CT of the chest 2008   last CT4-l 2009:  . No f/u suggested    Allergy    Asthma    CAD (coronary artery disease)    a. Coronary CTA 10/16: Coronary Ca score 211, mod non-obstructive CAD with LM mild plaque (25-50%), mid LAD 50-69%. b. Neg nuc 06/2015.    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[place] 2016   Minimally invasive right hemicolectomy to be done    Neuromuscular disorder ([facility]    FIBROMYALGIA   Ocular migraine    OSA (obstructive sleep apnea) 09/2007   dx w/ a sleep study, not on  CPAP   Osteoarthritis    Osteoporosis    Pneumonia    "double" in 2004   PONV (postoperative nausea and vomiting)    Reactive airway disease 01/29/2002   dx of pseudoasthma / vcd in 2005 and nl sprirometry History of dyspnea, 2011,  improved after several medications were changed around Question of COPD, disproved July 06, 2009 with nl pft's       Rheumatoid factor positive    Shingles 11/2009   Sleep apnea    Stroke ([place]    TIA (transient ischemic attack)    x2 - on Plavix for this   Torn rotator cuff    right worse than left, both  are torn   Tumor, 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 Pernell Dupre (done after colon surgery)   CATARACT EXTRACTION W/ INTRAOCULAR LENS  IMPLANT, BILATERAL  2012   COLON SURGERY  10/2014   EYE SURGERY Bilateral    torq lens for cataracts   LEFT HEART CATH AND CORONARY ANGIOGRAPHY N/A 03/22/2016   Procedure: Left Heart Cath and Coronary Angiography;  Surgeon: Belva Crome, MD;  Location: [place]  CV LAB;  Service: Cardiovascular;  Laterality: N/A;   LUMBAR LAMINECTOMY/DECOMPRESSION MICRODISCECTOMY N/A 01/20/2018   Procedure: L4-5 decompression;  Surgeon: Marybelle Killings, MD;  Location: [place];  Service: Orthopedics;  Laterality: N/A;   THYROIDECTOMY, PARTIAL  1960's   TOTAL KNEE ARTHROPLASTY Right 12/04/2019   Procedure: RIGHT TOTAL KNEE ARTHROPLASTY;  Surgeon: Marybelle Killings, MD;  Location: [place];  Service: Orthopedics;  Laterality: Right;  RNFA APRIL [place]   "Laser surgery for vaginal cancer; followed by chemotherapy" (06/27/2012)    Current Medications: No outpatient medications have been marked as taking for the 11/28/21 encounter (Appointment) with Freada Bergeron, MD.     Allergies:   Bactrim [sulfamethoxazole-trimethoprim], Cefuroxime axetil, Oxycodone, Pravastatin, Terfenadine, Zocor [simvastatin], Tramadol, Atorvastatin, Cefuroxime, Lactose intolerance (gi), Latex, Lime flavor [flavoring agent], Metformin and related, Oxycodone hcl, Pravastatin sodium, Rosuvastatin, Rosuvastatin calcium, and Tape   Social History   Socioeconomic History   Marital status: Married    Spouse name: Ilona Sorrel   Number of children: 2   Years of education: masters   Highest education level: Not on file  Occupational History   Occupation: Retired, disable since 2000    Employer: RETIRED  Tobacco Use   Smoking status: Former    Packs/day: 0.25    Years: 5.00    Total pack years: 1.25    Types: Cigarettes    Quit date: 01/29/1998    Years since quitting: 23.8   Smokeless tobacco: Never   Tobacco comments:    Quit in 2001  Vaping Use   Vaping Use: Never used  Substance and Sexual Activity   Alcohol use: No    Alcohol/week: 0.0 standard drinks of alcohol   Drug use: No   Sexual activity: Never  Other Topics Concern   Not on file  Social History Narrative   On disability since 2000--- also husband has MS   Education. College.   Right handed.   Social  Determinants of Health   Financial Resource Strain: Low Risk  (12/16/2020)   Overall Financial Resource Strain (CARDIA)    Difficulty of Paying Living Expenses: Not hard at all  Food Insecurity: No Food Insecurity (08/02/2021)   Hunger Vital Sign    Worried About Running Out of Food in the Last Year: Never true    Ran Out of Food in the Last Year: Never true  Transportation Needs: No Transportation Needs (08/02/2021)   PRAPARE - Transportation    Lack of Transportation (Medical): No    Lack of  Transportation (Non-Medical): No  Physical Activity: Insufficiently Active (12/16/2020)   Exercise Vital Sign    Days of Exercise per Week: 4 days    Minutes of Exercise per Session: 30 min  Stress: No Stress Concern Present (12/16/2020)   [place]    Feeling of Stress : Not at all  Social Connections: Moderately Isolated (12/16/2020)   Social Connection and Isolation Panel [NHANES]    Frequency of Communication with Friends and Family: Twice a week    Frequency of Social Gatherings with Friends and Family: Twice a week    Attends Religious Services: More than 4 times per year    Active Member of [employer] or Organizations: No    Attends Archivist Meetings: Never    Marital Status: Widowed     Family History: The patient's family history includes Allergies in her sister; Aneurysm in her brother; Asthma in her paternal grandmother and sister; Diabetes in her brother and brother; Emphysema in her brother; Heart disease in her brother, father, mother, and another family member; Kidney failure in her brother; Lung cancer in her mother; Parkinsonism in her sister; Stroke in her brother, maternal grandmother, and paternal grandmother. There is no history of Breast cancer, Colon cancer, Heart attack, Esophageal cancer, Rectal cancer, or Stomach cancer.  ROS:   Please see the history of present illness.    Review of Systems   Constitutional:  Negative for chills and fever.  HENT:  Negative for sore throat.   Eyes:  Negative for blurred vision.  Respiratory:  Negative for shortness of breath and wheezing.   Cardiovascular:  Positive for chest pain. Negative for palpitations, orthopnea, claudication, leg swelling and PND.  Gastrointestinal:  Negative for heartburn.  Genitourinary:  Negative for hematuria.  Musculoskeletal:  Negative for joint pain and myalgias.  Neurological:  Positive for weakness (Imbalance) and headaches.  Endo/Heme/Allergies:  Does not bruise/bleed easily.  Psychiatric/Behavioral:  Negative for substance abuse. The patient is nervous/anxious (Stress).      EKGs/Labs/Other Studies Reviewed:    The following studies were reviewed today: Echocardiogram 09/20/20 EF 65-70, no RWMA, mild LVH, normal RVSF, trivial MR, mild AV calcification, mild AV sclerosis without stenosis, borderline dilation of ascending aorta (38 mm)   Carotid US 09/16/20 Bilateral ICA 1-39    LONG TERM MONITOR (3-7 DAYS) INTERPRETATION 08/29/2020 Narrative  Patient had a minimum heart rate of 53 bpm, maximum heart rate of 141 bpm, and average heart rate of 72 bpm.  Predominant underlying rhythm was sinus rhythm.  One run of supraventricular tachycardia occurred lasting 15 beats at longest with a max rate of 141 bpm at fastest.  Isolated PACs were rare (<1.0%)  Isolated PVCs were rare (<1.0%).  First degree heart block noted.  Triggered and diary events associated with sinus rhythm. No malignant arrhythmias.   GATED SPECT MYO PERF W/LEXISCAN STRESS 1D 11/18/2019 Narrative 1. Normal study without ischemia or infarction. 2. Normal LVEF, 63%. 3. This is a low-risk study.   LEFT HEART CATH AND CORONARY ANGIOGRAPHY 03/22/2016 Narrative  Widely patent coronary arteries with 30-50% mid LAD narrowing and 40% mid RCA narrowing. Coronary arteries are tortuous.  Right dominant coronary anatomy. LAD wraps around the  left ventricular apex.  Normal LV function. Normal LVEDP. EF 60%.  Suspect chest discomfort is not ischemic.   Carotid US 5/14:   < 39% bilateral ICA stenosis.    Holter monitor x 24 hours 07/2012:  NSR with PVCs.  EKG:  EKG was not ordered today  Recent Labs: 08/24/2021: ALT 14 09/29/2021: BUN 21; Creatinine, Ser 1.14; Hemoglobin 12.9; Platelets 124; Potassium 4.3; Sodium 141  Recent Lipid Panel    Component Value Date/Time   CHOL 154 08/24/2021 1136   CHOL 135 10/01/2018 1036   TRIG 95.0 08/24/2021 1136   HDL 85.10 08/24/2021 1136   HDL 84 10/01/2018 1036   CHOLHDL 2 08/24/2021 1136   VLDL 19.0 08/24/2021 1136   LDLCALC 50 08/24/2021 1136   LDLCALC 34 10/01/2018 1036   LDLDIRECT 126.2 12/07/2010 1039     Risk Assessment/Calculations:           Physical Exam:    VS:  There were no vitals taken for this visit.    Wt Readings from Last 3 Encounters:  11/01/21 205 lb (93 kg)  10/19/21 210 lb (95.3 kg)  10/17/21 205 lb (93 kg)     GEN:  Well nourished, well developed in no acute distress HEENT: Normal NECK: No JVD; No carotid bruits CARDIAC: RRR, 1/6 systolic murmur. No rubs, gallops RESPIRATORY:  Clear to auscultation without rales, wheezing or rhonchi  ABDOMEN: Soft, non-tender, non-distended MUSCULOSKELETAL:  No edema; No deformity  SKIN: Warm and dry NEUROLOGIC:  Alert and oriented x 3 PSYCHIATRIC:  Normal affect   ASSESSMENT:    No diagnosis found.   PLAN:    In order of problems listed above:  #Moderate nonobstructive disease: Cardiac cath 2018 with 40% mid RCA 50% mid LAD treated medically. Continues to have intermittent chest discomfort that is sometimes exertional and sometimes at rest. Recent stress test 10/2019 with no evidence of ischemia or infarction. TTE with normal LVEF, no significant valve disease. Will optimize medications at this time and monitor response.  -Continue plavix '75mg'$  daily -Continue losartan '25mg'$  daily -Continue imdur  '90mg'$  daily -Continue bystolic '10mg'$  daily -Continue repatha '140mg'$  q2 weeks -Continue ranexa '500mg'$  BID  #Palpitations: Improved. Cardiac monitor with rare ectopy and 1 run of nonsustained SVT. -Continue bystolic '10mg'$  daily  #Chronic Diastolic Heart Failure: Doing well and euvolemic on examination. -Continue torsemide '20mg'$  every other day -Continue losartan '25mg'$  daily -Continue farxiga '10mg'$  daily -Low Na diet  #HTN: -Continue imdur '90mg'$  daily -Continue bystolic '10mg'$  daily -Continue losartan '25mg'$  daily (did not tolerate higher dose due to hypotension)  #HLD: -Continue repatha '140mg'$  q2 weeks -LDL very well controlled at 32 (01/2021)  #History of TIA: -Continue plavix '75mg'$  daily -Continue repatha as above   Medication Adjustments/Labs and Tests Ordered: Current medicines are reviewed at length with the patient today.  Concerns regarding medicines are outlined above.  No orders of the defined types were placed in this encounter.  No orders of the defined types were placed in this encounter.   There are no Patient Instructions on file for this visit.   I,Mykaella Javier,acting as a scribe for Freada Bergeron, MD.,have documented all relevant documentation on the behalf of Freada Bergeron, MD,as directed by  Freada Bergeron, MD while in the presence of Freada Bergeron, MD.  I, Freada Bergeron, MD, have reviewed all documentation for this visit. The documentation on 11/24/21 for the exam, diagnosis, procedures, and orders are all accurate and complete.   Signed, Freada Bergeron, MD  11/24/2021 8:42 PM    [place]

## 2021-11-28 ENCOUNTER — Encounter: Payer: Self-pay | Admitting: Cardiology

## 2021-11-28 ENCOUNTER — Ambulatory Visit: Payer: Medicare Other | Attending: Cardiology | Admitting: Cardiology

## 2021-11-28 VITALS — BP 108/70 | HR 66 | Ht 62.5 in | Wt 204.0 lb

## 2021-11-28 DIAGNOSIS — I5032 Chronic diastolic (congestive) heart failure: Secondary | ICD-10-CM | POA: Diagnosis not present

## 2021-11-28 DIAGNOSIS — I2583 Coronary atherosclerosis due to lipid rich plaque: Secondary | ICD-10-CM | POA: Diagnosis not present

## 2021-11-28 DIAGNOSIS — I10 Essential (primary) hypertension: Secondary | ICD-10-CM

## 2021-11-28 DIAGNOSIS — I2510 Atherosclerotic heart disease of native coronary artery without angina pectoris: Secondary | ICD-10-CM | POA: Diagnosis not present

## 2021-11-28 DIAGNOSIS — E782 Mixed hyperlipidemia: Secondary | ICD-10-CM | POA: Diagnosis not present

## 2021-11-28 DIAGNOSIS — G459 Transient cerebral ischemic attack, unspecified: Secondary | ICD-10-CM

## 2021-11-28 DIAGNOSIS — I77819 Aortic ectasia, unspecified site: Secondary | ICD-10-CM

## 2021-11-28 DIAGNOSIS — I700 Atherosclerosis of aorta: Secondary | ICD-10-CM

## 2021-11-28 NOTE — Progress Notes (Signed)
Cardiology Office Note:    Date:  11/28/2021   ID:  Judianne, Seiple [birth year], MRN [id number]  PCP:  [name], MD   [facility] Providers Cardiologist:  Freada Bergeron, MD Cardiology APP:  Sharmon Revere  {    Referring MD: [name], MD    History of Present Illness:    JALAYIAH BIBIAN is a 81 y.o. female with a hx of moderate nonobstructive CAD by cath in 7681, chronic diastolic HF, HTN, HLD, DMII, symptomatic PVCs, reactive airway disease and history of TIA who was previously followed by [name] who now presents to clinic for follow-up.  Per review of the record, patient underwent cath in 03/2016 which showed 40% mid RCA stenosis and 50% mid LAD stenosis. The patient continued to have intermittent exertional and resting chest pain that has resolved after she was started on Imdur 30 mg daily.  Saw [name] in 10/2019 prior to surgery for knee replacement. Had myoview 10/2019 which showed no evidence of ischemia or infarction and normal LVEF. TTE 10/2019 with LVEF 60-65%, normal strain, mild dilation of ascending aorta 32m.  Saw me in 07/2020 where she was having intermittent chest discomfort. Imdur was increased at that time. Cardiac monitor with with rare ectopy, 1 run of SVT with longest lasting 15 beats.   Seen in clinic on 09/2020 where she was having more chest pain. Resumed on ranexa '500mg'$  BID.  Was last seen in clinic on 04/20/21 where she was doing overall well from a CV standpoint. Was intermittently needing nitro for chest pain. Also dealing with family stress and neuralgia at that time.  Today, the patient states that she has been feeling pretty good. Her chest pain has improved, she attributes this to the medication. She states that every now and then she gets a little but it feels good most of the time.  She has been having allergies recently, she believes it to be due to dust from construction in her neighborhood. This  has made her feel more SOB. No orthopnea or PND.  Her blood pressure had been up and down recently. She states that her systolic pressure has been between 150 and 170 at the highest but fluctuates. When she takes her medicines it improves, she believes that it gets high when she eats too much salt.  She denies any BLE edema but complains of foot soreness.  She complains of neck pain which radiates to her head. She has been taking Lyrica to manage and has seen neuro.  She denies any palpitations, shortness of breath, or peripheral edema. No lightheadedness, syncope, orthopnea, or PND. Compliant with medications. No bleeding issues.   Past Medical History:  Diagnosis Date   A-fib ([facility]    Abnormal CT of the chest 2008   last CT4-l 2009:  . No f/u suggested    Allergy    Asthma    CAD (coronary artery disease)    a. Coronary CTA 10/16: Coronary Ca score 211, mod non-obstructive CAD with LM mild plaque (25-50%), mid LAD 50-69%. b. Neg nuc 06/2015.   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   Minimally invasive right hemicolectomy to be done    Neuromuscular disorder ([place])    FIBROMYALGIA   Ocular migraine    OSA (obstructive sleep apnea) 09/2007   dx w/ a sleep study, not on  CPAP   Osteoarthritis    Osteoporosis    Pneumonia    "double" in 2004   PONV (postoperative nausea and vomiting)    Reactive airway disease 01/29/2002   dx of pseudoasthma / vcd in 2005 and nl sprirometry History of dyspnea, 2011,  improved after several medications were changed around Question of COPD, disproved July 06, 2009 with nl pft's       Rheumatoid factor  positive    Shingles 11/2009   Sleep apnea    Stroke ([place])    TIA (transient ischemic attack)    x2 - on Plavix for this   Torn rotator cuff    right worse than left, both are torn   Tumor, 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 Pernell Dupre (done after colon surgery)   CATARACT EXTRACTION W/ INTRAOCULAR LENS  IMPLANT, BILATERAL  2012   COLON SURGERY  10/2014   EYE SURGERY Bilateral    torq lens for cataracts   LEFT HEART CATH AND CORONARY ANGIOGRAPHY N/A 03/22/2016   Procedure: Left Heart Cath and Coronary Angiography;  Surgeon: Belva Crome, MD;  Location: [place] [facility];  Service: Cardiovascular;  Laterality: N/A;   LUMBAR LAMINECTOMY/DECOMPRESSION MICRODISCECTOMY N/A 01/20/2018   Procedure: L4-5 decompression;  Surgeon: Marybelle Killings, MD;  Location: [place];  Service: Orthopedics;  Laterality: N/A;   THYROIDECTOMY, PARTIAL  1960's   TOTAL KNEE ARTHROPLASTY Right 12/04/2019   Procedure: RIGHT TOTAL KNEE ARTHROPLASTY;  Surgeon: Marybelle Killings, MD;  Location: [place];  Service: Orthopedics;  Laterality: Right;  [place]   "Laser surgery for vaginal cancer; followed by chemotherapy" (06/27/2012)    Current Medications: Current Meds  Medication Sig   acetaminophen (TYLENOL) 500 MG tablet Take 500 mg by mouth every 6 (six) hours as needed for mild pain.    albuterol (PROVENTIL) (2.5 MG/3ML) 0.083% nebulizer solution Take 3 mLs (2.5 mg total) by nebulization every 6 (six) hours as needed for wheezing or shortness of breath.   Calcium-Magnesium-Zinc (CAL-MAG-ZINC PO)  Take 1 tablet by mouth daily.   cetirizine (ZYRTEC) 10 MG tablet TAKE 1 TABLET(10 MG) BY MOUTH DAILY   cholecalciferol (VITAMIN D) 1000 UNITS tablet Take 1,000 Units by mouth every morning.    clopidogrel (PLAVIX) 75 MG tablet TAKE 1 TABLET(75 MG) BY MOUTH DAILY   DULoxetine (CYMBALTA) 30 MG capsule Take 1 capsule (30 mg total) by mouth daily.   Erenumab-aooe (AIMOVIG) 70 MG/ML SOAJ Inject 70 mg into the skin every 30 (thirty) days.   Evolocumab (REPATHA SURECLICK) 154 MG/ML SOAJ INJECT 1 PEN UNDER THE SKIN EVERY 14 DAYS   famotidine (PEPCID) 40  MG tablet TAKE 1 TABLET(40 MG) BY MOUTH DAILY AS NEEDED FOR HEARTBURN OR INDIGESTION   FARXIGA 10 MG TABS tablet TAKE 1 TABLET(10 MG) BY MOUTH DAILY BEFORE BREAKFAST   glucose blood test strip Use as instructed   hydroxychloroquine (PLAQUENIL) 200 MG tablet Take 400 mg by mouth daily.    isosorbide mononitrate (IMDUR) 30 MG 24 hr tablet Take 3 tablets (90 mg total) by mouth daily.   Lancets (ONETOUCH ULTRASOFT) lancets Use as instructed   losartan (COZAAR) 25 MG tablet TAKE 1 TABLET(25 MG) BY MOUTH DAILY   nebivolol (BYSTOLIC) 10 MG tablet TAKE 1 TABLET(10 MG) BY MOUTH DAILY   nitroGLYCERIN (NITROSTAT) 0.4 MG SL tablet DISSOLVE 1 TABLET UNDER THE TONGUE EVERY 5 MINUTES AS NEEDED FOR CHEST PAIN   Polyethyl Glycol-Propyl Glycol (SYSTANE) 0.4-0.3 % GEL ophthalmic gel Place 1 application into both eyes daily as needed (for dry eyes).    pregabalin (LYRICA) 75 MG capsule TAKE 1 CAPSULE(75 MG) BY MOUTH THREE TIMES DAILY   psyllium (METAMUCIL) 58.6 % powder Take 1 packet by mouth daily.   ranolazine (RANEXA) 500 MG 12 hr tablet TAKE 1 TABLET(500 MG) BY MOUTH TWICE DAILY   Rimegepant Sulfate (NURTEC) 75 MG TBDP Take 1 tab at onset of migraine.  May repeat in 2 hrs, if needed.  Max dose: 2 tabs/day. This is a 30 day prescription.   SYMBICORT 80-4.5 MCG/ACT inhaler INHALE 2 PUFFS INTO THE LUNGS TWICE DAILY   torsemide (DEMADEX) 20 MG tablet Take one tablet by  mouth every other day   VENTOLIN HFA 108 (90 Base) MCG/ACT inhaler INHALE 2 PUFFS INTO THE LUNGS EVERY 6 HOURS AS NEEDED FOR WHEEZING OR SHORTNESS OF BREATH     Allergies:   Bactrim [sulfamethoxazole-trimethoprim], Cefuroxime axetil, Oxycodone, Pravastatin, Terfenadine, Zocor [simvastatin], Tramadol, Atorvastatin, Cefuroxime, Lactose intolerance (gi), Latex, Lime flavor [flavoring agent], Metformin and related, Oxycodone hcl, Pravastatin sodium, Rosuvastatin, Rosuvastatin calcium, and Tape   Social History   Socioeconomic History   Marital status: Married    Spouse name: Ilona Sorrel   Number of children: 2   Years of education: masters   Highest education level: Not on file  Occupational History   Occupation: Retired, disable since 2000    Employer: RETIRED  Tobacco Use   Smoking status: Former    Packs/day: 0.25    Years: 5.00    Total pack years: 1.25    Types: Cigarettes    Quit date: 01/29/1998    Years since quitting: 23.8   Smokeless tobacco: Never   Tobacco comments:    Quit in 2001  Vaping Use   Vaping Use: Never used  Substance and Sexual Activity   Alcohol use: No    Alcohol/week: 0.0 standard drinks of alcohol   Drug use: No   Sexual activity: Never  Other Topics Concern   Not on file  Social History Narrative   On disability since 2000--- also husband has MS   Education. College.   Right handed.   Social Determinants of Health   Financial Resource Strain: Low Risk  (12/16/2020)   Overall Financial Resource Strain (CARDIA)    Difficulty of Paying Living Expenses: Not hard at all  Food Insecurity: No Food Insecurity (08/02/2021)   Hunger Vital Sign    Worried About Running Out of Food in the Last Year: Never true    Ran Out of Food in the Last Year: Never true  Transportation Needs: No Transportation Needs (08/02/2021)   PRAPARE - Transportation  Lack of Transportation (Medical): No    Lack of Transportation (Non-Medical): No  Physical Activity: Insufficiently  Active (12/16/2020)   Exercise Vital Sign    Days of Exercise per Week: 4 days    Minutes of Exercise per Session: 30 min  Stress: No Stress Concern Present (12/16/2020)   [place]    Feeling of Stress : Not at all  Social Connections: Moderately Isolated (12/16/2020)   Social Connection and Isolation Panel [NHANES]    Frequency of Communication with Friends and Family: Twice a week    Frequency of Social Gatherings with Friends and Family: Twice a week    Attends Religious Services: More than 4 times per year    Active Member of [employer] or Organizations: No    Attends Archivist Meetings: Never    Marital Status: Widowed     Family History: The patient's family history includes Allergies in her sister; Aneurysm in her brother; Asthma in her paternal grandmother and sister; Diabetes in her brother and brother; Emphysema in her brother; Heart disease in her brother, father, mother, and another family member; Kidney failure in her brother; Lung cancer in her mother; Parkinsonism in her sister; Stroke in her brother, maternal grandmother, and paternal grandmother. There is no history of Breast cancer, Colon cancer, Heart attack, Esophageal cancer, Rectal cancer, or Stomach cancer.  ROS:   Please see the history of present illness.    Review of Systems  Constitutional:  Negative for chills and fever.  HENT:  Negative for sore throat.   Eyes:  Negative for blurred vision.  Respiratory:  Negative for shortness of breath and wheezing.   Cardiovascular:  Positive for chest pain. Negative for palpitations, orthopnea, claudication, leg swelling and PND.  Gastrointestinal:  Negative for heartburn.  Genitourinary:  Negative for hematuria.  Musculoskeletal:  Positive for neck pain (radiates to the head). Negative for joint pain and myalgias.  Neurological:  Positive for headaches. Negative for weakness (Imbalance).   Endo/Heme/Allergies:  Does not bruise/bleed easily.  Psychiatric/Behavioral:  Negative for substance abuse. The patient is not nervous/anxious (Stress).      EKGs/Labs/Other Studies Reviewed:    The following studies were reviewed today:  Echocardiogram 09/20/20 EF 65-70, no RWMA, mild LVH, normal RVSF, trivial MR, mild AV calcification, mild AV sclerosis without stenosis, borderline dilation of ascending aorta (38 mm)   Carotid US 09/16/20 Bilateral ICA 1-39    LONG TERM MONITOR (3-7 DAYS) INTERPRETATION 08/29/2020 Narrative  Patient had a minimum heart rate of 53 bpm, maximum heart rate of 141 bpm, and average heart rate of 72 bpm.  Predominant underlying rhythm was sinus rhythm.  One run of supraventricular tachycardia occurred lasting 15 beats at longest with a max rate of 141 bpm at fastest.  Isolated PACs were rare (<1.0%)  Isolated PVCs were rare (<1.0%).  First degree heart block noted.  Triggered and diary events associated with sinus rhythm. No malignant arrhythmias.   GATED SPECT MYO PERF W/LEXISCAN STRESS 1D 11/18/2019 Narrative 1. Normal study without ischemia or infarction. 2. Normal LVEF, 63%. 3. This is a low-risk study.   LEFT HEART CATH AND CORONARY ANGIOGRAPHY 03/22/2016 Narrative  Widely patent coronary arteries with 30-50% mid LAD narrowing and 40% mid RCA narrowing. Coronary arteries are tortuous.  Right dominant coronary anatomy. LAD wraps around the left ventricular apex.  Normal LV function. Normal LVEDP. EF 60%.  Suspect chest discomfort is not ischemic.   Carotid US 5/14:   <  39% bilateral ICA stenosis.    Holter monitor x 24 hours 07/2012:  NSR with PVCs.     EKG:  The EKG is personally reviewed 11/28/21: NSR 1st degree AV block Incomplete RBBB 66bpm  Recent Labs: 08/24/2021: ALT 14 09/29/2021: BUN 21; Creatinine, Ser 1.14; Hemoglobin 12.9; Platelets 124; Potassium 4.3; Sodium 141  Recent Lipid Panel    Component Value Date/Time    CHOL 154 08/24/2021 1136   CHOL 135 10/01/2018 1036   TRIG 95.0 08/24/2021 1136   HDL 85.10 08/24/2021 1136   HDL 84 10/01/2018 1036   CHOLHDL 2 08/24/2021 1136   VLDL 19.0 08/24/2021 1136   LDLCALC 50 08/24/2021 1136   LDLCALC 34 10/01/2018 1036   LDLDIRECT 126.2 12/07/2010 1039     Risk Assessment/Calculations:           Physical Exam:    VS:  BP 108/70   Pulse 66   Ht 5' 2.5" (1.588 m)   Wt 204 lb (92.5 kg)   SpO2 96%   BMI 36.72 kg/m     Wt Readings from Last 3 Encounters:  11/28/21 204 lb (92.5 kg)  11/01/21 205 lb (93 kg)  10/19/21 210 lb (95.3 kg)     GEN:   Well nourished, well developed in no acute distress HEENT: Normal NECK: No JVD; No carotid bruits CARDIAC: RRR, 1/6 systolic murmur. No rubs, gallops RESPIRATORY:  Clear to auscultation without rales, wheezing or rhonchi  ABDOMEN: Soft, non-tender, non-distended MUSCULOSKELETAL:  No edema; No deformity  SKIN: Warm and dry NEUROLOGIC:  Alert and oriented x 3 PSYCHIATRIC:  Normal affect   ASSESSMENT:    1. Coronary artery disease due to lipid rich plaque   2. Chronic heart failure with preserved ejection fraction ([place])   3. Mixed hyperlipidemia   4. TIA (transient ischemic attack)   5. Essential hypertension   6. Aortic atherosclerosis ([place])   7. Dilation of aorta (HCC)     PLAN:    In order of problems listed above:  #Moderate nonobstructive disease: Cardiac cath 2018 with 40% mid RCA 50% mid LAD treated medically. Continues to have intermittent chest discomfort that is sometimes exertional and sometimes at rest. Recent stress test 10/2019 with no evidence of ischemia or infarction. TTE with normal LVEF, no significant valve disease. Will optimize medications at this time and monitor response.  -Continue plavix '75mg'$  daily -Continue losartan '25mg'$  daily -Continue imdur '90mg'$  daily -Continue bystolic '10mg'$  daily -Continue repatha '140mg'$  q2 weeks -Continue ranexa '500mg'$   BID  #Palpitations: Improved. Cardiac monitor with rare ectopy and 1 run of nonsustained SVT. -Continue bystolic '10mg'$  daily  #Chronic Diastolic Heart Failure: Doing well and euvolemic on examination. NYHA class II symptoms. -Continue torsemide '20mg'$  every other day -Continue losartan '25mg'$  daily -Continue farxiga '10mg'$  daily -Low Na diet  #HTN: Well controlled in clinic and responds well to medications. Runs high when she has too much salt.  -Continue imdur '90mg'$  daily -Continue bystolic '10mg'$  daily -Continue losartan '25mg'$  daily (did not tolerate higher dose due to hypotension) -Low Na diet  #HLD: -Continue repatha '140mg'$  q2 weeks -LDL very well controlled at 50 (07/2021)  #History of TIA: -Continue plavix '75mg'$  daily -Continue repatha as above  Follow Up: 6 Months  Medication Adjustments/Labs and Tests Ordered: Current medicines are reviewed at length with the patient today.  Concerns regarding medicines are outlined above.  Orders Placed This Encounter  Procedures   EKG 12-Lead   ECHOCARDIOGRAM COMPLETE   No orders of the defined  types were placed in this encounter.   Patient Instructions  Medication Instructions:   Your physician recommends that you continue on your current medications as directed. Please refer to the Current Medication list given to you today.  *If you need a refill on your cardiac medications before your next appointment, please call your pharmacy*   Testing/Procedures:  Your physician has requested that you have an echocardiogram. Echocardiography is a painless test that uses sound waves to create images of your heart. It provides your doctor with information about the size and shape of your heart and how well your heart's chambers and valves are working. This procedure takes approximately one hour. There are no restrictions for this procedure. Please do NOT wear cologne, perfume, aftershave, or lotions (deodorant is allowed). Please arrive 15  minutes prior to your appointment time.    Follow-Up: At [facility], you and your health needs are our priority.  As part of our continuing mission to provide you with exceptional heart care, we have created designated Provider Care Teams.  These Care Teams include your primary Cardiologist (physician) and Advanced Practice Providers (APPs -  Physician Assistants and Nurse Practitioners) who all work together to provide you with the care you need, when you need it.  We recommend signing up for the patient portal called "MyChart".  Sign up information is provided on this After Visit Summary.  MyChart is used to connect with patients for Virtual Visits (Telemedicine).  Patients are able to view lab/test results, encounter notes, upcoming appointments, etc.  Non-urgent messages can be sent to your provider as well.   To learn more about what you can do with MyChart, go to [email or web address].    Your next appointment:   6 month(s)  The format for your next appointment:   In Person  Provider:   Freada Bergeron, MD     Important Information About Sugar        I,Coren O'Brien,acting as a scribe for Freada Bergeron, MD.,have documented all relevant documentation on the behalf of Freada Bergeron, MD,as directed by  Freada Bergeron, MD while in the presence of Freada Bergeron, MD.  I, Freada Bergeron, MD, have reviewed all documentation for this visit. The documentation on 11/28/21 for the exam, diagnosis, procedures, and orders are all accurate and complete.   Signed, Freada Bergeron, MD  11/28/2021 9:57 AM    [place]

## 2021-11-28 NOTE — Patient Instructions (Signed)
Medication Instructions:   Your physician recommends that you continue on your current medications as directed. Please refer to the Current Medication list given to you today.  *If you need a refill on your cardiac medications before your next appointment, please call your pharmacy*   Testing/Procedures:  Your physician has requested that you have an echocardiogram. Echocardiography is a painless test that uses sound waves to create images of your heart. It provides your doctor with information about the size and shape of your heart and how well your heart's chambers and valves are working. This procedure takes approximately one hour. There are no restrictions for this procedure. Please do NOT wear cologne, perfume, aftershave, or lotions (deodorant is allowed). Please arrive 15 minutes prior to your appointment time.    Follow-Up: At [facility], you and your health needs are our priority.  As part of our continuing mission to provide you with exceptional heart care, we have created designated Provider Care Teams.  These Care Teams include your primary Cardiologist (physician) and Advanced Practice Providers (APPs -  Physician Assistants and Nurse Practitioners) who all work together to provide you with the care you need, when you need it.  We recommend signing up for the patient portal called "MyChart".  Sign up information is provided on this After Visit Summary.  MyChart is used to connect with patients for Virtual Visits (Telemedicine).  Patients are able to view lab/test results, encounter notes, upcoming appointments, etc.  Non-urgent messages can be sent to your provider as well.   To learn more about what you can do with MyChart, go to [email or web address].    Your next appointment:   6 month(s)  The format for your next appointment:   In Person  Provider:   Heather E Pemberton, MD     Important Information About Sugar       

## 2021-12-12 ENCOUNTER — Telehealth: Payer: Self-pay | Admitting: Internal Medicine

## 2021-12-12 NOTE — Telephone Encounter (Signed)
Left message for patient to call back and schedule Medicare Annual Wellness Visit (AWV).   Please offer to do virtually or by telephone.   Last AWV:12/16/2020   Please schedule at anytime with [place] Advisor schedule   45 minute appointent  If any questions, please contact me at [phone]

## 2021-12-15 ENCOUNTER — Encounter: Payer: Medicare Other | Admitting: Gastroenterology

## 2021-12-18 ENCOUNTER — Ambulatory Visit (HOSPITAL_COMMUNITY): Payer: Medicare Other | Attending: Cardiology

## 2021-12-18 DIAGNOSIS — G459 Transient cerebral ischemic attack, unspecified: Secondary | ICD-10-CM | POA: Insufficient documentation

## 2021-12-18 DIAGNOSIS — I700 Atherosclerosis of aorta: Secondary | ICD-10-CM | POA: Diagnosis not present

## 2021-12-18 DIAGNOSIS — I10 Essential (primary) hypertension: Secondary | ICD-10-CM | POA: Diagnosis not present

## 2021-12-18 DIAGNOSIS — I2583 Coronary atherosclerosis due to lipid rich plaque: Secondary | ICD-10-CM | POA: Diagnosis not present

## 2021-12-18 DIAGNOSIS — I77819 Aortic ectasia, unspecified site: Secondary | ICD-10-CM

## 2021-12-18 DIAGNOSIS — I5032 Chronic diastolic (congestive) heart failure: Secondary | ICD-10-CM | POA: Diagnosis not present

## 2021-12-18 DIAGNOSIS — I2510 Atherosclerotic heart disease of native coronary artery without angina pectoris: Secondary | ICD-10-CM | POA: Insufficient documentation

## 2021-12-18 DIAGNOSIS — E782 Mixed hyperlipidemia: Secondary | ICD-10-CM | POA: Diagnosis not present

## 2021-12-18 LAB — ECHOCARDIOGRAM COMPLETE
Area-P 1/2: 2.37 cm2
S' Lateral: 1.9 cm

## 2021-12-19 ENCOUNTER — Telehealth: Payer: Self-pay | Admitting: *Deleted

## 2021-12-19 ENCOUNTER — Ambulatory Visit (INDEPENDENT_AMBULATORY_CARE_PROVIDER_SITE_OTHER): Payer: Medicare Other

## 2021-12-19 VITALS — Ht 62.5 in | Wt 204.0 lb

## 2021-12-19 DIAGNOSIS — Z0000 Encounter for general adult medical examination without abnormal findings: Secondary | ICD-10-CM | POA: Diagnosis not present

## 2021-12-19 DIAGNOSIS — I5032 Chronic diastolic (congestive) heart failure: Secondary | ICD-10-CM

## 2021-12-19 DIAGNOSIS — I2510 Atherosclerotic heart disease of native coronary artery without angina pectoris: Secondary | ICD-10-CM

## 2021-12-19 DIAGNOSIS — G459 Transient cerebral ischemic attack, unspecified: Secondary | ICD-10-CM

## 2021-12-19 DIAGNOSIS — I77819 Aortic ectasia, unspecified site: Secondary | ICD-10-CM

## 2021-12-19 NOTE — Telephone Encounter (Signed)
The patient has been notified of the result and verbalized understanding.  All questions (if any) were answered.  Pt aware that we will do a repeat echo on her in one year for surveillance.  She is aware that I will go ahead and place the order for repeat echo in one year in the system and send a message to our [facility] Schedulers to call her back around that time, to arrange this appt.  Pt verbalized understanding and agrees with this plan.

## 2021-12-19 NOTE — Telephone Encounter (Signed)
-----   Message from Freada Bergeron, MD sent at 12/18/2021  9:17 PM EST ----- Her echo looks good with normal pumping function and no significant valve disease. Her aorta is very mildly dilated and stable from prior. We will continue to monitor with yearly echoes.

## 2021-12-19 NOTE — Progress Notes (Signed)
Virtual Visit via Telephone Note  I connected with  Samantha [name] on 12/19/21 at 11:15 AM EST by telephone and verified that I am speaking with the correct person using two identifiers.  Location: Patient: Home Provider: [place] Persons participating in the virtual visit: [place]   I discussed the limitations, risks, security and privacy concerns of performing an evaluation and management service by telephone and the availability of in person appointments. The patient expressed understanding and agreed to proceed.  Interactive audio and video telecommunications were attempted between this nurse and patient, however failed, due to patient having technical difficulties OR patient did not have access to video capability.  We continued and completed visit with audio only.  Some vital signs may be absent or patient reported.   Sheral Flow, LPN  Subjective:   Samantha [name] is a 81 y.o. female who presents for Medicare Annual (Subsequent) preventive examination.  Review of Systems     Cardiac Risk Factors include: advanced age (>65mn, >>64women);diabetes mellitus;dyslipidemia;family history of premature cardiovascular disease;hypertension;obesity (BMI >30kg/m2)     Objective:    Today's Vitals   12/19/21 1118  Weight: 204 lb (92.5 kg)  Height: 5' 2.5" (1.588 m)  PainSc: 3   PainLoc: Neck   Body mass index is 36.72 kg/m.     12/19/2021   11:24 AM 09/29/2021    4:37 PM 12/16/2020   11:29 AM 06/24/2020    5:30 PM 12/04/2019    6:40 AM 08/27/2019   10:43 AM 08/21/2018    9:31 AM  Advanced Directives  Does Patient Have a Medical Advance Directive? Yes No Yes No No Yes Yes  Type of [place]  [place] will   Living will [place]  Does patient want to make changes to medical advance directive?     No - Patient declined No - Patient  declined   Copy of [place] Chart? No - copy requested  No - copy requested    No - copy requested  Would patient like information on creating a medical advance directive?     No - Patient declined      Current Medications (verified) Outpatient Encounter Medications as of 12/19/2021  Medication Sig   acetaminophen (TYLENOL) 500 MG tablet Take 500 mg by mouth every 6 (six) hours as needed for mild pain.    albuterol (PROVENTIL) (2.5 MG/3ML) 0.083% nebulizer solution Take 3 mLs (2.5 mg total) by nebulization every 6 (six) hours as needed for wheezing or shortness of breath.   Calcium-Magnesium-Zinc (CAL-MAG-ZINC PO) Take 1 tablet by mouth daily.   cetirizine (ZYRTEC) 10 MG tablet TAKE 1 TABLET(10 MG) BY MOUTH DAILY   cholecalciferol (VITAMIN D) 1000 UNITS tablet Take 1,000 Units by mouth every morning.    clopidogrel (PLAVIX) 75 MG tablet TAKE 1 TABLET(75 MG) BY MOUTH DAILY   DULoxetine (CYMBALTA) 30 MG capsule Take 1 capsule (30 mg total) by mouth daily.   Erenumab-aooe (AIMOVIG) 70 MG/ML SOAJ Inject 70 mg into the skin every 30 (thirty) days.   Evolocumab (REPATHA SURECLICK) 1998MG/ML SOAJ INJECT 1 PEN UNDER THE SKIN EVERY 14 DAYS   famotidine (PEPCID) 40 MG tablet TAKE 1 TABLET(40 MG) BY MOUTH DAILY AS NEEDED FOR HEARTBURN OR INDIGESTION   FARXIGA 10 MG TABS tablet TAKE 1 TABLET(10 MG) BY MOUTH DAILY BEFORE BREAKFAST   glucose blood test strip Use as instructed  hydroxychloroquine (PLAQUENIL) 200 MG tablet Take 400 mg by mouth daily.    isosorbide mononitrate (IMDUR) 30 MG 24 hr tablet Take 3 tablets (90 mg total) by mouth daily.   Lancets (ONETOUCH ULTRASOFT) lancets Use as instructed   losartan (COZAAR) 25 MG tablet TAKE 1 TABLET(25 MG) BY MOUTH DAILY   nebivolol (BYSTOLIC) 10 MG tablet TAKE 1 TABLET(10 MG) BY MOUTH DAILY   nitroGLYCERIN (NITROSTAT) 0.4 MG SL tablet DISSOLVE 1 TABLET UNDER THE TONGUE EVERY 5 MINUTES AS NEEDED FOR CHEST PAIN   Polyethyl  Glycol-Propyl Glycol (SYSTANE) 0.4-0.3 % GEL ophthalmic gel Place 1 application into both eyes daily as needed (for dry eyes).    pregabalin (LYRICA) 75 MG capsule TAKE 1 CAPSULE(75 MG) BY MOUTH THREE TIMES DAILY   psyllium (METAMUCIL) 58.6 % powder Take 1 packet by mouth daily.   ranolazine (RANEXA) 500 MG 12 hr tablet TAKE 1 TABLET(500 MG) BY MOUTH TWICE DAILY   Rimegepant Sulfate (NURTEC) 75 MG TBDP Take 1 tab at onset of migraine.  May repeat in 2 hrs, if needed.  Max dose: 2 tabs/day. This is a 30 day prescription.   SYMBICORT 80-4.5 MCG/ACT inhaler INHALE 2 PUFFS INTO THE LUNGS TWICE DAILY   torsemide (DEMADEX) 20 MG tablet Take one tablet by mouth every other day   VENTOLIN HFA 108 (90 Base) MCG/ACT inhaler INHALE 2 PUFFS INTO THE LUNGS EVERY 6 HOURS AS NEEDED FOR WHEEZING OR SHORTNESS OF BREATH   No facility-administered encounter medications on file as of 12/19/2021.    Allergies (verified) Bactrim [sulfamethoxazole-trimethoprim], Cefuroxime axetil, Oxycodone, Pravastatin, Terfenadine, Zocor [simvastatin], Tramadol, Atorvastatin, Cefuroxime, Lactose intolerance (gi), Latex, Lime flavor [flavoring agent], Metformin and related, Oxycodone hcl, Pravastatin sodium, Rosuvastatin, Rosuvastatin calcium, and Tape   History: Past Medical History:  Diagnosis Date   A-fib ([place])    Abnormal CT of the chest 2008   last CT4-l 2009:  . No f/u suggested    Allergy    Asthma    CAD (coronary artery disease)    a. Coronary CTA 10/16: Coronary Ca score 211, mod non-obstructive CAD with LM mild plaque (25-50%), mid LAD 50-69%. b. Neg nuc 06/2015.   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   Minimally invasive right hemicolectomy to be done    Neuromuscular disorder (HCC)    FIBROMYALGIA   Ocular migraine    OSA (obstructive sleep apnea) 09/2007   dx w/ a sleep study, not on  CPAP   Osteoarthritis    Osteoporosis    Pneumonia    "double" in 2004   PONV (postoperative nausea and vomiting)    Reactive airway disease 01/29/2002   dx of pseudoasthma / vcd in 2005 and nl sprirometry History of dyspnea, 2011,  improved after several medications were changed around Question of COPD, disproved July 06, 2009 with nl pft's       Rheumatoid factor positive    Shingles 11/2009   Sleep apnea    Stroke ([place])    TIA (transient ischemic attack)    x2 - on Plavix for this  Torn rotator cuff    right worse than left, both are torn   Tumor, 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 Pernell Dupre (done after colon surgery)   CATARACT EXTRACTION W/ INTRAOCULAR LENS  IMPLANT, BILATERAL  2012   COLON SURGERY  10/2014   EYE SURGERY Bilateral    torq lens for cataracts   LEFT HEART CATH AND CORONARY ANGIOGRAPHY N/A 03/22/2016   Procedure: Left Heart Cath and Coronary Angiography;  Surgeon: Belva Crome, MD;  Location: [facility];  Service: Cardiovascular;  Laterality: N/A;   LUMBAR LAMINECTOMY/DECOMPRESSION MICRODISCECTOMY N/A 01/20/2018   Procedure: L4-5 decompression;  Surgeon: Marybelle Killings, MD;  Location: [place] [facility];  Service: Orthopedics;  Laterality: N/A;   THYROIDECTOMY, PARTIAL  1960's   TOTAL KNEE ARTHROPLASTY Right 12/04/2019   Procedure: RIGHT TOTAL KNEE  ARTHROPLASTY;  Surgeon: Marybelle Killings, MD;  Location: [facility];  Service: Orthopedics;  Laterality: Right;  RNFA APRIL Webb   "Laser surgery for vaginal cancer; followed by chemotherapy" (06/27/2012)   Family History  Problem Relation Age of Onset   Heart disease Father    Heart disease Mother    Lung cancer Mother    Allergies Sister    Parkinsonism Sister        possible   Asthma Sister    Asthma Paternal Grandmother    Stroke Paternal Grandmother    Heart disease Other        paternal grandparents, maternal grandparents,    Heart disease Brother    Emphysema Brother    Aneurysm Brother        x3   Kidney failure Brother    Diabetes Brother    Diabetes Brother    Stroke Brother    Stroke Maternal Grandmother    Breast cancer Neg Hx    Colon cancer Neg Hx    Heart attack Neg Hx    Esophageal cancer Neg Hx    Rectal cancer Neg Hx    Stomach cancer Neg Hx    Social History   Socioeconomic History   Marital status: Married    Spouse name: Ilona Sorrel   Number of children: 2   Years of education: masters   Highest education level: Not on file  Occupational History   Occupation: Retired, disable since 2000    Employer: RETIRED  Tobacco Use   Smoking status: Former    Packs/day: 0.25    Years: 5.00    Total pack years: 1.25    Types: Cigarettes    Quit date: 01/29/1998    Years since quitting: 23.9   Smokeless tobacco: Never   Tobacco comments:    Quit in 2001  Vaping Use   Vaping Use: Never used  Substance and Sexual Activity   Alcohol use: No    Alcohol/week: 0.0 standard drinks of alcohol   Drug use: No   Sexual activity: Never  Other Topics Concern   Not on file  Social History  Narrative   On disability since 2000--- also husband has MS   Education. College.   Right handed.   Social Determinants of Health   Financial Resource Strain: Low Risk  (12/19/2021)   Overall Financial Resource Strain (CARDIA)    Difficulty of Paying  Living Expenses: Not hard at all  Food Insecurity: No Food Insecurity (12/19/2021)   Hunger Vital Sign    Worried About Running Out of Food in the Last Year: Never true    Ran Out of Food in the Last Year: Never true  Transportation Needs: No Transportation Needs (12/19/2021)   PRAPARE - Hydrologist (Medical): No    Lack of Transportation (Non-Medical): No  Physical Activity: Sufficiently Active (12/19/2021)   Exercise Vital Sign    Days of Exercise per Week: 5 days    Minutes of Exercise per Session: 30 min  Stress: No Stress Concern Present (12/19/2021)   [place]    Feeling of Stress : Not at all  Social Connections: Moderately Isolated (12/19/2021)   Social Connection and Isolation Panel [NHANES]    Frequency of Communication with Friends and Family: Twice a week    Frequency of Social Gatherings with Friends and Family: Twice a week    Attends Religious Services: More than 4 times per year    Active Member of [employer] or Organizations: No    Attends Archivist Meetings: Never    Marital Status: Widowed    Tobacco Counseling Counseling given: Not Answered Tobacco comments: Quit in 2001   Clinical Intake:  Pre-visit preparation completed: Yes  Pain : No/denies pain Pain Score: 3      BMI - recorded: 36.72 Nutritional Status: BMI > 30  Obese Nutritional Risks: None Diabetes: No  How often do you need to have someone help you when you read instructions, pamphlets, or other written materials from your doctor or pharmacy?: 1 - Never What is the last grade level you completed in school?: College Degree  Nutrition Risk Assessment:  Has the patient had any N/V/D within the last 2 months?  No  Does the patient have any non-healing wounds?  Yes  (right leg; will be evaluated on 12/20/2021) Has the patient had any unintentional weight loss or weight gain?  No    Diabetes:  Is the patient diabetic?  Yes  If diabetic, was a CBG obtained today?  No  Did the patient bring in their glucometer from home?  No  How often do you monitor your CBG's? Every Other Day.   Financial Strains and Diabetes Management:  Are you having any financial strains with the device, your supplies or your medication? No .  Does the patient want to be seen by Chronic Care Management for management of their diabetes?  No  Would the patient like to be referred to a Nutritionist or for Diabetic Management?  No   Diabetic Exams:  Diabetic Eye Exam: Completed 07/17/2021 Diabetic Foot Exam: Overdue, Pt has been advised about the importance in completing this exam. Pt is scheduled for diabetic foot exam on 12/20/2021.   Interpreter Needed?: No  Information entered by :: Lisette Abu, LPN.   Activities of Daily Living    12/19/2021   11:26 AM  In your present state of health, do you have any difficulty performing the following activities:  Hearing? 0  Vision? 0  Difficulty concentrating or making decisions? 0  Walking or climbing stairs? 0  Dressing or bathing? 0  Doing errands, shopping? 0  Preparing Food and eating ? N  Using the Toilet? N  In the past six months, have you accidently leaked urine? Y  Comment use Pull-Ups at night  Do you have problems with loss of bowel control? N  Managing your Medications? N  Managing your Finances? N  Housekeeping or managing your Housekeeping? N    Patient Care Team: [name], MD as PCP - General (Internal Medicine) Freada Bergeron, MD as PCP - Cardiology (Cardiology) [name], MD as Consulting Physician (Ophthalmology) [name], MD as Consulting Physician (Cardiology) [name], MD as Consulting Physician (Obstetrics and Gynecology) [name], MD as Consulting Physician (General Surgery) Zadie Rhine Clent Demark, MD as Consulting Physician (Ophthalmology) Marybelle Killings, MD as  Consulting Physician (Orthopedic Surgery) Sharmon Revere as Physician Assistant (Cardiology)  Indicate any recent Medical Services you may have received from other than [facility] providers in the past year (date may be approximate).     Assessment:   This is a routine wellness examination for Samantha [name].  Hearing/Vision screen Hearing Screening - Comments:: Denies hearing difficulties   Vision Screening - Comments:: Wears rx glasses - up to date with routine eye exams with Deloria Lair, MD.   Dietary issues and exercise activities discussed: Current Exercise Habits: Home exercise routine, Type of exercise: walking, Time (Minutes): 30, Frequency (Times/Week): 5, Weekly Exercise (Minutes/Week): 150, Intensity: Mild, Exercise limited by: orthopedic condition(s)   Goals Addressed   None   Depression Screen    12/19/2021   11:26 AM 10/09/2021    9:21 AM 08/24/2021   10:39 AM 01/24/2021    1:15 PM 12/16/2020   11:30 AM 12/16/2020   11:27 AM 08/27/2019   11:02 AM  PHQ 2/9 Scores  PHQ - 2 Score 0 0 0 0 0 0 0  PHQ- 9 Score  0 0        Fall Risk    12/19/2021   11:25 AM 10/09/2021    9:25 AM 10/09/2021    9:21 AM 08/24/2021   10:38 AM 08/02/2021    3:00 PM  [place] in the past year? 0 0 0 0 0  Comment     continues to deny new/ recent falls; uses cane and walker as/ if needed/ indicated  Number falls in past yr: 0 0 0 0 0  Injury with Fall? 0 0 0 0 0  Comment     N/A- no falls reported  Risk for fall due to : No Fall Risks No Fall Risks No Fall Risks No Fall Risks Impaired mobility;History of fall(s);Orthopedic patient;Other (Comment);Medication side effect  Risk for fall due to: Comment     chronic pain  Follow up Falls prevention discussed Falls evaluation completed Falls evaluation completed Falls evaluation completed Falls prevention discussed    FALL RISK PREVENTION PERTAINING TO THE HOME:  Any stairs in or around the home? No  If so, are there any without  handrails? No  Home free of loose throw rugs in walkways, pet beds, electrical cords, etc? Yes  Adequate lighting in your home to reduce risk of falls? Yes   ASSISTIVE DEVICES UTILIZED TO PREVENT FALLS:  Life alert? No  Use of a cane, walker or w/c? Yes  Grab bars in the bathroom? Yes  Shower chair or bench in shower? Yes  Elevated toilet seat or a handicapped toilet? Yes   TIMED UP AND GO: Phone Visit  Was the test performed? No .   Cognitive Function:    08/13/2017    3:20 PM 10/19/2016    1:11 PM  MMSE - Mini Mental State Exam  Orientation to time 5 5  Orientation to Place 5 5  Registration 3 3  Attention/ Calculation 5 5  Recall 1 2  Language- name 2 objects 2 2  Language- repeat 1 1  Language- follow 3 step command 3 3  Language- read & follow direction 1 1  Write a sentence 1 1  Copy design 1 1  Total score 28 29        12/19/2021   11:26 AM  6CIT Screen  What Year? 0 points  What month? 0 points  What time? 0 points  Count back from 20 0 points  Months in reverse 0 points  Repeat phrase 0 points  Total Score 0 points    Immunizations Immunization History  Administered Date(s) Administered   Fluad Quad(high Dose 65+) 11/05/2018, 11/04/2019   Influenza Split 11/30/2010   Influenza Whole 10/20/2008, 01/02/2010   Influenza, High Dose Seasonal PF 12/24/2012, 03/17/2015, 10/19/2015, 10/19/2016, 10/23/2017   Influenza, Seasonal, Injecte, Preservative Fre 01/01/2012   Influenza,inj,Quad PF,6+ Mos 11/13/2013   Influenza-Unspecified 11/08/2020   PFIZER(Purple Top)SARS-COV-2 Vaccination 03/24/2019, 04/14/2019   Pneumococcal Conjugate-13 05/19/2014   Pneumococcal Polysaccharide-23 01/29/2002, 11/23/2008   Td 01/29/2001   Tdap 08/14/2011   Zoster, Live 12/24/2012    TDAP status: Due, Education has been provided regarding the importance of this vaccine. Advised may receive this vaccine at local pharmacy or Health Dept. Aware to provide a copy of the  vaccination record if obtained from local pharmacy or Health Dept. Verbalized acceptance and understanding.  Flu Vaccine status: Due, Education has been provided regarding the importance of this vaccine. Advised may receive this vaccine at local pharmacy or Health Dept. Aware to provide a copy of the vaccination record if obtained from local pharmacy or Health Dept. Verbalized acceptance and understanding.  Pneumococcal vaccine status: Up to date  Covid-19 vaccine status: Completed vaccines  Qualifies for Shingles Vaccine? Yes   Zostavax completed No   Shingrix Completed?: No.    Education has been provided regarding the importance of this vaccine. Patient has been advised to call insurance company to determine out of pocket expense if they have not yet received this vaccine. Advised may also receive vaccine at local pharmacy or Health Dept. Verbalized acceptance and understanding.  Screening Tests Health Maintenance  Topic Date Due   Zoster Vaccines- Shingrix (1 of 2) Never done   FOOT EXAM  05/05/2019   COVID-19 Vaccine (3 - Pfizer risk series) 05/12/2019   DEXA SCAN  06/11/2021   INFLUENZA VACCINE  08/29/2021   Diabetic kidney evaluation - Urine ACR  02/21/2022   HEMOGLOBIN A1C  02/24/2022   OPHTHALMOLOGY EXAM  07/18/2022   Diabetic kidney evaluation - GFR measurement  09/30/2022   Medicare Annual Wellness (AWV)  12/20/2022   COLONOSCOPY (Pts 45-77yr Insurance coverage will need to be confirmed)  04/19/2024   Pneumonia Vaccine 81 Years old  Completed   Hepatitis C Screening  Completed   HPV VACCINES  Aged Out    Health Maintenance  Health Maintenance Due  Topic Date Due   Zoster Vaccines- Shingrix (1 of 2) Never done   FOOT EXAM  05/05/2019   COVID-19 Vaccine (3 - Pfizer risk series) 05/12/2019   DEXA SCAN  06/11/2021   INFLUENZA VACCINE  08/29/2021    Colorectal cancer screening:  Type of screening: Colonoscopy. Completed 04/20/2019. Repeat every 5 years  Mammogram  status: Completed at [facility]. Repeat every year  Bone Density status: Completed at [facility]. Results reflect: Bone density results: NORMAL. Repeat every 2 years.  Lung Cancer Screening: (Low Dose CT Chest recommended if Age 87-80 years, 30 pack-year currently smoking OR have quit w/in 15years.) does not qualify.   Lung Cancer Screening Referral: no  Additional Screening:  Hepatitis C Screening: does qualify; Completed 11/04/2019  Vision Screening: Recommended annual ophthalmology exams for early detection of glaucoma and other disorders of the eye. Is the patient up to date with their annual eye exam?  Yes  Who is the provider or what is the name of the office in which the patient attends annual eye exams? Deloria Lair, MD and [name], MD. If pt is not established with a provider, would they like to be referred to a provider to establish care? No .   Dental Screening: Recommended annual dental exams for proper oral hygiene  Community Resource Referral / Chronic Care Management: CRR required this visit?  No   CCM required this visit?  No      Plan:     I have personally reviewed and noted the following in the patient's chart:   Medical and social history Use of alcohol, tobacco or illicit drugs  Current medications and supplements including opioid prescriptions. Patient is not currently taking opioid prescriptions. Functional ability and status Nutritional status Physical activity Advanced directives List of other physicians Hospitalizations, surgeries, and ER visits in previous 12 months Vitals Screenings to include cognitive, depression, and falls Referrals and appointments  In addition, I have reviewed and discussed with patient certain preventive protocols, quality metrics, and best practice recommendations. A written personalized care plan for preventive services as well as general preventive health recommendations were provided to  patient.     Sheral Flow, LPN   90/93/1121   Nurse Notes: N/A

## 2021-12-19 NOTE — Patient Instructions (Signed)
Ms. Samantha [name] , Thank you for taking time to come for your Medicare Wellness Visit. I appreciate your ongoing commitment to your health goals. Please review the following plan we discussed and let me know if I can assist you in the future.   These are the goals we discussed:  Goals       Patient Stated (pt-stated)      My goal is to get my knee surgery done, lose some weight and improve my quality of life.        This is a list of the screening recommended for you and due dates:  Health Maintenance  Topic Date Due   Zoster (Shingles) Vaccine (1 of 2) Never done   Complete foot exam   05/05/2019   COVID-19 Vaccine (3 - Pfizer risk series) 05/12/2019   DEXA scan (bone density measurement)  06/11/2021   Flu Shot  08/29/2021   Yearly kidney health urinalysis for diabetes  02/21/2022   Hemoglobin A1C  02/24/2022   Eye exam for diabetics  07/18/2022   Yearly kidney function blood test for diabetes  09/30/2022   Medicare Annual Wellness Visit  12/20/2022   Colon Cancer Screening  04/19/2024   Pneumonia Vaccine  Completed   Hepatitis C Screening: USPSTF Recommendation to screen - Ages 18-79 yo.  Completed   HPV Vaccine  Aged Out    Advanced directives: Yes  Conditions/risks identified: Yes; Type II Diabetes Mellitus  Next appointment: Follow up in one year for your annual wellness visit.   Preventive Care 35 Years and Older, Female Preventive care refers to lifestyle choices and visits with your health care provider that can promote health and wellness. What does preventive care include? A yearly physical exam. This is also called an annual well check. Dental exams once or twice a year. Routine eye exams. Ask your health care provider how often you should have your eyes checked. Personal lifestyle choices, including: Daily care of your teeth and gums. Regular physical activity. Eating a healthy diet. Avoiding tobacco and drug use. Limiting alcohol use. Practicing safe  sex. Taking low-dose aspirin every day. Taking vitamin and mineral supplements as recommended by your health care provider. What happens during an annual well check? The services and screenings done by your health care provider during your annual well check will depend on your age, overall health, lifestyle risk factors, and family history of disease. Counseling  Your health care provider may ask you questions about your: Alcohol use. Tobacco use. Drug use. Emotional well-being. Home and relationship well-being. Sexual activity. Eating habits. History of falls. Memory and ability to understand (cognition). Work and work Statistician. Reproductive health. Screening  You may have the following tests or measurements: Height, weight, and BMI. Blood pressure. Lipid and cholesterol levels. These may be checked every 5 years, or more frequently if you are over 57 years old. Skin check. Lung cancer screening. You may have this screening every year starting at age 16 if you have a 30-pack-year history of smoking and currently smoke or have quit within the past 15 years. Fecal occult blood test (FOBT) of the stool. You may have this test every year starting at age 32. Flexible sigmoidoscopy or colonoscopy. You may have a sigmoidoscopy every 5 years or a colonoscopy every 10 years starting at age 14. Hepatitis C blood test. Hepatitis B blood test. Sexually transmitted disease (STD) testing. Diabetes screening. This is done by checking your blood sugar (glucose) after you have not eaten for a while (  fasting). You may have this done every 1-3 years. Bone density scan. This is done to screen for osteoporosis. You may have this done starting at age 5. Mammogram. This may be done every 1-2 years. Talk to your health care provider about how often you should have regular mammograms. Talk with your health care provider about your test results, treatment options, and if necessary, the need for more  tests. Vaccines  Your health care provider may recommend certain vaccines, such as: Influenza vaccine. This is recommended every year. Tetanus, diphtheria, and acellular pertussis (Tdap, Td) vaccine. You may need a Td booster every 10 years. Zoster vaccine. You may need this after age 59. Pneumococcal 13-valent conjugate (PCV13) vaccine. One dose is recommended after age 64. Pneumococcal polysaccharide (PPSV23) vaccine. One dose is recommended after age 74. Talk to your health care provider about which screenings and vaccines you need and how often you need them. This information is not intended to replace advice given to you by your health care provider. Make sure you discuss any questions you have with your health care provider. Document Released: 02/11/2015 Document Revised: 10/05/2015 Document Reviewed: 11/16/2014 Elsevier Interactive Patient Education  2017 [place] Prevention in the Home Falls can cause injuries. They can happen to people of all ages. There are many things you can do to make your home safe and to help prevent falls. What can I do on the outside of my home? Regularly fix the edges of walkways and driveways and fix any cracks. Remove anything that might make you trip as you walk through a door, such as a raised step or threshold. Trim any bushes or trees on the path to your home. Use bright outdoor lighting. Clear any walking paths of anything that might make someone trip, such as rocks or tools. Regularly check to see if handrails are loose or broken. Make sure that both sides of any steps have handrails. Any raised decks and porches should have guardrails on the edges. Have any leaves, snow, or ice cleared regularly. Use sand or salt on walking paths during winter. Clean up any spills in your garage right away. This includes oil or grease spills. What can I do in the bathroom? Use night lights. Install grab bars by the toilet and in the tub and shower.  Do not use towel bars as grab bars. Use non-skid mats or decals in the tub or shower. If you need to sit down in the shower, use a plastic, non-slip stool. Keep the floor dry. Clean up any water that spills on the floor as soon as it happens. Remove soap buildup in the tub or shower regularly. Attach bath mats securely with double-sided non-slip rug tape. Do not have throw rugs and other things on the floor that can make you trip. What can I do in the bedroom? Use night lights. Make sure that you have a light by your bed that is easy to reach. Do not use any sheets or blankets that are too big for your bed. They should not hang down onto the floor. Have a firm chair that has side arms. You can use this for support while you get dressed. Do not have throw rugs and other things on the floor that can make you trip. What can I do in the kitchen? Clean up any spills right away. Avoid walking on wet floors. Keep items that you use a lot in easy-to-reach places. If you need to reach something above you, use  a strong step stool that has a grab bar. Keep electrical cords out of the way. Do not use floor polish or wax that makes floors slippery. If you must use wax, use non-skid floor wax. Do not have throw rugs and other things on the floor that can make you trip. What can I do with my stairs? Do not leave any items on the stairs. Make sure that there are handrails on both sides of the stairs and use them. Fix handrails that are broken or loose. Make sure that handrails are as long as the stairways. Check any carpeting to make sure that it is firmly attached to the stairs. Fix any carpet that is loose or worn. Avoid having throw rugs at the top or bottom of the stairs. If you do have throw rugs, attach them to the floor with carpet tape. Make sure that you have a light switch at the top of the stairs and the bottom of the stairs. If you do not have them, ask someone to add them for you. What else  can I do to help prevent falls? Wear shoes that: Do not have high heels. Have rubber bottoms. Are comfortable and fit you well. Are closed at the toe. Do not wear sandals. If you use a stepladder: Make sure that it is fully opened. Do not climb a closed stepladder. Make sure that both sides of the stepladder are locked into place. Ask someone to hold it for you, if possible. Clearly mark and make sure that you can see: Any grab bars or handrails. First and last steps. Where the edge of each step is. Use tools that help you move around (mobility aids) if they are needed. These include: Canes. Walkers. Scooters. Crutches. Turn on the lights when you go into a dark area. Replace any light bulbs as soon as they burn out. Set up your furniture so you have a clear path. Avoid moving your furniture around. If any of your floors are uneven, fix them. If there are any pets around you, be aware of where they are. Review your medicines with your doctor. Some medicines can make you feel dizzy. This can increase your chance of falling. Ask your doctor what other things that you can do to help prevent falls. This information is not intended to replace advice given to you by your health care provider. Make sure you discuss any questions you have with your health care provider. Document Released: 11/11/2008 Document Revised: 06/23/2015 Document Reviewed: 02/19/2014 Elsevier Interactive Patient Education  2017 [employer].

## 2021-12-20 DIAGNOSIS — L308 Other specified dermatitis: Secondary | ICD-10-CM | POA: Diagnosis not present

## 2021-12-20 DIAGNOSIS — L218 Other seborrheic dermatitis: Secondary | ICD-10-CM | POA: Diagnosis not present

## 2022-01-10 DIAGNOSIS — N893 Dysplasia of vagina, unspecified: Secondary | ICD-10-CM | POA: Insufficient documentation

## 2022-01-10 DIAGNOSIS — Z1272 Encounter for screening for malignant neoplasm of vagina: Secondary | ICD-10-CM | POA: Diagnosis not present

## 2022-01-10 DIAGNOSIS — Z1151 Encounter for screening for human papillomavirus (HPV): Secondary | ICD-10-CM | POA: Diagnosis not present

## 2022-01-10 DIAGNOSIS — Z124 Encounter for screening for malignant neoplasm of cervix: Secondary | ICD-10-CM | POA: Diagnosis not present

## 2022-01-10 DIAGNOSIS — Z6836 Body mass index (BMI) 36.0-36.9, adult: Secondary | ICD-10-CM | POA: Diagnosis not present

## 2022-01-10 DIAGNOSIS — Z1231 Encounter for screening mammogram for malignant neoplasm of breast: Secondary | ICD-10-CM | POA: Diagnosis not present

## 2022-01-17 ENCOUNTER — Encounter: Payer: Self-pay | Admitting: Gastroenterology

## 2022-01-17 ENCOUNTER — Ambulatory Visit (INDEPENDENT_AMBULATORY_CARE_PROVIDER_SITE_OTHER): Payer: Medicare Other | Admitting: Gastroenterology

## 2022-01-17 VITALS — BP 122/80 | HR 71 | Ht 62.5 in | Wt 206.0 lb

## 2022-01-17 DIAGNOSIS — K641 Second degree hemorrhoids: Secondary | ICD-10-CM | POA: Diagnosis not present

## 2022-01-17 NOTE — Patient Instructions (Signed)
_______________________________________________________  If you are age 79 or older, your body mass index should be between 23-30. Your Body mass index is 37.08 kg/m. If this is out of the aforementioned range listed, please consider follow up with your Primary Care Provider.  If you are age 65 or younger, your body mass index should be between 19-25. Your Body mass index is 37.08 kg/m. If this is out of the aformentioned range listed, please consider follow up with your Primary Care Provider.   Please contact us if your symptoms return.   Continue Metamucil and Cholestyramine.   The [facility] providers would like to encourage you to use [facility] to communicate with providers for non-urgent requests or questions.  Due to long hold times on the telephone, sending your provider a message by [facility] may be a faster and more efficient way to get a response.  Please allow 48 business hours for a response.  Please remember that this is for non-urgent requests.   It was a pleasure to see you today!  Thank you for trusting me with your gastrointestinal care!    Scott E.Candis Schatz, MD

## 2022-01-17 NOTE — Progress Notes (Signed)
HPI : Samantha [name] is a very pleasant 81 year old female with a history of A-fib, CAD and colon cancer who presents for repeat hemorrhoid banding.  She underwent banding of the left lateral hemorrhoid column on October 4th.  She had bothersome symptoms to include frequent bleeding, prolapse and soiling.  Since her banding, she reports that her hemorrhoid symptoms have essentially resolved.  She denies any problems with prolapse or soiling.  She sees scant blood every once in a while, but it is much less frequent and profuse than it was prior to the banding.  She has regular bowel movements, typically 3 per day.  Stools are usually soft, sometimes loose.  No problems with straining or hard stools.   Review of pertinent gastrointestinal problems: 1. Colon cancer; colonoscopy [name] 7 2016 done for minor rectal bleeding found a small descending colon firm polypoid lesion. This was resected with snare cautery and confirmed adenocarcinoma. She underwent robot-assisted right colectomy with [name] October 2016 and the pathologic specimen prove that there was no residual adenocarcinoma. This was a T1, N0 M0 right-sided colon cancer.  Colonoscopy January 2018 normal right sided anastomosis, single 3 mm polyp was found and removed.  It was adenomatous on biopsy.  She was recommended to have repeat colonoscopy at 3-year interval. Colonoscopy Mar 2021:  Normal ileocolonic anastomosis, no polyps, recommended to repeat colonoscopy in 5 years 2. 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 3. Symptomatic hemorrhoids:  [facility] band ligation of left lateral hemorrhoid Nov 01, 2021  Past Medical History:  Diagnosis Date   A-fib [facility])    Abnormal CT of the chest 2008   last CT4-l 2009:  . No f/u suggested    Allergy    Asthma    CAD (coronary artery disease)    a. Coronary CTA 10/16: Coronary Ca score 211, mod non-obstructive CAD with LM mild plaque (25-50%),  mid LAD 50-69%. b. Neg nuc 06/2015.   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[place]) 2016   Minimally invasive right hemicolectomy to be done    Neuromuscular disorder ([place])    FIBROMYALGIA   Ocular migraine    OSA (obstructive sleep apnea) 09/2007   dx w/ a sleep study, not on  CPAP   Osteoarthritis    Osteoporosis    Pneumonia    "double" in 2004   PONV (postoperative nausea and vomiting)    Reactive airway disease 01/29/2002   dx of pseudoasthma / vcd in 2005 and nl sprirometry History of dyspnea, 2011,  improved after several medications were changed around Question of COPD, disproved July 06, 2009 with nl pft's       Rheumatoid factor positive    Shingles 11/2009   Sleep apnea    Stroke ([place])    TIA (transient ischemic attack)    x2 - on Plavix for this   Torn rotator cuff    right worse than left, both are torn   Tumor, 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 Pernell Dupre (done after colon surgery)   CATARACT EXTRACTION W/ INTRAOCULAR LENS  IMPLANT, BILATERAL  2012   COLON SURGERY  10/2014   EYE SURGERY Bilateral    torq lens for cataracts   LEFT HEART CATH AND CORONARY ANGIOGRAPHY N/A 03/22/2016   Procedure: Left Heart Cath and Coronary Angiography;  Surgeon:  Belva Crome, MD;  Location: [place] [facility];  Service: Cardiovascular;  Laterality: N/A;   LUMBAR LAMINECTOMY/DECOMPRESSION MICRODISCECTOMY N/A 01/20/2018   Procedure: L4-5 decompression;  Surgeon: Marybelle Killings, MD;  Location: [place];  Service: Orthopedics;  Laterality: N/A;   THYROIDECTOMY, PARTIAL  1960's   TOTAL KNEE ARTHROPLASTY Right 12/04/2019   Procedure: RIGHT TOTAL KNEE ARTHROPLASTY;  Surgeon: Marybelle Killings, MD;  Location: [place];  Service: Orthopedics;  Laterality: Right;  RNFA APRIL [place]   "Laser surgery for vaginal cancer; followed by chemotherapy" (06/27/2012)   Family History  Problem Relation Age of Onset   Heart disease Father    Heart disease Mother    Lung cancer Mother    Allergies Sister    Parkinsonism Sister        possible   Asthma Sister    Asthma Paternal Grandmother    Stroke Paternal Grandmother    Heart disease Other        paternal grandparents, maternal grandparents,    Heart disease Brother    Emphysema Brother    Aneurysm Brother        x3   Kidney failure Brother    Diabetes Brother    Diabetes Brother    Stroke Brother    Stroke Maternal Grandmother    Breast cancer Neg Hx    Colon cancer Neg Hx    Heart attack Neg Hx    Esophageal cancer Neg Hx    Rectal cancer Neg Hx    Stomach cancer Neg Hx    Social History   Tobacco Use   Smoking status: Former    Packs/day: 0.25    Years: 5.00    Total pack years: 1.25    Types: Cigarettes    Quit date: 01/29/1998    Years since quitting: 23.9   Smokeless tobacco: Never   Tobacco comments:    Quit in 2001  Vaping Use   Vaping Use: Never used  Substance Use Topics   Alcohol use: No    Alcohol/week: 0.0 standard drinks of alcohol   Drug use: No   Current Outpatient Medications  Medication Sig Dispense Refill   acetaminophen (TYLENOL) 500 MG tablet Take 500 mg by mouth every 6 (six) hours as needed for mild pain.      albuterol (PROVENTIL) (2.5 MG/3ML)  0.083% nebulizer solution Take 3 mLs (2.5 mg total) by nebulization every 6 (six) hours as needed for wheezing or shortness of breath. 150 mL 0   Calcium-Magnesium-Zinc (CAL-MAG-ZINC PO) Take 1 tablet by mouth daily.     cetirizine (ZYRTEC) 10 MG tablet TAKE 1 TABLET(10 MG) BY MOUTH DAILY 90 tablet 3   cholecalciferol (  VITAMIN D) 1000 UNITS tablet Take 1,000 Units by mouth every morning.      clopidogrel (PLAVIX) 75 MG tablet TAKE 1 TABLET(75 MG) BY MOUTH DAILY 90 tablet 1   DULoxetine (CYMBALTA) 30 MG capsule Take 1 capsule (30 mg total) by mouth daily. 90 capsule 3   Erenumab-aooe (AIMOVIG) 70 MG/ML SOAJ Inject 70 mg into the skin every 30 (thirty) days. 1 mL 11   Evolocumab (REPATHA SURECLICK) 280 MG/ML SOAJ INJECT 1 PEN UNDER THE SKIN EVERY 14 DAYS 6 mL 3   famotidine (PEPCID) 40 MG tablet TAKE 1 TABLET(40 MG) BY MOUTH DAILY AS NEEDED FOR HEARTBURN OR INDIGESTION 30 tablet 5   FARXIGA 10 MG TABS tablet TAKE 1 TABLET(10 MG) BY MOUTH DAILY BEFORE BREAKFAST 30 tablet 5   glucose blood test strip Use as instructed 100 each 12   hydroxychloroquine (PLAQUENIL) 200 MG tablet Take 400 mg by mouth daily.      isosorbide mononitrate (IMDUR) 30 MG 24 hr tablet Take 3 tablets (90 mg total) by mouth daily. 270 tablet 2   Lancets (ONETOUCH ULTRASOFT) lancets Use as instructed 100 each 12   losartan (COZAAR) 25 MG tablet TAKE 1 TABLET(25 MG) BY MOUTH DAILY 90 tablet 2   nebivolol (BYSTOLIC) 10 MG tablet TAKE 1 TABLET(10 MG) BY MOUTH DAILY 90 tablet 2   nitroGLYCERIN (NITROSTAT) 0.4 MG SL tablet DISSOLVE 1 TABLET UNDER THE TONGUE EVERY 5 MINUTES AS NEEDED FOR CHEST PAIN 25 tablet 5   Polyethyl Glycol-Propyl Glycol (SYSTANE) 0.4-0.3 % GEL ophthalmic gel Place 1 application into both eyes daily as needed (for dry eyes).      pregabalin (LYRICA) 75 MG capsule TAKE 1 CAPSULE(75 MG) BY MOUTH THREE TIMES DAILY 90 capsule 5   psyllium (METAMUCIL) 58.6 % powder Take 1 packet by mouth daily.     ranolazine (RANEXA)  500 MG 12 hr tablet TAKE 1 TABLET(500 MG) BY MOUTH TWICE DAILY 180 tablet 3   Rimegepant Sulfate (NURTEC) 75 MG TBDP Take 1 tab at onset of migraine.  May repeat in 2 hrs, if needed.  Max dose: 2 tabs/day. This is a 30 day prescription. 12 tablet 11   SYMBICORT 80-4.5 MCG/ACT inhaler INHALE 2 PUFFS INTO THE LUNGS TWICE DAILY 10.2 g 5   torsemide (DEMADEX) 20 MG tablet Take one tablet by mouth every other day 45 tablet 2   VENTOLIN HFA 108 (90 Base) MCG/ACT inhaler INHALE 2 PUFFS INTO THE LUNGS EVERY 6 HOURS AS NEEDED FOR WHEEZING OR SHORTNESS OF BREATH 18 g 11   No current facility-administered medications for this visit.   Allergies  Allergen Reactions   Bactrim [Sulfamethoxazole-Trimethoprim] Other (See Comments)    See OV 09-15-13, rash-tongue swelling due to bactrim ?   Cefuroxime Axetil Hives   Oxycodone Nausea And Vomiting   Pravastatin Other (See Comments)    "muscle breakdown" with profuse sweating   Terfenadine Hives    Other reaction(s): Other (See Comments) Other reaction(s): Not available   Zocor [Simvastatin] Other (See Comments)    2012 "Muscle breakdown " with profuse sweating   Tramadol Nausea And Vomiting   Atorvastatin Other (See Comments)    Pt reports muscle aches and joint pains   Cefuroxime Other (See Comments)   Lactose Intolerance (Gi) Diarrhea   Latex     blisters   Lime Flavor [Flavoring Agent] Diarrhea   Metformin And Related Diarrhea   Oxycodone Hcl Other (See Comments)   Pravastatin Sodium Other (See Comments)   Rosuvastatin  Other (See Comments)    Pt reports "myalgias, fatigue, nosebleeds."   Rosuvastatin Calcium Other (See Comments)   Tape Other (See Comments)    blisters     Review of Systems: All systems reviewed and negative except where noted in HPI.    No results found.  Physical Exam: BP 122/80   Pulse 71   Ht 5' 2.5" (1.588 m)   Wt 206 lb (93.4 kg)   BMI 37.08 kg/m  Constitutional: Pleasant,well-developed, female in no acute  distress. HEENT: Normocephalic and atraumatic. Conjunctivae are normal. No scleral icterus. Neck supple.  Cardiovascular: Normal rate, regular rhythm.  Pulmonary/chest: Effort normal and breath sounds normal. No wheezing, rales or rhonchi. Abdominal: Soft, nondistended, nontender. Bowel sounds active throughout. There are no masses palpable. No hepatomegaly. Extremities: no edema Lymphadenopathy: No cervical adenopathy noted. Neurological: Alert and oriented to person place and time. Skin: Skin is warm and dry. No rashes noted. Psychiatric: Normal mood and affect. Behavior is normal.  CBC    Component Value Date/Time   WBC 3.5 (L) 09/29/2021 1725   RBC 4.54 09/29/2021 1725   HGB 12.9 09/29/2021 1725   HGB 12.5 10/01/2018 1036   HCT 39.7 09/29/2021 1725   HCT 37.2 10/01/2018 1036   PLT 124 (L) 09/29/2021 1725   PLT 158 10/01/2018 1036   MCV 87.4 09/29/2021 1725   MCV 86 10/01/2018 1036   MCH 28.4 09/29/2021 1725   MCHC 32.5 09/29/2021 1725   RDW 15.0 09/29/2021 1725   RDW 14.7 10/01/2018 1036   LYMPHSABS 1.1 08/24/2021 1136   LYMPHSABS 1.7 10/01/2018 1036   MONOABS 0.3 08/24/2021 1136   EOSABS 0.2 08/24/2021 1136   EOSABS 0.1 10/01/2018 1036   BASOSABS 0.0 08/24/2021 1136   BASOSABS 0.0 10/01/2018 1036    CMP     Component Value Date/Time   NA 141 09/29/2021 1725   NA 141 09/20/2020 1332   K 4.3 09/29/2021 1725   CL 107 09/29/2021 1725   CO2 26 09/29/2021 1725   GLUCOSE 98 09/29/2021 1725   BUN 21 09/29/2021 1725   BUN 26 09/20/2020 1332   CREATININE 1.14 (H) 09/29/2021 1725   CREATININE 0.84 07/06/2015 0916   CALCIUM 9.2 09/29/2021 1725   PROT 6.8 08/24/2021 1136   PROT 6.5 10/01/2018 1036   ALBUMIN 4.2 08/24/2021 1136   ALBUMIN 4.2 10/01/2018 1036   AST 17 08/24/2021 1136   ALT 14 08/24/2021 1136   ALKPHOS 51 08/24/2021 1136   BILITOT 0.5 08/24/2021 1136   BILITOT 0.3 10/01/2018 1036   GFRNONAA 49 (L) 09/29/2021 1725   GFRNONAA 73 08/14/2011 1102    GFRAA 60 07/17/2019 1235   GFRAA 84 08/14/2011 1102     ASSESSMENT AND PLAN: 81 year old female with bothersome Grade II internal hemorrhoids s/p band ligation of the left lateral hemorrhoid column, with near complete resolution of her hemorrhoid symptoms.  Her most bothersome symptoms of prolapse and fecal soiling have resolved.  She has infrequent painless scant hematochezia.   She takes Plavix for a history of TIA and CAD and is at increased risk for post-ligation bleeding as well as thromboemobolic events while holding Plavix after her banding.  Given her significant improvement in symptoms, we elected to defer further banding of her hemorrhoids.  Continue metamucil and cholestyramine, avoid straining and hard stools.  Hemorrhoids, Grade II - Defer further banding; patient to contact us if her symptoms worsen - Continue Metamucil  - Continue cholestryamine to reduce diarrhea/urgency  Nicki Reaper  Larwance Rote, MD [facility]  CC:  [name], MD

## 2022-02-06 DIAGNOSIS — M797 Fibromyalgia: Secondary | ICD-10-CM | POA: Diagnosis not present

## 2022-02-06 DIAGNOSIS — M069 Rheumatoid arthritis, unspecified: Secondary | ICD-10-CM | POA: Diagnosis not present

## 2022-02-06 DIAGNOSIS — Z8739 Personal history of other diseases of the musculoskeletal system and connective tissue: Secondary | ICD-10-CM | POA: Diagnosis not present

## 2022-02-06 DIAGNOSIS — I10 Essential (primary) hypertension: Secondary | ICD-10-CM | POA: Diagnosis not present

## 2022-02-06 DIAGNOSIS — M1990 Unspecified osteoarthritis, unspecified site: Secondary | ICD-10-CM | POA: Diagnosis not present

## 2022-02-06 DIAGNOSIS — I7300 Raynaud's syndrome without gangrene: Secondary | ICD-10-CM | POA: Diagnosis not present

## 2022-02-06 DIAGNOSIS — E669 Obesity, unspecified: Secondary | ICD-10-CM | POA: Diagnosis not present

## 2022-02-06 DIAGNOSIS — Z79899 Other long term (current) drug therapy: Secondary | ICD-10-CM | POA: Diagnosis not present

## 2022-02-06 DIAGNOSIS — M542 Cervicalgia: Secondary | ICD-10-CM | POA: Diagnosis not present

## 2022-02-06 DIAGNOSIS — E785 Hyperlipidemia, unspecified: Secondary | ICD-10-CM | POA: Diagnosis not present

## 2022-02-06 DIAGNOSIS — M79643 Pain in unspecified hand: Secondary | ICD-10-CM | POA: Diagnosis not present

## 2022-02-08 ENCOUNTER — Other Ambulatory Visit (HOSPITAL_COMMUNITY): Payer: Self-pay

## 2022-02-14 DIAGNOSIS — H9312 Tinnitus, left ear: Secondary | ICD-10-CM | POA: Diagnosis not present

## 2022-02-14 DIAGNOSIS — H9042 Sensorineural hearing loss, unilateral, left ear, with unrestricted hearing on the contralateral side: Secondary | ICD-10-CM | POA: Diagnosis not present

## 2022-02-15 ENCOUNTER — Encounter: Payer: Self-pay | Admitting: Adult Health

## 2022-02-15 ENCOUNTER — Ambulatory Visit (INDEPENDENT_AMBULATORY_CARE_PROVIDER_SITE_OTHER): Payer: Medicare Other | Admitting: Adult Health

## 2022-02-15 VITALS — BP 152/78 | HR 67 | Ht 62.5 in | Wt 209.0 lb

## 2022-02-15 DIAGNOSIS — M542 Cervicalgia: Secondary | ICD-10-CM

## 2022-02-15 DIAGNOSIS — G43709 Chronic migraine without aura, not intractable, without status migrainosus: Secondary | ICD-10-CM | POA: Diagnosis not present
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
## 2022-02-16 LAB — CBC WITH DIFFERENTIAL/PLATELET
Basophils Absolute: 0 10*3/uL (ref 0.0–0.2)
Basos: 1 %
EOS (ABSOLUTE): 0.1 10*3/uL (ref 0.0–0.4)
Eos: 4 %
Hematocrit: 40.6 % (ref 34.0–46.6)
Hemoglobin: 13.8 g/dL (ref 11.1–15.9)
Immature Grans (Abs): 0 10*3/uL (ref 0.0–0.1)
Immature Granulocytes: 0 %
Lymphocytes Absolute: 1.4 10*3/uL (ref 0.7–3.1)
Lymphs: 36 %
MCH: 28.8 pg (ref 26.6–33.0)
MCHC: 34 g/dL (ref 31.5–35.7)
MCV: 85 fL (ref 79–97)
Monocytes Absolute: 0.4 10*3/uL (ref 0.1–0.9)
Monocytes: 10 %
Neutrophils Absolute: 1.8 10*3/uL (ref 1.4–7.0)
Neutrophils: 49 %
Platelets: 150 10*3/uL (ref 150–450)
RBC: 4.79 x10E6/uL (ref 3.77–5.28)
RDW: 14.6 % (ref 11.7–15.4)
WBC: 3.7 10*3/uL (ref 3.4–10.8)

## 2022-02-19 NOTE — Addendum Note (Signed)
Addended by: Trudie Buckler on: 02/19/2022 09:13 AM   Modules accepted: Orders

## 2022-02-26 ENCOUNTER — Encounter: Payer: Self-pay | Admitting: Internal Medicine

## 2022-02-26 ENCOUNTER — Telehealth: Payer: Self-pay

## 2022-02-26 DIAGNOSIS — M542 Cervicalgia: Secondary | ICD-10-CM

## 2022-02-26 DIAGNOSIS — R519 Headache, unspecified: Secondary | ICD-10-CM

## 2022-02-26 NOTE — Progress Notes (Unsigned)
Subjective:    Patient ID: Samantha [name], female    DOB: 1940/09/08, 82 y.o.   MRN: [id number]     HPI Phallon is here for follow up of her chronic medical problems, including htn, DM w/ neuropathy, hld, fibromyalgia, asthma, RA, HFpEF, CAD, CKD, occipital neuralgia  She is still having fibromyalgia pain - lyrica helps.  The rainy cloudy days make it worse.   She has a lot of left sided head pain still   - she is less active because of this.   Medications and allergies reviewed with patient and updated if appropriate.  Current Outpatient Medications on File Prior to Visit  Medication Sig Dispense Refill   acetaminophen (TYLENOL) 500 MG tablet Take 500 mg by mouth every 6 (six) hours as needed for mild pain.      albuterol (PROVENTIL) (2.5 MG/3ML) 0.083% nebulizer solution Take 3 mLs (2.5 mg total) by nebulization every 6 (six) hours as needed for wheezing or shortness of breath. 150 mL 0   Calcium-Magnesium-Zinc (CAL-MAG-ZINC PO) Take 1 tablet by mouth daily.     cetirizine (ZYRTEC) 10 MG tablet TAKE 1 TABLET(10 MG) BY MOUTH DAILY 90 tablet 3   cholecalciferol (VITAMIN D) 1000 UNITS tablet Take 1,000 Units by mouth every morning.      clopidogrel (PLAVIX) 75 MG tablet TAKE 1 TABLET(75 MG) BY MOUTH DAILY 90 tablet 1   DULoxetine (CYMBALTA) 30 MG capsule Take 1 capsule (30 mg total) by mouth daily. 90 capsule 3   Erenumab-aooe (AIMOVIG) 70 MG/ML SOAJ Inject 70 mg into the skin every 30 (thirty) days. 1 mL 11   Evolocumab (REPATHA SURECLICK) 347 MG/ML SOAJ INJECT 1 PEN UNDER THE SKIN EVERY 14 DAYS 6 mL 3   famotidine (PEPCID) 40 MG tablet TAKE 1 TABLET(40 MG) BY MOUTH DAILY AS NEEDED FOR HEARTBURN OR INDIGESTION 30 tablet 5   FARXIGA 10 MG TABS tablet TAKE 1 TABLET(10 MG) BY MOUTH DAILY BEFORE BREAKFAST 30 tablet 5   glucose blood test strip Use as instructed 100 each 12   hydroxychloroquine (PLAQUENIL) 200 MG tablet Take 400 mg by mouth daily.      isosorbide mononitrate  (IMDUR) 30 MG 24 hr tablet Take 3 tablets (90 mg total) by mouth daily. 270 tablet 2   Lancets (ONETOUCH ULTRASOFT) lancets Use as instructed 100 each 12   losartan (COZAAR) 25 MG tablet TAKE 1 TABLET(25 MG) BY MOUTH DAILY 90 tablet 2   nebivolol (BYSTOLIC) 10 MG tablet TAKE 1 TABLET(10 MG) BY MOUTH DAILY 90 tablet 2   nitroGLYCERIN (NITROSTAT) 0.4 MG SL tablet DISSOLVE 1 TABLET UNDER THE TONGUE EVERY 5 MINUTES AS NEEDED FOR CHEST PAIN 25 tablet 5   Polyethyl Glycol-Propyl Glycol (SYSTANE) 0.4-0.3 % GEL ophthalmic gel Place 1 application into both eyes daily as needed (for dry eyes).      pregabalin (LYRICA) 75 MG capsule TAKE 1 CAPSULE(75 MG) BY MOUTH THREE TIMES DAILY 90 capsule 5   psyllium (METAMUCIL) 58.6 % powder Take 1 packet by mouth daily.     ranolazine (RANEXA) 500 MG 12 hr tablet TAKE 1 TABLET(500 MG) BY MOUTH TWICE DAILY 180 tablet 3   Rimegepant Sulfate (NURTEC) 75 MG TBDP Take 1 tab at onset of migraine.  May repeat in 2 hrs, if needed.  Max dose: 2 tabs/day. This is a 30 day prescription. 12 tablet 11   SYMBICORT 80-4.5 MCG/ACT inhaler INHALE 2 PUFFS INTO THE LUNGS TWICE DAILY 10.2 g 5  torsemide (DEMADEX) 20 MG tablet Take one tablet by mouth every other day 45 tablet 2   VENTOLIN HFA 108 (90 Base) MCG/ACT inhaler INHALE 2 PUFFS INTO THE LUNGS EVERY 6 HOURS AS NEEDED FOR WHEEZING OR SHORTNESS OF BREATH 18 g 11   No current facility-administered medications on file prior to visit.     Review of Systems  Constitutional:  Negative for fever.  Respiratory:  Positive for wheezing. Negative for cough and shortness of breath.   Cardiovascular:  Positive for palpitations (chronic). Negative for chest pain and leg swelling.  Neurological:  Positive for dizziness, light-headedness and headaches (left sided).       Objective:   Vitals:   02/27/22 1302  BP: 136/76  Pulse: 70  Temp: 98 F (36.7 C)  SpO2: 94%   BP Readings from Last 3 Encounters:  02/27/22 136/76  02/15/22  (!) 152/78  01/17/22 122/80   Wt Readings from Last 3 Encounters:  02/27/22 201 lb (91.2 kg)  02/15/22 209 lb (94.8 kg)  01/17/22 206 lb (93.4 kg)   Body mass index is 36.18 kg/m.    Physical Exam Constitutional:      General: She is not in acute distress.    Appearance: Normal appearance.  HENT:     Head: Normocephalic and atraumatic.  Eyes:     Conjunctiva/sclera: Conjunctivae normal.  Cardiovascular:     Rate and Rhythm: Normal rate and regular rhythm.     Heart sounds: Normal heart sounds. No murmur heard. Pulmonary:     Effort: Pulmonary effort is normal. No respiratory distress.     Breath sounds: Normal breath sounds. No wheezing.  Musculoskeletal:     Cervical back: Neck supple.     Right lower leg: No edema.     Left lower leg: No edema.  Lymphadenopathy:     Cervical: No cervical adenopathy.  Skin:    General: Skin is warm and dry.     Findings: No rash.  Neurological:     Mental Status: She is alert. Mental status is at baseline.  Psychiatric:        Mood and Affect: Mood normal.        Behavior: Behavior normal.        Lab Results  Component Value Date   WBC 3.7 02/15/2022   HGB 13.8 02/15/2022   HCT 40.6 02/15/2022   PLT 150 02/15/2022   GLUCOSE 98 09/29/2021   CHOL 154 08/24/2021   TRIG 95.0 08/24/2021   HDL 85.10 08/24/2021   LDLDIRECT 126.2 12/07/2010   LDLCALC 50 08/24/2021   ALT 14 08/24/2021   AST 17 08/24/2021   NA 141 09/29/2021   K 4.3 09/29/2021   CL 107 09/29/2021   CREATININE 1.14 (H) 09/29/2021   BUN 21 09/29/2021   CO2 26 09/29/2021   TSH 2.49 10/20/2020   INR 0.95 07/22/2017   HGBA1C 6.5 08/24/2021   MICROALBUR 1.2 02/21/2021     Assessment & Plan:    See Problem List for Assessment and Plan of chronic medical problems.

## 2022-02-26 NOTE — Patient Instructions (Addendum)
      Blood work was ordered.   The lab is on the first floor.    Medications changes include :   increase dose of symbicort      Return in about 6 months (around 08/28/2022) for follow up.

## 2022-02-26 NOTE — Telephone Encounter (Signed)
Error

## 2022-02-26 NOTE — Telephone Encounter (Signed)
I left a VM for the patient (as per DPR). I informed her that she needed additional blood work. The office number was provided for a return call.

## 2022-02-26 NOTE — Telephone Encounter (Signed)
-----  Message from Ward Givens, NP sent at 02/19/2022  9:13 AM EST ----- Please call patient and let her know we need her to come back in for lab work. Need BMP to check kidney function or she can go to any [facility] if that's closer

## 2022-02-27 ENCOUNTER — Ambulatory Visit (INDEPENDENT_AMBULATORY_CARE_PROVIDER_SITE_OTHER): Payer: Medicare Other | Admitting: Internal Medicine

## 2022-02-27 VITALS — BP 136/76 | HR 70 | Temp 98.0°F | Ht 62.5 in | Wt 201.0 lb

## 2022-02-27 DIAGNOSIS — G4486 Cervicogenic headache: Secondary | ICD-10-CM | POA: Diagnosis not present

## 2022-02-27 DIAGNOSIS — N1831 Chronic kidney disease, stage 3a: Secondary | ICD-10-CM | POA: Diagnosis not present

## 2022-02-27 DIAGNOSIS — J4530 Mild persistent asthma, uncomplicated: Secondary | ICD-10-CM | POA: Diagnosis not present

## 2022-02-27 DIAGNOSIS — E782 Mixed hyperlipidemia: Secondary | ICD-10-CM

## 2022-02-27 DIAGNOSIS — M069 Rheumatoid arthritis, unspecified: Secondary | ICD-10-CM | POA: Diagnosis not present

## 2022-02-27 DIAGNOSIS — E119 Type 2 diabetes mellitus without complications: Secondary | ICD-10-CM

## 2022-02-27 DIAGNOSIS — I5032 Chronic diastolic (congestive) heart failure: Secondary | ICD-10-CM

## 2022-02-27 DIAGNOSIS — I2510 Atherosclerotic heart disease of native coronary artery without angina pectoris: Secondary | ICD-10-CM | POA: Diagnosis not present

## 2022-02-27 DIAGNOSIS — K219 Gastro-esophageal reflux disease without esophagitis: Secondary | ICD-10-CM | POA: Diagnosis not present

## 2022-02-27 DIAGNOSIS — I10 Essential (primary) hypertension: Secondary | ICD-10-CM | POA: Diagnosis not present

## 2022-02-27 DIAGNOSIS — Z794 Long term (current) use of insulin: Secondary | ICD-10-CM

## 2022-02-27 DIAGNOSIS — M797 Fibromyalgia: Secondary | ICD-10-CM | POA: Diagnosis not present

## 2022-02-27 DIAGNOSIS — E1142 Type 2 diabetes mellitus with diabetic polyneuropathy: Secondary | ICD-10-CM

## 2022-02-27 DIAGNOSIS — I2583 Coronary atherosclerosis due to lipid rich plaque: Secondary | ICD-10-CM

## 2022-02-27 LAB — LIPID PANEL
Cholesterol: 151 mg/dL (ref 0–200)
HDL: 82.5 mg/dL (ref >39.00)
LDL Cholesterol: 54 mg/dL (ref 0–99)
NonHDL: 68.58
Total CHOL/HDL Ratio: 2
Triglycerides: 72 mg/dL (ref 0.0–149.0)
VLDL: 14.4 mg/dL (ref 0.0–40.0)

## 2022-02-27 LAB — HEMOGLOBIN A1C: Hgb A1c MFr Bld: 6.4 % (ref 4.6–6.5)

## 2022-02-27 LAB — COMPREHENSIVE METABOLIC PANEL
ALT: 24 U/L (ref 0–35)
AST: 19 U/L (ref 0–37)
Albumin: 4.4 g/dL (ref 3.5–5.2)
Alkaline Phosphatase: 50 U/L (ref 39–117)
BUN: 21 mg/dL (ref 6–23)
CO2: 28 mEq/L (ref 19–32)
Calcium: 9.4 mg/dL (ref 8.4–10.5)
Chloride: 103 mEq/L (ref 96–112)
Creatinine, Ser: 1.1 mg/dL (ref 0.40–1.20)
GFR: 47.69 mL/min — ABNORMAL LOW (ref >60.00)
Glucose, Bld: 99 mg/dL (ref 70–99)
Potassium: 4.2 mEq/L (ref 3.5–5.1)
Sodium: 138 mEq/L (ref 135–145)
Total Bilirubin: 0.6 mg/dL (ref 0.2–1.2)
Total Protein: 7.2 g/dL (ref 6.0–8.3)

## 2022-02-27 LAB — MICROALBUMIN / CREATININE URINE RATIO
Creatinine,U: 68.1 mg/dL
Microalb Creat Ratio: 1 mg/g (ref 0.0–30.0)
Microalb, Ur: <0.7 mg/dL (ref 0.0–1.9)

## 2022-02-27 MED ORDER — BUDESONIDE-FORMOTEROL FUMARATE 160-4.5 MCG/ACT IN AERO: 2.0000 | INHALATION_SPRAY | Freq: Two times a day (BID) | RESPIRATORY_TRACT | 3 refills | Status: DC

## 2022-02-27 NOTE — Assessment & Plan Note (Signed)
Chronic GERD controlled Continue  pepcid 40 mg daily 

## 2022-02-27 NOTE — Assessment & Plan Note (Signed)
Chronic   Lab Results  Component Value Date   HGBA1C 6.5 08/24/2021   Sugars  controlled Check A1c, urine microalbumin today Continue farxiga 10 mg daily Stressed regular exercise, diabetic diet

## 2022-02-27 NOTE — Assessment & Plan Note (Signed)
Chronic Following with Samantha [name]

## 2022-02-27 NOTE — Assessment & Plan Note (Addendum)
Chronic Left side of head Has chronic constant pain in left posterior head Has seen ortho at [facility], Dr Krista Blue Neurology has ordered MRI to evaluate further Doing art work/crafts and writing to distract her from the pain - encouraged her to continue to distract herself with these hobbies to help deal with the pain ? Muscle issue if MRI negative On lyrica - helping Continue cymbalta 30 mg daily

## 2022-02-27 NOTE — Assessment & Plan Note (Signed)
Chronic Continue lyrica 75 mg TID, cymbalta 30 mg

## 2022-02-27 NOTE — Assessment & Plan Note (Signed)
Chronic Stable cmp

## 2022-02-27 NOTE — Assessment & Plan Note (Signed)
Chronic Appears euvolemic Follows with cardiology Continue farxiga 10 mg daily, torsemide 20 mg daily

## 2022-02-27 NOTE — Assessment & Plan Note (Signed)
Chronic Check lipid panel  Continue repatha Regular exercise and healthy diet encouraged

## 2022-02-27 NOTE — Assessment & Plan Note (Addendum)
Chronic Having a lot of wheezing Not controlled Continue albuterol prn Increase symbicort to 160-4.5 mcg

## 2022-02-27 NOTE — Assessment & Plan Note (Signed)
Chronic Follows with cardiology No symptoms c/w angina Continue current medications

## 2022-02-27 NOTE — Assessment & Plan Note (Signed)
Chronic BP well controlled Continue imdur 90 mg daily, losartan 25 mg daily, bystolic 10 mg daily cmp

## 2022-03-05 NOTE — Telephone Encounter (Signed)
Pt is calling. Stated she had the additional blood work done at  Dr Billey Gosling office. Pt state she is following up on the MRI Jinny Blossom was going to order for her.

## 2022-03-05 NOTE — Telephone Encounter (Signed)
I called pt and let her know that labs in EPIC from Dr. Quay Burow.  Pt is ok to proceed with MRI neck.  She did have one back in 05-03-2021 wo contrast.  Appreciated call back.

## 2022-03-06 NOTE — Telephone Encounter (Signed)
I'm sorry it was MRI of brain back in 05-03-2021.   The labs from her pcp, Dr. Quay Burow are in epic.  You said: at [facility] about her Neck pain:    Reporting neck pain has worsened with limited range of motion.  Repeat MRI of the cervical spine with and without contrast pending blood work. In the past creatinine has been elevated if it remains elevated we will do MRI without contrast.

## 2022-03-07 ENCOUNTER — Telehealth: Payer: Self-pay | Admitting: Adult Health

## 2022-03-07 NOTE — Telephone Encounter (Signed)
medicare/[employer] NPR sent to GI [phone]

## 2022-03-07 NOTE — Addendum Note (Signed)
Addended by: Trudie Buckler on: 03/07/2022 10:17 AM   Modules accepted: Orders

## 2022-03-27 ENCOUNTER — Ambulatory Visit
Admission: RE | Admit: 2022-03-27 | Discharge: 2022-03-27 | Disposition: A | Discharge status: Home / Self Care | Payer: Medicare Other | Source: Ambulatory Visit | Attending: Adult Health | Admitting: Adult Health

## 2022-03-27 DIAGNOSIS — M542 Cervicalgia: Secondary | ICD-10-CM

## 2022-03-27 DIAGNOSIS — M4802 Spinal stenosis, cervical region: Secondary | ICD-10-CM | POA: Diagnosis not present

## 2022-03-27 DIAGNOSIS — R519 Headache, unspecified: Secondary | ICD-10-CM

## 2022-04-02 ENCOUNTER — Telehealth: Payer: Self-pay | Admitting: *Deleted

## 2022-04-02 DIAGNOSIS — M542 Cervicalgia: Secondary | ICD-10-CM

## 2022-04-02 NOTE — Telephone Encounter (Signed)
-----   Message from Ward Givens, NP sent at 03/29/2022  4:02 PM EST ----- Please call patient and let her know that her MRI of the cervical spine does show some foraminal narrowing at C6/7.  I did discuss with Dr. Krista Blue.  If medication is not controlling her symptoms we can refer to neurosurgery for an evaluation.  Please see what the patient desires.  I know in the past she said epidural steroid injections were helpful which was done by neurosurgery

## 2022-04-02 NOTE — Telephone Encounter (Signed)
Spoke with patient and discussed her MRI c-spine results as noted below by [facility] NP. Patient states she would like to be referred to neurosurgery to discuss other options, but she said she did not feel the steroid injections were very helpful. She states she had s/e to the injections which made her feel like she was dying and the relief only lasted 2 weeks. Patient does not feel the medications are working. She states they only mask the pain to make it where she can endure but they do not take it away. She takes 1 Lyrica in AM and 2 in PM. She takes Duloxetine 30 mg daily. She has pain in the base of her skull across the back. She notices her spine is curving more. She states she cannot walk as well anymore either and she trips over her feet, mostly in the mornings. 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 She feels like something moves in the very top of her neck. She states she has trouble lifting her head off the pillow in the morning. She has numbness in her hands and fingertips every morning, intermittent tingling in the left arm, ongoing all year since 2023. Lastly, she asks what would cause a "blowing up" sensation and states that 3x over the last 2 weeks she has an onset of pressure in her shoulders and skull. She feels like it is a nerve problem. She would like a referral to neurosurgery.

## 2022-04-02 NOTE — Addendum Note (Signed)
Addended by: Trudie Buckler on: 04/02/2022 04:05 PM   Modules accepted: Orders

## 2022-04-03 ENCOUNTER — Telehealth: Payer: Self-pay | Admitting: Adult Health

## 2022-04-03 ENCOUNTER — Encounter: Payer: Self-pay | Admitting: Internal Medicine

## 2022-04-03 NOTE — Progress Notes (Unsigned)
Subjective:    Patient ID: Samantha [name], female    DOB: Jul 07, 1940, 82 y.o.   MRN: [id number]      HPI Ayrian is here for No chief complaint on file.   Abdominal cramping -      Medications and allergies reviewed with patient and updated if appropriate.  Current Outpatient Medications on File Prior to Visit  Medication Sig Dispense Refill   acetaminophen (TYLENOL) 500 MG tablet Take 500 mg by mouth every 6 (six) hours as needed for mild pain.      albuterol (PROVENTIL) (2.5 MG/3ML) 0.083% nebulizer solution Take 3 mLs (2.5 mg total) by nebulization every 6 (six) hours as needed for wheezing or shortness of breath. 150 mL 0   budesonide-formoterol (SYMBICORT) 160-4.5 MCG/ACT inhaler Inhale 2 puffs into the lungs 2 (two) times daily. 1 each 3   Calcium-Magnesium-Zinc (CAL-MAG-ZINC PO) Take 1 tablet by mouth daily.     cetirizine (ZYRTEC) 10 MG tablet TAKE 1 TABLET(10 MG) BY MOUTH DAILY 90 tablet 3   cholecalciferol (VITAMIN D) 1000 UNITS tablet Take 1,000 Units by mouth every morning.      clopidogrel (PLAVIX) 75 MG tablet TAKE 1 TABLET(75 MG) BY MOUTH DAILY 90 tablet 1   DULoxetine (CYMBALTA) 30 MG capsule Take 1 capsule (30 mg total) by mouth daily. 90 capsule 3   Erenumab-aooe (AIMOVIG) 70 MG/ML SOAJ Inject 70 mg into the skin every 30 (thirty) days. 1 mL 11   Evolocumab (REPATHA SURECLICK) XX123456 MG/ML SOAJ INJECT 1 PEN UNDER THE SKIN EVERY 14 DAYS 6 mL 3   famotidine (PEPCID) 40 MG tablet TAKE 1 TABLET(40 MG) BY MOUTH DAILY AS NEEDED FOR HEARTBURN OR INDIGESTION 30 tablet 5   FARXIGA 10 MG TABS tablet TAKE 1 TABLET(10 MG) BY MOUTH DAILY BEFORE BREAKFAST 30 tablet 5   glucose blood test strip Use as instructed 100 each 12   hydroxychloroquine (PLAQUENIL) 200 MG tablet Take 400 mg by mouth daily.      isosorbide mononitrate (IMDUR) 30 MG 24 hr tablet Take 3 tablets (90 mg total) by mouth daily. 270 tablet 2   Lancets (ONETOUCH ULTRASOFT) lancets Use as instructed 100  each 12   losartan (COZAAR) 25 MG tablet TAKE 1 TABLET(25 MG) BY MOUTH DAILY 90 tablet 2   nebivolol (BYSTOLIC) 10 MG tablet TAKE 1 TABLET(10 MG) BY MOUTH DAILY 90 tablet 2   nitroGLYCERIN (NITROSTAT) 0.4 MG SL tablet DISSOLVE 1 TABLET UNDER THE TONGUE EVERY 5 MINUTES AS NEEDED FOR CHEST PAIN 25 tablet 5   Polyethyl Glycol-Propyl Glycol (SYSTANE) 0.4-0.3 % GEL ophthalmic gel Place 1 application into both eyes daily as needed (for dry eyes).      pregabalin (LYRICA) 75 MG capsule TAKE 1 CAPSULE(75 MG) BY MOUTH THREE TIMES DAILY 90 capsule 5   psyllium (METAMUCIL) 58.6 % powder Take 1 packet by mouth daily.     ranolazine (RANEXA) 500 MG 12 hr tablet TAKE 1 TABLET(500 MG) BY MOUTH TWICE DAILY 180 tablet 3   Rimegepant Sulfate (NURTEC) 75 MG TBDP Take 1 tab at onset of migraine.  May repeat in 2 hrs, if needed.  Max dose: 2 tabs/day. This is a 30 day prescription. 12 tablet 11   torsemide (DEMADEX) 20 MG tablet Take one tablet by mouth every other day 45 tablet 2   VENTOLIN HFA 108 (90 Base) MCG/ACT inhaler INHALE 2 PUFFS INTO THE LUNGS EVERY 6 HOURS AS NEEDED FOR WHEEZING OR SHORTNESS OF BREATH 18 g 11  No current facility-administered medications on file prior to visit.    Review of Systems     Objective:  There were no vitals filed for this visit. BP Readings from Last 3 Encounters:  02/27/22 136/76  02/15/22 (!) 152/78  01/17/22 122/80   Wt Readings from Last 3 Encounters:  02/27/22 201 lb (91.2 kg)  02/15/22 209 lb (94.8 kg)  01/17/22 206 lb (93.4 kg)   There is no height or weight on file to calculate BMI.    Physical Exam         Assessment & Plan:    See Problem List for Assessment and Plan of chronic medical problems.

## 2022-04-03 NOTE — Telephone Encounter (Signed)
Referral for neurosurgery fax to [facility]. Phone: [phone], Fax: [phone].

## 2022-04-04 ENCOUNTER — Encounter: Payer: Self-pay | Admitting: Internal Medicine

## 2022-04-04 ENCOUNTER — Ambulatory Visit (INDEPENDENT_AMBULATORY_CARE_PROVIDER_SITE_OTHER): Payer: Medicare Other | Admitting: Internal Medicine

## 2022-04-04 VITALS — BP 122/76 | HR 75 | Temp 98.2°F | Ht 62.5 in | Wt 207.0 lb

## 2022-04-04 DIAGNOSIS — N8110 Cystocele, unspecified: Secondary | ICD-10-CM | POA: Diagnosis not present

## 2022-04-04 DIAGNOSIS — R300 Dysuria: Secondary | ICD-10-CM | POA: Diagnosis not present

## 2022-04-04 DIAGNOSIS — N289 Disorder of kidney and ureter, unspecified: Secondary | ICD-10-CM

## 2022-04-04 DIAGNOSIS — N3001 Acute cystitis with hematuria: Secondary | ICD-10-CM

## 2022-04-04 DIAGNOSIS — M25551 Pain in right hip: Secondary | ICD-10-CM

## 2022-04-04 HISTORY — DX: Disorder of kidney and ureter, unspecified: N28.9

## 2022-04-04 LAB — POCT URINALYSIS DIPSTICK
Bilirubin, UA: NEGATIVE
Glucose, UA: POSITIVE — AB
Ketones, UA: NEGATIVE
Nitrite, UA: POSITIVE
Protein, UA: POSITIVE — AB
Spec Grav, UA: 1.025 (ref 1.010–1.025)
Urobilinogen, UA: 0.2 E.U./dL
pH, UA: 6 (ref 5.0–8.0)

## 2022-04-04 MED ORDER — AMOXICILLIN-POT CLAVULANATE 875-125 MG PO TABS: 1.0000 | ORAL_TABLET | Freq: Two times a day (BID) | ORAL | 0 refills | Status: AC

## 2022-04-04 MED ORDER — METHYLPREDNISOLONE ACETATE 80 MG/ML IJ SUSP
80.0000 mg | Freq: Once | INTRAMUSCULAR | Status: AC
  Administered 2022-04-04: 80 mg via INTRAMUSCULAR

## 2022-04-04 NOTE — Assessment & Plan Note (Signed)
Acute Her symptoms are consistent with possible bladder prolapse She will call her oncologist and see if she can get an appointment soon Has associated UTI which we will go ahead and treat with Augmentin twice daily x 7 days

## 2022-04-04 NOTE — Assessment & Plan Note (Signed)
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

## 2022-04-04 NOTE — Assessment & Plan Note (Signed)
Acute Urine dip consistent with UTI Will send urine for culture Take the antibiotic as prescribed.  Augmentin bid x 7 days Take tylenol if needed.   Increase your water intake.  Call if no improvement

## 2022-04-04 NOTE — Patient Instructions (Addendum)
     Your had a steroid injection for your hip/leg pain.     Medications changes include :   Augmentin twice daily for one week     Call Dr Julien Girt to get an appointment     Return if symptoms worsen or fail to improve.

## 2022-04-05 LAB — URINALYSIS, ROUTINE W REFLEX MICROSCOPIC
Bilirubin Urine: NEGATIVE
Ketones, ur: NEGATIVE
Nitrite: POSITIVE — AB
Specific Gravity, Urine: >=1.03 — AB (ref 1.000–1.030)
Total Protein, Urine: NEGATIVE
Urine Glucose: >=1000 — AB
Urobilinogen, UA: 0.2 (ref 0.0–1.0)
pH: 6 (ref 5.0–8.0)

## 2022-04-07 ENCOUNTER — Other Ambulatory Visit: Payer: Self-pay | Admitting: Internal Medicine

## 2022-04-07 LAB — CULTURE, URINE COMPREHENSIVE

## 2022-04-09 ENCOUNTER — Telehealth: Payer: Self-pay | Admitting: Internal Medicine

## 2022-04-09 DIAGNOSIS — N819 Female genital prolapse, unspecified: Secondary | ICD-10-CM | POA: Diagnosis not present

## 2022-04-09 DIAGNOSIS — R3915 Urgency of urination: Secondary | ICD-10-CM | POA: Diagnosis not present

## 2022-04-09 NOTE — Telephone Encounter (Signed)
FYI:  Prolapse is mild and Dr. Julien Girt recommends pelvic floor rehab and patient will do this.

## 2022-04-16 ENCOUNTER — Other Ambulatory Visit: Payer: Self-pay | Admitting: Pharmacist

## 2022-04-16 DIAGNOSIS — E785 Hyperlipidemia, unspecified: Secondary | ICD-10-CM

## 2022-04-16 DIAGNOSIS — I2510 Atherosclerotic heart disease of native coronary artery without angina pectoris: Secondary | ICD-10-CM

## 2022-04-16 MED ORDER — REPATHA SURECLICK 140 MG/ML ~~LOC~~ SOAJ: SUBCUTANEOUS | 3 refills | Status: DC

## 2022-04-23 ENCOUNTER — Ambulatory Visit (INDEPENDENT_AMBULATORY_CARE_PROVIDER_SITE_OTHER): Payer: Medicare Other | Admitting: Internal Medicine

## 2022-04-23 ENCOUNTER — Encounter: Payer: Self-pay | Admitting: Internal Medicine

## 2022-04-23 VITALS — BP 120/72 | HR 75 | Temp 98.5°F | Ht 62.5 in | Wt 201.0 lb

## 2022-04-23 DIAGNOSIS — E1142 Type 2 diabetes mellitus with diabetic polyneuropathy: Secondary | ICD-10-CM

## 2022-04-23 DIAGNOSIS — I10 Essential (primary) hypertension: Secondary | ICD-10-CM | POA: Diagnosis not present

## 2022-04-23 DIAGNOSIS — Z794 Long term (current) use of insulin: Secondary | ICD-10-CM | POA: Diagnosis not present

## 2022-04-23 DIAGNOSIS — R6889 Other general symptoms and signs: Secondary | ICD-10-CM

## 2022-04-23 LAB — POC COVID19 BINAXNOW: SARS Coronavirus 2 Ag: NEGATIVE

## 2022-04-23 LAB — POCT INFLUENZA A/B
Influenza A, POC: NEGATIVE
Influenza B, POC: NEGATIVE

## 2022-04-23 LAB — POCT RESPIRATORY SYNCYTIAL VIRUS: RSV Rapid Ag: NEGATIVE

## 2022-04-23 MED ORDER — AMOXICILLIN-POT CLAVULANATE 875-125 MG PO TABS: 1.0000 | ORAL_TABLET | Freq: Two times a day (BID) | ORAL | 0 refills | Status: AC

## 2022-04-23 NOTE — Progress Notes (Signed)
Subjective:    Patient ID: Samantha [name], female    DOB: 12/11/1940, 82 y.o.   MRN: [id number]      HPI Samantha [name] is here for  Chief Complaint  Patient presents with   URI    Coughing up mucus and runny nose and not feeling well , felt some burning in nose and blurred vision started Thursday , sore throat and having hoarseness    She is here for an acute visit for cold symptoms.   Her symptoms started 10 days ago.   She is experiencing allergy symptoms initially.  She had sneezing, nasal congestion, left ear pain, sinus pain/pressure, sore throat, cough, SOB, wheeze, headaches, increased body aches. She is fatigued.   She has tried taking allergy meds, mucinex DM, nasal congestion    Medications and allergies reviewed with patient and updated if appropriate.  Current Outpatient Medications on File Prior to Visit  Medication Sig Dispense Refill   acetaminophen (TYLENOL) 500 MG tablet Take 500 mg by mouth every 6 (six) hours as needed for mild pain.      albuterol (PROVENTIL) (2.5 MG/3ML) 0.083% nebulizer solution Take 3 mLs (2.5 mg total) by nebulization every 6 (six) hours as needed for wheezing or shortness of breath. 150 mL 0   budesonide-formoterol (SYMBICORT) 160-4.5 MCG/ACT inhaler Inhale 2 puffs into the lungs 2 (two) times daily. 1 each 3   Calcium-Magnesium-Zinc (CAL-MAG-ZINC PO) Take 1 tablet by mouth daily.     cetirizine (ZYRTEC) 10 MG tablet TAKE 1 TABLET(10 MG) BY MOUTH DAILY 90 tablet 3   cholecalciferol (VITAMIN D) 1000 UNITS tablet Take 1,000 Units by mouth every morning.      clopidogrel (PLAVIX) 75 MG tablet TAKE 1 TABLET(75 MG) BY MOUTH DAILY 90 tablet 1   DULoxetine (CYMBALTA) 30 MG capsule Take 1 capsule (30 mg total) by mouth daily. 90 capsule 3   Erenumab-aooe (AIMOVIG) 70 MG/ML SOAJ Inject 70 mg into the skin every 30 (thirty) days. 1 mL 11   Evolocumab (REPATHA SURECLICK) [id number] MG/ML SOAJ INJECT 1 PEN UNDER THE SKIN EVERY 14 DAYS 6 mL 3    famotidine (PEPCID) 40 MG tablet TAKE 1 TABLET(40 MG) BY MOUTH DAILY AS NEEDED FOR HEARTBURN OR INDIGESTION 30 tablet 5   FARXIGA 10 MG TABS tablet TAKE 1 TABLET(10 MG) BY MOUTH DAILY BEFORE BREAKFAST 30 tablet 5   glucose blood test strip Use as instructed 100 each 12   hydroxychloroquine (PLAQUENIL) 200 MG tablet Take 400 mg by mouth daily.      isosorbide mononitrate (IMDUR) 30 MG 24 hr tablet Take 3 tablets (90 mg total) by mouth daily. 270 tablet 2   Lancets (ONETOUCH ULTRASOFT) lancets Use as instructed 100 each 12   losartan (COZAAR) 25 MG tablet TAKE 1 TABLET(25 MG) BY MOUTH DAILY 90 tablet 2   nebivolol (BYSTOLIC) 10 MG tablet TAKE 1 TABLET(10 MG) BY MOUTH DAILY 90 tablet 2   nitroGLYCERIN (NITROSTAT) 0.4 MG SL tablet DISSOLVE 1 TABLET UNDER THE TONGUE EVERY 5 MINUTES AS NEEDED FOR CHEST PAIN 25 tablet 5   Polyethyl Glycol-Propyl Glycol (SYSTANE) 0.4-0.3 % GEL ophthalmic gel Place 1 application into both eyes daily as needed (for dry eyes).      pregabalin (LYRICA) 75 MG capsule TAKE 1 CAPSULE(75 MG) BY MOUTH THREE TIMES DAILY 90 capsule 5   psyllium (METAMUCIL) 58.6 % powder Take 1 packet by mouth daily.     ranolazine (RANEXA) 500 MG 12 hr tablet TAKE 1 TABLET(500  MG) BY MOUTH TWICE DAILY 180 tablet 3   Rimegepant Sulfate (NURTEC) 75 MG TBDP Take 1 tab at onset of migraine.  May repeat in 2 hrs, if needed.  Max dose: 2 tabs/day. This is a 30 day prescription. 12 tablet 11   torsemide (DEMADEX) 20 MG tablet Take one tablet by mouth every other day 45 tablet 2   VENTOLIN HFA 108 (90 Base) MCG/ACT inhaler INHALE 2 PUFFS INTO THE LUNGS EVERY 6 HOURS AS NEEDED FOR WHEEZING OR SHORTNESS OF BREATH 18 g 11   No current facility-administered medications on file prior to visit.    Review of Systems  Constitutional:  Positive for chills (one day), diaphoresis (one day) and fatigue. Negative for fever.  HENT:  Positive for congestion, ear pain (left ear pain x 1 days), postnasal drip, sinus  pressure, sinus pain, sneezing and sore throat.   Eyes:  Positive for discharge and redness.  Respiratory:  Positive for cough (bring up phlegm from PND - yellow in color), shortness of breath and wheezing.   Gastrointestinal:  Negative for diarrhea and nausea.  Musculoskeletal:  Positive for myalgias.  Neurological:  Positive for headaches.       Objective:   Vitals:   04/23/22 1411  BP: 120/72  Pulse: 75  Temp: 98.5 F (36.9 C)  SpO2: 98%   BP Readings from Last 3 Encounters:  04/23/22 120/72  04/04/22 122/76  02/27/22 136/76   Wt Readings from Last 3 Encounters:  04/23/22 201 lb (91.2 kg)  04/04/22 207 lb (93.9 kg)  02/27/22 201 lb (91.2 kg)   Body mass index is 36.18 kg/m.    Physical Exam Constitutional:      General: She is not in acute distress.    Appearance: Normal appearance. She is not ill-appearing.  HENT:     Head: Normocephalic and atraumatic.     Right Ear: Tympanic membrane, ear canal and external ear normal.     Left Ear: Tympanic membrane, ear canal and external ear normal.     Mouth/Throat:     Mouth: Mucous membranes are moist.     Pharynx: No oropharyngeal exudate or posterior oropharyngeal erythema.  Eyes:     Conjunctiva/sclera: Conjunctivae normal.  Cardiovascular:     Rate and Rhythm: Normal rate and regular rhythm.  Pulmonary:     Effort: Pulmonary effort is normal. No respiratory distress.     Breath sounds: Normal breath sounds. No wheezing or rales.  Musculoskeletal:     Cervical back: Neck supple. No tenderness.  Lymphadenopathy:     Cervical: No cervical adenopathy.  Skin:    General: Skin is warm and dry.  Neurological:     Mental Status: She is alert.            Assessment & Plan:    See Problem List for Assessment and Plan of chronic medical problems.

## 2022-04-23 NOTE — Assessment & Plan Note (Signed)
Chronic BP well controlled Continue imdur 90 mg daily, losartan 25 mg daily, bystolic 10 mg daily

## 2022-04-23 NOTE — Patient Instructions (Addendum)
        Medications changes include :   Augmentin twice a day for 10 days     Return if symptoms worsen or fail to improve.  

## 2022-04-23 NOTE — Assessment & Plan Note (Signed)
Chronic Sugars are well controlled Continue farxiga 10 mg daily

## 2022-04-25 ENCOUNTER — Other Ambulatory Visit: Payer: Self-pay | Admitting: Internal Medicine

## 2022-04-26 ENCOUNTER — Other Ambulatory Visit: Payer: Self-pay | Admitting: Neurology

## 2022-05-07 ENCOUNTER — Telehealth: Payer: Self-pay | Admitting: Adult Health

## 2022-05-07 NOTE — Telephone Encounter (Signed)
Pt is requesting a refill for pregabalin (LYRICA) 75 MG capsule  .  Pharmacy: [facility] [phone]

## 2022-05-07 NOTE — Telephone Encounter (Signed)
Please call pt back. She was referred to Neurosurgery. She should follow up with that office on refill request

## 2022-05-08 ENCOUNTER — Telehealth: Payer: Self-pay | Admitting: Internal Medicine

## 2022-05-08 NOTE — Telephone Encounter (Signed)
Prescription Request  05/08/2022  LOV: 04/23/2022  What is the name of the medication or equipment? pregabalin  Have you contacted your pharmacy to request a refill? Yes   Which pharmacy would you like this sent to?  [id number] - [place], [place] - [address] AT [facility] OF [address] [place] [address] Phone: [phone] Fax: [phone]    Patient notified that their request is being sent to the clinical staff for review and that they should receive a response within 2 business days.   Please advise at Mobile [phone] (mobile)

## 2022-05-09 ENCOUNTER — Ambulatory Visit: Payer: Medicare Other | Admitting: Podiatry

## 2022-05-09 MED ORDER — PREGABALIN 75 MG PO CAPS: ORAL_CAPSULE | ORAL | 5 refills | Status: DC

## 2022-05-14 DIAGNOSIS — M79643 Pain in unspecified hand: Secondary | ICD-10-CM | POA: Diagnosis not present

## 2022-05-14 DIAGNOSIS — M542 Cervicalgia: Secondary | ICD-10-CM | POA: Diagnosis not present

## 2022-05-14 DIAGNOSIS — M069 Rheumatoid arthritis, unspecified: Secondary | ICD-10-CM | POA: Diagnosis not present

## 2022-05-14 DIAGNOSIS — E785 Hyperlipidemia, unspecified: Secondary | ICD-10-CM | POA: Diagnosis not present

## 2022-05-14 DIAGNOSIS — M25512 Pain in left shoulder: Secondary | ICD-10-CM | POA: Diagnosis not present

## 2022-05-14 DIAGNOSIS — Z79899 Other long term (current) drug therapy: Secondary | ICD-10-CM | POA: Diagnosis not present

## 2022-05-14 DIAGNOSIS — M797 Fibromyalgia: Secondary | ICD-10-CM | POA: Diagnosis not present

## 2022-05-14 DIAGNOSIS — M1990 Unspecified osteoarthritis, unspecified site: Secondary | ICD-10-CM | POA: Diagnosis not present

## 2022-05-14 DIAGNOSIS — I7300 Raynaud's syndrome without gangrene: Secondary | ICD-10-CM | POA: Diagnosis not present

## 2022-05-14 DIAGNOSIS — I10 Essential (primary) hypertension: Secondary | ICD-10-CM | POA: Diagnosis not present

## 2022-05-14 NOTE — Therapy (Deleted)
OUTPATIENT PHYSICAL THERAPY FEMALE PELVIC EVALUATION   Patient Name: Samantha [name] MRN: [id number] DOB:1940/02/04, 82 y.o., female Today's Date: 05/14/2022  END OF SESSION:   Past Medical History:  Diagnosis Date   A-fib [facility])    Abnormal CT of the chest 2008   last CT4-l 2009:  . No f/u suggested    Allergy    Asthma    CAD (coronary artery disease)    a. Coronary CTA 10/16: Coronary Ca score 211, mod non-obstructive CAD with LM mild plaque (25-50%), mid LAD 50-69%. b. Neg nuc 06/2015.   Cataract    BILATERAL-REMOVED   Chronic diastolic CHF (congestive heart failure) (HCC)    Collagen vascular disease (HCC)    "arterial sclerosis" per pt   Complication of anesthesia    trouble waking up   Fibromyalgia    GERD (gastroesophageal reflux disease)    H/O hiatal hernia    Hearing loss    left ear   Heart murmur    Hyperlipidemia    Hypertension    Kidney disease 04/04/2022   Malignant neoplasm of ascending colon (HCC) 2016   Minimally invasive right hemicolectomy to be done    Neuromuscular disorder (HCC)    FIBROMYALGIA   Ocular migraine    OSA (obstructive sleep apnea) 09/2007   dx w/ a sleep study, not on  CPAP   Osteoarthritis    Osteoporosis    Pneumonia    "double" in 2004   PONV (postoperative nausea and vomiting)    Reactive airway disease 01/29/2002   dx of pseudoasthma / vcd in 2005 and nl sprirometry History of dyspnea, 2011,  improved after several medications were changed around Question of COPD, disproved July 06, 2009 with nl pft's       Rheumatoid factor positive    Shingles 11/2009   Sleep apnea    Stroke (HCC)    TIA (transient ischemic attack)    x2 - on Plavix for this   Torn rotator cuff    right worse than left, both are torn   Tumor, 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   Type II diabetes mellitus (HCC)    Vaginal cancer (HCC) 1994   Vaginal dysplasia    Past Surgical History:  Procedure Laterality Date   ABDOMINAL  HYSTERECTOMY  1980   NO oophorectomy per pt    ANTERIOR CERVICAL DECOMP/DISCECTOMY FUSION  2001   C 3, C4 and C5 plate and screws   BREAST BIOPSY Right 1999   BUNIONECTOMY Left ~ 1977   CARDIAC CATHETERIZATION     2018 By Dr. Garnette Scheuermann (done after colon surgery)   CATARACT EXTRACTION W/ INTRAOCULAR LENS  IMPLANT, BILATERAL  2012   COLON SURGERY  10/2014   EYE SURGERY Bilateral    torq lens for cataracts   LEFT HEART CATH AND CORONARY ANGIOGRAPHY N/A 03/22/2016   Procedure: Left Heart Cath and Coronary Angiography;  Surgeon: Lyn Records, MD;  Location[facility] INVASIVE CV LAB;  Service: Cardiovascular;  Laterality: N/A;   LUMBAR LAMINECTOMY/DECOMPRESSION MICRODISCECTOMY N/A 01/20/2018   Procedure: L4-5 decompression;  Surgeon: Eldred Manges, MD;  Location: [facility] OR;  Service: Orthopedics;  Laterality: N/A;   THYROIDECTOMY, PARTIAL  1960's   TOTAL KNEE ARTHROPLASTY Right 12/04/2019   Procedure: RIGHT TOTAL KNEE ARTHROPLASTY;  Surgeon: Eldred Manges, MD;  Location: [facility] OR;  Service: Orthopedics;  Laterality: Right;  RNFA APRIL PLEASE   VAGINAL MASS EXCISION  1994   "Laser  surgery for vaginal cancer; followed by chemotherapy" (06/27/2012)   Patient Active Problem List   Diagnosis Date Noted   Kidney disease 04/04/2022   Pain of right hip 04/04/2022   Female bladder prolapse 04/04/2022   Tinnitus of left ear 02/14/2022   Dysplasia of vagina 01/10/2022   Right calf pain 06/16/2021   Neck pain 04/17/2021   Cervicogenic headache 04/17/2021   Chronic migraine w/o aura w/o status migrainosus, not intractable 04/17/2021   Urinary frequency 02/21/2021   CKD (chronic kidney disease) stage 3, GFR 30-59 ml/min 02/20/2021   Spinal stenosis of cervical region 02/16/2021   Rheumatoid arthritis (HCC) - Dr Kathi Ludwig 10/20/2020   Neuropathy 10/19/2020   Vitamin D deficiency 04/13/2020   S/P TKR (total knee replacement), 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   Aortic atherosclerosis 11/03/2019   Posterior vitreous detachment of  right eye 07/15/2019   Posterior vitreous detachment of left eye 07/15/2019   Recurrent corneal erosion, 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   Raynaud phenomenon 05/06/2019   Trigger finger, left ring finger 03/18/2019   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 01/28/2018   GERD (gastroesophageal reflux disease) 10/23/2017   Sacral back pain 10/16/2017   Epistaxis 07/23/2017   Chronic left upper quadrant pain 07/23/2017   Poor balance 03/14/2017   Angina pectoris 03/22/2016   Hypertensive heart disease 07/07/2015   Essential hypertension 07/07/2015   Lightheadedness 07/07/2015   Plantar fasciitis, left 03/22/2015   Coronary artery disease due to lipid rich plaque 01/05/2015   Chronic diastolic CHF (congestive heart failure), NYHA class 2 01/05/2015   Malignant neoplasm of ascending colon  pT1, pN0, rM0 s/p robotic colectomy 11/11/2014 11/11/2014   Migraine (Ocular) 01/05/2014   VBI (vertebrobasilar insufficiency) 08/22/2012   TIA (transient ischemic attack) 06/27/2012   DJD (degenerative joint disease) 02/02/2011   Varicose veins of legs 06/06/2010   Palpitations 11/23/2008   Acute cystitis 09/28/2008   Fibromyalgia 08/15/2007   DM II (diabetes mellitus, type II), w/ neuropathy 05/21/2006   Hyperlipidemia 05/21/2006   Asthma 01/29/2002    PCP:    Pincus Sanes, MD    REFERRING PROVIDER: Zelphia Cairo, MD   REFERRING DIAG: N39.46 (ICD-10-CM) - Mixed incontinence   THERAPY DIAG:  No diagnosis found.  Rationale for Evaluation and Treatment: Rehabilitation  ONSET DATE: ***  SUBJECTIVE:                                                                                                                                                                                           SUBJECTIVE STATEMENT: *** Fluid intake: {Yes/No:304960894}   PAIN:  Are you having pain? {yes/no:20286} NPRS scale: ***/  10 Pain location: {pelvic pain location:27098}  Pain  type: {type:313116} Pain description: {PAIN DESCRIPTION:21022940}   Aggravating factors: *** Relieving factors: ***  PRECAUTIONS: {Therapy precautions:24002}  WEIGHT BEARING RESTRICTIONS: {Yes ***/No:24003}  FALLS:  Has patient fallen in last 6 months? {fallsyesno:27318}  LIVING ENVIRONMENT: Lives with: {OPRC lives with:25569::"lives with their family"} Lives in: {Lives in:25570} Stairs: {opstairs:27293} Has following equipment at home: {Assistive devices:23999}  OCCUPATION: ***  PLOF: {PLOF:24004}  PATIENT GOALS: ***  PERTINENT HISTORY:  PMH vaginal cancer and hysterectomy, CHF, TIA, RTKA, GERD, fibromyalgia Sexual abuse: {Yes/No:304960894}  BOWEL MOVEMENT: Pain with bowel movement: {yes/no:20286} Type of bowel movement:{PT BM type:27100} Fully empty rectum: {Yes/No:304960894} Leakage: {Yes/No:304960894} Pads: {Yes/No:304960894} Fiber supplement: {Yes/No:304960894}  URINATION: Pain with urination: {yes/no:20286} Fully empty bladder: {Yes/No:304960894} Stream: {PT urination:27102} Urgency: {Yes/No:304960894} Frequency: *** Leakage: {PT leakage:27103} Pads: {Yes/No:304960894}  INTERCOURSE: Pain with intercourse: {pain with intercourse PA:27099} Ability to have vaginal penetration:  {Yes/No:304960894} Climax: *** Marinoff Scale: ***/3  PREGNANCY: Vaginal deliveries *** Tearing {Yes***/No:304960894} C-section deliveries *** Currently pregnant {Yes***/No:304960894}  PROLAPSE: {PT prolapse:27101}   OBJECTIVE:   DIAGNOSTIC FINDINGS:  ***  PATIENT SURVEYS:  {rehab surveys:24030}  PFIQ-7 ***  COGNITION: Overall cognitive status: {cognition:24006}     SENSATION: Light touch: {intact/deficits:24005} Proprioception: {intact/deficits:24005}  MUSCLE LENGTH: Hamstrings: Right *** deg; Left *** deg Thomas test: Right *** deg; Left *** deg  LUMBAR SPECIAL TESTS:  {lumbar special test:25242}  FUNCTIONAL TESTS:  {Functional  tests:24029}  GAIT: Distance walked: *** Assistive device utilized: {Assistive devices:23999} Level of assistance: {Levels of assistance:24026} Comments: ***  POSTURE: {posture:25561}  PELVIC ALIGNMENT:  LUMBARAROM/PROM:  A/PROM A/PROM  eval  Flexion   Extension   Right lateral flexion   Left lateral flexion   Right rotation   Left rotation    (Blank rows = not tested)  LOWER EXTREMITY ROM:  {AROM/PROM:27142} ROM Right eval Left eval  Hip flexion    Hip extension    Hip abduction    Hip adduction    Hip internal rotation    Hip external rotation    Knee flexion    Knee extension    Ankle dorsiflexion    Ankle plantarflexion    Ankle inversion    Ankle eversion     (Blank rows = not tested)  LOWER EXTREMITY MMT:  MMT Right eval Left eval  Hip flexion    Hip extension    Hip abduction    Hip adduction    Hip internal rotation    Hip external rotation    Knee flexion    Knee extension    Ankle dorsiflexion    Ankle plantarflexion    Ankle inversion    Ankle eversion     PALPATION:   General  ***                External Perineal Exam ***                             Internal Pelvic Floor ***  Patient confirms identification and approves PT to assess internal pelvic floor and treatment {yes/no:20286}  PELVIC MMT:   MMT eval  Vaginal   Internal Anal Sphincter   External Anal Sphincter   Puborectalis   Diastasis Recti   (Blank rows = not tested)        TONE: ***  PROLAPSE: ***  TODAY'S TREATMENT:  DATE: ***  EVAL ***   PATIENT EDUCATION:  Education details: *** Person educated: {Person educated:25204} Education method: {Education Method:25205} Education comprehension: {Education Comprehension:25206}  HOME EXERCISE PROGRAM: ***  ASSESSMENT:  CLINICAL IMPRESSION: Patient is a *** y.o. *** who was seen  today for physical therapy evaluation and treatment for ***.   OBJECTIVE IMPAIRMENTS: {opptimpairments:25111}.   ACTIVITY LIMITATIONS: {activitylimitations:27494}  PARTICIPATION LIMITATIONS: {participationrestrictions:25113}  PERSONAL FACTORS: {Personal factors:25162} are also affecting patient's functional outcome.   REHAB POTENTIAL: {rehabpotential:25112}  CLINICAL DECISION MAKING: {clinical decision making:25114}  EVALUATION COMPLEXITY: {Evaluation complexity:25115}   GOALS: Goals reviewed with patient? {yes/no:20286}  SHORT TERM GOALS: Target date: ***  *** Baseline: Goal status: {GOALSTATUS:25110}  2.  *** Baseline:  Goal status: {GOALSTATUS:25110}  3.  *** Baseline:  Goal status: {GOALSTATUS:25110}  4.  *** Baseline:  Goal status: {GOALSTATUS:25110}  5.  *** Baseline:  Goal status: {GOALSTATUS:25110}  6.  *** Baseline:  Goal status: {GOALSTATUS:25110}  LONG TERM GOALS: Target date: ***  *** Baseline:  Goal status: {GOALSTATUS:25110}  2.  *** Baseline:  Goal status: {GOALSTATUS:25110}  3.  *** Baseline:  Goal status: {GOALSTATUS:25110}  4.  *** Baseline:  Goal status: {GOALSTATUS:25110}  5.  *** Baseline:  Goal status: {GOALSTATUS:25110}  6.  *** Baseline:  Goal status: {GOALSTATUS:25110}  PLAN:  PT FREQUENCY: {rehab frequency:25116}  PT DURATION: {rehab duration:25117}  PLANNED INTERVENTIONS: {rehab planned interventions:25118::"Therapeutic exercises","Therapeutic activity","Neuromuscular re-education","Balance training","Gait training","Patient/Family education","Self Care","Joint mobilization"}  PLAN FOR NEXT SESSION: ***   Brayton Caves Karma Hiney, PT 05/14/2022, 3:50 PM

## 2022-05-15 ENCOUNTER — Ambulatory Visit: Payer: Medicare Other | Attending: Obstetrics and Gynecology | Admitting: Physical Therapy

## 2022-05-21 ENCOUNTER — Encounter: Payer: Self-pay | Admitting: Internal Medicine

## 2022-05-21 DIAGNOSIS — J309 Allergic rhinitis, unspecified: Secondary | ICD-10-CM | POA: Insufficient documentation

## 2022-05-21 NOTE — Patient Instructions (Signed)
      You received a steroid injection today    Medications changes include :   doxycycline, prednisone taper starting tomorrow tomorrow with breakfast  Use the nebulizer three times a day.     Return if symptoms worsen or fail to improve.

## 2022-05-21 NOTE — Progress Notes (Unsigned)
Subjective:    Patient ID: Samantha [name], female    DOB: [birth year], 82 y.o.   MRN: [id number]      HPI Atha is here for No chief complaint on file.    Allergies triggering asthma -    Has albuterol neb/inhaler Symbicort 160-4.5 Zyrtec   ? Singulair, flonase, astelin/dymista,   Medications and allergies reviewed with patient and updated if appropriate.  Current Outpatient Medications on File Prior to Visit  Medication Sig Dispense Refill   acetaminophen (TYLENOL) 500 MG tablet Take 500 mg by mouth every 6 (six) hours as needed for mild pain.      albuterol (PROVENTIL) (2.5 MG/3ML) 0.083% nebulizer solution Take 3 mLs (2.5 mg total) by nebulization every 6 (six) hours as needed for wheezing or shortness of breath. 150 mL 0   budesonide-formoterol (SYMBICORT) 160-4.5 MCG/ACT inhaler Inhale 2 puffs into the lungs 2 (two) times daily. 1 each 3   Calcium-Magnesium-Zinc (CAL-MAG-ZINC PO) Take 1 tablet by mouth daily.     cetirizine (ZYRTEC) 10 MG tablet TAKE 1 TABLET(10 MG) BY MOUTH DAILY 90 tablet 3   cholecalciferol (VITAMIN D) 1000 UNITS tablet Take 1,000 Units by mouth every morning.      clopidogrel (PLAVIX) 75 MG tablet TAKE 1 TABLET(75 MG) BY MOUTH DAILY 90 tablet 1   dapagliflozin propanediol (FARXIGA) 10 MG TABS tablet TAKE 1 TABLET(10 MG) BY MOUTH DAILY BEFORE BREAKFAST 30 tablet 5   DULoxetine (CYMBALTA) 30 MG capsule Take 1 capsule (30 mg total) by mouth daily. 90 capsule 3   Erenumab-aooe (AIMOVIG) 70 MG/ML SOAJ Inject 70 mg into the skin every 30 (thirty) days. 1 mL 11   Evolocumab (REPATHA SURECLICK) 140 MG/ML SOAJ INJECT 1 PEN UNDER THE SKIN EVERY 14 DAYS 6 mL 3   famotidine (PEPCID) 40 MG tablet TAKE 1 TABLET(40 MG) BY MOUTH DAILY AS NEEDED FOR HEARTBURN OR INDIGESTION 30 tablet 5   glucose blood test strip Use as instructed 100 each 12   hydroxychloroquine (PLAQUENIL) 200 MG tablet Take 400 mg by mouth daily.      isosorbide mononitrate (IMDUR) 30 MG  24 hr tablet Take 3 tablets (90 mg total) by mouth daily. 270 tablet 2   Lancets (ONETOUCH ULTRASOFT) lancets Use as instructed 100 each 12   losartan (COZAAR) 25 MG tablet TAKE 1 TABLET(25 MG) BY MOUTH DAILY 90 tablet 2   nebivolol (BYSTOLIC) 10 MG tablet TAKE 1 QIONGE(95 MG) BY MOUTH DAILY 90 tablet 2   nitroGLYCERIN (NITROSTAT) 0.4 MG SL tablet DISSOLVE 1 TABLET UNDER THE TONGUE EVERY 5 MINUTES AS NEEDED FOR CHEST PAIN 25 tablet 5   Polyethyl Glycol-Propyl Glycol (SYSTANE) 0.4-0.3 % GEL ophthalmic gel Place 1 application into both eyes daily as needed (for dry eyes).      pregabalin (LYRICA) 75 MG capsule TAKE 1 CAPSULE(75 MG) BY MOUTH THREE TIMES DAILY 90 capsule 5   psyllium (METAMUCIL) 58.6 % powder Take 1 packet by mouth daily.     ranolazine (RANEXA) 500 MG 12 hr tablet TAKE 1 TABLET(500 MG) BY MOUTH TWICE DAILY 180 tablet 3   Rimegepant Sulfate (NURTEC) 75 MG TBDP Take 1 tab at onset of migraine.  May repeat in 2 hrs, if needed.  Max dose: 2 tabs/day. This is a 30 day prescription. 12 tablet 11   torsemide (DEMADEX) 20 MG tablet Take one tablet by mouth every other day 45 tablet 2   VENTOLIN HFA 108 (90 Base) MCG/ACT inhaler INHALE 2 PUFFS INTO  THE LUNGS EVERY 6 HOURS AS NEEDED FOR WHEEZING OR SHORTNESS OF BREATH 18 g 11   No current facility-administered medications on file prior to visit.    Review of Systems     Objective:  There were no vitals filed for this visit. BP Readings from Last 3 Encounters:  04/23/22 120/72  04/04/22 122/76  02/27/22 136/76   Wt Readings from Last 3 Encounters:  04/23/22 201 lb (91.2 kg)  04/04/22 207 lb (93.9 kg)  02/27/22 201 lb (91.2 kg)   There is no height or weight on file to calculate BMI.    Physical Exam         Assessment & Plan:    See Problem List for Assessment and Plan of chronic medical problems.

## 2022-05-22 ENCOUNTER — Telehealth: Payer: Self-pay

## 2022-05-22 ENCOUNTER — Ambulatory Visit (INDEPENDENT_AMBULATORY_CARE_PROVIDER_SITE_OTHER): Payer: Medicare Other | Admitting: Internal Medicine

## 2022-05-22 VITALS — BP 128/70 | HR 70 | Temp 98.1°F | Ht 62.5 in | Wt 204.0 lb

## 2022-05-22 DIAGNOSIS — E1142 Type 2 diabetes mellitus with diabetic polyneuropathy: Secondary | ICD-10-CM | POA: Diagnosis not present

## 2022-05-22 DIAGNOSIS — J069 Acute upper respiratory infection, unspecified: Secondary | ICD-10-CM | POA: Diagnosis not present

## 2022-05-22 DIAGNOSIS — I10 Essential (primary) hypertension: Secondary | ICD-10-CM

## 2022-05-22 DIAGNOSIS — J4530 Mild persistent asthma, uncomplicated: Secondary | ICD-10-CM | POA: Diagnosis not present

## 2022-05-22 DIAGNOSIS — J4541 Moderate persistent asthma with (acute) exacerbation: Secondary | ICD-10-CM | POA: Diagnosis not present

## 2022-05-22 DIAGNOSIS — Z794 Long term (current) use of insulin: Secondary | ICD-10-CM | POA: Diagnosis not present

## 2022-05-22 DIAGNOSIS — J45901 Unspecified asthma with (acute) exacerbation: Secondary | ICD-10-CM | POA: Insufficient documentation

## 2022-05-22 DIAGNOSIS — J309 Allergic rhinitis, unspecified: Secondary | ICD-10-CM | POA: Diagnosis not present

## 2022-05-22 MED ORDER — METHYLPREDNISOLONE ACETATE 80 MG/ML IJ SUSP
80.0000 mg | Freq: Once | INTRAMUSCULAR | Status: AC
  Administered 2022-05-22: 80 mg via INTRAMUSCULAR

## 2022-05-22 MED ORDER — DOXYCYCLINE HYCLATE 100 MG PO TABS: 100.0000 mg | ORAL_TABLET | Freq: Two times a day (BID) | ORAL | 0 refills | Status: AC

## 2022-05-22 MED ORDER — PREDNISONE 10 MG PO TABS: ORAL_TABLET | ORAL | 0 refills | Status: DC

## 2022-05-22 NOTE — Assessment & Plan Note (Signed)
Chronic Currently not controlled, but likely related to URI and asthma exacerbation Continue Zyrtec daily Continue allergy medications Symbicort twice daily, albuterol nebulizer 3 times daily while acutely sick Depo-Medrol 80 mg IM x 1 with prednisone starting tomorrow

## 2022-05-22 NOTE — Assessment & Plan Note (Signed)
Chronic Blood pressure is controlled Continue Imdur 90 mg daily, losartan 25 mg daily and Bystolic 10 mg daily Stressed to remain hydrated while sick

## 2022-05-22 NOTE — Assessment & Plan Note (Signed)
Acute Concern for bacterial cause Also having flare of seasonal allergies and asthma exacerbation that is moderate and persistent Diffuse wheeze on exam, shortness of breath, chest tightness Doxycycline 100 mg twice daily x 10 days Prednisone taper starting tomorrow Depo-Medrol 80 mg IM x 1 today Continue Symbicort twice daily, use nebulizer treatment 3 times a day Continue Mucinex and over-the-counter cold medications for symptom relief Call if no improvement

## 2022-05-22 NOTE — Assessment & Plan Note (Signed)
Chronic Lab Results  Component Value Date   HGBA1C 6.4 02/27/2022   Sugars are well-controlled-think the benefits of steroids outweigh the risks Continue Farxiga 10 mg daily

## 2022-05-22 NOTE — Assessment & Plan Note (Signed)
Acute  Related to allergies and probable URI Diffuse wheezing on exam, shortness of breath, chest tightness, productive cough Depo-Medrol 80 mg IM x 1 Prednisone taper starting tomorrow Doxycycline 100 mg twice daily x 10 days Continue Symbicort twice daily Advised using nebulizer 3 times a day Can continue Mucinex Call if no improvement

## 2022-05-23 DIAGNOSIS — Z6837 Body mass index (BMI) 37.0-37.9, adult: Secondary | ICD-10-CM | POA: Diagnosis not present

## 2022-05-23 DIAGNOSIS — M5481 Occipital neuralgia: Secondary | ICD-10-CM | POA: Diagnosis not present

## 2022-05-23 NOTE — Telephone Encounter (Signed)
Faxed last labs to [phone] (Dr. Haydee Salter)...Samantha [name]

## 2022-05-28 ENCOUNTER — Other Ambulatory Visit: Payer: Self-pay | Admitting: *Deleted

## 2022-05-28 DIAGNOSIS — R079 Chest pain, unspecified: Secondary | ICD-10-CM

## 2022-05-28 DIAGNOSIS — R002 Palpitations: Secondary | ICD-10-CM

## 2022-05-28 MED ORDER — ISOSORBIDE MONONITRATE ER 30 MG PO TB24: 90.0000 mg | ORAL_TABLET | Freq: Every day | ORAL | 0 refills | Status: DC

## 2022-06-02 ENCOUNTER — Other Ambulatory Visit: Payer: Self-pay | Admitting: Neurology

## 2022-07-04 ENCOUNTER — Other Ambulatory Visit: Payer: Self-pay

## 2022-07-04 ENCOUNTER — Ambulatory Visit: Payer: Medicare Other | Attending: Obstetrics and Gynecology | Admitting: Physical Therapy

## 2022-07-04 DIAGNOSIS — M6281 Muscle weakness (generalized): Secondary | ICD-10-CM | POA: Diagnosis not present

## 2022-07-04 DIAGNOSIS — R279 Unspecified lack of coordination: Secondary | ICD-10-CM | POA: Insufficient documentation

## 2022-07-04 DIAGNOSIS — M62838 Other muscle spasm: Secondary | ICD-10-CM | POA: Insufficient documentation
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
## 2022-07-11 ENCOUNTER — Other Ambulatory Visit: Payer: Self-pay

## 2022-07-11 MED ORDER — NEBIVOLOL HCL 10 MG PO TABS: ORAL_TABLET | ORAL | 1 refills | Status: DC

## 2022-07-19 ENCOUNTER — Encounter (INDEPENDENT_AMBULATORY_CARE_PROVIDER_SITE_OTHER): Payer: Medicare Other | Admitting: Ophthalmology

## 2022-07-19 DIAGNOSIS — E119 Type 2 diabetes mellitus without complications: Secondary | ICD-10-CM | POA: Diagnosis not present

## 2022-07-19 DIAGNOSIS — H43813 Vitreous degeneration, bilateral: Secondary | ICD-10-CM | POA: Diagnosis not present

## 2022-07-19 LAB — HM DIABETES EYE EXAM

## 2022-07-23 ENCOUNTER — Other Ambulatory Visit: Payer: Self-pay | Admitting: *Deleted

## 2022-07-23 DIAGNOSIS — R079 Chest pain, unspecified: Secondary | ICD-10-CM

## 2022-07-23 DIAGNOSIS — R002 Palpitations: Secondary | ICD-10-CM

## 2022-07-23 MED ORDER — ISOSORBIDE MONONITRATE ER 30 MG PO TB24: 90.0000 mg | ORAL_TABLET | Freq: Every day | ORAL | 6 refills | Status: DC

## 2022-08-06 ENCOUNTER — Ambulatory Visit: Payer: Medicare Other | Admitting: Physical Therapy
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
## 2022-08-15 ENCOUNTER — Ambulatory Visit: Payer: Medicare Other | Attending: Obstetrics and Gynecology | Admitting: Physical Therapy

## 2022-08-15 ENCOUNTER — Encounter: Payer: Self-pay | Admitting: Physical Therapy

## 2022-08-15 DIAGNOSIS — M6281 Muscle weakness (generalized): Secondary | ICD-10-CM | POA: Insufficient documentation

## 2022-08-15 DIAGNOSIS — R279 Unspecified lack of coordination: Secondary | ICD-10-CM | POA: Diagnosis not present

## 2022-08-15 DIAGNOSIS — R29890 Loss of height: Secondary | ICD-10-CM | POA: Diagnosis not present

## 2022-08-15 DIAGNOSIS — N958 Other specified menopausal and perimenopausal disorders: Secondary | ICD-10-CM | POA: Diagnosis not present

## 2022-08-15 DIAGNOSIS — M62838 Other muscle spasm: Secondary | ICD-10-CM | POA: Diagnosis not present

## 2022-08-15 DIAGNOSIS — Z8262 Family history of osteoporosis: Secondary | ICD-10-CM | POA: Diagnosis not present

## 2022-08-15 DIAGNOSIS — Z13820 Encounter for screening for osteoporosis: Secondary | ICD-10-CM | POA: Diagnosis not present

## 2022-08-15 LAB — HM DEXA SCAN: HM Dexa Scan: NORMAL
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
## 2022-08-22 ENCOUNTER — Ambulatory Visit: Payer: Medicare Other | Admitting: Physical Therapy

## 2022-08-22 ENCOUNTER — Encounter: Payer: Self-pay | Admitting: Physical Therapy

## 2022-08-22 DIAGNOSIS — R279 Unspecified lack of coordination: Secondary | ICD-10-CM

## 2022-08-22 DIAGNOSIS — M6281 Muscle weakness (generalized): Secondary | ICD-10-CM

## 2022-08-22 DIAGNOSIS — M62838 Other muscle spasm: Secondary | ICD-10-CM

## 2022-08-24 ENCOUNTER — Encounter: Payer: Self-pay | Admitting: Cardiology

## 2022-08-27 ENCOUNTER — Encounter: Payer: Self-pay | Admitting: Internal Medicine

## 2022-08-27 NOTE — Patient Instructions (Addendum)
      Blood work was ordered.   The lab is on the first floor.    Medications changes include :       A referral was ordered and someone will call you to schedule an appointment.     Return in about 6 months (around 02/28/2023) for follow up.

## 2022-08-27 NOTE — Progress Notes (Unsigned)
Subjective:    Patient ID: Samantha [name], female    DOB: 1940/12/04, 82 y.o.   MRN: [id number]     HPI Samantha [name] is here for follow up of her chronic medical problems.  Left lower leg - very sensitive to touch no N/T -- just on medial and lateral aspect -- feels like pins  - prickly pain --- gets better throughout day.    Overall doing well   Medications and allergies reviewed with patient and updated if appropriate.  Current Outpatient Medications on File Prior to Visit  Medication Sig Dispense Refill   acetaminophen (TYLENOL) 500 MG tablet Take 500 mg by mouth every 6 (six) hours as needed for mild pain.      albuterol (PROVENTIL) (2.5 MG/3ML) 0.083% nebulizer solution Take 3 mLs (2.5 mg total) by nebulization every 6 (six) hours as needed for wheezing or shortness of breath. 150 mL 0   budesonide-formoterol (SYMBICORT) 160-4.5 MCG/ACT inhaler Inhale 2 puffs into the lungs 2 (two) times daily. 1 each 3   Calcium-Magnesium-Zinc (CAL-MAG-ZINC PO) Take 1 tablet by mouth daily.     cetirizine (ZYRTEC) 10 MG tablet TAKE 1 TABLET(10 MG) BY MOUTH DAILY 90 tablet 3   cholecalciferol (VITAMIN D) 1000 UNITS tablet Take 1,000 Units by mouth every morning.      clopidogrel (PLAVIX) 75 MG tablet TAKE 1 TABLET(75 MG) BY MOUTH DAILY 90 tablet 1   dapagliflozin propanediol (FARXIGA) 10 MG TABS tablet TAKE 1 TABLET(10 MG) BY MOUTH DAILY BEFORE BREAKFAST 30 tablet 5   DULoxetine (CYMBALTA) 30 MG capsule TAKE 1 CAPSULE(30 MG) BY MOUTH DAILY 90 capsule 3   Erenumab-aooe (AIMOVIG) 70 MG/ML SOAJ Inject 70 mg into the skin every 30 (thirty) days. 1 mL 11   Evolocumab (REPATHA SURECLICK) 140 MG/ML SOAJ INJECT 1 PEN UNDER THE SKIN EVERY 14 DAYS 6 mL 3   famotidine (PEPCID) 40 MG tablet TAKE 1 TABLET(40 MG) BY MOUTH DAILY AS NEEDED FOR HEARTBURN OR INDIGESTION 30 tablet 5   glucose blood test strip Use as instructed 100 each 12   hydroxychloroquine (PLAQUENIL) 200 MG tablet Take 400 mg by mouth  daily.      isosorbide mononitrate (IMDUR) 30 MG 24 hr tablet Take 3 tablets (90 mg total) by mouth daily. 90 tablet 6   Lancets (ONETOUCH ULTRASOFT) lancets Use as instructed 100 each 12   losartan (COZAAR) 25 MG tablet TAKE 1 TABLET(25 MG) BY MOUTH DAILY 90 tablet 2   nebivolol (BYSTOLIC) 10 MG tablet TAKE 1 LKGMWN(02 MG) BY MOUTH DAILY 90 tablet 1   nitroGLYCERIN (NITROSTAT) 0.4 MG SL tablet DISSOLVE 1 TABLET UNDER THE TONGUE EVERY 5 MINUTES AS NEEDED FOR CHEST PAIN 25 tablet 5   Polyethyl Glycol-Propyl Glycol (SYSTANE) 0.4-0.3 % GEL ophthalmic gel Place 1 application into both eyes daily as needed (for dry eyes).      pregabalin (LYRICA) 75 MG capsule TAKE 1 CAPSULE(75 MG) BY MOUTH THREE TIMES DAILY 90 capsule 5   psyllium (METAMUCIL) 58.6 % powder Take 1 packet by mouth daily.     ranolazine (RANEXA) 500 MG 12 hr tablet TAKE 1 TABLET(500 MG) BY MOUTH TWICE DAILY 180 tablet 3   Rimegepant Sulfate (NURTEC) 75 MG TBDP Take 1 tab at onset of migraine.  May repeat in 2 hrs, if needed.  Max dose: 2 tabs/day. This is a 30 day prescription. 12 tablet 11   torsemide (DEMADEX) 20 MG tablet Take one tablet by mouth every other day  45 tablet 2   VENTOLIN HFA 108 (90 Base) MCG/ACT inhaler INHALE 2 PUFFS INTO THE LUNGS EVERY 6 HOURS AS NEEDED FOR WHEEZING OR SHORTNESS OF BREATH 18 g 11   No current facility-administered medications on file prior to visit.     Review of Systems  Constitutional:  Negative for fever.  Respiratory:  Positive for wheezing (chronic - mild). Negative for cough and shortness of breath.   Cardiovascular:  Positive for leg swelling (occ with increased salt intake). Negative for chest pain and palpitations.  Gastrointestinal:        Samantha [name] - controlled  Neurological:  Positive for headaches.       Objective:   Vitals:   08/28/22 1044  BP: 124/70  Pulse: 65  Temp: 98.4 F (36.9 C)  SpO2: 97%   BP Readings from Last 3 Encounters:  08/28/22 124/70  05/22/22 128/70   04/23/22 120/72   Wt Readings from Last 3 Encounters:  08/28/22 203 lb 6.4 oz (92.3 kg)  05/22/22 204 lb (92.5 kg)  04/23/22 201 lb (91.2 kg)   Body mass index is 36.61 kg/m.    Physical Exam Constitutional:      General: She is not in acute distress.    Appearance: Normal appearance.  HENT:     Head: Normocephalic and atraumatic.  Eyes:     Conjunctiva/sclera: Conjunctivae normal.  Cardiovascular:     Rate and Rhythm: Normal rate and regular rhythm.     Heart sounds: Normal heart sounds.  Pulmonary:     Effort: Pulmonary effort is normal. No respiratory distress.     Breath sounds: Normal breath sounds. No wheezing.  Musculoskeletal:     Cervical back: Neck supple.     Right lower leg: No edema.     Left lower leg: No edema.  Lymphadenopathy:     Cervical: No cervical adenopathy.  Skin:    General: Skin is warm and dry.     Findings: No rash.  Neurological:     Mental Status: She is alert. Mental status is at baseline.  Psychiatric:        Mood and Affect: Mood normal.        Behavior: Behavior normal.        Lab Results  Component Value Date   WBC 3.7 02/15/2022   HGB 13.8 02/15/2022   HCT 40.6 02/15/2022   PLT 150 02/15/2022   GLUCOSE 99 02/27/2022   CHOL 151 02/27/2022   TRIG 72.0 02/27/2022   HDL 82.50 02/27/2022   LDLDIRECT 126.2 12/07/2010   LDLCALC 54 02/27/2022   ALT 24 02/27/2022   AST 19 02/27/2022   NA 138 02/27/2022   K 4.2 02/27/2022   CL 103 02/27/2022   CREATININE 1.10 02/27/2022   BUN 21 02/27/2022   CO2 28 02/27/2022   TSH 2.49 10/20/2020   INR 0.95 07/22/2017   HGBA1C 6.4 02/27/2022   MICROALBUR <0.7 02/27/2022     Assessment & Plan:    See Problem List for Assessment and Plan of chronic medical problems.

## 2022-08-28 ENCOUNTER — Encounter: Payer: Self-pay | Admitting: Podiatry

## 2022-08-28 ENCOUNTER — Ambulatory Visit (INDEPENDENT_AMBULATORY_CARE_PROVIDER_SITE_OTHER): Payer: Medicare Other | Admitting: Internal Medicine

## 2022-08-28 ENCOUNTER — Ambulatory Visit (INDEPENDENT_AMBULATORY_CARE_PROVIDER_SITE_OTHER): Payer: Medicare Other | Admitting: Podiatry

## 2022-08-28 VITALS — BP 130/75

## 2022-08-28 VITALS — BP 124/70 | HR 65 | Temp 98.4°F | Ht 62.5 in | Wt 203.4 lb

## 2022-08-28 DIAGNOSIS — E119 Type 2 diabetes mellitus without complications: Secondary | ICD-10-CM

## 2022-08-28 DIAGNOSIS — B351 Tinea unguium: Secondary | ICD-10-CM | POA: Diagnosis not present

## 2022-08-28 DIAGNOSIS — K219 Gastro-esophageal reflux disease without esophagitis: Secondary | ICD-10-CM

## 2022-08-28 DIAGNOSIS — M79675 Pain in left toe(s): Secondary | ICD-10-CM

## 2022-08-28 DIAGNOSIS — L84 Corns and callosities: Secondary | ICD-10-CM

## 2022-08-28 DIAGNOSIS — J4530 Mild persistent asthma, uncomplicated: Secondary | ICD-10-CM

## 2022-08-28 DIAGNOSIS — I5032 Chronic diastolic (congestive) heart failure: Secondary | ICD-10-CM | POA: Diagnosis not present

## 2022-08-28 DIAGNOSIS — E1142 Type 2 diabetes mellitus with diabetic polyneuropathy: Secondary | ICD-10-CM | POA: Diagnosis not present

## 2022-08-28 DIAGNOSIS — I10 Essential (primary) hypertension: Secondary | ICD-10-CM | POA: Diagnosis not present

## 2022-08-28 DIAGNOSIS — Z794 Long term (current) use of insulin: Secondary | ICD-10-CM | POA: Diagnosis not present

## 2022-08-28 DIAGNOSIS — N1831 Chronic kidney disease, stage 3a: Secondary | ICD-10-CM | POA: Diagnosis not present

## 2022-08-28 DIAGNOSIS — I2510 Atherosclerotic heart disease of native coronary artery without angina pectoris: Secondary | ICD-10-CM

## 2022-08-28 DIAGNOSIS — E782 Mixed hyperlipidemia: Secondary | ICD-10-CM | POA: Diagnosis not present

## 2022-08-28 DIAGNOSIS — I119 Hypertensive heart disease without heart failure: Secondary | ICD-10-CM

## 2022-08-28 DIAGNOSIS — M069 Rheumatoid arthritis, unspecified: Secondary | ICD-10-CM

## 2022-08-28 DIAGNOSIS — M2011 Hallux valgus (acquired), right foot: Secondary | ICD-10-CM

## 2022-08-28 DIAGNOSIS — I2583 Coronary atherosclerosis due to lipid rich plaque: Secondary | ICD-10-CM | POA: Diagnosis not present

## 2022-08-28 DIAGNOSIS — Z9889 Other specified postprocedural states: Secondary | ICD-10-CM

## 2022-08-28 DIAGNOSIS — M79674 Pain in right toe(s): Secondary | ICD-10-CM

## 2022-08-28 DIAGNOSIS — G629 Polyneuropathy, unspecified: Secondary | ICD-10-CM

## 2022-08-28 LAB — CBC WITH DIFFERENTIAL/PLATELET
Basophils Absolute: 0 10*3/uL (ref 0.0–0.1)
Basophils Relative: 0.8 % (ref 0.0–3.0)
Eosinophils Absolute: 0.2 10*3/uL (ref 0.0–0.7)
Eosinophils Relative: 4.5 % (ref 0.0–5.0)
HCT: 44.5 % (ref 36.0–46.0)
Hemoglobin: 14.3 g/dL (ref 12.0–15.0)
Lymphocytes Relative: 34.5 % (ref 12.0–46.0)
Lymphs Abs: 1.3 10*3/uL (ref 0.7–4.0)
MCHC: 32.2 g/dL (ref 30.0–36.0)
MCV: 89.6 fl (ref 78.0–100.0)
Monocytes Absolute: 0.5 10*3/uL (ref 0.1–1.0)
Monocytes Relative: 12.7 % — ABNORMAL HIGH (ref 3.0–12.0)
Neutro Abs: 1.7 10*3/uL (ref 1.4–7.7)
Neutrophils Relative %: 47.5 % (ref 43.0–77.0)
Platelets: 140 10*3/uL — ABNORMAL LOW (ref 150.0–400.0)
RBC: 4.96 Mil/uL (ref 3.87–5.11)
RDW: 15 % (ref 11.5–15.5)
WBC: 3.6 10*3/uL — ABNORMAL LOW (ref 4.0–10.5)

## 2022-08-28 LAB — LIPID PANEL
Cholesterol: 178 mg/dL (ref 0–200)
HDL: 97.6 mg/dL (ref >39.00)
LDL Cholesterol: 62 mg/dL (ref 0–99)
NonHDL: 80.37
Total CHOL/HDL Ratio: 2
Triglycerides: 90 mg/dL (ref 0.0–149.0)
VLDL: 18 mg/dL (ref 0.0–40.0)

## 2022-08-28 LAB — COMPREHENSIVE METABOLIC PANEL
ALT: 14 U/L (ref 0–35)
AST: 16 U/L (ref 0–37)
Albumin: 4.5 g/dL (ref 3.5–5.2)
Alkaline Phosphatase: 54 U/L (ref 39–117)
BUN: 24 mg/dL — ABNORMAL HIGH (ref 6–23)
CO2: 30 mEq/L (ref 19–32)
Calcium: 9.5 mg/dL (ref 8.4–10.5)
Chloride: 98 mEq/L (ref 96–112)
Creatinine, Ser: 1.23 mg/dL — ABNORMAL HIGH (ref 0.40–1.20)
GFR: 41.56 mL/min — ABNORMAL LOW (ref >60.00)
Glucose, Bld: 110 mg/dL — ABNORMAL HIGH (ref 70–99)
Potassium: 4 mEq/L (ref 3.5–5.1)
Sodium: 137 mEq/L (ref 135–145)
Total Bilirubin: 0.6 mg/dL (ref 0.2–1.2)
Total Protein: 7.3 g/dL (ref 6.0–8.3)

## 2022-08-28 LAB — HEMOGLOBIN A1C: Hgb A1c MFr Bld: 6.5 % (ref 4.6–6.5)

## 2022-08-28 LAB — TSH: TSH: 2.44 u[IU]/mL (ref 0.35–5.50)

## 2022-08-28 MED ORDER — FAMOTIDINE 40 MG PO TABS: 40.0000 mg | ORAL_TABLET | Freq: Every day | ORAL | 1 refills | Status: DC

## 2022-08-28 MED ORDER — CETIRIZINE HCL 10 MG PO TABS: ORAL_TABLET | ORAL | 3 refills | Status: DC

## 2022-08-28 MED ORDER — DAPAGLIFLOZIN PROPANEDIOL 10 MG PO TABS: 10.0000 mg | ORAL_TABLET | Freq: Every day | ORAL | 1 refills | Status: DC

## 2022-08-28 NOTE — Assessment & Plan Note (Signed)
Chronic Check lipid panel  Continue repatha Regular exercise and healthy diet encouraged

## 2022-08-28 NOTE — Patient Instructions (Signed)

## 2022-08-28 NOTE — Assessment & Plan Note (Signed)
Chronic Lab Results  Component Value Date   HGBA1C 6.4 02/27/2022   Sugars are well-controlled A1c today Continue Farxiga 10 mg daily

## 2022-08-28 NOTE — Assessment & Plan Note (Signed)
Chronic Blood pressure is controlled Continue Imdur 90 mg daily, losartan 25 mg daily and Bystolic 10 mg daily Cmp, cbc

## 2022-08-28 NOTE — Assessment & Plan Note (Signed)
Chronic GERD controlled Continue pepcid 40 mg daily prn

## 2022-08-28 NOTE — Assessment & Plan Note (Signed)
Chronic Following with Samantha [name]

## 2022-08-28 NOTE — Assessment & Plan Note (Signed)
Chronic Appears euvolemic Follows with cardiology Continue farxiga 10 mg daily, torsemide 20 mg every other day

## 2022-08-28 NOTE — Assessment & Plan Note (Signed)
Chronic Stable Cmp, cbc

## 2022-08-28 NOTE — Assessment & Plan Note (Signed)
Chronic Controlled Continue Lyrica 75 mg 3 times daily-also taking for occipital neuralgia Also on Cymbalta 30 mg daily

## 2022-08-28 NOTE — Assessment & Plan Note (Signed)
Chronic Follows with cardiology No symptoms c/w angina Continue current medications

## 2022-08-28 NOTE — Assessment & Plan Note (Addendum)
Chronic Better - still has chronic wheeze that is mild Overall controlled Continue albuterol prn Continue symbicort to 160-4.5 mcg bid

## 2022-08-29 ENCOUNTER — Ambulatory Visit: Payer: Medicare Other | Admitting: Physical Therapy

## 2022-09-02 NOTE — Progress Notes (Signed)
Subjective: Samantha [name] presents today for diabetic foot evaluation. She relates prior h/o bunionectomy left foot and hammertoe repair left 2nd toe. She does have some stiffness of the left foot at her surgery site.  Patient relates 20 year h/o diabetes.  Patient denies any h/o foot wounds.  Patient endorses occasional symptoms of burning in feet.  Risk factors: diabetes, diabetic neuropathy, h/o TIA, HTN, CAD, CHF, CKD, hyperlipidemia, h/o tobacco use in remission, RA.  PCP is Pincus Sanes, MD.  Past Medical History:  Diagnosis Date   A-fib [facility])    Abnormal CT of the chest 2008   last CT4-l 2009:  . No f/u suggested    Allergy    Asthma    CAD (coronary artery disease)    a. Coronary CTA 10/16: Coronary Ca score 211, mod non-obstructive CAD with LM mild plaque (25-50%), mid LAD 50-69%. b. Neg nuc 06/2015.   Cataract    BILATERAL-REMOVED   Chronic diastolic CHF (congestive heart failure) (HCC)    Collagen vascular disease (HCC)    "arterial sclerosis" per pt   Complication of anesthesia    trouble waking up   Fibromyalgia    GERD (gastroesophageal reflux disease)    H/O hiatal hernia    Hearing loss    left ear   Heart murmur    Hyperlipidemia    Hypertension    Kidney disease 04/04/2022   Malignant neoplasm of ascending colon (HCC) 2016   Minimally invasive right hemicolectomy to be done    Neuromuscular disorder (HCC)    FIBROMYALGIA   Ocular migraine    OSA (obstructive sleep apnea) 09/2007   dx w/ a sleep study, not on  CPAP   Osteoarthritis    Osteoporosis    Pneumonia    "double" in 2004   PONV (postoperative nausea and vomiting)    Reactive airway disease 01/29/2002   dx of pseudoasthma / vcd in 2005 and nl sprirometry History of dyspnea, 2011,  improved after several medications were changed around Question of COPD, disproved July 06, 2009 with nl pft's       Rheumatoid factor positive    Shingles 11/2009   Sleep apnea    Stroke (HCC)    TIA  (transient ischemic attack)    x2 - on Plavix for this   Torn rotator cuff    right worse than left, both are torn   Tumor, 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   Type II diabetes mellitus (HCC)    Vaginal cancer (HCC) 1994   Vaginal dysplasia     Patient Active Problem List   Diagnosis Date Noted   Acute asthma exacerbation 05/22/2022   Allergic rhinitis 05/21/2022   Pain of right hip 04/04/2022   Female bladder prolapse 04/04/2022   Tinnitus of left ear 02/14/2022   Dysplasia of vagina 01/10/2022   Right calf pain 06/16/2021   Neck pain 04/17/2021   Cervicogenic headache 04/17/2021   Chronic migraine w/o aura w/o status migrainosus, not intractable 04/17/2021   Urinary frequency 02/21/2021   CKD (chronic kidney disease) stage 3, GFR 30-59 ml/min (HCC) 02/20/2021   Spinal stenosis of cervical region 02/16/2021   Rheumatoid arthritis (HCC) - Dr Kathi Ludwig 10/20/2020   Neuropathy 10/19/2020   Vitamin D deficiency 04/13/2020   S/P TKR (total knee replacement), 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   Aortic atherosclerosis (HCC) 11/03/2019   Posterior vitreous detachment of right eye 07/15/2019   Posterior vitreous detachment of left eye 07/15/2019  Recurrent corneal erosion, 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   Raynaud phenomenon 05/06/2019   Trigger finger, left ring finger 03/18/2019   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 01/28/2018   GERD (gastroesophageal reflux disease) 10/23/2017   Sacral back pain 10/16/2017   Epistaxis 07/23/2017   Chronic left upper quadrant pain 07/23/2017   Poor balance 03/14/2017   Angina pectoris (HCC) 03/22/2016   Hypertensive heart disease 07/07/2015   Essential hypertension 07/07/2015   Lightheadedness 07/07/2015   Plantar fasciitis, left 03/22/2015   Coronary artery disease due to lipid rich plaque 01/05/2015   Chronic diastolic CHF (congestive heart failure), NYHA class 2 (HCC) 01/05/2015   Malignant neoplasm of ascending colon   pT1, pN0, rM0 s/p robotic colectomy 11/11/2014 11/11/2014   Migraine (Ocular) 01/05/2014   VBI (vertebrobasilar insufficiency) 08/22/2012   TIA (transient ischemic attack) 06/27/2012   DJD (degenerative joint disease) 02/02/2011   Varicose veins of legs 06/06/2010   Palpitations 11/23/2008   Acute cystitis 09/28/2008   Fibromyalgia 08/15/2007   DM II (diabetes mellitus, type II), w/ neuropathy 05/21/2006   Hyperlipidemia 05/21/2006   Asthma 01/29/2002    Past Surgical History:  Procedure Laterality Date   ABDOMINAL HYSTERECTOMY  1980   NO oophorectomy per pt    ANTERIOR CERVICAL DECOMP/DISCECTOMY FUSION  2001   C 3, C4 and C5 plate and screws   BREAST BIOPSY Right 1999   BUNIONECTOMY Left ~ 1977   CARDIAC CATHETERIZATION     2018 By Dr. Garnette Scheuermann (done after colon surgery)   CATARACT EXTRACTION W/ INTRAOCULAR LENS  IMPLANT, BILATERAL  2012   COLON SURGERY  10/2014   EYE SURGERY Bilateral    torq lens for cataracts   LEFT HEART CATH AND CORONARY ANGIOGRAPHY N/A 03/22/2016   Procedure: Left Heart Cath and Coronary Angiography;  Surgeon: Lyn Records, MD;  Location: [facility] INVASIVE CV LAB;  Service: Cardiovascular;  Laterality: N/A;   LUMBAR LAMINECTOMY/DECOMPRESSION MICRODISCECTOMY N/A 01/20/2018   Procedure: L4-5 decompression;  Surgeon: Eldred Manges, MD;  Location: [facility] OR;  Service: Orthopedics;  Laterality: N/A;   THYROIDECTOMY, PARTIAL  1960's   TOTAL KNEE ARTHROPLASTY Right 12/04/2019   Procedure: RIGHT TOTAL KNEE ARTHROPLASTY;  Surgeon: Eldred Manges, MD;  Location: [facility] OR;  Service: Orthopedics;  Laterality: Right;  RNFA APRIL PLEASE   VAGINAL MASS EXCISION  1994   "Laser surgery for vaginal cancer; followed by chemotherapy" (06/27/2012)    Current Outpatient Medications on File Prior to Visit  Medication Sig Dispense Refill   acetaminophen (TYLENOL) 500 MG tablet Take 500 mg by mouth every 6 (six) hours as needed for mild pain.      albuterol (PROVENTIL) (2.5 MG/3ML) 0.083%  nebulizer solution Take 3 mLs (2.5 mg total) by nebulization every 6 (six) hours as needed for wheezing or shortness of breath. 150 mL 0   budesonide-formoterol (SYMBICORT) 160-4.5 MCG/ACT inhaler Inhale 2 puffs into the lungs 2 (two) times daily. 1 each 3   Calcium-Magnesium-Zinc (CAL-MAG-ZINC PO) Take 1 tablet by mouth daily.     cetirizine (ZYRTEC) 10 MG tablet TAKE 1 TABLET(10 MG) BY MOUTH DAILY 90 tablet 3   cholecalciferol (VITAMIN D) 1000 UNITS tablet Take 1,000 Units by mouth every morning.      clopidogrel (PLAVIX) 75 MG tablet TAKE 1 TABLET(75 MG) BY MOUTH DAILY 90 tablet 1   dapagliflozin propanediol (FARXIGA) 10 MG TABS tablet Take 1 tablet (10 mg total) by mouth daily. TAKE 1 TABLET(10 MG) BY MOUTH DAILY  BEFORE BREAKFAST 90 tablet 1   DULoxetine (CYMBALTA) 30 MG capsule TAKE 1 CAPSULE(30 MG) BY MOUTH DAILY 90 capsule 3   Erenumab-aooe (AIMOVIG) 70 MG/ML SOAJ Inject 70 mg into the skin every 30 (thirty) days. 1 mL 11   Evolocumab (REPATHA SURECLICK) 140 MG/ML SOAJ INJECT 1 PEN UNDER THE SKIN EVERY 14 DAYS 6 mL 3   famotidine (PEPCID) 40 MG tablet Take 1 tablet (40 mg total) by mouth daily. 90 tablet 1   glucose blood test strip Use as instructed 100 each 12   hydroxychloroquine (PLAQUENIL) 200 MG tablet Take 400 mg by mouth daily.      isosorbide mononitrate (IMDUR) 30 MG 24 hr tablet Take 3 tablets (90 mg total) by mouth daily. 90 tablet 6   Lancets (ONETOUCH ULTRASOFT) lancets Use as instructed 100 each 12   losartan (COZAAR) 25 MG tablet TAKE 1 TABLET(25 MG) BY MOUTH DAILY 90 tablet 2   nebivolol (BYSTOLIC) 10 MG tablet TAKE 1 AOZHYQ(65 MG) BY MOUTH DAILY 90 tablet 1   nitroGLYCERIN (NITROSTAT) 0.4 MG SL tablet DISSOLVE 1 TABLET UNDER THE TONGUE EVERY 5 MINUTES AS NEEDED FOR CHEST PAIN 25 tablet 5   Polyethyl Glycol-Propyl Glycol (SYSTANE) 0.4-0.3 % GEL ophthalmic gel Place 1 application into both eyes daily as needed (for dry eyes).      pregabalin (LYRICA) 75 MG capsule TAKE 1  CAPSULE(75 MG) BY MOUTH THREE TIMES DAILY 90 capsule 5   psyllium (METAMUCIL) 58.6 % powder Take 1 packet by mouth daily.     ranolazine (RANEXA) 500 MG 12 hr tablet TAKE 1 TABLET(500 MG) BY MOUTH TWICE DAILY 180 tablet 3   Rimegepant Sulfate (NURTEC) 75 MG TBDP Take 1 tab at onset of migraine.  May repeat in 2 hrs, if needed.  Max dose: 2 tabs/day. This is a 30 day prescription. 12 tablet 11   torsemide (DEMADEX) 20 MG tablet Take one tablet by mouth every other day 45 tablet 2   VENTOLIN HFA 108 (90 Base) MCG/ACT inhaler INHALE 2 PUFFS INTO THE LUNGS EVERY 6 HOURS AS NEEDED FOR WHEEZING OR SHORTNESS OF BREATH 18 g 11   No current facility-administered medications on file prior to visit.     Allergies  Allergen Reactions   Bactrim [Sulfamethoxazole-Trimethoprim] Other (See Comments)    See OV 09-15-13, rash-tongue swelling due to bactrim ?   Cefuroxime Axetil Hives   Oxycodone Nausea And Vomiting   Pravastatin Other (See Comments)    "muscle breakdown" with profuse sweating   Terfenadine Hives and Other (See Comments)    Other reaction(s): Other (See Comments)  Other reaction(s): Not available   Zocor [Simvastatin] Other (See Comments)    2012 "Muscle breakdown " with profuse sweating   Tramadol Nausea And Vomiting   Atorvastatin Other (See Comments)    Pt reports muscle aches and joint pains   Cefuroxime Other (See Comments)   Lactose Intolerance (Gi) Diarrhea   Latex     blisters   Lime Flavor [Flavoring Agent] Diarrhea   Metformin And Related Diarrhea   Oxycodone Hcl Other (See Comments)   Pravastatin Sodium Other (See Comments)   Rosuvastatin Other (See Comments)    Pt reports "myalgias, fatigue, nosebleeds."   Rosuvastatin Calcium Other (See Comments)   Tape Other (See Comments)    blisters   Wound Dressing Adhesive Other (See Comments)    blisters    Social History   Occupational History   Occupation: Retired, disable since 2000    Employer:  RETIRED  Tobacco Use    Smoking status: Former    Current packs/day: 0.00    Average packs/day: 0.3 packs/day for 5.0 years (1.3 ttl pk-yrs)    Types: Cigarettes    Start date: 01/29/1993    Quit date: 01/29/1998    Years since quitting: 24.6   Smokeless tobacco: Never   Tobacco comments:    Quit in 2001  Vaping Use   Vaping status: Never Used  Substance and Sexual Activity   Alcohol use: No    Alcohol/week: 0.0 standard drinks of alcohol   Drug use: No   Sexual activity: Never    Family History  Problem Relation Age of Onset   Heart disease Father    Heart disease Mother    Lung cancer Mother    Allergies Sister    Parkinsonism Sister        possible   Asthma Sister    Asthma Paternal Grandmother    Stroke Paternal Grandmother    Heart disease Other        paternal grandparents, maternal grandparents,    Heart disease Brother    Emphysema Brother    Aneurysm Brother        x3   Kidney failure Brother    Diabetes Brother    Diabetes Brother    Stroke Brother    Stroke Maternal Grandmother    Breast cancer Neg Hx    Colon cancer Neg Hx    Heart attack Neg Hx    Esophageal cancer Neg Hx    Rectal cancer Neg Hx    Stomach cancer Neg Hx     Immunization History  Administered Date(s) Administered   Fluad Quad(high Dose 65+) 11/05/2018, 11/04/2019   Influenza Split 11/30/2010   Influenza Whole 10/20/2008, 01/02/2010   Influenza, High Dose Seasonal PF 12/24/2012, 03/17/2015, 10/19/2015, 10/19/2016, 10/23/2017, 11/15/2021   Influenza, Seasonal, Injecte, Preservative Fre 01/01/2012   Influenza,inj,Quad PF,6+ Mos 11/13/2013   Influenza-Unspecified 11/08/2020   PFIZER(Purple Top)SARS-COV-2 Vaccination 03/24/2019, 04/14/2019   Pneumococcal Conjugate-13 05/19/2014   Pneumococcal Polysaccharide-23 01/29/2002, 11/23/2008   Respiratory Syncytial Virus Vaccine,Recomb Aduvanted(Arexvy) 07/19/2022   Td 01/29/2001   Tdap 08/14/2011   Zoster Recombinant(Shingrix) 03/02/2022, 07/19/2022   Zoster,  Live 12/24/2012    Objective: Vitals:   08/28/22 1609  BP: 130/75   Samantha [name] is a pleasant 82 y.o. female in NAD. AAO X 3.   Vascular Examination: Capillary refill time immediate b/l. Vascular status intact b/l with palpable pedal pulses. Pedal hair present b/l. No pain with calf compression b/l. Skin temperature gradient WNL b/l. No cyanosis or clubbing b/l. No ischemia or gangrene noted b/l.   Neurological Examination: Pt has subjective symptoms of neuropathy. Sensation grossly intact b/l with 10 gram monofilament. Vibratory sensation intact b/l.   Dermatological Examination: Pedal skin with normal turgor, texture and tone b/l.  No open wounds. No interdigital macerations.   Well healed surgical scar left 1st metatarsal  and left 2nd digit at MPJs.  Toenails 1-5 b/l thick, discolored, elongated with subungual debris and pain on dorsal palpation.   Hyperkeratotic lesion(s) L 3rd toe, right great toe, and submet head 5 left foot.  No erythema, no edema, no drainage, no fluctuance.  Musculoskeletal Examination: Muscle strength 5/5 to all lower extremity muscle groups bilaterally. Hallux valgus with bunion deformity noted right lower extremity. Hammertoe(s) noted to the bilateral 2nd toes.  Radiographs: None     Latest Ref Rng & Units 08/28/2022   11:35 AM 02/27/2022  1:48 PM  Hemoglobin A1C  Hemoglobin-A1c 4.6 - 6.5 % 6.5  6.4     Assessment: 1. Pain due to onychomycosis of toenails of both feet   2. Corns and callosities   3. Hallux valgus (acquired), right foot   4. History of foot surgery   5. Controlled type 2 diabetes mellitus with diabetic polyneuropathy, with long-term current use of insulin (HCC)   6. Encounter for diabetic foot exam (HCC)    ADA Risk Categorization: Low Risk:  Patient has all of the following: Intact protective sensation No prior foot ulcer  No severe deformity Pedal pulses present  Plan: -Patient was evaluated and treated. All  patient's and/or POA's questions/concerns answered on today's visit. -Discussed her h/o foot surgery left foot. She would like to assess her foot stiffness of her bunionectomy and hammertoe repair of left foot. Will have her see Dr. Lilian Kapur for further evaluation. -Diabetic foot examination performed today. -Continue diabetic foot care principles: inspect feet daily, monitor glucose as recommended by PCP and/or Endocrinologist, and follow prescribed diet per PCP, Endocrinologist and/or dietician. -Patient to continue soft, supportive shoe gear daily. -Toenails 1-5 b/l were debrided in length and girth with sterile nail nippers and dremel without iatrogenic bleeding.  -Corn(s) L 3rd toe and callus(es) right great toe and submet head 5 left foot were pared utilizing sterile scalpel blade without incident. Total number debrided =3. -Patient/POA to call should there be question/concern in the interim.  Return in about 3 months (around 11/28/2022).  Freddie Breech, DPM

## 2022-09-04 ENCOUNTER — Ambulatory Visit: Payer: Medicare Other | Attending: Obstetrics and Gynecology

## 2022-09-04 DIAGNOSIS — R279 Unspecified lack of coordination: Secondary | ICD-10-CM | POA: Insufficient documentation

## 2022-09-04 DIAGNOSIS — M62838 Other muscle spasm: Secondary | ICD-10-CM | POA: Insufficient documentation

## 2022-09-04 DIAGNOSIS — M6281 Muscle weakness (generalized): Secondary | ICD-10-CM | POA: Diagnosis present
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
## 2022-09-04 NOTE — Patient Instructions (Signed)

## 2022-09-13 ENCOUNTER — Encounter: Payer: Self-pay | Admitting: Internal Medicine

## 2022-09-13 NOTE — Progress Notes (Signed)
Outside notes received. Information abstracted. Notes sent to scan.  

## 2022-09-18 ENCOUNTER — Encounter: Payer: Self-pay | Admitting: Podiatry

## 2022-09-18 ENCOUNTER — Ambulatory Visit (INDEPENDENT_AMBULATORY_CARE_PROVIDER_SITE_OTHER): Payer: Medicare Other | Admitting: Podiatry

## 2022-09-18 ENCOUNTER — Ambulatory Visit (INDEPENDENT_AMBULATORY_CARE_PROVIDER_SITE_OTHER): Payer: Medicare Other

## 2022-09-18 DIAGNOSIS — M2041 Other hammer toe(s) (acquired), right foot: Secondary | ICD-10-CM | POA: Diagnosis not present

## 2022-09-18 DIAGNOSIS — M21611 Bunion of right foot: Secondary | ICD-10-CM | POA: Diagnosis not present

## 2022-09-18 DIAGNOSIS — M2011 Hallux valgus (acquired), right foot: Secondary | ICD-10-CM | POA: Diagnosis not present

## 2022-09-18 DIAGNOSIS — M2012 Hallux valgus (acquired), left foot: Secondary | ICD-10-CM | POA: Diagnosis not present

## 2022-09-18 DIAGNOSIS — M2042 Other hammer toe(s) (acquired), left foot: Secondary | ICD-10-CM | POA: Diagnosis not present

## 2022-09-18 DIAGNOSIS — M21612 Bunion of left foot: Secondary | ICD-10-CM

## 2022-09-18 DIAGNOSIS — M21619 Bunion of unspecified foot: Secondary | ICD-10-CM | POA: Diagnosis not present

## 2022-09-18 NOTE — Progress Notes (Signed)
  Subjective:  Patient ID: Samantha [name], female    DOB: 16-Apr-1940,  MRN: [id number]  Chief Complaint  Patient presents with   Hammer Toe    Hammertoe to left 2nd toe. She had bunion surgery 1970s and it caused her second toe to hammer toe.     82 y.o. female presents with the above complaint. History confirmed with patient.  She says they do not particularly hurt or limit her she just cannot wear certain shoes that make them feel worse  Objective:  Physical Exam: warm, good capillary refill, no trophic changes or ulcerative lesions, normal DP and PT pulses, normal sensory exam, and semirigid hammertoe deformities worse on the second toe bilateral, hallux valgus forming bilateral with arthritic changes in the left.   Radiographs: Multiple views x-ray of both feet: Significant hallux valgus forming with digital contractures, severe arthrosis of the left first MTP Assessment:   1. Bunion   2. Hallux valgus with bunions, right   3. Hallux valgus with bunions, left   4. Hammertoe of left foot   5. Hammertoe of right foot      Plan:  Patient was evaluated and treated and all questions answered.  We discussed treatment options.  Currently her deformities are not particularly painful or lifestyle limiting.  I recommended she continue to utilize nonsurgical treatment with comfortable shoes that have plenty of space for the toes, do not think surgery is indicated at this point.  Likely would be difficult reconstruction for the deformity including the bunion which would require a first metatarsal phalangeal joint arthrodesis.  She inquired about a Lapidus bunionectomy but I do not think that this would be beneficial for her as she has significant arthritic changes in the joint.  We briefly discussed what recovery for this procedure would entail she would prefer to avoid this.  Return to see me as needed.  Return if symptoms worsen or fail to improve.

## 2022-10-08 ENCOUNTER — Other Ambulatory Visit: Payer: Self-pay | Admitting: Adult Health

## 2022-10-08 ENCOUNTER — Other Ambulatory Visit: Payer: Self-pay | Admitting: Internal Medicine

## 2022-10-16 ENCOUNTER — Other Ambulatory Visit: Payer: Self-pay

## 2022-10-16 DIAGNOSIS — E669 Obesity, unspecified: Secondary | ICD-10-CM | POA: Diagnosis not present

## 2022-10-16 DIAGNOSIS — Z79899 Other long term (current) drug therapy: Secondary | ICD-10-CM | POA: Diagnosis not present

## 2022-10-16 DIAGNOSIS — M1990 Unspecified osteoarthritis, unspecified site: Secondary | ICD-10-CM | POA: Diagnosis not present

## 2022-10-16 DIAGNOSIS — I7300 Raynaud's syndrome without gangrene: Secondary | ICD-10-CM | POA: Diagnosis not present

## 2022-10-16 DIAGNOSIS — I10 Essential (primary) hypertension: Secondary | ICD-10-CM | POA: Diagnosis not present

## 2022-10-16 DIAGNOSIS — M069 Rheumatoid arthritis, unspecified: Secondary | ICD-10-CM | POA: Diagnosis not present

## 2022-10-16 DIAGNOSIS — M79643 Pain in unspecified hand: Secondary | ICD-10-CM | POA: Diagnosis not present

## 2022-10-16 DIAGNOSIS — E785 Hyperlipidemia, unspecified: Secondary | ICD-10-CM | POA: Diagnosis not present

## 2022-10-16 DIAGNOSIS — M797 Fibromyalgia: Secondary | ICD-10-CM | POA: Diagnosis not present

## 2022-10-16 DIAGNOSIS — M542 Cervicalgia: Secondary | ICD-10-CM | POA: Diagnosis not present

## 2022-10-16 MED ORDER — LOSARTAN POTASSIUM 25 MG PO TABS: ORAL_TABLET | ORAL | 0 refills | Status: DC

## 2022-10-29 ENCOUNTER — Ambulatory Visit: Payer: Medicare Other | Admitting: Cardiology

## 2022-10-29 ENCOUNTER — Other Ambulatory Visit: Payer: Self-pay

## 2022-10-29 ENCOUNTER — Telehealth: Payer: Self-pay | Admitting: Internal Medicine

## 2022-10-29 MED ORDER — ALBUTEROL SULFATE (2.5 MG/3ML) 0.083% IN NEBU: 2.5000 mg | INHALATION_SOLUTION | Freq: Four times a day (QID) | RESPIRATORY_TRACT | 0 refills | Status: AC | PRN

## 2022-10-29 NOTE — Telephone Encounter (Signed)
Prescription Request  10/29/2022  LOV: 08/28/2022  What is the name of the medication or equipment? albuterol  Have you contacted your pharmacy to request a refill? Yes   Which pharmacy would you like this sent to?  [id number] - [place], [place] - [address] AT [facility] OF [address] [place] [address] Phone: [phone] Fax: [phone]    Patient notified that their request is being sent to the clinical staff for review and that they should receive a response within 2 business days.   Please advise at Mobile [phone] (mobile)

## 2022-10-29 NOTE — Telephone Encounter (Signed)
Faxed in today. 

## 2022-11-14 ENCOUNTER — Other Ambulatory Visit: Payer: Self-pay | Admitting: Internal Medicine

## 2022-12-06 ENCOUNTER — Ambulatory Visit (HOSPITAL_COMMUNITY): Payer: Medicare Other | Attending: Cardiology

## 2022-12-06 DIAGNOSIS — I2583 Coronary atherosclerosis due to lipid rich plaque: Secondary | ICD-10-CM | POA: Insufficient documentation

## 2022-12-06 DIAGNOSIS — G459 Transient cerebral ischemic attack, unspecified: Secondary | ICD-10-CM | POA: Diagnosis not present

## 2022-12-06 DIAGNOSIS — I77819 Aortic ectasia, unspecified site: Secondary | ICD-10-CM | POA: Diagnosis not present

## 2022-12-06 DIAGNOSIS — I5032 Chronic diastolic (congestive) heart failure: Secondary | ICD-10-CM | POA: Diagnosis not present

## 2022-12-06 DIAGNOSIS — I2510 Atherosclerotic heart disease of native coronary artery without angina pectoris: Secondary | ICD-10-CM | POA: Diagnosis not present

## 2022-12-06 LAB — ECHOCARDIOGRAM COMPLETE
Area-P 1/2: 2.8 cm2
S' Lateral: 2.5 cm

## 2022-12-09 NOTE — Progress Notes (Unsigned)
Cardiology Office Note:    Date:  12/10/2022   ID:  Samantha [name], DOB 09/02/1940, MRN [id number]  PCP:  Pincus Sanes, MD   Follow up of CAD   History of Present Illness:    Samantha [name] is a 82 y.o. 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 The patient continued to have intermittent exertional and resting chest pain that has resolved after she was started on Imdur 30 mg daily.  Saw Jacolyn Reedy in 10/2019 prior to surgery for knee replacement. Had myoview 10/2019 which showed no evidence of ischemia or infarction and normal LVEF. TTE 10/2019 with LVEF 60-65%, normal strain, mild dilation of ascending aorta 38mm. July 2022  Intermitt CP  Imdur increased   Cardiac monitor with with rare ectopy, 1 run of SVT with longest lasting 15 beats.  Sept 2022  Continued to have CP. Resumed on ranexa 500mg  BID.  The pt was previously followed by Jon Billings  Since seen she says she has been feeing pretty good   When first takes meds will feel drained   But gets better during day   No dizziness    Has been having breathing problems   (asthma, reactive airways )  Uising inhalers more  No edema   Diet: Br:   Cereal  juice    Sometimes eggs/toast  Occasiional bacon  Coffee Lunch:   Salad sometimese sandwich    Eats spincach   Mik, ginger ale   Film/video editor potato or rice   Veggies   Ginger ale    Water Snack   Rarely  Does nuts,raisins Past Medical History:  Diagnosis Date   A-fib (HCC)    Abnormal CT of the chest 2008   last CT4-l 2009:  . No f/u suggested    Allergy    Asthma    CAD (coronary artery disease)    a. Coronary CTA 10/16: Coronary Ca score 211, mod non-obstructive CAD with LM mild plaque (25-50%), mid LAD 50-69%. b. Neg nuc 06/2015.   Cataract    BILATERAL-REMOVED   Chronic  diastolic CHF (congestive heart failure) (HCC)    Collagen vascular disease (HCC)    "arterial sclerosis" per pt   Complication of anesthesia    trouble waking up   Fibromyalgia    GERD (gastroesophageal reflux disease)    H/O hiatal hernia    Hearing loss    left ear   Heart murmur    Hyperlipidemia    Hypertension    Kidney disease 04/04/2022   Malignant neoplasm of ascending colon (HCC) 2016   Minimally invasive right hemicolectomy to be done    Neuromuscular disorder (HCC)    FIBROMYALGIA   Ocular migraine    OSA (obstructive sleep apnea) 09/2007   dx w/ a sleep study, not on  CPAP   Osteoarthritis    Osteoporosis    Pneumonia    "double" in 2004   PONV (postoperative nausea and vomiting)    Reactive airway disease 01/29/2002   dx of pseudoasthma / vcd in 2005 and nl sprirometry History of dyspnea, 2011,  improved after several medications were changed around Question of COPD, disproved July 06, 2009 with nl pft's       Rheumatoid factor positive    Shingles 11/2009  Sleep apnea    Stroke (HCC)    TIA (transient ischemic attack)    x2 - on Plavix for this   Torn rotator cuff    right worse than left, both are torn   Tumor, 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 Garnette Scheuermann (done after colon surgery)   CATARACT EXTRACTION W/ INTRAOCULAR LENS  IMPLANT, BILATERAL  2012   COLON SURGERY  10/2014   EYE SURGERY Bilateral    torq lens for cataracts   LEFT HEART CATH AND CORONARY ANGIOGRAPHY N/A 03/22/2016   Procedure: Left Heart Cath and Coronary Angiography;  Surgeon: Lyn Records, MD;   Location[facility] INVASIVE CV LAB;  Service: Cardiovascular;  Laterality: N/A;   LUMBAR LAMINECTOMY/DECOMPRESSION MICRODISCECTOMY N/A 01/20/2018   Procedure: L4-5 decompression;  Surgeon: Eldred Manges, MD;  Location: [facility] OR;  Service: Orthopedics;  Laterality: N/A;   THYROIDECTOMY, PARTIAL  1960's   TOTAL KNEE ARTHROPLASTY Right 12/04/2019   Procedure: RIGHT TOTAL KNEE ARTHROPLASTY;  Surgeon: Eldred Manges, MD;  Location: [facility] OR;  Service: Orthopedics;  Laterality: Right;  RNFA APRIL PLEASE   VAGINAL MASS EXCISION  1994   "Laser surgery for vaginal cancer; followed by chemotherapy" (06/27/2012)    Current Medications: Current Meds  Medication Sig   acetaminophen (TYLENOL) 500 MG tablet Take 500 mg by mouth every 6 (six) hours as needed for mild pain.    albuterol (PROVENTIL) (2.5 MG/3ML) 0.083% nebulizer solution Take 3 mLs (2.5 mg total) by nebulization every 6 (six) hours as needed for wheezing or shortness of breath.   budesonide-formoterol (SYMBICORT) 160-4.5 MCG/ACT inhaler Inhale 2 puffs into the lungs 2 (two) times daily.   Calcium-Magnesium-Zinc (CAL-MAG-ZINC PO) Take 1 tablet by mouth daily.   cetirizine (ZYRTEC) 10 MG tablet TAKE 1 TABLET(10 MG) BY MOUTH DAILY   cholecalciferol (VITAMIN D) 1000 UNITS tablet Take 1,000 Units by mouth every morning.    clopidogrel (PLAVIX) 75 MG tablet TAKE 1 TABLET(75 MG) BY MOUTH DAILY   dapagliflozin propanediol (FARXIGA) 10 MG TABS tablet Take 1 tablet (10 mg total) by mouth daily. TAKE 1 TABLET(10 MG) BY MOUTH DAILY BEFORE BREAKFAST   DULoxetine (CYMBALTA) 30 MG capsule TAKE 1 CAPSULE(30 MG) BY MOUTH DAILY   Evolocumab (REPATHA SURECLICK) 140 MG/ML SOAJ INJECT 1 PEN UNDER THE SKIN EVERY 14 DAYS   famotidine (PEPCID) 40 MG tablet Take 1 tablet (40 mg total) by mouth daily.   glucose blood test strip Use as instructed   hydroxychloroquine (PLAQUENIL) 200 MG tablet Take 400 mg by mouth daily.    isosorbide mononitrate (IMDUR) 30 MG 24 hr tablet Take 3  tablets (90 mg total) by mouth daily.   Lancets (ONETOUCH ULTRASOFT) lancets Use as instructed   losartan (COZAAR) 25 MG tablet TAKE 1 TABLET(25 MG) BY MOUTH DAILY   nebivolol (BYSTOLIC) 10 MG tablet TAKE 1 UJWJXB(14 MG) BY MOUTH DAILY   nitroGLYCERIN (NITROSTAT) 0.4 MG SL tablet DISSOLVE 1 TABLET UNDER THE TONGUE EVERY 5 MINUTES AS NEEDED FOR  CHEST PAIN   Polyethyl Glycol-Propyl Glycol (SYSTANE) 0.4-0.3 % GEL ophthalmic gel Place 1 application into both eyes daily as needed (for dry eyes).    pregabalin (LYRICA) 75 MG capsule TAKE 1 CAPSULE(75 MG) BY MOUTH THREE TIMES DAILY   psyllium (METAMUCIL) 58.6 % powder Take 1 packet by mouth daily.   ranolazine (RANEXA) 500 MG 12 hr tablet TAKE 1 TABLET(500 MG) BY MOUTH TWICE DAILY   Rimegepant Sulfate (NURTEC) 75 MG TBDP DISSOLVE 1 TABLET ON THE TONGUE AT ONSET OF MIGRAINE. MAY REPEAT IN 2 HOURS, AS NEEDED. MAX DOSE: 2 TABLETS DAILY. THIS IS A 30 DAY PRESCRIPTION   torsemide (DEMADEX) 20 MG tablet Take one tablet by mouth every other day   VENTOLIN HFA 108 (90 Base) MCG/ACT inhaler INHALE 2 PUFFS INTO THE LUNGS EVERY 6 HOURS AS NEEDED FOR WHEEZING OR SHORTNESS OF BREATH   [DISCONTINUED] Erenumab-aooe (AIMOVIG) 70 MG/ML SOAJ Inject 70 mg into the skin every 30 (thirty) days.     Allergies:   Bactrim [sulfamethoxazole-trimethoprim], Cefuroxime axetil, Oxycodone, Pravastatin, Terfenadine, Zocor [simvastatin], Tramadol, Atorvastatin, Cefuroxime, Lactose intolerance (gi), Latex, Lime flavor [flavoring agent], Metformin and related, Oxycodone hcl, Pravastatin sodium, Rosuvastatin, Rosuvastatin calcium, Tape, and Wound dressing adhesive   Social History   Socioeconomic History   Marital status: Widowed    Spouse name: Adela Glimpse   Number of children: 2   Years of education: masters   Highest education level: Not on file  Occupational History   Occupation: Retired, disable since 2000    Employer: RETIRED  Tobacco Use   Smoking status: Former    Current  packs/day: 0.00    Average packs/day: 0.3 packs/day for 5.0 years (1.3 ttl pk-yrs)    Types: Cigarettes    Start date: 01/29/1993    Quit date: 01/29/1998    Years since quitting: 24.8   Smokeless tobacco: Never   Tobacco comments:    Quit in 2001  Vaping Use   Vaping status: Never Used  Substance and Sexual Activity   Alcohol use: No    Alcohol/week: 0.0 standard drinks of alcohol   Drug use: No   Sexual activity: Never  Other Topics Concern   Not on file  Social History Narrative   On disability since 2000--- also husband has MS   Education. College.   Right handed.      02/15/2022   Patient's daughter lives with her   Social Determinants of Health   Financial Resource Strain: Low Risk  (12/19/2021)   Overall Financial Resource Strain (CARDIA)    Difficulty of Paying Living Expenses: Not hard at all  Food Insecurity: No Food Insecurity (12/19/2021)   Hunger Vital Sign    Worried About Running Out of Food in the Last Year: Never true    Ran Out of Food in the Last Year: Never true  Transportation Needs: No Transportation Needs (12/19/2021)   PRAPARE - Administrator, Civil Service (Medical): No    Lack of Transportation (Non-Medical): No  Physical Activity: Sufficiently Active (12/19/2021)   Exercise Vital Sign    Days of Exercise per Week: 5 days    Minutes of Exercise per Session: 30 min  Stress: No Stress Concern Present (12/19/2021)   [employer] of Occupational Health - Occupational Stress Questionnaire    Feeling of Stress : Not at all  Social Connections: Moderately Isolated (12/19/2021)   Social Connection and Isolation Panel [NHANES]    Frequency of Communication with Friends and Family: Twice a week  Frequency of Social Gatherings with Friends and Family: Twice a week    Attends Religious Services: More than 4 times per year    Active Member of [employer] or Organizations: No    Attends Banker Meetings: Never    Marital Status:  Widowed     Family History: The patient's family history includes Allergies in her sister; Aneurysm in her brother; Asthma in her paternal grandmother and sister; Diabetes in her brother and brother; Emphysema in her brother; Heart disease in her brother, father, mother, and another family member; Kidney failure in her brother; Lung cancer in her mother; Parkinsonism in her sister; Stroke in her brother, maternal grandmother, and paternal grandmother. There is no history of Breast cancer, Colon cancer, Heart attack, Esophageal cancer, Rectal cancer, or Stomach cancer.    EKGs/Labs/Other Studies Reviewed:    The following studies were reviewed today:  Echo  Nov 2024  1. Left ventricular ejection fraction, by estimation, is 50 to 55% . The left ventricle has low normal function. The left ventricle demonstrates regional wall motion abnormalities ( see scoring diagram/ findings for description) . Left ventricular diastolic parameters are indeterminate. There is mild hypokinesis of the left ventricular, entire anteroseptal wall and inferoseptal wall. 2. Right ventricular systolic function is normal. The right ventricular size is normal. 3. The mitral valve is normal in structure. Trivial mitral valve regurgitation. No evidence of mitral stenosis. 4. The aortic valve is tricuspid. There is mild calcification of the aortic valve. Aortic valve regurgitation is not visualized. Aortic valve sclerosis is present, with no evidence of aortic valve stenosis. 5. The inferior vena cava is normal in size with greater than 50% respiratory variability, suggesting right atrial pressure of 3 mmHg.  Echocardiogram 09/20/20 EF 65-70, no RWMA, mild LVH, normal RVSF, trivial MR, mild AV calcification, mild AV sclerosis without stenosis, borderline dilation of ascending aorta (38 mm)   Carotid US 09/16/20 Bilateral ICA 1-39    LONG TERM MONITOR (3-7 DAYS) INTERPRETATION 08/29/2020 Narrative  Patient had a minimum heart  rate of 53 bpm, maximum heart rate of 141 bpm, and average heart rate of 72 bpm.  Predominant underlying rhythm was sinus rhythm.  One run of supraventricular tachycardia occurred lasting 15 beats at longest with a max rate of 141 bpm at fastest.  Isolated PACs were rare (<1.0%)  Isolated PVCs were rare (<1.0%).  First degree heart block noted.  Triggered and diary events associated with sinus rhythm. No malignant arrhythmias.   GATED SPECT MYO PERF W/LEXISCAN STRESS 1D 11/18/2019 Narrative 1. Normal study without ischemia or infarction. 2. Normal LVEF, 63%. 3. This is a low-risk study.   LEFT HEART CATH AND CORONARY ANGIOGRAPHY 03/22/2016 Narrative  Widely patent coronary arteries with 30-50% mid LAD narrowing and 40% mid RCA narrowing. Coronary arteries are tortuous.  Right dominant coronary anatomy. LAD wraps around the left ventricular apex.  Normal LV function. Normal LVEDP. EF 60%.  Suspect chest discomfort is not ischemic.   Carotid US 5/14:   < 39% bilateral ICA stenosis.    Holter monitor x 24 hours 07/2012:  NSR with PVCs.     EKG:  EKG shows SR    First degree AV block   Incomp RBBB  septal MI    Recent Labs: 08/28/2022: ALT 14; BUN 24; Creatinine, Ser 1.23; Hemoglobin 14.3; Platelets 140.0; Potassium 4.0; Sodium 137; TSH 2.44  Recent Lipid Panel    Component Value Date/Time   CHOL 178 08/28/2022 1135   CHOL  135 10/01/2018 1036   TRIG 90.0 08/28/2022 1135   HDL 97.60 08/28/2022 1135   HDL 84 10/01/2018 1036   CHOLHDL 2 08/28/2022 1135   VLDL 18.0 08/28/2022 1135   LDLCALC 62 08/28/2022 1135   LDLCALC 34 10/01/2018 1036   LDLDIRECT 126.2 12/07/2010 1039      Physical Exam:    VS:  BP 110/80   Pulse 71   Ht 5\' 2"  (1.575 m)   Wt 207 lb (93.9 kg)   SpO2 96%   BMI 37.86 kg/m     Wt Readings from Last 3 Encounters:  12/10/22 207 lb (93.9 kg)  08/28/22 203 lb 6.4 oz (92.3 kg)  05/22/22 204 lb (92.5 kg)     GEN:   Obese 82 yo in no acute  distress HEENT: Normal NECK: No JVD; No carotid bruits CARDIAC: RRR,  No murmurs  RESPIRATORY:  Clear to auscultation No wheezes   ABDOMEN: Soft, non-tender, no hepatomegaly        PLAN:     #CAD Cardiac cath 2018 with 40% mid RCA 50% mid LAD treated medically.  Stress test 10/2019 with no evidence of ischemia or infarction.  Continue medical Rx -Continue plavix 75mg  daily -Continue losartan 25mg  daily -Continue imdur 90mg  daily -Continue bystolic 10mg  daily -Continue repatha 140mg  q2 weeks -Continue ranexa 500mg  BID  #Palpitations: Improved. Pt denies   Follow   #Chronic Diastolic Heart Failure: Volume status is good    . -Continue torsemide 20mg  every other day -Continue losartan 25mg  daily -Continue farxiga 10mg  daily -Low Na diet  #HTN: BP is very well  controlled on current regimen -Continue imdur 90mg  daily -Continue bystolic 10mg  daily -Continue losartan 25mg  daily (did not tolerate higher dose due to hypotension) -Low Na diet  #HLD: -Continue repatha 140mg  q2 weeks -LDL very well controlled   62  in July 2024    #History of TIA: -Continue plavix 75mg  daily -Continue repatha as above  Will check BMET today   Medication Adjustments/Labs and Tests Ordered: Current medicines are reviewed at length with the patient today.  Concerns regarding medicines are outlined above.  Orders Placed This Encounter  Procedures   EKG 12-Lead   No orders of the defined types were placed in this encounter.   Patient Instructions    I,Coren O'Brien,acting as a scribe for Dietrich Pates, MD.,have documented all relevant documentation on the behalf of Dietrich Pates, MD,as directed by  Dietrich Pates, MD while in the presence of Dietrich Pates, MD.  I, Dietrich Pates, MD, have reviewed all documentation for this visit. The documentation on 12/10/22 for the exam, diagnosis, procedures, and orders are all accurate and complete.   Signed, Dietrich Pates, MD  12/10/2022 9:09 AM    [facility]

## 2022-12-10 ENCOUNTER — Ambulatory Visit: Payer: Medicare Other | Attending: Cardiology | Admitting: Internal Medicine

## 2022-12-10 ENCOUNTER — Encounter: Payer: Self-pay | Admitting: Internal Medicine

## 2022-12-10 VITALS — BP 110/80 | HR 71 | Ht 62.0 in | Wt 207.0 lb

## 2022-12-10 DIAGNOSIS — G459 Transient cerebral ischemic attack, unspecified: Secondary | ICD-10-CM | POA: Diagnosis not present

## 2022-12-10 DIAGNOSIS — I10 Essential (primary) hypertension: Secondary | ICD-10-CM | POA: Diagnosis not present

## 2022-12-10 DIAGNOSIS — R002 Palpitations: Secondary | ICD-10-CM | POA: Insufficient documentation

## 2022-12-10 LAB — BASIC METABOLIC PANEL
BUN/Creatinine Ratio: 17 (ref 12–28)
BUN: 24 mg/dL (ref 8–27)
CO2: 25 mmol/L (ref 20–29)
Calcium: 9 mg/dL (ref 8.7–10.3)
Chloride: 100 mmol/L (ref 96–106)
Creatinine, Ser: 1.45 mg/dL — ABNORMAL HIGH (ref 0.57–1.00)
Glucose: 107 mg/dL — ABNORMAL HIGH (ref 70–99)
Potassium: 4.2 mmol/L (ref 3.5–5.2)
Sodium: 138 mmol/L (ref 134–144)
eGFR: 36 mL/min/{1.73_m2} — ABNORMAL LOW (ref >59)

## 2022-12-10 NOTE — Patient Instructions (Addendum)
Medication Instructions:   *If you need a refill on your cardiac medications before your next appointment, please call your pharmacy*   Lab Work: bmet If you have labs (blood work) drawn today and your tests are completely normal, you will receive your results only by: MyChart Message (if you have MyChart) OR A paper copy in the mail If you have any lab test that is abnormal or we need to change your treatment, we will call you to review the results.   Testing/Procedures:    Follow-Up: At [facility], you and your health needs are our priority.  As part of our continuing mission to provide you with exceptional heart care, we have created designated Provider Care Teams.  These Care Teams include your primary Cardiologist (physician) and Advanced Practice Providers (APPs -  Physician Assistants and Nurse Practitioners) who all work together to provide you with the care you need, when you need it.  We recommend signing up for the patient portal called "MyChart".  Sign up information is provided on this After Visit Summary.  MyChart is used to connect with patients for Virtual Visits (Telemedicine).  Patients are able to view lab/test results, encounter notes, upcoming appointments, etc.  Non-urgent messages can be sent to your provider as well.   To learn more about what you can do with MyChart, go to [email or web address].    Your next appointment:   11 month(s)  Provider:   Dr Dietrich Pates      Other Instructions

## 2022-12-12 ENCOUNTER — Other Ambulatory Visit: Payer: Self-pay

## 2022-12-12 DIAGNOSIS — I5032 Chronic diastolic (congestive) heart failure: Secondary | ICD-10-CM

## 2022-12-12 MED ORDER — TORSEMIDE 20 MG PO TABS: ORAL_TABLET | ORAL | Status: DC

## 2022-12-14 ENCOUNTER — Other Ambulatory Visit: Payer: Self-pay

## 2022-12-14 MED ORDER — NITROGLYCERIN 0.4 MG SL SUBL: 0.4000 mg | SUBLINGUAL_TABLET | SUBLINGUAL | 11 refills | Status: AC | PRN

## 2022-12-19 ENCOUNTER — Ambulatory Visit: Payer: Medicare Other | Admitting: Podiatry

## 2022-12-19 ENCOUNTER — Encounter: Payer: Self-pay | Admitting: Podiatry

## 2022-12-19 DIAGNOSIS — B351 Tinea unguium: Secondary | ICD-10-CM | POA: Diagnosis not present

## 2022-12-19 DIAGNOSIS — L84 Corns and callosities: Secondary | ICD-10-CM | POA: Diagnosis not present

## 2022-12-19 DIAGNOSIS — M79675 Pain in left toe(s): Secondary | ICD-10-CM

## 2022-12-19 DIAGNOSIS — Z794 Long term (current) use of insulin: Secondary | ICD-10-CM | POA: Diagnosis not present

## 2022-12-19 DIAGNOSIS — E1142 Type 2 diabetes mellitus with diabetic polyneuropathy: Secondary | ICD-10-CM | POA: Diagnosis not present

## 2022-12-19 DIAGNOSIS — M79674 Pain in right toe(s): Secondary | ICD-10-CM

## 2022-12-20 ENCOUNTER — Telehealth: Payer: Self-pay | Admitting: Adult Health

## 2022-12-20 NOTE — Telephone Encounter (Signed)
Pt said [employer] will not cover Rimegepant Sulfate (NURTEC) 75 MG TBDP unless neurologist sends a letter saying that health requires a quick decision. Asking [employer] to pay their share of the cost. Cost of medication1,[phone]. Would like a call back

## 2022-12-21 ENCOUNTER — Telehealth: Payer: Self-pay

## 2022-12-21 ENCOUNTER — Other Ambulatory Visit (HOSPITAL_COMMUNITY): Payer: Self-pay

## 2022-12-21 NOTE — Telephone Encounter (Signed)
Pharmacy Patient Advocate Encounter   Received notification from Physician's Office that prior authorization for Nurtec 75MG  dispersible tablets is required/requested.   Insurance verification completed.   The patient is insured through [employer] .   Per test claim: PA required; PA submitted to above mentioned insurance via CoverMyMeds Key/confirmation #/EOC [id number] Status is pending

## 2022-12-21 NOTE — Telephone Encounter (Signed)
PA request has been Submitted. New Encounter created for follow up. For additional info see Pharmacy Prior Auth telephone encounter from 12/21/2022.

## 2022-12-24 NOTE — Telephone Encounter (Signed)
Pharmacy Patient Advocate Encounter  Received notification from [facility] that Prior Authorization for Nurtec 75MG  dispersible tablets has been DENIED.  Full denial letter will be uploaded to the media tab. See denial reason below.   PA #/Case ID/Reference #: PA Case ID #: [id number]

## 2022-12-24 NOTE — Telephone Encounter (Addendum)
I spoke with the patient and let her know that Nurtec has been denied by [facility] but Bernita Raisin is preferred.  The patient is amenable to trying Ubrelvy instead.  We discussed the instructions for use.  Patient told me that since the spring if she stoops or bends down, she gets dizzy and loses her balance easily and has to hold onto something.  She also mentioned that yesterday she was looking to untangle the cords on her blinds, and her vision disappeared but came right back when she brought her head back down.  She states she is staying hydrated. I asked if she has an eye doctor and she said Dr Nile Riggs just retired so she will need to find a new doctor. She still experiences the pain she has had for quite awhile across the base of her skull. 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 She also said that in the last year she has noticed that her right foot sometimes wants "to trip her" so she is going to be more mindful of foot placement when walking. She wondered if it had something to do with her last TIA but when I talked her about the timeline she confirmed that was back in 2013/2014. She is going to try to follow-up with orthopedics. She has had knee surgery, hip trouble in the past. I scheduled her a follow-up with Dr Terrace Arabia at her next availability on 03/28/23 at 11:30 AM.

## 2022-12-24 NOTE — Telephone Encounter (Signed)
Last time she was here- she wanted a referral to neurosurgery after MRI cervical spine. Did she see them? Is she still following-up? Certainly if she has any considering symptoms or sudden visual loss she should go to ED. Can she be worked in sooner with Dr. Terrace Arabia?

## 2022-12-25 NOTE — Telephone Encounter (Signed)
I called the pt back and LVM (ok per DPR) advising patient that I had spoken with [facility] NP.  If patient has any concerning symptoms or sudden visual loss she should go to the ER and call 911.  I also asked her if she was still seeing neurosurgery. I did advise her that regarding her symptoms she's been having, we can see her sooner on 02/05/23 with Dr Terrace Arabia.  There are some morning and afternoon availability.  I asked her to give [place] a call back as soon as possible.

## 2022-12-26 NOTE — Progress Notes (Signed)
Subjective:  Patient ID: Samantha [name], female    DOB: Jan 19, 1941,  MRN: [id number]  82 y.o. female presents at risk foot care with history of diabetic neuropathy and corn(s) L 3rd toe, callus(es) right foot and painful mycotic nails.  Pain interferes with ambulation. Aggravating factors include wearing enclosed shoe gear. Painful toenails interfere with ambulation. Aggravating factors include wearing enclosed shoe gear. Pain is relieved with periodic professional debridement. Painful corns and calluses are aggravated when weightbearing with and without shoegear. Pain is relieved with periodic professional debridement.  Chief Complaint  Patient presents with   Diabetes    [facility] BS - 104 A1C - 6.5   New problem(s): None   PCP is Pincus Sanes, MD , and last visit was August 28, 2022.  Allergies  Allergen Reactions   Bactrim [Sulfamethoxazole-Trimethoprim] Other (See Comments)    See OV 09-15-13, rash-tongue swelling due to bactrim ?   Cefuroxime Axetil Hives   Oxycodone Nausea And Vomiting   Pravastatin Other (See Comments)    "muscle breakdown" with profuse sweating   Terfenadine Hives and Other (See Comments)    Other reaction(s): Other (See Comments)  Other reaction(s): Not available   Zocor [Simvastatin] Other (See Comments)    2012 "Muscle breakdown " with profuse sweating   Tramadol Nausea And Vomiting   Atorvastatin Other (See Comments)    Pt reports muscle aches and joint pains   Cefuroxime Other (See Comments)   Lactose Intolerance (Gi) Diarrhea   Latex     blisters   Lime Flavor [Flavoring Agent] Diarrhea   Metformin And Related Diarrhea   Oxycodone Hcl Other (See Comments)   Pravastatin Sodium Other (See Comments)   Rosuvastatin Other (See Comments)    Pt reports "myalgias, fatigue, nosebleeds."   Rosuvastatin Calcium Other (See Comments)   Tape Other (See Comments)    blisters   Wound Dressing Adhesive Other (See Comments)    blisters    Review of  Systems: Negative except as noted in the HPI.   Objective:  Samantha [name] is a pleasant 82 y.o. female in NAD. AAO x 3.  Vascular Examination: Vascular status intact b/l with palpable pedal pulses. CFT immediate b/l. Pedal hair present. No edema. No pain with calf compression b/l. Skin temperature gradient WNL b/l. No varicosities noted. No cyanosis or clubbing noted.  Neurological Examination: Pt has subjective symptoms of neuropathy. Sensation grossly intact b/l with 10 gram monofilament. Vibratory sensation intact b/l.  Dermatological Examination: Pedal skin with normal turgor, texture and tone b/l. No open wounds nor interdigital macerations noted. Toenails 1-5 b/l thick, discolored, elongated with subungual debris and pain on dorsal palpation.   Hyperkeratotic lesion(s) L 3rd toe and right great toe.  No erythema, no edema, no drainage, no fluctuance.   Musculoskeletal Examination: Muscle strength 5/5 to b/l LE.  No pain, crepitus noted b/l. HAV with bunion deformity noted b/l LE. Hammertoe(s) noted to the left second digit and right second digit.  Radiographs: None  Last A1c:      Latest Ref Rng & Units 08/28/2022   11:35 AM 02/27/2022    1:48 PM  Hemoglobin A1C  Hemoglobin-A1c 4.6 - 6.5 % 6.5  6.4      Assessment:   1. Pain due to onychomycosis of toenails of both feet   2. Corns and callosities   3. Controlled type 2 diabetes mellitus with diabetic polyneuropathy, with long-term current use of insulin (HCC)    Plan:  -Consent given for treatment  as described below: -Examined patient. -Continue foot and shoe inspections daily. Monitor blood glucose per PCP/Endocrinologist's recommendations. -Continue supportive shoe gear daily. -Mycotic toenails 1-5 bilaterally were debrided in length and girth with sterile nail nippers and dremel without incident. -Corn(s) left third digit pared utilizing sterile scalpel blade without complication or incident. Total number  debrided=1. -Callus(es) R hallux pared utilizing sterile scalpel blade without complication or incident. Total number debrided =1. -Patient/POA to call should there be question/concern in the interim.  Return in about 3 months (around 03/21/2023).  Freddie Breech, DPM      [facility] LOCATION: [address], [place] [address]                   Office [phone]   [facility] LOCATION: [address] [place], [place] [address] Office [phone]

## 2023-01-01 ENCOUNTER — Other Ambulatory Visit: Payer: Self-pay | Admitting: Internal Medicine

## 2023-01-01 ENCOUNTER — Other Ambulatory Visit: Payer: Self-pay | Admitting: Physician Assistant

## 2023-01-01 MED ORDER — UBRELVY 100 MG PO TABS: ORAL_TABLET | ORAL | 2 refills | Status: DC

## 2023-01-01 NOTE — Addendum Note (Signed)
Addended by: Bertram Savin on: 01/01/2023 04:59 PM   Modules accepted: Orders

## 2023-01-01 NOTE — Telephone Encounter (Signed)
I spoke with the patient this evening.  She states she did not get my voicemail. I advised if she has any loss of vision or any concerning symptoms she should seek emergency room care immediately. She verbalized understanding and said that particular event only happened one time but she is trying to avoid looking up.  The patient said she saw neurosurgery but her nerve pain subsided and she did not have to have the injections they offered. 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  She had a "blind spot" in her field of vision surrounded by a jagged edge that was bright and it became larger and larger.  She states she did not have a headache afterwards but felt a little lightheaded and had to rest for about 30 minutes.  She states in the past she had one that was so severe EMS had to come. I did get her scheduled for a follow-up sooner with Dr. Terrace Arabia on 02/05/2023 to discuss her balance, dizziness, migraines. She verbalized appreciation for the call.

## 2023-01-02 ENCOUNTER — Other Ambulatory Visit: Payer: Self-pay | Admitting: Internal Medicine

## 2023-01-02 NOTE — Telephone Encounter (Signed)
Will see patient in Jan 2025.

## 2023-01-03 LAB — BASIC METABOLIC PANEL
BUN/Creatinine Ratio: 18 (ref 12–28)
BUN: 23 mg/dL (ref 8–27)
CO2: 22 mmol/L (ref 20–29)
Calcium: 8.9 mg/dL (ref 8.7–10.3)
Chloride: 100 mmol/L (ref 96–106)
Creatinine, Ser: 1.26 mg/dL — ABNORMAL HIGH (ref 0.57–1.00)
Glucose: 75 mg/dL (ref 70–99)
Potassium: 4 mmol/L (ref 3.5–5.2)
Sodium: 139 mmol/L (ref 134–144)
eGFR: 43 mL/min/{1.73_m2} — ABNORMAL LOW (ref >59)

## 2023-01-16 DIAGNOSIS — Z1231 Encounter for screening mammogram for malignant neoplasm of breast: Secondary | ICD-10-CM | POA: Diagnosis not present

## 2023-01-16 DIAGNOSIS — Z6836 Body mass index (BMI) 36.0-36.9, adult: Secondary | ICD-10-CM | POA: Diagnosis not present

## 2023-01-16 DIAGNOSIS — Z01419 Encounter for gynecological examination (general) (routine) without abnormal findings: Secondary | ICD-10-CM | POA: Diagnosis not present

## 2023-01-21 ENCOUNTER — Other Ambulatory Visit: Payer: Self-pay | Admitting: Physician Assistant

## 2023-01-28 ENCOUNTER — Emergency Department (HOSPITAL_BASED_OUTPATIENT_CLINIC_OR_DEPARTMENT_OTHER)
Admit: 2023-01-28 | Discharge: 2023-01-28 | Disposition: A | Discharge status: Home / Self Care | Payer: Medicare Other | Attending: Emergency Medicine | Admitting: Emergency Medicine

## 2023-01-28 ENCOUNTER — Other Ambulatory Visit: Payer: Self-pay

## 2023-01-28 ENCOUNTER — Telehealth: Payer: Self-pay | Admitting: Internal Medicine

## 2023-01-28 ENCOUNTER — Encounter (HOSPITAL_COMMUNITY): Payer: Self-pay

## 2023-01-28 ENCOUNTER — Ambulatory Visit: Payer: Self-pay | Admitting: Internal Medicine

## 2023-01-28 ENCOUNTER — Emergency Department (HOSPITAL_COMMUNITY)
Admission: EM | Admit: 2023-01-28 | Discharge: 2023-01-29 | Disposition: A | Discharge status: Home / Self Care | Payer: Medicare Other | Attending: Emergency Medicine | Admitting: Emergency Medicine

## 2023-01-28 DIAGNOSIS — I5032 Chronic diastolic (congestive) heart failure: Secondary | ICD-10-CM | POA: Insufficient documentation

## 2023-01-28 DIAGNOSIS — R233 Spontaneous ecchymoses: Secondary | ICD-10-CM | POA: Diagnosis not present

## 2023-01-28 DIAGNOSIS — Z79899 Other long term (current) drug therapy: Secondary | ICD-10-CM | POA: Diagnosis not present

## 2023-01-28 DIAGNOSIS — Z7984 Long term (current) use of oral hypoglycemic drugs: Secondary | ICD-10-CM | POA: Diagnosis not present

## 2023-01-28 DIAGNOSIS — Z7951 Long term (current) use of inhaled steroids: Secondary | ICD-10-CM | POA: Insufficient documentation

## 2023-01-28 DIAGNOSIS — Z7902 Long term (current) use of antithrombotics/antiplatelets: Secondary | ICD-10-CM | POA: Insufficient documentation

## 2023-01-28 DIAGNOSIS — J45909 Unspecified asthma, uncomplicated: Secondary | ICD-10-CM | POA: Diagnosis not present

## 2023-01-28 DIAGNOSIS — I110 Hypertensive heart disease with heart failure: Secondary | ICD-10-CM | POA: Insufficient documentation

## 2023-01-28 DIAGNOSIS — I4891 Unspecified atrial fibrillation: Secondary | ICD-10-CM | POA: Insufficient documentation

## 2023-01-28 DIAGNOSIS — I2510 Atherosclerotic heart disease of native coronary artery without angina pectoris: Secondary | ICD-10-CM | POA: Insufficient documentation

## 2023-01-28 DIAGNOSIS — Z8673 Personal history of transient ischemic attack (TIA), and cerebral infarction without residual deficits: Secondary | ICD-10-CM | POA: Diagnosis not present

## 2023-01-28 DIAGNOSIS — Z91040 Latex allergy status: Secondary | ICD-10-CM | POA: Insufficient documentation

## 2023-01-28 DIAGNOSIS — M79661 Pain in right lower leg: Secondary | ICD-10-CM | POA: Diagnosis not present

## 2023-01-28 DIAGNOSIS — Z8544 Personal history of malignant neoplasm of other female genital organs: Secondary | ICD-10-CM | POA: Insufficient documentation

## 2023-01-28 DIAGNOSIS — M79604 Pain in right leg: Secondary | ICD-10-CM

## 2023-01-28 DIAGNOSIS — R58 Hemorrhage, not elsewhere classified: Secondary | ICD-10-CM

## 2023-01-28 DIAGNOSIS — Z85038 Personal history of other malignant neoplasm of large intestine: Secondary | ICD-10-CM | POA: Insufficient documentation

## 2023-01-28 LAB — BASIC METABOLIC PANEL
Anion gap: 10 (ref 5–15)
BUN: 17 mg/dL (ref 8–23)
CO2: 27 mmol/L (ref 22–32)
Calcium: 9.3 mg/dL (ref 8.9–10.3)
Chloride: 100 mmol/L (ref 98–111)
Creatinine, Ser: 1.16 mg/dL — ABNORMAL HIGH (ref 0.44–1.00)
GFR, Estimated: 48 mL/min — ABNORMAL LOW (ref >60)
Glucose, Bld: 106 mg/dL — ABNORMAL HIGH (ref 70–99)
Potassium: 3.9 mmol/L (ref 3.5–5.1)
Sodium: 137 mmol/L (ref 135–145)

## 2023-01-28 LAB — CBC WITH DIFFERENTIAL/PLATELET
Abs Immature Granulocytes: 0.01 10*3/uL (ref 0.00–0.07)
Basophils Absolute: 0 10*3/uL (ref 0.0–0.1)
Basophils Relative: 1 %
Eosinophils Absolute: 0.2 10*3/uL (ref 0.0–0.5)
Eosinophils Relative: 5 %
HCT: 38.9 % (ref 36.0–46.0)
Hemoglobin: 12.8 g/dL (ref 12.0–15.0)
Immature Granulocytes: 0 %
Lymphocytes Relative: 38 %
Lymphs Abs: 1.4 10*3/uL (ref 0.7–4.0)
MCH: 28.6 pg (ref 26.0–34.0)
MCHC: 32.9 g/dL (ref 30.0–36.0)
MCV: 86.8 fL (ref 80.0–100.0)
Monocytes Absolute: 0.4 10*3/uL (ref 0.1–1.0)
Monocytes Relative: 10 %
Neutro Abs: 1.6 10*3/uL — ABNORMAL LOW (ref 1.7–7.7)
Neutrophils Relative %: 46 %
Platelets: 180 10*3/uL (ref 150–400)
RBC: 4.48 MIL/uL (ref 3.87–5.11)
RDW: 14.5 % (ref 11.5–15.5)
WBC: 3.6 10*3/uL — ABNORMAL LOW (ref 4.0–10.5)
nRBC: 0 % (ref 0.0–0.2)

## 2023-01-28 NOTE — Progress Notes (Signed)
Right lower extremity venous duplex has been completed. Preliminary results can be found in CV Proc through chart review.  Results were given to [facility].  01/28/23 7:11 PM Olen Cordial RVT

## 2023-01-28 NOTE — Telephone Encounter (Signed)
Copied from CRM [id number]. Topic: Clinical - Red Word Triage >> Jan 28, 2023  4:32 PM Fuller Mandril wrote: Red Word that prompted transfer to Nurse Triage: sever pain and swelling in ankle and leg. Big purple spot possible blood clot.   Chief Complaint: Leg pain/swelling Symptoms: Right legg swelling and pain Frequency: Constant 3-4 days Pertinent Negatives: Patient denies difficulty breathing Disposition: [x] ED /[] Urgent Care (no appt availability in office) / [] Appointment(In office/virtual)/ []  [facility]/ [] Home Care/ [] Refused Recommended Disposition /[] [place] [facility]/ []  Follow-up with PCP Additional Notes: Patient reports for the last 3-4 days she has been experiencing pain and swelling in her right leg. She reports she also has a bruise on her calf and is concerned about a blood clot. Patient advised to go to the ED now for evaluation. She is agreeable with this plan and states her sister is with her and will take her.    Reason for Disposition  [1] Can't walk or can barely walk AND [2] new-onset  Answer Assessment - Initial Assessment Questions 1. ONSET: "When did the swelling start?" (e.g., minutes, hours, days)     3-4 days ago 2. LOCATION: "What part of the leg is swollen?"  "Are both legs swollen or just one leg?"     Right leg 3. SEVERITY: "How bad is the swelling?" (e.g., localized; mild, moderate, severe)   - Localized: Small area of swelling localized to one leg.   - MILD pedal edema: Swelling limited to foot and ankle, pitting edema < 1/4 inch (6 mm) deep, rest and elevation eliminate most or all swelling.   - MODERATE edema: Swelling of lower leg to knee, pitting edema > 1/4 inch (6 mm) deep, rest and elevation only partially reduce swelling.   - SEVERE edema: Swelling extends above knee, facial or hand swelling present.      Half the size more than her other leg 4. REDNESS: "Does the swelling look red or infected?"     Yes, around ankle in front of the  leg  5. PAIN: "Is the swelling painful to touch?" If Yes, ask: "How painful is it?"   (Scale 1-10; mild, moderate or severe)     No pain currently  6. FEVER: "Do you have a fever?" If Yes, ask: "What is it, how was it measured, and when did it start?"      No 7. CAUSE: "What do you think is causing the leg swelling?"     Believes she may have a blood clot 8. MEDICAL HISTORY: "Do you have a history of blood clots (e.g., DVT), cancer, heart failure, kidney disease, or liver failure?"     Kidney failure and heart failure  9. RECURRENT SYMPTOM: "Have you had leg swelling before?" If Yes, ask: "When was the last time?" "What happened that time?"     No 10. OTHER SYMPTOMS: "Do you have any other symptoms?" (e.g., chest pain, difficulty breathing)       Low blood pressure 91/60 this morning  11. PREGNANCY: "Is there any chance you are pregnant?" "When was your last menstrual period?"       No  Protocols used: Leg Swelling and Edema-A-AH

## 2023-01-28 NOTE — Telephone Encounter (Signed)
Pt c/o swelling/edema: STAT if pt has developed SOB within 24 hours  If swelling, where is the swelling located? Right leg  How much weight have you gained and in what time span? NA  Have you gained 2 pounds in a day or 5 pounds in a week? NA  Do you have a log of your daily weights (if so, list)? No  Are you currently taking a fluid pill? Yes  Are you currently SOB? No  Have you traveled recently in a car or plane for an extended period of time? No  Pt states that leg swelling a has been going on for about a week as well as pain in calf. She also has been experiencing lightheadedness.

## 2023-01-28 NOTE — Telephone Encounter (Signed)
Tried to reach patient. Phones just rings x3

## 2023-01-28 NOTE — ED Triage Notes (Signed)
Pt c/o right leg painx4d. Pt denies injury. Pt has large ecchymotic area on posterior of right leg. Pt's PCP sent pt here to r/o blood clot in right leg. PT's right leg has 2+ swelling.

## 2023-01-29 ENCOUNTER — Telehealth: Payer: Self-pay

## 2023-01-29 DIAGNOSIS — M79604 Pain in right leg: Secondary | ICD-10-CM | POA: Diagnosis not present

## 2023-01-29 MED ORDER — HYDROCODONE-ACETAMINOPHEN 5-325 MG PO TABS
1.0000 | ORAL_TABLET | Freq: Once | ORAL | Status: AC
  Administered 2023-01-29: 1 via ORAL
  Filled 2023-01-29: qty 1

## 2023-01-29 NOTE — Transitions of Care (Post Inpatient/ED Visit) (Signed)
   01/29/2023  Name: REKA WIST MRN: [id number] DOB: 1940/05/08  Today's TOC FU Call Status: Today's TOC FU Call Status:: Unsuccessful Call (1st Attempt) Unsuccessful Call (1st Attempt) Date: 01/29/23  Attempted to reach the patient regarding the most recent Inpatient/ED visit.  Follow Up Plan: Additional outreach attempts will be made to reach the patient to complete the Transitions of Care (Post Inpatient/ED visit) call.   Signature Julian Lemmings, LPN [facility] Nurse Health Advisor Direct Dial [phone]

## 2023-01-29 NOTE — ED Provider Notes (Signed)
 [facility] Provider Note   CSN: [id number] Arrival date & time: 01/28/23  1825     History  Chief Complaint  Patient presents with   Leg Pain    Samantha [name] is a 82 y.o. female.  HPI     This is an 82 year old female who presents with right leg pain.  Noted a large ecchymotic area to the right posterior leg.  She has had pain for the last 4 days.  She does not acknowledge any specific injury.  She is on Plavix .  She states that the pain is sharp and radiates into her calf.  She has noted some swelling of the right ankle as well.  Denies any numbness or tingling of the foot.  Home Medications Prior to Admission medications   Medication Sig Start Date End Date Taking? Authorizing Provider  acetaminophen  (TYLENOL ) 500 MG tablet Take 500 mg by mouth every 6 (six) hours as needed for mild pain.     [provider]  albuterol  (PROVENTIL ) (2.5 MG/3ML) 0.083% nebulizer solution Take 3 mLs (2.5 mg total) by nebulization every 6 (six) hours as needed for wheezing or shortness of breath. 10/29/22   Geofm Glade PARAS, MD  budesonide -formoterol  (SYMBICORT ) 160-4.5 MCG/ACT inhaler Inhale 2 puffs into the lungs 2 (two) times daily. 02/27/22   Geofm Glade PARAS, MD  Calcium -Magnesium-Zinc (CAL-MAG-ZINC PO) Take 1 tablet by mouth daily.    [provider]  cetirizine  (ZYRTEC ) 10 MG tablet TAKE 1 TABLET(10 MG) BY MOUTH DAILY 08/28/22   Geofm Glade PARAS, MD  cholecalciferol  (VITAMIN D ) 1000 UNITS tablet Take 1,000 Units by mouth every morning.     [provider]  clopidogrel  (PLAVIX ) 75 MG tablet TAKE 1 TABLET(75 MG) BY MOUTH DAILY 10/08/22   Geofm Glade PARAS, MD  dapagliflozin  propanediol (FARXIGA ) 10 MG TABS tablet TAKE 1 TABLET(10 MG) BY MOUTH DAILY BEFORE BREAKFAST 01/02/23   Burns, Glade PARAS, MD  DULoxetine  (CYMBALTA ) 30 MG capsule TAKE 1 CAPSULE(30 MG) BY MOUTH DAILY 06/04/22   Sherryl Bouchard, NP  Evolocumab  (REPATHA  SURECLICK) 140  MG/ML SOAJ INJECT 1 PEN UNDER THE SKIN EVERY 14 DAYS 04/16/22   Hobart Powell BRAVO, MD  famotidine  (PEPCID ) 40 MG tablet Take 1 tablet (40 mg total) by mouth daily. 08/28/22   Geofm Glade PARAS, MD  glucose blood test strip Use as instructed 03/29/20   Geofm Glade PARAS, MD  hydroxychloroquine  (PLAQUENIL ) 200 MG tablet Take 400 mg by mouth daily.     [provider]  isosorbide  mononitrate (IMDUR ) 30 MG 24 hr tablet Take 3 tablets (90 mg total) by mouth daily. 07/23/22   Hobart Powell BRAVO, MD  Lancets [facility] ULTRASOFT) lancets Use as instructed 03/29/20   Geofm Glade PARAS, MD  losartan  (COZAAR ) 25 MG tablet TAKE 1 TABLET(25 MG) BY MOUTH DAILY 01/21/23   Lelon Hamilton T, PA-C  nebivolol  (BYSTOLIC ) 10 MG tablet TAKE 1 TABLET(10 MG) BY MOUTH DAILY 07/11/22   Hobart Powell BRAVO, MD  nitroGLYCERIN  (NITROSTAT ) 0.4 MG SL tablet Place 1 tablet (0.4 mg total) under the tongue every 5 (five) minutes as needed for chest pain. 12/14/22   Okey Vina GAILS, MD  Polyethyl Glycol-Propyl Glycol (SYSTANE) 0.4-0.3 % GEL ophthalmic gel Place 1 application into both eyes daily as needed (for dry eyes).     [provider]  pregabalin  (LYRICA ) 75 MG capsule TAKE 1 CAPSULE(75 MG) BY MOUTH THREE TIMES DAILY 01/02/23   Geofm Glade PARAS, MD  psyllium (METAMUCIL) 58.6 % powder Take 1 packet by mouth daily.    [provider]  ranolazine  (RANEXA ) 500 MG 12 hr tablet TAKE 1 TABLET(500 MG) BY MOUTH TWICE DAILY 01/01/23   Okey Vina GAILS, MD  torsemide  (DEMADEX ) 20 MG tablet Take one tablet by mouth every third day. 12/12/22   Okey Vina GAILS, MD  Ubrogepant  (UBRELVY ) 100 MG TABS Take 100 mg by mouth if needed at onset of migraine. May repeat in 2 hours if headache returns or persists. Max 200 mg per 24 hours. 01/01/23   Sherryl Bouchard, NP  VENTOLIN  HFA 108 (90 Base) MCG/ACT inhaler INHALE 2 PUFFS INTO THE LUNGS EVERY 6 HOURS AS NEEDED FOR WHEEZING OR SHORTNESS OF BREATH 11/07/20   Geofm Glade PARAS, MD      Allergies     Bactrim  [sulfamethoxazole -trimethoprim ], Cefuroxime axetil, Oxycodone , Pravastatin , Terfenadine, Zocor  [simvastatin ], Tramadol, Atorvastatin , Cefuroxime, Lactose intolerance (gi), Latex, Lime flavor [flavoring agent], Metformin  and related, Oxycodone  hcl, Pravastatin  sodium, Rosuvastatin , Rosuvastatin  calcium , Tape, and Wound dressing adhesive    Review of Systems   Review of Systems  Constitutional:  Negative for fever.  Cardiovascular:  Positive for leg swelling.  Musculoskeletal:        Leg pain  Skin:  Positive for color change.  All other systems reviewed and are negative.   Physical Exam Updated Vital Signs BP (!) 151/86 (BP Location: Left Arm)   Pulse 77   Temp 97.6 F (36.4 C)   Resp 17   Ht 1.575 m (5' 2)   Wt 93.9 kg   SpO2 100%   BMI 37.86 kg/m  Physical Exam Vitals and nursing note reviewed.  Constitutional:      Appearance: She is well-developed. She is obese. She is not ill-appearing.  HENT:     Head: Normocephalic and atraumatic.  Eyes:     Pupils: Pupils are equal, round, and reactive to light.  Cardiovascular:     Rate and Rhythm: Normal rate and regular rhythm.     Heart sounds: Normal heart sounds.  Pulmonary:     Effort: Pulmonary effort is normal. No respiratory distress.     Breath sounds: No wheezing.  Abdominal:     Palpations: Abdomen is soft.  Musculoskeletal:     Cervical back: Neck supple.     Comments: Bilateral lower extremity swelling noted right greater than left, large ecchymotic area noted of the right calf, compartments are soft, 2+ DP pulse, there is some isolated swelling to the right ankle as well  Skin:    General: Skin is warm and dry.  Neurological:     Mental Status: She is alert and oriented to person, place, and time.  Psychiatric:        Mood and Affect: Mood normal.     ED Results / Procedures / Treatments   Labs (all labs ordered are listed, but only abnormal results are displayed) Labs Reviewed  BASIC METABOLIC  PANEL - Abnormal; Notable for the following components:      Result Value   Glucose, Bld 106 (*)    Creatinine, Ser 1.16 (*)    GFR, Estimated 48 (*)    All other components within normal limits  CBC WITH DIFFERENTIAL/PLATELET - Abnormal; Notable for the following components:   WBC 3.6 (*)    Neutro Abs 1.6 (*)    All other components within normal limits    EKG None  Radiology VAS US  LOWER EXTREMITY VENOUS (DVT) (ONLY [facility] & [facility]) Result Date:  01/28/2023  Lower Venous DVT Study Patient Name:  Samantha [name] [facility]  Date of Exam:   01/28/2023 Medical Rec #: [id number]           Accession #:    [id number] Date of Birth: October 21, 1940           Patient Gender: F Patient Age:   42 years Exam Location:  [facility] Procedure:      VAS US  LOWER EXTREMITY VENOUS (DVT) Referring Phys: LYLE SMALL --------------------------------------------------------------------------------  Indications: Pain.  Risk Factors: None identified. Limitations: Poor ultrasound/tissue interface. Comparison Study: No prior studies. Performing Technologist: Cordella Collet RVT  Examination Guidelines: A complete evaluation includes B-mode imaging, spectral Doppler, color Doppler, and power Doppler as needed of all accessible portions of each vessel. Bilateral testing is considered an integral part of a complete examination. Limited examinations for reoccurring indications may be performed as noted. The reflux portion of the exam is performed with the patient in reverse Trendelenburg.  +---------+---------------+---------+-----------+----------+--------------+ RIGHT    CompressibilityPhasicitySpontaneityPropertiesThrombus Aging +---------+---------------+---------+-----------+----------+--------------+ CFV      Full           Yes      Yes                                 +---------+---------------+---------+-----------+----------+--------------+ SFJ      Full                                                         +---------+---------------+---------+-----------+----------+--------------+ FV Prox  Full                                                        +---------+---------------+---------+-----------+----------+--------------+ FV Mid   Full                                                        +---------+---------------+---------+-----------+----------+--------------+ FV DistalFull                                                        +---------+---------------+---------+-----------+----------+--------------+ PFV      Full                                                        +---------+---------------+---------+-----------+----------+--------------+ POP      Full           Yes      Yes                                 +---------+---------------+---------+-----------+----------+--------------+ PTV  Full                                                        +---------+---------------+---------+-----------+----------+--------------+ PERO     Full                                                        +---------+---------------+---------+-----------+----------+--------------+   +----+---------------+---------+-----------+----------+--------------+ LEFTCompressibilityPhasicitySpontaneityPropertiesThrombus Aging +----+---------------+---------+-----------+----------+--------------+ CFV Full           Yes      Yes                                 +----+---------------+---------+-----------+----------+--------------+    Summary: RIGHT: - There is no evidence of deep vein thrombosis in the lower extremity.  - No cystic structure found in the popliteal fossa.  LEFT: - No evidence of common femoral vein obstruction.   *See table(s) above for measurements and observations.    Preliminary     Procedures Procedures    Medications Ordered in ED Medications  HYDROcodone -acetaminophen  (NORCO/VICODIN) 5-325 MG per tablet 1 tablet (has no administration in time  range)    ED Course/ Medical Decision Making/ A&P                                 Medical Decision Making Amount and/or Complexity of Data Reviewed Labs: ordered.  Risk Prescription drug management.   This patient presents to the ED for concern of leg pain, this involves an extensive number of treatment options, and is a complaint that carries with it a high risk of complications and morbidity.  I considered the following differential and admission for this acute, potentially life threatening condition.  The differential diagnosis includes DVT, contusion secondary to trauma, spontaneous bleed  MDM:    This is a 82 year old female who presents with isolated right calf pain.  Does have evidence of ecchymosis over the posterior right calf but denies any obvious trauma.  She is nontoxic and vital signs are reassuring.  No chest pain.  DVT study is negative for acute venous clot.  Also negative for other abnormality.  Compartments are soft.  Suspect patient may have sustained trauma that she did not note and has some bleeding secondary to being on Plavix  versus a spontaneous vessel rupture.  Low suspicion for compartment syndrome.  Recommend compression, ice, elevation.  This will likely improve on its own.  Tylenol  for pain management.  (Labs, imaging, consults)  Labs: I Ordered, and personally interpreted labs.  The pertinent results include: CBC, BMP  Imaging Studies ordered: I ordered imaging studies including DVT study I independently visualized and interpreted imaging. I agree with the radiologist interpretation  Additional history obtained from chart review.  External records from outside source obtained and reviewed including prior evaluations  Cardiac Monitoring: The patient was not maintained on a cardiac monitor.  If on the cardiac monitor, I personally viewed and interpreted the cardiac monitored which showed an underlying rhythm of: N/A  Reevaluation: After the  interventions noted above, I reevaluated the patient and  found that they have :stayed the same  Social Determinants of Health:  lives independently  Disposition: Discharge  Co morbidities that complicate the patient evaluation  Past Medical History:  Diagnosis Date   A-fib [facility])    Abnormal CT of the chest 2008   last CT4-l 2009:  . No f/u suggested    Allergy    Asthma    CAD (coronary artery disease)    a. Coronary CTA 10/16: Coronary Ca score 211, mod non-obstructive CAD with LM mild plaque (25-50%), mid LAD 50-69%. b. Neg nuc 06/2015.   Cataract    BILATERAL-REMOVED   Chronic diastolic CHF (congestive heart failure) (HCC)    Collagen vascular disease (HCC)    arterial sclerosis per pt   Complication of anesthesia    trouble waking up   Fibromyalgia    GERD (gastroesophageal reflux disease)    H/O hiatal hernia    Hearing loss    left ear   Heart murmur    Hyperlipidemia    Hypertension    Kidney disease 04/04/2022   Malignant neoplasm of ascending colon (HCC) 2016   Minimally invasive right hemicolectomy to be done    Neuromuscular disorder (HCC)    FIBROMYALGIA   Ocular migraine    OSA (obstructive sleep apnea) 09/2007   dx w/ a sleep study, not on  CPAP   Osteoarthritis    Osteoporosis    Pneumonia    double in 2004   PONV (postoperative nausea and vomiting)    Reactive airway disease 01/29/2002   dx of pseudoasthma / vcd in 2005 and nl sprirometry History of dyspnea, 2011,  improved after several medications were changed around Question of COPD, disproved July 06, 2009 with nl pft's       Rheumatoid factor positive    Shingles 11/2009   Sleep apnea    Stroke (HCC)    TIA (transient ischemic attack)    x2 - on Plavix  for this   Torn rotator cuff    right worse than left, both are torn   Tumor, 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   Type II diabetes mellitus (HCC)    Vaginal cancer (HCC) 1994   Vaginal dysplasia      Medicines Meds ordered  this encounter  Medications   HYDROcodone -acetaminophen  (NORCO/VICODIN) 5-325 MG per tablet 1 tablet    Refill:  0    I have reviewed the patients home medicines and have made adjustments as needed  Problem List / ED Course: Problem List Items Addressed This Visit   None Visit Diagnoses       Right leg pain    -  Primary     Ecchymosis                       Final Clinical Impression(s) / ED Diagnoses Final diagnoses:  Right leg pain  Ecchymosis    Rx / DC Orders ED Discharge Orders     None         Bari Charmaine FALCON, MD 01/29/23 [phone]

## 2023-01-29 NOTE — Telephone Encounter (Signed)
Patient went to ED on yesterday for evaluation.

## 2023-01-29 NOTE — Discharge Instructions (Signed)
 You are seen today for right leg pain.  Your ultrasound imaging does not show any evidence of blood clot.  You have evidence of bruising or contusion of the right lower leg.  Sometimes this could be related to small trauma given that you are on Plavix .  However, this can also sometimes happen spontaneously.  Recommend compression and ice to the extremity.  Keep the extremity elevated.  Hopefully this will resolve on its own.  Take Tylenol  as needed for pain.

## 2023-02-01 NOTE — Telephone Encounter (Signed)
 Returned call to patient who states she's been seen in the hospital for this and everything was taken care of. She had a ruptured vein, no DVT, and is keeping leg elevated as instructed.

## 2023-02-04 ENCOUNTER — Telehealth: Payer: Self-pay | Admitting: Adult Health

## 2023-02-04 NOTE — Transitions of Care (Post Inpatient/ED Visit) (Signed)
   02/04/2023  Name: Samantha [name] MRN: [id number] DOB: [birth year]  Today's TOC FU Call Status: Today's TOC FU Call Status:: Unsuccessful Call (2nd Attempt) Unsuccessful Call (1st Attempt) Date: 01/29/23 Unsuccessful Call (2nd Attempt) Date: 02/04/23  Attempted to reach the patient regarding the most recent Inpatient/ED visit.  Follow Up Plan: Additional outreach attempts will be made to reach the patient to complete the Transitions of Care (Post Inpatient/ED visit) call.   Signature Julian Lemmings, LPN [facility] Nurse Health Advisor Direct Dial [phone]

## 2023-02-04 NOTE — Telephone Encounter (Signed)
 Pt r/s appt due to leg issues. Wait listed

## 2023-02-04 NOTE — Transitions of Care (Post Inpatient/ED Visit) (Signed)
   02/04/2023  Name: Samantha [name] MRN: [id number] DOB: 12-30-1940  Today's TOC FU Call Status: Today's TOC FU Call Status:: Unsuccessful Call (3rd Attempt) Unsuccessful Call (1st Attempt) Date: 01/29/23 Unsuccessful Call (2nd Attempt) Date: 02/04/23 Unsuccessful Call (3rd Attempt) Date: 02/04/23  Attempted to reach the patient regarding the most recent Inpatient/ED visit.  Follow Up Plan: No further outreach attempts will be made at this time. We have been unable to contact the patient.  Signature Julian Lemmings, LPN [facility] Nurse Health Advisor Direct Dial [phone]

## 2023-02-05 ENCOUNTER — Ambulatory Visit: Payer: Medicare Other | Admitting: Neurology

## 2023-02-07 ENCOUNTER — Other Ambulatory Visit (HOSPITAL_COMMUNITY): Payer: Self-pay

## 2023-02-07 ENCOUNTER — Telehealth: Payer: Self-pay | Admitting: Pharmacy Technician

## 2023-02-07 NOTE — Telephone Encounter (Signed)
 Pharmacy Patient Advocate Encounter  Received notification from [facility] that Prior Authorization for Ubrelvy 100MG  tablets has been APPROVED from 02/07/2023 to 01/29/2024   PA #/Case ID/Reference #: [id number] Key: Theotis Burrow

## 2023-02-19 ENCOUNTER — Telehealth: Payer: Self-pay | Admitting: Internal Medicine

## 2023-02-19 MED ORDER — TORSEMIDE 20 MG PO TABS: ORAL_TABLET | ORAL | 3 refills | Status: DC

## 2023-02-19 NOTE — Telephone Encounter (Signed)
*  STAT* If patient is at the pharmacy, call can be transferred to refill team.   1. Which medications need to be refilled? (please list name of each medication and dose if known) torsemide (DEMADEX) 20 MG tablet    2. Would you like to learn more about the convenience, safety, & potential cost savings by using the [facility]?     3. Are you open to using the [facility] (Type [facility]. ).   4. Which pharmacy/location (including street and city if local pharmacy) is medication to be sent to? [id number] - [place], [place] - [address] AT [facility] OF [address]    5. Do they need a 30 day or 90 day supply? 90

## 2023-02-19 NOTE — Telephone Encounter (Signed)
Pt's medication was sent to pt's pharmacy as requested. Confirmation received.  °

## 2023-02-21 ENCOUNTER — Other Ambulatory Visit: Payer: Self-pay | Admitting: Internal Medicine

## 2023-02-25 ENCOUNTER — Other Ambulatory Visit: Payer: Self-pay | Admitting: Internal Medicine

## 2023-02-26 ENCOUNTER — Encounter: Payer: Self-pay | Admitting: Internal Medicine

## 2023-02-26 NOTE — Progress Notes (Unsigned)
Subjective:    Patient ID: Samantha [name], female    DOB: 1940-02-06, 83 y.o.   MRN: [id number]     HPI Samantha [name] is here for follow up of her chronic medical problems.  She as been active, eating well.   Right leg is better - still discolored  but better and swelling is better  She loses her balance and vision when she looks upward.  Right anterior lower rib cage hours x 6 months - only with certain movements.  She does have scoliosis  Medications and allergies reviewed with patient and updated if appropriate.  Current Outpatient Medications on File Prior to Visit  Medication Sig Dispense Refill   acetaminophen (TYLENOL) 500 MG tablet Take 500 mg by mouth every 6 (six) hours as needed for mild pain.      albuterol (PROVENTIL) (2.5 MG/3ML) 0.083% nebulizer solution Take 3 mLs (2.5 mg total) by nebulization every 6 (six) hours as needed for wheezing or shortness of breath. 150 mL 0   budesonide-formoterol (SYMBICORT) 160-4.5 MCG/ACT inhaler Inhale 2 puffs into the lungs 2 (two) times daily. 1 each 3   Calcium-Magnesium-Zinc (CAL-MAG-ZINC PO) Take 1 tablet by mouth daily.     cetirizine (ZYRTEC) 10 MG tablet TAKE 1 TABLET(10 MG) BY MOUTH DAILY 90 tablet 3   cholecalciferol (VITAMIN D) 1000 UNITS tablet Take 1,000 Units by mouth every morning.      clopidogrel (PLAVIX) 75 MG tablet TAKE 1 TABLET(75 MG) BY MOUTH DAILY 90 tablet 1   dapagliflozin propanediol (FARXIGA) 10 MG TABS tablet TAKE 1 TABLET(10 MG) BY MOUTH DAILY BEFORE BREAKFAST 30 tablet 2   DULoxetine (CYMBALTA) 30 MG capsule TAKE 1 CAPSULE(30 MG) BY MOUTH DAILY 90 capsule 3   Evolocumab (REPATHA SURECLICK) 140 MG/ML SOAJ INJECT 1 PEN UNDER THE SKIN EVERY 14 DAYS 6 mL 3   famotidine (PEPCID) 40 MG tablet TAKE 1 TABLET(40 MG) BY MOUTH DAILY 90 tablet 1   glucose blood test strip Use as instructed 100 each 12   hydroxychloroquine (PLAQUENIL) 200 MG tablet Take 400 mg by mouth daily.      isosorbide mononitrate  (IMDUR) 30 MG 24 hr tablet Take 3 tablets (90 mg total) by mouth daily. 90 tablet 6   Lancets (ONETOUCH ULTRASOFT) lancets Use as instructed 100 each 12   losartan (COZAAR) 25 MG tablet TAKE 1 TABLET(25 MG) BY MOUTH DAILY 90 tablet 3   nebivolol (BYSTOLIC) 10 MG tablet TAKE 1 OZHYQM(57 MG) BY MOUTH DAILY 90 tablet 1   nitroGLYCERIN (NITROSTAT) 0.4 MG SL tablet Place 1 tablet (0.4 mg total) under the tongue every 5 (five) minutes as needed for chest pain. 25 tablet 11   Polyethyl Glycol-Propyl Glycol (SYSTANE) 0.4-0.3 % GEL ophthalmic gel Place 1 application into both eyes daily as needed (for dry eyes).      pregabalin (LYRICA) 75 MG capsule TAKE 1 CAPSULE(75 MG) BY MOUTH THREE TIMES DAILY 90 capsule 1   psyllium (METAMUCIL) 58.6 % powder Take 1 packet by mouth daily.     ranolazine (RANEXA) 500 MG 12 hr tablet TAKE 1 TABLET(500 MG) BY MOUTH TWICE DAILY 180 tablet 3   torsemide (DEMADEX) 20 MG tablet Take one tablet by mouth every third day. 24 tablet 3   Ubrogepant (UBRELVY) 100 MG TABS Take 100 mg by mouth if needed at onset of migraine. May repeat in 2 hours if headache returns or persists. Max 200 mg per 24 hours. 16 tablet 2   VENTOLIN  HFA 108 (90 Base) MCG/ACT inhaler INHALE 2 PUFFS INTO THE LUNGS EVERY 6 HOURS AS NEEDED FOR WHEEZING OR SHORTNESS OF BREATH 18 g 11   No current facility-administered medications on file prior to visit.     Review of Systems  Constitutional:  Negative for fever.  Respiratory:  Positive for wheezing. Negative for cough and shortness of breath.   Cardiovascular:  Positive for palpitations (palpitations and heart pounding) and leg swelling. Negative for chest pain.  Neurological:  Positive for numbness (fingertips) and headaches. Negative for light-headedness.       Objective:   Vitals:   02/27/23 1125  BP: 114/72  Pulse: 87  Temp: 98.2 F (36.8 C)  SpO2: 100%   BP Readings from Last 3 Encounters:  02/27/23 114/72  01/29/23 126/81  12/10/22  110/80   Wt Readings from Last 3 Encounters:  02/27/23 201 lb (91.2 kg)  01/28/23 207 lb 0.2 oz (93.9 kg)  12/10/22 207 lb (93.9 kg)   Body mass index is 36.76 kg/m.    Physical Exam Constitutional:      General: She is not in acute distress.    Appearance: Normal appearance.  HENT:     Head: Normocephalic and atraumatic.  Eyes:     Conjunctiva/sclera: Conjunctivae normal.  Cardiovascular:     Rate and Rhythm: Normal rate and regular rhythm.     Heart sounds: Normal heart sounds.  Pulmonary:     Effort: Pulmonary effort is normal. No respiratory distress.     Breath sounds: Normal breath sounds. No wheezing.  Musculoskeletal:        General: Tenderness (Tenderness with palpation right anterior lower rib) present.     Cervical back: Neck supple.     Right lower leg: Edema (trace) present.     Left lower leg: Edema (trace) present.  Lymphadenopathy:     Cervical: No cervical adenopathy.  Skin:    General: Skin is warm and dry.     Findings: No rash.  Neurological:     Mental Status: She is alert. Mental status is at baseline.  Psychiatric:        Mood and Affect: Mood normal.        Behavior: Behavior normal.        Lab Results  Component Value Date   WBC 3.6 (L) 01/28/2023   HGB 12.8 01/28/2023   HCT 38.9 01/28/2023   PLT 180 01/28/2023   GLUCOSE 106 (H) 01/28/2023   CHOL 178 08/28/2022   TRIG 90.0 08/28/2022   HDL 97.60 08/28/2022   LDLDIRECT 126.2 12/07/2010   LDLCALC 62 08/28/2022   ALT 14 08/28/2022   AST 16 08/28/2022   NA 137 01/28/2023   K 3.9 01/28/2023   CL 100 01/28/2023   CREATININE 1.16 (H) 01/28/2023   BUN 17 01/28/2023   CO2 27 01/28/2023   TSH 2.44 08/28/2022   INR 0.95 07/22/2017   HGBA1C 6.5 08/28/2022   MICROALBUR <0.7 02/27/2022     Assessment & Plan:    See Problem List for Assessment and Plan of chronic medical problems.

## 2023-02-26 NOTE — Patient Instructions (Addendum)
      Blood work was ordered.       Medications changes include :   None    A referral was ordered and someone will call you to schedule an appointment.     Return in about 6 months (around 08/27/2023) for follow up.

## 2023-02-27 ENCOUNTER — Ambulatory Visit (INDEPENDENT_AMBULATORY_CARE_PROVIDER_SITE_OTHER): Payer: Medicare Other | Admitting: Internal Medicine

## 2023-02-27 VITALS — BP 114/72 | HR 87 | Temp 98.2°F | Ht 62.0 in | Wt 201.0 lb

## 2023-02-27 DIAGNOSIS — E782 Mixed hyperlipidemia: Secondary | ICD-10-CM | POA: Diagnosis not present

## 2023-02-27 DIAGNOSIS — R0781 Pleurodynia: Secondary | ICD-10-CM | POA: Diagnosis not present

## 2023-02-27 DIAGNOSIS — I2510 Atherosclerotic heart disease of native coronary artery without angina pectoris: Secondary | ICD-10-CM | POA: Diagnosis not present

## 2023-02-27 DIAGNOSIS — Z23 Encounter for immunization: Secondary | ICD-10-CM | POA: Diagnosis not present

## 2023-02-27 DIAGNOSIS — M069 Rheumatoid arthritis, unspecified: Secondary | ICD-10-CM

## 2023-02-27 DIAGNOSIS — G450 Vertebro-basilar artery syndrome: Secondary | ICD-10-CM

## 2023-02-27 DIAGNOSIS — G629 Polyneuropathy, unspecified: Secondary | ICD-10-CM | POA: Diagnosis not present

## 2023-02-27 DIAGNOSIS — J4530 Mild persistent asthma, uncomplicated: Secondary | ICD-10-CM | POA: Diagnosis not present

## 2023-02-27 DIAGNOSIS — K219 Gastro-esophageal reflux disease without esophagitis: Secondary | ICD-10-CM | POA: Diagnosis not present

## 2023-02-27 DIAGNOSIS — G43709 Chronic migraine without aura, not intractable, without status migrainosus: Secondary | ICD-10-CM

## 2023-02-27 DIAGNOSIS — Z794 Long term (current) use of insulin: Secondary | ICD-10-CM

## 2023-02-27 DIAGNOSIS — I10 Essential (primary) hypertension: Secondary | ICD-10-CM

## 2023-02-27 DIAGNOSIS — I872 Venous insufficiency (chronic) (peripheral): Secondary | ICD-10-CM | POA: Diagnosis not present

## 2023-02-27 DIAGNOSIS — N1831 Chronic kidney disease, stage 3a: Secondary | ICD-10-CM | POA: Diagnosis not present

## 2023-02-27 DIAGNOSIS — E1142 Type 2 diabetes mellitus with diabetic polyneuropathy: Secondary | ICD-10-CM

## 2023-02-27 MED ORDER — CETIRIZINE HCL 10 MG PO TABS: ORAL_TABLET | ORAL | 3 refills | Status: AC

## 2023-02-27 MED ORDER — UBRELVY 100 MG PO TABS: ORAL_TABLET | ORAL | 3 refills | Status: DC

## 2023-02-27 NOTE — Assessment & Plan Note (Signed)
Chronic Stable Cmp, cbc, urine albumin/creatinine ratio

## 2023-02-27 NOTE — Assessment & Plan Note (Signed)
Chronic Blood pressure is controlled Continue Imdur 90 mg daily, losartan 25 mg daily and Bystolic 10 mg daily Cmp

## 2023-02-27 NOTE — Assessment & Plan Note (Signed)
Chronic Lab Results  Component Value Date   HGBA1C 6.5 08/28/2022   Sugars are well-controlled Check A1c, urine microalbumin Continue Farxiga 10 mg daily

## 2023-02-27 NOTE — Assessment & Plan Note (Signed)
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

## 2023-02-27 NOTE — Assessment & Plan Note (Signed)
Chronic Follows with cardiology No symptoms c/w angina Continue current medications

## 2023-02-27 NOTE — Assessment & Plan Note (Signed)
Chronic Following with neuro Taking ubrelvy as needed Overall controlled

## 2023-02-27 NOTE — Telephone Encounter (Signed)
Pt called stating that she received a letter from [facility] with this approval so she is wanting to know if the Rx can be sent to the [facility] on [address]

## 2023-02-27 NOTE — Assessment & Plan Note (Signed)
Chronic Check lipid panel  Continue repatha 140 mg q 14 days Regular exercise and healthy diet encouraged

## 2023-02-27 NOTE — Assessment & Plan Note (Signed)
Chronic Overall controlled Continue albuterol prn Continue symbicort to 160-4.5 mcg bid

## 2023-02-27 NOTE — Assessment & Plan Note (Signed)
Chronic Controlled Continue Lyrica 75 mg 3 times daily-also taking for occipital neuralgia Continue Cymbalta 30 mg daily

## 2023-02-27 NOTE — Assessment & Plan Note (Signed)
Chronic This is likely the cause of her losing balance and vision when she looks up She has an upcoming appointment with Dr. Terrace Arabia

## 2023-02-27 NOTE — Assessment & Plan Note (Signed)
Chronic GERD controlled Continue pepcid 40 mg daily prn

## 2023-02-27 NOTE — Addendum Note (Signed)
Addended by: Guy Begin on: 02/27/2023 04:36 PM   Modules accepted: Orders

## 2023-02-27 NOTE — Assessment & Plan Note (Signed)
Chronic Following with Dr Kathi Ludwig

## 2023-02-27 NOTE — Assessment & Plan Note (Addendum)
Chronic Swelling Elevates legs,wears compression socks Torsemide 20 mg every 3 days

## 2023-03-04 ENCOUNTER — Other Ambulatory Visit (INDEPENDENT_AMBULATORY_CARE_PROVIDER_SITE_OTHER): Payer: Medicare Other

## 2023-03-04 DIAGNOSIS — I2510 Atherosclerotic heart disease of native coronary artery without angina pectoris: Secondary | ICD-10-CM

## 2023-03-04 DIAGNOSIS — E1142 Type 2 diabetes mellitus with diabetic polyneuropathy: Secondary | ICD-10-CM

## 2023-03-04 DIAGNOSIS — I2583 Coronary atherosclerosis due to lipid rich plaque: Secondary | ICD-10-CM

## 2023-03-04 DIAGNOSIS — Z794 Long term (current) use of insulin: Secondary | ICD-10-CM

## 2023-03-04 DIAGNOSIS — I10 Essential (primary) hypertension: Secondary | ICD-10-CM | POA: Diagnosis not present

## 2023-03-04 LAB — COMPREHENSIVE METABOLIC PANEL
ALT: 11 U/L (ref 0–35)
AST: 14 U/L (ref 0–37)
Albumin: 4 g/dL (ref 3.5–5.2)
Alkaline Phosphatase: 53 U/L (ref 39–117)
BUN: 20 mg/dL (ref 6–23)
CO2: 27 meq/L (ref 19–32)
Calcium: 8.6 mg/dL (ref 8.4–10.5)
Chloride: 105 meq/L (ref 96–112)
Creatinine, Ser: 1.18 mg/dL (ref 0.40–1.20)
GFR: 43.53 mL/min — ABNORMAL LOW (ref >60.00)
Glucose, Bld: 99 mg/dL (ref 70–99)
Potassium: 4.2 meq/L (ref 3.5–5.1)
Sodium: 139 meq/L (ref 135–145)
Total Bilirubin: 0.4 mg/dL (ref 0.2–1.2)
Total Protein: 6.5 g/dL (ref 6.0–8.3)

## 2023-03-04 LAB — LIPID PANEL
Cholesterol: 124 mg/dL (ref 0–200)
HDL: 70.2 mg/dL (ref >39.00)
LDL Cholesterol: 20 mg/dL (ref 0–99)
NonHDL: 53.85
Total CHOL/HDL Ratio: 2
Triglycerides: 169 mg/dL — ABNORMAL HIGH (ref 0.0–149.0)
VLDL: 33.8 mg/dL (ref 0.0–40.0)

## 2023-03-04 LAB — HEMOGLOBIN A1C: Hgb A1c MFr Bld: 6.3 % (ref 4.6–6.5)

## 2023-03-05 ENCOUNTER — Encounter: Payer: Self-pay | Admitting: Internal Medicine

## 2023-03-05 LAB — MICROALBUMIN / CREATININE URINE RATIO
Creatinine,U: 52.8 mg/dL
Microalb Creat Ratio: 1.7 mg/g (ref 0.0–30.0)
Microalb, Ur: 0.9 mg/dL (ref 0.0–1.9)

## 2023-03-28 ENCOUNTER — Ambulatory Visit: Payer: Medicare Other | Admitting: Neurology

## 2023-04-08 ENCOUNTER — Other Ambulatory Visit: Payer: Self-pay | Admitting: Internal Medicine

## 2023-04-16 DIAGNOSIS — G5600 Carpal tunnel syndrome, unspecified upper limb: Secondary | ICD-10-CM | POA: Diagnosis not present

## 2023-04-16 DIAGNOSIS — M542 Cervicalgia: Secondary | ICD-10-CM | POA: Diagnosis not present

## 2023-04-16 DIAGNOSIS — M79643 Pain in unspecified hand: Secondary | ICD-10-CM | POA: Diagnosis not present

## 2023-04-16 DIAGNOSIS — E669 Obesity, unspecified: Secondary | ICD-10-CM | POA: Diagnosis not present

## 2023-04-16 DIAGNOSIS — I7300 Raynaud's syndrome without gangrene: Secondary | ICD-10-CM | POA: Diagnosis not present

## 2023-04-16 DIAGNOSIS — N289 Disorder of kidney and ureter, unspecified: Secondary | ICD-10-CM | POA: Diagnosis not present

## 2023-04-16 DIAGNOSIS — Z79899 Other long term (current) drug therapy: Secondary | ICD-10-CM | POA: Diagnosis not present

## 2023-04-16 DIAGNOSIS — M79644 Pain in right finger(s): Secondary | ICD-10-CM | POA: Diagnosis not present

## 2023-04-16 DIAGNOSIS — M797 Fibromyalgia: Secondary | ICD-10-CM | POA: Diagnosis not present

## 2023-04-16 DIAGNOSIS — M1990 Unspecified osteoarthritis, unspecified site: Secondary | ICD-10-CM | POA: Diagnosis not present

## 2023-04-16 DIAGNOSIS — M069 Rheumatoid arthritis, unspecified: Secondary | ICD-10-CM | POA: Diagnosis not present

## 2023-04-16 DIAGNOSIS — I10 Essential (primary) hypertension: Secondary | ICD-10-CM | POA: Diagnosis not present

## 2023-04-19 ENCOUNTER — Telehealth: Payer: Self-pay | Admitting: Adult Health

## 2023-04-19 NOTE — Telephone Encounter (Signed)
 Pt is experiencing shaking as a result of her nerve pain.  Pt is asking for a call to discuss something other than the DULoxetine (CYMBALTA) 30 MG capsule , please call pt to discuss.

## 2023-04-22 NOTE — Telephone Encounter (Signed)
 I called her home #.  Could not LM.  I called her mobile.  I relayed that relating to her message on Friday, she may address with Dr. Terrace Arabia on her upcoming appt in 05/09/2023 with Dr. Terrace Arabia.  Wanted to her her know that we tried her other # and no option to LM.

## 2023-04-24 ENCOUNTER — Other Ambulatory Visit: Payer: Self-pay

## 2023-04-24 ENCOUNTER — Telehealth: Payer: Self-pay | Admitting: Internal Medicine

## 2023-04-24 MED ORDER — NEBIVOLOL HCL 10 MG PO TABS: ORAL_TABLET | ORAL | 2 refills | Status: DC

## 2023-04-24 NOTE — Telephone Encounter (Signed)
*  STAT* If patient is at the pharmacy, call can be transferred to refill team.   1. Which medications need to be refilled? (please list name of each medication and dose if known) nebivolol (BYSTOLIC) 10 MG tablet    2. Would you like to learn more about the convenience, safety, & potential cost savings by using the [facility]?      3. Are you open to using the [facility] (Type [facility].   4. Which pharmacy/location (including street and city if local pharmacy) is medication to be sent to?[id number] - [place], [place] - [address] AT [facility] OF [address]    5. Do they need a 30 day or 90 day supply? 90

## 2023-04-24 NOTE — Telephone Encounter (Signed)
 Pt's medication was sent to pt's pharmacy as requested. Confirmation received.

## 2023-04-30 ENCOUNTER — Ambulatory Visit (INDEPENDENT_AMBULATORY_CARE_PROVIDER_SITE_OTHER): Payer: Medicare Other | Admitting: Podiatry

## 2023-04-30 ENCOUNTER — Encounter: Payer: Self-pay | Admitting: Podiatry

## 2023-04-30 DIAGNOSIS — Z794 Long term (current) use of insulin: Secondary | ICD-10-CM | POA: Diagnosis not present

## 2023-04-30 DIAGNOSIS — B351 Tinea unguium: Secondary | ICD-10-CM | POA: Diagnosis not present

## 2023-04-30 DIAGNOSIS — E1142 Type 2 diabetes mellitus with diabetic polyneuropathy: Secondary | ICD-10-CM

## 2023-04-30 DIAGNOSIS — M79674 Pain in right toe(s): Secondary | ICD-10-CM

## 2023-04-30 DIAGNOSIS — M79675 Pain in left toe(s): Secondary | ICD-10-CM | POA: Diagnosis not present

## 2023-04-30 DIAGNOSIS — L84 Corns and callosities: Secondary | ICD-10-CM

## 2023-05-02 NOTE — Progress Notes (Signed)
 Subjective:  Patient ID: Samantha [name], female    DOB: 08-Sep-1940,  MRN: [id number]  83 y.o. female presents at risk foot care with history of diabetic neuropathy and corn(s) left foot, callus(es) right foot and painful elongated, discolored, dystrophic nails.  Pain interferes with ambulation. Aggravating factors include wearing enclosed shoe gear. Painful toenails interfere with ambulation. Aggravating factors include wearing enclosed shoe gear. Pain is relieved with periodic professional debridement. Painful corns and calluses are aggravated when weightbearing with and without shoegear. Pain is relieved with periodic professional debridement.  Chief Complaint  Patient presents with   [facility]    My last A1C was 6.3, I am here for diabetic nail trim and seen 2-3 times a year.   New problem(s): None   PCP is Pincus Sanes, MD , and last visit was February 27, 2023.  Allergies  Allergen Reactions   Bactrim [Sulfamethoxazole-Trimethoprim] Other (See Comments)    See OV 09-15-13, rash-tongue swelling due to bactrim ?   Cefuroxime Axetil Hives   Oxycodone Nausea And Vomiting   Pravastatin Other (See Comments)    "muscle breakdown" with profuse sweating   Terfenadine Hives and Other (See Comments)    Other reaction(s): Other (See Comments)  Other reaction(s): Not available   Zocor [Simvastatin] Other (See Comments)    2012 "Muscle breakdown " with profuse sweating   Tramadol Nausea And Vomiting   Atorvastatin Other (See Comments)    Pt reports muscle aches and joint pains   Cefuroxime Other (See Comments)   Lactose Intolerance (Gi) Diarrhea   Latex     blisters   Lime Flavor [Flavoring Agent (Non-Screening)] Diarrhea   Metformin And Related Diarrhea   Oxycodone Hcl Other (See Comments)   Pravastatin Sodium Other (See Comments)   Rosuvastatin Other (See Comments)    Pt reports "myalgias, fatigue, nosebleeds."   Rosuvastatin Calcium Other (See Comments)   Tape Other (See Comments)     blisters   Wound Dressing Adhesive Other (See Comments)    blisters    Review of Systems: Negative except as noted in the HPI.   Objective:  CHALENE TREU is a pleasant 83 y.o. female WD, WN in NAD. AAO x 3. Vascular Examination: Vascular status intact b/l with palpable pedal pulses. CFT immediate b/l. Pedal hair present. No edema. No pain with calf compression b/l. Skin temperature gradient WNL b/l. No varicosities noted. No cyanosis or clubbing noted.  Neurological Examination: Pt has subjective symptoms of neuropathy. Sensation grossly intact b/l with 10 gram monofilament. Vibratory sensation intact b/l.  Dermatological Examination: Pedal skin with normal turgor, texture and tone b/l. No open wounds nor interdigital macerations noted. Toenails 1-5 b/l thick, discolored, elongated with subungual debris and pain on dorsal palpation.   Hyperkeratotic lesion(s) L 3rd toe and right great toe.  No erythema, no edema, no drainage, no fluctuance.   Musculoskeletal Examination: Muscle strength 5/5 to b/l LE.  No pain, crepitus noted b/l. HAV with bunion deformity noted b/l LE. Hammertoe(s) noted to the left second digit and right second digit.  Radiographs: None  Last A1c:      Latest Ref Rng & Units 03/04/2023    4:17 PM 08/28/2022   11:35 AM  Hemoglobin A1C  Hemoglobin-A1c 4.6 - 6.5 % 6.3  6.5      Assessment:   1. Pain due to onychomycosis of toenails of both feet   2. Corns and callosities   3. Controlled type 2 diabetes mellitus with diabetic polyneuropathy, with long-term  current use of insulin (HCC)    Plan:  Patient was evaluated and treated. All patient's and/or POA's questions/concerns addressed on today's visit. Toenails 1-5 debrided in length and girth without incident. Corn(s) and Callus(es) left third digit and right great toe pared with sharp debridement without incident. Continue soft, supportive shoe gear daily. Report any pedal injuries to medical  professional. Call office if there are any questions/concerns. -Patient/POA to call should there be question/concern in the interim.  Return in about 3 months (around 07/30/2023).  Freddie Breech, DPM      [facility] LOCATION: [address], [place] [address]                   Office [phone]   [facility] LOCATION: [address] [place], [place] [address] Office [phone]

## 2023-05-03 ENCOUNTER — Ambulatory Visit

## 2023-05-03 VITALS — Ht 62.0 in | Wt 195.0 lb

## 2023-05-03 DIAGNOSIS — Z0000 Encounter for general adult medical examination without abnormal findings: Secondary | ICD-10-CM

## 2023-05-03 DIAGNOSIS — E119 Type 2 diabetes mellitus without complications: Secondary | ICD-10-CM | POA: Diagnosis not present

## 2023-05-03 DIAGNOSIS — Z0100 Encounter for examination of eyes and vision without abnormal findings: Secondary | ICD-10-CM

## 2023-05-03 NOTE — Progress Notes (Signed)
 Subjective:   Samantha [name] is a 83 y.o. who presents for a Medicare Wellness preventive visit.  Visit Complete: Virtual I connected with  Lyssa Hackley Huffstetler on 05/03/23 by a audio enabled telemedicine application and verified that I am speaking with the correct person using two identifiers.  Patient Location: Home  Provider Location: Office/Clinic  I discussed the limitations of evaluation and management by telemedicine. The patient expressed understanding and agreed to proceed.  Vital Signs: Because this visit was a virtual/telehealth visit, some criteria may be missing or patient reported. Any vitals not documented were not able to be obtained and vitals that have been documented are patient reported.  VideoDeclined- This patient declined Librarian, academic. Therefore the visit was completed with audio only.  Persons Participating in Visit: Patient.  AWV Questionnaire: No: Patient Medicare AWV questionnaire was not completed prior to this visit.  Cardiac Risk Factors include: advanced age (>43men, >31 women);diabetes mellitus;dyslipidemia;hypertension;obesity (BMI >30kg/m2)     Objective:    Today's Vitals   05/03/23 1306  Weight: 195 lb (88.5 kg)  Height: 5\' 2"  (1.575 m)  PainSc: 7    Body mass index is 35.67 kg/m.     05/03/2023    1:04 PM 07/04/2022   12:07 PM 12/19/2021   11:24 AM 09/29/2021    4:37 PM 12/16/2020   11:29 AM 06/24/2020    5:30 PM 12/04/2019    6:40 AM  Advanced Directives  Does Patient Have a Medical Advance Directive? Yes Yes Yes No Yes No No  Type of Estate agent of [place];Living will Healthcare Power of [place];Living will Healthcare Power of [place];Living will  Healthcare Power of [place];Living will    Does patient want to make changes to medical advance directive?       No - Patient declined  Copy of Healthcare Power of Attorney in Chart? No - copy requested No - copy requested No -  copy requested  No - copy requested    Would patient like information on creating a medical advance directive?       No - Patient declined    Current Medications (verified) Outpatient Encounter Medications as of 05/03/2023  Medication Sig   acetaminophen (TYLENOL) 500 MG tablet Take 500 mg by mouth every 6 (six) hours as needed for mild pain.    albuterol (PROVENTIL) (2.5 MG/3ML) 0.083% nebulizer solution Take 3 mLs (2.5 mg total) by nebulization every 6 (six) hours as needed for wheezing or shortness of breath.   budesonide-formoterol (SYMBICORT) 160-4.5 MCG/ACT inhaler Inhale 2 puffs into the lungs 2 (two) times daily.   Calcium-Magnesium-Zinc (CAL-MAG-ZINC PO) Take 1 tablet by mouth daily.   cetirizine (ZYRTEC) 10 MG tablet TAKE 1 TABLET(10 MG) BY MOUTH DAILY   cholecalciferol (VITAMIN D) 1000 UNITS tablet Take 1,000 Units by mouth every morning.    clopidogrel (PLAVIX) 75 MG tablet TAKE 1 TABLET(75 MG) BY MOUTH DAILY   dapagliflozin propanediol (FARXIGA) 10 MG TABS tablet TAKE 1 TABLET(10 MG) BY MOUTH DAILY BEFORE BREAKFAST   DULoxetine (CYMBALTA) 30 MG capsule TAKE 1 CAPSULE(30 MG) BY MOUTH DAILY   Evolocumab (REPATHA SURECLICK) 140 MG/ML SOAJ INJECT 1 PEN UNDER THE SKIN EVERY 14 DAYS   famotidine (PEPCID) 40 MG tablet TAKE 1 TABLET(40 MG) BY MOUTH DAILY   glucose blood test strip Use as instructed   hydroxychloroquine (PLAQUENIL) 200 MG tablet Take 400 mg by mouth daily.    isosorbide mononitrate (IMDUR) 30 MG 24 hr tablet Take  3 tablets (90 mg total) by mouth daily.   Lancets (ONETOUCH ULTRASOFT) lancets Use as instructed   losartan (COZAAR) 25 MG tablet TAKE 1 TABLET(25 MG) BY MOUTH DAILY   nebivolol (BYSTOLIC) 10 MG tablet TAKE 1 DDUKGU(54 MG) BY MOUTH DAILY   nitroGLYCERIN (NITROSTAT) 0.4 MG SL tablet Place 1 tablet (0.4 mg total) under the tongue every 5 (five) minutes as needed for chest pain.   Polyethyl Glycol-Propyl Glycol (SYSTANE) 0.4-0.3 % GEL ophthalmic gel Place 1  application into both eyes daily as needed (for dry eyes).    pregabalin (LYRICA) 75 MG capsule TAKE 1 CAPSULE(75 MG) BY MOUTH THREE TIMES DAILY   psyllium (METAMUCIL) 58.6 % powder Take 1 packet by mouth daily.   ranolazine (RANEXA) 500 MG 12 hr tablet TAKE 1 TABLET(500 MG) BY MOUTH TWICE DAILY   torsemide (DEMADEX) 20 MG tablet Take one tablet by mouth every third day.   Ubrogepant (UBRELVY) 100 MG TABS Take 100 mg by mouth if needed at onset of migraine. May repeat in 2 hours if headache returns or persists. Max 200 mg per 24 hours.   VENTOLIN HFA 108 (90 Base) MCG/ACT inhaler INHALE 2 PUFFS INTO THE LUNGS EVERY 6 HOURS AS NEEDED FOR WHEEZING OR SHORTNESS OF BREATH   No facility-administered encounter medications on file as of 05/03/2023.    Allergies (verified) Bactrim [sulfamethoxazole-trimethoprim], Cefuroxime axetil, Oxycodone, Pravastatin, Terfenadine, Zocor [simvastatin], Tramadol, Atorvastatin, Cefuroxime, Lactose intolerance (gi), Latex, Lime flavor [flavoring agent (non-screening)], Metformin and related, Oxycodone hcl, Pravastatin sodium, Rosuvastatin, Rosuvastatin calcium, Tape, and Wound dressing adhesive   History: Past Medical History:  Diagnosis Date   A-fib (HCC)    Abnormal CT of the chest 2008   last CT4-l 2009:  . No f/u suggested    Allergy    Asthma    CAD (coronary artery disease)    a. Coronary CTA 10/16: Coronary Ca score 211, mod non-obstructive CAD with LM mild plaque (25-50%), mid LAD 50-69%. b. Neg nuc 06/2015.   Cataract    BILATERAL-REMOVED   Chronic diastolic CHF (congestive heart failure) (HCC)    Collagen vascular disease (HCC)    "arterial sclerosis" per pt   Complication of anesthesia    trouble waking up   Fibromyalgia    GERD (gastroesophageal reflux disease)    H/O hiatal hernia    Hearing loss    left ear   Heart murmur    Hyperlipidemia    Hypertension    Kidney disease 04/04/2022   Malignant neoplasm of ascending colon (HCC) 2016    Minimally invasive right hemicolectomy to be done    Neuromuscular disorder (HCC)    FIBROMYALGIA   Ocular migraine    OSA (obstructive sleep apnea) 09/2007   dx w/ a sleep study, not on  CPAP   Osteoarthritis    Osteoporosis    Pneumonia    "double" in 2004   PONV (postoperative nausea and vomiting)    Reactive airway disease 01/29/2002   dx of pseudoasthma / vcd in 2005 and nl sprirometry History of dyspnea, 2011,  improved after several medications were changed around Question of COPD, disproved July 06, 2009 with nl pft's       Rheumatoid factor positive    Shingles 11/2009   Sleep apnea    Stroke (HCC)    TIA (transient ischemic attack)    x2 - on Plavix for this   Torn rotator cuff    right worse than left, both are torn  Tumor, 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 Garnette Scheuermann (done after colon surgery)   CATARACT EXTRACTION W/ INTRAOCULAR LENS  IMPLANT, BILATERAL  2012   COLON SURGERY  10/2014   EYE SURGERY Bilateral    torq lens for cataracts   LEFT HEART CATH AND CORONARY ANGIOGRAPHY N/A 03/22/2016   Procedure: Left Heart Cath and Coronary Angiography;  Surgeon: Lyn Records, MD;  Location: [facility] INVASIVE CV LAB;  Service: Cardiovascular;  Laterality: N/A;   LUMBAR LAMINECTOMY/DECOMPRESSION MICRODISCECTOMY N/A 01/20/2018   Procedure: L4-5 decompression;  Surgeon: Eldred Manges, MD;  Location: [facility] OR;  Service: Orthopedics;  Laterality: N/A;   THYROIDECTOMY, PARTIAL  1960's   TOTAL KNEE ARTHROPLASTY Right 12/04/2019   Procedure: RIGHT TOTAL KNEE ARTHROPLASTY;  Surgeon: Eldred Manges, MD;   Location: [facility] OR;  Service: Orthopedics;  Laterality: Right;  RNFA APRIL PLEASE   VAGINAL MASS EXCISION  1994   "Laser surgery for vaginal cancer; followed by chemotherapy" (06/27/2012)   Family History  Problem Relation Age of Onset   Heart disease Father    Heart disease Mother    Lung cancer Mother    Allergies Sister    Parkinsonism Sister        possible   Asthma Sister    Asthma Paternal Grandmother    Stroke Paternal Grandmother    Heart disease Other        paternal grandparents, maternal grandparents,    Heart disease Brother    Emphysema Brother    Aneurysm Brother        x3   Kidney failure Brother    Diabetes Brother    Diabetes Brother    Stroke Brother    Stroke Maternal Grandmother    Breast cancer Neg Hx    Colon cancer Neg Hx    Heart attack Neg Hx    Esophageal cancer Neg Hx    Rectal cancer Neg Hx    Stomach cancer Neg Hx    Social History   Socioeconomic History   Marital status: Widowed    Spouse name: Adela Glimpse   Number of children: 2   Years of education: masters   Highest education level: Not on file  Occupational History   Occupation: Retired, disable since 2000    Employer: RETIRED  Tobacco Use   Smoking status: Former    Current packs/day: 0.00    Average packs/day: 0.3 packs/day for 5.0 years (1.3 ttl pk-yrs)    Types: Cigarettes    Start date: 01/29/1993    Quit date: 01/29/1998    Years since quitting: 25.2    Passive exposure: Past   Smokeless tobacco: Never   Tobacco comments:    Quit in 2001  Vaping Use   Vaping status: Never Used  Substance and Sexual Activity   Alcohol use: No    Alcohol/week: 0.0 standard drinks of alcohol   Drug use: No   Sexual activity: Never  Other Topics Concern   Not on file  Social History Narrative  On disability since 2000--- also husband has MS   Education. College.   Right handed.      02/15/2022   Patient's daughter lives with her   Social Drivers of Corporate investment banker Strain:  Low Risk  (05/03/2023)   Overall Financial Resource Strain (CARDIA)    Difficulty of Paying Living Expenses: Not hard at all  Food Insecurity: No Food Insecurity (05/03/2023)   Hunger Vital Sign    Worried About Running Out of Food in the Last Year: Never true    Ran Out of Food in the Last Year: Never true  Transportation Needs: No Transportation Needs (05/03/2023)   PRAPARE - Administrator, Civil Service (Medical): No    Lack of Transportation (Non-Medical): No  Physical Activity: Sufficiently Active (05/03/2023)   Exercise Vital Sign    Days of Exercise per Week: 5 days    Minutes of Exercise per Session: 30 min  Recent Concern: Physical Activity - Insufficiently Active (05/03/2023)   Exercise Vital Sign    Days of Exercise per Week: 3 days    Minutes of Exercise per Session: 20 min  Stress: No Stress Concern Present (05/03/2023)   [employer] of Occupational Health - Occupational Stress Questionnaire    Feeling of Stress : Not at all  Social Connections: Moderately Isolated (05/03/2023)   Social Connection and Isolation Panel [NHANES]    Frequency of Communication with Friends and Family: More than three times a week    Frequency of Social Gatherings with Friends and Family: Never    Attends Religious Services: 1 to 4 times per year    Active Member of [employer] or Organizations: No    Attends Banker Meetings: Never    Marital Status: Widowed    Tobacco Counseling Counseling given: No Tobacco comments: Quit in 2001    Clinical Intake:  Pre-visit preparation completed: Yes  Pain : 0-10 Pain Score: 7  Pain Type: Acute pain Pain Location: Neck Pain Radiating Towards: not radiating Pain Descriptors / Indicators: Aching Pain Onset: 1 to 4 weeks ago Pain Frequency: Rarely (occurred during the dentist visit due to holding head/neck a certain way for too long) Pain Relieving Factors: Tylenol Effect of Pain on Daily Activities: position of head/neck  Pain  Relieving Factors: Tylenol  BMI - recorded: 35.67 Nutritional Status: BMI > 30  Obese Nutritional Risks: None Diabetes: Yes CBG done?: No Did pt. bring in CBG monitor from home?: No  Lab Results  Component Value Date   HGBA1C 6.3 03/04/2023   HGBA1C 6.5 08/28/2022   HGBA1C 6.4 02/27/2022     How often do you need to have someone help you when you read instructions, pamphlets, or other written materials from your doctor or pharmacy?: 1 - Never  Interpreter Needed?: No  Information entered by :: Hassell Halim, CMA   Activities of Daily Living     05/03/2023    1:13 PM  In your present state of health, do you have any difficulty performing the following activities:  Hearing? 0  Vision? 0  Difficulty concentrating or making decisions? 0  Walking or climbing stairs? 0  Dressing or bathing? 0  Doing errands, shopping? 0  Preparing Food and eating ? N  Using the Toilet? N  In the past six months, have you accidently leaked urine? Y  Comment wears depends  Do you have problems with loss of bowel control? N  Managing your Medications? N  Managing your Finances? N  Housekeeping or managing your Housekeeping? N    Patient Care Team: Pincus Sanes, MD as PCP - General (Internal Medicine) Pricilla Riffle, MD as PCP - Cardiology (Cardiology) Jethro Bolus, MD as Consulting Physician (Ophthalmology) Lars Masson, MD as Consulting Physician (Cardiology) Zelphia Cairo, MD as Consulting Physician (Obstetrics and Gynecology) Romie Levee, MD as Consulting Physician (General Surgery) Luciana Axe Alford Highland, MD as Consulting Physician (Ophthalmology) Eldred Manges, MD (Inactive) as Consulting Physician (Orthopedic Surgery) Kennon Rounds as Physician Assistant (Cardiology)  Indicate any recent Medical Services you may have received from other than [facility] providers in the past year (date may be approximate).     Assessment:   This is a routine wellness examination for  Samantha [name].  Hearing/Vision screen Hearing Screening - Comments:: Wears 1 hearing aid (left ear) Vision Screening - Comments:: Wears rx glasses - up to date with routine eye exams with Dr Barbaraann Barthel (Dr Nile Riggs retired) - New Referral to Ophthalmologist [facility])   Goals Addressed               This Visit's Progress     Patient Stated (pt-stated)        Patient stated she plans to stay active and exercise.        Depression Screen     05/03/2023    1:19 PM 02/27/2023   11:32 AM 08/28/2022   10:57 AM 04/23/2022    2:11 PM 04/04/2022    4:10 PM 12/19/2021   11:26 AM 10/09/2021    9:21 AM  PHQ 2/9 Scores  PHQ - 2 Score 0 0 0 0 0 0 0  PHQ- 9 Score 1  0    0    Fall Risk     05/03/2023    1:16 PM 02/27/2023   11:31 AM 08/28/2022   10:57 AM 04/23/2022    2:10 PM 04/04/2022    4:10 PM  Fall Risk   Falls in the past year? 0 0 0 0 0  Number falls in past yr: 0 0 0 0 0  Injury with Fall? 0 0 0 0 0  Risk for fall due to : No Fall Risks;Impaired balance/gait;History of fall(s) No Fall Risks No Fall Risks No Fall Risks No Fall Risks  Follow up Falls prevention discussed;Falls evaluation completed Falls evaluation completed Falls evaluation completed Falls evaluation completed Falls evaluation completed    MEDICARE RISK AT HOME:  Medicare Risk at Home Any stairs in or around the home?: Yes (outside) If so, are there any without handrails?: Yes Home free of loose throw rugs in walkways, pet beds, electrical cords, etc?: Yes Adequate lighting in your home to reduce risk of falls?: Yes Life alert?: No Use of a cane, walker or w/c?: Yes (cane/walker) Grab bars in the bathroom?: Yes Shower chair or bench in shower?: Yes Elevated toilet seat or a handicapped toilet?: Yes  TIMED UP AND GO:  Was the test performed?  No  Cognitive Function: 6CIT completed    08/13/2017    3:20 PM 10/19/2016    1:11 PM  MMSE - Mini Mental State Exam  Orientation to time 5 5  Orientation to Place 5  5  Registration 3 3  Attention/ Calculation 5 5  Recall 1 2  Language- name 2 objects 2 2  Language- repeat 1 1  Language- follow 3 step command 3 3  Language- read & follow direction 1 1  Write a sentence 1 1  Copy design 1  1  Total score 28 29        05/03/2023    1:19 PM 12/19/2021   11:26 AM  6CIT Screen  What Year? 0 points 0 points  What month? 0 points 0 points  What time? 0 points 0 points  Count back from 20 0 points 0 points  Months in reverse 0 points 0 points  Repeat phrase 0 points 0 points  Total Score 0 points 0 points    Immunizations Immunization History  Administered Date(s) Administered   Fluad Quad(high Dose 65+) 11/05/2018, 11/04/2019   Fluad Trivalent(High Dose 65+) 02/27/2023   Influenza Split 11/30/2010   Influenza Whole 10/20/2008, 01/02/2010   Influenza, High Dose Seasonal PF 12/24/2012, 03/17/2015, 10/19/2015, 10/19/2016, 10/23/2017, 11/15/2021   Influenza, Seasonal, Injecte, Preservative Fre 01/01/2012   Influenza,inj,Quad PF,6+ Mos 11/13/2013   Influenza-Unspecified 11/08/2020   PFIZER(Purple Top)SARS-COV-2 Vaccination 03/24/2019, 04/14/2019   Pneumococcal Conjugate-13 05/19/2014   Pneumococcal Polysaccharide-23 01/29/2002, 11/23/2008   Respiratory Syncytial Virus Vaccine,Recomb Aduvanted(Arexvy) 07/19/2022   Td 01/29/2001   Tdap 08/14/2011   Zoster Recombinant(Shingrix) 03/02/2022, 07/19/2022   Zoster, Live 12/24/2012    Screening Tests Health Maintenance  Topic Date Due   COVID-19 Vaccine (3 - Pfizer risk series) 05/12/2019   DTaP/Tdap/Td (3 - Td or Tdap) 08/13/2021   OPHTHALMOLOGY EXAM  07/19/2023   INFLUENZA VACCINE  08/30/2023   HEMOGLOBIN A1C  09/01/2023   FOOT EXAM  12/19/2023   Diabetic kidney evaluation - eGFR measurement  03/03/2024   Diabetic kidney evaluation - Urine ACR  03/03/2024   Colonoscopy  04/19/2024   Medicare Annual Wellness (AWV)  05/02/2024   DEXA SCAN  08/15/2027   Pneumonia Vaccine 66+ Years old   Completed   Zoster Vaccines- Shingrix  Completed   HPV VACCINES  Aged Out   Hepatitis C Screening  Discontinued    Health Maintenance  Health Maintenance Due  Topic Date Due   COVID-19 Vaccine (3 - Pfizer risk series) 05/12/2019   DTaP/Tdap/Td (3 - Td or Tdap) 08/13/2021   Health Maintenance Items Addressed: 05/03/2023 Referral sent to Optometry/Ophthalmology - [facility]  Additional Screening:  Vision Screening: Recommended annual ophthalmology exams for early detection of glaucoma and other disorders of the eye.  Dental Screening: Recommended annual dental exams for proper oral hygiene  Community Resource Referral / Chronic Care Management: CRR required this visit?  No   CCM required this visit?  No     Plan:     I have personally reviewed and noted the following in the patient's chart:   Medical and social history Use of alcohol, tobacco or illicit drugs  Current medications and supplements including opioid prescriptions. Patient is not currently taking opioid prescriptions. Functional ability and status Nutritional status Physical activity Advanced directives List of other physicians Hospitalizations, surgeries, and ER visits in previous 12 months Vitals Screenings to include cognitive, depression, and falls Referrals and appointments  In addition, I have reviewed and discussed with patient certain preventive protocols, quality metrics, and best practice recommendations. A written personalized care plan for preventive services as well as general preventive health recommendations were provided to patient.     Darreld Mclean, CMA   05/03/2023   After Visit Summary: (MyChart) Due to this being a telephonic visit, the after visit summary with patients personalized plan was offered to patient via MyChart   Notes: Nothing significant to report at this time.

## 2023-05-03 NOTE — Patient Instructions (Addendum)
 Samantha [name] , Thank you for taking time to come for your Medicare Wellness Visit. I appreciate your ongoing commitment to your health goals. Please review the following plan we discussed and let me know if I can assist you in the future.   Referrals/Orders/Follow-Ups/Clinician Recommendations: Aim for 30 minutes of exercise or brisk walking, 6-8 glasses of water, and 5 servings of fruits and vegetables each day. Referral to [facility] for a routine eye exam.  This is a list of the screening recommended for you and due dates:  Health Maintenance  Topic Date Due   COVID-19 Vaccine (3 - Pfizer risk series) 05/12/2019   DTaP/Tdap/Td vaccine (3 - Td or Tdap) 08/13/2021   Eye exam for diabetics  07/19/2023   Flu Shot  08/30/2023   Hemoglobin A1C  09/01/2023   Complete foot exam   12/19/2023   Yearly kidney function blood test for diabetes  03/03/2024   Yearly kidney health urinalysis for diabetes  03/03/2024   Colon Cancer Screening  04/19/2024   Medicare Annual Wellness Visit  05/02/2024   DEXA scan (bone density measurement)  08/15/2027   Pneumonia Vaccine  Completed   Zoster (Shingles) Vaccine  Completed   HPV Vaccine  Aged Out   Hepatitis C Screening  Discontinued    Advanced directives: (Copy Requested) Please bring a copy of your health care power of attorney and living will to the office to be added to your chart at your convenience. You can mail to [facility] [address] [place], [place] [address] or email to [email or web address]  Next Medicare Annual Wellness Visit scheduled for next year: Yes

## 2023-05-09 ENCOUNTER — Ambulatory Visit: Payer: Medicare Other | Admitting: Neurology

## 2023-05-09 ENCOUNTER — Telehealth: Payer: Self-pay

## 2023-05-09 ENCOUNTER — Encounter: Payer: Self-pay | Admitting: Neurology

## 2023-05-09 VITALS — BP 124/74 | HR 68 | Ht 62.0 in | Wt 207.0 lb

## 2023-05-09 DIAGNOSIS — G43709 Chronic migraine without aura, not intractable, without status migrainosus: Secondary | ICD-10-CM

## 2023-05-09 DIAGNOSIS — R519 Headache, unspecified: Secondary | ICD-10-CM | POA: Diagnosis not present

## 2023-05-09 DIAGNOSIS — M542 Cervicalgia: Secondary | ICD-10-CM | POA: Diagnosis not present

## 2023-05-09 MED ORDER — ALPRAZOLAM 0.5 MG PO TABS
0.5000 mg | ORAL_TABLET | Freq: Two times a day (BID) | ORAL | 0 refills | Status: AC | PRN
Start: 2023-05-09 — End: ?

## 2023-05-09 MED ORDER — DULOXETINE HCL 60 MG PO CPEP: 60.0000 mg | ORAL_CAPSULE | Freq: Every day | ORAL | 3 refills | Status: AC

## 2023-05-09 MED ORDER — NURTEC 75 MG PO TBDP: ORAL_TABLET | ORAL | 11 refills | Status: AC

## 2023-05-09 NOTE — Telephone Encounter (Signed)
 Needs Botox auth. New start.   Chronic Migraine CPT 64615  95814/EMG GUIDANCE    Botox [id number] Units:200   Chronic migraine w/o aura w/o status migrainosus, not intractable G43.709

## 2023-05-09 NOTE — Progress Notes (Signed)
 Chief Complaint  Patient presents with   Follow-up    Pt in 14, here alone Pt in for balance issues, dizziness and migraines. Pt states when walking she seems to lean on towards the left side. States she is having 3-4 migraines a month. States she has a feeling of being off balance rather than dizzy.       ASSESSMENT AND PLAN  Samantha [name] is a 83 y.o. female   Chronic migraine Also component of cervicogenic pain due to her severe cervical degenerative disease, rheumatoid arthritis, Her headache responded to CGRP antagonist, Nurtec works better than Bernita Raisin, which takes longer to take effect and incomplete relief of headache She has history of cervical decompression surgery, MRI in February 2024 showed ACDF from C3-C6, prominent osteophyte protrusion at C6-7, with mild canal severe right and moderate left foraminal narrowing, Significant tenderness around left nuchal line  Will try Botox injections migraine prevention Higher dose of Cymbalta 60 mg daily Suggest warm compression, massage Xanax low-dose as needed during the dental procedure  DIAGNOSTIC DATA (LABS, IMAGING, TESTING) - I reviewed patient records, labs, notes, testing and imaging myself where available.   MEDICAL HISTORY:   Samantha [name] is a 83 year old female, seen in request by orthopedic surgeon Annell Greening C, for evaluation of severe neck pain, radiating pain to occipital region, frequent headaches, her primary care physician is Dr. Lawerance Bach, [facility]   I reviewed and summarized the referring note.PMHX RA, TIA DM Chronic kidney disease. HTN Cervical decompression surgery in 2000, prior to surgery, she has significant right cervical radiculopathy   She did have a history of cervical decompression surgery in 2000, prior to the surgery, she presented with right cervical radiculopathy, surgery did help her symptoms significantly  Since January 2023, she began to noticed significant headaches, no clear  trigger noted, she now complains of neck pain, radiating pain to left occipital region, then settled behind her left eye, worsening bilateral ear tinnitus, her left side neck pain headache happening on a daily basis,  She also seems to have worsening left-sided difficulty, left leg gave out underneath her, dragging left toes, rely on a walker now, also had worsening urinary urgency involving bowel and bladder,   I personally reviewed x-ray cervical region December 2022, 3 level cervical fusion C3-4, adjacent C6-7 disc space narrowing and spondylosis   She also have significant low back pain, is under the care of of Dr. Ophelia Charter, I personally reviewed MRI of lumbar November 2019, severe canal stenosis L4-5, multilevel degenerative changes, neuroforaminal narrowing at all levels, multilevel extraforaminal disc protrusion that may affect to the accelerated L T12, right L2, right L3 nerve roots  She also complains of significant stress, she lives with her daughter, who suffered endometriosis cancer  UPDATE May 09 2023: She can continue to have frequent headaches at least once a week, previous Nurtec works much better than Bernita Raisin she is currently taking, which take longer to take effect  She has severe neck pain especially at the left nuchal region triggered by hyperextension, she went to the dentist couple weeks ago, complains such severe neck pain, trigger body shaking, lasting for few hours,  Cymbalta 30 mg helps her some, tolerating it well  MRI cervical spine in February 2024 showed stable change of ACDF C3-C6, with prominent disc osteophyte protrusion at C6-7, with mild canal severe right and moderate left foraminal narrowing, C2-3 also showed mild right foraminal narrowing   PHYSICAL EXAM:   Vitals:   05/09/23 1131  BP: 124/74  Pulse: 68  Weight: 207 lb (93.9 kg)  Height: 5\' 2"  (1.575 m)   Not recorded     Body mass index is 37.86 kg/m.  PHYSICAL EXAMNIATION:  Gen: NAD,  conversant, well nourised, well groomed                     Cardiovascular: Regular rate rhythm, no peripheral edema, warm, nontender. Eyes: Conjunctivae clear without exudates or hemorrhage Neck: Supple, no carotid bruits.  Significant tenderness at the left nuchal line Pulmonary: Clear to auscultation bilaterally   NEUROLOGICAL EXAM:  MENTAL STATUS: Speech/cognition: Awake, alert, oriented to history taking and casual conversation CRANIAL NERVES: CN II: Visual fields are full to confrontation. Pupils are round equal and briskly reactive to light. CN III, IV, VI: extraocular movement are normal. No ptosis. CN V: Facial sensation is intact to light touch CN VII: Face is symmetric with normal eye closure  CN VIII: Hearing is normal to causal conversation. CN IX, X: Phonation is normal. CN XI: Head turning and shoulder shrug are intact  MOTOR: There is no pronator drift of out-stretched arms. Muscle bulk and tone are normal. Muscle strength is normal.  REFLEXES: Reflexes are 1 and symmetric at the biceps, triceps, knees, and ankles. Plantar responses are flexor.  SENSORY: Intact to light touch, pinprick and vibratory sensation are intact in fingers and toes.  COORDINATION: There is no trunk or limb dysmetria noted.  GAIT/STANCE: Push-up to get up from seated position, cautious, mildly antalgic  REVIEW OF SYSTEMS:  Full 14 system review of systems performed and notable only for as above All other review of systems were negative.   ALLERGIES: Allergies  Allergen Reactions   Bactrim [Sulfamethoxazole-Trimethoprim] Other (See Comments)    See OV 09-15-13, rash-tongue swelling due to bactrim ?   Cefuroxime Axetil Hives   Oxycodone Nausea And Vomiting   Pravastatin Other (See Comments)    "muscle breakdown" with profuse sweating   Terfenadine Hives and Other (See Comments)    Other reaction(s): Other (See Comments)  Other reaction(s): Not available   Zocor [Simvastatin]  Other (See Comments)    2012 "Muscle breakdown " with profuse sweating   Tramadol Nausea And Vomiting   Atorvastatin Other (See Comments)    Pt reports muscle aches and joint pains   Cefuroxime Other (See Comments)   Lactose Intolerance (Gi) Diarrhea   Latex     blisters   Lime Flavor [Flavoring Agent (Non-Screening)] Diarrhea   Metformin And Related Diarrhea   Oxycodone Hcl Other (See Comments)   Pravastatin Sodium Other (See Comments)   Rosuvastatin Other (See Comments)    Pt reports "myalgias, fatigue, nosebleeds."   Rosuvastatin Calcium Other (See Comments)   Tape Other (See Comments)    blisters   Wound Dressing Adhesive Other (See Comments)    blisters    HOME MEDICATIONS: Current Outpatient Medications  Medication Sig Dispense Refill   acetaminophen (TYLENOL) 500 MG tablet Take 500 mg by mouth every 6 (six) hours as needed for mild pain.      albuterol (PROVENTIL) (2.5 MG/3ML) 0.083% nebulizer solution Take 3 mLs (2.5 mg total) by nebulization every 6 (six) hours as needed for wheezing or shortness of breath. 150 mL 0   budesonide-formoterol (SYMBICORT) 160-4.5 MCG/ACT inhaler Inhale 2 puffs into the lungs 2 (two) times daily. 1 each 3   Calcium-Magnesium-Zinc (CAL-MAG-ZINC PO) Take 1 tablet by mouth daily.     cetirizine (ZYRTEC) 10 MG tablet TAKE 1  TABLET(10 MG) BY MOUTH DAILY 90 tablet 3   cholecalciferol (VITAMIN D) 1000 UNITS tablet Take 1,000 Units by mouth every morning.      clopidogrel (PLAVIX) 75 MG tablet TAKE 1 TABLET(75 MG) BY MOUTH DAILY 90 tablet 1   dapagliflozin propanediol (FARXIGA) 10 MG TABS tablet TAKE 1 TABLET(10 MG) BY MOUTH DAILY BEFORE BREAKFAST 30 tablet 2   DULoxetine (CYMBALTA) 30 MG capsule TAKE 1 CAPSULE(30 MG) BY MOUTH DAILY 90 capsule 3   Evolocumab (REPATHA SURECLICK) 140 MG/ML SOAJ INJECT 1 PEN UNDER THE SKIN EVERY 14 DAYS 6 mL 3   famotidine (PEPCID) 40 MG tablet TAKE 1 TABLET(40 MG) BY MOUTH DAILY 90 tablet 1   glucose blood test strip  Use as instructed 100 each 12   hydroxychloroquine (PLAQUENIL) 200 MG tablet Take 400 mg by mouth daily.      isosorbide mononitrate (IMDUR) 30 MG 24 hr tablet Take 3 tablets (90 mg total) by mouth daily. 90 tablet 6   Lancets (ONETOUCH ULTRASOFT) lancets Use as instructed 100 each 12   losartan (COZAAR) 25 MG tablet TAKE 1 TABLET(25 MG) BY MOUTH DAILY 90 tablet 3   nebivolol (BYSTOLIC) 10 MG tablet TAKE 1 WGNFAO(13 MG) BY MOUTH DAILY 90 tablet 2   nitroGLYCERIN (NITROSTAT) 0.4 MG SL tablet Place 1 tablet (0.4 mg total) under the tongue every 5 (five) minutes as needed for chest pain. 25 tablet 11   Polyethyl Glycol-Propyl Glycol (SYSTANE) 0.4-0.3 % GEL ophthalmic gel Place 1 application into both eyes daily as needed (for dry eyes).      pregabalin (LYRICA) 75 MG capsule TAKE 1 CAPSULE(75 MG) BY MOUTH THREE TIMES DAILY 90 capsule 1   psyllium (METAMUCIL) 58.6 % powder Take 1 packet by mouth daily.     ranolazine (RANEXA) 500 MG 12 hr tablet TAKE 1 TABLET(500 MG) BY MOUTH TWICE DAILY 180 tablet 3   torsemide (DEMADEX) 20 MG tablet Take one tablet by mouth every third day. 24 tablet 3   Ubrogepant (UBRELVY) 100 MG TABS Take 100 mg by mouth if needed at onset of migraine. May repeat in 2 hours if headache returns or persists. Max 200 mg per 24 hours. 16 tablet 3   VENTOLIN HFA 108 (90 Base) MCG/ACT inhaler INHALE 2 PUFFS INTO THE LUNGS EVERY 6 HOURS AS NEEDED FOR WHEEZING OR SHORTNESS OF BREATH 18 g 11   No current facility-administered medications for this visit.    PAST MEDICAL HISTORY: Past Medical History:  Diagnosis Date   A-fib [facility])    Abnormal CT of the chest 2008   last CT4-l 2009:  . No f/u suggested    Allergy    Asthma    CAD (coronary artery disease)    a. Coronary CTA 10/16: Coronary Ca score 211, mod non-obstructive CAD with LM mild plaque (25-50%), mid LAD 50-69%. b. Neg nuc 06/2015.   Cataract    BILATERAL-REMOVED   Chronic diastolic CHF (congestive heart failure) (HCC)     Collagen vascular disease (HCC)    "arterial sclerosis" per pt   Complication of anesthesia    trouble waking up   Fibromyalgia    GERD (gastroesophageal reflux disease)    H/O hiatal hernia    Hearing loss    left ear   Heart murmur    Hyperlipidemia    Hypertension    Kidney disease 04/04/2022   Malignant neoplasm of ascending colon (HCC) 2016   Minimally invasive right hemicolectomy to be done  Neuromuscular disorder (HCC)    FIBROMYALGIA   Ocular migraine    OSA (obstructive sleep apnea) 09/2007   dx w/ a sleep study, not on  CPAP   Osteoarthritis    Osteoporosis    Pneumonia    "double" in 2004   PONV (postoperative nausea and vomiting)    Reactive airway disease 01/29/2002   dx of pseudoasthma / vcd in 2005 and nl sprirometry History of dyspnea, 2011,  improved after several medications were changed around Question of COPD, disproved July 06, 2009 with nl pft's       Rheumatoid factor positive    Shingles 11/2009   Sleep apnea    Stroke (HCC)    TIA (transient ischemic attack)    x2 - on Plavix for this   Torn rotator cuff    right worse than left, both are torn   Tumor, 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 Garnette Scheuermann (done after colon surgery)   CATARACT EXTRACTION W/ INTRAOCULAR LENS  IMPLANT, BILATERAL  2012   COLON SURGERY  10/2014   EYE SURGERY Bilateral    torq lens for cataracts   LEFT HEART CATH AND CORONARY ANGIOGRAPHY N/A 03/22/2016   Procedure: Left Heart Cath and Coronary Angiography;  Surgeon: Lyn Records, MD;  [facility] INVASIVE CV  LAB;  Service: Cardiovascular;  Laterality: N/A;   LUMBAR LAMINECTOMY/DECOMPRESSION MICRODISCECTOMY N/A 01/20/2018   Procedure: L4-5 decompression;  Surgeon: Eldred Manges, MD;  Location: [facility] OR;  Service: Orthopedics;  Laterality: N/A;   THYROIDECTOMY, PARTIAL  1960's   TOTAL KNEE ARTHROPLASTY Right 12/04/2019   Procedure: RIGHT TOTAL KNEE ARTHROPLASTY;  Surgeon: Eldred Manges, MD;  Location: [facility] OR;  Service: Orthopedics;  Laterality: Right;  RNFA APRIL PLEASE   VAGINAL MASS EXCISION  1994   "Laser surgery for vaginal cancer; followed by chemotherapy" (06/27/2012)    FAMILY HISTORY: Family History  Problem Relation Age of Onset   Heart disease Father    Heart disease Mother    Lung cancer Mother    Allergies Sister    Parkinsonism Sister        possible   Asthma Sister    Asthma Paternal Grandmother    Stroke Paternal Grandmother    Heart disease Other        paternal grandparents, maternal grandparents,    Heart disease Brother    Emphysema Brother    Aneurysm Brother        x3   Kidney failure Brother    Diabetes Brother    Diabetes Brother    Stroke Brother    Stroke Maternal Grandmother    Breast cancer Neg Hx    Colon cancer Neg Hx    Heart attack Neg Hx    Esophageal cancer Neg Hx    Rectal cancer Neg Hx    Stomach cancer Neg Hx     SOCIAL HISTORY: Social History   Socioeconomic History   Marital status: Widowed  Spouse name: Adela Glimpse   Number of children: 2   Years of education: masters   Highest education level: Not on file  Occupational History   Occupation: Retired, disable since 2000    Employer: RETIRED  Tobacco Use   Smoking status: Former    Current packs/day: 0.00    Average packs/day: 0.3 packs/day for 5.0 years (1.3 ttl pk-yrs)    Types: Cigarettes    Start date: 01/29/1993    Quit date: 01/29/1998    Years since quitting: 25.2    Passive exposure: Past   Smokeless tobacco: Never   Tobacco comments:    Quit in 2001  Vaping Use   Vaping  status: Never Used  Substance and Sexual Activity   Alcohol use: No    Alcohol/week: 0.0 standard drinks of alcohol   Drug use: No   Sexual activity: Never  Other Topics Concern   Not on file  Social History Narrative   On disability since 2000--- also husband has MS   Education. College.   Right handed.      02/15/2022   Patient's daughter lives with her   Social Drivers of Corporate investment banker Strain: Low Risk  (05/03/2023)   Overall Financial Resource Strain (CARDIA)    Difficulty of Paying Living Expenses: Not hard at all  Food Insecurity: No Food Insecurity (05/03/2023)   Hunger Vital Sign    Worried About Running Out of Food in the Last Year: Never true    Ran Out of Food in the Last Year: Never true  Transportation Needs: No Transportation Needs (05/03/2023)   PRAPARE - Administrator, Civil Service (Medical): No    Lack of Transportation (Non-Medical): No  Physical Activity: Sufficiently Active (05/03/2023)   Exercise Vital Sign    Days of Exercise per Week: 5 days    Minutes of Exercise per Session: 30 min  Recent Concern: Physical Activity - Insufficiently Active (05/03/2023)   Exercise Vital Sign    Days of Exercise per Week: 3 days    Minutes of Exercise per Session: 20 min  Stress: No Stress Concern Present (05/03/2023)   [employer] of Occupational Health - Occupational Stress Questionnaire    Feeling of Stress : Not at all  Social Connections: Moderately Isolated (05/03/2023)   Social Connection and Isolation Panel [NHANES]    Frequency of Communication with Friends and Family: More than three times a week    Frequency of Social Gatherings with Friends and Family: Never    Attends Religious Services: 1 to 4 times per year    Active Member of [employer] or Organizations: No    Attends Banker Meetings: Never    Marital Status: Widowed  Intimate Partner Violence: Not At Risk (05/03/2023)   Humiliation, Afraid, Rape, and Kick questionnaire     Fear of Current or Ex-Partner: No    Emotionally Abused: No    Physically Abused: No    Sexually Abused: No      Levert Feinstein, M.D. Ph.D.  [facility] [address], [address] [place], [place] [address] Ph: [phone] Fax: [phone]  CC:  Pincus Sanes, MD [address] [place],  [place] [address]  Pincus Sanes, MD

## 2023-05-13 NOTE — Telephone Encounter (Signed)
 Medicare and AARP do not require prior auth. Tricare doesn't require prior auth when used as the secondary insurance. Pt will be B/B and is already scheduled with Dr. Gracie Lav for 06/05/23.

## 2023-05-17 ENCOUNTER — Other Ambulatory Visit (HOSPITAL_COMMUNITY): Payer: Self-pay

## 2023-05-17 ENCOUNTER — Telehealth: Payer: Self-pay

## 2023-05-17 NOTE — Telephone Encounter (Signed)
 Pharmacy Patient Advocate Encounter  Received notification from [employer] that Prior Authorization for Nurtec has been APPROVED from 05/17/2023 to 01/29/2024. Ran test claim, Copay is $0. This test claim was processed through [facility]- copay amounts may vary at other pharmacies due to pharmacy/plan contracts, or as the patient moves through the different stages of their insurance plan.   PA #/Case ID/Reference #: PA Case ID #: [id number]

## 2023-05-17 NOTE — Telephone Encounter (Signed)
 Pharmacy Patient Advocate Encounter   Received notification from Fax that prior authorization for Nurtec 75MG  dispersible tablets is required/requested.   Insurance verification completed.   The patient is insured through [place] .   Per test claim: PA required; PA submitted to above mentioned insurance via CoverMyMeds Key/confirmation #/EOC [facility] Status is pending

## 2023-05-22 ENCOUNTER — Other Ambulatory Visit: Payer: Self-pay | Admitting: Internal Medicine

## 2023-06-05 ENCOUNTER — Ambulatory Visit (INDEPENDENT_AMBULATORY_CARE_PROVIDER_SITE_OTHER): Admitting: Neurology

## 2023-06-05 VITALS — BP 121/76 | HR 68

## 2023-06-05 DIAGNOSIS — G43709 Chronic migraine without aura, not intractable, without status migrainosus: Secondary | ICD-10-CM

## 2023-06-05 DIAGNOSIS — R519 Headache, unspecified: Secondary | ICD-10-CM

## 2023-06-05 MED ORDER — ONABOTULINUMTOXINA 200 UNITS IJ SOLR: 200.0000 [IU] | Freq: Once | INTRAMUSCULAR | Status: AC

## 2023-06-05 NOTE — Progress Notes (Addendum)
 Chief Complaint  Patient presents with   Follow-up    Pt in room 14. Alone. Here for botox injection.      ASSESSMENT AND PLAN  Samantha [name] is a 83 y.o. female   Chronic migraine Also component of cervicogenic pain due to her severe cervical degenerative disease, rheumatoid arthritis, Her headache responded to CGRP antagonist, Nurtec works better than Ubrelvy ,  approved by insurance. She has history of cervical decompression surgery, MRI in February 2024 showed ACDF from C3-C6, prominent osteophyte protrusion at C6-7, with mild canal severe right and moderate left foraminal narrowing, Significant tenderness around left nuchal line  Higher dose of Cymbalta  60 mg daily was helpful. Suggest warm compression, massage.  Botox injection for chronic migraine prevention, injection was performed according to [place] protocol,  5 units of Botox was injected into each side, for 31 injection sites, total of 155 units  Bilateral frontalis 4 injection sites Bilateral corrugate 2 injection sites Procerus 1 injection sites. Bilateral temporalis 8 injection sites Bilateral occipitalis 6 injection sites Bilateral cervical paraspinals 4 injection sites Bilateral upper trapezius 6 injection sites  Extra 45 unites were injected into bilateral levator scapular and upper cervical paraspinals.  DIAGNOSTIC DATA (LABS, IMAGING, TESTING) - I reviewed patient records, labs, notes, testing and imaging myself where available. Addendum: [employer] July 12, 2023, Nurtec is not covered, Preferred options are rizatriptan, Ubrelvy , Qulipta, Emgality, naratriptan, sumatriptan   MEDICAL HISTORY:   Samantha [name] is a 83 year old female, seen in request by orthopedic surgeon Barbarann Anes C, for evaluation of severe neck pain, radiating pain to occipital region, frequent headaches, her primary care physician is Dr. Geofm, Glade   I reviewed and summarized the referring  note.PMHX RA, TIA DM Chronic kidney disease. HTN Cervical decompression surgery in 2000, prior to surgery, she has significant right cervical radiculopathy   She did have a history of cervical decompression surgery in 2000, prior to the surgery, she presented with right cervical radiculopathy, surgery did help her symptoms significantly  Since January 2023, she began to noticed significant headaches, no clear trigger noted, she now complains of neck pain, radiating pain to left occipital region, then settled behind her left eye, worsening bilateral ear tinnitus, her left side neck pain headache happening on a daily basis,  She also seems to have worsening left-sided difficulty, left leg gave out underneath her, dragging left toes, rely on a walker now, also had worsening urinary urgency involving bowel and bladder,   I personally reviewed x-ray cervical region December 2022, 3 level cervical fusion C3-4, adjacent C6-7 disc space narrowing and spondylosis   She also have significant low back pain, is under the care of of Dr. Barbarann, I personally reviewed MRI of lumbar November 2019, severe canal stenosis L4-5, multilevel degenerative changes, neuroforaminal narrowing at all levels, multilevel extraforaminal disc protrusion that may affect to the accelerated L T12, right L2, right L3 nerve roots  She also complains of significant stress, she lives with her daughter, who suffered endometriosis cancer  UPDATE May 09 2023: She can continue to have frequent headaches at least once a week, previous Nurtec works much better than Ubrelvy  she is currently taking, which take longer to take effect  She has severe neck pain especially at the left nuchal region triggered by hyperextension, she went to the dentist couple weeks ago, complains such severe neck pain, trigger body shaking, lasting for few hours,  Cymbalta  30 mg helps her some, tolerating it well  MRI  cervical spine in February 2024 showed  stable change of ACDF C3-C6, with prominent disc osteophyte protrusion at C6-7, with mild canal severe right and moderate left foraminal narrowing, C2-3 also showed mild right foraminal narrowing  UPDATE May 7th 2025: Nurtec was approved. She still complains of frequent headaches, more at occipital regions, neck and shoulder pain.  Cymbalta  higher dose 60mg  was helpful.   PHYSICAL EXAM:   Vitals:   06/05/23 1344  BP: 121/76  Pulse: 68   PHYSICAL EXAMNIATION:  Gen: NAD, conversant, well nourised, well groomed                     Cardiovascular: Regular rate rhythm, no peripheral edema, warm, nontender. Eyes: Conjunctivae clear without exudates or hemorrhage Neck: Supple, no carotid bruits.  Significant tenderness at the left nuchal line Pulmonary: Clear to auscultation bilaterally   NEUROLOGICAL EXAM:  MENTAL STATUS: Speech/cognition: Awake, alert, oriented to history taking and casual conversation CRANIAL NERVES: CN II: Visual fields are full to confrontation. Pupils are round equal and briskly reactive to light. CN III, IV, VI: extraocular movement are normal. No ptosis. CN V: Facial sensation is intact to light touch CN VII: Face is symmetric with normal eye closure  CN VIII: Hearing is normal to causal conversation. CN IX, X: Phonation is normal. CN XI: Head turning and shoulder shrug are intact  MOTOR: There is no pronator drift of out-stretched arms. Muscle bulk and tone are normal. Muscle strength is normal.  REFLEXES: Reflexes are 1 and symmetric at the biceps, triceps, knees, and ankles. Plantar responses are flexor.  SENSORY: Intact to light touch, pinprick and vibratory sensation are intact in fingers and toes.  COORDINATION: There is no trunk or limb dysmetria noted.  GAIT/STANCE: Push-up to get up from seated position, cautious, mildly antalgic  REVIEW OF SYSTEMS:  Full 14 system review of systems performed and notable only for as above All other  review of systems were negative.   ALLERGIES: Allergies  Allergen Reactions   Bactrim  [Sulfamethoxazole -Trimethoprim ] Other (See Comments)    See OV 09-15-13, rash-tongue swelling due to bactrim  ?   Cefuroxime Axetil Hives   Oxycodone  Nausea And Vomiting   Pravastatin  Other (See Comments)    muscle breakdown with profuse sweating   Terfenadine Hives and Other (See Comments)    Other reaction(s): Other (See Comments)  Other reaction(s): Not available   Zocor  [Simvastatin ] Other (See Comments)    2012 Muscle breakdown  with profuse sweating   Tramadol Nausea And Vomiting   Atorvastatin  Other (See Comments)    Pt reports muscle aches and joint pains   Cefuroxime Other (See Comments)   Lactose Intolerance (Gi) Diarrhea   Latex     blisters   Lime Flavor [Flavoring Agent (Non-Screening)] Diarrhea   Metformin  And Related Diarrhea   Oxycodone  Hcl Other (See Comments)   Pravastatin  Sodium Other (See Comments)   Rosuvastatin  Other (See Comments)    Pt reports myalgias, fatigue, nosebleeds.   Rosuvastatin  Calcium  Other (See Comments)   Tape Other (See Comments)    blisters   Wound Dressing Adhesive Other (See Comments)    blisters    HOME MEDICATIONS: Current Outpatient Medications  Medication Sig Dispense Refill   acetaminophen  (TYLENOL ) 500 MG tablet Take 500 mg by mouth every 6 (six) hours as needed for mild pain.      albuterol  (PROVENTIL ) (2.5 MG/3ML) 0.083% nebulizer solution Take 3 mLs (2.5 mg total) by nebulization every 6 (six) hours as needed  for wheezing or shortness of breath. 150 mL 0   ALPRAZolam  (XANAX ) 0.5 MG tablet Take 1 tablet (0.5 mg total) by mouth 2 (two) times daily as needed for anxiety. 30 tablet 0   budesonide -formoterol  (SYMBICORT ) 160-4.5 MCG/ACT inhaler Inhale 2 puffs into the lungs 2 (two) times daily. 1 each 3   Calcium -Magnesium-Zinc (CAL-MAG-ZINC PO) Take 1 tablet by mouth daily.     cetirizine  (ZYRTEC ) 10 MG tablet TAKE 1 TABLET(10 MG) BY  MOUTH DAILY 90 tablet 3   cholecalciferol  (VITAMIN D ) 1000 UNITS tablet Take 1,000 Units by mouth every morning.      clopidogrel  (PLAVIX ) 75 MG tablet TAKE 1 TABLET(75 MG) BY MOUTH DAILY 90 tablet 1   dapagliflozin  propanediol (FARXIGA ) 10 MG TABS tablet TAKE 1 TABLET(10 MG) BY MOUTH DAILY BEFORE BREAKFAST 30 tablet 2   DULoxetine  (CYMBALTA ) 60 MG capsule Take 1 capsule (60 mg total) by mouth daily. 90 capsule 3   Evolocumab  (REPATHA  SURECLICK) 140 MG/ML SOAJ INJECT 1 PEN UNDER THE SKIN EVERY 14 DAYS 6 mL 3   famotidine  (PEPCID ) 40 MG tablet TAKE 1 TABLET(40 MG) BY MOUTH DAILY 90 tablet 1   glucose blood test strip Use as instructed 100 each 12   hydroxychloroquine  (PLAQUENIL ) 200 MG tablet Take 400 mg by mouth daily.      isosorbide  mononitrate (IMDUR ) 30 MG 24 hr tablet Take 3 tablets (90 mg total) by mouth daily. 90 tablet 6   Lancets (ONETOUCH ULTRASOFT) lancets Use as instructed 100 each 12   losartan  (COZAAR ) 25 MG tablet TAKE 1 TABLET(25 MG) BY MOUTH DAILY 90 tablet 3   nebivolol  (BYSTOLIC ) 10 MG tablet TAKE 1 TABLET(10 MG) BY MOUTH DAILY 90 tablet 2   nitroGLYCERIN  (NITROSTAT ) 0.4 MG SL tablet Place 1 tablet (0.4 mg total) under the tongue every 5 (five) minutes as needed for chest pain. 25 tablet 11   Polyethyl Glycol-Propyl Glycol (SYSTANE) 0.4-0.3 % GEL ophthalmic gel Place 1 application into both eyes daily as needed (for dry eyes).      pregabalin  (LYRICA ) 75 MG capsule TAKE 1 CAPSULE(75 MG) BY MOUTH THREE TIMES DAILY 90 capsule 0   psyllium (METAMUCIL) 58.6 % powder Take 1 packet by mouth daily.     ranolazine  (RANEXA ) 500 MG 12 hr tablet TAKE 1 TABLET(500 MG) BY MOUTH TWICE DAILY 180 tablet 3   torsemide  (DEMADEX ) 20 MG tablet Take one tablet by mouth every third day. 24 tablet 3   Ubrogepant  (UBRELVY ) 100 MG TABS Take 100 mg by mouth if needed at onset of migraine. May repeat in 2 hours if headache returns or persists. Max 200 mg per 24 hours. 16 tablet 3   VENTOLIN  HFA 108 (90  Base) MCG/ACT inhaler INHALE 2 PUFFS INTO THE LUNGS EVERY 6 HOURS AS NEEDED FOR WHEEZING OR SHORTNESS OF BREATH 18 g 11   Rimegepant Sulfate (NURTEC) 75 MG TBDP Take 1 tab at onset of migraine.  May repeat in 2 hrs, if needed.  Max dose: 2 tabs/day. This is a 30 day prescription. (Patient not taking: Reported on 06/05/2023) 16 tablet 11   Current Facility-Administered Medications  Medication Dose Route Frequency Provider Last Rate Last Admin   botulinum toxin Type A (BOTOX) injection 200 Units  200 Units Intramuscular Once Onita Duos, MD        PAST MEDICAL HISTORY: Past Medical History:  Diagnosis Date   A-fib [facility])    Abnormal CT of the chest 2008   last CT4-l 2009:  .  No f/u suggested    Allergy    Asthma    CAD (coronary artery disease)    a. Coronary CTA 10/16: Coronary Ca score 211, mod non-obstructive CAD with LM mild plaque (25-50%), mid LAD 50-69%. b. Neg nuc 06/2015.   Cataract    BILATERAL-REMOVED   Chronic diastolic CHF (congestive heart failure) (HCC)    Collagen vascular disease (HCC)    arterial sclerosis per pt   Complication of anesthesia    trouble waking up   Fibromyalgia    GERD (gastroesophageal reflux disease)    H/O hiatal hernia    Hearing loss    left ear   Heart murmur    Hyperlipidemia    Hypertension    Kidney disease 04/04/2022   Malignant neoplasm of ascending colon (HCC) 2016   Minimally invasive right hemicolectomy to be done    Neuromuscular disorder (HCC)    FIBROMYALGIA   Ocular migraine    OSA (obstructive sleep apnea) 09/2007   dx w/ a sleep study, not on  CPAP   Osteoarthritis    Osteoporosis    Pneumonia    double in 2004   PONV (postoperative nausea and vomiting)    Reactive airway disease 01/29/2002   dx of pseudoasthma / vcd in 2005 and nl sprirometry History of dyspnea, 2011,  improved after several medications were changed around Question of COPD, disproved July 06, 2009 with nl pft's       Rheumatoid factor positive     Shingles 11/2009   Sleep apnea    Stroke (HCC)    TIA (transient ischemic attack)    x2 - on Plavix  for this   Torn rotator cuff    right worse than left, both are torn   Tumor, 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 Esmeralda Sharps (done after colon surgery)   CATARACT EXTRACTION W/ INTRAOCULAR LENS  IMPLANT, BILATERAL  2012   COLON SURGERY  10/2014   EYE SURGERY Bilateral    torq lens for cataracts   LEFT HEART CATH AND CORONARY ANGIOGRAPHY N/A 03/22/2016   Procedure: Left Heart Cath and Coronary Angiography;  Surgeon: Victory LELON Sharps, MD;  Location: [facility] INVASIVE CV LAB;  Service: Cardiovascular;  Laterality: N/A;   LUMBAR LAMINECTOMY/DECOMPRESSION MICRODISCECTOMY N/A 01/20/2018   Procedure: L4-5 decompression;  Surgeon: Barbarann Oneil BROCKS, MD;  Location: [facility] OR;  Service: Orthopedics;  Laterality: N/A;   THYROIDECTOMY, PARTIAL  1960's   TOTAL KNEE ARTHROPLASTY Right 12/04/2019   Procedure: RIGHT TOTAL KNEE ARTHROPLASTY;  Surgeon: Barbarann Oneil BROCKS, MD;  Location: [facility] OR;  Service: Orthopedics;  Laterality: Right;  RNFA APRIL PLEASE   VAGINAL MASS EXCISION  1994   Laser surgery for vaginal cancer; followed by chemotherapy (06/27/2012)    FAMILY HISTORY: Family History  Problem Relation Age of Onset   Heart disease Father    Heart disease Mother    Lung cancer Mother    Allergies Sister    Parkinsonism Sister  possible   Asthma Sister    Asthma Paternal Grandmother    Stroke Paternal Grandmother    Heart disease Other        paternal grandparents, maternal grandparents,    Heart  disease Brother    Emphysema Brother    Aneurysm Brother        x3   Kidney failure Brother    Diabetes Brother    Diabetes Brother    Stroke Brother    Stroke Maternal Grandmother    Breast cancer Neg Hx    Colon cancer Neg Hx    Heart attack Neg Hx    Esophageal cancer Neg Hx    Rectal cancer Neg Hx    Stomach cancer Neg Hx     SOCIAL HISTORY: Social History   Socioeconomic History   Marital status: Widowed    Spouse name: Aida   Number of children: 2   Years of education: masters   Highest education level: Not on file  Occupational History   Occupation: Retired, disable since 2000    Employer: RETIRED  Tobacco Use   Smoking status: Former    Current packs/day: 0.00    Average packs/day: 0.3 packs/day for 5.0 years (1.3 ttl pk-yrs)    Types: Cigarettes    Start date: 01/29/1993    Quit date: 01/29/1998    Years since quitting: 25.3    Passive exposure: Past   Smokeless tobacco: Never   Tobacco comments:    Quit in 2001  Vaping Use   Vaping status: Never Used  Substance and Sexual Activity   Alcohol  use: No    Alcohol /week: 0.0 standard drinks of alcohol    Drug use: No   Sexual activity: Never  Other Topics Concern   Not on file  Social History Narrative   On disability since 2000--- also husband has MS   Education. College.   Right handed.      02/15/2022   Patient's daughter lives with her   Social Drivers of Corporate investment banker Strain: Low Risk  (05/03/2023)   Overall Financial Resource Strain (CARDIA)    Difficulty of Paying Living Expenses: Not hard at all  Food Insecurity: No Food Insecurity (05/03/2023)   Hunger Vital Sign    Worried About Running Out of Food in the Last Year: Never true    Ran Out of Food in the Last Year: Never true  Transportation Needs: No Transportation Needs (05/03/2023)   PRAPARE - Administrator, Civil Service (Medical): No    Lack of Transportation (Non-Medical): No  Physical Activity: Sufficiently  Active (05/03/2023)   Exercise Vital Sign    Days of Exercise per Week: 5 days    Minutes of Exercise per Session: 30 min  Recent Concern: Physical Activity - Insufficiently Active (05/03/2023)   Exercise Vital Sign    Days of Exercise per Week: 3 days    Minutes of Exercise per Session: 20 min  Stress: No Stress Concern Present (05/03/2023)   [employer] of Occupational Health - Occupational Stress Questionnaire    Feeling of Stress : Not at all  Social Connections: Moderately Isolated (05/03/2023)   Social Connection and Isolation Panel [NHANES]    Frequency of Communication with Friends and Family: More than three times a week    Frequency of Social Gatherings with Friends and Family: Never    Attends Religious Services: 1 to 4 times per year    Active Member of Clubs or Organizations: No  Attends Banker Meetings: Never    Marital Status: Widowed  Intimate Partner Violence: Not At Risk (05/03/2023)   Humiliation, Afraid, Rape, and Kick questionnaire    Fear of Current or Ex-Partner: No    Emotionally Abused: No    Physically Abused: No    Sexually Abused: No      Modena Callander, M.D. Ph.D.  [facility] [address], [address] [place], [place] [address] Ph: [phone] Fax: [phone]  CC:  Geofm Glade PARAS, MD [address] [place],  [place] [address]  Geofm Glade PARAS, MD

## 2023-06-05 NOTE — Progress Notes (Signed)
 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

## 2023-06-19 ENCOUNTER — Other Ambulatory Visit: Payer: Self-pay | Admitting: Adult Health

## 2023-06-24 ENCOUNTER — Other Ambulatory Visit: Payer: Self-pay | Admitting: Internal Medicine

## 2023-06-27 ENCOUNTER — Telehealth: Payer: Self-pay | Admitting: Neurology

## 2023-06-27 NOTE — Telephone Encounter (Signed)
 Pt called stating that she has been feeling very weak . Pt states that she has been feeling pain all over her body . Pt fell down and  both her knees are hurting. Pt states medication has been making her sleepy Pt is not sure which medication is making her feel this way .

## 2023-06-27 NOTE — Telephone Encounter (Signed)
 Call to patient, no answer. Left message to return call

## 2023-06-28 NOTE — Telephone Encounter (Signed)
 Pt returned call. Pt stated that she did not have her cell phone on her and that is why she missed the call. Pt stated that she can be reached at  [phone] when RN returns her call.

## 2023-07-01 NOTE — Telephone Encounter (Signed)
Call to patient, no answer left message to call back.

## 2023-07-10 ENCOUNTER — Other Ambulatory Visit: Payer: Self-pay

## 2023-07-10 DIAGNOSIS — R002 Palpitations: Secondary | ICD-10-CM

## 2023-07-10 DIAGNOSIS — R079 Chest pain, unspecified: Secondary | ICD-10-CM

## 2023-07-10 MED ORDER — ISOSORBIDE MONONITRATE ER 30 MG PO TB24: 90.0000 mg | ORAL_TABLET | Freq: Every day | ORAL | 1 refills | Status: DC

## 2023-07-15 ENCOUNTER — Other Ambulatory Visit: Payer: Self-pay | Admitting: Internal Medicine

## 2023-07-16 ENCOUNTER — Telehealth: Payer: Self-pay | Admitting: Internal Medicine

## 2023-07-16 NOTE — Telephone Encounter (Signed)
 Copied from CRM [id number]. Topic: Clinical - Request for Lab/Test Order >> Jul 16, 2023 11:31 AM Samantha [name] wrote: Reason for CRM: Patient would like to have lab done due to her fslling knees buckled, hit head was so weak could not get up for at least 15-20 mins, headaches everyday.. Muscle weakness/nauseous. Muscle pain in calf and knees

## 2023-07-17 ENCOUNTER — Ambulatory Visit: Admitting: Internal Medicine

## 2023-07-17 ENCOUNTER — Encounter: Payer: Self-pay | Admitting: Internal Medicine

## 2023-07-17 ENCOUNTER — Ambulatory Visit: Payer: Self-pay | Admitting: Internal Medicine

## 2023-07-17 VITALS — BP 166/96 | HR 77 | Temp 98.5°F | Ht 62.0 in | Wt 202.0 lb

## 2023-07-17 DIAGNOSIS — M791 Myalgia, unspecified site: Secondary | ICD-10-CM | POA: Diagnosis not present

## 2023-07-17 DIAGNOSIS — I10 Essential (primary) hypertension: Secondary | ICD-10-CM

## 2023-07-17 DIAGNOSIS — R0602 Shortness of breath: Secondary | ICD-10-CM

## 2023-07-17 DIAGNOSIS — R5383 Other fatigue: Secondary | ICD-10-CM | POA: Diagnosis not present

## 2023-07-17 LAB — COMPREHENSIVE METABOLIC PANEL WITH GFR
ALT: 13 U/L (ref 0–35)
AST: 16 U/L (ref 0–37)
Albumin: 4.4 g/dL (ref 3.5–5.2)
Alkaline Phosphatase: 49 U/L (ref 39–117)
BUN: 25 mg/dL — ABNORMAL HIGH (ref 6–23)
CO2: 33 meq/L — ABNORMAL HIGH (ref 19–32)
Calcium: 9.4 mg/dL (ref 8.4–10.5)
Chloride: 102 meq/L (ref 96–112)
Creatinine, Ser: 1.09 mg/dL (ref 0.40–1.20)
GFR: 47.75 mL/min — ABNORMAL LOW (ref >60.00)
Glucose, Bld: 87 mg/dL (ref 70–99)
Potassium: 4.2 meq/L (ref 3.5–5.1)
Sodium: 139 meq/L (ref 135–145)
Total Bilirubin: 0.5 mg/dL (ref 0.2–1.2)
Total Protein: 7.2 g/dL (ref 6.0–8.3)

## 2023-07-17 LAB — CBC WITH DIFFERENTIAL/PLATELET
Basophils Absolute: 0 10*3/uL (ref 0.0–0.1)
Basophils Relative: 0.7 % (ref 0.0–3.0)
Eosinophils Absolute: 0.2 10*3/uL (ref 0.0–0.7)
Eosinophils Relative: 4.8 % (ref 0.0–5.0)
HCT: 41.2 % (ref 36.0–46.0)
Hemoglobin: 13.6 g/dL (ref 12.0–15.0)
Lymphocytes Relative: 41.6 % (ref 12.0–46.0)
Lymphs Abs: 1.6 10*3/uL (ref 0.7–4.0)
MCHC: 33.1 g/dL (ref 30.0–36.0)
MCV: 84.6 fl (ref 78.0–100.0)
Monocytes Absolute: 0.5 10*3/uL (ref 0.1–1.0)
Monocytes Relative: 12.4 % — ABNORMAL HIGH (ref 3.0–12.0)
Neutro Abs: 1.6 10*3/uL (ref 1.4–7.7)
Neutrophils Relative %: 40.5 % — ABNORMAL LOW (ref 43.0–77.0)
Platelets: 161 10*3/uL (ref 150.0–400.0)
RBC: 4.86 Mil/uL (ref 3.87–5.11)
RDW: 16.1 % — ABNORMAL HIGH (ref 11.5–15.5)
WBC: 3.9 10*3/uL — ABNORMAL LOW (ref 4.0–10.5)

## 2023-07-17 LAB — CK: Total CK: 196 U/L — ABNORMAL HIGH (ref 7–177)

## 2023-07-17 LAB — BRAIN NATRIURETIC PEPTIDE: Pro B Natriuretic peptide (BNP): 37 pg/mL (ref 0.0–100.0)

## 2023-07-17 NOTE — Patient Instructions (Addendum)
      Blood work was ordered.       Medications changes include :   None      Return if symptoms worsen or fail to improve.

## 2023-07-17 NOTE — Assessment & Plan Note (Signed)
 Chronic Blood pressure very elevated initially-will recheck Typically much better controlled Advised to monitor at home Continue Imdur  90 mg daily, losartan  25 mg daily and Bystolic  10 mg daily Cmp

## 2023-07-17 NOTE — Assessment & Plan Note (Signed)
 Acute Likely related to her recent fall and all of the stress she has been under recently I do not think this is related to the Botox Will check CBC, CMP, BNP given shortness of breath Over the next week and a half she does have time to rest and will try not to do much except for things around the house I expect her fatigue to improve and if it does not she will let me know
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
## 2023-07-17 NOTE — Telephone Encounter (Signed)
 Called patient today and left message for her to contact office back.  Currently have her in today at 2:20 to be seen.  Just need to know if she can come in or not.

## 2023-07-17 NOTE — Assessment & Plan Note (Signed)
 Acute Started after her fall on 5/19 I doubt this is related to the Botox since it started after the fall which was 2 weeks after her Botox injections She was on the floor for 30 minutes so this could be contributing some-there may be a slight element of rhabdomyolysis I also think some of the stress she has been under is probably contributing Will check CBC, CMP, CK She will rest over the next week week and a half and she will let me know if her symptoms are not improving

## 2023-07-17 NOTE — Telephone Encounter (Signed)
 Attempted to reach patient several times but no answer.  Will cancel appointment for today.

## 2023-07-17 NOTE — Telephone Encounter (Signed)
 Was able to reach patient and she will come in today.

## 2023-07-17 NOTE — Assessment & Plan Note (Addendum)
 Acute Likely related to her asthma as she is also having some coughing and wheezing Given the fatigue and shortness of breath will check a BNP since she has chronic diastolic heart failure Continue asthma treatments-Symbicort  2 puffs twice daily, albuterol  nebulizer every 6 hours as needed Depending on blood work will evaluate further

## 2023-08-12 ENCOUNTER — Other Ambulatory Visit: Payer: Self-pay | Admitting: Pharmacist

## 2023-08-12 DIAGNOSIS — I2510 Atherosclerotic heart disease of native coronary artery without angina pectoris: Secondary | ICD-10-CM

## 2023-08-12 DIAGNOSIS — E785 Hyperlipidemia, unspecified: Secondary | ICD-10-CM

## 2023-08-12 MED ORDER — REPATHA SURECLICK 140 MG/ML ~~LOC~~ SOAJ: SUBCUTANEOUS | 3 refills | Status: DC

## 2023-08-14 ENCOUNTER — Ambulatory Visit (INDEPENDENT_AMBULATORY_CARE_PROVIDER_SITE_OTHER): Admitting: Podiatry

## 2023-08-14 ENCOUNTER — Encounter: Payer: Self-pay | Admitting: Podiatry

## 2023-08-14 DIAGNOSIS — B351 Tinea unguium: Secondary | ICD-10-CM

## 2023-08-14 DIAGNOSIS — M79674 Pain in right toe(s): Secondary | ICD-10-CM

## 2023-08-14 DIAGNOSIS — Z794 Long term (current) use of insulin: Secondary | ICD-10-CM | POA: Diagnosis not present

## 2023-08-14 DIAGNOSIS — L84 Corns and callosities: Secondary | ICD-10-CM

## 2023-08-14 DIAGNOSIS — M79675 Pain in left toe(s): Secondary | ICD-10-CM

## 2023-08-14 DIAGNOSIS — E1142 Type 2 diabetes mellitus with diabetic polyneuropathy: Secondary | ICD-10-CM | POA: Diagnosis not present

## 2023-08-19 ENCOUNTER — Encounter: Payer: Self-pay | Admitting: Podiatry

## 2023-08-19 NOTE — Progress Notes (Signed)
 Subjective:  Patient ID: Samantha [name], female    DOB: 07/14/1940,  MRN: [id number]  Samantha [name] presents to clinic today for at risk foot care with history of diabetic neuropathy and corn(s) left foot, callus(es) right foot and painful elongated, discolored, dystrophic nails.  Pain interferes with ambulation. Aggravating factors include wearing enclosed shoe gear. Painful toenails interfere with ambulation. Aggravating factors include wearing enclosed shoe gear. Pain is relieved with periodic professional debridement. Painful corns and calluses are aggravated when weightbearing with and without shoegear. Pain is relieved with periodic professional debridement.  Chief Complaint  Patient presents with   [facility]    Rm17 DR Geofm last visit June 2025/diabetic A1c6   New problem(s): None.   PCP is Geofm Glade PARAS, MD.  Allergies  Allergen Reactions   Bactrim  [Sulfamethoxazole -Trimethoprim ] Other (See Comments)    See OV 09-15-13, rash-tongue swelling due to bactrim  ?   Cefuroxime Axetil Hives   Oxycodone  Nausea And Vomiting   Pravastatin  Other (See Comments)    muscle breakdown with profuse sweating   Terfenadine Hives and Other (See Comments)    Other reaction(s): Other (See Comments)  Other reaction(s): Not available   Zocor  Eurotas.Fester ] Other (See Comments)    2012 Muscle breakdown  with profuse sweating   Tramadol Nausea And Vomiting   Atorvastatin  Other (See Comments)    Pt reports muscle aches and joint pains   Cefuroxime Other (See Comments)   Lactose Intolerance (Gi) Diarrhea   Latex     blisters   Lime Flavor [Flavoring Agent (Non-Screening)] Diarrhea   Metformin  And Related Diarrhea   Oxycodone  Hcl Other (See Comments)   Pravastatin  Sodium Other (See Comments)   Rosuvastatin  Other (See Comments)    Pt reports myalgias, fatigue, nosebleeds.   Rosuvastatin  Calcium  Other (See Comments)   Tape Other (See Comments)    blisters   Wound Dressing Adhesive Other  (See Comments)    blisters    Review of Systems: Negative except as noted in the HPI.  Objective: No changes noted in today's physical examination. There were no vitals filed for this visit. Samantha [name] is a pleasant 83 y.o. female WD, WN in NAD. AAO x 3.  Vascular Examination: Capillary refill time immediate b/l. Vascular status intact b/l with palpable pedal pulses. Pedal hair present b/l. No pain with calf compression b/l. Skin temperature gradient WNL b/l. No cyanosis or clubbing b/l. No ischemia or gangrene noted b/l.   Neurological Examination: Sensation grossly intact b/l with 10 gram monofilament. Vibratory sensation intact b/l. Pt has subjective symptoms of neuropathy.  Dermatological Examination: Pedal skin with normal turgor, texture and tone b/l.  No open wounds. No interdigital macerations.   Toenails 1-5 b/l thick, discolored, elongated with subungual debris and pain on dorsal palpation.   Hyperkeratotic lesion(s) distal tip of left 3rd toe and medial IPJ of right great toe.  No erythema, no edema, no drainage, no fluctuance.  Musculoskeletal Examination: Muscle strength 5/5 to all lower extremity muscle groups bilaterally. HAV with bunion deformity noted b/l LE. Hammertoe(s) noted to the bilateral 2nd toes.. No pain, crepitus or joint limitation noted with ROM b/l LE.  Patient ambulates independently without assistive aids.  Radiographs: None  Last A1c:      Latest Ref Rng & Units 03/04/2023    4:17 PM 08/28/2022   11:35 AM  Hemoglobin A1C  Hemoglobin-A1c 4.6 - 6.5 % 6.3  6.5    Assessment/Plan: 1. Pain due to onychomycosis of toenails of both  feet   2. Corns and callosities   3. Controlled type 2 diabetes mellitus with diabetic polyneuropathy, with long-term current use of insulin  [facility])     Consent given for treatment. Patient examined. All patient's and/or POA's questions/concerns addressed on today's visit. Mycotic toenails 1-5 debrided in length and  girth without incident. Corn(s)/callus(es) distal tip of left 3rd toe and medial IPJ of right great toe pared with sharp debridement without incident.Continue soft, supportive shoe gear daily. Report any pedal injuries to medical professional. Call office if there are any quesitons/concerns.  Return in about 3 months (around 11/14/2023).  [name], DPM      [facility] LOCATION: [address], [place] [address]                   Office [phone]   [facility] LOCATION: [address] [place], [place] [address] Office [phone]

## 2023-08-26 NOTE — Progress Notes (Unsigned)
 Subjective:    Patient ID: Samantha [name], female    DOB: [birth year], 83 y.o.   MRN: [id number]     HPI Kila is here for follow up of her chronic medical problems.  Her back has been bad since her fall - she has difficulty walking sometimes.  She is tripping on her right toe.  That is worse after the fall - she has an AFO brace at home.  She has pain going into the right lower back/buttock.    She will go see Dr Barbarann.    Medications and allergies reviewed with patient and updated if appropriate.  Current Outpatient Medications on File Prior to Visit  Medication Sig Dispense Refill   acetaminophen  (TYLENOL ) 500 MG tablet Take 500 mg by mouth every 6 (six) hours as needed for mild pain.      albuterol  (PROVENTIL ) (2.5 MG/3ML) 0.083% nebulizer solution Take 3 mLs (2.5 mg total) by nebulization every 6 (six) hours as needed for wheezing or shortness of breath. 150 mL 0   ALPRAZolam  (XANAX ) 0.5 MG tablet Take 1 tablet (0.5 mg total) by mouth 2 (two) times daily as needed for anxiety. 30 tablet 0   budesonide -formoterol  (SYMBICORT ) 160-4.5 MCG/ACT inhaler Inhale 2 puffs into the lungs 2 (two) times daily. 1 each 3   Calcium -Magnesium-Zinc (CAL-MAG-ZINC PO) Take 1 tablet by mouth daily.     cetirizine  (ZYRTEC ) 10 MG tablet TAKE 1 TABLET(10 MG) BY MOUTH DAILY 90 tablet 3   cholecalciferol  (VITAMIN D ) 1000 UNITS tablet Take 1,000 Units by mouth every morning.      clopidogrel  (PLAVIX ) 75 MG tablet TAKE 1 TABLET(75 MG) BY MOUTH DAILY 90 tablet 1   DULoxetine  (CYMBALTA ) 60 MG capsule Take 1 capsule (60 mg total) by mouth daily. 90 capsule 3   Evolocumab  (REPATHA  SURECLICK) 140 MG/ML SOAJ INJECT 1 PEN UNDER THE SKIN EVERY 14 DAYS 6 mL 3   famotidine  (PEPCID ) 40 MG tablet TAKE 1 TABLET(40 MG) BY MOUTH DAILY 90 tablet 1   FARXIGA  10 MG TABS tablet TAKE 1 TABLET(10 MG) BY MOUTH DAILY BEFORE BREAKFAST 30 tablet 2   glucose blood test strip Use as instructed 100 each 12    hydroxychloroquine  (PLAQUENIL ) 200 MG tablet Take 400 mg by mouth daily.      isosorbide  mononitrate (IMDUR ) 30 MG 24 hr tablet Take 3 tablets (90 mg total) by mouth daily. 270 tablet 1   Lancets (ONETOUCH ULTRASOFT) lancets Use as instructed 100 each 12   losartan  (COZAAR ) 25 MG tablet TAKE 1 TABLET(25 MG) BY MOUTH DAILY 90 tablet 3   nebivolol  (BYSTOLIC ) 10 MG tablet TAKE 1 TABLET(10 MG) BY MOUTH DAILY 90 tablet 2   nitroGLYCERIN  (NITROSTAT ) 0.4 MG SL tablet Place 1 tablet (0.4 mg total) under the tongue every 5 (five) minutes as needed for chest pain. 25 tablet 11   Polyethyl Glycol-Propyl Glycol (SYSTANE) 0.4-0.3 % GEL ophthalmic gel Place 1 application into both eyes daily as needed (for dry eyes).      pregabalin  (LYRICA ) 75 MG capsule TAKE 1 CAPSULE(75 MG) BY MOUTH THREE TIMES DAILY 90 capsule 0   psyllium (METAMUCIL) 58.6 % powder Take 1 packet by mouth daily.     ranolazine  (RANEXA ) 500 MG 12 hr tablet TAKE 1 TABLET(500 MG) BY MOUTH TWICE DAILY 180 tablet 3   Rimegepant Sulfate (NURTEC) 75 MG TBDP Take 1 tab at onset of migraine.  May repeat in 2 hrs, if needed.  Max dose: 2  tabs/day. This is a 30 day prescription. 16 tablet 11   torsemide  (DEMADEX ) 20 MG tablet Take one tablet by mouth every third day. 24 tablet 3   Ubrogepant  (UBRELVY ) 100 MG TABS Take 100 mg by mouth if needed at onset of migraine. May repeat in 2 hours if headache returns or persists. Max 200 mg per 24 hours. 16 tablet 3   Current Facility-Administered Medications on File Prior to Visit  Medication Dose Route Frequency Provider Last Rate Last Admin   botulinum toxin Type A  (BOTOX ) injection 200 Units  200 Units Intramuscular Once Onita Duos, MD         Review of Systems  Constitutional:  Negative for fever.  Respiratory:  Positive for wheezing (occ - asthma). Negative for cough and shortness of breath.   Cardiovascular:  Positive for chest pain (occ discomfort). Negative for palpitations and leg swelling.   Gastrointestinal:        Thalia controlled  Neurological:  Positive for headaches. Negative for light-headedness.       Objective:   Vitals:   08/27/23 1032  BP: 128/84  Pulse: 62  Temp: 99.2 F (37.3 C)  SpO2: 92%   BP Readings from Last 3 Encounters:  08/27/23 128/84  07/17/23 (!) 166/96  06/05/23 121/76   Wt Readings from Last 3 Encounters:  08/27/23 201 lb (91.2 kg)  07/17/23 202 lb (91.6 kg)  05/09/23 207 lb (93.9 kg)   Body mass index is 36.76 kg/m.    Physical Exam Constitutional:      General: She is not in acute distress.    Appearance: Normal appearance.  HENT:     Head: Normocephalic and atraumatic.  Eyes:     Conjunctiva/sclera: Conjunctivae normal.  Cardiovascular:     Rate and Rhythm: Normal rate and regular rhythm.     Heart sounds: Normal heart sounds.  Pulmonary:     Effort: Pulmonary effort is normal. No respiratory distress.     Breath sounds: Normal breath sounds. No wheezing.  Musculoskeletal:     Cervical back: Neck supple.     Right lower leg: No edema.     Left lower leg: No edema.  Lymphadenopathy:     Cervical: No cervical adenopathy.  Skin:    General: Skin is warm and dry.     Findings: No rash.  Neurological:     Mental Status: She is alert. Mental status is at baseline.  Psychiatric:        Mood and Affect: Mood normal.        Behavior: Behavior normal.        Lab Results  Component Value Date   WBC 3.9 (L) 07/17/2023   HGB 13.6 07/17/2023   HCT 41.2 07/17/2023   PLT 161.0 07/17/2023   GLUCOSE 87 07/17/2023   CHOL 124 03/04/2023   TRIG 169.0 (H) 03/04/2023   HDL 70.20 03/04/2023   LDLDIRECT 126.2 12/07/2010   LDLCALC 20 03/04/2023   ALT 13 07/17/2023   AST 16 07/17/2023   NA 139 07/17/2023   K 4.2 07/17/2023   CL 102 07/17/2023   CREATININE 1.09 07/17/2023   BUN 25 (H) 07/17/2023   CO2 33 (H) 07/17/2023   TSH 2.44 08/28/2022   INR 0.95 07/22/2017   HGBA1C 6.3 03/04/2023   MICROALBUR 0.6 03/03/2010      Assessment & Plan:    See Problem List for Assessment and Plan of chronic medical problems.

## 2023-08-26 NOTE — Patient Instructions (Addendum)
      Go to the lab for a urine test.    Your A1c is  5.9%    Medications changes include :   None     Return in about 6 months (around 02/27/2024) for follow up.

## 2023-08-27 ENCOUNTER — Encounter: Payer: Self-pay | Admitting: Internal Medicine

## 2023-08-27 ENCOUNTER — Ambulatory Visit (INDEPENDENT_AMBULATORY_CARE_PROVIDER_SITE_OTHER): Payer: Medicare Other | Admitting: Internal Medicine

## 2023-08-27 VITALS — BP 128/84 | HR 62 | Temp 99.2°F | Ht 62.0 in | Wt 201.0 lb

## 2023-08-27 DIAGNOSIS — I5032 Chronic diastolic (congestive) heart failure: Secondary | ICD-10-CM | POA: Diagnosis not present

## 2023-08-27 DIAGNOSIS — N1831 Chronic kidney disease, stage 3a: Secondary | ICD-10-CM | POA: Diagnosis not present

## 2023-08-27 DIAGNOSIS — G629 Polyneuropathy, unspecified: Secondary | ICD-10-CM | POA: Diagnosis not present

## 2023-08-27 DIAGNOSIS — M21371 Foot drop, right foot: Secondary | ICD-10-CM | POA: Diagnosis not present

## 2023-08-27 DIAGNOSIS — J4530 Mild persistent asthma, uncomplicated: Secondary | ICD-10-CM | POA: Diagnosis not present

## 2023-08-27 DIAGNOSIS — E1142 Type 2 diabetes mellitus with diabetic polyneuropathy: Secondary | ICD-10-CM | POA: Diagnosis not present

## 2023-08-27 DIAGNOSIS — E782 Mixed hyperlipidemia: Secondary | ICD-10-CM | POA: Diagnosis not present

## 2023-08-27 DIAGNOSIS — K219 Gastro-esophageal reflux disease without esophagitis: Secondary | ICD-10-CM

## 2023-08-27 DIAGNOSIS — I10 Essential (primary) hypertension: Secondary | ICD-10-CM

## 2023-08-27 DIAGNOSIS — Z794 Long term (current) use of insulin: Secondary | ICD-10-CM

## 2023-08-27 DIAGNOSIS — M069 Rheumatoid arthritis, unspecified: Secondary | ICD-10-CM

## 2023-08-27 DIAGNOSIS — M797 Fibromyalgia: Secondary | ICD-10-CM

## 2023-08-27 LAB — MICROALBUMIN / CREATININE URINE RATIO
Creatinine,U: 97.2 mg/dL
Microalb Creat Ratio: 10.1 mg/g (ref 0.0–30.0)
Microalb, Ur: 1 mg/dL (ref 0.0–1.9)

## 2023-08-27 MED ORDER — ALBUTEROL SULFATE HFA 108 (90 BASE) MCG/ACT IN AERS: 2.0000 | INHALATION_SPRAY | Freq: Four times a day (QID) | RESPIRATORY_TRACT | 11 refills | Status: DC | PRN

## 2023-08-27 NOTE — Assessment & Plan Note (Signed)
 Chronic Controlled Continue Lyrica  75 mg 3 times daily-also taking for occipital neuralgia Continue Cymbalta  60 mg daily

## 2023-08-27 NOTE — Assessment & Plan Note (Signed)
Chronic Appears euvolemic Follows with cardiology Continue farxiga 10 mg daily, torsemide 20 mg every other day

## 2023-08-27 NOTE — Assessment & Plan Note (Signed)
 Chronic Has gotten worse - after her fall Has been tripping over her right foot Has AFO brace but not wearing it  - stressed wearing it daily

## 2023-08-27 NOTE — Assessment & Plan Note (Signed)
 Chronic Blood pressure very elevated initially-will recheck Typically much better controlled Advised to monitor at home Continue Imdur  90 mg daily, losartan  25 mg daily and Bystolic  10 mg daily

## 2023-08-27 NOTE — Assessment & Plan Note (Signed)
 Chronic Overall controlled Continue albuterol prn Continue symbicort to 160-4.5 mcg bid

## 2023-08-27 NOTE — Assessment & Plan Note (Signed)
 Chronic GERD controlled Continue pepcid 40 mg daily prn

## 2023-08-27 NOTE — Assessment & Plan Note (Signed)
 Chronic Continue lyrica  75 mg TID, cymbalta  60 mg daily

## 2023-08-27 NOTE — Assessment & Plan Note (Addendum)
 Chronic Lab Results  Component Value Date   HGBA1C 6.3 03/04/2023   Sugars are well-controlled Check A1c, urine albumin/ cr ratio A1c today is 5.9% Continue Farxiga  10 mg daily

## 2023-08-27 NOTE — Assessment & Plan Note (Signed)
 Chronic Check lipid panel  Continue repatha 140 mg q 14 days Regular exercise and healthy diet encouraged

## 2023-08-27 NOTE — Assessment & Plan Note (Signed)
 Chronic Stable urine albumin/creatinine ratio

## 2023-08-27 NOTE — Assessment & Plan Note (Signed)
 Chronic Following with Dr Leni On Plaquenil  400 mg daily

## 2023-08-28 ENCOUNTER — Other Ambulatory Visit: Payer: Self-pay | Admitting: Neurology

## 2023-08-28 ENCOUNTER — Ambulatory Visit
Admission: RE | Admit: 2023-08-28 | Discharge: 2023-08-28 | Disposition: A | Discharge status: Home / Self Care | Source: Ambulatory Visit | Attending: Neurology | Admitting: Neurology

## 2023-08-28 ENCOUNTER — Encounter: Payer: Self-pay | Admitting: Neurology

## 2023-08-28 ENCOUNTER — Ambulatory Visit (INDEPENDENT_AMBULATORY_CARE_PROVIDER_SITE_OTHER): Admitting: Neurology

## 2023-08-28 VITALS — BP 157/90 | HR 77 | Ht 62.0 in | Wt 203.6 lb

## 2023-08-28 DIAGNOSIS — G43709 Chronic migraine without aura, not intractable, without status migrainosus: Secondary | ICD-10-CM

## 2023-08-28 DIAGNOSIS — M545 Low back pain, unspecified: Secondary | ICD-10-CM

## 2023-08-28 DIAGNOSIS — M542 Cervicalgia: Secondary | ICD-10-CM

## 2023-08-28 DIAGNOSIS — R519 Headache, unspecified: Secondary | ICD-10-CM

## 2023-08-28 DIAGNOSIS — W19XXXD Unspecified fall, subsequent encounter: Secondary | ICD-10-CM

## 2023-08-28 DIAGNOSIS — M5136 Other intervertebral disc degeneration, lumbar region with discogenic back pain only: Secondary | ICD-10-CM | POA: Diagnosis not present

## 2023-08-28 DIAGNOSIS — M5134 Other intervertebral disc degeneration, thoracic region: Secondary | ICD-10-CM | POA: Diagnosis not present

## 2023-08-28 DIAGNOSIS — W19XXXA Unspecified fall, initial encounter: Secondary | ICD-10-CM | POA: Insufficient documentation

## 2023-08-28 LAB — POCT GLYCOSYLATED HEMOGLOBIN (HGB A1C)
HbA1c POC (<> result, manual entry): 5.9 % (ref 4.0–5.6)
HbA1c, POC (controlled diabetic range): 5.9 % (ref 0.0–7.0)
HbA1c, POC (prediabetic range): 5.9 % (ref 5.7–6.4)
Hemoglobin A1C: 5.9 % — AB (ref 4.0–5.6)

## 2023-08-28 MED ORDER — CELECOXIB 100 MG PO CAPS: 100.0000 mg | ORAL_CAPSULE | ORAL | 2 refills | Status: DC | PRN

## 2023-08-28 MED ORDER — ONABOTULINUMTOXINA 200 UNITS IJ SOLR
200.0000 [IU] | Freq: Once | INTRAMUSCULAR | Status: AC
  Administered 2023-08-28: 200 [IU] via INTRAMUSCULAR

## 2023-08-28 NOTE — Progress Notes (Signed)
 Chief Complaint  Patient presents with   Injections    Room 14 Pt is alone  Regular botox  , pt stated that she fell a day ago  hit head and back she has follow up pcp that tod her to follow up UC       ASSESSMENT AND PLAN  Samantha [name] is a 83 y.o. female   Chronic migraine Also component of cervicogenic pain due to her severe cervical degenerative disease, rheumatoid arthritis, Her headache responded to CGRP antagonist, Nurtec works better than Ubrelvy ,  approved by insurance. She has history of cervical decompression surgery, MRI in February 2024 showed ACDF from C3-C6, prominent osteophyte protrusion at C6-7, with mild canal severe right and moderate left foraminal narrowing, Significant tenderness around left nuchal line  Higher dose of Cymbalta  60 mg daily was helpful. Suggest warm compression, massage.  Botox  injection for chronic migraine prevention, injection was performed according to [place] protocol,  5 units of Botox  was injected into each side,    Bilateral frontalis 4 injection sites Bilateral temporalis 8 injection sites Bilateral occipitalis 6 injection sites Bilateral cervical paraspinals 4 injection sites Bilateral upper trapezius 6 injection sites  Extra 60 unites were injected into bilateral levator scapular and upper cervical paraspinals.  Fall, low back and upper back pain  X-ray  Celebrex  prn  DIAGNOSTIC DATA (LABS, IMAGING, TESTING) - I reviewed patient records, labs, notes, testing and imaging myself where available. Addendum: [employer] July 12, 2023, Nurtec is not covered, Preferred options are rizatriptan, Ubrelvy , Qulipta, Emgality, naratriptan, sumatriptan   MEDICAL HISTORY:   Samantha [name] is a 83 year old female, seen in request by orthopedic surgeon Barbarann Anes C, for evaluation of severe neck pain, radiating pain to occipital region, frequent headaches, her primary care physician is Dr. Geofm, Glade   I  reviewed and summarized the referring note.PMHX RA, TIA DM Chronic kidney disease. HTN Cervical decompression surgery in 2000, prior to surgery, she has significant right cervical radiculopathy   She did have a history of cervical decompression surgery in 2000, prior to the surgery, she presented with right cervical radiculopathy, surgery did help her symptoms significantly  Since January 2023, she began to noticed significant headaches, no clear trigger noted, she now complains of neck pain, radiating pain to left occipital region, then settled behind her left eye, worsening bilateral ear tinnitus, her left side neck pain headache happening on a daily basis,  She also seems to have worsening left-sided difficulty, left leg gave out underneath her, dragging left toes, rely on a walker now, also had worsening urinary urgency involving bowel and bladder,   I personally reviewed x-ray cervical region December 2022, 3 level cervical fusion C3-4, adjacent C6-7 disc space narrowing and spondylosis   She also have significant low back pain, is under the care of of Dr. Barbarann, I personally reviewed MRI of lumbar November 2019, severe canal stenosis L4-5, multilevel degenerative changes, neuroforaminal narrowing at all levels, multilevel extraforaminal disc protrusion that may affect to the accelerated L T12, right L2, right L3 nerve roots  She also complains of significant stress, she lives with her daughter, who suffered endometriosis cancer  UPDATE May 09 2023: She can continue to have frequent headaches at least once a week, previous Nurtec works much better than Ubrelvy  she is currently taking, which take longer to take effect  She has severe neck pain especially at the left nuchal region triggered by hyperextension, she went to the dentist couple weeks ago,  complains such severe neck pain, trigger body shaking, lasting for few hours,  Cymbalta  30 mg helps her some, tolerating it well  MRI  cervical spine in February 2024 showed stable change of ACDF C3-C6, with prominent disc osteophyte protrusion at C6-7, with mild canal severe right and moderate left foraminal narrowing, C2-3 also showed mild right foraminal narrowing  UPDATE May 7th 2025: Nurtec was approved. She still complains of frequent headaches, more at occipital regions, neck and shoulder pain.  Cymbalta  higher dose 60mg  was helpful.  UPDATE August 28 2023: She is under a lot of stress, helping her daughters, previous Botox  injection did help her headache, also neck shoulder pain  She fell at the beginning of July 2025, have significant low back pain since, extending to the upper thoracic level, unsteady gait walk with a cane   PHYSICAL EXAM:   Vitals:   08/28/23 1356  BP: (!) 157/90  Pulse: 77  Weight: 203 lb 9.6 oz (92.4 kg)  Height: 5' 2 (1.575 m)   PHYSICAL EXAMNIATION:  Gen: NAD, conversant, well nourised, well groomed                     Cardiovascular: Regular rate rhythm, no peripheral edema, warm, nontender. Eyes: Conjunctivae clear without exudates or hemorrhage Neck: Supple, no carotid bruits.   Pulmonary: Clear to auscultation bilaterally   NEUROLOGICAL EXAM:  MENTAL STATUS: Speech/cognition: Awake, alert, oriented to history taking and casual conversation CRANIAL NERVES: CN II: Visual fields are full to confrontation. Pupils are round equal and briskly reactive to light. CN III, IV, VI: extraocular movement are normal. No ptosis. CN V: Facial sensation is intact to light touch CN VII: Face is symmetric with normal eye closure  CN VIII: Hearing is normal to causal conversation. CN IX, X: Phonation is normal. CN XI: Head turning and shoulder shrug are intact  MOTOR: There is no pronator drift of out-stretched arms. Muscle bulk and tone are normal. Muscle strength is normal.  Tenderness along upper thoracic region  REFLEXES: Reflexes are 1 and symmetric at the biceps, triceps, knees,  and ankles. Plantar responses are flexor.  SENSORY: Intact to light touch,    COORDINATION: There is no trunk or limb dysmetria noted.  GAIT/STANCE: Push-up to get up from seated position, cautious, mildly antalgic  REVIEW OF SYSTEMS:  Full 14 system review of systems performed and notable only for as above All other review of systems were negative.   ALLERGIES: Allergies  Allergen Reactions   Bactrim  [Sulfamethoxazole -Trimethoprim ] Other (See Comments)    See OV 09-15-13, rash-tongue swelling due to bactrim  ?   Cefuroxime Axetil Hives   Oxycodone  Nausea And Vomiting   Pravastatin  Other (See Comments)    muscle breakdown with profuse sweating   Terfenadine Hives and Other (See Comments)    Other reaction(s): Other (See Comments)  Other reaction(s): Not available   Zocor  [Simvastatin ] Other (See Comments)    2012 Muscle breakdown  with profuse sweating   Tramadol Nausea And Vomiting   Atorvastatin  Other (See Comments)    Pt reports muscle aches and joint pains   Cefuroxime Other (See Comments)   Lactose Intolerance (Gi) Diarrhea   Latex     blisters   Lime Flavor [Flavoring Agent (Non-Screening)] Diarrhea   Metformin  And Related Diarrhea   Oxycodone  Hcl Other (See Comments)   Pravastatin  Sodium Other (See Comments)   Rosuvastatin  Other (See Comments)    Pt reports myalgias, fatigue, nosebleeds.   Rosuvastatin  Calcium  Other (  See Comments)   Tape Other (See Comments)    blisters   Wound Dressing Adhesive Other (See Comments)    blisters    HOME MEDICATIONS: Current Outpatient Medications  Medication Sig Dispense Refill   acetaminophen  (TYLENOL ) 500 MG tablet Take 500 mg by mouth every 6 (six) hours as needed for mild pain.      albuterol  (PROVENTIL ) (2.5 MG/3ML) 0.083% nebulizer solution Take 3 mLs (2.5 mg total) by nebulization every 6 (six) hours as needed for wheezing or shortness of breath. 150 mL 0   albuterol  (VENTOLIN  HFA) 108 (90 Base) MCG/ACT inhaler  Inhale 2 puffs into the lungs every 6 (six) hours as needed for wheezing or shortness of breath. 18 g 11   ALPRAZolam  (XANAX ) 0.5 MG tablet Take 1 tablet (0.5 mg total) by mouth 2 (two) times daily as needed for anxiety. 30 tablet 0   budesonide -formoterol  (SYMBICORT ) 160-4.5 MCG/ACT inhaler Inhale 2 puffs into the lungs 2 (two) times daily. 1 each 3   Calcium -Magnesium-Zinc (CAL-MAG-ZINC PO) Take 1 tablet by mouth daily.     cetirizine  (ZYRTEC ) 10 MG tablet TAKE 1 TABLET(10 MG) BY MOUTH DAILY 90 tablet 3   cholecalciferol  (VITAMIN D ) 1000 UNITS tablet Take 1,000 Units by mouth every morning.      clopidogrel  (PLAVIX ) 75 MG tablet TAKE 1 TABLET(75 MG) BY MOUTH DAILY 90 tablet 1   DULoxetine  (CYMBALTA ) 60 MG capsule Take 1 capsule (60 mg total) by mouth daily. 90 capsule 3   Evolocumab  (REPATHA  SURECLICK) 140 MG/ML SOAJ INJECT 1 PEN UNDER THE SKIN EVERY 14 DAYS 6 mL 3   famotidine  (PEPCID ) 40 MG tablet TAKE 1 TABLET(40 MG) BY MOUTH DAILY 90 tablet 1   FARXIGA  10 MG TABS tablet TAKE 1 TABLET(10 MG) BY MOUTH DAILY BEFORE BREAKFAST 30 tablet 2   glucose blood test strip Use as instructed 100 each 12   hydroxychloroquine  (PLAQUENIL ) 200 MG tablet Take 400 mg by mouth daily.      isosorbide  mononitrate (IMDUR ) 30 MG 24 hr tablet Take 3 tablets (90 mg total) by mouth daily. 270 tablet 1   Lancets (ONETOUCH ULTRASOFT) lancets Use as instructed 100 each 12   losartan  (COZAAR ) 25 MG tablet TAKE 1 TABLET(25 MG) BY MOUTH DAILY 90 tablet 3   nebivolol  (BYSTOLIC ) 10 MG tablet TAKE 1 TABLET(10 MG) BY MOUTH DAILY 90 tablet 2   nitroGLYCERIN  (NITROSTAT ) 0.4 MG SL tablet Place 1 tablet (0.4 mg total) under the tongue every 5 (five) minutes as needed for chest pain. 25 tablet 11   Polyethyl Glycol-Propyl Glycol (SYSTANE) 0.4-0.3 % GEL ophthalmic gel Place 1 application into both eyes daily as needed (for dry eyes).      pregabalin  (LYRICA ) 75 MG capsule TAKE 1 CAPSULE(75 MG) BY MOUTH THREE TIMES DAILY 90 capsule 0    psyllium (METAMUCIL) 58.6 % powder Take 1 packet by mouth daily.     ranolazine  (RANEXA ) 500 MG 12 hr tablet TAKE 1 TABLET(500 MG) BY MOUTH TWICE DAILY 180 tablet 3   Rimegepant Sulfate (NURTEC) 75 MG TBDP Take 1 tab at onset of migraine.  May repeat in 2 hrs, if needed.  Max dose: 2 tabs/day. This is a 30 day prescription. 16 tablet 11   torsemide  (DEMADEX ) 20 MG tablet Take one tablet by mouth every third day. 24 tablet 3   Ubrogepant  (UBRELVY ) 100 MG TABS Take 100 mg by mouth if needed at onset of migraine. May repeat in 2 hours if headache returns or persists.  Max 200 mg per 24 hours. 16 tablet 3   Current Facility-Administered Medications  Medication Dose Route Frequency Provider Last Rate Last Admin   botulinum toxin Type A  (BOTOX ) injection 200 Units  200 Units Intramuscular Once Onita Duos, MD       botulinum toxin Type A  (BOTOX ) injection 200 Units  200 Units Intramuscular Once Onita Duos, MD        PAST MEDICAL HISTORY: Past Medical History:  Diagnosis Date   A-fib [facility])    Abnormal CT of the chest 2008   last CT4-l 2009:  . No f/u suggested    Allergy    Asthma    CAD (coronary artery disease)    a. Coronary CTA 10/16: Coronary Ca score 211, mod non-obstructive CAD with LM mild plaque (25-50%), mid LAD 50-69%. b. Neg nuc 06/2015.   Cataract    BILATERAL-REMOVED   Chronic diastolic CHF (congestive heart failure) (HCC)    Collagen vascular disease (HCC)    arterial sclerosis per pt   Complication of anesthesia    trouble waking up   Fibromyalgia    GERD (gastroesophageal reflux disease)    H/O hiatal hernia    Hearing loss    left ear   Heart murmur    Hyperlipidemia    Hypertension    Kidney disease 04/04/2022   Malignant neoplasm of ascending colon (HCC) 2016   Minimally invasive right hemicolectomy to be done    Neuromuscular disorder (HCC)    FIBROMYALGIA   Ocular migraine    OSA (obstructive sleep apnea) 09/2007   dx w/ a sleep study, not on  CPAP    Osteoarthritis    Osteoporosis    Pneumonia    double in 2004   PONV (postoperative nausea and vomiting)    Reactive airway disease 01/29/2002   dx of pseudoasthma / vcd in 2005 and nl sprirometry History of dyspnea, 2011,  improved after several medications were changed around Question of COPD, disproved July 06, 2009 with nl pft's       Rheumatoid factor positive    Shingles 11/2009   Sleep apnea    Stroke (HCC)    TIA (transient ischemic attack)    x2 - on Plavix  for this   Torn rotator cuff    right worse than left, both are torn   Tumor, 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 Esmeralda Sharps (done after colon surgery)   CATARACT EXTRACTION W/ INTRAOCULAR LENS  IMPLANT, BILATERAL  2012   COLON SURGERY  10/2014   EYE SURGERY Bilateral    torq lens for cataracts   LEFT HEART CATH AND CORONARY ANGIOGRAPHY N/A 03/22/2016   Procedure: Left Heart Cath and Coronary Angiography;  Surgeon: Victory LELON Sharps, MD;  Location: [facility] INVASIVE CV LAB;  Service: Cardiovascular;  Laterality: N/A;   LUMBAR LAMINECTOMY/DECOMPRESSION MICRODISCECTOMY N/A 01/20/2018   Procedure: L4-5 decompression;  Surgeon: Barbarann Oneil BROCKS, MD;  Location: [facility] OR;  Service: Orthopedics;  Laterality: N/A;  THYROIDECTOMY, PARTIAL  1960's   TOTAL KNEE ARTHROPLASTY Right 12/04/2019   Procedure: RIGHT TOTAL KNEE ARTHROPLASTY;  Surgeon: Barbarann Oneil BROCKS, MD;  Location: [facility] OR;  Service: Orthopedics;  Laterality: Right;  RNFA APRIL PLEASE   VAGINAL MASS EXCISION  1994   Laser surgery for vaginal cancer; followed by chemotherapy  (06/27/2012)    FAMILY HISTORY: Family History  Problem Relation Age of Onset   Heart disease Father    Heart disease Mother    Lung cancer Mother    Allergies Sister    Parkinsonism Sister        possible   Asthma Sister    Asthma Paternal Grandmother    Stroke Paternal Grandmother    Heart disease Other        paternal grandparents, maternal grandparents,    Heart disease Brother    Emphysema Brother    Aneurysm Brother        x3   Kidney failure Brother    Diabetes Brother    Diabetes Brother    Stroke Brother    Stroke Maternal Grandmother    Breast cancer Neg Hx    Colon cancer Neg Hx    Heart attack Neg Hx    Esophageal cancer Neg Hx    Rectal cancer Neg Hx    Stomach cancer Neg Hx     SOCIAL HISTORY: Social History   Socioeconomic History   Marital status: Widowed    Spouse name: Aida   Number of children: 2   Years of education: masters   Highest education level: Not on file  Occupational History   Occupation: Retired, disable since 2000    Employer: RETIRED  Tobacco Use   Smoking status: Former    Current packs/day: 0.00    Average packs/day: 0.3 packs/day for 5.0 years (1.3 ttl pk-yrs)    Types: Cigarettes    Start date: 01/29/1993    Quit date: 01/29/1998    Years since quitting: 25.5    Passive exposure: Past   Smokeless tobacco: Never   Tobacco comments:    Quit in 2001  Vaping Use   Vaping status: Never Used  Substance and Sexual Activity   Alcohol  use: No    Alcohol /week: 0.0 standard drinks of alcohol    Drug use: No   Sexual activity: Never  Other Topics Concern   Not on file  Social History Narrative   On disability since 2000--- also husband has MS   Education. College.   Right handed.      02/15/2022   Patient's daughter lives with her   Social Drivers of Corporate investment banker Strain: Low Risk  (05/03/2023)   Overall Financial Resource Strain (CARDIA)    Difficulty of Paying Living Expenses: Not hard at all  Food  Insecurity: No Food Insecurity (05/03/2023)   Hunger Vital Sign    Worried About Running Out of Food in the Last Year: Never true    Ran Out of Food in the Last Year: Never true  Transportation Needs: No Transportation Needs (05/03/2023)   PRAPARE - Administrator, Civil Service (Medical): No    Lack of Transportation (Non-Medical): No  Physical Activity: Sufficiently Active (05/03/2023)   Exercise Vital Sign    Days of Exercise per Week: 5 days    Minutes of Exercise per Session: 30 min  Recent Concern: Physical Activity - Insufficiently Active (05/03/2023)   Exercise Vital Sign    Days of Exercise per Week: 3 days  Minutes of Exercise per Session: 20 min  Stress: No Stress Concern Present (05/03/2023)   [employer] of Occupational Health - Occupational Stress Questionnaire    Feeling of Stress : Not at all  Social Connections: Moderately Isolated (05/03/2023)   Social Connection and Isolation Panel    Frequency of Communication with Friends and Family: More than three times a week    Frequency of Social Gatherings with Friends and Family: Never    Attends Religious Services: 1 to 4 times per year    Active Member of [employer] or Organizations: No    Attends Banker Meetings: Never    Marital Status: Widowed  Intimate Partner Violence: Not At Risk (05/03/2023)   Humiliation, Afraid, Rape, and Kick questionnaire    Fear of Current or Ex-Partner: No    Emotionally Abused: No    Physically Abused: No    Sexually Abused: No      Modena Callander, M.D. Ph.D.  [facility] [address] [place], [place] [address] Ph: [phone] Fax: [phone]  CC:  Geofm Glade PARAS, MD [address] [place],  [place] [address]  Geofm Glade PARAS, MD

## 2023-08-28 NOTE — Progress Notes (Signed)
 Botox - 200 units x 1 vial Lot: [id number] Expiration: 2027/10 NDC: 0023-3921-02  Bacteriostatic 0.9% Sodium Chloride - 4 mL  Lot: [id number] Expiration: 11/28/24 NDC: 9590803397  Dx: H56.290 .R51.9  B/B Witnessed by DELENA MOLT RN

## 2023-09-04 ENCOUNTER — Ambulatory Visit: Payer: Self-pay | Admitting: Neurology

## 2023-09-04 NOTE — Progress Notes (Signed)
 Cardiology Office Note:    Date: 09/06/2023  ID:  Samantha [name], Samantha [name] November 30, 1940, MRN [id number]  PCP:  Samantha Glade PARAS, MD   Follow up of CAD   History of Present Illness:    Samantha [name] is a 83 y.o. female with a hx of CAD, HFpEF, HTN, HL, T2DM, PVCs, reactive airway dz and TIA  2018  LHC   Mod DAD with 40% RCA 50% midLAD.  moderate  CAD by cath in 2018,   Treated with Imdur .    2021 Myoview  pre surgery   Normal    Oct 2021 Echo LVEF 60 to 65%   Asc aorta 38 mm   2022 Intermittent CP  Imdur  increased     Monitor showed rare ectopy    Sept 2022   Continued CP   Ranexa  500 bid started  .  Pt previously followed by [facility]..  I last saw the pt in 2024     About 1 month ago she fell   Having problems with back since  The pt denies CP with exertion   She does says that sometimes with eating she will feel very full in chest   Pepcid  doesn't help   Will ease over time   Does say she has had probmes swallowing pills   Started in 2001 after spinal surgery    Past Medical History:  Diagnosis Date   A-fib [facility])    Abnormal CT of the chest 2008   last CT4-l 2009:  . No f/u suggested    Allergy    Asthma    CAD (coronary artery disease)    a. Coronary CTA 10/16: Coronary Ca score 211, mod non-obstructive CAD with LM mild plaque (25-50%), mid LAD 50-69%. b. Neg nuc 06/2015.   Cataract    BILATERAL-REMOVED   Chronic diastolic CHF (congestive heart failure) (HCC)    Collagen vascular disease (HCC)    arterial sclerosis per pt   Complication of anesthesia    trouble waking up   Fibromyalgia    GERD (gastroesophageal reflux disease)    H/O hiatal hernia    Hearing loss    left ear   Heart murmur    Hyperlipidemia    Hypertension    Kidney disease 04/04/2022   Malignant neoplasm of ascending colon (HCC) 2016   Minimally invasive right hemicolectomy to be done    Neuromuscular disorder (HCC)    FIBROMYALGIA   Ocular migraine    OSA (obstructive sleep apnea)  09/2007   dx w/ a sleep study, not on  CPAP   Osteoarthritis    Osteoporosis    Pneumonia    double in 2004   PONV (postoperative nausea and vomiting)    Reactive airway disease 01/29/2002   dx of pseudoasthma / vcd in 2005 and nl sprirometry History of dyspnea, 2011,  improved after several medications were changed around Question of COPD, disproved July 06, 2009 with nl pft's       Rheumatoid factor positive    Shingles 11/2009   Sleep apnea    Stroke (HCC)    TIA (transient ischemic attack)    x2 - on Plavix  for this   Torn rotator cuff    right worse than left, both are torn   Tumor, 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 Esmeralda Sharps (done after colon surgery)   CATARACT EXTRACTION W/ INTRAOCULAR LENS  IMPLANT, BILATERAL  2012   COLON SURGERY  10/2014   EYE SURGERY Bilateral    torq lens for cataracts   LEFT HEART CATH AND CORONARY ANGIOGRAPHY N/A 03/22/2016   Procedure: Left Heart Cath and Coronary Angiography;  Surgeon: Victory LELON Sharps, MD;  Location: [facility] INVASIVE CV LAB;  Service: Cardiovascular;  Laterality: N/A;   LUMBAR LAMINECTOMY/DECOMPRESSION MICRODISCECTOMY N/A 01/20/2018   Procedure: L4-5 decompression;  Surgeon: Barbarann Oneil BROCKS, MD;  Location: [facility] OR;  Service: Orthopedics;  Laterality: N/A;   THYROIDECTOMY, PARTIAL  1960's   TOTAL KNEE ARTHROPLASTY Right 12/04/2019   Procedure: RIGHT TOTAL KNEE ARTHROPLASTY;  Surgeon: Barbarann Oneil BROCKS, MD;  Location: [facility] OR;  Service: Orthopedics;  Laterality: Right;  RNFA APRIL PLEASE   VAGINAL MASS EXCISION  1994   Laser surgery for vaginal cancer;  followed by chemotherapy (06/27/2012)    Current Medications: Current Meds  Medication Sig   acetaminophen  (TYLENOL ) 500 MG tablet Take 500 mg by mouth every 6 (six) hours as needed for mild pain.    albuterol  (PROVENTIL ) (2.5 MG/3ML) 0.083% nebulizer solution Take 3 mLs (2.5 mg total) by nebulization every 6 (six) hours as needed for wheezing or shortness of breath.   albuterol  (VENTOLIN  HFA) 108 (90 Base) MCG/ACT inhaler Inhale 2 puffs into the lungs every 6 (six) hours as needed for wheezing or shortness of breath.   ALPRAZolam  (XANAX ) 0.5 MG tablet Take 1 tablet (0.5 mg total) by mouth 2 (two) times daily as needed for anxiety.   budesonide -formoterol  (SYMBICORT ) 160-4.5 MCG/ACT inhaler Inhale 2 puffs into the lungs 2 (two) times daily.   Calcium -Magnesium-Zinc (CAL-MAG-ZINC PO) Take 1 tablet by mouth daily.   celecoxib  (CELEBREX ) 100 MG capsule Take 1 capsule (100 mg total) by mouth as needed.   cetirizine  (ZYRTEC ) 10 MG tablet TAKE 1 TABLET(10 MG) BY MOUTH DAILY   cholecalciferol  (VITAMIN D ) 1000 UNITS tablet Take 1,000 Units by mouth every morning.    clopidogrel  (PLAVIX ) 75 MG tablet TAKE 1 TABLET(75 MG) BY MOUTH DAILY   DULoxetine  (CYMBALTA ) 60 MG capsule Take 1 capsule (60 mg total) by mouth daily.   Evolocumab  (REPATHA  SURECLICK) 140 MG/ML SOAJ INJECT 1 PEN UNDER THE SKIN EVERY 14 DAYS   famotidine  (PEPCID ) 40 MG tablet TAKE 1 TABLET(40 MG) BY MOUTH DAILY   FARXIGA  10 MG TABS tablet TAKE 1 TABLET(10 MG) BY MOUTH DAILY BEFORE BREAKFAST   glucose blood test strip Use as instructed   hydroxychloroquine  (PLAQUENIL ) 200 MG tablet Take 400 mg by mouth daily.    isosorbide  mononitrate (IMDUR ) 30 MG 24 hr tablet Take 3 tablets (90 mg total) by mouth daily.   Lancets (ONETOUCH ULTRASOFT) lancets Use as instructed   losartan  (COZAAR ) 25 MG tablet TAKE 1 TABLET(25 MG) BY MOUTH DAILY   nebivolol  (BYSTOLIC ) 10 MG tablet TAKE 1 TABLET(10 MG) BY MOUTH DAILY   nitroGLYCERIN  (NITROSTAT ) 0.4 MG SL  tablet Place 1 tablet (0.4 mg total) under the tongue every 5 (five) minutes as needed for chest pain.   Polyethyl Glycol-Propyl Glycol (SYSTANE) 0.4-0.3 % GEL ophthalmic gel Place 1  application into both eyes daily as needed (for dry eyes).    pregabalin  (LYRICA ) 75 MG capsule TAKE 1 CAPSULE(75 MG) BY MOUTH THREE TIMES DAILY   psyllium (METAMUCIL) 58.6 % powder Take 1 packet by mouth daily.   ranolazine  (RANEXA ) 500 MG 12 hr tablet TAKE 1 TABLET(500 MG) BY MOUTH TWICE DAILY   Rimegepant Sulfate (NURTEC) 75 MG TBDP Take 1 tab at onset of migraine.  May repeat in 2 hrs, if needed.  Max dose: 2 tabs/day. This is a 30 day prescription.   torsemide  (DEMADEX ) 20 MG tablet Take one tablet by mouth every third day.   Ubrogepant  (UBRELVY ) 100 MG TABS Take 100 mg by mouth if needed at onset of migraine. May repeat in 2 hours if headache returns or persists. Max 200 mg per 24 hours.   Current Facility-Administered Medications for the 09/06/23 encounter (Office Visit) with Okey Vina GAILS, MD  Medication   botulinum toxin Type A  (BOTOX ) injection 200 Units     Allergies:   Bactrim  [sulfamethoxazole -trimethoprim ], Cefuroxime axetil, Oxycodone , Pravastatin , Terfenadine, Zocor  [simvastatin ], Tramadol, Atorvastatin , Cefuroxime, Lactose intolerance (gi), Latex, Lime flavor [flavoring agent (non-screening)], Metformin  and related, Oxycodone  hcl, Pravastatin  sodium, Rosuvastatin , Rosuvastatin  calcium , Tape, and Wound dressing adhesive   Social History   Socioeconomic History   Marital status: Widowed    Spouse name: Aida   Number of children: 2   Years of education: masters   Highest education level: Not on file  Occupational History   Occupation: Retired, disable since 2000    Employer: RETIRED  Tobacco Use   Smoking status: Former    Current packs/day: 0.00    Average packs/day: 0.3 packs/day for 5.0 years (1.3 ttl pk-yrs)    Types: Cigarettes    Start date: 01/29/1993    Quit date: 01/29/1998    Years  since quitting: 25.6    Passive exposure: Past   Smokeless tobacco: Never   Tobacco comments:    Quit in 2001  Vaping Use   Vaping status: Never Used  Substance and Sexual Activity   Alcohol  use: No    Alcohol /week: 0.0 standard drinks of alcohol    Drug use: No   Sexual activity: Never  Other Topics Concern   Not on file  Social History Narrative   On disability since 2000--- also husband has MS   Education. College.   Right handed.      02/15/2022   Patient's daughter lives with her   Social Drivers of Corporate investment banker Strain: Low Risk  (05/03/2023)   Overall Financial Resource Strain (CARDIA)    Difficulty of Paying Living Expenses: Not hard at all  Food Insecurity: No Food Insecurity (05/03/2023)   Hunger Vital Sign    Worried About Running Out of Food in the Last Year: Never true    Ran Out of Food in the Last Year: Never true  Transportation Needs: No Transportation Needs (05/03/2023)   PRAPARE - Administrator, Civil Service (Medical): No    Lack of Transportation (Non-Medical): No  Physical Activity: Sufficiently Active (05/03/2023)   Exercise Vital Sign    Days of Exercise per Week: 5 days    Minutes of Exercise per Session: 30 min  Recent Concern: Physical Activity - Insufficiently Active (05/03/2023)   Exercise Vital Sign    Days of Exercise per Week: 3 days    Minutes of Exercise per Session: 20 min  Stress: No Stress Concern Present (05/03/2023)   [employer] of Occupational  Health - Occupational Stress Questionnaire    Feeling of Stress : Not at all  Social Connections: Moderately Isolated (05/03/2023)   Social Connection and Isolation Panel    Frequency of Communication with Friends and Family: More than three times a week    Frequency of Social Gatherings with Friends and Family: Never    Attends Religious Services: 1 to 4 times per year    Active Member of [employer] or Organizations: No    Attends Banker Meetings: Never     Marital Status: Widowed     Family History: The patient's family history includes Allergies in her sister; Aneurysm in her brother; Asthma in her paternal grandmother and sister; Diabetes in her brother and brother; Emphysema in her brother; Heart disease in her brother, father, mother, and another family member; Kidney failure in her brother; Lung cancer in her mother; Parkinsonism in her sister; Stroke in her brother, maternal grandmother, and paternal grandmother. There is no history of Breast cancer, Colon cancer, Heart attack, Esophageal cancer, Rectal cancer, or Stomach cancer.    EKGs/Labs/Other Studies Reviewed:    The following studies were reviewed today:  Echo  Nov 2024  1. Left ventricular ejection fraction, by estimation, is 50 to 55% . The left ventricle has low normal function. The left ventricle demonstrates regional wall motion abnormalities ( see scoring diagram/ findings for description) . Left ventricular diastolic parameters are indeterminate. There is mild hypokinesis of the left ventricular, entire anteroseptal wall and inferoseptal wall. 2. Right ventricular systolic function is normal. The right ventricular size is normal. 3. The mitral valve is normal in structure. Trivial mitral valve regurgitation. No evidence of mitral stenosis. 4. The aortic valve is tricuspid. There is mild calcification of the aortic valve. Aortic valve regurgitation is not visualized. Aortic valve sclerosis is present, with no evidence of aortic valve stenosis. 5. The inferior vena cava is normal in size with greater than 50% respiratory variability, suggesting right atrial pressure of 3 mmHg.  Echocardiogram 09/20/20 EF 65-70, no RWMA, mild LVH, normal RVSF, trivial MR, mild AV calcification, mild AV sclerosis without stenosis, borderline dilation of ascending aorta (38 mm)   Carotid US  09/16/20 Bilateral ICA 1-39    LONG TERM MONITOR (3-7 DAYS) INTERPRETATION 08/29/2020 Narrative  Patient had  a minimum heart rate of 53 bpm, maximum heart rate of 141 bpm, and average heart rate of 72 bpm.  Predominant underlying rhythm was sinus rhythm.  One run of supraventricular tachycardia occurred lasting 15 beats at longest with a max rate of 141 bpm at fastest.  Isolated PACs were rare (<1.0%)  Isolated PVCs were rare (<1.0%).  First degree heart block noted.  Triggered and diary events associated with sinus rhythm. No malignant arrhythmias.   GATED SPECT MYO PERF W/LEXISCAN  STRESS 1D 11/18/2019 Narrative 1. Normal study without ischemia or infarction. 2. Normal LVEF, 63%. 3. This is a low-risk study.   LEFT HEART CATH AND CORONARY ANGIOGRAPHY 03/22/2016 Narrative  Widely patent coronary arteries with 30-50% mid LAD narrowing and 40% mid RCA narrowing. Coronary arteries are tortuous.  Right dominant coronary anatomy. LAD wraps around the left ventricular apex.  Normal LV function. Normal LVEDP. EF 60%.  Suspect chest discomfort is not ischemic.   Carotid US  5/14:   < 39% bilateral ICA stenosis.    Holter monitor x 24 hours 07/2012:  NSR with PVCs.     EKG:  EKG shows SR    First degree AV block   Incomp RBBB  septal MI    Recent Labs: 07/17/2023: ALT 13; BUN 25; Creatinine, Ser 1.09; Hemoglobin 13.6; Platelets 161.0; Potassium 4.2; Pro B Natriuretic peptide (BNP) 37.0; Sodium 139  Recent Lipid Panel    Component Value Date/Time   CHOL 124 03/04/2023 1617   CHOL 135 10/01/2018 1036   TRIG 169.0 (H) 03/04/2023 1617   HDL 70.20 03/04/2023 1617   HDL 84 10/01/2018 1036   CHOLHDL 2 03/04/2023 1617   VLDL 33.8 03/04/2023 1617   LDLCALC 20 03/04/2023 1617   LDLCALC 34 10/01/2018 1036   LDLDIRECT 126.2 12/07/2010 1039      Physical Exam:    VS:  BP (!) 147/81 (BP Location: Left Arm, Patient Position: Sitting)   Pulse 65   Ht 5' 2 (1.575 m)   Wt 203 lb 6.4 oz (92.3 kg)   SpO2 100%   BMI 37.20 kg/m     Wt Readings from Last 3 Encounters:  09/06/23 203 lb  6.4 oz (92.3 kg)  08/28/23 203 lb 9.6 oz (92.4 kg)  08/27/23 201 lb (91.2 kg)     GEN:   Obese 82 yo in no acute distress  NECK: No JVD; No carotid bruits CARDIAC: RRR,  No murmurs  RESPIRATORY:  Clear to auscultation   Moving air    ABDOMEN: Soft, non-tender, no hepatomegaly   Ext  No LE edema     PLAN:     #CAD Cardiac cath 2018 with 40% mid RCA 50% mid LAD treated medically.   Stress test 10/2019 with no evidence of ischemia or infarction.  Current chest discomfot does not sound ischemic     Keep on current medical Rx of  -Continue plavix  75mg  daily -Continue losartan  25mg  daily -Continue imdur  90mg  daily -Continue bystolic  10mg  daily -Continue repatha  140mg  q2 weeks -Continue ranexa  500mg  BID  #Chest discomfort  Pain/discomfort related to eating   ? Stricture  Will set up for a barium swallow   #Palpitations: Pt denies   #  Hx of HFpEF    Volume status is good . -Continue torsemide  20mg  every other day -Continue losartan  25mg  daily -Continue farxiga  10mg  daily -Low Na diet  #HTN: BP is a little high   She says at home it is in the 110s   She says her BP cuff has been checked in past and is accurate  -Continue imdur  90mg  daily -Continue bystolic  10mg  daily -Continue losartan  25mg  daily (did not tolerate higher dose due to hypotension) -Low Na diet  #HLD: -Continue repatha  140mg  q2 weeks -LDL very well controlled   at 20    #History of TIA: -Continue plavix  75mg  daily -Continue repatha  as above  # Back pain Will refer to [facility] in Sports Medicine   Medication Adjustments/Labs and Tests Ordered: Current medicines are reviewed at length with the patient today.  Concerns regarding medicines are outlined above.  Orders Placed This Encounter  Procedures   DG ESOPHAGUS W SINGLE CM (SOL OR THIN BA)   AMB referral to sports medicine   No orders of the defined types were placed in this encounter.   Patient Instructions  Medication Instructions:   Your physician recommends that you continue on your current medications as directed. Please refer to the Current Medication list given to you today.  *If you need a refill on your cardiac medications before your next appointment, please call your pharmacy*  Testing/Procedures: Your physician has requested that you have a barium swallow test completed. A barium swallow test (cine esophagram,  swallowing study, esophagography, modified barium swallow study, video fluoroscopy swallow study) is a special type of imaging test that uses barium and X-rays to create images of your upper gastrointestinal (GI) tract. Your upper GI tract includes the back of your mouth and throat (pharynx) and your esophagus.  Barium is used during a swallowing test to make certain areas of the body show up more clearly on an X-ray. The radiologist will be able to see size and shape of the pharynx and esophagus. He or she will also be able see how you swallow. These details might not be seen on a standard X-ray. Barium is used only for imaging tests for the GI tract.  You will need to stop eating and drinking for about 8 hours before the swallowing test. Generally, this means after midnight.  Follow-Up: At [facility], you and your health needs are our priority.  As part of our continuing mission to provide you with exceptional heart care, our providers are all part of one team.  This team includes your primary Cardiologist (physician) and Advanced Practice Providers or APPs (Physician Assistants and Nurse Practitioners) who all work together to provide you with the care you need, when you need it.  Your next appointment:   5 month(s)  Provider:   Vina Gull, MD   We recommend signing up for the patient portal called MyChart.  Sign up information is provided on this After Visit Summary.  MyChart is used to connect with patients for Virtual Visits (Telemedicine).  Patients are able to view lab/test results,  encounter notes, upcoming appointments, etc.  Non-urgent messages can be sent to your provider as well.    To learn more about what you can do with MyChart, go to [email or web address].   Other Instructions You have been referred to Dr. Arthea Sharps at [facility]. Their office will call you to schedule an appointment.     Signed, Vina Gull, MD  [facility]

## 2023-09-06 ENCOUNTER — Encounter: Payer: Self-pay | Admitting: Internal Medicine

## 2023-09-06 ENCOUNTER — Ambulatory Visit: Attending: Internal Medicine | Admitting: Internal Medicine

## 2023-09-06 VITALS — BP 147/81 | HR 65 | Ht 62.0 in | Wt 203.4 lb

## 2023-09-06 DIAGNOSIS — M549 Dorsalgia, unspecified: Secondary | ICD-10-CM | POA: Insufficient documentation

## 2023-09-06 DIAGNOSIS — R1310 Dysphagia, unspecified: Secondary | ICD-10-CM | POA: Diagnosis not present

## 2023-09-06 NOTE — Patient Instructions (Addendum)
 Medication Instructions:  Your physician recommends that you continue on your current medications as directed. Please refer to the Current Medication list given to you today.  *If you need a refill on your cardiac medications before your next appointment, please call your pharmacy*  Testing/Procedures: Your physician has requested that you have a barium swallow test completed. A barium swallow test (cine esophagram, swallowing study, esophagography, modified barium swallow study, video fluoroscopy swallow study) is a special type of imaging test that uses barium and X-rays to create images of your upper gastrointestinal (GI) tract. Your upper GI tract includes the back of your mouth and throat (pharynx) and your esophagus.  Barium is used during a swallowing test to make certain areas of the body show up more clearly on an X-ray. The radiologist will be able to see size and shape of the pharynx and esophagus. He or she will also be able see how you swallow. These details might not be seen on a standard X-ray. Barium is used only for imaging tests for the GI tract.  You will need to stop eating and drinking for about 8 hours before the swallowing test. Generally, this means after midnight.  Follow-Up: At [facility], you and your health needs are our priority.  As part of our continuing mission to provide you with exceptional heart care, our providers are all part of one team.  This team includes your primary Cardiologist (physician) and Advanced Practice Providers or APPs (Physician Assistants and Nurse Practitioners) who all work together to provide you with the care you need, when you need it.  Your next appointment:   5 month(s)  Provider:   Vina Gull, MD   We recommend signing up for the patient portal called MyChart.  Sign up information is provided on this After Visit Summary.  MyChart is used to connect with patients for Virtual Visits (Telemedicine).  Patients are able to  view lab/test results, encounter notes, upcoming appointments, etc.  Non-urgent messages can be sent to your provider as well.    To learn more about what you can do with MyChart, go to [email or web address].   Other Instructions You have been referred to Dr. Arthea Sharps at [facility]. Their office will call you to schedule an appointment.

## 2023-09-13 ENCOUNTER — Other Ambulatory Visit: Payer: Self-pay | Admitting: Internal Medicine

## 2023-09-16 ENCOUNTER — Telehealth: Payer: Self-pay | Admitting: Neurology

## 2023-09-16 ENCOUNTER — Other Ambulatory Visit: Payer: Self-pay

## 2023-09-16 MED ORDER — CELECOXIB 100 MG PO CAPS: 100.0000 mg | ORAL_CAPSULE | Freq: Every day | ORAL | 2 refills | Status: AC | PRN

## 2023-09-16 NOTE — Telephone Encounter (Signed)
 Pt is calling in regards to thinking she might be having a reaction to last botulinum toxin Type A  (BOTOX ) injection 200 Units  injection   Pt states that her right eye is getting smaller by the day and Pt is not sure what's going on the patient states that the right side of face is in pain the patient would like  a call to discuss other Treatment methods

## 2023-09-16 NOTE — Telephone Encounter (Signed)
 Call to patient, no answer. Left message to return call

## 2023-09-16 NOTE — Telephone Encounter (Signed)
 Patient states that she having some drooping around her right eye since the  injection last week. I explained that that can be a side effect of the injections and it will wear off but will take time. I confirmed and patient reported no other stroke symptoms and drooping started after injection. Advised I would send to Dr. Onita and follow back up with recommendations. Patient appreciative.

## 2023-09-25 ENCOUNTER — Other Ambulatory Visit: Payer: Self-pay | Admitting: Internal Medicine

## 2023-10-01 DIAGNOSIS — H04123 Dry eye syndrome of bilateral lacrimal glands: Secondary | ICD-10-CM | POA: Diagnosis not present

## 2023-10-01 DIAGNOSIS — Z961 Presence of intraocular lens: Secondary | ICD-10-CM | POA: Diagnosis not present

## 2023-10-01 DIAGNOSIS — H5711 Ocular pain, right eye: Secondary | ICD-10-CM | POA: Diagnosis not present

## 2023-10-06 ENCOUNTER — Other Ambulatory Visit: Payer: Self-pay | Admitting: Internal Medicine

## 2023-10-22 ENCOUNTER — Other Ambulatory Visit: Payer: Self-pay | Admitting: Internal Medicine

## 2023-11-11 ENCOUNTER — Telehealth: Payer: Self-pay | Admitting: Neurology

## 2023-11-11 NOTE — Telephone Encounter (Signed)
 Patient called verify appointment due to having severe migraines. Patient said taking Nurtec, migraines get so bad can not even walk.

## 2023-11-11 NOTE — Telephone Encounter (Signed)
 Unable to lmtrc 11/11/23 by hf

## 2023-11-12 NOTE — Telephone Encounter (Signed)
 Unable to lvm 1st attempt by hf 11/12/23

## 2023-11-13 NOTE — Telephone Encounter (Signed)
 Pt has returned call to CMA, please call pt

## 2023-11-14 NOTE — Telephone Encounter (Signed)
 Please advise patient to go to the ED or Urgent care to be evaluated.

## 2023-11-14 NOTE — Telephone Encounter (Signed)
 Pt called in stating that she believes that the weather is causing her migraines to be worse. Pt states she has had the same migraine for 1 week, going on 2 weeks.Pt states that every time she takes a step her head and her neck will hurt. Pt states that the Nurtec isn't as effective as it used to be. Pt states that she has some shaking and tremors in her left arm since her migraine started 1 week ago. Pt states that she is having light and sound sensitivity with her migraine. Pt denies any vision changes with her migraine. Pt states that she has had some stumbles since having her migraine. Pt denies having any falls.   Sending to [facility]

## 2023-11-14 NOTE — Telephone Encounter (Signed)
 Called pt back and told her per WID to go to either the ED or Urgent Care.

## 2023-11-14 NOTE — Telephone Encounter (Addendum)
 Called pt at [phone]. Mailbox full, unable to LVM

## 2023-11-22 ENCOUNTER — Encounter (HOSPITAL_COMMUNITY): Payer: Self-pay

## 2023-11-22 ENCOUNTER — Ambulatory Visit (HOSPITAL_COMMUNITY)

## 2023-11-22 ENCOUNTER — Ambulatory Visit (HOSPITAL_COMMUNITY)
Admission: EM | Admit: 2023-11-22 | Discharge: 2023-11-22 | Disposition: A | Discharge status: Home / Self Care | Attending: Family Medicine | Admitting: Family Medicine

## 2023-11-22 DIAGNOSIS — R051 Acute cough: Secondary | ICD-10-CM

## 2023-11-22 DIAGNOSIS — I517 Cardiomegaly: Secondary | ICD-10-CM | POA: Diagnosis not present

## 2023-11-22 DIAGNOSIS — R0602 Shortness of breath: Secondary | ICD-10-CM

## 2023-11-22 DIAGNOSIS — I700 Atherosclerosis of aorta: Secondary | ICD-10-CM | POA: Diagnosis not present

## 2023-11-22 DIAGNOSIS — J4521 Mild intermittent asthma with (acute) exacerbation: Secondary | ICD-10-CM

## 2023-11-22 DIAGNOSIS — J40 Bronchitis, not specified as acute or chronic: Secondary | ICD-10-CM | POA: Diagnosis not present

## 2023-11-22 LAB — POC COVID19/FLU A&B COMBO
Covid Antigen, POC: NEGATIVE
Influenza A Antigen, POC: NEGATIVE
Influenza B Antigen, POC: NEGATIVE

## 2023-11-22 MED ORDER — PREDNISONE 10 MG PO TABS: 40.0000 mg | ORAL_TABLET | Freq: Every day | ORAL | 0 refills | Status: AC

## 2023-11-22 MED ORDER — PREDNISONE 10 MG PO TABS: 40.0000 mg | ORAL_TABLET | Freq: Every day | ORAL | 0 refills | Status: DC

## 2023-11-22 NOTE — ED Triage Notes (Signed)
 Patient reports that she has had a productive cough with yellow/green sputum, SOB, bilateral rib cage pain, chest pain, that is worse with a cough,headache, and SOB with wheezing.  Patient states she has not been taking any OTC meds for her symptoms. Patient states she has been taking Botox  for her headache/migraine.

## 2023-11-22 NOTE — Discharge Instructions (Signed)
-   Take 40 mg prednisone  with breakfast for 5 days - Please use your Symbicort  inhaler twice daily as prescribed - If symptoms worsen or fail to improve, I do recommend follow-up with your PCP however if you have significant short of breath I recommend going to the emergency room

## 2023-11-22 NOTE — ED Provider Notes (Signed)
 [facility]    CSN: [id number] Arrival date & time: 11/22/23  1321      History   Chief Complaint Chief Complaint  Patient presents with   Shortness of Breath   Cough   Headache   rib cage pain   Chest Pain    HPI Samantha [name] is a 83 y.o. female.    Shortness of Breath Associated symptoms: chest pain, cough and headaches   Cough Associated symptoms: chest pain, headaches and shortness of breath   Headache Associated symptoms: cough   Chest Pain Associated symptoms: cough, headache and shortness of breath     83 year old female with pertinent medical history of asthma, congestive heart failure hypertension, GERD and coronary artery disease presenting with productive cough, shortness of breath, bilateral rib cage pain that is worse with cough and reported wheezing.  Reports only using Symbicort  as needed, has not taken today.  Taking Tylenol , last dose was last night.  Additionally having myalgias and arthralgias, with chills.  Denies new swelling.  No current chest pain.  Chest pain worse with coughing, lasting variable amounts of time.   Past Medical History:  Diagnosis Date   A-fib [facility])    Abnormal CT of the chest 2008   last CT4-l 2009:  . No f/u suggested    Allergy    Asthma    CAD (coronary artery disease)    a. Coronary CTA 10/16: Coronary Ca score 211, mod non-obstructive CAD with LM mild plaque (25-50%), mid LAD 50-69%. b. Neg nuc 06/2015.   Cataract    BILATERAL-REMOVED   Chronic diastolic CHF (congestive heart failure) (HCC)    Collagen vascular disease    arterial sclerosis per pt   Complication of anesthesia    trouble waking up   Fibromyalgia    GERD (gastroesophageal reflux disease)    H/O hiatal hernia    Hearing loss    left ear   Heart murmur    Hyperlipidemia    Hypertension    Kidney disease 04/04/2022   Malignant neoplasm of ascending colon (HCC) 2016   Minimally invasive right hemicolectomy to be done     Neuromuscular disorder (HCC)    FIBROMYALGIA   Ocular migraine    OSA (obstructive sleep apnea) 09/2007   dx w/ a sleep study, not on  CPAP   Osteoarthritis    Osteoporosis    Pneumonia    double in 2004   PONV (postoperative nausea and vomiting)    Reactive airway disease 01/29/2002   dx of pseudoasthma / vcd in 2005 and nl sprirometry History of dyspnea, 2011,  improved after several medications were changed around Question of COPD, disproved July 06, 2009 with nl pft's       Rheumatoid factor positive    Shingles 11/2009   Sleep apnea    Stroke (HCC)    TIA (transient ischemic attack)    x2 - on Plavix  for this   Torn rotator cuff    right worse than left, both are torn   Tumor, 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   Type II diabetes mellitus (HCC)    Vaginal cancer (HCC) 1994   Vaginal dysplasia     Patient Active Problem List   Diagnosis Date Noted   Fall 08/28/2023   Right foot drop 08/27/2023   Muscle pain 07/17/2023   Venous insufficiency of both lower extremities 02/27/2023   Rib pain on right side 02/27/2023   Allergic rhinitis  05/21/2022   Pain of right hip 04/04/2022   Female bladder prolapse 04/04/2022   Tinnitus of left ear 02/14/2022   Dysplasia of vagina 01/10/2022   Neck pain 04/17/2021   Intractable headache 04/17/2021   Chronic migraine w/o aura w/o status migrainosus, not intractable 04/17/2021   Urinary frequency 02/21/2021   CKD (chronic kidney disease) stage 3, GFR 30-59 ml/min (HCC) 02/20/2021   Spinal stenosis of cervical region 02/16/2021   Rheumatoid arthritis (HCC) - Dr Leni 10/20/2020   Neuropathy 10/19/2020   Vitamin D  deficiency 04/13/2020   S/P TKR (total knee replacement), 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   Aortic atherosclerosis 11/03/2019   Posterior vitreous detachment of right eye 07/15/2019   Posterior vitreous detachment of left eye 07/15/2019   Recurrent corneal erosion, 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   Raynaud phenomenon 05/06/2019   Trigger  finger, left ring finger 03/18/2019   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 01/28/2018   Midline low back pain without sciatica 11/10/2017   GERD (gastroesophageal reflux disease) 10/23/2017   Sacral back pain 10/16/2017   Epistaxis 07/23/2017   Chronic left upper quadrant pain 07/23/2017   Poor balance 03/14/2017   Essential hypertension 07/07/2015   SOB (shortness of breath) 07/07/2015   Lightheadedness 07/07/2015   Plantar fasciitis, left 03/22/2015   Coronary artery disease due to lipid rich plaque 01/05/2015   Chronic diastolic CHF (congestive heart failure), NYHA class 2 (HCC) 01/05/2015   Malignant neoplasm of ascending colon  pT1, pN0, rM0 s/p robotic colectomy 11/11/2014 11/11/2014   VBI (vertebrobasilar insufficiency) 08/22/2012   TIA (transient ischemic attack) 06/27/2012   DJD (degenerative joint disease) 02/02/2011   Palpitations 11/23/2008   Fibromyalgia 08/15/2007   DM II (diabetes mellitus, type II), w/ neuropathy 05/21/2006   Hyperlipidemia 05/21/2006   Asthma 01/29/2002    Past Surgical History:  Procedure Laterality Date   ABDOMINAL HYSTERECTOMY  1980   NO oophorectomy per pt    ANTERIOR CERVICAL DECOMP/DISCECTOMY FUSION  2001   C 3, C4 and C5 plate and screws   BREAST BIOPSY Right 1999   BUNIONECTOMY Left ~ 1977   CARDIAC CATHETERIZATION     2018 By Dr. Esmeralda Sharps (done after colon surgery)   CATARACT EXTRACTION W/ INTRAOCULAR LENS  IMPLANT, BILATERAL  2012   COLON SURGERY  10/2014   EYE SURGERY Bilateral    torq lens for cataracts   LEFT HEART CATH AND CORONARY ANGIOGRAPHY N/A 03/22/2016   Procedure: Left Heart Cath and Coronary Angiography;  Surgeon: Victory LELON Sharps, MD;  Location: [facility] INVASIVE CV LAB;  Service: Cardiovascular;  Laterality: N/A;   LUMBAR LAMINECTOMY/DECOMPRESSION MICRODISCECTOMY N/A 01/20/2018   Procedure: L4-5 decompression;  Surgeon: Barbarann Oneil BROCKS, MD;  Location: [facility] OR;  Service: Orthopedics;   Laterality: N/A;   THYROIDECTOMY, PARTIAL  1960's   TOTAL KNEE ARTHROPLASTY Right 12/04/2019   Procedure: RIGHT TOTAL KNEE ARTHROPLASTY;  Surgeon: Barbarann Oneil BROCKS, MD;  Location: [facility] OR;  Service: Orthopedics;  Laterality: Right;  RNFA APRIL PLEASE   VAGINAL MASS EXCISION  1994   Laser surgery for vaginal cancer; followed by chemotherapy (06/27/2012)    OB History     Gravida  2   Para      Term      Preterm      AB      Living         SAB      IAB      Ectopic      Multiple  Live Births               Home Medications    Prior to Admission medications   Medication Sig Start Date End Date Taking? Authorizing Provider  acetaminophen  (TYLENOL ) 500 MG tablet Take 500 mg by mouth every 6 (six) hours as needed for mild pain.     [provider]  albuterol  (PROVENTIL ) (2.5 MG/3ML) 0.083% nebulizer solution Take 3 mLs (2.5 mg total) by nebulization every 6 (six) hours as needed for wheezing or shortness of breath. 10/29/22   Geofm Glade PARAS, MD  albuterol  (VENTOLIN  HFA) 108 (90 Base) MCG/ACT inhaler Inhale 2 puffs into the lungs every 6 (six) hours as needed for wheezing or shortness of breath. 08/27/23   Burns, Glade PARAS, MD  ALPRAZolam  (XANAX ) 0.5 MG tablet Take 1 tablet (0.5 mg total) by mouth 2 (two) times daily as needed for anxiety. 05/09/23   Onita Duos, MD  budesonide -formoterol  (SYMBICORT ) 160-4.5 MCG/ACT inhaler INHALE 2 PUFFS INTO THE LUNGS TWICE DAILY 10/22/23   Geofm Glade PARAS, MD  Calcium -Magnesium-Zinc (CAL-MAG-ZINC PO) Take 1 tablet by mouth daily.    [provider]  celecoxib  (CELEBREX ) 100 MG capsule Take 1 capsule (100 mg total) by mouth daily as needed. 09/16/23   Onita Duos, MD  cetirizine  (ZYRTEC ) 10 MG tablet TAKE 1 TABLET(10 MG) BY MOUTH DAILY 02/27/23   Geofm Glade PARAS, MD  cholecalciferol  (VITAMIN D ) 1000 UNITS tablet Take 1,000 Units by mouth every morning.     [provider]  clopidogrel  (PLAVIX ) 75 MG tablet TAKE 1 TABLET(75  MG) BY MOUTH DAILY 10/07/23   Burns, Glade PARAS, MD  dapagliflozin  propanediol (FARXIGA ) 10 MG TABS tablet TAKE 1 TABLET(10 MG) BY MOUTH DAILY BEFORE BREAKFAST 10/22/23   Burns, Glade PARAS, MD  DULoxetine  (CYMBALTA ) 60 MG capsule Take 1 capsule (60 mg total) by mouth daily. 05/09/23   Onita Duos, MD  Evolocumab  (REPATHA  SURECLICK) 140 MG/ML SOAJ INJECT 1 PEN UNDER THE SKIN EVERY 14 DAYS 08/12/23   Okey Vina GAILS, MD  famotidine  (PEPCID ) 40 MG tablet TAKE 1 TABLET(40 MG) BY MOUTH DAILY 02/25/23   Geofm Glade PARAS, MD  FARXIGA  10 MG TABS tablet TAKE 1 TABLET(10 MG) BY MOUTH DAILY BEFORE BREAKFAST 10/22/23   Geofm Glade PARAS, MD  glucose blood test strip Use as instructed 03/29/20   Geofm Glade PARAS, MD  hydroxychloroquine  (PLAQUENIL ) 200 MG tablet Take 400 mg by mouth daily.     [provider]  isosorbide  mononitrate (IMDUR ) 30 MG 24 hr tablet Take 3 tablets (90 mg total) by mouth daily. 07/10/23   Okey Vina GAILS, MD  Lancets [facility] ULTRASOFT) lancets Use as instructed 03/29/20   Geofm Glade PARAS, MD  losartan  (COZAAR ) 25 MG tablet TAKE 1 TABLET(25 MG) BY MOUTH DAILY 01/21/23   Lelon Hamilton T, PA-C  nebivolol  (BYSTOLIC ) 10 MG tablet TAKE 1 TABLET(10 MG) BY MOUTH DAILY 04/24/23   Okey Vina GAILS, MD  nitroGLYCERIN  (NITROSTAT ) 0.4 MG SL tablet Place 1 tablet (0.4 mg total) under the tongue every 5 (five) minutes as needed for chest pain. 12/14/22   Okey Vina GAILS, MD  Polyethyl Glycol-Propyl Glycol (SYSTANE) 0.4-0.3 % GEL ophthalmic gel Place 1 application into both eyes daily as needed (for dry eyes).     [provider]  predniSONE  (DELTASONE ) 10 MG tablet Take 4 tablets (40 mg total) by mouth daily. 11/22/23  Yes Howell Lunger, DO  pregabalin  (LYRICA ) 75 MG capsule TAKE 1 CAPSULE(75 MG) BY MOUTH  THREE TIMES DAILY 10/23/23   Geofm Glade PARAS, MD  psyllium (METAMUCIL) 58.6 % powder Take 1 packet by mouth daily.    [provider]  ranolazine  (RANEXA ) 500 MG 12 hr tablet TAKE 1 TABLET(500 MG) BY MOUTH  TWICE DAILY 01/01/23   Okey Vina GAILS, MD  Rimegepant Sulfate (NURTEC) 75 MG TBDP Take 1 tab at onset of migraine.  May repeat in 2 hrs, if needed.  Max dose: 2 tabs/day. This is a 30 day prescription. 05/09/23   Onita Duos, MD  torsemide  (DEMADEX ) 20 MG tablet Take one tablet by mouth every third day. 02/19/23   Okey Vina GAILS, MD  Ubrogepant  (UBRELVY ) 100 MG TABS Take 100 mg by mouth if needed at onset of migraine. May repeat in 2 hours if headache returns or persists. Max 200 mg per 24 hours. Patient not taking: Reported on 11/22/2023 02/27/23   Sherryl Bouchard, NP    Family History Family History  Problem Relation Age of Onset   Heart disease Father    Heart disease Mother    Lung cancer Mother    Allergies Sister    Parkinsonism Sister        possible   Asthma Sister    Asthma Paternal Grandmother    Stroke Paternal Grandmother    Heart disease Other        paternal grandparents, maternal grandparents,    Heart disease Brother    Emphysema Brother    Aneurysm Brother        x3   Kidney failure Brother    Diabetes Brother    Diabetes Brother    Stroke Brother    Stroke Maternal Grandmother    Breast cancer Neg Hx    Colon cancer Neg Hx    Heart attack Neg Hx    Esophageal cancer Neg Hx    Rectal cancer Neg Hx    Stomach cancer Neg Hx     Social History Social History   Tobacco Use   Smoking status: Former    Current packs/day: 0.00    Average packs/day: 0.3 packs/day for 5.0 years (1.3 ttl pk-yrs)    Types: Cigarettes    Start date: 01/29/1993    Quit date: 01/29/1998    Years since quitting: 25.8    Passive exposure: Past   Smokeless tobacco: Never   Tobacco comments:    Quit in 2001  Vaping Use   Vaping status: Never Used  Substance Use Topics   Alcohol  use: No    Alcohol /week: 0.0 standard drinks of alcohol    Drug use: No     Allergies   Bactrim  [sulfamethoxazole -trimethoprim ], Cefuroxime axetil, Oxycodone , Pravastatin , Terfenadine, Zocor  [simvastatin ],  Tramadol, Atorvastatin , Cefuroxime, Lactose intolerance (gi), Latex, Lime flavor [flavoring agent (non-screening)], Metformin  and related, Oxycodone  hcl, Pravastatin  sodium, Rosuvastatin , Rosuvastatin  calcium , Tape, and Wound dressing adhesive   Review of Systems Review of Systems  Respiratory:  Positive for cough and shortness of breath.   Cardiovascular:  Positive for chest pain.  Neurological:  Positive for headaches.     Physical Exam Triage Vital Signs ED Triage Vitals [11/22/23 1448]  Encounter Vitals Group     BP      Girls Systolic BP Percentile      Girls Diastolic BP Percentile      Boys Systolic BP Percentile      Boys Diastolic BP Percentile      Pulse      Resp      Temp  Temp src      SpO2      Weight      Height      Head Circumference      Peak Flow      Pain Score 7     Pain Loc      Pain Education      Exclude from Growth Chart    No data found.  Updated Vital Signs BP 112/70 (BP Location: Right Arm)   Pulse 69   Temp 98.2 F (36.8 C) (Oral)   Resp 16   SpO2 91%   Visual Acuity Right Eye Distance:   Left Eye Distance:   Bilateral Distance:    Right Eye Near:   Left Eye Near:    Bilateral Near:     Physical Exam Constitutional:      General: She is not in acute distress.    Appearance: She is ill-appearing. She is not toxic-appearing.  HENT:     Head: Normocephalic and atraumatic.     Mouth/Throat:     Pharynx: Posterior oropharyngeal erythema present. No pharyngeal swelling or oropharyngeal exudate.     Tonsils: No tonsillar exudate.  Eyes:     Extraocular Movements: Extraocular movements intact.     Pupils: Pupils are equal, round, and reactive to light.  Cardiovascular:     Rate and Rhythm: Normal rate and regular rhythm.  Pulmonary:     Breath sounds: Normal breath sounds. No decreased breath sounds, wheezing or rhonchi.  Chest:     Chest wall: No tenderness.  Skin:    General: Skin is warm and dry.  Neurological:      Mental Status: She is alert.    UC Treatments / Results  Labs (all labs ordered are listed, but only abnormal results are displayed) Labs Reviewed  POC COVID19/FLU A&B COMBO    EKG   Radiology No results found.  Procedures Procedures (including critical care time)  Medications Ordered in UC Medications - No data to display  Initial Impression / Assessment and Plan / UC Course  I have reviewed the triage vital signs and the nursing notes.  Pertinent labs & imaging results that were available during my care of the patient were reviewed by me and considered in my medical decision making (see chart for details).     Afebrile, ill-appearing but nontoxic 83 year old female with pertinent medical history of chronic lung disease presenting with viral URI symptoms and shortness of breath-SpO2 of 91%.  No wheezing or focal findings on auscultation. Obtained chest x-ray, and swab for COVID/flu. With regard to chest pain, she is not currently having chest pain-chest pain primarily with coughing.  Reviewed strict ED precautions with chest pain.  Independently reviewed chest x-ray, my interpretation: No acute cardiopulmonary disease. Pending radiology read.  COVID/flu swab negative.  Patient remains stable on repeat exam. Given asthma history, will prescribe steroid burst and recommend she restart taking her Symbicort  twice daily as prescribed, with albuterol  as needed.  Differential remains viral URI versus asthma exacerbation. Discussed follow-up and return precautions.   Final Clinical Impressions(s) / UC Diagnoses   Final diagnoses:  Acute cough  Shortness of breath     Discharge Instructions      - Take 40 mg prednisone  with breakfast for 5 days - Please use your Symbicort  inhaler twice daily as prescribed - If symptoms worsen or fail to improve, I do recommend follow-up with your PCP however if you have significant short of breath I recommend going  to the emergency  room     ED Prescriptions     Medication Sig Dispense Auth. Provider   predniSONE  (DELTASONE ) 10 MG tablet Take 4 tablets (40 mg total) by mouth daily. 15 tablet Howell Lunger, DO      PDMP not reviewed this encounter.   Howell Lunger, [place] 11/22/23 1554

## 2023-11-26 ENCOUNTER — Encounter: Payer: Self-pay | Admitting: Internal Medicine

## 2023-11-26 ENCOUNTER — Ambulatory Visit (INDEPENDENT_AMBULATORY_CARE_PROVIDER_SITE_OTHER): Admitting: Internal Medicine

## 2023-11-26 ENCOUNTER — Telehealth (HOSPITAL_COMMUNITY): Payer: Self-pay | Admitting: *Deleted

## 2023-11-26 VITALS — BP 116/78 | HR 68 | Temp 99.1°F | Ht 62.0 in | Wt 201.0 lb

## 2023-11-26 DIAGNOSIS — J4541 Moderate persistent asthma with (acute) exacerbation: Secondary | ICD-10-CM

## 2023-11-26 DIAGNOSIS — J069 Acute upper respiratory infection, unspecified: Secondary | ICD-10-CM

## 2023-11-26 DIAGNOSIS — I10 Essential (primary) hypertension: Secondary | ICD-10-CM

## 2023-11-26 DIAGNOSIS — J45901 Unspecified asthma with (acute) exacerbation: Secondary | ICD-10-CM | POA: Insufficient documentation

## 2023-11-26 MED ORDER — PREGABALIN 75 MG PO CAPS: ORAL_CAPSULE | ORAL | 2 refills | Status: AC

## 2023-11-26 MED ORDER — BUDESONIDE-FORMOTEROL FUMARATE 160-4.5 MCG/ACT IN AERO: 2.0000 | INHALATION_SPRAY | Freq: Two times a day (BID) | RESPIRATORY_TRACT | 3 refills | Status: AC

## 2023-11-26 MED ORDER — ALBUTEROL SULFATE HFA 108 (90 BASE) MCG/ACT IN AERS: 2.0000 | INHALATION_SPRAY | Freq: Four times a day (QID) | RESPIRATORY_TRACT | 11 refills | Status: AC | PRN

## 2023-11-26 NOTE — Assessment & Plan Note (Signed)
 Chronic Blood pressure well-controlled Continue Imdur  90 mg daily, losartan  25 mg daily and Bystolic  10 mg daily

## 2023-11-26 NOTE — Telephone Encounter (Signed)
 Called rx for prednisone  to pharmacy, pt aware

## 2023-11-26 NOTE — Assessment & Plan Note (Signed)
 Acute exacerbation secondary to URI Moderate, persistent in nature Continue Symbicort  inhaler twice daily, albuterol  inhaler as needed Just started prednisone -complete course Will hold off on an antibiotic at this time-discussed that it is very difficult to know if this is viral or bacterial in nature If no improvement over the next few days she will let me know and I will send in an antibiotic Increase rest, fluids Continue over-the-counter cold medications for symptom relief

## 2023-11-26 NOTE — Progress Notes (Signed)
 Subjective:    Patient ID: Samantha [name], female    DOB: 1940-12-28, 83 y.o.   MRN: [id number]      HPI Samantha [name] is here for  Chief Complaint  Patient presents with   Medical Management of Chronic Issues    UC follow up ; Fall on Sunday   Fri UC - laryngitis, URI -- rx prednisone  -- got it yesterdya.  Covid, flu neg.  Cxr w/o pna -mild bronchitic changes  Discussed the use of AI scribe software for clinical note transcription with the patient, who gave verbal consent to proceed.  History of Present Illness Samantha [name] is an 83 year old female who presents with upper respiratory symptoms.  She is experiencing significant upper respiratory symptoms, including drainage and congestion, which she has been coughing up. The congestion is descending from her sinuses. She visited urgent care and was diagnosed with URI, for which prednisone  was prescribed. COVID and flu tests were negative.  A CXR was negative for PNA.  Due to a pharmacy error, she started the medication late and has not yet observed its effects. She continues to experience hoarseness and has completely lost her voice.  She reports a slight, low-grade fever and feels warm compared to others. She experiences shortness of breath and significant wheezing, for which she uses Symbicort  and Ventolin  twice daily. Nasal congestion is present but less severe, and she is able to bring up mucus. She experienced ear pain for about an hour and had a sore throat for one or two days, managed with warm salt water  gargles and warm teas.  No sinus pain or pressure, but she reports headaches, which she attributes to her neck. Occasional dizziness or lightheadedness is noted. Body aches, associated with a fibromyalgia attack, have resolved. She denies current pain but mentions a history of back pain following a fall in May and another fall on Saturday night, resulting in shoulder impact but no lasting pain.  She experienced diarrhea  without nausea, which has resolved.   She is currently taking cetirizine  for allergies, which she believes helps with drainage, and Delsym for cough relief. She has been around her daughter, who was recently hospitalized, and suspects this may be related to her illness. She describes a dull, aching pain in her chest.  Her appetite is okay, though she does not feel as hungry as she should. She tries to maintain a nutritious diet, including fruit, shredded wheat cereal, oatmeal, eggs, and bacon.     Medications and allergies reviewed with patient and updated if appropriate.  Current Outpatient Medications on File Prior to Visit  Medication Sig Dispense Refill   acetaminophen  (TYLENOL ) 500 MG tablet Take 500 mg by mouth every 6 (six) hours as needed for mild pain.      albuterol  (PROVENTIL ) (2.5 MG/3ML) 0.083% nebulizer solution Take 3 mLs (2.5 mg total) by nebulization every 6 (six) hours as needed for wheezing or shortness of breath. 150 mL 0   albuterol  (VENTOLIN  HFA) 108 (90 Base) MCG/ACT inhaler Inhale 2 puffs into the lungs every 6 (six) hours as needed for wheezing or shortness of breath. 18 g 11   ALPRAZolam  (XANAX ) 0.5 MG tablet Take 1 tablet (0.5 mg total) by mouth 2 (two) times daily as needed for anxiety. 30 tablet 0   budesonide -formoterol  (SYMBICORT ) 160-4.5 MCG/ACT inhaler INHALE 2 PUFFS INTO THE LUNGS TWICE DAILY 10.2 g 3   Calcium -Magnesium-Zinc (CAL-MAG-ZINC PO) Take 1 tablet by mouth daily.  celecoxib  (CELEBREX ) 100 MG capsule Take 1 capsule (100 mg total) by mouth daily as needed. 60 capsule 2   cetirizine  (ZYRTEC ) 10 MG tablet TAKE 1 TABLET(10 MG) BY MOUTH DAILY 90 tablet 3   cholecalciferol  (VITAMIN D ) 1000 UNITS tablet Take 1,000 Units by mouth every morning.      clopidogrel  (PLAVIX ) 75 MG tablet TAKE 1 TABLET(75 MG) BY MOUTH DAILY 90 tablet 1   dapagliflozin  propanediol (FARXIGA ) 10 MG TABS tablet TAKE 1 TABLET(10 MG) BY MOUTH DAILY BEFORE BREAKFAST 90 tablet 1    DULoxetine  (CYMBALTA ) 60 MG capsule Take 1 capsule (60 mg total) by mouth daily. 90 capsule 3   Evolocumab  (REPATHA  SURECLICK) 140 MG/ML SOAJ INJECT 1 PEN UNDER THE SKIN EVERY 14 DAYS 6 mL 3   famotidine  (PEPCID ) 40 MG tablet TAKE 1 TABLET(40 MG) BY MOUTH DAILY 90 tablet 1   FARXIGA  10 MG TABS tablet TAKE 1 TABLET(10 MG) BY MOUTH DAILY BEFORE BREAKFAST 30 tablet 2   glucose blood test strip Use as instructed 100 each 12   hydroxychloroquine  (PLAQUENIL ) 200 MG tablet Take 400 mg by mouth daily.      isosorbide  mononitrate (IMDUR ) 30 MG 24 hr tablet Take 3 tablets (90 mg total) by mouth daily. 270 tablet 1   Lancets (ONETOUCH ULTRASOFT) lancets Use as instructed 100 each 12   losartan  (COZAAR ) 25 MG tablet TAKE 1 TABLET(25 MG) BY MOUTH DAILY 90 tablet 3   nebivolol  (BYSTOLIC ) 10 MG tablet TAKE 1 TABLET(10 MG) BY MOUTH DAILY 90 tablet 2   nitroGLYCERIN  (NITROSTAT ) 0.4 MG SL tablet Place 1 tablet (0.4 mg total) under the tongue every 5 (five) minutes as needed for chest pain. 25 tablet 11   Polyethyl Glycol-Propyl Glycol (SYSTANE) 0.4-0.3 % GEL ophthalmic gel Place 1 application into both eyes daily as needed (for dry eyes).      predniSONE  (DELTASONE ) 10 MG tablet Take 4 tablets (40 mg total) by mouth daily for 5 days. 20 tablet 0   pregabalin  (LYRICA ) 75 MG capsule TAKE 1 CAPSULE(75 MG) BY MOUTH THREE TIMES DAILY 90 capsule 2   psyllium (METAMUCIL) 58.6 % powder Take 1 packet by mouth daily.     ranolazine  (RANEXA ) 500 MG 12 hr tablet TAKE 1 TABLET(500 MG) BY MOUTH TWICE DAILY 180 tablet 3   Rimegepant Sulfate (NURTEC) 75 MG TBDP Take 1 tab at onset of migraine.  May repeat in 2 hrs, if needed.  Max dose: 2 tabs/day. This is a 30 day prescription. 16 tablet 11   torsemide  (DEMADEX ) 20 MG tablet Take one tablet by mouth every third day. 24 tablet 3   Ubrogepant  (UBRELVY ) 100 MG TABS Take 100 mg by mouth if needed at onset of migraine. May repeat in 2 hours if headache returns or persists. Max 200 mg  per 24 hours. (Patient not taking: Reported on 11/26/2023) 16 tablet 3   Current Facility-Administered Medications on File Prior to Visit  Medication Dose Route Frequency Provider Last Rate Last Admin   botulinum toxin Type A  (BOTOX ) injection 200 Units  200 Units Intramuscular Once Onita Duos, MD        Review of Systems  Constitutional:  Positive for fever (low grade).  HENT:  Positive for congestion, ear pain (one day), postnasal drip and sore throat (1-2 days). Negative for sinus pressure and sinus pain.   Respiratory:  Positive for cough (productive), shortness of breath and wheezing.   Gastrointestinal:  Positive for diarrhea (resolved). Negative for nausea.  Musculoskeletal:  Negative for myalgias.  Neurological:  Positive for light-headedness (a little) and headaches.       Objective:   Vitals:   11/26/23 1516  BP: 116/78  Pulse: 68  Temp: 99.1 F (37.3 C)  SpO2: 94%   BP Readings from Last 3 Encounters:  11/26/23 116/78  11/22/23 112/70  09/06/23 (!) 147/81   Wt Readings from Last 3 Encounters:  11/26/23 201 lb (91.2 kg)  09/06/23 203 lb 6.4 oz (92.3 kg)  08/28/23 203 lb 9.6 oz (92.4 kg)   Body mass index is 36.76 kg/m.    Physical Exam Constitutional:      General: She is not in acute distress.    Appearance: Normal appearance. She is not ill-appearing.  HENT:     Head: Normocephalic and atraumatic.     Right Ear: Tympanic membrane, ear canal and external ear normal.     Left Ear: Tympanic membrane, ear canal and external ear normal.     Mouth/Throat:     Mouth: Mucous membranes are moist.     Pharynx: No oropharyngeal exudate or posterior oropharyngeal erythema.  Eyes:     Conjunctiva/sclera: Conjunctivae normal.  Cardiovascular:     Rate and Rhythm: Normal rate and regular rhythm.  Pulmonary:     Effort: Pulmonary effort is normal. No respiratory distress.     Breath sounds: Normal breath sounds. No wheezing or rales.  Musculoskeletal:      Cervical back: Neck supple. No tenderness.  Lymphadenopathy:     Cervical: No cervical adenopathy.  Skin:    General: Skin is warm and dry.  Neurological:     Mental Status: She is alert.            Assessment & Plan:    See Problem List for Assessment and Plan of chronic medical problems.

## 2023-11-26 NOTE — Assessment & Plan Note (Signed)
 Acute Symptoms consistent with upper respiratory infection-likely viral With exacerbation of asthma COVID, flu test negative Chest x-ray without pneumonia On prednisone  for asthma exacerbation Taking Mucinex, Robitussin She will continue increased rest, fluids and over-the-counter cold medications Will hold off on an antibiotic at this time If symptoms are not improving she will let me know and we will consider an antibiotic

## 2023-11-26 NOTE — Patient Instructions (Addendum)
       Medications changes include :   None        Return if symptoms worsen or fail to improve.

## 2023-11-27 ENCOUNTER — Encounter: Payer: Self-pay | Admitting: Podiatry

## 2023-11-27 ENCOUNTER — Ambulatory Visit: Admitting: Podiatry

## 2023-11-27 DIAGNOSIS — E1142 Type 2 diabetes mellitus with diabetic polyneuropathy: Secondary | ICD-10-CM | POA: Diagnosis not present

## 2023-11-27 DIAGNOSIS — L84 Corns and callosities: Secondary | ICD-10-CM | POA: Diagnosis not present

## 2023-11-27 DIAGNOSIS — Z794 Long term (current) use of insulin: Secondary | ICD-10-CM

## 2023-11-29 ENCOUNTER — Other Ambulatory Visit: Payer: Self-pay | Admitting: Internal Medicine

## 2023-11-29 MED ORDER — TORSEMIDE 20 MG PO TABS: ORAL_TABLET | ORAL | 2 refills | Status: AC

## 2023-12-01 ENCOUNTER — Encounter: Payer: Self-pay | Admitting: Podiatry

## 2023-12-01 NOTE — Progress Notes (Signed)
 Subjective:  Patient ID: Samantha [name], female    DOB: 02/20/1940,  MRN: [id number]  Samantha [name] presents to clinic today for at risk foot care with history of diabetic neuropathy and corn(s) left lower extremity, callus(es) right lower extremity and painful mycotic nails.  Pain interferes with ambulation. Aggravating factors include wearing enclosed shoe gear. Painful toenails interfere with ambulation. Aggravating factors include wearing enclosed shoe gear. Pain is relieved with periodic professional debridement. Painful corns and calluses are aggravated when weightbearing with and without shoegear. Pain is relieved with periodic professional debridement.  Chief Complaint  Patient presents with   Diabetes    DFC NIDDM A1C 6.3. Toenail trim. LOV with PCP 11/26/23.   New problem(s): None.   PCP is Geofm Glade PARAS, MD.  Allergies  Allergen Reactions   Bactrim  [Sulfamethoxazole -Trimethoprim ] Other (See Comments)    See OV 09-15-13, rash-tongue swelling due to bactrim  ?   Cefuroxime Axetil Hives   Oxycodone  Nausea And Vomiting   Pravastatin  Other (See Comments)    muscle breakdown with profuse sweating   Terfenadine Hives and Other (See Comments)    Other reaction(s): Other (See Comments)  Other reaction(s): Not available   Zocor  [Simvastatin ] Other (See Comments)    2012 Muscle breakdown  with profuse sweating   Tramadol Nausea And Vomiting   Atorvastatin  Other (See Comments)    Pt reports muscle aches and joint pains   Cefuroxime Other (See Comments)   Lactose Intolerance (Gi) Diarrhea   Latex     blisters   Lime Flavor [Flavoring Agent (Non-Screening)] Diarrhea   Metformin  And Related Diarrhea   Oxycodone  Hcl Other (See Comments)   Pravastatin  Sodium Other (See Comments)   Rosuvastatin  Other (See Comments)    Pt reports myalgias, fatigue, nosebleeds.   Rosuvastatin  Calcium  Other (See Comments)   Tape Other (See Comments)    blisters   Wound Dressing  Adhesive Other (See Comments)    blisters    Review of Systems: Negative except as noted in the HPI.  Objective: No changes noted in today's physical examination. There were no vitals filed for this visit. Samantha [name] is a pleasant 83 y.o. female WD, WN in NAD. AAO x 3.  Vascular Examination: Capillary refill time immediate b/l. Vascular status intact b/l with palpable pedal pulses. Pedal hair present b/l. No pain with calf compression b/l. Skin temperature gradient WNL b/l. No cyanosis or clubbing b/l. No ischemia or gangrene noted b/l.   Neurological Examination: Sensation grossly intact b/l with 10 gram monofilament. Vibratory sensation intact b/l. Pt has subjective symptoms of neuropathy.  Dermatological Examination: Pedal skin with normal turgor, texture and tone b/l.  No open wounds. No interdigital macerations.   Toenails 1-5 b/l thick, discolored, elongated with subungual debris and pain on dorsal palpation.   Hyperkeratotic lesion(s) distal tip of left 3rd toe and medial IPJ of right great toe.  No erythema, no edema, no drainage, no fluctuance.  Musculoskeletal Examination: Muscle strength 5/5 to all lower extremity muscle groups bilaterally. HAV with bunion deformity noted b/l LE. Hammertoe(s) noted to the bilateral 2nd toes.. No pain, crepitus or joint limitation noted with ROM b/l LE.  Patient ambulates independently without assistive aids.  Radiographs: None  Assessment/Plan: 1. Corns and callosities   2. Controlled type 2 diabetes mellitus with diabetic polyneuropathy, with long-term current use of insulin  [facility])   -Patient was evaluated today. All questions/concerns addressed on today's visit. -Continue foot and shoe inspections daily. Monitor blood glucose per PCP/Endocrinologist's  recommendations. -Patient to continue soft, supportive shoe gear daily. -Toenails distal tip of left 3rd toe debrided in length and girth without iatrogenic bleeding with sterile  nail nipper and dremel.  -Corn(s) distal tip of left 3rd toe and callus(es) medial IPJ of right great toe were pared utilizing sterile scalpel blade without incident. Total number debrided =2. -Patient/POA to call should there be question/concern in the interim.   Return in about 3 months (around 02/27/2024).  Delon LITTIE Merlin, DPM      [place] LOCATION: [address] [place] [address]                   Office [phone]   [facility] LOCATION: [address], [place] [address] Office [phone]

## 2023-12-04 ENCOUNTER — Ambulatory Visit: Admitting: Neurology

## 2023-12-04 DIAGNOSIS — M542 Cervicalgia: Secondary | ICD-10-CM | POA: Diagnosis not present

## 2023-12-04 DIAGNOSIS — M79641 Pain in right hand: Secondary | ICD-10-CM | POA: Diagnosis not present

## 2023-12-04 DIAGNOSIS — M0609 Rheumatoid arthritis without rheumatoid factor, multiple sites: Secondary | ICD-10-CM | POA: Diagnosis not present

## 2023-12-04 DIAGNOSIS — I7300 Raynaud's syndrome without gangrene: Secondary | ICD-10-CM | POA: Diagnosis not present

## 2023-12-04 DIAGNOSIS — M1990 Unspecified osteoarthritis, unspecified site: Secondary | ICD-10-CM | POA: Diagnosis not present

## 2023-12-04 DIAGNOSIS — M797 Fibromyalgia: Secondary | ICD-10-CM | POA: Diagnosis not present

## 2023-12-04 DIAGNOSIS — Z79899 Other long term (current) drug therapy: Secondary | ICD-10-CM | POA: Diagnosis not present

## 2023-12-04 DIAGNOSIS — M79642 Pain in left hand: Secondary | ICD-10-CM | POA: Diagnosis not present

## 2023-12-14 ENCOUNTER — Other Ambulatory Visit: Payer: Self-pay | Admitting: Internal Medicine

## 2023-12-14 DIAGNOSIS — E785 Hyperlipidemia, unspecified: Secondary | ICD-10-CM

## 2023-12-14 DIAGNOSIS — I2510 Atherosclerotic heart disease of native coronary artery without angina pectoris: Secondary | ICD-10-CM

## 2023-12-18 ENCOUNTER — Ambulatory Visit: Admitting: Neurology

## 2023-12-18 VITALS — BP 138/70

## 2023-12-18 DIAGNOSIS — R519 Headache, unspecified: Secondary | ICD-10-CM

## 2023-12-18 DIAGNOSIS — G43709 Chronic migraine without aura, not intractable, without status migrainosus: Secondary | ICD-10-CM

## 2023-12-18 MED ORDER — ONABOTULINUMTOXINA 200 UNITS IJ SOLR
200.0000 [IU] | Freq: Once | INTRAMUSCULAR | Status: AC
  Administered 2023-12-18: 200 [IU] via INTRAMUSCULAR

## 2023-12-18 MED ORDER — QULIPTA 60 MG PO TABS: 60.0000 mg | ORAL_TABLET | Freq: Every day | ORAL | 11 refills | Status: AC

## 2023-12-18 NOTE — Progress Notes (Signed)
 Chief Complaint  Patient presents with   Injections    EMG RM 3, alone, PT IS WELL AND READY FOR INJECTION      ASSESSMENT AND PLAN  Samantha [name] is a 83 y.o. female   Chronic migraine Also component of cervicogenic pain due to her severe cervical degenerative disease, rheumatoid arthritis, Her headache responded to CGRP antagonist, Nurtec works better than Ubrelvy ,  approved by insurance. She has history of cervical decompression surgery, MRI in February 2024 showed ACDF from C3-C6, prominent osteophyte protrusion at C6-7, with mild canal severe right and moderate left foraminal narrowing, Significant tenderness around left nuchal line  Higher dose of Cymbalta  60 mg daily was helpful. Suggest warm compression, massage.  Botox  injection for chronic migraine prevention, injection was performed according to [place] protocol,  5 units of Botox  was injected into each side,    Bilateral frontalis 4 injection sites Bilateral temporalis 8 injection sites Bilateral occipitalis 6 injection sites Bilateral cervical paraspinals 4 injection sites Bilateral upper trapezius 6 injection sites  Extra 60 unites were injected into bilateral levator scapular and upper cervical paraspinals.  Nurtec as needed was helpful Will also start Qulipta 60 mg daily as preventive medication Will only continue Botox  injection if necessary  Fall, low back and upper back pain  X-ray of lumbar and thoracic spine in August 2025 showed multilevel degenerative changes,  Celebrex  prn  DIAGNOSTIC DATA (LABS, IMAGING, TESTING) - I reviewed patient records, labs, notes, testing and imaging myself where available. Addendum: [employer] July 12, 2023, Nurtec is not covered, Preferred options are rizatriptan, Ubrelvy , Qulipta, Emgality, naratriptan, sumatriptan   MEDICAL HISTORY:   Samantha [name] is a 83 year old female, seen in request by orthopedic surgeon Barbarann Anes C, for  evaluation of severe neck pain, radiating pain to occipital region, frequent headaches, her primary care physician is Dr. Geofm, Glade   I reviewed and summarized the referring note.PMHX RA, TIA DM Chronic kidney disease. HTN Cervical decompression surgery in 2000, prior to surgery, she has significant right cervical radiculopathy   She did have a history of cervical decompression surgery in 2000, prior to the surgery, she presented with right cervical radiculopathy, surgery did help her symptoms significantly  Since January 2023, she began to noticed significant headaches, no clear trigger noted, she now complains of neck pain, radiating pain to left occipital region, then settled behind her left eye, worsening bilateral ear tinnitus, her left side neck pain headache happening on a daily basis,  She also seems to have worsening left-sided difficulty, left leg gave out underneath her, dragging left toes, rely on a walker now, also had worsening urinary urgency involving bowel and bladder,   I personally reviewed x-ray cervical region December 2022, 3 level cervical fusion C3-4, adjacent C6-7 disc space narrowing and spondylosis   She also have significant low back pain, is under the care of of Dr. Barbarann, I personally reviewed MRI of lumbar November 2019, severe canal stenosis L4-5, multilevel degenerative changes, neuroforaminal narrowing at all levels, multilevel extraforaminal disc protrusion that may affect to the accelerated L T12, right L2, right L3 nerve roots  She also complains of significant stress, she lives with her daughter, who suffered endometriosis cancer  UPDATE May 09 2023: She can continue to have frequent headaches at least once a week, previous Nurtec works much better than Ubrelvy  she is currently taking, which take longer to take effect  She has severe neck pain especially at the left nuchal region  triggered by hyperextension, she went to the dentist couple weeks ago,  complains such severe neck pain, trigger body shaking, lasting for few hours,  Cymbalta  30 mg helps her some, tolerating it well  MRI cervical spine in February 2024 showed stable change of ACDF C3-C6, with prominent disc osteophyte protrusion at C6-7, with mild canal severe right and moderate left foraminal narrowing, C2-3 also showed mild right foraminal narrowing  UPDATE May 7th 2025: Nurtec was approved. She still complains of frequent headaches, more at occipital regions, neck and shoulder pain.  Cymbalta  higher dose 60mg  was helpful.  UPDATE August 28 2023: She is under a lot of stress, helping her daughters, previous Botox  injection did help her headache, also neck shoulder pain  She fell at the beginning of July 2025, have significant low back pain since, extending to the upper thoracic level, unsteady gait walk with a cane  UPDATE Dec 18 2023: Botox  injection did help the severity and frequency of migraine headaches  However she still have to use Nurtec as abortive treatment few times each week, works better than Ubrelvy ,  PHYSICAL EXAM:   Vitals:   12/18/23 1347 12/18/23 1354  BP: (!) 174/87 138/70   PHYSICAL EXAMNIATION:  Gen: NAD, conversant, well nourised, well groomed                     Cardiovascular: Regular rate rhythm, no peripheral edema, warm, nontender. Eyes: Conjunctivae clear without exudates or hemorrhage Neck: Supple, no carotid bruits.   Pulmonary: Clear to auscultation bilaterally   NEUROLOGICAL EXAM:  MENTAL STATUS: Speech/cognition: Awake, alert, oriented to history taking and casual conversation CRANIAL NERVES: CN II: Visual fields are full to confrontation. Pupils are round equal and briskly reactive to light. CN III, IV, VI: extraocular movement are normal. No ptosis. CN V: Facial sensation is intact to light touch CN VII: Face is symmetric with normal eye closure  CN VIII: Hearing is normal to causal conversation. CN IX, X: Phonation is  normal. CN XI: Head turning and shoulder shrug are intact  MOTOR: There is no pronator drift of out-stretched arms. Muscle bulk and tone are normal. Muscle strength is normal.  Tenderness along upper thoracic region  REFLEXES: Reflexes are 1 and symmetric at the biceps, triceps, knees, and ankles. Plantar responses are flexor.  SENSORY: Intact to light touch,    COORDINATION: There is no trunk or limb dysmetria noted.  GAIT/STANCE: Push-up to get up from seated position, cautious, mildly antalgic  REVIEW OF SYSTEMS:  Full 14 system review of systems performed and notable only for as above All other review of systems were negative.   ALLERGIES: Allergies  Allergen Reactions   Bactrim  [Sulfamethoxazole -Trimethoprim ] Other (See Comments)    See OV 09-15-13, rash-tongue swelling due to bactrim  ?   Cefuroxime Axetil Hives   Oxycodone  Nausea And Vomiting   Pravastatin  Other (See Comments)    muscle breakdown with profuse sweating   Terfenadine Hives and Other (See Comments)    Other reaction(s): Other (See Comments)  Other reaction(s): Not available   Zocor  [Simvastatin ] Other (See Comments)    2012 Muscle breakdown  with profuse sweating   Tramadol Nausea And Vomiting   Atorvastatin  Other (See Comments)    Pt reports muscle aches and joint pains   Cefuroxime Other (See Comments)   Lactose Intolerance (Gi) Diarrhea   Latex     blisters   Lime Flavor [Flavoring Agent (Non-Screening)] Diarrhea   Metformin  And Related Diarrhea  Oxycodone  Hcl Other (See Comments)   Pravastatin  Sodium Other (See Comments)   Rosuvastatin  Other (See Comments)    Pt reports myalgias, fatigue, nosebleeds.   Rosuvastatin  Calcium  Other (See Comments)   Tape Other (See Comments)    blisters   Wound Dressing Adhesive Other (See Comments)    blisters    HOME MEDICATIONS: Current Outpatient Medications  Medication Sig Dispense Refill   acetaminophen  (TYLENOL ) 500 MG tablet Take 500 mg by  mouth every 6 (six) hours as needed for mild pain.      albuterol  (PROVENTIL ) (2.5 MG/3ML) 0.083% nebulizer solution Take 3 mLs (2.5 mg total) by nebulization every 6 (six) hours as needed for wheezing or shortness of breath. 150 mL 0   albuterol  (VENTOLIN  HFA) 108 (90 Base) MCG/ACT inhaler Inhale 2 puffs into the lungs every 6 (six) hours as needed for wheezing or shortness of breath. 18 g 11   ALPRAZolam  (XANAX ) 0.5 MG tablet Take 1 tablet (0.5 mg total) by mouth 2 (two) times daily as needed for anxiety. 30 tablet 0   budesonide -formoterol  (SYMBICORT ) 160-4.5 MCG/ACT inhaler Inhale 2 puffs into the lungs 2 (two) times daily. 10.2 g 3   Calcium -Magnesium-Zinc (CAL-MAG-ZINC PO) Take 1 tablet by mouth daily.     celecoxib  (CELEBREX ) 100 MG capsule Take 1 capsule (100 mg total) by mouth daily as needed. 60 capsule 2   cetirizine  (ZYRTEC ) 10 MG tablet TAKE 1 TABLET(10 MG) BY MOUTH DAILY 90 tablet 3   cholecalciferol  (VITAMIN D ) 1000 UNITS tablet Take 1,000 Units by mouth every morning.      clopidogrel  (PLAVIX ) 75 MG tablet TAKE 1 TABLET(75 MG) BY MOUTH DAILY 90 tablet 1   dapagliflozin  propanediol (FARXIGA ) 10 MG TABS tablet TAKE 1 TABLET(10 MG) BY MOUTH DAILY BEFORE BREAKFAST 90 tablet 1   DULoxetine  (CYMBALTA ) 60 MG capsule Take 1 capsule (60 mg total) by mouth daily. 90 capsule 3   Evolocumab  (REPATHA  SURECLICK) 140 MG/ML SOAJ INJECT 1 PEN UNDER THE SKIN EVERY 14 DAYS 6 mL 1   famotidine  (PEPCID ) 40 MG tablet TAKE 1 TABLET(40 MG) BY MOUTH DAILY 90 tablet 1   FARXIGA  10 MG TABS tablet TAKE 1 TABLET(10 MG) BY MOUTH DAILY BEFORE BREAKFAST 30 tablet 2   glucose blood test strip Use as instructed 100 each 12   hydroxychloroquine  (PLAQUENIL ) 200 MG tablet Take 400 mg by mouth daily.      isosorbide  mononitrate (IMDUR ) 30 MG 24 hr tablet Take 3 tablets (90 mg total) by mouth daily. 270 tablet 1   Lancets (ONETOUCH ULTRASOFT) lancets Use as instructed 100 each 12   losartan  (COZAAR ) 25 MG tablet TAKE 1  TABLET(25 MG) BY MOUTH DAILY 90 tablet 3   nebivolol  (BYSTOLIC ) 10 MG tablet TAKE 1 TABLET(10 MG) BY MOUTH DAILY 90 tablet 2   nitroGLYCERIN  (NITROSTAT ) 0.4 MG SL tablet Place 1 tablet (0.4 mg total) under the tongue every 5 (five) minutes as needed for chest pain. 25 tablet 11   Polyethyl Glycol-Propyl Glycol (SYSTANE) 0.4-0.3 % GEL ophthalmic gel Place 1 application into both eyes daily as needed (for dry eyes).      pregabalin  (LYRICA ) 75 MG capsule TAKE 1 CAPSULE(75 MG) BY MOUTH THREE TIMES DAILY 90 capsule 2   psyllium (METAMUCIL) 58.6 % powder Take 1 packet by mouth daily.     ranolazine  (RANEXA ) 500 MG 12 hr tablet TAKE 1 TABLET(500 MG) BY MOUTH TWICE DAILY 180 tablet 3   Rimegepant Sulfate (NURTEC) 75 MG TBDP Take 1  tab at onset of migraine.  May repeat in 2 hrs, if needed.  Max dose: 2 tabs/day. This is a 30 day prescription. 16 tablet 11   torsemide  (DEMADEX ) 20 MG tablet Take one tablet by mouth every third day. 24 tablet 2   Current Facility-Administered Medications  Medication Dose Route Frequency Provider Last Rate Last Admin   botulinum toxin Type A  (BOTOX ) injection 200 Units  200 Units Intramuscular Once Onita Duos, MD       botulinum toxin Type A  (BOTOX ) injection 200 Units  200 Units Intramuscular Once Onita Duos, MD        PAST MEDICAL HISTORY: Past Medical History:  Diagnosis Date   A-fib [facility])    Abnormal CT of the chest 2008   last CT4-l 2009:  . No f/u suggested    Allergy    Asthma    CAD (coronary artery disease)    a. Coronary CTA 10/16: Coronary Ca score 211, mod non-obstructive CAD with LM mild plaque (25-50%), mid LAD 50-69%. b. Neg nuc 06/2015.   Cataract    BILATERAL-REMOVED   Chronic diastolic CHF (congestive heart failure) (HCC)    Collagen vascular disease    arterial sclerosis per pt   Complication of anesthesia    trouble waking up   Fibromyalgia    GERD (gastroesophageal reflux disease)    H/O hiatal hernia    Hearing loss    left ear    Heart murmur    Hyperlipidemia    Hypertension    Kidney disease 04/04/2022   Malignant neoplasm of ascending colon (HCC) 2016   Minimally invasive right hemicolectomy to be done    Neuromuscular disorder (HCC)    FIBROMYALGIA   Ocular migraine    OSA (obstructive sleep apnea) 09/2007   dx w/ a sleep study, not on  CPAP   Osteoarthritis    Osteoporosis    Pneumonia    double in 2004   PONV (postoperative nausea and vomiting)    Reactive airway disease 01/29/2002   dx of pseudoasthma / vcd in 2005 and nl sprirometry History of dyspnea, 2011,  improved after several medications were changed around Question of COPD, disproved July 06, 2009 with nl pft's       Rheumatoid factor positive    Shingles 11/2009   Sleep apnea    Stroke (HCC)    TIA (transient ischemic attack)    x2 - on Plavix  for this   Torn rotator cuff    right worse than left, both are torn   Tumor, 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 Esmeralda Sharps (done after colon surgery)   CATARACT EXTRACTION W/ INTRAOCULAR LENS  IMPLANT, BILATERAL  2012   COLON SURGERY  10/2014   EYE SURGERY Bilateral    torq lens for cataracts   LEFT HEART CATH AND CORONARY ANGIOGRAPHY N/A 03/22/2016   Procedure: Left Heart Cath and Coronary Angiography;  Surgeon: Victory LELON Sharps, MD;  [facility] INVASIVE CV  LAB;  Service: Cardiovascular;  Laterality: N/A;   LUMBAR LAMINECTOMY/DECOMPRESSION MICRODISCECTOMY N/A 01/20/2018   Procedure: L4-5 decompression;  Surgeon: Barbarann Oneil BROCKS, MD;  Location: [facility] OR;  Service: Orthopedics;  Laterality:  N/A;   THYROIDECTOMY, PARTIAL  1960's   TOTAL KNEE ARTHROPLASTY Right 12/04/2019   Procedure: RIGHT TOTAL KNEE ARTHROPLASTY;  Surgeon: Barbarann Oneil BROCKS, MD;  Location: [facility] OR;  Service: Orthopedics;  Laterality: Right;  RNFA APRIL PLEASE   VAGINAL MASS EXCISION  1994   Laser surgery for vaginal cancer; followed by chemotherapy (06/27/2012)    FAMILY HISTORY: Family History  Problem Relation Age of Onset   Heart disease Father    Heart disease Mother    Lung cancer Mother    Allergies Sister    Parkinsonism Sister        possible   Asthma Sister    Asthma Paternal Grandmother    Stroke Paternal Grandmother    Heart disease Other        paternal grandparents, maternal grandparents,    Heart disease Brother    Emphysema Brother    Aneurysm Brother        x3   Kidney failure Brother    Diabetes Brother    Diabetes Brother    Stroke Brother    Stroke Maternal Grandmother    Breast cancer Neg Hx    Colon cancer Neg Hx    Heart attack Neg Hx    Esophageal cancer Neg Hx    Rectal cancer Neg Hx    Stomach cancer Neg Hx     SOCIAL HISTORY: Social History   Socioeconomic History   Marital status: Widowed    Spouse name: Aida   Number of children: 2   Years of education: masters   Highest education level: Not on file  Occupational History   Occupation: Retired, disable since 2000    Employer: RETIRED  Tobacco Use   Smoking status: Former    Current packs/day: 0.00    Average packs/day: 0.3 packs/day for 5.0 years (1.3 ttl pk-yrs)    Types: Cigarettes    Start date: 01/29/1993    Quit date: 01/29/1998    Years since quitting: 25.9    Passive exposure: Past   Smokeless tobacco: Never   Tobacco comments:    Quit in 2001  Vaping Use   Vaping status: Never Used  Substance and Sexual Activity   Alcohol  use: No    Alcohol /week: 0.0 standard drinks of alcohol    Drug use: No   Sexual activity: Never  Other Topics Concern   Not on file  Social History Narrative   On  disability since 2000--- also husband has MS   Education. College.   Right handed.      02/15/2022   Patient's daughter lives with her   Social Drivers of Corporate Investment Banker Strain: Low Risk  (05/03/2023)   Overall Financial Resource Strain (CARDIA)    Difficulty of Paying Living Expenses: Not hard at all  Food Insecurity: No Food Insecurity (05/03/2023)   Hunger Vital Sign    Worried About Running Out of Food in the Last Year: Never true    Ran Out of Food in the Last Year: Never true  Transportation Needs: No Transportation Needs (05/03/2023)   PRAPARE - Administrator, Civil Service (Medical): No    Lack of Transportation (Non-Medical): No  Physical Activity: Sufficiently Active (05/03/2023)   Exercise Vital Sign    Days of Exercise per  Week: 5 days    Minutes of Exercise per Session: 30 min  Recent Concern: Physical Activity - Insufficiently Active (05/03/2023)   Exercise Vital Sign    Days of Exercise per Week: 3 days    Minutes of Exercise per Session: 20 min  Stress: No Stress Concern Present (05/03/2023)   [employer] of Occupational Health - Occupational Stress Questionnaire    Feeling of Stress : Not at all  Social Connections: Moderately Isolated (05/03/2023)   Social Connection and Isolation Panel    Frequency of Communication with Friends and Family: More than three times a week    Frequency of Social Gatherings with Friends and Family: Never    Attends Religious Services: 1 to 4 times per year    Active Member of [employer] or Organizations: No    Attends Banker Meetings: Never    Marital Status: Widowed  Intimate Partner Violence: Not At Risk (05/03/2023)   Humiliation, Afraid, Rape, and Kick questionnaire    Fear of Current or Ex-Partner: No    Emotionally Abused: No    Physically Abused: No    Sexually Abused: No      Modena Callander, M.D. Ph.D.  [facility] [address], [address] [place], [place] [address] Ph: [phone] Fax: [phone]  CC:  Geofm Glade PARAS, MD [address] [place],  [place] [address]  Geofm Glade PARAS, MD

## 2023-12-18 NOTE — Progress Notes (Signed)
 Botox - 200 units x 1 vial Lot: [id number] Expiration: 2028/03 NDC: 0023-3921-02  Bacteriostatic 0.9% Sodium Chloride - 4 mL  Lot: [id number] Expiration: 11/28/24 NDC: 9590803397  Dx:  H56.290      B/B Witnessed by JINNY ROYS RMA

## 2023-12-25 ENCOUNTER — Ambulatory Visit: Admitting: Family Medicine

## 2024-01-02 ENCOUNTER — Other Ambulatory Visit: Payer: Self-pay | Admitting: Physician Assistant

## 2024-01-03 ENCOUNTER — Other Ambulatory Visit: Payer: Self-pay | Admitting: Internal Medicine

## 2024-01-03 DIAGNOSIS — R079 Chest pain, unspecified: Secondary | ICD-10-CM

## 2024-01-03 DIAGNOSIS — R002 Palpitations: Secondary | ICD-10-CM

## 2024-01-27 ENCOUNTER — Other Ambulatory Visit: Payer: Self-pay | Admitting: Internal Medicine

## 2024-01-28 ENCOUNTER — Other Ambulatory Visit (HOSPITAL_COMMUNITY): Payer: Self-pay

## 2024-01-28 ENCOUNTER — Telehealth: Payer: Self-pay

## 2024-01-28 NOTE — Telephone Encounter (Signed)
 Pharmacy Patient Advocate Encounter  Received notification from [employer] that Prior Authorization for Nurtec has been APPROVED from 01/28/2024 to 01/28/2025. Ran test claim, Copay is $0. This test claim was processed through [facility]- copay amounts may vary at other pharmacies due to pharmacy/plan contracts, or as the patient moves through the different stages of their insurance plan.   PA #/Case ID/Reference #: [id number]

## 2024-01-28 NOTE — Telephone Encounter (Signed)
 Pharmacy Patient Advocate Encounter   Received notification from Fax that prior authorization for Nurtec is required/requested.   Insurance verification completed.   The patient is insured through [employer].   Per test claim: PA required; PA submitted to above mentioned insurance via Latent Key/confirmation #/EOC [id number] Status is pending

## 2024-01-29 ENCOUNTER — Other Ambulatory Visit: Payer: Self-pay | Admitting: Internal Medicine

## 2024-02-04 NOTE — Progress Notes (Signed)
 " [facility] [address] [place] [address] Phone: [phone] Subjective:   Samantha [name], am serving as a scribe for Dr. Arthea Claudene.  I'm seeing this patient by the request  of:  Geofm Glade PARAS, MD  CC: Back pain  YEP:Dlagzrupcz  Samantha [name] is a 84 y.o. female coming in with complaint of back pain. Patient states that her lower back pain has diminished. Believes that she picked up a box incorrectly.   Having occipital nerve damage on L side of neck. Unable to hear as well in L ear. Fusion of 3-5. Pain will also radiate down her arm and into L pec.   Medications patient is on including Cymbalta , Plavix , Celebrex , Lyrica    Past medical history significant for sacral back pain, vertebral basilar insufficiency, spinal stenosis of the cervical spine, neuropathy as well as colon cancer.  Stage III chronic kidney disease.  Patient had lumbar x-rays in August of last year showing moderate to severe degenerative changes of the lumbar spine and moderate of the thoracic spine.  Extensive atherosclerotic changes noted.  Severe facet arthropathy noted throughout the lumbar spine as well.  Past Medical History:  Diagnosis Date   A-fib [facility])    Abnormal CT of the chest 2008   last CT4-l 2009:  . No f/u suggested    Allergy    Asthma    CAD (coronary artery disease)    a. Coronary CTA 10/16: Coronary Ca score 211, mod non-obstructive CAD with LM mild plaque (25-50%), mid LAD 50-69%. b. Neg nuc 06/2015.   Cataract    BILATERAL-REMOVED   Chronic diastolic CHF (congestive heart failure) (HCC)    Collagen vascular disease    arterial sclerosis per pt   Complication of anesthesia    trouble waking up   Fibromyalgia    GERD (gastroesophageal reflux disease)    H/O hiatal hernia    Hearing loss    left ear   Heart murmur    Hyperlipidemia    Hypertension    Kidney disease 04/04/2022   Malignant neoplasm of ascending colon (HCC) 2016    Minimally invasive right hemicolectomy to be done    Neuromuscular disorder (HCC)    FIBROMYALGIA   Ocular migraine    OSA (obstructive sleep apnea) 09/2007   dx w/ a sleep study, not on  CPAP   Osteoarthritis    Osteoporosis    Pneumonia    double in 2004   PONV (postoperative nausea and vomiting)    Reactive airway disease 01/29/2002   dx of pseudoasthma / vcd in 2005 and nl sprirometry History of dyspnea, 2011,  improved after several medications were changed around Question of COPD, disproved July 06, 2009 with nl pft's       Rheumatoid factor positive    Shingles 11/2009   Sleep apnea    Stroke (HCC)    TIA (transient ischemic attack)    x2 - on Plavix  for this   Torn rotator cuff    right worse than left, both are torn   Tumor, 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   Type II diabetes mellitus (HCC)    Vaginal cancer (HCC) 1994   Vaginal dysplasia    Past Surgical History:  Procedure Laterality Date   ABDOMINAL HYSTERECTOMY  1980   NO oophorectomy per pt    ANTERIOR CERVICAL DECOMP/DISCECTOMY FUSION  2001   C 3, C4 and C5 plate and screws  BREAST BIOPSY Right 1999   BUNIONECTOMY Left ~ 1977   CARDIAC CATHETERIZATION     2018 By Dr. Esmeralda Sharps (done after colon surgery)   CATARACT EXTRACTION W/ INTRAOCULAR LENS  IMPLANT, BILATERAL  2012   COLON SURGERY  10/2014   EYE SURGERY Bilateral    torq lens for cataracts   LEFT HEART CATH AND CORONARY ANGIOGRAPHY N/A 03/22/2016   Procedure: Left Heart Cath and Coronary Angiography;  Surgeon: Victory LELON Sharps, MD;  Location: [facility] INVASIVE CV LAB;  Service: Cardiovascular;  Laterality: N/A;   LUMBAR LAMINECTOMY/DECOMPRESSION MICRODISCECTOMY N/A 01/20/2018   Procedure: L4-5 decompression;  Surgeon: Barbarann Oneil BROCKS, MD;  Location: [facility] OR;  Service: Orthopedics;  Laterality: N/A;   THYROIDECTOMY, PARTIAL  1960's   TOTAL KNEE ARTHROPLASTY Right 12/04/2019   Procedure: RIGHT TOTAL KNEE ARTHROPLASTY;  Surgeon: Barbarann Oneil BROCKS, MD;   Location: [facility] OR;  Service: Orthopedics;  Laterality: Right;  RNFA APRIL PLEASE   VAGINAL MASS EXCISION  1994   Laser surgery for vaginal cancer; followed by chemotherapy (06/27/2012)   Social History   Socioeconomic History   Marital status: Widowed    Spouse name: Aida   Number of children: 2   Years of education: masters   Highest education level: Not on file  Occupational History   Occupation: Retired, disable since 2000    Employer: RETIRED  Tobacco Use   Smoking status: Former    Current packs/day: 0.00    Average packs/day: 0.3 packs/day for 5.0 years (1.3 ttl pk-yrs)    Types: Cigarettes    Start date: 01/29/1993    Quit date: 01/29/1998    Years since quitting: 26.0    Passive exposure: Past   Smokeless tobacco: Never   Tobacco comments:    Quit in 2001  Vaping Use   Vaping status: Never Used  Substance and Sexual Activity   Alcohol  use: No    Alcohol /week: 0.0 standard drinks of alcohol    Drug use: No   Sexual activity: Never  Other Topics Concern   Not on file  Social History Narrative   On disability since 2000--- also husband has MS   Education. College.   Right handed.      02/15/2022   Patient's daughter lives with her   Social Drivers of Health   Tobacco Use: Medium Risk (12/01/2023)   Patient History    Smoking Tobacco Use: Former    Smokeless Tobacco Use: Never    Passive Exposure: Past  Physicist, Medical Strain: Low Risk (05/03/2023)   Overall Financial Resource Strain (CARDIA)    Difficulty of Paying Living Expenses: Not hard at all  Food Insecurity: No Food Insecurity (05/03/2023)   Hunger Vital Sign    Worried About Running Out of Food in the Last Year: Never true    Ran Out of Food in the Last Year: Never true  Transportation Needs: No Transportation Needs (05/03/2023)   PRAPARE - Administrator, Civil Service (Medical): No    Lack of Transportation (Non-Medical): No  Physical Activity: Sufficiently Active (05/03/2023)   Exercise  Vital Sign    Days of Exercise per Week: 5 days    Minutes of Exercise per Session: 30 min  Recent Concern: Physical Activity - Insufficiently Active (05/03/2023)   Exercise Vital Sign    Days of Exercise per Week: 3 days    Minutes of Exercise per Session: 20 min  Stress: No Stress Concern Present (05/03/2023)   [employer] of Occupational Health -  Occupational Stress Questionnaire    Feeling of Stress : Not at all  Social Connections: Moderately Isolated (05/03/2023)   Social Connection and Isolation Panel    Frequency of Communication with Friends and Family: More than three times a week    Frequency of Social Gatherings with Friends and Family: Never    Attends Religious Services: 1 to 4 times per year    Active Member of [employer] or Organizations: No    Attends Banker Meetings: Never    Marital Status: Widowed  Depression (PHQ2-9): Low Risk (07/17/2023)   Depression (PHQ2-9)    PHQ-2 Score: 0  Alcohol  Screen: Low Risk (05/03/2023)   Alcohol  Screen    Last Alcohol  Screening Score (AUDIT): 0  Housing: Unknown (05/03/2023)   Housing Stability Vital Sign    Unable to Pay for Housing in the Last Year: No    Number of Times Moved in the Last Year: Not on file    Homeless in the Last Year: No  Utilities: Not At Risk (05/03/2023)   AHC Utilities    Threatened with loss of utilities: No  Health Literacy: Adequate Health Literacy (05/03/2023)   B1300 Health Literacy    Frequency of need for help with medical instructions: Never   Allergies[1] Family History  Problem Relation Age of Onset   Heart disease Father    Heart disease Mother    Lung cancer Mother    Allergies Sister    Parkinsonism Sister        possible   Asthma Sister    Asthma Paternal Grandmother    Stroke Paternal Grandmother    Heart disease Other        paternal grandparents, maternal grandparents,    Heart disease Brother    Emphysema Brother    Aneurysm Brother        x3   Kidney failure Brother     Diabetes Brother    Diabetes Brother    Stroke Brother    Stroke Maternal Grandmother    Breast cancer Neg Hx    Colon cancer Neg Hx    Heart attack Neg Hx    Esophageal cancer Neg Hx    Rectal cancer Neg Hx    Stomach cancer Neg Hx     Current Outpatient Medications (Endocrine & Metabolic):    dapagliflozin  propanediol (FARXIGA ) 10 MG TABS tablet, TAKE 1 TABLET(10 MG) BY MOUTH DAILY BEFORE BREAKFAST   FARXIGA  10 MG TABS tablet, TAKE 1 TABLET(10 MG) BY MOUTH DAILY BEFORE BREAKFAST  Current Outpatient Medications (Cardiovascular):    Evolocumab  (REPATHA  SURECLICK) 140 MG/ML SOAJ, INJECT 1 PEN UNDER THE SKIN EVERY 14 DAYS   isosorbide  mononitrate (IMDUR ) 30 MG 24 hr tablet, TAKE 3 TABLETS(90 MG) BY MOUTH DAILY   losartan  (COZAAR ) 25 MG tablet, TAKE 1 TABLET(25 MG) BY MOUTH DAILY   nebivolol  (BYSTOLIC ) 10 MG tablet, TAKE 1 TABLET(10 MG) BY MOUTH DAILY   nitroGLYCERIN  (NITROSTAT ) 0.4 MG SL tablet, Place 1 tablet (0.4 mg total) under the tongue every 5 (five) minutes as needed for chest pain.   ranolazine  (RANEXA ) 500 MG 12 hr tablet, TAKE 1 TABLET(500 MG) BY MOUTH TWICE DAILY   torsemide  (DEMADEX ) 20 MG tablet, Take one tablet by mouth every third day.  Current Outpatient Medications (Respiratory):    montelukast  (SINGULAIR ) 10 MG tablet, Take 1 tablet (10 mg total) by mouth at bedtime.   albuterol  (PROVENTIL ) (2.5 MG/3ML) 0.083% nebulizer solution, Take 3 mLs (2.5 mg total) by nebulization every 6 (  six) hours as needed for wheezing or shortness of breath.   albuterol  (VENTOLIN  HFA) 108 (90 Base) MCG/ACT inhaler, Inhale 2 puffs into the lungs every 6 (six) hours as needed for wheezing or shortness of breath.   budesonide -formoterol  (SYMBICORT ) 160-4.5 MCG/ACT inhaler, Inhale 2 puffs into the lungs 2 (two) times daily.   cetirizine  (ZYRTEC ) 10 MG tablet, TAKE 1 TABLET(10 MG) BY MOUTH DAILY  Current Outpatient Medications (Analgesics):    acetaminophen  (TYLENOL ) 500 MG tablet, Take 500  mg by mouth every 6 (six) hours as needed for mild pain.    Atogepant  (QULIPTA ) 60 MG TABS, Take 1 tablet (60 mg total) by mouth daily.   celecoxib  (CELEBREX ) 100 MG capsule, Take 1 capsule (100 mg total) by mouth daily as needed.   Rimegepant Sulfate (NURTEC) 75 MG TBDP, Take 1 tab at onset of migraine.  May repeat in 2 hrs, if needed.  Max dose: 2 tabs/day. This is a 30 day prescription.  Current Outpatient Medications (Hematological):    clopidogrel  (PLAVIX ) 75 MG tablet, TAKE 1 TABLET(75 MG) BY MOUTH DAILY  Current Outpatient Medications (Other):    ALPRAZolam  (XANAX ) 0.5 MG tablet, Take 1 tablet (0.5 mg total) by mouth 2 (two) times daily as needed for anxiety.   Calcium -Magnesium-Zinc (CAL-MAG-ZINC PO), Take 1 tablet by mouth daily.   cholecalciferol  (VITAMIN D ) 1000 UNITS tablet, Take 1,000 Units by mouth every morning.    DULoxetine  (CYMBALTA ) 60 MG capsule, Take 1 capsule (60 mg total) by mouth daily.   famotidine  (PEPCID ) 40 MG tablet, TAKE 1 TABLET(40 MG) BY MOUTH DAILY   glucose blood test strip, Use as instructed   hydroxychloroquine  (PLAQUENIL ) 200 MG tablet, Take 400 mg by mouth daily.    Lancets (ONETOUCH ULTRASOFT) lancets, Use as instructed   Polyethyl Glycol-Propyl Glycol (SYSTANE) 0.4-0.3 % GEL ophthalmic gel, Place 1 application into both eyes daily as needed (for dry eyes).    pregabalin  (LYRICA ) 75 MG capsule, TAKE 1 CAPSULE(75 MG) BY MOUTH THREE TIMES DAILY   psyllium (METAMUCIL) 58.6 % powder, Take 1 packet by mouth daily.  Current Facility-Administered Medications (Other):    botulinum toxin Type A  (BOTOX ) injection 200 Units   Reviewed prior external information including notes and imaging from  primary care provider As well as notes that were available from care everywhere and other healthcare systems.  Past medical history, social, surgical and family history all reviewed in electronic medical record.  No pertanent information unless stated regarding to the  chief complaint.   Review of Systems:  No headache, visual changes, nausea, vomiting, diarrhea, constipation, dizziness, abdominal pain, skin rash, fevers, chills, night sweats, weight loss, swollen lymph nodes, body aches, joint swelling, chest pain, shortness of breath, mood changes. POSITIVE muscle aches  Objective  Blood pressure 122/72, pulse 70, height 5' 2 (1.575 m), weight 203 lb (92.1 kg), SpO2 98%.   General: No apparent distress alert and oriented x3 mood and affect normal, dressed appropriately.  HEENT: Pupils equal, extraocular movements intact  Respiratory: Patient's speak in full sentences and does not appear short of breath  Cardiovascular: No lower extremity edema, non tender, no erythema  Significant arthritic changes in multiple joints.  Patient's left shoulder does have some mild weakness against resistance.  Neck exam has significant loss of lordosis.  Patient likely does have relatively good strength in the hands.   [id number]; 15 additional minutes spent for Therapeutic exercises as stated in above notes.  This included exercises focusing on stretching, strengthening, with significant  focus on eccentric aspects.   Long term goals include an improvement in range of motion, strength, endurance as well as avoiding reinjury. Patient's frequency would include in 1-2 times a day, 3-5 times a week for a duration of 6-12 weeks.  Exercises that included:  Basic scapular stabilization to include adduction and depression of scapula Scaption, focusing on proper movement and good control Internal and External rotation utilizing a theraband, with elbow tucked at side entire time Rows with theraband  Proper technique shown and discussed handout in great detail with ATC.  All questions were discussed and answered.     Impression and Recommendations:     The above documentation has been reviewed and is accurate and complete Arthea CHRISTELLA Sharps, DO       [1]  Allergies Allergen  Reactions   Bactrim  [Sulfamethoxazole -Trimethoprim ] Other (See Comments)    See OV 09-15-13, rash-tongue swelling due to bactrim  ?   Cefuroxime Axetil Hives   Oxycodone  Nausea And Vomiting   Pravastatin  Other (See Comments)    muscle breakdown with profuse sweating   Terfenadine Hives and Other (See Comments)    Other reaction(s): Other (See Comments)  Other reaction(s): Not available   Zocor  [Simvastatin ] Other (See Comments)    2012 Muscle breakdown  with profuse sweating   Tramadol Nausea And Vomiting   Atorvastatin  Other (See Comments)    Pt reports muscle aches and joint pains   Cefuroxime Other (See Comments)   Lactose Intolerance (Gi) Diarrhea   Latex     blisters   Lime Flavor [Flavoring Agent (Non-Screening)] Diarrhea   Metformin  And Related Diarrhea   Oxycodone  Hcl Other (See Comments)   Pravastatin  Sodium Other (See Comments)   Rosuvastatin  Other (See Comments)    Pt reports myalgias, fatigue, nosebleeds.   Rosuvastatin  Calcium  Other (See Comments)   Tape Other (See Comments)    blisters   Wound Dressing Adhesive Other (See Comments)    blisters   "

## 2024-02-06 ENCOUNTER — Encounter: Payer: Self-pay | Admitting: Family Medicine

## 2024-02-06 ENCOUNTER — Ambulatory Visit

## 2024-02-06 ENCOUNTER — Ambulatory Visit: Admitting: Family Medicine

## 2024-02-06 VITALS — BP 122/72 | HR 70 | Ht 62.0 in | Wt 203.0 lb

## 2024-02-06 DIAGNOSIS — M542 Cervicalgia: Secondary | ICD-10-CM

## 2024-02-06 DIAGNOSIS — M5030 Other cervical disc degeneration, unspecified cervical region: Secondary | ICD-10-CM | POA: Insufficient documentation

## 2024-02-06 MED ORDER — MONTELUKAST SODIUM 10 MG PO TABS: 10.0000 mg | ORAL_TABLET | Freq: Every day | ORAL | 0 refills | Status: AC

## 2024-02-06 NOTE — Assessment & Plan Note (Signed)
 n arthritic changes noted.  Discussed with patient about icing regimen.  Discussed different treatment options which patient states that right now the headaches and the pain is stopping her from activity has had a fusion previously.  Discussed with patient about the possibility of trying Singulair  with patient having some metal that could be causing some irritation in the area.  With patient's other comorbidities and medications there is not many other possible treatment options.  Patient has elected to skip physical therapy secondary to her taking care of her ailing daughter.  Given home exercises and work with event organiser today.  Patient is motivated to get better.  Discussed over-the-counter medications and which ones to potentially could be beneficial as well as which ones to avoid.  Follow-up with me again 2 months

## 2024-02-06 NOTE — Patient Instructions (Addendum)
 Exercises Singulair  10mg  at night Tart cherry 2400mg  at night Xray of neck See me in 2-3 months

## 2024-02-14 ENCOUNTER — Ambulatory Visit: Payer: Self-pay | Admitting: Family Medicine

## 2024-02-24 ENCOUNTER — Encounter: Payer: Self-pay | Admitting: Internal Medicine

## 2024-02-24 NOTE — Progress Notes (Unsigned)
 "     Subjective:    Patient ID: Samantha [name], female    DOB: 1940/11/11, 84 y.o.   MRN: [id number]     HPI Adjoa is here for follow up of her chronic medical problems.    Medications and allergies reviewed with patient and updated if appropriate.  Medications Ordered Prior to Encounter[1]   Review of Systems     Objective:  There were no vitals filed for this visit. BP Readings from Last 3 Encounters:  02/06/24 122/72  12/18/23 138/70  11/26/23 116/78   Wt Readings from Last 3 Encounters:  02/06/24 203 lb (92.1 kg)  11/26/23 201 lb (91.2 kg)  09/06/23 203 lb 6.4 oz (92.3 kg)   There is no height or weight on file to calculate BMI.    Physical Exam     Lab Results  Component Value Date   WBC 3.9 (L) 07/17/2023   HGB 13.6 07/17/2023   HCT 41.2 07/17/2023   PLT 161.0 07/17/2023   GLUCOSE 87 07/17/2023   CHOL 124 03/04/2023   TRIG 169.0 (H) 03/04/2023   HDL 70.20 03/04/2023   LDLDIRECT 126.2 12/07/2010   LDLCALC 20 03/04/2023   ALT 13 07/17/2023   AST 16 07/17/2023   NA 139 07/17/2023   K 4.2 07/17/2023   CL 102 07/17/2023   CREATININE 1.09 07/17/2023   BUN 25 (H) 07/17/2023   CO2 33 (H) 07/17/2023   TSH 2.44 08/28/2022   INR 0.95 07/22/2017   HGBA1C 5.9 (A) 08/27/2023   HGBA1C 5.9 08/27/2023   HGBA1C 5.9 08/27/2023   HGBA1C 5.9 08/27/2023   MICROALBUR 1.0 08/27/2023     Assessment & Plan:    See Problem List for Assessment and Plan of chronic medical problems.       [1]  Current Outpatient Medications on File Prior to Visit  Medication Sig Dispense Refill   acetaminophen  (TYLENOL ) 500 MG tablet Take 500 mg by mouth every 6 (six) hours as needed for mild pain.      albuterol  (PROVENTIL ) (2.5 MG/3ML) 0.083% nebulizer solution Take 3 mLs (2.5 mg total) by nebulization every 6 (six) hours as needed for wheezing or shortness of breath. 150 mL 0   albuterol  (VENTOLIN  HFA) 108 (90 Base) MCG/ACT inhaler Inhale 2 puffs into the lungs  every 6 (six) hours as needed for wheezing or shortness of breath. 18 g 11   ALPRAZolam  (XANAX ) 0.5 MG tablet Take 1 tablet (0.5 mg total) by mouth 2 (two) times daily as needed for anxiety. 30 tablet 0   Atogepant  (QULIPTA ) 60 MG TABS Take 1 tablet (60 mg total) by mouth daily. 30 tablet 11   budesonide -formoterol  (SYMBICORT ) 160-4.5 MCG/ACT inhaler Inhale 2 puffs into the lungs 2 (two) times daily. 10.2 g 3   Calcium -Magnesium-Zinc (CAL-MAG-ZINC PO) Take 1 tablet by mouth daily.     celecoxib  (CELEBREX ) 100 MG capsule Take 1 capsule (100 mg total) by mouth daily as needed. 60 capsule 2   cetirizine  (ZYRTEC ) 10 MG tablet TAKE 1 TABLET(10 MG) BY MOUTH DAILY 90 tablet 3   cholecalciferol  (VITAMIN D ) 1000 UNITS tablet Take 1,000 Units by mouth every morning.      clopidogrel  (PLAVIX ) 75 MG tablet TAKE 1 TABLET(75 MG) BY MOUTH DAILY 90 tablet 1   dapagliflozin  propanediol (FARXIGA ) 10 MG TABS tablet TAKE 1 TABLET(10 MG) BY MOUTH DAILY BEFORE BREAKFAST 90 tablet 1   DULoxetine  (CYMBALTA ) 60 MG capsule Take 1 capsule (60 mg total) by mouth daily. 90  capsule 3   Evolocumab  (REPATHA  SURECLICK) 140 MG/ML SOAJ INJECT 1 PEN UNDER THE SKIN EVERY 14 DAYS 6 mL 1   famotidine  (PEPCID ) 40 MG tablet TAKE 1 TABLET(40 MG) BY MOUTH DAILY 90 tablet 1   FARXIGA  10 MG TABS tablet TAKE 1 TABLET(10 MG) BY MOUTH DAILY BEFORE BREAKFAST 30 tablet 2   glucose blood test strip Use as instructed 100 each 12   hydroxychloroquine  (PLAQUENIL ) 200 MG tablet Take 400 mg by mouth daily.      isosorbide  mononitrate (IMDUR ) 30 MG 24 hr tablet TAKE 3 TABLETS(90 MG) BY MOUTH DAILY 270 tablet 2   Lancets (ONETOUCH ULTRASOFT) lancets Use as instructed 100 each 12   losartan  (COZAAR ) 25 MG tablet TAKE 1 TABLET(25 MG) BY MOUTH DAILY 90 tablet 2   montelukast  (SINGULAIR ) 10 MG tablet Take 1 tablet (10 mg total) by mouth at bedtime. 30 tablet 0   nebivolol  (BYSTOLIC ) 10 MG tablet TAKE 1 TABLET(10 MG) BY MOUTH DAILY 90 tablet 2    nitroGLYCERIN  (NITROSTAT ) 0.4 MG SL tablet Place 1 tablet (0.4 mg total) under the tongue every 5 (five) minutes as needed for chest pain. 25 tablet 11   Polyethyl Glycol-Propyl Glycol (SYSTANE) 0.4-0.3 % GEL ophthalmic gel Place 1 application into both eyes daily as needed (for dry eyes).      pregabalin  (LYRICA ) 75 MG capsule TAKE 1 CAPSULE(75 MG) BY MOUTH THREE TIMES DAILY 90 capsule 2   psyllium (METAMUCIL) 58.6 % powder Take 1 packet by mouth daily.     ranolazine  (RANEXA ) 500 MG 12 hr tablet TAKE 1 TABLET(500 MG) BY MOUTH TWICE DAILY 180 tablet 2   Rimegepant Sulfate (NURTEC) 75 MG TBDP Take 1 tab at onset of migraine.  May repeat in 2 hrs, if needed.  Max dose: 2 tabs/day. This is a 30 day prescription. 16 tablet 11   torsemide  (DEMADEX ) 20 MG tablet Take one tablet by mouth every third day. 24 tablet 2   Current Facility-Administered Medications on File Prior to Visit  Medication Dose Route Frequency Provider Last Rate Last Admin   botulinum toxin Type A  (BOTOX ) injection 200 Units  200 Units Intramuscular Once Onita Duos, MD       "

## 2024-02-24 NOTE — Patient Instructions (Addendum)
" ° ° ° ° °  Blood work was ordered.       Medications changes include :   None    A referral was ordered and someone will call you to schedule an appointment.     Return in about 6 months (around 08/26/2024) for follow up.  "

## 2024-02-25 ENCOUNTER — Encounter: Payer: Self-pay | Admitting: Gastroenterology

## 2024-02-27 ENCOUNTER — Ambulatory Visit: Admitting: Internal Medicine

## 2024-02-27 DIAGNOSIS — E559 Vitamin D deficiency, unspecified: Secondary | ICD-10-CM

## 2024-02-27 DIAGNOSIS — M069 Rheumatoid arthritis, unspecified: Secondary | ICD-10-CM

## 2024-02-27 DIAGNOSIS — G629 Polyneuropathy, unspecified: Secondary | ICD-10-CM

## 2024-02-27 DIAGNOSIS — N1832 Chronic kidney disease, stage 3b: Secondary | ICD-10-CM

## 2024-02-27 DIAGNOSIS — E1159 Type 2 diabetes mellitus with other circulatory complications: Secondary | ICD-10-CM

## 2024-02-27 DIAGNOSIS — K219 Gastro-esophageal reflux disease without esophagitis: Secondary | ICD-10-CM

## 2024-02-27 DIAGNOSIS — E1169 Type 2 diabetes mellitus with other specified complication: Secondary | ICD-10-CM

## 2024-02-27 DIAGNOSIS — E119 Type 2 diabetes mellitus without complications: Secondary | ICD-10-CM

## 2024-02-27 DIAGNOSIS — J4530 Mild persistent asthma, uncomplicated: Secondary | ICD-10-CM

## 2024-02-27 DIAGNOSIS — Z85038 Personal history of other malignant neoplasm of large intestine: Secondary | ICD-10-CM

## 2024-03-02 ENCOUNTER — Ambulatory Visit: Payer: Self-pay

## 2024-03-03 ENCOUNTER — Ambulatory Visit: Admitting: Internal Medicine

## 2024-03-03 NOTE — Telephone Encounter (Signed)
 Noted.

## 2024-03-11 ENCOUNTER — Ambulatory Visit: Admitting: Podiatry

## 2024-03-18 ENCOUNTER — Ambulatory Visit: Admitting: Internal Medicine

## 2024-04-07 ENCOUNTER — Ambulatory Visit: Payer: PRIVATE HEALTH INSURANCE | Admitting: Family Medicine

## 2024-04-08 ENCOUNTER — Ambulatory Visit: Admitting: Neurology

## 2024-05-05 ENCOUNTER — Ambulatory Visit

## 2024-05-29 ENCOUNTER — Ambulatory Visit
